# Patient Record
Sex: Female | Born: 1980 | Race: Black or African American | Hispanic: No | Marital: Single | Smoking: Never smoker
Health system: Southern US, Community
[De-identification: ages and names within clinical notes are randomized; demographics above are authoritative.]

## PROBLEM LIST (undated history)

## (undated) DIAGNOSIS — F329 Major depressive disorder, single episode, unspecified: Secondary | ICD-10-CM

## (undated) DIAGNOSIS — F988 Other specified behavioral and emotional disorders with onset usually occurring in childhood and adolescence: Secondary | ICD-10-CM

## (undated) DIAGNOSIS — I639 Cerebral infarction, unspecified: Secondary | ICD-10-CM

## (undated) DIAGNOSIS — D649 Anemia, unspecified: Secondary | ICD-10-CM

## (undated) DIAGNOSIS — F32A Depression, unspecified: Secondary | ICD-10-CM

## (undated) DIAGNOSIS — Z9151 Personal history of suicidal behavior: Secondary | ICD-10-CM

## (undated) DIAGNOSIS — F909 Attention-deficit hyperactivity disorder, unspecified type: Secondary | ICD-10-CM

## (undated) DIAGNOSIS — R569 Unspecified convulsions: Secondary | ICD-10-CM

## (undated) DIAGNOSIS — Z915 Personal history of self-harm: Secondary | ICD-10-CM

## (undated) DIAGNOSIS — F4310 Post-traumatic stress disorder, unspecified: Secondary | ICD-10-CM

## (undated) DIAGNOSIS — F419 Anxiety disorder, unspecified: Secondary | ICD-10-CM

## (undated) HISTORY — DX: Cerebral infarction, unspecified: I63.9

## (undated) HISTORY — DX: Unspecified convulsions: R56.9

## (undated) HISTORY — DX: Attention-deficit hyperactivity disorder, unspecified type: F90.9

---

## 2016-02-09 ENCOUNTER — Emergency Department (HOSPITAL_COMMUNITY): Payer: Medicaid Other | Admitting: Anesthesiology

## 2016-02-09 ENCOUNTER — Inpatient Hospital Stay (HOSPITAL_COMMUNITY): Payer: Medicaid Other

## 2016-02-09 ENCOUNTER — Emergency Department (HOSPITAL_COMMUNITY): Payer: Medicaid Other

## 2016-02-09 ENCOUNTER — Encounter (HOSPITAL_COMMUNITY): Admission: EM | Disposition: A | Discharge status: Rehabilitation Facility | Payer: Self-pay | Source: Home / Self Care

## 2016-02-09 ENCOUNTER — Inpatient Hospital Stay (HOSPITAL_COMMUNITY)
Admission: EM | Admit: 2016-02-09 | Discharge: 2016-06-03 | DRG: 003 | Disposition: A | Discharge status: Rehabilitation Facility | Payer: Medicaid Other | Attending: Gastroenterology | Admitting: Gastroenterology

## 2016-02-09 ENCOUNTER — Encounter (HOSPITAL_COMMUNITY): Payer: Self-pay | Admitting: Emergency Medicine

## 2016-02-09 DIAGNOSIS — Y95 Nosocomial condition: Secondary | ICD-10-CM | POA: Diagnosis not present

## 2016-02-09 DIAGNOSIS — S3282XA Multiple fractures of pelvis without disruption of pelvic ring, initial encounter for closed fracture: Principal | ICD-10-CM | POA: Diagnosis present

## 2016-02-09 DIAGNOSIS — S36409A Unspecified injury of unspecified part of small intestine, initial encounter: Secondary | ICD-10-CM | POA: Diagnosis present

## 2016-02-09 DIAGNOSIS — J90 Pleural effusion, not elsewhere classified: Secondary | ICD-10-CM | POA: Diagnosis not present

## 2016-02-09 DIAGNOSIS — J9811 Atelectasis: Secondary | ICD-10-CM | POA: Diagnosis not present

## 2016-02-09 DIAGNOSIS — N179 Acute kidney failure, unspecified: Secondary | ICD-10-CM | POA: Diagnosis present

## 2016-02-09 DIAGNOSIS — T5491XA Toxic effect of unspecified corrosive substance, accidental (unintentional), initial encounter: Secondary | ICD-10-CM | POA: Diagnosis present

## 2016-02-09 DIAGNOSIS — Y9259 Other trade areas as the place of occurrence of the external cause: Secondary | ICD-10-CM

## 2016-02-09 DIAGNOSIS — S3282XS Multiple fractures of pelvis without disruption of pelvic ring, sequela: Secondary | ICD-10-CM | POA: Diagnosis not present

## 2016-02-09 DIAGNOSIS — Z6824 Body mass index (BMI) 24.0-24.9, adult: Secondary | ICD-10-CM

## 2016-02-09 DIAGNOSIS — S79912A Unspecified injury of left hip, initial encounter: Secondary | ICD-10-CM

## 2016-02-09 DIAGNOSIS — S90511A Abrasion, right ankle, initial encounter: Secondary | ICD-10-CM | POA: Diagnosis present

## 2016-02-09 DIAGNOSIS — T1490XA Injury, unspecified, initial encounter: Secondary | ICD-10-CM

## 2016-02-09 DIAGNOSIS — S2241XA Multiple fractures of ribs, right side, initial encounter for closed fracture: Secondary | ICD-10-CM | POA: Diagnosis present

## 2016-02-09 DIAGNOSIS — Z9289 Personal history of other medical treatment: Secondary | ICD-10-CM

## 2016-02-09 DIAGNOSIS — S271XXA Traumatic hemothorax, initial encounter: Secondary | ICD-10-CM | POA: Diagnosis present

## 2016-02-09 DIAGNOSIS — I6340 Cerebral infarction due to embolism of unspecified cerebral artery: Secondary | ICD-10-CM

## 2016-02-09 DIAGNOSIS — Z9189 Other specified personal risk factors, not elsewhere classified: Secondary | ICD-10-CM | POA: Diagnosis not present

## 2016-02-09 DIAGNOSIS — S2242XA Multiple fractures of ribs, left side, initial encounter for closed fracture: Secondary | ICD-10-CM | POA: Diagnosis present

## 2016-02-09 DIAGNOSIS — M79671 Pain in right foot: Secondary | ICD-10-CM

## 2016-02-09 DIAGNOSIS — T797XXA Traumatic subcutaneous emphysema, initial encounter: Secondary | ICD-10-CM

## 2016-02-09 DIAGNOSIS — S069X3S Unspecified intracranial injury with loss of consciousness of 1 hour to 5 hours 59 minutes, sequela: Secondary | ICD-10-CM | POA: Diagnosis not present

## 2016-02-09 DIAGNOSIS — S32810A Multiple fractures of pelvis with stable disruption of pelvic ring, initial encounter for closed fracture: Secondary | ICD-10-CM

## 2016-02-09 DIAGNOSIS — I272 Other secondary pulmonary hypertension: Secondary | ICD-10-CM | POA: Diagnosis not present

## 2016-02-09 DIAGNOSIS — X80XXXA Intentional self-harm by jumping from a high place, initial encounter: Secondary | ICD-10-CM | POA: Diagnosis present

## 2016-02-09 DIAGNOSIS — M21379 Foot drop, unspecified foot: Secondary | ICD-10-CM | POA: Diagnosis not present

## 2016-02-09 DIAGNOSIS — S62102A Fracture of unspecified carpal bone, left wrist, initial encounter for closed fracture: Secondary | ICD-10-CM | POA: Diagnosis present

## 2016-02-09 DIAGNOSIS — S62002A Unspecified fracture of navicular [scaphoid] bone of left wrist, initial encounter for closed fracture: Secondary | ICD-10-CM | POA: Diagnosis present

## 2016-02-09 DIAGNOSIS — Z4682 Encounter for fitting and adjustment of non-vascular catheter: Secondary | ICD-10-CM

## 2016-02-09 DIAGNOSIS — B999 Unspecified infectious disease: Secondary | ICD-10-CM

## 2016-02-09 DIAGNOSIS — R4182 Altered mental status, unspecified: Secondary | ICD-10-CM

## 2016-02-09 DIAGNOSIS — E876 Hypokalemia: Secondary | ICD-10-CM | POA: Diagnosis not present

## 2016-02-09 DIAGNOSIS — Z87891 Personal history of nicotine dependence: Secondary | ICD-10-CM

## 2016-02-09 DIAGNOSIS — D6862 Lupus anticoagulant syndrome: Secondary | ICD-10-CM | POA: Diagnosis present

## 2016-02-09 DIAGNOSIS — J9601 Acute respiratory failure with hypoxia: Secondary | ICD-10-CM | POA: Diagnosis not present

## 2016-02-09 DIAGNOSIS — R739 Hyperglycemia, unspecified: Secondary | ICD-10-CM | POA: Diagnosis present

## 2016-02-09 DIAGNOSIS — D6489 Other specified anemias: Secondary | ICD-10-CM | POA: Diagnosis present

## 2016-02-09 DIAGNOSIS — I619 Nontraumatic intracerebral hemorrhage, unspecified: Secondary | ICD-10-CM

## 2016-02-09 DIAGNOSIS — M79604 Pain in right leg: Secondary | ICD-10-CM

## 2016-02-09 DIAGNOSIS — Z09 Encounter for follow-up examination after completed treatment for conditions other than malignant neoplasm: Secondary | ICD-10-CM

## 2016-02-09 DIAGNOSIS — J189 Pneumonia, unspecified organism: Secondary | ICD-10-CM | POA: Diagnosis not present

## 2016-02-09 DIAGNOSIS — T1491XA Suicide attempt, initial encounter: Secondary | ICD-10-CM | POA: Diagnosis present

## 2016-02-09 DIAGNOSIS — E872 Acidosis: Secondary | ICD-10-CM | POA: Diagnosis not present

## 2016-02-09 DIAGNOSIS — W19XXXA Unspecified fall, initial encounter: Secondary | ICD-10-CM

## 2016-02-09 DIAGNOSIS — F332 Major depressive disorder, recurrent severe without psychotic features: Secondary | ICD-10-CM | POA: Diagnosis present

## 2016-02-09 DIAGNOSIS — D696 Thrombocytopenia, unspecified: Secondary | ICD-10-CM | POA: Diagnosis present

## 2016-02-09 DIAGNOSIS — G0490 Encephalitis and encephalomyelitis, unspecified: Secondary | ICD-10-CM

## 2016-02-09 DIAGNOSIS — J9690 Respiratory failure, unspecified, unspecified whether with hypoxia or hypercapnia: Secondary | ICD-10-CM

## 2016-02-09 DIAGNOSIS — D62 Acute posthemorrhagic anemia: Secondary | ICD-10-CM | POA: Diagnosis present

## 2016-02-09 DIAGNOSIS — S329XXA Fracture of unspecified parts of lumbosacral spine and pelvis, initial encounter for closed fracture: Secondary | ICD-10-CM

## 2016-02-09 DIAGNOSIS — S2231XA Fracture of one rib, right side, initial encounter for closed fracture: Secondary | ICD-10-CM

## 2016-02-09 DIAGNOSIS — W139XXA Fall from, out of or through building, not otherwise specified, initial encounter: Secondary | ICD-10-CM | POA: Diagnosis not present

## 2016-02-09 DIAGNOSIS — S51812A Laceration without foreign body of left forearm, initial encounter: Secondary | ICD-10-CM | POA: Diagnosis present

## 2016-02-09 DIAGNOSIS — I639 Cerebral infarction, unspecified: Secondary | ICD-10-CM | POA: Diagnosis not present

## 2016-02-09 DIAGNOSIS — T148XXA Other injury of unspecified body region, initial encounter: Secondary | ICD-10-CM

## 2016-02-09 DIAGNOSIS — K5900 Constipation, unspecified: Secondary | ICD-10-CM | POA: Diagnosis not present

## 2016-02-09 DIAGNOSIS — Z91410 Personal history of adult physical and sexual abuse: Secondary | ICD-10-CM

## 2016-02-09 DIAGNOSIS — F4310 Post-traumatic stress disorder, unspecified: Secondary | ICD-10-CM | POA: Diagnosis present

## 2016-02-09 DIAGNOSIS — I959 Hypotension, unspecified: Secondary | ICD-10-CM

## 2016-02-09 DIAGNOSIS — R319 Hematuria, unspecified: Secondary | ICD-10-CM | POA: Diagnosis present

## 2016-02-09 DIAGNOSIS — S36893A Laceration of other intra-abdominal organs, initial encounter: Secondary | ICD-10-CM | POA: Diagnosis present

## 2016-02-09 DIAGNOSIS — I63131 Cerebral infarction due to embolism of right carotid artery: Secondary | ICD-10-CM | POA: Diagnosis not present

## 2016-02-09 DIAGNOSIS — S27331A Laceration of lung, unilateral, initial encounter: Secondary | ICD-10-CM | POA: Diagnosis present

## 2016-02-09 DIAGNOSIS — J869 Pyothorax without fistula: Secondary | ICD-10-CM

## 2016-02-09 DIAGNOSIS — I472 Ventricular tachycardia: Secondary | ICD-10-CM | POA: Diagnosis not present

## 2016-02-09 DIAGNOSIS — S36116A Major laceration of liver, initial encounter: Secondary | ICD-10-CM | POA: Diagnosis present

## 2016-02-09 DIAGNOSIS — S62315A Displaced fracture of base of fourth metacarpal bone, left hand, initial encounter for closed fracture: Secondary | ICD-10-CM | POA: Diagnosis present

## 2016-02-09 DIAGNOSIS — M25471 Effusion, right ankle: Secondary | ICD-10-CM

## 2016-02-09 DIAGNOSIS — J156 Pneumonia due to other aerobic Gram-negative bacteria: Secondary | ICD-10-CM | POA: Diagnosis not present

## 2016-02-09 DIAGNOSIS — N170 Acute kidney failure with tubular necrosis: Secondary | ICD-10-CM | POA: Diagnosis not present

## 2016-02-09 DIAGNOSIS — R571 Hypovolemic shock: Secondary | ICD-10-CM | POA: Diagnosis present

## 2016-02-09 DIAGNOSIS — E870 Hyperosmolality and hypernatremia: Secondary | ICD-10-CM | POA: Diagnosis not present

## 2016-02-09 DIAGNOSIS — E669 Obesity, unspecified: Secondary | ICD-10-CM | POA: Diagnosis present

## 2016-02-09 DIAGNOSIS — K259 Gastric ulcer, unspecified as acute or chronic, without hemorrhage or perforation: Secondary | ICD-10-CM | POA: Diagnosis present

## 2016-02-09 DIAGNOSIS — R531 Weakness: Secondary | ICD-10-CM

## 2016-02-09 DIAGNOSIS — R1310 Dysphagia, unspecified: Secondary | ICD-10-CM | POA: Diagnosis not present

## 2016-02-09 DIAGNOSIS — T391X1A Poisoning by 4-Aminophenol derivatives, accidental (unintentional), initial encounter: Secondary | ICD-10-CM | POA: Diagnosis present

## 2016-02-09 DIAGNOSIS — T65892A Toxic effect of other specified substances, intentional self-harm, initial encounter: Secondary | ICD-10-CM | POA: Diagnosis present

## 2016-02-09 DIAGNOSIS — Y900 Blood alcohol level of less than 20 mg/100 ml: Secondary | ICD-10-CM | POA: Diagnosis present

## 2016-02-09 DIAGNOSIS — Z9889 Other specified postprocedural states: Secondary | ICD-10-CM | POA: Diagnosis not present

## 2016-02-09 DIAGNOSIS — S299XXA Unspecified injury of thorax, initial encounter: Secondary | ICD-10-CM

## 2016-02-09 DIAGNOSIS — J939 Pneumothorax, unspecified: Secondary | ICD-10-CM

## 2016-02-09 DIAGNOSIS — I638 Other cerebral infarction: Secondary | ICD-10-CM | POA: Diagnosis not present

## 2016-02-09 DIAGNOSIS — I63411 Cerebral infarction due to embolism of right middle cerebral artery: Secondary | ICD-10-CM | POA: Diagnosis not present

## 2016-02-09 DIAGNOSIS — L8990 Pressure ulcer of unspecified site, unspecified stage: Secondary | ICD-10-CM | POA: Diagnosis not present

## 2016-02-09 DIAGNOSIS — Z978 Presence of other specified devices: Secondary | ICD-10-CM

## 2016-02-09 DIAGNOSIS — R600 Localized edema: Secondary | ICD-10-CM | POA: Diagnosis not present

## 2016-02-09 DIAGNOSIS — R0902 Hypoxemia: Secondary | ICD-10-CM

## 2016-02-09 DIAGNOSIS — S2249XA Multiple fractures of ribs, unspecified side, initial encounter for closed fracture: Secondary | ICD-10-CM

## 2016-02-09 DIAGNOSIS — I63412 Cerebral infarction due to embolism of left middle cerebral artery: Secondary | ICD-10-CM | POA: Diagnosis not present

## 2016-02-09 DIAGNOSIS — Z9911 Dependence on respirator [ventilator] status: Secondary | ICD-10-CM

## 2016-02-09 DIAGNOSIS — R4020 Unspecified coma: Secondary | ICD-10-CM | POA: Diagnosis not present

## 2016-02-09 DIAGNOSIS — T1491 Suicide attempt: Secondary | ICD-10-CM | POA: Diagnosis not present

## 2016-02-09 DIAGNOSIS — G40901 Epilepsy, unspecified, not intractable, with status epilepticus: Secondary | ICD-10-CM | POA: Diagnosis not present

## 2016-02-09 DIAGNOSIS — R001 Bradycardia, unspecified: Secondary | ICD-10-CM

## 2016-02-09 DIAGNOSIS — T391X2A Poisoning by 4-Aminophenol derivatives, intentional self-harm, initial encounter: Secondary | ICD-10-CM | POA: Diagnosis present

## 2016-02-09 DIAGNOSIS — S36113A Laceration of liver, unspecified degree, initial encounter: Secondary | ICD-10-CM | POA: Diagnosis present

## 2016-02-09 DIAGNOSIS — D689 Coagulation defect, unspecified: Secondary | ICD-10-CM | POA: Diagnosis present

## 2016-02-09 DIAGNOSIS — E2740 Unspecified adrenocortical insufficiency: Secondary | ICD-10-CM | POA: Diagnosis present

## 2016-02-09 DIAGNOSIS — J95811 Postprocedural pneumothorax: Secondary | ICD-10-CM | POA: Diagnosis not present

## 2016-02-09 DIAGNOSIS — E8770 Fluid overload, unspecified: Secondary | ICD-10-CM | POA: Diagnosis not present

## 2016-02-09 DIAGNOSIS — I63421 Cerebral infarction due to embolism of right anterior cerebral artery: Secondary | ICD-10-CM | POA: Diagnosis not present

## 2016-02-09 DIAGNOSIS — R569 Unspecified convulsions: Secondary | ICD-10-CM | POA: Diagnosis not present

## 2016-02-09 DIAGNOSIS — R0602 Shortness of breath: Secondary | ICD-10-CM

## 2016-02-09 DIAGNOSIS — S3210XA Unspecified fracture of sacrum, initial encounter for closed fracture: Secondary | ICD-10-CM | POA: Diagnosis present

## 2016-02-09 DIAGNOSIS — S27322A Contusion of lung, bilateral, initial encounter: Secondary | ICD-10-CM | POA: Diagnosis present

## 2016-02-09 DIAGNOSIS — R45851 Suicidal ideations: Secondary | ICD-10-CM | POA: Diagnosis not present

## 2016-02-09 DIAGNOSIS — E785 Hyperlipidemia, unspecified: Secondary | ICD-10-CM | POA: Diagnosis not present

## 2016-02-09 DIAGNOSIS — S272XXA Traumatic hemopneumothorax, initial encounter: Secondary | ICD-10-CM

## 2016-02-09 DIAGNOSIS — G8194 Hemiplegia, unspecified affecting left nondominant side: Secondary | ICD-10-CM | POA: Diagnosis not present

## 2016-02-09 DIAGNOSIS — I10 Essential (primary) hypertension: Secondary | ICD-10-CM | POA: Diagnosis not present

## 2016-02-09 DIAGNOSIS — J9600 Acute respiratory failure, unspecified whether with hypoxia or hypercapnia: Secondary | ICD-10-CM | POA: Diagnosis present

## 2016-02-09 DIAGNOSIS — L89899 Pressure ulcer of other site, unspecified stage: Secondary | ICD-10-CM | POA: Diagnosis not present

## 2016-02-09 DIAGNOSIS — X789XXA Intentional self-harm by unspecified sharp object, initial encounter: Secondary | ICD-10-CM | POA: Diagnosis present

## 2016-02-09 DIAGNOSIS — Z931 Gastrostomy status: Secondary | ICD-10-CM

## 2016-02-09 DIAGNOSIS — Z9689 Presence of other specified functional implants: Secondary | ICD-10-CM

## 2016-02-09 DIAGNOSIS — I63139 Cerebral infarction due to embolism of unspecified carotid artery: Secondary | ICD-10-CM | POA: Diagnosis not present

## 2016-02-09 DIAGNOSIS — K7290 Hepatic failure, unspecified without coma: Secondary | ICD-10-CM | POA: Diagnosis present

## 2016-02-09 DIAGNOSIS — M79606 Pain in leg, unspecified: Secondary | ICD-10-CM

## 2016-02-09 DIAGNOSIS — N280 Ischemia and infarction of kidney: Secondary | ICD-10-CM | POA: Diagnosis present

## 2016-02-09 DIAGNOSIS — J181 Lobar pneumonia, unspecified organism: Secondary | ICD-10-CM

## 2016-02-09 DIAGNOSIS — J949 Pleural condition, unspecified: Secondary | ICD-10-CM

## 2016-02-09 DIAGNOSIS — L02213 Cutaneous abscess of chest wall: Secondary | ICD-10-CM | POA: Diagnosis not present

## 2016-02-09 DIAGNOSIS — Z6839 Body mass index (BMI) 39.0-39.9, adult: Secondary | ICD-10-CM

## 2016-02-09 DIAGNOSIS — T5494XA Toxic effect of unspecified corrosive substance, undetermined, initial encounter: Secondary | ICD-10-CM | POA: Diagnosis not present

## 2016-02-09 DIAGNOSIS — S62009A Unspecified fracture of navicular [scaphoid] bone of unspecified wrist, initial encounter for closed fracture: Secondary | ICD-10-CM

## 2016-02-09 DIAGNOSIS — R4701 Aphasia: Secondary | ICD-10-CM | POA: Diagnosis not present

## 2016-02-09 DIAGNOSIS — T5492XA Toxic effect of unspecified corrosive substance, intentional self-harm, initial encounter: Secondary | ICD-10-CM | POA: Diagnosis not present

## 2016-02-09 DIAGNOSIS — J942 Hemothorax: Secondary | ICD-10-CM

## 2016-02-09 DIAGNOSIS — T543X2A Toxic effect of corrosive alkalis and alkali-like substances, intentional self-harm, initial encounter: Secondary | ICD-10-CM | POA: Diagnosis present

## 2016-02-09 HISTORY — DX: Depression, unspecified: F32.A

## 2016-02-09 HISTORY — PX: CHOLECYSTECTOMY: SHX55

## 2016-02-09 HISTORY — PX: EXTERNAL FIXATION PELVIS: SHX1551

## 2016-02-09 HISTORY — DX: Anxiety disorder, unspecified: F41.9

## 2016-02-09 HISTORY — DX: Post-traumatic stress disorder, unspecified: F43.10

## 2016-02-09 HISTORY — DX: Other specified behavioral and emotional disorders with onset usually occurring in childhood and adolescence: F98.8

## 2016-02-09 HISTORY — DX: Anemia, unspecified: D64.9

## 2016-02-09 HISTORY — PX: LACERATION REPAIR: SHX5284

## 2016-02-09 HISTORY — DX: Personal history of self-harm: Z91.5

## 2016-02-09 HISTORY — DX: Personal history of suicidal behavior: Z91.51

## 2016-02-09 HISTORY — PX: LAPAROTOMY: SHX154

## 2016-02-09 HISTORY — DX: Major depressive disorder, single episode, unspecified: F32.9

## 2016-02-09 HISTORY — PX: BOWEL RESECTION: SHX1257

## 2016-02-09 HISTORY — PX: APPLICATION OF WOUND VAC: SHX5189

## 2016-02-09 LAB — CBC
HCT: 27.6 % — ABNORMAL LOW (ref 36.0–46.0)
HEMATOCRIT: 10.7 % — AB (ref 36.0–46.0)
HEMATOCRIT: 11.6 % — AB (ref 36.0–46.0)
HEMOGLOBIN: 8 g/dL — AB (ref 12.0–15.0)
HEMOGLOBIN: 3.6 g/dL — AB (ref 12.0–15.0)
HEMOGLOBIN: 3.8 g/dL — AB (ref 12.0–15.0)
MCH: 21.6 pg — AB (ref 26.0–34.0)
MCH: 27.1 pg (ref 26.0–34.0)
MCH: 27.9 pg (ref 26.0–34.0)
MCHC: 29 g/dL — AB (ref 30.0–36.0)
MCHC: 32.8 g/dL (ref 30.0–36.0)
MCHC: 33.6 g/dL (ref 30.0–36.0)
MCV: 74.4 fL — ABNORMAL LOW (ref 78.0–100.0)
MCV: 82.9 fL (ref 78.0–100.0)
MCV: 82.9 fL (ref 78.0–100.0)
PLATELETS: 59 10*3/uL — AB (ref 150–400)
Platelets: 307 10*3/uL (ref 150–400)
Platelets: 78 10*3/uL — ABNORMAL LOW (ref 150–400)
RBC: 3.71 MIL/uL — ABNORMAL LOW (ref 3.87–5.11)
RBC: 1.29 MIL/uL — AB (ref 3.87–5.11)
RBC: 1.4 MIL/uL — AB (ref 3.87–5.11)
RDW: 15.5 % (ref 11.5–15.5)
RDW: 14.4 % (ref 11.5–15.5)
RDW: 14.5 % (ref 11.5–15.5)
WBC: 11 10*3/uL — ABNORMAL HIGH (ref 4.0–10.5)
WBC: 3.1 10*3/uL — ABNORMAL LOW (ref 4.0–10.5)
WBC: 3.5 10*3/uL — ABNORMAL LOW (ref 4.0–10.5)

## 2016-02-09 LAB — DIC (DISSEMINATED INTRAVASCULAR COAGULATION) PANEL
APTT: 80 s — AB (ref 24–37)
FIBRINOGEN: 143 mg/dL — AB (ref 204–475)
PLATELETS: 87 10*3/uL — AB (ref 150–400)
SMEAR REVIEW: NONE SEEN

## 2016-02-09 LAB — POCT I-STAT 4, (NA,K, GLUC, HGB,HCT)
GLUCOSE: 368 mg/dL — AB (ref 65–99)
HCT: 21 % — ABNORMAL LOW (ref 36.0–46.0)
Hemoglobin: 7.1 g/dL — ABNORMAL LOW (ref 12.0–15.0)
Potassium: 5.2 mmol/L — ABNORMAL HIGH (ref 3.5–5.1)
SODIUM: 146 mmol/L — AB (ref 135–145)

## 2016-02-09 LAB — COMPREHENSIVE METABOLIC PANEL
ALK PHOS: 43 U/L (ref 38–126)
ALK PHOS: 35 U/L — AB (ref 38–126)
ALT: 456 U/L — AB (ref 14–54)
ALT: 144 U/L — ABNORMAL HIGH (ref 14–54)
ANION GAP: 12 (ref 5–15)
AST: 595 U/L — ABNORMAL HIGH (ref 15–41)
AST: 208 U/L — ABNORMAL HIGH (ref 15–41)
Albumin: 3.2 g/dL — ABNORMAL LOW (ref 3.5–5.0)
Albumin: 2.1 g/dL — ABNORMAL LOW (ref 3.5–5.0)
Anion gap: 22 — ABNORMAL HIGH (ref 5–15)
BUN: 6 mg/dL (ref 6–20)
CALCIUM: 8.3 mg/dL — AB (ref 8.9–10.3)
CALCIUM: 6.6 mg/dL — AB (ref 8.9–10.3)
CHLORIDE: 116 mmol/L — AB (ref 101–111)
CO2: 10 mmol/L — AB (ref 22–32)
CO2: 18 mmol/L — AB (ref 22–32)
CREATININE: 1.15 mg/dL — AB (ref 0.44–1.00)
Chloride: 106 mmol/L (ref 101–111)
Creatinine, Ser: 0.98 mg/dL (ref 0.44–1.00)
GFR calc Af Amer: >60 mL/min (ref >60)
GFR calc non Af Amer: >60 mL/min (ref >60)
GFR calc non Af Amer: >60 mL/min (ref >60)
GLUCOSE: 324 mg/dL — AB (ref 65–99)
Glucose, Bld: 248 mg/dL — ABNORMAL HIGH (ref 65–99)
POTASSIUM: 4.2 mmol/L (ref 3.5–5.1)
Potassium: 5 mmol/L (ref 3.5–5.1)
SODIUM: 146 mmol/L — AB (ref 135–145)
Sodium: 138 mmol/L (ref 135–145)
Total Bilirubin: 0.7 mg/dL (ref 0.3–1.2)
Total Bilirubin: 0.6 mg/dL (ref 0.3–1.2)
Total Protein: 5.9 g/dL — ABNORMAL LOW (ref 6.5–8.1)
Total Protein: 3.8 g/dL — ABNORMAL LOW (ref 6.5–8.1)

## 2016-02-09 LAB — PROTIME-INR
INR: 1.6 — ABNORMAL HIGH (ref 0.00–1.49)
INR: 1.99 — ABNORMAL HIGH (ref 0.00–1.49)
INR: 2.07 — ABNORMAL HIGH (ref 0.00–1.49)
INR: 1.83 — ABNORMAL HIGH (ref 0.00–1.49)
INR: 2 — ABNORMAL HIGH (ref 0.00–1.49)
Prothrombin Time: 19.1 seconds — ABNORMAL HIGH (ref 11.6–15.2)
Prothrombin Time: 22.5 seconds — ABNORMAL HIGH (ref 11.6–15.2)
Prothrombin Time: 23.1 seconds — ABNORMAL HIGH (ref 11.6–15.2)
Prothrombin Time: 21.1 seconds — ABNORMAL HIGH (ref 11.6–15.2)
Prothrombin Time: 22.5 seconds — ABNORMAL HIGH (ref 11.6–15.2)

## 2016-02-09 LAB — BLOOD GAS, ARTERIAL
Acid-base deficit: 9.6 mmol/L — ABNORMAL HIGH (ref 0.0–2.0)
Acid-base deficit: 8.2 mmol/L — ABNORMAL HIGH (ref 0.0–2.0)
Bicarbonate: 18.8 mEq/L — ABNORMAL LOW (ref 20.0–24.0)
Bicarbonate: 18.9 mEq/L — ABNORMAL LOW (ref 20.0–24.0)
DRAWN BY: 280981
Drawn by: 280981
FIO2: 0.9
FIO2: 0.8
MECHVT: 550 mL
MECHVT: 550 mL
O2 SAT: 98.6 %
O2 SAT: 98.7 %
PATIENT TEMPERATURE: 98.6
PATIENT TEMPERATURE: 98.6
PCO2 ART: 65.5 mmHg — AB (ref 35.0–45.0)
PCO2 ART: 53.2 mmHg — AB (ref 35.0–45.0)
PEEP: 5 cmH2O
PEEP: 5 cmH2O
PH ART: 7.175 — AB (ref 7.350–7.450)
PO2 ART: 145 mmHg — AB (ref 80.0–100.0)
RATE: 16 resp/min
RATE: 22 resp/min
TCO2: 20.8 mmol/L (ref 0–100)
TCO2: 20.5 mmol/L (ref 0–100)
pH, Arterial: 7.086 — CL (ref 7.350–7.450)
pO2, Arterial: 129 mmHg — ABNORMAL HIGH (ref 80.0–100.0)

## 2016-02-09 LAB — I-STAT CHEM 8, ED
BUN: 4 mg/dL — AB (ref 6–20)
CREATININE: 1.2 mg/dL — AB (ref 0.44–1.00)
Calcium, Ion: 1.03 mmol/L — ABNORMAL LOW (ref 1.12–1.23)
Chloride: 107 mmol/L (ref 101–111)
GLUCOSE: 310 mg/dL — AB (ref 65–99)
HEMATOCRIT: 28 % — AB (ref 36.0–46.0)
Hemoglobin: 9.5 g/dL — ABNORMAL LOW (ref 12.0–15.0)
Potassium: 5 mmol/L (ref 3.5–5.1)
Sodium: 138 mmol/L (ref 135–145)
TCO2: 12 mmol/L (ref 0–100)

## 2016-02-09 LAB — HEMOGLOBIN AND HEMATOCRIT, BLOOD
HCT: 24.1 % — ABNORMAL LOW (ref 36.0–46.0)
HCT: 30.4 % — ABNORMAL LOW (ref 36.0–46.0)
HEMATOCRIT: 30.7 % — AB (ref 36.0–46.0)
HEMOGLOBIN: 9.9 g/dL — AB (ref 12.0–15.0)
Hemoglobin: 7.8 g/dL — ABNORMAL LOW (ref 12.0–15.0)
Hemoglobin: 10 g/dL — ABNORMAL LOW (ref 12.0–15.0)

## 2016-02-09 LAB — POCT I-STAT 3, ART BLOOD GAS (G3+)
ACID-BASE DEFICIT: 9 mmol/L — AB (ref 0.0–2.0)
Bicarbonate: 19.3 mEq/L — ABNORMAL LOW (ref 20.0–24.0)
O2 Saturation: 76 %
PCO2 ART: 59.7 mmHg — AB (ref 35.0–45.0)
PO2 ART: 56 mmHg — AB (ref 80.0–100.0)
Patient temperature: 37.4
TCO2: 21 mmol/L (ref 0–100)
pH, Arterial: 7.119 — CL (ref 7.350–7.450)

## 2016-02-09 LAB — POCT I-STAT 7, (LYTES, BLD GAS, ICA,H+H)
ACID-BASE DEFICIT: 23 mmol/L — AB (ref 0.0–2.0)
BICARBONATE: 8.3 meq/L — AB (ref 20.0–24.0)
CALCIUM ION: 0.71 mmol/L — AB (ref 1.12–1.23)
HCT: 17 % — ABNORMAL LOW (ref 36.0–46.0)
Hemoglobin: 5.8 g/dL — CL (ref 12.0–15.0)
O2 SAT: 96 %
PCO2 ART: 44.1 mmHg (ref 35.0–45.0)
PH ART: 6.863 — AB (ref 7.350–7.450)
PO2 ART: 131 mmHg — AB (ref 80.0–100.0)
Potassium: 4.3 mmol/L (ref 3.5–5.1)
Sodium: 142 mmol/L (ref 135–145)
TCO2: 10 mmol/L (ref 0–100)

## 2016-02-09 LAB — DIC (DISSEMINATED INTRAVASCULAR COAGULATION)PANEL
D-Dimer, Quant: 13.37 ug/mL-FEU — ABNORMAL HIGH (ref 0.00–0.50)
INR: 2.11 — ABNORMAL HIGH (ref 0.00–1.49)
Prothrombin Time: 23.5 seconds — ABNORMAL HIGH (ref 11.6–15.2)

## 2016-02-09 LAB — LACTIC ACID, PLASMA: LACTIC ACID, VENOUS: 5.7 mmol/L — AB (ref 0.5–2.0)

## 2016-02-09 LAB — MASSIVE TRANSFUSION PROTOCOL ORDER (BLOOD BANK NOTIFICATION)

## 2016-02-09 LAB — FIBRINOGEN
FIBRINOGEN: 199 mg/dL — AB (ref 204–475)
FIBRINOGEN: 234 mg/dL (ref 204–475)
Fibrinogen: 168 mg/dL — ABNORMAL LOW (ref 204–475)

## 2016-02-09 LAB — ABO/RH: ABO/RH(D): O POS

## 2016-02-09 LAB — PREPARE RBC (CROSSMATCH)

## 2016-02-09 LAB — PLATELET COUNT: PLATELETS: 87 10*3/uL — AB (ref 150–400)

## 2016-02-09 LAB — CDS SEROLOGY

## 2016-02-09 LAB — APTT: aPTT: 63 seconds — ABNORMAL HIGH (ref 24–37)

## 2016-02-09 LAB — ETHANOL: ALCOHOL ETHYL (B): 6 mg/dL — AB (ref <5)

## 2016-02-09 LAB — TRIGLYCERIDES: TRIGLYCERIDES: 91 mg/dL (ref <150)

## 2016-02-09 SURGERY — LAPAROTOMY, EXPLORATORY
Anesthesia: General | Site: Pelvis

## 2016-02-09 MED ORDER — SODIUM CHLORIDE 0.9 % IV SOLN: Freq: Once | INTRAVENOUS | Status: DC

## 2016-02-09 MED ORDER — PANTOPRAZOLE SODIUM 40 MG IV SOLR
40.0000 mg | Freq: Every day | INTRAVENOUS | Status: DC
  Administered 2016-02-09 – 2016-03-19 (×39): 40 mg via INTRAVENOUS
  Filled 2016-02-09 (×40): qty 40

## 2016-02-09 MED ORDER — SODIUM CHLORIDE 0.9 % IV BOLUS (SEPSIS)
1000.0000 mL | Freq: Once | INTRAVENOUS | Status: AC
  Administered 2016-02-09: 1000 mL via INTRAVENOUS

## 2016-02-09 MED ORDER — SODIUM CHLORIDE 0.9 % IV SOLN
INTRAVENOUS | Status: DC | PRN
Start: 2016-02-09 — End: 2016-02-09
  Administered 2016-02-09 (×3): via INTRAVENOUS

## 2016-02-09 MED ORDER — PANTOPRAZOLE SODIUM 40 MG PO TBEC
40.0000 mg | DELAYED_RELEASE_TABLET | Freq: Every day | ORAL | Status: DC
  Administered 2016-03-12 – 2016-03-21 (×3): 40 mg via ORAL
  Filled 2016-02-09 (×4): qty 1

## 2016-02-09 MED ORDER — ONDANSETRON HCL 4 MG PO TABS: 4.0000 mg | ORAL_TABLET | Freq: Four times a day (QID) | ORAL | Status: DC | PRN

## 2016-02-09 MED ORDER — LIDOCAINE-EPINEPHRINE (PF) 2 %-1:200000 IJ SOLN
INTRAMUSCULAR | Status: AC
  Filled 2016-02-09: qty 20

## 2016-02-09 MED ORDER — LACTATED RINGERS IV SOLN
INTRAVENOUS | Status: DC | PRN
  Administered 2016-02-09: 09:00:00 via INTRAVENOUS

## 2016-02-09 MED ORDER — CHLORHEXIDINE GLUCONATE 0.12% ORAL RINSE (MEDLINE KIT): 15.0000 mL | Freq: Two times a day (BID) | OROMUCOSAL | Status: DC

## 2016-02-09 MED ORDER — ETOMIDATE 2 MG/ML IV SOLN
INTRAVENOUS | Status: DC | PRN
  Administered 2016-02-09: 20 mg via INTRAVENOUS

## 2016-02-09 MED ORDER — DEXTROSE 5 % IV SOLN
0.0000 ug/min | INTRAVENOUS | Status: DC
  Filled 2016-02-09 (×2): qty 4

## 2016-02-09 MED ORDER — SODIUM CHLORIDE 0.9 % IV SOLN
INTRAVENOUS | Status: DC | PRN
  Administered 2016-02-09: 09:00:00 via INTRAVENOUS

## 2016-02-09 MED ORDER — EPINEPHRINE HCL 0.1 MG/ML IJ SOSY
PREFILLED_SYRINGE | INTRAMUSCULAR | Status: DC | PRN
Start: 2016-02-09 — End: 2016-02-09
  Administered 2016-02-09 (×2): 0.2 mg via INTRAVENOUS
  Administered 2016-02-09 (×2): 0.3 mg via INTRAVENOUS

## 2016-02-09 MED ORDER — ONDANSETRON HCL 4 MG/2ML IJ SOLN
4.0000 mg | Freq: Four times a day (QID) | INTRAMUSCULAR | Status: DC | PRN
  Administered 2016-02-12 – 2016-04-24 (×9): 4 mg via INTRAVENOUS
  Filled 2016-02-09 (×17): qty 2

## 2016-02-09 MED ORDER — IOPAMIDOL (ISOVUE-300) INJECTION 61%
INTRAVENOUS | Status: AC
  Administered 2016-02-09: 100 mL
  Filled 2016-02-09: qty 100

## 2016-02-09 MED ORDER — CHLORHEXIDINE GLUCONATE 0.12% ORAL RINSE (MEDLINE KIT)
15.0000 mL | Freq: Two times a day (BID) | OROMUCOSAL | Status: DC
  Administered 2016-02-09 – 2016-04-05 (×110): 15 mL via OROMUCOSAL

## 2016-02-09 MED ORDER — GELATIN ABSORBABLE 12-7 MM EX MISC
CUTANEOUS | Status: AC
  Filled 2016-02-09: qty 2

## 2016-02-09 MED ORDER — FENTANYL BOLUS VIA INFUSION
50.0000 ug | INTRAVENOUS | Status: DC | PRN
  Administered 2016-02-10 – 2016-03-07 (×16): 50 ug via INTRAVENOUS
  Filled 2016-02-09 (×3): qty 50

## 2016-02-09 MED ORDER — TRANEXAMIC ACID 1000 MG/10ML IV SOLN
1000.0000 mg | Freq: Once | INTRAVENOUS | Status: AC
  Administered 2016-02-09: 1000 mg via INTRAVENOUS
  Filled 2016-02-09 (×2): qty 10

## 2016-02-09 MED ORDER — ROCURONIUM BROMIDE 50 MG/5ML IV SOLN
INTRAVENOUS | Status: AC
  Filled 2016-02-09: qty 2

## 2016-02-09 MED ORDER — ANTISEPTIC ORAL RINSE SOLUTION (CORINZ)
7.0000 mL | OROMUCOSAL | Status: DC
  Administered 2016-02-09 – 2016-03-23 (×405): 7 mL via OROMUCOSAL

## 2016-02-09 MED ORDER — PHENYLEPHRINE HCL 10 MG/ML IJ SOLN
10.0000 mg | INTRAVENOUS | Status: DC | PRN
  Administered 2016-02-09: 100 ug/min via INTRAVENOUS

## 2016-02-09 MED ORDER — EPINEPHRINE HCL 1 MG/ML IJ SOLN
0.5000 ug/min | INTRAVENOUS | Status: DC
  Administered 2016-02-09: 3 ug/min via INTRAVENOUS
  Filled 2016-02-09 (×2): qty 4

## 2016-02-09 MED ORDER — 0.9 % SODIUM CHLORIDE (POUR BTL) OPTIME
TOPICAL | Status: DC | PRN
  Administered 2016-02-09: 2000 mL
  Administered 2016-02-09: 1000 mL

## 2016-02-09 MED ORDER — SODIUM CHLORIDE 0.9 % IV BOLUS (SEPSIS)
500.0000 mL | Freq: Once | INTRAVENOUS | Status: AC
  Administered 2016-02-09: 500 mL via INTRAVENOUS

## 2016-02-09 MED ORDER — NOREPINEPHRINE BITARTRATE 1 MG/ML IV SOLN
0.0000 ug/min | INTRAVENOUS | Status: AC
  Administered 2016-02-09: 4 ug/min via INTRAVENOUS
  Filled 2016-02-09 (×2): qty 4

## 2016-02-09 MED ORDER — VASOPRESSIN 20 UNIT/ML IV SOLN
0.0300 [IU]/min | INTRAVENOUS | Status: DC
  Administered 2016-02-09 (×2): .04 [IU]/min via INTRAVENOUS
  Filled 2016-02-09: qty 2

## 2016-02-09 MED ORDER — ALBUMIN HUMAN 5 % IV SOLN
INTRAVENOUS | Status: DC | PRN
Start: 2016-02-09 — End: 2016-02-09
  Administered 2016-02-09: 10:00:00 via INTRAVENOUS

## 2016-02-09 MED ORDER — SODIUM CHLORIDE 0.9 % IV SOLN
0.0300 [IU]/min | INTRAVENOUS | Status: DC
  Administered 2016-02-10 – 2016-02-14 (×4): 0.03 [IU]/min via INTRAVENOUS
  Filled 2016-02-09 (×8): qty 2

## 2016-02-09 MED ORDER — ANTISEPTIC ORAL RINSE SOLUTION (CORINZ): 7.0000 mL | Freq: Four times a day (QID) | OROMUCOSAL | Status: DC

## 2016-02-09 MED ORDER — CALCIUM CHLORIDE 10 % IV SOLN
INTRAVENOUS | Status: AC
  Filled 2016-02-09: qty 10

## 2016-02-09 MED ORDER — IOHEXOL 300 MG/ML  SOLN
100.0000 mL | Freq: Once | INTRAMUSCULAR | Status: AC | PRN
  Administered 2016-02-09: 60 mL via INTRAVENOUS

## 2016-02-09 MED ORDER — SODIUM BICARBONATE 8.4 % IV SOLN
INTRAVENOUS | Status: AC
  Filled 2016-02-09: qty 50

## 2016-02-09 MED ORDER — FENTANYL CITRATE (PF) 100 MCG/2ML IJ SOLN
50.0000 ug | Freq: Once | INTRAMUSCULAR | Status: AC
  Administered 2016-02-11: 50 ug via INTRAVENOUS
  Administered 2016-02-11: 100 ug via INTRAVENOUS
  Administered 2016-02-11 (×3): 50 ug via INTRAVENOUS
  Administered 2016-02-11 (×2): 100 ug via INTRAVENOUS

## 2016-02-09 MED ORDER — HEMOSTATIC AGENTS (NO CHARGE) OPTIME
TOPICAL | Status: DC | PRN
  Administered 2016-02-09: 2

## 2016-02-09 MED ORDER — SODIUM CHLORIDE 0.45 % IV SOLN
INTRAVENOUS | Status: DC
  Administered 2016-02-09 – 2016-02-13 (×7): via INTRAVENOUS

## 2016-02-09 MED ORDER — ROCURONIUM BROMIDE 100 MG/10ML IV SOLN
INTRAVENOUS | Status: DC | PRN
  Administered 2016-02-09 (×3): 50 mg via INTRAVENOUS

## 2016-02-09 MED ORDER — DEXTROSE 5 % IV SOLN
0.0000 ug/min | INTRAVENOUS | Status: DC
  Administered 2016-02-09: 40 ug/min via INTRAVENOUS
  Administered 2016-02-09: 35 ug/min via INTRAVENOUS
  Filled 2016-02-09 (×2): qty 4

## 2016-02-09 MED ORDER — ROCURONIUM BROMIDE 50 MG/5ML IV SOLN
INTRAVENOUS | Status: DC | PRN
  Administered 2016-02-09: 100 mg via INTRAVENOUS

## 2016-02-09 MED ORDER — CALCIUM CHLORIDE 10 % IV SOLN
INTRAVENOUS | Status: DC | PRN
  Administered 2016-02-09 (×3): 1 g via INTRAVENOUS

## 2016-02-09 MED ORDER — FENTANYL CITRATE (PF) 2500 MCG/50ML IJ SOLN
25.0000 ug/h | INTRAMUSCULAR | Status: DC
  Administered 2016-02-09 – 2016-02-13 (×3): 50 ug/h via INTRAVENOUS
  Administered 2016-02-16 – 2016-02-17 (×2): 100 ug/h via INTRAVENOUS
  Administered 2016-02-18: 125 ug/h via INTRAVENOUS
  Administered 2016-02-18: 150 ug/h via INTRAVENOUS
  Administered 2016-02-19: 200 ug/h via INTRAVENOUS
  Administered 2016-02-20: 250 ug/h via INTRAVENOUS
  Administered 2016-02-20: 200 ug/h via INTRAVENOUS
  Administered 2016-02-20 – 2016-02-21 (×3): 250 ug/h via INTRAVENOUS
  Administered 2016-02-22 (×2): 300 ug/h via INTRAVENOUS
  Administered 2016-02-23: 200 ug/h via INTRAVENOUS
  Administered 2016-02-23: 300 ug/h via INTRAVENOUS
  Administered 2016-02-23: 200 ug/h via INTRAVENOUS
  Administered 2016-02-23 – 2016-02-24 (×2): 300 ug/h via INTRAVENOUS
  Administered 2016-02-24: 250 ug/h via INTRAVENOUS
  Administered 2016-02-24: 300 ug/h via INTRAVENOUS
  Administered 2016-02-25: 250 ug/h via INTRAVENOUS
  Administered 2016-02-25: 275 ug/h via INTRAVENOUS
  Administered 2016-02-26: 175 ug/h via INTRAVENOUS
  Administered 2016-02-26 (×2): 200 ug/h via INTRAVENOUS
  Administered 2016-02-27 (×2): 125 ug/h via INTRAVENOUS
  Administered 2016-02-28 – 2016-02-29 (×2): 100 ug/h via INTRAVENOUS
  Administered 2016-02-29: 200 ug/h via INTRAVENOUS
  Administered 2016-03-01: 140 ug/h via INTRAVENOUS
  Administered 2016-03-01: 200 ug/h via INTRAVENOUS
  Administered 2016-03-02 – 2016-03-07 (×8): 150 ug/h via INTRAVENOUS
  Administered 2016-03-08: 100 ug/h via INTRAVENOUS
  Filled 2016-02-09 (×43): qty 50

## 2016-02-09 MED ORDER — PROPOFOL 1000 MG/100ML IV EMUL
0.0000 ug/kg/min | INTRAVENOUS | Status: DC
  Administered 2016-02-09: 10 ug/kg/min via INTRAVENOUS
  Filled 2016-02-09: qty 100

## 2016-02-09 MED ORDER — SODIUM CHLORIDE 0.9 % IV SOLN
INTRAVENOUS | Status: DC | PRN
  Administered 2016-02-09 (×2): 1000 mL via INTRAVENOUS

## 2016-02-09 MED ORDER — TRANEXAMIC ACID 1000 MG/10ML IV SOLN
1000.0000 mg | Freq: Once | INTRAVENOUS | Status: DC
  Filled 2016-02-09: qty 10

## 2016-02-09 MED ORDER — NOREPINEPHRINE BITARTRATE 1 MG/ML IV SOLN
0.0000 ug/min | INTRAVENOUS | Status: DC
  Administered 2016-02-09 – 2016-02-10 (×2): 40 ug/min via INTRAVENOUS
  Administered 2016-02-10: 30 ug/min via INTRAVENOUS
  Administered 2016-02-11 – 2016-02-13 (×6): 40 ug/min via INTRAVENOUS
  Administered 2016-02-13: 32 ug/min via INTRAVENOUS
  Filled 2016-02-09 (×15): qty 16

## 2016-02-09 MED ORDER — SODIUM BICARBONATE 8.4 % IV SOLN
INTRAVENOUS | Status: DC | PRN
  Administered 2016-02-09 (×3): 50 meq via INTRAVENOUS

## 2016-02-09 SURGICAL SUPPLY — 108 items
BANDAGE ELASTIC 4 VELCRO ST LF (GAUZE/BANDAGES/DRESSINGS) IMPLANT
BANDAGE ELASTIC 6 VELCRO ST LF (GAUZE/BANDAGES/DRESSINGS) IMPLANT
BANDAGE ESMARK 6X9 LF (GAUZE/BANDAGES/DRESSINGS) IMPLANT
BAR 380MM EXFIX CVD (EXFIX) ×1
BAR EXFIX CVD 11X380 (EXFIX) ×5 IMPLANT
BENZOIN TINCTURE PRP APPL 2/3 (GAUZE/BANDAGES/DRESSINGS) ×12 IMPLANT
BIT DRILL ORANGE 4.0 LONG (BIT) ×6 IMPLANT
BLADE SURG ROTATE 9660 (MISCELLANEOUS) IMPLANT
BNDG COHESIVE 6X5 TAN STRL LF (GAUZE/BANDAGES/DRESSINGS) IMPLANT
BNDG ESMARK 6X9 LF (GAUZE/BANDAGES/DRESSINGS)
BNDG GAUZE ELAST 4 BULKY (GAUZE/BANDAGES/DRESSINGS) ×6 IMPLANT
BRUSH SCRUB DISP (MISCELLANEOUS) IMPLANT
CANISTER SUCTION 2500CC (MISCELLANEOUS) IMPLANT
CANISTER WOUND CARE 500ML ATS (WOUND CARE) ×18 IMPLANT
CATH TROCAR 32FR (CATHETERS) ×6 IMPLANT
CHLORAPREP W/TINT 26ML (MISCELLANEOUS) ×6 IMPLANT
CLAMP BAR TO PIN (Clamp) ×12 IMPLANT
CLEANER TIP ELECTROSURG 2X2 (MISCELLANEOUS) ×6 IMPLANT
CLIP TI LARGE 6 (CLIP) ×6 IMPLANT
CLIP TI MEDIUM 6 (CLIP) ×6 IMPLANT
CLOSURE WOUND 1/2 X4 (GAUZE/BANDAGES/DRESSINGS)
CONT SPEC STER OR (MISCELLANEOUS) ×6 IMPLANT
COVER SURGICAL LIGHT HANDLE (MISCELLANEOUS) ×12 IMPLANT
CUFF TOURNIQUET SINGLE 18IN (TOURNIQUET CUFF) IMPLANT
CUFF TOURNIQUET SINGLE 24IN (TOURNIQUET CUFF) IMPLANT
CUFF TOURNIQUET SINGLE 34IN LL (TOURNIQUET CUFF) IMPLANT
DRAPE C-ARM 42X72 X-RAY (DRAPES) IMPLANT
DRAPE C-ARMOR (DRAPES) ×12 IMPLANT
DRAPE INCISE IOBAN 66X45 STRL (DRAPES) ×6 IMPLANT
DRAPE LAPAROSCOPIC ABDOMINAL (DRAPES) ×6 IMPLANT
DRAPE U-SHAPE 47X51 STRL (DRAPES) IMPLANT
DRAPE WARM FLUID 44X44 (DRAPE) ×6 IMPLANT
DRSG ADAPTIC 3X8 NADH LF (GAUZE/BANDAGES/DRESSINGS) IMPLANT
DRSG OPSITE POSTOP 4X10 (GAUZE/BANDAGES/DRESSINGS) IMPLANT
DRSG OPSITE POSTOP 4X8 (GAUZE/BANDAGES/DRESSINGS) IMPLANT
ELECT BLADE 4.0 EZ CLEAN MEGAD (MISCELLANEOUS) ×6
ELECT BLADE 6.5 EXT (BLADE) ×6 IMPLANT
ELECT CAUTERY BLADE 6.4 (BLADE) ×6 IMPLANT
ELECT REM PT RETURN 9FT ADLT (ELECTROSURGICAL) ×12
ELECTRODE BLDE 4.0 EZ CLN MEGD (MISCELLANEOUS) ×4 IMPLANT
ELECTRODE REM PT RTRN 9FT ADLT (ELECTROSURGICAL) ×8 IMPLANT
EVACUATOR 1/8 PVC DRAIN (DRAIN) IMPLANT
GAUZE SPONGE 4X4 12PLY STRL (GAUZE/BANDAGES/DRESSINGS) IMPLANT
GLOVE BIO SURGEON STRL SZ7 (GLOVE) ×12 IMPLANT
GLOVE BIO SURGEON STRL SZ7.5 (GLOVE) ×18 IMPLANT
GLOVE BIO SURGEON STRL SZ8 (GLOVE) ×12 IMPLANT
GLOVE BIOGEL PI IND STRL 7.5 (GLOVE) ×12 IMPLANT
GLOVE BIOGEL PI IND STRL 8 (GLOVE) ×16 IMPLANT
GLOVE BIOGEL PI INDICATOR 7.5 (GLOVE) ×6
GLOVE BIOGEL PI INDICATOR 8 (GLOVE) ×8
GLOVE ECLIPSE 7.5 STRL STRAW (GLOVE) ×6 IMPLANT
GLOVE SURG SS PI 7.0 STRL IVOR (GLOVE) ×6 IMPLANT
GOWN STRL REUS W/ TWL LRG LVL3 (GOWN DISPOSABLE) ×28 IMPLANT
GOWN STRL REUS W/ TWL XL LVL3 (GOWN DISPOSABLE) IMPLANT
GOWN STRL REUS W/TWL LRG LVL3 (GOWN DISPOSABLE) ×14
GOWN STRL REUS W/TWL XL LVL3 (GOWN DISPOSABLE)
HANDPIECE INTERPULSE COAX TIP (DISPOSABLE)
KIT BASIN OR (CUSTOM PROCEDURE TRAY) ×12 IMPLANT
KIT ROOM TURNOVER OR (KITS) ×12 IMPLANT
LIGASURE IMPACT 36 18CM CVD LR (INSTRUMENTS) IMPLANT
MANIFOLD NEPTUNE II (INSTRUMENTS) IMPLANT
NEEDLE 22X1 1/2 (OR ONLY) (NEEDLE) IMPLANT
NS IRRIG 1000ML POUR BTL (IV SOLUTION) ×12 IMPLANT
PACK GENERAL/GYN (CUSTOM PROCEDURE TRAY) ×6 IMPLANT
PACK ORTHO EXTREMITY (CUSTOM PROCEDURE TRAY) ×6 IMPLANT
PACK UNIVERSAL I (CUSTOM PROCEDURE TRAY) ×6 IMPLANT
PAD ARMBOARD 7.5X6 YLW CONV (MISCELLANEOUS) ×12 IMPLANT
PADDING CAST COTTON 6X4 STRL (CAST SUPPLIES) IMPLANT
PENCIL BUTTON HOLSTER BLD 10FT (ELECTRODE) ×6 IMPLANT
PIN HALF 5X200X85MM EXFIX (EXFIX) ×12 IMPLANT
RELOAD PROXIMATE 75MM BLUE (ENDOMECHANICALS) ×12 IMPLANT
SEALANT PATCH FIBRIN 2X4IN (MISCELLANEOUS) ×12 IMPLANT
SEPRAFILM PROCEDURAL PACK 3X5 (MISCELLANEOUS) IMPLANT
SET HNDPC FAN SPRY TIP SCT (DISPOSABLE) IMPLANT
SPECIMEN JAR LARGE (MISCELLANEOUS) IMPLANT
SPONGE ABDOMINAL VAC ABTHERA (MISCELLANEOUS) ×6 IMPLANT
SPONGE LAP 18X18 X RAY DECT (DISPOSABLE) ×24 IMPLANT
SPONGE SCRUB IODOPHOR (GAUZE/BANDAGES/DRESSINGS) IMPLANT
STAPLER GUN LINEAR PROX 60 (STAPLE) ×6 IMPLANT
STAPLER PROXIMATE 75MM BLUE (STAPLE) ×6 IMPLANT
STAPLER VISISTAT 35W (STAPLE) ×12 IMPLANT
STOCKINETTE IMPERVIOUS LG (DRAPES) IMPLANT
STRIP CLOSURE SKIN 1/2X4 (GAUZE/BANDAGES/DRESSINGS) IMPLANT
SUCTION FRAZIER HANDLE 10FR (MISCELLANEOUS)
SUCTION POOLE TIP (SUCTIONS) ×6 IMPLANT
SUCTION TUBE FRAZIER 10FR DISP (MISCELLANEOUS) IMPLANT
SUT ETHILON 3 0 PS 1 (SUTURE) ×6 IMPLANT
SUT NOVA 1 T20/GS 25DT (SUTURE) IMPLANT
SUT PDS AB 1 TP1 96 (SUTURE) IMPLANT
SUT PROLENE 2 0 CT2 30 (SUTURE) ×12 IMPLANT
SUT SILK 2 0 SH CR/8 (SUTURE) ×6 IMPLANT
SUT SILK 2 0 TIES 10X30 (SUTURE) ×6 IMPLANT
SUT SILK 3 0 SH CR/8 (SUTURE) ×6 IMPLANT
SUT SILK 3 0 TIES 10X30 (SUTURE) ×6 IMPLANT
SUT VIC AB 0 CT1 27 (SUTURE)
SUT VIC AB 0 CT1 27XBRD ANBCTR (SUTURE) IMPLANT
SUT VIC AB 2-0 CT1 27 (SUTURE)
SUT VIC AB 2-0 CT1 TAPERPNT 27 (SUTURE) IMPLANT
SYR CONTROL 10ML LL (SYRINGE) IMPLANT
TOWEL OR 17X24 6PK STRL BLUE (TOWEL DISPOSABLE) IMPLANT
TOWEL OR 17X26 10 PK STRL BLUE (TOWEL DISPOSABLE) ×12 IMPLANT
TRAY FOLEY CATH 16FRSI W/METER (SET/KITS/TRAYS/PACK) IMPLANT
TRAY FOLEY W/METER SILVER 16FR (SET/KITS/TRAYS/PACK) ×6 IMPLANT
TUBE CONNECTING 12'X1/4 (SUCTIONS) ×2
TUBE CONNECTING 12X1/4 (SUCTIONS) ×10 IMPLANT
UNDERPAD 30X30 INCONTINENT (UNDERPADS AND DIAPERS) IMPLANT
WATER STERILE IRR 1000ML POUR (IV SOLUTION) IMPLANT
YANKAUER SUCT BULB TIP NO VENT (SUCTIONS) ×18 IMPLANT

## 2016-02-09 NOTE — ED Notes (Signed)
3rd unit of PRBC up (Z6109([id number])

## 2016-02-09 NOTE — Anesthesia Postprocedure Evaluation (Signed)
Anesthesia Post Note  Patient: [name] ParadiseKatina Neumeister  Procedure(s) Performed: Procedure(s) (LRB): EXPLORATORY LAPAROTOMY (N/A) CHOLECYSTECTOMY SMALL BOWEL RESECTION, MESENTERIC REPAIR APPLICATION OF WOUND VAC REPAIR LIVER LACERATION EXTERNAL FIXATION PELVIS  Patient location during evaluation: SICU Anesthesia Type: General Level of consciousness: sedated Pain management: pain level controlled Vital Signs Assessment: post-procedure vital signs reviewed and stable Respiratory status: patient remains intubated per anesthesia plan Cardiovascular status: stable Anesthetic complications: no    Last Vitals:  Filed Vitals:   02/09/16 0900 02/09/16 1428  BP: 81/36 103/83  Pulse: 107 101  Resp: 14 16    Last Pain: There were no vitals filed for this visit.               Kennieth RadFitzgerald, Tanetta Fuhriman E

## 2016-02-09 NOTE — OR Nursing (Signed)
Call received from OR 1 for bed request for 34M postoperatively.

## 2016-02-09 NOTE — ED Provider Notes (Signed)
CSN: [id number]     Arrival date & time 02/09/16  5284 History   First MD Initiated Contact with Patient 02/09/16 [phone]     Chief Complaint  Patient presents with  . Trauma  . level 1      HPI  35 year old previously healthy female presenting after jumping off the fifth floor balcony of a hotel in a suicide attempt. She also drank approximately a third of a bottle of Drano prior to jumping. She wrote 'Do Not Resuscitate' on her shirt and on a sheet of paper in her hotel room prior to the attempt. She endorses feeling depressed but I'm unable to obtain any further history from her regarding this secondary to the acuity of her condition. She reports feeling short of breath as well as pain in her pelvis and right chest. Denies prior injuries or medical conditions.   No past medical history on file. No past surgical history on file. No family history on file. Social History  Substance Use Topics  . Smoking status: Not on file  . Smokeless tobacco: Not on file  . Alcohol Use: Not on file   OB History    No data available     Review of Systems  Unable to perform ROS: Acuity of condition      Allergies  Review of patient's allergies indicates not on file.  Home Medications   Prior to Admission medications   Not on File   BP 81/36 mmHg  Pulse 107  Resp 14  SpO2 100% Physical Exam  Constitutional: She is oriented to person, place, and time. She appears well-developed and well-nourished.  HENT:  Head: Normocephalic and atraumatic.  Right Ear: External ear normal.  Left Ear: External ear normal.  Nose: Nose normal.  Mouth/Throat: Oropharynx is clear and moist.  Eyes: Conjunctivae and EOM are normal. Pupils are equal, round, and reactive to light.  Neck: No tracheal deviation present.  C-collar in place  Cardiovascular: Regular rhythm, normal heart sounds and intact distal pulses.  Exam reveals no gallop and no friction rub.   No murmur heard. tachcyardic  Pulmonary/Chest:  She has no wheezes. She has no rales. She exhibits tenderness (R chest).  tachypneic to 20 breaths per minute, diminished on the right  Abdominal: Soft. Bowel sounds are normal. She exhibits no distension. There is no tenderness.  Musculoskeletal:  Extremities grossly atraumatic, moves all 4 extremities. No step-offs or deformities to the T or L spine. Pelvis stable, but TTP over the R hip  Neurological: She is alert and oriented to person, place, and time. She exhibits normal muscle tone.  Decreased rectal tone, moves all 4 extremities  Skin: Skin is warm and dry.    ED Course  Procedures (including critical care time) Labs Review Labs Reviewed  COMPREHENSIVE METABOLIC PANEL - Abnormal; Notable for the following:    CO2 10 (*)    Glucose, Bld 324 (*)    BUN <5 (*)    Creatinine, Ser 1.15 (*)    Calcium 8.3 (*)    Total Protein 5.9 (*)    Albumin 3.2 (*)    AST 595 (*)    ALT 456 (*)    Anion gap 22 (*)    All other components within normal limits  CBC - Abnormal; Notable for the following:    WBC 11.0 (*)    RBC 3.71 (*)    Hemoglobin 8.0 (*)    HCT 27.6 (*)    MCV 74.4 (*)  MCH 21.6 (*)    MCHC 29.0 (*)    All other components within normal limits  ETHANOL - Abnormal; Notable for the following:    Alcohol, Ethyl (B) 6 (*)    All other components within normal limits  PROTIME-INR - Abnormal; Notable for the following:    Prothrombin Time 19.1 (*)    INR 1.60 (*)    All other components within normal limits  I-STAT CHEM 8, ED - Abnormal; Notable for the following:    BUN 4 (*)    Creatinine, Ser 1.20 (*)    Glucose, Bld 310 (*)    Calcium, Ion 1.03 (*)    Hemoglobin 9.5 (*)    HCT 28.0 (*)    All other components within normal limits  POCT I-STAT 7, (LYTES, BLD GAS, ICA,H+H) - Abnormal; Notable for the following:    pH, Arterial 6.863 (*)    pO2, Arterial 131.0 (*)    Bicarbonate 8.3 (*)    Acid-base deficit 23.0 (*)    Calcium, Ion 0.71 (*)    HCT 17.0 (*)     Hemoglobin 5.8 (*)    All other components within normal limits  POCT I-STAT 4, (NA,K, GLUC, HGB,HCT) - Abnormal; Notable for the following:    Sodium 146 (*)    Potassium 5.2 (*)    Glucose, Bld 368 (*)    HCT 21.0 (*)    Hemoglobin 7.1 (*)    All other components within normal limits  CDS SEROLOGY  DIC (DISSEMINATED INTRAVASCULAR COAGULATION) PANEL  HEMOGLOBIN AND HEMATOCRIT, BLOOD  TYPE AND SCREEN  PREPARE FRESH FROZEN PLASMA  PREPARE PLATELET PHERESIS  ABO/RH  MASSIVE TRANSFUSION PROTOCOL ORDER (BLOOD BANK NOTIFICATION)    Imaging Review Dg Pelvis Portable  02/09/2016  CLINICAL DATA:  Larey SeatFell from several feet.  Right pelvis injury. EXAM: PORTABLE PELVIS 1-2 VIEWS COMPARISON:  None. FINDINGS: Fractures are noted through the right superior and inferior pubic rami. Possible fracture through the left inferior pubic ramus. There is widening of the pubic symphysis. SI joints appear symmetric and unremarkable. IMPRESSION: Right superior and inferior pubic rami fractures. Possible fracture through the left inferior pubic ramus with diastases of the pubic symphysis. Electronically Signed   By: Charlett NoseKevin  Dover M.D.   On: 02/09/2016 09:30   Dg Chest Port 1 View  02/09/2016  ADDENDUM REPORT: 02/09/2016 09:34 ADDENDUM: Compared to the chest radiograph at 0823 hours, there are increased densities along the left upper mediastinal region. A mediastinal hematoma cannot be excluded. Electronically Signed   By: Richarda OverlieAdam  Henn M.D.   On: 02/09/2016 09:34  02/09/2016  CLINICAL DATA:  Trauma and right chest injury. Evaluate chest tube insertion. EXAM: PORTABLE CHEST 1 VIEW COMPARISON:  02/09/2016 FINDINGS: Multiple displaced right rib fractures. Insertion of a large bore right chest tube. Side hole appears to be within the right chest cavity. There is increased subcutaneous gas in the right chest. No evidence for a large right pneumothorax. Prominent lung markings throughout the right lung with more confluent  densities in the right apex region. Findings are probably associated with contusions. Endotracheal tube is near the carina pointing into the right mainstem bronchus. Nasogastric tube extends into the abdomen. Patient is rotated towards the left. Heart and mediastinum are grossly normal. Left lung is clear except for linear densities at the left lung base which could represent atelectasis. IMPRESSION: Endotracheal tube near the carina, pointing into the right mainstem bronchus. Placement of right chest tube with increased subcutaneous gas. No large  pneumothorax. Patchy densities throughout the right lung, particularly at the apex. Findings most likely represent contusions. The report was called to the Operating Room on 02/09/2016 at 9:24 a.m. Electronically Signed: By: Richarda Overlie M.D. On: 02/09/2016 09:25   Dg Chest Portable 1 View  02/09/2016  CLINICAL DATA:  Larey Seat.  Right chest injury. EXAM: PORTABLE CHEST 1 VIEW COMPARISON:  None. FINDINGS: There are multiple displaced right rib fractures. There is subcutaneous gas in the right chest. No evidence for a large pneumothorax. Patchy densities throughout the right lung, particularly in the right upper lobe lobe. Patient is rotated towards the left. There are densities along the upper mediastinum and cannot exclude a mediastinal hematoma. Heart size is grossly normal. The entire left lung base is not visualized. IMPRESSION: Multiple displaced right rib fractures with subcutaneous gas. Patchy densities throughout the right chest most likely represent contusions. No evidence for a large pneumothorax. Limited evaluation the mediastinum due to patient rotation. Increased densities along the upper mediastinum and concern for mediastinal fluid or hematoma. This report was called to the operating room on 02/09/2016 at 9:30 a.m. Electronically Signed   By: Richarda Overlie M.D.   On: 02/09/2016 09:32   I have personally reviewed and evaluated these images and lab results as part  of my medical decision-making.   EKG Interpretation None      MDM   Final diagnoses:  Fall, initial encounter  Multiple closed fractures of pelvis with stable disruption of pelvic circle, initial encounter (HCC)  Rib fractures, right, closed, initial encounter  Hypotension, unspecified hypotension type   Patient arrives as a level I trauma activation. Trauma surgery present at bedside on arrival. Patient is tachycardic and hypotensive. Mass transfusion protocol initiated. Breath sounds diminished on the right. Chest x-ray with multiple displaced right rib fractures and subcutaneous gas. Patient intubated for tachypnea and airway protection, and right chest tube placed by trauma surgery team at bedside. Pelvis x-ray with right superior and inferior pubic rami fractures. FAST exam positive. Please see separately dictated note for details. Patient brought urgently to the operating room by trauma surgery.     Amit Meloy Ernestina Penna, MD 02/09/16 1025  Blane Ohara, MD 02/09/16 [phone]

## 2016-02-09 NOTE — ED Notes (Signed)
Belongings/clothing given to Detective L.D. Judithann GravesFarrar -- in paper bag-- please contact at [phone] to update. Is attempting to find family.

## 2016-02-09 NOTE — Progress Notes (Signed)
RT called by RN reference to patient desat.  Upon arrival to the room, FiO2 had been increased to 100%.  RN states patient dropped to 60% range and BVM ventilations were initiated.  Patient was returned to the ventilator prior to RT arrival.  Peak pressures noted increased to 40+ range.  MD notified and stat CXR ordered.  After discussion with Dr. Derrell Lollingamirez, discussion was made to decrease Vt to 7 ml/kg (with increase in RR) and re-evaluate.  RT to obtain ABG after 30 minutes.  RT will continue to monitor.

## 2016-02-09 NOTE — ED Notes (Signed)
Pt intubated per dr Jodi Mourningzavitz-- 7.5 ETT taped at 25 at lip. 18 fr OG placed per [place]had Grose, RN -- placement confirmed by port xray.

## 2016-02-09 NOTE — ED Notes (Signed)
2nd Unit PRBC [id number] started

## 2016-02-09 NOTE — Progress Notes (Signed)
CRITICAL VALUE ALERT  Critical value received:  Lactic acid 5.7  Date of notification:  02/09/16  Time of notification:  1555  Critical value read back: yes  Nurse who received alert:  Londell MohKaty Lonita Debes, RN  MD notified (1st page):  Charma IgoMichael Jeffery, PA  Time of first page:  1600  MD notified (2nd page):  Time of second page:  Responding MD:  Charma IgoMichael Jeffery, PA  Time MD responded:  [phone]

## 2016-02-09 NOTE — ED Notes (Signed)
1st unit FFP complete-- 270cc

## 2016-02-09 NOTE — ED Notes (Signed)
3rd unit in, 4th unit up (Z6109([id number])

## 2016-02-09 NOTE — ED Notes (Signed)
2nd unit complete

## 2016-02-09 NOTE — Progress Notes (Signed)
   02/09/16 0900  Clinical Encounter Type  Visited With Health care provider  Visit Type ED  Referral From Nurse  Spiritual Encounters  Spiritual Needs Other (Comment)  Stress Factors  Patient Stress Factors None identified  Patient was apparent suicide attempt. Doctors were working with her and sent her to the operating room with no chaplain contact. No family has been located yet.

## 2016-02-09 NOTE — Brief Op Note (Signed)
02/09/2016  2:12 PM  PATIENT:  [name] Bell  35 y.o. female  PRE-OPERATIVE DIAGNOSIS:  Pelvic ring disruption, hypotension  POST-OPERATIVE DIAGNOSIS:   Pelvic ring disruption, hypotension, liver laceration, peritoneal bleed, mesenteric tear, hematuria, suspected greater trochanteric fracture  PROCEDURE:  Procedure(s): Panel 1: EXPLORATORY LAPAROTOMY (N/A) CHOLECYSTECTOMY SMALL BOWEL RESECTION, MESENTERIC REPAIR APPLICATION OF WOUND VAC REPAIR LIVER LACERATION  Panel 2: CLOSED REDUCTION PELVIC RING EXTERNAL FIXATION PELVIS STRESS FLOURO OF THE RIGHT HIP UNDER ANESTHESIA  SURGEON:  Surgeon(s) and Role: Panel 1:    * Jimmye NormanJames [name], MD - Primary  Panel 2:    * Myrene GalasMichael Curly Mackowski, MD - Primary  PHYSICIAN ASSISTANT: Patrick Jupiterarla Bethune, RNFA  ANESTHESIA:   general, Marcene Duosobert Fitzgerald, MD  I/O: Minimal EBL from Orthopaedic Procedure Total receipts: 16u PRBC, 14uFFP, 1 pack PLTs with more en route; 1 cryo  SPECIMEN:  No Specimen  TOURNIQUET:  * No tourniquets in log *  DICTATION: .Other Dictation: Dictation Number [id number]

## 2016-02-09 NOTE — H&P (Signed)
[name] Bell is an 35 y.o. female.   Chief Complaint: Fall HPI: [name] SickleKatina jumped from her 5th floor hotel room. She was brought to the ED as a levbel 1 trauma secondary to hypotension. She c/o abd pain. She was conscious, denied medical history, admitted to drinking a liquid drain remover prior to jumping. She was intubated in the ED.   No past medical history on file.  No past surgical history on file.  No family history on file. Social History:  has no tobacco, alcohol, and drug history on file.  Allergies: Allergies not on file  Results for orders placed or performed during the hospital encounter of 02/09/16 (from the past 48 hour(s))  Type and screen     Status: None (Preliminary result)   Collection Time: 02/09/16  8:10 AM  Result Value Ref Range   ABO/RH(D) O POS    Antibody Screen PENDING    Sample Expiration 02/12/2016    Unit Number [id number]    Blood Component Type RED CELLS,LR    Unit division 00    Status of Unit ISSUED    Unit tag comment VERBAL ORDERS PER DR ZAVITZ    Transfusion Status OK TO TRANSFUSE    Crossmatch Result PENDING    Unit Number [id number]    Blood Component Type RBC LR PHER2    Unit division 00    Status of Unit ISSUED    Unit tag comment VERBAL ORDERS PER DR ZAVITZ    Transfusion Status OK TO TRANSFUSE    Crossmatch Result PENDING    Unit Number [id number]    Blood Component Type RED CELLS,LR    Unit division 00    Status of Unit ISSUED    Unit tag comment VERBAL ORDERS PER DR ZAVITZ    Transfusion Status OK TO TRANSFUSE    Crossmatch Result PENDING    Unit Number [id number]    Blood Component Type RED CELLS,LR    Unit division 00    Status of Unit ISSUED    Unit tag comment VERBAL ORDERS PER DR ZAVITX    Transfusion Status OK TO TRANSFUSE    Crossmatch Result PENDING    Unit Number [id number]    Blood Component Type RED CELLS,LR    Unit division 00    Status of Unit ISSUED    Unit tag comment VERBAL ORDERS PER DR  ZAVITZ    Transfusion Status OK TO TRANSFUSE    Crossmatch Result PENDING    Unit Number [id number]    Blood Component Type RED CELLS,LR    Unit division 00    Status of Unit ISSUED    Unit tag comment VERBAL ORDERS PER DR ZAVITZ    Transfusion Status OK TO TRANSFUSE    Crossmatch Result PENDING    Unit Number [id number]    Blood Component Type RBC LR PHER1    Unit division 00    Status of Unit ISSUED    Unit tag comment VERBAL ORDERS PER DR ZAVITZ    Transfusion Status OK TO TRANSFUSE    Crossmatch Result PENDING    Unit Number [id number]    Blood Component Type RED CELLS,LR    Unit division 00    Status of Unit ISSUED    Unit tag comment VERBAL ORDERS PER DR ZAVITZ    Transfusion Status OK TO TRANSFUSE    Crossmatch Result PENDING   Prepare fresh frozen plasma     Status: None (Preliminary result)   Collection Time: 02/09/16  8:10  AM  Result Value Ref Range   Unit Number [id number]    Blood Component Type LIQ PLASMA    Unit division 00    Status of Unit ISSUED    Unit tag comment VERBAL ORDERS PER DR ZAVITZ    Transfusion Status OK TO TRANSFUSE    Unit Number [id number]    Blood Component Type LIQ PLASMA    Unit division 00    Status of Unit ISSUED    Unit tag comment VERBAL ORDERS PER DR ZAVITZ    Transfusion Status OK TO TRANSFUSE    Unit Number [id number]    Blood Component Type THAWED PLASMA    Unit division 00    Status of Unit ISSUED    Unit tag comment VERBAL ORDERS PER DR ZAVITZ    Transfusion Status OK TO TRANSFUSE    Unit Number [id number]    Blood Component Type THAWED PLASMA    Unit division 00    Status of Unit ISSUED    Unit tag comment VERBAL ORDERS PER DR ZAVITZ    Transfusion Status OK TO TRANSFUSE   I-Stat Chem 8, ED  (not at [facility])     Status: Abnormal   Collection Time: 02/09/16  8:40 AM  Result Value Ref Range   Sodium 138 135 - 145 mmol/L   Potassium 5.0 3.5 - 5.1 mmol/L   Chloride 107 101 - 111 mmol/L   BUN 4  (L) 6 - 20 mg/dL   Creatinine, Ser 7.25 (H) 0.44 - 1.00 mg/dL   Glucose, Bld 366 (H) 65 - 99 mg/dL   Calcium, Ion 4.40 (L) 1.12 - 1.23 mmol/L   TCO2 12 0 - 100 mmol/L   Hemoglobin 9.5 (L) 12.0 - 15.0 g/dL   HCT 34.7 (L) 42.5 - 95.6 %   No results found.  Review of Systems  Unable to perform ROS: intubated    Blood pressure 80/60, pulse 87, resp. rate 20, SpO2 100 %. Physical Exam  Vitals reviewed. Constitutional: She is oriented to person, place, and time. She appears well-developed and well-nourished. She is cooperative. No distress. Cervical collar and nasal cannula in place.  HENT:  Head: Normocephalic and atraumatic. Head is without raccoon's eyes, without Battle's sign, without abrasion, without contusion and without laceration.  Right Ear: Hearing, tympanic membrane, external ear and ear canal normal. No lacerations. No drainage or tenderness. No foreign bodies. Tympanic membrane is not perforated. No hemotympanum.  Left Ear: Hearing, tympanic membrane, external ear and ear canal normal. No lacerations. No drainage or tenderness. No foreign bodies. Tympanic membrane is not perforated. No hemotympanum.  Nose: Nose normal. No nose lacerations, sinus tenderness, nasal deformity or nasal septal hematoma. No epistaxis.  Mouth/Throat: Uvula is midline, oropharynx is clear and moist and mucous membranes are normal. No lacerations. No oropharyngeal exudate.  Eyes: Conjunctivae, EOM and lids are normal. Pupils are equal, round, and reactive to light. Right eye exhibits no discharge. Left eye exhibits no discharge. No scleral icterus.  Neck: Trachea normal. No JVD present. No spinous process tenderness and no muscular tenderness present. Carotid bruit is not present. No tracheal deviation present. No thyromegaly present.  Cardiovascular: Normal rate, regular rhythm, normal heart sounds, intact distal pulses and normal pulses.  Exam reveals no gallop and no friction rub.   No murmur  heard. Respiratory: Effort normal and breath sounds normal. No stridor. No respiratory distress. She has no wheezes. She has no rales. She exhibits tenderness. She exhibits no bony tenderness, no  laceration and no crepitus.  GI: Soft. Normal appearance. She exhibits no distension. Bowel sounds are decreased. There is tenderness. There is no rigidity, no rebound, no guarding and no CVA tenderness.  Genitourinary: Vagina normal.  Musculoskeletal: Normal range of motion. She exhibits no edema.       Right ankle: She exhibits ecchymosis.       Left forearm: She exhibits laceration.       Right upper leg: She exhibits tenderness.  Lymphadenopathy:    She has no cervical adenopathy.  Neurological: She is alert and oriented to person, place, and time. She has normal strength. No cranial nerve deficit or sensory deficit. GCS eye subscore is 4. GCS verbal subscore is 5. GCS motor subscore is 6.  Skin: Skin is warm, dry and intact. She is not diaphoretic.  Psychiatric: She has a normal mood and affect. Her speech is normal and behavior is normal.     Assessment/Plan Fall Multiple right rib fxs w/HPTX s/p CT Hemoperitoneum -- to OR for ex lap Multiple pelvic fxs Hypovolemic shock Left forearm lac Right ankle abrasions/swelling Multiple contusions ARF  Critical care time:    Freeman Caldron, PA-C Pager: [phone] General Trauma PA Pager: [phone] 02/09/2016, 8:55 AM

## 2016-02-09 NOTE — Anesthesia Preprocedure Evaluation (Signed)
Anesthesia Evaluation  Patient identified by MRN, date of birth, ID band Patient awake and Patient unresponsive    Reviewed: Unable to perform ROS - Chart review onlyPreop documentation limited or incomplete due to emergent nature of procedure.  Airway Mallampati: Intubated   Neck ROM: Limited   Comment: C-collar in place Dental  (+) Dental Advisory Given   Pulmonary  unknown   breath sounds clear to auscultation   + intubated    Cardiovascular  Rhythm:Regular Rate:Tachycardia  unknown   Neuro/Psych unknownunknown    GI/Hepatic Unknown Alleged draino ingestion +FAST exam in ED   Endo/Other    Renal/GU unknown     Musculoskeletal Left wrist laceration   Abdominal   Peds  Hematology Receiving emergency release blood   Anesthesia Other Findings Day of surgery medications reviewed with the patient.  Reproductive/Obstetrics unknown                             Anesthesia Physical Anesthesia Plan  ASA: V and emergent  Anesthesia Plan: General   Post-op Pain Management:    Induction: Intravenous  Airway Management Planned: Oral ETT  Additional Equipment: Arterial line, CVP, TEE and Ultrasound Guidance Line Placement  Intra-op Plan:   Post-operative Plan: Post-operative intubation/ventilation  Informed Consent: I have reviewed the patients History and Physical, chart, labs and discussed the procedure including the risks, benefits and alternatives for the proposed anesthesia with the patient or authorized representative who has indicated his/her understanding and acceptance.   Dental advisory given  Plan Discussed with: CRNA  Anesthesia Plan Comments: (Arrived as Level 1 trauma straight to OR. Patient intubated prior to arrival.  Ingested liquid draino and jumped off 5 story building.)        Anesthesia Quick Evaluation

## 2016-02-09 NOTE — ED Notes (Signed)
2nd unit FFP up.

## 2016-02-09 NOTE — Progress Notes (Signed)
Patient was manually ventilated during transport to the OR without any apparent complications.

## 2016-02-09 NOTE — Progress Notes (Signed)
Ventilator alarming with expiratory cassette failure upon entering room.  Ventilator still working appropriately with exception of mild volume loss (3-5%).  Ventilator switched out without complication.  RT will continue to monitor.

## 2016-02-09 NOTE — ED Notes (Addendum)
[facility] jumped [facility] 5[facility] floor balcony-- admitted to jumping, had DNR written on shirt and a signed piece of cardboard with DNR and her signature.  See trauma chart

## 2016-02-09 NOTE — Consult Note (Signed)
Orthopaedic Trauma Service Consultation  Reason for Consult: Pelvic ring disruption, multiple fractures Referring Physician: Hulen Skains, MD  [name] Bell is an 35 y.o. female.  HPI: Suicidal jumper from 5 stories up.  Drank Liquid Drano prior to leap.  Answering questions on arrival to trauma bay; hypotensive with positive fast scan. I was asked to consult emergently as patient was taken to the OR for emergent exploratory laparotomy.  From [place], [place], without family immediately available.  On arrival patient was in the OR and laparotomy under way.    No past medical history on file.  No past surgical history on file.  No family history on file.  Social History:  has no tobacco, alcohol, and drug history on file.  Allergies: Not on File  Medications: Medications unknown at this time.  Results for orders placed or performed during the hospital encounter of 02/09/16 (from the past 48 hour(s))  Type and screen     Status: None (Preliminary result)   Collection Time: 02/09/16  8:10 AM  Result Value Ref Range   ABO/RH(D) O POS    Antibody Screen NEG    Sample Expiration 02/12/2016    Unit Number [id number]    Blood Component Type RED CELLS,LR    Unit division 00    Status of Unit ISSUED    Unit tag comment VERBAL ORDERS PER DR ZAVITZ    Transfusion Status OK TO TRANSFUSE    Crossmatch Result COMPATIBLE    Unit Number [id number]    Blood Component Type RBC LR PHER2    Unit division 00    Status of Unit ISSUED    Unit tag comment VERBAL ORDERS PER DR ZAVITZ    Transfusion Status OK TO TRANSFUSE    Crossmatch Result COMPATIBLE    Unit Number [id number]    Blood Component Type RED CELLS,LR    Unit division 00    Status of Unit ISSUED    Unit tag comment VERBAL ORDERS PER DR ZAVITZ    Transfusion Status OK TO TRANSFUSE    Crossmatch Result COMPATIBLE    Unit Number [id number]    Blood Component Type RED CELLS,LR    Unit division 00    Status of Unit ISSUED    Unit  tag comment VERBAL ORDERS PER DR ZAVITX    Transfusion Status OK TO TRANSFUSE    Crossmatch Result COMPATIBLE    Unit Number [id number]    Blood Component Type RED CELLS,LR    Unit division 00    Status of Unit ISSUED    Unit tag comment VERBAL ORDERS PER DR ZAVITZ    Transfusion Status OK TO TRANSFUSE    Crossmatch Result COMPATIBLE    Unit Number [id number]    Blood Component Type RED CELLS,LR    Unit division 00    Status of Unit ISSUED    Unit tag comment VERBAL ORDERS PER DR ZAVITZ    Transfusion Status OK TO TRANSFUSE    Crossmatch Result COMPATIBLE    Unit Number [id number]    Blood Component Type RBC LR PHER1    Unit division 00    Status of Unit ISSUED    Unit tag comment VERBAL ORDERS PER DR ZAVITZ    Transfusion Status OK TO TRANSFUSE    Crossmatch Result COMPATIBLE    Unit Number [id number]    Blood Component Type RED CELLS,LR    Unit division 00    Status of Unit ISSUED    Unit tag comment VERBAL ORDERS PER  DR Reather Converse    Transfusion Status OK TO TRANSFUSE    Crossmatch Result COMPATIBLE    Unit Number [id number]    Blood Component Type RED CELLS,LR    Unit division 00    Status of Unit ISSUED    Unit tag comment VERBAL ORDERS PER DR ZAVITZ    Transfusion Status OK TO TRANSFUSE    Crossmatch Result NOT NEEDED    Unit Number [id number]    Blood Component Type RBC LR PHER2    Unit division 00    Status of Unit ISSUED    Unit tag comment VERBAL ORDERS PER DR ZAVITZ    Transfusion Status OK TO TRANSFUSE    Crossmatch Result NOT NEEDED    Unit Number [id number]    Blood Component Type RED CELLS,LR    Unit division 00    Status of Unit ISSUED    Unit tag comment VERBAL ORDERS PER DR ZAVITZ    Transfusion Status OK TO TRANSFUSE    Crossmatch Result NOT NEEDED    Unit Number [id number]    Blood Component Type RED CELLS,LR    Unit division 00    Status of Unit ISSUED    Unit tag comment VERBAL ORDERS PER DR ZAVITZ    Transfusion  Status OK TO TRANSFUSE    Crossmatch Result NOT NEEDED    Unit Number [id number]    Blood Component Type RED CELLS,LR    Unit division 00    Status of Unit ISSUED    Transfusion Status OK TO TRANSFUSE    Crossmatch Result Compatible    Unit Number [id number]    Blood Component Type RED CELLS,LR    Unit division 00    Status of Unit ISSUED    Transfusion Status OK TO TRANSFUSE    Crossmatch Result Compatible    Unit Number [id number]    Blood Component Type RED CELLS,LR    Unit division 00    Status of Unit ISSUED    Transfusion Status OK TO TRANSFUSE    Crossmatch Result Compatible    Unit Number [id number]    Blood Component Type RED CELLS,LR    Unit division 00    Status of Unit ISSUED    Transfusion Status OK TO TRANSFUSE    Crossmatch Result Compatible    Unit Number [id number]    Blood Component Type RBC LR PHER2    Unit division 00    Status of Unit ISSUED    Transfusion Status OK TO TRANSFUSE    Crossmatch Result Compatible    Unit Number [id number]    Blood Component Type RBC LR PHER1    Unit division 00    Status of Unit ISSUED    Transfusion Status OK TO TRANSFUSE    Crossmatch Result Compatible    Unit Number [id number]    Blood Component Type RBC LR PHER2    Unit division 00    Status of Unit ISSUED    Transfusion Status OK TO TRANSFUSE    Crossmatch Result Compatible    Unit Number [id number]    Blood Component Type RED CELLS,LR    Unit division 00    Status of Unit ISSUED    Transfusion Status OK TO TRANSFUSE    Crossmatch Result Compatible    Unit Number [id number]    Blood Component Type RED CELLS,LR    Unit division 00    Status of Unit ISSUED    Transfusion Status OK TO TRANSFUSE    Crossmatch  Result Compatible    Unit Number [id number]    Blood Component Type RBC LR PHER2    Unit division 00    Status of Unit ISSUED    Transfusion Status OK TO TRANSFUSE    Crossmatch Result Compatible    Unit Number  [id number]    Blood Component Type RBC LR PHER1    Unit division 00    Status of Unit ISSUED    Transfusion Status OK TO TRANSFUSE    Crossmatch Result Compatible    Unit Number [id number]    Blood Component Type RBC LR PHER1    Unit division 00    Status of Unit ISSUED    Transfusion Status OK TO TRANSFUSE    Crossmatch Result Compatible    Unit Number [id number]    Blood Component Type RED CELLS,LR    Unit division 00    Status of Unit ISSUED    Transfusion Status OK TO TRANSFUSE    Crossmatch Result Compatible    Unit Number [id number]    Blood Component Type RBC LR PHER2    Unit division 00    Status of Unit ISSUED    Transfusion Status OK TO TRANSFUSE    Crossmatch Result Compatible    Unit Number [id number]    Blood Component Type RBC LR PHER1    Unit division 00    Status of Unit ISSUED    Transfusion Status OK TO TRANSFUSE    Crossmatch Result Compatible    Unit Number [id number]    Blood Component Type RBC LR PHER2    Unit division 00    Status of Unit ISSUED    Transfusion Status OK TO TRANSFUSE    Crossmatch Result Compatible    Unit Number [id number]    Blood Component Type RBC LR PHER1    Unit division 00    Status of Unit ISSUED    Transfusion Status OK TO TRANSFUSE    Crossmatch Result Compatible    Unit Number [id number]    Blood Component Type RED CELLS,LR    Unit division 00    Status of Unit ISSUED    Transfusion Status OK TO TRANSFUSE    Crossmatch Result Compatible    Unit Number [id number]    Blood Component Type RED CELLS,LR    Unit division 00    Status of Unit ISSUED    Transfusion Status OK TO TRANSFUSE    Crossmatch Result Compatible    Unit Number [id number]    Blood Component Type RBC LR PHER2    Unit division 00    Status of Unit ISSUED    Transfusion Status OK TO TRANSFUSE    Crossmatch Result Compatible    Unit Number [id number]    Blood Component Type RBC LR PHER2    Unit division 00     Status of Unit ISSUED    Transfusion Status OK TO TRANSFUSE    Crossmatch Result Compatible    Unit Number [id number]    Blood Component Type RBC LR PHER1    Unit division 00    Status of Unit ISSUED    Transfusion Status OK TO TRANSFUSE    Crossmatch Result Compatible    Unit Number [id number]    Blood Component Type RED CELLS,LR    Unit division 00    Status of Unit ALLOCATED    Transfusion Status OK TO TRANSFUSE    Crossmatch Result Compatible    Unit Number [id number]    Blood Component Type  RBC LR PHER2    Unit division 00    Status of Unit ALLOCATED    Transfusion Status OK TO TRANSFUSE    Crossmatch Result Compatible    Unit Number [id number]    Blood Component Type RED CELLS,LR    Unit division 00    Status of Unit ALLOCATED    Transfusion Status OK TO TRANSFUSE    Crossmatch Result Compatible    Unit Number [id number]    Blood Component Type RBC LR PHER2    Unit division 00    Status of Unit ALLOCATED    Transfusion Status OK TO TRANSFUSE    Crossmatch Result Compatible   Prepare fresh frozen plasma     Status: None (Preliminary result)   Collection Time: 02/09/16  8:10 AM  Result Value Ref Range   Unit Number [id number]    Blood Component Type LIQ PLASMA    Unit division 00    Status of Unit ISSUED    Unit tag comment VERBAL ORDERS PER DR ZAVITZ    Transfusion Status OK TO TRANSFUSE    Unit Number [id number]    Blood Component Type LIQ PLASMA    Unit division 00    Status of Unit ISSUED    Unit tag comment VERBAL ORDERS PER DR ZAVITZ    Transfusion Status OK TO TRANSFUSE    Unit Number [id number]    Blood Component Type THAWED PLASMA    Unit division 00    Status of Unit ISSUED    Unit tag comment VERBAL ORDERS PER DR ZAVITZ    Transfusion Status OK TO TRANSFUSE    Unit Number [id number]    Blood Component Type THAWED PLASMA    Unit division 00    Status of Unit ISSUED    Unit tag comment VERBAL ORDERS PER DR ZAVITZ     Transfusion Status OK TO TRANSFUSE    Unit Number [id number]    Blood Component Type THAWED PLASMA    Unit division 00    Status of Unit ISSUED    Transfusion Status OK TO TRANSFUSE    Unit Number [id number]    Blood Component Type THAWED PLASMA    Unit division 00    Status of Unit ISSUED    Transfusion Status OK TO TRANSFUSE    Unit Number [id number]    Blood Component Type THAWED PLASMA    Unit division 00    Status of Unit ISSUED    Transfusion Status OK TO TRANSFUSE    Unit Number [id number]    Blood Component Type THAWED PLASMA    Unit division 00    Status of Unit ISSUED    Transfusion Status OK TO TRANSFUSE    Unit Number [id number]    Blood Component Type THAWED PLASMA    Unit division 00    Status of Unit ISSUED    Transfusion Status OK TO TRANSFUSE    Unit Number [id number]    Blood Component Type THWPLS APHR1    Unit division 00    Status of Unit ISSUED    Transfusion Status OK TO TRANSFUSE    Unit Number [id number]    Blood Component Type THWPLS APHR1    Unit division 00    Status of Unit ISSUED    Transfusion Status OK TO TRANSFUSE    Unit Number [id number]    Blood Component Type THW PLS APHR    Unit division 00    Status of Unit ISSUED  Transfusion Status OK TO TRANSFUSE    Unit Number [id number]    Blood Component Type THAWED PLASMA    Unit division 00    Status of Unit ISSUED    Transfusion Status OK TO TRANSFUSE    Unit Number [id number]    Blood Component Type THAWED PLASMA    Unit division 00    Status of Unit ISSUED    Transfusion Status OK TO TRANSFUSE    Unit Number [id number]    Blood Component Type THAWED PLASMA    Unit division 00    Status of Unit ISSUED    Transfusion Status OK TO TRANSFUSE    Unit Number [id number]    Blood Component Type THAWED PLASMA    Unit division 00    Status of Unit ISSUED    Transfusion Status OK TO TRANSFUSE    Unit Number [id number]    Blood Component Type  THAWED PLASMA    Unit division 00    Status of Unit ISSUED    Transfusion Status OK TO TRANSFUSE    Unit Number [id number]    Blood Component Type THAWED PLASMA    Unit division 00    Status of Unit ISSUED    Transfusion Status OK TO TRANSFUSE    Unit Number [id number]    Blood Component Type THAWED PLASMA    Unit division 00    Status of Unit ISSUED    Transfusion Status OK TO TRANSFUSE    Unit Number [id number]    Blood Component Type THAWED PLASMA    Unit division 00    Status of Unit ISSUED    Transfusion Status OK TO TRANSFUSE    Unit Number [id number]    Blood Component Type THAWED PLASMA    Unit division 00    Status of Unit ISSUED    Transfusion Status OK TO TRANSFUSE    Unit Number [id number]    Blood Component Type THAWED PLASMA    Unit division 00    Status of Unit ISSUED    Transfusion Status OK TO TRANSFUSE    Unit Number [id number]    Blood Component Type THAWED PLASMA    Unit division 00    Status of Unit ISSUED    Transfusion Status OK TO TRANSFUSE    Unit Number [id number]    Blood Component Type THAWED PLASMA    Unit division 00    Status of Unit ISSUED    Transfusion Status OK TO TRANSFUSE    Unit Number [id number]    Blood Component Type THAWED PLASMA    Unit division 00    Status of Unit ISSUED    Transfusion Status OK TO TRANSFUSE    Unit Number [id number]    Blood Component Type THAWED PLASMA    Unit division 00    Status of Unit ISSUED    Transfusion Status OK TO TRANSFUSE    Unit Number [id number]    Blood Component Type THAWED PLASMA    Unit division 00    Status of Unit ISSUED    Transfusion Status OK TO TRANSFUSE    Unit Number [id number]    Blood Component Type THAWED PLASMA    Unit division 00    Status of Unit ISSUED    Transfusion Status OK TO TRANSFUSE    Unit Number [id number]    Blood Component Type THAWED PLASMA    Unit division 00    Status of Unit ALLOCATED    Transfusion Status OK  TO TRANSFUSE    Unit Number [id number]    Blood Component Type THAWED PLASMA    Unit division 00    Status of Unit ALLOCATED    Transfusion Status OK TO TRANSFUSE    Unit Number [id number]    Blood Component Type THAWED PLASMA    Unit division 00    Status of Unit ALLOCATED    Transfusion Status OK TO TRANSFUSE    Unit Number [id number]    Blood Component Type THAWED PLASMA    Unit division 00    Status of Unit ALLOCATED    Transfusion Status OK TO TRANSFUSE   CDS serology     Status: None   Collection Time: 02/09/16  8:29 AM  Result Value Ref Range   CDS serology specimen      SPECIMEN WILL BE HELD FOR 14 DAYS IF TESTING IS REQUIRED  Comprehensive metabolic panel     Status: Abnormal   Collection Time: 02/09/16  8:29 AM  Result Value Ref Range   Sodium 138 135 - 145 mmol/L   Potassium 5.0 3.5 - 5.1 mmol/L   Chloride 106 101 - 111 mmol/L   CO2 10 (L) 22 - 32 mmol/L   Glucose, Bld 324 (H) 65 - 99 mg/dL   BUN <5 (L) 6 - 20 mg/dL   Creatinine, Ser 1.15 (H) 0.44 - 1.00 mg/dL   Calcium 8.3 (L) 8.9 - 10.3 mg/dL   Total Protein 5.9 (L) 6.5 - 8.1 g/dL   Albumin 3.2 (L) 3.5 - 5.0 g/dL   AST 595 (H) 15 - 41 U/L   ALT 456 (H) 14 - 54 U/L   Alkaline Phosphatase 43 38 - 126 U/L   Total Bilirubin 0.7 0.3 - 1.2 mg/dL   GFR calc non Af Amer >60 >60 mL/min   GFR calc Af Amer >60 >60 mL/min    Comment: (NOTE) The eGFR has been calculated using the CKD EPI equation. This calculation has not been validated in all clinical situations. eGFR's persistently <60 mL/min signify possible Chronic Kidney Disease.    Anion gap 22 (H) 5 - 15  CBC     Status: Abnormal   Collection Time: 02/09/16  8:29 AM  Result Value Ref Range   WBC 11.0 (H) 4.0 - 10.5 K/uL   RBC 3.71 (L) 3.87 - 5.11 MIL/uL   Hemoglobin 8.0 (L) 12.0 - 15.0 g/dL    Comment: REPEATED TO VERIFY   HCT 27.6 (L) 36.0 - 46.0 %   MCV 74.4 (L) 78.0 - 100.0 fL   MCH 21.6 (L) 26.0 - 34.0 pg   MCHC 29.0 (L) 30.0 - 36.0 g/dL    RDW 15.5 11.5 - 15.5 %   Platelets 307 150 - 400 K/uL  Ethanol     Status: Abnormal   Collection Time: 02/09/16  8:29 AM  Result Value Ref Range   Alcohol, Ethyl (B) 6 (H) <5 mg/dL    Comment:        LOWEST DETECTABLE LIMIT FOR SERUM ALCOHOL IS 5 mg/dL FOR MEDICAL PURPOSES ONLY   Protime-INR     Status: Abnormal   Collection Time: 02/09/16  8:29 AM  Result Value Ref Range   Prothrombin Time 19.1 (H) 11.6 - 15.2 seconds   INR 1.60 (H) 0.00 - 1.49  ABO/Rh     Status: None   Collection Time: 02/09/16  8:29 AM  Result Value Ref Range   ABO/RH(D) O POS   I-Stat Chem 8, ED  (not at  [facility], [facility])     Status: Abnormal   Collection Time: 02/09/16  8:40 AM  Result Value Ref Range   Sodium 138 135 - 145 mmol/L   Potassium 5.0 3.5 - 5.1 mmol/L   Chloride 107 101 - 111 mmol/L   BUN 4 (L) 6 - 20 mg/dL   Creatinine, Ser 1.20 (H) 0.44 - 1.00 mg/dL   Glucose, Bld 310 (H) 65 - 99 mg/dL   Calcium, Ion 1.03 (L) 1.12 - 1.23 mmol/L   TCO2 12 0 - 100 mmol/L   Hemoglobin 9.5 (L) 12.0 - 15.0 g/dL   HCT 28.0 (L) 36.0 - 46.0 %  Prepare platelet pheresis     Status: None (Preliminary result)   Collection Time: 02/09/16  9:00 AM  Result Value Ref Range   Unit Number [id number]    Blood Component Type PLTPH LI2 PAS    Unit division 00    Status of Unit ISSUED    Transfusion Status OK TO TRANSFUSE    Unit Number [id number]    Blood Component Type PLTP LR1 PAS    Unit division 00    Status of Unit ISSUED    Transfusion Status OK TO TRANSFUSE    Unit Number [id number]    Blood Component Type PLTP LR2 PAS    Unit division 00    Status of Unit ISSUED    Transfusion Status OK TO TRANSFUSE   I-STAT 7, (LYTES, BLD GAS, ICA, H+H)     Status: Abnormal   Collection Time: 02/09/16  9:50 AM  Result Value Ref Range   pH, Arterial 6.863 (LL) 7.350 - 7.450   pCO2 arterial 44.1 35.0 - 45.0 mmHg   pO2, Arterial 131.0 (H) 80.0 - 100.0 mmHg   Bicarbonate 8.3 (L) 20.0 - 24.0 mEq/L   TCO2 10 0 - 100  mmol/L   O2 Saturation 96.0 %   Acid-base deficit 23.0 (H) 0.0 - 2.0 mmol/L   Sodium 142 135 - 145 mmol/L   Potassium 4.3 3.5 - 5.1 mmol/L   Calcium, Ion 0.71 (L) 1.12 - 1.23 mmol/L   HCT 17.0 (L) 36.0 - 46.0 %   Hemoglobin 5.8 (LL) 12.0 - 15.0 g/dL   Patient temperature 34.8 C    Sample type ARTERIAL    Comment NOTIFIED PHYSICIAN   I-STAT 4, (NA,K, GLUC, HGB,HCT)     Status: Abnormal   Collection Time: 02/09/16 10:13 AM  Result Value Ref Range   Sodium 146 (H) 135 - 145 mmol/L   Potassium 5.2 (H) 3.5 - 5.1 mmol/L   Glucose, Bld 368 (H) 65 - 99 mg/dL   HCT 21.0 (L) 36.0 - 46.0 %   Hemoglobin 7.1 (L) 12.0 - 15.0 g/dL  Prepare cryoprecipitate     Status: None (Preliminary result)   Collection Time: 02/09/16 10:30 AM  Result Value Ref Range   Unit Number [id number]    Blood Component Type CRYPOOL THAW    Unit division 00    Status of Unit ISSUED    Transfusion Status OK TO TRANSFUSE    Unit Number [id number]    Blood Component Type CRYPOOL THAW    Unit division 00    Status of Unit ISSUED    Transfusion Status OK TO TRANSFUSE    Unit Number [id number]    Blood Component Type CRYPOOL THAW    Unit division 00    Status of Unit ISSUED    Transfusion Status OK TO TRANSFUSE    Unit Number [id number]  Blood Component Type CRYPOOL THAW    Unit division 00    Status of Unit ISSUED    Transfusion Status OK TO TRANSFUSE   DIC (disseminated intravasc coag) panel (STAT)     Status: Abnormal   Collection Time: 02/09/16 10:40 AM  Result Value Ref Range   Prothrombin Time 23.5 (H) 11.6 - 15.2 seconds   INR 2.11 (H) 0.00 - 1.49   aPTT 80 (H) 24 - 37 seconds    Comment:        IF BASELINE aPTT IS ELEVATED, SUGGEST PATIENT RISK ASSESSMENT BE USED TO DETERMINE APPROPRIATE ANTICOAGULANT THERAPY.    Fibrinogen 143 (L) 204 - 475 mg/dL   D-Dimer, Quant 13.37 (H) 0.00 - 0.50 ug/mL-FEU    Comment: (NOTE) At the manufacturer cut-off of 0.50 ug/mL FEU, this assay has  been documented to exclude PE with a sensitivity and negative predictive value of 97 to 99%.  At this time, this assay has not been approved by the FDA to exclude DVT/VTE. Results should be correlated with clinical presentation.    Platelets 87 (L) 150 - 400 K/uL    Comment: PLATELET COUNT CONFIRMED BY SMEAR REPEATED TO VERIFY    Smear Review NO SCHISTOCYTES SEEN   Hemoglobin and hematocrit, blood (STAT)     Status: Abnormal   Collection Time: 02/09/16 10:40 AM  Result Value Ref Range   Hemoglobin 7.8 (L) 12.0 - 15.0 g/dL    Comment: REPEATED TO VERIFY   HCT 24.1 (L) 36.0 - 46.0 %    Dg Pelvis Portable  02/09/2016  CLINICAL DATA:  [place] from several feet.  Right pelvis injury. EXAM: PORTABLE PELVIS 1-2 VIEWS COMPARISON:  None. FINDINGS: Fractures are noted through the right superior and inferior pubic rami. Possible fracture through the left inferior pubic ramus. There is widening of the pubic symphysis. SI joints appear symmetric and unremarkable. IMPRESSION: Right superior and inferior pubic rami fractures. Possible fracture through the left inferior pubic ramus with diastases of the pubic symphysis. Electronically Signed   By: Rolm Baptise M.D.   On: 02/09/2016 09:30   Dg Pelvis 3v [place]  02/09/2016  CLINICAL DATA:  Pelvic trauma, fell out of a window 5 floors up, external fixator placement EXAM: DG C-ARM 61-120 MIN; JUDET PELVIS - 3+ VIEW COMPARISON:  Pelvic radiograph 02/09/2016 FLUOROSCOPY TIME:  1 minutes 21 seconds Images obtained:  8 FINDINGS: Images demonstrate placement of external fixator anchors at BILATERAL iliac bones. Displaced fractures of the RIGHT superior and inferior pubic rami noted. Disruption of pubic symphysis. Hips appear normally located. LEFT superior and inferior pubic rami fractures are less well visualized than on the prior study. RIGHT femoral line noted. Sponges project over pelvis. IMPRESSION: Placement of external fixation anchors at the iliac bones bilaterally.  Pelvic fractures are better visualized on the preceding study. Electronically Signed   By: Lavonia Dana M.D.   On: 02/09/2016 13:24   Dg Chest Port 1 View  02/09/2016  ADDENDUM REPORT: 02/09/2016 09:34 ADDENDUM: Compared to the chest radiograph at 0823 hours, there are increased densities along the left upper mediastinal region. A mediastinal hematoma cannot be excluded. Electronically Signed   By: Markus Daft M.D.   On: 02/09/2016 09:34  02/09/2016  CLINICAL DATA:  Trauma and right chest injury. Evaluate chest tube insertion. EXAM: PORTABLE CHEST 1 VIEW COMPARISON:  02/09/2016 FINDINGS: Multiple displaced right rib fractures. Insertion of a large bore right chest tube. Side hole appears to be within the right chest cavity.  There is increased subcutaneous gas in the right chest. No evidence for a large right pneumothorax. Prominent lung markings throughout the right lung with more confluent densities in the right apex region. Findings are probably associated with contusions. Endotracheal tube is near the carina pointing into the right mainstem bronchus. Nasogastric tube extends into the abdomen. Patient is rotated towards the left. Heart and mediastinum are grossly normal. Left lung is clear except for linear densities at the left lung base which could represent atelectasis. IMPRESSION: Endotracheal tube near the carina, pointing into the right mainstem bronchus. Placement of right chest tube with increased subcutaneous gas. No large pneumothorax. Patchy densities throughout the right lung, particularly at the apex. Findings most likely represent contusions. The report was called to the Operating Room on 02/09/2016 at 9:24 a.m. Electronically Signed: By: Markus Daft M.D. On: 02/09/2016 09:25   Dg Chest Portable 1 View  02/09/2016  CLINICAL DATA:  [place].  Right chest injury. EXAM: PORTABLE CHEST 1 VIEW COMPARISON:  None. FINDINGS: There are multiple displaced right rib fractures. There is subcutaneous gas in the  right chest. No evidence for a large pneumothorax. Patchy densities throughout the right lung, particularly in the right upper lobe lobe. Patient is rotated towards the left. There are densities along the upper mediastinum and cannot exclude a mediastinal hematoma. Heart size is grossly normal. The entire left lung base is not visualized. IMPRESSION: Multiple displaced right rib fractures with subcutaneous gas. Patchy densities throughout the right chest most likely represent contusions. No evidence for a large pneumothorax. Limited evaluation the mediastinum due to patient rotation. Increased densities along the upper mediastinum and concern for mediastinal fluid or hematoma. This report was called to the operating room on 02/09/2016 at 9:30 a.m. Electronically Signed   By: Markus Daft M.D.   On: 02/09/2016 09:32   Dg C-arm 1-60 Min  02/09/2016  CLINICAL DATA:  Pelvic trauma, fell out of a window 5 floors up, external fixator placement EXAM: DG C-ARM 61-120 MIN; JUDET PELVIS - 3+ VIEW COMPARISON:  Pelvic radiograph 02/09/2016 FLUOROSCOPY TIME:  1 minutes 21 seconds Images obtained:  8 FINDINGS: Images demonstrate placement of external fixator anchors at BILATERAL iliac bones. Displaced fractures of the RIGHT superior and inferior pubic rami noted. Disruption of pubic symphysis. Hips appear normally located. LEFT superior and inferior pubic rami fractures are less well visualized than on the prior study. RIGHT femoral line noted. Sponges project over pelvis. IMPRESSION: Placement of external fixation anchors at the iliac bones bilaterally. Pelvic fractures are better visualized on the preceding study. Electronically Signed   By: Lavonia Dana M.D.   On: 02/09/2016 13:24    ROS unable to obtain Blood pressure 81/36, pulse 107, resp. rate 14, SpO2 100 %. Physical Exam Intubated under anaesthesia In C collar Pelvis--no traumatic wounds but stress mark left side of waist line; mild ecchymosis, unstable to manual  stress LUEX- on arm board; no gross instability but laceration of left volar wrist; brisk CR RUEX- on arm board; no gross instability; no traumatic wounds LLE-- no gross instability or open traumatic wounds; warm with brisk CR RLE-- ecchymosis about the right hip and flank; ankle with swelling and ecchymosis  Assessment/Plan: Hemodynamically unstable with unstable pelvic ring fracture 1. Immediate ex-fix with closed reduction 2. Xray left wrist right ankle, right hip 3. Stress flouro of right hip in OR 4. Foley placement  High risk of mortality and morbidity.  Proceeding emergently.   Altamese Muse, MD [facility],  PC [phone] (p)   02/09/2016  2:17 PM

## 2016-02-09 NOTE — Transfer of Care (Signed)
Immediate Anesthesia Transfer of Care Note  Patient: [name] Bell  Procedure(s) Performed: Procedure(s): EXPLORATORY LAPAROTOMY (N/A) CHOLECYSTECTOMY SMALL BOWEL RESECTION, MESENTERIC REPAIR APPLICATION OF WOUND VAC REPAIR LIVER LACERATION EXTERNAL FIXATION PELVIS  Patient Location: ICU  Anesthesia Type:General  Level of Consciousness: unresponsive  Airway & Oxygen Therapy: Patient remains intubated per anesthesia plan and Patient placed on Ventilator (see vital sign flow sheet for setting)  Post-op Assessment: Report given to RN and Post -op Vital signs reviewed and stable  Post vital signs: Reviewed and stable  Last Vitals:  Filed Vitals:   02/09/16 0900 02/09/16 1428  BP: 81/36 103/83  Pulse: 107 101  Resp: 14 16    Complications: No apparent anesthesia complications

## 2016-02-09 NOTE — ED Notes (Signed)
1st unit FFP up --

## 2016-02-09 NOTE — Op Note (Signed)
OPERATIVE REPORT  DATE OF OPERATION: 02/09/2016  PATIENT:  [name] ParadiseKatina Bell  35 y.o. female  PRE-OPERATIVE DIAGNOSIS:  Abd. Injury, hemoperitoneum hypotension and shock.  POST-OPERATIVE DIAGNOSIS:  Grade IV liver laceration, pelvic retroperitoneal bleed with intraperitoneal extension, mesenteric laceration with ischemic segment of small bowel, gallbladder fossa injury  PROCEDURE:  Procedure(s): EXPLORATORY LAPAROTOMY CHOLECYSTECTOMY SMALL BOWEL RESECTION, MESENTERIC REPAIR APPLICATION OF WOUND VAC REPAIR LIVER LACERATION PREPERITONEAL PELVIC PACKING   SURGEON:  Surgeon(s): Jimmye NormanJames Amilliana Hayworth, MD Myrene GalasMichael [name], MD  ASSISTANT: Janee Mornhompson, M.D.  ANESTHESIA:   general  EBL: 2,000 ml  BLOOD ADMINISTERED: 12 units CC PRBC, 12 units FFP and one unit PLTS  DRAINS: Nasogastric Tube, Urinary Catheter (Foley), 32Fr Chest Tube(s) in the right pleural space and NPWD   SPECIMEN:  Source of Specimen:  resected small bowel  COUNTS CORRECT:  YES  PROCEDURE DETAILS: The patient was taken to the operating room urgently from the emergency room trauma resuscitation room because of hemoperitoneum demonstrated on after FAST examination and shock.  A proper timeout was performed identifying the patient and this emergency procedure to be performed. A long midline incision was made from the xiphoid process down to almost the pubic crest. Was taken down through the midline fascia using electrocautery. Immediately there was a large amount of dark blood in the peritoneal cavity.  The patient had multiple right-sided rib fractures and no suspicion that the primary intraperitoneal injury was on the right side. With a Richardson retractor in place underneath the right costal margin a large liver laceration could be identified and was immediately walled off with lap tape packing in order to control the bleeding as we explored 5 evidence of bleeding.  We secondarily inspected the left upper quadrant where the spleen  appeared to be completely normal although there was hemoperitoneum underneath the left hemidiaphragm.  We subsequently inspected the pelvis where there was a large amount of dark blood accumulated in the left pelvic retroperitoneal hematoma which is performed through the peritoneum on the left side exceeding blood from the preperitoneal that side.Packings were left in that area and we subsequently packed the preperitoneal pelvic area using 4 lap tapes and the preperitoneal space between the peritoneum and the anterior abdominal wall.  The right lower quadrant appeared to be fine and then again in the right upper quadrant there appeared to be significant bleeding from this liver laceration.  With all areas packed and the patient getting actively resuscitated by the anesthesia personnel, it was noted that the gallbladder laceration went directly through the gallbladder fossa and appeared to partially amputated the gallbladder.A cholecystectomy was performed taking the gallbladder from the cystic duct and dissecting it away from the liver using electrocautery. We then placed a piece of Evarrest hemostatic gauze in the cleft of the laceration and secured in place with a large liver stitch of 0 chromic stitch on a blunt-tipped needle. This area was subsequently packed with lap tapes totaling the #3.  We ran the small bowel from the ligament of Treitz to the terminal ileum and found there to be 2 areas of mesenteric lacerations which were actively bleeding. The first long was repaired using figure-of-eight stitches of 2-0 silk the second one was a large amount segment rising ischemia to an 8 inch segment of small bowel which was resected with a primary anastomosis using a GIA stapler and subsequently a TX 60 stapler to close off the expected enterotomy. We inspected the left upper quadrant the pelvic area and right upper quadrant again  with packs in place. There was bleeding from the retroperitoneum on the left  side just superior and lateral to the preperitoneal packing which is likely extruding from the patient's. This is controlled with a patch of Evarrest also and a lap tape. Once this was done nose minimal irrigation and we closed the abdomen using a large intra-abdominal negative pressure wound dressing. All counts were correct including needles, and the expected number of sponges that were left in the peritoneal cavity totaling the #8. Instrument counts were correct.  PATIENT DISPOSITION:  The patient was taken directly to CT scan for surveillance scanning and then subsequently to interventional radiology for pelvic embolization. She was in critical condition. From there she went to the intensive care unit.    [name] Bell 4/3/20174:14 PM

## 2016-02-09 NOTE — ED Notes (Signed)
Family at beside. Family given emotional support. 

## 2016-02-09 NOTE — Progress Notes (Signed)
RT called by RN after patient desat to 83%.  Patient lavaged and suctioned with return of thick, bloody secretions.  No increase in SpO2 post procedure.  PEEP increased with minimal increase to 86-87%.  BVM ventilations initiated after subsequent desaturation.  No increase in resistance noted upon bagging.  Discussed with Dr. Derrell Lollingamirez via telephone who gave permission to increase PEEP as needed.  Rate also increased to 30 after discussion of ABG with physician.  RT will continue to monitor.

## 2016-02-10 ENCOUNTER — Encounter (HOSPITAL_COMMUNITY): Payer: Self-pay | Admitting: General Surgery

## 2016-02-10 ENCOUNTER — Encounter (HOSPITAL_COMMUNITY): Admission: EM | Disposition: A | Discharge status: Rehabilitation Facility | Payer: Self-pay | Source: Home / Self Care

## 2016-02-10 ENCOUNTER — Inpatient Hospital Stay (HOSPITAL_COMMUNITY): Payer: Medicaid Other

## 2016-02-10 ENCOUNTER — Encounter (HOSPITAL_COMMUNITY): Payer: Self-pay | Admitting: Anesthesiology

## 2016-02-10 ENCOUNTER — Inpatient Hospital Stay (HOSPITAL_COMMUNITY): Payer: Medicaid Other | Admitting: Certified Registered Nurse Anesthetist

## 2016-02-10 DIAGNOSIS — K259 Gastric ulcer, unspecified as acute or chronic, without hemorrhage or perforation: Secondary | ICD-10-CM

## 2016-02-10 DIAGNOSIS — T5494XA Toxic effect of unspecified corrosive substance, undetermined, initial encounter: Secondary | ICD-10-CM

## 2016-02-10 DIAGNOSIS — T5492XA Toxic effect of unspecified corrosive substance, intentional self-harm, initial encounter: Secondary | ICD-10-CM

## 2016-02-10 HISTORY — PX: LAPAROTOMY: SHX154

## 2016-02-10 HISTORY — PX: ESOPHAGOGASTRODUODENOSCOPY: SHX5428

## 2016-02-10 LAB — PREPARE CRYOPRECIPITATE
UNIT DIVISION: 0
UNIT DIVISION: 0
Unit division: 0
Unit division: 0
Unit division: 0

## 2016-02-10 LAB — BASIC METABOLIC PANEL
Anion gap: 21 — ABNORMAL HIGH (ref 5–15)
BUN: 12 mg/dL (ref 6–20)
CHLORIDE: 112 mmol/L — AB (ref 101–111)
CO2: 13 mmol/L — AB (ref 22–32)
CREATININE: 1.77 mg/dL — AB (ref 0.44–1.00)
Calcium: 5.9 mg/dL — CL (ref 8.9–10.3)
GFR calc Af Amer: 43 mL/min — ABNORMAL LOW (ref >60)
GFR calc non Af Amer: 37 mL/min — ABNORMAL LOW (ref >60)
GLUCOSE: 73 mg/dL (ref 65–99)
Potassium: 4 mmol/L (ref 3.5–5.1)
Sodium: 146 mmol/L — ABNORMAL HIGH (ref 135–145)

## 2016-02-10 LAB — CBC
HEMATOCRIT: 17.9 % — AB (ref 36.0–46.0)
HEMATOCRIT: 24 % — AB (ref 36.0–46.0)
HEMATOCRIT: 22.6 % — AB (ref 36.0–46.0)
HEMATOCRIT: 23.8 % — AB (ref 36.0–46.0)
HEMOGLOBIN: 8.2 g/dL — AB (ref 12.0–15.0)
HEMOGLOBIN: 7.9 g/dL — AB (ref 12.0–15.0)
HEMOGLOBIN: 8.2 g/dL — AB (ref 12.0–15.0)
Hemoglobin: 6.1 g/dL — CL (ref 12.0–15.0)
MCH: 28.6 pg (ref 26.0–34.0)
MCH: 29.3 pg (ref 26.0–34.0)
MCH: 29.6 pg (ref 26.0–34.0)
MCH: 29.6 pg (ref 26.0–34.0)
MCHC: 34.1 g/dL (ref 30.0–36.0)
MCHC: 34.2 g/dL (ref 30.0–36.0)
MCHC: 35 g/dL (ref 30.0–36.0)
MCHC: 34.5 g/dL (ref 30.0–36.0)
MCV: 84 fL (ref 78.0–100.0)
MCV: 85.7 fL (ref 78.0–100.0)
MCV: 84.6 fL (ref 78.0–100.0)
MCV: 85.9 fL (ref 78.0–100.0)
PLATELETS: 145 10*3/uL — AB (ref 150–400)
Platelets: 129 10*3/uL — ABNORMAL LOW (ref 150–400)
Platelets: 96 10*3/uL — ABNORMAL LOW (ref 150–400)
Platelets: 60 10*3/uL — ABNORMAL LOW (ref 150–400)
RBC: 2.13 MIL/uL — ABNORMAL LOW (ref 3.87–5.11)
RBC: 2.8 MIL/uL — AB (ref 3.87–5.11)
RBC: 2.67 MIL/uL — AB (ref 3.87–5.11)
RBC: 2.77 MIL/uL — ABNORMAL LOW (ref 3.87–5.11)
RDW: 14.6 % (ref 11.5–15.5)
RDW: 15.4 % (ref 11.5–15.5)
RDW: 15.7 % — ABNORMAL HIGH (ref 11.5–15.5)
RDW: 15.9 % — ABNORMAL HIGH (ref 11.5–15.5)
WBC: 4.2 10*3/uL (ref 4.0–10.5)
WBC: 6 10*3/uL (ref 4.0–10.5)
WBC: 14.1 10*3/uL — ABNORMAL HIGH (ref 4.0–10.5)
WBC: 13.3 10*3/uL — ABNORMAL HIGH (ref 4.0–10.5)

## 2016-02-10 LAB — POCT I-STAT 7, (LYTES, BLD GAS, ICA,H+H)
ACID-BASE DEFICIT: 16 mmol/L — AB (ref 0.0–2.0)
ACID-BASE DEFICIT: 15 mmol/L — AB (ref 0.0–2.0)
BICARBONATE: 15 meq/L — AB (ref 20.0–24.0)
Bicarbonate: 13.2 mEq/L — ABNORMAL LOW (ref 20.0–24.0)
CALCIUM ION: 0.86 mmol/L — AB (ref 1.12–1.23)
Calcium, Ion: 0.56 mmol/L — CL (ref 1.12–1.23)
HCT: 24 % — ABNORMAL LOW (ref 36.0–46.0)
HCT: 21 % — ABNORMAL LOW (ref 36.0–46.0)
HEMOGLOBIN: 7.1 g/dL — AB (ref 12.0–15.0)
Hemoglobin: 8.2 g/dL — ABNORMAL LOW (ref 12.0–15.0)
O2 Saturation: 94 %
O2 Saturation: 80 %
PH ART: 7.02 — AB (ref 7.350–7.450)
PH ART: 7.121 — AB (ref 7.350–7.450)
PO2 ART: 93 mmHg (ref 80.0–100.0)
Potassium: 6.3 mmol/L (ref 3.5–5.1)
Potassium: 4.2 mmol/L (ref 3.5–5.1)
SODIUM: 143 mmol/L (ref 135–145)
Sodium: 144 mmol/L (ref 135–145)
TCO2: 17 mmol/L (ref 0–100)
TCO2: 14 mmol/L (ref 0–100)
pCO2 arterial: 56 mmHg — ABNORMAL HIGH (ref 35.0–45.0)
pCO2 arterial: 40.4 mmHg (ref 35.0–45.0)
pO2, Arterial: 57 mmHg — ABNORMAL LOW (ref 80.0–100.0)

## 2016-02-10 LAB — BLOOD GAS, ARTERIAL
Acid-base deficit: 14.2 mmol/L — ABNORMAL HIGH (ref 0.0–2.0)
Acid-base deficit: 13.8 mmol/L — ABNORMAL HIGH (ref 0.0–2.0)
BICARBONATE: 12.6 meq/L — AB (ref 20.0–24.0)
BICARBONATE: 12.7 meq/L — AB (ref 20.0–24.0)
Drawn by: 280981
Drawn by: 449561
FIO2: 0.8
FIO2: 1
LHR: 30 {breaths}/min
LHR: 30 {breaths}/min
O2 SAT: 93.6 %
O2 SAT: 97.2 %
PCO2 ART: 34.5 mmHg — AB (ref 35.0–45.0)
PEEP: 12 cmH2O
PEEP: 12 cmH2O
Patient temperature: 98.6
Patient temperature: 98.6
TCO2: 13.7 mmol/L (ref 0–100)
TCO2: 13.8 mmol/L (ref 0–100)
VT: 490 mL
VT: 490 mL
pCO2 arterial: 35.8 mmHg (ref 35.0–45.0)
pH, Arterial: 7.172 — CL (ref 7.350–7.450)
pH, Arterial: 7.191 — CL (ref 7.350–7.450)
pO2, Arterial: 74.7 mmHg — ABNORMAL LOW (ref 80.0–100.0)
pO2, Arterial: 100 mmHg (ref 80.0–100.0)

## 2016-02-10 LAB — PREPARE PLATELET PHERESIS
UNIT DIVISION: 0
UNIT DIVISION: 0
UNIT DIVISION: 0
Unit division: 0
Unit division: 0
Unit division: 0

## 2016-02-10 LAB — COMPREHENSIVE METABOLIC PANEL
ALK PHOS: 43 U/L (ref 38–126)
ALT: 266 U/L — AB (ref 14–54)
ANION GAP: 13 (ref 5–15)
AST: 467 U/L — ABNORMAL HIGH (ref 15–41)
Albumin: 2.4 g/dL — ABNORMAL LOW (ref 3.5–5.0)
BILIRUBIN TOTAL: 1.2 mg/dL (ref 0.3–1.2)
BUN: 8 mg/dL (ref 6–20)
CALCIUM: 6 mg/dL — AB (ref 8.9–10.3)
CO2: 15 mmol/L — AB (ref 22–32)
CREATININE: 1.22 mg/dL — AB (ref 0.44–1.00)
Chloride: 113 mmol/L — ABNORMAL HIGH (ref 101–111)
GFR, EST NON AFRICAN AMERICAN: 58 mL/min — AB (ref >60)
Glucose, Bld: 86 mg/dL (ref 65–99)
Potassium: 4.2 mmol/L (ref 3.5–5.1)
Sodium: 141 mmol/L (ref 135–145)
TOTAL PROTEIN: 4.2 g/dL — AB (ref 6.5–8.1)

## 2016-02-10 LAB — POCT I-STAT 3, ART BLOOD GAS (G3+)
Acid-base deficit: 5 mmol/L — ABNORMAL HIGH (ref 0.0–2.0)
BICARBONATE: 21.6 meq/L (ref 20.0–24.0)
O2 SAT: 98 %
PCO2 ART: 48.3 mmHg — AB (ref 35.0–45.0)
Patient temperature: 37.8
TCO2: 23 mmol/L (ref 0–100)
pH, Arterial: 7.262 — ABNORMAL LOW (ref 7.350–7.450)
pO2, Arterial: 132 mmHg — ABNORMAL HIGH (ref 80.0–100.0)

## 2016-02-10 LAB — HEMOGLOBIN AND HEMATOCRIT, BLOOD
HEMATOCRIT: 24.1 % — AB (ref 36.0–46.0)
Hemoglobin: 8 g/dL — ABNORMAL LOW (ref 12.0–15.0)

## 2016-02-10 LAB — PROTIME-INR
INR: 1.55 — ABNORMAL HIGH (ref 0.00–1.49)
INR: 1.63 — AB (ref 0.00–1.49)
PROTHROMBIN TIME: 19.3 s — AB (ref 11.6–15.2)
Prothrombin Time: 18.6 seconds — ABNORMAL HIGH (ref 11.6–15.2)

## 2016-02-10 LAB — FIBRINOGEN: Fibrinogen: 247 mg/dL (ref 204–475)

## 2016-02-10 LAB — HEPATIC FUNCTION PANEL
ALT: 566 U/L — AB (ref 14–54)
AST: 775 U/L — AB (ref 15–41)
Albumin: 2.4 g/dL — ABNORMAL LOW (ref 3.5–5.0)
Alkaline Phosphatase: 41 U/L (ref 38–126)
BILIRUBIN DIRECT: 1 mg/dL — AB (ref 0.1–0.5)
BILIRUBIN INDIRECT: 0.7 mg/dL (ref 0.3–0.9)
TOTAL PROTEIN: 4.1 g/dL — AB (ref 6.5–8.1)
Total Bilirubin: 1.7 mg/dL — ABNORMAL HIGH (ref 0.3–1.2)

## 2016-02-10 LAB — PREPARE RBC (CROSSMATCH)

## 2016-02-10 LAB — ACETAMINOPHEN LEVEL

## 2016-02-10 LAB — LACTIC ACID, PLASMA: Lactic Acid, Venous: 10 mmol/L (ref 0.5–2.0)

## 2016-02-10 SURGERY — EGD (ESOPHAGOGASTRODUODENOSCOPY)
Anesthesia: Moderate Sedation

## 2016-02-10 SURGERY — LAPAROTOMY, EXPLORATORY
Anesthesia: General | Site: Abdomen

## 2016-02-10 MED ORDER — PIPERACILLIN-TAZOBACTAM 3.375 G IVPB 30 MIN
3.3750 g | Freq: Four times a day (QID) | INTRAVENOUS | Status: DC
  Administered 2016-02-11 – 2016-02-22 (×44): 3.375 g via INTRAVENOUS
  Filled 2016-02-10 (×47): qty 50

## 2016-02-10 MED ORDER — MIDAZOLAM HCL 5 MG/5ML IJ SOLN
INTRAMUSCULAR | Status: DC | PRN
  Administered 2016-02-10 (×3): 2 mg via INTRAVENOUS

## 2016-02-10 MED ORDER — SODIUM CHLORIDE 0.9 % IV SOLN
Freq: Once | INTRAVENOUS | Status: AC
  Administered 2016-02-10: 10 mL/h via INTRAVENOUS

## 2016-02-10 MED ORDER — ALBUMIN HUMAN 5 % IV SOLN
25.0000 g | Freq: Once | INTRAVENOUS | Status: AC
  Administered 2016-02-10: 25 g via INTRAVENOUS
  Filled 2016-02-10: qty 500

## 2016-02-10 MED ORDER — LACTATED RINGERS IV SOLN
INTRAVENOUS | Status: DC | PRN
  Administered 2016-02-10 (×2): via INTRAVENOUS

## 2016-02-10 MED ORDER — PHENYLEPHRINE HCL 10 MG/ML IJ SOLN
0.0000 ug/min | INTRAVENOUS | Status: DC
  Administered 2016-02-10: 90 ug/min via INTRAVENOUS
  Administered 2016-02-10: 400 ug/min via INTRAVENOUS
  Administered 2016-02-11: 21:00:00 via INTRAVENOUS
  Administered 2016-02-11 – 2016-02-12 (×11): 400 ug/min via INTRAVENOUS
  Administered 2016-02-12: 130 ug/min via INTRAVENOUS
  Administered 2016-02-12 (×4): 400 ug/min via INTRAVENOUS
  Administered 2016-02-12: 380 ug/min via INTRAVENOUS
  Administered 2016-02-12 – 2016-02-13 (×4): 400 ug/min via INTRAVENOUS
  Administered 2016-02-13: 186.667 ug/min via INTRAVENOUS
  Administered 2016-02-13: 225 ug/min via INTRAVENOUS
  Administered 2016-02-13: 325 ug/min via INTRAVENOUS
  Administered 2016-02-13: 400 ug/min via INTRAVENOUS
  Administered 2016-02-14: 10.133 ug/min via INTRAVENOUS
  Filled 2016-02-10 (×38): qty 4

## 2016-02-10 MED ORDER — SODIUM CHLORIDE 0.9 % IV SOLN: INTRAVENOUS | Status: DC

## 2016-02-10 MED ORDER — HEMOSTATIC AGENTS (NO CHARGE) OPTIME
TOPICAL | Status: DC | PRN
  Administered 2016-02-10: 1 via TOPICAL

## 2016-02-10 MED ORDER — SODIUM CHLORIDE 0.9 % IV SOLN
2.0000 g | Freq: Once | INTRAVENOUS | Status: AC
  Administered 2016-02-10: 2 g via INTRAVENOUS
  Filled 2016-02-10: qty 20

## 2016-02-10 MED ORDER — FENTANYL CITRATE (PF) 100 MCG/2ML IJ SOLN
INTRAMUSCULAR | Status: DC | PRN
  Administered 2016-02-10 (×2): 50 ug via INTRAVENOUS
  Administered 2016-02-10: 100 ug via INTRAVENOUS
  Administered 2016-02-10: 50 ug via INTRAVENOUS

## 2016-02-10 MED ORDER — PIPERACILLIN-TAZOBACTAM 3.375 G IVPB
3.3750 g | Freq: Three times a day (TID) | INTRAVENOUS | Status: DC
  Administered 2016-02-10 (×2): 3.375 g via INTRAVENOUS
  Filled 2016-02-10 (×3): qty 50

## 2016-02-10 MED ORDER — SODIUM BICARBONATE 8.4 % IV SOLN
INTRAVENOUS | Status: DC
  Administered 2016-02-10 – 2016-02-14 (×8): via INTRAVENOUS
  Filled 2016-02-10 (×15): qty 100

## 2016-02-10 MED ORDER — PHENYLEPHRINE HCL 10 MG/ML IJ SOLN
0.0000 ug/min | INTRAMUSCULAR | Status: DC
  Administered 2016-02-10: 50 ug/min via INTRAVENOUS
  Filled 2016-02-10: qty 1

## 2016-02-10 MED ORDER — DEXTROSE 50 % IV SOLN
1.0000 | Freq: Once | INTRAVENOUS | Status: AC
  Administered 2016-02-10: 50 mL via INTRAVENOUS
  Filled 2016-02-10: qty 50

## 2016-02-10 MED ORDER — SODIUM BICARBONATE 8.4 % IV SOLN
50.0000 meq | Freq: Once | INTRAVENOUS | Status: AC
  Administered 2016-02-10: 50 meq via INTRAVENOUS
  Filled 2016-02-10: qty 50

## 2016-02-10 MED ORDER — MIDAZOLAM HCL 2 MG/2ML IJ SOLN
INTRAMUSCULAR | Status: AC
  Filled 2016-02-10: qty 2

## 2016-02-10 MED ORDER — SODIUM BICARBONATE 8.4 % IV SOLN
INTRAVENOUS | Status: DC | PRN
  Administered 2016-02-10: 150 meq via INTRAVENOUS
  Administered 2016-02-10 (×3): 50 meq via INTRAVENOUS
  Administered 2016-02-10: 100 meq via INTRAVENOUS

## 2016-02-10 MED ORDER — IPRATROPIUM-ALBUTEROL 0.5-2.5 (3) MG/3ML IN SOLN
3.0000 mL | Freq: Four times a day (QID) | RESPIRATORY_TRACT | Status: DC
  Administered 2016-02-10 – 2016-02-24 (×54): 3 mL via RESPIRATORY_TRACT
  Filled 2016-02-10 (×53): qty 3

## 2016-02-10 MED ORDER — SODIUM CHLORIDE 0.9 % IV SOLN: Freq: Once | INTRAVENOUS | Status: AC

## 2016-02-10 MED ORDER — PHENYLEPHRINE HCL 10 MG/ML IJ SOLN
INTRAMUSCULAR | Status: DC | PRN
  Administered 2016-02-10 (×2): 80 ug via INTRAVENOUS

## 2016-02-10 MED ORDER — SODIUM CHLORIDE 0.9 % IV SOLN
INTRAVENOUS | Status: DC | PRN
  Administered 2016-02-10: 19:00:00 via INTRAVENOUS

## 2016-02-10 MED ORDER — FENTANYL CITRATE (PF) 250 MCG/5ML IJ SOLN
INTRAMUSCULAR | Status: AC
  Filled 2016-02-10: qty 5

## 2016-02-10 MED ORDER — INSULIN ASPART 100 UNIT/ML ~~LOC~~ SOLN
10.0000 [IU] | Freq: Once | SUBCUTANEOUS | Status: AC
  Administered 2016-02-10: 10 [IU] via SUBCUTANEOUS

## 2016-02-10 MED ORDER — SODIUM CHLORIDE 0.9 % IV SOLN: Freq: Once | INTRAVENOUS | Status: DC

## 2016-02-10 MED ORDER — SODIUM BICARBONATE 8.4 % IV SOLN
100.0000 meq | Freq: Once | INTRAVENOUS | Status: AC
  Administered 2016-02-10: 100 meq via INTRAVENOUS
  Filled 2016-02-10: qty 50

## 2016-02-10 MED ORDER — 0.9 % SODIUM CHLORIDE (POUR BTL) OPTIME
TOPICAL | Status: DC | PRN
  Administered 2016-02-10 (×4): 1000 mL

## 2016-02-10 MED ORDER — ROCURONIUM BROMIDE 100 MG/10ML IV SOLN
INTRAVENOUS | Status: DC | PRN
  Administered 2016-02-10: 50 mg via INTRAVENOUS

## 2016-02-10 MED ORDER — SODIUM CHLORIDE 0.9 % IV SOLN
Freq: Once | INTRAVENOUS | Status: AC
Start: 2016-02-10 — End: 2016-02-10
  Administered 2016-02-10: 10 mL/h via INTRAVENOUS

## 2016-02-10 SURGICAL SUPPLY — 43 items
BLADE SURG ROTATE 9660 (MISCELLANEOUS) IMPLANT
CANISTER SUCTION 2500CC (MISCELLANEOUS) ×3 IMPLANT
CANISTER WOUND CARE 500ML ATS (WOUND CARE) ×3 IMPLANT
CHLORAPREP W/TINT 26ML (MISCELLANEOUS) ×3 IMPLANT
COVER SURGICAL LIGHT HANDLE (MISCELLANEOUS) ×3 IMPLANT
DRAPE LAPAROSCOPIC ABDOMINAL (DRAPES) ×3 IMPLANT
DRAPE WARM FLUID 44X44 (DRAPE) ×3 IMPLANT
DRSG OPSITE POSTOP 4X10 (GAUZE/BANDAGES/DRESSINGS) IMPLANT
DRSG OPSITE POSTOP 4X8 (GAUZE/BANDAGES/DRESSINGS) IMPLANT
DRSG PAD ABDOMINAL 8X10 ST (GAUZE/BANDAGES/DRESSINGS) ×6 IMPLANT
ELECT BLADE 6.5 EXT (BLADE) IMPLANT
ELECT CAUTERY BLADE 6.4 (BLADE) ×3 IMPLANT
ELECT REM PT RETURN 9FT ADLT (ELECTROSURGICAL) ×3
ELECTRODE REM PT RTRN 9FT ADLT (ELECTROSURGICAL) ×1 IMPLANT
GAUZE SPONGE 4X4 12PLY STRL (GAUZE/BANDAGES/DRESSINGS) ×6 IMPLANT
GLOVE BIO SURGEON STRL SZ8 (GLOVE) ×6 IMPLANT
GLOVE BIOGEL PI IND STRL 8 (GLOVE) ×1 IMPLANT
GLOVE BIOGEL PI INDICATOR 8 (GLOVE) ×2
GOWN STRL REUS W/ TWL LRG LVL3 (GOWN DISPOSABLE) ×1 IMPLANT
GOWN STRL REUS W/ TWL XL LVL3 (GOWN DISPOSABLE) ×1 IMPLANT
GOWN STRL REUS W/TWL LRG LVL3 (GOWN DISPOSABLE) ×2
GOWN STRL REUS W/TWL XL LVL3 (GOWN DISPOSABLE) ×2
KIT BASIN OR (CUSTOM PROCEDURE TRAY) ×3 IMPLANT
KIT ROOM TURNOVER OR (KITS) ×3 IMPLANT
LIGASURE IMPACT 36 18CM CVD LR (INSTRUMENTS) IMPLANT
NS IRRIG 1000ML POUR BTL (IV SOLUTION) ×12 IMPLANT
PACK GENERAL/GYN (CUSTOM PROCEDURE TRAY) ×3 IMPLANT
PAD ARMBOARD 7.5X6 YLW CONV (MISCELLANEOUS) ×3 IMPLANT
SEALANT PATCH FIBRIN 2X4IN (MISCELLANEOUS) ×3 IMPLANT
SPECIMEN JAR LARGE (MISCELLANEOUS) IMPLANT
SPONGE ABDOMINAL VAC ABTHERA (MISCELLANEOUS) ×3 IMPLANT
SPONGE LAP 18X18 X RAY DECT (DISPOSABLE) ×6 IMPLANT
STAPLER VISISTAT 35W (STAPLE) ×3 IMPLANT
SUCTION POOLE TIP (SUCTIONS) ×3 IMPLANT
SUT PDS AB 1 TP1 96 (SUTURE) IMPLANT
SUT SILK 2 0 SH CR/8 (SUTURE) ×3 IMPLANT
SUT SILK 2 0 TIES 10X30 (SUTURE) ×3 IMPLANT
SUT SILK 3 0 SH CR/8 (SUTURE) ×3 IMPLANT
SUT SILK 3 0 TIES 10X30 (SUTURE) ×3 IMPLANT
TOWEL OR 17X24 6PK STRL BLUE (TOWEL DISPOSABLE) ×3 IMPLANT
TOWEL OR 17X26 10 PK STRL BLUE (TOWEL DISPOSABLE) ×3 IMPLANT
TRAY FOLEY CATH 16FRSI W/METER (SET/KITS/TRAYS/PACK) IMPLANT
YANKAUER SUCT BULB TIP NO VENT (SUCTIONS) ×3 IMPLANT

## 2016-02-10 NOTE — Progress Notes (Signed)
Patient ID: Phylliss BobKatia D Tauer, female   DOB: 1981-06-28, 35 y.o.   MRN: [id number] Acidosis much worse despite aggressive interventions today. CVP remains 20 so does not seem like large ongoing hemorrhage. Lactate pending. I suspect a septic source, possible dead bowel. Will take for emergent ex lap, possible bowel resection. I will speak with her sister. [name] Tacori Kvamme, MD, MPH, FACS Trauma: [phone] General Surgery: [phone]

## 2016-02-10 NOTE — Progress Notes (Signed)
CRITICAL VALUE ALERT  Critical value received:Hgb 3.6  Date of notification:  02/09/2016  Time of notification:  2109  Critical value read back:Yes.    Nurse who received alert:  Alphia Mohevon Braedyn Kauk  MD notified (1st page):  Trauma MD, Derrell Lollingamirez  Time of first page:  2110  MD notified (2nd page):  Time of second page:  Responding MD:  Trauma MD, Tyron Russellamiez  Time MD responded:  2110

## 2016-02-10 NOTE — Progress Notes (Signed)
Patient ID: Phylliss BobKatia D Stfort, female   DOB: 11/04/1981, 35 y.o.   MRN: [id number] MAP low. Base deficit has worsened. Add neo and albumin bolus. CBC pending now. Remains awake on vent. EGD to be done at bedside shortly.  [name] Teaghan Formica, MD, MPH, FACS Trauma: [phone] General Surgery: [phone]

## 2016-02-10 NOTE — Anesthesia Postprocedure Evaluation (Signed)
Anesthesia Post Note  Patient: [name] Bell  Procedure(s) Performed: Procedure(s) (LRB): EXPLORATORY LAPAROTOMY, removal of packs,  cauterization of liver, repacking of liver, and open abdomen vac application (N/A)  Patient location during evaluation: SICU Anesthesia Type: General Level of consciousness: sedated Pain management: pain level controlled Vital Signs Assessment: post-procedure vital signs reviewed and stable Respiratory status: patient remains intubated per anesthesia plan Cardiovascular status: stable Anesthetic complications: no    Last Vitals:  Filed Vitals:   02/10/16 2325 02/10/16 2326  BP:    Pulse: 128 127  Temp: 36.4 C   Resp: 13 10    Last Pain: There were no vitals filed for this visit.               Kamila Broda DAVID

## 2016-02-10 NOTE — Progress Notes (Signed)
Patient ID: [name] Bell, female   DOB: Sep 26, 1981, 35 y.o.   MRN: [id number] BP has improved some. Will give 2 amp bicarb and TF 1u PRBC. EGD by Dr. Myrtie Neitheranis did not show significant esophageal damage. [name] Bryam Taborda, MD, MPH, FACS Trauma: [phone] General Surgery: [phone]

## 2016-02-10 NOTE — Progress Notes (Signed)
Initial Nutrition Assessment  DOCUMENTATION CODES:   Obesity unspecified  INTERVENTION:   Continue to monitor for ability to start enteral nutrition support.   NUTRITION DIAGNOSIS:   Increased nutrient needs related to wound healing, catabolic illness as evidenced by estimated needs.  GOAL:   Provide needs based on ASPEN/SCCM guidelines  MONITOR:   Skin, I & O's, Vent status, Labs  REASON FOR ASSESSMENT:   Ventilator    ASSESSMENT:   Pt from [place] with no past medical hx admitted after suicide attempt by ingestion of Drano, 2 bottles of acetaminophen, nyquil, and possibly 6 alprazolam pills and jumping from 5th floor balcony of hotel. 4/3 pt is s/p exp lap, hepatorraphy, SBR, cholecystectomy, preperitoneal pelvic packing, ex fix pelvis, R HPTX, Bil pulmonary contusions. Plan for repeat OR 4/5 for another possible SBR. Abdomen remains open.    Patient is currently intubated on ventilator support MV: 14.6 L/min Temp (24hrs), Avg:99.2 F (37.3 C), Min:95.2 F (35.1 C), Max:100.8 F (38.2 C)  Pt discussed during ICU rounds and with RN.  Wound VAC in place on abdomen.  Pt has been hypotensive and unable to tolerate sedation.  MAP range 42 at 1200, currently 60 Massive transfusion completed but continues to receive blood products.  Pt is awake and alert.   Labs reviewed: AST 467, ALT 266 OG tube in place Once ready for TF: Pivot 1.5 @ 30 ml/hr with 60 ml Prostat TID = 1680 kcal, 157 grams protein, and 546 ml H2O.  Nutrition-Focused physical exam completed. Findings are no fat depletion, no muscle depletion, and mild edema.    Diet Order:  Diet NPO time specified  Skin:  Wound (see comment) (lacerations, abrasions, surgical incisions, negative pressure dressing)  Last BM:  unknown  Height:   Ht Readings from Last 1 Encounters:  02/09/16 5\' 11"  (1.803 m)    Weight:   Wt Readings from Last 1 Encounters:  02/09/16 281 lb 15.5 oz (127.9 kg)    Ideal Body Weight:   70.4 kg  BMI:  Body mass index is 39.34 kg/(m^2).  Estimated Nutritional Needs:   Kcal:  1406-1790  Protein:  >/= 140 grams  Fluid:  > 2 L/day  EDUCATION NEEDS:   No education needs identified at this time  Kendell BaneHeather Donal Lynam RD, LDN, CNSC [phone] Pager [phone] After Hours Pager

## 2016-02-10 NOTE — Op Note (Signed)
NAMLesle Chris:  Navia, Pearley                ACCOUNT NO.:  [id number]  MEDICAL RECORD NO.:  [id number]  LOCATION:  3M08C                        FACILITY:  [facility]  PHYSICIAN:  Doralee AlbinoMichael H. Carola FrostHandy, M.D. DATE OF BIRTH:  11/04/1982  DATE OF PROCEDURE:  02/09/2016 DATE OF DISCHARGE:                              OPERATIVE REPORT   PREOPERATIVE DIAGNOSES: 1. Pelvic ring disruption. 2. Hypertension.  POSTOPERATIVE DIAGNOSES: 1. Pelvic ring disruption. 2. Hypertension. 3. Liver laceration. 4. Peritoneal bleed. 5. Mesenteric tear. 6. Hematuria. 7. Suspected greater trochanteric fracture.  PROCEDURES:  Panel #1 1. Exploratory laparotomy. 2. Cholecystectomy. 3. Small bowel resection, mesenteric repair. 4. Application of wound VAC. 5. Repair of liver laceration.  Panel #2 1. Closed reduction of pelvic ring. 2. Application of external fixator, pelvis. 3. Stress fluoro of the right hip under anesthesia.  SURGEON:  Panel #1, Jimmye NormanJames [name], M.D.; assistant [name] Thompson MD.  Panel #2, Myrene GalasMichael Launi Asencio, M.D.; assistant Sol PasserKarla Bathun, RNFA.  ANESTHESIA:  General, Marcene Duosobert Fitzgerald, M.D.  EBL:  Minimal from orthopedic procedure.  Total receipts of blood products from the time of admission:  16 units PRBC, 14 units FFP, 1 pack of platelets with more en route, 1 cryoprecipitate.  SPECIMENS:  None.  DISPOSITION:  To CT scanner.  CONDITION:  Critical.  BRIEF SUMMARY OF INDICATIONS FOR PROCEDURE:  [name] PenmanKatia Mervin is a 35 year old female, suicide jumper that le[place] off the fifth [address] [place] [address] [place] multiple [facility].  The patient was able to verbally respond and interact with emergency personnel on route to the [facility].  I was contacted emergently by the Trauma Service.  After fast examination, they were requesting an emergent evaluation for the unstable pelvic ring fractures, as the patient was then transported to the operating room for exploratory laparotomy.  There is no  family immediately available for emergent consent.  BRIEF SUMMARY OF PROCEDURE:  The patient again was taken care of under emergent consent procedure.  The general surgeons were in the room with a laparotomy underway when I performed a rapid evaluation to the extent feasible.  I prepped and draped, followed with Betadine scrub and paint. Over the distal edge of the pelvis making small stab incision just vertically below the tip of the anterior superior iliac spine, I was able to introduce a drill guide with the stylet in place to palpate the edge of the iliac wing and then established a starting point and trajectory.  This was followed by placement of the long threaded 5 mm pins that were then inserted down the vein toward the posterior inferior iliac spine.  This was usually performed on the right; however, because the patient was positioned tilted and on a non partially radiolucent table it was much more difficult for the contralateral left side, I was able to achieve the appropriate position which I confirmed as best as possible on the C-arm.  I then performed a closed reduction and had my assistant, Paula ComptonKarla tighten the clamps.  We had excellent stability with the frame and gross instability prior to its being secured.  C-arm was then brought to the right hip which was taken through a range of motion carefully evaluating  the femoral neck.  It appeared to be an abnormality in or around the neck, and on close inspection, however did not identify any fracture except for what appeared to be one along the outer cortex of the greater trochanter.  There certainly was no abnormal motion at the femoral neck.  Consequently, the patient was then taken emergently to the CT scanner per the request and direction of Dr. Megan Mans who also was present intermittently in the operating room, during my portion of the procedure.  PROGNOSIS:  Ms. Pinnock remains at very high risk for mortality as well as  morbidity.  We will continue to follow closely and will evaluate the right ankle as well as the left risk for fractures requiring additional attention.  The peritoneal cavity as impacted we anticipate return to the OR tomorrow or the next day.  Currently, last base deficit reading was 23 and aggressive resuscitation is underway.     Doralee Albino. [name], M.D.     MHH/MEDQ  D:  02/09/2016  T:  02/10/2016  Job:  [id number]

## 2016-02-10 NOTE — Op Note (Signed)
[facility] Patient Name: [name] Bell Procedure Date : 02/10/2016 MRN: [id number] Attending MD: Starr Lake. Myrtie Neither , MD Date of Birth: 12/29/1980 CSN: [id number] Age: 35 Admit Type: Inpatient Procedure:                Upper GI endoscopy Indications:              Endoscopy to assess acute esophageal injury after                            caustic ingestion Providers:                Sherilyn Cooter L. Myrtie Neither, MD, Michel Bickers, RN, Clearnce Sorrel, Technician Referring MD:              Medicines:                None Complications:            No immediate complications. Estimated Blood Loss:     Estimated blood loss: none. Procedure:                Pre-Anesthesia Assessment:                           - Prior to the procedure, a History and Physical                            was performed, and patient medications and                            allergies were reviewed. The patient's tolerance of                            previous anesthesia was also reviewed. The risks                            and benefits of the procedure and the sedation                            options and risks were discussed with the patient.                            All questions were answered, and informed consent                            was obtained. Prior Anticoagulants: The patient has                            taken no previous anticoagulant or antiplatelet                            agents. ASA Grade Assessment: IV - A patient with  severe systemic disease that is a constant threat                            to life. After reviewing the risks and benefits,                            the patient was deemed in satisfactory condition to                            undergo the procedure.                           After obtaining informed consent, the endoscope was                            passed under direct vision. Throughout the           procedure, the patient's blood pressure, pulse, and                            oxygen saturations were monitored continuously. The                            [id number]) scope was introduced through the                            mouth, and advanced to the second part of duodenum.                            The upper GI endoscopy was accomplished without                            difficulty. The patient tolerated the procedure                            well. Scope In: Scope Out: Findings:      The esophagus was normal.      One non-bleeding linear gastric ulcer with no stigmata of bleeding was       found on the lesser curvature of the stomach. The lesion was 20 mm in       largest dimension. It appears acute, possibly due to ingestion (though       that is typically on the [facility]), or more likely OGT.      The examined duodenum was normal. Impression:               - Normal esophagus.                           - Non-bleeding gastric ulcer with no stigmata of                            bleeding.                           - Normal examined duodenum.                           -  No specimens collected. Moderate Sedation:      no sedation due to hypotension Recommendation:           - Return patient to ICU for ongoing care.                           - NPO.                           - Continue present medications. Procedure Code(s):        --- Professional ---                           43235, [id number], flexible,                            transoral; diagnostic, including collection of                            specimen(s) by brushing or washing, when performed                            (separate procedure) Diagnosis Code(s):        --- Professional ---                           K25.9, Gastric ulcer, unspecified as acute or                            chronic, without hemorrhage or perforation                           T54.94XA, Toxic effect of unspecified  corrosive                            substance, undetermined, initial encounter CPT copyright 2016 American Medical Association. All rights reserved. The codes documented in this report are preliminary and upon coder review may  be revised to meet current compliance requirements. Jayce Boyko L. Myrtie Neitheranis, MD 02/10/2016 5:09:59 PM This report has been signed electronically. Number of Addenda: 0

## 2016-02-10 NOTE — OR Nursing (Signed)
Two 18 x 18 lap sponges were left in intentionally by Dr. Janee Mornhompson during the exploratory laparotomy, and the 8 sponges were removed that were left in on 02-09-2016.

## 2016-02-10 NOTE — Progress Notes (Signed)
4/03 @ 1936: Pt hypotensive, MAP consistently below 60. Trauma MD notified. Orders given for 1 liter NS bolus.   4/03 @ 2026: Previous liter bolus helped slightly and now patient hypotensive again. Notified Trauma MD. Orders given for 500cc bolus and labs to be drawn.   4/03 @ 2100: Pt hypotensive, difficult to arouse. Critical Hgb of 3.6 resulted. Notified MD. Orders given to redraw labs and rapidly transfuse 3 units RBC, 1 cyro, 2 platelets. Will administer and continue to monitor.   4/03 @ 2145: RN at bedside, pt began "guppy" like breathing; use of accessory muscle use. Pt sats then dropped to low 50s. RN began bagging patient until O2 improved. RT notified. Trauma MD notified. Orders for stat CXR. Dr. Derrell Lollingamirez at bedside. Orders given to RT, see note. PT chest tube has minimal drainage. Milked, still minimal drainage. MD aware.   4/03 @ 2325: Pt desat again to low 80s. RT notified. ABG collected. Trauma MD notified.   4/04 @ 0100: Pt Hgb 6.1. Ptt 18.6, INR 1.55. Results discussed with trauma MD. Orders given for 2 units RBC, 4 units FFP   4/04 @ 0515: Pt calcium 6.0. Labs discussed with Trauma MD. See MAR for orders. Pt R hip/leg more swollen, bruising increasing. Old bruising marked. R foot pulses dopplered. Extremity cool, tight. Notified and discussed with Trauma MD.   04/04 @0630 : Pt hypotensive. BP's discussed with Trauma MD. Orders given to decrease PEEP to 12. Notified RT.

## 2016-02-10 NOTE — Progress Notes (Signed)
CRITICAL VALUE ALERT  Critical value received: Calcium 6.0   Date of notification:  04/04  Time of notification:  0515  Critical value read back:Yes.    Nurse who received alert:  Alphia Mohevon Ara Mano, RN  MD notified (1st page):  Trauma MD  Time of first page:  0515  MD notified (2nd page):  Time of second page:  Responding MD:  Trauma, MD  Time MD responded:  [phone]

## 2016-02-10 NOTE — Progress Notes (Signed)
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  Requests removal of neck brace.   Objective: Temp:  [93.9 F (34.4 C)-100.8 F (38.2 C)] 99.7 F (37.6 C) (04/04 1000) Pulse Rate:  [99-123] 111 (04/04 1000) Resp:  [10-37] 32 (04/04 1000) BP: (69-158)/(39-139) 73/39 mmHg (04/04 1000) SpO2:  [84 %-100 %] 98 % (04/04 1000) Arterial Line BP: (60-130)/(32-81) 67/39 mmHg (04/04 1000) FiO2 (%):  [60 %-100 %] 80 % (04/04 1000) Weight:  [281 lb 15.5 oz (127.9 kg)] 281 lb 15.5 oz (127.9 kg) (04/03 1515)  Physical Exam drsgs with serosang drainage bilateral pelvic pins, edema Active flexion and extension of great and lesser toes and ankles bilaterally, including eversion DP palp bilaterally UEX: moving both wrists and hand well with tenderness of base of left 4th MC but also some in snuff box  Assessment/Plan: 1 Day Post-Op Procedure(s) (LRB): EXPLORATORY LAPAROTOMY (N/A) CHOLECYSTECTOMY SMALL BOWEL RESECTION, MESENTERIC REPAIR APPLICATION OF WOUND VAC REPAIR LIVER LACERATION EXTERNAL FIXATION PELVIS Left fourth metacarpal base fracture, nondisplaced Possible left scaphoid fracture   1. Anticipate need for CT scan of left wrist, but could repeat plain films after removal of all dressings in two days with possible improvement in visualization 2. Removable splint for now L wrist 3. Return to OR for H type suicidal jumper's pelvic fracture repair Th or Fri if hemodynamically stable with transsacral screws, possible anterior plating.  Extent of bladder injury undefined currently and will factor into treatment plan.  Communicated with Dr. Janee Mornhompson re neck brace.   Myrene GalasMichael Dontrey Snellgrove, MD [facility] [phone] (p)

## 2016-02-10 NOTE — H&P (View-Only) (Signed)
[facility] Consult: 9:40 AM 02/10/2016  LOS: 1 day    Referring Provider: Dr Hulen Skains Primary Care Physician:  No primary care provider on file. Primary Gastroenterologist:  Althia Forts.      Reason for Consultation:  Ingestion of Drano.    HPI: [name] Bell is a 35 y.o. female.  Her address is in [place] but she moved to [place] 3 weeks ago to be with family.  She is obese.  PMH unknown.    Pt attempted suicide: taped DNR note to chest, drank Drano, then jumped off 5 story building ([address]).   S/p 02/09/16 gel foam embolization of bil internal iliac arteries.  S/p 02/09/16 ex lap with cholecystectomy, resection SB and mesenteric repair, liver lac repair, application or wound vac.   S/p 02/09/16 closed reduction of pelvic ring, external pelvic fixation.   Also has right metacarpal fracture.  Hgb went from 10 to 3.6, not 8.2 post multiple blood products.  Platelet nadir 59, improved post product.  Protime/INR 13.1/2.0.  Transfused PRBCs x 5; FFP x 4; platelets x 2 ior 3, cryoprecipitate x 1.  Glucose in 300s.  ETOH level 6.  Remains intubated but is alert and able to communicate with nods.  Denies throat and chest pain. Note hypotension this AM. More blood products ordered, on pressors.  CXR today with no free or subcutaneous air.      History reviewed. No pertinent past medical history.  History reviewed. No pertinent past surgical history.  Prior to Admission medications   Not on File    Scheduled Meds: . sodium chloride   Intravenous Once  . sodium chloride   Intravenous Once  . sodium chloride   Intravenous Once  . sodium chloride   Intravenous Once  . sodium chloride   Intravenous Once  . sodium chloride   Intravenous Once  . antiseptic oral rinse  7 mL Mouth Rinse 10 times per  day  . chlorhexidine gluconate (SAGE KIT)  15 mL Mouth Rinse BID  . fentaNYL (SUBLIMAZE) injection  50 mcg Intravenous Once  . ipratropium-albuterol  3 mL Nebulization Q6H  . norepinephrine (LEVOPHED) Adult infusion  0-40 mcg/min Intravenous To OR  . pantoprazole  40 mg Oral Daily   Or  . pantoprazole (PROTONIX) IV  40 mg Intravenous Daily  . tranexamic acid (CYKLOKAPRON) infusion (TRAUMA)  1,000 mg Intravenous Once   Infusions: . sodium chloride 150 mL/hr at 02/10/16 0800  . fentaNYL infusion INTRAVENOUS Stopped (02/10/16 0645)  . norepinephrine (LEVOPHED) Adult infusion 40 mcg/min (02/10/16 0800)  . propofol (DIPRIVAN) infusion Stopped (02/09/16 1753)  . vasopressin (PITRESSIN) infusion - *FOR SHOCK* 0.03 Units/min (02/10/16 0800)   PRN Meds: fentaNYL, ondansetron **OR** ondansetron (ZOFRAN) IV   Allergies as of 02/09/2016  . (Not on File)    No family history on file.  Social History   Social History  . Marital Status: Unknown    Spouse Name: N/A  . Number of Children: N/A  . Years of Education: N/A   Occupational History  . Not on  file.   Social History Main Topics  . Smoking status: Unknown If Ever Smoked  . Smokeless tobacco: Not on file  . Alcohol Use: Not on file     Comment: unknown  . Drug Use: Not on file  . Sexual Activity: Not on file   Other Topics Concern  . Not on file   Social History Narrative  . No narrative on file    REVIEW OF SYSTEMS: Unable to complete much of thi.  Pt denies hx of bleeding, egd/colonoscopy, diabetes or elevated glucose  PHYSICAL EXAM: Vital signs in last 24 hours: Filed Vitals:   02/10/16 0830 02/10/16 0832  BP: 81/56 86/45  Pulse: 114 115  Temp: 99.7 F (37.6 C)   Resp: 31 32   Wt Readings from Last 3 Encounters:  02/10/2016 127.9 kg (281 lb 15.5 oz)    General: obese AAF with swollen face, intubated and hooked up to multiple pumps Head:  + trauma and swelling on head.    Eyes:  Swollen eyelids Ears:  Not  HOH  Nose:  No blood per nares Mouth:  intubated Neck:  cspine collar in place Lungs:  Clear in front.  Intubated on vent but no labored breathing.  Right chest tube in place Heart: RRR Abdomen: quiet, wound vac in place.  Pelvic stablizer halo in place.  Did not apply pressure or assess for pain.  Rectal: not performed   Musc/Skeltl: pelvic fixator in place. Bandages on wrist and lower extremities Extremities:  + edema  Neurologic:  Follows commands. Limb function and strength not tested.   Intake/Output from previous day: February 10, 2023 0701 - 04/04 0700 In: 21992 [I.V.:16901; MHDQQ:2297; IV Piggyback:2120] Out: 65 [Urine:2090; Emesis/NG output:50; Drains:2900; Blood:200; Chest Tube:670] Intake/Output this shift: Total I/O In: 913.8 [I.V.:913.8] Out: -   LAB RESULTS:  Recent Labs  Feb 10, 2016 2116 02/10/16 0100 02/10/16 0405  WBC 3.1* 4.2 6.0  HGB 3.8* 6.1* 8.2*  HCT 11.6* 17.9* 24.0*  PLT 78* 145* 129*   BMET Lab Results  Component Value Date   NA 141 02/10/2016   NA 146* 10-Feb-2016   NA 144 02-10-2016   K 4.2 02/10/2016   K 4.2 2016/02/10   K 6.3* February 10, 2016   CL 113* 02/10/2016   CL 116* 10-Feb-2016   CL 107 02-10-2016   CO2 15* 02/10/2016   CO2 18* Feb 10, 2016   CO2 10* 2016-02-10   GLUCOSE 86 02/10/2016   GLUCOSE 248* Feb 10, 2016   GLUCOSE 368* 2016/02/10   BUN 8 02/10/2016   BUN 6 2016-02-10   BUN 4* 02/10/16   CREATININE 1.22* 02/10/2016   CREATININE 0.98 2016/02/10   CREATININE 1.20* 10-Feb-2016   CALCIUM 6.0* 02/10/2016   CALCIUM 6.6* February 10, 2016   CALCIUM 8.3* 2016-02-10   LFT  Recent Labs  February 10, 2016 0829 2016/02/10 1555 02/10/16 0405  PROT 5.9* 3.8* 4.2*  ALBUMIN 3.2* 2.1* 2.4*  AST 595* 208* 467*  ALT 456* 144* 266*  ALKPHOS 43 35* 43  BILITOT 0.7 0.6 1.2   PT/INR Lab Results  Component Value Date   INR 1.63* 02/10/2016   INR 1.55* 02/10/2016   INR 1.83* 2016-02-10   Hepatitis Panel No results for input(s): HEPBSAG, HCVAB, HEPAIGM,  HEPBIGM in the last 72 hours. C-Diff No components found for: CDIFF Lipase  No results found for: LIPASE  Drugs of Abuse  No results found for: LABOPIA, COCAINSCRNUR, LABBENZ, AMPHETMU, THCU, LABBARB   RADIOLOGY STUDIES: Dg Wrist Complete Left  February 10, 2016  CLINICAL DATA:  Fall. EXAM: LEFT  WRIST - COMPLETE 3+ VIEW COMPARISON:  None. FINDINGS: Exam detail is diminished due to overlying support material. There is a fracture deformity which extends through the base second metacarpal bone. IMPRESSION: 1. There is a fracture involving the base of the second metacarpal bone. Recommend repeat imaging following removal overlying tubes and lines. Electronically Signed   By: Kerby Moors M.D.   On: 02/09/2016 18:08   Ct Chest W Contrast Ct Abdomen Pelvis W Contrast  02/09/2016  CLINICAL DATA:  Level 1 trauma. [place] off a fifth floor balcony. Hypotension and abdominal pain. Intubated in the emergency department and taken to the operating room for exploratory laparotomy. Initial encounter. EXAM: CT CHEST, ABDOMEN, AND PELVIS WITH CONTRAST TECHNIQUE: Multidetector CT imaging of the chest, abdomen and pelvis was performed following the standard protocol during bolus administration of intravenous contrast. CONTRAST:  100 mL Isovue-300 COMPARISON:  Chest and pelvis radiographs earlier today FINDINGS: CT CHEST An endotracheal tube is in place and terminates 11.5 cm above the carina. An enteric tube terminates in the stomach. A right jugular catheter is partially visualized terminating near the confluence of the brachiocephalic veins. Hematoma is partially visualized in the right lower neck adjacent to the right thyroid lobe and anterior to the right common carotid artery. There is also hematoma in the right supraclavicular region and right chest wall. There are fractures of the right second through twelfth ribs, many of which are fractured in a segmental fashion. The most displaced right-sided rib fracture involves the  posterior right eleventh rib with 1.6 cm of displacement. There fractures of the left first and second ribs. There are small bilateral pneumothoraces, right larger than left. A chest tube terminates in the right upper hemi thorax and may be within the major fissure. Extensive bilateral lung opacities likely reflect a combination of collapse and contusions, greatest in the lower lobes and right upper lobe. There is a small right hemothorax, and there is also trace left pleural fluid/blood. There is diffuse haziness/hematoma in the mediastinum without definite evidence of thoracic aortic injury. CT ABDOMEN AND PELVIS There is a large liver laceration which involves the right greater than left lobes and extends through the hilum. There is attenuation of intrahepatic portal venous branches. No definite active extravasation is identified in/adjacent to the liver. The spleen is suboptimally evaluated due to motion and streak artifact. There is a small amount of perisplenic free fluid. There is hyperenhancement of both adrenal glands. A laceration in the medial upper pole of the right kidney measures approximately 4.0 x 3.1 cm without definite active extravasation in this area. There is a small right perinephric hematoma which partially surrounds the main renal artery and vein which are grossly patent. No acute left renal injury is identified. There is retroperitoneal/peripancreatic fluid with poor delineation of the pancreatic head. This also obscures portions of the duodenum. Sequelae of recent laparotomy are identified with a large midline abdominal wound. There is mucosal hyperenhancement involving multiple bowel loops. Sequelae of small bowel resection are identified with an anastomosis in the midline just deep to the open and wound. Moderate volume intraperitoneal free fluid is present. There is intraperitoneal free air related to recent surgery. Sponges are present in the right upper quadrant along the anterior  inferior surface of the liver as well as in the left lower quadrant. A Foley catheter is in place and the bladder is decompressed. The abdominal aorta is patent, however there is evidence of focal injury at the celiac origin with a 10  x 3 x 15 mm outpouching at the origin and short segment occlusion of the proximal celiac artery over a length of 8 mm. The remainder of the abdominal aorta appears intact. The SMA, renal arteries, IMA, and iliac arteries are grossly patent. There are transverse process fractures on the right at L1, bilaterally at L2, bilaterally at L3, on the left at L4, and on the left at L5. There are comminuted bilateral sacral fractures. Bilateral external fixation devices have been placed into the iliac bones. There are bilateral superior and inferior pubic rami fractures. There is extraluminal contrast posterior to the left inferior ramus fracture consistent with active extravasation. There is also a 2.5 cm focus of extravasation posterior to the right hip with large amount of surrounding hematoma in the gluteal region extending into the upper thigh. There is diastasis of the pubic symphysis. A 3.8 cm lucent lesion with well-defined sclerotic margin is present in the right femoral neck, nonspecific but without aggressive features. Hematoma is partially visualized in the subcutaneous tissues of the right flank extending into the lateral right upper thigh. IMPRESSION: 1. 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 2. Multiple pelvic fractures with active extravasation adjacent to the left inferior pubic ramus fracture and posterior to the right hip. 3. Bilateral hemopneumothoraces, right larger than left. Right chest tube in place. 4. Bilateral pulmonary contusions and collapse. 5. Right second through twelfth and left first and second rib fractures. Multiple right-sided ribs are displaced and fractured segmentally. 6. Large liver  laceration. 7. Right renal laceration. 8. Retroperitoneal hematoma which could reflect pancreatic or duodenal injury. 9. Sequelae of exploratory laparotomy with moderate volume intraperitoneal fluid/hematoma. Right upper quadrant and left lower quadrant sponges. 10. Evidence of hypovolemia including shock bowel. 11. Multiple lumbar spine transverse process fractures. Critical Value/emergent results were called by telephone at the time of interpretation on 02/09/2016 at 2:05 pm to Silvestre Gunner, PA, who verbally acknowledged these results. Electronically Signed   By: Logan Bores M.D.   On: 02/09/2016 14:23   Ir Angiogram Pelvis Selective Or Supraselective  02/09/2016  CLINICAL DATA:  Comminuted and open pelvic fractures post blunt trauma. underwent pelvic external fixation device placement and intraoperative control of liver laceration. Postop CT demonstrates 2 foci of continued active arterial extravasation proximal to pelvic fractures. Embolization requested. EXAM: IR EMBO ART  VEN HEMORR LYMPH EXTRAV  INC GUIDE ROADMAPPING ANESTHESIA/SEDATION: Patient arrived intubated and under hemodynamic control by the Department of Anesthesia personnel. MEDICATIONS: Lidocaine 1% subcutaneous CONTRAST:  122m OMNIPAQUE IOHEXOL 300 MG/ML  SOLN FLUOROSCOPY TIME:  4.5 minutes, 1403 mGy PROCEDURE: Due to the emergent nature of the situation, usual informed consent was deferred. Due to an indwelling right femoral venous catheter, the left femoral region prepped and draped in usual sterile fashion. Maximal barrier sterile technique was utilized including caps, mask, sterile gowns, sterile gloves, sterile drape, hand hygiene and skin antiseptic. The left common femoral artery was localized under ultrasound. Patency confirmed and documented. Under real-time ultrasound guidance, the vessel was accessed with a 21-gauge micropuncture needle, exchanged over a 018 guidewire for a transitional dilator, through which a 035 guidewire was  advanced. Over this, a 5 [place] sheath was placed, through which a 5 [place] catheter was advanced and used to selectively catheterize the right common iliac artery for selective pelvic arteriography. The C2 catheter was advanced with the aid of the BPrisma Health Baptistwire into the distal internal iliac artery. Confirmatory  arteriography was performed. Embolization of internal iliac artery branches performed through the C2 catheter using a slurry of Gel-Foam pledgets in dilute contrast. The C2 catheter was then removed, flushed, and reintroduced into the right common iliac artery for confirmatory pelvic arteriography. A Waltman loop was formed with the C2 catheter, and the left common iliac artery was catheterized antegrade for selective pelvic arteriography. The C2 was advanced into the left internal iliac artery. Confirmatory arteriography was performed. Embolization of left internal iliac artery branches performed through the C2 catheter using a slurry of Gel-Foam pledgets and dilute contrast. The C2 catheter was then removed, flushed, and reintroduced into the left common iliac artery for confirmatory pelvic arteriography. The C2 was removed. A final left femoral arteriogram performed to document sheath placement. The sheath was secured externally with a 0 silk suture and flushed with saline. The patient tolerated the procedure well. COMPLICATIONS: None immediate FINDINGS: Technically successful empiric Gel-Foam embolization of bilateral internal iliac artery territories, without immediate complication. Due to the patient's coagulopathy and hemodynamic instability, the left femoral arterial catheter was secured in place as art line. IMPRESSION: 1. Technically successful bilateral internal iliac artery Gel-Foam embolization. Electronically Signed   By: Lucrezia Europe M.D.   On: 02/09/2016 14:18   Dg Pelvis Portable  02/09/2016  CLINICAL DATA:  [place] from several feet.  Right pelvis injury. EXAM: PORTABLE PELVIS 1-2  VIEWS COMPARISON:  None. FINDINGS: Fractures are noted through the right superior and inferior pubic rami. Possible fracture through the left inferior pubic ramus. There is widening of the pubic symphysis. SI joints appear symmetric and unremarkable. IMPRESSION: Right superior and inferior pubic rami fractures. Possible fracture through the left inferior pubic ramus with diastases of the pubic symphysis. Electronically Signed   By: Rolm Baptise M.D.   On: 02/09/2016 09:30   Dg Pelvis 3v [place]  02/09/2016  CLINICAL DATA:  Pelvic trauma, fell out of a window 5 floors up, external fixator placement EXAM: DG C-ARM 61-120 MIN; JUDET PELVIS - 3+ VIEW COMPARISON:  Pelvic radiograph 02/09/2016 FLUOROSCOPY TIME:  1 minutes 21 seconds Images obtained:  8 FINDINGS: Images demonstrate placement of external fixator anchors at BILATERAL iliac bones. Displaced fractures of the RIGHT superior and inferior pubic rami noted. Disruption of pubic symphysis. Hips appear normally located. LEFT superior and inferior pubic rami fractures are less well visualized than on the prior study. RIGHT femoral line noted. Sponges project over pelvis. IMPRESSION: Placement of external fixation anchors at the iliac bones bilaterally. Pelvic fractures are better visualized on the preceding study. Electronically Signed   By: Lavonia Dana M.D.   On: 02/09/2016 13:24   Ir US Guide Vasc Access Left  02/09/2016  CLINICAL DATA:  Comminuted and open pelvic fractures post blunt trauma. underwent pelvic external fixation device placement and intraoperative control of liver laceration. Postop CT demonstrates 2 foci of continued active arterial extravasation proximal to pelvic fractures. Embolization requested. EXAM: IR EMBO ART  VEN HEMORR LYMPH EXTRAV  INC GUIDE ROADMAPPING ANESTHESIA/SEDATION: Patient arrived intubated and under hemodynamic control by the Department of Anesthesia personnel. MEDICATIONS: Lidocaine 1% subcutaneous CONTRAST:  15m OMNIPAQUE  IOHEXOL 300 MG/ML  SOLN FLUOROSCOPY TIME:  4.5 minutes, 1403 mGy PROCEDURE: Due to the emergent nature of the situation, usual informed consent was deferred. Due to an indwelling right femoral venous catheter, the left femoral region prepped and draped in usual sterile fashion. Maximal barrier sterile technique was utilized including caps, mask, sterile gowns, sterile gloves, sterile drape, hand hygiene and  skin antiseptic. The left common femoral artery was localized under ultrasound. Patency confirmed and documented. Under real-time ultrasound guidance, the vessel was accessed with a 21-gauge micropuncture needle, exchanged over a 018 guidewire for a transitional dilator, through which a 035 guidewire was advanced. Over this, a 5 [place] vascular sheath was placed, through which a 5 [place] C2 catheter was advanced and used to selectively catheterize the right common iliac artery for selective pelvic arteriography. The C2 catheter was advanced with the aid of the [facility] wire into the distal internal iliac artery. Confirmatory arteriography was performed. Embolization of internal iliac artery branches performed through the C2 catheter using a slurry of Gel-Foam pledgets in dilute contrast. The C2 catheter was then removed, flushed, and reintroduced into the right common iliac artery for confirmatory pelvic arteriography. A Waltman loop was formed with the C2 catheter, and the left common iliac artery was catheterized antegrade for selective pelvic arteriography. The C2 was advanced into the left internal iliac artery. Confirmatory arteriography was performed. Embolization of left internal iliac artery branches performed through the C2 catheter using a slurry of Gel-Foam pledgets and dilute contrast. The C2 catheter was then removed, flushed, and reintroduced into the left common iliac artery for confirmatory pelvic arteriography. The C2 was removed. A final left femoral arteriogram performed to document sheath  placement. The sheath was secured externally with a 0 silk suture and flushed with saline. The patient tolerated the procedure well. COMPLICATIONS: None immediate FINDINGS: Technically successful empiric Gel-Foam embolization of bilateral internal iliac artery territories, without immediate complication. Due to the patient's coagulopathy and hemodynamic instability, the left femoral arterial catheter was secured in place as art line. IMPRESSION: 1. Technically successful bilateral internal iliac artery Gel-Foam embolization. Electronically Signed   By: Lucrezia Europe M.D.   On: 02/09/2016 14:18   Dg Chest Port 1 View  02/10/2016  CLINICAL DATA:  Acute respiratory failure.  Acute chest trauma. EXAM: PORTABLE CHEST 1 VIEW COMPARISON:  02/09/2016 FINDINGS: Endotracheal tube in good position.  NG tube enters the stomach. Right chest tube remains in place. Multiple displaced right rib fractures. Right pleural effusion extending into the apex is slightly improved from yesterday consistent with hemothorax. No pneumothorax. Diffuse bilateral airspace disease similar to yesterday may represent aspiration or pulmonary contusion. Trachea deviated to the left due to superior mediastinal hematoma as noted on CT. Patient rotated to the left. IMPRESSION: Endotracheal tube in good position Right chest tube in place with decrease in right hemothorax. No pneumothorax Diffuse bilateral airspace disease may be due to aspiration pneumonia or contusion Trachea again deviated to the left and unchanged due to superior mediastinal hematoma on the right. Electronically Signed   By: Franchot Gallo M.D.   On: 02/10/2016 08:17   Dg Chest Port 1 View  02/09/2016  CLINICAL DATA:  Hypoxia EXAM: PORTABLE CHEST 1 VIEW COMPARISON:  Chest radiograph and chest CT February 09, 2016 FINDINGS: Endotracheal tube tip is 2.6 cm above the carina. Chest tube is present on the right. Nasogastric tube tip and side port are below the diaphragm. There is a small right  apical pneumothorax. There is extensive soft tissue air on the right. There is airspace consolidation throughout both lungs, more on the right than on the left, slightly progressed. Heart is upper normal in size with pulmonary vascularity within normal limits. No adenopathy evident. There are multiple rib fractures on the right. IMPRESSION: Tube positions as described. Small right apical pneumothorax without tension component. Extensive soft tissue air present  on the right. Extensive opacification in the lungs, progressed. Suspect noncardiogenic pulmonary edema coupled with parenchymal lung contusions and loculated effusion on the right. Early ARDS is also a differential consideration. Electronically Signed   By: Lowella Grip III M.D.   On: 02/09/2016 22:02   Dg Chest Port 1 View  02/09/2016  ADDENDUM REPORT: 02/09/2016 09:34 ADDENDUM: Compared to the chest radiograph at 0823 hours, there are increased densities along the left upper mediastinal region. A mediastinal hematoma cannot be excluded. Electronically Signed   By: Markus Daft M.D.   On: 02/09/2016 09:34  02/09/2016  CLINICAL DATA:  Trauma and right chest injury. Evaluate chest tube insertion. EXAM: PORTABLE CHEST 1 VIEW COMPARISON:  02/09/2016 FINDINGS: Multiple displaced right rib fractures. Insertion of a large bore right chest tube. Side hole appears to be within the right chest cavity. There is increased subcutaneous gas in the right chest. No evidence for a large right pneumothorax. Prominent lung markings throughout the right lung with more confluent densities in the right apex region. Findings are probably associated with contusions. Endotracheal tube is near the carina pointing into the right mainstem bronchus. Nasogastric tube extends into the abdomen. Patient is rotated towards the left. Heart and mediastinum are grossly normal. Left lung is clear except for linear densities at the left lung base which could represent atelectasis. IMPRESSION:  Endotracheal tube near the carina, pointing into the right mainstem bronchus. Placement of right chest tube with increased subcutaneous gas. No large pneumothorax. Patchy densities throughout the right lung, particularly at the apex. Findings most likely represent contusions. The report was called to the Operating Room on 02/09/2016 at 9:24 a.m. Electronically Signed: By: Markus Daft M.D. On: 02/09/2016 09:25   Dg Chest Portable 1 View  02/09/2016  CLINICAL DATA:  [place].  Right chest injury. EXAM: PORTABLE CHEST 1 VIEW COMPARISON:  None. FINDINGS: There are multiple displaced right rib fractures. There is subcutaneous gas in the right chest. No evidence for a large pneumothorax. Patchy densities throughout the right lung, particularly in the right upper lobe lobe. Patient is rotated towards the left. There are densities along the upper mediastinum and cannot exclude a mediastinal hematoma. Heart size is grossly normal. The entire left lung base is not visualized. IMPRESSION: Multiple displaced right rib fractures with subcutaneous gas. Patchy densities throughout the right chest most likely represent contusions. No evidence for a large pneumothorax. Limited evaluation the mediastinum due to patient rotation. Increased densities along the upper mediastinum and concern for mediastinal fluid or hematoma. This report was called to the operating room on 02/09/2016 at 9:30 a.m. Electronically Signed   By: Markus Daft M.D.   On: 02/09/2016 09:32   Dg Ankle Right Port  02/09/2016  CLINICAL DATA:  Swelling at abrasions in the right ankle after jump from balcony today. EXAM: PORTABLE RIGHT ANKLE - 2 VIEW COMPARISON:  None. FINDINGS: Diffuse right ankle soft tissue swelling, most prominent laterally. No fracture, subluxation, focal osseous lesion or appreciable arthropathy. No pathologic soft tissue densities. IMPRESSION: Prominent right ankle soft tissue swelling, with no fracture or malalignment on this two view study.  Electronically Signed   By: Ilona Sorrel M.D.   On: 02/09/2016 15:40   Dg C-arm 1-60 Min  02/09/2016  CLINICAL DATA:  Pelvic trauma, fell out of a window 5 floors up, external fixator placement EXAM: DG C-ARM 61-120 MIN; JUDET PELVIS - 3+ VIEW COMPARISON:  Pelvic radiograph 02/09/2016 FLUOROSCOPY TIME:  1 minutes 21 seconds Images obtained:  8 FINDINGS: Images demonstrate placement of external fixator anchors at BILATERAL iliac bones. Displaced fractures of the RIGHT superior and inferior pubic rami noted. Disruption of pubic symphysis. Hips appear normally located. LEFT superior and inferior pubic rami fractures are less well visualized than on the prior study. RIGHT femoral line noted. Sponges project over pelvis. IMPRESSION: Placement of external fixation anchors at the iliac bones bilaterally. Pelvic fractures are better visualized on the preceding study. Electronically Signed   By: Lavonia Dana M.D.   On: 02/09/2016 13:24   [place] Guide Roadmapping  02/09/2016  CLINICAL DATA:  Comminuted and open pelvic fractures post blunt trauma. underwent pelvic external fixation device placement and intraoperative control of liver laceration. Postop CT demonstrates 2 foci of continued active arterial extravasation proximal to pelvic fractures. Embolization requested. EXAM: IR EMBO ART  VEN HEMORR LYMPH EXTRAV  INC GUIDE ROADMAPPING ANESTHESIA/SEDATION: Patient arrived intubated and under hemodynamic control by the Department of Anesthesia personnel. MEDICATIONS: Lidocaine 1% subcutaneous CONTRAST:  152m OMNIPAQUE IOHEXOL 300 MG/ML  SOLN FLUOROSCOPY TIME:  4.5 minutes, 1403 mGy PROCEDURE: Due to the emergent nature of the situation, usual informed consent was deferred. Due to an indwelling right femoral venous catheter, the left femoral region prepped and draped in usual sterile fashion. Maximal barrier sterile technique was utilized including caps, mask, sterile gowns, sterile gloves,  sterile drape, hand hygiene and skin antiseptic. The left common femoral artery was localized under ultrasound. Patency confirmed and documented. Under real-time ultrasound guidance, the vessel was accessed with a 21-gauge micropuncture needle, exchanged over a 018 guidewire for a transitional dilator, through which a 035 guidewire was advanced. Over this, a 5 [place] sheath was placed, through which a 5 [place] catheter was advanced and used to selectively catheterize the right common iliac artery for selective pelvic arteriography. The C2 catheter was advanced with the aid of the [facility]ad[facility] Hospitalwire into the distal internal iliac artery. Confirmatory arteriography was performed. Embolization of internal iliac artery branches performed through the C2 catheter using a slurry of Gel-Foam pledgets in dilute contrast. The C2 catheter was then removed, flushed, and reintroduced into the right common iliac artery for confirmatory pelvic arteriography. A Waltman loop was formed with the C2 catheter, and the left common iliac artery was catheterized antegrade for selective pelvic arteriography. The C2 was advanced into the left internal iliac artery. Confirmatory arteriography was performed. Embolization of left internal iliac artery branches performed through the C2 catheter using a slurry of Gel-Foam pledgets and dilute contrast. The C2 catheter was then removed, flushed, and reintroduced into the left common iliac artery for confirmatory pelvic arteriography. The C2 was removed. A final left femoral arteriogram performed to document sheath placement. The sheath was secured externally with a 0 silk suture and flushed with saline. The patient tolerated the procedure well. COMPLICATIONS: None immediate FINDINGS: Technically successful empiric Gel-Foam embolization of bilateral internal iliac artery territories, without immediate complication. Due to the patient's coagulopathy and hemodynamic instability, the left femoral  arterial catheter was secured in place as art line. IMPRESSION: 1. Technically successful bilateral internal iliac artery Gel-Foam embolization. Electronically Signed   By: DLucrezia EuropeM.D.   On: 02/09/2016 14:18    ENDOSCOPIC STUDIES: none  IMPRESSION:   *  mutitrauma post suicidal jump from 5th floor.  S/p general and orthopedic surgeries Still hypotensive and on pressors, additional blood products ordered: 2 PRBCs and 2 FFP   *  Drano ingestion.  Difficult to say how much this has impacted her esophagus, given she remains intubated.  No free air on CXR this AM which weighs against esophageal perforation.   *  Anemia, thrombocytopenia, coagulopathy.   *  Rising LFTs not surprising given events, trauma   *  Hyperglycemia, no prior hx of DM but her medical hx needs further investigation.    PLAN:     *  EGD at bedside.    Azucena Freed  02/10/2016, 9:40 AM Pager: [phone]

## 2016-02-10 NOTE — Progress Notes (Addendum)
Patient ID: [name] Bell, female   DOB: 10-15-1981, 35 y.o.   MRN: [id number] Her sister reports that according to information at the scene [name] Bell also drank 2 bottles of liquid acetaminophen, one dose of nyquil, and possibly 6 alprazolam pills.  Will check LFTs and acetaminophen level.  [name] GelinasBurke Amberlea Spagnuolo, MD, MPH, FACS Trauma: [phone] General Surgery: [phone]  02/10/2016 3:32 PM

## 2016-02-10 NOTE — Progress Notes (Signed)
Orthopedic Tech Progress Note Patient Details:  [name] Bell February 01, 1981 [id number]  Ortho Devices Type of Ortho Device: Wrist splint Ortho Device/Splint Interventions: Application   Saul FordyceJennifer C Krystie Leiter 02/10/2016, 1:49 PM

## 2016-02-10 NOTE — Consult Note (Signed)
[facility] Consult: 9:40 AM 02/10/2016  LOS: 1 day    Referring Provider: Dr Hulen Skains Primary Care Physician:  No primary care provider on file. Primary Gastroenterologist:  Althia Forts.      Reason for Consultation:  Ingestion of Drano.    HPI: [name] Bell is a 35 y.o. female.  Her address is in [place] but she moved to [place] 3 weeks ago to be with family.  She is obese.  PMH unknown.    Pt attempted suicide: taped DNR note to chest, drank Drano, then jumped off 5 story building ([address] [place]).   S/p 02/09/16 gel foam embolization of bil internal iliac arteries.  S/p 02/09/16 ex lap with cholecystectomy, resection SB and mesenteric repair, liver lac repair, application or wound vac.   S/p 02/09/16 closed reduction of pelvic ring, external pelvic fixation.   Also has right metacarpal fracture.  Hgb went from 10 to 3.6, not 8.2 post multiple blood products.  Platelet nadir 59, improved post product.  Protime/INR 13.1/2.0.  Transfused PRBCs x 5; FFP x 4; platelets x 2 ior 3, cryoprecipitate x 1.  Glucose in 300s.  ETOH level 6.  Remains intubated but is alert and able to communicate with nods.  Denies throat and chest pain. Note hypotension this AM. More blood products ordered, on pressors.  CXR today with no free or subcutaneous air.      History reviewed. No pertinent past medical history.  History reviewed. No pertinent past surgical history.  Prior to Admission medications   Not on File    Scheduled Meds: . sodium chloride   Intravenous Once  . sodium chloride   Intravenous Once  . sodium chloride   Intravenous Once  . sodium chloride   Intravenous Once  . sodium chloride   Intravenous Once  . sodium chloride   Intravenous Once  . antiseptic oral rinse  7 mL Mouth Rinse 10 times per  day  . chlorhexidine gluconate (SAGE KIT)  15 mL Mouth Rinse BID  . fentaNYL (SUBLIMAZE) injection  50 mcg Intravenous Once  . ipratropium-albuterol  3 mL Nebulization Q6H  . norepinephrine (LEVOPHED) Adult infusion  0-40 mcg/min Intravenous To OR  . pantoprazole  40 mg Oral Daily   Or  . pantoprazole (PROTONIX) IV  40 mg Intravenous Daily  . tranexamic acid (CYKLOKAPRON) infusion (TRAUMA)  1,000 mg Intravenous Once   Infusions: . sodium chloride 150 mL/hr at 02/10/16 0800  . fentaNYL infusion INTRAVENOUS Stopped (02/10/16 0645)  . norepinephrine (LEVOPHED) Adult infusion 40 mcg/min (02/10/16 0800)  . propofol (DIPRIVAN) infusion Stopped (02/09/16 1753)  . vasopressin (PITRESSIN) infusion - *FOR SHOCK* 0.03 Units/min (02/10/16 0800)   PRN Meds: fentaNYL, ondansetron **OR** ondansetron (ZOFRAN) IV   Allergies as of 02/09/2016  . (Not on File)    No family history on file.  Social History   Social History  . Marital Status: Unknown    Spouse Name: N/A  . Number of Children: N/A  . Years of Education: N/A   Occupational History  . Not on  file.   Social History Main Topics  . Smoking status: Unknown If Ever Smoked  . Smokeless tobacco: Not on file  . Alcohol Use: Not on file     Comment: unknown  . Drug Use: Not on file  . Sexual Activity: Not on file   Other Topics Concern  . Not on file   Social History Narrative  . No narrative on file    REVIEW OF SYSTEMS: Unable to complete much of thi.  Pt denies hx of bleeding, egd/colonoscopy, diabetes or elevated glucose  PHYSICAL EXAM: Vital signs in last 24 hours: Filed Vitals:   02/10/16 0830 02/10/16 0832  BP: 81/56 86/45  Pulse: 114 115  Temp: 99.7 F (37.6 C)   Resp: 31 32   Wt Readings from Last 3 Encounters:  February 12, 2016 127.9 kg (281 lb 15.5 oz)    General: obese AAF with swollen face, intubated and hooked up to multiple pumps Head:  + trauma and swelling on head.    Eyes:  Swollen eyelids Ears:  Not  HOH  Nose:  No blood per nares Mouth:  intubated Neck:  cspine collar in place Lungs:  Clear in front.  Intubated on vent but no labored breathing.  Right chest tube in place Heart: RRR Abdomen: quiet, wound vac in place.  Pelvic stablizer halo in place.  Did not apply pressure or assess for pain.  Rectal: not performed   Musc/Skeltl: pelvic fixator in place. Bandages on wrist and lower extremities Extremities:  + edema  Neurologic:  Follows commands. Limb function and strength not tested.   Intake/Output from previous day: 02/12/23 0701 - 04/04 0700 In: 21992 [I.V.:16901; QIHKV:4259; IV Piggyback:2120] Out: 38 [Urine:2090; Emesis/NG output:50; Drains:2900; Blood:200; Chest Tube:670] Intake/Output this shift: Total I/O In: 913.8 [I.V.:913.8] Out: -   LAB RESULTS:  Recent Labs  2016/02/12 2116 02/10/16 0100 02/10/16 0405  WBC 3.1* 4.2 6.0  HGB 3.8* 6.1* 8.2*  HCT 11.6* 17.9* 24.0*  PLT 78* 145* 129*   BMET Lab Results  Component Value Date   NA 141 02/10/2016   NA 146* 2016/02/12   NA 144 02/12/2016   K 4.2 02/10/2016   K 4.2 02-12-16   K 6.3* 02-12-2016   CL 113* 02/10/2016   CL 116* Feb 12, 2016   CL 107 02-12-2016   CO2 15* 02/10/2016   CO2 18* February 12, 2016   CO2 10* 12-Feb-2016   GLUCOSE 86 02/10/2016   GLUCOSE 248* Feb 12, 2016   GLUCOSE 368* 02-12-2016   BUN 8 02/10/2016   BUN 6 2016-02-12   BUN 4* 02-12-16   CREATININE 1.22* 02/10/2016   CREATININE 0.98 02-12-16   CREATININE 1.20* 02-12-2016   CALCIUM 6.0* 02/10/2016   CALCIUM 6.6* Feb 12, 2016   CALCIUM 8.3* 02/12/16   LFT  Recent Labs  02-12-16 0829 02-12-16 1555 02/10/16 0405  PROT 5.9* 3.8* 4.2*  ALBUMIN 3.2* 2.1* 2.4*  AST 595* 208* 467*  ALT 456* 144* 266*  ALKPHOS 43 35* 43  BILITOT 0.7 0.6 1.2   PT/INR Lab Results  Component Value Date   INR 1.63* 02/10/2016   INR 1.55* 02/10/2016   INR 1.83* 02-12-16   Hepatitis Panel No results for input(s): HEPBSAG, HCVAB, HEPAIGM,  HEPBIGM in the last 72 hours. C-Diff No components found for: CDIFF Lipase  No results found for: LIPASE  Drugs of Abuse  No results found for: LABOPIA, COCAINSCRNUR, LABBENZ, AMPHETMU, THCU, LABBARB   RADIOLOGY STUDIES: Dg Wrist Complete Left  2016/02/12  CLINICAL DATA:  Fall. EXAM: LEFT  WRIST - COMPLETE 3+ VIEW COMPARISON:  None. FINDINGS: Exam detail is diminished due to overlying support material. There is a fracture deformity which extends through the base second metacarpal bone. IMPRESSION: 1. There is a fracture involving the base of the second metacarpal bone. Recommend repeat imaging following removal overlying tubes and lines. Electronically Signed   By: Kerby Moors M.D.   On: 02/09/2016 18:08   Ct Chest W Contrast Ct Abdomen Pelvis W Contrast  02/09/2016  CLINICAL DATA:  Level 1 trauma. [place] off a fifth floor balcony. Hypotension and abdominal pain. Intubated in the emergency department and taken to the operating room for exploratory laparotomy. Initial encounter. EXAM: CT CHEST, ABDOMEN, AND PELVIS WITH CONTRAST TECHNIQUE: Multidetector CT imaging of the chest, abdomen and pelvis was performed following the standard protocol during bolus administration of intravenous contrast. CONTRAST:  100 mL Isovue-300 COMPARISON:  Chest and pelvis radiographs earlier today FINDINGS: CT CHEST An endotracheal tube is in place and terminates 11.5 cm above the carina. An enteric tube terminates in the stomach. A right jugular catheter is partially visualized terminating near the confluence of the brachiocephalic veins. Hematoma is partially visualized in the right lower neck adjacent to the right thyroid lobe and anterior to the right common carotid artery. There is also hematoma in the right supraclavicular region and right chest wall. There are fractures of the right second through twelfth ribs, many of which are fractured in a segmental fashion. The most displaced right-sided rib fracture involves the  posterior right eleventh rib with 1.6 cm of displacement. There fractures of the left first and second ribs. There are small bilateral pneumothoraces, right larger than left. A chest tube terminates in the right upper hemi thorax and may be within the major fissure. Extensive bilateral lung opacities likely reflect a combination of collapse and contusions, greatest in the lower lobes and right upper lobe. There is a small right hemothorax, and there is also trace left pleural fluid/blood. There is diffuse haziness/hematoma in the mediastinum without definite evidence of thoracic aortic injury. CT ABDOMEN AND PELVIS There is a large liver laceration which involves the right greater than left lobes and extends through the hilum. There is attenuation of intrahepatic portal venous branches. No definite active extravasation is identified in/adjacent to the liver. The spleen is suboptimally evaluated due to motion and streak artifact. There is a small amount of perisplenic free fluid. There is hyperenhancement of both adrenal glands. A laceration in the medial upper pole of the right kidney measures approximately 4.0 x 3.1 cm without definite active extravasation in this area. There is a small right perinephric hematoma which partially surrounds the main renal artery and vein which are grossly patent. No acute left renal injury is identified. There is retroperitoneal/peripancreatic fluid with poor delineation of the pancreatic head. This also obscures portions of the duodenum. Sequelae of recent laparotomy are identified with a large midline abdominal wound. There is mucosal hyperenhancement involving multiple bowel loops. Sequelae of small bowel resection are identified with an anastomosis in the midline just deep to the open and wound. Moderate volume intraperitoneal free fluid is present. There is intraperitoneal free air related to recent surgery. Sponges are present in the right upper quadrant along the anterior  inferior surface of the liver as well as in the left lower quadrant. A Foley catheter is in place and the bladder is decompressed. The abdominal aorta is patent, however there is evidence of focal injury at the celiac origin with a 10  x 3 x 15 mm outpouching at the origin and short segment occlusion of the proximal celiac artery over a length of 8 mm. The remainder of the abdominal aorta appears intact. The SMA, renal arteries, IMA, and iliac arteries are grossly patent. There are transverse process fractures on the right at L1, bilaterally at L2, bilaterally at L3, on the left at L4, and on the left at L5. There are comminuted bilateral sacral fractures. Bilateral external fixation devices have been placed into the iliac bones. There are bilateral superior and inferior pubic rami fractures. There is extraluminal contrast posterior to the left inferior ramus fracture consistent with active extravasation. There is also a 2.5 cm focus of extravasation posterior to the right hip with large amount of surrounding hematoma in the gluteal region extending into the upper thigh. There is diastasis of the pubic symphysis. A 3.8 cm lucent lesion with well-defined sclerotic margin is present in the right femoral neck, nonspecific but without aggressive features. Hematoma is partially visualized in the subcutaneous tissues of the right flank extending into the lateral right upper thigh. IMPRESSION: 1. 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 2. Multiple pelvic fractures with active extravasation adjacent to the left inferior pubic ramus fracture and posterior to the right hip. 3. Bilateral hemopneumothoraces, right larger than left. Right chest tube in place. 4. Bilateral pulmonary contusions and collapse. 5. Right second through twelfth and left first and second rib fractures. Multiple right-sided ribs are displaced and fractured segmentally. 6. Large liver  laceration. 7. Right renal laceration. 8. Retroperitoneal hematoma which could reflect pancreatic or duodenal injury. 9. Sequelae of exploratory laparotomy with moderate volume intraperitoneal fluid/hematoma. Right upper quadrant and left lower quadrant sponges. 10. Evidence of hypovolemia including shock bowel. 11. Multiple lumbar spine transverse process fractures. Critical Value/emergent results were called by telephone at the time of interpretation on 02/09/2016 at 2:05 pm to Silvestre Gunner, PA, who verbally acknowledged these results. Electronically Signed   By: Logan Bores M.D.   On: 02/09/2016 14:23   Ir Angiogram Pelvis Selective Or Supraselective  02/09/2016  CLINICAL DATA:  Comminuted and open pelvic fractures post blunt trauma. underwent pelvic external fixation device placement and intraoperative control of liver laceration. Postop CT demonstrates 2 foci of continued active arterial extravasation proximal to pelvic fractures. Embolization requested. EXAM: IR EMBO ART  VEN HEMORR LYMPH EXTRAV  INC GUIDE ROADMAPPING ANESTHESIA/SEDATION: Patient arrived intubated and under hemodynamic control by the Department of Anesthesia personnel. MEDICATIONS: Lidocaine 1% subcutaneous CONTRAST:  129m OMNIPAQUE IOHEXOL 300 MG/ML  SOLN FLUOROSCOPY TIME:  4.5 minutes, 1403 mGy PROCEDURE: Due to the emergent nature of the situation, usual informed consent was deferred. Due to an indwelling right femoral venous catheter, the left femoral region prepped and draped in usual sterile fashion. Maximal barrier sterile technique was utilized including caps, mask, sterile gowns, sterile gloves, sterile drape, hand hygiene and skin antiseptic. The left common femoral artery was localized under ultrasound. Patency confirmed and documented. Under real-time ultrasound guidance, the vessel was accessed with a 21-gauge micropuncture needle, exchanged over a 018 guidewire for a transitional dilator, through which a 035 guidewire was  advanced. Over this, a 5 [place] sheath was placed, through which a 5 [place] catheter was advanced and used to selectively catheterize the right common iliac artery for selective pelvic arteriography. The C2 catheter was advanced with the aid of the [facility] into the distal internal iliac artery. Confirmatory  arteriography was performed. Embolization of internal iliac artery branches performed through the C2 catheter using a slurry of Gel-Foam pledgets in dilute contrast. The C2 catheter was then removed, flushed, and reintroduced into the right common iliac artery for confirmatory pelvic arteriography. A Waltman loop was formed with the C2 catheter, and the left common iliac artery was catheterized antegrade for selective pelvic arteriography. The C2 was advanced into the left internal iliac artery. Confirmatory arteriography was performed. Embolization of left internal iliac artery branches performed through the C2 catheter using a slurry of Gel-Foam pledgets and dilute contrast. The C2 catheter was then removed, flushed, and reintroduced into the left common iliac artery for confirmatory pelvic arteriography. The C2 was removed. A final left femoral arteriogram performed to document sheath placement. The sheath was secured externally with a 0 silk suture and flushed with saline. The patient tolerated the procedure well. COMPLICATIONS: None immediate FINDINGS: Technically successful empiric Gel-Foam embolization of bilateral internal iliac artery territories, without immediate complication. Due to the patient's coagulopathy and hemodynamic instability, the left femoral arterial catheter was secured in place as art line. IMPRESSION: 1. Technically successful bilateral internal iliac artery Gel-Foam embolization. Electronically Signed   By: Lucrezia Europe M.D.   On: 02/09/2016 14:18   Dg Pelvis Portable  02/09/2016  CLINICAL DATA:  [place] from several feet.  Right pelvis injury. EXAM: PORTABLE PELVIS 1-2  VIEWS COMPARISON:  None. FINDINGS: Fractures are noted through the right superior and inferior pubic rami. Possible fracture through the left inferior pubic ramus. There is widening of the pubic symphysis. SI joints appear symmetric and unremarkable. IMPRESSION: Right superior and inferior pubic rami fractures. Possible fracture through the left inferior pubic ramus with diastases of the pubic symphysis. Electronically Signed   By: Rolm Baptise M.D.   On: 02/09/2016 09:30   Dg Pelvis 3v Judet  02/09/2016  CLINICAL DATA:  Pelvic trauma, fell out of a window 5 floors up, external fixator placement EXAM: DG C-ARM 61-120 MIN; JUDET PELVIS - 3+ VIEW COMPARISON:  Pelvic radiograph 02/09/2016 FLUOROSCOPY TIME:  1 minutes 21 seconds Images obtained:  8 FINDINGS: Images demonstrate placement of external fixator anchors at BILATERAL iliac bones. Displaced fractures of the RIGHT superior and inferior pubic rami noted. Disruption of pubic symphysis. Hips appear normally located. LEFT superior and inferior pubic rami fractures are less well visualized than on the prior study. RIGHT femoral line noted. Sponges project over pelvis. IMPRESSION: Placement of external fixation anchors at the iliac bones bilaterally. Pelvic fractures are better visualized on the preceding study. Electronically Signed   By: Lavonia Dana M.D.   On: 02/09/2016 13:24   Ir US Guide Vasc Access Left  02/09/2016  CLINICAL DATA:  Comminuted and open pelvic fractures post blunt trauma. underwent pelvic external fixation device placement and intraoperative control of liver laceration. Postop CT demonstrates 2 foci of continued active arterial extravasation proximal to pelvic fractures. Embolization requested. EXAM: IR EMBO ART  VEN HEMORR LYMPH EXTRAV  INC GUIDE ROADMAPPING ANESTHESIA/SEDATION: Patient arrived intubated and under hemodynamic control by the Department of Anesthesia personnel. MEDICATIONS: Lidocaine 1% subcutaneous CONTRAST:  154m OMNIPAQUE  IOHEXOL 300 MG/ML  SOLN FLUOROSCOPY TIME:  4.5 minutes, 1403 mGy PROCEDURE: Due to the emergent nature of the situation, usual informed consent was deferred. Due to an indwelling right femoral venous catheter, the left femoral region prepped and draped in usual sterile fashion. Maximal barrier sterile technique was utilized including caps, mask, sterile gowns, sterile gloves, sterile drape, hand hygiene and  skin antiseptic. The left common femoral artery was localized under ultrasound. Patency confirmed and documented. Under real-time ultrasound guidance, the vessel was accessed with a 21-gauge micropuncture needle, exchanged over a 018 guidewire for a transitional dilator, through which a 035 guidewire was advanced. Over this, a 5 [place] vascular sheath was placed, through which a 5 [place] C2 catheter was advanced and used to selectively catheterize the right common iliac artery for selective pelvic arteriography. The C2 catheter was advanced with the aid of the [facility] wire into the distal internal iliac artery. Confirmatory arteriography was performed. Embolization of internal iliac artery branches performed through the C2 catheter using a slurry of Gel-Foam pledgets in dilute contrast. The C2 catheter was then removed, flushed, and reintroduced into the right common iliac artery for confirmatory pelvic arteriography. A Waltman loop was formed with the C2 catheter, and the left common iliac artery was catheterized antegrade for selective pelvic arteriography. The C2 was advanced into the left internal iliac artery. Confirmatory arteriography was performed. Embolization of left internal iliac artery branches performed through the C2 catheter using a slurry of Gel-Foam pledgets and dilute contrast. The C2 catheter was then removed, flushed, and reintroduced into the left common iliac artery for confirmatory pelvic arteriography. The C2 was removed. A final left femoral arteriogram performed to document sheath  placement. The sheath was secured externally with a 0 silk suture and flushed with saline. The patient tolerated the procedure well. COMPLICATIONS: None immediate FINDINGS: Technically successful empiric Gel-Foam embolization of bilateral internal iliac artery territories, without immediate complication. Due to the patient's coagulopathy and hemodynamic instability, the left femoral arterial catheter was secured in place as art line. IMPRESSION: 1. Technically successful bilateral internal iliac artery Gel-Foam embolization. Electronically Signed   By: Lucrezia Europe M.D.   On: 02/09/2016 14:18   Dg Chest Port 1 View  02/10/2016  CLINICAL DATA:  Acute respiratory failure.  Acute chest trauma. EXAM: PORTABLE CHEST 1 VIEW COMPARISON:  02/09/2016 FINDINGS: Endotracheal tube in good position.  NG tube enters the stomach. Right chest tube remains in place. Multiple displaced right rib fractures. Right pleural effusion extending into the apex is slightly improved from yesterday consistent with hemothorax. No pneumothorax. Diffuse bilateral airspace disease similar to yesterday may represent aspiration or pulmonary contusion. Trachea deviated to the left due to superior mediastinal hematoma as noted on CT. Patient rotated to the left. IMPRESSION: Endotracheal tube in good position Right chest tube in place with decrease in right hemothorax. No pneumothorax Diffuse bilateral airspace disease may be due to aspiration pneumonia or contusion Trachea again deviated to the left and unchanged due to superior mediastinal hematoma on the right. Electronically Signed   By: Franchot Gallo M.D.   On: 02/10/2016 08:17   Dg Chest Port 1 View  02/09/2016  CLINICAL DATA:  Hypoxia EXAM: PORTABLE CHEST 1 VIEW COMPARISON:  Chest radiograph and chest CT February 09, 2016 FINDINGS: Endotracheal tube tip is 2.6 cm above the carina. Chest tube is present on the right. Nasogastric tube tip and side port are below the diaphragm. There is a small right  apical pneumothorax. There is extensive soft tissue air on the right. There is airspace consolidation throughout both lungs, more on the right than on the left, slightly progressed. Heart is upper normal in size with pulmonary vascularity within normal limits. No adenopathy evident. There are multiple rib fractures on the right. IMPRESSION: Tube positions as described. Small right apical pneumothorax without tension component. Extensive soft tissue air present  on the right. Extensive opacification in the lungs, progressed. Suspect noncardiogenic pulmonary edema coupled with parenchymal lung contusions and loculated effusion on the right. Early ARDS is also a differential consideration. Electronically Signed   By: Lowella Grip III M.D.   On: 02/09/2016 22:02   Dg Chest Port 1 View  02/09/2016  ADDENDUM REPORT: 02/09/2016 09:34 ADDENDUM: Compared to the chest radiograph at 0823 hours, there are increased densities along the left upper mediastinal region. A mediastinal hematoma cannot be excluded. Electronically Signed   By: Markus Daft M.D.   On: 02/09/2016 09:34  02/09/2016  CLINICAL DATA:  Trauma and right chest injury. Evaluate chest tube insertion. EXAM: PORTABLE CHEST 1 VIEW COMPARISON:  02/09/2016 FINDINGS: Multiple displaced right rib fractures. Insertion of a large bore right chest tube. Side hole appears to be within the right chest cavity. There is increased subcutaneous gas in the right chest. No evidence for a large right pneumothorax. Prominent lung markings throughout the right lung with more confluent densities in the right apex region. Findings are probably associated with contusions. Endotracheal tube is near the carina pointing into the right mainstem bronchus. Nasogastric tube extends into the abdomen. Patient is rotated towards the left. Heart and mediastinum are grossly normal. Left lung is clear except for linear densities at the left lung base which could represent atelectasis. IMPRESSION:  Endotracheal tube near the carina, pointing into the right mainstem bronchus. Placement of right chest tube with increased subcutaneous gas. No large pneumothorax. Patchy densities throughout the right lung, particularly at the apex. Findings most likely represent contusions. The report was called to the Operating Room on 02/09/2016 at 9:24 a.m. Electronically Signed: By: Markus Daft M.D. On: 02/09/2016 09:25   Dg Chest Portable 1 View  02/09/2016  CLINICAL DATA:  [place].  Right chest injury. EXAM: PORTABLE CHEST 1 VIEW COMPARISON:  None. FINDINGS: There are multiple displaced right rib fractures. There is subcutaneous gas in the right chest. No evidence for a large pneumothorax. Patchy densities throughout the right lung, particularly in the right upper lobe lobe. Patient is rotated towards the left. There are densities along the upper mediastinum and cannot exclude a mediastinal hematoma. Heart size is grossly normal. The entire left lung base is not visualized. IMPRESSION: Multiple displaced right rib fractures with subcutaneous gas. Patchy densities throughout the right chest most likely represent contusions. No evidence for a large pneumothorax. Limited evaluation the mediastinum due to patient rotation. Increased densities along the upper mediastinum and concern for mediastinal fluid or hematoma. This report was called to the operating room on 02/09/2016 at 9:30 a.m. Electronically Signed   By: Markus Daft M.D.   On: 02/09/2016 09:32   Dg Ankle Right Port  02/09/2016  CLINICAL DATA:  Swelling at abrasions in the right ankle after jump from balcony today. EXAM: PORTABLE RIGHT ANKLE - 2 VIEW COMPARISON:  None. FINDINGS: Diffuse right ankle soft tissue swelling, most prominent laterally. No fracture, subluxation, focal osseous lesion or appreciable arthropathy. No pathologic soft tissue densities. IMPRESSION: Prominent right ankle soft tissue swelling, with no fracture or malalignment on this two view study.  Electronically Signed   By: Ilona Sorrel M.D.   On: 02/09/2016 15:40   Dg C-arm 1-60 Min  02/09/2016  CLINICAL DATA:  Pelvic trauma, fell out of a window 5 floors up, external fixator placement EXAM: DG C-ARM 61-120 MIN; JUDET PELVIS - 3+ VIEW COMPARISON:  Pelvic radiograph 02/09/2016 FLUOROSCOPY TIME:  1 minutes 21 seconds Images obtained:  8 FINDINGS: Images demonstrate placement of external fixator anchors at BILATERAL iliac bones. Displaced fractures of the RIGHT superior and inferior pubic rami noted. Disruption of pubic symphysis. Hips appear normally located. LEFT superior and inferior pubic rami fractures are less well visualized than on the prior study. RIGHT femoral line noted. Sponges project over pelvis. IMPRESSION: Placement of external fixation anchors at the iliac bones bilaterally. Pelvic fractures are better visualized on the preceding study. Electronically Signed   By: Lavonia Dana M.D.   On: 02/09/2016 13:24   [place] Guide Roadmapping  02/09/2016  CLINICAL DATA:  Comminuted and open pelvic fractures post blunt trauma. underwent pelvic external fixation device placement and intraoperative control of liver laceration. Postop CT demonstrates 2 foci of continued active arterial extravasation proximal to pelvic fractures. Embolization requested. EXAM: IR EMBO ART  VEN HEMORR LYMPH EXTRAV  INC GUIDE ROADMAPPING ANESTHESIA/SEDATION: Patient arrived intubated and under hemodynamic control by the Department of Anesthesia personnel. MEDICATIONS: Lidocaine 1% subcutaneous CONTRAST:  130m OMNIPAQUE IOHEXOL 300 MG/ML  SOLN FLUOROSCOPY TIME:  4.5 minutes, 1403 mGy PROCEDURE: Due to the emergent nature of the situation, usual informed consent was deferred. Due to an indwelling right femoral venous catheter, the left femoral region prepped and draped in usual sterile fashion. Maximal barrier sterile technique was utilized including caps, mask, sterile gowns, sterile gloves,  sterile drape, hand hygiene and skin antiseptic. The left common femoral artery was localized under ultrasound. Patency confirmed and documented. Under real-time ultrasound guidance, the vessel was accessed with a 21-gauge micropuncture needle, exchanged over a 018 guidewire for a transitional dilator, through which a 035 guidewire was advanced. Over this, a 5 [place] sheath was placed, through which a 5 [place] catheter was advanced and used to selectively catheterize the right common iliac artery for selective pelvic arteriography. The C2 catheter was advanced with the aid of the BMid [facility] Center LLCwire into the distal internal iliac artery. Confirmatory arteriography was performed. Embolization of internal iliac artery branches performed through the C2 catheter using a slurry of Gel-Foam pledgets in dilute contrast. The C2 catheter was then removed, flushed, and reintroduced into the right common iliac artery for confirmatory pelvic arteriography. A Waltman loop was formed with the C2 catheter, and the left common iliac artery was catheterized antegrade for selective pelvic arteriography. The C2 was advanced into the left internal iliac artery. Confirmatory arteriography was performed. Embolization of left internal iliac artery branches performed through the C2 catheter using a slurry of Gel-Foam pledgets and dilute contrast. The C2 catheter was then removed, flushed, and reintroduced into the left common iliac artery for confirmatory pelvic arteriography. The C2 was removed. A final left femoral arteriogram performed to document sheath placement. The sheath was secured externally with a 0 silk suture and flushed with saline. The patient tolerated the procedure well. COMPLICATIONS: None immediate FINDINGS: Technically successful empiric Gel-Foam embolization of bilateral internal iliac artery territories, without immediate complication. Due to the patient's coagulopathy and hemodynamic instability, the left femoral  arterial catheter was secured in place as art line. IMPRESSION: 1. Technically successful bilateral internal iliac artery Gel-Foam embolization. Electronically Signed   By: DLucrezia EuropeM.D.   On: 02/09/2016 14:18    ENDOSCOPIC STUDIES: none  IMPRESSION:   *  mutitrauma post suicidal jump from 5th floor.  S/p general and orthopedic surgeries Still hypotensive and on pressors, additional blood products ordered: 2 PRBCs and 2 FFP   *  Drano ingestion.  Difficult to say how much this has impacted her esophagus, given she remains intubated.  No free air on CXR this AM which weighs against esophageal perforation.   *  Anemia, thrombocytopenia, coagulopathy.   *  Rising LFTs not surprising given events, trauma   *  Hyperglycemia, no prior hx of DM but her medical hx needs further investigation.    PLAN:     *  EGD at bedside.    Azucena Freed  02/10/2016, 9:40 AM Pager: [phone]

## 2016-02-10 NOTE — Progress Notes (Signed)
Patient ID: Phylliss BobKatia D Usery, female   DOB: 1981-04-09, 35 y.o.   MRN: [id number] For emergent ex lap and possible bowel resection as above. I discussed the procedure, risks, and benefits with her sister who agrees. [name] Aizah Gehlhausen, MD, MPH, FACS Trauma: [phone] General Surgery: [phone]

## 2016-02-10 NOTE — Interval H&P Note (Signed)
History and Physical Interval Note:  02/10/2016 3:41 PM  [name] Bell  has presented today for surgery, with the diagnosis of drank drano.  assess esophagus for injury.  The various methods of treatment have been discussed with the patient and family. After consideration of risks, benefits and other options for treatment, the patient has consented to  Procedure(s): ESOPHAGOGASTRODUODENOSCOPY (EGD) (N/A) as a surgical intervention .  The patient's history has been reviewed, patient examined, no change in status, stable for surgery.  I have reviewed the patient's chart and labs.  Questions were answered to the patient's satisfaction.     Charlie PitterHenry L Danis III

## 2016-02-10 NOTE — Care Management Note (Signed)
Case Management Note  Patient Details  Name: [name] Bell MRN: [id number] Date of Birth: 23-Sep-1981  Subjective/Objective:    Pt admitted on 02/09/16 s/p suicide attempt by jumping from the 5th floor of a hotel and ingesting partial bottle of drain cleaner.  Pt is critically ill, with hypovolemic shock and respiratory failure, in addition to pelvic fracture and internal injuries.  PTA, pt independent, and resided in the [place], [place] area.  Her sister, Ulis Rias, is at bedside.                Action/Plan: Met with sister, Ulis Rias, to offer emotional support.  Ulis Rias thinks sister may have insurance; she states she will check her wallet for a card.  Will continue to follow progress/offer support as needed.    Expected Discharge Date:                  Expected Discharge Plan:     In-House Referral:  Clinical Social Work  Discharge planning Services  CM Consult  Post Acute Care Choice:    Choice offered to:     DME Arranged:    DME Agency:     HH Arranged:    HH Agency:     Status of Service:  In process, will continue to follow  Medicare Important Message Given:    Date Medicare IM Given:    Medicare IM give by:    Date Additional Medicare IM Given:    Additional Medicare Important Message give by:     If discussed at [place] of Stay Meetings, dates discussed:    Additional Comments:  Reinaldo Raddle, RN, BSN  Trauma/Neuro ICU Case Manager [phone]

## 2016-02-10 NOTE — Transfer of Care (Signed)
Immediate Anesthesia Transfer of Care Note  Patient: [name] Bell  Procedure(s) Performed: Procedure(s): EXPLORATORY LAPAROTOMY, removal of packs,  cauterization of liver, repacking of liver, and open abdomen vac application (N/A)  Patient Location: ICU  Anesthesia Type:General  Level of Consciousness: sedated and Patient remains intubated per anesthesia plan  Airway & Oxygen Therapy: Patient remains intubated per anesthesia plan and Patient placed on Ventilator (see vital sign flow sheet for setting)  Post-op Assessment: Report given to RN and Post -op Vital signs reviewed and stable  Post vital signs: Reviewed and stable  Last Vitals:  Filed Vitals:   02/10/16 1730 02/10/16 1745  BP: 83/66 108/52  Pulse: 122 121  Temp:    Resp: 34 35    Complications: No apparent anesthesia complications

## 2016-02-10 NOTE — Progress Notes (Addendum)
Patient ID: [name] Bell, female   DOB: 11/04/1982, 35 y.o.   MRN: [id number] Follow up - Trauma Critical Care  Patient Details:    [name] Bell is an 35 y.o. female.  Lines/tubes : Airway 7.5 mm (Active)  Secured at (cm) 24 cm 02/10/2016  8:32 AM  Measured From Lips 02/10/2016  8:32 AM  Secured Location Center 02/10/2016  8:32 AM  Secured By [employer] 02/10/2016  8:32 AM  Tube Holder Repositioned Yes 02/10/2016  8:32 AM  Cuff Pressure (cm H2O) 22 cm H2O 02/10/2016  8:32 AM  Site Condition Dry 02/10/2016  8:32 AM     CVC Triple Lumen 02/09/16 Right Femoral (Active)  Indication for Insertion or Continuance of Line Vasoactive infusions 02/10/2016  8:00 AM  Site Assessment Clean;Intact;Dry 02/09/2016  8:00 PM  Proximal Lumen Status Infusing 02/09/2016  8:00 PM  Medial Infusing 02/09/2016  8:00 PM  Distal Lumen Status Other (Comment) 02/09/2016  8:00 PM  Dressing Type Transparent;Occlusive;Gauze 02/09/2016  8:00 PM  Dressing Status Clean;Dry;Intact 02/09/2016  8:00 PM  Line Care Tubing changed 02/10/2016  5:00 AM  Dressing Intervention Other (Comment) 02/09/2016  8:00 PM  Dressing Change Due 02/16/16 02/09/2016  8:00 PM     Arterial Line 02/09/16 Right Radial (Active)  Site Assessment Clean;Dry;Intact 02/09/2016  8:00 PM  Line Status Pulsatile blood flow 02/09/2016  8:00 PM  Art Line Waveform Appropriate 02/09/2016  8:00 PM  Art Line Interventions Zeroed and calibrated;Leveled;Connections checked and tightened;Flushed per protocol;Line pulled back 02/09/2016  8:00 PM  Color/Movement/Sensation Capillary refill less than 3 sec 02/09/2016  8:00 PM  Dressing Type Transparent 02/09/2016  8:00 PM  Dressing Status Clean;Intact;Dry 02/09/2016  8:00 PM     Chest Tube 1 Right Pleural 32 Fr. (Active)  Suction -20 cm H2O 02/10/2016  6:00 AM  Chest Tube Air Leak None 02/10/2016  6:00 AM  Patency Intervention Milked;Tip/tilt 02/10/2016  4:00 AM  Drainage Description Bright red 02/10/2016  6:00 AM  Dressing Status Clean;Dry;Intact  02/10/2016  6:00 AM  Dressing Intervention Dressing reinforced 02/10/2016  6:00 AM  Site Assessment Other (Comment) 02/10/2016  6:00 AM  Surrounding Skin Unable to view 02/10/2016  6:00 AM  Output (mL) 50 mL 02/10/2016  5:00 AM     Negative Pressure Wound Therapy Abdomen Mid (Active)  Site / Wound Assessment Bleeding;Clean 02/10/2016  6:00 AM  Peri-wound Assessment Intact 02/10/2016  6:00 AM  Cycle Continuous 02/09/2016  3:00 PM  Target Pressure (mmHg) 125 02/10/2016  6:00 AM  Canister Changed Yes 02/10/2016  6:00 AM  Dressing Status Intact 02/10/2016  6:00 AM  Drainage Amount Copious 02/10/2016  6:00 AM  Drainage Description Sanguineous 02/10/2016  6:00 AM  Output (mL) 500 mL 02/10/2016  3:00 AM     NG/OG Tube Orogastric (Active)  Placement Verification Xray;Auscultation 02/09/2016  8:00 PM  Site Assessment Clean;Dry;Intact 02/09/2016  8:00 PM  Status Suction-low intermittent 02/09/2016  8:00 PM  Drainage Appearance Bile;Yellow 02/10/2016 12:00 AM  Output (mL) 50 mL 02/09/2016  7:00 PM     Urethral Catheter S. Bowman,RN Latex 16 Fr. (Active)  Indication for Insertion or Continuance of Catheter Unstable critical patients (first 24-48 hours) 02/10/2016  8:00 AM  Site Assessment Clean;Intact 02/09/2016  8:00 PM  Catheter Maintenance Bag below level of bladder;Catheter secured;Drainage bag/tubing not touching floor;Insertion date on drainage bag;No dependent loops;Seal intact 02/10/2016  8:00 AM  Collection Container Standard drainage bag 02/09/2016  8:00 PM  Securement Method Securing device (Describe) 02/09/2016  8:00  PM  Urinary Catheter Interventions Unclamped 02/09/2016  8:00 PM  Output (mL) 440 mL 02/10/2016  5:00 AM    Microbiology/Sepsis markers: No results found for this or any previous visit.  Anti-infectives:  Anti-infectives    None      Best Practice/Protocols:  VTE Prophylaxis: Mechanical Continous Sedation  Consults: Treatment Team:  Myrene Galas, MD    Studies:CXR - No PTX, less HTX, B  contusions  Subjective:    Overnight Issues:  Worsening resp failure Objective:  Vital signs for last 24 hours: Temp:  [93.9 F (34.4 C)-100.8 F (38.2 C)] 99.7 F (37.6 C) (04/04 0830) Pulse Rate:  [99-123] 115 (04/04 0832) Resp:  [10-37] 32 (04/04 0832) BP: (78-158)/(41-139) 86/45 mmHg (04/04 0832) SpO2:  [84 %-100 %] 96 % (04/04 0832) Arterial Line BP: (65-130)/(32-81) 71/42 mmHg (04/04 0830) FiO2 (%):  [60 %-100 %] 80 % (04/04 0832) Weight:  [127.9 kg (281 lb 15.5 oz)] 127.9 kg (281 lb 15.5 oz) (04/03 1515)  Hemodynamic parameters for last 24 hours: CVP:  [10 mmHg-24 mmHg] 19 mmHg  Intake/Output from previous day: 04/03 0701 - 04/04 0700 In: 16109 [I.V.:16901; UEAVW:0981; IV Piggyback:2120] Out: 5910 [Urine:2090; Emesis/NG output:50; Drains:2900; Blood:200; Chest Tube:670]  Intake/Output this shift: Total I/O In: 913.8 [I.V.:913.8] Out: -   Vent settings for last 24 hours: Vent Mode:  [-] PRVC FiO2 (%):  [60 %-100 %] 80 % Set Rate:  [16 bmp-30 bmp] 30 bmp Vt Set:  [490 mL-550 mL] 490 mL PEEP:  [5 cmH20-16 cmH20] 10 cmH20 Plateau Pressure:  [23 cmH20-34 cmH20] 26 cmH20  Physical Exam:  General: awake on vent Neuro: writing and answering questions HEENT/Neck: ETT and collar Resp: coarse but equal BS CVS: RRR tachy GI: open abdomen VAC with good seal, some blood collected under VAC drape Extremities: central and peripheral edema  L side hematoma B DP doppler per RN  Results for orders placed or performed during the hospital encounter of 02/09/16 (from the past 24 hour(s))  I-STAT 7, (LYTES, BLD GAS, ICA, H+H)     Status: Abnormal   Collection Time: 02/09/16  9:50 AM  Result Value Ref Range   pH, Arterial 6.863 (LL) 7.350 - 7.450   pCO2 arterial 44.1 35.0 - 45.0 mmHg   pO2, Arterial 131.0 (H) 80.0 - 100.0 mmHg   Bicarbonate 8.3 (L) 20.0 - 24.0 mEq/L   TCO2 10 0 - 100 mmol/L   O2 Saturation 96.0 %   Acid-base deficit 23.0 (H) 0.0 - 2.0 mmol/L   Sodium 142  135 - 145 mmol/L   Potassium 4.3 3.5 - 5.1 mmol/L   Calcium, Ion 0.71 (L) 1.12 - 1.23 mmol/L   HCT 17.0 (L) 36.0 - 46.0 %   Hemoglobin 5.8 (LL) 12.0 - 15.0 g/dL   Patient temperature 19.1 C    Sample type ARTERIAL    Comment NOTIFIED PHYSICIAN   I-STAT 4, (NA,K, GLUC, HGB,HCT)     Status: Abnormal   Collection Time: 02/09/16 10:13 AM  Result Value Ref Range   Sodium 146 (H) 135 - 145 mmol/L   Potassium 5.2 (H) 3.5 - 5.1 mmol/L   Glucose, Bld 368 (H) 65 - 99 mg/dL   HCT 47.8 (L) 29.5 - 62.1 %   Hemoglobin 7.1 (L) 12.0 - 15.0 g/dL  Prepare cryoprecipitate     Status: None (Preliminary result)   Collection Time: 02/09/16 10:30 AM  Result Value Ref Range   Unit Number [id number]    Blood Component Type CRYPOOL THAW  Unit division 00    Status of Unit ISSUED    Transfusion Status OK TO TRANSFUSE    Unit Number [id number]    Blood Component Type CRYPOOL THAW    Unit division 00    Status of Unit ISSUED    Transfusion Status OK TO TRANSFUSE    Unit Number [id number]    Blood Component Type CRYPOOL THAW    Unit division 00    Status of Unit ISSUED    Transfusion Status OK TO TRANSFUSE    Unit Number [id number]    Blood Component Type CRYPOOL THAW    Unit division 00    Status of Unit ALLOCATED    Transfusion Status OK TO TRANSFUSE   DIC (disseminated intravasc coag) panel (STAT)     Status: Abnormal   Collection Time: 02/09/16 10:40 AM  Result Value Ref Range   Prothrombin Time 23.5 (H) 11.6 - 15.2 seconds   INR 2.11 (H) 0.00 - 1.49   aPTT 80 (H) 24 - 37 seconds   Fibrinogen 143 (L) 204 - 475 mg/dL   D-Dimer, Quant 02.72 (H) 0.00 - 0.50 ug/mL-FEU   Platelets 87 (L) 150 - 400 K/uL   Smear Review NO SCHISTOCYTES SEEN   Hemoglobin and hematocrit, blood (STAT)     Status: Abnormal   Collection Time: 02/09/16 10:40 AM  Result Value Ref Range   Hemoglobin 7.8 (L) 12.0 - 15.0 g/dL   HCT 53.6 (L) 64.4 - 03.4 %  I-STAT 7, (LYTES, BLD GAS, ICA, H+H)     Status:  Abnormal   Collection Time: 02/09/16 11:41 AM  Result Value Ref Range   pH, Arterial 7.020 (LL) 7.350 - 7.450   pCO2 arterial 56.0 (H) 35.0 - 45.0 mmHg   pO2, Arterial 93.0 80.0 - 100.0 mmHg   Bicarbonate 15.0 (L) 20.0 - 24.0 mEq/L   TCO2 17 0 - 100 mmol/L   O2 Saturation 94.0 %   Acid-base deficit 16.0 (H) 0.0 - 2.0 mmol/L   Sodium 144 135 - 145 mmol/L   Potassium 6.3 (HH) 3.5 - 5.1 mmol/L   Calcium, Ion 0.56 (LL) 1.12 - 1.23 mmol/L   HCT 24.0 (L) 36.0 - 46.0 %   Hemoglobin 8.2 (L) 12.0 - 15.0 g/dL   Patient temperature 74.2 C    Sample type ARTERIAL   Fibrinogen (coagulopathy lab panel)     Status: None   Collection Time: 02/09/16  2:30 PM  Result Value Ref Range   Fibrinogen 234 204 - 475 mg/dL  Platelet count (coagulopathy lab panel)     Status: Abnormal   Collection Time: 02/09/16  2:30 PM  Result Value Ref Range   Platelets 87 (L) 150 - 400 K/uL  Hemoglobin and hematocrit, blood (coagulopathy lab panel)     Status: Abnormal   Collection Time: 02/09/16  2:30 PM  Result Value Ref Range   Hemoglobin 9.9 (L) 12.0 - 15.0 g/dL   HCT 59.5 (L) 63.8 - 75.6 %  Protime-INR (coagulopathy lab panel)     Status: Abnormal   Collection Time: 02/09/16  2:30 PM  Result Value Ref Range   Prothrombin Time 22.5 (H) 11.6 - 15.2 seconds   INR 1.99 (H) 0.00 - 1.49  APTT (coagulopathy lab panel)     Status: Abnormal   Collection Time: 02/09/16  2:30 PM  Result Value Ref Range   aPTT 63 (H) 24 - 37 seconds  Prepare Pheresed Platelets     Status: None (Preliminary result)  Collection Time: 02/09/16  2:39 PM  Result Value Ref Range   Unit Number [id number]    Blood Component Type PLTP LR1 PAS    Unit division 00    Status of Unit ISSUED    Transfusion Status OK TO TRANSFUSE   Lactic acid, plasma     Status: Abnormal   Collection Time: 02/09/16  3:05 PM  Result Value Ref Range   Lactic Acid, Venous 5.7 (HH) 0.5 - 2.0 mmol/L  Fibrinogen     Status: Abnormal   Collection Time: 02/09/16   3:05 PM  Result Value Ref Range   Fibrinogen 199 (L) 204 - 475 mg/dL  Protime-INR     Status: Abnormal   Collection Time: 02/09/16  3:05 PM  Result Value Ref Range   Prothrombin Time 23.1 (H) 11.6 - 15.2 seconds   INR 2.07 (H) 0.00 - 1.49  Comprehensive metabolic panel     Status: Abnormal   Collection Time: 02/09/16  3:55 PM  Result Value Ref Range   Sodium 146 (H) 135 - 145 mmol/L   Potassium 4.2 3.5 - 5.1 mmol/L   Chloride 116 (H) 101 - 111 mmol/L   CO2 18 (L) 22 - 32 mmol/L   Glucose, Bld 248 (H) 65 - 99 mg/dL   BUN 6 6 - 20 mg/dL   Creatinine, Ser 9.52 0.44 - 1.00 mg/dL   Calcium 6.6 (L) 8.9 - 10.3 mg/dL   Total Protein 3.8 (L) 6.5 - 8.1 g/dL   Albumin 2.1 (L) 3.5 - 5.0 g/dL   AST 841 (H) 15 - 41 U/L   ALT 144 (H) 14 - 54 U/L   Alkaline Phosphatase 35 (L) 38 - 126 U/L   Total Bilirubin 0.6 0.3 - 1.2 mg/dL   GFR calc non Af Amer >60 >60 mL/min   GFR calc Af Amer >60 >60 mL/min   Anion gap 12 5 - 15  Triglycerides     Status: None   Collection Time: 02/09/16  3:55 PM  Result Value Ref Range   Triglycerides 91 <150 mg/dL  Blood gas, arterial     Status: Abnormal   Collection Time: 02/09/16  4:10 PM  Result Value Ref Range   FIO2 0.90    Delivery systems VENTILATOR    Mode PRESSURE REGULATED VOLUME CONTROL    VT 550.0 mL   LHR 16.0 resp/min   Peep/cpap 5.0 cm H20   pH, Arterial 7.086 (LL) 7.350 - 7.450   pCO2 arterial 65.5 (HH) 35.0 - 45.0 mmHg   pO2, Arterial 145 (H) 80.0 - 100.0 mmHg   Bicarbonate 18.8 (L) 20.0 - 24.0 mEq/L   TCO2 20.8 0 - 100 mmol/L   Acid-base deficit 9.6 (H) 0.0 - 2.0 mmol/L   O2 Saturation 98.6 %   Patient temperature 98.6    Collection site A-LINE    Drawn by [phone]    Sample type ARTERIAL DRAW   Hemoglobin and hematocrit, blood     Status: Abnormal   Collection Time: 02/09/16  5:49 PM  Result Value Ref Range   Hemoglobin 10.0 (L) 12.0 - 15.0 g/dL   HCT 02.7 (L) 25.3 - 66.4 %  Blood gas, arterial     Status: Abnormal   Collection  Time: 02/09/16  5:50 PM  Result Value Ref Range   FIO2 0.80    Delivery systems VENTILATOR    Mode PRESSURE REGULATED VOLUME CONTROL    VT 550 mL   LHR 22 resp/min   Peep/cpap 5.0 cm  H20   pH, Arterial 7.175 (LL) 7.350 - 7.450   pCO2 arterial 53.2 (H) 35.0 - 45.0 mmHg   pO2, Arterial 129 (H) 80.0 - 100.0 mmHg   Bicarbonate 18.9 (L) 20.0 - 24.0 mEq/L   TCO2 20.5 0 - 100 mmol/L   Acid-base deficit 8.2 (H) 0.0 - 2.0 mmol/L   O2 Saturation 98.7 %   Patient temperature 98.6    Collection site A-LINE    Drawn by [phone]    Sample type ARTERIAL DRAW    Allens test (pass/fail) PASS PASS  Protime-INR     Status: Abnormal   Collection Time: 02/09/16  8:36 PM  Result Value Ref Range   Prothrombin Time 22.5 (H) 11.6 - 15.2 seconds   INR 2.00 (H) 0.00 - 1.49  CBC     Status: Abnormal   Collection Time: 02/09/16  8:36 PM  Result Value Ref Range   WBC 3.5 (L) 4.0 - 10.5 K/uL   RBC 1.29 (L) 3.87 - 5.11 MIL/uL   Hemoglobin 3.6 (LL) 12.0 - 15.0 g/dL   HCT 89.3 (L) 81.0 - 17.5 %   MCV 82.9 78.0 - 100.0 fL   MCH 27.9 26.0 - 34.0 pg   MCHC 33.6 30.0 - 36.0 g/dL   RDW 10.2 58.5 - 27.7 %   Platelets 59 (L) 150 - 400 K/uL  Fibrinogen     Status: Abnormal   Collection Time: 02/09/16  8:36 PM  Result Value Ref Range   Fibrinogen 168 (L) 204 - 475 mg/dL  CBC     Status: Abnormal   Collection Time: 02/09/16  9:16 PM  Result Value Ref Range   WBC 3.1 (L) 4.0 - 10.5 K/uL   RBC 1.40 (L) 3.87 - 5.11 MIL/uL   Hemoglobin 3.8 (LL) 12.0 - 15.0 g/dL   HCT 82.4 (L) 23.5 - 36.1 %   MCV 82.9 78.0 - 100.0 fL   MCH 27.1 26.0 - 34.0 pg   MCHC 32.8 30.0 - 36.0 g/dL   RDW 44.3 15.4 - 00.8 %   Platelets 78 (L) 150 - 400 K/uL  Protime-INR     Status: Abnormal   Collection Time: 02/09/16  9:16 PM  Result Value Ref Range   Prothrombin Time 21.1 (H) 11.6 - 15.2 seconds   INR 1.83 (H) 0.00 - 1.49  Prepare RBC     Status: None   Collection Time: 02/09/16  9:16 PM  Result Value Ref Range   Order Confirmation       ORDER PROCESSED BY BLOOD BANK BB SAMPLE OR UNITS ALREADY AVAILABLE  Prepare pheresed platelets     Status: None (Preliminary result)   Collection Time: 02/09/16  9:36 PM  Result Value Ref Range   Unit Number [id number]    Blood Component Type PLTPHER LR1    Unit division 00    Status of Unit ISSUED    Transfusion Status OK TO TRANSFUSE    Unit Number [id number]    Blood Component Type PLTP LR1 PAS    Unit division 00    Status of Unit ISSUED    Transfusion Status OK TO TRANSFUSE   Prepare cryoprecipitate     Status: None (Preliminary result)   Collection Time: 02/09/16  9:36 PM  Result Value Ref Range   Unit Number [id number]    Blood Component Type CRYPOOL THAW    Unit division 00    Status of Unit ISSUED    Transfusion Status OK TO TRANSFUSE   I-STAT 3,  arterial blood gas (G3+)     Status: Abnormal   Collection Time: 02/09/16 10:48 PM  Result Value Ref Range   pH, Arterial 7.119 (LL) 7.350 - 7.450   pCO2 arterial 59.7 (HH) 35.0 - 45.0 mmHg   pO2, Arterial 56.0 (L) 80.0 - 100.0 mmHg   Bicarbonate 19.3 (L) 20.0 - 24.0 mEq/L   TCO2 21 0 - 100 mmol/L   O2 Saturation 76.0 %   Acid-base deficit 9.0 (H) 0.0 - 2.0 mmol/L   Patient temperature 37.4 C    Collection site ARTERIAL LINE    Drawn by Operator    Sample type ARTERIAL    Comment NOTIFIED PHYSICIAN   CBC     Status: Abnormal   Collection Time: 02/10/16  1:00 AM  Result Value Ref Range   WBC 4.2 4.0 - 10.5 K/uL   RBC 2.13 (L) 3.87 - 5.11 MIL/uL   Hemoglobin 6.1 (LL) 12.0 - 15.0 g/dL   HCT 16.1 (L) 09.6 - 04.5 %   MCV 84.0 78.0 - 100.0 fL   MCH 28.6 26.0 - 34.0 pg   MCHC 34.1 30.0 - 36.0 g/dL   RDW 40.9 81.1 - 91.4 %   Platelets 145 (L) 150 - 400 K/uL  Protime-INR     Status: Abnormal   Collection Time: 02/10/16  1:00 AM  Result Value Ref Range   Prothrombin Time 18.6 (H) 11.6 - 15.2 seconds   INR 1.55 (H) 0.00 - 1.49  Fibrinogen     Status: None   Collection Time: 02/10/16  1:00 AM  Result Value  Ref Range   Fibrinogen 247 204 - 475 mg/dL  I-STAT 3, arterial blood gas (G3+)     Status: Abnormal   Collection Time: 02/10/16  1:05 AM  Result Value Ref Range   pH, Arterial 7.262 (L) 7.350 - 7.450   pCO2 arterial 48.3 (H) 35.0 - 45.0 mmHg   pO2, Arterial 132.0 (H) 80.0 - 100.0 mmHg   Bicarbonate 21.6 20.0 - 24.0 mEq/L   TCO2 23 0 - 100 mmol/L   O2 Saturation 98.0 %   Acid-base deficit 5.0 (H) 0.0 - 2.0 mmol/L   Patient temperature 37.8 C    Collection site ARTERIAL LINE    Drawn by Operator    Sample type ARTERIAL   Prepare RBC     Status: None   Collection Time: 02/10/16  1:45 AM  Result Value Ref Range   Order Confirmation ORDER PROCESSED BY BLOOD BANK   CBC     Status: Abnormal   Collection Time: 02/10/16  4:05 AM  Result Value Ref Range   WBC 6.0 4.0 - 10.5 K/uL   RBC 2.80 (L) 3.87 - 5.11 MIL/uL   Hemoglobin 8.2 (L) 12.0 - 15.0 g/dL   HCT 78.2 (L) 95.6 - 21.3 %   MCV 85.7 78.0 - 100.0 fL   MCH 29.3 26.0 - 34.0 pg   MCHC 34.2 30.0 - 36.0 g/dL   RDW 08.6 57.8 - 46.9 %   Platelets 129 (L) 150 - 400 K/uL  Comprehensive metabolic panel     Status: Abnormal   Collection Time: 02/10/16  4:05 AM  Result Value Ref Range   Sodium 141 135 - 145 mmol/L   Potassium 4.2 3.5 - 5.1 mmol/L   Chloride 113 (H) 101 - 111 mmol/L   CO2 15 (L) 22 - 32 mmol/L   Glucose, Bld 86 65 - 99 mg/dL   BUN 8 6 - 20 mg/dL  Creatinine, Ser 1.22 (H) 0.44 - 1.00 mg/dL   Calcium 6.0 (LL) 8.9 - 10.3 mg/dL   Total Protein 4.2 (L) 6.5 - 8.1 g/dL   Albumin 2.4 (L) 3.5 - 5.0 g/dL   AST 086467 (H) 15 - 41 U/L   ALT 266 (H) 14 - 54 U/L   Alkaline Phosphatase 43 38 - 126 U/L   Total Bilirubin 1.2 0.3 - 1.2 mg/dL   GFR calc non Af Amer 58 (L) >60 mL/min   GFR calc Af Amer >60 >60 mL/min   Anion gap 13 5 - 15  Protime-INR     Status: Abnormal   Collection Time: 02/10/16  4:05 AM  Result Value Ref Range   Prothrombin Time 19.3 (H) 11.6 - 15.2 seconds   INR 1.63 (H) 0.00 - 1.49    Assessment &  Plan: Present on Admission:  . Fall from, out of or through building, not otherwise specified, initial encounter . Hypovolemic shock (HCC)   LOS: 1 day   Additional comments:I reviewed the patient's new clinical lab test results. and CXR Jump from hotel/ingestion liquid plumber S/P ex lap, hepatorraphy, SBR, cholecystectomy, preperitoneal pelvic packing [name] 4/3 - plan return to OR 4/5 Possible celiac artery intimal injury - re-eval in OR tomorrow S/P ex fix pelvis [name] 4/3 Resp failure - worsened overnight, sats improved, wean PEEP to 10 this AM, F/U ABG, add BDs R HPTX, B pulm contisons Caustic ingestion - GI consult, will need EGD to eval and I spoke with Dr. Meda Klinefelterenis, this is a significant potential septic source CV - levo and vaso, hopefully BP will improve with blood resuscitation ABL anemia - TF 2U PRBC and 2U FFP now, F/U CBC Pelvic FX - S/P ex fix and angioembolization FEN - hypocalcemia - replace, no TF yet VTE - PAS  Dispo - ICU I spoke with her sister at the bedside   Critical Care Total Time*: 7477 Minutes  [name] Enzio Buchler, MD, MPH, FACS Trauma: [phone] General Surgery: [phone]  02/10/2016  *Care during the described time interval was provided by me. I have reviewed this patient's available data, including medical history, events of note, physical examination and test results as part of my evaluation.

## 2016-02-10 NOTE — Op Note (Signed)
Procedure:   EGD   Meds:   None (patient hypotensive on pressors)  Indication:  Caustic ingestion  Findings:   Esophagus: No injury .  Esophagus normal   Stomach:  Acute  linear ulcer on lesser curve.  No stigmata of bleeding   Duodenum:  normal   Impression:  No esophageal injury from alkali ingestion Gastric ulcer may be from the ingestion, but looks more like from OGT pressure Recommendations: Protonix 40 mg IV BID   Carloyn MannerHenry L Danis III  GI Pager [phone]

## 2016-02-10 NOTE — Anesthesia Preprocedure Evaluation (Addendum)
Anesthesia Evaluation  Patient identified by MRN, date of birth, ID band Patient awake    Reviewed: Allergy & Precautions, NPO status , Patient's Chart, lab work & pertinent test results  Airway Mallampati: Intubated  TM Distance: >3 FB Neck ROM: Limited    Dental  (+) Teeth Intact, Dental Advisory Given   Pulmonary  Acute respiratory failure s/p intubation     unstable + intubated    Cardiovascular  Rhythm:Regular Rate:Tachycardia  Hypotensive requiring pressor support   Neuro/Psych Suicide attempt 4/3/17negative neurological ROS     GI/Hepatic negative GI ROS, Neg liver ROS, Allegedly ingested liquid drain-o, 2 bottles liquid tylenol EGD today showed stomach ulcer Concern for dead bowel   Endo/Other  Morbid obesity  Renal/GU negative Renal ROS     Musculoskeletal S/p pelvic ex-fix after jumping from 5 story building   Abdominal (+) + obese,   Peds  Hematology  (+) Blood dyscrasia, anemia ,   Anesthesia Other Findings Day of surgery medications reviewed with the patient.  Reproductive/Obstetrics negative OB ROS                            Anesthesia Physical Anesthesia Plan  ASA: V and emergent  Anesthesia Plan: General   Post-op Pain Management:    Induction: Inhalational  Airway Management Planned: Oral ETT  Additional Equipment:   Intra-op Plan:   Post-operative Plan: Post-operative intubation/ventilation  Informed Consent: I have reviewed the patients History and Physical, chart, labs and discussed the procedure including the risks, benefits and alternatives for the proposed anesthesia with the patient or authorized representative who has indicated his/her understanding and acceptance.   Dental advisory given  Plan Discussed with: CRNA  Anesthesia Plan Comments: (Patient brought directly to OR as emergent ex-lap. Patient intubated, sedated on pressors for hypotension.)         Anesthesia Quick Evaluation

## 2016-02-10 NOTE — Progress Notes (Signed)
Pt transported to CT on vent, had to increase fio2 and peep due to decreased spo2 87%, pt now  On fio2 100% and peep 12, RT will monitor.

## 2016-02-10 NOTE — Progress Notes (Signed)
Patient ID: [name] Bell, female   DOB: [birth year], 35 y.o.   MRN: [id number] I spoke with her sister about the plan to return to the OR with Dr. Lindie SpruceWyatt tomorrow. Plan ex alp, possible bowel resection. I discussed the risks and benefits. She agrees. I also let her know that GI plans to do EGD today. [name] GelinasBurke Lilac Hoff, MD, MPH, FACS Trauma: [phone] General Surgery: [phone]

## 2016-02-10 NOTE — Progress Notes (Signed)
Notified Trauma MD that pt still hypotensive after decreasing PEEP to 12. MAP 55. Orders given to decrease PEEP to 10. RT notified.

## 2016-02-10 NOTE — Op Note (Addendum)
02/09/2016 - 02/10/2016  7:15 PM  PATIENT:  [name] Bell  35 y.o. female  PRE-OPERATIVE DIAGNOSIS:  Open abdomen, possible ischemic bowel  POST-OPERATIVE DIAGNOSIS:  No ischemic bowel, mild hemorrhage from liver laceration  PROCEDURE:  Procedure(s): EXPLORATORY LAPAROTOMY REMOVAL OF PACKS (8) CAUTERIZATION OF LIVER LACERATION PACKING OF LIVER (2) CLOSURE WITH OPEN ABDOMEN VAC  SURGEON:  [name] Geneieve Duell, M.D.  ASSISTANTS: Manus RuddMatthew Tsuei, M.D.   ANESTHESIA:   general  EBL:  Total I/O In: 335 [Blood:335] Out: 200 [Blood:200]  BLOOD ADMINISTERED:2U CC PRBC  DRAINS: none   SPECIMEN:  No Specimen  DISPOSITION OF SPECIMEN:  N/A  COUNTS:  YES  DICTATION: .Dragon Dictation Findings: Further bleeding from liver laceration, contusion of rectosigmoid colon, no evidence of ischemic or necrotic bowel  Procedure in detail: Ms [name] is status post exploratory laparotomy, hepatorrhaphy, small bowel resection, pre-peritoneal pelvic packing, and packing of the abdomen yesterday status post a suicide attempt when she jumped from 5th story balcony. Today she has been progressively acidotic despite resuscitative attempts. She is return emergently to the operating room for exploration. Informed consent was obtained from her sister. She was brought directly from the intensive care unit to the operating room on the ventilator in critical condition. She received IV antibiotics. General endotracheal anesthesia was administered by the anesthesia staff. The outer portions of her VAC were removed. Abdomen was prepped and draped in sterile fashion. We did a time out procedure. The inner VAC drape was removed. There were some scattered blood clots but no immediate active bleeding. The abdomen was then explored. 3 packs were removed from around her liver. 1 pack was removed from the left lower quadrant. Within open the preperitoneal space by removing the suture and 4 packs were removed from the preperitoneal  pelvic packing. This was total of 8 which corresponded with the previous record. The small bowel was run and there was no evidence of ischemia. The previous anastomosis was viable. The colon was inspected. There was some rectosigmoid contusion present but no evidence of ischemia or necrosis. The remainder the colon was intact. There is no significant further bleeding from the pelvic area. Her left ovary appeared somewhat contused and with a ruptured cyst. Attention was redirected to the liver. There was some active bleeding from the liver laceration beneath the previous location of the gallbladder fossa. This was cauterized and a piece of Evarrest was placed. This seemed to help significantly. There was also some oozing from the lacerations anteriorly on surface of the liver. 2 packs were replaced up above the liver and there was fairly good hemostasis at this time. The remainder the abdomen was irrigated. The bowel was returned to anatomic position. We placed an open abdomen VAC in standard fashion. We tucked the inner VAC drape around the bowel carefully. 2 blue sponges were fashioned on top and the VAC treatment was then applied after prepping the skin. This was hooked up to the VAC pump and there was an excellent seal. All counts were correct including the 558 old laparotomy sponges were removed and the 2 new ones were left up on top of the liver. She remained in critical condition and taken directly back to the intensive care unit on the ventilator.  Plan further blood resuscitation and bicarbonate drip for possible acute kidney injury. PATIENT DISPOSITION:  ICU - intubated and critically ill.   Delay start of Pharmacological VTE agent (>24hrs) due to surgical blood loss or risk of bleeding:  yes  [name] Euphemia Lingerfelt,  MD, MPH, FACS Pager: [phone]  4/4/20177:15 PM

## 2016-02-11 ENCOUNTER — Inpatient Hospital Stay (HOSPITAL_COMMUNITY): Payer: Medicaid Other | Admitting: Certified Registered Nurse Anesthetist

## 2016-02-11 ENCOUNTER — Encounter (HOSPITAL_COMMUNITY): Admission: EM | Disposition: A | Discharge status: Rehabilitation Facility | Payer: Self-pay | Source: Home / Self Care

## 2016-02-11 ENCOUNTER — Inpatient Hospital Stay (HOSPITAL_COMMUNITY): Payer: Medicaid Other

## 2016-02-11 ENCOUNTER — Encounter (HOSPITAL_COMMUNITY): Payer: Self-pay | Admitting: Gastroenterology

## 2016-02-11 ENCOUNTER — Inpatient Hospital Stay (HOSPITAL_COMMUNITY)
Admission: EM | Disposition: A | Discharge status: Rehabilitation Facility | Payer: Medicaid Other | Source: Home / Self Care

## 2016-02-11 HISTORY — PX: LAPAROTOMY: SHX154

## 2016-02-11 HISTORY — PX: CHEST TUBE INSERTION: SHX231

## 2016-02-11 LAB — CBC WITH DIFFERENTIAL/PLATELET
BASOS ABS: 0 10*3/uL (ref 0.0–0.1)
BASOS PCT: 0 %
Eosinophils Absolute: 0 10*3/uL (ref 0.0–0.7)
Eosinophils Relative: 0 %
HCT: 25.7 % — ABNORMAL LOW (ref 36.0–46.0)
HEMOGLOBIN: 8.9 g/dL — AB (ref 12.0–15.0)
LYMPHS ABS: 1.2 10*3/uL (ref 0.7–4.0)
LYMPHS PCT: 7 %
MCH: 30 pg (ref 26.0–34.0)
MCHC: 34.6 g/dL (ref 30.0–36.0)
MCV: 86.5 fL (ref 78.0–100.0)
Monocytes Absolute: 1.1 10*3/uL — ABNORMAL HIGH (ref 0.1–1.0)
Monocytes Relative: 6 %
Neutro Abs: 15.5 10*3/uL — ABNORMAL HIGH (ref 1.7–7.7)
Neutrophils Relative %: 87 %
PLATELETS: 138 10*3/uL — AB (ref 150–400)
RBC: 2.97 MIL/uL — AB (ref 3.87–5.11)
RDW: 15.4 % (ref 11.5–15.5)
WBC Morphology: INCREASED
WBC: 17.8 10*3/uL — ABNORMAL HIGH (ref 4.0–10.5)

## 2016-02-11 LAB — BLOOD GAS, ARTERIAL
ACID-BASE DEFICIT: 18.7 mmol/L — AB (ref 0.0–2.0)
Acid-base deficit: 11.6 mmol/L — ABNORMAL HIGH (ref 0.0–2.0)
Acid-base deficit: 18.5 mmol/L — ABNORMAL HIGH (ref 0.0–2.0)
BICARBONATE: 14.3 meq/L — AB (ref 20.0–24.0)
Bicarbonate: 9.6 mEq/L — ABNORMAL LOW (ref 20.0–24.0)
Bicarbonate: 9 mEq/L — ABNORMAL LOW (ref 20.0–24.0)
DRAWN BY: 280981
Drawn by: 44956
FIO2: 0.8
FIO2: 0.9
FIO2: 0.9
LHR: 30 {breaths}/min
MECHVT: 490 mL
O2 Saturation: 95.1 %
O2 Saturation: 93.9 %
O2 Saturation: 97.1 %
PATIENT TEMPERATURE: 97.5
PEEP: 10 cmH2O
PEEP: 12 cmH2O
PEEP: 12 cmH2O
PH ART: 7.112 — AB (ref 7.350–7.450)
PO2 ART: 79.5 mmHg — AB (ref 80.0–100.0)
Patient temperature: 98.6
Patient temperature: 97.4
RATE: 30 resp/min
RATE: 30 resp/min
TCO2: 15.4 mmol/L (ref 0–100)
TCO2: 10.6 mmol/L (ref 0–100)
TCO2: 9.9 mmol/L (ref 0–100)
VT: 490 mL
VT: 490 mL
pCO2 arterial: 34.9 mmHg — ABNORMAL LOW (ref 35.0–45.0)
pCO2 arterial: 33.7 mmHg — ABNORMAL LOW (ref 35.0–45.0)
pCO2 arterial: 29 mmHg — ABNORMAL LOW (ref 35.0–45.0)
pH, Arterial: 7.237 — ABNORMAL LOW (ref 7.350–7.450)
pH, Arterial: 7.077 — CL (ref 7.350–7.450)
pO2, Arterial: 79.2 mmHg — ABNORMAL LOW (ref 80.0–100.0)
pO2, Arterial: 99.2 mmHg (ref 80.0–100.0)

## 2016-02-11 LAB — PREPARE PLATELET PHERESIS
UNIT DIVISION: 0
Unit division: 0

## 2016-02-11 LAB — POCT I-STAT 3, ART BLOOD GAS (G3+)
ACID-BASE DEFICIT: 5 mmol/L — AB (ref 0.0–2.0)
Bicarbonate: 20.8 mEq/L (ref 20.0–24.0)
O2 SAT: 96 %
PH ART: 7.329 — AB (ref 7.350–7.450)
PO2 ART: 83 mmHg (ref 80.0–100.0)
TCO2: 22 mmol/L (ref 0–100)
pCO2 arterial: 39.2 mmHg (ref 35.0–45.0)

## 2016-02-11 LAB — BASIC METABOLIC PANEL
ANION GAP: 25 — AB (ref 5–15)
BUN: 13 mg/dL (ref 6–20)
CALCIUM: 5.9 mg/dL — AB (ref 8.9–10.3)
CO2: 10 mmol/L — AB (ref 22–32)
CREATININE: 1.91 mg/dL — AB (ref 0.44–1.00)
Chloride: 109 mmol/L (ref 101–111)
GFR, EST AFRICAN AMERICAN: 39 mL/min — AB (ref >60)
GFR, EST NON AFRICAN AMERICAN: 34 mL/min — AB (ref >60)
GLUCOSE: 173 mg/dL — AB (ref 65–99)
Potassium: 4.1 mmol/L (ref 3.5–5.1)
Sodium: 144 mmol/L (ref 135–145)

## 2016-02-11 LAB — POCT I-STAT 7, (LYTES, BLD GAS, ICA,H+H)
ACID-BASE DEFICIT: 9 mmol/L — AB (ref 0.0–2.0)
ACID-BASE DEFICIT: 7 mmol/L — AB (ref 0.0–2.0)
BICARBONATE: 18.4 meq/L — AB (ref 20.0–24.0)
BICARBONATE: 19.4 meq/L — AB (ref 20.0–24.0)
CALCIUM ION: 0.8 mmol/L — AB (ref 1.12–1.23)
CALCIUM ION: 0.71 mmol/L — AB (ref 1.12–1.23)
HCT: 17 % — ABNORMAL LOW (ref 36.0–46.0)
HEMATOCRIT: 21 % — AB (ref 36.0–46.0)
HEMOGLOBIN: 5.8 g/dL — AB (ref 12.0–15.0)
Hemoglobin: 7.1 g/dL — ABNORMAL LOW (ref 12.0–15.0)
O2 SAT: 92 %
O2 Saturation: 79 %
PH ART: 7.161 — AB (ref 7.350–7.450)
PH ART: 7.274 — AB (ref 7.350–7.450)
POTASSIUM: 3.4 mmol/L — AB (ref 3.5–5.1)
POTASSIUM: 3.4 mmol/L — AB (ref 3.5–5.1)
SODIUM: 139 mmol/L (ref 135–145)
SODIUM: 141 mmol/L (ref 135–145)
TCO2: 20 mmol/L (ref 0–100)
TCO2: 21 mmol/L (ref 0–100)
pCO2 arterial: 51.2 mmHg — ABNORMAL HIGH (ref 35.0–45.0)
pCO2 arterial: 41.9 mmHg (ref 35.0–45.0)
pO2, Arterial: 53 mmHg — ABNORMAL LOW (ref 80.0–100.0)
pO2, Arterial: 71 mmHg — ABNORMAL LOW (ref 80.0–100.0)

## 2016-02-11 LAB — HEMOGLOBIN AND HEMATOCRIT, BLOOD
HCT: 21.6 % — ABNORMAL LOW (ref 36.0–46.0)
Hemoglobin: 7.2 g/dL — ABNORMAL LOW (ref 12.0–15.0)

## 2016-02-11 LAB — CBC
HCT: 13.5 % — ABNORMAL LOW (ref 36.0–46.0)
HEMATOCRIT: 21.5 % — AB (ref 36.0–46.0)
HEMOGLOBIN: 4.7 g/dL — AB (ref 12.0–15.0)
Hemoglobin: 7.4 g/dL — ABNORMAL LOW (ref 12.0–15.0)
MCH: 29.7 pg (ref 26.0–34.0)
MCH: 28.7 pg (ref 26.0–34.0)
MCHC: 34.4 g/dL (ref 30.0–36.0)
MCHC: 34.8 g/dL (ref 30.0–36.0)
MCV: 86.3 fL (ref 78.0–100.0)
MCV: 82.3 fL (ref 78.0–100.0)
PLATELETS: 124 10*3/uL — AB (ref 150–400)
PLATELETS: 48 10*3/uL — AB (ref 150–400)
RBC: 2.49 MIL/uL — ABNORMAL LOW (ref 3.87–5.11)
RBC: 1.64 MIL/uL — AB (ref 3.87–5.11)
RDW: 15.7 % — AB (ref 11.5–15.5)
RDW: 15.4 % (ref 11.5–15.5)
WBC: 20.3 10*3/uL — AB (ref 4.0–10.5)
WBC: 15.2 10*3/uL — ABNORMAL HIGH (ref 4.0–10.5)

## 2016-02-11 LAB — HEPATIC FUNCTION PANEL
ALBUMIN: 1.9 g/dL — AB (ref 3.5–5.0)
ALK PHOS: 41 U/L (ref 38–126)
ALT: 768 U/L — ABNORMAL HIGH (ref 14–54)
AST: 1300 U/L — AB (ref 15–41)
Bilirubin, Direct: 1.4 mg/dL — ABNORMAL HIGH (ref 0.1–0.5)
Indirect Bilirubin: 1.2 mg/dL — ABNORMAL HIGH (ref 0.3–0.9)
TOTAL PROTEIN: 3.3 g/dL — AB (ref 6.5–8.1)
Total Bilirubin: 2.6 mg/dL — ABNORMAL HIGH (ref 0.3–1.2)

## 2016-02-11 LAB — MRSA PCR SCREENING: MRSA BY PCR: NEGATIVE

## 2016-02-11 LAB — BLOOD PRODUCT ORDER (VERBAL) VERIFICATION

## 2016-02-11 LAB — PROTIME-INR
INR: 4.52 — AB (ref 0.00–1.49)
INR: 2.59 — ABNORMAL HIGH (ref 0.00–1.49)
INR: 2.48 — AB (ref 0.00–1.49)
INR: 1.98 — ABNORMAL HIGH (ref 0.00–1.49)
PROTHROMBIN TIME: 26.5 s — AB (ref 11.6–15.2)
Prothrombin Time: 41.7 seconds — ABNORMAL HIGH (ref 11.6–15.2)
Prothrombin Time: 27.4 seconds — ABNORMAL HIGH (ref 11.6–15.2)
Prothrombin Time: 22.4 seconds — ABNORMAL HIGH (ref 11.6–15.2)

## 2016-02-11 LAB — PREPARE RBC (CROSSMATCH)

## 2016-02-11 LAB — GLUCOSE, CAPILLARY: Glucose-Capillary: 251 mg/dL — ABNORMAL HIGH (ref 65–99)

## 2016-02-11 LAB — FIBRINOGEN: Fibrinogen: 205 mg/dL (ref 204–475)

## 2016-02-11 LAB — LACTIC ACID, PLASMA: Lactic Acid, Venous: 13.7 mmol/L (ref 0.5–2.0)

## 2016-02-11 SURGERY — LAPAROTOMY, EXPLORATORY
Anesthesia: General | Site: Chest | Laterality: Right

## 2016-02-11 SURGERY — LAPAROTOMY, EXPLORATORY
Anesthesia: General

## 2016-02-11 MED ORDER — SODIUM CHLORIDE 0.9 % IV SOLN
Freq: Once | INTRAVENOUS | Status: AC
  Administered 2016-02-11 (×2): via INTRAVENOUS

## 2016-02-11 MED ORDER — MIDAZOLAM HCL 5 MG/5ML IJ SOLN
INTRAMUSCULAR | Status: DC | PRN
  Administered 2016-02-11 (×2): 2 mg via INTRAVENOUS

## 2016-02-11 MED ORDER — SODIUM CHLORIDE 0.9 % IV SOLN
Freq: Once | INTRAVENOUS | Status: AC
  Administered 2016-02-11: 09:00:00 via INTRAVENOUS

## 2016-02-11 MED ORDER — ALBUMIN HUMAN 5 % IV SOLN
12.5000 g | Freq: Once | INTRAVENOUS | Status: AC
  Administered 2016-02-11: 12.5 g via INTRAVENOUS
  Filled 2016-02-11: qty 250

## 2016-02-11 MED ORDER — FUROSEMIDE 10 MG/ML IJ SOLN
40.0000 mg | Freq: Once | INTRAMUSCULAR | Status: AC
  Administered 2016-02-11: 40 mg via INTRAVENOUS

## 2016-02-11 MED ORDER — SODIUM CHLORIDE 0.9 % IV SOLN
Freq: Once | INTRAVENOUS | Status: AC
  Administered 2016-02-12: 23:00:00 via INTRAVENOUS

## 2016-02-11 MED ORDER — VITAMIN K1 10 MG/ML IJ SOLN
5.0000 mg | Freq: Once | INTRAVENOUS | Status: AC
  Administered 2016-02-11: 5 mg via INTRAVENOUS
  Filled 2016-02-11: qty 0.5

## 2016-02-11 MED ORDER — LACTATED RINGERS IV SOLN
INTRAVENOUS | Status: DC | PRN
  Administered 2016-02-11 (×2): via INTRAVENOUS

## 2016-02-11 MED ORDER — 0.9 % SODIUM CHLORIDE (POUR BTL) OPTIME
TOPICAL | Status: DC | PRN
  Administered 2016-02-11 (×2): 1000 mL
  Administered 2016-02-11: 500 mL

## 2016-02-11 MED ORDER — DEXTROSE 5 % IV SOLN
5.0000 mg | Freq: Once | INTRAVENOUS | Status: AC
  Administered 2016-02-11: 5 mg via INTRAVENOUS
  Filled 2016-02-11: qty 0.5

## 2016-02-11 MED ORDER — FENTANYL CITRATE (PF) 250 MCG/5ML IJ SOLN
INTRAMUSCULAR | Status: AC
  Filled 2016-02-11: qty 5

## 2016-02-11 MED ORDER — LIDOCAINE HCL (PF) 1 % IJ SOLN
30.0000 mL | Freq: Once | INTRAMUSCULAR | Status: DC
  Filled 2016-02-11: qty 30

## 2016-02-11 MED ORDER — ROCURONIUM BROMIDE 100 MG/10ML IV SOLN
INTRAVENOUS | Status: DC | PRN
  Administered 2016-02-11 (×2): 50 mg via INTRAVENOUS

## 2016-02-11 MED ORDER — LIDOCAINE HCL (PF) 1 % IJ SOLN
INTRAMUSCULAR | Status: AC
  Filled 2016-02-11: qty 10

## 2016-02-11 MED ORDER — SODIUM BICARBONATE 8.4 % IV SOLN
INTRAVENOUS | Status: AC
  Filled 2016-02-11: qty 150

## 2016-02-11 MED ORDER — PHENYLEPHRINE HCL 10 MG/ML IJ SOLN
INTRAMUSCULAR | Status: DC | PRN
  Administered 2016-02-11 (×2): 120 ug via INTRAVENOUS

## 2016-02-11 MED ORDER — SODIUM BICARBONATE 4.2 % IV SOLN
INTRAVENOUS | Status: DC | PRN
  Administered 2016-02-11: 150 meq via INTRAVENOUS

## 2016-02-11 MED ORDER — SODIUM CHLORIDE 0.9 % IV SOLN
Freq: Once | INTRAVENOUS | Status: AC
  Administered 2016-02-11: 01:00:00 via INTRAVENOUS

## 2016-02-11 MED ORDER — WHITE PETROLATUM GEL
Status: AC
  Administered 2016-02-11: 0.2
  Filled 2016-02-11: qty 1

## 2016-02-11 MED ORDER — MIDAZOLAM HCL 2 MG/2ML IJ SOLN
INTRAMUSCULAR | Status: AC
  Filled 2016-02-11: qty 4

## 2016-02-11 MED ORDER — SODIUM CHLORIDE 0.9 % IV SOLN: Freq: Once | INTRAVENOUS | Status: AC

## 2016-02-11 MED ORDER — FUROSEMIDE 10 MG/ML IJ SOLN
INTRAMUSCULAR | Status: AC
  Filled 2016-02-11: qty 4

## 2016-02-11 SURGICAL SUPPLY — 47 items
BENZOIN TINCTURE PRP APPL 2/3 (GAUZE/BANDAGES/DRESSINGS) ×8 IMPLANT
BLADE SURG ROTATE 9660 (MISCELLANEOUS) IMPLANT
CANISTER SUCTION 2500CC (MISCELLANEOUS) ×4 IMPLANT
CANISTER WOUND CARE 500ML ATS (WOUND CARE) ×12 IMPLANT
CATH THORACIC 40FR (CATHETERS) ×4 IMPLANT
CATH TROCAR 32FR (CATHETERS) ×4 IMPLANT
CHLORAPREP W/TINT 26ML (MISCELLANEOUS) ×4 IMPLANT
COVER SURGICAL LIGHT HANDLE (MISCELLANEOUS) ×4 IMPLANT
DRAPE LAPAROSCOPIC ABDOMINAL (DRAPES) ×4 IMPLANT
DRAPE WARM FLUID 44X44 (DRAPE) ×4 IMPLANT
DRSG OPSITE POSTOP 4X10 (GAUZE/BANDAGES/DRESSINGS) IMPLANT
DRSG OPSITE POSTOP 4X8 (GAUZE/BANDAGES/DRESSINGS) IMPLANT
DRSG VAC ATS LRG SENSATRAC (GAUZE/BANDAGES/DRESSINGS) ×4 IMPLANT
ELECT BLADE 6.5 EXT (BLADE) IMPLANT
ELECT CAUTERY BLADE 6.4 (BLADE) ×4 IMPLANT
ELECT REM PT RETURN 9FT ADLT (ELECTROSURGICAL) ×4
ELECTRODE REM PT RTRN 9FT ADLT (ELECTROSURGICAL) ×2 IMPLANT
GAUZE SPONGE 4X4 12PLY STRL (GAUZE/BANDAGES/DRESSINGS) ×4 IMPLANT
GAUZE VASELINE FOILPK 1/2 X 72 (GAUZE/BANDAGES/DRESSINGS) ×4 IMPLANT
GLOVE BIOGEL PI IND STRL 8 (GLOVE) ×2 IMPLANT
GLOVE BIOGEL PI INDICATOR 8 (GLOVE) ×2
GLOVE ECLIPSE 7.5 STRL STRAW (GLOVE) ×4 IMPLANT
GOWN STRL REUS W/ TWL LRG LVL3 (GOWN DISPOSABLE) ×4 IMPLANT
GOWN STRL REUS W/TWL LRG LVL3 (GOWN DISPOSABLE) ×4
KIT BASIN OR (CUSTOM PROCEDURE TRAY) ×4 IMPLANT
KIT ROOM TURNOVER OR (KITS) ×4 IMPLANT
LIGASURE IMPACT 36 18CM CVD LR (INSTRUMENTS) IMPLANT
NS IRRIG 1000ML POUR BTL (IV SOLUTION) ×8 IMPLANT
PACK GENERAL/GYN (CUSTOM PROCEDURE TRAY) ×4 IMPLANT
PAD ARMBOARD 7.5X6 YLW CONV (MISCELLANEOUS) ×4 IMPLANT
SEPRAFILM PROCEDURAL PACK 3X5 (MISCELLANEOUS) IMPLANT
SPECIMEN JAR LARGE (MISCELLANEOUS) IMPLANT
SPONGE ABDOMINAL VAC ABTHERA (MISCELLANEOUS) ×4 IMPLANT
SPONGE LAP 18X18 X RAY DECT (DISPOSABLE) ×8 IMPLANT
STAPLER VISISTAT 35W (STAPLE) ×4 IMPLANT
SUCTION POOLE TIP (SUCTIONS) ×4 IMPLANT
SUT NOVA 1 T20/GS 25DT (SUTURE) IMPLANT
SUT PDS AB 1 TP1 96 (SUTURE) IMPLANT
SUT SILK 0 FSL (SUTURE) ×4 IMPLANT
SUT SILK 2 0 SH CR/8 (SUTURE) ×4 IMPLANT
SUT SILK 2 0 TIES 10X30 (SUTURE) ×4 IMPLANT
SUT SILK 3 0 SH CR/8 (SUTURE) ×4 IMPLANT
SUT SILK 3 0 TIES 10X30 (SUTURE) ×4 IMPLANT
TAPE CLOTH SURG 6X10 WHT LF (GAUZE/BANDAGES/DRESSINGS) ×4 IMPLANT
TOWEL OR 17X26 10 PK STRL BLUE (TOWEL DISPOSABLE) ×4 IMPLANT
TRAY FOLEY CATH 16FRSI W/METER (SET/KITS/TRAYS/PACK) IMPLANT
YANKAUER SUCT BULB TIP NO VENT (SUCTIONS) ×4 IMPLANT

## 2016-02-11 NOTE — Progress Notes (Signed)
Dr. Corliss Skainssuei notified of patient's BP trending down, CBC results, and patient's report of difficulty breathing. Orders received and carried out.

## 2016-02-11 NOTE — Transfer of Care (Signed)
Immediate Anesthesia Transfer of Care Note  Patient: Phylliss BobKatia D Dorsi  Procedure(s) Performed: Procedure(s): EXPLORATORY LAPAROTOMY VAC CHANGE  (N/A) CHEST TUBE INSERTION (Right)  Patient Location: ICU  Anesthesia Type:General  Level of Consciousness: sedated and Patient remains intubated per anesthesia plan  Airway & Oxygen Therapy: Patient remains intubated per anesthesia plan and Patient placed on Ventilator (see vital sign flow sheet for setting)  Post-op Assessment: Report given to RN and Post -op Vital signs reviewed and stable  Post vital signs: Reviewed and stable  Last Vitals:  Filed Vitals:   02/11/16 1945 02/11/16 2153  BP: 129/71 136/83  Pulse: 118 117  Temp:    Resp: 19 35    Complications: No apparent anesthesia complications

## 2016-02-11 NOTE — Progress Notes (Signed)
CRITICAL VALUE ALERT  Critical value received: Calcium 5.9  Date of notification:  4/5  Time of notification:  605  Critical value read back:Yes.    Nurse who received alert:  Ellie  MD notified (1st page):  Tsuei  Time of first page: 609  MD notified (2nd page): Lindie SpruceWyatt  Time of second page:[phone]  Responding MD:  wyatt  Time MD responded:  703

## 2016-02-11 NOTE — Progress Notes (Signed)
RT transported patient from OR back to 46M without any complications

## 2016-02-11 NOTE — Consult Note (Signed)
CHART REVIEWED ASK TO SEE PATIENT FOR LEFT WRIST INJURY PT WITH SCAPHOID WAIST FRACTURE, LEFT TRAPEZIUM AND HAMATE FRACTURE AND BASE OF SECOND METACARPAL FRACTURES THESE ARE CLOSED INJURIES AND AT THE CURRENT TIME CAN BE TREATED NONOPERATIVELY UNTIL HER OVERALL CONDITION ALLOWS FOR ELECTIVE HAND SURGERY SHORT ARM BRACE IS FINE FOR COMFORT IF NEEDED, ICE, ELEVATE HAND IF PATIENT REMAINS IN [place], AS NOTE STATES SHE IS FROM [place]OWN THESE INJURIES CAN BE MANAGED AS AN OUTPATIENT PLEASE CALL ME DIRECTLY [phone] IF ANY QUESTIONS ABOUT LEFT WRIST

## 2016-02-11 NOTE — Progress Notes (Signed)
RT bag lavaged patient getting up a blood clot and bloody secretions. Moderate amounts suctioned. Sats staying at 87-89%. RN at bedside. MD aware.

## 2016-02-11 NOTE — Op Note (Signed)
OPERATIVE REPORT  DATE OF OPERATION:  02/11/2016  PATIENT:  Phylliss BobKatia D Esquilin  [age] y.o. female  PRE-OPERATIVE DIAGNOSIS:  Shock and hemoperitoneum  POST-OPERATIVE DIAGNOSIS:  Same  PROCEDURE:  Procedure(s): EXPLORATORY LAPAROTOMY VAC CHANGE  CHEST TUBE INSERTION  SURGEON:  Surgeon(s): Jimmye NormanJames Tyquasia Pant, MD Forde Dandyoby C Windham, MD  ASSISTANT: Morene RankinsWindham, M.D.  ANESTHESIA:   general   COMPLICATIONS:  Hypotension and hemorrhage.  EBL: 300 ml  BLOOD ADMINISTERED: 700 CC PRBC and 2U FFP  DRAINS: Urinary Catheter (Foley), Negative pressure wound dressing Chest Tube(s) in the right pleural space and NPWD   SPECIMEN:  No Specimen  COUNTS CORRECT:  YES  PROCEDURE DETAILS: The patient was taken back to the operating room for hemodynamic instability and bleeding from her negative pressure wound dressing.  She was placed on the table in the supine position. She was intubated and sedated from the intensive care unit a proper timeout was performed. We removed the previous negative pressure wound dressing were large amount of blood excluded and fell off the field. We removed the inner portion and there was continued bleeding.  Once all pieces of the negative pressure wound dressing route we inspected the perineal cavity where there appeared to be some bleeding from the liver area however those packs were removed and we packed and found there to be minimal bleeding. A total of 3 packs were left around the liver on the right side. We inspected the small bowel and ran it from the ligament of Treitz down to the terminal ileum, saw the anastomosis without any evidence of any leakage or injury. Inspected the pelvis again and we packed the preperitoneal space with 3 packs of's lap taste. We irrigated with saline solution and subsequently closed with a negative pressure wound dressing again. All counts were correct including the number that were known to be left in perineal cavity totaling 6.  PATIENT DISPOSITION:   Return to the intensive care unit in critical condition.   Kristofor Michalowski 4/5/20179:45 PM

## 2016-02-11 NOTE — Anesthesia Preprocedure Evaluation (Signed)
Anesthesia Evaluation  Patient identified by MRN, date of birth, ID band  Reviewed: Unable to perform ROS - Chart review onlyPreop documentation limited or incomplete due to emergent nature of procedure.  Airway Mallampati: Intubated       Dental   Pulmonary     + decreased breath sounds (Decreased on Right, chest tube planned.)      Cardiovascular  Rhythm:Regular Rate:Normal     Neuro/Psych    GI/Hepatic   Endo/Other  Morbid obesity  Renal/GU      Musculoskeletal   Abdominal   Peds  Hematology   Anesthesia Other Findings Pt returned to OR for intraabdominal bleeding.  Intubated previously and brought to OR on vent.  Reproductive/Obstetrics                             Anesthesia Physical Anesthesia Plan  ASA: V and emergent  Anesthesia Plan: General   Post-op Pain Management:    Induction: Intravenous  Airway Management Planned:   Additional Equipment:   Intra-op Plan:   Post-operative Plan: Post-operative intubation/ventilation  Informed Consent:   History available from chart only and Only emergency history available  Plan Discussed with: CRNA, Anesthesiologist and Surgeon  Anesthesia Plan Comments: (Judicious following of labs and blood, FFP, platelets as needed.)        Anesthesia Quick Evaluation

## 2016-02-11 NOTE — Progress Notes (Signed)
Follow up - Trauma and Critical Care  Patient Details:    [name] Bell is an 35 y.o. female.  Lines/tubes : Airway 7.5 mm (Active)  Secured at (cm) 25 cm 02/11/2016  3:16 AM  Measured From Lips 02/11/2016  3:16 AM  Secured Location Right 02/11/2016  3:16 AM  Secured By [employer] 02/11/2016  3:16 AM  Tube Holder Repositioned Yes 02/11/2016  3:16 AM  Cuff Pressure (cm H2O) 24 cm H2O 02/11/2016  3:16 AM  Site Condition Dry 02/11/2016  3:16 AM     CVC Triple Lumen 02/09/16 Right Femoral (Active)  Indication for Insertion or Continuance of Line Vasoactive infusions 02/11/2016  7:48 AM  Site Assessment Clean;Intact;Dry 02/10/2016  9:00 PM  Proximal Lumen Status Infusing 02/10/2016  9:00 PM  Medial Infusing 02/10/2016  9:00 PM  Distal Lumen Status Infusing 02/10/2016  9:00 PM  Dressing Type Transparent;Occlusive;Gauze 02/10/2016  9:00 PM  Dressing Status Clean;Dry;Intact 02/10/2016  9:00 PM  Line Care Tubing changed 02/10/2016  5:00 AM  Dressing Intervention New dressing 02/10/2016  9:00 PM  Dressing Change Due 02/17/16 02/10/2016  9:00 PM     Arterial Line 02/09/16 Right Radial (Active)  Site Assessment Clean;Dry;Intact 02/10/2016  9:00 PM  Line Status Pulsatile blood flow 02/10/2016  9:00 PM  Art Line Waveform Appropriate 02/10/2016  9:00 PM  Art Line Interventions Zeroed and calibrated 02/10/2016  9:00 PM  Color/Movement/Sensation Capillary refill less than 3 sec 02/10/2016  9:00 PM  Dressing Type Transparent 02/10/2016  9:00 PM  Dressing Status Clean;Intact;Dry 02/10/2016  9:00 PM     Chest Tube 1 Right Pleural 32 Fr. (Active)  Suction -20 cm H2O 02/10/2016 11:13 PM  Chest Tube Air Leak None 02/10/2016 11:13 PM  Patency Intervention Milked;Tip/tilt 02/10/2016  8:00 AM  Drainage Description Bright red 02/10/2016 11:13 PM  Dressing Status Clean;Dry;Intact 02/10/2016 11:13 PM  Dressing Intervention Dressing reinforced 02/10/2016  8:00 AM  Site Assessment Clean;Intact;Dry 02/10/2016 11:13 PM  Surrounding Skin Unable to view  02/10/2016  9:00 PM  Output (mL) 505 mL 02/11/2016  6:00 AM     Negative Pressure Wound Therapy Abdomen Medial (Active)  Output (mL) 500 mL 02/11/2016  7:00 AM     NG/OG Tube Orogastric (Active)  Placement Verification Auscultation 02/10/2016  9:00 PM  Site Assessment Clean;Dry;Intact 02/10/2016  9:00 PM  Status Suction-low intermittent 02/10/2016  9:00 PM  Amount of suction 110 mmHg 02/10/2016  9:00 PM  Drainage Appearance Bile;Green 02/10/2016  9:00 PM  Output (mL) 300 mL 02/10/2016  6:00 PM     NG/OG Tube Orogastric 16 Fr. Center mouth (Active)     Urethral Catheter S. Bowman,RN Latex 16 Fr. (Active)  Indication for Insertion or Continuance of Catheter Unstable critical patients (first 24-48 hours);Other (comment) 02/11/2016  7:48 AM  Site Assessment Clean;Intact 02/10/2016  8:00 AM  Catheter Maintenance Bag below level of bladder;Insertion date on drainage bag;Bag emptied prior to transport;Catheter secured;Drainage bag/tubing not touching floor;No dependent loops;Seal intact 02/11/2016  7:48 AM  Collection Container Standard drainage bag 02/10/2016  8:00 AM  Securement Method Securing device (Describe) 02/10/2016  8:00 AM  Urinary Catheter Interventions Unclamped 02/10/2016  8:00 AM  Output (mL) 175 mL 02/11/2016  6:00 AM    Microbiology/Sepsis markers: No results found for this or any previous visit.  Anti-infectives:  Anti-infectives    Start     Dose/Rate Route Frequency Ordered Stop   02/11/16 0600  piperacillin-tazobactam (ZOSYN) IVPB 3.375 g     3.375  g 100 mL/hr over 30 Minutes Intravenous 4 times per day 02/10/16 2214     02/10/16 1730  piperacillin-tazobactam (ZOSYN) IVPB 3.375 g  Status:  Discontinued     3.375 g 12.5 mL/hr over 240 Minutes Intravenous 3 times per day 02/10/16 1727 02/10/16 2214      Best Practice/Protocols:  VTE Prophylaxis: Mechanical GI Prophylaxis: Proton Pump Inhibitor Continous Sedation  Consults: Treatment Team:  Myrene Galas, MD Charlie Pitter III, MD     Events:  Subjective:    Overnight Issues: Multiple pressors with continued hypotension and shock.  Making urine, awake and alert.  Following commands.  Objective:  Vital signs for last 24 hours: Temp:  [97.5 F (36.4 C)-99.9 F (37.7 C)] 97.5 F (36.4 C) (04/05 0400) Pulse Rate:  [100-135] 119 (04/05 0700) Resp:  [0-38] 24 (04/05 0700) BP: (68-128)/(26-97) 87/56 mmHg (04/05 0700) SpO2:  [71 %-100 %] 100 % (04/05 0700) Arterial Line BP: (28-160)/(23-61) 126/33 mmHg (04/05 0700) FiO2 (%):  [80 %-100 %] 90 % (04/05 0316)  Hemodynamic parameters for last 24 hours: CVP:  [13 mmHg-23 mmHg] 16 mmHg  Intake/Output from previous day: 04/04 0701 - 04/05 0700 In: 14101.7 [I.V.:10576.7; Blood:3055; IV Piggyback:470] Out: 6230 [Urine:1725; Emesis/NG output:300; Drains:3500; Blood:200; Chest Tube:505]  Intake/Output this shift:    Vent settings for last 24 hours: Vent Mode:  [-] PRVC FiO2 (%):  [80 %-100 %] 90 % Set Rate:  [30 bmp] 30 bmp Vt Set:  [490 mL] 490 mL PEEP:  [10 cmH20-12 cmH20] 12 cmH20 Plateau Pressure:  [26 cmH20-29 cmH20] 29 cmH20  Physical Exam:  General: alert, no respiratory distress and following commands on multiple drips. Neuro: alert, oriented, nonfocal exam and RASS 0 Resp: diminished breath sounds RLL, RML and RUL CVS: Sinus tachycardia. GI: Open abdomen without bowel sounds.  Starting to bubble blood at the edge of the open wound.  VAC still intact.  Draining a lot of bloody fluid Extremities: edema 4+, pulses doppler,  and Able to get Dopplers at the right PT and the left DP  Results for orders placed or performed during the hospital encounter of 02/09/16 (from the past 24 hour(s))  Prepare RBC     Status: None   Collection Time: 02/10/16  9:04 AM  Result Value Ref Range   Order Confirmation ORDER PROCESSED BY BLOOD BANK   Prepare fresh frozen plasma     Status: None (Preliminary result)   Collection Time: 02/10/16  9:05 AM  Result Value Ref  Range   Unit Number [id number]    Blood Component Type THW PLS APHR    Unit division 00    Status of Unit ALLOCATED    Transfusion Status OK TO TRANSFUSE    Unit Number [id number]    Blood Component Type THWPLS APHR2    Unit division 00    Status of Unit ALLOCATED    Transfusion Status OK TO TRANSFUSE   Blood gas, arterial     Status: Abnormal   Collection Time: 02/10/16 11:20 AM  Result Value Ref Range   FIO2 0.40    Delivery systems VENTILATOR    Mode PRESSURE REGULATED VOLUME CONTROL    VT 0.490 mL   LHR 30 resp/min   Peep/cpap 10.0 cm H20   pH, Arterial 7.237 (L) 7.350 - 7.450   pCO2 arterial 34.9 (L) 35.0 - 45.0 mmHg   pO2, Arterial 79.2 (L) 80.0 - 100.0 mmHg   Bicarbonate 14.3 (L) 20.0 - 24.0 mEq/L   TCO2  15.4 0 - 100 mmol/L   Acid-base deficit 11.6 (H) 0.0 - 2.0 mmol/L   O2 Saturation 95.1 %   Patient temperature 98.6    Collection site A-LINE    Drawn by [phone]    Sample type ARTERIAL DRAW   Hemoglobin and hematocrit, blood     Status: Abnormal   Collection Time: 02/10/16  3:17 PM  Result Value Ref Range   Hemoglobin 8.0 (L) 12.0 - 15.0 g/dL   HCT 21.3 (L) 08.6 - 57.8 %  Prepare RBC     Status: None   Collection Time: 02/10/16  4:00 PM  Result Value Ref Range   Order Confirmation ORDER PROCESSED BY BLOOD BANK   CBC     Status: Abnormal   Collection Time: 02/10/16  4:15 PM  Result Value Ref Range   WBC 14.1 (H) 4.0 - 10.5 K/uL   RBC 2.67 (L) 3.87 - 5.11 MIL/uL   Hemoglobin 7.9 (L) 12.0 - 15.0 g/dL   HCT 46.9 (L) 62.9 - 52.8 %   MCV 84.6 78.0 - 100.0 fL   MCH 29.6 26.0 - 34.0 pg   MCHC 35.0 30.0 - 36.0 g/dL   RDW 41.3 (H) 24.4 - 01.0 %   Platelets 96 (L) 150 - 400 K/uL  Acetaminophen level     Status: Abnormal   Collection Time: 02/10/16  4:15 PM  Result Value Ref Range   Acetaminophen (Tylenol), Serum <10 (L) 10 - 30 ug/mL  Hepatic function panel     Status: Abnormal   Collection Time: 02/10/16  4:15 PM  Result Value Ref Range   Total Protein  4.1 (L) 6.5 - 8.1 g/dL   Albumin 2.4 (L) 3.5 - 5.0 g/dL   AST 272 (H) 15 - 41 U/L   ALT 566 (H) 14 - 54 U/L   Alkaline Phosphatase 41 38 - 126 U/L   Total Bilirubin 1.7 (H) 0.3 - 1.2 mg/dL   Bilirubin, Direct 1.0 (H) 0.1 - 0.5 mg/dL   Indirect Bilirubin 0.7 0.3 - 0.9 mg/dL  Blood gas, arterial     Status: Abnormal   Collection Time: 02/10/16  4:28 PM  Result Value Ref Range   FIO2 0.80    Delivery systems VENTILATOR    Mode PRESSURE REGULATED VOLUME CONTROL    VT 490 mL   LHR 30 resp/min   Peep/cpap 12.0 cm H20   pH, Arterial 7.172 (LL) 7.350 - 7.450   pCO2 arterial 35.8 35.0 - 45.0 mmHg   pO2, Arterial 74.7 (L) 80.0 - 100.0 mmHg   Bicarbonate 12.6 (L) 20.0 - 24.0 mEq/L   TCO2 13.7 0 - 100 mmol/L   Acid-base deficit 14.2 (H) 0.0 - 2.0 mmol/L   O2 Saturation 93.6 %   Patient temperature 98.6    Collection site A-LINE    Drawn by [phone]    Sample type ARTERIAL DRAW    Allens test (pass/fail) PASS PASS  Lactic acid, plasma     Status: Abnormal   Collection Time: 02/10/16  4:59 PM  Result Value Ref Range   Lactic Acid, Venous 10.0 (HH) 0.5 - 2.0 mmol/L  I-STAT 7, (LYTES, BLD GAS, ICA, H+H)     Status: Abnormal   Collection Time: 02/10/16  6:29 PM  Result Value Ref Range   pH, Arterial 7.121 (LL) 7.350 - 7.450   pCO2 arterial 40.4 35.0 - 45.0 mmHg   pO2, Arterial 57.0 (L) 80.0 - 100.0 mmHg   Bicarbonate 13.2 (L) 20.0 - 24.0 mEq/L  TCO2 14 0 - 100 mmol/L   O2 Saturation 80.0 %   Acid-base deficit 15.0 (H) 0.0 - 2.0 mmol/L   Sodium 143 135 - 145 mmol/L   Potassium 4.2 3.5 - 5.1 mmol/L   Calcium, Ion 0.86 (L) 1.12 - 1.23 mmol/L   HCT 21.0 (L) 36.0 - 46.0 %   Hemoglobin 7.1 (L) 12.0 - 15.0 g/dL   Patient temperature 16.136.5 C    Sample type ARTERIAL   Prepare RBC     Status: None   Collection Time: 02/10/16  7:59 PM  Result Value Ref Range   Order Confirmation ORDER PROCESSED BY BLOOD BANK   CBC     Status: Abnormal   Collection Time: 02/10/16  8:21 PM  Result Value Ref  Range   WBC 13.3 (H) 4.0 - 10.5 K/uL   RBC 2.77 (L) 3.87 - 5.11 MIL/uL   Hemoglobin 8.2 (L) 12.0 - 15.0 g/dL   HCT 09.623.8 (L) 04.536.0 - 40.946.0 %   MCV 85.9 78.0 - 100.0 fL   MCH 29.6 26.0 - 34.0 pg   MCHC 34.5 30.0 - 36.0 g/dL   RDW 81.115.9 (H) 91.411.5 - 78.215.5 %   Platelets 60 (L) 150 - 400 K/uL  Basic metabolic panel     Status: Abnormal   Collection Time: 02/10/16  8:21 PM  Result Value Ref Range   Sodium 146 (H) 135 - 145 mmol/L   Potassium 4.0 3.5 - 5.1 mmol/L   Chloride 112 (H) 101 - 111 mmol/L   CO2 13 (L) 22 - 32 mmol/L   Glucose, Bld 73 65 - 99 mg/dL   BUN 12 6 - 20 mg/dL   Creatinine, Ser 9.561.77 (H) 0.44 - 1.00 mg/dL   Calcium 5.9 (LL) 8.9 - 10.3 mg/dL   GFR calc non Af Amer 37 (L) >60 mL/min   GFR calc Af Amer 43 (L) >60 mL/min   Anion gap 21 (H) 5 - 15  Blood gas, arterial     Status: Abnormal   Collection Time: 02/10/16  8:30 PM  Result Value Ref Range   FIO2 1.00    Delivery systems VENTILATOR    Mode PRESSURE REGULATED VOLUME CONTROL    VT 490 mL   LHR 30 resp/min   Peep/cpap 12.0 cm H20   pH, Arterial 7.191 (LL) 7.350 - 7.450   pCO2 arterial 34.5 (L) 35.0 - 45.0 mmHg   pO2, Arterial 100 80.0 - 100.0 mmHg   Bicarbonate 12.7 (L) 20.0 - 24.0 mEq/L   TCO2 13.8 0 - 100 mmol/L   Acid-base deficit 13.8 (H) 0.0 - 2.0 mmol/L   O2 Saturation 97.2 %   Patient temperature 98.6    Collection site A-LINE    Drawn by [phone]    Sample type ARTERIAL   Prepare Pheresed Platelets     Status: None (Preliminary result)   Collection Time: 02/10/16  9:18 PM  Result Value Ref Range   Unit Number [id number]    Blood Component Type PLTP LR1 PAS    Unit division 00    Status of Unit ISSUED    Transfusion Status OK TO TRANSFUSE    Unit Number [id number]    Blood Component Type PLTPHER LR1    Unit division 00    Status of Unit ISSUED    Transfusion Status OK TO TRANSFUSE   CBC with Differential/Platelet     Status: Abnormal   Collection Time: 02/11/16 12:30 AM  Result Value Ref  Range   WBC  17.8 (H) 4.0 - 10.5 K/uL   RBC 2.97 (L) 3.87 - 5.11 MIL/uL   Hemoglobin 8.9 (L) 12.0 - 15.0 g/dL   HCT 16.1 (L) 09.6 - 04.5 %   MCV 86.5 78.0 - 100.0 fL   MCH 30.0 26.0 - 34.0 pg   MCHC 34.6 30.0 - 36.0 g/dL   RDW 40.9 81.1 - 91.4 %   Platelets 138 (L) 150 - 400 K/uL   Neutrophils Relative % 87 %   Lymphocytes Relative 7 %   Monocytes Relative 6 %   Eosinophils Relative 0 %   Basophils Relative 0 %   Neutro Abs 15.5 (H) 1.7 - 7.7 K/uL   Lymphs Abs 1.2 0.7 - 4.0 K/uL   Monocytes Absolute 1.1 (H) 0.1 - 1.0 K/uL   Eosinophils Absolute 0.0 0.0 - 0.7 K/uL   Basophils Absolute 0.0 0.0 - 0.1 K/uL   RBC Morphology RARE NRBCs    WBC Morphology INCREASED BANDS (>20% BANDS)   Blood gas, arterial     Status: Abnormal   Collection Time: 02/11/16  3:55 AM  Result Value Ref Range   VT 490 mL   LHR 30 resp/min   Peep/cpap 12.0 cm H20   pH, Arterial 7.077 (LL) 7.350 - 7.450   pCO2 arterial 33.7 (L) 35.0 - 45.0 mmHg   pO2, Arterial 79.5 (L) 80.0 - 100.0 mmHg   Bicarbonate 9.6 (L) 20.0 - 24.0 mEq/L   TCO2 10.6 0 - 100 mmol/L   Acid-base deficit 18.5 (H) 0.0 - 2.0 mmol/L   O2 Saturation 93.9 %   Patient temperature 97.5    Collection site A-LINE    Drawn by [id number]    Allens test (pass/fail) PASS PASS  CBC     Status: Abnormal   Collection Time: 02/11/16  4:43 AM  Result Value Ref Range   WBC 20.3 (H) 4.0 - 10.5 K/uL   RBC 2.49 (L) 3.87 - 5.11 MIL/uL   Hemoglobin 7.4 (L) 12.0 - 15.0 g/dL   HCT 62.1 (L) 30.8 - 65.7 %   MCV 86.3 78.0 - 100.0 fL   MCH 29.7 26.0 - 34.0 pg   MCHC 34.4 30.0 - 36.0 g/dL   RDW 84.6 (H) 96.2 - 95.2 %   Platelets 124 (L) 150 - 400 K/uL  Basic metabolic panel     Status: Abnormal   Collection Time: 02/11/16  4:43 AM  Result Value Ref Range   Sodium 144 135 - 145 mmol/L   Potassium 4.1 3.5 - 5.1 mmol/L   Chloride 109 101 - 111 mmol/L   CO2 10 (L) 22 - 32 mmol/L   Glucose, Bld 173 (H) 65 - 99 mg/dL   BUN 13 6 - 20 mg/dL   Creatinine, Ser 8.41 (H)  0.44 - 1.00 mg/dL   Calcium 5.9 (LL) 8.9 - 10.3 mg/dL   GFR calc non Af Amer 34 (L) >60 mL/min   GFR calc Af Amer 39 (L) >60 mL/min   Anion gap 25 (H) 5 - 15  Hepatic function panel     Status: Abnormal   Collection Time: 02/11/16  4:43 AM  Result Value Ref Range   Total Protein 3.3 (L) 6.5 - 8.1 g/dL   Albumin 1.9 (L) 3.5 - 5.0 g/dL   AST 3244 (H) 15 - 41 U/L   ALT 768 (H) 14 - 54 U/L   Alkaline Phosphatase 41 38 - 126 U/L   Total Bilirubin 2.6 (H) 0.3 - 1.2 mg/dL   Bilirubin, Direct  1.4 (H) 0.1 - 0.5 mg/dL   Indirect Bilirubin 1.2 (H) 0.3 - 0.9 mg/dL     Assessment/Plan:   NEURO  Altered Mental Status:  agitation and but alert and following commands.   Plan: CPM  PULM  Atelectasis/collapse (focal and mostly the right lung) Hypoxemia (PaO2/FIO2 < 200) Chest Wall Trauma with multiple right rib fractures and Lung Trauma (right, with contusion of lung, with laceration of lung and with h emothorax)   Plan: CXR today shows near complete opacification of the right lung field.  CARDIO  Sinus Tachycardia   Plan: No specific treatment necessary  RENAL  Actue Renal Failure (due to renal ischemia) output is good though.   Plan: Continue current management, monitor urine output as we fight shock with pressors and volume.  CVP 16.  GI  Bowel Trauma with open abdominal NPWD, Hepatic Trauma and Renal Trauma open pelvic fracture and major liver laceration   Plan: No tube feedings for now.  ID  No known infectious problems   Plan: CPM  HEME  Anemia acute blood loss anemia) Coagulopathy (dilutional and severe) Thrombocytopenia (severe)   Plan: Giving one unit of blood, checkin coagulation panel.  ENDO No known problems.   Plan: CPM  Global Issues  Patient continues to be hypotensive on multiple pressors, CVP 16, UO good, pain and hypotension.  Increased right hemothorax on CXR today.  Will need another right sided chest tube.  One unit of blood given.ABG done and still very acidotic  with PaCO2 30. Possible ingestion of something increasing her level of shock that is refractory to pressors right now.    LOS: 2 days   Additional comments:I reviewed the patient's new clinical lab test results. cbc/bmet/INR/fibrinogen, I reviewed the patients new imaging test results. CXR and I reviewed the patient's other test results. ABG  Critical Care Total Time*: 1 Hour 30 Minutes  Maxton Noreen 02/11/2016  *Care during the described time interval was provided by me and/or other providers on the critical care team.  I have reviewed this patient's available data, including medical history, events of note, physical examination and test results as part of my evaluation.

## 2016-02-11 NOTE — Progress Notes (Signed)
   02/11/16 1526  Chest Tube 1 Right Pleural 32 Fr.  Placement Date/Time: 02/09/16 0846   Person Inserting Catheter: Dr. Duffy RhodyWyatt  Tube Number: 1  Chest Tube Orientation: Right  Chest Tube Location: Pleural  Chest Tube Type: Straight  Size (Fr.): 32 Fr.  Chest Tube Drainage System: Suction  Suction -20 cm H2O  Chest Tube Air Leak None  Drainage Description Bright red;Other (Comment) (clotting/MD/N)  Incision (Closed) 02/10/16 Abdomen  Date First Assessed/Time First Assessed: 02/10/16 1916   Location: Abdomen  Dressing Type Negative pressure wound therapy  Dressing Other (Comment) (clotted)  Site / Wound Assessment Other (Comment) (hemotoma formed around drainage sponge)  MD/[name] updated, pt VSS, nursing will cont to monitor

## 2016-02-11 NOTE — Progress Notes (Signed)
Patient is bleeding out again.  NPWD dysfunction.  Likely clot underneath the dressing.  Needs a second right chest tube.  This is a last stitch heroic effort to stop this life-threatening death spiral.  Chance of death are high.  She is on PEEP +15.  FIO2 100%.  BP a bit more stable with the blood products given.  Going back to the OR for washout, repacking and NPWD closure.  Marta LamasJames O. Gae BonWyatt, III, MD, FACS [phone] Trauma Surgeon

## 2016-02-11 NOTE — Progress Notes (Signed)
[facility] Progress Note  Subjective  Pt seen with Dr. Marcelino Scot Events overnight and this morning noted Remains on vent but awake  ROS As above  Objective   BP 91/39 mmHg  Pulse 120  Temp(Src) 96.3 F (35.7 C) (Axillary)  Resp 30  Ht _0  (1.803 m)  Wt 127.9 kg (281 lb 15.5 oz)  BMI 39.34 kg/m2  SpO2 96%  LMP  (LMP Unknown)  Intake/Output      04/04 0701 - 04/05 0700 04/05 0701 - 04/06 0700   I.V. (mL/kg) 10576.7 (82.7)    Blood 3055 30   IV Piggyback 470    Total Intake(mL/kg) 14101.7 (110.3) 30 (0.2)   Urine (mL/kg/hr) 1725 (0.6) 200 (0.6)   Emesis/NG output 300 (0.1)    Drains 3500 (1.1)    Blood 200 (0.1)    Chest Tube 505 (0.2)    Total Output 6230 200   Net +7871.7 -170          Labs Results for JASELYNN, TAMAS (MRN [id number]) as of 02/11/2016 09:42  Ref. Range 02/11/2016 03:55 02/11/2016 04:43  Sample type Unknown ARTERIAL DRAW   Delivery systems Unknown VENTILATOR   FIO2 Unknown 0.90   Mode Unknown PRESSURE REGULATE...   VT Latest Units: mL 490   Peep/cpap Latest Units: cm H20 12.0   pH, Arterial Latest Ref Range: 7.350-7.450  7.077 (LL)   pCO2 arterial Latest Ref Range: 35.0-45.0 mmHg 33.7 (L)   pO2, Arterial Latest Ref Range: 80.0-100.0 mmHg 79.5 (L)   Bicarbonate Latest Ref Range: 20.0-24.0 mEq/L 9.6 (L)   TCO2 Latest Ref Range: 0-100 mmol/L 10.6   Acid-base deficit Latest Ref Range: 0.0-2.0 mmol/L 18.5 (H)   O2 Saturation Latest Units: % 93.9   Patient temperature Unknown 97.5   Collection site Unknown A-LINE   Sodium Latest Ref Range: 135-145 mmol/L  144  Potassium Latest Ref Range: 3.5-5.1 mmol/L  4.1  Chloride Latest Ref Range: 101-111 mmol/L  109  CO2 Latest Ref Range: 22-32 mmol/L  10 (L)  BUN Latest Ref Range: 6-20 mg/dL  13  Creatinine Latest Ref Range: 0.44-1.00 mg/dL  1.91 (H)  Calcium Latest Ref Range: 8.9-10.3 mg/dL  5.9 (LL)  EGFR (Non-African Amer.) Latest Ref Range: >60 mL/min  34 (L)  EGFR (African American)  Latest Ref Range: >60 mL/min  39 (L)  Glucose Latest Ref Range: 65-99 mg/dL  173 (H)  Anion gap Latest Ref Range: 5-15   25 (H)  Alkaline Phosphatase Latest Ref Range: 38-126 U/L  41  Albumin Latest Ref Range: 3.5-5.0 g/dL  1.9 (L)  AST Latest Ref Range: 15-41 U/L  1300 (H)  ALT Latest Ref Range: 14-54 U/L  768 (H)  Total Protein Latest Ref Range: 6.5-8.1 g/dL  3.3 (L)  Bilirubin, Direct Latest Ref Range: 0.1-0.5 mg/dL  1.4 (H)  Indirect Bilirubin Latest Ref Range: 0.3-0.9 mg/dL  1.2 (H)  Total Bilirubin Latest Ref Range: 0.3-1.2 mg/dL  2.6 (H)  WBC Latest Ref Range: 4.0-10.5 K/uL  20.3 (H)  RBC Latest Ref Range: 3.87-5.11 MIL/uL  2.49 (L)  Hemoglobin Latest Ref Range: 12.0-15.0 g/dL  7.4 (L)  HCT Latest Ref Range: 36.0-46.0 %  21.5 (L)  MCV Latest Ref Range: 78.0-100.0 fL  86.3  MCH Latest Ref Range: 26.0-34.0 pg  29.7  MCHC Latest Ref Range: 30.0-36.0 g/dL  34.4  RDW Latest Ref Range: 11.5-15.5 %  15.7 (H)  Platelets Latest Ref Range: 150-400 K/uL  124 (L)   Exam  Gen:  vent  Abd: Wound VAC  Pelvis: ex fix stable, dressing with serous drainage (changed yesterday evening) Ext:       Left upper extremity   Removable brace in place  + swelling  Moves digits       B Lower Extremities  Cool   Moves toes   Legs abducted   Repositioned legs to adduct to help with pelvis    Assessment and Plan   POD/HD#: 2   -H type sacral fracture and pelvic ring disruption s/p Ex fix  Possible return to OR tomorrow or Friday for B SI screws and symphyseal plating if swelling allows  Will check in with Trauma team this afternoon regarding timing for return to OR as pt appears to have severe metabolic component to her persistent hypotension/shock  Continue to maintain legs in adducted position to minimize stress on pelvis   - multiple carpal/metalcarpal fxs L hand  Hand service consulted  - Pain management:  Per ts  - ABL anemia/Hemodynamics  Receiving more product   - Dispo:  Per  TS     Jari Pigg, PA-C [facility] [phone] (P) [phone] (O) 02/11/2016 9:41 AM

## 2016-02-11 NOTE — Progress Notes (Deleted)
Patient transported to OR from 35M without any complications

## 2016-02-11 NOTE — OR Nursing (Signed)
Two 18x18 sponges were removed and six were placed in the abdomen

## 2016-02-11 NOTE — Progress Notes (Signed)
CRITICAL VALUE ALERT  Critical value received:  ABG- PH 7.0, Bicarb 9.6  Date of notification: 4/5  Time of notification:  400  Critical value read back:Yes.    Nurse who received alert:  Weston BrassNick, RT called Dr. Corliss Skainssuei  MD notified (1st page):  Dr. Corliss Skainssuei  Time of first page:  400  MD notified (2nd page):  Time of second page:  Responding MD:  Corliss Skainssuei  Time MD responded: 401  Bicarb gtt and Maintenance fluid rates changed

## 2016-02-11 NOTE — Anesthesia Postprocedure Evaluation (Signed)
Anesthesia Post Note  Patient: [name] Bell  Procedure(s) Performed: Procedure(s) (LRB): EXPLORATORY LAPAROTOMY VAC CHANGE  (N/A) CHEST TUBE INSERTION (Right)  Patient location during evaluation: ICU Anesthesia Type: General Level of consciousness: sedated and patient remains intubated per anesthesia plan Pain management: pain level controlled Vital Signs Assessment: post-procedure vital signs reviewed and stable Respiratory status: patient remains intubated per anesthesia plan and patient on ventilator - see flowsheet for VS Cardiovascular status: stable Anesthetic complications: no    Last Vitals:  Filed Vitals:   02/11/16 2153 02/11/16 2208  BP: 136/83   Pulse: 117   Temp:  36.3 C  Resp: 35     Last Pain:  Filed Vitals:   02/11/16 2209  PainSc: 9                  Harshini Trent A

## 2016-02-11 NOTE — Progress Notes (Addendum)
CRITICAL VALUE ALERT  Critical value received: Calcium 5.9/ PH 7.19  Date of notification:  4/4  Time of notification: 2045  Critical value read back:Yes.    Nurse who received alert:  Benna Dunksaryn   MD notified (1st page):  Tsuei  Time of first page: 2100  MD notified (2nd page):  Time of second page:  Responding MD:  Corliss Skainssuei  Time MD responded: 2100

## 2016-02-11 NOTE — Progress Notes (Signed)
I was preparing to place second right chest tube and was informed that INR 4.5, PT >40 sec.  Will correct coagulopathy before CT placed.  Will give Vit K also.  Marta LamasJames O. Gae BonWyatt, III, MD, FACS [phone] Trauma Surgeon

## 2016-02-11 NOTE — Progress Notes (Signed)
CRITICAL VALUE ALERT  Critical value received:  13.7 lactic acid  Date of notification:  02/11/16  Time of notification:  1411  Critical value read back: yes   Nurse who received alert:  Londell MohKaty Rachna Schonberger, RN  MD notified (1st page):  Dr. Lindie SpruceWyatt  Time of first page:   1411  MD notified (2nd page):  Time of second page:  Responding MD:  Dr. Lindie SpruceWyatt  Time MD responded:  [phone]

## 2016-02-11 NOTE — Progress Notes (Signed)
Dr. Corliss Skainssuei notified of patient's continued low BP despite bolus. Order received for one bottle 5% albumin. Will monitor.

## 2016-02-11 NOTE — Progress Notes (Signed)
CRITICAL VALUE ALERT  Critical value received:  HGB 4.7  Date of notification:  02/11/16  Time of notification:  1730  Critical value read back: yes  Nurse who received alert:  Londell MohKaty Jashon Ishida, RN  MD notified (1st page):  Dr. Lindie SpruceWyatt  Time of first page:  1731  MD notified (2nd page):  Time of second page:  Responding MD:  Dr. Lindie SpruceWyatt  Time MD responded:  [phone]

## 2016-02-12 LAB — PREPARE FRESH FROZEN PLASMA
UNIT DIVISION: 0
UNIT DIVISION: 0
UNIT DIVISION: 0
Unit division: 0
Unit division: 0
Unit division: 0
Unit division: 0
Unit division: 0
Unit division: 0
Unit division: 0
Unit division: 0
Unit division: 0
Unit division: 0
Unit division: 0
Unit division: 0
Unit division: 0
Unit division: 0
Unit division: 0
Unit division: 0
Unit division: 0
Unit division: 0
Unit division: 0
Unit division: 0
Unit division: 0
Unit division: 0
Unit division: 0
Unit division: 0
Unit division: 0
Unit division: 0
Unit division: 0
Unit division: 0
Unit division: 0
Unit division: 0
Unit division: 0
Unit division: 0
Unit division: 0
Unit division: 0
Unit division: 0
Unit division: 0
Unit division: 0
Unit division: 0
Unit division: 0
Unit division: 0
Unit division: 0
Unit division: 0
Unit division: 0
Unit division: 0
Unit division: 0

## 2016-02-12 LAB — BASIC METABOLIC PANEL
Anion gap: 17 — ABNORMAL HIGH (ref 5–15)
BUN: 16 mg/dL (ref 6–20)
CHLORIDE: 99 mmol/L — AB (ref 101–111)
CO2: 20 mmol/L — AB (ref 22–32)
Calcium: 5.6 mg/dL — CL (ref 8.9–10.3)
Creatinine, Ser: 2.02 mg/dL — ABNORMAL HIGH (ref 0.44–1.00)
GFR calc Af Amer: 37 mL/min — ABNORMAL LOW (ref >60)
GFR calc non Af Amer: 32 mL/min — ABNORMAL LOW (ref >60)
GLUCOSE: 241 mg/dL — AB (ref 65–99)
POTASSIUM: 3.3 mmol/L — AB (ref 3.5–5.1)
Sodium: 136 mmol/L (ref 135–145)

## 2016-02-12 LAB — COMPREHENSIVE METABOLIC PANEL
ALT: 617 U/L — ABNORMAL HIGH (ref 14–54)
ANION GAP: 15 (ref 5–15)
AST: 1359 U/L — ABNORMAL HIGH (ref 15–41)
Albumin: 2 g/dL — ABNORMAL LOW (ref 3.5–5.0)
Alkaline Phosphatase: 59 U/L (ref 38–126)
BILIRUBIN TOTAL: 2.8 mg/dL — AB (ref 0.3–1.2)
BUN: 15 mg/dL (ref 6–20)
CALCIUM: 5.7 mg/dL — AB (ref 8.9–10.3)
CO2: 21 mmol/L — ABNORMAL LOW (ref 22–32)
Chloride: 103 mmol/L (ref 101–111)
Creatinine, Ser: 1.92 mg/dL — ABNORMAL HIGH (ref 0.44–1.00)
GFR, EST AFRICAN AMERICAN: 39 mL/min — AB (ref >60)
GFR, EST NON AFRICAN AMERICAN: 33 mL/min — AB (ref >60)
Glucose, Bld: 227 mg/dL — ABNORMAL HIGH (ref 65–99)
POTASSIUM: 3.2 mmol/L — AB (ref 3.5–5.1)
Sodium: 139 mmol/L (ref 135–145)
TOTAL PROTEIN: 3.6 g/dL — AB (ref 6.5–8.1)

## 2016-02-12 LAB — CBC WITH DIFFERENTIAL/PLATELET
BASOS ABS: 0 10*3/uL (ref 0.0–0.1)
BASOS PCT: 0 %
EOS ABS: 0.1 10*3/uL (ref 0.0–0.7)
Eosinophils Relative: 1 %
HCT: 19.1 % — ABNORMAL LOW (ref 36.0–46.0)
Hemoglobin: 6.6 g/dL — CL (ref 12.0–15.0)
LYMPHS ABS: 0.9 10*3/uL (ref 0.7–4.0)
LYMPHS PCT: 7 %
MCH: 29.2 pg (ref 26.0–34.0)
MCHC: 34.6 g/dL (ref 30.0–36.0)
MCV: 84.5 fL (ref 78.0–100.0)
MONO ABS: 0.4 10*3/uL (ref 0.1–1.0)
Monocytes Relative: 3 %
NEUTROS ABS: 11.5 10*3/uL — AB (ref 1.7–7.7)
Neutrophils Relative %: 89 %
PLATELETS: 37 10*3/uL — AB (ref 150–400)
RBC: 2.26 MIL/uL — ABNORMAL LOW (ref 3.87–5.11)
RDW: 14.9 % (ref 11.5–15.5)
WBC: 12.9 10*3/uL — ABNORMAL HIGH (ref 4.0–10.5)

## 2016-02-12 LAB — BLOOD GAS, ARTERIAL
Acid-base deficit: 3.1 mmol/L — ABNORMAL HIGH (ref 0.0–2.0)
BICARBONATE: 21.1 meq/L (ref 20.0–24.0)
Drawn by: 313941
FIO2: 0.8
LHR: 35 {breaths}/min
O2 Saturation: 99.6 %
PATIENT TEMPERATURE: 97.4
PEEP/CPAP: 15 cmH2O
TCO2: 22.2 mmol/L (ref 0–100)
VT: 430 mL
pCO2 arterial: 34.7 mmHg — ABNORMAL LOW (ref 35.0–45.0)
pH, Arterial: 7.398 (ref 7.350–7.450)
pO2, Arterial: 195 mmHg — ABNORMAL HIGH (ref 80.0–100.0)

## 2016-02-12 LAB — POCT I-STAT 7, (LYTES, BLD GAS, ICA,H+H)
ACID-BASE DEFICIT: 10 mmol/L — AB (ref 0.0–2.0)
ACID-BASE DEFICIT: 13 mmol/L — AB (ref 0.0–2.0)
BICARBONATE: 14.1 meq/L — AB (ref 20.0–24.0)
Bicarbonate: 16.4 mEq/L — ABNORMAL LOW (ref 20.0–24.0)
CALCIUM ION: 0.82 mmol/L — AB (ref 1.12–1.23)
Calcium, Ion: 0.77 mmol/L — ABNORMAL LOW (ref 1.12–1.23)
HCT: 20 % — ABNORMAL LOW (ref 36.0–46.0)
HEMATOCRIT: 17 % — AB (ref 36.0–46.0)
Hemoglobin: 5.8 g/dL — CL (ref 12.0–15.0)
Hemoglobin: 6.8 g/dL — CL (ref 12.0–15.0)
O2 SAT: 85 %
O2 SAT: 92 %
PCO2 ART: 36.2 mmHg (ref 35.0–45.0)
PH ART: 7.199 — AB (ref 7.350–7.450)
PH ART: 7.21 — AB (ref 7.350–7.450)
PO2 ART: 78 mmHg — AB (ref 80.0–100.0)
Potassium: 4.4 mmol/L (ref 3.5–5.1)
Potassium: 4.7 mmol/L (ref 3.5–5.1)
SODIUM: 145 mmol/L (ref 135–145)
Sodium: 144 mmol/L (ref 135–145)
TCO2: 15 mmol/L (ref 0–100)
TCO2: 18 mmol/L (ref 0–100)
pCO2 arterial: 41.1 mmHg (ref 35.0–45.0)
pO2, Arterial: 60 mmHg — ABNORMAL LOW (ref 80.0–100.0)

## 2016-02-12 LAB — PREPARE PLATELET PHERESIS: Unit division: 0

## 2016-02-12 LAB — PROTIME-INR
INR: 2.27 — ABNORMAL HIGH (ref 0.00–1.49)
INR: 2.16 — ABNORMAL HIGH (ref 0.00–1.49)
PROTHROMBIN TIME: 24.8 s — AB (ref 11.6–15.2)
Prothrombin Time: 23.9 seconds — ABNORMAL HIGH (ref 11.6–15.2)

## 2016-02-12 LAB — PREPARE RBC (CROSSMATCH)

## 2016-02-12 LAB — HEMOGLOBIN AND HEMATOCRIT, BLOOD
HEMATOCRIT: 20.9 % — AB (ref 36.0–46.0)
HEMATOCRIT: 17.6 % — AB (ref 36.0–46.0)
HEMOGLOBIN: 7.2 g/dL — AB (ref 12.0–15.0)
HEMOGLOBIN: 6.2 g/dL — AB (ref 12.0–15.0)

## 2016-02-12 LAB — CBC
HEMATOCRIT: 16.9 % — AB (ref 36.0–46.0)
Hemoglobin: 5.9 g/dL — CL (ref 12.0–15.0)
MCH: 29.5 pg (ref 26.0–34.0)
MCHC: 34.9 g/dL (ref 30.0–36.0)
MCV: 84.5 fL (ref 78.0–100.0)
PLATELETS: 48 10*3/uL — AB (ref 150–400)
RBC: 2 MIL/uL — ABNORMAL LOW (ref 3.87–5.11)
RDW: 15.1 % (ref 11.5–15.5)
WBC: 13.9 10*3/uL — AB (ref 4.0–10.5)

## 2016-02-12 MED ORDER — ALBUMIN HUMAN 5 % IV SOLN
12.5000 g | Freq: Once | INTRAVENOUS | Status: AC
  Administered 2016-02-12: 12.5 g via INTRAVENOUS

## 2016-02-12 MED ORDER — SODIUM CHLORIDE 0.9 % IV SOLN
1.0000 g | Freq: Once | INTRAVENOUS | Status: AC
  Administered 2016-02-12: 1 g via INTRAVENOUS
  Filled 2016-02-12: qty 10

## 2016-02-12 MED ORDER — SODIUM CHLORIDE 0.9 % IV SOLN: Freq: Once | INTRAVENOUS | Status: AC

## 2016-02-12 MED ORDER — SODIUM CHLORIDE 0.9 % IV SOLN
2.0000 g | Freq: Once | INTRAVENOUS | Status: AC
  Administered 2016-02-12: 2 g via INTRAVENOUS
  Filled 2016-02-12: qty 20

## 2016-02-12 MED ORDER — SODIUM CHLORIDE 0.9 % IV SOLN: Freq: Once | INTRAVENOUS | Status: DC

## 2016-02-12 MED ORDER — ALBUMIN HUMAN 5 % IV SOLN
INTRAVENOUS | Status: AC
  Filled 2016-02-12: qty 250

## 2016-02-12 MED ORDER — SODIUM CHLORIDE 0.9 % IV SOLN
Freq: Once | INTRAVENOUS | Status: AC
Start: 2016-02-12 — End: 2016-02-12

## 2016-02-12 MED ORDER — POTASSIUM CHLORIDE 10 MEQ/50ML IV SOLN
10.0000 meq | INTRAVENOUS | Status: AC
  Administered 2016-02-12 (×2): 10 meq via INTRAVENOUS
  Filled 2016-02-12 (×2): qty 50

## 2016-02-12 NOTE — Progress Notes (Signed)
Patient ID: [name] Bell, female   DOB: 1981/04/14, 35 y.o.   MRN: [id number] Follow up - Trauma Critical Care  Patient Details:    [name] Bell is an 35 y.o. female.  Lines/tubes : Airway 7.5 mm (Active)  Secured at (cm) 25 cm 02/12/2016  3:38 AM  Measured From Lips 02/12/2016  3:38 AM  Secured Location Left 02/12/2016  3:38 AM  Secured By [employer] 02/12/2016  3:38 AM  Tube Holder Repositioned Yes 02/11/2016 11:52 PM  Cuff Pressure (cm H2O) 26 cm H2O 02/12/2016  3:38 AM  Site Condition Dry 02/12/2016  3:38 AM     CVC Triple Lumen 02/09/16 Right Femoral (Active)  Indication for Insertion or Continuance of Line Vasoactive infusions 02/12/2016  7:40 AM  Site Assessment Intact;Dry 02/11/2016 10:00 PM  Proximal Lumen Status Infusing;Blood return noted 02/11/2016 10:00 PM  Medial Infusing 02/11/2016 10:00 PM  Distal Lumen Status Infusing 02/11/2016 10:00 PM  Dressing Type Transparent;Occlusive;Gauze 02/11/2016 10:00 PM  Dressing Status Clean;Dry;Intact 02/11/2016 10:00 PM  Line Care Tubing changed 02/12/2016  2:00 AM  Dressing Intervention New dressing 02/10/2016  9:00 PM  Dressing Change Due 02/17/16 02/10/2016  9:00 PM     Arterial Line 02/09/16 Right Radial (Active)  Site Assessment Clean;Dry;Intact 02/11/2016 10:00 PM  Line Status Pulsatile blood flow 02/11/2016 10:00 PM  Art Line Waveform Appropriate 02/11/2016 10:00 PM  Art Line Interventions Zeroed and calibrated;Leveled 02/11/2016  7:48 AM  Color/Movement/Sensation Capillary refill less than 3 sec 02/11/2016 10:00 PM  Dressing Type Transparent 02/11/2016 10:00 PM  Dressing Status Clean;Intact;Dry 02/11/2016 10:00 PM     Chest Tube 1 Right Pleural 32 Fr. (Active)  Suction -20 cm H2O 02/11/2016 10:00 PM  Chest Tube Air Leak None 02/11/2016 10:00 PM  Patency Intervention Milked 02/11/2016  8:00 AM  Drainage Description Bright red 02/11/2016 10:00 PM  Dressing Status Clean;Dry;Intact 02/11/2016 10:00 PM  Dressing Intervention Dressing changed 02/12/2016  1:00 AM   Site Assessment Clean;Dry;Intact 02/11/2016 10:00 PM  Surrounding Skin Unable to view 02/11/2016 10:00 PM  Output (mL) 325 mL 02/12/2016  6:00 AM     Chest Tube 1 Right Pleural 32 Fr. (Active)  Suction -20 cm H2O 02/11/2016 10:00 PM  Chest Tube Air Leak None 02/11/2016 10:00 PM  Drainage Description Bright red 02/11/2016 10:00 PM  Dressing Status Clean;Dry;Intact 02/11/2016 10:00 PM  Dressing Intervention Dressing changed 02/12/2016  1:00 AM  Site Assessment Clean;Dry;Intact 02/11/2016 10:00 PM  Surrounding Skin Unable to view 02/11/2016 10:00 PM  Output (mL) 140 mL 02/12/2016  6:00 AM     Negative Pressure Wound Therapy Abdomen (Active)  Last dressing change 02/11/16 02/11/2016 10:00 PM  Site / Wound Assessment Dressing in place / Unable to assess 02/11/2016 10:00 PM  Peri-wound Assessment Bleeding 02/11/2016 10:00 PM  Cycle Continuous 02/11/2016 10:00 PM  Target Pressure (mmHg) Other (Comment) 02/11/2016 10:00 PM  Canister Changed Yes 02/11/2016 11:00 PM  Dressing Status Intact 02/11/2016 10:00 PM  Drainage Amount Copious 02/11/2016 10:00 PM  Output (mL) 500 mL 02/12/2016  4:14 AM     NG/OG Tube Orogastric 16 Fr. Center mouth (Active)  Placement Verification Auscultation 02/11/2016 10:00 PM  Site Assessment Clean;Dry;Intact 02/11/2016 10:00 PM  Status Suction-low intermittent 02/11/2016 10:00 PM  Amount of suction 116 mmHg 02/11/2016 10:00 PM     Urethral Catheter S. Bowman,RN Latex 16 Fr. (Active)  Indication for Insertion or Continuance of Catheter Unstable critical patients (first 24-48 hours);Aggressive IV diuresis 02/12/2016  7:40 AM  Site  Assessment Clean;Intact 02/11/2016 10:00 PM  Catheter Maintenance Bag below level of bladder;Catheter secured;Drainage bag/tubing not touching floor;Insertion date on drainage bag;No dependent loops;Seal intact 02/11/2016 10:00 PM  Collection Container Standard drainage bag 02/11/2016 10:00 PM  Securement Method Securing device (Describe) 02/11/2016 10:00 PM  Urinary Catheter Interventions  Unclamped 02/11/2016  7:48 AM  Output (mL) 75 mL 02/12/2016  7:00 AM    Microbiology/Sepsis markers: Results for orders placed or performed during the hospital encounter of 02/09/16  MRSA PCR Screening     Status: None   Collection Time: 02/11/16 11:30 AM  Result Value Ref Range Status   MRSA by PCR NEGATIVE NEGATIVE Final    Comment:        The GeneXpert MRSA Assay (FDA approved for NASAL specimens only), is one component of a comprehensive MRSA colonization surveillance program. It is not intended to diagnose MRSA infection nor to guide or monitor treatment for MRSA infections.     Anti-infectives:  Anti-infectives    Start     Dose/Rate Route Frequency Ordered Stop   02/11/16 0600  piperacillin-tazobactam (ZOSYN) IVPB 3.375 g     3.375 g 100 mL/hr over 30 Minutes Intravenous 4 times per day 02/10/16 2214     02/10/16 1730  piperacillin-tazobactam (ZOSYN) IVPB 3.375 g  Status:  Discontinued     3.375 g 12.5 mL/hr over 240 Minutes Intravenous 3 times per day 02/10/16 1727 02/10/16 2214      Best Practice/Protocols:  VTE Prophylaxis: Mechanical Continous Sedation  Consults: Treatment Team:  Myrene GalasMichael [name], MD   Subjective:    Overnight Issues: pressors maxed  Objective:  Vital signs for last 24 hours: Temp:  [96 F (35.6 C)-98.4 F (36.9 C)] 98.1 F (36.7 C) (04/06 0800) Pulse Rate:  [108-129] 125 (04/06 0800) Resp:  [0-35] 0 (04/06 0800) BP: (74-163)/(38-103) 106/64 mmHg (04/06 0800) SpO2:  [89 %-100 %] 100 % (04/06 0800) Arterial Line BP: (95-204)/(31-101) 134/62 mmHg (04/06 0800) FiO2 (%):  [80 %-100 %] 80 % (04/06 0600)  Hemodynamic parameters for last 24 hours: CVP:  [17 mmHg-24 mmHg] 24 mmHg  Intake/Output from previous day: 04/05 0701 - 04/06 0700 In: 17077.2 [I.V.:11662.6; Blood:5164.6; IV Piggyback:250] Out: 7495 [Urine:2180; Drains:4100; Blood:280; Chest Tube:935]  Intake/Output this shift: Total I/O In: 363 [Blood:363] Out: -   Vent  settings for last 24 hours: Vent Mode:  [-] PRVC FiO2 (%):  [80 %-100 %] 80 % Set Rate:  [30 bmp-35 bmp] 35 bmp Vt Set:  [430 mL-490 mL] 430 mL PEEP:  [12 cmH20-15 cmH20] 15 cmH20 Plateau Pressure:  [25 cmH20-34 cmH20] 34 cmH20  Physical Exam:  General: on vent Neuro: arouses and F/C HEENT/Neck: ETT Resp: distant but equal CVS: RRR tachy GI: open abdomen VAC, quiet Extremities: extensive edema, complex contsion R hip area, splint L wrist  Results for orders placed or performed during the hospital encounter of 02/09/16 (from the past 24 hour(s))  Prepare fresh frozen plasma     Status: None (Preliminary result)   Collection Time: 02/11/16  9:26 AM  Result Value Ref Range   Unit Number [id number]    Blood Component Type THAWED PLASMA    Unit division 00    Status of Unit ISSUED    Transfusion Status OK TO TRANSFUSE    Unit Number [id number]    Blood Component Type THAWED PLASMA    Unit division 00    Status of Unit ISSUED    Transfusion Status OK TO TRANSFUSE    Unit  Number [id number]    Blood Component Type THAWED PLASMA    Unit division 00    Status of Unit ISSUED    Transfusion Status OK TO TRANSFUSE    Unit Number [id number]    Blood Component Type THAWED PLASMA    Unit division 00    Status of Unit ISSUED    Transfusion Status OK TO TRANSFUSE   BLOOD TRANSFUSION REPORT - SCANNED     Status: None   Collection Time: 02/11/16 10:25 AM   Narrative   Ordered by an unspecified provider.  MRSA PCR Screening     Status: None   Collection Time: 02/11/16 11:30 AM  Result Value Ref Range   MRSA by PCR NEGATIVE NEGATIVE  Provider-confirm verbal Blood Bank order - Type & Screen, RBC, FFP; >10 Units; Order taken: 02/09/2016; 8:15 AM; Level 1 Trauma, Emergency Release, MTP 2 each emergency release RBC and FFP requested by the ED at 0815.  Then again 2 each emergency relea     Status: None   Collection Time: 02/11/16 12:08 PM  Result Value Ref Range   Blood product  order confirm MD AUTHORIZATION REQUESTED   Provider-confirm verbal Blood Bank order - RBC, Platelet Pheresis, FFP, Cryoprecipitate; >10 Units; Order taken: 02/09/2016; 8:50 AM; Level 1 Trauma, STAT, Surgery, MTP; 4 units ahead The patient became an MTP case at 0850, then MTP status was deactiva     Status: None   Collection Time: 02/11/16 12:25 PM  Result Value Ref Range   Blood product order confirm MD AUTHORIZATION REQUESTED   Protime-INR     Status: Abnormal   Collection Time: 02/11/16 12:55 PM  Result Value Ref Range   Prothrombin Time 27.4 (H) 11.6 - 15.2 seconds   INR 2.59 (H) 0.00 - 1.49  Lactic acid, plasma     Status: Abnormal   Collection Time: 02/11/16 12:55 PM  Result Value Ref Range   Lactic Acid, Venous 13.7 (HH) 0.5 - 2.0 mmol/L  Protime-INR     Status: Abnormal   Collection Time: 02/11/16  4:05 PM  Result Value Ref Range   Prothrombin Time 26.5 (H) 11.6 - 15.2 seconds   INR 2.48 (H) 0.00 - 1.49  CBC     Status: Abnormal   Collection Time: 02/11/16  4:05 PM  Result Value Ref Range   WBC 15.2 (H) 4.0 - 10.5 K/uL   RBC 1.64 (L) 3.87 - 5.11 MIL/uL   Hemoglobin 4.7 (LL) 12.0 - 15.0 g/dL   HCT 30.8 (L) 65.7 - 84.6 %   MCV 82.3 78.0 - 100.0 fL   MCH 28.7 26.0 - 34.0 pg   MCHC 34.8 30.0 - 36.0 g/dL   RDW 96.2 95.2 - 84.1 %   Platelets 48 (L) 150 - 400 K/uL  Prepare RBC     Status: None   Collection Time: 02/11/16  5:33 PM  Result Value Ref Range   Order Confirmation ORDER PROCESSED BY BLOOD BANK   Prepare fresh frozen plasma     Status: None (Preliminary result)   Collection Time: 02/11/16  5:34 PM  Result Value Ref Range   Unit Number [id number]    Blood Component Type THAWED PLASMA    Unit division 00    Status of Unit ISSUED    Transfusion Status OK TO TRANSFUSE    Unit Number [id number]    Blood Component Type THAWED PLASMA    Unit division 00    Status of Unit ISSUED    Transfusion Status  OK TO TRANSFUSE    Unit Number [id number]    Blood Component  Type THAWED PLASMA    Unit division 00    Status of Unit ISSUED    Transfusion Status OK TO TRANSFUSE    Unit Number [id number]    Blood Component Type THAWED PLASMA    Unit division 00    Status of Unit ISSUED    Transfusion Status OK TO TRANSFUSE    Unit Number [id number]    Blood Component Type THAWED PLASMA    Unit division 00    Status of Unit ISSUED    Transfusion Status OK TO TRANSFUSE    Unit Number [id number]    Blood Component Type THAWED PLASMA    Unit division 00    Status of Unit ISSUED    Transfusion Status OK TO TRANSFUSE   Prepare RBC     Status: None   Collection Time: 02/11/16  7:28 PM  Result Value Ref Range   Order Confirmation ORDER PROCESSED BY BLOOD BANK   Prepare Pheresed Platelets     Status: None (Preliminary result)   Collection Time: 02/11/16  7:28 PM  Result Value Ref Range   Unit Number [id number]    Blood Component Type PLTPHER LR2    Unit division 00    Status of Unit ISSUED    Transfusion Status OK TO TRANSFUSE   I-STAT 7, (LYTES, BLD GAS, ICA, H+H)     Status: Abnormal   Collection Time: 02/11/16  8:14 PM  Result Value Ref Range   pH, Arterial 7.161 (LL) 7.350 - 7.450   pCO2 arterial 51.2 (H) 35.0 - 45.0 mmHg   pO2, Arterial 53.0 (L) 80.0 - 100.0 mmHg   Bicarbonate 18.4 (L) 20.0 - 24.0 mEq/L   TCO2 20 0 - 100 mmol/L   O2 Saturation 79.0 %   Acid-base deficit 9.0 (H) 0.0 - 2.0 mmol/L   Sodium 139 135 - 145 mmol/L   Potassium 3.4 (L) 3.5 - 5.1 mmol/L   Calcium, Ion 0.80 (L) 1.12 - 1.23 mmol/L   HCT 17.0 (L) 36.0 - 46.0 %   Hemoglobin 5.8 (LL) 12.0 - 15.0 g/dL   Patient temperature 18.8 C    Sample type ARTERIAL   I-STAT 7, (LYTES, BLD GAS, ICA, H+H)     Status: Abnormal   Collection Time: 02/11/16  9:10 PM  Result Value Ref Range   pH, Arterial 7.274 (L) 7.350 - 7.450   pCO2 arterial 41.9 35.0 - 45.0 mmHg   pO2, Arterial 71.0 (L) 80.0 - 100.0 mmHg   Bicarbonate 19.4 (L) 20.0 - 24.0 mEq/L   TCO2 21 0 - 100 mmol/L   O2  Saturation 92.0 %   Acid-base deficit 7.0 (H) 0.0 - 2.0 mmol/L   Sodium 141 135 - 145 mmol/L   Potassium 3.4 (L) 3.5 - 5.1 mmol/L   Calcium, Ion 0.71 (L) 1.12 - 1.23 mmol/L   HCT 21.0 (L) 36.0 - 46.0 %   Hemoglobin 7.1 (L) 12.0 - 15.0 g/dL   Patient temperature HIDE    Sample type ARTERIAL   Glucose, capillary     Status: Abnormal   Collection Time: 02/11/16 10:06 PM  Result Value Ref Range   Glucose-Capillary 251 (H) 65 - 99 mg/dL  Hemoglobin and hematocrit, blood     Status: Abnormal   Collection Time: 02/11/16 10:11 PM  Result Value Ref Range   Hemoglobin 7.2 (L) 12.0 - 15.0 g/dL   HCT 41.6 (L) 60.6 - 30.1 %  Protime-INR     Status: Abnormal   Collection Time: 02/11/16 10:11 PM  Result Value Ref Range   Prothrombin Time 22.4 (H) 11.6 - 15.2 seconds   INR 1.98 (H) 0.00 - 1.49  I-STAT 3, arterial blood gas (G3+)     Status: Abnormal   Collection Time: 02/11/16 11:12 PM  Result Value Ref Range   pH, Arterial 7.329 (L) 7.350 - 7.450   pCO2 arterial 39.2 35.0 - 45.0 mmHg   pO2, Arterial 83.0 80.0 - 100.0 mmHg   Bicarbonate 20.8 20.0 - 24.0 mEq/L   TCO2 22 0 - 100 mmol/L   O2 Saturation 96.0 %   Acid-base deficit 5.0 (H) 0.0 - 2.0 mmol/L   Patient temperature 97.5 F    Collection site ARTERIAL LINE    Drawn by RT    Sample type ARTERIAL   CBC with Differential/Platelet     Status: Abnormal (Preliminary result)   Collection Time: 02/12/16  3:10 AM  Result Value Ref Range   WBC 12.9 (H) 4.0 - 10.5 K/uL   RBC 2.26 (L) 3.87 - 5.11 MIL/uL   Hemoglobin 6.6 (LL) 12.0 - 15.0 g/dL   HCT 14.7 (L) 82.9 - 56.2 %   MCV 84.5 78.0 - 100.0 fL   MCH 29.2 26.0 - 34.0 pg   MCHC 34.6 30.0 - 36.0 g/dL   RDW 13.0 86.5 - 78.4 %   Platelets 37 (L) 150 - 400 K/uL   Neutrophils Relative % PENDING %   Neutro Abs PENDING 1.7 - 7.7 K/uL   Band Neutrophils PENDING %   Lymphocytes Relative PENDING %   Lymphs Abs PENDING 0.7 - 4.0 K/uL   Monocytes Relative PENDING %   Monocytes Absolute PENDING  0.1 - 1.0 K/uL   Eosinophils Relative PENDING %   Eosinophils Absolute PENDING 0.0 - 0.7 K/uL   Basophils Relative PENDING %   Basophils Absolute PENDING 0.0 - 0.1 K/uL   WBC Morphology PENDING    RBC Morphology PENDING    Smear Review PENDING    nRBC PENDING 0 /100 WBC   Metamyelocytes Relative PENDING %   Myelocytes PENDING %   Promyelocytes Absolute PENDING %   Blasts PENDING %  Protime-INR     Status: Abnormal   Collection Time: 02/12/16  3:10 AM  Result Value Ref Range   Prothrombin Time 24.8 (H) 11.6 - 15.2 seconds   INR 2.27 (H) 0.00 - 1.49  Comprehensive metabolic panel     Status: Abnormal   Collection Time: 02/12/16  3:10 AM  Result Value Ref Range   Sodium 139 135 - 145 mmol/L   Potassium 3.2 (L) 3.5 - 5.1 mmol/L   Chloride 103 101 - 111 mmol/L   CO2 21 (L) 22 - 32 mmol/L   Glucose, Bld 227 (H) 65 - 99 mg/dL   BUN 15 6 - 20 mg/dL   Creatinine, Ser 6.96 (H) 0.44 - 1.00 mg/dL   Calcium 5.7 (LL) 8.9 - 10.3 mg/dL   Total Protein 3.6 (L) 6.5 - 8.1 g/dL   Albumin 2.0 (L) 3.5 - 5.0 g/dL   AST 2952 (H) 15 - 41 U/L   ALT 617 (H) 14 - 54 U/L   Alkaline Phosphatase 59 38 - 126 U/L   Total Bilirubin 2.8 (H) 0.3 - 1.2 mg/dL   GFR calc non Af Amer 33 (L) >60 mL/min   GFR calc Af Amer 39 (L) >60 mL/min   Anion gap 15 5 - 15  Prepare RBC  Status: None   Collection Time: 02/12/16  5:10 AM  Result Value Ref Range   Order Confirmation ORDER PROCESSED BY BLOOD BANK   Prepare Pheresed Platelets     Status: None (Preliminary result)   Collection Time: 02/12/16  5:10 AM  Result Value Ref Range   Unit Number [id number]    Blood Component Type PLTP LR1 PAS    Unit division 00    Status of Unit ALLOCATED    Transfusion Status OK TO TRANSFUSE   Prepare fresh frozen plasma     Status: None (Preliminary result)   Collection Time: 02/12/16  5:10 AM  Result Value Ref Range   Unit Number [id number]    Blood Component Type THWPLS APHR1    Unit division 00    Status of Unit  ISSUED    Transfusion Status OK TO TRANSFUSE    Unit Number [id number]    Blood Component Type THAWED PLASMA    Unit division 00    Status of Unit ISSUED    Transfusion Status OK TO TRANSFUSE   Prepare cryoprecipitate     Status: None (Preliminary result)   Collection Time: 02/12/16  5:10 AM  Result Value Ref Range   Unit Number [id number]    Blood Component Type CRYPOOL THAW    Unit division 00    Status of Unit ALLOCATED    Transfusion Status OK TO TRANSFUSE     Assessment & Plan: Present on Admission:  . Fall from, out of or through building, not otherwise specified, initial encounter . Hypovolemic shock (HCC)   LOS: 3 days   Additional comments:I reviewed the patient's new clinical lab test results. 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 Acetaminophen level neg. No need for n acetylcystine per GI. Tylenol ingestion still likely caused liver damage. Mult L wrist FXs - splint for now Hepatic failure - hemorrhagic shock plus ingestion of toxins, continues to worsen and complicates coagulopathy AKI - U/O now tapering, bicarb drip CV - maxed on levo/neo/vaso ABL anemia - TF now, labs at 1200 Pelvic FX - S/P ex fix and angioembolization FEN - hypocalcemia treated, gentle repletion of K for hypokalemia VTE - PAS  Dispo - ICU, I will discuss goals of care with her sister. Her prognosis is grim. Critical Care Total Time*: 1 Hour 30 Minutes  [name], MD, MPH, [facility] Trauma:  [phone] General Surgery: [phone]  02/12/2016  *Care during the described time interval was provided by me. I have reviewed this patient's available data, including medical history, events of note, physical examination and test results as part of my evaluation.

## 2016-02-12 NOTE — Progress Notes (Signed)
Patient ID: [name] Bell, female   DOB: 07-18-1981, [age] y.o.   MRN: [id number] Worsening hypotension. Completing blood products. Will give albumin bolus. Pressors remian maxed. I updated her sister. We are reaching the limits of what we can do. [name] [name], MD, MPH, FACS Trauma: [phone] General Surgery: [phone]

## 2016-02-12 NOTE — Progress Notes (Signed)
Patient ID: [name] Bell, female   DOB: [birth year], 35 y.o.   MRN: [id number] SBP 40s, Hb 5.9 despite blood products this AM. Will TF 2u PRBC now. I spoke with her sister and let her know that Meade MawKatia is dying despite all of our efforts. CSW to speak with her. [name] Romanita Fager, MD, MPH, FACS Trauma: [phone] General Surgery: [phone]

## 2016-02-12 NOTE — Progress Notes (Signed)
Patient ID: [name] Bell, female   DOB: [birth year], 35 y.o.   MRN: [id number] I spoke with her sister and explained [name]'s current situation with max pressor needs, worsening liver failure and worsening renal failure. She expressed understanding and wants us to continue doing all we can. If she worsens further I told her we will need to further discuss goals of care. She wants to speak with CSW but says she is prepared for the worst.   [name] Rathana Viveros, MD, MPH, FACS Trauma: [phone] General Surgery: [phone]

## 2016-02-12 NOTE — Progress Notes (Signed)
CRITICAL VALUE ALERT  Critical value received:  Calcium 5.6  Date of notification:  02/12/2016  Time of notification:  1115  Critical value read back:Yes.    Nurse who received alert:  Docia BarrierJosh Margarito Dehaas, RN  MD notified (1st page):  Dr. Janee Mornhompson  Time of first page:  1145  MD notified (2nd page):  Time of second page:  Responding MD:  Dr. Janee Mornhompson  Time MD responded:  1150

## 2016-02-12 NOTE — Progress Notes (Signed)
CRITICAL VALUE ALERT  Critical value received:  Hgb 6.2  Date of notification:  02/12/2016  Time of notification:  2257  Critical value read back:Yes.    Nurse who received alert:  Alphia Mohevon Avrielle Fry  MD notified (1st page):  Trauma MD Donell BeersByerly  Time of first page:  2257  MD notified (2nd page):  Time of second page:  Responding MD:  Donell BeersByerly  Time MD responded:  2258

## 2016-02-12 NOTE — Progress Notes (Signed)
Pt Hgb 6.2. Trauma MD notified. Labs reviewed with MD. Orders given for 1 unit FFP, 2 units packed cells.   Notified MD that external fixation pin sites still oozing serosanguinous fluid.   Unable to doppler R foot pulses. See charting. Able to doppler R popliteal pulse. Pt able to move extremity spontaneously and follow commands. Orders given to elevate extremity. Will continue to monitor.

## 2016-02-12 NOTE — Progress Notes (Signed)
Patient seen and evaluated.    Hypotension No longer speaking. Serous drainage from pins.  Case and condition discussed with Dr. Janee Mornhompson who was at bedside.  Spoke with sister. Standing by.  Myrene GalasMichael Lilia Letterman, MD [facility] [phone] (p)

## 2016-02-12 NOTE — Progress Notes (Signed)
   02/12/16 1346  Clinical Encounter Type  Visited With Patient and family together  Visit Type Initial;Follow-up;Critical Care  Referral From Chaplain  Spiritual Encounters  Spiritual Needs Emotional;Grief support  Stress Factors  Family Stress Factors Health changes;Major life changes   Chaplain met with patient's sister Zigmund Daniel to provide continual pastoral support. Chaplain and patient's sister facilitated life review, and the chaplain offered support through empathic listening. Patient's sister feels really conflicted. Indicated that she doesn't understand, 'why God would save her (immediately after the attempt) to have her die a few days later.' Sister indicated she doesn't want to give up on the patient (and on God), and she doesn't want the patient to suffer. Chaplain support available as needed.   Jeri Lager, Chaplain 02/12/2016 1:49 PM

## 2016-02-12 NOTE — Progress Notes (Signed)
CRITICAL VALUE ALERT  Critical value received:  hgb 6.9  Date of notification:  4/6  Time of notification:  400  Critical value read back:Yes.    Nurse who received alert:  Tammy  MD notified (1st page):  Lindie SpruceWyatt  Time of first page:  405  MD notified (2nd page): Lindie SpruceWyatt  Time of second page: 430  Responding MD: Lindie SpruceWyatt  Time MD responded:430  Orders received and carried out

## 2016-02-12 NOTE — Progress Notes (Signed)
Patient ID: [name] Bell, female   DOB: 01-02-1981, 35 y.o.   MRN: [id number] I met again with [name] Bell's sister and discussed the situation. [name] Bell's BP is better now but I feel this will be transient. She remains maxed on pressors with multiorgan dysfunction. She does not want to make [name] a DNR at this time and wants [place] to continue all efforts. I will check CBC now. I do not feel any further surgery will reverse her decline and blood products are only temporizing.  Georganna Skeans, MD, MPH, FACS Trauma: [phone] General Surgery: [phone]

## 2016-02-12 NOTE — Progress Notes (Signed)
5 Fr rfa sheath removed using exoseal and manual pressure.  Hemostasis obtained 1738.  Tegaderm applied.  Groin site reviewed with RN.

## 2016-02-12 NOTE — Progress Notes (Signed)
Met with pt's sister, Ulis Rias, at her request.  She states she has information for financial counselor regarding pt's Medicaid, and wants to get in touch with her.  Per notes, financial counselor is Wendall Stade.  I have attempted to call Ms. Ronnald Ramp multiple times with no answer, and have left message for her to call me on her voicemail.  Pt's sister is understandably emotional, as pt's condition seems to not be improving.  Ulis Rias states her sister came to live with her from [place] area approx one month ago, because she stated she was lonely.  She says pt has had bouts of depression throughout her life, and states she cannot deny that her sister has tried to kill herself in the past.  Pt is 15 years younger than Ulis Rias, and they have no relationship with their mother, per Kaye's report.  Laveyah's father is deceased.  Ulis Rias states she has often felt like a mother figure to [place], due to their age difference.   Ulis Rias requested work note for her current employer, [employer].  Will prepare and fax to her work, per her request.   Escorted sister to [facility] and requested chaplain to visit her there, per her request.  Sister appreciative of help given.  Will follow progress/offer support as given.    Reinaldo Raddle, RN, BSN  Trauma/Neuro ICU Case Manager [phone]

## 2016-02-12 NOTE — Progress Notes (Signed)
RT transported patient to CT and back to 3M without any complications. °

## 2016-02-13 ENCOUNTER — Inpatient Hospital Stay (HOSPITAL_COMMUNITY): Payer: Medicaid Other

## 2016-02-13 ENCOUNTER — Inpatient Hospital Stay (HOSPITAL_COMMUNITY): Payer: Medicaid Other | Admitting: Certified Registered"

## 2016-02-13 ENCOUNTER — Encounter (HOSPITAL_COMMUNITY): Admission: EM | Disposition: A | Discharge status: Rehabilitation Facility | Payer: Self-pay | Source: Home / Self Care

## 2016-02-13 ENCOUNTER — Encounter (HOSPITAL_COMMUNITY): Payer: Self-pay | Admitting: General Surgery

## 2016-02-13 HISTORY — PX: LAPAROTOMY: SHX154

## 2016-02-13 LAB — TYPE AND SCREEN
ABO/RH(D): O POS
ANTIBODY SCREEN: NEGATIVE
UNIT DIVISION: 0
UNIT DIVISION: 0
UNIT DIVISION: 0
UNIT DIVISION: 0
UNIT DIVISION: 0
UNIT DIVISION: 0
UNIT DIVISION: 0
UNIT DIVISION: 0
UNIT DIVISION: 0
UNIT DIVISION: 0
UNIT DIVISION: 0
UNIT DIVISION: 0
UNIT DIVISION: 0
UNIT DIVISION: 0
UNIT DIVISION: 0
UNIT DIVISION: 0
UNIT DIVISION: 0
UNIT DIVISION: 0
UNIT DIVISION: 0
UNIT DIVISION: 0
UNIT DIVISION: 0
UNIT DIVISION: 0
UNIT DIVISION: 0
UNIT DIVISION: 0
Unit division: 0
Unit division: 0
Unit division: 0
Unit division: 0
Unit division: 0
Unit division: 0
Unit division: 0
Unit division: 0
Unit division: 0
Unit division: 0
Unit division: 0
Unit division: 0
Unit division: 0
Unit division: 0
Unit division: 0
Unit division: 0
Unit division: 0
Unit division: 0
Unit division: 0
Unit division: 0
Unit division: 0
Unit division: 0
Unit division: 0
Unit division: 0
Unit division: 0
Unit division: 0
Unit division: 0
Unit division: 0
Unit division: 0
Unit division: 0
Unit division: 0
Unit division: 0
Unit division: 0
Unit division: 0

## 2016-02-13 LAB — CBC
HCT: 20.9 % — ABNORMAL LOW (ref 36.0–46.0)
HEMATOCRIT: 27.1 % — AB (ref 36.0–46.0)
Hemoglobin: 7.3 g/dL — ABNORMAL LOW (ref 12.0–15.0)
Hemoglobin: 9 g/dL — ABNORMAL LOW (ref 12.0–15.0)
MCH: 29.3 pg (ref 26.0–34.0)
MCH: 27.9 pg (ref 26.0–34.0)
MCHC: 34.9 g/dL (ref 30.0–36.0)
MCHC: 33.2 g/dL (ref 30.0–36.0)
MCV: 83.9 fL (ref 78.0–100.0)
MCV: 83.9 fL (ref 78.0–100.0)
PLATELETS: 26 10*3/uL — AB (ref 150–400)
Platelets: 23 10*3/uL — CL (ref 150–400)
RBC: 2.49 MIL/uL — AB (ref 3.87–5.11)
RBC: 3.23 MIL/uL — ABNORMAL LOW (ref 3.87–5.11)
RDW: 15.3 % (ref 11.5–15.5)
RDW: 14.8 % (ref 11.5–15.5)
WBC: 18.3 10*3/uL — AB (ref 4.0–10.5)
WBC: 16.3 10*3/uL — ABNORMAL HIGH (ref 4.0–10.5)

## 2016-02-13 LAB — PREPARE FRESH FROZEN PLASMA
UNIT DIVISION: 0
UNIT DIVISION: 0
UNIT DIVISION: 0
Unit division: 0
Unit division: 0
Unit division: 0
Unit division: 0

## 2016-02-13 LAB — HEPATIC FUNCTION PANEL
ALT: 586 U/L — ABNORMAL HIGH (ref 14–54)
AST: 873 U/L — AB (ref 15–41)
Albumin: 1.8 g/dL — ABNORMAL LOW (ref 3.5–5.0)
Alkaline Phosphatase: 71 U/L (ref 38–126)
BILIRUBIN DIRECT: 1.9 mg/dL — AB (ref 0.1–0.5)
BILIRUBIN INDIRECT: 3.1 mg/dL — AB (ref 0.3–0.9)
BILIRUBIN TOTAL: 5 mg/dL — AB (ref 0.3–1.2)
Total Protein: 3.5 g/dL — ABNORMAL LOW (ref 6.5–8.1)

## 2016-02-13 LAB — DIC (DISSEMINATED INTRAVASCULAR COAGULATION) PANEL
APTT: 36 s (ref 24–37)
FIBRINOGEN: 316 mg/dL (ref 204–475)
INR: 2.04 — AB (ref 0.00–1.49)
PROTHROMBIN TIME: 22.9 s — AB (ref 11.6–15.2)

## 2016-02-13 LAB — PREPARE CRYOPRECIPITATE: Unit division: 0

## 2016-02-13 LAB — BASIC METABOLIC PANEL
ANION GAP: 18 — AB (ref 5–15)
BUN: 22 mg/dL — ABNORMAL HIGH (ref 6–20)
CALCIUM: 5.4 mg/dL — AB (ref 8.9–10.3)
CO2: 20 mmol/L — ABNORMAL LOW (ref 22–32)
Chloride: 92 mmol/L — ABNORMAL LOW (ref 101–111)
Creatinine, Ser: 2.1 mg/dL — ABNORMAL HIGH (ref 0.44–1.00)
GFR, EST AFRICAN AMERICAN: 35 mL/min — AB (ref >60)
GFR, EST NON AFRICAN AMERICAN: 30 mL/min — AB (ref >60)
Glucose, Bld: 239 mg/dL — ABNORMAL HIGH (ref 65–99)
POTASSIUM: 3.5 mmol/L (ref 3.5–5.1)
SODIUM: 130 mmol/L — AB (ref 135–145)

## 2016-02-13 LAB — POCT I-STAT 3, ART BLOOD GAS (G3+)
Acid-Base Excess: 1 mmol/L (ref 0.0–2.0)
Acid-base deficit: 2 mmol/L (ref 0.0–2.0)
Bicarbonate: 22.6 mEq/L (ref 20.0–24.0)
Bicarbonate: 26.7 mEq/L — ABNORMAL HIGH (ref 20.0–24.0)
O2 SAT: 98 %
O2 SAT: 99 %
PCO2 ART: 36.7 mmHg (ref 35.0–45.0)
PCO2 ART: 42.5 mmHg (ref 35.0–45.0)
PO2 ART: 103 mmHg — AB (ref 80.0–100.0)
PO2 ART: 158 mmHg — AB (ref 80.0–100.0)
Patient temperature: 97.5
Patient temperature: 96.7
TCO2: 24 mmol/L (ref 0–100)
TCO2: 28 mmol/L (ref 0–100)
pH, Arterial: 7.395 (ref 7.350–7.450)
pH, Arterial: 7.402 (ref 7.350–7.450)

## 2016-02-13 LAB — PREPARE PLATELET PHERESIS: Unit division: 0

## 2016-02-13 LAB — PROTIME-INR
INR: 1.75 — AB (ref 0.00–1.49)
Prothrombin Time: 20.4 seconds — ABNORMAL HIGH (ref 11.6–15.2)

## 2016-02-13 LAB — DIC (DISSEMINATED INTRAVASCULAR COAGULATION)PANEL
D-Dimer, Quant: 14.61 ug/mL-FEU — ABNORMAL HIGH (ref 0.00–0.50)
Platelets: 27 10*3/uL — CL (ref 150–400)
Smear Review: NONE SEEN

## 2016-02-13 LAB — PREPARE RBC (CROSSMATCH)

## 2016-02-13 SURGERY — LAPAROTOMY, EXPLORATORY
Anesthesia: General | Site: Abdomen

## 2016-02-13 MED ORDER — CALCIUM GLUCONATE 10 % IV SOLN: 1.0000 g | Freq: Once | INTRAVENOUS | Status: DC

## 2016-02-13 MED ORDER — PIVOT 1.5 CAL PO LIQD
1000.0000 mL | ORAL | Status: DC
  Administered 2016-02-13: 1000 mL

## 2016-02-13 MED ORDER — SODIUM CHLORIDE 0.9 % IJ SOLN
INTRAMUSCULAR | Status: AC
  Filled 2016-02-13: qty 10

## 2016-02-13 MED ORDER — DEXTROSE-NACL 5-0.9 % IV SOLN: INTRAVENOUS | Status: DC

## 2016-02-13 MED ORDER — SODIUM CHLORIDE 0.9 % IV SOLN
Freq: Once | INTRAVENOUS | Status: AC
  Administered 2016-02-13: 10 mL/h via INTRAVENOUS

## 2016-02-13 MED ORDER — ROCURONIUM BROMIDE 50 MG/5ML IV SOLN
INTRAVENOUS | Status: AC
  Filled 2016-02-13: qty 1

## 2016-02-13 MED ORDER — FENTANYL CITRATE (PF) 100 MCG/2ML IJ SOLN
INTRAMUSCULAR | Status: DC | PRN
  Administered 2016-02-13: 100 ug via INTRAVENOUS
  Administered 2016-02-13: 250 ug via INTRAVENOUS

## 2016-02-13 MED ORDER — MIDAZOLAM HCL 5 MG/5ML IJ SOLN
INTRAMUSCULAR | Status: DC | PRN
  Administered 2016-02-13: 2 mg via INTRAVENOUS

## 2016-02-13 MED ORDER — LORAZEPAM 2 MG/ML IJ SOLN
1.0000 mg | INTRAMUSCULAR | Status: DC | PRN
  Administered 2016-02-13 – 2016-02-29 (×20): 1 mg via INTRAVENOUS
  Filled 2016-02-13 (×21): qty 1

## 2016-02-13 MED ORDER — LACTATED RINGERS IV SOLN
INTRAVENOUS | Status: DC | PRN
  Administered 2016-02-13: 13:00:00 via INTRAVENOUS

## 2016-02-13 MED ORDER — FENTANYL CITRATE (PF) 250 MCG/5ML IJ SOLN
INTRAMUSCULAR | Status: AC
  Filled 2016-02-13: qty 5

## 2016-02-13 MED ORDER — 0.9 % SODIUM CHLORIDE (POUR BTL) OPTIME
TOPICAL | Status: DC | PRN
  Administered 2016-02-13 (×3): 1000 mL

## 2016-02-13 MED ORDER — MIDAZOLAM HCL 2 MG/2ML IJ SOLN
INTRAMUSCULAR | Status: AC
  Filled 2016-02-13: qty 2

## 2016-02-13 MED ORDER — HYDROCORTISONE NA SUCCINATE PF 100 MG IJ SOLR
100.0000 mg | Freq: Three times a day (TID) | INTRAMUSCULAR | Status: DC
  Administered 2016-02-13 – 2016-02-17 (×13): 100 mg via INTRAVENOUS
  Filled 2016-02-13 (×13): qty 2

## 2016-02-13 MED ORDER — DEXTROSE-NACL 5-0.9 % IV SOLN
INTRAVENOUS | Status: DC
  Administered 2016-02-13 – 2016-02-14 (×2): 50 mL/h via INTRAVENOUS
  Administered 2016-02-16 – 2016-02-20 (×4): via INTRAVENOUS

## 2016-02-13 MED ORDER — ROCURONIUM BROMIDE 100 MG/10ML IV SOLN
INTRAVENOUS | Status: DC | PRN
  Administered 2016-02-13: 50 mg via INTRAVENOUS

## 2016-02-13 MED ORDER — EPHEDRINE SULFATE 50 MG/ML IJ SOLN
INTRAMUSCULAR | Status: AC
  Filled 2016-02-13: qty 1

## 2016-02-13 MED ORDER — SODIUM CHLORIDE 0.9 % IV SOLN
2.0000 g | Freq: Once | INTRAVENOUS | Status: AC
  Administered 2016-02-13: 2 g via INTRAVENOUS
  Filled 2016-02-13: qty 20

## 2016-02-13 MED ORDER — PROPOFOL 10 MG/ML IV BOLUS
INTRAVENOUS | Status: AC
  Filled 2016-02-13: qty 20

## 2016-02-13 SURGICAL SUPPLY — 35 items
CANISTER SUCTION 2500CC (MISCELLANEOUS) ×3 IMPLANT
CANISTER WOUND CARE 500ML ATS (WOUND CARE) ×3 IMPLANT
COVER SURGICAL LIGHT HANDLE (MISCELLANEOUS) ×3 IMPLANT
DRAPE LAPAROSCOPIC ABDOMINAL (DRAPES) ×3 IMPLANT
DRAPE WARM FLUID 44X44 (DRAPE) ×3 IMPLANT
ELECT CAUTERY BLADE 6.4 (BLADE) ×3 IMPLANT
ELECT REM PT RETURN 9FT ADLT (ELECTROSURGICAL) ×3
ELECTRODE REM PT RTRN 9FT ADLT (ELECTROSURGICAL) ×1 IMPLANT
GLOVE BIO SURGEON STRL SZ7 (GLOVE) ×3 IMPLANT
GLOVE BIO SURGEON STRL SZ8 (GLOVE) ×3 IMPLANT
GLOVE BIOGEL PI IND STRL 7.0 (GLOVE) ×1 IMPLANT
GLOVE BIOGEL PI IND STRL 8 (GLOVE) ×2 IMPLANT
GLOVE BIOGEL PI INDICATOR 7.0 (GLOVE) ×2
GLOVE BIOGEL PI INDICATOR 8 (GLOVE) ×4
GLOVE ECLIPSE 7.5 STRL STRAW (GLOVE) ×3 IMPLANT
GOWN STRL REUS W/ TWL LRG LVL3 (GOWN DISPOSABLE) ×2 IMPLANT
GOWN STRL REUS W/ TWL XL LVL3 (GOWN DISPOSABLE) ×1 IMPLANT
GOWN STRL REUS W/TWL LRG LVL3 (GOWN DISPOSABLE) ×4
GOWN STRL REUS W/TWL XL LVL3 (GOWN DISPOSABLE) ×2
KIT BASIN OR (CUSTOM PROCEDURE TRAY) ×3 IMPLANT
KIT ROOM TURNOVER OR (KITS) ×3 IMPLANT
NS IRRIG 1000ML POUR BTL (IV SOLUTION) ×9 IMPLANT
PACK GENERAL/GYN (CUSTOM PROCEDURE TRAY) ×3 IMPLANT
PAD ARMBOARD 7.5X6 YLW CONV (MISCELLANEOUS) ×3 IMPLANT
SPONGE ABDOMINAL VAC ABTHERA (MISCELLANEOUS) ×3 IMPLANT
SPONGE LAP 18X18 X RAY DECT (DISPOSABLE) ×3 IMPLANT
STAPLER VISISTAT 35W (STAPLE) IMPLANT
SUCTION POOLE TIP (SUCTIONS) ×3 IMPLANT
SUT PDS AB 1 TP1 96 (SUTURE) IMPLANT
SUT SILK 2 0 SH CR/8 (SUTURE) IMPLANT
SUT SILK 2 0 TIES 10X30 (SUTURE) ×3 IMPLANT
SUT SILK 3 0 SH CR/8 (SUTURE) IMPLANT
SUT SILK 3 0 TIES 10X30 (SUTURE) IMPLANT
TOWEL OR 17X26 10 PK STRL BLUE (TOWEL DISPOSABLE) ×3 IMPLANT
YANKAUER SUCT BULB TIP NO VENT (SUCTIONS) IMPLANT

## 2016-02-13 NOTE — Anesthesia Postprocedure Evaluation (Signed)
Anesthesia Post Note  Patient: [name] Bell  Procedure(s) Performed: Procedure(s) (LRB): EXPLORATORY LAPAROTOMY, REMOVAL OF PACKS, ABDOMINAL VAC DRESSING CHANGE (N/A)  Patient location during evaluation: ICU Anesthesia Type: General Level of consciousness: sedated Pain management: pain level controlled Vital Signs Assessment: post-procedure vital signs reviewed and stable Respiratory status: patient remains intubated per anesthesia plan and patient on ventilator - see flowsheet for VS Cardiovascular status: blood pressure returned to baseline Anesthetic complications: no    Last Vitals:  Filed Vitals:   02/13/16 1600 02/13/16 1615  BP: 108/73 114/68  Pulse: 116 115  Temp:    Resp: 12 7    Last Pain:  Filed Vitals:   02/13/16 1631  PainSc: 0-No pain                 Quantisha Marsicano A

## 2016-02-13 NOTE — Progress Notes (Signed)
Patient ID: [name], female   DOB: [birth year], 35 y.o.   MRN: [id number] I discussed the planned ex lap and removal of packs with her sister this AM about 0845. I discussed the fact that Meade MawKatia remains critically ill on maximal pressor support and is needing a variety of additional blood products this AM. She is agreeable and remains optimistic for [name]'s recovery. [name], MD, MPH, FACS Trauma: [phone] General Surgery: [phone]

## 2016-02-13 NOTE — Progress Notes (Signed)
Pt is no longer responding to voice, is not able to follow commands. Called (trauma) Dr. Magnus IvanBlackman to let him know about the LOC changes, pt has received ativan twice today and medications related to her procedure in the OR earlier today. He advised us to continue to watch her for responsiveness and report it again if she doesn't respond over the next 3-4 hours.

## 2016-02-13 NOTE — Op Note (Signed)
02/09/2016 - 02/13/2016  1:59 PM  PATIENT:  [name] Bell Born  35 y.o. female  PRE-OPERATIVE DIAGNOSIS:  open abdomen  POST-OPERATIVE DIAGNOSIS:  open abdomen  PROCEDURE:  Procedure(s): EXPLORATORY LAPAROTOMY, REMOVAL OF PACKS, ABDOMINAL VAC DRESSING CHANGE  SURGEON:  [name] Jaeli Grubb, MD  ASSISTANTS: Jimmye NormanJames [name], MD   ANESTHESIA:   general  EBL:  Total I/O In: 2430.7 [I.V.:1530.7; Blood:780; IV Piggyback:120] Out: -   BLOOD ADMINISTERED: 1u PRBC  DRAINS: none   SPECIMEN:  No Specimen  DISPOSITION OF SPECIMEN:  N/A  COUNTS:  YES  DICTATION: .Dragon Dictation Findings: 3 packs removed from the preperitoneal pelvic packing and 3 packs removed from around the liver without any significant ongoing hemorrhage. Bowel is viable. Evolving contusion of rectosigmoid.  Procedure in detail: [name] Bell is brought back to the operating room for reexploration of her open abdomen. She has remained in shock and required multiple blood product transfusions. She remains on pressors. Informed consent was obtained from her sister. She is brought directly to the operating room from the intensive care unit on the ventilator. General anesthesia was administered by the anesthesia staff. Time out procedure was performed. We removed the outer portions of her VAC. The inner drape and her abdominal surface were prepped and draped in sterile fashion. The inner VAC drape was removed. We removed scattered blood clots. The bowel appeared viable on first inspection. Abdomen was irrigated. We irrigated the preperitoneal space and remove 3 laparotomy sponges from there. There is no significant ongoing bleeding. Next we irrigated the area above the liver and removed the 3 laparotomy sponges from there. The liver appeared viable for the most part. The large laceration in segment 4 had no significant bleeding and some of the colon mesentery was stuck up in their this was not removed. There is no large ongoing bleeding so no further  packs were placed. We did divide the falciform between [facility] clamps and tied securely with 2-0 silk. Abdomen was further irrigated. The right colon, transverse colon, left colon, and sigmoid colon were intact. The rectosigmoid area had evolving contusion that was present on her last exploration that there was no evidence of ischemia. The small bowel was run from the ligament of Treitz to the terminal ileum and it was viable in its entirety including the small bowel anastomosis. She continues to have far too much edema to close her abdomen. Open abdomen VAC was applied. The inner drape was tucked around the bowel carefully. 2 blue sponges were fashioned and placed over the top. The abdominal wall was cleaned and prepped and VAC drapes were applied. VAC was hooked up to suction and excellent seal was obtained. All counts were correct including the additional removal of the 6 packs that were inside at the beginning of the case. There were no apparent complications. She remains critically ill and was taken directly back to the intensive care unit on the ventilator. PATIENT DISPOSITION:  ICU - intubated and critically ill.   Delay start of Pharmacological VTE agent (>24hrs) due to surgical blood loss or risk of bleeding:  yes  [name] Rickey Sadowski, MD, MPH, FACS Pager: [phone]  4/7/20171:59 PM

## 2016-02-13 NOTE — Anesthesia Preprocedure Evaluation (Signed)
Anesthesia Evaluation  Patient identified by MRN, date of birth, ID band Patient awake and Patient unresponsive  General Assessment Comment:Pt intubated.  Reviewed: Allergy & Precautions, NPO status , Patient's Chart, lab work & pertinent test results, Unable to perform ROS - Chart review only  Airway Mallampati: Intubated  TM Distance: >3 FB Neck ROM: Limited    Dental  (+) Teeth Intact, Dental Advisory Given   Pulmonary  Acute respiratory failure s/p intubation     unstable + intubated    Cardiovascular negative cardio ROS   Rhythm:Regular Rate:Tachycardia  Hypotensive requiring pressor support   Neuro/Psych Suicide attempt 4/3/17negative neurological ROS     GI/Hepatic negative GI ROS, Neg liver ROS, Allegedly ingested liquid drain-o, 2 bottles liquid tylenol EGD today showed stomach ulcer Concern for dead bowel   Endo/Other  Morbid obesityobese  Renal/GU negative Renal ROS     Musculoskeletal S/p pelvic ex-fix after jumping from 5 story building   Abdominal (+) + obese,   Peds  Hematology  (+) Blood dyscrasia, anemia ,   Anesthesia Other Findings Day of surgery medications reviewed with the patient.  Reproductive/Obstetrics negative OB ROS                             Anesthesia Physical Anesthesia Plan  ASA: IV  Anesthesia Plan: General   Post-op Pain Management:    Induction: Inhalational  Airway Management Planned: Oral ETT  Additional Equipment: Arterial line  Intra-op Plan:   Post-operative Plan: Post-operative intubation/ventilation  Informed Consent: I have reviewed the patients History and Physical, chart, labs and discussed the procedure including the risks, benefits and alternatives for the proposed anesthesia with the patient or authorized representative who has indicated his/her understanding and acceptance.     Plan Discussed with: CRNA, Anesthesiologist  and Surgeon  Anesthesia Plan Comments:         Anesthesia Quick Evaluation

## 2016-02-13 NOTE — Progress Notes (Signed)
CRITICAL VALUE ALERT  Critical value received:  Platelets 27  Date of notification: 02/13/2016  Time of notification:  0722  Critical value read back:Yes.    Nurse who received alert:  Alphia Mohevon Millie Forde, RN  MD notified (1st page):  Janee Mornhompson  Time of first page:  0722  MD notified (2nd page):  Time of second page:  Responding MD:  Janee Mornhompson  Time MD responded:  [phone]

## 2016-02-13 NOTE — Progress Notes (Signed)
Utilization review complete. Willi Borowiak RN CCM Case Mgmt phone [phone] 
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
## 2016-02-13 NOTE — Progress Notes (Signed)
CRITICAL VALUE ALERT  Critical value received:  Calcium 5.4  Date of notification: 02/13/2016  Time of notification:  0645  Critical value read back:Yes.    Nurse who received alert:  Alphia Mohevon Mashanda Ishibashi, RN  MD notified (1st page):  Violeta Gelinashompson, [place]  Time of first page: [phone]  MD notified (2nd page):  Time of second page:  Responding MD:  Janee Mornhompson  Time MD responded:  [phone]

## 2016-02-13 NOTE — Transfer of Care (Signed)
Immediate Anesthesia Transfer of Care Note  Patient: [name] Bell  Procedure(s) Performed: Procedure(s): EXPLORATORY LAPAROTOMY, REMOVAL OF PACKS, ABDOMINAL VAC DRESSING CHANGE (N/A)  Patient Location: NICU  Anesthesia Type:General  Level of Consciousness: sedated and unresponsive  Airway & Oxygen Therapy: Patient remains intubated per anesthesia plan and Patient placed on Ventilator (see vital sign flow sheet for setting)  Post-op Assessment: Report given to RN and Post -op Vital signs reviewed and stable  Post vital signs: Reviewed and stable  Last Vitals:  Filed Vitals:   02/13/16 1215 02/13/16 1230  BP: 127/77 124/80  Pulse: 114 114  Temp:  36.7 C  Resp: 0 0    Complications: No apparent anesthesia complications

## 2016-02-13 NOTE — Progress Notes (Signed)
Inpatient Diabetes Program Recommendations  AACE/ADA: New Consensus Statement on Inpatient Glycemic Control (2015)  Target Ranges:  Prepandial:   less than 140 mg/dL      Peak postprandial:   less than 180 mg/dL (1-2 hours)      Critically ill patients:  140 - 180 mg/dL   Results for [name] Bell, [name] (MRN [id number]) as of 02/13/2016 09:16  Ref. Range 02/12/2016 03:10 02/12/2016 10:25 02/13/2016 06:00  Glucose Latest Ref Range: 65-99 mg/dL 696227 (H) 295241 (H) 284239 (H)   Review of Glycemic Control  Diabetes history: none Current orders for Inpatient glycemic control: None  Inpatient Diabetes Program Recommendations:   Patient on IV Solucortef 100 mg Q8hrs. Glucose in the 200 range. Please consider starting Novolog Sensitive Correction + HS scale.  Thanks  Christena DeemShannon Virga Haltiwanger RN, MSN, [facility] Inpatient Diabetes Coordinator Team Pager [phone] (8a-5p)

## 2016-02-14 ENCOUNTER — Inpatient Hospital Stay (HOSPITAL_COMMUNITY): Payer: Medicaid Other

## 2016-02-14 LAB — HEMOGLOBIN AND HEMATOCRIT, BLOOD
HCT: 24.5 % — ABNORMAL LOW (ref 36.0–46.0)
HEMATOCRIT: 22 % — AB (ref 36.0–46.0)
HEMATOCRIT: 25.1 % — AB (ref 36.0–46.0)
HEMOGLOBIN: 8.6 g/dL — AB (ref 12.0–15.0)
Hemoglobin: 7.5 g/dL — ABNORMAL LOW (ref 12.0–15.0)
Hemoglobin: 8.4 g/dL — ABNORMAL LOW (ref 12.0–15.0)

## 2016-02-14 LAB — POCT I-STAT 3, ART BLOOD GAS (G3+)
ACID-BASE EXCESS: 3 mmol/L — AB (ref 0.0–2.0)
Acid-Base Excess: 5 mmol/L — ABNORMAL HIGH (ref 0.0–2.0)
BICARBONATE: 26.3 meq/L — AB (ref 20.0–24.0)
Bicarbonate: 28.6 mEq/L — ABNORMAL HIGH (ref 20.0–24.0)
O2 SAT: 97 %
O2 Saturation: 100 %
PCO2 ART: 35.3 mmHg (ref 35.0–45.0)
PH ART: 7.515 — AB (ref 7.350–7.450)
PO2 ART: 178 mmHg — AB (ref 80.0–100.0)
PO2 ART: 75 mmHg — AB (ref 80.0–100.0)
Patient temperature: 97.4
TCO2: 30 mmol/L (ref 0–100)
TCO2: 27 mmol/L (ref 0–100)
pCO2 arterial: 31.8 mmHg — ABNORMAL LOW (ref 35.0–45.0)
pH, Arterial: 7.523 — ABNORMAL HIGH (ref 7.350–7.450)

## 2016-02-14 LAB — CBC
HCT: 24.4 % — ABNORMAL LOW (ref 36.0–46.0)
HEMATOCRIT: 21 % — AB (ref 36.0–46.0)
HEMOGLOBIN: 7.2 g/dL — AB (ref 12.0–15.0)
Hemoglobin: 8.6 g/dL — ABNORMAL LOW (ref 12.0–15.0)
MCH: 28.5 pg (ref 26.0–34.0)
MCH: 29.4 pg (ref 26.0–34.0)
MCHC: 34.3 g/dL (ref 30.0–36.0)
MCHC: 35.2 g/dL (ref 30.0–36.0)
MCV: 83 fL (ref 78.0–100.0)
MCV: 83.3 fL (ref 78.0–100.0)
PLATELETS: 32 10*3/uL — AB (ref 150–400)
PLATELETS: 30 10*3/uL — AB (ref 150–400)
RBC: 2.53 MIL/uL — AB (ref 3.87–5.11)
RBC: 2.93 MIL/uL — ABNORMAL LOW (ref 3.87–5.11)
RDW: 14.9 % (ref 11.5–15.5)
RDW: 14.9 % (ref 11.5–15.5)
WBC: 19.8 10*3/uL — AB (ref 4.0–10.5)
WBC: 26.7 10*3/uL — ABNORMAL HIGH (ref 4.0–10.5)

## 2016-02-14 LAB — PREPARE FRESH FROZEN PLASMA
UNIT DIVISION: 0
Unit division: 0

## 2016-02-14 LAB — PREPARE PLATELET PHERESIS: UNIT DIVISION: 0

## 2016-02-14 LAB — COMPREHENSIVE METABOLIC PANEL
ALK PHOS: 72 U/L (ref 38–126)
ALT: 493 U/L — AB (ref 14–54)
AST: 597 U/L — AB (ref 15–41)
Albumin: 1.8 g/dL — ABNORMAL LOW (ref 3.5–5.0)
Anion gap: 15 (ref 5–15)
BUN: 29 mg/dL — AB (ref 6–20)
CALCIUM: 5.3 mg/dL — AB (ref 8.9–10.3)
CO2: 23 mmol/L (ref 22–32)
CREATININE: 1.92 mg/dL — AB (ref 0.44–1.00)
Chloride: 91 mmol/L — ABNORMAL LOW (ref 101–111)
GFR, EST AFRICAN AMERICAN: 39 mL/min — AB (ref >60)
GFR, EST NON AFRICAN AMERICAN: 33 mL/min — AB (ref >60)
Glucose, Bld: 221 mg/dL — ABNORMAL HIGH (ref 65–99)
Potassium: 3.8 mmol/L (ref 3.5–5.1)
Sodium: 129 mmol/L — ABNORMAL LOW (ref 135–145)
Total Bilirubin: 9 mg/dL — ABNORMAL HIGH (ref 0.3–1.2)
Total Protein: 4 g/dL — ABNORMAL LOW (ref 6.5–8.1)

## 2016-02-14 LAB — PREPARE RBC (CROSSMATCH)

## 2016-02-14 MED ORDER — SODIUM CHLORIDE 0.9 % IV SOLN
1.0000 g | Freq: Once | INTRAVENOUS | Status: AC
  Administered 2016-02-14: 1 g via INTRAVENOUS
  Filled 2016-02-14: qty 10

## 2016-02-14 MED ORDER — SODIUM CHLORIDE 0.9 % IV SOLN
Freq: Once | INTRAVENOUS | Status: AC
  Administered 2016-02-14: 10 mL/h via INTRAVENOUS

## 2016-02-14 MED ORDER — SODIUM CHLORIDE 0.9 % IV SOLN
2.0000 g | Freq: Once | INTRAVENOUS | Status: AC
  Administered 2016-02-14: 2 g via INTRAVENOUS
  Filled 2016-02-14: qty 20

## 2016-02-14 MED ORDER — SODIUM CHLORIDE 0.9 % IV SOLN: Freq: Once | INTRAVENOUS | Status: DC

## 2016-02-14 MED ORDER — PIVOT 1.5 CAL PO LIQD
1000.0000 mL | ORAL | Status: DC
  Administered 2016-02-14: 1000 mL

## 2016-02-14 NOTE — Progress Notes (Addendum)
Patient ID: [name] Bell, female   DOB: [birth year], 35 y.o.   MRN: [id number] Follow up - Trauma Critical Care  Patient Details:    [name] Bell is an 35 y.o. female.  Lines/tubes : Airway 7.5 mm (Active)  Secured at (cm) 25 cm 02/14/2016  6:42 AM  Measured From Lips 02/14/2016  6:42 AM  Secured Location Right 02/14/2016  3:38 AM  Secured By [employer] 02/14/2016  3:38 AM  Tube Holder Repositioned Yes 02/14/2016  3:38 AM  Cuff Pressure (cm H2O) 28 cm H2O 02/14/2016  3:38 AM  Site Condition Dry 02/14/2016  3:38 AM     CVC Triple Lumen 02/09/16 Right Femoral (Active)  Indication for Insertion or Continuance of Line Vasoactive infusions 02/13/2016  7:15 PM  Site Assessment Intact 02/13/2016  7:15 PM  Proximal Lumen Status Infusing 02/13/2016  7:15 PM  Medial Flushed;Saline locked;Capped (Central line) 02/13/2016  7:15 PM  Distal Lumen Status Flushed;Saline locked;Capped (Central line) 02/13/2016  7:15 PM  Dressing Type Transparent;Occlusive;Gauze 02/13/2016  7:15 PM  Dressing Status Clean;Dry;Intact;New drainage 02/13/2016  7:15 PM  Line Care Connections checked and tightened 02/13/2016  7:15 PM  Dressing Intervention New dressing 02/10/2016  9:00 PM  Dressing Change Due 02/20/16 02/13/2016  7:15 PM     Arterial Line 02/09/16 Right Radial (Active)  Site Assessment Clean;Dry;Intact 02/13/2016  7:15 PM  Line Status Pulsatile blood flow 02/13/2016  7:15 PM  Art Line Waveform Appropriate 02/13/2016  7:15 PM  Art Line Interventions Zeroed and calibrated;Leveled;Connections checked and tightened;Flushed per protocol;Line pulled back 02/13/2016  7:15 PM  Color/Movement/Sensation Capillary refill less than 3 sec 02/13/2016  7:15 PM  Dressing Type Transparent 02/13/2016  7:15 PM  Dressing Status Clean;Intact;Dry 02/13/2016  7:15 PM  Dressing Change Due 02/20/16 02/13/2016  7:15 PM     Chest Tube 1 Right Pleural 32 Fr. (Active)  Suction -20 cm H2O 02/14/2016  6:42 AM  Chest Tube Air Leak None 02/14/2016  6:42 AM  Patency  Intervention Milked;Tip/tilt 02/14/2016  6:42 AM  Drainage Description Bright red 02/14/2016  4:00 AM  Dressing Status Clean;Dry;Intact 02/14/2016  6:42 AM  Dressing Intervention Dressing reinforced 02/14/2016  6:42 AM  Site Assessment Bleeding 02/14/2016  2:00 AM  Surrounding Skin Unable to view 02/14/2016  6:42 AM  Output (mL) 50 mL 02/14/2016  6:42 AM     Chest Tube 1 Right Pleural 32 Fr. (Active)  Suction -20 cm H2O 02/14/2016  6:42 AM  Chest Tube Air Leak None 02/14/2016  6:42 AM  Patency Intervention Milked;Tip/tilt 02/14/2016  6:42 AM  Drainage Description Bright red 02/14/2016  4:00 AM  Dressing Status Clean;Dry;Intact 02/14/2016  6:42 AM  Dressing Intervention Dressing reinforced 02/14/2016  6:42 AM  Site Assessment Bleeding 02/14/2016  2:00 AM  Surrounding Skin Unable to view 02/14/2016  6:42 AM  Output (mL) 40 mL 02/14/2016  6:42 AM     Negative Pressure Wound Therapy Abdomen (Active)  Last dressing change 02/13/16 02/14/2016  6:42 AM  Site / Wound Assessment Dressing in place / Unable to assess 02/14/2016  6:42 AM  Peri-wound Assessment Bleeding 02/13/2016  7:00 AM  Cycle Continuous 02/14/2016  6:42 AM  Target Pressure (mmHg) 125 02/14/2016  6:42 AM  Canister Changed Yes 02/14/2016  7:15 AM  Dressing Status Intact 02/14/2016  4:00 AM  Drainage Amount Copious 02/13/2016  7:00 AM  Drainage Description Sanguineous 02/13/2016  7:00 AM  Output (mL) 75 mL 02/14/2016  7:15 AM     NG/OG  Tube Orogastric 16 Fr. Center mouth (Active)  Placement Verification Auscultation 02/14/2016  4:00 AM  Site Assessment Clean;Dry;Intact 02/14/2016  4:00 AM  Status Infusing tube feed 02/14/2016  4:00 AM  Amount of suction 116 mmHg 02/12/2016  8:00 PM  Drainage Appearance Serosanguineous 02/13/2016  8:00 AM     Urethral Catheter S. Bowman,RN Latex 16 Fr. (Active)  Indication for Insertion or Continuance of Catheter Unstable critical patients (first 24-48 hours) 02/13/2016  7:15 PM  Site Assessment Bleeding;Intact 02/13/2016  7:15 PM  Catheter Maintenance  Bag below level of bladder;Catheter secured;Drainage bag/tubing not touching floor;Insertion date on drainage bag;No dependent loops;Seal intact 02/13/2016  7:15 PM  Collection Container Standard drainage bag 02/13/2016  7:15 PM  Securement Method Securing device (Describe) 02/13/2016  7:15 PM  Urinary Catheter Interventions Unclamped 02/13/2016  7:15 PM  Output (mL) 100 mL 02/14/2016  6:42 AM    Microbiology/Sepsis markers: Results for orders placed or performed during the hospital encounter of 02/09/16  MRSA PCR Screening     Status: None   Collection Time: 02/11/16 11:30 AM  Result Value Ref Range Status   MRSA by PCR NEGATIVE NEGATIVE Final    Comment:        The GeneXpert MRSA Assay (FDA approved for NASAL specimens only), is one component of a comprehensive MRSA colonization surveillance program. It is not intended to diagnose MRSA infection nor to guide or monitor treatment for MRSA infections.     Anti-infectives:  Anti-infectives    Start     Dose/Rate Route Frequency Ordered Stop   02/11/16 0600  piperacillin-tazobactam (ZOSYN) IVPB 3.375 g     3.375 g 100 mL/hr over 30 Minutes Intravenous 4 times per day 02/10/16 2214     02/10/16 1730  piperacillin-tazobactam (ZOSYN) IVPB 3.375 g  Status:  Discontinued     3.375 g 12.5 mL/hr over 240 Minutes Intravenous 3 times per day 02/10/16 1727 02/10/16 2214      Best Practice/Protocols:  VTE Prophylaxis: Mechanical Continous Sedation  Consults: Treatment Team:  Myrene Galas, MD   Subjective:    Overnight Issues: weaning pressors  Objective:  Vital signs for last 24 hours: Temp:  [97.3 F (36.3 C)-98.4 F (36.9 C)] 97.3 F (36.3 C) (04/08 0400) Pulse Rate:  [35-241] 101 (04/08 0700) Resp:  [0-35] 0 (04/08 0700) BP: (74-162)/(7-93) 84/57 mmHg (04/08 0700) SpO2:  [89 %-100 %] 94 % (04/08 0700) Arterial Line BP: (92-155)/(53-75) 106/55 mmHg (04/08 0700) FiO2 (%):  [60 %-70 %] 60 % (04/08 0500) Weight:  [155.8 kg (343  lb 7.6 oz)] 155.8 kg (343 lb 7.6 oz) (04/08 0500)  Hemodynamic parameters for last 24 hours: CVP:  [3 mmHg-33 mmHg] 17 mmHg  Intake/Output from previous day: 04/07 0701 - 04/08 0700 In: 9105.1 [I.V.:7501.4; Blood:1030; NG/GT:143.7; IV Piggyback:430] Out: 3190 [Urine:600; Drains:2300; Blood:50; Chest Tube:240]  Intake/Output this shift: Total I/O In: 62.7 [I.V.:62.7] Out: 75 [Drains:75]  Vent settings for last 24 hours: Vent Mode:  [-] PRVC FiO2 (%):  [60 %-70 %] 60 % Set Rate:  [35 bmp] 35 bmp Vt Set:  [430 mL] 430 mL PEEP:  [15 cmH20] 15 cmH20 Plateau Pressure:  [34 cmH20-38 cmH20] 34 cmH20  Physical Exam:  General: on vent Neuro: sedated HEENT/Neck: ETT Resp: coarse but equal CVS: RRR GI: open abd VAC, fluid from pin sites on ex fix Extremities: sig edema RLE>LLE  Results for orders placed or performed during the hospital encounter of 02/09/16 (from the past 24 hour(s))  Prepare RBC  Status: None   Collection Time: 02/13/16  8:00 AM  Result Value Ref Range   Order Confirmation ORDER PROCESSED BY BLOOD BANK   CBC     Status: Abnormal   Collection Time: 02/13/16  4:20 PM  Result Value Ref Range   WBC 16.3 (H) 4.0 - 10.5 K/uL   RBC 3.23 (L) 3.87 - 5.11 MIL/uL   Hemoglobin 9.0 (L) 12.0 - 15.0 g/dL   HCT 16.127.1 (L) 09.636.0 - 04.546.0 %   MCV 83.9 78.0 - 100.0 fL   MCH 27.9 26.0 - 34.0 pg   MCHC 33.2 30.0 - 36.0 g/dL   RDW 40.914.8 81.111.5 - 91.415.5 %   Platelets 23 (LL) 150 - 400 K/uL  Protime-INR     Status: Abnormal   Collection Time: 02/13/16  4:20 PM  Result Value Ref Range   Prothrombin Time 20.4 (H) 11.6 - 15.2 seconds   INR 1.75 (H) 0.00 - 1.49  I-STAT 3, arterial blood gas (G3+)     Status: Abnormal   Collection Time: 02/13/16  4:24 PM  Result Value Ref Range   pH, Arterial 7.402 7.350 - 7.450   pCO2 arterial 42.5 35.0 - 45.0 mmHg   pO2, Arterial 158.0 (H) 80.0 - 100.0 mmHg   Bicarbonate 26.7 (H) 20.0 - 24.0 mEq/L   TCO2 28 0 - 100 mmol/L   O2 Saturation 99.0 %    Acid-Base Excess 1.0 0.0 - 2.0 mmol/L   Patient temperature 96.7 F    Collection site RADIAL, ALLEN'S TEST ACCEPTABLE    Drawn by RT    Sample type ARTERIAL   Hemoglobin and hematocrit, blood     Status: Abnormal   Collection Time: 02/14/16  3:00 AM  Result Value Ref Range   Hemoglobin 7.5 (L) 12.0 - 15.0 g/dL   HCT 78.222.0 (L) 95.636.0 - 21.346.0 %  I-STAT 3, arterial blood gas (G3+)     Status: Abnormal   Collection Time: 02/14/16  4:03 AM  Result Value Ref Range   pH, Arterial 7.515 (H) 7.350 - 7.450   pCO2 arterial 35.3 35.0 - 45.0 mmHg   pO2, Arterial 178.0 (H) 80.0 - 100.0 mmHg   Bicarbonate 28.6 (H) 20.0 - 24.0 mEq/L   TCO2 30 0 - 100 mmol/L   O2 Saturation 100.0 %   Acid-Base Excess 5.0 (H) 0.0 - 2.0 mmol/L   Patient temperature 97.4 F    Collection site ARTERIAL LINE    Drawn by RT    Sample type ARTERIAL   CBC     Status: Abnormal   Collection Time: 02/14/16  4:51 AM  Result Value Ref Range   WBC 19.8 (H) 4.0 - 10.5 K/uL   RBC 2.53 (L) 3.87 - 5.11 MIL/uL   Hemoglobin 7.2 (L) 12.0 - 15.0 g/dL   HCT 08.621.0 (L) 57.836.0 - 46.946.0 %   MCV 83.0 78.0 - 100.0 fL   MCH 28.5 26.0 - 34.0 pg   MCHC 34.3 30.0 - 36.0 g/dL   RDW 62.914.9 52.811.5 - 41.315.5 %   Platelets 32 (L) 150 - 400 K/uL  Comprehensive metabolic panel     Status: Abnormal   Collection Time: 02/14/16  4:51 AM  Result Value Ref Range   Sodium 129 (L) 135 - 145 mmol/L   Potassium 3.8 3.5 - 5.1 mmol/L   Chloride 91 (L) 101 - 111 mmol/L   CO2 23 22 - 32 mmol/L   Glucose, Bld 221 (H) 65 - 99 mg/dL   BUN 29 (  H) 6 - 20 mg/dL   Creatinine, Ser 1.61 (H) 0.44 - 1.00 mg/dL   Calcium 5.3 (LL) 8.9 - 10.3 mg/dL   Total Protein 4.0 (L) 6.5 - 8.1 g/dL   Albumin 1.8 (L) 3.5 - 5.0 g/dL   AST 096 (H) 15 - 41 U/L   ALT 493 (H) 14 - 54 U/L   Alkaline Phosphatase 72 38 - 126 U/L   Total Bilirubin 9.0 (H) 0.3 - 1.2 mg/dL   GFR calc non Af Amer 33 (L) >60 mL/min   GFR calc Af Amer 39 (L) >60 mL/min   Anion gap 15 5 - 15  Prepare RBC     Status: None    Collection Time: 02/14/16  7:48 AM  Result Value Ref Range   Order Confirmation ORDER PROCESSED BY BLOOD BANK     Assessment & Plan: Present on Admission:  . Fall from, out of or through building, not otherwise specified, initial encounter . Hypovolemic shock (HCC)   LOS: 5 days   Additional comments:I reviewed the patient's new clinical lab test results. and CXR Jump from hotel/multiple toxic ingestions S/P ex lap, hepatorraphy, SBR, cholecystectomy, preperitoneal pelvic packing [name] 4/3  Ex lap for acidosis/bleeding 4/4 Rehmat Murtagh Ex lap for bleeding 4/5 [name] Possible celiac artery intimal injury - bowel viable on explorations S/P ex fix pelvis [name] 4/3 Resp failure - full support, 60%, decrease PEEP to 10, decrease RR to 30, ABG 1400 Mult B rib FX/R HPTX, B pulm contisons - sig improvement R effusion Multiple toxic ingestions - S/P EGD by GI, no esophageal injury seen. Acetaminophen level neg. No need for n acetylcystine per GI. Tylenol ingestion still likely caused liver damage. Persistent hypocalcemia may be due to chelation. Mult L wrist FXs - splint for now Hepatic failure - hemorrhagic shock plus ingestion of toxins, transaminases a bit lower but bili up to 9 AKI - U/O decreased but CRT down a bit,  CV - steroids for possible adrenal insufficiency, pressors weaned off except vaso for now ABL anemia - TF 2u PRBC now Thrombocytopenia - consumptive, TF PLTs Pelvic FX - S/P ex fix and angioembolization FEN - hypocalcemia treated further, increase TF to 20cc/h VTE - PAS  Dispo - ICU Critical Care Total Time*: 1h  [name], MD, MPH, FACS Trauma: [phone] General Surgery: [phone]  02/14/2016  *Care during the described time interval was provided by me. I have reviewed this patient's available data, including medical history, events of note, physical examination and test results as part of my evaluation.

## 2016-02-14 NOTE — Progress Notes (Signed)
Nutrition Follow-up  DOCUMENTATION CODES:   Obesity unspecified  INTERVENTION:   MD increasing TF as tolerated  Recommended Goal: Pivot 1.5 @ 30 ml/hr with 60 ml Prostat TID Provides: 1680 kcal, 157 grams protein, and 546 ml H2O.   NUTRITION DIAGNOSIS:   Increased nutrient needs related to wound healing, catabolic illness as evidenced by estimated needs. Ongoing.   GOAL:   Provide needs based on ASPEN/SCCM guidelines Progressing.   MONITOR:   Skin, I & O's, Vent status, Labs  REASON FOR ASSESSMENT:   Consult Enteral/tube feeding initiation and management  ASSESSMENT:   Pt from DC with no past medical hx admitted after suicide attempt by ingestion of Drano, 2 bottles of acetaminophen, nyquil, and possibly 6 alprazolam pills and jumping from 5th floor balcony of hotel. 4/3 pt is s/p exp lap, hepatorraphy, SBR, cholecystectomy, preperitoneal pelvic packing, ex fix pelvis, R HPTX, Bil pulmonary contusions. Plan for repeat OR 4/5 for another possible SBR. Abdomen remains open.   Patient is currently intubated on ventilator support MV: 15.2 L/min Temp (24hrs), Avg:97.2 F (36.2 C), Min:96.4 F (35.8 C), Max:98.3 F (36.8 C)  4/7 s/p exp lap, abd VAC dressing change 4/7 Pivot 1.5 started @ 10 ml/hr 4/8 Pivot 1.5 increased to 20 ml/hr Off all pressors as of this afternoon! MAP 63-89, currently 66 Output:  Negative Pressure Wound (VAC): 2300 ml 4/7 Chest Tubes: 240 ml 4/7 Weight has increased by 62 lb since admission, pt is positive 48 L  Medications reviewed and include: solucortef Labs reviewed: sodium low 129, calcium 5.3, AST 597, ALT 493 OG tube  Diet Order:  Diet NPO time specified  Skin:  Wound (see comment) (lacerations,abrasions,surgical incisions,VAC)  Last BM:  unknown  Height:   Ht Readings from Last 1 Encounters:  02/09/16 5\' 11"  (1.803 m)    Weight:   Wt Readings from Last 1 Encounters:  02/14/16 343 lb 7.6 oz (155.8 kg)    Ideal Body  Weight:  70.4 kg  BMI:  Body mass index is 47.93 kg/(m^2).  Estimated Nutritional Needs:   Kcal:  1406-1790  Protein:  >/= 140 grams  Fluid:  > 2 L/day  EDUCATION NEEDS:   No education needs identified at this time  Kendell BaneHeather Kareemah Grounds RD, LDN, CNSC [phone] Pager [phone] After Hours Pager

## 2016-02-14 NOTE — Progress Notes (Signed)
Patient ID: [name] Bell, female   DOB: Sep 10, 1981, 35 y.o.   MRN: [id number] I updated her sister at the bedside. [name] Uvaldo Rybacki, MD, MPH, FACS Trauma: [phone] General Surgery: [phone]

## 2016-02-14 NOTE — Progress Notes (Signed)
Called Dr. Magnus IvanBlackman regarding calcium and bicarb lab result. Orders given for 1 run Calcium gluconate. Will administer and continue to monitor.

## 2016-02-14 NOTE — Progress Notes (Signed)
CRITICAL VALUE ALERT  Critical value received:  5.3 Calcium  Date of notification:  04/08  Time of notification:  0545  Critical value read back:Yes.    Nurse who received alert:  Lily Peerhomas Lilley, RN  MD notified (1st page):  Magnus IvanBlackman (Trauma)  Time of first page:  [phone]  MD notified (2nd page):  Time of second page:  Responding MD:  Magnus IvanBlackman (Trauma)  Time MD responded:  [phone]

## 2016-02-15 ENCOUNTER — Inpatient Hospital Stay (HOSPITAL_COMMUNITY): Payer: Medicaid Other

## 2016-02-15 LAB — CBC
HCT: 23.2 % — ABNORMAL LOW (ref 36.0–46.0)
HEMATOCRIT: 24.3 % — AB (ref 36.0–46.0)
Hemoglobin: 8.3 g/dL — ABNORMAL LOW (ref 12.0–15.0)
Hemoglobin: 7.8 g/dL — ABNORMAL LOW (ref 12.0–15.0)
MCH: 28.5 pg (ref 26.0–34.0)
MCH: 28.7 pg (ref 26.0–34.0)
MCHC: 34.2 g/dL (ref 30.0–36.0)
MCHC: 33.6 g/dL (ref 30.0–36.0)
MCV: 83.5 fL (ref 78.0–100.0)
MCV: 85.3 fL (ref 78.0–100.0)
PLATELETS: 29 10*3/uL — AB (ref 150–400)
PLATELETS: 30 10*3/uL — AB (ref 150–400)
RBC: 2.91 MIL/uL — ABNORMAL LOW (ref 3.87–5.11)
RBC: 2.72 MIL/uL — AB (ref 3.87–5.11)
RDW: 15 % (ref 11.5–15.5)
RDW: 15.6 % — ABNORMAL HIGH (ref 11.5–15.5)
WBC: 30.6 10*3/uL — ABNORMAL HIGH (ref 4.0–10.5)
WBC: 35.6 10*3/uL — ABNORMAL HIGH (ref 4.0–10.5)

## 2016-02-15 LAB — BLOOD GAS, ARTERIAL
ACID-BASE EXCESS: 3.9 mmol/L — AB (ref 0.0–2.0)
Bicarbonate: 26.8 mEq/L — ABNORMAL HIGH (ref 20.0–24.0)
DRAWN BY: 398661
FIO2: 0.6
LHR: 24 {breaths}/min
MECHVT: 490 mL
O2 SAT: 98.7 %
PEEP/CPAP: 10 cmH2O
PH ART: 7.524 — AB (ref 7.350–7.450)
Patient temperature: 98.6
TCO2: 27.9 mmol/L (ref 0–100)
pCO2 arterial: 32.7 mmHg — ABNORMAL LOW (ref 35.0–45.0)
pO2, Arterial: 106 mmHg — ABNORMAL HIGH (ref 80.0–100.0)

## 2016-02-15 LAB — COMPREHENSIVE METABOLIC PANEL
ALT: 368 U/L — ABNORMAL HIGH (ref 14–54)
ANION GAP: 13 (ref 5–15)
AST: 324 U/L — ABNORMAL HIGH (ref 15–41)
Albumin: 1.8 g/dL — ABNORMAL LOW (ref 3.5–5.0)
Alkaline Phosphatase: 84 U/L (ref 38–126)
BILIRUBIN TOTAL: 11.5 mg/dL — AB (ref 0.3–1.2)
BUN: 39 mg/dL — AB (ref 6–20)
CHLORIDE: 91 mmol/L — AB (ref 101–111)
CO2: 26 mmol/L (ref 22–32)
Calcium: 5.8 mg/dL — CL (ref 8.9–10.3)
Creatinine, Ser: 1.88 mg/dL — ABNORMAL HIGH (ref 0.44–1.00)
GFR, EST AFRICAN AMERICAN: 40 mL/min — AB (ref >60)
GFR, EST NON AFRICAN AMERICAN: 34 mL/min — AB (ref >60)
Glucose, Bld: 157 mg/dL — ABNORMAL HIGH (ref 65–99)
POTASSIUM: 3.5 mmol/L (ref 3.5–5.1)
Sodium: 130 mmol/L — ABNORMAL LOW (ref 135–145)
Total Protein: 4.4 g/dL — ABNORMAL LOW (ref 6.5–8.1)

## 2016-02-15 LAB — PREPARE PLATELET PHERESIS: UNIT DIVISION: 0

## 2016-02-15 LAB — HEMOGLOBIN AND HEMATOCRIT, BLOOD
HEMATOCRIT: 24.5 % — AB (ref 36.0–46.0)
HEMATOCRIT: 23.6 % — AB (ref 36.0–46.0)
HEMOGLOBIN: 8.3 g/dL — AB (ref 12.0–15.0)
Hemoglobin: 7.9 g/dL — ABNORMAL LOW (ref 12.0–15.0)

## 2016-02-15 LAB — GLUCOSE, CAPILLARY
GLUCOSE-CAPILLARY: 146 mg/dL — AB (ref 65–99)
Glucose-Capillary: 138 mg/dL — ABNORMAL HIGH (ref 65–99)
Glucose-Capillary: 153 mg/dL — ABNORMAL HIGH (ref 65–99)
Glucose-Capillary: 157 mg/dL — ABNORMAL HIGH (ref 65–99)
Glucose-Capillary: 168 mg/dL — ABNORMAL HIGH (ref 65–99)

## 2016-02-15 MED ORDER — PIVOT 1.5 CAL PO LIQD
1000.0000 mL | ORAL | Status: DC
  Administered 2016-02-15 – 2016-03-15 (×24): 1000 mL
  Filled 2016-02-15 (×7): qty 1000

## 2016-02-15 MED ORDER — SODIUM CHLORIDE 0.9 % IV SOLN
2.0000 g | Freq: Once | INTRAVENOUS | Status: AC
  Administered 2016-02-15: 2 g via INTRAVENOUS
  Filled 2016-02-15: qty 20

## 2016-02-15 MED ORDER — SODIUM CHLORIDE 0.9% FLUSH
10.0000 mL | INTRAVENOUS | Status: DC | PRN
  Administered 2016-02-19: 10 mL
  Administered 2016-02-27: 20 mL
  Administered 2016-02-28 – 2016-03-19 (×4): 10 mL
  Administered 2016-03-19: 20 mL
  Administered 2016-04-07 – 2016-04-11 (×3): 10 mL
  Filled 2016-02-15 (×10): qty 40

## 2016-02-15 MED ORDER — SODIUM CHLORIDE 0.9% FLUSH
10.0000 mL | Freq: Two times a day (BID) | INTRAVENOUS | Status: DC
  Administered 2016-02-15 – 2016-02-22 (×12): 10 mL
  Administered 2016-02-22 – 2016-02-23 (×2): 20 mL
  Administered 2016-02-23 – 2016-02-25 (×5): 10 mL
  Administered 2016-02-26: 20 mL
  Administered 2016-02-27 – 2016-02-28 (×2): 10 mL
  Administered 2016-02-28 – 2016-02-29 (×2): 20 mL
  Administered 2016-03-01: 10 mL
  Administered 2016-03-01: 20 mL
  Administered 2016-03-02 – 2016-03-03 (×4): 10 mL
  Administered 2016-03-04: 40 mL
  Administered 2016-03-05 (×2): 10 mL
  Administered 2016-03-06: 20 mL
  Administered 2016-03-07 – 2016-03-08 (×3): 10 mL
  Administered 2016-03-09: 20 mL
  Administered 2016-03-10 – 2016-03-14 (×8): 10 mL
  Administered 2016-03-15: 20 mL
  Administered 2016-03-15: 10 mL
  Administered 2016-03-16 (×2): 20 mL
  Administered 2016-03-17 – 2016-04-03 (×34): 10 mL
  Administered 2016-04-03: 20 mL
  Administered 2016-04-04 – 2016-04-19 (×15): 10 mL

## 2016-02-15 MED ORDER — PRO-STAT SUGAR FREE PO LIQD
60.0000 mL | Freq: Three times a day (TID) | ORAL | Status: DC
  Administered 2016-02-15 – 2016-03-16 (×86): 60 mL
  Filled 2016-02-15 (×86): qty 60

## 2016-02-15 MED ORDER — INSULIN ASPART 100 UNIT/ML ~~LOC~~ SOLN
0.0000 [IU] | SUBCUTANEOUS | Status: DC
  Administered 2016-02-15: 3 [IU] via SUBCUTANEOUS
  Administered 2016-02-15 (×4): 4 [IU] via SUBCUTANEOUS
  Administered 2016-02-16 (×2): 3 [IU] via SUBCUTANEOUS
  Administered 2016-02-16: 4 [IU] via SUBCUTANEOUS
  Administered 2016-02-16: 3 [IU] via SUBCUTANEOUS
  Administered 2016-02-17 (×5): 4 [IU] via SUBCUTANEOUS
  Administered 2016-02-17: 3 [IU] via SUBCUTANEOUS
  Administered 2016-02-17: 4 [IU] via SUBCUTANEOUS
  Administered 2016-02-18 (×2): 3 [IU] via SUBCUTANEOUS
  Administered 2016-02-19: 4 [IU] via SUBCUTANEOUS
  Administered 2016-02-19 (×3): 3 [IU] via SUBCUTANEOUS
  Administered 2016-02-19: 4 [IU] via SUBCUTANEOUS
  Administered 2016-02-20: 3 [IU] via SUBCUTANEOUS
  Administered 2016-02-20 (×3): 4 [IU] via SUBCUTANEOUS
  Administered 2016-02-20 – 2016-02-29 (×33): 3 [IU] via SUBCUTANEOUS
  Administered 2016-02-29 (×2): 4 [IU] via SUBCUTANEOUS
  Administered 2016-02-29 – 2016-03-01 (×4): 3 [IU] via SUBCUTANEOUS
  Administered 2016-03-02: 4 [IU] via SUBCUTANEOUS
  Administered 2016-03-02 (×5): 3 [IU] via SUBCUTANEOUS
  Administered 2016-03-03: 4 [IU] via SUBCUTANEOUS
  Administered 2016-03-03: 3 [IU] via SUBCUTANEOUS
  Administered 2016-03-03 (×2): 4 [IU] via SUBCUTANEOUS
  Administered 2016-03-03: 3 [IU] via SUBCUTANEOUS
  Administered 2016-03-03: 4 [IU] via SUBCUTANEOUS
  Administered 2016-03-04: 3 [IU] via SUBCUTANEOUS
  Administered 2016-03-04 (×5): 4 [IU] via SUBCUTANEOUS
  Administered 2016-03-05: 3 [IU] via SUBCUTANEOUS
  Administered 2016-03-05 (×2): 4 [IU] via SUBCUTANEOUS
  Administered 2016-03-05 (×2): 3 [IU] via SUBCUTANEOUS
  Administered 2016-03-06: 4 [IU] via SUBCUTANEOUS
  Administered 2016-03-06 (×3): 3 [IU] via SUBCUTANEOUS
  Administered 2016-03-06: 7 [IU] via SUBCUTANEOUS
  Administered 2016-03-06 – 2016-03-10 (×19): 3 [IU] via SUBCUTANEOUS
  Administered 2016-03-10: 4 [IU] via SUBCUTANEOUS
  Administered 2016-03-10 – 2016-03-11 (×5): 3 [IU] via SUBCUTANEOUS
  Administered 2016-03-12: 4 [IU] via SUBCUTANEOUS
  Administered 2016-03-12 – 2016-03-18 (×16): 3 [IU] via SUBCUTANEOUS
  Administered 2016-03-18: 4 [IU] via SUBCUTANEOUS
  Administered 2016-03-18 – 2016-03-24 (×18): 3 [IU] via SUBCUTANEOUS
  Administered 2016-03-24: 4 [IU] via SUBCUTANEOUS
  Administered 2016-03-24: 3 [IU] via SUBCUTANEOUS
  Administered 2016-03-25: 4 [IU] via SUBCUTANEOUS
  Administered 2016-03-25 – 2016-03-26 (×9): 3 [IU] via SUBCUTANEOUS
  Administered 2016-03-27: 1 [IU] via SUBCUTANEOUS
  Administered 2016-03-28 – 2016-04-01 (×9): 3 [IU] via SUBCUTANEOUS

## 2016-02-15 NOTE — Progress Notes (Signed)
Patient ID: [name] Bell, female   DOB: [birth year], 35 y.o.   MRN: [id number] Follow up - Trauma Critical Care  Patient Details:    [name] Bell is an 35 y.o. female.  Lines/tubes : Airway 7.5 mm (Active)  Secured at (cm) 26 cm 02/15/2016  4:22 AM  Measured From Lips 02/15/2016  4:22 AM  Secured Location Left 02/15/2016  4:22 AM  Secured By [employer] 02/15/2016  4:22 AM  Tube Holder Repositioned Yes 02/15/2016  4:22 AM  Cuff Pressure (cm H2O) 26 cm H2O 02/15/2016  4:22 AM  Site Condition Dry 02/15/2016  4:22 AM     CVC Triple Lumen 02/09/16 Right Femoral (Active)  Indication for Insertion or Continuance of Line Vasoactive infusions 02/14/2016  8:00 PM  Site Assessment Intact 02/14/2016  8:00 PM  Proximal Lumen Status Infusing;Flushed 02/14/2016  8:00 PM  Medial Flushed;Saline locked 02/14/2016  8:00 PM  Distal Lumen Status Infusing;Flushed 02/14/2016  8:00 PM  Dressing Type Transparent;Occlusive;Gauze 02/14/2016  8:00 PM  Dressing Status Clean;Dry;Intact;New drainage 02/14/2016  8:00 PM  Line Care Connections checked and tightened 02/14/2016  8:00 PM  Dressing Intervention Dressing changed;New dressing 02/14/2016  7:00 PM  Dressing Change Due 02/21/16 02/14/2016  8:00 PM     Chest Tube 1 Right Pleural 32 Fr. (Active)  Suction -20 cm H2O 02/14/2016  8:00 PM  Chest Tube Air Leak None 02/14/2016  8:00 PM  Patency Intervention Milked 02/14/2016  8:00 PM  Drainage Description Bright red 02/14/2016  8:00 PM  Dressing Status Clean;Dry;Intact 02/14/2016  8:00 PM  Dressing Intervention Dressing reinforced 02/14/2016  8:00 PM  Site Assessment Bleeding 02/14/2016  8:00 AM  Surrounding Skin Unable to view 02/14/2016  8:00 PM  Output (mL) 20 mL 02/15/2016  7:00 AM     Chest Tube 1 Right Pleural 32 Fr. (Active)  Suction -20 cm H2O 02/14/2016  8:00 PM  Chest Tube Air Leak None 02/14/2016  8:00 PM  Patency Intervention Milked 02/14/2016  8:00 PM  Drainage Description Bright red 02/14/2016  8:00 AM  Dressing Status  Clean;Dry;Intact 02/14/2016  8:00 PM  Dressing Intervention Dressing reinforced 02/14/2016  8:00 AM  Site Assessment Bleeding 02/14/2016  8:00 AM  Surrounding Skin Unable to view 02/14/2016  8:00 PM  Output (mL) 90 mL 02/15/2016  7:00 AM     Negative Pressure Wound Therapy Abdomen (Active)  Last dressing change 02/13/16 02/14/2016  8:00 PM  Site / Wound Assessment Dressing in place / Unable to assess 02/14/2016  8:00 PM  Peri-wound Assessment Bleeding 02/14/2016  8:00 PM  Cycle Continuous 02/14/2016  8:00 PM  Target Pressure (mmHg) 125 02/14/2016  8:00 PM  Canister Changed Yes 02/14/2016  7:00 PM  Dressing Status Intact 02/14/2016  8:00 PM  Drainage Amount Moderate 02/14/2016  8:00 PM  Drainage Description Serosanguineous 02/14/2016  8:00 PM  Output (mL) 475 mL 02/15/2016  4:00 AM     NG/OG Tube Orogastric 16 Fr. Center mouth (Active)  Placement Verification Auscultation 02/14/2016  8:00 PM  Site Assessment Clean;Dry;Intact 02/14/2016  8:00 PM  Status Infusing tube feed 02/14/2016  8:00 PM  Amount of suction 116 mmHg 02/12/2016  8:00 PM  Drainage Appearance Serosanguineous 02/13/2016  8:00 AM     Urethral Catheter S. Bowman,RN Latex 16 Fr. (Active)  Indication for Insertion or Continuance of Catheter Unstable critical patients (first 24-48 hours) 02/14/2016  8:00 PM  Site Assessment Intact 02/14/2016  8:00 PM  Catheter Maintenance Bag below level of bladder;Insertion  date on drainage bag;Catheter secured;Drainage bag/tubing not touching floor;No dependent loops;Seal intact 02/14/2016  8:00 PM  Collection Container Standard drainage bag 02/14/2016  8:00 PM  Securement Method Securing device (Describe) 02/14/2016  8:00 PM  Urinary Catheter Interventions Unclamped 02/14/2016  8:00 PM  Output (mL) 150 mL 02/15/2016  6:47 AM    Microbiology/Sepsis markers: Results for orders placed or performed during the hospital encounter of 02/09/16  MRSA PCR Screening     Status: None   Collection Time: 02/11/16 11:30 AM  Result Value Ref Range  Status   MRSA by PCR NEGATIVE NEGATIVE Final    Comment:        The GeneXpert MRSA Assay (FDA approved for NASAL specimens only), is one component of a comprehensive MRSA colonization surveillance program. It is not intended to diagnose MRSA infection nor to guide or monitor treatment for MRSA infections.     Anti-infectives:  Anti-infectives    Start     Dose/Rate Route Frequency Ordered Stop   02/11/16 0600  piperacillin-tazobactam (ZOSYN) IVPB 3.375 g     3.375 g 100 mL/hr over 30 Minutes Intravenous 4 times per day 02/10/16 2214     02/10/16 1730  piperacillin-tazobactam (ZOSYN) IVPB 3.375 g  Status:  Discontinued     3.375 g 12.5 mL/hr over 240 Minutes Intravenous 3 times per day 02/10/16 1727 02/10/16 2214      Best Practice/Protocols:  VTE Prophylaxis: Mechanical Continous Sedation  Consults: Treatment Team:  Myrene GalasMichael [name], MD    Studies:CXR - 1. Support apparatus satisfactory. 2. Two right chest tubes in place with resolution of the pneumothorax identified yesterday. 3. Stable right apical loculated pleural effusion/hemothorax. 4. Worsening atelectasis in both lungs since yesterday.  Subjective:    Overnight Issues: off pressors since yesterday  Objective:  Vital signs for last 24 hours: Temp:  [96.4 F (35.8 C)-98.6 F (37 C)] 98.6 F (37 C) (04/09 0800) Pulse Rate:  [30-105] 94 (04/09 0715) Resp:  [0-30] 24 (04/09 0715) BP: (75-127)/(33-77) 104/60 mmHg (04/09 0715) SpO2:  [93 %-100 %] 100 % (04/09 0715) Arterial Line BP: (89-114)/(50-89) 92/89 mmHg (04/08 1945) FiO2 (%):  [40 %-60 %] 40 % (04/09 0422) Weight:  [156.7 kg (345 lb 7.4 oz)] 156.7 kg (345 lb 7.4 oz) (04/09 0500)  Hemodynamic parameters for last 24 hours: CVP:  [13 mmHg-24 mmHg] 15 mmHg  Intake/Output from previous day: 04/08 0701 - 04/09 0700 In: 2795.4 [I.V.:1362.1; Blood:800; NG/GT:363.3; IV Piggyback:270] Out: 3485 [Urine:1125; Drains:2150; Chest Tube:210]  Intake/Output  this shift:    Vent settings for last 24 hours: Vent Mode:  [-] PRVC FiO2 (%):  [40 %-60 %] 40 % Set Rate:  [24 bmp-30 bmp] 24 bmp Vt Set:  [490 mL] 490 mL PEEP:  [5 cmH20-10 cmH20] 5 cmH20 Plateau Pressure:  [25 cmH20-30 cmH20] 27 cmH20  Physical Exam:  General: on vent Neuro: arouses and F/C HEENT/Neck: ETT and less facial edema Resp: coarse but equal CVS: RRR GI: open abd VAC, serous drainage from pelvic pin sites Extremities: edema 4+  Results for orders placed or performed during the hospital encounter of 02/09/16 (from the past 24 hour(s))  Hemoglobin and hematocrit, blood     Status: Abnormal   Collection Time: 02/14/16  1:30 PM  Result Value Ref Range   Hemoglobin 8.6 (L) 12.0 - 15.0 g/dL   HCT 16.125.1 (L) 09.636.0 - 04.546.0 %  I-STAT 3, arterial blood gas (G3+)     Status: Abnormal   Collection Time: 02/14/16  1:36 PM  Result Value Ref Range   pH, Arterial 7.523 (H) 7.350 - 7.450   pCO2 arterial 31.8 (L) 35.0 - 45.0 mmHg   pO2, Arterial 75.0 (L) 80.0 - 100.0 mmHg   Bicarbonate 26.3 (H) 20.0 - 24.0 mEq/L   TCO2 27 0 - 100 mmol/L   O2 Saturation 97.0 %   Acid-Base Excess 3.0 (H) 0.0 - 2.0 mmol/L   Patient temperature 97.1 F    Collection site RADIAL, ALLEN'S TEST ACCEPTABLE    Drawn by RT    Sample type ARTERIAL   CBC     Status: Abnormal   Collection Time: 02/14/16  8:32 PM  Result Value Ref Range   WBC 26.7 (H) 4.0 - 10.5 K/uL   RBC 2.93 (L) 3.87 - 5.11 MIL/uL   Hemoglobin 8.6 (L) 12.0 - 15.0 g/dL   HCT 16.1 (L) 09.6 - 04.5 %   MCV 83.3 78.0 - 100.0 fL   MCH 29.4 26.0 - 34.0 pg   MCHC 35.2 30.0 - 36.0 g/dL   RDW 40.9 81.1 - 91.4 %   Platelets 30 (L) 150 - 400 K/uL  Hemoglobin and hematocrit, blood     Status: Abnormal   Collection Time: 02/14/16 10:21 PM  Result Value Ref Range   Hemoglobin 8.4 (L) 12.0 - 15.0 g/dL   HCT 78.2 (L) 95.6 - 21.3 %  Glucose, capillary     Status: Abnormal   Collection Time: 02/15/16  3:19 AM  Result Value Ref Range    Glucose-Capillary 138 (H) 65 - 99 mg/dL  Blood gas, arterial     Status: Abnormal   Collection Time: 02/15/16  4:38 AM  Result Value Ref Range   FIO2 0.60    Delivery systems VENTILATOR    Mode PRESSURE REGULATED VOLUME CONTROL    VT 490 mL   LHR 24 resp/min   Peep/cpap 10.0 cm H20   pH, Arterial 7.524 (H) 7.350 - 7.450   pCO2 arterial 32.7 (L) 35.0 - 45.0 mmHg   pO2, Arterial 106 (H) 80.0 - 100.0 mmHg   Bicarbonate 26.8 (H) 20.0 - 24.0 mEq/L   TCO2 27.9 0 - 100 mmol/L   Acid-Base Excess 3.9 (H) 0.0 - 2.0 mmol/L   O2 Saturation 98.7 %   Patient temperature 98.6    Collection site RIGHT RADIAL    Drawn by [id number]    Sample type ARTERIAL    Allens test (pass/fail) PASS PASS  CBC     Status: Abnormal (Preliminary result)   Collection Time: 02/15/16  6:00 AM  Result Value Ref Range   WBC 30.6 (H) 4.0 - 10.5 K/uL   RBC 2.91 (L) 3.87 - 5.11 MIL/uL   Hemoglobin 8.3 (L) 12.0 - 15.0 g/dL   HCT 46.9 (L) 62.9 - 52.8 %   MCV 83.5 78.0 - 100.0 fL   MCH 28.5 26.0 - 34.0 pg   MCHC 34.2 30.0 - 36.0 g/dL   RDW 41.3 24.4 - 01.0 %   Platelets PENDING 150 - 400 K/uL  Comprehensive metabolic panel     Status: Abnormal   Collection Time: 02/15/16  6:00 AM  Result Value Ref Range   Sodium 130 (L) 135 - 145 mmol/L   Potassium 3.5 3.5 - 5.1 mmol/L   Chloride 91 (L) 101 - 111 mmol/L   CO2 26 22 - 32 mmol/L   Glucose, Bld 157 (H) 65 - 99 mg/dL   BUN 39 (H) 6 - 20 mg/dL   Creatinine, Ser 2.72 (  H) 0.44 - 1.00 mg/dL   Calcium 5.8 (LL) 8.9 - 10.3 mg/dL   Total Protein 4.4 (L) 6.5 - 8.1 g/dL   Albumin 1.8 (L) 3.5 - 5.0 g/dL   AST 161 (H) 15 - 41 U/L   ALT 368 (H) 14 - 54 U/L   Alkaline Phosphatase 84 38 - 126 U/L   Total Bilirubin 11.5 (H) 0.3 - 1.2 mg/dL   GFR calc non Af Amer 34 (L) >60 mL/min   GFR calc Af Amer 40 (L) >60 mL/min   Anion gap 13 5 - 15  Glucose, capillary     Status: Abnormal   Collection Time: 02/15/16  7:34 AM  Result Value Ref Range   Glucose-Capillary 153 (H) 65 - 99  mg/dL    Assessment & Plan: Present on Admission:  . Fall from, out of or through building, not otherwise specified, initial encounter . Hypovolemic shock (HCC)   LOS: 6 days   Additional comments:I reviewed the patient's new clinical lab test results. and CXR Jump from hotel/multiple toxic ingestions S/P ex lap, hepatorraphy, SBR, cholecystectomy, preperitoneal pelvic packing [name] 4/3  Ex lap for acidosis/bleeding 4/4 Citlalli Weikel Ex lap for bleeding 4/5 [name] Possible celiac artery intimal injury - bowel viable on explorations S/P ex fix pelvis [name] 4/3 Resp failure - full support, decrease FiO2 and PEEP, decrease RR for overventilation Mult B rib FX/R HPTX, B pulm contisons - ssome improvement R effusion Multiple toxic ingestions - S/P EGD by GI, no esophageal injury seen. Acetaminophen level neg. No need for n acetylcystine per GI. Tylenol ingestion still likely caused liver damage. Persistent hypocalcemia may be due to chelation. Mult L wrist FXs - splint for now Hepatic failure - hemorrhagic shock plus ingestion of toxins, transaminases a bit lower but bili up to 11.5 - likely partly due to hematoma reabsorbtion - LFTs in AM with fractionation AKI - U/O decreased but CRT down a bit,  CV - steroids for possible adrenal insufficiency - continue today ABL anemia - F/U Hyperglycemia - SSI ID - afeb, WBC higher, possible steroid effect. Will try to get central lines out tomorrow Thrombocytopenia - consumptive Pelvic FX - S/P ex fix and angioembolization FEN - hypocalcemia treated further, check Cai, increase TF to goal plus prostat. PICC placement tomorrow and D/C other lines then. VTE - PAS  Dispo - ICU, back to OR tomorrow Critical Care Total Time*: 45 Minutes  [name], MD, MPH, FACS Trauma: [phone] General Surgery: [phone]  02/15/2016  *Care during the described time interval was provided by me. I have reviewed this patient's available data, including medical  history, events of note, physical examination and test results as part of my evaluation.

## 2016-02-15 NOTE — Progress Notes (Signed)
Peripherally Inserted Central Catheter/Midline Placement  The IV Nurse has discussed with the patient and/or persons authorized to consent for the patient, the purpose of this procedure and the potential benefits and risks involved with this procedure.  The benefits include less needle sticks, lab draws from the catheter and patient may be discharged home with the catheter.  Risks include, but not limited to, infection, bleeding, blood clot (thrombus formation), and puncture of an artery; nerve damage and irregular heat beat.  Alternatives to this procedure were also discussed. Consent signed by sister at bedside due to pt ventilated and sedated.  PICC/Midline Placement Documentation  PICC Triple Lumen 02/15/16 PICC Right Cephalic 55 cm 0 cm (Active)  Indication for Insertion or Continuance of Line Vasoactive infusions;Prolonged intravenous therapies;Limited venous access - need for IV therapy >5 days (PICC only);Poor Vasculature-patient has had multiple peripheral attempts or PIVs lasting less than 24 hours;Head or chest injuries (Tracheotomy, burns, open chest wounds) 02/15/2016  1:23 PM  Exposed Catheter (cm) 0 cm 02/15/2016  1:23 PM  Site Assessment Clean;Dry;Intact 02/15/2016  1:23 PM  Lumen #1 Status Flushed;Saline locked;Blood return noted 02/15/2016  1:23 PM  Lumen #2 Status Flushed;Saline locked;Blood return noted 02/15/2016  1:23 PM  Lumen #3 Status Flushed;Saline locked;Blood return noted 02/15/2016  1:23 PM  Dressing Type Transparent 02/15/2016  1:23 PM  Dressing Status Clean;Dry;Intact;Antimicrobial disc in place 02/15/2016  1:23 PM  Line Care Connections checked and tightened 02/15/2016  1:23 PM  Line Adjustment (NICU/IV Team Only) No 02/15/2016  1:23 PM  Dressing Intervention New dressing 02/15/2016  1:23 PM  Dressing Change Due 02/22/16 02/15/2016  1:23 PM       [name] Bell, [name] Bell 02/15/2016, 1:24 PM

## 2016-02-15 NOTE — Progress Notes (Signed)
Called Trauma MD to report critical Calcium of 5.8,  Pt's calcium is low everyday, they are aware, we do not need to call them to report Critical Calcium each am per Dr. Janee Mornhompson.

## 2016-02-15 NOTE — Progress Notes (Signed)
CRITICAL VALUE ALERT  Critical value received:  Cal 5.8   Date of notification:  02/15/2016  Time of notification:  0705  Critical value read back:Yes.    Nurse who received alert:  Emelia Salisburyarrie Briceida Rasberry, RN  MD notified (1st page):  Trauma [phone]-[phone]   Time of first page:  0722  MD notified (2nd page):  Time of second page:  Responding MD:  Dr. Janee Mornhompson   Time MD responded:  0730

## 2016-02-16 ENCOUNTER — Encounter (HOSPITAL_COMMUNITY): Payer: Self-pay | Admitting: General Surgery

## 2016-02-16 ENCOUNTER — Inpatient Hospital Stay (HOSPITAL_COMMUNITY): Payer: Medicaid Other | Admitting: Certified Registered Nurse Anesthetist

## 2016-02-16 ENCOUNTER — Inpatient Hospital Stay (HOSPITAL_COMMUNITY): Payer: Medicaid Other

## 2016-02-16 ENCOUNTER — Encounter (HOSPITAL_COMMUNITY): Admission: EM | Disposition: A | Discharge status: Rehabilitation Facility | Payer: Self-pay | Source: Home / Self Care

## 2016-02-16 HISTORY — PX: SACRO-ILIAC PINNING: SHX5050

## 2016-02-16 HISTORY — PX: APPLICATION OF WOUND VAC: SHX5189

## 2016-02-16 HISTORY — PX: LAPAROTOMY: SHX154

## 2016-02-16 LAB — TYPE AND SCREEN
ABO/RH(D): O POS
ANTIBODY SCREEN: NEGATIVE
UNIT DIVISION: 0
UNIT DIVISION: 0
UNIT DIVISION: 0
UNIT DIVISION: 0
Unit division: 0
Unit division: 0
Unit division: 0
Unit division: 0
Unit division: 0
Unit division: 0
Unit division: 0
Unit division: 0

## 2016-02-16 LAB — CBC
HCT: 23.4 % — ABNORMAL LOW (ref 36.0–46.0)
HEMATOCRIT: 23.4 % — AB (ref 36.0–46.0)
HEMOGLOBIN: 8.1 g/dL — AB (ref 12.0–15.0)
Hemoglobin: 7.6 g/dL — ABNORMAL LOW (ref 12.0–15.0)
MCH: 30.1 pg (ref 26.0–34.0)
MCH: 28.7 pg (ref 26.0–34.0)
MCHC: 34.6 g/dL (ref 30.0–36.0)
MCHC: 32.5 g/dL (ref 30.0–36.0)
MCV: 87 fL (ref 78.0–100.0)
MCV: 88.3 fL (ref 78.0–100.0)
PLATELETS: 46 10*3/uL — AB (ref 150–400)
Platelets: 42 10*3/uL — ABNORMAL LOW (ref 150–400)
RBC: 2.69 MIL/uL — AB (ref 3.87–5.11)
RBC: 2.65 MIL/uL — ABNORMAL LOW (ref 3.87–5.11)
RDW: 15.9 % — ABNORMAL HIGH (ref 11.5–15.5)
RDW: 16.1 % — AB (ref 11.5–15.5)
WBC: 44.4 10*3/uL — AB (ref 4.0–10.5)
WBC: 45.7 10*3/uL — ABNORMAL HIGH (ref 4.0–10.5)

## 2016-02-16 LAB — BLOOD GAS, ARTERIAL
ACID-BASE EXCESS: 5.7 mmol/L — AB (ref 0.0–2.0)
BICARBONATE: 29.3 meq/L — AB (ref 20.0–24.0)
Drawn by: 290171
FIO2: 0.5
LHR: 20 {breaths}/min
O2 SAT: 96.2 %
PATIENT TEMPERATURE: 98.6
PCO2 ART: 39.5 mmHg (ref 35.0–45.0)
PEEP/CPAP: 8 cmH2O
PH ART: 7.483 — AB (ref 7.350–7.450)
TCO2: 30.5 mmol/L (ref 0–100)
VT: 490 mL
pO2, Arterial: 78 mmHg — ABNORMAL LOW (ref 80.0–100.0)

## 2016-02-16 LAB — HEPATIC FUNCTION PANEL
ALK PHOS: 82 U/L (ref 38–126)
ALT: 287 U/L — ABNORMAL HIGH (ref 14–54)
AST: 219 U/L — AB (ref 15–41)
Albumin: 1.9 g/dL — ABNORMAL LOW (ref 3.5–5.0)
BILIRUBIN DIRECT: 5.9 mg/dL — AB (ref 0.1–0.5)
BILIRUBIN TOTAL: 10.6 mg/dL — AB (ref 0.3–1.2)
Indirect Bilirubin: 4.7 mg/dL — ABNORMAL HIGH (ref 0.3–0.9)
Total Protein: 4.5 g/dL — ABNORMAL LOW (ref 6.5–8.1)

## 2016-02-16 LAB — CALCIUM, IONIZED: Calcium, Ionized, Serum: 3.3 mg/dL — ABNORMAL LOW (ref 4.5–5.6)

## 2016-02-16 LAB — GLUCOSE, CAPILLARY
GLUCOSE-CAPILLARY: 133 mg/dL — AB (ref 65–99)
GLUCOSE-CAPILLARY: 143 mg/dL — AB (ref 65–99)
GLUCOSE-CAPILLARY: 145 mg/dL — AB (ref 65–99)
GLUCOSE-CAPILLARY: 153 mg/dL — AB (ref 65–99)
GLUCOSE-CAPILLARY: 164 mg/dL — AB (ref 65–99)
GLUCOSE-CAPILLARY: 176 mg/dL — AB (ref 65–99)
Glucose-Capillary: 130 mg/dL — ABNORMAL HIGH (ref 65–99)

## 2016-02-16 LAB — BASIC METABOLIC PANEL
Anion gap: 13 (ref 5–15)
BUN: 51 mg/dL — ABNORMAL HIGH (ref 6–20)
CALCIUM: 6.5 mg/dL — AB (ref 8.9–10.3)
CHLORIDE: 98 mmol/L — AB (ref 101–111)
CO2: 28 mmol/L (ref 22–32)
CREATININE: 1.58 mg/dL — AB (ref 0.44–1.00)
GFR calc Af Amer: 49 mL/min — ABNORMAL LOW (ref >60)
GFR calc non Af Amer: 42 mL/min — ABNORMAL LOW (ref >60)
GLUCOSE: 163 mg/dL — AB (ref 65–99)
Potassium: 3.5 mmol/L (ref 3.5–5.1)
Sodium: 139 mmol/L (ref 135–145)

## 2016-02-16 LAB — PREPARE RBC (CROSSMATCH)

## 2016-02-16 SURGERY — LAPAROTOMY, EXPLORATORY
Anesthesia: General | Site: Hip | Laterality: Right

## 2016-02-16 MED ORDER — EPHEDRINE SULFATE 50 MG/ML IJ SOLN
INTRAMUSCULAR | Status: AC
  Filled 2016-02-16: qty 1

## 2016-02-16 MED ORDER — LACTATED RINGERS IV SOLN
INTRAVENOUS | Status: DC | PRN
  Administered 2016-02-16: 10:00:00 via INTRAVENOUS

## 2016-02-16 MED ORDER — SODIUM CHLORIDE 0.9 % IJ SOLN
INTRAMUSCULAR | Status: AC
  Filled 2016-02-16: qty 10

## 2016-02-16 MED ORDER — PHENYLEPHRINE HCL 10 MG/ML IJ SOLN
INTRAMUSCULAR | Status: DC | PRN
  Administered 2016-02-16: 80 ug via INTRAVENOUS

## 2016-02-16 MED ORDER — DEXAMETHASONE SODIUM PHOSPHATE 10 MG/ML IJ SOLN
INTRAMUSCULAR | Status: AC
  Filled 2016-02-16: qty 1

## 2016-02-16 MED ORDER — LIDOCAINE HCL (CARDIAC) 20 MG/ML IV SOLN
INTRAVENOUS | Status: AC
  Filled 2016-02-16: qty 10

## 2016-02-16 MED ORDER — PROPOFOL 10 MG/ML IV BOLUS: INTRAVENOUS | Status: DC | PRN

## 2016-02-16 MED ORDER — DEXAMETHASONE SODIUM PHOSPHATE 4 MG/ML IJ SOLN
INTRAMUSCULAR | Status: AC
  Filled 2016-02-16: qty 1

## 2016-02-16 MED ORDER — FENTANYL CITRATE (PF) 250 MCG/5ML IJ SOLN
INTRAMUSCULAR | Status: DC | PRN
  Administered 2016-02-16 (×5): 50 ug via INTRAVENOUS

## 2016-02-16 MED ORDER — ROCURONIUM BROMIDE 50 MG/5ML IV SOLN
INTRAVENOUS | Status: AC
  Filled 2016-02-16: qty 5

## 2016-02-16 MED ORDER — ONDANSETRON HCL 4 MG/2ML IJ SOLN
INTRAMUSCULAR | Status: AC
  Filled 2016-02-16: qty 2

## 2016-02-16 MED ORDER — PROPOFOL 10 MG/ML IV BOLUS
INTRAVENOUS | Status: AC
  Filled 2016-02-16: qty 40

## 2016-02-16 MED ORDER — PHENYLEPHRINE 40 MCG/ML (10ML) SYRINGE FOR IV PUSH (FOR BLOOD PRESSURE SUPPORT)
PREFILLED_SYRINGE | INTRAVENOUS | Status: AC
  Filled 2016-02-16: qty 30

## 2016-02-16 MED ORDER — SODIUM CHLORIDE 0.9 % IV SOLN: 10.0000 mL/h | Freq: Once | INTRAVENOUS | Status: DC

## 2016-02-16 MED ORDER — MIDAZOLAM HCL 2 MG/2ML IJ SOLN
INTRAMUSCULAR | Status: AC
  Filled 2016-02-16: qty 4

## 2016-02-16 MED ORDER — SUCCINYLCHOLINE CHLORIDE 20 MG/ML IJ SOLN
INTRAMUSCULAR | Status: AC
  Filled 2016-02-16: qty 2

## 2016-02-16 MED ORDER — GLYCOPYRROLATE 0.2 MG/ML IJ SOLN
INTRAMUSCULAR | Status: AC
  Filled 2016-02-16: qty 3

## 2016-02-16 MED ORDER — ROCURONIUM BROMIDE 100 MG/10ML IV SOLN
INTRAVENOUS | Status: DC | PRN
  Administered 2016-02-16 (×4): 50 mg via INTRAVENOUS

## 2016-02-16 MED ORDER — 0.9 % SODIUM CHLORIDE (POUR BTL) OPTIME
TOPICAL | Status: DC | PRN
  Administered 2016-02-16 (×4): 1000 mL

## 2016-02-16 MED ORDER — ROCURONIUM BROMIDE 100 MG/10ML IV SOLN: INTRAVENOUS | Status: DC | PRN

## 2016-02-16 MED ORDER — FENTANYL CITRATE (PF) 250 MCG/5ML IJ SOLN
INTRAMUSCULAR | Status: AC
  Filled 2016-02-16: qty 5

## 2016-02-16 MED ORDER — MIDAZOLAM HCL 2 MG/2ML IJ SOLN
INTRAMUSCULAR | Status: AC
  Filled 2016-02-16: qty 2

## 2016-02-16 MED ORDER — SODIUM CHLORIDE 0.9 % IV SOLN
2.0000 g | Freq: Once | INTRAVENOUS | Status: AC
  Administered 2016-02-16: 2 g via INTRAVENOUS
  Filled 2016-02-16: qty 20

## 2016-02-16 MED ORDER — NEOSTIGMINE METHYLSULFATE 10 MG/10ML IV SOLN
INTRAVENOUS | Status: AC
  Filled 2016-02-16: qty 2

## 2016-02-16 MED ORDER — MIDAZOLAM HCL 5 MG/5ML IJ SOLN
INTRAMUSCULAR | Status: DC | PRN
  Administered 2016-02-16 (×3): 2 mg via INTRAVENOUS

## 2016-02-16 MED ORDER — VECURONIUM BROMIDE 10 MG IV SOLR
INTRAVENOUS | Status: AC
  Filled 2016-02-16: qty 10

## 2016-02-16 SURGICAL SUPPLY — 66 items
2.8 GUIDE WIRE, SYNTHES ×8 IMPLANT
BENZOIN TINCTURE PRP APPL 2/3 (GAUZE/BANDAGES/DRESSINGS) ×8 IMPLANT
BIT DRILL CANN LRG QC 5X300 (BIT) ×4 IMPLANT
BLADE SURG 10 STRL SS (BLADE) ×4 IMPLANT
BLADE SURG 15 STRL LF DISP TIS (BLADE) ×2 IMPLANT
BLADE SURG 15 STRL SS (BLADE) ×2
BNDG GAUZE ELAST 4 BULKY (GAUZE/BANDAGES/DRESSINGS) ×4 IMPLANT
CANISTER SUCTION 2500CC (MISCELLANEOUS) ×4 IMPLANT
CANISTER WOUND CARE 500ML ATS (WOUND CARE) ×4 IMPLANT
COVER SURGICAL LIGHT HANDLE (MISCELLANEOUS) ×8 IMPLANT
DRAPE C-ARM 42X72 X-RAY (DRAPES) ×4 IMPLANT
DRAPE C-ARMOR (DRAPES) ×4 IMPLANT
DRAPE LAPAROSCOPIC ABDOMINAL (DRAPES) ×4 IMPLANT
DRSG MEPILEX BORDER 4X4 (GAUZE/BANDAGES/DRESSINGS) ×4 IMPLANT
DRSG OPSITE POSTOP 4X10 (GAUZE/BANDAGES/DRESSINGS) IMPLANT
DRSG OPSITE POSTOP 4X8 (GAUZE/BANDAGES/DRESSINGS) IMPLANT
DRSG PAD ABDOMINAL 8X10 ST (GAUZE/BANDAGES/DRESSINGS) IMPLANT
ELECT CAUTERY BLADE 6.4 (BLADE) ×4 IMPLANT
ELECT REM PT RETURN 9FT ADLT (ELECTROSURGICAL) ×4
ELECTRODE REM PT RTRN 9FT ADLT (ELECTROSURGICAL) ×2 IMPLANT
GAUZE SPONGE 4X4 12PLY STRL (GAUZE/BANDAGES/DRESSINGS) IMPLANT
GAUZE XEROFORM 5X9 LF (GAUZE/BANDAGES/DRESSINGS) IMPLANT
GLOVE BIO SURGEON STRL SZ7.5 (GLOVE) IMPLANT
GLOVE BIO SURGEON STRL SZ8 (GLOVE) ×8 IMPLANT
GLOVE BIOGEL PI IND STRL 7.0 (GLOVE) ×6 IMPLANT
GLOVE BIOGEL PI IND STRL 7.5 (GLOVE) ×2 IMPLANT
GLOVE BIOGEL PI IND STRL 8 (GLOVE) ×6 IMPLANT
GLOVE BIOGEL PI INDICATOR 7.0 (GLOVE) ×6
GLOVE BIOGEL PI INDICATOR 7.5 (GLOVE) ×2
GLOVE BIOGEL PI INDICATOR 8 (GLOVE) ×6
GLOVE ECLIPSE 7.5 STRL STRAW (GLOVE) ×4 IMPLANT
GLOVE SURG SS PI 7.0 STRL IVOR (GLOVE) ×12 IMPLANT
GOWN STRL REUS W/ TWL LRG LVL3 (GOWN DISPOSABLE) ×16 IMPLANT
GOWN STRL REUS W/ TWL XL LVL3 (GOWN DISPOSABLE) ×4 IMPLANT
GOWN STRL REUS W/TWL LRG LVL3 (GOWN DISPOSABLE) ×16
GOWN STRL REUS W/TWL XL LVL3 (GOWN DISPOSABLE) ×4
KIT BASIN OR (CUSTOM PROCEDURE TRAY) ×4 IMPLANT
KIT ROOM TURNOVER OR (KITS) ×4 IMPLANT
NS IRRIG 1000ML POUR BTL (IV SOLUTION) ×16 IMPLANT
PACK GENERAL/GYN (CUSTOM PROCEDURE TRAY) ×4 IMPLANT
PAD ABD 8X10 STRL (GAUZE/BANDAGES/DRESSINGS) ×12 IMPLANT
PAD ARMBOARD 7.5X6 YLW CONV (MISCELLANEOUS) ×12 IMPLANT
SCREW CANN 7.3X135 32MM THRD (Screw) ×4 IMPLANT
SCREW CANN 7.3X145 32MM THRD (Screw) ×4 IMPLANT
SCREW CANN 7.3X70 32MM THRD (Screw) ×4 IMPLANT
SPECIMEN JAR LARGE (MISCELLANEOUS) IMPLANT
SPONGE ABDOMINAL VAC ABTHERA (MISCELLANEOUS) ×4 IMPLANT
SPONGE LAP 18X18 X RAY DECT (DISPOSABLE) ×4 IMPLANT
STAPLER VISISTAT 35W (STAPLE) ×4 IMPLANT
SUCTION POOLE TIP (SUCTIONS) ×4 IMPLANT
SUT ETHILON 3 0 PS 1 (SUTURE) IMPLANT
SUT PDS AB 1 TP1 96 (SUTURE) ×8 IMPLANT
SUT SILK 2 0 SH CR/8 (SUTURE) ×4 IMPLANT
SUT SILK 2 0 TIES 10X30 (SUTURE) ×4 IMPLANT
SUT SILK 3 0 SH CR/8 (SUTURE) ×4 IMPLANT
SUT SILK 3 0 TIES 10X30 (SUTURE) ×4 IMPLANT
SUT VIC AB 2-0 FS1 27 (SUTURE) IMPLANT
SYR BULB IRRIGATION 50ML (SYRINGE) ×4 IMPLANT
THREAD TIPPED GUIDE PIN 2.8MM IMPLANT
TOWEL OR 17X24 6PK STRL BLUE (TOWEL DISPOSABLE) ×4 IMPLANT
TOWEL OR 17X26 10 PK STRL BLUE (TOWEL DISPOSABLE) ×8 IMPLANT
TRAY FOLEY CATH 16FRSI W/METER (SET/KITS/TRAYS/PACK) IMPLANT
UNDERPAD 30X30 INCONTINENT (UNDERPADS AND DIAPERS) IMPLANT
WASHER OIC 13MM 6 PACK (Screw) ×8 IMPLANT
WATER STERILE IRR 1000ML POUR (IV SOLUTION) IMPLANT
YANKAUER SUCT BULB TIP NO VENT (SUCTIONS) IMPLANT

## 2016-02-16 NOTE — Progress Notes (Signed)
Patient ID: [name] Bell, female   DOB: Sep 09, 1981, 35 y.o.   MRN: [id number] Plan exploratory laparotomy again today. I spoke with her sister and explained the procedure, risks, and benefits. I answered her questions. She agrees. [name] Kaio Kuhlman, MD, MPH, FACS Trauma: [phone] General Surgery: [phone]

## 2016-02-16 NOTE — Transfer of Care (Signed)
Immediate Anesthesia Transfer of Care Note  Patient: [name] Bell  Procedure(s) Performed: Procedure(s): EXPLORATORY LAPAROTOMY, ABDOMINAL [place] (N/A) SACRO-ILIAC PINNING (Right) RE-APPLICATION OF WOUND VAC (N/A)  Patient Location: ICU  Anesthesia Type:General  Level of Consciousness: sedated and Patient remains intubated per anesthesia plan  Airway & Oxygen Therapy: Patient remains intubated per anesthesia plan and Patient placed on Ventilator (see vital sign flow sheet for setting)  Post-op Assessment: Report given to RN and Post -op Vital signs reviewed and stable  Post vital signs: Reviewed and stable  Last Vitals:  Filed Vitals:   02/16/16 0930 02/16/16 1000  BP: 123/55 120/61  Pulse: 98 110  Temp:    Resp: 0 6    Complications: No apparent anesthesia complications    Pt tx from OR to ICU with standard monitors (HR, BP, SPO2). Emergency drugs and equipment available. VSS. Met by RN and RT. Report given and all questions answered. VSS during transport and during handoff.

## 2016-02-16 NOTE — Brief Op Note (Addendum)
02/09/2016 - 02/16/2016  5:14 PM  PATIENT:  Phylliss BobKatia D Braaten  35 y.o. female  PRE-OPERATIVE DIAGNOSIS:  MULTIPLE PELVIC FRACTURES WITH INSTABILITY  POST-OPERATIVE DIAGNOSIS:  MULTIPLE PELVIC FRACTURES WITH INSTABILITY  PROCEDURE:  Procedure(s): SEE SEPARATE PROCEDURES BY GENERAL SURGERY TRAUMA SERVICE  TRANSSACRAL SACRO-ILIAC PINNING RIGHT AND LEFT, S1 AND S2  SURGEON:  Surgeon(s) and Role: Panel 1:    * [name] Thompson, MD - Primary    * Jimmye NormanJames [name], MD - Assisting  Panel 2:    * Myrene GalasMichael Joely Losier, MD - Primary  PHYSICIAN ASSISTANT: None  ANESTHESIA:   general  I/O:  Total I/O In: 1227.5 [I.V.:1200; NG/GT:27.5] Out: 2270 [Urine:2070; Drains:150; Blood:50]  SPECIMEN:  No Specimen  TOURNIQUET:  * No tourniquets in log *  DICTATION: .[id number]

## 2016-02-16 NOTE — Anesthesia Preprocedure Evaluation (Signed)
Anesthesia Evaluation  Patient identified by MRN, date of birth, ID band Patient awake and Patient unresponsive  General Assessment Comment:Pt intubated.  Reviewed: Allergy & Precautions, NPO status , Patient's Chart, lab work & pertinent test results, Unable to perform ROS - Chart review only  Airway Mallampati: Intubated  TM Distance: >3 FB Neck ROM: Limited    Dental  (+) Teeth Intact, Dental Advisory Given   Pulmonary  Acute respiratory failure s/p intubation     unstable + intubated    Cardiovascular negative cardio ROS   Rhythm:Regular Rate:Tachycardia  Hypotensive requiring pressor support   Neuro/Psych Suicide attempt 4/3/17negative neurological ROS     GI/Hepatic negative GI ROS, Neg liver ROS, Allegedly ingested liquid drain-o, 2 bottles liquid tylenol EGD showed stomach ulcer    Endo/Other  Morbid obesityobese  Renal/GU negative Renal ROS     Musculoskeletal S/p pelvic ex-fix after jumping from 5 story building   Abdominal (+) + obese,   Peds  Hematology  (+) Blood dyscrasia, anemia ,   Anesthesia Other Findings   Reproductive/Obstetrics negative OB ROS                             Lab Results  Component Value Date   WBC 44.4* 02/16/2016   HGB 8.1* 02/16/2016   HCT 23.4* 02/16/2016   MCV 87.0 02/16/2016   PLT 42* 02/16/2016   Lab Results  Component Value Date   INR 1.75* 02/13/2016   INR 2.04* 02/13/2016   INR 2.16* 02/12/2016   Lab Results  Component Value Date   CREATININE 1.58* 02/16/2016   BUN 51* 02/16/2016   NA 139 02/16/2016   K 3.5 02/16/2016   CL 98* 02/16/2016   CO2 28 02/16/2016    Anesthesia Physical  Anesthesia Plan  ASA: IV  Anesthesia Plan: General   Post-op Pain Management:    Induction: Inhalational  Airway Management Planned: Oral ETT  Additional Equipment: Arterial line  Intra-op Plan:   Post-operative Plan: Post-operative  intubation/ventilation  Informed Consent: I have reviewed the patients History and Physical, chart, labs and discussed the procedure including the risks, benefits and alternatives for the proposed anesthesia with the patient or authorized representative who has indicated his/her understanding and acceptance.     Plan Discussed with: CRNA, Anesthesiologist and Surgeon  Anesthesia Plan Comments:         Anesthesia Quick Evaluation

## 2016-02-16 NOTE — Progress Notes (Signed)
Nutrition Follow-up  DOCUMENTATION CODES:   Obesity unspecified  INTERVENTION:   Continue: Pivot 1.5 @ 30 ml/hr 60 ml Prostat TID Provides: 1680 kcal, 157 grams protein, and 546 ml H2O.   NUTRITION DIAGNOSIS:   Increased nutrient needs related to wound healing, catabolic illness as evidenced by estimated needs. Ongoing.   GOAL:   Provide needs based on ASPEN/SCCM guidelines Met.   MONITOR:   Skin, I & O's, Vent status, Labs  ASSESSMENT:   Pt from DC with no past medical hx admitted after suicide attempt by ingestion of Drano, 2 bottles of acetaminophen, nyquil, and possibly 6 alprazolam pills and jumping from 5th floor balcony of hotel. 4/3 pt is s/p exp lap, hepatorraphy, SBR, cholecystectomy, preperitoneal pelvic packing, ex fix pelvis, R HPTX, Bil pulmonary contusions. Plan for repeat OR 4/5 for another possible SBR. Abdomen remains open.   Patient is currently intubated on ventilator support MV: 9.4 L/min Temp (24hrs), Avg:99.2 F (37.3 C), Min:98.7 F (37.1 C), Max:99.9 F (37.7 C)  4/7 s/p exp lap, abd VAC dressing change 4/7 Pivot 1.5 started @ 10 ml/hr 4/8 Pivot 1.5 increased to 20 ml/hr, weaned from all pressors 4/9 Pivot 1.5 reached goal of 30 ml/hr, prostat added 60 ml TID 4/10 repeat OR with washout, abdomen left open with negative pressure wound dressing (VAC)  VAC output 1000 ml 4/9 Chest tubes 80 ml out total 4/9 Labs reviewed CBG 143 Discussed above with RN, pt currently in OR.   Diet Order:  Diet NPO time specified  Skin:  Wound (see comment) (lacerations,abrasions,surgical incisions,VAC)  Last BM:  unknown  Height:   Ht Readings from Last 1 Encounters:  02/09/16 5' 11"  (1.803 m)    Weight:   Wt Readings from Last 1 Encounters:  02/16/16 335 lb 1.6 oz (152 kg)    Ideal Body Weight:  70.4 kg  BMI:  Body mass index is 46.76 kg/(m^2).  Estimated Nutritional Needs:   Kcal:  2751-7001  Protein:  >/= 140 grams  Fluid:  > 2  L/day  EDUCATION NEEDS:   No education needs identified at this time  De[place], [place], [facility] Pager [phone] After Hours Pager

## 2016-02-16 NOTE — Op Note (Addendum)
02/09/2016 - 02/16/2016  11:22 AM  PATIENT:  [name] Bell  35 y.o. female  PRE-OPERATIVE DIAGNOSIS:  Open Abdomen  POST-OPERATIVE DIAGNOSIS:  same  PROCEDURE:  Procedure(s): EXPLORATORY LAPAROTOMY PLACEMENT NEGATIVE PRESSURE OPEN ABDOMEN DEVICE  SURGEON:  [name] Lyvia Mondesir, M.D.  ASSISTANTS: Frederik SchmidtJay [name], M.D.   ANESTHESIA:   general  EBL:  Total I/O In: 230 [I.V.:230] Out: 1365 [Urine:1190; Drains:150; Blood:25]  BLOOD ADMINISTERED:none  DRAINS: none   SPECIMEN:  No Specimen  DISPOSITION OF SPECIMEN:  N/A  COUNTS:  YES  DICTATION: .Dragon Dictation Findings: Bowel is viable but mildly distended, rectosigmoid contusion stable, no significant bleeding from liver or elsewhere  Procedure in detail: Ms. [name] is brought back to the operating room for reexploration of open abdomen. She has improved significantly and is no longer on pressors. She has not required blood product transfusion for the past 48 hours. Informed consent was obtained. She is on IV antibiotics currently. She was brought directly to the operating room from the intensive care unit on the ventilator. Time out procedure was performed. Her outer VAC drapes were removed. Abdomen was prepped and draped in sterile fashion. Inner VAC drape was then removed. There were some small blood clots but no significant fluid collection. The abdomen was then explored and irrigated. There is no significant bleeding in any quadrants. Liver appeared stable in appearance compared with her last exploration. Rectosigmoid contusion is stable. Small bowel was run from the ligament of Treitz down to the terminal ileum and was viable. The anastomosis remains intact. The bowel was mildly distended. Bowel contents were returned to anatomic positioning. Bowel extended significantly beyond the fascia again so decision was made to replace open abdomen VAC device. The inner drape was irrigated and tucked around all of the bowel carefully. 2 blue sponges  were fashioned on top and VAC drapes were applied.. Pump was hooked up and there was good seal. All counts were correct. She tolerated this portion of the procedure without apparent complication and remained in the operating room with Dr. Carola FrostHandy.  PATIENT DISPOSITION:  Remained in the operating room with Dr. Carola FrostHandy   Delay start of Pharmacological VTE agent (>24hrs) due to surgical blood loss or risk of bleeding:  no  [name] Brittnee Gaetano, MD, MPH, FACS Pager: [phone] is required by yes in the right upper extremity on a couple skin is very tissues were born 8the small probably was not  1  4/10/201711:22 AM

## 2016-02-16 NOTE — Clinical Social Work Note (Signed)
Clinical Social Worker continuing to follow patient and family for support.  Patient is currently in the OR and patient sister is meeting with financial counseling to explore Medicaid and Disability on patient behalf.  CSW remains available for support and to assist with patient needs as needed.  Macario GoldsJesse Sherley Leser, [place] [phone]

## 2016-02-16 NOTE — Clinical Social Work Note (Signed)
Clinical Social Work Assessment  Patient Details  Name: [name] Bell MRN: [id number] Date of Birth: May 15, 1981  Date of referral:  02/12/16               Reason for consult:  Trauma                Permission sought to share information with:  Family Supports Permission granted to share information::  Yes, Verbal Permission Granted  Name::     [place]  Relationship::  Sister  Contact Information:  [phone]  Housing/Transportation Living arrangements for the past 2 months:  Single Family Home Source of Information:  Other (Comment Required) (Sister) Patient Interpreter Needed:  None Criminal Activity/Legal Involvement Pertinent to Current Situation/Hospitalization:  Yes (Attempted Suicide) Significant Relationships:  Siblings, Other Family Members, Friend Lives with:  Siblings Do you feel safe going back to the place where you live?  Yes Need for family participation in patient care:  Yes (Comment)  Care giving concerns:  Patient sister verbalizes that she does not feel that she will be able to make a decision regarding patient code status and the potential for any type of withdraw plans if needed.  Patient sister is hopeful for patient full recovery and plans to seek appropriate treatment.  Patient sister has also requested to pursue Medicaid/Disability on patient behalf - financial counselor notified.   Social Worker assessment / plan:  Holiday representative met with patient sister at bedside to offer support and guidance.  Patient sister states that patient moved here from [place] to stay with her about a month ago.  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  Patient sister states that he birthday was Friday, [birth year] and they hung out and had a great day.  Patient then went "missing" Saturday according to her sister.  Later, it was found that patient had been staying at the [facility].   Patient sent a "suicide note" to her friend in [place], who notified local law enforcement.  Patient then transferred a significant amount of money into her sisters bank account, which was stated later, that the money be used for her cremation.  Patient sister is appropriately distraught regarding patient actions - patient drank Drano, swallowed quite a bit of Tylenol, and then jumped from the 5th story at [employer], barely missing the sidewalk.  Patient sister was notified of patient actions by police.  Patient sister is hopeful for patient to make a full recovery and plans to assist in all avenues for patient to receive appropriate treatment.  Patient currently remains on the ventilator, however once patient is able to progress from the ventilator, patient to be assessed by Psychiatry.  CSW remains available for support to patient and family as needed throughout hospitalization.  Employment status:  Systems developer information:  Self Pay (Medicaid Pending) PT Recommendations:  Not assessed at this time Information / Referral to community resources:  Other (Comment Required) (Pending patient progress)  Patient/Family's Response to care:   Patient sister verbalizes understanding and appreciation for CSW support and concern.  Patient sister has full intentions to seek appropriate support for patient if patient is able to progress medically.  Patient sister requests assistance with Medicaid and Disability as well as support from Psychiatry to explore potential options for down the road.  Patient/Family's Understanding of and Emotional Response to Diagnosis, Current Treatment, and Prognosis:  Patient sister seems  to have a realistic idea about patient current medical and mental status, however not willing to accept at this time.  Patient sister verbalizes understanding that patient remains critical and may not survive hospitalization, however she remains strong in her faith that patient will have a full  recovery.  Emotional Assessment Appearance:  Appears stated age Attitude/Demeanor/Rapport:  Unable to Assess Affect (typically observed):  Unable to Assess Orientation:  Oriented to Self Alcohol / Substance use:  Not Applicable Psych involvement (Current and /or in the community):  Yes (Comment)  Discharge Needs  Concerns to be addressed:  Mental Health Concerns, Care Coordination, Coping/Stress Concerns, Decision making concerns, Adjustment to Illness Readmission within the last 30 days:  No Current discharge risk:  Psychiatric Illness Barriers to Discharge:  Continued Medical Work up, Requiring sitter/restraints  Barbette Or, [place]

## 2016-02-16 NOTE — Progress Notes (Signed)
Patient ID: [name] Bell, female   DOB: 1981/01/31, 35 y.o.   MRN: [id number] Follow up - Trauma Critical Care  Patient Details:    [name] Bell is an 35 y.o. female.  Lines/tubes : Airway 7.5 mm (Active)  Secured at (cm) 26 cm 02/16/2016  4:00 AM  Measured From Lips 02/16/2016  4:00 AM  Secured Location Center 02/16/2016  4:00 AM  Secured By [employer] 02/16/2016  4:00 AM  Tube Holder Repositioned Yes 02/16/2016  4:00 AM  Cuff Pressure (cm H2O) 26 cm H2O 02/15/2016  3:59 PM  Site Condition Dry 02/16/2016  4:00 AM     PICC Triple Lumen 02/15/16 PICC Right Cephalic 55 cm 0 cm (Active)  Indication for Insertion or Continuance of Line Vasoactive infusions;Head or chest injuries (Tracheotomy, burns, open chest wounds);Prolonged intravenous therapies;Limited venous access - need for IV therapy >5 days (PICC only);Poor Vasculature-patient has had multiple peripheral attempts or PIVs lasting less than 24 hours 02/15/2016  8:00 PM  Exposed Catheter (cm) 0 cm 02/15/2016  1:23 PM  Site Assessment Clean;Dry;Intact 02/15/2016  8:00 PM  Lumen #1 Status Infusing 02/15/2016  8:00 PM  Lumen #2 Status Infusing 02/15/2016  8:00 PM  Lumen #3 Status Other (Comment) 02/15/2016  8:00 PM  Dressing Type Transparent 02/15/2016  8:00 PM  Dressing Status Clean;Dry;Intact;Antimicrobial disc in place 02/15/2016  8:00 PM  Line Care Connections checked and tightened 02/15/2016  8:00 PM  Line Adjustment (NICU/IV Team Only) No 02/15/2016  1:23 PM  Dressing Intervention Dressing reinforced 02/15/2016  8:00 PM  Dressing Change Due 02/22/16 02/15/2016  8:00 PM     Chest Tube 1 Right Pleural 32 Fr. (Active)  Suction -20 cm H2O 02/15/2016  8:00 PM  Chest Tube Air Leak None 02/15/2016  8:00 PM  Patency Intervention Tip/tilt;Milked 02/15/2016  8:00 AM  Drainage Description Bright red 02/15/2016  8:00 PM  Dressing Status Clean;Dry;Intact 02/15/2016  8:00 PM  Dressing Intervention New dressing 02/15/2016 12:45 PM  Site Assessment Bleeding 02/15/2016  12:45 PM  Surrounding Skin Unable to view 02/15/2016  8:00 PM  Output (mL) 0 mL 02/16/2016  6:00 AM     Chest Tube 1 Right Pleural 32 Fr. (Active)  Suction -20 cm H2O 02/15/2016  8:00 PM  Chest Tube Air Leak None 02/15/2016  8:00 PM  Patency Intervention Tip/tilt;Milked 02/15/2016  8:00 AM  Drainage Description Bright red;Clots 02/15/2016  8:00 PM  Dressing Status Old drainage;New drainage;Intact 02/15/2016  8:00 PM  Dressing Intervention Dressing reinforced 02/15/2016  8:00 AM  Site Assessment Bleeding 02/14/2016  8:00 AM  Surrounding Skin Unable to view 02/15/2016  8:00 PM  Output (mL) 0 mL 02/16/2016  6:00 AM     Negative Pressure Wound Therapy Abdomen (Active)  Last dressing change 02/13/16 02/15/2016  8:00 PM  Site / Wound Assessment Dressing in place / Unable to assess 02/15/2016  8:00 PM  Peri-wound Assessment Intact 02/15/2016  8:00 PM  Cycle Continuous 02/15/2016  8:00 PM  Target Pressure (mmHg) 125 02/15/2016  8:00 PM  Canister Changed Yes 02/14/2016  7:00 PM  Dressing Status Intact 02/15/2016  8:00 PM  Drainage Amount Moderate 02/15/2016  8:00 PM  Drainage Description Serosanguineous 02/15/2016  8:00 PM  Output (mL) 500 mL 02/16/2016  6:00 AM     NG/OG Tube Orogastric 16 Fr. Center mouth (Active)  Placement Verification Auscultation 02/15/2016  8:00 PM  Site Assessment Clean;Dry;Intact 02/15/2016  8:00 PM  Status Infusing tube feed 02/15/2016  8:00 PM  Amount of suction 116 mmHg 02/12/2016  8:00 PM  Drainage Appearance Serosanguineous 02/13/2016  8:00 AM     Urethral Catheter S. Bowman,RN Latex 16 Fr. (Active)  Indication for Insertion or Continuance of Catheter Unstable critical patients (first 24-48 hours) 02/15/2016  8:00 PM  Site Assessment Intact 02/15/2016  8:00 PM  Catheter Maintenance Bag below level of bladder;Catheter secured;Drainage bag/tubing not touching floor;Insertion date on drainage bag;No dependent loops;Seal intact 02/15/2016  8:00 PM  Collection Container Standard drainage bag 02/15/2016  8:00 PM   Securement Method Securing device (Describe) 02/15/2016  8:00 PM  Urinary Catheter Interventions Unclamped 02/15/2016  8:00 PM  Output (mL) 350 mL 02/16/2016  5:00 AM    Microbiology/Sepsis markers: Results for orders placed or performed during the hospital encounter of 02/09/16  MRSA PCR Screening     Status: None   Collection Time: 02/11/16 11:30 AM  Result Value Ref Range Status   MRSA by PCR NEGATIVE NEGATIVE Final    Comment:        The GeneXpert MRSA Assay (FDA approved for NASAL specimens only), is one component of a comprehensive MRSA colonization surveillance program. It is not intended to diagnose MRSA infection nor to guide or monitor treatment for MRSA infections.     Anti-infectives:  Anti-infectives    Start     Dose/Rate Route Frequency Ordered Stop   02/11/16 0600  piperacillin-tazobactam (ZOSYN) IVPB 3.375 g     3.375 g 100 mL/hr over 30 Minutes Intravenous 4 times per day 02/10/16 2214     02/10/16 1730  piperacillin-tazobactam (ZOSYN) IVPB 3.375 g  Status:  Discontinued     3.375 g 12.5 mL/hr over 240 Minutes Intravenous 3 times per day 02/10/16 1727 02/10/16 2214      Best Practice/Protocols:  VTE Prophylaxis: Mechanical Intermittent Sedation  Consults: Treatment Team:  Myrene Galas, MD    Studies:CXR - 1. Lines and tubes including 2 right chest tubes in stable position. Slightly improving right apical loculated pleural effusion. Persistent bilateral pleural effusions are present. No pneumothorax .  2. Persistent diffuse right lung infiltrate, slightly improved from prior exam. Persistent left lower lobe infiltrate, no change from prior exam.  3. Multiple displaced right rib fractures again noted.  Subjective:    Overnight Issues: stale  Objective:  Vital signs for last 24 hours: Temp:  [98.6 F (37 C)-99.2 F (37.3 C)] 99.2 F (37.3 C) (04/10 0400) Pulse Rate:  [89-109] 95 (04/10 0630) Resp:  [0-45] 20 (04/10 0630) BP:  (79-145)/(49-86) 115/56 mmHg (04/10 0630) SpO2:  [89 %-100 %] 98 % (04/10 0630) FiO2 (%):  [50 %] 50 % (04/10 0600) Weight:  [152 kg (335 lb 1.6 oz)] 152 kg (335 lb 1.6 oz) (04/10 0500)  Hemodynamic parameters for last 24 hours: CVP:  [1 mmHg-29 mmHg] 9 mmHg  Intake/Output from previous day: 04/09 0701 - 04/10 0700 In: 1992.7 [I.V.:1310; NG/GT:362.7; IV Piggyback:320] Out: 4980 [Urine:3900; Drains:1000; Chest Tube:80]  Intake/Output this shift:    Vent settings for last 24 hours: Vent Mode:  [-] PRVC FiO2 (%):  [50 %] 50 % Set Rate:  [20 bmp] 20 bmp Vt Set:  [490 mL] 490 mL PEEP:  [8 cmH20] 8 cmH20 Plateau Pressure:  [19 cmH20-27 cmH20] 20 cmH20  Physical Exam:  General: on vent Neuro: arouses and F/C HEENT/Neck: less edema Resp: clear to auscultation bilaterally CVS: RRR GI: open abd VAC, pelvic pin sites weeping Extremities: edema 4+  Results for orders placed or performed during the hospital  encounter of 02/09/16 (from the past 24 hour(s))  Hemoglobin and hematocrit, blood     Status: Abnormal   Collection Time: 02/15/16 10:40 AM  Result Value Ref Range   Hemoglobin 8.3 (L) 12.0 - 15.0 g/dL   HCT 16.1 (L) 09.6 - 04.5 %  Glucose, capillary     Status: Abnormal   Collection Time: 02/15/16  1:22 PM  Result Value Ref Range   Glucose-Capillary 146 (H) 65 - 99 mg/dL  Glucose, capillary     Status: Abnormal   Collection Time: 02/15/16  3:32 PM  Result Value Ref Range   Glucose-Capillary 157 (H) 65 - 99 mg/dL  CBC     Status: Abnormal   Collection Time: 02/15/16  4:45 PM  Result Value Ref Range   WBC 35.6 (H) 4.0 - 10.5 K/uL   RBC 2.72 (L) 3.87 - 5.11 MIL/uL   Hemoglobin 7.8 (L) 12.0 - 15.0 g/dL   HCT 40.9 (L) 81.1 - 91.4 %   MCV 85.3 78.0 - 100.0 fL   MCH 28.7 26.0 - 34.0 pg   MCHC 33.6 30.0 - 36.0 g/dL   RDW 78.2 (H) 95.6 - 21.3 %   Platelets 30 (L) 150 - 400 K/uL  Glucose, capillary     Status: Abnormal   Collection Time: 02/15/16  7:59 PM  Result Value Ref  Range   Glucose-Capillary 168 (H) 65 - 99 mg/dL  Hemoglobin and hematocrit, blood     Status: Abnormal   Collection Time: 02/15/16  9:43 PM  Result Value Ref Range   Hemoglobin 7.9 (L) 12.0 - 15.0 g/dL   HCT 08.6 (L) 57.8 - 46.9 %  Glucose, capillary     Status: Abnormal   Collection Time: 02/15/16 11:43 PM  Result Value Ref Range   Glucose-Capillary 176 (H) 65 - 99 mg/dL  Glucose, capillary     Status: Abnormal   Collection Time: 02/16/16  3:30 AM  Result Value Ref Range   Glucose-Capillary 153 (H) 65 - 99 mg/dL  Blood gas, arterial     Status: Abnormal   Collection Time: 02/16/16  4:20 AM  Result Value Ref Range   FIO2 0.50    Delivery systems VENTILATOR    Mode PRESSURE REGULATED VOLUME CONTROL    VT 490 mL   LHR 20 resp/min   Peep/cpap 8.0 cm H20   pH, Arterial 7.483 (H) 7.350 - 7.450   pCO2 arterial 39.5 35.0 - 45.0 mmHg   pO2, Arterial 78.0 (L) 80.0 - 100.0 mmHg   Bicarbonate 29.3 (H) 20.0 - 24.0 mEq/L   TCO2 30.5 0 - 100 mmol/L   Acid-Base Excess 5.7 (H) 0.0 - 2.0 mmol/L   O2 Saturation 96.2 %   Patient temperature 98.6    Collection site RIGHT RADIAL    Drawn by [phone]    Sample type ARTERIAL DRAW    Allens test (pass/fail) PASS PASS  CBC     Status: Abnormal (Preliminary result)   Collection Time: 02/16/16  5:52 AM  Result Value Ref Range   WBC PENDING 4.0 - 10.5 K/uL   RBC 2.69 (L) 3.87 - 5.11 MIL/uL   Hemoglobin 8.1 (L) 12.0 - 15.0 g/dL   HCT 41.3 (L) 24.4 - 01.0 %   MCV 87.0 78.0 - 100.0 fL   MCH 30.1 26.0 - 34.0 pg   MCHC 34.6 30.0 - 36.0 g/dL   RDW 27.2 (H) 53.6 - 64.4 %   Platelets 42 (L) 150 - 400 K/uL  Basic metabolic  panel     Status: Abnormal   Collection Time: 02/16/16  5:52 AM  Result Value Ref Range   Sodium 139 135 - 145 mmol/L   Potassium 3.5 3.5 - 5.1 mmol/L   Chloride 98 (L) 101 - 111 mmol/L   CO2 28 22 - 32 mmol/L   Glucose, Bld 163 (H) 65 - 99 mg/dL   BUN 51 (H) 6 - 20 mg/dL   Creatinine, Ser 1.611.58 (H) 0.44 - 1.00 mg/dL   Calcium  6.5 (L) 8.9 - 10.3 mg/dL   GFR calc non Af Amer 42 (L) >60 mL/min   GFR calc Af Amer 49 (L) >60 mL/min   Anion gap 13 5 - 15  Hepatic function panel     Status: Abnormal   Collection Time: 02/16/16  5:52 AM  Result Value Ref Range   Total Protein 4.5 (L) 6.5 - 8.1 g/dL   Albumin 1.9 (L) 3.5 - 5.0 g/dL   AST 096219 (H) 15 - 41 U/L   ALT 287 (H) 14 - 54 U/L   Alkaline Phosphatase 82 38 - 126 U/L   Total Bilirubin 10.6 (H) 0.3 - 1.2 mg/dL   Bilirubin, Direct 5.9 (H) 0.1 - 0.5 mg/dL   Indirect Bilirubin 4.7 (H) 0.3 - 0.9 mg/dL    Assessment & Plan: Present on Admission:  . Fall from, out of or through building, not otherwise specified, initial encounter . Hypovolemic shock (HCC)   LOS: 7 days   Additional comments:I reviewed the patient's new clinical lab test results. and CXR Jump from hotel/multiple toxic ingestions S/P ex lap, hepatorraphy, SBR, cholecystectomy, preperitoneal pelvic packing [name] 4/3  Ex lap for acidosis/bleeding 4/4 Argusta Mcgann Ex lap for bleeding 4/5 [name] Possible celiac artery intimal injury - bowel viable on explorations S/P ex fix pelvis [name] 4/3 Resp failure - full support, tolerated coming down on FiO2 and PEEP Mult B rib FX/R HPTX, B pulm contisons - ssome improvement R effusion, CTs to water seal Multiple toxic ingestions - S/P EGD by GI, no esophageal injury seen. Acetaminophen level neg. No need for n acetylcystine per GI. Tylenol ingestion still likely caused liver damage. Persistent hypocalcemia may be due to chelation. Mult L wrist FXs - splint for now Hepatic failure - hemorrhagic shock plus ingestion of toxins, LFTs coming down AKI - U/O increased and CRT lower CV - steroids for possible adrenal insufficiency - continue today, ? Start weaning tomorrow ABL anemia - F/U Hyperglycemia - SSI ID - afeb, WBC higher, possible steroid effect. Central lines removed. PICC placed Thrombocytopenia - consumptive Pelvic FX - S/P ex fix and angioembolization,  for sacral screw by Dr. Carola FrostHandy today FEN - hypocalcemia - replace. TF held for OR VTE - PAS  Dispo - ICU, back to OR Critical Care Total Time*: 45 Minutes  [name] Morris Longenecker, MD, MPH, FACS Trauma: [phone] General Surgery: [phone]  02/16/2016  *Care during the described time interval was provided by me. I have reviewed this patient's available data, including medical history, events of note, physical examination and test results as part of my evaluation.

## 2016-02-16 NOTE — Anesthesia Postprocedure Evaluation (Signed)
Anesthesia Post Note  Patient: [name] Bell  Procedure(s) Performed: Procedure(s) (LRB): EXPLORATORY LAPAROTOMY, ABDOMINAL WASH OUT (N/A) SACRO-ILIAC PINNING (Right) RE-APPLICATION OF WOUND VAC (N/A)  Patient location during evaluation: SICU Anesthesia Type: General Level of consciousness: sedated Pain management: pain level controlled Vital Signs Assessment: post-procedure vital signs reviewed and stable Respiratory status: patient remains intubated per anesthesia plan Cardiovascular status: stable Anesthetic complications: no    Last Vitals:  Filed Vitals:   02/16/16 1542 02/16/16 1600  BP: 117/66 121/74  Pulse: 103   Temp:  36.4 C  Resp: 20 0    Last Pain:  Filed Vitals:   02/16/16 1639  PainSc: 0-No pain                 Kennieth RadFitzgerald, [place] E

## 2016-02-17 ENCOUNTER — Inpatient Hospital Stay (HOSPITAL_COMMUNITY): Payer: Medicaid Other

## 2016-02-17 ENCOUNTER — Encounter (HOSPITAL_COMMUNITY): Payer: Self-pay | Admitting: General Surgery

## 2016-02-17 LAB — GLUCOSE, CAPILLARY
GLUCOSE-CAPILLARY: 166 mg/dL — AB (ref 65–99)
GLUCOSE-CAPILLARY: 164 mg/dL — AB (ref 65–99)
GLUCOSE-CAPILLARY: 161 mg/dL — AB (ref 65–99)
GLUCOSE-CAPILLARY: 146 mg/dL — AB (ref 65–99)
Glucose-Capillary: 176 mg/dL — ABNORMAL HIGH (ref 65–99)
Glucose-Capillary: 157 mg/dL — ABNORMAL HIGH (ref 65–99)

## 2016-02-17 LAB — CBC
HCT: 22.2 % — ABNORMAL LOW (ref 36.0–46.0)
HCT: 24.4 % — ABNORMAL LOW (ref 36.0–46.0)
HEMATOCRIT: 15.3 % — AB (ref 36.0–46.0)
HEMOGLOBIN: 7.3 g/dL — AB (ref 12.0–15.0)
Hemoglobin: 5 g/dL — CL (ref 12.0–15.0)
Hemoglobin: 7.8 g/dL — ABNORMAL LOW (ref 12.0–15.0)
MCH: 29.1 pg (ref 26.0–34.0)
MCH: 29.8 pg (ref 26.0–34.0)
MCH: 29.1 pg (ref 26.0–34.0)
MCHC: 32.7 g/dL (ref 30.0–36.0)
MCHC: 32.9 g/dL (ref 30.0–36.0)
MCHC: 32 g/dL (ref 30.0–36.0)
MCV: 89 fL (ref 78.0–100.0)
MCV: 90.6 fL (ref 78.0–100.0)
MCV: 91 fL (ref 78.0–100.0)
PLATELETS: 84 10*3/uL — AB (ref 150–400)
Platelets: 86 10*3/uL — ABNORMAL LOW (ref 150–400)
Platelets: 72 10*3/uL — ABNORMAL LOW (ref 150–400)
RBC: 1.72 MIL/uL — ABNORMAL LOW (ref 3.87–5.11)
RBC: 2.45 MIL/uL — ABNORMAL LOW (ref 3.87–5.11)
RBC: 2.68 MIL/uL — AB (ref 3.87–5.11)
RDW: 16.5 % — AB (ref 11.5–15.5)
RDW: 16.7 % — ABNORMAL HIGH (ref 11.5–15.5)
RDW: 16.8 % — ABNORMAL HIGH (ref 11.5–15.5)
WBC: 59.5 10*3/uL (ref 4.0–10.5)
WBC: 48.2 10*3/uL — ABNORMAL HIGH (ref 4.0–10.5)
WBC: 46.7 10*3/uL — ABNORMAL HIGH (ref 4.0–10.5)

## 2016-02-17 LAB — BASIC METABOLIC PANEL
Anion gap: 14 (ref 5–15)
BUN: 53 mg/dL — AB (ref 6–20)
CALCIUM: 7 mg/dL — AB (ref 8.9–10.3)
CO2: 29 mmol/L (ref 22–32)
CREATININE: 1.34 mg/dL — AB (ref 0.44–1.00)
Chloride: 101 mmol/L (ref 101–111)
GFR calc Af Amer: 60 mL/min — ABNORMAL LOW (ref >60)
GFR calc non Af Amer: 52 mL/min — ABNORMAL LOW (ref >60)
GLUCOSE: 196 mg/dL — AB (ref 65–99)
Potassium: 3.4 mmol/L — ABNORMAL LOW (ref 3.5–5.1)
Sodium: 144 mmol/L (ref 135–145)

## 2016-02-17 LAB — HEMOGLOBIN AND HEMATOCRIT, BLOOD
HCT: 23.9 % — ABNORMAL LOW (ref 36.0–46.0)
HCT: 24.4 % — ABNORMAL LOW (ref 36.0–46.0)
HEMATOCRIT: 23.1 % — AB (ref 36.0–46.0)
HEMOGLOBIN: 7.8 g/dL — AB (ref 12.0–15.0)
Hemoglobin: 7.7 g/dL — ABNORMAL LOW (ref 12.0–15.0)
Hemoglobin: 8.1 g/dL — ABNORMAL LOW (ref 12.0–15.0)

## 2016-02-17 MED ORDER — CHLORHEXIDINE GLUCONATE 0.12 % MT SOLN
OROMUCOSAL | Status: AC
  Filled 2016-02-17: qty 15
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
## 2016-02-17 NOTE — Progress Notes (Signed)
CRITICAL VALUE ALERT  Critical value received:  Hgb 5.0/WBC 59.5 Date of notification:  4/11  Time of notification:  615  Critical value read back:Yes.    Nurse who received alert:  Lauren  MD notified (1st page):  Dr. Donell BeersByerly  Time of first page:  616  MD notified (2nd page):  Time of second page:   Responding MD:  Dr. Donell BeersByerly  Time MD responded:  616  Order received to repeat H&H to confirm results.

## 2016-02-18 ENCOUNTER — Inpatient Hospital Stay (HOSPITAL_COMMUNITY): Payer: Medicaid Other | Admitting: Anesthesiology

## 2016-02-18 ENCOUNTER — Encounter (HOSPITAL_COMMUNITY): Payer: Self-pay | Admitting: Certified Registered Nurse Anesthetist

## 2016-02-18 ENCOUNTER — Encounter (HOSPITAL_COMMUNITY): Admission: EM | Disposition: A | Discharge status: Rehabilitation Facility | Payer: Self-pay | Source: Home / Self Care

## 2016-02-18 HISTORY — PX: LAPAROTOMY: SHX154

## 2016-02-18 LAB — CBC WITH DIFFERENTIAL/PLATELET
BASOS PCT: 0 %
Band Neutrophils: 3 %
Basophils Absolute: 0 10*3/uL (ref 0.0–0.1)
Blasts: 0 %
EOS PCT: 0 %
Eosinophils Absolute: 0 10*3/uL (ref 0.0–0.7)
HEMATOCRIT: 23.8 % — AB (ref 36.0–46.0)
Hemoglobin: 7.7 g/dL — ABNORMAL LOW (ref 12.0–15.0)
LYMPHS ABS: 2.6 10*3/uL (ref 0.7–4.0)
LYMPHS PCT: 7 %
MCH: 30 pg (ref 26.0–34.0)
MCHC: 32.4 g/dL (ref 30.0–36.0)
MCV: 92.6 fL (ref 78.0–100.0)
MONO ABS: 0.7 10*3/uL (ref 0.1–1.0)
MONOS PCT: 2 %
Metamyelocytes Relative: 6 %
Myelocytes: 9 %
NEUTROS PCT: 73 %
NRBC: 8 /100{WBCs} — AB
Neutro Abs: 34.1 10*3/uL — ABNORMAL HIGH (ref 1.7–7.7)
OTHER: 0 %
PLATELETS: 90 10*3/uL — AB (ref 150–400)
Promyelocytes Absolute: 0 %
RBC: 2.57 MIL/uL — ABNORMAL LOW (ref 3.87–5.11)
RDW: 17.1 % — AB (ref 11.5–15.5)
WBC: 37.4 10*3/uL — ABNORMAL HIGH (ref 4.0–10.5)

## 2016-02-18 LAB — HEMOGLOBIN AND HEMATOCRIT, BLOOD
HCT: 27.3 % — ABNORMAL LOW (ref 36.0–46.0)
HCT: 25.8 % — ABNORMAL LOW (ref 36.0–46.0)
HEMATOCRIT: 24.8 % — AB (ref 36.0–46.0)
HEMOGLOBIN: 7.7 g/dL — AB (ref 12.0–15.0)
HEMOGLOBIN: 8.4 g/dL — AB (ref 12.0–15.0)
Hemoglobin: 8.5 g/dL — ABNORMAL LOW (ref 12.0–15.0)

## 2016-02-18 LAB — BASIC METABOLIC PANEL
Anion gap: 11 (ref 5–15)
BUN: 49 mg/dL — ABNORMAL HIGH (ref 6–20)
CHLORIDE: 112 mmol/L — AB (ref 101–111)
CO2: 28 mmol/L (ref 22–32)
Calcium: 6.7 mg/dL — ABNORMAL LOW (ref 8.9–10.3)
Creatinine, Ser: 1.02 mg/dL — ABNORMAL HIGH (ref 0.44–1.00)
GFR calc non Af Amer: >60 mL/min (ref >60)
Glucose, Bld: 468 mg/dL — ABNORMAL HIGH (ref 65–99)
POTASSIUM: 2.9 mmol/L — AB (ref 3.5–5.1)
Sodium: 151 mmol/L — ABNORMAL HIGH (ref 135–145)

## 2016-02-18 LAB — GLUCOSE, CAPILLARY
GLUCOSE-CAPILLARY: 122 mg/dL — AB (ref 65–99)
GLUCOSE-CAPILLARY: 90 mg/dL (ref 65–99)
Glucose-Capillary: 141 mg/dL — ABNORMAL HIGH (ref 65–99)
Glucose-Capillary: 122 mg/dL — ABNORMAL HIGH (ref 65–99)
Glucose-Capillary: 90 mg/dL (ref 65–99)

## 2016-02-18 SURGERY — LAPAROTOMY, EXPLORATORY
Anesthesia: General | Site: Abdomen

## 2016-02-18 MED ORDER — FENTANYL CITRATE (PF) 250 MCG/5ML IJ SOLN
INTRAMUSCULAR | Status: AC
  Filled 2016-02-18: qty 5

## 2016-02-18 MED ORDER — ROCURONIUM BROMIDE 50 MG/5ML IV SOLN
INTRAVENOUS | Status: AC
  Filled 2016-02-18: qty 1

## 2016-02-18 MED ORDER — MIDAZOLAM HCL 2 MG/2ML IJ SOLN
INTRAMUSCULAR | Status: AC
  Filled 2016-02-18: qty 2

## 2016-02-18 MED ORDER — MIDAZOLAM HCL 5 MG/5ML IJ SOLN
INTRAMUSCULAR | Status: DC | PRN
  Administered 2016-02-18: 2 mg via INTRAVENOUS

## 2016-02-18 MED ORDER — 0.9 % SODIUM CHLORIDE (POUR BTL) OPTIME
TOPICAL | Status: DC | PRN
  Administered 2016-02-18: 1000 mL
  Administered 2016-02-18: 2000 mL

## 2016-02-18 MED ORDER — FENTANYL CITRATE (PF) 100 MCG/2ML IJ SOLN
INTRAMUSCULAR | Status: DC | PRN
  Administered 2016-02-18: 50 ug via INTRAVENOUS
  Administered 2016-02-18 (×2): 100 ug via INTRAVENOUS

## 2016-02-18 MED ORDER — VECURONIUM BROMIDE 10 MG IV SOLR
INTRAVENOUS | Status: DC | PRN
  Administered 2016-02-18: 5 mg via INTRAVENOUS

## 2016-02-18 MED ORDER — ROCURONIUM BROMIDE 100 MG/10ML IV SOLN
INTRAVENOUS | Status: DC | PRN
  Administered 2016-02-18 (×2): 50 mg via INTRAVENOUS

## 2016-02-18 MED ORDER — VECURONIUM BROMIDE 10 MG IV SOLR
INTRAVENOUS | Status: AC
  Filled 2016-02-18: qty 10

## 2016-02-18 MED ORDER — LACTATED RINGERS IV SOLN
INTRAVENOUS | Status: DC | PRN
  Administered 2016-02-18: 12:00:00 via INTRAVENOUS

## 2016-02-18 SURGICAL SUPPLY — 51 items
BAG DECANTER FOR FLEXI CONT (MISCELLANEOUS) ×2 IMPLANT
BENZOIN TINCTURE PRP APPL 2/3 (GAUZE/BANDAGES/DRESSINGS) ×2 IMPLANT
BLADE SURG ROTATE 9660 (MISCELLANEOUS) IMPLANT
CANISTER SUCTION 2500CC (MISCELLANEOUS) ×2 IMPLANT
CHLORAPREP W/TINT 26ML (MISCELLANEOUS) ×2 IMPLANT
COVER SURGICAL LIGHT HANDLE (MISCELLANEOUS) ×2 IMPLANT
DRAPE INCISE IOBAN 85X60 (DRAPES) ×2 IMPLANT
DRAPE LAPAROSCOPIC ABDOMINAL (DRAPES) ×2 IMPLANT
DRAPE WARM FLUID 44X44 (DRAPE) ×2 IMPLANT
DRSG OPSITE POSTOP 4X10 (GAUZE/BANDAGES/DRESSINGS) IMPLANT
DRSG OPSITE POSTOP 4X8 (GAUZE/BANDAGES/DRESSINGS) IMPLANT
DRSG VAC ATS LRG SENSATRAC (GAUZE/BANDAGES/DRESSINGS) ×2 IMPLANT
DRSG VAC ATS MED SENSATRAC (GAUZE/BANDAGES/DRESSINGS) ×2 IMPLANT
DURAPREP 26ML APPLICATOR (WOUND CARE) ×2 IMPLANT
ELECT BLADE 6.5 EXT (BLADE) IMPLANT
ELECT CAUTERY BLADE 6.4 (BLADE) ×2 IMPLANT
ELECT REM PT RETURN 9FT ADLT (ELECTROSURGICAL) ×2
ELECTRODE REM PT RTRN 9FT ADLT (ELECTROSURGICAL) ×1 IMPLANT
GLOVE BIOGEL PI IND STRL 7.0 (GLOVE) ×2 IMPLANT
GLOVE BIOGEL PI IND STRL 8 (GLOVE) ×2 IMPLANT
GLOVE BIOGEL PI INDICATOR 7.0 (GLOVE) ×2
GLOVE BIOGEL PI INDICATOR 8 (GLOVE) ×2
GLOVE ECLIPSE 7.5 STRL STRAW (GLOVE) ×2 IMPLANT
GLOVE SURG SS PI 7.0 STRL IVOR (GLOVE) ×2 IMPLANT
GLOVE SURG SS PI 8.0 STRL IVOR (GLOVE) ×2 IMPLANT
GOWN STRL REUS W/ TWL LRG LVL3 (GOWN DISPOSABLE) ×3 IMPLANT
GOWN STRL REUS W/TWL LRG LVL3 (GOWN DISPOSABLE) ×3
KIT BASIN OR (CUSTOM PROCEDURE TRAY) ×2 IMPLANT
KIT ROOM TURNOVER OR (KITS) ×2 IMPLANT
LIGASURE IMPACT 36 18CM CVD LR (INSTRUMENTS) IMPLANT
NS IRRIG 1000ML POUR BTL (IV SOLUTION) ×6 IMPLANT
PACK GENERAL/GYN (CUSTOM PROCEDURE TRAY) ×2 IMPLANT
PAD ARMBOARD 7.5X6 YLW CONV (MISCELLANEOUS) ×2 IMPLANT
PLUG CATH AND CAP STER (CATHETERS) ×2 IMPLANT
SEPRAFILM PROCEDURAL PACK 3X5 (MISCELLANEOUS) IMPLANT
SET ABDOMINAL WALL CLOSURE (MISCELLANEOUS) ×2 IMPLANT
SET ABDOMINAL WALL CLS EXT 30 (MISCELLANEOUS) ×2 IMPLANT
SPECIMEN JAR LARGE (MISCELLANEOUS) IMPLANT
SPONGE LAP 18X18 X RAY DECT (DISPOSABLE) IMPLANT
STAPLER VISISTAT 35W (STAPLE) ×2 IMPLANT
SUCTION POOLE TIP (SUCTIONS) ×2 IMPLANT
SUT ETHILON 2 0 FS 18 (SUTURE) ×2 IMPLANT
SUT NOVA 1 T20/GS 25DT (SUTURE) IMPLANT
SUT PDS AB 1 TP1 96 (SUTURE) IMPLANT
SUT SILK 2 0 SH CR/8 (SUTURE) ×2 IMPLANT
SUT SILK 2 0 TIES 10X30 (SUTURE) ×2 IMPLANT
SUT SILK 3 0 SH CR/8 (SUTURE) ×2 IMPLANT
SUT SILK 3 0 TIES 10X30 (SUTURE) ×2 IMPLANT
TOWEL OR 17X26 10 PK STRL BLUE (TOWEL DISPOSABLE) ×2 IMPLANT
TRAY FOLEY CATH 16FRSI W/METER (SET/KITS/TRAYS/PACK) IMPLANT
YANKAUER SUCT BULB TIP NO VENT (SUCTIONS) ×2 IMPLANT

## 2016-02-18 NOTE — Anesthesia Procedure Notes (Signed)
Date/Time: 02/18/2016 11:55 AM Performed by: Rosalio MacadamiaHAYES, Halena Mohar T Pre-anesthesia Checklist: Patient identified, Emergency Drugs available, Suction available and Patient being monitored Patient Re-evaluated:Patient Re-evaluated prior to inductionOxygen Delivery Method: Circle system utilized Intubation Type: Inhalational induction with existing ETT Placement Confirmation: positive ETCO2 and breath sounds checked- equal and bilateral Dental Injury: Teeth and Oropharynx as per pre-operative assessment
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
## 2016-02-18 NOTE — Transfer of Care (Signed)
Immediate Anesthesia Transfer of Care Note  Patient: [name] Bell  Procedure(s) Performed: Procedure(s): EXPLORATORY LAPAROTOMY, PLACEMENT OF ABRA ABDOMINAL WALL CLOSURE SET (N/A) GASTROSTOMY TUBE (N/A)  Patient Location: ICU  Anesthesia Type:General  Level of Consciousness: sedated and Patient remains intubated per anesthesia plan  Airway & Oxygen Therapy: Patient remains intubated per anesthesia plan and Patient placed on Ventilator (see vital sign flow sheet for setting)  Post-op Assessment: Report given to RN and Post -op Vital signs reviewed and stable  Post vital signs: Reviewed and stable  Last Vitals:  Filed Vitals:   02/18/16 1000 02/18/16 1100  BP: 132/68 130/69  Pulse:    Temp:    Resp: 20 18    Complications: No apparent anesthesia complications

## 2016-02-18 NOTE — Op Note (Signed)
OPERATIVE REPORT  DATE OF OPERATION:  02/18/2016  PATIENT:  [name] Bell  35 y.o. female  PRE-OPERATIVE DIAGNOSIS:  OPEN ABDOMEN  POST-OPERATIVE DIAGNOSIS:  OPEN ABDOMEN WITH LOSS OF DOMAIN  FINDINGS:  Fascial defect 34 x 28 cm  PROCEDURE:  Procedure(s): EXPLORATORY LAPAROTOMY, PLACEMENT OF ABRA ABDOMINAL WALL CLOSURE SET GASTROSTOMY TUBE  SURGEON:  Surgeon(s): Jimmye Norman, MD Gaynelle Adu, MD  ASSISTANT: Andrey Campanile, M.D. And Leotis Shames, PA-C  ANESTHESIA:   general  COMPLICATIONS:  None  EBL: <50 ml  BLOOD ADMINISTERED: none  DRAINS: Nasogastric Tube, Urinary Catheter (Foley), Gastrostomy Tube and NPWD with ABRA device   SPECIMEN:  No Specimen  COUNTS CORRECT:  YES  PROCEDURE DETAILS: The patient was taken to the operating room and placed on the table in the supine position. She came directly from the intensive care unit, therefore she was intubated and somewhat sedated and placed on the operating room table.  A proper timeout was performed identifying the patient and procedure to be done early in the process. The external portion of her negative pressure wound dressing was removed. She was then prepped and draped in the usual sterile manner with Betadine.  The inner portion of the negative pressure wound dressing was removed. We subsequently irrigated the peritoneal cavity with saline solution up to approximately a liter and a half to 2 L of saline. There was no acute bleeding. There was old blood in the pelvis but no acute hemorrhagic process.  Because of the patient's long-term need for nutritional support and the likelihood that we will not be able to place a gastrostomy tube down the road we performed a Stamm gastrostomy tube today. 2 concentric pursestring sutures of 2-0 silk were placed on the anterior gastric wall. In the center of the inner most pursestring suture of silk we entered the gastric lumen using electrocautery. Through this gastrotomy we placed a 24 French  Mallinckrodt tube and secured in place with the silk sutures. We subsequently brought out the tube through the anterior abdominal wall on the lateral side on the left approximately 8 cm lateral to the skin margin.  We secured in that position using a 2-0 nylon suture.  The rest the procedure was dedicated to placement of the ABRA device.  On the skin surface we marked the lateral margins of our buttons to be place and the Silastic strings which were 5 cm to 6 cm from the fascial edge. This was done on both sides. Subsequently starting at the upper portion of the incision we marked every 3 cm on the line of the elastic polymer string was to come out. At each site where the string was to be brought out up to a maximum of 9 per side of the patient, a small incision in the skin the size of the tip of a cautery tip was made using electrocautery. We subsequently placed a large piece of Ioban across the abdominal wall partially covering the gastrostomy tube and covering both sides that were marked on the anterior abdominal wall.  We cut out the center of the Ioban covering the bowel. Subsequently using a fish bowel protector in place, the cannulated was used to retrieve the Silastic strings from the peritoneal cavity on each side of the 9 spots chosen previously. This is after small incisions were made in the Ioban exercise of the previous incisions. With the Silastic bands or strings in place we subsequently used a short intra-abdominal Silastic or silicone bowel protector this had to be  cut to fit around gastrostomy tube in the left upper quadrant. The Silastic bowel protector was passed underneath the abdominal wall laterally with the cut out portion surrounding the gastrostomy tube exit site. The smaller shorter piece was placed in the lower portion of the abdomen to completely cover the bowel and protected. Once this was in place we were able to lift up on the Silastic bands. This secured down the bowel  protector in place and we subsequently placed tension on the Silastic bands across the abdominal wall using tension from both sides splaying out the marker at least twice its length and attaching it to the button holder on each side of the abdominal wall that it then measured off at the 9 previously selected sites. The inner tube silicone retention tube is underneath the bands and the premade slit in the 2 was used to hold the Silastic bands off the silicone bowel protector and providing additional layer of support and protection against bowel injury.  We placed each band through one of the slits in the internal tubular silicone to protector. We subsequently stretched the Silastic bands or strings to the appropriate length. The internal silicone tube was cut to appropriate length also not cause any internal injury.  Subsequently placed a negative pressure wound phone dressing in the wound completely covering the Silastic strings and bowel protectors. Plastic covering was laid down appropriately on the black sponge and we got an excellent seal. InterDry cloth was passed underneath the buttons. A securing pale with an adhesive tape and a hook attached to the buttons secured them in place. All counts were correct.  PATIENT DISPOSITION:  ICU - intubated and hemodynamically stable.   Bradley Handyside 4/12/20172:08 PM

## 2016-02-18 NOTE — Clinical Social Work Note (Signed)
Clinical Social Worker continuing to follow patient and family for support. Patient is going back to the OR today for partial closure, with hopes of weaning the ventilator later today.  Patient will be followed by Psych CSW once extubated and need for placement is in place. CSW remains available for support and to assist with patient needs as needed.  Macario GoldsJesse Leidy Massar, Kentucky[place]CS[place] [phone]

## 2016-02-19 ENCOUNTER — Encounter (HOSPITAL_COMMUNITY): Payer: Self-pay | Admitting: General Surgery

## 2016-02-19 ENCOUNTER — Inpatient Hospital Stay (HOSPITAL_COMMUNITY): Payer: Medicaid Other

## 2016-02-19 LAB — CBC
HCT: 24.7 % — ABNORMAL LOW (ref 36.0–46.0)
Hemoglobin: 7.8 g/dL — ABNORMAL LOW (ref 12.0–15.0)
MCH: 29.1 pg (ref 26.0–34.0)
MCHC: 31.6 g/dL (ref 30.0–36.0)
MCV: 92.2 fL (ref 78.0–100.0)
PLATELETS: 149 10*3/uL — AB (ref 150–400)
RBC: 2.68 MIL/uL — AB (ref 3.87–5.11)
RDW: 19.3 % — AB (ref 11.5–15.5)
WBC: 35.8 10*3/uL — AB (ref 4.0–10.5)

## 2016-02-19 LAB — GLUCOSE, CAPILLARY
GLUCOSE-CAPILLARY: 114 mg/dL — AB (ref 65–99)
GLUCOSE-CAPILLARY: 137 mg/dL — AB (ref 65–99)
GLUCOSE-CAPILLARY: 127 mg/dL — AB (ref 65–99)
GLUCOSE-CAPILLARY: 123 mg/dL — AB (ref 65–99)
GLUCOSE-CAPILLARY: 158 mg/dL — AB (ref 65–99)
Glucose-Capillary: 162 mg/dL — ABNORMAL HIGH (ref 65–99)

## 2016-02-19 LAB — BASIC METABOLIC PANEL
ANION GAP: 13 (ref 5–15)
BUN: 46 mg/dL — ABNORMAL HIGH (ref 6–20)
CALCIUM: 7.1 mg/dL — AB (ref 8.9–10.3)
CO2: 27 mmol/L (ref 22–32)
CREATININE: 1 mg/dL (ref 0.44–1.00)
Chloride: 114 mmol/L — ABNORMAL HIGH (ref 101–111)
Glucose, Bld: 412 mg/dL — ABNORMAL HIGH (ref 65–99)
Potassium: 3.2 mmol/L — ABNORMAL LOW (ref 3.5–5.1)
Sodium: 154 mmol/L — ABNORMAL HIGH (ref 135–145)

## 2016-02-19 LAB — CBC WITH DIFFERENTIAL/PLATELET
BASOS ABS: 0.3 10*3/uL — AB (ref 0.0–0.1)
BASOS PCT: 1 %
EOS ABS: 0 10*3/uL (ref 0.0–0.7)
Eosinophils Relative: 0 %
HCT: 24.8 % — ABNORMAL LOW (ref 36.0–46.0)
HEMOGLOBIN: 7.7 g/dL — AB (ref 12.0–15.0)
Lymphocytes Relative: 3 %
Lymphs Abs: 1 10*3/uL (ref 0.7–4.0)
MCH: 29.1 pg (ref 26.0–34.0)
MCHC: 31 g/dL (ref 30.0–36.0)
MCV: 93.6 fL (ref 78.0–100.0)
MONO ABS: 2.1 10*3/uL — AB (ref 0.1–1.0)
Monocytes Relative: 6 %
NEUTROS ABS: 31.1 10*3/uL — AB (ref 1.7–7.7)
NEUTROS PCT: 90 %
PLATELETS: 121 10*3/uL — AB (ref 150–400)
RBC: 2.65 MIL/uL — AB (ref 3.87–5.11)
RDW: 19 % — AB (ref 11.5–15.5)
WBC: 34.5 10*3/uL — ABNORMAL HIGH (ref 4.0–10.5)

## 2016-02-19 MED ORDER — FREE WATER
200.0000 mL | Freq: Three times a day (TID) | Status: DC
  Administered 2016-02-19 – 2016-02-20 (×4): 200 mL

## 2016-02-19 MED ORDER — SODIUM CHLORIDE 0.9 % IV SOLN
1.0000 g | Freq: Once | INTRAVENOUS | Status: AC
  Administered 2016-02-19: 1 g via INTRAVENOUS
  Filled 2016-02-19: qty 10

## 2016-02-19 MED ORDER — POTASSIUM CHLORIDE 10 MEQ/50ML IV SOLN
10.0000 meq | INTRAVENOUS | Status: AC
  Administered 2016-02-19 (×4): 10 meq via INTRAVENOUS
  Filled 2016-02-19 (×4): qty 50

## 2016-02-19 NOTE — Progress Notes (Signed)
Patient ID: [name] Bell, female   DOB: Dec 13, 1980, 35 y.o.   MRN: [id number] We did "the move" at bedside for myofascial release. [name], MD, MPH, FACS Trauma: [phone] General Surgery: [phone]

## 2016-02-19 NOTE — Anesthesia Postprocedure Evaluation (Addendum)
Anesthesia Post Note  Patient: Phylliss BobKatia D Old  Procedure(s) Performed: Procedure(s) (LRB): EXPLORATORY LAPAROTOMY, PLACEMENT OF ABRA ABDOMINAL WALL CLOSURE SET (N/A) GASTROSTOMY TUBE (N/A)  Patient location during evaluation: ICU Anesthesia Type: General Level of consciousness: sedated Pain management: pain level controlled Vital Signs Assessment: post-procedure vital signs reviewed and stable Respiratory status: patient remains intubated per anesthesia plan Cardiovascular status: stable Anesthetic complications: no    Last Vitals:  Filed Vitals:   02/19/16 0800 02/19/16 0824  BP: 122/68 122/68  Pulse: 92 88  Temp: 37.4 C   Resp: 20     Last Pain:  Filed Vitals:   02/19/16 0840  PainSc: 0-No pain                 Tarren Sabree S
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
## 2016-02-20 ENCOUNTER — Inpatient Hospital Stay (HOSPITAL_COMMUNITY): Payer: Medicaid Other

## 2016-02-20 DIAGNOSIS — J9601 Acute respiratory failure with hypoxia: Secondary | ICD-10-CM

## 2016-02-20 LAB — TYPE AND SCREEN
ABO/RH(D): O POS
Antibody Screen: NEGATIVE
UNIT DIVISION: 0
UNIT DIVISION: 0
Unit division: 0
Unit division: 0

## 2016-02-20 LAB — BASIC METABOLIC PANEL
ANION GAP: 13 (ref 5–15)
BUN: 56 mg/dL — ABNORMAL HIGH (ref 6–20)
CALCIUM: 8.1 mg/dL — AB (ref 8.9–10.3)
CO2: 30 mmol/L (ref 22–32)
CREATININE: 1.05 mg/dL — AB (ref 0.44–1.00)
Chloride: 116 mmol/L — ABNORMAL HIGH (ref 101–111)
Glucose, Bld: 171 mg/dL — ABNORMAL HIGH (ref 65–99)
Potassium: 3.2 mmol/L — ABNORMAL LOW (ref 3.5–5.1)
Sodium: 159 mmol/L — ABNORMAL HIGH (ref 135–145)

## 2016-02-20 LAB — CBC
HEMATOCRIT: 26.1 % — AB (ref 36.0–46.0)
Hemoglobin: 8.2 g/dL — ABNORMAL LOW (ref 12.0–15.0)
MCH: 29.1 pg (ref 26.0–34.0)
MCHC: 31.4 g/dL (ref 30.0–36.0)
MCV: 92.6 fL (ref 78.0–100.0)
PLATELETS: 179 10*3/uL (ref 150–400)
RBC: 2.82 MIL/uL — ABNORMAL LOW (ref 3.87–5.11)
RDW: 20 % — AB (ref 11.5–15.5)
WBC: 35.9 10*3/uL — AB (ref 4.0–10.5)

## 2016-02-20 LAB — GLUCOSE, CAPILLARY
GLUCOSE-CAPILLARY: 154 mg/dL — AB (ref 65–99)
GLUCOSE-CAPILLARY: 172 mg/dL — AB (ref 65–99)
GLUCOSE-CAPILLARY: 148 mg/dL — AB (ref 65–99)
GLUCOSE-CAPILLARY: 146 mg/dL — AB (ref 65–99)
Glucose-Capillary: 121 mg/dL — ABNORMAL HIGH (ref 65–99)
Glucose-Capillary: 136 mg/dL — ABNORMAL HIGH (ref 65–99)
Glucose-Capillary: 164 mg/dL — ABNORMAL HIGH (ref 65–99)

## 2016-02-20 MED ORDER — ENOXAPARIN SODIUM 40 MG/0.4ML ~~LOC~~ SOLN
40.0000 mg | Freq: Two times a day (BID) | SUBCUTANEOUS | Status: DC
  Administered 2016-02-20 – 2016-02-24 (×10): 40 mg via SUBCUTANEOUS
  Filled 2016-02-20 (×10): qty 0.4

## 2016-02-20 MED ORDER — POTASSIUM CHLORIDE 2 MEQ/ML IV SOLN
INTRAVENOUS | Status: DC
  Administered 2016-02-20 – 2016-02-22 (×4): via INTRAVENOUS
  Filled 2016-02-20 (×7): qty 1000

## 2016-02-20 MED ORDER — FREE WATER
400.0000 mL | Freq: Three times a day (TID) | Status: DC
  Administered 2016-02-20 – 2016-02-21 (×3): 400 mL

## 2016-02-20 NOTE — Progress Notes (Signed)
Patient found on full vent support.
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
## 2016-02-20 NOTE — Progress Notes (Signed)
[facility] Progress Note  Subjective  Remains intubated Trauma team at bedside getting ready to do vac change  Ortho issues unchanged  ROS As above   Objective   BP 122/63 mmHg  Pulse 89  Temp(Src) 100.7 F (38.2 C) (Axillary)  Resp 20  Ht 5\' 11"  (1.803 m)  Wt 135.9 kg (299 lb 9.7 oz)  BMI 41.80 kg/m2  SpO2 95%  LMP  (LMP Unknown)  Intake/Output      04/13 0701 - 04/14 0700 04/14 0701 - 04/15 0700   I.V. (mL/kg) 1800 (13.2) 50 (0.4)   NG/GT 760    IV Piggyback 350    Total Intake(mL/kg) 2910 (21.4) 50 (0.4)   Urine (mL/kg/hr) 2750 (0.8)    Drains 350 (0.1)    Blood     Chest Tube 110 (0)    Total Output 3210     Net -300 +50           Exam  Gen: intubated  Pelvis: ex fix in place  Ext:      Unable to assess at this time   Assessment and Plan   POD/HD#: 4  -Complex pelvic ring fracture  S/p SI screw fixation x 2 with retention of pelvic ex fix    Post op xrays look good  Follow up next week to see if pt stable for anterior plating o/w will tx definitively in ex fix   - DVT/PE prophylaxis  SCD's   -Dispo  Cont with ICU care   Mearl LatinKeith W. Rendi Mapel, PA-C [facility] [phone](P) [phone] (O) 02/20/2016 9:13 AM

## 2016-02-20 NOTE — Procedures (Signed)
Procedure: ABRA open abdomen assisted closure device change  Indication: Open abdomen  Surgeon: Jimmye NormanJames [name], MD  Asssit: Charma IgoMichael Doyt Castellana, PA-C  Anesthesia: 400mcg fentanyl IV  EBL: none  Complicaitons: None  Dispo: ICU    Freeman CaldronMichael J. Lakelynn Severtson, PA-C Pager: [phone] General Trauma PA Pager: [phone]

## 2016-02-20 NOTE — Progress Notes (Signed)
Patient ID: [name] Bell, female   DOB: [birth year], 35 y.o.   MRN: [id number]   LOS: 11 days   Subjective: Sedated, on vent, +FC, answers questions   Objective: Vital signs in last 24 hours: Temp:  [97.5 F (36.4 C)-101 F (38.3 C)] 100.7 F (38.2 C) (04/14 0800) Pulse Rate:  [87-103] 98 (04/14 0900) Resp:  [11-24] 24 (04/14 0900) BP: (91-122)/(54-82) 121/64 mmHg (04/14 0900) SpO2:  [93 %-99 %] 93 % (04/14 0900) FiO2 (%):  [40 %] 40 % (04/14 0900) Weight:  [135.9 kg (299 lb 9.7 oz)] 135.9 kg (299 lb 9.7 oz) (04/14 0423) Last BM Date:  (pta)   VENT: PRVC/40%/8PEEP/RR20/Vt44590ml   UOP: >12600ml/h NET: -23250ml/24h TOTAL: +35510589ml/admission   CT#1 No air leak 4630ml/24h  CT#2 No air leak 7880ml/24h   Laboratory CBC  Recent Labs  02/19/16 1515 02/20/16 0530  WBC 35.8* 35.9*  HGB 7.8* 8.2*  HCT 24.7* 26.1*  PLT 149* 179   BMET  Recent Labs  02/19/16 0530 02/20/16 0530  NA 154* 159*  K 3.2* 3.2*  CL 114* 116*  CO2 27 30  GLUCOSE 412* 171*  BUN 46* 56*  CREATININE 1.00 1.05*  CALCIUM 7.1* 8.1*   CBG (last 3)   Recent Labs  02/19/16 2343 02/20/16 0321 02/20/16 0802  GLUCAP 164* 154* 172*    Radiology PORTABLE CHEST 1 VIEW  COMPARISON: 02/19/2016 .  FINDINGS: Interim removal of NG tube. Endotracheal tube, right PICC line, right chest tubes in stable position . Diffuse right lung infiltrate and right pleural effusion again noted . Left lower lobe atelectasis and/or infiltrate again noted . No pneumothorax. Multiple right rib fractures again noted.  IMPRESSION: 1. Lines and tubes including 2 right chest tube in stable position. No pneumothorax.  2. Diffuse right lung infiltrate and right pleural effusion again noted. Left lower lobe atelectasis and/or infiltrate again noted No interim change.  3. Multiple right rib fractures again noted. No pneumothorax.   Electronically Signed  By: Maisie Fushomas Register  On: 02/20/2016  08:01   Physical Exam General appearance: alert and no distress Resp: clear to auscultation bilaterally Cardio: regular rate and rhythm GI: Diminished BS, ABRA in place   Assessment/Plan: Jump from hotel/multiple toxic ingestions S/P ex lap, hepatorraphy, SBR, cholecystectomy, preperitoneal pelvic packing [name] 4/3  Ex lap for acidosis/bleeding 4/4 Thompson Ex lap for bleeding 4/5 [name] Possible celiac artery intimal injury - bowel viable on explorations S/P ex fix pelvis [name] 4/3 S/P ex lap 4/7 Thompson S/P ex lap 4/10 Thompson S/P sacral screws 4/10 [name] S/P application ABRA 4/12 [name] -- Changed at bedside today using 200mcg fentanyl bolus x2, likely will need Versed in addition for next change Resp failure - CCM managing vent over the weekend Mult B rib FX/R HPTX, B pulm contisons - ssome improvement R effusion, CTs on water seal but output still too high to D/C Multiple toxic ingestions Mult L wrist FXs - splint for now Pelvic FX - S/P ex fix and angioembolization, S/P sacral screw by Dr. Carola FrostHandy Hepatic failure - hemorrhagic shock plus ingestion of toxins, LFTs coming down AKI - U/O increased and CRT lower ABL anemia - Improved Hyperglycemia - SSI ID - afeb, WBC elevated and stable FEN - Replete K and Ca, increase free water and change IVF for hypernatremia VTE - SCD's,  Dispo - ICU    Freeman CaldronMichael J. Mckell Riecke, PA-C Pager: [phone] General Trauma PA Pager: [phone]  02/20/2016

## 2016-02-20 NOTE — Consult Note (Signed)
PULMONARY / CRITICAL CARE MEDICINE   Name: [name] Bell MRN: [id number] DOB: June 13, 1981    ADMISSION DATE:  02/09/2016 CONSULTATION DATE:  4/14  REFERRING MD: Trauma  CHIEF COMPLAINT:  VDRF  HISTORY OF PRESENT ILLNESS:   35 yo MO(343 lbs) AAF, recently moved to [place], who wrote DNR on her chest, drank Drano, and leaped from 5 th floor of [place] on her abd. She was awake when EMS arrived. She sustained multiple injuries including 1. SB injury, 2. GB injury, Pelvic fracture and was transferred to [facility] and to Trauma service. She has had and continued to receive mu;tiple invasive procedures. Currently day#11 of OTT(4/14), open abd with wound vac in place, needs further surgical pelvic interventions. 2 chest tubes on the right with no air leak. PCCM has been asked to manage the ventilator over the weekend.  PAST MEDICAL HISTORY :  She  has no past medical history on file.  PAST SURGICAL HISTORY: She  has past surgical history that includes Esophagogastroduodenoscopy (N/A, 02/10/2016); laparotomy (N/A, 02/10/2016); laparotomy (N/A, 02/11/2016); Chest tube insertion (Right, 02/11/2016); laparotomy (N/A, 02/09/2016); Cholecystectomy (02/09/2016); Bowel resection (02/09/2016); Application if wound vac (02/09/2016); Laceration repair (02/09/2016); External fixation pelvis (02/09/2016); laparotomy (N/A, 02/13/2016); laparotomy (N/A, 02/16/2016); Application if wound vac (N/A, 02/16/2016); Sacro-iliac pinning (Right, 02/16/2016); and laparotomy (N/A, 02/18/2016).  No Known Allergies  No current facility-administered medications on file prior to encounter.   No current outpatient prescriptions on file prior to encounter.    FAMILY HISTORY:  Her has no family status information on file.   SOCIAL HISTORY: She    REVIEW OF SYSTEMS:   na  SUBJECTIVE:  NAD at rest  VITAL SIGNS: BP 121/64 mmHg  Pulse 98  Temp(Src) 100.7 F (38.2 C) (Axillary)  Resp 24  Ht  (1.803 m)  Wt 299 lb 9.7 oz  (135.9 kg)  BMI 41.80 kg/m2  SpO2 93%  LMP  (LMP Unknown)  HEMODYNAMICS: CVP:  [15 mmHg-18 mmHg] 18 mmHg  VENTILATOR SETTINGS: Vent Mode:  [-] PRVC FiO2 (%):  [40 %] 40 % Set Rate:  [20 bmp] 20 bmp Vt Set:  [490 mL] 490 mL PEEP:  [8 cmH20] 8 cmH20 Plateau Pressure:  [23 cmH20-24 cmH20] 23 cmH20  INTAKE / OUTPUT: I/O last 3 completed shifts: In: 3755 [I.V.:2515; NG/GT:790; IV Piggyback:450] Out: 5160 [Urine:4250; Drains:800; Chest Tube:110]  PHYSICAL EXAMINATION: General: MOAAF on vent anf follows some commands Neuro:  Tracks and follows comands HEENT:  Ott-> vent Cardiovascular:  HSR RRR Lungs: coarse bs rt >l, chest tubes x 2 right, no air leak Abdomen:  Open aabd wound with vac in place Musculoskeletal:  Pelvic remains unstable Skin:  warm  LABS:  BMET  Recent Labs Lab 02/18/16 0840 02/19/16 0530 02/20/16 0530  NA 151* 154* 159*  K 2.9* 3.2* 3.2*  CL 112* 114* 116*  CO2 BUN 49* 46* 56*  CREATININE 1.02* 1.00 1.05*  GLUCOSE 468* 412* 171*    Electrolytes  Recent Labs Lab 02/18/16 0840 02/19/16 0530 02/20/16 0530  CALCIUM 6.7* 7.1* 8.1*    CBC  Recent Labs Lab 02/19/16 0530 02/19/16 1515 02/20/16 0530  WBC 34.5* 35.8* 35.9*  HGB 7.7* 7.8* 8.2*  HCT 24.8* 24.7* 26.1*  PLT 121* 149* 179    Coag's  Recent Labs Lab 02/13/16 1620  INR 1.75*    Sepsis Markers No results for input(s): LATICACIDVEN, PROCALCITON, O2SATVEN in the last 168 hours.  ABG  Recent Labs Lab 02/14/16  1336 02/15/16 0438 02/16/16 0420  PHART 7.523* 7.524* 7.483*  PCO2ART 31.8* 32.7* 39.5  PO2ART 75.0* 106* 78.0*    Liver Enzymes  Recent Labs Lab 02/14/16 0451 02/15/16 0600 02/16/16 0552  AST 597* 324* 219*  ALT 493* 368* 287*  ALKPHOS 72 84 82  BILITOT 9.0* 11.5* 10.6*  ALBUMIN 1.8* 1.8* 1.9*    Cardiac Enzymes No results for input(s): TROPONINI, PROBNP in the last 168 hours.  Glucose  Recent Labs Lab 02/19/16 1155  02/19/16 1537 02/19/16 1930 02/19/16 2343 02/20/16 0321 02/20/16 0802  GLUCAP 123* 162* 158* 164* 154* 172*    Imaging Dg Chest Port 1 View  02/20/2016  CLINICAL DATA:  Respiratory failure. EXAM: PORTABLE CHEST 1 VIEW COMPARISON:  02/19/2016 . FINDINGS: Interim removal of NG tube. Endotracheal tube, right PICC line, right chest tubes in stable position . Diffuse right lung infiltrate and right pleural effusion again noted . Left lower lobe atelectasis and/or infiltrate again noted . No pneumothorax. Multiple right rib fractures again noted. IMPRESSION: 1. Lines and tubes including 2 right chest tube in stable position. No pneumothorax. 2. Diffuse right lung infiltrate and right pleural effusion again noted. Left lower lobe atelectasis and/or infiltrate again noted No interim change. 3.  Multiple right rib fractures again noted.  No pneumothorax. Electronically Signed   By: Maisie Fushomas  Register   On: 02/20/2016 08:01     STUDIES:    CULTURES: None  ANTIBIOTICS: 4/5 zoysn>>  SIGNIFICANT EVENTS: 4/3 suicide atempt  LINES/TUBES: 4/3 OTT>>  DISCUSSION: MO 343 lbs AAF suicide attempt with leap from 5 story building with pelvic abd trauma  ASSESSMENT / PLAN:  PULMONARY A: VDRF post ingestion drano, suicide leap from 5 story building RT rib fx and Pnx with chest tube x 2 Open abd Pelvic fx  P:   No extubation with open abd BD Vent bundle Consider early tracheostomy, day #11 on 4/14 of OTT  CARDIOVASCULAR A:  No acute issue P:  Per trauma   RENAL Lab Results  Component Value Date   CREATININE 1.05* 02/20/2016   CREATININE 1.00 02/19/2016   CREATININE 1.02* 02/18/2016    A:   Mild renal insuff P:   Follow creatine per trauma   GASTROINTESTINAL A:   Post Drano ingestion Exp Lap SBR Open abd wound P:   Per GI and trauma  HEMATOLOGIC A:   Blood loss anemia P:  Transfusion per protocol  INFECTIOUS A:   Open and wound P:   Abx per  trauma  ENDOCRINE CBG (last 3)   Recent Labs  02/19/16 2343 02/20/16 0321 02/20/16 0802  GLUCAP 164* 154* 172*     A:   Hyperglcemia P:   SSI per trauma   NEUROLOGIC A:   Follows commands Suicide ideation P:   RASS goal: -1 Sedate for tube tolerance and pain Will need Psych consult when better    FAMILY  - Updates:   - Inter-disciplinary family meet or Palliative Care meeting due by:  day 7    Steve Trisa Cranor ACNP [facility] Pager [phone] till 3 pm If no answer page [phone] 02/20/2016, 10:05 AM

## 2016-02-20 NOTE — Op Note (Signed)
NAMLesle Chris:  Affinito, Maiya                ACCOUNT NO.:  [id number]  MEDICAL RECORD NO.:  [id number]  LOCATION:  [id number]                        FACILITY:  [facility]  PHYSICIAN:  Doralee AlbinoMichael H. Carola FrostHandy, M.D. DATE OF BIRTH:  September 10, 1981  DATE OF PROCEDURE:  02/16/2016 DATE OF DISCHARGE:                              OPERATIVE REPORT   PREOPERATIVE DIAGNOSIS:  Multiple pelvic fractures with instability.  POSTOPERATIVE DIAGNOSES:  Multiple pelvic fractures with instability.  PROCEDURE:  Right and left sacroiliac screw fixation at S1 and S2.  SURGEON:  Doralee AlbinoMichael H. Carola FrostHandy, M.D.  ASSISTANT:  None.  ANESTHESIA:  General.  COMPLICATIONS:  None.  DISPOSITION:  To ICU.  CONDITION:  Stable.  BRIEF SUMMARY AND INDICATION FOR PROCEDURE:  The patient is a 35 year old female with a suicidal jumper's type fracture of the pelvis including anterior ring disruption, transforaminal right and left-sided posterior pelvic ring fractures, and a transverse sacral fracture.  The patient has undergone multiple procedures for her abdomen and has been in tenuous condition in the ICU secondary to ingestion of multiple substances prior to her jump.  I have been following with the General Surgery and Trauma Service who recommended posterior internal fixation at this time deeming her to be stable for that procedure but not enough to convert the anterior fixator to a plate.  I discussed with the patient's sister the risks and benefits of the procedure including the potential for nerve injury, vessel injury, failure to stabilize the pelvis adequately, and need for further surgery among others.  We specifically discussed footdrop.  She did wish to proceed.  BRIEF SUMMARY OF PROCEDURE:  The patient remained in the operating room from the procedure performed by Dr. Lindie SpruceWyatt and Janee Mornhompson for exploration of the abdomen and VAC re-application.  Her pelvic fixator sites were addressed with chlorhexidine scrub, irrigation, and then fresh  Kerlix dressing to help contain some of the edema that was perfused enough to potentially run into the operative field during the procedure from third spacing.  The correct starting point was identified for the S1 and S2 screw placements using a perfect lateral.  It was much more difficult because of the patient's large body habitus to accommodate passage of an appropriate length pin as well as obtain simultaneous visualization. Using a combination of inlet and outlet views, we found placement of the pins in both the S1 and S2 vertebral bodies advancing them across the SI joint on the right across the fracture site into the vertebral bodies, and then across the left-sided fracture site and then across the left SI joint into the far ilium.  A good purchase was obtained with both screws.  Final x-rays showed appropriate placement.  The patient was then taken to the ICU in hemodynamically stable condition after thorough irrigation and layered closure.  It should be noted that some of the serous fluid did in fact flow onto the wound immediately following suture closure.  PROGNOSIS:  Ms. [name] remains in serious condition with high risk of complications related to multiple substance ingestion in addition to her pelvic ring fractures and other internal injuries.  We do anticipate conversion to a plate if she can stabilize further in the  next 10 days but may need to treat definitively in the fixator if we are unable to change over during this time period.  We will conduct postoperative examination, but at this time, I do not anticipate any neurologic compromise given her adequate visibility and fluoroscopic views confirming pin placement.  She will be bed to chair and again this will be difficult with the fixator given her large body habitus and potential for impingement on the clamps anteriorly.  She remained on the General Surgery and Trauma Service.     Doralee Albino. [name],  M.D.     MHH/MEDQ  D:  02/19/2016  T:  02/20/2016  Job:  [id number]

## 2016-02-21 ENCOUNTER — Inpatient Hospital Stay (HOSPITAL_COMMUNITY): Payer: Medicaid Other

## 2016-02-21 LAB — GLUCOSE, CAPILLARY
Glucose-Capillary: 135 mg/dL — ABNORMAL HIGH (ref 65–99)
Glucose-Capillary: 135 mg/dL — ABNORMAL HIGH (ref 65–99)
Glucose-Capillary: 139 mg/dL — ABNORMAL HIGH (ref 65–99)
Glucose-Capillary: 131 mg/dL — ABNORMAL HIGH (ref 65–99)
Glucose-Capillary: 124 mg/dL — ABNORMAL HIGH (ref 65–99)
Glucose-Capillary: 118 mg/dL — ABNORMAL HIGH (ref 65–99)

## 2016-02-21 LAB — BASIC METABOLIC PANEL
ANION GAP: 13 (ref 5–15)
BUN: 79 mg/dL — AB (ref 6–20)
CHLORIDE: 118 mmol/L — AB (ref 101–111)
CO2: 30 mmol/L (ref 22–32)
Calcium: 8.3 mg/dL — ABNORMAL LOW (ref 8.9–10.3)
Creatinine, Ser: 1.64 mg/dL — ABNORMAL HIGH (ref 0.44–1.00)
GFR, EST AFRICAN AMERICAN: 47 mL/min — AB (ref >60)
GFR, EST NON AFRICAN AMERICAN: 41 mL/min — AB (ref >60)
Glucose, Bld: 165 mg/dL — ABNORMAL HIGH (ref 65–99)
POTASSIUM: 3.4 mmol/L — AB (ref 3.5–5.1)
SODIUM: 161 mmol/L — AB (ref 135–145)

## 2016-02-21 LAB — CBC
HEMATOCRIT: 24.5 % — AB (ref 36.0–46.0)
Hemoglobin: 7.7 g/dL — ABNORMAL LOW (ref 12.0–15.0)
MCH: 29.2 pg (ref 26.0–34.0)
MCHC: 31.4 g/dL (ref 30.0–36.0)
MCV: 92.8 fL (ref 78.0–100.0)
Platelets: 222 10*3/uL (ref 150–400)
RBC: 2.64 MIL/uL — AB (ref 3.87–5.11)
RDW: 20.6 % — AB (ref 11.5–15.5)
WBC: 35.5 10*3/uL — AB (ref 4.0–10.5)

## 2016-02-21 MED ORDER — FREE WATER
400.0000 mL | Freq: Four times a day (QID) | Status: DC
  Administered 2016-02-21 – 2016-02-22 (×5): 400 mL

## 2016-02-21 NOTE — Progress Notes (Signed)
Patient ID: [name] Bell, female   DOB: [birth year], 35 y.o.   MRN: [id number]   LOS: 12 days   Subjective: Awake on vent   Objective: Vital signs in last 24 hours: Temp:  [99.1 F (37.3 C)-100.6 F (38.1 C)] 99.7 F (37.6 C) (04/15 0400) Pulse Rate:  [82-103] 88 (04/15 0600) Resp:  [19-24] 21 (04/15 0600) BP: (100-122)/(49-73) 116/62 mmHg (04/15 0600) SpO2:  [92 %-96 %] 96 % (04/15 0742) FiO2 (%):  [40 %] 40 % (04/15 0742) Weight:  [134.7 kg (296 lb 15.4 oz)] 134.7 kg (296 lb 15.4 oz) (04/15 0400) Last BM Date:  (pta)   VENT: PRVC/40%/8PEEP/RR20/Vt45990ml   UOP: 4075ml/h NET: +14633ml/24h TOTAL: +3576021ml/admission   CT No air leak 7360ml/24h   Laboratory CBC  Recent Labs  02/20/16 0530 02/21/16 0500  WBC 35.9* 35.5*  HGB 8.2* 7.7*  HCT 26.1* 24.5*  PLT 179 222   BMET  Recent Labs  02/20/16 0530 02/21/16 0500  NA 159* 161*  K 3.2* 3.4*  CL 116* 118*  CO2 30 30  GLUCOSE 171* 165*  BUN 56* 79*  CREATININE 1.05* 1.64*  CALCIUM 8.1* 8.3*   CBG (last 3)   Recent Labs  02/20/16 1945 02/20/16 2313 02/21/16 0328  GLUCAP 136* 121* 135*    Radiology CXR: Diffuse right infiltrate continues to worsen, no PTX (official read pending)   Physical Exam General appearance: alert and no distress Resp: clear to auscultation bilaterally Cardio: regular rate and rhythm GI: ABRA in place   Assessment/Plan: Jump from hotel/multiple toxic ingestions S/P ex lap, hepatorraphy, SBR, cholecystectomy, preperitoneal pelvic packing [name] 4/3  Ex lap for acidosis/bleeding 4/4 Thompson Ex lap for bleeding 4/5 [name] Possible celiac artery intimal injury - bowel viable on explorations S/P ex fix pelvis [name] 4/3 S/P ex lap 4/7 Thompson S/P ex lap 4/10 Thompson S/P sacral screws 4/10 [name] S/P application ABRA 4/12 [name] -- Likely will need Versed for next change Resp failure - CCM managing vent over the weekend Mult B rib FX/R HPTX, B pulm contisons - D/C  CT Multiple toxic ingestions Mult L wrist FXs - splint for now Pelvic FX - S/P ex fix and angioembolization, S/P sacral screw by Dr. Carola FrostHandy. Plan anterior plating next week. Hepatic failure - hemorrhagic shock plus ingestion of toxins, LFTs coming down AKI - Increase IVF ABL anemia - Improved Hyperglycemia - SSI ID - afeb, WBC elevated and stable FEN - Increase free water for hypernatremia VTE - SCD's, Lovenox Dispo - ICU    Freeman CaldronMichael J. Karsen Nakanishi, PA-C Pager: [phone] General Trauma PA Pager: [phone]  02/21/2016

## 2016-02-21 NOTE — Progress Notes (Signed)
PCCM PROGRESS NOTE  Subjective: Remains on full vent support.  Vital signs: BP 104/58 mmHg  Pulse 96  Temp(Src) 100.9 F (38.3 C) (Axillary)  Resp 20  Ht  (1.803 m)  Wt 296 lb 15.4 oz (134.7 kg)  BMI 41.44 kg/m2  SpO2 96%  LMP  (LMP Unknown)  Intake/output: I/O last 3 completed shifts: In: 4387.5 [I.V.:2597.5; NG/GT:1540; IV Piggyback:250] Out: 4275 [Urine:3605; Drains:500; Chest Tube:170]  Vent settings: Vent Mode:  [-] PRVC FiO2 (%):  [40 %] 40 % Set Rate:  [20 bmp] 20 bmp Vt Set:  [490 mL] 490 mL PEEP:  [8 cmH20] 8 cmH20 Plateau Pressure:  [24 cmH20-26 cmH20] 25 cmH20  General: sedated Neuro: RASS -3 HEENT: ETT in place Cardiac: regular Chest: scattered rhonchi Abd: wound closure in place Ext: 2+ edema Skin: no rashes   CBC Recent Labs     02/19/16  1515  02/20/16  0530  02/21/16  0500  WBC  35.8*  35.9*  35.5*  HGB  7.8*  8.2*  7.7*  HCT  24.7*  26.1*  24.5*  PLT  149*  179  222    Coag's No results for input(s): APTT, INR in the last 72 hours.  BMET Recent Labs     02/19/16  0530  02/20/16  0530  02/21/16  0500  NA  154*  159*  161*  K  3.2*  3.2*  3.4*  CL  114*  116*  118*  CO2  BUN  46*  56*  79*  CREATININE  1.00  1.05*  1.64*  GLUCOSE  412*  171*  165*    Electrolytes Recent Labs     02/19/16  0530  02/20/16  0530  02/21/16  0500  CALCIUM  7.1*  8.1*  8.3*    Sepsis Markers No results for input(s): PROCALCITON, O2SATVEN in the last 72 hours.  Invalid input(s): LACTICACIDVEN  ABG Recent Labs     02/21/16  0310  PHART  7.448  PCO2ART  43.9  PO2ART  66.5*    Liver Enzymes No results for input(s): AST, ALT, ALKPHOS, BILITOT, ALBUMIN in the last 72 hours.  Cardiac Enzymes No results for input(s): TROPONINI, PROBNP in the last 72 hours.  Glucose Recent Labs     02/20/16  1139  02/20/16  1524  02/20/16  1945  02/20/16  2313  02/21/16  0328  02/21/16  0739  GLUCAP  148*  146*  136*  121*   135*  135*    Imaging Dg Chest Port 1 View  02/21/2016  CLINICAL DATA:  35 year old female with a history of traumatic hemopneumothorax. EXAM: PORTABLE CHEST 1 VIEW COMPARISON:  02/10/2016, 02/19/2016, prior chest CT 02/09/2016 FINDINGS: Cardiomediastinal silhouette is unchanged in size and contour. Right heart border partially obscured by overlying lung and pleural disease. Dense opacification of the right pleural parenchymal interface persists, with hazy opacity overlying the lung. Right hemidiaphragm not well visualized. Endotracheal tube unchanged, terminating suitably above the carina. Right thoracostomy tube remains terminating at the apex. Interval removal of the second right-sided thoracostomy tube. Unchanged right subclavian central venous catheter, terminating at the superior cavoatrial junction. Multiple minimally displaced right-sided rib fractures Left lung relatively well aerated, with retrocardiac opacification. IMPRESSION: Interval removal of 1 of 2 right-sided thoracostomy tubes. Persisting right-sided pleural fluid, with underlying airspace opacity, potentially consolidation or contusion. Unchanged endotracheal tube. Aeration of left lung relatively maintained, with retrocardiac opacity, potentially atelectasis, small pleural  fluid, or consolidation. Multiple minimally displaced right-sided rib fractures. Signed, Yvone NeuJaime S. Loreta AveWagner, DO [facility] Electronically Signed   By: Gilmer MorJaime  Wagner D.O.   On: 02/21/2016 09:23   Dg Chest Port 1 View  02/20/2016  CLINICAL DATA:  Respiratory failure. EXAM: PORTABLE CHEST 1 VIEW COMPARISON:  02/19/2016 . FINDINGS: Interim removal of NG tube. Endotracheal tube, right PICC line, right chest tubes in stable position . Diffuse right lung infiltrate and right pleural effusion again noted . Left lower lobe atelectasis and/or infiltrate again noted . No pneumothorax. Multiple right rib fractures again  noted. IMPRESSION: 1. Lines and tubes including 2 right chest tube in stable position. No pneumothorax. 2. Diffuse right lung infiltrate and right pleural effusion again noted. Left lower lobe atelectasis and/or infiltrate again noted No interim change. 3.  Multiple right rib fractures again noted.  No pneumothorax. Electronically Signed   By: Maisie Fushomas  Register   On: 02/20/2016 08:01    Assessment/plan:  Acute hypoxic respiratory failure. - full vent support until more stable - f/u CXR  Multiple trauma with Rb fx. - per primary team  Hypernatremia. - fluids adjusted by trauma team  S/p laparotomy. - post op care per surgery   Defer further management to Trauma team.  Please call if additional help needed from PCCM.  Coralyn HellingVineet Lelia Jons, MD [facility] 02/21/2016, 10:44 AM Pager:  [phone] After 3pm call: [phone]

## 2016-02-22 ENCOUNTER — Inpatient Hospital Stay (HOSPITAL_COMMUNITY): Payer: Medicaid Other

## 2016-02-22 LAB — URINALYSIS, ROUTINE W REFLEX MICROSCOPIC
Glucose, UA: NEGATIVE mg/dL
Ketones, ur: 15 mg/dL — AB
Nitrite: NEGATIVE
PROTEIN: 100 mg/dL — AB
SPECIFIC GRAVITY, URINE: 1.017 (ref 1.005–1.030)
pH: 5.5 (ref 5.0–8.0)

## 2016-02-22 LAB — BASIC METABOLIC PANEL
ANION GAP: 12 (ref 5–15)
BUN: 106 mg/dL — AB (ref 6–20)
CALCIUM: 8.4 mg/dL — AB (ref 8.9–10.3)
CO2: 28 mmol/L (ref 22–32)
Chloride: 116 mmol/L — ABNORMAL HIGH (ref 101–111)
Creatinine, Ser: 2.24 mg/dL — ABNORMAL HIGH (ref 0.44–1.00)
GFR calc Af Amer: 32 mL/min — ABNORMAL LOW (ref >60)
GFR calc non Af Amer: 28 mL/min — ABNORMAL LOW (ref >60)
GLUCOSE: 151 mg/dL — AB (ref 65–99)
POTASSIUM: 3.8 mmol/L (ref 3.5–5.1)
Sodium: 156 mmol/L — ABNORMAL HIGH (ref 135–145)

## 2016-02-22 LAB — CBC
HEMATOCRIT: 23.8 % — AB (ref 36.0–46.0)
Hemoglobin: 7.5 g/dL — ABNORMAL LOW (ref 12.0–15.0)
MCH: 29.4 pg (ref 26.0–34.0)
MCHC: 31.5 g/dL (ref 30.0–36.0)
MCV: 93.3 fL (ref 78.0–100.0)
Platelets: 249 10*3/uL (ref 150–400)
RBC: 2.55 MIL/uL — ABNORMAL LOW (ref 3.87–5.11)
RDW: 21.4 % — AB (ref 11.5–15.5)
WBC: 32.2 10*3/uL — AB (ref 4.0–10.5)

## 2016-02-22 LAB — GLUCOSE, CAPILLARY
GLUCOSE-CAPILLARY: 143 mg/dL — AB (ref 65–99)
Glucose-Capillary: 116 mg/dL — ABNORMAL HIGH (ref 65–99)
Glucose-Capillary: 121 mg/dL — ABNORMAL HIGH (ref 65–99)
Glucose-Capillary: 126 mg/dL — ABNORMAL HIGH (ref 65–99)
Glucose-Capillary: 121 mg/dL — ABNORMAL HIGH (ref 65–99)

## 2016-02-22 LAB — URINE MICROSCOPIC-ADD ON

## 2016-02-22 MED ORDER — DIAZEPAM 1 MG/ML PO SOLN
5.0000 mg | Freq: Three times a day (TID) | ORAL | Status: DC
  Administered 2016-02-22 – 2016-03-03 (×28): 5 mg via ORAL
  Filled 2016-02-22 (×28): qty 5

## 2016-02-22 MED ORDER — CLONAZEPAM 0.1 MG/ML ORAL SUSPENSION
1.0000 mg | Freq: Three times a day (TID) | ORAL | Status: DC
  Filled 2016-02-22: qty 10

## 2016-02-22 MED ORDER — ESCITALOPRAM OXALATE 10 MG PO TABS
10.0000 mg | ORAL_TABLET | Freq: Every day | ORAL | Status: DC
  Administered 2016-02-22 – 2016-03-24 (×31): 10 mg
  Filled 2016-02-22 (×32): qty 1

## 2016-02-22 MED ORDER — CLONAZEPAM 0.1 MG/ML ORAL SUSPENSION: 1.0000 mg | Freq: Three times a day (TID) | ORAL | Status: DC

## 2016-02-22 MED ORDER — FUROSEMIDE 10 MG/ML IJ SOLN
80.0000 mg | Freq: Two times a day (BID) | INTRAMUSCULAR | Status: DC
  Administered 2016-02-22 – 2016-03-03 (×19): 80 mg via INTRAVENOUS
  Filled 2016-02-22 (×20): qty 8

## 2016-02-22 MED ORDER — CLONAZEPAM 1 MG PO TABS: 1.0000 mg | ORAL_TABLET | Freq: Three times a day (TID) | ORAL | Status: DC

## 2016-02-22 MED ORDER — CLONAZEPAM 0.5 MG PO TBDP: 1.0000 mg | ORAL_TABLET | Freq: Three times a day (TID) | ORAL | Status: DC

## 2016-02-22 MED ORDER — FREE WATER
400.0000 mL | Status: DC
  Administered 2016-02-22 – 2016-03-11 (×101): 400 mL
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
## 2016-02-23 ENCOUNTER — Inpatient Hospital Stay (HOSPITAL_COMMUNITY): Payer: Medicaid Other

## 2016-02-23 ENCOUNTER — Other Ambulatory Visit: Payer: Self-pay

## 2016-02-23 DIAGNOSIS — J90 Pleural effusion, not elsewhere classified: Secondary | ICD-10-CM

## 2016-02-23 DIAGNOSIS — I472 Ventricular tachycardia: Secondary | ICD-10-CM

## 2016-02-23 LAB — BLOOD GAS, ARTERIAL
Acid-Base Excess: 5.8 mmol/L — ABNORMAL HIGH (ref 0.0–2.0)
Bicarbonate: 29.9 meq/L — ABNORMAL HIGH (ref 20.0–24.0)
Drawn by: 41977
FIO2: 0.4
MECHVT: 490 mL
O2 Saturation: 93.6 %
PEEP: 8 cmH2O
Patient temperature: 98.6
RATE: 20 {breaths}/min
TCO2: 31.2 mmol/L (ref 0–100)
pCO2 arterial: 43.9 mmHg (ref 35.0–45.0)
pH, Arterial: 7.448 (ref 7.350–7.450)
pO2, Arterial: 66.5 mmHg — ABNORMAL LOW (ref 80.0–100.0)

## 2016-02-23 LAB — BASIC METABOLIC PANEL
Anion gap: 12 (ref 5–15)
Anion gap: 13 (ref 5–15)
BUN: 132 mg/dL — AB (ref 6–20)
BUN: 150 mg/dL — AB (ref 6–20)
CALCIUM: 8.9 mg/dL (ref 8.9–10.3)
CHLORIDE: 114 mmol/L — AB (ref 101–111)
CO2: 28 mmol/L (ref 22–32)
CO2: 27 mmol/L (ref 22–32)
Calcium: 9 mg/dL (ref 8.9–10.3)
Chloride: 116 mmol/L — ABNORMAL HIGH (ref 101–111)
Creatinine, Ser: 2.72 mg/dL — ABNORMAL HIGH (ref 0.44–1.00)
Creatinine, Ser: 2.8 mg/dL — ABNORMAL HIGH (ref 0.44–1.00)
GFR calc Af Amer: 25 mL/min — ABNORMAL LOW (ref >60)
GFR, EST AFRICAN AMERICAN: 25 mL/min — AB (ref >60)
GFR, EST NON AFRICAN AMERICAN: 22 mL/min — AB (ref >60)
GFR, EST NON AFRICAN AMERICAN: 21 mL/min — AB (ref >60)
Glucose, Bld: 123 mg/dL — ABNORMAL HIGH (ref 65–99)
Glucose, Bld: 148 mg/dL — ABNORMAL HIGH (ref 65–99)
POTASSIUM: 3.7 mmol/L (ref 3.5–5.1)
POTASSIUM: 3.5 mmol/L (ref 3.5–5.1)
SODIUM: 156 mmol/L — AB (ref 135–145)
SODIUM: 154 mmol/L — AB (ref 135–145)

## 2016-02-23 LAB — APTT: aPTT: 37 seconds (ref 24–37)

## 2016-02-23 LAB — CBC
HCT: 22.5 % — ABNORMAL LOW (ref 36.0–46.0)
Hemoglobin: 7.3 g/dL — ABNORMAL LOW (ref 12.0–15.0)
MCH: 30.4 pg (ref 26.0–34.0)
MCHC: 32.4 g/dL (ref 30.0–36.0)
MCV: 93.8 fL (ref 78.0–100.0)
PLATELETS: 265 10*3/uL (ref 150–400)
RBC: 2.4 MIL/uL — AB (ref 3.87–5.11)
RDW: 21.9 % — AB (ref 11.5–15.5)
WBC: 28.5 10*3/uL — AB (ref 4.0–10.5)

## 2016-02-23 LAB — GLUCOSE, CAPILLARY
GLUCOSE-CAPILLARY: 102 mg/dL — AB (ref 65–99)
GLUCOSE-CAPILLARY: 122 mg/dL — AB (ref 65–99)
GLUCOSE-CAPILLARY: 127 mg/dL — AB (ref 65–99)
GLUCOSE-CAPILLARY: 132 mg/dL — AB (ref 65–99)
Glucose-Capillary: 107 mg/dL — ABNORMAL HIGH (ref 65–99)
Glucose-Capillary: 122 mg/dL — ABNORMAL HIGH (ref 65–99)
Glucose-Capillary: 131 mg/dL — ABNORMAL HIGH (ref 65–99)

## 2016-02-23 LAB — MAGNESIUM: MAGNESIUM: 2.1 mg/dL (ref 1.7–2.4)

## 2016-02-23 LAB — CK: CK TOTAL: 866 U/L — AB (ref 38–234)

## 2016-02-23 LAB — PHOSPHORUS: PHOSPHORUS: 5.6 mg/dL — AB (ref 2.5–4.6)

## 2016-02-23 MED ORDER — MIDAZOLAM HCL 2 MG/2ML IJ SOLN
INTRAMUSCULAR | Status: AC
  Filled 2016-02-23: qty 2

## 2016-02-23 MED ORDER — BISACODYL 10 MG RE SUPP
10.0000 mg | Freq: Every day | RECTAL | Status: DC
  Administered 2016-02-23 – 2016-03-09 (×6): 10 mg via RECTAL
  Filled 2016-02-23 (×8): qty 1

## 2016-02-23 MED ORDER — MIDAZOLAM HCL 2 MG/2ML IJ SOLN
2.0000 mg | Freq: Once | INTRAMUSCULAR | Status: AC
  Administered 2016-02-23: 2 mg via INTRAVENOUS
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
## 2016-02-23 NOTE — Progress Notes (Signed)
ABRA adjustment and NPWD change at the bedside.  Wound started at 8.0 cm maximal width.  Post procedure maximal width 4.5cm.  Marta LamasJames O. Gae BonWyatt, III, MD, FACS [phone] Trauma Surgeon

## 2016-02-23 NOTE — Progress Notes (Signed)
Site of former CT under RT breast bleeding heavily when breast lifted. PA Jeffery notified, will allow to drain then get CXR per oder.

## 2016-02-23 NOTE — Consult Note (Addendum)
Cardiology Consult    Patient ID: [name] Bell MRN: [id number], DOB/AGE: 1981/10/04   Admit date: 02/09/2016 Date of Consult: 02/23/2016  Primary Physician: No primary care provider on file. Primary Cardiologist: None Requesting Provider: Georganna Skeans, MD, MPH  Patient Profile    35F s/p suicide attempt via toxic injestion and jump from 5th floor hotel room who has sustained substantial trauma who has run of VT.   Past Medical History   History reviewed. No pertinent past medical history.  Past Surgical History  Procedure Laterality Date  . Esophagogastroduodenoscopy N/A 02/10/2016    Procedure: ESOPHAGOGASTRODUODENOSCOPY (EGD);  Surgeon: Doran Stabler, MD;  Location: [facility];  Service: Endoscopy;  Laterality: N/A;  . Laparotomy N/A 02/10/2016    Procedure: EXPLORATORY LAPAROTOMY, removal of packs,  cauterization of liver, repacking of liver, and open abdomen vac application;  Surgeon: Georganna Skeans, MD;  Location: [place];  Service: General;  Laterality: N/A;  . Laparotomy N/A 02/11/2016    Procedure: EXPLORATORY LAPAROTOMY VAC CHANGE ;  Surgeon: Judeth Horn, MD;  Location: [place];  Service: General;  Laterality: N/A;  . Chest tube insertion Right 02/11/2016    Procedure: CHEST TUBE INSERTION;  Surgeon: Judeth Horn, MD;  Location: [place];  Service: General;  Laterality: Right;  . Laparotomy N/A 02/09/2016    Procedure: EXPLORATORY LAPAROTOMY;  Surgeon: Judeth Horn, MD;  Location: [place];  Service: General;  Laterality: N/A;  . Cholecystectomy  02/09/2016    Procedure: CHOLECYSTECTOMY;  Surgeon: Judeth Horn, MD;  Location: [place];  Service: General;;  . Bowel resection  02/09/2016    Procedure: SMALL BOWEL RESECTION, MESENTERIC REPAIR;  Surgeon: Judeth Horn, MD;  Location: [place];  Service: General;;  . Application of wound vac  02/09/2016    Procedure: APPLICATION OF WOUND VAC;  Surgeon: Judeth Horn, MD;  Location: [place];  Service: General;;  . Laceration repair  02/09/2016    Procedure:  REPAIR LIVER LACERATION;  Surgeon: Judeth Horn, MD;  Location: [place];  Service: General;;  . External fixation pelvis  02/09/2016    Procedure: EXTERNAL FIXATION PELVIS;  Surgeon: Altamese Worthington, MD;  Location: [place];  Service: Orthopedics;;  . Laparotomy N/A 02/13/2016    Procedure: EXPLORATORY LAPAROTOMY, REMOVAL OF PACKS, ABDOMINAL VAC DRESSING CHANGE;  Surgeon: Georganna Skeans, MD;  Location: [place];  Service: General;  Laterality: N/A;  . Laparotomy N/A 02/16/2016    Procedure: EXPLORATORY LAPAROTOMY, ABDOMINAL [place] OUT;  Surgeon: Georganna Skeans, MD;  Location: [place];  Service: General;  Laterality: N/A;  . Application of wound vac N/A 02/16/2016    Procedure: RE-APPLICATION OF WOUND VAC;  Surgeon: Georganna Skeans, MD;  Location: [place];  Service: General;  Laterality: N/A;  . Sacro-iliac pinning Right 02/16/2016    Procedure: [place];  Surgeon: Altamese Arthur, MD;  Location: [place];  Service: Orthopedics;  Laterality: Right;  . Laparotomy N/A 02/18/2016    Procedure: EXPLORATORY LAPAROTOMY, PLACEMENT OF ABRA ABDOMINAL WALL CLOSURE SET;  Surgeon: Judeth Horn, MD;  Location: [place];  Service: General;  Laterality: N/A;     Allergies  No Known Allergies  History of Present Illness    35F s/p suicide attempt via toxic injestion and jump from 5th floor hotel room (4/3) who has sustained substantial trauma including severe blunt injury to her thorax with fractures of ribs 2 through 12 on the right side. She has a small to moderate right pleural effusion and probably pulmonary parenchymal contusion. Course also  notable for acute renal failure with profound uremia, volume overload with profound hypernatremia. She is s/p ex lap and has an open abdomen. She has remained intubated and is on a significant amount of narcotics for comfort but she is interacting intermittently.    Today she had irregular but generally MMVT with max rate of about 160-170bpm for about 35 seconds. There were intermittent  capture complexes. She had a duonebs treatment about 30 minutes prior. She was resting comfortably prior the the episode. VT terminated spontaneously. ECG after the event demonstrated NSR with normal intervals. No STTWC c/w ischemia.   The patient cannot communicate due to being intubated. Her sister denied being aware of any history of chest pain or pressure, arrhythmia, or syncope. She denied a FH of CV disease, including MI, HF, arrhythmia, or sudden death.   Inpatient Medications    . antiseptic oral rinse  7 mL Mouth Rinse 10 times per day  . bisacodyl  10 mg Rectal Daily  . chlorhexidine gluconate (SAGE KIT)  15 mL Mouth Rinse BID  . diazepam  5 mg Oral 3 times per day  . enoxaparin (LOVENOX) injection  40 mg Subcutaneous BID  . escitalopram  10 mg Per Tube Daily  . feeding supplement (PIVOT 1.5 CAL)  1,000 mL Per Tube Q24H  . feeding supplement (PRO-STAT SUGAR FREE 64)  60 mL Per Tube TID  . free water  400 mL Per Tube Q4H  . furosemide  80 mg Intravenous Q12H  . insulin aspart  0-20 Units Subcutaneous 6 times per day  . ipratropium-albuterol  3 mL Nebulization Q6H  . lidocaine (PF)  30 mL Intradermal Once  . pantoprazole  40 mg Oral Daily   Or  . pantoprazole (PROTONIX) IV  40 mg Intravenous Daily  . sodium chloride flush  10-40 mL Intracatheter Q12H    Family History    History reviewed. No pertinent family history.  Social History    Social History   Social History  . Marital Status: Unknown    Spouse Name: N/A  . Number of Children: N/A  . Years of Education: N/A   Occupational History  . Not on file.   Social History Main Topics  . Smoking status: Unknown If Ever Smoked  . Smokeless tobacco: Not on file  . Alcohol Use: Not on file     Comment: unknown  . Drug Use: Not on file  . Sexual Activity: Not on file   Other Topics Concern  . Not on file   Social History Narrative     Review of Systems    Unable to perform  Physical Exam    Blood  pressure 99/54, pulse 82, temperature 98.4 F (36.9 C), temperature source Axillary, resp. rate 20, height _0  (1.803 m), weight 137.6 kg (303 lb 5.7 oz), SpO2 100 %.  General: intubated, sedated, anasarcic, morbidly obese Psych: sedated Neuro: sedated HEENT: ETT  Neck: supple Lungs:  Resp regular and unlabored, CTA. Heart: RRR no s3, s4, or murmurs. Abdomen: Obese, covered in wrap Extremities: Warm, diffusely edematous. 2+ radial pulses.  Labs    Troponin (Point of Care Test) No results for input(s): TROPIPOC in the last 72 hours.  Recent Labs  02/23/16 1100  CKTOTAL 866*   Lab Results  Component Value Date   WBC 28.5* 02/23/2016   HGB 7.3* 02/23/2016   HCT 22.5* 02/23/2016   MCV 93.8 02/23/2016   PLT 265 02/23/2016    Recent Labs Lab 02/23/16 0400  NA 156*  K 3.7  CL 116*  CO2 28  BUN 132*  CREATININE 2.72*  CALCIUM 8.9  GLUCOSE 123*   Lab Results  Component Value Date   TRIG 91 02/09/2016   Lab Results  Component Value Date   DDIMER 14.61* 02/13/2016     Radiology Studies    Dg Si Joints  02/16/2016  CLINICAL DATA:  Sacroiliac joint pinning EXAM: DG C-ARM 61-120 MIN; BILATERAL SACROILIAC JOINTS - 3+ VIEW COMPARISON:  02/09/2016 CT abdomen/ pelvis and pelvic radiograph. FINDINGS: Fluoroscopy time 2 minutes 35 seconds. Seven spot fluoroscopic nondiagnostic intraoperative radiographs of the sacroiliac joints were provided, which demonstrate placement of 2 horizontal pins traversing the bilateral sacroiliac joints. IMPRESSION: Intraoperative fluoroscopic guidance for pin placement traversing the sacroiliac joints. Electronically Signed   By: Ilona Sorrel M.D.   On: 02/16/2016 13:32   Dg Wrist Complete Left  02/09/2016  CLINICAL DATA:  Fall. EXAM: LEFT WRIST - COMPLETE 3+ VIEW COMPARISON:  None. FINDINGS: Exam detail is diminished due to overlying support material. There is a fracture deformity which extends through the base second metacarpal bone. IMPRESSION:  1. There is a fracture involving the base of the second metacarpal bone. Recommend repeat imaging following removal overlying tubes and lines. Electronically Signed   By: Kerby Moors M.D.   On: 02/09/2016 18:08   Ct Head Wo Contrast  02/10/2016  CLINICAL DATA:  Multiple injuries after jumping from a fifth floor balcony on 02/09/2016. Initial encounter. EXAM: CT HEAD WITHOUT CONTRAST CT CERVICAL SPINE WITHOUT CONTRAST TECHNIQUE: Multidetector CT imaging of the head and cervical spine was performed following the standard protocol without intravenous contrast. Multiplanar CT image reconstructions of the cervical spine were also generated. COMPARISON:  None. FINDINGS: CT HEAD FINDINGS Soft tissues of the scalp appear somewhat swollen. The calvarium is intact. The brain appears normal without hemorrhage, infarct, mass lesion, mass effect, midline shift or abnormal extra-axial fluid collection. No hydrocephalus or pneumocephalus. Short air-fluid levels are seen in the maxillary sinuses. Air-fluid levels are also identified in the sphenoid sinuses and there is scattered ethmoid air cell disease and mucosal thickening in the frontal sinuses. Mastoid air cells are clear. Endotracheal tube and NG tube are in place. CT CERVICAL SPINE FINDINGS No fracture or malalignment of the cervical spine is identified. Note is made that the patient's OG tube is looped in the pharynx and mouth. Paraspinous structures are unremarkable. IMPRESSION: No acute intracranial abnormality or acute abnormality of the cervical spine. Sinus disease is likely related to intubation and OG tube placement. The patient's OG tube is looped in the pharynx and mouth. Electronically Signed   By: Inge Rise M.D.   On: 02/10/2016 14:54   Ct Chest W Contrast  02/09/2016  CLINICAL DATA:  Level 1 trauma. [place] off a fifth floor balcony. Hypotension and abdominal pain. Intubated in the emergency department and taken to the operating room for exploratory  laparotomy. Initial encounter. EXAM: CT CHEST, ABDOMEN, AND PELVIS WITH CONTRAST TECHNIQUE: Multidetector CT imaging of the chest, abdomen and pelvis was performed following the standard protocol during bolus administration of intravenous contrast. CONTRAST:  100 mL Isovue-300 COMPARISON:  Chest and pelvis radiographs earlier today FINDINGS: CT CHEST An endotracheal tube is in place and terminates 11.5 cm above the carina. An enteric tube terminates in the stomach. A right jugular catheter is partially visualized terminating near the confluence of the brachiocephalic veins. Hematoma is partially visualized in the right lower neck adjacent to the right thyroid  lobe and anterior to the right common carotid artery. There is also hematoma in the right supraclavicular region and right chest wall. There are fractures of the right second through twelfth ribs, many of which are fractured in a segmental fashion. The most displaced right-sided rib fracture involves the posterior right eleventh rib with 1.6 cm of displacement. There fractures of the left first and second ribs. There are small bilateral pneumothoraces, right larger than left. A chest tube terminates in the right upper hemi thorax and may be within the major fissure. Extensive bilateral lung opacities likely reflect a combination of collapse and contusions, greatest in the lower lobes and right upper lobe. There is a small right hemothorax, and there is also trace left pleural fluid/blood. There is diffuse haziness/hematoma in the mediastinum without definite evidence of thoracic aortic injury. CT ABDOMEN AND PELVIS There is a large liver laceration which involves the right greater than left lobes and extends through the hilum. There is attenuation of intrahepatic portal venous branches. No definite active extravasation is identified in/adjacent to the liver. The spleen is suboptimally evaluated due to motion and streak artifact. There is a small amount of  perisplenic free fluid. There is hyperenhancement of both adrenal glands. A laceration in the medial upper pole of the right kidney measures approximately 4.0 x 3.1 cm without definite active extravasation in this area. There is a small right perinephric hematoma which partially surrounds the main renal artery and vein which are grossly patent. No acute left renal injury is identified. There is retroperitoneal/peripancreatic fluid with poor delineation of the pancreatic head. This also obscures portions of the duodenum. Sequelae of recent laparotomy are identified with a large midline abdominal wound. There is mucosal hyperenhancement involving multiple bowel loops. Sequelae of small bowel resection are identified with an anastomosis in the midline just deep to the open and wound. Moderate volume intraperitoneal free fluid is present. There is intraperitoneal free air related to recent surgery. Sponges are present in the right upper quadrant along the anterior inferior surface of the liver as well as in the left lower quadrant. A Foley catheter is in place and the bladder is decompressed. The abdominal aorta is patent, however there is evidence of focal injury at the celiac origin with a 10 x 3 x 15 mm outpouching at the origin and short segment occlusion of the proximal celiac artery over a length of 8 mm. The remainder of the abdominal aorta appears intact. The SMA, renal arteries, IMA, and iliac arteries are grossly patent. There are transverse process fractures on the right at L1, bilaterally at L2, bilaterally at L3, on the left at L4, and on the left at L5. There are comminuted bilateral sacral fractures. Bilateral external fixation devices have been placed into the iliac bones. There are bilateral superior and inferior pubic rami fractures. There is extraluminal contrast posterior to the left inferior ramus fracture consistent with active extravasation. There is also a 2.5 cm focus of extravasation posterior  to the right hip with large amount of surrounding hematoma in the gluteal region extending into the upper thigh. There is diastasis of the pubic symphysis. A 3.8 cm lucent lesion with well-defined sclerotic margin is present in the right femoral neck, nonspecific but without aggressive features. Hematoma is partially visualized in the subcutaneous tissues of the right flank extending into the lateral right upper thigh. IMPRESSION: 1. 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 2. Multiple pelvic  fractures with active extravasation adjacent to the left inferior pubic ramus fracture and posterior to the right hip. 3. Bilateral hemopneumothoraces, right larger than left. Right chest tube in place. 4. Bilateral pulmonary contusions and collapse. 5. Right second through twelfth and left first and second rib fractures. Multiple right-sided ribs are displaced and fractured segmentally. 6. Large liver laceration. 7. Right renal laceration. 8. Retroperitoneal hematoma which could reflect pancreatic or duodenal injury. 9. Sequelae of exploratory laparotomy with moderate volume intraperitoneal fluid/hematoma. Right upper quadrant and left lower quadrant sponges. 10. Evidence of hypovolemia including shock bowel. 11. Multiple lumbar spine transverse process fractures. Critical Value/emergent results were called by telephone at the time of interpretation on 02/09/2016 at 2:05 pm to Silvestre Gunner, PA, who verbally acknowledged these results. Electronically Signed   By: Logan Bores M.D.   On: 02/09/2016 14:23   Ct Cervical Spine Wo Contrast  02/10/2016  CLINICAL DATA:  Multiple injuries after jumping from a fifth floor balcony on 02/09/2016. Initial encounter. EXAM: CT HEAD WITHOUT CONTRAST CT CERVICAL SPINE WITHOUT CONTRAST TECHNIQUE: Multidetector CT imaging of the head and cervical spine was performed following the standard protocol without intravenous  contrast. Multiplanar CT image reconstructions of the cervical spine were also generated. COMPARISON:  None. FINDINGS: CT HEAD FINDINGS Soft tissues of the scalp appear somewhat swollen. The calvarium is intact. The brain appears normal without hemorrhage, infarct, mass lesion, mass effect, midline shift or abnormal extra-axial fluid collection. No hydrocephalus or pneumocephalus. Short air-fluid levels are seen in the maxillary sinuses. Air-fluid levels are also identified in the sphenoid sinuses and there is scattered ethmoid air cell disease and mucosal thickening in the frontal sinuses. Mastoid air cells are clear. Endotracheal tube and NG tube are in place. CT CERVICAL SPINE FINDINGS No fracture or malalignment of the cervical spine is identified. Note is made that the patient's OG tube is looped in the pharynx and mouth. Paraspinous structures are unremarkable. IMPRESSION: No acute intracranial abnormality or acute abnormality of the cervical spine. Sinus disease is likely related to intubation and OG tube placement. The patient's OG tube is looped in the pharynx and mouth. Electronically Signed   By: Inge Rise M.D.   On: 02/10/2016 14:54   Ct Abdomen Pelvis W Contrast  02/09/2016  CLINICAL DATA:  Level 1 trauma. [place] off a fifth floor balcony. Hypotension and abdominal pain. Intubated in the emergency department and taken to the operating room for exploratory laparotomy. Initial encounter. EXAM: CT CHEST, ABDOMEN, AND PELVIS WITH CONTRAST TECHNIQUE: Multidetector CT imaging of the chest, abdomen and pelvis was performed following the standard protocol during bolus administration of intravenous contrast. CONTRAST:  100 mL Isovue-300 COMPARISON:  Chest and pelvis radiographs earlier today FINDINGS: CT CHEST An endotracheal tube is in place and terminates 11.5 cm above the carina. An enteric tube terminates in the stomach. A right jugular catheter is partially visualized terminating near the confluence  of the brachiocephalic veins. Hematoma is partially visualized in the right lower neck adjacent to the right thyroid lobe and anterior to the right common carotid artery. There is also hematoma in the right supraclavicular region and right chest wall. There are fractures of the right second through twelfth ribs, many of which are fractured in a segmental fashion. The most displaced right-sided rib fracture involves the posterior right eleventh rib with 1.6 cm of displacement. There fractures of the left first and second ribs. There are small bilateral pneumothoraces, right larger than left. A chest tube terminates in  the right upper hemi thorax and may be within the major fissure. Extensive bilateral lung opacities likely reflect a combination of collapse and contusions, greatest in the lower lobes and right upper lobe. There is a small right hemothorax, and there is also trace left pleural fluid/blood. There is diffuse haziness/hematoma in the mediastinum without definite evidence of thoracic aortic injury. CT ABDOMEN AND PELVIS There is a large liver laceration which involves the right greater than left lobes and extends through the hilum. There is attenuation of intrahepatic portal venous branches. No definite active extravasation is identified in/adjacent to the liver. The spleen is suboptimally evaluated due to motion and streak artifact. There is a small amount of perisplenic free fluid. There is hyperenhancement of both adrenal glands. A laceration in the medial upper pole of the right kidney measures approximately 4.0 x 3.1 cm without definite active extravasation in this area. There is a small right perinephric hematoma which partially surrounds the main renal artery and vein which are grossly patent. No acute left renal injury is identified. There is retroperitoneal/peripancreatic fluid with poor delineation of the pancreatic head. This also obscures portions of the duodenum. Sequelae of recent laparotomy  are identified with a large midline abdominal wound. There is mucosal hyperenhancement involving multiple bowel loops. Sequelae of small bowel resection are identified with an anastomosis in the midline just deep to the open and wound. Moderate volume intraperitoneal free fluid is present. There is intraperitoneal free air related to recent surgery. Sponges are present in the right upper quadrant along the anterior inferior surface of the liver as well as in the left lower quadrant. A Foley catheter is in place and the bladder is decompressed. The abdominal aorta is patent, however there is evidence of focal injury at the celiac origin with a 10 x 3 x 15 mm outpouching at the origin and short segment occlusion of the proximal celiac artery over a length of 8 mm. The remainder of the abdominal aorta appears intact. The SMA, renal arteries, IMA, and iliac arteries are grossly patent. There are transverse process fractures on the right at L1, bilaterally at L2, bilaterally at L3, on the left at L4, and on the left at L5. There are comminuted bilateral sacral fractures. Bilateral external fixation devices have been placed into the iliac bones. There are bilateral superior and inferior pubic rami fractures. There is extraluminal contrast posterior to the left inferior ramus fracture consistent with active extravasation. There is also a 2.5 cm focus of extravasation posterior to the right hip with large amount of surrounding hematoma in the gluteal region extending into the upper thigh. There is diastasis of the pubic symphysis. A 3.8 cm lucent lesion with well-defined sclerotic margin is present in the right femoral neck, nonspecific but without aggressive features. Hematoma is partially visualized in the subcutaneous tissues of the right flank extending into the lateral right upper thigh. IMPRESSION: 1. 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 2.  Multiple pelvic fractures with active extravasation adjacent to the left inferior pubic ramus fracture and posterior to the right hip. 3. Bilateral hemopneumothoraces, right larger than left. Right chest tube in place. 4. Bilateral pulmonary contusions and collapse. 5. Right second through twelfth and left first and second rib fractures. Multiple right-sided ribs are displaced and fractured segmentally. 6. Large liver laceration. 7. Right renal laceration. 8. Retroperitoneal hematoma which could reflect pancreatic or duodenal injury. 9. Sequelae of exploratory laparotomy with moderate  volume intraperitoneal fluid/hematoma. Right upper quadrant and left lower quadrant sponges. 10. Evidence of hypovolemia including shock bowel. 11. Multiple lumbar spine transverse process fractures. Critical Value/emergent results were called by telephone at the time of interpretation on 02/09/2016 at 2:05 pm to Silvestre Gunner, PA, who verbally acknowledged these results. Electronically Signed   By: Logan Bores M.D.   On: 02/09/2016 14:23   Ir Angiogram Pelvis Selective Or Supraselective  02/09/2016  CLINICAL DATA:  Comminuted and open pelvic fractures post blunt trauma. underwent pelvic external fixation device placement and intraoperative control of liver laceration. Postop CT demonstrates 2 foci of continued active arterial extravasation proximal to pelvic fractures. Embolization requested. EXAM: IR EMBO ART  VEN HEMORR LYMPH EXTRAV  INC GUIDE ROADMAPPING ANESTHESIA/SEDATION: Patient arrived intubated and under hemodynamic control by the Department of Anesthesia personnel. MEDICATIONS: Lidocaine 1% subcutaneous CONTRAST:  167m OMNIPAQUE IOHEXOL 300 MG/ML  SOLN FLUOROSCOPY TIME:  4.5 minutes, 1403 mGy PROCEDURE: Due to the emergent nature of the situation, usual informed consent was deferred. Due to an indwelling right femoral venous catheter, the left femoral region prepped and draped in usual sterile fashion. Maximal barrier  sterile technique was utilized including caps, mask, sterile gowns, sterile gloves, sterile drape, hand hygiene and skin antiseptic. The left common femoral artery was localized under ultrasound. Patency confirmed and documented. Under real-time ultrasound guidance, the vessel was accessed with a 21-gauge micropuncture needle, exchanged over a 018 guidewire for a transitional dilator, through which a 035 guidewire was advanced. Over this, a 5 [place] sheath was placed, through which a 5 [place] catheter was advanced and used to selectively catheterize the right common iliac artery for selective pelvic arteriography. The C2 catheter was advanced with the aid of the [facility] into the distal internal iliac artery. Confirmatory arteriography was performed. Embolization of internal iliac artery branches performed through the C2 catheter using a slurry of Gel-Foam pledgets in dilute contrast. The C2 catheter was then removed, flushed, and reintroduced into the right common iliac artery for confirmatory pelvic arteriography. A Waltman loop was formed with the C2 catheter, and the left common iliac artery was catheterized antegrade for selective pelvic arteriography. The C2 was advanced into the left internal iliac artery. Confirmatory arteriography was performed. Embolization of left internal iliac artery branches performed through the C2 catheter using a slurry of Gel-Foam pledgets and dilute contrast. The C2 catheter was then removed, flushed, and reintroduced into the left common iliac artery for confirmatory pelvic arteriography. The C2 was removed. A final left femoral arteriogram performed to document sheath placement. The sheath was secured externally with a 0 silk suture and flushed with saline. The patient tolerated the procedure well. COMPLICATIONS: None immediate FINDINGS: Technically successful empiric Gel-Foam embolization of bilateral internal iliac artery territories, without immediate  complication. Due to the patient's coagulopathy and hemodynamic instability, the left femoral arterial catheter was secured in place as art line. IMPRESSION: 1. Technically successful bilateral internal iliac artery Gel-Foam embolization. Electronically Signed   By: DLucrezia EuropeM.D.   On: 02/09/2016 14:18   Ir Angiogram Pelvis Selective Or Supraselective  02/09/2016  CLINICAL DATA:  Comminuted and open pelvic fractures post blunt trauma. underwent pelvic external fixation device placement and intraoperative control of liver laceration. Postop CT demonstrates 2 foci of continued active arterial extravasation proximal to pelvic fractures. Embolization requested. EXAM: IR EMBO ART  VEN HEMORR LYMPH EXTRAV  INC GUIDE ROADMAPPING ANESTHESIA/SEDATION: Patient arrived intubated and under hemodynamic control by the Department of Anesthesia  personnel. MEDICATIONS: Lidocaine 1% subcutaneous CONTRAST:  145m OMNIPAQUE IOHEXOL 300 MG/ML  SOLN FLUOROSCOPY TIME:  4.5 minutes, 1403 mGy PROCEDURE: Due to the emergent nature of the situation, usual informed consent was deferred. Due to an indwelling right femoral venous catheter, the left femoral region prepped and draped in usual sterile fashion. Maximal barrier sterile technique was utilized including caps, mask, sterile gowns, sterile gloves, sterile drape, hand hygiene and skin antiseptic. The left common femoral artery was localized under ultrasound. Patency confirmed and documented. Under real-time ultrasound guidance, the vessel was accessed with a 21-gauge micropuncture needle, exchanged over a 018 guidewire for a transitional dilator, through which a 035 guidewire was advanced. Over this, a 5 [place] sheath was placed, through which a 5 [place]C2 catheter was advanced and used to selectively catheterize the right common iliac artery for selective pelvic arteriography. The C2 catheter was advanced with the aid of the [facility]wire into the distal internal iliac  artery. Confirmatory arteriography was performed. Embolization of internal iliac artery branches performed through the C2 catheter using a slurry of Gel-Foam pledgets in dilute contrast. The C2 catheter was then removed, flushed, and reintroduced into the right common iliac artery for confirmatory pelvic arteriography. A Waltman loop was formed with the C2 catheter, and the left common iliac artery was catheterized antegrade for selective pelvic arteriography. The C2 was advanced into the left internal iliac artery. Confirmatory arteriography was performed. Embolization of left internal iliac artery branches performed through the C2 catheter using a slurry of Gel-Foam pledgets and dilute contrast. The C2 catheter was then removed, flushed, and reintroduced into the left common iliac artery for confirmatory pelvic arteriography. The C2 was removed. A final left femoral arteriogram performed to document sheath placement. The sheath was secured externally with a 0 silk suture and flushed with saline. The patient tolerated the procedure well. COMPLICATIONS: None immediate FINDINGS: Technically successful empiric Gel-Foam embolization of bilateral internal iliac artery territories, without immediate complication. Due to the patient's coagulopathy and hemodynamic instability, the left femoral arterial catheter was secured in place as art line. IMPRESSION: 1. Technically successful bilateral internal iliac artery Gel-Foam embolization. Electronically Signed   By: DLucrezia EuropeM.D.   On: 02/09/2016 14:18   Ir Angiogram Follow Up Study  02/09/2016  CLINICAL DATA:  Comminuted and open pelvic fractures post blunt trauma. underwent pelvic external fixation device placement and intraoperative control of liver laceration. Postop CT demonstrates 2 foci of continued active arterial extravasation proximal to pelvic fractures. Embolization requested. EXAM: IR EMBO ART  VEN HEMORR LYMPH EXTRAV  INC GUIDE ROADMAPPING  ANESTHESIA/SEDATION: Patient arrived intubated and under hemodynamic control by the Department of Anesthesia personnel. MEDICATIONS: Lidocaine 1% subcutaneous CONTRAST:  1577mOMNIPAQUE IOHEXOL 300 MG/ML  SOLN FLUOROSCOPY TIME:  4.5 minutes, 1403 mGy PROCEDURE: Due to the emergent nature of the situation, usual informed consent was deferred. Due to an indwelling right femoral venous catheter, the left femoral region prepped and draped in usual sterile fashion. Maximal barrier sterile technique was utilized including caps, mask, sterile gowns, sterile gloves, sterile drape, hand hygiene and skin antiseptic. The left common femoral artery was localized under ultrasound. Patency confirmed and documented. Under real-time ultrasound guidance, the vessel was accessed with a 21-gauge micropuncture needle, exchanged over a 018 guidewire for a transitional dilator, through which a 035 guidewire was advanced. Over this, a 5 Fr[place]ascular sheath was placed, through which a 5 [place]2 catheter was advanced and used to selectively catheterize the right common  iliac artery for selective pelvic arteriography. The C2 catheter was advanced with the aid of the [facility] wire into the distal internal iliac artery. Confirmatory arteriography was performed. Embolization of internal iliac artery branches performed through the C2 catheter using a slurry of Gel-Foam pledgets in dilute contrast. The C2 catheter was then removed, flushed, and reintroduced into the right common iliac artery for confirmatory pelvic arteriography. A Waltman loop was formed with the C2 catheter, and the left common iliac artery was catheterized antegrade for selective pelvic arteriography. The C2 was advanced into the left internal iliac artery. Confirmatory arteriography was performed. Embolization of left internal iliac artery branches performed through the C2 catheter using a slurry of Gel-Foam pledgets and dilute contrast. The C2 catheter was then removed,  flushed, and reintroduced into the left common iliac artery for confirmatory pelvic arteriography. The C2 was removed. A final left femoral arteriogram performed to document sheath placement. The sheath was secured externally with a 0 silk suture and flushed with saline. The patient tolerated the procedure well. COMPLICATIONS: None immediate FINDINGS: Technically successful empiric Gel-Foam embolization of bilateral internal iliac artery territories, without immediate complication. Due to the patient's coagulopathy and hemodynamic instability, the left femoral arterial catheter was secured in place as art line. IMPRESSION: 1. Technically successful bilateral internal iliac artery Gel-Foam embolization. Electronically Signed   By: Lucrezia Europe M.D.   On: 02/09/2016 14:18   Ct Wrist Left Wo Contrast  02/10/2016  CLINICAL DATA:  Left wrist fracture, fall from a fifth Floor balcony. EXAM: CT OF THE LEFT WRIST WITHOUT CONTRAST TECHNIQUE: Multidetector CT imaging was performed according to the standard protocol. Multiplanar CT image reconstructions were also generated. COMPARISON:  02/09/2016 FINDINGS: The patient's wrist was scanned with the hand laying across the lower chest, which increases the signal to noise ratio due to scatter and makes cortical surfaces indistinct. Small part of the chest included demonstrates a small residual left anterior pneumothorax. The is a fracture the midbody of the scaphoid shown on image 31/3. Abnormally increased scapholunate angle probably indicating a tear of the scapholunate ligament. Coronally split appearance of the trapezoid, image 30/3 as well. Dorsal oblique fracture of the capitate with impaction of the distal capitate shown on image 36/3. There may be a volar component of the capitate fracture as well given the unusual morphology. Transverse fracture of the base of the hook of the hamate, image 45 series 3, with 2 mm displacement. A nondisplaced longitudinal fracture of the  second metacarpal is present, images 13 through 14 series 602. IMPRESSION: 1. Fractures in the wrist include transverse fracture of the midportion of the scaphoid; a coronally oriented split compression fracture of the trapezoid ; an axial loading compression fracture of the capitate with loss of height and probable dorsal and volar oblique fractures ; a transverse fracture of the base of the hook of the hamate ; and a longitudinal fracture extending from the base of the second metacarpal distally. I also suspect that the scapholunate ligament is likely torn. Sensitivity on today's exam is reduced due to the severity of streak artifact from positioning of the hand. 2. Small residual left pneumothorax noted anteriorly. Electronically Signed   By: Van Clines M.D.   On: 02/10/2016 15:08   Dg Pelvis Portable  02/09/2016  CLINICAL DATA:  [place] from several feet.  Right pelvis injury. EXAM: PORTABLE PELVIS 1-2 VIEWS COMPARISON:  None. FINDINGS: Fractures are noted through the right superior and inferior pubic rami. Possible fracture through the left  inferior pubic ramus. There is widening of the pubic symphysis. SI joints appear symmetric and unremarkable. IMPRESSION: Right superior and inferior pubic rami fractures. Possible fracture through the left inferior pubic ramus with diastases of the pubic symphysis. Electronically Signed   By: Rolm Baptise M.D.   On: 02/09/2016 09:30   Dg Pelvis Comp Min 3v  02/16/2016  CLINICAL DATA:  Hardware fixation of multiple pelvic fractures. EXAM: JUDET PELVIS - 3+ VIEW COMPARISON:  Previous examinations, including the pelvis CT dated 02/09/2016. FINDINGS: Interval placement of 2 screws. The upper screw is traversing both sacroiliac joints. The lower screw tip is overlying the left sacroiliac joint. Bilateral pubic fractures and diastases of the symphysis pubis are again demonstrated. IMPRESSION: Screw fixation of the sacrum and iliac bones, as described above.  Electronically Signed   By: Claudie Revering M.D.   On: 02/16/2016 15:06   Dg Pelvis 3v Judet  02/09/2016  CLINICAL DATA:  Pelvic trauma, fell out of a window 5 floors up, external fixator placement EXAM: DG C-ARM 61-120 MIN; JUDET PELVIS - 3+ VIEW COMPARISON:  Pelvic radiograph 02/09/2016 FLUOROSCOPY TIME:  1 minutes 21 seconds Images obtained:  8 FINDINGS: Images demonstrate placement of external fixator anchors at BILATERAL iliac bones. Displaced fractures of the RIGHT superior and inferior pubic rami noted. Disruption of pubic symphysis. Hips appear normally located. LEFT superior and inferior pubic rami fractures are less well visualized than on the prior study. RIGHT femoral line noted. Sponges project over pelvis. IMPRESSION: Placement of external fixation anchors at the iliac bones bilaterally. Pelvic fractures are better visualized on the preceding study. Electronically Signed   By: Lavonia Dana M.D.   On: 02/09/2016 13:24   Ir US Guide Vasc Access Left  02/09/2016  CLINICAL DATA:  Comminuted and open pelvic fractures post blunt trauma. underwent pelvic external fixation device placement and intraoperative control of liver laceration. Postop CT demonstrates 2 foci of continued active arterial extravasation proximal to pelvic fractures. Embolization requested. EXAM: IR EMBO ART  VEN HEMORR LYMPH EXTRAV  INC GUIDE ROADMAPPING ANESTHESIA/SEDATION: Patient arrived intubated and under hemodynamic control by the Department of Anesthesia personnel. MEDICATIONS: Lidocaine 1% subcutaneous CONTRAST:  149m OMNIPAQUE IOHEXOL 300 MG/ML  SOLN FLUOROSCOPY TIME:  4.5 minutes, 1403 mGy PROCEDURE: Due to the emergent nature of the situation, usual informed consent was deferred. Due to an indwelling right femoral venous catheter, the left femoral region prepped and draped in usual sterile fashion. Maximal barrier sterile technique was utilized including caps, mask, sterile gowns, sterile gloves, sterile drape, hand hygiene  and skin antiseptic. The left common femoral artery was localized under ultrasound. Patency confirmed and documented. Under real-time ultrasound guidance, the vessel was accessed with a 21-gauge micropuncture needle, exchanged over a 018 guidewire for a transitional dilator, through which a 035 guidewire was advanced. Over this, a 5 [place]vascular sheath was placed, through which a 5 FP[place]C2 catheter was advanced and used to selectively catheterize the right common iliac artery for selective pelvic arteriography. The C2 catheter was advanced with the aid of the B[facility] Lebanonwire into the distal internal iliac artery. Confirmatory arteriography was performed. Embolization of internal iliac artery branches performed through the C2 catheter using a slurry of Gel-Foam pledgets in dilute contrast. The C2 catheter was then removed, flushed, and reintroduced into the right common iliac artery for confirmatory pelvic arteriography. A Waltman loop was formed with the C2 catheter, and the left common iliac artery was catheterized antegrade for selective pelvic arteriography. The C2  was advanced into the left internal iliac artery. Confirmatory arteriography was performed. Embolization of left internal iliac artery branches performed through the C2 catheter using a slurry of Gel-Foam pledgets and dilute contrast. The C2 catheter was then removed, flushed, and reintroduced into the left common iliac artery for confirmatory pelvic arteriography. The C2 was removed. A final left femoral arteriogram performed to document sheath placement. The sheath was secured externally with a 0 silk suture and flushed with saline. The patient tolerated the procedure well. COMPLICATIONS: None immediate FINDINGS: Technically successful empiric Gel-Foam embolization of bilateral internal iliac artery territories, without immediate complication. Due to the patient's coagulopathy and hemodynamic instability, the left femoral arterial catheter was  secured in place as art line. IMPRESSION: 1. Technically successful bilateral internal iliac artery Gel-Foam embolization. Electronically Signed   By: Lucrezia Europe M.D.   On: 02/09/2016 14:18   Dg Chest Port 1 View  02/23/2016  CLINICAL DATA:  35 year old female with acute respiratory failure. Subsequent encounter. EXAM: PORTABLE CHEST 1 VIEW COMPARISON:  02/22/2016. FINDINGS: Multiple right-sided rib fractures, some displaced. Moderate to large right pleural effusion. Mild mediastinal shift to the left. Asymmetric airspace consolidation (greater on the right and involving left lung base) may represent asymmetric pulmonary edema although infectious infiltrate not excluded. Cardiomegaly. Limited evaluation of the aorta. Endotracheal tube tip 2 cm above the carina. IMPRESSION: Multiple right-sided rib fractures, some displaced. Moderate to large right pleural effusion. Mild mediastinal shift to the left. Asymmetric airspace consolidation (greater on the right and involving left lung base) may represent asymmetric pulmonary edema although infectious infiltrate not excluded. Cardiomegaly. Limited evaluation of the aorta. Electronically Signed   By: Genia Del M.D.   On: 02/23/2016 08:01   Dg Chest Port 1 View  02/22/2016  CLINICAL DATA:  Acute respiratory failure EXAM: PORTABLE CHEST 1 VIEW COMPARISON:  Chest radiograph from one day prior. FINDINGS: Endotracheal tube tip is 1.8 cm above the carina. Low lung volumes. Stable cardiomediastinal silhouette with normal heart size. No pneumothorax. Stable moderate right and small left pleural effusions. Stable bibasilar lung opacities. IMPRESSION: 1. Endotracheal tube tip 1.8 cm above the carina. 2. Stable moderate right and small left pleural effusions. 3. Low lung volumes. Stable bibasilar lung opacities, favor atelectasis, cannot exclude a component of aspiration or pneumonia. Electronically Signed   By: Ilona Sorrel M.D.   On: 02/22/2016 10:11   Dg Chest Port 1  View  02/21/2016  CLINICAL DATA:  Encounter for chest tube removal EXAM: PORTABLE CHEST 1 VIEW COMPARISON:  Earlier today FINDINGS: Right apical chest tube has been removed. No visible pneumothorax. A large right pleural effusion is unchanged. Unchanged left basilar pleural fluid or lobar collapse/consolidation. Right upper extremity PICC with tip at the SVC level. Stable endotracheal tube positioning with tip between the carina and clavicular heads. IMPRESSION: 1. No acute finding after chest tube removal. 2. Large right pleural effusion. 3. Opacified retrocardiac lung from collapse, pneumonia, or pleural fluid. Electronically Signed   By: Monte Fantasia M.D.   On: 02/21/2016 18:44   Dg Chest Port 1 View  02/21/2016  CLINICAL DATA:  35 year old female with a history of traumatic hemopneumothorax. EXAM: PORTABLE CHEST 1 VIEW COMPARISON:  02/10/2016, 02/19/2016, prior chest CT 02/09/2016 FINDINGS: Cardiomediastinal silhouette is unchanged in size and contour. Right heart border partially obscured by overlying lung and pleural disease. Dense opacification of the right pleural parenchymal interface persists, with hazy opacity overlying the lung. Right hemidiaphragm not well visualized. Endotracheal tube unchanged, terminating  suitably above the carina. Right thoracostomy tube remains terminating at the apex. Interval removal of the second right-sided thoracostomy tube. Unchanged right subclavian central venous catheter, terminating at the superior cavoatrial junction. Multiple minimally displaced right-sided rib fractures Left lung relatively well aerated, with retrocardiac opacification. IMPRESSION: Interval removal of 1 of 2 right-sided thoracostomy tubes. Persisting right-sided pleural fluid, with underlying airspace opacity, potentially consolidation or contusion. Unchanged endotracheal tube. Aeration of left lung relatively maintained, with retrocardiac opacity, potentially atelectasis, small pleural fluid,  or consolidation. Multiple minimally displaced right-sided rib fractures. Signed, Dulcy Fanny. Earleen Newport, DO [facility] Electronically Signed   By: Corrie Mckusick D.O.   On: 02/21/2016 09:23   Dg Chest Port 1 View  02/20/2016  CLINICAL DATA:  Respiratory failure. EXAM: PORTABLE CHEST 1 VIEW COMPARISON:  02/19/2016 . FINDINGS: Interim removal of NG tube. Endotracheal tube, right PICC line, right chest tubes in stable position . Diffuse right lung infiltrate and right pleural effusion again noted . Left lower lobe atelectasis and/or infiltrate again noted . No pneumothorax. Multiple right rib fractures again noted. IMPRESSION: 1. Lines and tubes including 2 right chest tube in stable position. No pneumothorax. 2. Diffuse right lung infiltrate and right pleural effusion again noted. Left lower lobe atelectasis and/or infiltrate again noted No interim change. 3.  Multiple right rib fractures again noted.  No pneumothorax. Electronically Signed   By: Marcello Moores  Register   On: 02/20/2016 08:01   Dg Chest Port 1 View  02/19/2016  CLINICAL DATA:  Status post significant chest trauma with multiple rib fractures. EXAM: PORTABLE CHEST 1 VIEW COMPARISON:  Portable chest x-ray of February 17, 2016 FINDINGS: There remains a large pleural effusion layering posteriorly and laterally on the right. The retrocardiac region on the left remains dense. There is no pneumothorax. The right-sided chest tube tip projects over the posterior aspect of the right second rib. Fractures of the second, third, fourth, and fifth ribs on the right are again demonstrated. The heart is normal in size. The endotracheal tube tip lies 4 cm above the carina. The esophagogastric tube tip projects below the inferior margin of the image. The right-sided PICC line tip projects over the distal third of the SVC. IMPRESSION: Fairly stable appearance of the chest since yesterday's study. The support tubes are in  stable position. There is a large right pleural effusion with small left pleural effusion. There is no pneumothorax. Electronically Signed   By: David  Martinique M.D.   On: 02/19/2016 08:02   Dg Chest Port 1 View  02/17/2016  CLINICAL DATA:  All respiratory failure. EXAM: PORTABLE CHEST 1 VIEW COMPARISON:  02/16/2016 FINDINGS: Right chest tubes remain in place, unchanged. Endotracheal tube, right PICC line and NG tube are unchanged. Stable right apical loculated pleural effusion. Stable left basilar atelectasis or infiltrate. Heart is normal size. No visible pneumothorax. IMPRESSION: No significant change since prior study.  No visible pneumothorax. Electronically Signed   By: Rolm Baptise M.D.   On: 02/17/2016 07:28   Dg Chest Port 1 View  02/16/2016  CLINICAL DATA:  Respiratory failure. EXAM: PORTABLE CHEST 1 VIEW COMPARISON:  02/15/2016. FINDINGS: Endotracheal tube, NG tube, right PICC line, to right chest tube in stable position. Heart size normal. Persistent diffuse right lung pulmonary infiltrates slightly improved from prior exam. Left lower lobe infiltrate again noted. Persistent bilateral pleural effusions with loculated apical effusion on the right again noted. Loculated right apical pleural effusion has improved slightly from prior exam.  No pneumothorax. Multiple displaced right rib fractures again noted. IMPRESSION: 1. Lines and tubes including 2 right chest tubes in stable position. Slightly improving right apical loculated pleural effusion. Persistent bilateral pleural effusions are present. No pneumothorax . 2. Persistent diffuse right lung infiltrate, slightly improved from prior exam. Persistent left lower lobe infiltrate, no change from prior exam. 3.  Multiple displaced right rib fractures again noted. Electronically Signed   By: Marcello Moores  Register   On: 02/16/2016 07:36   Dg Chest Port 1 View  02/15/2016  CLINICAL DATA:  Ventilator dependent respiratory failure. Followup right pneumothorax  with chest tubes in place. Patient admitted 02/09/2016 after falling from a 5th floor balcony sustaining multiple injuries. EXAM: PORTABLE CHEST 1 VIEW COMPARISON:  02/14/2016 and earlier, including CT chest 02/09/2016. FINDINGS: Endotracheal tube tip in satisfactory position projecting approximately 5 cm above the carina. Nasogastric tube courses below the diaphragm into the stomach. Two right chest tubes in place with resolution of the pneumothorax identified yesterday. Loculated right apicolateral pleural effusions/hemothorax, unchanged. Airspace opacity throughout the right lung and in the left lower lobe, worse than yesterday. Numerous comminuted, displaced right rib fractures again noted. IMPRESSION: 1.  Support apparatus satisfactory. 2. Two right chest tubes in place with resolution of the pneumothorax identified yesterday. 3. Stable right apical loculated pleural effusion/hemothorax. 4. Worsening atelectasis in both lungs since yesterday. Electronically Signed   By: Evangeline Dakin M.D.   On: 02/15/2016 07:51   Dg Chest Port 1 View  02/14/2016  CLINICAL DATA:  Ventilator dependent respiratory failure. Followup right pleural effusion with chest tubes in place. EXAM: PORTABLE CHEST 1 VIEW COMPARISON:  02/13/2016 and earlier. FINDINGS: Endotracheal tube tip below the thoracic inlet approximately 8 cm above the carina. Nasogastric tube courses below the diaphragm into the stomach. Right jugular introducer sheath tip at the junction of the jugular vein and SVC. Two right chest tubes in place with residual moderate size loculated right pleural effusion/hemothorax, decreased since yesterday. Small right lateral and basilar pneumothorax. Atelectasis in the lung bases. Passive atelectasis in the right upper lobe. No new pulmonary parenchymal abnormalities. IMPRESSION: 1. Support apparatus satisfactory. 2. Interval decrease in size of the loculated right pleural effusion/hemothorax with a small right basilar and  lateral pneumothorax on the order of 10-20%. 3. Bibasilar atelectasis and passive atelectasis in the right upper lobe. Electronically Signed   By: Evangeline Dakin M.D.   On: 02/14/2016 09:15   Dg Chest Port 1 View  02/13/2016  CLINICAL DATA:  Respiratory failure. EXAM: PORTABLE CHEST 1 VIEW COMPARISON:  February 11, 2016. FINDINGS: Nasogastric tube is seen entering the stomach. Due to radiographic technique, distal tip of endotracheal tube is not well visualized. Stable left lower lobe opacity is noted concerning for pneumonia. Two right-sided chest tubes are again noted, but the more inferior tube does appear to have been partially retracted. No pneumothorax is noted. There does appear to be an increased amount of loculated effusion involving the right hemithorax, particularly in the superior portion. IMPRESSION: Due to radiographic technique, distal tip of endotracheal tube is not visualized on this study. Stable left lung opacity is noted concerning for pneumonia. Two right-sided chest tubes remain, but the more inferior tube appears to have been withdrawn partially. There appears to be increased opacity in the right hemi thorax concerning for increased loculated pleural effusion. These results will be called to the ordering clinician or representative by the Radiologist Assistant, and communication documented in the PACS or zVision Dashboard. Electronically Signed  By: Marijo Conception, M.D.   On: 02/13/2016 07:55   Dg Chest Port 1 View  02/11/2016  CLINICAL DATA:  Right-sided hemothorax. EXAM: PORTABLE CHEST 1 VIEW COMPARISON:  02/11/16 at 01:23 FINDINGS: A second right chest tube has been placed, extending up into the apex. There is reduction of right pleural fluid volume. The other right chest tube remains unchanged in position. Endotracheal tube tip is 3.7 cm above the carina. Nasogastric tube extends into the stomach and off the inferior edge of the image. There is central and basilar left lung  consolidation which is probably unchanged. Extensive ground-glass opacities are present throughout the right lung but overall the right lung is better expanded with reduction of the pleural fluid volume. Displaced rib fractures are again evident on the right. IMPRESSION: 1. Placement of a second right chest tube with reduction of right pleural fluid volume. A small to moderate right effusion persists. 2. Satisfactory ET and NG tube positions. 3. Persistent airspace opacities bilaterally, probably unchanged from the earlier study. Electronically Signed   By: Andreas Newport M.D.   On: 02/11/2016 22:37   Dg Chest Port 1 View  02/11/2016  CLINICAL DATA:  Respiratory failure. EXAM: PORTABLE CHEST 1 VIEW COMPARISON:  02/10/2016 FINDINGS: The endotracheal tube tip is 4.5 cm above the carina. The nasogastric tube extends below the diaphragm and off the inferior edge of the image. The right chest tube appears unchanged in position. Displaced right rib fractures are again evident. No large pneumothorax. Mildly increased right pleural effusion. There is worsening opacification of the right hemithorax. Ground-glass opacities are worsening throughout both lungs. IMPRESSION: Support equipment appears satisfactorily positioned. Worsening airspace opacities bilaterally. Mildly increased right pleural effusion. No large pneumothorax. Electronically Signed   By: Andreas Newport M.D.   On: 02/11/2016 01:40   Dg Chest Port 1 View  02/10/2016  CLINICAL DATA:  Acute respiratory failure.  Acute chest trauma. EXAM: PORTABLE CHEST 1 VIEW COMPARISON:  02/09/2016 FINDINGS: Endotracheal tube in good position.  NG tube enters the stomach. Right chest tube remains in place. Multiple displaced right rib fractures. Right pleural effusion extending into the apex is slightly improved from yesterday consistent with hemothorax. No pneumothorax. Diffuse bilateral airspace disease similar to yesterday may represent aspiration or pulmonary  contusion. Trachea deviated to the left due to superior mediastinal hematoma as noted on CT. Patient rotated to the left. IMPRESSION: Endotracheal tube in good position Right chest tube in place with decrease in right hemothorax. No pneumothorax Diffuse bilateral airspace disease may be due to aspiration pneumonia or contusion Trachea again deviated to the left and unchanged due to superior mediastinal hematoma on the right. Electronically Signed   By: Franchot Gallo M.D.   On: 02/10/2016 08:17   Dg Chest Port 1 View  02/09/2016  CLINICAL DATA:  Hypoxia EXAM: PORTABLE CHEST 1 VIEW COMPARISON:  Chest radiograph and chest CT February 09, 2016 FINDINGS: Endotracheal tube tip is 2.6 cm above the carina. Chest tube is present on the right. Nasogastric tube tip and side port are below the diaphragm. There is a small right apical pneumothorax. There is extensive soft tissue air on the right. There is airspace consolidation throughout both lungs, more on the right than on the left, slightly progressed. Heart is upper normal in size with pulmonary vascularity within normal limits. No adenopathy evident. There are multiple rib fractures on the right. IMPRESSION: Tube positions as described. Small right apical pneumothorax without tension component. Extensive soft tissue air present  on the right. Extensive opacification in the lungs, progressed. Suspect noncardiogenic pulmonary edema coupled with parenchymal lung contusions and loculated effusion on the right. Early ARDS is also a differential consideration. Electronically Signed   By: Lowella Grip III M.D.   On: 02/09/2016 22:02   Dg Chest Port 1 View  02/09/2016  ADDENDUM REPORT: 02/09/2016 09:34 ADDENDUM: Compared to the chest radiograph at 0823 hours, there are increased densities along the left upper mediastinal region. A mediastinal hematoma cannot be excluded. Electronically Signed   By: Markus Daft M.D.   On: 02/09/2016 09:34  02/09/2016  CLINICAL DATA:  Trauma and  right chest injury. Evaluate chest tube insertion. EXAM: PORTABLE CHEST 1 VIEW COMPARISON:  02/09/2016 FINDINGS: Multiple displaced right rib fractures. Insertion of a large bore right chest tube. Side hole appears to be within the right chest cavity. There is increased subcutaneous gas in the right chest. No evidence for a large right pneumothorax. Prominent lung markings throughout the right lung with more confluent densities in the right apex region. Findings are probably associated with contusions. Endotracheal tube is near the carina pointing into the right mainstem bronchus. Nasogastric tube extends into the abdomen. Patient is rotated towards the left. Heart and mediastinum are grossly normal. Left lung is clear except for linear densities at the left lung base which could represent atelectasis. IMPRESSION: Endotracheal tube near the carina, pointing into the right mainstem bronchus. Placement of right chest tube with increased subcutaneous gas. No large pneumothorax. Patchy densities throughout the right lung, particularly at the apex. Findings most likely represent contusions. The report was called to the Operating Room on 02/09/2016 at 9:24 a.m. Electronically Signed: By: Markus Daft M.D. On: 02/09/2016 09:25   Dg Chest Portable 1 View  02/09/2016  CLINICAL DATA:  [place].  Right chest injury. EXAM: PORTABLE CHEST 1 VIEW COMPARISON:  None. FINDINGS: There are multiple displaced right rib fractures. There is subcutaneous gas in the right chest. No evidence for a large pneumothorax. Patchy densities throughout the right lung, particularly in the right upper lobe lobe. Patient is rotated towards the left. There are densities along the upper mediastinum and cannot exclude a mediastinal hematoma. Heart size is grossly normal. The entire left lung base is not visualized. IMPRESSION: Multiple displaced right rib fractures with subcutaneous gas. Patchy densities throughout the right chest most likely represent  contusions. No evidence for a large pneumothorax. Limited evaluation the mediastinum due to patient rotation. Increased densities along the upper mediastinum and concern for mediastinal fluid or hematoma. This report was called to the operating room on 02/09/2016 at 9:30 a.m. Electronically Signed   By: Markus Daft M.D.   On: 02/09/2016 09:32   Dg Ankle Right Port  02/09/2016  CLINICAL DATA:  Swelling at abrasions in the right ankle after jump from balcony today. EXAM: PORTABLE RIGHT ANKLE - 2 VIEW COMPARISON:  None. FINDINGS: Diffuse right ankle soft tissue swelling, most prominent laterally. No fracture, subluxation, focal osseous lesion or appreciable arthropathy. No pathologic soft tissue densities. IMPRESSION: Prominent right ankle soft tissue swelling, with no fracture or malalignment on this two view study. Electronically Signed   By: Ilona Sorrel M.D.   On: 02/09/2016 15:40   Dg C-arm 1-60 Min  02/09/2016  CLINICAL DATA:  Pelvic trauma, fell out of a window 5 floors up, external fixator placement EXAM: DG C-ARM 61-120 MIN; JUDET PELVIS - 3+ VIEW COMPARISON:  Pelvic radiograph 02/09/2016 FLUOROSCOPY TIME:  1 minutes 21 seconds Images obtained:  8 FINDINGS: Images demonstrate placement of external fixator anchors at BILATERAL iliac bones. Displaced fractures of the RIGHT superior and inferior pubic rami noted. Disruption of pubic symphysis. Hips appear normally located. LEFT superior and inferior pubic rami fractures are less well visualized than on the prior study. RIGHT femoral line noted. Sponges project over pelvis. IMPRESSION: Placement of external fixation anchors at the iliac bones bilaterally. Pelvic fractures are better visualized on the preceding study. Electronically Signed   By: Lavonia Dana M.D.   On: 02/09/2016 13:24   Dg C-arm 61-120 Min  02/16/2016  CLINICAL DATA:  Sacroiliac joint pinning EXAM: DG C-ARM 61-120 MIN; BILATERAL SACROILIAC JOINTS - 3+ VIEW COMPARISON:  02/09/2016 CT abdomen/  pelvis and pelvic radiograph. FINDINGS: Fluoroscopy time 2 minutes 35 seconds. Seven spot fluoroscopic nondiagnostic intraoperative radiographs of the sacroiliac joints were provided, which demonstrate placement of 2 horizontal pins traversing the bilateral sacroiliac joints. IMPRESSION: Intraoperative fluoroscopic guidance for pin placement traversing the sacroiliac joints. Electronically Signed   By: Ilona Sorrel M.D.   On: 02/16/2016 13:32   [place] Guide Roadmapping  02/09/2016  CLINICAL DATA:  Comminuted and open pelvic fractures post blunt trauma. underwent pelvic external fixation device placement and intraoperative control of liver laceration. Postop CT demonstrates 2 foci of continued active arterial extravasation proximal to pelvic fractures. Embolization requested. EXAM: IR EMBO ART  VEN HEMORR LYMPH EXTRAV  INC GUIDE ROADMAPPING ANESTHESIA/SEDATION: Patient arrived intubated and under hemodynamic control by the Department of Anesthesia personnel. MEDICATIONS: Lidocaine 1% subcutaneous CONTRAST:  124m OMNIPAQUE IOHEXOL 300 MG/ML  SOLN FLUOROSCOPY TIME:  4.5 minutes, 1403 mGy PROCEDURE: Due to the emergent nature of the situation, usual informed consent was deferred. Due to an indwelling right femoral venous catheter, the left femoral region prepped and draped in usual sterile fashion. Maximal barrier sterile technique was utilized including caps, mask, sterile gowns, sterile gloves, sterile drape, hand hygiene and skin antiseptic. The left common femoral artery was localized under ultrasound. Patency confirmed and documented. Under real-time ultrasound guidance, the vessel was accessed with a 21-gauge micropuncture needle, exchanged over a 018 guidewire for a transitional dilator, through which a 035 guidewire was advanced. Over this, a 5 [place] sheath was placed, through which a 5 [place] catheter was advanced and used to selectively catheterize the right  common iliac artery for selective pelvic arteriography. The C2 catheter was advanced with the aid of the BLenox Hill Hospitalwire into the distal internal iliac artery. Confirmatory arteriography was performed. Embolization of internal iliac artery branches performed through the C2 catheter using a slurry of Gel-Foam pledgets in dilute contrast. The C2 catheter was then removed, flushed, and reintroduced into the right common iliac artery for confirmatory pelvic arteriography. A Waltman loop was formed with the C2 catheter, and the left common iliac artery was catheterized antegrade for selective pelvic arteriography. The C2 was advanced into the left internal iliac artery. Confirmatory arteriography was performed. Embolization of left internal iliac artery branches performed through the C2 catheter using a slurry of Gel-Foam pledgets and dilute contrast. The C2 catheter was then removed, flushed, and reintroduced into the left common iliac artery for confirmatory pelvic arteriography. The C2 was removed. A final left femoral arteriogram performed to document sheath placement. The sheath was secured externally with a 0 silk suture and flushed with saline. The patient tolerated the procedure well. COMPLICATIONS: None immediate FINDINGS: Technically successful empiric Gel-Foam embolization of bilateral internal iliac artery territories, without  immediate complication. Due to the patient's coagulopathy and hemodynamic instability, the left femoral arterial catheter was secured in place as art line. IMPRESSION: 1. Technically successful bilateral internal iliac artery Gel-Foam embolization. Electronically Signed   By: Lucrezia Europe M.D.   On: 02/09/2016 14:18    ECG & Cardiac Imaging    NSR with normal intervals. No STTWC c/w ischemia.   Assessment & Plan    15F s/p suicide attempt via toxic injestion and jump from 5th floor hotel room who has sustained substantial trauma including severe blunt injury to her thorax with  fractures of ribs 2 through 12 on the right side. She has a small to moderate right pleural effusion and probably pulmonary parenchymal contusion. Course also notable for acute renal failure with profound uremia, volume overload with profound hypernatremia.  Today she had irregular but generally MMVT with max rate of about 160-170bpm for about 35 seconds. This occurred about 30 minutes after receiving routine duonebs and in the setting of a K 3.3.  Although she has not been formally diagnosed with cardiac contusion, I suspect that she does have some amount of contusion which, in the setting of profound metabolic disarray including hypokalemia and inhaled beta agonism, led to VT.  This is more likely an automatic focus due to irritability (c/w the degree of irregularity several salvos) rather than a scar based mechanism. She will be unlikely to require an ICD if she is able to get through this critical illness.   - Would avoid duonebs and replace with ipratropium only - start metoprolol tartrate 12.22m BID - Supplement K to goal 4.0 - goal Mg 2.0 - TTE in AM   Signed, FLamar Sprinkles MD 02/23/2016, 9:23 PM

## 2016-02-23 NOTE — Progress Notes (Signed)
Patient ID: [name], female   DOB: May 20, 1981, 35 y.o.   MRN: [id number] Notified by RN patient had a 1 min run of VT. Broke spontaneously. SBP 120, now F/C. EKG shows NSR with ? T wave abn. Will check BMET, Mg, Phos. I will ask cardiology to see. Her sister is at the bedside.  [name] GelinasBurke Ephram Kornegay, MD, MPH, FACS Trauma: [phone] General Surgery: [phone]

## 2016-02-23 NOTE — Clinical Social Work Note (Signed)
Clinical Social Worker continuing to follow patient and family for support.  Patient currently remains on the ventilator with poor renal function.  Patient having a procedure at bedside.  CSW remains available for patient and family support as needed.  Macario GoldsJesse Bartholomew Ramesh, Kentucky[place]CS[place] [phone]

## 2016-02-23 NOTE — Progress Notes (Signed)
~  2022: Noted pt to have wide complexes, VT appearing, on monitor. In room with pt. Pt eyes closed. Attempt to palpate pulse, pt opens eyes to stimulation. Noted VTach to last ~941min. Vitals taken, WNL.Trauma paged. ~2030: MD in room. EKG being completed. Labs ordered. Discussed vitals and course of care.  Will continue to monitor.

## 2016-02-23 NOTE — Progress Notes (Signed)
[facility] Renal Progress Note  Requesting MD: Trauma Indication for Consultation: AKI  S:  [name] Bell is a 35 y.o. female with obesity but no other known past medical history. She was admitted on 4/3 after she jumped from her fifth floor hotel room and also had ingested some drain cleaner.  Remains intubated.  BUN/Cr continues to rise.  Sitter at bedside.  CREATININE, SER  Date/Time Value Ref Range Status  02/23/2016 04:00 AM 2.72* 0.44 - 1.00 mg/dL Final  02/22/2016 04:15 AM 2.24* 0.44 - 1.00 mg/dL Final  02/21/2016 05:00 AM 1.64* 0.44 - 1.00 mg/dL Final  02/20/2016 05:30 AM 1.05* 0.44 - 1.00 mg/dL Final  02/19/2016 05:30 AM 1.00 0.44 - 1.00 mg/dL Final  02/18/2016 08:40 AM 1.02* 0.44 - 1.00 mg/dL Final  02/17/2016 05:10 AM 1.34* 0.44 - 1.00 mg/dL Final  02/16/2016 05:52 AM 1.58* 0.44 - 1.00 mg/dL Final  02/15/2016 06:00 AM 1.88* 0.44 - 1.00 mg/dL Final  02/14/2016 04:51 AM 1.92* 0.44 - 1.00 mg/dL Final  02/13/2016 06:00 AM 2.10* 0.44 - 1.00 mg/dL Final  02/12/2016 10:25 AM 2.02* 0.44 - 1.00 mg/dL Final  02/12/2016 03:10 AM 1.92* 0.44 - 1.00 mg/dL Final  02/11/2016 04:43 AM 1.91* 0.44 - 1.00 mg/dL Final  02/10/2016 08:21 PM 1.77* 0.44 - 1.00 mg/dL Final  02/10/2016 04:05 AM 1.22* 0.44 - 1.00 mg/dL Final  02/09/2016 03:55 PM 0.98 0.44 - 1.00 mg/dL Final  02/09/2016 08:40 AM 1.20* 0.44 - 1.00 mg/dL Final  02/09/2016 08:29 AM 1.15* 0.44 - 1.00 mg/dL Final   Medications: Prior to Admission medications   Not on File    I have reviewed the patient's current medications.  Labs:  Results for orders placed or performed during the hospital encounter of 02/09/16 (from the past 48 hour(s))  Glucose, capillary     Status: Abnormal   Collection Time: 02/21/16 11:31 AM  Result Value Ref Range   Glucose-Capillary 139 (H) 65 - 99 mg/dL  Glucose, capillary     Status: Abnormal   Collection Time: 02/21/16  3:44 PM  Result Value Ref Range   Glucose-Capillary 131 (H) 65  - 99 mg/dL  Glucose, capillary     Status: Abnormal   Collection Time: 02/21/16  7:41 PM  Result Value Ref Range   Glucose-Capillary 124 (H) 65 - 99 mg/dL  Glucose, capillary     Status: Abnormal   Collection Time: 02/21/16 11:08 PM  Result Value Ref Range   Glucose-Capillary 118 (H) 65 - 99 mg/dL  Glucose, capillary     Status: Abnormal   Collection Time: 02/22/16  4:01 AM  Result Value Ref Range   Glucose-Capillary 116 (H) 65 - 99 mg/dL  CBC     Status: Abnormal   Collection Time: 02/22/16  4:15 AM  Result Value Ref Range   WBC 32.2 (H) 4.0 - 10.5 K/uL   RBC 2.55 (L) 3.87 - 5.11 MIL/uL   Hemoglobin 7.5 (L) 12.0 - 15.0 g/dL   HCT 23.8 (L) 36.0 - 46.0 %   MCV 93.3 78.0 - 100.0 fL   MCH 29.4 26.0 - 34.0 pg   MCHC 31.5 30.0 - 36.0 g/dL   RDW 21.4 (H) 11.5 - 15.5 %   Platelets 249 150 - 400 K/uL  Basic metabolic panel     Status: Abnormal   Collection Time: 02/22/16  4:15 AM  Result Value Ref Range   Sodium 156 (H) 135 - 145 mmol/L   Potassium 3.8 3.5 - 5.1 mmol/L  Chloride 116 (H) 101 - 111 mmol/L   CO2 28 22 - 32 mmol/L   Glucose, Bld 151 (H) 65 - 99 mg/dL   BUN 106 (H) 6 - 20 mg/dL   Creatinine, Ser 2.24 (H) 0.44 - 1.00 mg/dL   Calcium 8.4 (L) 8.9 - 10.3 mg/dL   GFR calc non Af Amer 28 (L) >60 mL/min   GFR calc Af Amer 32 (L) >60 mL/min    Comment: (NOTE) The eGFR has been calculated using the CKD EPI equation. This calculation has not been validated in all clinical situations. eGFR's persistently <60 mL/min signify possible Chronic Kidney Disease.    Anion gap 12 5 - 15  Glucose, capillary     Status: Abnormal   Collection Time: 02/22/16  7:21 AM  Result Value Ref Range   Glucose-Capillary 121 (H) 65 - 99 mg/dL  Glucose, capillary     Status: Abnormal   Collection Time: 02/22/16 11:47 AM  Result Value Ref Range   Glucose-Capillary 143 (H) 65 - 99 mg/dL  Urinalysis, Routine w reflex microscopic (not at [facility])     Status: Abnormal   Collection Time: 02/22/16  2:59  PM  Result Value Ref Range   Color, Urine ORANGE (A) YELLOW    Comment: BIOCHEMICALS MAY BE AFFECTED BY COLOR   APPearance TURBID (A) CLEAR   Specific Gravity, Urine 1.017 1.005 - 1.030   pH 5.5 5.0 - 8.0   Glucose, UA NEGATIVE NEGATIVE mg/dL   Hgb urine dipstick LARGE (A) NEGATIVE   Bilirubin Urine LARGE (A) NEGATIVE   Ketones, ur 15 (A) NEGATIVE mg/dL   Protein, ur 100 (A) NEGATIVE mg/dL   Nitrite NEGATIVE NEGATIVE   Leukocytes, UA SMALL (A) NEGATIVE  Urine microscopic-add on     Status: Abnormal   Collection Time: 02/22/16  2:59 PM  Result Value Ref Range   Squamous Epithelial / LPF 6-30 (A) NONE SEEN   WBC, UA 0-5 0 - 5 WBC/hpf   RBC / HPF 6-30 0 - 5 RBC/hpf   Bacteria, UA FEW (A) NONE SEEN  Glucose, capillary     Status: Abnormal   Collection Time: 02/22/16  3:20 PM  Result Value Ref Range   Glucose-Capillary 126 (H) 65 - 99 mg/dL  Glucose, capillary     Status: Abnormal   Collection Time: 02/22/16  7:45 PM  Result Value Ref Range   Glucose-Capillary 121 (H) 65 - 99 mg/dL  Glucose, capillary     Status: Abnormal   Collection Time: 02/22/16 11:35 PM  Result Value Ref Range   Glucose-Capillary 132 (H) 65 - 99 mg/dL  Glucose, capillary     Status: Abnormal   Collection Time: 02/23/16  3:37 AM  Result Value Ref Range   Glucose-Capillary 107 (H) 65 - 99 mg/dL  CBC     Status: Abnormal   Collection Time: 02/23/16  4:00 AM  Result Value Ref Range   WBC 28.5 (H) 4.0 - 10.5 K/uL   RBC 2.40 (L) 3.87 - 5.11 MIL/uL   Hemoglobin 7.3 (L) 12.0 - 15.0 g/dL   HCT 22.5 (L) 36.0 - 46.0 %   MCV 93.8 78.0 - 100.0 fL   MCH 30.4 26.0 - 34.0 pg   MCHC 32.4 30.0 - 36.0 g/dL   RDW 21.9 (H) 11.5 - 15.5 %   Platelets 265 150 - 400 K/uL  Basic metabolic panel     Status: Abnormal   Collection Time: 02/23/16  4:00 AM  Result Value Ref Range  Sodium 156 (H) 135 - 145 mmol/L   Potassium 3.7 3.5 - 5.1 mmol/L   Chloride 116 (H) 101 - 111 mmol/L   CO2 28 22 - 32 mmol/L   Glucose, Bld 123  (H) 65 - 99 mg/dL   BUN 132 (H) 6 - 20 mg/dL   Creatinine, Ser 2.72 (H) 0.44 - 1.00 mg/dL   Calcium 8.9 8.9 - 10.3 mg/dL   GFR calc non Af Amer 22 (L) >60 mL/min   GFR calc Af Amer 25 (L) >60 mL/min    Comment: (NOTE) The eGFR has been calculated using the CKD EPI equation. This calculation has not been validated in all clinical situations. eGFR's persistently <60 mL/min signify possible Chronic Kidney Disease.    Anion gap 12 5 - 15  Glucose, capillary     Status: Abnormal   Collection Time: 02/23/16  7:55 AM  Result Value Ref Range   Glucose-Capillary 122 (H) 65 - 99 mg/dL   Comment 1 Notify RN    Comment 2 Document in Chart    ROS:  Review of systems not obtained due to patient factors.  Physical Exam: Filed Vitals:   02/23/16 0700 02/23/16 0800  BP: 116/72   Pulse: 95   Temp:  98.3 F (36.8 C)  Resp: 23      General: Obese black female sedated on vent agitated with stimuli, sitter at bedside HEENT: MMM Heart: RRR, no murmurs Lungs: on vent, intermittently coarse breath sounds Abdomen: wound vac in place GU: dark rust colored urine in Foley bag Extremities: Warm, 3+ pitting edema Skin: Warm and dry Neuro: Sedated  Assessment/Plan: 35 year old black female with no medical history and normal renal function at baseline. She presents after a traumatic fall requiring multiple surgeries. She had acute kidney injury earlier in hospitalization in now appears to have recurrence of that 1.Renal- second episode of acute kidney injury this hospitalization. BP more soft than normal overnight but stable.  Continues to have good UOP.  BUN/Cr rising.  K stable. Will continue to monitor closely.  No indications for dialysis at this time.  CK ordered. 2. Hypertension/volume  - she is massively volume overloaded. However, she is also freewater deficient.  BPs tolerating Lasix.  Will continue this. 3. Anemia  - situational, supportive care as needed 4. Hypernatremia- Stable from  yesterday.continue free water through her oral tube.  Ronnie Doss, DO 02/23/2016, 9:23 AM

## 2016-02-23 NOTE — Progress Notes (Signed)
Follow up - Trauma and Critical Care  Patient Details:    [name] Bell is an 35 y.o. female.  Lines/tubes : Airway 7.5 mm (Active)  Secured at (cm) 25 cm 02/23/2016  4:00 AM  Measured From Lips 02/23/2016  4:00 AM  Secured Location Left 02/23/2016  4:00 AM  Secured By [employer] 02/23/2016  4:00 AM  Tube Holder Repositioned Yes 02/23/2016  4:00 AM  Cuff Pressure (cm H2O) 26 cm H2O 02/23/2016 12:30 AM  Site Condition Dry 02/23/2016  4:00 AM     PICC Triple Lumen 02/15/16 PICC Right Cephalic 55 cm 0 cm (Active)  Indication for Insertion or Continuance of Line Limited venous access - need for IV therapy >5 days (PICC only) 02/22/2016  8:00 PM  Exposed Catheter (cm) 0 cm 02/15/2016  1:23 PM  Site Assessment Clean;Dry;Intact 02/22/2016  8:00 PM  Lumen #1 Status Infusing 02/22/2016  8:00 PM  Lumen #2 Status Infusing 02/22/2016  8:00 PM  Lumen #3 Status Capped (Central line);Flushed 02/22/2016  8:00 PM  Dressing Type Transparent 02/22/2016  8:00 PM  Dressing Status Clean;Dry;Intact;Antimicrobial disc in place 02/22/2016  8:00 PM  Line Care Connections checked and tightened;Zeroed and calibrated;Leveled 02/22/2016  8:00 PM  Line Adjustment (NICU/IV Team Only) No 02/15/2016  1:23 PM  Dressing Intervention Dressing changed;Antimicrobial disc changed 02/22/2016  2:00 PM  Dressing Change Due 02/29/16 02/22/2016  8:00 PM     Negative Pressure Wound Therapy Abdomen Mid (Active)  Last dressing change 02/20/16 02/22/2016  8:00 PM  Site / Wound Assessment Dressing in place / Unable to assess 02/22/2016  8:00 PM  Peri-wound Assessment Intact 02/22/2016  8:00 PM  Cycle Continuous 02/22/2016  7:27 AM  Target Pressure (mmHg) 125 02/22/2016  7:27 AM  Canister Changed No 02/22/2016  7:27 AM  Dressing Status Intact 02/22/2016  7:27 AM  Drainage Amount Moderate 02/22/2016  7:27 AM  Drainage Description Serosanguineous 02/22/2016  7:27 AM  Output (mL) 1000 mL 02/23/2016 12:00 AM     Gastrostomy/Enterostomy  Gastrostomy 24 Fr. LUQ (Active)  Surrounding Skin Dry;Intact 02/23/2016  4:00 AM  Tube Status Patent 02/23/2016  4:00 AM  Dressing Status Clean;Dry;Intact 02/22/2016  7:27 AM  Output (mL) 100 mL 02/18/2016  6:20 PM     Urethral Catheter S. Bowman,RN Latex 16 Fr. (Active)  Indication for Insertion or Continuance of Catheter Bladder outlet obstruction / other urologic reason 02/22/2016  8:00 PM  Site Assessment Intact 02/22/2016  8:00 PM  Catheter Maintenance Bag below level of bladder;Catheter secured;Drainage bag/tubing not touching floor;Insertion date on drainage bag;No dependent loops;Seal intact 02/22/2016  8:00 PM  Collection Container Standard drainage bag 02/22/2016  8:00 PM  Securement Method Securing device (Describe) 02/22/2016  8:00 PM  Urinary Catheter Interventions Unclamped 02/22/2016  7:27 AM  Output (mL) 500 mL 02/23/2016  4:00 AM    Microbiology/Sepsis markers: Results for orders placed or performed during the hospital encounter of 02/09/16  MRSA PCR Screening     Status: None   Collection Time: 02/11/16 11:30 AM  Result Value Ref Range Status   MRSA by PCR NEGATIVE NEGATIVE Final    Comment:        The GeneXpert MRSA Assay (FDA approved for NASAL specimens only), is one component of a comprehensive MRSA colonization surveillance program. It is not intended to diagnose MRSA infection nor to guide or monitor treatment for MRSA infections.     Anti-infectives:  Anti-infectives    Start     Dose/Rate Route  Frequency Ordered Stop   02/11/16 0600  piperacillin-tazobactam (ZOSYN) IVPB 3.375 g  Status:  Discontinued     3.375 g 100 mL/hr over 30 Minutes Intravenous 4 times per day 02/10/16 2214 02/22/16 0853   02/10/16 1730  piperacillin-tazobactam (ZOSYN) IVPB 3.375 g  Status:  Discontinued     3.375 g 12.5 mL/hr over 240 Minutes Intravenous 3 times per day 02/10/16 1727 02/10/16 2214      Best Practice/Protocols:  VTE Prophylaxis: Lovenox (prophylaxtic dose) and  Mechanical GI Prophylaxis: Proton Pump Inhibitor Continous Sedation  Consults: Treatment Team:  Myrene Galas, MD Annie Sable, MD    Events:  Subjective:    Overnight Issues: Patient is doing well.  Stable, but renal function still abnormal.  Objective:  Vital signs for last 24 hours: Temp:  [97.7 F (36.5 C)-100.1 F (37.8 C)] 98.7 F (37.1 C) (04/17 0400) Pulse Rate:  [76-101] 95 (04/17 0700) Resp:  [14-28] 23 (04/17 0700) BP: (95-120)/(48-98) 116/72 mmHg (04/17 0700) SpO2:  [94 %-99 %] 97 % (04/17 0700) FiO2 (%):  [40 %] 40 % (04/17 0400) Weight:  [137.6 kg (303 lb 5.7 oz)] 137.6 kg (303 lb 5.7 oz) (04/17 0354)  Hemodynamic parameters for last 24 hours:    Intake/Output from previous day: 04/16 0701 - 04/17 0700 In: 3520 [I.V.:1230; NG/GT:2290] Out: 3285 [Urine:2285; Drains:1000]  Intake/Output this shift:    Vent settings for last 24 hours: Vent Mode:  [-] PRVC FiO2 (%):  [40 %] 40 % Set Rate:  [20 bmp] 20 bmp Vt Set:  [490 mL] 490 mL PEEP:  [8 cmH20] 8 cmH20 Pressure Support:  [8 cmH20] 8 cmH20 Plateau Pressure:  [24 cmH20-26 cmH20] 26 cmH20  Physical Exam:  General: alert and no respiratory distress Neuro: alert, oriented and nonfocal exam Resp: rhonchi bilaterally CVS: regular rate and rhythm, S1, S2 normal, no murmur, click, rub or gallop GI: Open abdomen, NPWD in place, to be adjusted later this AM.  Tolerating tube feedings.  Extremities: edema 4+ and all extremities are edematous.  Results for orders placed or performed during the hospital encounter of 02/09/16 (from the past 24 hour(s))  Glucose, capillary     Status: Abnormal   Collection Time: 02/22/16 11:47 AM  Result Value Ref Range   Glucose-Capillary 143 (H) 65 - 99 mg/dL  Urinalysis, Routine w reflex microscopic (not at [facility])     Status: Abnormal   Collection Time: 02/22/16  2:59 PM  Result Value Ref Range   Color, Urine ORANGE (A) YELLOW   APPearance TURBID (A) CLEAR    Specific Gravity, Urine 1.017 1.005 - 1.030   pH 5.5 5.0 - 8.0   Glucose, UA NEGATIVE NEGATIVE mg/dL   Hgb urine dipstick LARGE (A) NEGATIVE   Bilirubin Urine LARGE (A) NEGATIVE   Ketones, ur 15 (A) NEGATIVE mg/dL   Protein, ur 606 (A) NEGATIVE mg/dL   Nitrite NEGATIVE NEGATIVE   Leukocytes, UA SMALL (A) NEGATIVE  Urine microscopic-add on     Status: Abnormal   Collection Time: 02/22/16  2:59 PM  Result Value Ref Range   Squamous Epithelial / LPF 6-30 (A) NONE SEEN   WBC, UA 0-5 0 - 5 WBC/hpf   RBC / HPF 6-30 0 - 5 RBC/hpf   Bacteria, UA FEW (A) NONE SEEN  Glucose, capillary     Status: Abnormal   Collection Time: 02/22/16  3:20 PM  Result Value Ref Range   Glucose-Capillary 126 (H) 65 - 99 mg/dL  Glucose, capillary  Status: Abnormal   Collection Time: 02/22/16  7:45 PM  Result Value Ref Range   Glucose-Capillary 121 (H) 65 - 99 mg/dL  Glucose, capillary     Status: Abnormal   Collection Time: 02/22/16 11:35 PM  Result Value Ref Range   Glucose-Capillary 132 (H) 65 - 99 mg/dL  Glucose, capillary     Status: Abnormal   Collection Time: 02/23/16  3:37 AM  Result Value Ref Range   Glucose-Capillary 107 (H) 65 - 99 mg/dL  CBC     Status: Abnormal   Collection Time: 02/23/16  4:00 AM  Result Value Ref Range   WBC 28.5 (H) 4.0 - 10.5 K/uL   RBC 2.40 (L) 3.87 - 5.11 MIL/uL   Hemoglobin 7.3 (L) 12.0 - 15.0 g/dL   HCT 16.1 (L) 09.6 - 04.5 %   MCV 93.8 78.0 - 100.0 fL   MCH 30.4 26.0 - 34.0 pg   MCHC 32.4 30.0 - 36.0 g/dL   RDW 40.9 (H) 81.1 - 91.4 %   Platelets 265 150 - 400 K/uL  Basic metabolic panel     Status: Abnormal   Collection Time: 02/23/16  4:00 AM  Result Value Ref Range   Sodium 156 (H) 135 - 145 mmol/L   Potassium 3.7 3.5 - 5.1 mmol/L   Chloride 116 (H) 101 - 111 mmol/L   CO2 28 22 - 32 mmol/L   Glucose, Bld 123 (H) 65 - 99 mg/dL   BUN 782 (H) 6 - 20 mg/dL   Creatinine, Ser 9.56 (H) 0.44 - 1.00 mg/dL   Calcium 8.9 8.9 - 21.3 mg/dL   GFR calc non Af Amer  22 (L) >60 mL/min   GFR calc Af Amer 25 (L) >60 mL/min   Anion gap 12 5 - 15     Assessment/Plan:   NEURO  Altered Mental Status:  sedation   Plan: Wean as we try to wean the ventilator  PULM  Atelectasis/collapse (focal and entire right lung at risk) Hemothorax (from initial injury) and Pleural Effusion (right, large and related to previous trauma) Chest Wall Trauma Multiple right rib fractures and Lung Trauma (right, with contusion of lung and with laceration of lung)   Plan: Will consider thoracic surgical consultation  CARDIO  No significant issues.   Plan: CPM  RENAL  Urine output is good but stupid urine Actue Renal Failure (acute tubular necrosis, due to hypovolemia/decreased circulating volume and etiology unknown) Hypernatremia severe (>155 meq/dl), due to volume depletion and Getting free water in FT.  I's and O's are balanced from yesterday. Third Spacing   Plan: Will need some assistance from renal service about how to improve this process.  GI  Bowel Trauma with SB resection, Hepatic Trauma and Renal Trauma non=operative management.   Plan: Will need to adjust ABRA device and NPWD at bedside later this morning.  Fluid balance is an issue, and currently she seems intravascularly contracted with a lot of third-spacing.  Not sure if we should be pushing more fluid since her BUN and creatinine increased today.    ID  No known infectious sources although her WBC has been elevated (down today) and is currerntly afebrile   Plan: CPM  HEME  Anemia acute blood loss anemia, anemia of critical illness and anemia of renal disease)   Plan: No blood needed for now.  ENDO No specific issues today with the exception of renal   Plan: CPM  Global Issues  Patient needs adjustment of her ABRA device  and NPWD change.  Will perform later this AM.  Also her fluid balance is disrupted and renal function is poor in spite of making urine.  Will continue to enlist the services of renal, in  the meantime will continue free water hydration.    LOS: 14 days   Additional comments:I reviewed the patient's new clinical lab test results. cbc/bmet and I reviewed the patients new imaging test results. cxr  Critical Care Total Time*: 45 Minutes  Claretta Kendra 02/23/2016  *Care during the described time interval was provided by me and/or other providers on the critical care team.  I have reviewed this patient's available data, including medical history, events of note, physical examination and test results as part of my evaluation.

## 2016-02-24 ENCOUNTER — Inpatient Hospital Stay (HOSPITAL_COMMUNITY): Payer: Medicaid Other

## 2016-02-24 DIAGNOSIS — I472 Ventricular tachycardia: Secondary | ICD-10-CM

## 2016-02-24 LAB — BLOOD GAS, ARTERIAL
Acid-Base Excess: 2.5 mmol/L — ABNORMAL HIGH (ref 0.0–2.0)
Bicarbonate: 27 mEq/L — ABNORMAL HIGH (ref 20.0–24.0)
Drawn by: 362771
FIO2: 0.4
MECHVT: 490 mL
O2 Saturation: 94.7 %
PEEP: 8 cmH2O
Patient temperature: 98.6
RATE: 20 resp/min
TCO2: 28.4 mmol/L (ref 0–100)
pCO2 arterial: 45.2 mmHg — ABNORMAL HIGH (ref 35.0–45.0)
pH, Arterial: 7.393 (ref 7.350–7.450)
pO2, Arterial: 73.7 mmHg — ABNORMAL LOW (ref 80.0–100.0)

## 2016-02-24 LAB — PROTIME-INR
INR: 1.47 (ref 0.00–1.49)
Prothrombin Time: 17.9 seconds — ABNORMAL HIGH (ref 11.6–15.2)

## 2016-02-24 LAB — GLUCOSE, CAPILLARY
GLUCOSE-CAPILLARY: 132 mg/dL — AB (ref 65–99)
GLUCOSE-CAPILLARY: 125 mg/dL — AB (ref 65–99)
GLUCOSE-CAPILLARY: 141 mg/dL — AB (ref 65–99)
Glucose-Capillary: 108 mg/dL — ABNORMAL HIGH (ref 65–99)
Glucose-Capillary: 123 mg/dL — ABNORMAL HIGH (ref 65–99)
Glucose-Capillary: 135 mg/dL — ABNORMAL HIGH (ref 65–99)

## 2016-02-24 LAB — COMPREHENSIVE METABOLIC PANEL
ALT: 182 U/L — ABNORMAL HIGH (ref 14–54)
AST: 228 U/L — ABNORMAL HIGH (ref 15–41)
Albumin: 1.5 g/dL — ABNORMAL LOW (ref 3.5–5.0)
Alkaline Phosphatase: 67 U/L (ref 38–126)
Anion gap: 15 (ref 5–15)
BUN: 153 mg/dL — ABNORMAL HIGH (ref 6–20)
CO2: 27 mmol/L (ref 22–32)
Calcium: 8.9 mg/dL (ref 8.9–10.3)
Chloride: 114 mmol/L — ABNORMAL HIGH (ref 101–111)
Creatinine, Ser: 3.08 mg/dL — ABNORMAL HIGH (ref 0.44–1.00)
GFR calc Af Amer: 22 mL/min — ABNORMAL LOW (ref >60)
GFR calc non Af Amer: 19 mL/min — ABNORMAL LOW (ref >60)
Glucose, Bld: 146 mg/dL — ABNORMAL HIGH (ref 65–99)
Potassium: 3.6 mmol/L (ref 3.5–5.1)
Sodium: 156 mmol/L — ABNORMAL HIGH (ref 135–145)
Total Bilirubin: 25.1 mg/dL (ref 0.3–1.2)
Total Protein: 5.8 g/dL — ABNORMAL LOW (ref 6.5–8.1)

## 2016-02-24 LAB — CBC
HCT: 22.9 % — ABNORMAL LOW (ref 36.0–46.0)
Hemoglobin: 7.3 g/dL — ABNORMAL LOW (ref 12.0–15.0)
MCH: 29.6 pg (ref 26.0–34.0)
MCHC: 31.9 g/dL (ref 30.0–36.0)
MCV: 92.7 fL (ref 78.0–100.0)
Platelets: 301 10*3/uL (ref 150–400)
RBC: 2.47 MIL/uL — ABNORMAL LOW (ref 3.87–5.11)
RDW: 22.7 % — ABNORMAL HIGH (ref 11.5–15.5)
WBC: 25.3 10*3/uL — ABNORMAL HIGH (ref 4.0–10.5)

## 2016-02-24 MED ORDER — IPRATROPIUM-ALBUTEROL 0.5-2.5 (3) MG/3ML IN SOLN
3.0000 mL | Freq: Four times a day (QID) | RESPIRATORY_TRACT | Status: DC | PRN
  Administered 2016-02-29 – 2016-04-03 (×5): 3 mL via RESPIRATORY_TRACT
  Filled 2016-02-24 (×5): qty 3

## 2016-02-24 MED ORDER — POTASSIUM CHLORIDE 10 MEQ/50ML IV SOLN
10.0000 meq | INTRAVENOUS | Status: AC
  Administered 2016-02-24 (×3): 10 meq via INTRAVENOUS
  Filled 2016-02-24 (×3): qty 50

## 2016-02-24 NOTE — Progress Notes (Signed)
[facility] ([facility])  S/p ex-fix and transsacral screw fixation; DOI 02/09/16  Subjective: Intubated and sedated  Objective: Temp:  [97.5 F (36.4 C)-98.7 F (37.1 C)] 98.7 F (37.1 C) (04/18 0800) Pulse Rate:  [78-103] 87 (04/18 0900) Resp:  [14-26] 14 (04/18 0900) BP: (81-128)/(44-76) 123/59 mmHg (04/18 0900) SpO2:  [91 %-100 %] 91 % (04/18 1044) FiO2 (%):  [40 %] 40 % (04/18 0814) Weight:  [298 lb 1 oz (135.2 kg)] 298 lb 1 oz (135.2 kg) (04/18 0419) Physical Exam Pin sites look good considering continued drainage. Tendency to hold hips in abduction   Assessment/Plan: 6 Days Post-Op Procedure(s) (LRB): EXPLORATORY LAPAROTOMY, PLACEMENT OF ABRA ABDOMINAL WALL CLOSURE SET (N/A) GASTROSTOMY TUBE (N/A)  Continue pin site care; anterior ex-fix will be definitive; would love to get 2-4 more wks out of it New xrays next wk  Myrene GalasMichael Agamjot Kilgallon, MD [facility] [phone] (p)

## 2016-02-24 NOTE — Progress Notes (Signed)
[facility] Renal Progress Note  Requesting MD: Trauma Indication for Consultation: AKI  S: [name] Bell is a 35 y.o. female with obesity but no other known past medical history. She was admitted on 4/3 after she jumped from her fifth floor hotel room and also had ingested some drain cleaner.  Remains intubated.    BUN/Cr continues to rise. Cr 3.08/BUN 153 today.  Na climbing 156. Tbili 25.1.  Sitter remains at bedside.  Patient more alert today.  O:  Medications: Prior to Admission medications   Not on File    I have reviewed the patient's current medications.  Labs:  Results for orders placed or performed during the hospital encounter of 02/09/16 (from the past 24 hour(s))  Glucose, capillary     Status: Abnormal   Collection Time: 02/23/16  3:41 PM  Result Value Ref Range   Glucose-Capillary 127 (H) 65 - 99 mg/dL   Comment 1 Notify RN    Comment 2 Document in Chart   APTT     Status: None   Collection Time: 02/23/16  7:26 PM  Result Value Ref Range   aPTT 37 24 - 37 seconds  Glucose, capillary     Status: Abnormal   Collection Time: 02/23/16  7:47 PM  Result Value Ref Range   Glucose-Capillary 122 (H) 65 - 99 mg/dL  Basic metabolic panel     Status: Abnormal   Collection Time: 02/23/16  8:40 PM  Result Value Ref Range   Sodium 154 (H) 135 - 145 mmol/L   Potassium 3.5 3.5 - 5.1 mmol/L   Chloride 114 (H) 101 - 111 mmol/L   CO2 27 22 - 32 mmol/L   Glucose, Bld 148 (H) 65 - 99 mg/dL   BUN 161 (H) 6 - 20 mg/dL   Creatinine, Ser 0.96 (H) 0.44 - 1.00 mg/dL   Calcium 9.0 8.9 - 04.5 mg/dL   GFR calc non Af Amer 21 (L) >60 mL/min   GFR calc Af Amer 25 (L) >60 mL/min   Anion gap 13 5 - 15  Magnesium     Status: None   Collection Time: 02/23/16  8:40 PM  Result Value Ref Range   Magnesium 2.1 1.7 - 2.4 mg/dL  Phosphorus     Status: Abnormal   Collection Time: 02/23/16  8:40 PM  Result Value Ref Range   Phosphorus 5.6 (H) 2.5 - 4.6 mg/dL  Glucose,  capillary     Status: Abnormal   Collection Time: 02/23/16 11:17 PM  Result Value Ref Range   Glucose-Capillary 131 (H) 65 - 99 mg/dL  Glucose, capillary     Status: Abnormal   Collection Time: 02/24/16  3:26 AM  Result Value Ref Range   Glucose-Capillary 108 (H) 65 - 99 mg/dL  Blood gas, arterial     Status: Abnormal   Collection Time: 02/24/16  4:12 AM  Result Value Ref Range   FIO2 0.40    Delivery systems VENTILATOR    Mode PRESSURE REGULATED VOLUME CONTROL    VT 490 mL   LHR 20 resp/min   Peep/cpap 8.0 cm H20   pH, Arterial 7.393 7.350 - 7.450   pCO2 arterial 45.2 (H) 35.0 - 45.0 mmHg   pO2, Arterial 73.7 (L) 80.0 - 100.0 mmHg   Bicarbonate 27.0 (H) 20.0 - 24.0 mEq/L   TCO2 28.4 0 - 100 mmol/L   Acid-Base Excess 2.5 (H) 0.0 - 2.0 mmol/L   O2 Saturation 94.7 %   Patient temperature  98.6    Collection site LEFT RADIAL    Drawn by [id number]    Sample type ARTERIAL DRAW    Allens test (pass/fail) PASS PASS  Comprehensive metabolic panel     Status: Abnormal   Collection Time: 02/24/16  4:15 AM  Result Value Ref Range   Sodium 156 (H) 135 - 145 mmol/L   Potassium 3.6 3.5 - 5.1 mmol/L   Chloride 114 (H) 101 - 111 mmol/L   CO2 27 22 - 32 mmol/L   Glucose, Bld 146 (H) 65 - 99 mg/dL   BUN 045153 (H) 6 - 20 mg/dL   Creatinine, Ser 4.093.08 (H) 0.44 - 1.00 mg/dL   Calcium 8.9 8.9 - 81.110.3 mg/dL   Total Protein 5.8 (L) 6.5 - 8.1 g/dL   Albumin 1.5 (L) 3.5 - 5.0 g/dL   AST 914228 (H) 15 - 41 U/L   ALT 182 (H) 14 - 54 U/L   Alkaline Phosphatase 67 38 - 126 U/L   Total Bilirubin 25.1 (HH) 0.3 - 1.2 mg/dL   GFR calc non Af Amer 19 (L) >60 mL/min   GFR calc Af Amer 22 (L) >60 mL/min   Anion gap 15 5 - 15  CBC     Status: Abnormal   Collection Time: 02/24/16  4:15 AM  Result Value Ref Range   WBC 25.3 (H) 4.0 - 10.5 K/uL   RBC 2.47 (L) 3.87 - 5.11 MIL/uL   Hemoglobin 7.3 (L) 12.0 - 15.0 g/dL   HCT 78.222.9 (L) 95.636.0 - 21.346.0 %   MCV 92.7 78.0 - 100.0 fL   MCH 29.6 26.0 - 34.0 pg   MCHC 31.9  30.0 - 36.0 g/dL   RDW 08.622.7 (H) 57.811.5 - 46.915.5 %   Platelets 301 150 - 400 K/uL  Protime-INR     Status: Abnormal   Collection Time: 02/24/16  4:15 AM  Result Value Ref Range   Prothrombin Time 17.9 (H) 11.6 - 15.2 seconds   INR 1.47 0.00 - 1.49  Glucose, capillary     Status: Abnormal   Collection Time: 02/24/16  7:58 AM  Result Value Ref Range   Glucose-Capillary 123 (H) 65 - 99 mg/dL  Glucose, capillary     Status: Abnormal   Collection Time: 02/24/16 11:49 AM  Result Value Ref Range   Glucose-Capillary 135 (H) 65 - 99 mg/dL   Comment 1 Notify RN    Comment 2 Document in Chart    ROS:  Review of systems not obtained due to patient factors.  Physical Exam: Filed Vitals:   02/24/16 1146 02/24/16 1200  BP: 114/66 82/38  Pulse: 96 85  Temp:  99.2 F (37.3 C)  Resp: 23 20     General: Obese black female on vent, awake, sitter at bedside HEENT: MMM Heart: RRR on monitor Lungs: on vent Abdomen: wound vac in place GU: dark rust colored urine in Foley bag Extremities: Warm, 3+ pitting edema of LE Skin: Warm and dry Neuro: awakens to voice  Assessment/Plan: 35 year old black female with no medical history and normal renal function at baseline. She presents after a traumatic fall requiring multiple surgeries. She had acute kidney injury earlier in hospitalization in now appears to have recurrence of that 1.Renal- second episode of acute kidney injury this hospitalization. BP continues to be intermittently soft to 80/30s. Continues to have good UOP (3.9L off /24 hours).  BUN/Cr rising.  K stable. Will continue to monitor closely.  No indications for dialysis at this time.  CK 866. 2. Hypertension/volume  - she is massively volume overloaded. However, she is also freewater deficient.  BPs tolerating Lasix.  Will continue this. 3. Anemia  - situational, supportive care as needed 4. Hypernatremia- Increasing from yesterday. Na 156.  Continue free water through her oral tube.  Delynn Flavin, DO 02/24/2016, 1:18 PM

## 2016-02-24 NOTE — Progress Notes (Signed)
PATIENT ID: [id number] s/p suicine attempt via toxic ingestion and 5th floor hotel room s/p multiple exlaps now with open healing abdominal wound.  35s run of NSVT last night.  SUBJECTIVE: Intubated and sedated.    PHYSICAL EXAM Filed Vitals:   02/24/16 0814 02/24/16 0830 02/24/16 0900 02/24/16 1044  BP: 98/58  123/59   Pulse: 82  87   Temp:      TempSrc:      Resp: 20  14   Height:      Weight:      SpO2: 94% 95% 95% 91%   General:  Critically ill-appearing.  Intubated and sedated.  Neck:  Unable to assess JVD Lungs:  Vented breath sounds anteriorly Heart:  RRR.  No m/r/g. Normal S1/S2 Abdomen:  Soft.  Open midline wound with wound vac in place Extremities:  Anasarca. WWP.  LABS: No results found for: TROPONINI Results for orders placed or performed during the hospital encounter of 02/09/16 (from the past 24 hour(s))  Glucose, capillary     Status: Abnormal   Collection Time: 02/23/16 11:47 AM  Result Value Ref Range   Glucose-Capillary 102 (H) 65 - 99 mg/dL  Glucose, capillary     Status: Abnormal   Collection Time: 02/23/16  3:41 PM  Result Value Ref Range   Glucose-Capillary 127 (H) 65 - 99 mg/dL   Comment 1 Notify RN    Comment 2 Document in Chart   APTT     Status: None   Collection Time: 02/23/16  7:26 PM  Result Value Ref Range   aPTT 37 24 - 37 seconds  Glucose, capillary     Status: Abnormal   Collection Time: 02/23/16  7:47 PM  Result Value Ref Range   Glucose-Capillary 122 (H) 65 - 99 mg/dL  Basic metabolic panel     Status: Abnormal   Collection Time: 02/23/16  8:40 PM  Result Value Ref Range   Sodium 154 (H) 135 - 145 mmol/L   Potassium 3.5 3.5 - 5.1 mmol/L   Chloride 114 (H) 101 - 111 mmol/L   CO2 27 22 - 32 mmol/L   Glucose, Bld 148 (H) 65 - 99 mg/dL   BUN 130 (H) 6 - 20 mg/dL   Creatinine, Ser 8.65 (H) 0.44 - 1.00 mg/dL   Calcium 9.0 8.9 - 78.4 mg/dL   GFR calc non Af Amer 21 (L) >60 mL/min   GFR calc Af Amer 25 (L) >60 mL/min   Anion gap 13 5 -  15  Magnesium     Status: None   Collection Time: 02/23/16  8:40 PM  Result Value Ref Range   Magnesium 2.1 1.7 - 2.4 mg/dL  Phosphorus     Status: Abnormal   Collection Time: 02/23/16  8:40 PM  Result Value Ref Range   Phosphorus 5.6 (H) 2.5 - 4.6 mg/dL  Glucose, capillary     Status: Abnormal   Collection Time: 02/23/16 11:17 PM  Result Value Ref Range   Glucose-Capillary 131 (H) 65 - 99 mg/dL  Glucose, capillary     Status: Abnormal   Collection Time: 02/24/16  3:26 AM  Result Value Ref Range   Glucose-Capillary 108 (H) 65 - 99 mg/dL  Blood gas, arterial     Status: Abnormal   Collection Time: 02/24/16  4:12 AM  Result Value Ref Range   FIO2 0.40    Delivery systems VENTILATOR    Mode PRESSURE REGULATED VOLUME CONTROL    VT 490 mL  LHR 20 resp/min   Peep/cpap 8.0 cm H20   pH, Arterial 7.393 7.350 - 7.450   pCO2 arterial 45.2 (H) 35.0 - 45.0 mmHg   pO2, Arterial 73.7 (L) 80.0 - 100.0 mmHg   Bicarbonate 27.0 (H) 20.0 - 24.0 mEq/L   TCO2 28.4 0 - 100 mmol/L   Acid-Base Excess 2.5 (H) 0.0 - 2.0 mmol/L   O2 Saturation 94.7 %   Patient temperature 98.6    Collection site LEFT RADIAL    Drawn by [id number]    Sample type ARTERIAL DRAW    Allens test (pass/fail) PASS PASS  Comprehensive metabolic panel     Status: Abnormal   Collection Time: 02/24/16  4:15 AM  Result Value Ref Range   Sodium 156 (H) 135 - 145 mmol/L   Potassium 3.6 3.5 - 5.1 mmol/L   Chloride 114 (H) 101 - 111 mmol/L   CO2 27 22 - 32 mmol/L   Glucose, Bld 146 (H) 65 - 99 mg/dL   BUN 045 (H) 6 - 20 mg/dL   Creatinine, Ser 4.09 (H) 0.44 - 1.00 mg/dL   Calcium 8.9 8.9 - 81.1 mg/dL   Total Protein 5.8 (L) 6.5 - 8.1 g/dL   Albumin 1.5 (L) 3.5 - 5.0 g/dL   AST 914 (H) 15 - 41 U/L   ALT 182 (H) 14 - 54 U/L   Alkaline Phosphatase 67 38 - 126 U/L   Total Bilirubin 25.1 (HH) 0.3 - 1.2 mg/dL   GFR calc non Af Amer 19 (L) >60 mL/min   GFR calc Af Amer 22 (L) >60 mL/min   Anion gap 15 5 - 15  CBC     Status:  Abnormal   Collection Time: 02/24/16  4:15 AM  Result Value Ref Range   WBC 25.3 (H) 4.0 - 10.5 K/uL   RBC 2.47 (L) 3.87 - 5.11 MIL/uL   Hemoglobin 7.3 (L) 12.0 - 15.0 g/dL   HCT 78.2 (L) 95.6 - 21.3 %   MCV 92.7 78.0 - 100.0 fL   MCH 29.6 26.0 - 34.0 pg   MCHC 31.9 30.0 - 36.0 g/dL   RDW 08.6 (H) 57.8 - 46.9 %   Platelets 301 150 - 400 K/uL  Protime-INR     Status: Abnormal   Collection Time: 02/24/16  4:15 AM  Result Value Ref Range   Prothrombin Time 17.9 (H) 11.6 - 15.2 seconds   INR 1.47 0.00 - 1.49  Glucose, capillary     Status: Abnormal   Collection Time: 02/24/16  7:58 AM  Result Value Ref Range   Glucose-Capillary 123 (H) 65 - 99 mg/dL    Intake/Output Summary (Last 24 hours) at 02/24/16 1137 Last data filed at 02/24/16 1005  Gross per 24 hour  Intake 3432.33 ml  Output   3380 ml  Net  52.33 ml    Telemetry: 3 beats of NSVT this AM.  Otherwise none since last night.   ASSESSMENT AND PLAN:  Active Problems:   Fall from, out of or through building, not otherwise specified, initial encounter   Hypovolemic shock (HCC)   # Ventricular tachycardia: Ms. Eppard had a long run of VT last night.  Potassium was low-normal at the time.  Agree with maintaining K>4, Mg >2.  Will order echo to evaluate for systolic dysfunction.  She is massively volume overloaded, though this is likely due to volume resuscitation and blood transfusions rather than heart failure.  Will continue to monitor..    Ragina Fenter C. Duke Salvia,  MD, [facility] 02/24/2016 11:37 AM

## 2016-02-24 NOTE — Progress Notes (Signed)
Nutrition Follow-up  DOCUMENTATION CODES:   Obesity unspecified  INTERVENTION:   Continue: Pivot 1.5 @ 30 ml/hr 60 ml Prostat TID Provides: 1680 kcal, 157 grams protein, and 546 ml H2O.  Total free water: 2946 ml  NUTRITION DIAGNOSIS:   Increased nutrient needs related to wound healing, catabolic illness as evidenced by estimated needs. Ongoing.   GOAL:   Provide needs based on ASPEN/SCCM guidelines Met.   MONITOR:   Skin, I & O's, Vent status, Labs, TF tolerance  ASSESSMENT:   Pt from [place] with no past medical hx admitted after suicide attempt by ingestion of Drano, 2 bottles of acetaminophen, nyquil, and possibly 6 alprazolam pills and jumping from 5th floor balcony of hotel. 4/3 pt is s/p exp lap, hepatorraphy, SBR, cholecystectomy, preperitoneal pelvic packing, ex fix pelvis, R HPTX, Bil pulmonary contusions. Plan for repeat OR 4/5 for another possible SBR. Abdomen remains open.   Patient is currently intubated on ventilator support MV: 9.2 L/min Temp (24hrs), Avg:98.4 F (36.9 C), Min:97.5 F (36.4 C), Max:99.2 F (37.3 C)  4/7 s/p exp lap, abd VAC dressing change 4/7 Pivot 1.5 started @ 10 ml/hr 4/8 Pivot 1.5 increased to 20 ml/hr, weaned from all pressors 4/9 Pivot 1.5 reached goal of 30 ml/hr, prostat added 60 ml TID 4/10 repeat OR with washout, abdomen left open with negative pressure wound dressing (VAC), sacral screws 4/12 PEG, ABRA placed 4/14 screw fixation at S1/S2 4/17 Discussed lack of bm with PA, dulcolax added  Medications reviewed and include: dulcolax, lasix Free water: 400 ml every 4 hours = 2400 ml Labs reviewed: sodium elevated 156, AST/ALT 228/182 CBG's: 123-135 Weight remains up by 17 lb, pt is 37 L positive and on lasix VAC Output: 175 ml 4/17  Diet Order:  Diet NPO time specified  Skin:  Wound (see comment) (open abd with VAC and ABRA device, surgical incisions)  Last BM:  4/18  Height:   Ht Readings from Last 1 Encounters:   02/09/16 5' 11"  (1.803 m)    Weight:   Wt Readings from Last 1 Encounters:  02/24/16 298 lb 1 oz (135.2 kg)    Ideal Body Weight:  70.4 kg  BMI:  Body mass index is 41.59 kg/(m^2).  Estimated Nutritional Needs:   Kcal:  9292-4462  Protein:  >/= 140 grams  Fluid:  > 2 L/day  EDUCATION NEEDS:   No education needs identified at this time  [place], [place], [place] Pager [phone] After Hours Pager

## 2016-02-24 NOTE — Progress Notes (Signed)
CRITICAL VALUE ALERT  Critical value received:  Total Bilirubin: 25.1  Date of notification:  02/24/2016  Time of notification:  0508  Critical value read back:Yes.    Nurse who received alert:  Suzanne BoronScott Croft, RN  MD notified (1st page):  Dr. Janee Mornhompson  Time of first page:  0510  MD notified (2nd page):  Time of second page:  Responding MD:  Dr. Georg Ruddlehomspon  Time MD responded:  0510 No new orders

## 2016-02-24 NOTE — Progress Notes (Signed)
Follow up - Trauma and Critical Care  Patient Details:    [name] Bell is an 35 y.o. female.  Lines/tubes : Airway 7.5 mm (Active)  Secured at (cm) 24 cm 02/24/2016  8:14 AM  Measured From Lips 02/24/2016  8:14 AM  Secured Location Right 02/24/2016  8:14 AM  Secured By [employer] 02/24/2016  8:14 AM  Tube Holder Repositioned Yes 02/24/2016  8:14 AM  Cuff Pressure (cm H2O) 25 cm H2O 02/23/2016  7:51 PM  Site Condition Dry 02/24/2016  8:14 AM     PICC Triple Lumen 02/15/16 PICC Right Cephalic 55 cm 0 cm (Active)  Indication for Insertion or Continuance of Line Vasoactive infusions 02/23/2016  7:51 PM  Exposed Catheter (cm) 0 cm 02/15/2016  1:23 PM  Site Assessment Clean;Dry;Intact 02/23/2016  7:51 PM  Lumen #1 Status Infusing 02/23/2016  7:51 PM  Lumen #2 Status Flushed;Saline locked 02/23/2016  7:51 PM  Lumen #3 Status Flushed;Saline locked 02/23/2016  7:51 PM  Dressing Type Transparent 02/23/2016  7:51 PM  Dressing Status Clean;Dry;Intact;Antimicrobial disc in place 02/23/2016  7:51 PM  Line Care Connections checked and tightened 02/23/2016  7:51 PM  Line Adjustment (NICU/IV Team Only) No 02/15/2016  1:23 PM  Dressing Intervention Dressing changed;Antimicrobial disc changed 02/22/2016  2:00 PM  Dressing Change Due 02/29/16 02/23/2016  7:51 PM     Negative Pressure Wound Therapy Abdomen Mid (Active)  Last dressing change 02/23/16 02/23/2016  8:00 AM  Site / Wound Assessment Dressing in place / Unable to assess 02/24/2016  8:00 AM  Peri-wound Assessment Intact 02/24/2016  8:00 AM  Cycle Continuous 02/24/2016  8:00 AM  Target Pressure (mmHg) 125 02/24/2016  8:00 AM  Canister Changed No 02/23/2016  8:00 AM  Dressing Status Intact 02/24/2016  8:00 AM  Drainage Amount Scant 02/24/2016  8:00 AM  Drainage Description Serosanguineous 02/24/2016  8:00 AM  Output (mL) 175 mL 02/24/2016  5:13 AM     Gastrostomy/Enterostomy Gastrostomy 24 Fr. LUQ (Active)  Surrounding Skin Dry;Intact 02/24/2016  8:00 AM   Tube Status Patent;Other (Comment) 02/24/2016  8:00 AM  Dressing Status Clean;Dry;Intact 02/24/2016  8:00 AM  Output (mL) 100 mL 02/18/2016  6:20 PM     Urethral Catheter S. Bowman,RN Latex 16 Fr. (Active)  Indication for Insertion or Continuance of Catheter Aggressive IV diuresis 02/24/2016  8:00 AM  Site Assessment Intact;Swelling 02/24/2016  8:00 AM  Catheter Maintenance Bag below level of bladder;Catheter secured;Drainage bag/tubing not touching floor;Insertion date on drainage bag;No dependent loops;Seal intact 02/24/2016  8:00 AM  Collection Container Standard drainage bag 02/24/2016  8:00 AM  Securement Method Securing device (Describe) 02/24/2016  8:00 AM  Urinary Catheter Interventions Unclamped 02/24/2016  8:00 AM  Output (mL) 650 mL 02/24/2016  5:13 AM    Microbiology/Sepsis markers: Results for orders placed or performed during the hospital encounter of 02/09/16  MRSA PCR Screening     Status: None   Collection Time: 02/11/16 11:30 AM  Result Value Ref Range Status   MRSA by PCR NEGATIVE NEGATIVE Final    Comment:        The GeneXpert MRSA Assay (FDA approved for NASAL specimens only), is one component of a comprehensive MRSA colonization surveillance program. It is not intended to diagnose MRSA infection nor to guide or monitor treatment for MRSA infections.     Anti-infectives:  Anti-infectives    Start     Dose/Rate Route Frequency Ordered Stop   02/11/16 0600  piperacillin-tazobactam (ZOSYN) IVPB 3.375 g  Status:  Discontinued     3.375 g 100 mL/hr over 30 Minutes Intravenous 4 times per day 02/10/16 2214 02/22/16 0853   02/10/16 1730  piperacillin-tazobactam (ZOSYN) IVPB 3.375 g  Status:  Discontinued     3.375 g 12.5 mL/hr over 240 Minutes Intravenous 3 times per day 02/10/16 1727 02/10/16 2214      Best Practice/Protocols:  VTE Prophylaxis: Lovenox (prophylaxtic dose) and Mechanical GI Prophylaxis: Proton Pump Inhibitor Continous  Sedation  Consults: Treatment Team:  Myrene Galas, MD Annie Sable, MD Kerin Perna, MD [facility], MD    Events:  Subjective:    Overnight Issues: Patient had a run of V-Tach this AM.  Cardiology thinks that it is a combination of problems including electrolyte anomalities and possible contusion/blunt injury.  Although, she has not had previous episodes of arrhythmias.  She is currentlty shaking a lot and seems uncomfortable.  Objective:  Vital signs for last 24 hours: Temp:  [97.5 F (36.4 C)-98.7 F (37.1 C)] 98.4 F (36.9 C) (04/18 0400) Pulse Rate:  [78-103] 82 (04/18 0814) Resp:  [16-26] 20 (04/18 0814) BP: (81-128)/(44-76) 98/58 mmHg (04/18 0814) SpO2:  [92 %-100 %] 94 % (04/18 0814) FiO2 (%):  [40 %] 40 % (04/18 0814) Weight:  [135.2 kg (298 lb 1 oz)] 135.2 kg (298 lb 1 oz) (04/18 0419)  Hemodynamic parameters for last 24 hours:    Intake/Output from previous day: 04/17 0701 - 04/18 0700 In: 2902.3 [I.V.:982.3; NG/GT:1920] Out: 3930 [Urine:3755; Drains:175]  Intake/Output this shift: Total I/O In: 460 [I.V.:30; NG/GT:430] Out: -   Vent settings for last 24 hours: Vent Mode:  [-] PRVC FiO2 (%):  [40 %] 40 % Set Rate:  [20 bmp] 20 bmp Vt Set:  [490 mL] 490 mL PEEP:  [8 cmH20] 8 cmH20 Plateau Pressure:  [21 cmH20-27 cmH20] 21 cmH20  Physical Exam:  General: alert, no respiratory distress and but seems uncomfortable Neuro: alert, oriented and nonfocal exam Resp: clear to auscultation bilaterally CVS: regular rate and rhythm, S1, S2 normal, no murmur, click, rub or gallop and Had a run of true V-tach yesterday evening.  K+ was 3.6, has snot been replaced GI: Open abdomen with NPWD in place, not a lot of drainage. Extremities: edema 3+ and seems to be improving slightly.  Results for orders placed or performed during the hospital encounter of 02/09/16 (from the past 24 hour(s))  CK     Status: Abnormal   Collection Time: 02/23/16  11:00 AM  Result Value Ref Range   Total CK 866 (H) 38 - 234 U/L  Glucose, capillary     Status: Abnormal   Collection Time: 02/23/16 11:47 AM  Result Value Ref Range   Glucose-Capillary 102 (H) 65 - 99 mg/dL  Glucose, capillary     Status: Abnormal   Collection Time: 02/23/16  3:41 PM  Result Value Ref Range   Glucose-Capillary 127 (H) 65 - 99 mg/dL   Comment 1 Notify RN    Comment 2 Document in Chart   APTT     Status: None   Collection Time: 02/23/16  7:26 PM  Result Value Ref Range   aPTT 37 24 - 37 seconds  Glucose, capillary     Status: Abnormal   Collection Time: 02/23/16  7:47 PM  Result Value Ref Range   Glucose-Capillary 122 (H) 65 - 99 mg/dL  Basic metabolic panel     Status: Abnormal   Collection Time: 02/23/16  8:40 PM  Result Value  Ref Range   Sodium 154 (H) 135 - 145 mmol/L   Potassium 3.5 3.5 - 5.1 mmol/L   Chloride 114 (H) 101 - 111 mmol/L   CO2 27 22 - 32 mmol/L   Glucose, Bld 148 (H) 65 - 99 mg/dL   BUN 161 (H) 6 - 20 mg/dL   Creatinine, Ser 0.96 (H) 0.44 - 1.00 mg/dL   Calcium 9.0 8.9 - 04.5 mg/dL   GFR calc non Af Amer 21 (L) >60 mL/min   GFR calc Af Amer 25 (L) >60 mL/min   Anion gap 13 5 - 15  Magnesium     Status: None   Collection Time: 02/23/16  8:40 PM  Result Value Ref Range   Magnesium 2.1 1.7 - 2.4 mg/dL  Phosphorus     Status: Abnormal   Collection Time: 02/23/16  8:40 PM  Result Value Ref Range   Phosphorus 5.6 (H) 2.5 - 4.6 mg/dL  Glucose, capillary     Status: Abnormal   Collection Time: 02/23/16 11:17 PM  Result Value Ref Range   Glucose-Capillary 131 (H) 65 - 99 mg/dL  Glucose, capillary     Status: Abnormal   Collection Time: 02/24/16  3:26 AM  Result Value Ref Range   Glucose-Capillary 108 (H) 65 - 99 mg/dL  Blood gas, arterial     Status: Abnormal   Collection Time: 02/24/16  4:12 AM  Result Value Ref Range   FIO2 0.40    Delivery systems VENTILATOR    Mode PRESSURE REGULATED VOLUME CONTROL    VT 490 mL   LHR 20 resp/min    Peep/cpap 8.0 cm H20   pH, Arterial 7.393 7.350 - 7.450   pCO2 arterial 45.2 (H) 35.0 - 45.0 mmHg   pO2, Arterial 73.7 (L) 80.0 - 100.0 mmHg   Bicarbonate 27.0 (H) 20.0 - 24.0 mEq/L   TCO2 28.4 0 - 100 mmol/L   Acid-Base Excess 2.5 (H) 0.0 - 2.0 mmol/L   O2 Saturation 94.7 %   Patient temperature 98.6    Collection site LEFT RADIAL    Drawn by [id number]    Sample type ARTERIAL DRAW    Allens test (pass/fail) PASS PASS  Comprehensive metabolic panel     Status: Abnormal   Collection Time: 02/24/16  4:15 AM  Result Value Ref Range   Sodium 156 (H) 135 - 145 mmol/L   Potassium 3.6 3.5 - 5.1 mmol/L   Chloride 114 (H) 101 - 111 mmol/L   CO2 27 22 - 32 mmol/L   Glucose, Bld 146 (H) 65 - 99 mg/dL   BUN 914 (H) 6 - 20 mg/dL   Creatinine, Ser 7.82 (H) 0.44 - 1.00 mg/dL   Calcium 8.9 8.9 - 95.6 mg/dL   Total Protein 5.8 (L) 6.5 - 8.1 g/dL   Albumin 1.5 (L) 3.5 - 5.0 g/dL   AST 213 (H) 15 - 41 U/L   ALT 182 (H) 14 - 54 U/L   Alkaline Phosphatase 67 38 - 126 U/L   Total Bilirubin 25.1 (HH) 0.3 - 1.2 mg/dL   GFR calc non Af Amer 19 (L) >60 mL/min   GFR calc Af Amer 22 (L) >60 mL/min   Anion gap 15 5 - 15  CBC     Status: Abnormal   Collection Time: 02/24/16  4:15 AM  Result Value Ref Range   WBC 25.3 (H) 4.0 - 10.5 K/uL   RBC 2.47 (L) 3.87 - 5.11 MIL/uL   Hemoglobin 7.3 (L) 12.0 -  15.0 g/dL   HCT 40.922.9 (L) 81.136.0 - 91.446.0 %   MCV 92.7 78.0 - 100.0 fL   MCH 29.6 26.0 - 34.0 pg   MCHC 31.9 30.0 - 36.0 g/dL   RDW 78.222.7 (H) 95.611.5 - 21.315.5 %   Platelets 301 150 - 400 K/uL  Protime-INR     Status: Abnormal   Collection Time: 02/24/16  4:15 AM  Result Value Ref Range   Prothrombin Time 17.9 (H) 11.6 - 15.2 seconds   INR 1.47 0.00 - 1.49     Assessment/Plan:   NEURO  Altered Mental Status:  agitation   Plan: Weaning on the ventilator and getting a bit agitated.  PULM  Atelectasis/collapse (focal and right side with large effusion also)   Plan: thoracic surgery did not think the patient  should get a VATS at this time.  May need another chest tube in the future.  CARDIO  Ventricular Tachycardia (non-sustained and without hemodynamic compromise) and Sinus Tachycardia   Plan: Appreciate cardiology support and recommendations  RENAL  Actue Renal Failure (etiology unknown and urne output is great.  Her total body edema is better.)   Plan: Appreciate nephrology follow-up.  Currently they are not recommending dialysis although CRRT with removal of some fluid mya help her lungs and getting her abdomen closed.  She is negative on fluids on her own even without dialysis, and her K+ and CO2 are not out of control.  GI  Open abdomen with NPWD.  Tolerating tube feedings well.   Plan: CPM.  Wound adjustment tomorrow.  Possible closure on Friday.  ID  No known infectous sources.   Plan: CPM  HEME  Anemia acute blood loss anemia and anemia of critical illness) Leukocytosis (neutrophilia and improving)   Plan: No blood.  CPM  ENDO No specific issues   Plan: CPM  Global Issues  Biggest issues are open abdomen, renal failure and inability to wean off the ventilator.  VATS on needed.  May need another chest tube in the future.  Replace K+.  Check Mg    LOS: 15 days   Additional comments:I reviewed the patient's new clinical lab test results. cbc/bmet and I reviewed the patients new imaging test results. cxr  Critical Care Total Time*: 30 Minutes  Maleyah Evans 02/24/2016  *Care during the described time interval was provided by me and/or other providers on the critical care team.  I have reviewed this patient's available data, including medical history, events of note, physical examination and test results as part of my evaluation.    \

## 2016-02-25 ENCOUNTER — Inpatient Hospital Stay (HOSPITAL_COMMUNITY): Payer: Medicaid Other

## 2016-02-25 DIAGNOSIS — I472 Ventricular tachycardia: Secondary | ICD-10-CM

## 2016-02-25 DIAGNOSIS — R571 Hypovolemic shock: Secondary | ICD-10-CM

## 2016-02-25 LAB — GLUCOSE, CAPILLARY
GLUCOSE-CAPILLARY: 112 mg/dL — AB (ref 65–99)
GLUCOSE-CAPILLARY: 110 mg/dL — AB (ref 65–99)
GLUCOSE-CAPILLARY: 107 mg/dL — AB (ref 65–99)
GLUCOSE-CAPILLARY: 120 mg/dL — AB (ref 65–99)
GLUCOSE-CAPILLARY: 128 mg/dL — AB (ref 65–99)
Glucose-Capillary: 127 mg/dL — ABNORMAL HIGH (ref 65–99)

## 2016-02-25 LAB — BASIC METABOLIC PANEL
ANION GAP: 15 (ref 5–15)
BUN: 173 mg/dL — AB (ref 6–20)
CALCIUM: 8.9 mg/dL (ref 8.9–10.3)
CO2: 26 mmol/L (ref 22–32)
Chloride: 112 mmol/L — ABNORMAL HIGH (ref 101–111)
Creatinine, Ser: 3.01 mg/dL — ABNORMAL HIGH (ref 0.44–1.00)
GFR calc Af Amer: 23 mL/min — ABNORMAL LOW (ref >60)
GFR, EST NON AFRICAN AMERICAN: 19 mL/min — AB (ref >60)
GLUCOSE: 133 mg/dL — AB (ref 65–99)
Potassium: 3.3 mmol/L — ABNORMAL LOW (ref 3.5–5.1)
Sodium: 153 mmol/L — ABNORMAL HIGH (ref 135–145)

## 2016-02-25 LAB — CBC WITH DIFFERENTIAL/PLATELET
BASOS ABS: 0 10*3/uL (ref 0.0–0.1)
Basophils Relative: 0 %
EOS ABS: 0.2 10*3/uL (ref 0.0–0.7)
Eosinophils Relative: 1 %
HCT: 20.7 % — ABNORMAL LOW (ref 36.0–46.0)
Hemoglobin: 6.8 g/dL — CL (ref 12.0–15.0)
Lymphocytes Relative: 4 %
Lymphs Abs: 0.9 10*3/uL (ref 0.7–4.0)
MCH: 30.8 pg (ref 26.0–34.0)
MCHC: 32.9 g/dL (ref 30.0–36.0)
MCV: 93.7 fL (ref 78.0–100.0)
MONO ABS: 2 10*3/uL — AB (ref 0.1–1.0)
Monocytes Relative: 9 %
NEUTROS ABS: 19.3 10*3/uL — AB (ref 1.7–7.7)
Neutrophils Relative %: 86 %
PLATELETS: 316 10*3/uL (ref 150–400)
RBC: 2.21 MIL/uL — ABNORMAL LOW (ref 3.87–5.11)
RDW: 23.4 % — ABNORMAL HIGH (ref 11.5–15.5)
WBC: 22.4 10*3/uL — ABNORMAL HIGH (ref 4.0–10.5)

## 2016-02-25 LAB — MAGNESIUM: MAGNESIUM: 2.1 mg/dL (ref 1.7–2.4)

## 2016-02-25 LAB — ECHOCARDIOGRAM COMPLETE
HEIGHTINCHES: 71 in
Weight: 4811.32 oz

## 2016-02-25 LAB — CBC
HEMATOCRIT: 24.5 % — AB (ref 36.0–46.0)
Hemoglobin: 7.9 g/dL — ABNORMAL LOW (ref 12.0–15.0)
MCH: 30.5 pg (ref 26.0–34.0)
MCHC: 32.2 g/dL (ref 30.0–36.0)
MCV: 94.6 fL (ref 78.0–100.0)
PLATELETS: 307 10*3/uL (ref 150–400)
RBC: 2.59 MIL/uL — ABNORMAL LOW (ref 3.87–5.11)
RDW: 22.8 % — AB (ref 11.5–15.5)
WBC: 21.6 10*3/uL — AB (ref 4.0–10.5)

## 2016-02-25 LAB — PREPARE RBC (CROSSMATCH)

## 2016-02-25 LAB — POTASSIUM: POTASSIUM: 3.8 mmol/L (ref 3.5–5.1)

## 2016-02-25 MED ORDER — SODIUM CHLORIDE 0.9 % IV SOLN
1.0000 mg/h | INTRAVENOUS | Status: DC
  Filled 2016-02-25: qty 10

## 2016-02-25 MED ORDER — POTASSIUM CHLORIDE 10 MEQ/50ML IV SOLN
10.0000 meq | INTRAVENOUS | Status: AC
  Administered 2016-02-25 (×3): 10 meq via INTRAVENOUS
  Filled 2016-02-25 (×3): qty 50

## 2016-02-25 MED ORDER — SODIUM CHLORIDE 0.9 % IV SOLN
Freq: Once | INTRAVENOUS | Status: AC
  Administered 2016-02-25: 15:00:00 via INTRAVENOUS

## 2016-02-25 NOTE — Progress Notes (Signed)
[facility] ROUNDING NOTE   Subjective:   Interval History: [name] Bell being changed   Good urine output   Objective:  Vital signs in last 24 hours:  Temp:  [98.4 F (36.9 C)-99.2 F (37.3 C)] 98.8 F (37.1 C) (04/19 1315) Pulse Rate:  [63-97] 89 (04/19 1626) Resp:  [16-28] 21 (04/19 1626) BP: (77-132)/(36-78) 114/64 mmHg (04/19 1626) SpO2:  [84 %-100 %] 97 % (04/19 1626) FiO2 (%):  [40 %-65 %] 60 % (04/19 1626) Weight:  [136.4 kg (300 lb 11.3 oz)] 136.4 kg (300 lb 11.3 oz) (04/19 0336)  Weight change: 1.2 kg (2 lb 10.3 oz) Filed Weights   02/23/16 0354 02/24/16 0419 02/25/16 0336  Weight: 137.6 kg (303 lb 5.7 oz) 135.2 kg (298 lb 1 oz) 136.4 kg (300 lb 11.3 oz)    Intake/Output: I/O last 3 completed shifts: In: 4536.1 [I.V.:1056.1; NG/GT:3480] Out: 2633 [Urine:4440; Drains:540; Stool:1]   Intake/Output this shift:  Total I/O In: 2153 [I.V.:235; Blood:325; NG/GT:1443; IV Piggyback:150] Out: 3545 [Urine:1135; Drains:50]  CVS- RRR RS- CTA ABD- BS present soft non-distended EXT- no edema   Basic Metabolic Panel:  Recent Labs Lab 02/22/16 0415 02/23/16 0400 02/23/16 2040 02/24/16 0415 02/25/16 0327  NA 156* 156* 154* 156* 153*  K 3.8 3.7 3.5 3.6 3.3*  CL 116* 116* 114* 114* 112*  CO2 28 28 27 27 26   GLUCOSE 151* 123* 148* 146* 133*  BUN 106* 132* 150* 153* 173*  CREATININE 2.24* 2.72* 2.80* 3.08* 3.01*  CALCIUM 8.4* 8.9 9.0 8.9 8.9  MG  --   --  2.1  --   --   PHOS  --   --  5.6*  --   --     Liver Function Tests:  Recent Labs Lab 02/24/16 0415  AST 228*  ALT 182*  ALKPHOS 67  BILITOT 25.1*  PROT 5.8*  ALBUMIN 1.5*   No results for input(s): LIPASE, AMYLASE in the last 168 hours. No results for input(s): AMMONIA in the last 168 hours.  CBC:  Recent Labs Lab 02/19/16 0530  02/21/16 0500 02/22/16 0415 02/23/16 0400 02/24/16 0415 02/25/16 0327  WBC 34.5*  < > 35.5* 32.2* 28.5* 25.3* 22.4*  NEUTROABS 31.1*  --   --   --   --    --  19.3*  HGB 7.7*  < > 7.7* 7.5* 7.3* 7.3* 6.8*  HCT 24.8*  < > 24.5* 23.8* 22.5* 22.9* 20.7*  MCV 93.6  < > 92.8 93.3 93.8 92.7 93.7  PLT 121*  < > 222 249 265 301 316  < > = values in this interval not displayed.  Cardiac Enzymes:  Recent Labs Lab 02/23/16 1100  CKTOTAL 866*    BNP: Invalid input(s): POCBNP  CBG:  Recent Labs Lab 02/24/16 2314 02/25/16 0316 02/25/16 0746 02/25/16 1123 02/25/16 1617  GLUCAP 141* 127* 112* 110* 107*    Microbiology: Results for orders placed or performed during the hospital encounter of 02/09/16  MRSA PCR Screening     Status: None   Collection Time: 02/11/16 11:30 AM  Result Value Ref Range Status   MRSA by PCR NEGATIVE NEGATIVE Final    Comment:        The GeneXpert MRSA Assay (FDA approved for NASAL specimens only), is one component of a comprehensive MRSA colonization surveillance program. It is not intended to diagnose MRSA infection nor to guide or monitor treatment for MRSA infections.     Coagulation Studies:  Recent Labs  02/24/16 0415  LABPROT 17.9*  INR 1.47    Urinalysis: No results for input(s): COLORURINE, LABSPEC, PHURINE, GLUCOSEU, HGBUR, BILIRUBINUR, KETONESUR, PROTEINUR, UROBILINOGEN, NITRITE, LEUKOCYTESUR in the last 72 hours.  Invalid input(s): APPERANCEUR    Imaging: Dg Chest Port 1 View  02/25/2016  CLINICAL DATA:  Hypoxia EXAM: PORTABLE CHEST 1 VIEW COMPARISON:  February 24, 2016 FINDINGS: Endotracheal tube tip is 2.3 cm above the carina. Central catheter tip is at the cavoatrial junction. No pneumothorax. There is a persistent sizable right effusion with patchy areas of airspace consolidation and compressive atelectasis on the right. There is a smaller left pleural effusion with left lower lobe atelectatic change. Heart is mildly enlarged with pulmonary vascularity felt to be within normal limits. No adenopathy is evident by radiography. IMPRESSION: Tube and catheter positions as described  without pneumothorax. Sizable right effusion with areas of atelectasis and consolidation on the right, stable. Smaller left effusion with left lower lobe atelectasis. Stable cardiac silhouette. Overall appearance is stable compared to 1 day prior. Electronically Signed   By: Lowella Grip III M.D.   On: 02/25/2016 07:25   Dg Chest Port 1 View  02/24/2016  CLINICAL DATA:  Short of breath EXAM: PORTABLE CHEST 1 VIEW COMPARISON:  02/23/2016 FINDINGS: Endotracheal tube in good position. Right sided PICC tip in the SVC. Large right effusion unchanged. Compressive atelectasis right lung unchanged. Left lower lobe atelectasis and small left effusion unchanged. Negative for pulmonary edema. IMPRESSION: Large right pleural effusion and compressive atelectasis the right lung unchanged. Left lower lobe atelectasis unchanged Endotracheal tube remains in good position. Electronically Signed   By: Franchot Gallo M.D.   On: 02/24/2016 08:20     Medications:   . fentaNYL infusion INTRAVENOUS 220 mcg/hr (02/25/16 1500)   . antiseptic oral rinse  7 mL Mouth Rinse 10 times per day  . bisacodyl  10 mg Rectal Daily  . chlorhexidine gluconate (SAGE KIT)  15 mL Mouth Rinse BID  . diazepam  5 mg Oral 3 times per day  . escitalopram  10 mg Per Tube Daily  . feeding supplement (PIVOT 1.5 CAL)  1,000 mL Per Tube Q24H  . feeding supplement (PRO-STAT SUGAR FREE 64)  60 mL Per Tube TID  . free water  400 mL Per Tube Q4H  . furosemide  80 mg Intravenous Q12H  . insulin aspart  0-20 Units Subcutaneous 6 times per day  . lidocaine (PF)  30 mL Intradermal Once  . pantoprazole  40 mg Oral Daily   Or  . pantoprazole (PROTONIX) IV  40 mg Intravenous Daily  . sodium chloride flush  10-40 mL Intracatheter Q12H   fentaNYL, ipratropium-albuterol, LORazepam, ondansetron **OR** ondansetron (ZOFRAN) IV, sodium chloride flush  Assessment/ Plan:  Assessment/Plan: 35 year old black female with no medical history and normal renal  function at baseline. She presents after a traumatic fall requiring multiple surgeries. She had acute kidney injury earlier in hospitalization in now appears to have recurrence of that 1.Renal- second episode of acute kidney injury this hospitalization. BP continues to be intermittently soft to 80/30s. Continues to have good UOP (3.9L off /24 hours). BUN/Cr rising. K  Replace . Will continue to monitor closely. No indications for dialysis at this time. CK 866. 2. Hypertension/volume - she is massively volume overloaded. However, she is also freewater deficient. BPs tolerating Lasix. Will continue this. 3. Anemia - situational, supportive care as needed 4. Hypernatremia- Improved from yesterday. Na 153. Continue free water through her oral tube.    LOS:  [place] @TODAY @4 :31 PM

## 2016-02-25 NOTE — Progress Notes (Signed)
Patient ID: [name] Bell, female   DOB: Feb 27, 1981, 35 y.o.   MRN: [id number] VAC changed on ABRA, move done. Width down to 2cm at widest. Fentanyl and versed given for procedure. Patient examined and I agree with the assessment and plan  [name] Aqueelah Cotrell, MD, MPH, FACS Trauma: [phone] General Surgery: [phone]  02/25/2016 10:55 AM

## 2016-02-25 NOTE — Progress Notes (Signed)
CRITICAL VALUE ALERT  Critical value received:  Hgb 6.8  Date of notification:  02/25/16  Time of notification:  04:30  Critical value read back:Yes.    Nurse who received alert:  Edison NasutiScott D Kailah Pennel  MD notified (1st page):  Dr. Corliss Skainssuei  Time of first page:  04:30  MD notified (2nd page): N/A  Time of second page: N/A  Responding MD:  Dr. Corliss Skainssuei  Time MD responded:  04:35

## 2016-02-25 NOTE — Progress Notes (Signed)
Echocardiogram 2D Echocardiogram has been performed.  Nolon RodBrown, Tony 02/25/2016, 2:19 PM

## 2016-02-25 NOTE — Progress Notes (Addendum)
Patient ID: [name] Bell, female   DOB: [birth year], 35 y.o.   MRN: [id number] Follow up - Trauma Critical Care  Patient Details:    [name] Bell is an 35 y.o. female.  Lines/tubes : Airway 7.5 mm (Active)  Secured at (cm) 24 cm 02/25/2016  8:20 AM  Measured From Lips 02/25/2016  8:20 AM  Secured Location Left 02/25/2016  8:20 AM  Secured By [employer] 02/25/2016  8:20 AM  Tube Holder Repositioned Yes 02/25/2016  8:20 AM  Cuff Pressure (cm H2O) 25 cm H2O 02/24/2016  8:26 PM  Site Condition Dry 02/25/2016  3:25 AM     PICC Triple Lumen 02/15/16 PICC Right Cephalic 55 cm 0 cm (Active)  Indication for Insertion or Continuance of Line Vasoactive infusions 02/25/2016  8:00 AM  Exposed Catheter (cm) 0 cm 02/15/2016  1:23 PM  Site Assessment Clean;Dry;Intact 02/25/2016  8:00 AM  Lumen #1 Status Infusing 02/25/2016  8:00 AM  Lumen #2 Status Capped (Central line);Flushed 02/25/2016  8:00 AM  Lumen #3 Status Capped (Central line);Flushed 02/25/2016  8:00 AM  Dressing Type Transparent 02/25/2016  8:00 AM  Dressing Status Clean;Dry;Intact;Antimicrobial disc in place 02/25/2016  8:00 AM  Line Care Connections checked and tightened 02/25/2016  8:00 AM  Line Adjustment (NICU/IV Team Only) No 02/15/2016  1:23 PM  Dressing Intervention Dressing changed;Antimicrobial disc changed 02/22/2016  2:00 PM  Dressing Change Due 02/29/16 02/25/2016  8:00 AM     Negative Pressure Wound Therapy Abdomen Mid (Active)  Last dressing change 02/23/16 02/23/2016  8:00 AM  Site / Wound Assessment Dressing in place / Unable to assess 02/24/2016  8:00 PM  Peri-wound Assessment Intact 02/24/2016  8:00 PM  Cycle Continuous 02/24/2016  8:00 PM  Target Pressure (mmHg) 125 02/24/2016  8:00 PM  Canister Changed No 02/24/2016  8:00 PM  Dressing Status Intact 02/24/2016  8:00 PM  Drainage Amount Scant 02/24/2016  8:00 PM  Drainage Description Serosanguineous 02/24/2016  8:00 PM  Output (mL) 125 mL 02/25/2016  5:30 AM      Gastrostomy/Enterostomy Gastrostomy 24 Fr. LUQ (Active)  Surrounding Skin Dry;Intact 02/24/2016  8:00 PM  Tube Status Patent;Other (Comment) 02/24/2016  8:00 PM  Dressing Status Clean;Dry;Intact 02/24/2016  8:00 AM  Output (mL) 100 mL Bell/2017  6:20 PM     Urethral Catheter S. Bowman,RN Latex 16 Fr. (Active)  Indication for Insertion or Continuance of Catheter Aggressive IV diuresis 02/25/2016  8:00 AM  Site Assessment Intact;Swelling 02/24/2016  8:00 PM  Catheter Maintenance Bag below level of bladder;Catheter secured;Drainage bag/tubing not touching floor;Insertion date on drainage bag;No dependent loops;Seal intact 02/25/2016  8:00 AM  Collection Container Standard drainage bag 02/24/2016  8:00 PM  Securement Method Securing device (Describe) 02/24/2016  8:00 PM  Urinary Catheter Interventions Unclamped 02/24/2016  8:00 PM  Output (mL) 275 mL 02/25/2016  8:00 AM    Microbiology/Sepsis markers: Results for orders placed or performed during the hospital encounter of 02/09/16  MRSA PCR Screening     Status: None   Collection Time: 02/11/16 11:30 AM  Result Value Ref Range Status   MRSA by PCR NEGATIVE NEGATIVE Final    Comment:        The GeneXpert MRSA Assay (FDA approved for NASAL specimens only), is one component of a comprehensive MRSA colonization surveillance program. It is not intended to diagnose MRSA infection nor to guide or monitor treatment for MRSA infections.     Anti-infectives:  Anti-infectives    Start  Dose/Rate Route Frequency Ordered Stop   02/11/16 0600  piperacillin-tazobactam (ZOSYN) IVPB 3.375 g  Status:  Discontinued     3.375 g 100 mL/hr over 30 Minutes Intravenous 4 times per day 02/10/16 2214 02/22/16 0853   02/10/16 1730  piperacillin-tazobactam (ZOSYN) IVPB 3.375 g  Status:  Discontinued     3.375 g 12.5 mL/hr over 240 Minutes Intravenous 3 times per day 02/10/16 1727 02/10/16 2214      Best Practice/Protocols:  VTE Prophylaxis:  Mechanical Intermittent Sedation  Consults: Treatment Team:  Myrene Galas, MD Annie Sable, MD Kerin Perna, MD [facility], MD    Studies:CXR stable R effusion  Subjective:    Overnight Issues: no VT, BP soft  Objective:  Vital signs for last 24 hours: Temp:  [98.4 F (36.9 C)-99.2 F (37.3 C)] 98.4 F (36.9 C) (04/19 0800) Pulse Rate:  [72-107] 97 (04/19 0820) Resp:  [13-28] 28 (04/19 0820) BP: (77-132)/(38-78) 93/45 mmHg (04/19 0820) SpO2:  [90 %-97 %] 97 % (04/19 0820) FiO2 (%):  [40 %] 40 % (04/19 0820) Weight:  [136.4 kg (300 lb 11.3 oz)] 136.4 kg (300 lb 11.3 oz) (04/19 0336)  Hemodynamic parameters for last 24 hours:    Intake/Output from previous day: 04/18 0701 - 04/19 0700 In: 2616.1 [I.V.:696.1; NG/GT:1920] Out: 3126 [Urine:2760; Drains:365; Stool:1]  Intake/Output this shift: Total I/O In: 457.5 [I.V.:27.5; NG/GT:430] Out: 275 [Urine:275]  Vent settings for last 24 hours: Vent Mode:  [-] PRVC FiO2 (%):  [40 %] 40 % Set Rate:  [20 bmp] 20 bmp Vt Set:  [490 mL] 490 mL PEEP:  [5 cmH20] 5 cmH20 Plateau Pressure:  [23 cmH20-29 cmH20] 29 cmH20  Physical Exam:  General: on vent Neuro: arouses and F/C HEENT/Neck: ETT Resp: rhonchi bilaterally CVS: RRR GI: ABRA in place Extremities: edema 4+  Results for orders placed or performed during the hospital encounter of 02/09/16 (from the past 24 hour(s))  Glucose, capillary     Status: Abnormal   Collection Time: 02/24/16 11:49 AM  Result Value Ref Range   Glucose-Capillary 135 (H) 65 - 99 mg/dL   Comment 1 Notify RN    Comment 2 Document in Chart   Glucose, capillary     Status: Abnormal   Collection Time: 02/24/16  3:54 PM  Result Value Ref Range   Glucose-Capillary 132 (H) 65 - 99 mg/dL   Comment 1 Notify RN    Comment 2 Document in Chart   Glucose, capillary     Status: Abnormal   Collection Time: 02/24/16  7:40 PM  Result Value Ref Range   Glucose-Capillary 125 (H) 65  - 99 mg/dL  Glucose, capillary     Status: Abnormal   Collection Time: 02/24/16 11:14 PM  Result Value Ref Range   Glucose-Capillary 141 (H) 65 - 99 mg/dL  Glucose, capillary     Status: Abnormal   Collection Time: 02/25/16  3:16 AM  Result Value Ref Range   Glucose-Capillary 127 (H) 65 - 99 mg/dL  Basic metabolic panel     Status: Abnormal   Collection Time: 02/25/16  3:27 AM  Result Value Ref Range   Sodium 153 (H) 135 - 145 mmol/L   Potassium 3.3 (L) 3.5 - 5.1 mmol/L   Chloride 112 (H) 101 - 111 mmol/L   CO2 26 22 - 32 mmol/L   Glucose, Bld 133 (H) 65 - 99 mg/dL   BUN 409 (H) 6 - 20 mg/dL   Creatinine, Ser 8.11 (H) 0.44 -  1.00 mg/dL   Calcium 8.9 8.9 - 16.110.3 mg/dL   GFR calc non Af Amer 19 (L) >60 mL/min   GFR calc Af Amer 23 (L) >60 mL/min   Anion gap 15 5 - 15  CBC with Differential/Platelet     Status: Abnormal   Collection Time: 02/25/16  3:27 AM  Result Value Ref Range   WBC 22.4 (H) 4.0 - 10.5 K/uL   RBC 2.21 (L) 3.87 - 5.11 MIL/uL   Hemoglobin 6.8 (LL) 12.0 - 15.0 g/dL   HCT 09.620.7 (L) 04.536.0 - 40.946.0 %   MCV 93.7 78.0 - 100.0 fL   MCH 30.8 26.0 - 34.0 pg   MCHC 32.9 30.0 - 36.0 g/dL   RDW 81.123.4 (H) 91.411.5 - 78.215.5 %   Platelets 316 150 - 400 K/uL   Neutrophils Relative % 86 %   Lymphocytes Relative 4 %   Monocytes Relative 9 %   Eosinophils Relative 1 %   Basophils Relative 0 %   Neutro Abs 19.3 (H) 1.7 - 7.7 K/uL   Lymphs Abs 0.9 0.7 - 4.0 K/uL   Monocytes Absolute 2.0 (H) 0.1 - 1.0 K/uL   Eosinophils Absolute 0.2 0.0 - 0.7 K/uL   Basophils Absolute 0.0 0.0 - 0.1 K/uL   RBC Morphology TARGET CELLS    WBC Morphology MILD LEFT SHIFT (1-5% METAS, OCC MYELO, OCC BANDS)   Glucose, capillary     Status: Abnormal   Collection Time: 02/25/16  7:46 AM  Result Value Ref Range   Glucose-Capillary 112 (H) 65 - 99 mg/dL   Comment 1 Notify RN    Comment 2 Document in Chart     Assessment & Plan: Present on Admission:  . Fall from, out of or through building, not otherwise  specified, initial encounter . Hypovolemic shock (HCC)   LOS: 16 days   Additional comments:I reviewed the patient's new clinical lab test results. . 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 Carola FrostHandy. Plan ex fix as definitive treatment per Dr. Carola FrostHandy Hepatic failure - hemorrhagic shock plus ingestion of toxins, check LFTs in AM. Bili has been high. AKI - appreciate renal F/U, CRT hopefully at plateau ABL anemia - TF 1u PRBC now Hyperglycemia - SSI ID - afeb, WBC continues to trend down FEN - Hypernatremia improving with free water VTE - SCD's, stop Lovenox with low Hb - will re-visit restarting it if Hb stabilizes Dispo - ICU I spoke with her sister at the bedside Critical Care Total Time*: 6135 Minutes  [name] Domnick Chervenak, MD, MPH, FACS Trauma: [phone] General Surgery: [phone]  02/25/2016  *Care during the described time interval was provided by me. I have reviewed this patient's available data, including medical history, events of note, physical examination and test results as part of my evaluation.

## 2016-02-25 NOTE — Progress Notes (Signed)
7 Days Post-Op Procedure(s) (LRB): EXPLORATORY LAPAROTOMY, PLACEMENT OF ABRA ABDOMINAL WALL CLOSURE SET (N/A) GASTROSTOMY TUBE (N/A) Subjective: Right pleural effusion remains significant and serial chest x-rays This will have a intact on the patient's ability to wean from ventilator We'll plan placement of a large right chest tube in OR because of patient's multiple rib fractures, morbid obesity and to provide adequate anesthesia.  Objective: Vital signs in last 24 hours: Temp:  [98.4 F (36.9 C)-99.2 F (37.3 C)] 98.8 F (37.1 C) (04/19 1315) Pulse Rate:  [63-97] 86 (04/19 1540) Cardiac Rhythm:  [-] Normal sinus rhythm (04/19 0800) Resp:  [16-28] 21 (04/19 1540) BP: (77-132)/(36-78) 110/67 mmHg (04/19 1540) SpO2:  [84 %-100 %] 95 % (04/19 1540) FiO2 (%):  [40 %-65 %] 65 % (04/19 1100) Weight:  [300 lb 11.3 oz (136.4 kg)] 300 lb 11.3 oz (136.4 kg) (04/19 0336)  Hemodynamic parameters for last 24 hours:    Intake/Output from previous day: 04/18 0701 - 04/19 0700 In: 2616.1 [I.V.:696.1; NG/GT:1920] Out: 3126 [Urine:2760; Drains:365; Stool:1] Intake/Output this shift: Total I/O In: 2128 [I.V.:213; Blood:325; NG/GT:1440; IV Piggyback:150] Out: 835 [Urine:785; Drains:50]  Sedated on ventilator Diminished breath sounds on the right Serosanguineous drainage from old chest tube site  Lab Results:  Recent Labs  02/24/16 0415 02/25/16 0327  WBC 25.3* 22.4*  HGB 7.3* 6.8*  HCT 22.9* 20.7*  PLT 301 316   BMET:  Recent Labs  02/24/16 0415 02/25/16 0327  NA 156* 153*  K 3.6 3.3*  CL 114* 112*  CO2 27 26  GLUCOSE 146* 133*  BUN 153* 173*  CREATININE 3.08* 3.01*  CALCIUM 8.9 8.9    PT/INR:  Recent Labs  02/24/16 0415  LABPROT 17.9*  INR 1.47   ABG    Component Value Date/Time   PHART 7.393 02/24/2016 0412   HCO3 27.0* 02/24/2016 0412   TCO2 28.4 02/24/2016 0412   ACIDBASEDEF 2.0 02/13/2016 0411   O2SAT 94.7 02/24/2016 0412   CBG (last 3)   Recent  Labs  02/25/16 0316 02/25/16 0746 02/25/16 1123  GLUCAP 127* 112* 110*    Assessment/Plan: S/P Procedure(s) (LRB): EXPLORATORY LAPAROTOMY, PLACEMENT OF ABRA ABDOMINAL WALL CLOSURE SET (N/A) GASTROSTOMY TUBE (N/A) Transfuse to hemoglobin of 8.0 Plan to place right chest tube in the or tomorrow to drain large effusion   LOS: 16 days    Kathlee Nationseter Van Trigt III 02/25/2016

## 2016-02-25 NOTE — Progress Notes (Signed)
PATIENT ID: [id number] s/p suicine attempt via toxic ingestion and 5th floor hotel room s/p multiple exlaps now with open healing abdominal wound.  35s run of NSVT last night.  SUBJECTIVE: Intubated and sedated.    PHYSICAL EXAM Filed Vitals:   02/25/16 1115 02/25/16 1120 02/25/16 1140 02/25/16 1200  BP: 82/41 86/51 84/41    Pulse: 76 77 79   Temp:    98.7 F (37.1 C)  TempSrc:    Axillary  Resp: 20 20 20    Height:      Weight:      SpO2: 94% 94% 93%    General:  Critically ill-appearing.  Intubated and sedated.  Neck:  Unable to assess JVD Lungs:  Vented breath sounds anteriorly Heart:  RRR.  No m/r/g. Normal S1/S2 Abdomen:  Soft.  Open midline wound with wound vac in place Extremities:  Anasarca. WWP.  LABS: No results found for: TROPONINI Results for orders placed or performed during the hospital encounter of 02/09/16 (from the past 24 hour(s))  Glucose, capillary     Status: Abnormal   Collection Time: 02/24/16  3:54 PM  Result Value Ref Range   Glucose-Capillary 132 (H) 65 - 99 mg/dL   Comment 1 Notify RN    Comment 2 Document in Chart   Glucose, capillary     Status: Abnormal   Collection Time: 02/24/16  7:40 PM  Result Value Ref Range   Glucose-Capillary 125 (H) 65 - 99 mg/dL  Glucose, capillary     Status: Abnormal   Collection Time: 02/24/16 11:14 PM  Result Value Ref Range   Glucose-Capillary 141 (H) 65 - 99 mg/dL  Glucose, capillary     Status: Abnormal   Collection Time: 02/25/16  3:16 AM  Result Value Ref Range   Glucose-Capillary 127 (H) 65 - 99 mg/dL  Basic metabolic panel     Status: Abnormal   Collection Time: 02/25/16  3:27 AM  Result Value Ref Range   Sodium 153 (H) 135 - 145 mmol/L   Potassium 3.3 (L) 3.5 - 5.1 mmol/L   Chloride 112 (H) 101 - 111 mmol/L   CO2 26 22 - 32 mmol/L   Glucose, Bld 133 (H) 65 - 99 mg/dL   BUN 147173 (H) 6 - 20 mg/dL   Creatinine, Ser 8.293.01 (H) 0.44 - 1.00 mg/dL   Calcium 8.9 8.9 - 56.210.3 mg/dL   GFR calc non Af Amer 19 (L)  >60 mL/min   GFR calc Af Amer 23 (L) >60 mL/min   Anion gap 15 5 - 15  CBC with Differential/Platelet     Status: Abnormal   Collection Time: 02/25/16  3:27 AM  Result Value Ref Range   WBC 22.4 (H) 4.0 - 10.5 K/uL   RBC 2.21 (L) 3.87 - 5.11 MIL/uL   Hemoglobin 6.8 (LL) 12.0 - 15.0 g/dL   HCT 13.020.7 (L) 86.536.0 - 78.446.0 %   MCV 93.7 78.0 - 100.0 fL   MCH 30.8 26.0 - 34.0 pg   MCHC 32.9 30.0 - 36.0 g/dL   RDW 69.623.4 (H) 29.511.5 - 28.415.5 %   Platelets 316 150 - 400 K/uL   Neutrophils Relative % 86 %   Lymphocytes Relative 4 %   Monocytes Relative 9 %   Eosinophils Relative 1 %   Basophils Relative 0 %   Neutro Abs 19.3 (H) 1.7 - 7.7 K/uL   Lymphs Abs 0.9 0.7 - 4.0 K/uL   Monocytes Absolute 2.0 (H) 0.1 - 1.0 K/uL  Eosinophils Absolute 0.2 0.0 - 0.7 K/uL   Basophils Absolute 0.0 0.0 - 0.1 K/uL   RBC Morphology TARGET CELLS    WBC Morphology MILD LEFT SHIFT (1-5% METAS, OCC MYELO, OCC BANDS)   Glucose, capillary     Status: Abnormal   Collection Time: 02/25/16  7:46 AM  Result Value Ref Range   Glucose-Capillary 112 (H) 65 - 99 mg/dL   Comment 1 Notify RN    Comment 2 Document in Chart   Prepare RBC     Status: None   Collection Time: 02/25/16  9:35 AM  Result Value Ref Range   Order Confirmation ORDER PROCESSED BY BLOOD BANK   Type and screen [facility]     Status: None (Preliminary result)   Collection Time: 02/25/16  9:35 AM  Result Value Ref Range   ABO/RH(D) O POS    Antibody Screen NEG    Sample Expiration 02/28/2016    Unit Number [id number]    Blood Component Type RBC LR PHER2    Unit division 00    Status of Unit ALLOCATED    Transfusion Status OK TO TRANSFUSE    Crossmatch Result Compatible   Glucose, capillary     Status: Abnormal   Collection Time: 02/25/16 11:23 AM  Result Value Ref Range   Glucose-Capillary 110 (H) 65 - 99 mg/dL   Comment 1 Notify RN    Comment 2 Document in Chart     Intake/Output Summary (Last 24 hours) at 02/25/16  1239 Last data filed at 02/25/16 1200  Gross per 24 hour  Intake 3143.63 ml  Output   3061 ml  Net  82.63 ml    Telemetry: 3 beats of NSVT this AM.  Otherwise none since last night.   ASSESSMENT AND PLAN:  Active Problems:   Fall from, out of or through building, not otherwise specified, initial encounter   Hypovolemic shock (HCC)   # Ventricular tachycardia: Ms. Kehres had another 12 second episode of VT.  K 3.3 this AM and is being actively repleted.  Will check repeat K/Mg levels.  QTc is not prolonged.  Echo is being performed while I examined her.  It appears that her PICC line is in the RA and needs to be pulled back at least 4 cm.  The tip is near the TV in systole.  Given that the VT appears to coming from the RV, this should stop these episodes.  BP is soft, so we will not start beta blockers.  Given her hepatic dysfunction, avoid amiodarone or lidocaine.  No procainamide due to hypotension.    Ambrose Wile C. Duke Salvia, MD, [facility] 02/25/2016 12:39 PM

## 2016-02-26 ENCOUNTER — Inpatient Hospital Stay (HOSPITAL_COMMUNITY): Payer: Medicaid Other

## 2016-02-26 ENCOUNTER — Inpatient Hospital Stay (HOSPITAL_COMMUNITY): Payer: Medicaid Other | Admitting: Certified Registered"

## 2016-02-26 ENCOUNTER — Encounter (HOSPITAL_COMMUNITY): Admission: EM | Disposition: A | Discharge status: Rehabilitation Facility | Payer: Self-pay | Source: Home / Self Care

## 2016-02-26 DIAGNOSIS — L8990 Pressure ulcer of unspecified site, unspecified stage: Secondary | ICD-10-CM | POA: Diagnosis not present

## 2016-02-26 DIAGNOSIS — J90 Pleural effusion, not elsewhere classified: Secondary | ICD-10-CM

## 2016-02-26 HISTORY — PX: CHEST TUBE INSERTION: SHX231

## 2016-02-26 LAB — BASIC METABOLIC PANEL
Anion gap: 16 — ABNORMAL HIGH (ref 5–15)
BUN: 184 mg/dL — ABNORMAL HIGH (ref 6–20)
CALCIUM: 9.1 mg/dL (ref 8.9–10.3)
CHLORIDE: 112 mmol/L — AB (ref 101–111)
CO2: 25 mmol/L (ref 22–32)
CREATININE: 2.96 mg/dL — AB (ref 0.44–1.00)
GFR calc non Af Amer: 20 mL/min — ABNORMAL LOW (ref >60)
GFR, EST AFRICAN AMERICAN: 23 mL/min — AB (ref >60)
Glucose, Bld: 147 mg/dL — ABNORMAL HIGH (ref 65–99)
Potassium: 3.3 mmol/L — ABNORMAL LOW (ref 3.5–5.1)
SODIUM: 153 mmol/L — AB (ref 135–145)

## 2016-02-26 LAB — HEPATIC FUNCTION PANEL
ALBUMIN: 1.6 g/dL — AB (ref 3.5–5.0)
ALK PHOS: 91 U/L (ref 38–126)
ALT: 186 U/L — ABNORMAL HIGH (ref 14–54)
AST: 215 U/L — ABNORMAL HIGH (ref 15–41)
Bilirubin, Direct: 13.6 mg/dL — ABNORMAL HIGH (ref 0.1–0.5)
Indirect Bilirubin: 11.9 mg/dL — ABNORMAL HIGH (ref 0.3–0.9)
TOTAL PROTEIN: 6.2 g/dL — AB (ref 6.5–8.1)
Total Bilirubin: 25.5 mg/dL (ref 0.3–1.2)

## 2016-02-26 LAB — TYPE AND SCREEN
ABO/RH(D): O POS
ANTIBODY SCREEN: NEGATIVE
Unit division: 0
Unit division: 0

## 2016-02-26 LAB — CBC
HCT: 26.5 % — ABNORMAL LOW (ref 36.0–46.0)
HEMOGLOBIN: 8.5 g/dL — AB (ref 12.0–15.0)
MCH: 29.2 pg (ref 26.0–34.0)
MCHC: 32.1 g/dL (ref 30.0–36.0)
MCV: 91.1 fL (ref 78.0–100.0)
Platelets: 322 10*3/uL (ref 150–400)
RBC: 2.91 MIL/uL — ABNORMAL LOW (ref 3.87–5.11)
RDW: 22.3 % — ABNORMAL HIGH (ref 11.5–15.5)
WBC: 19.1 10*3/uL — AB (ref 4.0–10.5)

## 2016-02-26 LAB — GLUCOSE, CAPILLARY
GLUCOSE-CAPILLARY: 129 mg/dL — AB (ref 65–99)
GLUCOSE-CAPILLARY: 113 mg/dL — AB (ref 65–99)
GLUCOSE-CAPILLARY: 111 mg/dL — AB (ref 65–99)
Glucose-Capillary: 123 mg/dL — ABNORMAL HIGH (ref 65–99)
Glucose-Capillary: 108 mg/dL — ABNORMAL HIGH (ref 65–99)

## 2016-02-26 LAB — MAGNESIUM: MAGNESIUM: 2.2 mg/dL (ref 1.7–2.4)

## 2016-02-26 SURGERY — CHEST TUBE INSERTION
Anesthesia: General | Laterality: Right

## 2016-02-26 MED ORDER — POTASSIUM CHLORIDE 10 MEQ/50ML IV SOLN
10.0000 meq | INTRAVENOUS | Status: AC
  Administered 2016-02-26 (×2): 10 meq via INTRAVENOUS
  Filled 2016-02-26 (×2): qty 50

## 2016-02-26 MED ORDER — MIDAZOLAM HCL 2 MG/2ML IJ SOLN
INTRAMUSCULAR | Status: AC
  Filled 2016-02-26: qty 2

## 2016-02-26 MED ORDER — 0.9 % SODIUM CHLORIDE (POUR BTL) OPTIME
TOPICAL | Status: DC | PRN
  Administered 2016-02-26: 1000 mL

## 2016-02-26 MED ORDER — CEFAZOLIN SODIUM-DEXTROSE 2-3 GM-% IV SOLR
INTRAVENOUS | Status: DC | PRN
  Administered 2016-02-26: 2 g via INTRAVENOUS

## 2016-02-26 MED ORDER — PHENYLEPHRINE HCL 10 MG/ML IJ SOLN
INTRAMUSCULAR | Status: DC | PRN
  Administered 2016-02-26: 40 ug via INTRAVENOUS
  Administered 2016-02-26: 120 ug via INTRAVENOUS
  Administered 2016-02-26: 40 ug via INTRAVENOUS
  Administered 2016-02-26 (×4): 80 ug via INTRAVENOUS

## 2016-02-26 MED ORDER — ROCURONIUM BROMIDE 50 MG/5ML IV SOLN
INTRAVENOUS | Status: AC
  Filled 2016-02-26: qty 1

## 2016-02-26 MED ORDER — PROPOFOL 10 MG/ML IV BOLUS
INTRAVENOUS | Status: AC
  Filled 2016-02-26: qty 20

## 2016-02-26 MED ORDER — ROCURONIUM BROMIDE 100 MG/10ML IV SOLN
INTRAVENOUS | Status: DC | PRN
  Administered 2016-02-26: 30 mg via INTRAVENOUS
  Administered 2016-02-26: 20 mg via INTRAVENOUS

## 2016-02-26 MED ORDER — DEXTROSE 5 % IV SOLN
10.0000 mg | INTRAVENOUS | Status: DC | PRN
  Administered 2016-02-26: 20 ug/min via INTRAVENOUS

## 2016-02-26 MED ORDER — LACTATED RINGERS IV SOLN
INTRAVENOUS | Status: DC | PRN
  Administered 2016-02-26: 13:00:00 via INTRAVENOUS

## 2016-02-26 MED ORDER — MIDAZOLAM HCL 2 MG/2ML IJ SOLN
INTRAMUSCULAR | Status: DC | PRN
  Administered 2016-02-26 (×2): 2 mg via INTRAVENOUS

## 2016-02-26 SURGICAL SUPPLY — 34 items
CANISTER SUCT 3000ML PPV (MISCELLANEOUS) ×3 IMPLANT
CATH THORACIC 28FR (CATHETERS) IMPLANT
CATH THORACIC 36FR (CATHETERS) IMPLANT
COVER SURGICAL LIGHT HANDLE (MISCELLANEOUS) ×3 IMPLANT
COVER TABLE BACK 60X90 (DRAPES) ×3 IMPLANT
DRAPE CHEST BREAST 15X10 FENES (DRAPES) ×3 IMPLANT
DRSG AQUACEL AG ADV 3.5X 4 (GAUZE/BANDAGES/DRESSINGS) ×3 IMPLANT
GAUZE SPONGE 4X4 12PLY STRL (GAUZE/BANDAGES/DRESSINGS) ×3 IMPLANT
GAUZE SPONGE 4X4 16PLY XRAY LF (GAUZE/BANDAGES/DRESSINGS) ×3 IMPLANT
GAUZE XEROFORM 1X8 LF (GAUZE/BANDAGES/DRESSINGS) ×6 IMPLANT
GLOVE BIO SURGEON STRL SZ7.5 (GLOVE) ×3 IMPLANT
GLOVE BIOGEL PI IND STRL 6 (GLOVE) ×1 IMPLANT
GLOVE BIOGEL PI INDICATOR 6 (GLOVE) ×2
GOWN STRL REUS W/ TWL LRG LVL3 (GOWN DISPOSABLE) ×2 IMPLANT
GOWN STRL REUS W/TWL LRG LVL3 (GOWN DISPOSABLE) ×4
KIT BASIN OR (CUSTOM PROCEDURE TRAY) ×3 IMPLANT
KIT ROOM TURNOVER OR (KITS) ×3 IMPLANT
PAD ARMBOARD 7.5X6 YLW CONV (MISCELLANEOUS) ×6 IMPLANT
SUT SILK  1 MH (SUTURE) ×2
SUT SILK 1 MH (SUTURE) ×1 IMPLANT
SUT VIC AB 3-0 SH 8-18 (SUTURE) ×6 IMPLANT
SWAB COLLECTION DEVICE MRSA (MISCELLANEOUS) ×3 IMPLANT
SWAB CULTURE ESWAB REG 1ML (MISCELLANEOUS) ×3 IMPLANT
SYR BULB IRRIGATION 50ML (SYRINGE) ×3 IMPLANT
SYR CONTROL 10ML LL (SYRINGE) ×3 IMPLANT
SYSTEM SAHARA CHEST DRAIN RE-I (WOUND CARE) ×3 IMPLANT
TAPE CLOTH SURG 6X10 WHT LF (GAUZE/BANDAGES/DRESSINGS) ×3 IMPLANT
TOWEL OR 17X26 10 PK STRL BLUE (TOWEL DISPOSABLE) ×3 IMPLANT
TRAP SPECIMEN MUCOUS 40CC (MISCELLANEOUS) IMPLANT
TRAY CHEST TUBE INSERTION (SET/KITS/TRAYS/PACK) ×3 IMPLANT
TUBE CONNECTING 12'X1/4 (SUCTIONS) ×1
TUBE CONNECTING 12X1/4 (SUCTIONS) ×2 IMPLANT
TUBE CONNECTING 20'X1/4 (TUBING) ×1
TUBE CONNECTING 20X1/4 (TUBING) ×2 IMPLANT

## 2016-02-26 NOTE — Anesthesia Preprocedure Evaluation (Signed)
Anesthesia Evaluation  Patient identified by MRN, date of birth, ID band Patient unresponsive    Reviewed: Allergy & Precautions, H&P , NPO status , Patient's Chart, lab work & pertinent test results  Airway Mallampati: Intubated       Dental no notable dental hx. (+) Teeth Intact, Dental Advisory Given   Pulmonary  Intubated on vent   Pulmonary exam normal breath sounds clear to auscultation       Cardiovascular negative cardio ROS   Rhythm:Regular Rate:Normal     Neuro/Psych negative neurological ROS  negative psych ROS   GI/Hepatic negative GI ROS, Neg liver ROS,   Endo/Other  negative endocrine ROSMorbid obesity  Renal/GU negative Renal ROS  negative genitourinary   Musculoskeletal   Abdominal   Peds  Hematology negative hematology ROS (+)   Anesthesia Other Findings   Reproductive/Obstetrics negative OB ROS                             Anesthesia Physical Anesthesia Plan  ASA: IV  Anesthesia Plan: General   Post-op Pain Management:    Induction: Intravenous  Airway Management Planned: Oral ETT  Additional Equipment:   Intra-op Plan:   Post-operative Plan: Post-operative intubation/ventilation  Informed Consent: I have reviewed the patients History and Physical, chart, labs and discussed the procedure including the risks, benefits and alternatives for the proposed anesthesia with the patient or authorized representative who has indicated his/her understanding and acceptance.   Dental advisory given  Plan Discussed with: CRNA  Anesthesia Plan Comments:         Anesthesia Quick Evaluation

## 2016-02-26 NOTE — Progress Notes (Signed)
The patient was examined and preop studies reviewed. There has been no change from the prior exam and the patient is ready for surgery.  Plan Right chest tube placement on [name] Bell

## 2016-02-26 NOTE — Progress Notes (Signed)
No recurrent VT after pulling PICC line back.  Will sign off.  Please call if there are additional questions or concerns.  Garrus Gauthreaux C. Duke Salviaandolph, MD, [facility]  02/26/2016  12:31 PM

## 2016-02-26 NOTE — Progress Notes (Signed)
CRITICAL VALUE ALERT  Critical value received: Total Billi 25.5 Date of notification: 02/26/15  Time of notification: 0632  Critical value read back:Yes.    Nurse who received alert:  Dicie BeamEllie Landry Lookingbill RN  MD notified (1st page): Megan MansJ. [name] MD  Time of first page:  [phone]   MD notified (2nd page):  Time of second page:  Responding MD:  [id number]  Time MD responded:  Megan MansJ. [name] MD  No new orders received. Dicie BeamFrazier, Aven Christen RN BSN

## 2016-02-26 NOTE — Brief Op Note (Signed)
02/09/2016 - 02/26/2016  2:13 PM  PATIENT:  Phylliss BobKatia D Constantin  35 y.o. female  PRE-OPERATIVE DIAGNOSIS:  RIGHT EFFUSION  POST-OPERATIVE DIAGNOSIS:  RIGHT EFFUSION  PROCEDURE:  Procedure(s): CHEST TUBE INSERTION (Right) 36 F Drainage of R pleural effusion ( bloody) 700cc SURGEON:  Surgeon(s) and Role:    * Kerin PernaPeter Van Trigt, MD - Primary  PHYSICIAN ASSISTANT: none  ASSISTANTS: none   ANESTHESIA:   general  EBL:  Total I/O In: 375 [I.V.:275; IV Piggyback:100] Out: 705 [Urine:700; Blood:5]  BLOOD ADMINISTERED:none  DRAINS: 1 Chest Tube(s) in the right pleural space   LOCAL MEDICATIONS USED:  NONE  SPECIMEN:  Aspirate  DISPOSITION OF SPECIMEN:  microbiology  COUNTS:  YES  TOURNIQUET:  * No tourniquets in log *  DICTATION: .Dragon Dictation  PLAN OF CARE: return to 3 C  PATIENT DISPOSITION:  ICU - intubated and hemodynamically stable.   Delay start of Pharmacological VTE agent (>24hrs) due to surgical blood loss or risk of bleeding: yes

## 2016-02-26 NOTE — Progress Notes (Signed)
"  The Move" (abdominal ) done with M.Jeffery PA for 30 sec times three. Pre procedure fentanyl bolus given.

## 2016-02-26 NOTE — Progress Notes (Signed)
Return to room

## 2016-02-26 NOTE — Transfer of Care (Signed)
Immediate Anesthesia Transfer of Care Note  Patient: [name] Bell  Procedure(s) Performed: Procedure(s): CHEST TUBE INSERTION (Right)  Patient Location: ICU  Anesthesia Type:General  Level of Consciousness: sedated and Patient remains intubated per anesthesia plan  Airway & Oxygen Therapy: Patient remains intubated per anesthesia plan and Patient placed on Ventilator (see vital sign flow sheet for setting)  Post-op Assessment: Report given to RN and Post -op Vital signs reviewed and stable  Post vital signs: Reviewed and stable  Last Vitals:  Filed Vitals:   02/26/16 1147 02/26/16 1148  BP: 97/56   Pulse: 75   Temp:  36.8 C  Resp:      Complications: No apparent anesthesia complications   Pt VSS during transport to ICU. Pt met my ICU RN and RT. Report given to RN/RT and all questions answered. Pt connected to ICU monitors and continued stability confirmed.

## 2016-02-26 NOTE — Clinical Social Work Note (Signed)
Clinical Social Worker continuing to follow patient and family for support.  Patient remains on the ventilator with plans to go to the OR today for chest tube placement.  Per RN, possible abdomen closure on Monday, pending patient progress.  CSW remains available for support and will follow up with psych CSW once patient is able to come off the ventilator and participate in programming.   [name] Bell, [place] [phone]

## 2016-02-26 NOTE — Progress Notes (Signed)
[facility] Progress Note  Subjective  Intubated/vent   Review of Systems  Unable to perform ROS: intubated     Objective   BP 94/55 mmHg  Pulse 76  Temp(Src) 98.4 F (36.9 C) (Axillary)  Resp 20  Ht 5\' 11"  (1.803 m)  Wt 134.5 kg (296 lb 8.3 oz)  BMI 41.37 kg/m2  SpO2 100%  LMP  (LMP Unknown)  Intake/Output      04/19 0701 - 04/20 0700 04/20 0701 - 04/21 0700   I.V. (mL/kg) 545.6 (4.1)    Blood 695.6    NG/GT 3120    IV Piggyback 150    Total Intake(mL/kg) 4511.1 (33.5)    Urine (mL/kg/hr) 3435 (1.1) 450 (1.3)   Drains 310 (0.1)    Stool 0 (0)    Total Output 3745 450   Net +766.1 -450        Stool Occurrence 5 x      Exam  Gen: intubated and sedated  Pelvis: pinsites are stable, no purulence. New kerlix applied around pinsites   Toes 3-5 R foot somewhat dark in appearance and look different from remaining toes  + swelling to R foot     Assessment and Plan   Complex pelvic ring fracture, s/p SI screw fixation and anterior pelvic ex fix  Ex fix will be definitive  pinsites look good, no purulence   Hopeful for another 2-4 weeks in fixator   F/u xrays next week  Daily pinsite care   Re-eval R foot as well      Mearl LatinKeith W. Faisal Stradling, PA-C [facility] [phone](P) [phone] (O) 02/26/2016 9:35 AM
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
## 2016-02-26 NOTE — Progress Notes (Signed)
[facility] ROUNDING NOTE   Subjective:   Interval History: continues to have good urine output (3.4L off in 24 hours).  Sedated.  Objective:  Vital signs in last 24 hours:  Temp:  [97.8 F (36.6 C)-98.8 F (37.1 C)] 98.4 F (36.9 C) (04/20 0745) Pulse Rate:  [67-97] 75 (04/20 1147) Resp:  [9-28] 20 (04/20 1100) BP: (76-126)/(41-74) 97/56 mmHg (04/20 1147) SpO2:  [92 %-100 %] 100 % (04/20 1100) FiO2 (%):  [50 %-60 %] 50 % (04/20 1148) Weight:  [296 lb 8.3 oz (134.5 kg)] 296 lb 8.3 oz (134.5 kg) (04/20 0500)  Weight change: -4 lb 3 oz (-1.9 kg) Filed Weights   02/24/16 0419 02/25/16 0336 02/26/16 0500  Weight: 298 lb 1 oz (135.2 kg) 300 lb 11.3 oz (136.4 kg) 296 lb 8.3 oz (134.5 kg)    Intake/Output: I/O last 3 completed shifts: In: 5972.3 [I.V.:876.7; Blood:695.6; NG/GT:4250; IV Piggyback:150] Out: 6720 [NOBSJ:6283; Drains:435; Stool:1]   Intake/Output this shift:  Total I/O In: 175 [I.V.:75; IV Piggyback:100] Out: 700 [Urine:700]  Gen: sedated, sitter at bedside CVS- RRR RS- intubated ABD- wound vac in place GU- foley catheter in place draining rust colored urine, appears clearer that previous days EXT- 3+ LE edema   Basic Metabolic Panel:  Recent Labs Lab 02/23/16 0400 02/23/16 2040 02/24/16 0415 02/25/16 0327 02/25/16 1600 02/26/16 0349  NA 156* 154* 156* 153*  --  153*  K 3.7 3.5 3.6 3.3* 3.8 3.3*  CL 116* 114* 114* 112*  --  112*  CO2 28 27 27 26   --  25  GLUCOSE 123* 148* 146* 133*  --  147*  BUN 132* 150* 153* 173*  --  184*  CREATININE 2.72* 2.80* 3.08* 3.01*  --  2.96*  CALCIUM 8.9 9.0 8.9 8.9  --  9.1  MG  --  2.1  --   --  2.1 2.2  PHOS  --  5.6*  --   --   --   --     Liver Function Tests:  Recent Labs Lab 02/24/16 0415 02/26/16 0349  AST 228* 215*  ALT 182* 186*  ALKPHOS 67 91  BILITOT 25.1* 25.5*  PROT 5.8* 6.2*  ALBUMIN 1.5* 1.6*   No results for input(s): LIPASE, AMYLASE in the last 168 hours. No results  for input(s): AMMONIA in the last 168 hours.  CBC:  Recent Labs Lab 02/23/16 0400 02/24/16 0415 02/25/16 0327 02/25/16 1817 02/26/16 0349  WBC 28.5* 25.3* 22.4* 21.6* 19.1*  NEUTROABS  --   --  19.3*  --   --   HGB 7.3* 7.3* 6.8* 7.9* 8.5*  HCT 22.5* 22.9* 20.7* 24.5* 26.5*  MCV 93.8 92.7 93.7 94.6 91.1  PLT 265 301 316 307 322    Cardiac Enzymes:  Recent Labs Lab 02/23/16 1100  CKTOTAL 866*    BNP: Invalid input(s): POCBNP  CBG:  Recent Labs Lab 02/25/16 1617 02/25/16 2029 02/25/16 2329 02/26/16 0318 02/26/16 0741  GLUCAP 107* 120* 128* 129* 123*    Microbiology: Results for orders placed or performed during the hospital encounter of 02/09/16  MRSA PCR Screening     Status: None   Collection Time: 02/11/16 11:30 AM  Result Value Ref Range Status   MRSA by PCR NEGATIVE NEGATIVE Final    Comment:        The GeneXpert MRSA Assay (FDA approved for NASAL specimens only), is one component of a comprehensive MRSA colonization surveillance program. It is not intended to diagnose  MRSA infection nor to guide or monitor treatment for MRSA infections.     Coagulation Studies:  Recent Labs  02/24/16 0415  LABPROT 17.9*  INR 1.47    Urinalysis: No results for input(s): COLORURINE, LABSPEC, PHURINE, GLUCOSEU, HGBUR, BILIRUBINUR, KETONESUR, PROTEINUR, UROBILINOGEN, NITRITE, LEUKOCYTESUR in the last 72 hours.  Invalid input(s): APPERANCEUR    Imaging: Dg Chest Port 1 View  02/26/2016  CLINICAL DATA:  Shortness of breath, hypoxia, shortness of breath. EXAM: PORTABLE CHEST 1 VIEW COMPARISON:  Portable chest x-ray of February 25, 2016 FINDINGS: There remains a large right pleural effusion. The volume of aerated right lung has decreased somewhat. On the left the retrocardiac region remains dense. There is no significant left pleural effusion. The cardiac silhouette is mildly enlarged though stable. The pulmonary vascularity is only mildly prominent centrally on  the left but is not well evaluated on the right. The endotracheal tube tip lies 3.4 cm above the carina. A right-sided subclavian catheter or PICC line has its tip projecting over the midportion of the SVC. Multiple displaced right-sided rib fractures are observed. IMPRESSION: Increased volume of pleural fluid on the right with decreased aeration of the right lung. Persistent left lower lobe atelectasis or pneumonia without significant left pleural effusion. Stable mild enlargement of the cardiac silhouette with central pulmonary vascular congestion. Electronically Signed   By: David  Martinique M.D.   On: 02/26/2016 07:47   Dg Chest Port 1 View  02/25/2016  CLINICAL DATA:  Hypoxia EXAM: PORTABLE CHEST 1 VIEW COMPARISON:  February 24, 2016 FINDINGS: Endotracheal tube tip is 2.3 cm above the carina. Central catheter tip is at the cavoatrial junction. No pneumothorax. There is a persistent sizable right effusion with patchy areas of airspace consolidation and compressive atelectasis on the right. There is a smaller left pleural effusion with left lower lobe atelectatic change. Heart is mildly enlarged with pulmonary vascularity felt to be within normal limits. No adenopathy is evident by radiography. IMPRESSION: Tube and catheter positions as described without pneumothorax. Sizable right effusion with areas of atelectasis and consolidation on the right, stable. Smaller left effusion with left lower lobe atelectasis. Stable cardiac silhouette. Overall appearance is stable compared to 1 day prior. Electronically Signed   By: Lowella Grip III M.D.   On: 02/25/2016 07:25     Medications:   . fentaNYL infusion INTRAVENOUS 200 mcg/hr (02/26/16 0900)   . antiseptic oral rinse  7 mL Mouth Rinse 10 times per day  . bisacodyl  10 mg Rectal Daily  . chlorhexidine gluconate (SAGE KIT)  15 mL Mouth Rinse BID  . diazepam  5 mg Oral 3 times per day  . escitalopram  10 mg Per Tube Daily  . feeding supplement (PIVOT 1.5  CAL)  1,000 mL Per Tube Q24H  . feeding supplement (PRO-STAT SUGAR FREE 64)  60 mL Per Tube TID  . free water  400 mL Per Tube Q4H  . furosemide  80 mg Intravenous Q12H  . insulin aspart  0-20 Units Subcutaneous 6 times per day  . lidocaine (PF)  30 mL Intradermal Once  . pantoprazole  40 mg Oral Daily   Or  . pantoprazole (PROTONIX) IV  40 mg Intravenous Daily  . sodium chloride flush  10-40 mL Intracatheter Q12H   fentaNYL, ipratropium-albuterol, LORazepam, ondansetron **OR** ondansetron (ZOFRAN) IV, sodium chloride flush  Assessment/ Plan:  Assessment/Plan: 35 year old black female with no medical history and normal renal function at baseline. She presents after a traumatic fall requiring multiple  surgeries. She had acute kidney injury earlier in hospitalization in now appears to have recurrence of that 1.Renal- second episode of acute kidney injury this hospitalization. BP continues to be intermittently soft to 90/50s. Continues to have good UOP (3.9L off /24 hours). Cr slightly improved 2.96. K 3.3, Replace . Will continue to monitor closely. No indications for dialysis at this time. 2. Hypertension/volume - Continues to be massively volume overloaded. However, she is also freewater deficient. Continue Lasix as BPs allow. 3. Anemia - situational, supportive care as needed 4. Hypernatremia- Stable from yesterday. Na 153. Continue free water through her oral tube.    LOS: [place], DO 02/26/16 @12 :03 PM

## 2016-02-26 NOTE — Care Management Note (Signed)
Case Management Note  Patient Details  Name: [name] Bell MRN: [id number] Date of Birth: 1981/10/17  Subjective/Objective:                    Action/Plan:   Expected Discharge Date:                  Expected Discharge Plan:     In-House Referral:  Clinical Social Work  Discharge planning Services  CM Consult  Post Acute Care Choice:    Choice offered to:     DME Arranged:    DME Agency:     HH Arranged:    HH Agency:     Status of Service:  In process, will continue to follow  Medicare Important Message Given:    Date Medicare IM Given:    Medicare IM give by:    Date Additional Medicare IM Given:    Additional Medicare Important Message give by:     If discussed at Long Length of Stay Meetings, dates discussed:  02-26-16  Additional Comments:  [name], [name], RN 02/26/2016, 11:17 AM

## 2016-02-26 NOTE — Anesthesia Postprocedure Evaluation (Signed)
Anesthesia Post Note  Patient: [name] Bell  Procedure(s) Performed: Procedure(s) (LRB): CHEST TUBE INSERTION (Right)  Patient location during evaluation: SICU Anesthesia Type: General Level of consciousness: sedated Pain management: pain level controlled Vital Signs Assessment: post-procedure vital signs reviewed and stable Respiratory status: patient remains intubated per anesthesia plan Cardiovascular status: stable Anesthetic complications: no    Last Vitals:  Filed Vitals:   02/26/16 1410 02/26/16 1414  BP: 104/61 100/58  Pulse: 79 79  Temp:    Resp: 20     Last Pain:  Filed Vitals:   02/26/16 1415  PainSc: Asleep                 Seena Face,W. EDMOND

## 2016-02-27 ENCOUNTER — Encounter (HOSPITAL_COMMUNITY): Payer: Self-pay | Admitting: Cardiothoracic Surgery

## 2016-02-27 ENCOUNTER — Inpatient Hospital Stay (HOSPITAL_COMMUNITY): Payer: Medicaid Other

## 2016-02-27 LAB — BASIC METABOLIC PANEL
Anion gap: 15 (ref 5–15)
BUN: 194 mg/dL — ABNORMAL HIGH (ref 6–20)
CO2: 26 mmol/L (ref 22–32)
Calcium: 8.7 mg/dL — ABNORMAL LOW (ref 8.9–10.3)
Chloride: 112 mmol/L — ABNORMAL HIGH (ref 101–111)
Creatinine, Ser: 2.91 mg/dL — ABNORMAL HIGH (ref 0.44–1.00)
GFR calc Af Amer: 23 mL/min — ABNORMAL LOW (ref >60)
GFR calc non Af Amer: 20 mL/min — ABNORMAL LOW (ref >60)
Glucose, Bld: 141 mg/dL — ABNORMAL HIGH (ref 65–99)
Potassium: 2.9 mmol/L — ABNORMAL LOW (ref 3.5–5.1)
Sodium: 153 mmol/L — ABNORMAL HIGH (ref 135–145)

## 2016-02-27 LAB — CBC
HCT: 24.9 % — ABNORMAL LOW (ref 36.0–46.0)
Hemoglobin: 7.9 g/dL — ABNORMAL LOW (ref 12.0–15.0)
MCH: 28.8 pg (ref 26.0–34.0)
MCHC: 31.7 g/dL (ref 30.0–36.0)
MCV: 90.9 fL (ref 78.0–100.0)
Platelets: 339 10*3/uL (ref 150–400)
RBC: 2.74 MIL/uL — ABNORMAL LOW (ref 3.87–5.11)
RDW: 23.6 % — ABNORMAL HIGH (ref 11.5–15.5)
WBC: 13.6 10*3/uL — ABNORMAL HIGH (ref 4.0–10.5)

## 2016-02-27 LAB — GLUCOSE, CAPILLARY
GLUCOSE-CAPILLARY: 119 mg/dL — AB (ref 65–99)
Glucose-Capillary: 112 mg/dL — ABNORMAL HIGH (ref 65–99)
Glucose-Capillary: 120 mg/dL — ABNORMAL HIGH (ref 65–99)
Glucose-Capillary: 123 mg/dL — ABNORMAL HIGH (ref 65–99)
Glucose-Capillary: 128 mg/dL — ABNORMAL HIGH (ref 65–99)
Glucose-Capillary: 130 mg/dL — ABNORMAL HIGH (ref 65–99)

## 2016-02-27 MED ORDER — POTASSIUM CHLORIDE 10 MEQ/50ML IV SOLN
10.0000 meq | INTRAVENOUS | Status: AC
  Administered 2016-02-27 (×4): 10 meq via INTRAVENOUS
  Filled 2016-02-27 (×4): qty 50

## 2016-02-27 MED ORDER — MIDAZOLAM HCL 2 MG/2ML IJ SOLN
INTRAMUSCULAR | Status: AC
  Administered 2016-02-27: 6 mg
  Filled 2016-02-27: qty 6

## 2016-02-27 MED ORDER — SODIUM CHLORIDE 0.9 % IV SOLN
25.0000 mg | Freq: Once | INTRAVENOUS | Status: AC
  Administered 2016-02-27: 25 mg via INTRAVENOUS
  Filled 2016-02-27: qty 0.5

## 2016-02-27 MED ORDER — POTASSIUM CHLORIDE 20 MEQ/15ML (10%) PO SOLN
60.0000 meq | Freq: Once | ORAL | Status: AC
  Administered 2016-02-27: 60 meq via ORAL
  Filled 2016-02-27: qty 45

## 2016-02-27 MED ORDER — IRON DEXTRAN 50 MG/ML IJ SOLN
250.0000 mg | Freq: Once | INTRAMUSCULAR | Status: AC
  Administered 2016-02-27: 250 mg via INTRAVENOUS
  Filled 2016-02-27 (×2): qty 5

## 2016-02-27 MED ORDER — POTASSIUM CHLORIDE 20 MEQ/15ML (10%) PO SOLN: 60.0000 meq | Freq: Every day | ORAL | Status: DC

## 2016-02-27 NOTE — Progress Notes (Signed)
Notified Trauma MD that pt potassium 2.9. Pt receiving 80mg  lasix 2x day. Orders given and will carry out. Will continue to monitor.

## 2016-02-27 NOTE — Progress Notes (Signed)
Upon assessment at 0630, pt R pupil 3 brisk, L pupil 5 brisk. Pt head shaking. No other neurologic changes. Notified trauma MD. No new orders given. Will continue to monitor.
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
## 2016-02-27 NOTE — Op Note (Signed)
OPERATIVE REPORT  DATE OF OPERATION: 02/26/2016  PATIENT:  [name] Bell  35 y.o. female  PRE-OPERATIVE DIAGNOSIS:  Open abdomen with ABRA device and NPWD, initial width 2.5 cm  POST-OPERATIVE DIAGNOSIS:  Same with width down to 1.5cm  FINDINGS:  Initial width 2.5 cm, after adjustment down to 1.5 cm  PROCEDURE:  Procedure(s): Change of NPWD Adjustment of ABRA device Osteopathic release of abdominal wall fascia  SURGEON:  Jimmye NormanJames Valera Vallas, M.D.  ASSISTANT: [place], NP-C  ANESTHESIA:   IV sedation  COMPLICATIONS:  None  EBL: <10 ml  BLOOD ADMINISTERED: none  DRAINS: Urinary Catheter (Foley) and Gastrostomy Tube   SPECIMEN:  No Specimen  COUNTS CORRECT:  YES  PROCEDURE DETAILS: The patient was given IV sedation with Fentanyl 400 mcg and Versed 6 mg total throughout the NPWD replacement and ABRA adjustment.  A proper timeout performed identifying the patient and the procedure to be performed.  The previous plastic portion of the midline dressing was prepped with ChloroPrep and the cut at the margin of the wound and removed.  The NPWD suction had been stopped prior to doing this.  We irrigated the wound clear of all exudate in the silastic inner protective tube, and measured the fascial width to be 3.0cm initially.  The Silastic bands were adjusted from the top to the bottom without breakage.  Subsequently the osteopathic fascial release was performed for 30 seconds x 3.  Subsequent measurement showed the midline fascial to be nearly abutting, and the distance/width to be 1.5 cm.  The NPWD was then replaced with an adequate seal.  The patient tolerated the procedure well.  PATIENT DISPOSITION:  Remained in the ICU, done at the patient's bedside.   [name] 4/21/201710:23 AM

## 2016-02-27 NOTE — Progress Notes (Signed)
[facility] ROUNDING NOTE   Subjective:   Interval History: continues to have good urine output (4.5L off in 24 hours).    Objective:  Vital signs in last 24 hours:  Temp:  [97.3 F (36.3 C)-100 F (37.8 C)] 97.3 F (36.3 C) (04/21 0800) Pulse Rate:  [75-94] 84 (04/21 0812) Resp:  [19-27] 21 (04/21 0812) BP: (90-137)/(45-77) 109/49 mmHg (04/21 0812) SpO2:  [93 %-100 %] 95 % (04/21 0812) FiO2 (%):  [40 %-50 %] 40 % (04/21 0812) Weight:  [291 lb 0.1 oz (132 kg)] 291 lb 0.1 oz (132 kg) (04/21 0400)  Weight change: -5 lb 8.2 oz (-2.5 kg) Filed Weights   02/25/16 0336 02/26/16 0500 02/27/16 0400  Weight: 300 lb 11.3 oz (136.4 kg) 296 lb 8.3 oz (134.5 kg) 291 lb 0.1 oz (132 kg)    Intake/Output: I/O last 3 completed shifts: In: 5185.2 [I.V.:834.6; Blood:370.6; NG/GT:3880; IV Piggyback:100] Out: 2549 [Urine:6340; Drains:585; Blood:5; Chest Tube:700]   Intake/Output this shift:  Total I/O In: 440 [I.V.:10; NG/GT:430] Out: 385 [Urine:335; Drains:50]  Gen: sedated, sitter at bedside CVS- RRR RS- intubated ABD- wound vac in place GU- foley catheter in place draining urine EXT- generalized edema present   Basic Metabolic Panel:  Recent Labs Lab 02/23/16 2040 02/24/16 0415 02/25/16 0327 02/25/16 1600 02/26/16 0349 02/27/16 0410  NA 154* 156* 153*  --  153* 153*  K 3.5 3.6 3.3* 3.8 3.3* 2.9*  CL 114* 114* 112*  --  112* 112*  CO2 27 27 26   --  25 26  GLUCOSE 148* 146* 133*  --  147* 141*  BUN 150* 153* 173*  --  184* 194*  CREATININE 2.80* 3.08* 3.01*  --  2.96* 2.91*  CALCIUM 9.0 8.9 8.9  --  9.1 8.7*  MG 2.1  --   --  2.1 2.2  --   PHOS 5.6*  --   --   --   --   --     Liver Function Tests:  Recent Labs Lab 02/24/16 0415 02/26/16 0349  AST 228* 215*  ALT 182* 186*  ALKPHOS 67 91  BILITOT 25.1* 25.5*  PROT 5.8* 6.2*  ALBUMIN 1.5* 1.6*   No results for input(s): LIPASE, AMYLASE in the last 168 hours. No results for input(s): AMMONIA in  the last 168 hours.  CBC:  Recent Labs Lab 02/24/16 0415 02/25/16 0327 02/25/16 1817 02/26/16 0349 02/27/16 0410  WBC 25.3* 22.4* 21.6* 19.1* 13.6*  NEUTROABS  --  19.3*  --   --   --   HGB 7.3* 6.8* 7.9* 8.5* 7.9*  HCT 22.9* 20.7* 24.5* 26.5* 24.9*  MCV 92.7 93.7 94.6 91.1 90.9  PLT 301 316 307 322 339    Cardiac Enzymes:  Recent Labs Lab 02/23/16 1100  CKTOTAL 866*    BNP: Invalid input(s): POCBNP  CBG:  Recent Labs Lab 02/26/16 1545 02/26/16 1949 02/26/16 2356 02/27/16 0357 02/27/16 0757  GLUCAP 108* 111* 130* 82* 120*    Microbiology: Results for orders placed or performed during the hospital encounter of 02/09/16  MRSA PCR Screening     Status: None   Collection Time: 02/11/16 11:30 AM  Result Value Ref Range Status   MRSA by PCR NEGATIVE NEGATIVE Final    Comment:        The GeneXpert MRSA Assay (FDA approved for NASAL specimens only), is one component of a comprehensive MRSA colonization surveillance program. It is not intended to diagnose MRSA infection nor to guide  or monitor treatment for MRSA infections.   Anaerobic culture     Status: None (Preliminary result)   Collection Time: 02/26/16  1:19 PM  Result Value Ref Range Status   Specimen Description FLUID PLEURAL RIGHT  Final   Special Requests POF ANCEF  Final   Gram Stain   Final    DEGENERATED CELLULAR MATERIAL PRESENT NO ORGANISMS SEEN    Culture PENDING  Incomplete   Report Status PENDING  Incomplete  Body fluid culture     Status: None (Preliminary result)   Collection Time: 02/26/16  1:19 PM  Result Value Ref Range Status   Specimen Description FLUID RIGHT PLEURAL  Final   Special Requests POF ANCEF  Final   Gram Stain   Final    DEGENERATED CELLULAR MATERIAL PRESENT NO ORGANISMS SEEN    Culture PENDING  Incomplete   Report Status PENDING  Incomplete    Coagulation Studies: No results for input(s): LABPROT, INR in the last 72 hours.  Urinalysis: No results for  input(s): COLORURINE, LABSPEC, PHURINE, GLUCOSEU, HGBUR, BILIRUBINUR, KETONESUR, PROTEINUR, UROBILINOGEN, NITRITE, LEUKOCYTESUR in the last 72 hours.  Invalid input(s): APPERANCEUR    Imaging: Dg Chest Port 1 View  02/27/2016  CLINICAL DATA:  35 year old female with a history of intubation. Admission 02/09/2016 as level 1 trauma after fall/jump from height. EXAM: PORTABLE CHEST 1 VIEW COMPARISON:  Multiple prior, most recent 02/26/2016. FINDINGS: Cardiomediastinal silhouette likely unchanged, with the right heart border obscured by overlying lung and pleural disease. Opacity the right lung persists with pleural parenchymal thickening. Obscuration of the right hemidiaphragm and the right heart border. Left lung relatively well aerated with obscuration of the retrocardiac region. Unchanged position of endotracheal tube, terminating suitably above the carina 3.5 cm. Unchanged right upper extremity PICC Contiguous right-sided rib fractures again noted. Chest tube on the right is unchanged, with the side port projecting external to the thoracic cavity. IMPRESSION: Similar appearance of the chest x-ray, with right lung opacity likely a combination of retained pleural fluid, edema, lung contusion/ consolidation. Note that the right thoracostomy tube is unchanged in position and on this study demonstrates the side-port external to the thoracic cavity. Multiple contiguous right-sided rib fractures. Unchanged right upper extremity PICC and endotracheal tube. Signed, Dulcy Fanny. Earleen Newport, DO [facility] Electronically Signed   By: Corrie Mckusick D.O.   On: 02/27/2016 07:49   Dg Chest Portable 1 View  02/26/2016  CLINICAL DATA:  Right-sided chest tube insertion. EXAM: PORTABLE CHEST 1 VIEW COMPARISON:  02/26/2016 at 6:51 a.m. FINDINGS: Since the earlier exam, right chest tube has been inserted between the right lateral fifth and sixth ribs. Tip projects just above  the superior lateral right hilum. There is evidence of decreased pleural fluid on the right. No convincing pneumothorax on this semi-erect exam. There is persistent hazy opacity throughout the right lung with some persistent pleural fluid. Multiple right-sided rib fractures are again noted. There is some opacity in the left lung base, likely atelectasis. Endotracheal tube and right PICC are stable and well positioned. IMPRESSION: 1. Status post right chest tube placement. No convincing pneumothorax. Evidence of decreased right pleural fluid since the earlier exam. Electronically Signed   By: Lajean Manes M.D.   On: 02/26/2016 14:21   Dg Chest Port 1 View  02/26/2016  CLINICAL DATA:  Shortness of breath, hypoxia, shortness of breath. EXAM: PORTABLE CHEST 1 VIEW COMPARISON:  Portable chest x-ray of February 25, 2016 FINDINGS: There remains a  large right pleural effusion. The volume of aerated right lung has decreased somewhat. On the left the retrocardiac region remains dense. There is no significant left pleural effusion. The cardiac silhouette is mildly enlarged though stable. The pulmonary vascularity is only mildly prominent centrally on the left but is not well evaluated on the right. The endotracheal tube tip lies 3.4 cm above the carina. A right-sided subclavian catheter or PICC line has its tip projecting over the midportion of the SVC. Multiple displaced right-sided rib fractures are observed. IMPRESSION: Increased volume of pleural fluid on the right with decreased aeration of the right lung. Persistent left lower lobe atelectasis or pneumonia without significant left pleural effusion. Stable mild enlargement of the cardiac silhouette with central pulmonary vascular congestion. Electronically Signed   By: David  Martinique M.D.   On: 02/26/2016 07:47     Medications:   . fentaNYL infusion INTRAVENOUS 100 mcg/hr (02/27/16 0700)   . antiseptic oral rinse  7 mL Mouth Rinse 10 times per day  . bisacodyl   10 mg Rectal Daily  . chlorhexidine gluconate (SAGE KIT)  15 mL Mouth Rinse BID  . diazepam  5 mg Oral 3 times per day  . escitalopram  10 mg Per Tube Daily  . feeding supplement (PIVOT 1.5 CAL)  1,000 mL Per Tube Q24H  . feeding supplement (PRO-STAT SUGAR FREE 64)  60 mL Per Tube TID  . free water  400 mL Per Tube Q4H  . furosemide  80 mg Intravenous Q12H  . insulin aspart  0-20 Units Subcutaneous 6 times per day  . iron dextran (INFED/DEXFERRUM) infusion  250 mg Intravenous Once  . lidocaine (PF)  30 mL Intradermal Once  . pantoprazole  40 mg Oral Daily   Or  . pantoprazole (PROTONIX) IV  40 mg Intravenous Daily  . potassium chloride  10 mEq Intravenous Q1 Hr x 4  . sodium chloride flush  10-40 mL Intracatheter Q12H   fentaNYL, ipratropium-albuterol, LORazepam, ondansetron **OR** ondansetron (ZOFRAN) IV, sodium chloride flush  Assessment/ Plan:  Assessment/Plan: 35 year old black female with no medical history and normal renal function at baseline. She presents after a traumatic fall requiring multiple surgeries. She had acute kidney injury earlier in hospitalization in now appears to have recurrence of that 1.Renal- second episode of acute kidney injury this hospitalization. BP continues to be intermittently soft to 90/50s. Continues to have good UOP (4.5L off /24 hours). Cr slightly improved 2.91. K 2.9, Replace.Kidneys seem to be slowly recovering.  No indications for dialysis at this time. 2. Hypertension/volume - Continues to be massively volume overloaded. Continue Lasix as BPs allow. 3. Anemia - situational, supportive care as needed 4. Hypernatremia- Stable from yesterday. Na 153. Continue free water through her oral tube.   Renal function appears to be slowly recovering.  No indications for HD at this time.  Will sign off.  Please do not hesitate to re-consult if we can be of further assistance.   LOS: [place], DO 02/27/16 @10 :56 AM

## 2016-02-27 NOTE — Progress Notes (Signed)
1 Day Post-Op Procedure(s) (LRB): CHEST TUBE INSERTION (Right) Subjective: Stable after right chest tube placement in OR Total output 750 cc old dark blood Operative culture of pleural fluid pending-patient not on antibioticswith chronically elevated white count Chest x-ray with pulmonary parenchymal opacification due to probable pulmonary contusion from trauma  Objective: Vital signs in last 24 hours: Temp:  [97.3 F (36.3 C)-100 F (37.8 C)] 97.3 F (36.3 C) (04/21 0800) Pulse Rate:  [75-94] 84 (04/21 0812) Cardiac Rhythm:  [-] Normal sinus rhythm (04/21 0800) Resp:  [19-27] 21 (04/21 0812) BP: (90-137)/(45-77) 109/49 mmHg (04/21 0812) SpO2:  [93 %-100 %] 95 % (04/21 0812) FiO2 (%):  [40 %-50 %] 40 % (04/21 0812) Weight:  [291 lb 0.1 oz (132 kg)] 291 lb 0.1 oz (132 kg) (04/21 0400)  Hemodynamic parameters for last 24 hours:   stable  Intake/Output from previous day: 04/20 0701 - 04/21 0700 In: 3040 [I.V.:590; NG/GT:2350; IV Piggyback:100] Out: 5530 [Urine:4500; Drains:325; Blood:5; Chest Tube:700] Intake/Output this shift: Total I/O In: 440 [I.V.:10; NG/GT:430] Out: 385 [Urine:335; Drains:50]    Lab Results:  Recent Labs  02/26/16 0349 02/27/16 0410  WBC 19.1* 13.6*  HGB 8.5* 7.9*  HCT 26.5* 24.9*  PLT 322 339   BMET:  Recent Labs  02/26/16 0349 02/27/16 0410  NA 153* 153*  K 3.3* 2.9*  CL 112* 112*  CO2 25 26  GLUCOSE 147* 141*  BUN 184* 194*  CREATININE 2.96* 2.91*  CALCIUM 9.1 8.7*    PT/INR: No results for input(s): LABPROT, INR in the last 72 hours. ABG    Component Value Date/Time   PHART 7.393 02/24/2016 0412   HCO3 27.0* 02/24/2016 0412   TCO2 28.4 02/24/2016 0412   ACIDBASEDEF 2.0 02/13/2016 0411   O2SAT 94.7 02/24/2016 0412   CBG (last 3)   Recent Labs  02/26/16 2356 02/27/16 0357 02/27/16 0757  GLUCAP 130* 123* 120*    Assessment/Plan: S/P Procedure(s) (LRB): CHEST TUBE INSERTION (Right) Continue chest tube to suction   follow-up chest x-ray in a.m. Leave dressings on chest for 72 hours   LOS: 18 days    Kathlee Nationseter Van Trigt III 02/27/2016

## 2016-02-27 NOTE — Progress Notes (Signed)
CRITICAL VALUE ALERT  Critical value received:  Organism growing in R pleural fluid culture  Date of notification:  02/27/16  Time of notification:  1314  Critical value read back:Yes.    Nurse who received alert:  Serena ColonelYeni Pineda, RN  MD notified (1st page):  Dr. Lindie SpruceWyatt  Time of first page:  1436  MD notified (2nd page):  Time of second page:  Responding MD:  Dr. Lindie SpruceWyatt  Time MD responded:  [phone]

## 2016-02-28 ENCOUNTER — Inpatient Hospital Stay (HOSPITAL_COMMUNITY): Payer: Medicaid Other

## 2016-02-28 DIAGNOSIS — R600 Localized edema: Secondary | ICD-10-CM

## 2016-02-28 LAB — COMPREHENSIVE METABOLIC PANEL
ALT: 105 U/L — ABNORMAL HIGH (ref 14–54)
ANION GAP: 14 (ref 5–15)
AST: 142 U/L — ABNORMAL HIGH (ref 15–41)
Albumin: 1.4 g/dL — ABNORMAL LOW (ref 3.5–5.0)
Alkaline Phosphatase: 89 U/L (ref 38–126)
BUN: 197 mg/dL — ABNORMAL HIGH (ref 6–20)
CALCIUM: 8.8 mg/dL — AB (ref 8.9–10.3)
CHLORIDE: 113 mmol/L — AB (ref 101–111)
CO2: 27 mmol/L (ref 22–32)
Creatinine, Ser: 2.87 mg/dL — ABNORMAL HIGH (ref 0.44–1.00)
GFR calc non Af Amer: 21 mL/min — ABNORMAL LOW (ref >60)
GFR, EST AFRICAN AMERICAN: 24 mL/min — AB (ref >60)
Glucose, Bld: 152 mg/dL — ABNORMAL HIGH (ref 65–99)
POTASSIUM: 3.2 mmol/L — AB (ref 3.5–5.1)
SODIUM: 154 mmol/L — AB (ref 135–145)
Total Bilirubin: 16.4 mg/dL — ABNORMAL HIGH (ref 0.3–1.2)
Total Protein: 5.7 g/dL — ABNORMAL LOW (ref 6.5–8.1)

## 2016-02-28 LAB — BLOOD GAS, ARTERIAL
ACID-BASE EXCESS: 2.6 mmol/L — AB (ref 0.0–2.0)
Bicarbonate: 25.5 mEq/L — ABNORMAL HIGH (ref 20.0–24.0)
DRAWN BY: 398661
FIO2: 0.4
MECHVT: 490 mL
O2 Saturation: 99.2 %
PEEP/CPAP: 5 cmH2O
Patient temperature: 98.6
RATE: 20 resp/min
TCO2: 26.5 mmol/L (ref 0–100)
pCO2 arterial: 32.4 mmHg — ABNORMAL LOW (ref 35.0–45.0)
pH, Arterial: 7.508 — ABNORMAL HIGH (ref 7.350–7.450)
pO2, Arterial: 121 mmHg — ABNORMAL HIGH (ref 80.0–100.0)

## 2016-02-28 LAB — CBC WITH DIFFERENTIAL/PLATELET
BASOS ABS: 0.1 10*3/uL (ref 0.0–0.1)
Basophils Relative: 1 %
Eosinophils Absolute: 0.2 10*3/uL (ref 0.0–0.7)
Eosinophils Relative: 2 %
HCT: 23.8 % — ABNORMAL LOW (ref 36.0–46.0)
Hemoglobin: 7.7 g/dL — ABNORMAL LOW (ref 12.0–15.0)
LYMPHS ABS: 1.3 10*3/uL (ref 0.7–4.0)
Lymphocytes Relative: 11 %
MCH: 30.3 pg (ref 26.0–34.0)
MCHC: 32.4 g/dL (ref 30.0–36.0)
MCV: 93.7 fL (ref 78.0–100.0)
MONO ABS: 1.1 10*3/uL — AB (ref 0.1–1.0)
Monocytes Relative: 9 %
NEUTROS PCT: 77 %
Neutro Abs: 9.4 10*3/uL — ABNORMAL HIGH (ref 1.7–7.7)
PLATELETS: 328 10*3/uL (ref 150–400)
RBC: 2.54 MIL/uL — AB (ref 3.87–5.11)
RDW: 24.7 % — AB (ref 11.5–15.5)
WBC: 12.1 10*3/uL — AB (ref 4.0–10.5)

## 2016-02-28 LAB — GLUCOSE, CAPILLARY
GLUCOSE-CAPILLARY: 134 mg/dL — AB (ref 65–99)
GLUCOSE-CAPILLARY: 102 mg/dL — AB (ref 65–99)
GLUCOSE-CAPILLARY: 113 mg/dL — AB (ref 65–99)
Glucose-Capillary: 123 mg/dL — ABNORMAL HIGH (ref 65–99)
Glucose-Capillary: 126 mg/dL — ABNORMAL HIGH (ref 65–99)
Glucose-Capillary: 129 mg/dL — ABNORMAL HIGH (ref 65–99)
Glucose-Capillary: 149 mg/dL — ABNORMAL HIGH (ref 65–99)

## 2016-02-28 LAB — BODY FLUID CULTURE

## 2016-02-28 MED ORDER — POTASSIUM CHLORIDE 10 MEQ/50ML IV SOLN
10.0000 meq | INTRAVENOUS | Status: AC
  Administered 2016-02-28 (×4): 10 meq via INTRAVENOUS
  Filled 2016-02-28 (×4): qty 50

## 2016-02-28 NOTE — Progress Notes (Signed)
*  PRELIMINARY RESULTS* Vascular Ultrasound Lower extremity venous duplex has been completed.  Preliminary findings: No evidence of DVT or baker's cyst.   Farrel DemarkJill Eunice, RDMS, RVT  02/28/2016, 1:38 PM

## 2016-02-28 NOTE — Progress Notes (Signed)
Attempted to wean on SBT going up to 22 psv.  Pt w/ RR 40's-50's.  RN at bedside and aware.  Pt placed back on full vent support.

## 2016-02-28 NOTE — Progress Notes (Signed)
Follow up - Trauma and Critical Care  Patient Details:    [name] Bell is an 35 y.o. female.  Lines/tubes : Airway 7.5 mm (Active)  Secured at (cm) 24 cm 02/28/2016  4:22 AM  Measured From Lips 02/28/2016  4:22 AM  Secured Location Center 02/28/2016  4:22 AM  Secured By [employer] 02/28/2016  4:22 AM  Tube Holder Repositioned Yes 02/28/2016  4:22 AM  Cuff Pressure (cm H2O) 24 cm H2O 02/28/2016  4:22 AM  Site Condition Dry 02/28/2016  4:22 AM     PICC Triple Lumen 02/15/16 PICC Right Cephalic 55 cm 0 cm (Active)  Indication for Insertion or Continuance of Line Prolonged intravenous therapies 02/27/2016  8:00 PM  Exposed Catheter (cm) 4 cm 02/25/2016  4:00 PM  Site Assessment Clean;Dry;Intact 02/27/2016  8:00 PM  Lumen #1 Status Infusing 02/27/2016  8:00 PM  Lumen #2 Status Occluded 02/27/2016  8:00 PM  Lumen #3 Status Infusing 02/27/2016  8:00 PM  Dressing Type Transparent;Securing device 02/27/2016  8:00 PM  Dressing Status Clean;Dry;Intact;Antimicrobial disc in place 02/27/2016  8:00 PM  Line Care Connections checked and tightened 02/27/2016  8:00 PM  Line Adjustment (NICU/IV Team Only) Yes 02/25/2016  4:00 PM  Dressing Intervention Dressing changed;Antimicrobial disc changed 02/25/2016  4:00 PM  Dressing Change Due 03/03/16 02/27/2016  8:00 PM     Chest Tube 1 Right Pleural 36 Fr. (Active)  Suction To water seal 02/27/2016  8:00 PM  Chest Tube Air Leak None 02/27/2016  8:00 PM  Patency Intervention Tip/tilt 02/27/2016  8:00 PM  Drainage Description Dark red 02/27/2016  8:00 PM  Dressing Status Clean;Dry;Intact 02/27/2016  8:00 PM  Dressing Intervention Other (Comment) 02/27/2016  8:00 AM  Site Assessment Intact 02/27/2016  8:00 PM  Surrounding Skin Intact 02/27/2016  8:00 AM  Output (mL) 50 mL 02/27/2016  6:00 PM     Negative Pressure Wound Therapy Abdomen Mid (Active)  Last dressing change 02/27/16 02/27/2016  8:00 PM  Site / Wound Assessment Pink 02/27/2016  8:00 PM  Peri-wound  Assessment Pink 02/27/2016  8:00 PM  Wound filler - Black foam 1 02/27/2016 12:00 PM  Cycle Continuous 02/27/2016  8:00 PM  Target Pressure (mmHg) 125 02/27/2016  8:00 PM  Canister Changed No 02/27/2016  8:00 PM  Dressing Status Intact 02/27/2016  8:00 PM  Drainage Amount Scant 02/27/2016  8:00 PM  Drainage Description Serosanguineous 02/27/2016  8:00 PM  Output (mL) 100 mL 02/27/2016  6:00 PM     Gastrostomy/Enterostomy Gastrostomy 24 Fr. LUQ (Active)  Surrounding Skin Dry;Intact 02/27/2016  8:00 PM  Tube Status Patent 02/27/2016  8:00 PM  Dressing Status None 02/27/2016  8:00 PM  Output (mL) 100 mL 02/18/2016  6:20 PM     Urethral Catheter S. Bowman,RN Latex 16 Fr. (Active)  Indication for Insertion or Continuance of Catheter Aggressive IV diuresis;Peri-operative use for selective surgical procedure 02/27/2016  8:00 PM  Site Assessment Intact;Swelling 02/27/2016  8:00 PM  Catheter Maintenance Bag below level of bladder;Catheter secured;Drainage bag/tubing not touching floor;Insertion date on drainage bag;No dependent loops;Seal intact 02/27/2016  8:00 PM  Collection Container Standard drainage bag 02/27/2016  8:00 PM  Securement Method Securing device (Describe) 02/27/2016  8:00 PM  Urinary Catheter Interventions Unclamped 02/27/2016  8:00 PM  Output (mL) 760 mL 02/28/2016  4:00 AM    Microbiology/Sepsis markers: Results for orders placed or performed during the hospital encounter of 02/09/16  MRSA PCR Screening     Status: None  Collection Time: 02/11/16 11:30 AM  Result Value Ref Range Status   MRSA by PCR NEGATIVE NEGATIVE Final    Comment:        The GeneXpert MRSA Assay (FDA approved for NASAL specimens only), is one component of a comprehensive MRSA colonization surveillance program. It is not intended to diagnose MRSA infection nor to guide or monitor treatment for MRSA infections.   Anaerobic culture     Status: None (Preliminary result)   Collection Time: 02/26/16  1:19 PM   Result Value Ref Range Status   Specimen Description FLUID PLEURAL RIGHT  Final   Special Requests POF ANCEF  Final   Gram Stain   Final    DEGENERATED CELLULAR MATERIAL PRESENT NO ORGANISMS SEEN    Culture PENDING  Incomplete   Report Status PENDING  Incomplete  Body fluid culture     Status: None (Preliminary result)   Collection Time: 02/26/16  1:19 PM  Result Value Ref Range Status   Specimen Description FLUID RIGHT PLEURAL  Final   Special Requests POF ANCEF  Final   Gram Stain   Final    DEGENERATED CELLULAR MATERIAL PRESENT NO ORGANISMS SEEN    Culture   Final    CULTURE REINCUBATED FOR BETTER GROWTH CRITICAL RESULT CALLED TO, READ BACK BY AND VERIFIED WITH: J [facility] Center[facility]EDA 02/27/16 @ 1314 M VESTAL    Report Status PENDING  Incomplete    Anti-infectives:  Anti-infectives    Start     Dose/Rate Route Frequency Ordered Stop   02/11/16 0600  piperacillin-tazobactam (ZOSYN) IVPB 3.375 g  Status:  Discontinued     3.375 g 100 mL/hr over 30 Minutes Intravenous 4 times per day 02/10/16 2214 02/22/16 0853   02/10/16 1730  piperacillin-tazobactam (ZOSYN) IVPB 3.375 g  Status:  Discontinued     3.375 g 12.5 mL/hr over 240 Minutes Intravenous 3 times per day 02/10/16 1727 02/10/16 2214      Best Practice/Protocols:  VTE Prophylaxis: Mechanical GI Prophylaxis: Proton Pump Inhibitor Continous Sedation  Consults: Treatment Team:  Myrene GalasMichael [name], MD Annie SableKellie Goldsborough, MD Kerin PernaPeter Van Trigt, MD    Events:  Subjective:    Overnight Issues: No issues overnight.  Objective:  Vital signs for last 24 hours: Temp:  [97.3 F (36.3 C)-100 F (37.8 C)] 99.5 F (37.5 C) (04/22 0400) Pulse Rate:  [81-99] 85 (04/22 0422) Resp:  [0-36] 0 (04/22 0422) BP: (76-125)/(37-68) 104/54 mmHg (04/22 0200) SpO2:  [92 %-100 %] 100 % (04/22 0422) FiO2 (%):  [40 %] 40 % (04/22 0422) Weight:  [129.6 kg (285 lb 11.5 oz)] 129.6 kg (285 lb 11.5 oz) (04/22 0422)  Hemodynamic parameters for last  24 hours:    Intake/Output from previous day: 04/21 0701 - 04/22 0700 In: 3101.1 [I.V.:226.1; NG/GT:2570; IV Piggyback:305] Out: 4435 [Urine:4235; Drains:150; Chest Tube:50]  Intake/Output this shift: Total I/O In: 1104 [I.V.:94; NG/GT:1010] Out: 1875 [Urine:1875]  Vent settings for last 24 hours: Vent Mode:  [-] PRVC FiO2 (%):  [40 %] 40 % Set Rate:  [20 bmp] 20 bmp Vt Set:  [490 mL] 490 mL PEEP:  [5 cmH20] 5 cmH20 Plateau Pressure:  [21 cmH20-26 cmH20] 25 cmH20  Physical Exam:  General: no respiratory distress and will awaken with some work Neuro: nonfocal exam and RASS -1 Resp: clear to auscultation bilaterally CVS: regular rate and rhythm, S1, S2 normal, no murmur, click, rub or gallop GI: Open abdomen with NPWD in place, tolerating tube feedings. Extremities: edema 2+ and Most of  edema is in the lower extremities.  Much improved edema in the upper extremities.  Results for orders placed or performed during the hospital encounter of 02/09/16 (from the past 24 hour(s))  Glucose, capillary     Status: Abnormal   Collection Time: 02/27/16  7:57 AM  Result Value Ref Range   Glucose-Capillary 120 (H) 65 - 99 mg/dL  Glucose, capillary     Status: Abnormal   Collection Time: 02/27/16 11:35 AM  Result Value Ref Range   Glucose-Capillary 119 (H) 65 - 99 mg/dL   Comment 1 Notify RN    Comment 2 Document in Chart   Glucose, capillary     Status: Abnormal   Collection Time: 02/27/16  3:37 PM  Result Value Ref Range   Glucose-Capillary 112 (H) 65 - 99 mg/dL  Glucose, capillary     Status: Abnormal   Collection Time: 02/27/16  8:15 PM  Result Value Ref Range   Glucose-Capillary 128 (H) 65 - 99 mg/dL  Glucose, capillary     Status: Abnormal   Collection Time: 02/27/16 11:36 PM  Result Value Ref Range   Glucose-Capillary 123 (H) 65 - 99 mg/dL  Glucose, capillary     Status: Abnormal   Collection Time: 02/28/16  3:59 AM  Result Value Ref Range   Glucose-Capillary 134 (H) 65 -  99 mg/dL  CBC with Differential/Platelet     Status: Abnormal (Preliminary result)   Collection Time: 02/28/16  5:00 AM  Result Value Ref Range   WBC 12.1 (H) 4.0 - 10.5 K/uL   RBC 2.54 (L) 3.87 - 5.11 MIL/uL   Hemoglobin 7.7 (L) 12.0 - 15.0 g/dL   HCT 16.1 (L) 09.6 - 04.5 %   MCV 93.7 78.0 - 100.0 fL   MCH 30.3 26.0 - 34.0 pg   MCHC 32.4 30.0 - 36.0 g/dL   RDW 40.9 (H) 81.1 - 91.4 %   Platelets 328 150 - 400 K/uL   Neutrophils Relative % PENDING %   Neutro Abs PENDING 1.7 - 7.7 K/uL   Band Neutrophils PENDING %   Lymphocytes Relative PENDING %   Lymphs Abs PENDING 0.7 - 4.0 K/uL   Monocytes Relative PENDING %   Monocytes Absolute PENDING 0.1 - 1.0 K/uL   Eosinophils Relative PENDING %   Eosinophils Absolute PENDING 0.0 - 0.7 K/uL   Basophils Relative PENDING %   Basophils Absolute PENDING 0.0 - 0.1 K/uL   WBC Morphology PENDING    RBC Morphology PENDING    Smear Review PENDING    nRBC PENDING 0 /100 WBC   Metamyelocytes Relative PENDING %   Myelocytes PENDING %   Promyelocytes Absolute PENDING %   Blasts PENDING %  Comprehensive metabolic panel     Status: Abnormal   Collection Time: 02/28/16  5:00 AM  Result Value Ref Range   Sodium 154 (H) 135 - 145 mmol/L   Potassium 3.2 (L) 3.5 - 5.1 mmol/L   Chloride 113 (H) 101 - 111 mmol/L   CO2 27 22 - 32 mmol/L   Glucose, Bld 152 (H) 65 - 99 mg/dL   BUN 782 (H) 6 - 20 mg/dL   Creatinine, Ser 9.56 (H) 0.44 - 1.00 mg/dL   Calcium 8.8 (L) 8.9 - 10.3 mg/dL   Total Protein 5.7 (L) 6.5 - 8.1 g/dL   Albumin 1.4 (L) 3.5 - 5.0 g/dL   AST 213 (H) 15 - 41 U/L   ALT 105 (H) 14 - 54 U/L   Alkaline Phosphatase  89 38 - 126 U/L   Total Bilirubin 16.4 (H) 0.3 - 1.2 mg/dL   GFR calc non Af Amer 21 (L) >60 mL/min   GFR calc Af Amer 24 (L) >60 mL/min   Anion gap 14 5 - 15     Assessment/Plan:   NEURO  Altered Mental Status:  sedation   Plan: No changes as the patient is stable.  PULM  Signficant effusion on the right side still.   Minimal output from the right chets tube.  Last hole does not appear to be in the thoracic cavity, but it is underneath the thick subcutaceous tissue and should maintain negative pressure.  Effusion appers to be enlargin a bit   Plan: CPM.  Thoracic surgery is following this patient and clinically she is functioning well.  Will check an ABG  CARDIO  No current issues   Plan: CPM  RENAL  Quality of urine is bilious, but output is good.   Plan: Negative about 1600 cc yesterday.  Renal function still abnormal with BUN 197, creatinine is down a bit.  GI  Open abdomen and may be able to close next week.  We are planning on trach and fascial closure on Monday with retention suturs.   Plan: CPPM with tube feedings for now.  ID  Empyema (right, post trauma and apparently there is some bacteria growing out of her effusion sent from the OR the other day.  Cultres are pending.  Her fever is dwon and her WBC is improving.)   Plan: CPM  HEME  Anemia acute blood loss anemia and anemia of critical illness)   Plan: Hgb down a bit, but platelets are stable.  ENDO No known issues.   Plan: CPM  Global Issues  Stable.  Lower extremity edema is concerning for possible DVT.  Now on pharmacological prophylaxis because of dropping hemoglobin.  Trach and possible fascial closure on MOnday.  Place CT on suction.    LOS: 19 days   Additional comments:I reviewed the patient's new clinical lab test results. cbc/bmet and I reviewed the patients new imaging test results. cxr  Critical Care Total Time*: 30 Minutes  Leniyah Martell 02/28/2016  *Care during the described time interval was provided by me and/or other providers on the critical care team.  I have reviewed this patient's available data, including medical history, events of note, physical examination and test results as part of my evaluation.

## 2016-02-28 NOTE — Progress Notes (Addendum)
      [address]       [place] [id number]             [phone]      2 Days Post-Op Procedure(s) (LRB): CHEST TUBE INSERTION (Right)   Subjective:  Attempted to wean from vent this morning, now fully sedated  Objective: Vital signs in last 24 hours: Temp:  [98.8 F (37.1 C)-100 F (37.8 C)] 99.8 F (37.7 C) (04/22 0800) Pulse Rate:  [78-98] 82 (04/22 1100) Cardiac Rhythm:  [-] Normal sinus rhythm (04/22 0800) Resp:  [0-30] 20 (04/22 1100) BP: (89-121)/(43-68) 91/43 mmHg (04/22 1100) SpO2:  [95 %-100 %] 97 % (04/22 1100) FiO2 (%):  [40 %] 40 % (04/22 0820) Weight:  [285 lb 11.5 oz (129.6 kg)] 285 lb 11.5 oz (129.6 kg) (04/22 0422)  Intake/Output from previous day: 04/21 0701 - 04/22 0700 In: 3776.1 [I.V.:301.1; NG/GT:3120; IV Piggyback:355] Out: 2130 [[id number] [Urine:4485; Drains:250; Chest Tube:60] Intake/Output this shift: Total I/O In: 520 [I.V.:30; NG/GT:490] Out: 725 [Urine:725]  General appearance: sedated on vent Heart: regular rate and rhythm Lungs: clear to auscultation bilaterally Wound: chest tube site clean and dry  Lab Results:  Recent Labs  02/27/16 0410 02/28/16 0500  WBC 13.6* 12.1*  HGB 7.9* 7.7*  HCT 24.9* 23.8*  PLT 339 328   BMET:  Recent Labs  02/27/16 0410 02/28/16 0500  NA 153* 154*  K 2.9* 3.2*  CL 112* 113*  CO2 26 27  GLUCOSE 141* 152*  BUN 194* 197*  CREATININE 2.91* 2.87*  CALCIUM 8.7* 8.8*    PT/INR: No results for input(s): LABPROT, INR in the last 72 hours. ABG    Component Value Date/Time   PHART 7.508* 02/28/2016 0642   HCO3 25.5* 02/28/2016 0642   TCO2 26.5 02/28/2016 0642   ACIDBASEDEF 2.0 02/13/2016 0411   O2SAT 99.2 02/28/2016 0642   CBG (last 3)   Recent Labs  02/27/16 2336 02/28/16 0359 02/28/16 0759  GLUCAP 123* 134* 102*    Assessment/Plan: S/P Procedure(s) (LRB): CHEST TUBE INSERTION (Right)  1. Chest tube- minimal drainage yesterday 60 cc- CXR with continued moderate right  sided pneumothorax, chest tube has last hole outside of thoracic cavity leave chest tube on suction for now 2. Dispo- care per trauma, continue chest tube   LOS: 19 days    BARRETT, ERIN 02/28/2016  I have seen and examined the patient and agree with the assessment and plan as outlined.  Purcell Nailslarence H Cloyce Blankenhorn, MD

## 2016-02-29 ENCOUNTER — Encounter (HOSPITAL_COMMUNITY): Payer: Self-pay | Admitting: Radiology

## 2016-02-29 ENCOUNTER — Inpatient Hospital Stay (HOSPITAL_COMMUNITY): Payer: Medicaid Other

## 2016-02-29 LAB — BASIC METABOLIC PANEL
ANION GAP: 15 (ref 5–15)
BUN: 206 mg/dL — ABNORMAL HIGH (ref 6–20)
CALCIUM: 8.9 mg/dL (ref 8.9–10.3)
CHLORIDE: 115 mmol/L — AB (ref 101–111)
CO2: 27 mmol/L (ref 22–32)
Creatinine, Ser: 2.64 mg/dL — ABNORMAL HIGH (ref 0.44–1.00)
GFR calc Af Amer: 26 mL/min — ABNORMAL LOW (ref >60)
GFR calc non Af Amer: 23 mL/min — ABNORMAL LOW (ref >60)
GLUCOSE: 154 mg/dL — AB (ref 65–99)
Potassium: 3.2 mmol/L — ABNORMAL LOW (ref 3.5–5.1)
Sodium: 157 mmol/L — ABNORMAL HIGH (ref 135–145)

## 2016-02-29 LAB — CBC WITH DIFFERENTIAL/PLATELET
BAND NEUTROPHILS: 18 %
BASOS PCT: 0 %
Basophils Absolute: 0 10*3/uL (ref 0.0–0.1)
Blasts: 0 %
EOS ABS: 0.2 10*3/uL (ref 0.0–0.7)
EOS PCT: 2 %
HCT: 23.9 % — ABNORMAL LOW (ref 36.0–46.0)
Hemoglobin: 7.6 g/dL — ABNORMAL LOW (ref 12.0–15.0)
LYMPHS ABS: 2.1 10*3/uL (ref 0.7–4.0)
LYMPHS PCT: 19 %
MCH: 30 pg (ref 26.0–34.0)
MCHC: 31.8 g/dL (ref 30.0–36.0)
MCV: 94.5 fL (ref 78.0–100.0)
MONO ABS: 0.3 10*3/uL (ref 0.1–1.0)
Metamyelocytes Relative: 2 %
Monocytes Relative: 3 %
Myelocytes: 0 %
NEUTROS ABS: 8.5 10*3/uL — AB (ref 1.7–7.7)
NRBC: 0 /100{WBCs}
Neutrophils Relative %: 53 %
OTHER: 0 %
PROMYELOCYTES ABS: 3 %
Platelets: 331 10*3/uL (ref 150–400)
RBC: 2.53 MIL/uL — ABNORMAL LOW (ref 3.87–5.11)
RDW: 25.7 % — ABNORMAL HIGH (ref 11.5–15.5)
WBC: 11.1 10*3/uL — ABNORMAL HIGH (ref 4.0–10.5)

## 2016-02-29 LAB — GLUCOSE, CAPILLARY
GLUCOSE-CAPILLARY: 157 mg/dL — AB (ref 65–99)
GLUCOSE-CAPILLARY: 125 mg/dL — AB (ref 65–99)
GLUCOSE-CAPILLARY: 151 mg/dL — AB (ref 65–99)
GLUCOSE-CAPILLARY: 138 mg/dL — AB (ref 65–99)
GLUCOSE-CAPILLARY: 141 mg/dL — AB (ref 65–99)
Glucose-Capillary: 118 mg/dL — ABNORMAL HIGH (ref 65–99)

## 2016-02-29 LAB — PREPARE RBC (CROSSMATCH)

## 2016-02-29 MED ORDER — SODIUM CHLORIDE 0.9 % IV SOLN: Freq: Once | INTRAVENOUS | Status: DC

## 2016-02-29 MED ORDER — DEXTROSE 5 % IV SOLN
INTRAVENOUS | Status: DC
  Administered 2016-02-29 – 2016-03-04 (×10): via INTRAVENOUS
  Administered 2016-03-05: 125 mL via INTRAVENOUS
  Administered 2016-03-06 (×3): via INTRAVENOUS
  Administered 2016-03-07: 125 mL via INTRAVENOUS
  Administered 2016-03-07: 21:00:00 via INTRAVENOUS
  Administered 2016-03-07: 1000 mL via INTRAVENOUS
  Administered 2016-03-08 – 2016-03-10 (×3): via INTRAVENOUS
  Administered 2016-03-11: 1000 mL via INTRAVENOUS

## 2016-02-29 MED ORDER — ENOXAPARIN SODIUM 40 MG/0.4ML ~~LOC~~ SOLN
40.0000 mg | SUBCUTANEOUS | Status: DC
  Administered 2016-02-29 – 2016-03-02 (×2): 40 mg via SUBCUTANEOUS
  Filled 2016-02-29 (×2): qty 0.4

## 2016-02-29 MED ORDER — CEFAZOLIN SODIUM-DEXTROSE 2-4 GM/100ML-% IV SOLN
2.0000 g | INTRAVENOUS | Status: AC
  Administered 2016-03-01: 2 g via INTRAVENOUS
  Filled 2016-02-29 (×2): qty 100

## 2016-02-29 NOTE — Procedures (Signed)
Due to increased PIP, tight/coarse breath sound, and decreased SpO2, pt given treatment, bite block inserted along with pt lavaged.

## 2016-02-29 NOTE — Progress Notes (Signed)
Pt's vent alarming "pressure regulation limited" repeatedly. Suctioned pt. Pulled pt up in bed and elevated HOB more. Gave bolus of fent from gtt. No change. RT notified. Suctioned pt. No change. Thought pt was gaging on ETT. Ativan given. No change- vent still alarming. CXR made STAT. Awaiting results. Vent stable at this time. Will cont to monitor.

## 2016-02-29 NOTE — Progress Notes (Signed)
Follow up - Trauma and Critical Care  Patient Details:    [name] Bell is an 35 y.o. female.  Lines/tubes : Airway 7.5 mm (Active)  Secured at (cm) 24 cm 02/29/2016  3:19 AM  Measured From Lips 02/29/2016  3:19 AM  Secured Location Center 02/29/2016  3:19 AM  Secured By [employer] 02/29/2016  3:19 AM  Tube Holder Repositioned Yes 02/29/2016  3:19 AM  Cuff Pressure (cm H2O) 24 cm H2O 02/28/2016  8:20 AM  Site Condition Dry 02/29/2016  3:19 AM     PICC Triple Lumen 02/15/16 PICC Right Cephalic 55 cm 0 cm (Active)  Indication for Insertion or Continuance of Line Prolonged intravenous therapies 02/28/2016  8:00 PM  Exposed Catheter (cm) 4 cm 02/25/2016  4:00 PM  Site Assessment Clean;Dry;Intact 02/28/2016  8:00 PM  Lumen #1 Status Infusing 02/28/2016  8:00 PM  Lumen #2 Status Occluded 02/28/2016  8:00 PM  Lumen #3 Status Infusing 02/28/2016  8:00 PM  Dressing Type Transparent;Securing device 02/28/2016  8:00 PM  Dressing Status Clean;Dry;Intact;Antimicrobial disc in place 02/28/2016  8:00 PM  Line Care Connections checked and tightened 02/28/2016  8:00 PM  Line Adjustment (NICU/IV Team Only) Yes 02/25/2016  4:00 PM  Dressing Intervention Dressing changed;Antimicrobial disc changed 02/25/2016  4:00 PM  Dressing Change Due 03/03/16 02/28/2016  8:00 PM     Chest Tube 1 Right Pleural 36 Fr. (Active)  Suction -20 cm H2O 02/28/2016  8:00 PM  Chest Tube Air Leak None 02/28/2016  8:00 PM  Patency Intervention Tip/tilt 02/28/2016  8:00 PM  Drainage Description Dark red 02/28/2016  8:00 PM  Dressing Status Clean;Dry;Intact 02/28/2016  8:00 PM  Dressing Intervention Other (Comment) 02/27/2016  8:00 AM  Site Assessment Intact 02/28/2016  8:00 PM  Surrounding Skin Intact 02/28/2016  8:00 PM  Output (mL) 0 mL 02/29/2016  6:00 AM     Negative Pressure Wound Therapy Abdomen Mid (Active)  Last dressing change 02/27/16 02/28/2016  8:00 AM  Site / Wound Assessment Pink 02/28/2016  8:00 AM  Peri-wound  Assessment Pink 02/28/2016  8:00 AM  Wound filler - Black foam 1 02/27/2016 12:00 PM  Cycle Continuous 02/28/2016  8:00 PM  Target Pressure (mmHg) 125 02/28/2016  8:00 PM  Canister Changed No 02/28/2016  8:00 AM  Dressing Status Intact 02/28/2016  8:00 AM  Drainage Amount Scant 02/28/2016  8:00 AM  Drainage Description Serosanguineous 02/28/2016  8:00 AM  Output (mL) 100 mL 02/29/2016  6:00 AM     Gastrostomy/Enterostomy Gastrostomy 24 Fr. LUQ (Active)  Surrounding Skin Dry;Intact 02/28/2016  8:00 PM  Tube Status Patent 02/28/2016  8:00 PM  Dressing Status None 02/28/2016  8:00 PM  Output (mL) 100 mL 02/18/2016  6:20 PM     Urethral Catheter S. Bowman,RN Latex 16 Fr. (Active)  Indication for Insertion or Continuance of Catheter Aggressive IV diuresis 02/28/2016  8:00 PM  Site Assessment Intact;Swelling 02/28/2016  8:00 PM  Catheter Maintenance Bag below level of bladder;Catheter secured;Drainage bag/tubing not touching floor;Insertion date on drainage bag;Seal intact;No dependent loops 02/28/2016  8:00 PM  Collection Container Standard drainage bag 02/28/2016  8:00 PM  Securement Method Securing device (Describe) 02/28/2016  8:00 PM  Urinary Catheter Interventions Unclamped 02/28/2016  8:00 PM  Output (mL) 350 mL 02/29/2016  6:00 AM    Microbiology/Sepsis markers: Results for orders placed or performed during the hospital encounter of 02/09/16  MRSA PCR Screening     Status: None   Collection Time: 02/11/16 11:30  AM  Result Value Ref Range Status   MRSA by PCR NEGATIVE NEGATIVE Final    Comment:        The GeneXpert MRSA Assay (FDA approved for NASAL specimens only), is one component of a comprehensive MRSA colonization surveillance program. It is not intended to diagnose MRSA infection nor to guide or monitor treatment for MRSA infections.   Anaerobic culture     Status: None (Preliminary result)   Collection Time: 02/26/16  1:19 PM  Result Value Ref Range Status   Specimen Description  FLUID PLEURAL RIGHT  Final   Special Requests POF ANCEF  Final   Gram Stain   Final    DEGENERATED CELLULAR MATERIAL PRESENT NO ORGANISMS SEEN    Culture   Final    NO ANAEROBES ISOLATED; CULTURE IN PROGRESS FOR 5 DAYS   Report Status PENDING  Incomplete  Body fluid culture     Status: None   Collection Time: 02/26/16  1:19 PM  Result Value Ref Range Status   Specimen Description FLUID RIGHT PLEURAL  Final   Special Requests POF ANCEF  Final   Gram Stain   Final    DEGENERATED CELLULAR MATERIAL PRESENT NO ORGANISMS SEEN    Culture   Final    MULTIPLE ORGANISMS PRESENT, NONE PREDOMINANT CRITICAL RESULT CALLED TO, READ BACK BY AND VERIFIED WITH: J PINEDA 02/27/16 @ 1314 M VESTAL    Report Status 02/28/2016 FINAL  Final    Anti-infectives:  Anti-infectives    Start     Dose/Rate Route Frequency Ordered Stop   02/11/16 0600  piperacillin-tazobactam (ZOSYN) IVPB 3.375 g  Status:  Discontinued     3.375 g 100 mL/hr over 30 Minutes Intravenous 4 times per day 02/10/16 2214 02/22/16 0853   02/10/16 1730  piperacillin-tazobactam (ZOSYN) IVPB 3.375 g  Status:  Discontinued     3.375 g 12.5 mL/hr over 240 Minutes Intravenous 3 times per day 02/10/16 1727 02/10/16 2214      Best Practice/Protocols:  VTE Prophylaxis: Mechanical GI Prophylaxis: Proton Pump Inhibitor Intermittent Sedation Continous Sedation  Consults: Treatment Team:  Myrene Galas, MD Annie Sable, MD Kerin Perna, MD    Events:  Subjective:    Overnight Issues: Patient is not as responsive today as she has been.  Right foot is warm and swollen  Objective:  Vital signs for last 24 hours: Temp:  [98.5 F (36.9 C)-100.1 F (37.8 C)] 99.9 F (37.7 C) (04/23 0754) Pulse Rate:  [80-91] 86 (04/23 0600) Resp:  [17-25] 20 (04/23 0600) BP: (88-118)/(43-70) 108/61 mmHg (04/23 0600) SpO2:  [90 %-100 %] 100 % (04/23 0600) FiO2 (%):  [40 %] 40 % (04/23 0418) Weight:  [127.5 kg (281 lb 1.4 oz)] 127.5  kg (281 lb 1.4 oz) (04/23 0500)  Hemodynamic parameters for last 24 hours:    Intake/Output from previous day: 04/22 0701 - 04/23 0700 In: 1760 [I.V.:270; NG/GT:1490] Out: 4450 [Urine:4300; Drains:150]  Intake/Output this shift:    Vent settings for last 24 hours: Vent Mode:  [-] PRVC FiO2 (%):  [40 %] 40 % Set Rate:  [20 bmp] 20 bmp Vt Set:  [490 mL] 490 mL PEEP:  [5 cmH20] 5 cmH20 Plateau Pressure:  [21 cmH20-26 cmH20] 24 cmH20  Physical Exam:  General: no respiratory distress and grimaces only. Neuro: nonfocal exam, RASS 0 and RASS -1 Resp: clear to auscultation bilaterally CVS: regular rate and rhythm, S1, S2 normal, no murmur, click, rub or gallop GI: Open abdomen with ABRA  and NPWD in place.  "the move" has been inconsiistently performed. Extremities: edema 3+, unequal size and palpable pulse in the right foot which is swollen and apparently tender by her reaction to manipulation.  Some skin blistering of the right foot.  Toes are black, but not apparently gangrenous.  Results for orders placed or performed during the hospital encounter of 02/09/16 (from the past 24 hour(s))  Glucose, capillary     Status: Abnormal   Collection Time: 02/28/16 11:40 AM  Result Value Ref Range   Glucose-Capillary 126 (H) 65 - 99 mg/dL  Glucose, capillary     Status: Abnormal   Collection Time: 02/28/16  3:32 PM  Result Value Ref Range   Glucose-Capillary 129 (H) 65 - 99 mg/dL  Glucose, capillary     Status: Abnormal   Collection Time: 02/28/16  7:52 PM  Result Value Ref Range   Glucose-Capillary 113 (H) 65 - 99 mg/dL  Glucose, capillary     Status: Abnormal   Collection Time: 02/28/16 11:23 PM  Result Value Ref Range   Glucose-Capillary 149 (H) 65 - 99 mg/dL  Glucose, capillary     Status: Abnormal   Collection Time: 02/29/16  3:32 AM  Result Value Ref Range   Glucose-Capillary 118 (H) 65 - 99 mg/dL  Basic metabolic panel     Status: Abnormal   Collection Time: 02/29/16  5:07 AM   Result Value Ref Range   Sodium 157 (H) 135 - 145 mmol/L   Potassium 3.2 (L) 3.5 - 5.1 mmol/L   Chloride 115 (H) 101 - 111 mmol/L   CO2 27 22 - 32 mmol/L   Glucose, Bld 154 (H) 65 - 99 mg/dL   BUN 409 (H) 6 - 20 mg/dL   Creatinine, Ser 8.11 (H) 0.44 - 1.00 mg/dL   Calcium 8.9 8.9 - 91.4 mg/dL   GFR calc non Af Amer 23 (L) >60 mL/min   GFR calc Af Amer 26 (L) >60 mL/min   Anion gap 15 5 - 15  CBC with Differential/Platelet     Status: Abnormal (Preliminary result)   Collection Time: 02/29/16  5:07 AM  Result Value Ref Range   WBC 11.1 (H) 4.0 - 10.5 K/uL   RBC 2.53 (L) 3.87 - 5.11 MIL/uL   Hemoglobin 7.6 (L) 12.0 - 15.0 g/dL   HCT 78.2 (L) 95.6 - 21.3 %   MCV 94.5 78.0 - 100.0 fL   MCH 30.0 26.0 - 34.0 pg   MCHC 31.8 30.0 - 36.0 g/dL   RDW 08.6 (H) 57.8 - 46.9 %   Platelets 331 150 - 400 K/uL   Neutrophils Relative % PENDING %   Neutro Abs PENDING 1.7 - 7.7 K/uL   Band Neutrophils PENDING %   Lymphocytes Relative PENDING %   Lymphs Abs PENDING 0.7 - 4.0 K/uL   Monocytes Relative PENDING %   Monocytes Absolute PENDING 0.1 - 1.0 K/uL   Eosinophils Relative PENDING %   Eosinophils Absolute PENDING 0.0 - 0.7 K/uL   Basophils Relative PENDING %   Basophils Absolute PENDING 0.0 - 0.1 K/uL   WBC Morphology PENDING    RBC Morphology PENDING    Smear Review PENDING    nRBC PENDING 0 /100 WBC   Metamyelocytes Relative PENDING %   Myelocytes PENDING %   Promyelocytes Absolute PENDING %   Blasts PENDING %  Glucose, capillary     Status: Abnormal   Collection Time: 02/29/16  7:53 AM  Result Value Ref Range  Glucose-Capillary 157 (H) 65 - 99 mg/dL   Comment 1 Notify RN    Comment 2 Document in Chart      Assessment/Plan:   NEURO  Altered Mental Status:  delirium, coma and sedation   Plan: Hold sedation as  Much as possible.  Could be a metabolic encephalopathy  PULM  Atelectasis/collapse (focal and entire right lung seems to be intrapped and opacified) Hemothorax  (retained and no longer draining with chest tube placed by CVTS)   Plan: May need another chest tube or guided drainage.  CARDIO  Sinus Tachycardia   Plan: No specific treatment  RENAL  Polyuria (r/o renal concentrating defect and post ATN diuresis) Actue Renal Failure (acute tubular necrosis)   Plan: Will need to give some free water and D5W  GI  Open abdomen, will try to close tomorrow.   Plan: Continue tube feedings.  NPO after midnight  ID  Empyema (multiple organisms present ofn pleural drainage.)   Plan: CPM.  Will not start any additional antibiotics  HEME  Anemia acute blood loss anemia, anemia of critical illness and anemia of renal disease)   Plan: One unit of blood today.  ENDO No specific issues   Plan: CPM  Global Issues  More lethargic today, may be because of hypernatremia.  Will try to correct.  Venous duplex studies done yesterday, results are unknown    LOS: 20 days   Additional comments:I reviewed the patient's new clinical lab test results. cbc/bmet and I reviewed the patients new imaging test results. cxr  Critical Care Total Time*: 30 Minutes  Stevie Charter 02/29/2016  *Care during the described time interval was provided by me and/or other providers on the critical care team.  I have reviewed this patient's available data, including medical history, events of note, physical examination and test results as part of my evaluation.

## 2016-02-29 NOTE — Progress Notes (Addendum)
      [address]       [place], [id number]             [phone]      3 Days Post-Op Procedure(s) (LRB): CHEST TUBE INSERTION (Right)   Subjective:  Remains sedated on vent  Objective: Vital signs in last 24 hours: Temp:  [98.5 F (36.9 C)-99.9 F (37.7 C)] 99.9 F (37.7 C) (04/23 0754) Pulse Rate:  [80-91] 80 (04/23 1133) Cardiac Rhythm:  [-] Normal sinus rhythm (04/23 0000) Resp:  [17-25] 20 (04/23 1133) BP: (88-118)/(43-70) 102/51 mmHg (04/23 1133) SpO2:  [90 %-100 %] 100 % (04/23 1133) FiO2 (%):  [40 %-50 %] 50 % (04/23 1133) Weight:  [281 lb 1.4 oz (127.5 kg)] 281 lb 1.4 oz (127.5 kg) (04/23 0500)  Intake/Output from previous day: 04/22 0701 - 04/23 0700 In: 1760 [I.V.:270; NG/GT:1490] Out: 4450 [Urine:4300; Drains:150] Intake/Output this shift: Total I/O In: -  Out: 800 [Urine:750; Drains:50]  General appearance: sedated on vent Heart: regular rate and rhythm Lungs: clear to auscultation bilaterally Abdomen: soft, non-tender; bowel sounds normal; no masses,  no organomegaly Wound: clean and dry  Lab Results:  Recent Labs  02/28/16 0500 02/29/16 0507  WBC 12.1* 11.1*  HGB 7.7* 7.6*  HCT 23.8* 23.9*  PLT 328 331   BMET:  Recent Labs  02/28/16 0500 02/29/16 0507  NA 154* 157*  K 3.2* 3.2*  CL 113* 115*  CO2 27 27  GLUCOSE 152* 154*  BUN 197* 206*  CREATININE 2.87* 2.64*  CALCIUM 8.8* 8.9    PT/INR: No results for input(s): LABPROT, INR in the last 72 hours. ABG    Component Value Date/Time   PHART 7.508* 02/28/2016 0642   HCO3 25.5* 02/28/2016 0642   TCO2 26.5 02/28/2016 0642   ACIDBASEDEF 2.0 02/13/2016 0411   O2SAT 99.2 02/28/2016 0642   CBG (last 3)   Recent Labs  02/28/16 2323 02/29/16 0332 02/29/16 0753  GLUCAP 149* 118* 157*    Assessment/Plan: S/P Procedure(s) (LRB): CHEST TUBE INSERTION (Right)  1. Chest tube- minimal output, chest tube in stable position- leave in place 2. Pulm- CXR remains  stable, right sided effusion/atelectasis unchanged 3. Dispo- care per primary, leave chest tube in place today   LOS: 20 days    BARRETT, ERIN 02/29/2016  I have seen and examined the patient and agree with the assessment and plan as outlined.  CXR w/ stable residual opacity right lung field.  Discussed w/ Dr. Lindie SpruceWyatt.  Will order f/u CT chest w/out contrast to evaluate whether this is due to significant residual hemothorax vs pulmonary contusion or both.  I spent in excess of 15 minutes during the conduct of this hospital encounter and >50% of this time involved direct face-to-face encounter with the patient for counseling and/or coordination of their care.    Purcell Nailslarence H Owen, MD 02/29/2016 12:28 PM

## 2016-02-29 NOTE — Progress Notes (Signed)
Pt w/ high RR/agitated.  Placed back on full vent support.

## 2016-02-29 NOTE — Progress Notes (Signed)
"  The Move" done with Verneda SkillElaine Thielen for 30 sec three times, with pre-procedural fentanyl bolus given.

## 2016-02-29 NOTE — Progress Notes (Signed)
"  The Move" done with Tammy RN for 30 sec three times, with pre-procedural fentanyl bolus given.

## 2016-03-01 ENCOUNTER — Inpatient Hospital Stay (HOSPITAL_COMMUNITY): Payer: Medicaid Other | Admitting: Certified Registered"

## 2016-03-01 ENCOUNTER — Encounter (HOSPITAL_COMMUNITY): Admission: EM | Disposition: A | Discharge status: Rehabilitation Facility | Payer: Self-pay | Source: Home / Self Care

## 2016-03-01 ENCOUNTER — Encounter (HOSPITAL_COMMUNITY): Payer: Self-pay | Admitting: Certified Registered"

## 2016-03-01 HISTORY — PX: WOUND DEBRIDEMENT: SHX247

## 2016-03-01 HISTORY — PX: PERCUTANEOUS TRACHEOSTOMY: SHX5288

## 2016-03-01 LAB — TYPE AND SCREEN
ABO/RH(D): O POS
ANTIBODY SCREEN: NEGATIVE
Unit division: 0

## 2016-03-01 LAB — GLUCOSE, CAPILLARY
GLUCOSE-CAPILLARY: 116 mg/dL — AB (ref 65–99)
GLUCOSE-CAPILLARY: 130 mg/dL — AB (ref 65–99)
GLUCOSE-CAPILLARY: 122 mg/dL — AB (ref 65–99)
GLUCOSE-CAPILLARY: 139 mg/dL — AB (ref 65–99)
Glucose-Capillary: 145 mg/dL — ABNORMAL HIGH (ref 65–99)
Glucose-Capillary: 126 mg/dL — ABNORMAL HIGH (ref 65–99)

## 2016-03-01 LAB — BASIC METABOLIC PANEL
ANION GAP: 16 — AB (ref 5–15)
BUN: 183 mg/dL — ABNORMAL HIGH (ref 6–20)
CALCIUM: 8 mg/dL — AB (ref 8.9–10.3)
CO2: 24 mmol/L (ref 22–32)
CREATININE: 2.22 mg/dL — AB (ref 0.44–1.00)
Chloride: 105 mmol/L (ref 101–111)
GFR, EST AFRICAN AMERICAN: 33 mL/min — AB (ref >60)
GFR, EST NON AFRICAN AMERICAN: 28 mL/min — AB (ref >60)
GLUCOSE: 456 mg/dL — AB (ref 65–99)
Potassium: 2.6 mmol/L — CL (ref 3.5–5.1)
Sodium: 145 mmol/L (ref 135–145)

## 2016-03-01 LAB — CBC WITH DIFFERENTIAL/PLATELET
Basophils Absolute: 0.1 10*3/uL (ref 0.0–0.1)
Basophils Relative: 1 %
EOS ABS: 0.4 10*3/uL (ref 0.0–0.7)
Eosinophils Relative: 4 %
HCT: 24.2 % — ABNORMAL LOW (ref 36.0–46.0)
Hemoglobin: 7.6 g/dL — ABNORMAL LOW (ref 12.0–15.0)
LYMPHS ABS: 0.5 10*3/uL — AB (ref 0.7–4.0)
Lymphocytes Relative: 6 %
MCH: 29.7 pg (ref 26.0–34.0)
MCHC: 31.4 g/dL (ref 30.0–36.0)
MCV: 94.5 fL (ref 78.0–100.0)
MONO ABS: 1.5 10*3/uL — AB (ref 0.1–1.0)
Monocytes Relative: 16 %
Neutro Abs: 6.6 10*3/uL (ref 1.7–7.7)
Neutrophils Relative %: 73 %
PLATELETS: 281 10*3/uL (ref 150–400)
RBC: 2.56 MIL/uL — AB (ref 3.87–5.11)
RDW: 24.9 % — AB (ref 11.5–15.5)
WBC Morphology: INCREASED
WBC: 9.1 10*3/uL (ref 4.0–10.5)

## 2016-03-01 LAB — POTASSIUM: POTASSIUM: 4.7 mmol/L (ref 3.5–5.1)

## 2016-03-01 SURGERY — IRRIGATION AND DEBRIDEMENT, WOUND, ABDOMEN, WITH CLOSURE
Anesthesia: General | Site: Neck

## 2016-03-01 MED ORDER — VECURONIUM BROMIDE 10 MG IV SOLR
INTRAVENOUS | Status: AC
  Filled 2016-03-01: qty 10

## 2016-03-01 MED ORDER — VECURONIUM BROMIDE 10 MG IV SOLR
INTRAVENOUS | Status: DC | PRN
  Administered 2016-03-01: 2 mg via INTRAVENOUS
  Administered 2016-03-01: 4 mg via INTRAVENOUS
  Administered 2016-03-01 (×2): 2 mg via INTRAVENOUS

## 2016-03-01 MED ORDER — PROPOFOL 10 MG/ML IV BOLUS
INTRAVENOUS | Status: AC
  Filled 2016-03-01: qty 20

## 2016-03-01 MED ORDER — POTASSIUM CHLORIDE 20 MEQ/15ML (10%) PO SOLN
60.0000 meq | Freq: Once | ORAL | Status: AC
  Administered 2016-03-01: 60 meq via ORAL
  Filled 2016-03-01: qty 45

## 2016-03-01 MED ORDER — ROCURONIUM BROMIDE 100 MG/10ML IV SOLN
INTRAVENOUS | Status: DC | PRN
  Administered 2016-03-01: 50 mg via INTRAVENOUS

## 2016-03-01 MED ORDER — FENTANYL CITRATE (PF) 100 MCG/2ML IJ SOLN
INTRAMUSCULAR | Status: DC | PRN
  Administered 2016-03-01: 150 ug via INTRAVENOUS
  Administered 2016-03-01: 100 ug via INTRAVENOUS

## 2016-03-01 MED ORDER — ROCURONIUM BROMIDE 50 MG/5ML IV SOLN
INTRAVENOUS | Status: AC
  Filled 2016-03-01: qty 1

## 2016-03-01 MED ORDER — LIDOCAINE HCL (CARDIAC) 20 MG/ML IV SOLN
INTRAVENOUS | Status: AC
  Filled 2016-03-01: qty 5

## 2016-03-01 MED ORDER — LACTATED RINGERS IV SOLN
INTRAVENOUS | Status: DC | PRN
Start: 2016-03-01 — End: 2016-03-01
  Administered 2016-03-01: 15:00:00 via INTRAVENOUS

## 2016-03-01 MED ORDER — MIDAZOLAM HCL 2 MG/2ML IJ SOLN
INTRAMUSCULAR | Status: AC
  Filled 2016-03-01: qty 2

## 2016-03-01 MED ORDER — MIDAZOLAM HCL 5 MG/5ML IJ SOLN
INTRAMUSCULAR | Status: DC | PRN
Start: 2016-03-01 — End: 2016-03-01
  Administered 2016-03-01: 2 mg via INTRAVENOUS

## 2016-03-01 MED ORDER — POTASSIUM CHLORIDE 10 MEQ/50ML IV SOLN
10.0000 meq | INTRAVENOUS | Status: AC
  Administered 2016-03-01 (×6): 10 meq via INTRAVENOUS
  Filled 2016-03-01 (×6): qty 50

## 2016-03-01 MED ORDER — FENTANYL CITRATE (PF) 250 MCG/5ML IJ SOLN
INTRAMUSCULAR | Status: AC
  Filled 2016-03-01: qty 5

## 2016-03-01 MED ORDER — 0.9 % SODIUM CHLORIDE (POUR BTL) OPTIME
TOPICAL | Status: DC | PRN
  Administered 2016-03-01 (×2): 1000 mL

## 2016-03-01 MED ORDER — PHENYLEPHRINE HCL 10 MG/ML IJ SOLN
INTRAMUSCULAR | Status: DC | PRN
  Administered 2016-03-01 (×2): 80 ug via INTRAVENOUS
  Administered 2016-03-01: 120 ug via INTRAVENOUS

## 2016-03-01 SURGICAL SUPPLY — 58 items
BENZOIN TINCTURE PRP APPL 2/3 (GAUZE/BANDAGES/DRESSINGS) IMPLANT
CANISTER SUCTION 2500CC (MISCELLANEOUS) ×4 IMPLANT
CANISTER WOUND CARE 500ML ATS (WOUND CARE) ×8 IMPLANT
COVER SURGICAL LIGHT HANDLE (MISCELLANEOUS) ×8 IMPLANT
DRAPE LAPAROSCOPIC ABDOMINAL (DRAPES) ×4 IMPLANT
DRAPE PROXIMA HALF (DRAPES) ×8 IMPLANT
DRAPE UTILITY XL STRL (DRAPES) ×8 IMPLANT
DRSG TEGADERM 2-3/8X2-3/4 SM (GAUZE/BANDAGES/DRESSINGS) ×24 IMPLANT
DRSG VAC ATS LRG SENSATRAC (GAUZE/BANDAGES/DRESSINGS) IMPLANT
DRSG VAC ATS MED SENSATRAC (GAUZE/BANDAGES/DRESSINGS) ×4 IMPLANT
DRSG VERSA FOAM LRG 10X15 (GAUZE/BANDAGES/DRESSINGS) IMPLANT
ELECT CAUTERY BLADE 6.4 (BLADE) ×4 IMPLANT
ELECT REM PT RETURN 9FT ADLT (ELECTROSURGICAL) ×4
ELECTRODE REM PT RTRN 9FT ADLT (ELECTROSURGICAL) ×2 IMPLANT
GAUZE SPONGE 4X4 16PLY XRAY LF (GAUZE/BANDAGES/DRESSINGS) ×4 IMPLANT
GLOVE BIO SURGEON STRL SZ 6.5 (GLOVE) ×3 IMPLANT
GLOVE BIO SURGEON STRL SZ8 (GLOVE) ×4 IMPLANT
GLOVE BIO SURGEONS STRL SZ 6.5 (GLOVE) ×1
GLOVE BIOGEL PI IND STRL 7.0 (GLOVE) ×4 IMPLANT
GLOVE BIOGEL PI IND STRL 8 (GLOVE) ×4 IMPLANT
GLOVE BIOGEL PI INDICATOR 7.0 (GLOVE) ×4
GLOVE BIOGEL PI INDICATOR 8 (GLOVE) ×4
GLOVE ECLIPSE 7.5 STRL STRAW (GLOVE) ×4 IMPLANT
GLOVE SURG SIGNA 7.5 PF LTX (GLOVE) ×4 IMPLANT
GLOVE SURG SS PI 7.0 STRL IVOR (GLOVE) ×4 IMPLANT
GOWN STRL REUS W/ TWL LRG LVL3 (GOWN DISPOSABLE) ×4 IMPLANT
GOWN STRL REUS W/ TWL XL LVL3 (GOWN DISPOSABLE) ×6 IMPLANT
GOWN STRL REUS W/TWL LRG LVL3 (GOWN DISPOSABLE) ×4
GOWN STRL REUS W/TWL XL LVL3 (GOWN DISPOSABLE) ×6
H R LUBE JELLY XXX (MISCELLANEOUS) ×4 IMPLANT
INTRODUCER TRACH BLUE RHINO 6F (TUBING) IMPLANT
INTRODUCER TRACH BLUE RHINO 8F (TUBING) IMPLANT
KIT BASIN OR (CUSTOM PROCEDURE TRAY) ×4 IMPLANT
KIT ROOM TURNOVER OR (KITS) ×4 IMPLANT
NS IRRIG 1000ML POUR BTL (IV SOLUTION) ×4 IMPLANT
PACK GENERAL/GYN (CUSTOM PROCEDURE TRAY) ×4 IMPLANT
PAD ARMBOARD 7.5X6 YLW CONV (MISCELLANEOUS) ×8 IMPLANT
PENCIL BUTTON HOLSTER BLD 10FT (ELECTRODE) ×4 IMPLANT
SOLUTION BETADINE 4OZ (MISCELLANEOUS) ×4 IMPLANT
SPONGE ABDOMINAL VAC ABTHERA (MISCELLANEOUS) ×4 IMPLANT
SPONGE DRAIN TRACH 4X4 STRL 2S (GAUZE/BANDAGES/DRESSINGS) ×4 IMPLANT
SPONGE GAUZE 4X4 12PLY STER LF (GAUZE/BANDAGES/DRESSINGS) ×4 IMPLANT
SPONGE INTESTINAL PEANUT (DISPOSABLE) ×4 IMPLANT
SPONGE LAP 18X18 X RAY DECT (DISPOSABLE) ×4 IMPLANT
SUCTION POOLE TIP (SUCTIONS) IMPLANT
SUT ETHILON 3 0 PS 1 (SUTURE) ×4 IMPLANT
SUT NOVA 1 T20/GS 25DT (SUTURE) ×12 IMPLANT
SUT PDS AB 1 TP1 96 (SUTURE) ×8 IMPLANT
SUT SILK 2 0 SH CR/8 (SUTURE) ×4 IMPLANT
SUT VICRYL AB 3 0 TIES (SUTURE) ×4 IMPLANT
SYR CONTROL 10ML LL (SYRINGE) ×4 IMPLANT
TOWEL OR 17X24 6PK STRL BLUE (TOWEL DISPOSABLE) ×4 IMPLANT
TOWEL OR 17X26 10 PK STRL BLUE (TOWEL DISPOSABLE) ×4 IMPLANT
TUBE CONNECTING 12'X1/4 (SUCTIONS) ×1
TUBE CONNECTING 12X1/4 (SUCTIONS) ×3 IMPLANT
TUBE TRACH SHILEY  6 DIST  CUF (TUBING) ×4 IMPLANT
WATER STERILE IRR 1000ML POUR (IV SOLUTION) IMPLANT
YANKAUER SUCT BULB TIP NO VENT (SUCTIONS) ×4 IMPLANT

## 2016-03-01 NOTE — Progress Notes (Signed)
Patient returned from OR, with tracheostomy placed, and placed on ventilator without complications.

## 2016-03-01 NOTE — Op Note (Signed)
OPERATIVE REPORT  DATE OF OPERATION:  03/01/2016  PATIENT:  [name] Bell  35 y.o. female  PRE-OPERATIVE DIAGNOSIS:  OPEN ABDOMEN, REPIRATORY FAILURE  POST-OPERATIVE DIAGNOSIS:  OPEN ABDOMEN, REPIRATORY FAILURE  FINDINGS:  Bile stained peritoneal fluid.  Easy closure with minimal tension.  PROCEDURE:  Procedure(s): ABDOMINAL WOUND CLOSURE TRACHEOSTOMY  SURGEON:  Surgeon(s): Jimmye NormanJames Fatima Fedie, MD  ASSISTANT: Leotis ShamesJeffery, PA-C and [place], [place]-C  ANESTHESIA:   general  COMPLICATIONS:  None  EBL: <50 ml  BLOOD ADMINISTERED: none  DRAINS: Urinary Catheter (Foley), Gastrostomy Tube and #6 Shiley tracheostomy tube   SPECIMEN:  No Specimen  COUNTS CORRECT:  YES  PROCEDURE DETAILS: The patient was taken directly from the intensive care unit to the operating room. A proper timeout was performed identifying the patient and procedures to be performed. Initial procedure was a tracheostomy.  The neck was prepped and draped in usual sterile manner. He measured approximately 2 cm above the sternal notch. A bedside a transverse incision was made using a #15 blade. We subsequently dissected out per layer down to the pretracheal fascia. The second tracheal ring was identified into stay sutures were looped around the second tracheal ring using a 2-0 silk suture. We subsequently palpated the trachea for the endotracheal tube as it was pulled back proximal to the site of a tracheotomy. An inferior tracheotomy was made using a #15 blade and a tracheal spreader used to enlarge the tracheotomy. A #6 cuffed tracheostomy tube was passed and the tracheotomy and secured in place with a drain sponge underneath. The stay sutures was left in place and secured to itself.  We subsequently turned our attention to the open abdomen. The negative pressure wound dressing was removed. Most of the Ioban and plastic dressing surrounding the capital ABRA device was removed prior to prepping with Betadine.  The Silastic tube  was removed as the Silastic bands were removed from its inner crevices. We then started to close the fascia.  We placed sutures in a figure-of-eight pattern using #1 Novafil. The protective inner Silastic bowel protector was left in place as we placed the sutures. 3 Silastic transverse bands were left in place for retention and stress relief subsequently. The other Silastic bands were cut out and these 3 retention bands left in place were equally space.  Once we had all sutures in place we removed the protective Silastic sheet from the peritoneal cavity. We did irrigate with saline solution approximately 2 L were used. Once the sutures were placed in the Silastic bowel protector was removed, and we tied off the figure-of-eight #1 Novafil sutures down closing the fascia completely. There was minimal tension.  At the base of the Silastic bands which came out of the skin aTegaderm dressing was placed and then the button securing the Silastic tubes in place were placed on each side. Tension was placed on each of the Silastic bands creating tension relief for the fascial closure.  We irrigated the midline wound and we placed a negative pressure wound dressing in place. InterDry cloth was placed around the button securing in the Silastic bands. The adhesive hooks were attached to the button to keep them from migrating over time and to reduce tension. All needle counts, sponge counts, and instrument counts were correct.  PATIENT DISPOSITION:  With a new tracheostomy tube, stable hemodynamically.   Wrenn Willcox 4/24/20175:29 PM

## 2016-03-01 NOTE — Transfer of Care (Signed)
Immediate Anesthesia Transfer of Care Note  Patient: [name] Bell  Procedure(s) Performed: Procedure(s): ABDOMINAL WOUND CLOSURE (N/A) PERCUTANEOUS TRACHEOSTOMY (N/A)  Patient Location: ICU  Anesthesia Type:General  Level of Consciousness: unresponsive and Patient remains intubated per anesthesia plan  Airway & Oxygen Therapy: Patient remains intubated per anesthesia plan and Patient placed on Ventilator (see vital sign flow sheet for setting)  Post-op Assessment: Report given to RN, Post -op Vital signs reviewed and stable and Patient moving all extremities  Post vital signs: Reviewed and stable  Last Vitals:  Filed Vitals:   03/01/16 1300 03/01/16 1400  BP: 135/84 115/83  Pulse: 97 90  Temp:    Resp: 29 20    Complications: No apparent anesthesia complications

## 2016-03-01 NOTE — Progress Notes (Signed)
Patient ID: [name] Bell, female   DOB: 1981-08-14, 35 y.o.   MRN: [id number] Follow up - Trauma Critical Care  Patient Details:    [name] Bell is an 35 y.o. female.  Lines/tubes : Airway 7.5 mm (Active)  Secured at (cm) 24 cm 03/01/2016  3:50 AM  Measured From Lips 03/01/2016  3:50 AM  Secured Location Center 03/01/2016  3:50 AM  Secured By [employer] 03/01/2016  3:50 AM  Tube Holder Repositioned Yes 03/01/2016  3:50 AM  Cuff Pressure (cm H2O) 24 cm H2O 02/29/2016  8:40 AM  Site Condition Dry 03/01/2016  3:50 AM     PICC Triple Lumen 02/15/16 PICC Right Cephalic 55 cm 0 cm (Active)  Indication for Insertion or Continuance of Line Prolonged intravenous therapies 02/29/2016  8:00 PM  Exposed Catheter (cm) 4 cm 02/25/2016  4:00 PM  Site Assessment Clean;Dry;Intact 02/29/2016  8:00 PM  Lumen #1 Status Infusing 03/01/2016  6:00 AM  Lumen #2 Status Infusing 03/01/2016  6:00 AM  Lumen #3 Status Flushed;Capped (Central line);Blood return noted 02/29/2016  8:00 PM  Dressing Type Transparent;Securing device 02/29/2016  8:00 PM  Dressing Status Clean;Dry;Intact;Antimicrobial disc in place 02/29/2016  8:00 PM  Line Care Connections checked and tightened;Line pulled back;Tubing changed 02/29/2016  8:00 PM  Line Adjustment (NICU/IV Team Only) Yes 02/25/2016  4:00 PM  Dressing Intervention Dressing changed;Antimicrobial disc changed 02/25/2016  4:00 PM  Dressing Change Due 03/03/16 02/29/2016  8:00 PM     Chest Tube 1 Right Pleural 36 Fr. (Active)  Suction -20 cm H2O 03/01/2016  4:00 AM  Chest Tube Air Leak None 03/01/2016  4:00 AM  Patency Intervention Milked;Tip/tilt 03/01/2016  4:00 AM  Drainage Description Dark red 03/01/2016  4:00 AM  Dressing Status Clean;Dry;Intact 03/01/2016  4:00 AM  Dressing Intervention Other (Comment) 02/29/2016  8:00 PM  Site Assessment Intact 03/01/2016  4:00 AM  Surrounding Skin Intact 03/01/2016  4:00 AM  Output (mL) 20 mL 03/01/2016  5:08 AM     Negative Pressure  Wound Therapy Abdomen Mid (Active)  Last dressing change 02/27/16 03/01/2016  4:00 AM  Site / Wound Assessment Dressing in place / Unable to assess 03/01/2016  4:00 AM  Peri-wound Assessment Pink 03/01/2016  4:00 AM  Wound filler - Black foam 1 02/27/2016 12:00 PM  Cycle Continuous 03/01/2016  4:00 AM  Target Pressure (mmHg) 125 03/01/2016  4:00 AM  Canister Changed No 03/01/2016  4:00 AM  Dressing Status Intact 03/01/2016  4:00 AM  Drainage Amount Scant 02/28/2016  8:00 AM  Drainage Description Serosanguineous 02/28/2016  8:00 AM  Output (mL) 50 mL 03/01/2016  5:08 AM     Gastrostomy/Enterostomy Gastrostomy 24 Fr. LUQ (Active)  Surrounding Skin Dry;Intact 03/01/2016  4:00 AM  Tube Status Clamped 03/01/2016  4:00 AM  Dressing Status None 03/01/2016  4:00 AM  Output (mL) 100 mL 02/18/2016  6:20 PM     Urethral Catheter S. Bowman,RN Latex 16 Fr. (Active)  Indication for Insertion or Continuance of Catheter Aggressive IV diuresis;Peri-operative use for selective surgical procedure 02/29/2016  8:00 PM  Site Assessment Intact;Swelling 02/29/2016  8:00 PM  Catheter Maintenance Bag below level of bladder;Drainage bag/tubing not touching floor;Catheter secured;Insertion date on drainage bag;No dependent loops;Seal intact 02/29/2016  8:00 PM  Collection Container Standard drainage bag 02/29/2016  8:00 PM  Securement Method Securing device (Describe) 02/29/2016  8:00 PM  Urinary Catheter Interventions Unclamped 02/29/2016  8:00 PM  Output (mL) 240 mL 03/01/2016  8:14 AM  Microbiology/Sepsis markers: Results for orders placed or performed during the hospital encounter of 02/09/16  MRSA PCR Screening     Status: None   Collection Time: 02/11/16 11:30 AM  Result Value Ref Range Status   MRSA by PCR NEGATIVE NEGATIVE Final    Comment:        The GeneXpert MRSA Assay (FDA approved for NASAL specimens only), is one component of a comprehensive MRSA colonization surveillance program. It is not intended to  diagnose MRSA infection nor to guide or monitor treatment for MRSA infections.   Anaerobic culture     Status: None (Preliminary result)   Collection Time: 02/26/16  1:19 PM  Result Value Ref Range Status   Specimen Description FLUID PLEURAL RIGHT  Final   Special Requests POF ANCEF  Final   Gram Stain   Final    DEGENERATED CELLULAR MATERIAL PRESENT NO ORGANISMS SEEN    Culture   Final    NO ANAEROBES ISOLATED; CULTURE IN PROGRESS FOR 5 DAYS   Report Status PENDING  Incomplete  Body fluid culture     Status: None   Collection Time: 02/26/16  1:19 PM  Result Value Ref Range Status   Specimen Description FLUID RIGHT PLEURAL  Final   Special Requests POF ANCEF  Final   Gram Stain   Final    DEGENERATED CELLULAR MATERIAL PRESENT NO ORGANISMS SEEN    Culture   Final    MULTIPLE ORGANISMS PRESENT, NONE PREDOMINANT CRITICAL RESULT CALLED TO, READ BACK BY AND VERIFIED WITH: J PINEDA 02/27/16 @ 1314 M VESTAL    Report Status 02/28/2016 FINAL  Final    Anti-infectives:  Anti-infectives    Start     Dose/Rate Route Frequency Ordered Stop   03/01/16 1330  ceFAZolin (ANCEF) IVPB 2g/100 mL premix     2 g 200 mL/hr over 30 Minutes Intravenous To [facility] 02/29/16 0917 03/02/16 1330   02/11/16 0600  piperacillin-tazobactam (ZOSYN) IVPB 3.375 g  Status:  Discontinued     3.375 g 100 mL/hr over 30 Minutes Intravenous 4 times per day 02/10/16 2214 02/22/16 0853   02/10/16 1730  piperacillin-tazobactam (ZOSYN) IVPB 3.375 g  Status:  Discontinued     3.375 g 12.5 mL/hr over 240 Minutes Intravenous 3 times per day 02/10/16 1727 02/10/16 2214      Best Practice/Protocols:  VTE Prophylaxis: Lovenox (prophylaxtic dose) Intermittent Sedation  Consults: Treatment Team:  Myrene Galas, MD Annie Sable, MD Kerin Perna, MD    Studies:    Events:  Subjective:    Overnight Issues:   Objective:  Vital signs for last 24 hours: Temp:  [98.3 F (36.8 C)-99.1  F (37.3 C)] 98.3 F (36.8 C) (04/24 0338) Pulse Rate:  [77-96] 96 (04/24 0700) Resp:  [10-25] 22 (04/24 0700) BP: (88-142)/(43-78) 142/77 mmHg (04/24 0700) SpO2:  [97 %-100 %] 100 % (04/24 0700) FiO2 (%):  [40 %-50 %] 40 % (04/24 0600) Weight:  [126.1 kg (278 lb)] 126.1 kg (278 lb) (04/24 0600)  Hemodynamic parameters for last 24 hours:    Intake/Output from previous day: 04/23 0701 - 04/24 0700 In: 3598.6 [I.V.:1848.6; Blood:300; NG/GT:1400; IV Piggyback:50] Out: 4370 [Urine:4100; Drains:250; Chest Tube:20]  Intake/Output this shift: Total I/O In: 10.5 [I.V.:10.5] Out: 240 [Urine:240]  Vent settings for last 24 hours: Vent Mode:  [-] PRVC FiO2 (%):  [40 %-50 %] 40 % Set Rate:  [20 bmp] 20 bmp Vt Set:  [490 mL] 490 mL PEEP:  [5 cmH20] 5  cmH20 Pressure Support:  [15 cmH20] 15 cmH20 Plateau Pressure:  [23 cmH20-30 cmH20] 24 cmH20  Physical Exam:  General: on vent Neuro: arouses, inconsistent F/C HEENT/Neck: ETT Resp: rhonchi bilaterally CVS: RRR GI: ABRA with VAC in place, G tube Extremities: edema 3+  Results for orders placed or performed during the hospital encounter of 02/09/16 (from the past 24 hour(s))  Prepare RBC     Status: None   Collection Time: 02/29/16  9:17 AM  Result Value Ref Range   Order Confirmation ORDER PROCESSED BY BLOOD BANK   Type and screen [facility]     Status: None (Preliminary result)   Collection Time: 02/29/16 11:00 AM  Result Value Ref Range   ABO/RH(D) O POS    Antibody Screen NEG    Sample Expiration 03/03/2016    Unit Number [id number]    Blood Component Type RED CELLS,LR    Unit division 00    Status of Unit ISSUED    Transfusion Status OK TO TRANSFUSE    Crossmatch Result Compatible   Glucose, capillary     Status: Abnormal   Collection Time: 02/29/16 11:04 AM  Result Value Ref Range   Glucose-Capillary 125 (H) 65 - 99 mg/dL  Glucose, capillary     Status: Abnormal   Collection Time: 02/29/16  5:30  PM  Result Value Ref Range   Glucose-Capillary 151 (H) 65 - 99 mg/dL  Glucose, capillary     Status: Abnormal   Collection Time: 02/29/16  7:32 PM  Result Value Ref Range   Glucose-Capillary 138 (H) 65 - 99 mg/dL  Glucose, capillary     Status: Abnormal   Collection Time: 02/29/16 11:23 PM  Result Value Ref Range   Glucose-Capillary 141 (H) 65 - 99 mg/dL  Glucose, capillary     Status: Abnormal   Collection Time: 03/01/16  3:21 AM  Result Value Ref Range   Glucose-Capillary 116 (H) 65 - 99 mg/dL  CBC with Differential/Platelet     Status: Abnormal   Collection Time: 03/01/16  4:50 AM  Result Value Ref Range   WBC 9.1 4.0 - 10.5 K/uL   RBC 2.56 (L) 3.87 - 5.11 MIL/uL   Hemoglobin 7.6 (L) 12.0 - 15.0 g/dL   HCT 11.9 (L) 14.7 - 82.9 %   MCV 94.5 78.0 - 100.0 fL   MCH 29.7 26.0 - 34.0 pg   MCHC 31.4 30.0 - 36.0 g/dL   RDW 56.2 (H) 13.0 - 86.5 %   Platelets 281 150 - 400 K/uL   Neutrophils Relative % 73 %   Lymphocytes Relative 6 %   Monocytes Relative 16 %   Eosinophils Relative 4 %   Basophils Relative 1 %   Neutro Abs 6.6 1.7 - 7.7 K/uL   Lymphs Abs 0.5 (L) 0.7 - 4.0 K/uL   Monocytes Absolute 1.5 (H) 0.1 - 1.0 K/uL   Eosinophils Absolute 0.4 0.0 - 0.7 K/uL   Basophils Absolute 0.1 0.0 - 0.1 K/uL   RBC Morphology POLYCHROMASIA PRESENT    WBC Morphology INCREASED BANDS (>20% BANDS)   Basic metabolic panel     Status: Abnormal   Collection Time: 03/01/16  4:50 AM  Result Value Ref Range   Sodium 145 135 - 145 mmol/L   Potassium 2.6 (LL) 3.5 - 5.1 mmol/L   Chloride 105 101 - 111 mmol/L   CO2 24 22 - 32 mmol/L   Glucose, Bld 456 (H) 65 - 99 mg/dL   BUN 784 (H)  6 - 20 mg/dL   Creatinine, Ser 1.612.22 (H) 0.44 - 1.00 mg/dL   Calcium 8.0 (L) 8.9 - 10.3 mg/dL   GFR calc non Af Amer 28 (L) >60 mL/min   GFR calc Af Amer 33 (L) >60 mL/min   Anion gap 16 (H) 5 - 15    Assessment & Plan: Present on Admission:  . Fall from, out of or through building, not otherwise specified,  initial encounter . Hypovolemic shock (HCC)   LOS: 21 days   Additional comments:I reviewed the patient's new clinical lab test results. . 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  Resp failure - on valium to help weaning Mult B rib FX/R HPTX, B pulm contusions - Rt CT placed 4/20 by TCTS, CT chest shows residual R effusion as well as LLL consolidation Multiple toxic ingestions Mult L wrist FXs - splint for now Pelvic FX - S/P ex fix and angioembolization, S/P sacral screw by Dr. Carola FrostHandy. Plan ex fix as definitive treatment and Dr. Doren CustardWatch sites for pressure ulcers  Hepatic failure - Bili remains elevated - reabsorbtion of hematoma plus toxic ingestions. F/U LFTs in AM AKI - appreciate renal F/U, CRT down again, U/O 4100 ABL anemia - stable Hyperglycemia - SSI ID - afeb, WBC now WNL FEN - Hypernatremia improved. Continue with free water.  Replace K.   VTE - SCD's, resume Lovenox after surgery Dispo - ICU I spoke with her sister at the bedside. Critical Care Total Time*: 36 Minutes  [name] Ananda Caya, MD, MPH, [facility] IncFACS Trauma: [phone] General Surgery: [phone]  03/01/2016  *Care during the described time interval was provided by me. I have reviewed this patient's available data, including medical history, events of note, physical examination and test results as part of my evaluation.

## 2016-03-01 NOTE — Progress Notes (Signed)
CRITICAL VALUE ALERT  Critical value received:  K 2.6   Date of notification:  03/01/2016  Time of notification:  0549  Critical value read back:Yes.    Nurse who received alert:  Alphia Mohevon Olesya Wike, RN  MD notified (1st page):  Trauma MD Donell BeersByerly  Time of first page: [phone]  Responding MD:  Trauma MD Donell BeersByerly  Time MD responded:  0600

## 2016-03-01 NOTE — Anesthesia Preprocedure Evaluation (Signed)
Anesthesia Evaluation  Patient identified by MRN, date of birth, ID band Patient unresponsive    Reviewed: Allergy & Precautions, H&P , NPO status , Patient's Chart, lab work & pertinent test results  Airway Mallampati: Intubated       Dental no notable dental hx. (+) Teeth Intact, Dental Advisory Given   Pulmonary  Intubated on vent   Pulmonary exam normal breath sounds clear to auscultation       Cardiovascular negative cardio ROS   Rhythm:Regular Rate:Normal     Neuro/Psych negative neurological ROS  negative psych ROS   GI/Hepatic negative GI ROS, Neg liver ROS,   Endo/Other  negative endocrine ROSMorbid obesity  Renal/GU negative Renal ROS  negative genitourinary   Musculoskeletal   Abdominal   Peds  Hematology negative hematology ROS (+)   Anesthesia Other Findings   Reproductive/Obstetrics negative OB ROS                             Anesthesia Physical  Anesthesia Plan  ASA: IV  Anesthesia Plan: General   Post-op Pain Management:    Induction: Intravenous  Airway Management Planned: Oral ETT  Additional Equipment:   Intra-op Plan:   Post-operative Plan: Post-operative intubation/ventilation  Informed Consent: I have reviewed the patients History and Physical, chart, labs and discussed the procedure including the risks, benefits and alternatives for the proposed anesthesia with the patient or authorized representative who has indicated his/her understanding and acceptance.   Dental advisory given  Plan Discussed with: CRNA, Anesthesiologist and Surgeon  Anesthesia Plan Comments:         Anesthesia Quick Evaluation

## 2016-03-01 NOTE — Progress Notes (Signed)
      [address]       [place] [id number]             [phone]      4 Days Post-Op Procedure(s) (LRB): CHEST TUBE INSERTION (Right)   Subjective:  Remains on vent  Objective: Vital signs in last 24 hours: Temp:  [98.3 F (36.8 C)-99.1 F (37.3 C)] 98.5 F (36.9 C) (04/24 0800) Pulse Rate:  [77-96] 91 (04/24 0900) Cardiac Rhythm:  [-] Normal sinus rhythm (04/24 0800) Resp:  [10-29] 23 (04/24 0900) BP: (88-142)/(43-78) 111/60 mmHg (04/24 0900) SpO2:  [97 %-100 %] 100 % (04/24 0900) FiO2 (%):  [40 %-50 %] 40 % (04/24 0800) Weight:  [278 lb (126.1 kg)] 278 lb (126.1 kg) (04/24 0600)  Intake/Output from previous day: 04/23 0701 - 04/24 0700 In: 3598.6 [I.V.:1848.6; Blood:300; NG/GT:1400; IV Piggyback:50] Out: 4370 [Urine:4100; Drains:250; Chest Tube:20] Intake/Output this shift: Total I/O In: 175.3 [I.V.:25.3; IV Piggyback:150] Out: 240 [Urine:240]  General appearance: on vent Heart: regular rate and rhythm Lungs: clear to auscultation bilaterally Wound: clean andd ry  Lab Results:  Recent Labs  02/29/16 0507 03/01/16 0450  WBC 11.1* 9.1  HGB 7.6* 7.6*  HCT 23.9* 24.2*  PLT 331 281   BMET:  Recent Labs  02/29/16 0507 03/01/16 0450  NA 157* 145  K 3.2* 2.6*  CL 115* 105  CO2 27 24  GLUCOSE 154* 456*  BUN 206* 183*  CREATININE 2.64* 2.22*  CALCIUM 8.9 8.0*    PT/INR: No results for input(s): LABPROT, INR in the last 72 hours. ABG    Component Value Date/Time   PHART 7.508* 02/28/2016 0642   HCO3 25.5* 02/28/2016 0642   TCO2 26.5 02/28/2016 0642   ACIDBASEDEF 2.0 02/13/2016 0411   O2SAT 99.2 02/28/2016 0642   CBG (last 3)   Recent Labs  02/29/16 2323 03/01/16 0321 03/01/16 0802  GLUCAP 141* 116* 145*    Assessment/Plan: S/P Procedure(s) (LRB): CHEST TUBE INSERTION (Right)  1. Chest tube- output remains minimal- leave chest tube in place for now 2. Pulm- CT scan done shows right pleural effusion with  consolidation  3. Dispo- care per primary, leave chest tube in place for now   LOS: 21 days    [name] Bell 03/01/2016

## 2016-03-01 NOTE — Clinical Social Work Note (Signed)
Clinical Social Worker continuing to follow patient and family for support. Patient remains on the ventilator with plans to go to the OR today for trach placement and closure of abdomen.CSW remains available for support and will follow up with psych CSW once patient is able to wean off the ventilator and participate in programming.   Macario GoldsJesse Harlo Jaso, KentuckyLCSW [phone]

## 2016-03-01 NOTE — Progress Notes (Signed)
Nutrition Follow-up  DOCUMENTATION CODES:   Obesity unspecified  INTERVENTION:   Continue: Pivot 1.5 @ 30 ml/hr 60 ml Prostat TID Provides: 1680 kcal, 157 grams protein, and 546 ml H2O.  Total free water: 2946 ml  NUTRITION DIAGNOSIS:   Increased nutrient needs related to wound healing, catabolic illness as evidenced by estimated needs. Ongoing.   GOAL:   Provide needs based on ASPEN/SCCM guidelines Met.   MONITOR:   Skin, I & O's, Vent status, Labs, TF tolerance  ASSESSMENT:   Pt from [place] with no past medical hx admitted after suicide attempt by ingestion of Drano, 2 bottles of acetaminophen, nyquil, and possibly 6 alprazolam pills and jumping from 5th floor balcony of hotel. 4/3 pt is s/p exp lap, hepatorraphy, SBR, cholecystectomy, preperitoneal pelvic packing, ex fix pelvis, R HPTX, Bil pulmonary contusions. Plan for repeat OR 4/5 for another possible SBR. Abdomen remains open.   4/7 s/p exp lap, abd VAC dressing change 4/7 TF started 4/9 Pivot 1.5 reached goal of 30 ml/hr, prostat added 60 ml TID 4/10 repeat OR with washout, abdomen left open with negative pressure wound dressing (VAC), sacral screws 4/12 PEG, ABRA placed 4/14 screw fixation at S1/S2 4/20 chest tube placed  Patient is currently intubated on ventilator support MV: 9.7 L/min Temp (24hrs), Avg:98.5 F (36.9 C), Min:98.3 F (36.8 C), Max:98.8 F (37.1 C)  Medications reviewed and include: dulcolax Free water: 400 ml every 4 hours = 2400 ml Labs reviewed: potassium low 2.6 CBG's: 116-145  Diet Order:  Diet NPO time specified  Skin:  Wound (see comment) (open abd with VAC and ABRA device, stage II hip, surg incisi)  Last BM:  4/23  Height:   Ht Readings from Last 1 Encounters:  02/09/16 5' 11"  (1.803 m)    Weight:   Wt Readings from Last 1 Encounters:  03/01/16 278 lb (126.1 kg)    Ideal Body Weight:  70.4 kg  BMI:  Body mass index is 38.79 kg/(m^2).  Estimated Nutritional  Needs:   Kcal:  4268-3419  Protein:  >/= 140 grams  Fluid:  > 2 L/day  EDUCATION NEEDS:   No education needs identified at this time  [place], [place], [place] Pager [phone] After Hours Pager

## 2016-03-02 ENCOUNTER — Inpatient Hospital Stay (HOSPITAL_COMMUNITY): Payer: Medicaid Other

## 2016-03-02 ENCOUNTER — Encounter (HOSPITAL_COMMUNITY): Payer: Self-pay | Admitting: General Surgery

## 2016-03-02 LAB — HEPATIC FUNCTION PANEL
ALT: 100 U/L — AB (ref 14–54)
AST: 216 U/L — AB (ref 15–41)
Albumin: 1.5 g/dL — ABNORMAL LOW (ref 3.5–5.0)
Alkaline Phosphatase: 89 U/L (ref 38–126)
BILIRUBIN DIRECT: 5.6 mg/dL — AB (ref 0.1–0.5)
Indirect Bilirubin: 4.7 mg/dL — ABNORMAL HIGH (ref 0.3–0.9)
TOTAL PROTEIN: 6.1 g/dL — AB (ref 6.5–8.1)
Total Bilirubin: 10.3 mg/dL — ABNORMAL HIGH (ref 0.3–1.2)

## 2016-03-02 LAB — BASIC METABOLIC PANEL
ANION GAP: 13 (ref 5–15)
BUN: 181 mg/dL — ABNORMAL HIGH (ref 6–20)
CO2: 27 mmol/L (ref 22–32)
Calcium: 8.7 mg/dL — ABNORMAL LOW (ref 8.9–10.3)
Chloride: 115 mmol/L — ABNORMAL HIGH (ref 101–111)
Creatinine, Ser: 2.1 mg/dL — ABNORMAL HIGH (ref 0.44–1.00)
GFR calc Af Amer: 35 mL/min — ABNORMAL LOW (ref >60)
GFR, EST NON AFRICAN AMERICAN: 30 mL/min — AB (ref >60)
GLUCOSE: 163 mg/dL — AB (ref 65–99)
POTASSIUM: 3.1 mmol/L — AB (ref 3.5–5.1)
Sodium: 155 mmol/L — ABNORMAL HIGH (ref 135–145)

## 2016-03-02 LAB — GLUCOSE, CAPILLARY
GLUCOSE-CAPILLARY: 129 mg/dL — AB (ref 65–99)
GLUCOSE-CAPILLARY: 147 mg/dL — AB (ref 65–99)
Glucose-Capillary: 135 mg/dL — ABNORMAL HIGH (ref 65–99)
Glucose-Capillary: 159 mg/dL — ABNORMAL HIGH (ref 65–99)
Glucose-Capillary: 148 mg/dL — ABNORMAL HIGH (ref 65–99)
Glucose-Capillary: 187 mg/dL — ABNORMAL HIGH (ref 65–99)

## 2016-03-02 LAB — CBC
HEMATOCRIT: 27.2 % — AB (ref 36.0–46.0)
Hemoglobin: 8.5 g/dL — ABNORMAL LOW (ref 12.0–15.0)
MCH: 29.9 pg (ref 26.0–34.0)
MCHC: 31.3 g/dL (ref 30.0–36.0)
MCV: 95.8 fL (ref 78.0–100.0)
PLATELETS: 304 10*3/uL (ref 150–400)
RBC: 2.84 MIL/uL — AB (ref 3.87–5.11)
RDW: 25.6 % — ABNORMAL HIGH (ref 11.5–15.5)
WBC: 12.4 10*3/uL — AB (ref 4.0–10.5)

## 2016-03-02 LAB — ANAEROBIC CULTURE

## 2016-03-02 MED ORDER — ALTEPLASE 2 MG IJ SOLR
2.0000 mg | Freq: Once | INTRAMUSCULAR | Status: DC
  Filled 2016-03-02: qty 2

## 2016-03-02 NOTE — Progress Notes (Addendum)
Patient ID: [name] Bell, female   DOB: 11/14/1980, 35 y.o.   MRN: [id number] Follow up - Trauma Critical Care  Patient Details:    [name] Bell is an 35 y.o. female.  Lines/tubes : PICC Triple Lumen 02/15/16 PICC Right Cephalic 55 cm 0 cm (Active)  Indication for Insertion or Continuance of Line Prolonged intravenous therapies 03/02/2016  4:00 AM  Exposed Catheter (cm) 4 cm 02/25/2016  4:00 PM  Site Assessment Clean;Dry;Intact 03/02/2016  5:36 AM  Lumen #1 Status Infusing 03/02/2016  9:00 AM  Lumen #2 Status No blood return;Other (Comment) 03/02/2016  9:00 AM  Lumen #3 Status Occluded 03/02/2016  9:00 AM  Dressing Type Transparent;Securing device 03/02/2016  4:00 AM  Dressing Status Clean;Dry;Intact;Antimicrobial disc in place 03/02/2016  4:00 AM  Line Care Connections checked and tightened;Line pulled back;Tubing changed 03/01/2016  8:00 PM  Line Adjustment (NICU/IV Team Only) Yes 02/25/2016  4:00 PM  Dressing Intervention Dressing changed;Antimicrobial disc changed 03/02/2016  5:36 AM  Dressing Change Due 03/09/16 03/02/2016  5:36 AM     Chest Tube 1 Right Pleural 36 Fr. (Active)  Suction -20 cm H2O 03/02/2016  4:00 AM  Chest Tube Air Leak None 03/02/2016  4:00 AM  Patency Intervention Milked;Tip/tilt 03/02/2016  4:00 AM  Drainage Description Dark red 03/02/2016  4:00 AM  Dressing Status Clean 03/02/2016  4:00 AM  Dressing Intervention New dressing 03/01/2016  8:00 PM  Site Assessment Intact 03/01/2016  8:00 PM  Surrounding Skin Intact 03/01/2016  8:00 PM  Output (mL) 30 mL 03/02/2016  6:44 AM     Negative Pressure Wound Therapy Abdomen Mid (Active)  Last dressing change 03/01/16 03/02/2016  4:00 AM  Site / Wound Assessment Dressing in place / Unable to assess 03/02/2016  4:00 AM  Peri-wound Assessment Intact 03/02/2016  4:00 AM  Cycle Continuous 03/02/2016  4:00 AM  Target Pressure (mmHg) 125 03/02/2016  4:00 AM  Canister Changed No 03/02/2016  4:00 AM  Dressing Status Intact 03/02/2016  4:00 AM   Drainage Amount None 03/02/2016  4:00 AM  Output (mL) 50 mL 03/02/2016  6:44 AM     Gastrostomy/Enterostomy Gastrostomy 24 Fr. LUQ (Active)  Surrounding Skin Dry;Intact 03/02/2016  4:00 AM  Tube Status Patent 03/02/2016  4:00 AM  Dressing Status None 03/02/2016  4:00 AM  Output (mL) 100 mL 02/18/2016  6:20 PM     Urethral Catheter S. Bowman,RN Latex 16 Fr. (Active)  Indication for Insertion or Continuance of Catheter Aggressive IV diuresis;Peri-operative use for selective surgical procedure 03/01/2016  8:00 PM  Site Assessment Intact;Swelling 03/01/2016  8:00 PM  Catheter Maintenance Bag below level of bladder;Catheter secured;Drainage bag/tubing not touching floor;Insertion date on drainage bag;No dependent loops;Seal intact 03/01/2016  8:00 PM  Collection Container Standard drainage bag 03/01/2016  8:00 PM  Securement Method Securing device (Describe) 03/01/2016  8:00 PM  Urinary Catheter Interventions Unclamped 03/01/2016  8:00 PM  Output (mL) 130 mL 03/02/2016  8:00 AM    Microbiology/Sepsis markers: Results for orders placed or performed during the hospital encounter of 02/09/16  MRSA PCR Screening     Status: None   Collection Time: 02/11/16 11:30 AM  Result Value Ref Range Status   MRSA by PCR NEGATIVE NEGATIVE Final    Comment:        The GeneXpert MRSA Assay (FDA approved for NASAL specimens only), is one component of a comprehensive MRSA colonization surveillance program. It is not intended to diagnose MRSA infection nor to guide or monitor  treatment for MRSA infections.   Anaerobic culture     Status: None   Collection Time: 02/26/16  1:19 PM  Result Value Ref Range Status   Specimen Description FLUID PLEURAL RIGHT  Final   Special Requests POF ANCEF  Final   Gram Stain   Final    DEGENERATED CELLULAR MATERIAL PRESENT NO ORGANISMS SEEN    Culture NO ANAEROBES ISOLATED  Final   Report Status 03/02/2016 FINAL  Final  Body fluid culture     Status: None   Collection Time:  02/26/16  1:19 PM  Result Value Ref Range Status   Specimen Description FLUID RIGHT PLEURAL  Final   Special Requests POF ANCEF  Final   Gram Stain   Final    DEGENERATED CELLULAR MATERIAL PRESENT NO ORGANISMS SEEN    Culture   Final    MULTIPLE ORGANISMS PRESENT, NONE PREDOMINANT CRITICAL RESULT CALLED TO, READ BACK BY AND VERIFIED WITH: J PINEDA 02/27/16 @ 1314 M VESTAL    Report Status 02/28/2016 FINAL  Final    Anti-infectives:  Anti-infectives    Start     Dose/Rate Route Frequency Ordered Stop   03/01/16 1330  ceFAZolin (ANCEF) IVPB 2g/100 mL premix     2 g 200 mL/hr over 30 Minutes Intravenous To [facility] 02/29/16 0917 03/01/16 1452   02/11/16 0600  piperacillin-tazobactam (ZOSYN) IVPB 3.375 g  Status:  Discontinued     3.375 g 100 mL/hr over 30 Minutes Intravenous 4 times per day 02/10/16 2214 02/22/16 0853   02/10/16 1730  piperacillin-tazobactam (ZOSYN) IVPB 3.375 g  Status:  Discontinued     3.375 g 12.5 mL/hr over 240 Minutes Intravenous 3 times per day 02/10/16 1727 02/10/16 2214      Best Practice/Protocols:  VTE Prophylaxis: Lovenox (prophylaxtic dose) Intermittent Sedation  Consults: Treatment Team:  Myrene Galas, MD Annie Sable, MD Kerin Perna, MD    Studies:CXR - R effusion, CT has pulled back  Subjective:    Overnight Issues:  none Objective:  Vital signs for last 24 hours: Temp:  [97.3 F (36.3 C)-98.7 F (37.1 C)] 97.3 F (36.3 C) (04/25 0800) Pulse Rate:  [78-100] 95 (04/25 0900) Resp:  [0-29] 6 (04/25 0900) BP: (97-143)/(49-99) 114/53 mmHg (04/25 0900) SpO2:  [91 %-100 %] 91 % (04/25 0900) FiO2 (%):  [28 %-40 %] 28 % (04/25 0800)  Hemodynamic parameters for last 24 hours:    Intake/Output from previous day: 04/24 0701 - 04/25 0700 In: 3791.6 [I.V.:1959.1; NG/GT:1582.5; IV Piggyback:250] Out: 5835 [Urine:5700; Drains:50; Blood:25; Chest Tube:60]  Intake/Output this shift: Total I/O In: 217.5 [I.V.:157.5;  NG/GT:60] Out: 130 [Urine:130]  Vent settings for last 24 hours: Vent Mode:  [-] PRVC FiO2 (%):  [28 %-40 %] 28 % Set Rate:  [20 bmp] 20 bmp Vt Set:  [490 mL] 490 mL PEEP:  [5 cmH20] 5 cmH20 Plateau Pressure:  [25 cmH20-29 cmH20] 29 cmH20  Physical Exam:  General: on HTC Neuro: F/C HEENT/Neck: trach-clean, intact Resp: clear to auscultation bilaterally CVS: RRR GI: soft, skin VAC with ABRA retentions Extremities: edema 2+  Results for orders placed or performed during the hospital encounter of 02/09/16 (from the past 24 hour(s))  Glucose, capillary     Status: Abnormal   Collection Time: 03/01/16 11:44 AM  Result Value Ref Range   Glucose-Capillary 126 (H) 65 - 99 mg/dL   Comment 1 Notify RN    Comment 2 Document in Chart   Glucose, capillary  Status: Abnormal   Collection Time: 03/01/16  5:41 PM  Result Value Ref Range   Glucose-Capillary 130 (H) 65 - 99 mg/dL   Comment 1 Notify RN    Comment 2 Document in Chart   Potassium     Status: None   Collection Time: 03/01/16  6:15 PM  Result Value Ref Range   Potassium 4.7 3.5 - 5.1 mmol/L  Glucose, capillary     Status: Abnormal   Collection Time: 03/01/16  7:51 PM  Result Value Ref Range   Glucose-Capillary 122 (H) 65 - 99 mg/dL  Glucose, capillary     Status: Abnormal   Collection Time: 03/01/16 11:18 PM  Result Value Ref Range   Glucose-Capillary 139 (H) 65 - 99 mg/dL  Glucose, capillary     Status: Abnormal   Collection Time: 03/02/16  3:23 AM  Result Value Ref Range   Glucose-Capillary 129 (H) 65 - 99 mg/dL  Glucose, capillary     Status: Abnormal   Collection Time: 03/02/16  8:07 AM  Result Value Ref Range   Glucose-Capillary 147 (H) 65 - 99 mg/dL    Assessment & Plan: Present on Admission:  . Fall from, out of or through building, not otherwise specified, initial encounter . Hypovolemic shock (HCC)   LOS: 22 days   Additional comments:I reviewed the patient's new clinical lab test results. . 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  Resp failure - on HTC now, continue weaning Mult B rib FX/R HPTX, B pulm contusions - Rt CT placed 4/20 by T[facility], CT chest shows residual R effusion, CT pulled back. Continue CT per [facility] Multiple toxic ingestions Mult L wrist FXs - splint for now Pelvic FX - S/P ex fix and angioembolization, S/P sacral screw by Dr. Carola FrostHandy. Plan ex fix as definitive treatment and Dr. Doren CustardWatch sites for pressure ulcers  Hepatic failure - labs pending AKI - labs pending, U/O 5525 yesterday ABL anemia - stable Hyperglycemia - SSI ID - afeb FEN - labs P VTE - SCD's, resume Lovenox after surgery Dispo - ICU Critical Care Total Time*: 30 Minutes  [name] Madasyn Heath, MD, MPH, FACS Trauma: [phone] General Surgery: [phone]  03/02/2016  *Care during the described time interval was provided by me. I have reviewed this patient's available data, including medical history, events of note, physical examination and test results as part of my evaluation.

## 2016-03-02 NOTE — Progress Notes (Signed)
1 Day Post-Op Procedure(s) (LRB): ABDOMINAL WOUND CLOSURE (N/A) PERCUTANEOUS TRACHEOSTOMY (N/A) Subjective: Minimal chest tube output Patient had trach yesterday Pleural fluid with multiple organisms but afebrile with normal WBC Will change chest dressing today and leave tube- wouldnot inject thrombolytic into chest tube due to risk of systemic bleeding wouldnot do VATS to remove pleural hematoma Leave tube for now Objective: Vital signs in last 24 hours: Temp:  [98.4 F (36.9 C)-98.7 F (37.1 C)] 98.4 F (36.9 C) (04/25 0400) Pulse Rate:  [78-100] 85 (04/25 0700) Cardiac Rhythm:  [-] Normal sinus rhythm (04/25 0400) Resp:  [0-29] 22 (04/25 0700) BP: (97-143)/(49-99) 107/65 mmHg (04/25 0700) SpO2:  [95 %-100 %] 95 % (04/25 0700) FiO2 (%):  [40 %] 40 % (04/25 0700)  Hemodynamic parameters for last 24 hours:  stable  Intake/Output from previous day: 04/24 0701 - 04/25 0700 In: 3791.6 [I.V.:1959.1; WU/JW:1191.4G/GT:1582.5; IV Piggyback:250] Out: 5835 [Urine:5700; Drains:50; Blood:25; Chest Tube:60] Intake/Output this shift:      Lab Results:  Recent Labs  02/29/16 0507 03/01/16 0450  WBC 11.1* 9.1  HGB 7.6* 7.6*  HCT 23.9* 24.2*  PLT 331 281   BMET:  Recent Labs  02/29/16 0507 03/01/16 0450 03/01/16 1815  NA 157* 145  --   K 3.2* 2.6* 4.7  CL 115* 105  --   CO2 27 24  --   GLUCOSE 154* 456*  --   BUN 206* 183*  --   CREATININE 2.64* 2.22*  --   CALCIUM 8.9 8.0*  --     PT/INR: No results for input(s): LABPROT, INR in the last 72 hours. ABG    Component Value Date/Time   PHART 7.508* 02/28/2016 0642   HCO3 25.5* 02/28/2016 0642   TCO2 26.5 02/28/2016 0642   ACIDBASEDEF 2.0 02/13/2016 0411   O2SAT 99.2 02/28/2016 0642   CBG (last 3)   Recent Labs  03/01/16 1951 03/01/16 2318 03/02/16 0323  GLUCAP 122* 139* 129*    Assessment/Plan: S/P Procedure(s) (LRB): ABDOMINAL WOUND CLOSURE (N/A) PERCUTANEOUS TRACHEOSTOMY (N/A) Cont chest tube drain, vent  wean   LOS: 22 days    [name] Bell 03/02/2016

## 2016-03-02 NOTE — Progress Notes (Signed)
Patient placed on 28% trach collar.  Patient is currently tolerating well.  Will continue to monitor.

## 2016-03-02 NOTE — Progress Notes (Signed)
Arrived to find the purple port sluggish to flush with no blood return and white port is completely occluded.  I instilled cathflow into the purple port to dwell for 2 hours. I attempted to instill cathflow into the white port however, I was unsuccessful with instillation.  A dead end cap was placed on both ports.  I will suggest that this patient receive a PICC exchange as result of the complete occlusion and the increase risk for developing a CLABSI. Consuello Masseimmons, Faith Branan M

## 2016-03-02 NOTE — Progress Notes (Signed)
RN called RT to patient room due to patient having a decrease in oxygen saturation and increased respiratory rate.  Patient was placed back on ventilator, on full support, and is tolerating well.  Will continue to monitor.

## 2016-03-03 ENCOUNTER — Inpatient Hospital Stay (HOSPITAL_COMMUNITY): Payer: Medicaid Other

## 2016-03-03 DIAGNOSIS — D62 Acute posthemorrhagic anemia: Secondary | ICD-10-CM | POA: Diagnosis not present

## 2016-03-03 DIAGNOSIS — S36113A Laceration of liver, unspecified degree, initial encounter: Secondary | ICD-10-CM | POA: Diagnosis present

## 2016-03-03 DIAGNOSIS — K7290 Hepatic failure, unspecified without coma: Secondary | ICD-10-CM | POA: Diagnosis not present

## 2016-03-03 DIAGNOSIS — S62102A Fracture of unspecified carpal bone, left wrist, initial encounter for closed fracture: Secondary | ICD-10-CM | POA: Diagnosis present

## 2016-03-03 DIAGNOSIS — S36409A Unspecified injury of unspecified part of small intestine, initial encounter: Secondary | ICD-10-CM | POA: Diagnosis present

## 2016-03-03 DIAGNOSIS — T5491XA Toxic effect of unspecified corrosive substance, accidental (unintentional), initial encounter: Secondary | ICD-10-CM | POA: Diagnosis present

## 2016-03-03 DIAGNOSIS — S2241XA Multiple fractures of ribs, right side, initial encounter for closed fracture: Secondary | ICD-10-CM | POA: Diagnosis present

## 2016-03-03 DIAGNOSIS — S2242XA Multiple fractures of ribs, left side, initial encounter for closed fracture: Secondary | ICD-10-CM | POA: Diagnosis present

## 2016-03-03 DIAGNOSIS — J9600 Acute respiratory failure, unspecified whether with hypoxia or hypercapnia: Secondary | ICD-10-CM | POA: Diagnosis present

## 2016-03-03 DIAGNOSIS — N179 Acute kidney failure, unspecified: Secondary | ICD-10-CM | POA: Diagnosis not present

## 2016-03-03 DIAGNOSIS — R739 Hyperglycemia, unspecified: Secondary | ICD-10-CM | POA: Diagnosis present

## 2016-03-03 DIAGNOSIS — S27322A Contusion of lung, bilateral, initial encounter: Secondary | ICD-10-CM | POA: Diagnosis present

## 2016-03-03 DIAGNOSIS — S3282XS Multiple fractures of pelvis without disruption of pelvic ring, sequela: Secondary | ICD-10-CM | POA: Diagnosis present

## 2016-03-03 DIAGNOSIS — T1491XA Suicide attempt, initial encounter: Secondary | ICD-10-CM | POA: Diagnosis present

## 2016-03-03 DIAGNOSIS — T391X1A Poisoning by 4-Aminophenol derivatives, accidental (unintentional), initial encounter: Secondary | ICD-10-CM | POA: Diagnosis present

## 2016-03-03 DIAGNOSIS — E876 Hypokalemia: Secondary | ICD-10-CM | POA: Diagnosis not present

## 2016-03-03 DIAGNOSIS — E870 Hyperosmolality and hypernatremia: Secondary | ICD-10-CM | POA: Diagnosis not present

## 2016-03-03 LAB — CBC
HCT: 25.8 % — ABNORMAL LOW (ref 36.0–46.0)
Hemoglobin: 8.1 g/dL — ABNORMAL LOW (ref 12.0–15.0)
MCH: 29.9 pg (ref 26.0–34.0)
MCHC: 31.4 g/dL (ref 30.0–36.0)
MCV: 95.2 fL (ref 78.0–100.0)
PLATELETS: 294 10*3/uL (ref 150–400)
RBC: 2.71 MIL/uL — ABNORMAL LOW (ref 3.87–5.11)
RDW: 25.8 % — AB (ref 11.5–15.5)
WBC: 13.9 10*3/uL — ABNORMAL HIGH (ref 4.0–10.5)

## 2016-03-03 LAB — GLUCOSE, CAPILLARY
GLUCOSE-CAPILLARY: 133 mg/dL — AB (ref 65–99)
GLUCOSE-CAPILLARY: 155 mg/dL — AB (ref 65–99)
Glucose-Capillary: 173 mg/dL — ABNORMAL HIGH (ref 65–99)
Glucose-Capillary: 154 mg/dL — ABNORMAL HIGH (ref 65–99)
Glucose-Capillary: 125 mg/dL — ABNORMAL HIGH (ref 65–99)
Glucose-Capillary: 159 mg/dL — ABNORMAL HIGH (ref 65–99)

## 2016-03-03 LAB — BASIC METABOLIC PANEL
ANION GAP: 13 (ref 5–15)
BUN: 184 mg/dL — AB (ref 6–20)
CHLORIDE: 115 mmol/L — AB (ref 101–111)
CO2: 28 mmol/L (ref 22–32)
Calcium: 8.3 mg/dL — ABNORMAL LOW (ref 8.9–10.3)
Creatinine, Ser: 1.97 mg/dL — ABNORMAL HIGH (ref 0.44–1.00)
GFR calc Af Amer: 38 mL/min — ABNORMAL LOW (ref >60)
GFR calc non Af Amer: 32 mL/min — ABNORMAL LOW (ref >60)
GLUCOSE: 162 mg/dL — AB (ref 65–99)
POTASSIUM: 2.6 mmol/L — AB (ref 3.5–5.1)
Sodium: 156 mmol/L — ABNORMAL HIGH (ref 135–145)

## 2016-03-03 MED ORDER — ENOXAPARIN SODIUM 30 MG/0.3ML ~~LOC~~ SOLN
30.0000 mg | Freq: Two times a day (BID) | SUBCUTANEOUS | Status: DC
  Administered 2016-03-03 – 2016-03-13 (×21): 30 mg via SUBCUTANEOUS
  Filled 2016-03-03 (×21): qty 0.3

## 2016-03-03 MED ORDER — DIAZEPAM 1 MG/ML PO SOLN
5.0000 mg | Freq: Two times a day (BID) | ORAL | Status: DC
  Administered 2016-03-03 – 2016-03-05 (×3): 5 mg via ORAL
  Filled 2016-03-03 (×3): qty 5

## 2016-03-03 MED ORDER — POTASSIUM CHLORIDE 10 MEQ/50ML IV SOLN
10.0000 meq | INTRAVENOUS | Status: AC
  Administered 2016-03-03 (×5): 10 meq via INTRAVENOUS
  Filled 2016-03-03 (×5): qty 50

## 2016-03-03 NOTE — Clinical Social Work Note (Signed)
Clinical Social Worker continuing to follow patient and family for support.  Patient now has a trach and has intermittently weaned for short periods of time to trach collar.  Patient sister not at bedside at this time.  Patient not following commands and having trouble weaning today.  CSW remains available for support and will update psych CSW once patient has progressed and can participate.  Macario GoldsJesse Cynethia Schindler, KentuckyLCSW [phone]

## 2016-03-03 NOTE — Progress Notes (Signed)
Patient ID: [name] Bell, female   DOB: Jul 16, 1981, 35 y.o.   MRN: [id number]   LOS: 23 days   Subjective: On vent.   Objective: Vital signs in last 24 hours: Temp:  [98.8 F (37.1 C)-99.5 F (37.5 C)] 99 F (37.2 C) (04/26 0800) Pulse Rate:  [80-99] 80 (04/26 0700) Resp:  [0-30] 25 (04/26 0700) BP: (80-122)/(49-76) 96/55 mmHg (04/26 0700) SpO2:  [90 %-100 %] 97 % (04/26 0700) FiO2 (%):  [30 %-40 %] 30 % (04/26 0317) Weight:  [124.2 kg (273 lb 13 oz)] 124.2 kg (273 lb 13 oz) (04/26 0140) Last BM Date: 03/03/16   VENT: PRVC   UOP: 1750ml/h NET: -147240ml/24h TOTAL: +2776232ml/admission   Laboratory CBC  Recent Labs  03/02/16 1011 03/03/16 0650  WBC 12.4* 13.9*  HGB 8.5* 8.1*  HCT 27.2* 25.8*  PLT 304 294   BMET  Recent Labs  03/02/16 1011 03/03/16 0650  NA 155* 156*  K 3.1* 2.6*  CL 115* 115*  CO2 27 28  GLUCOSE 163* 162*  BUN 181* 184*  CREATININE 2.10* 1.97*  CALCIUM 8.7* 8.3*   CBG (last 3)   Recent Labs  03/02/16 2317 03/03/16 0314 03/03/16 0748  GLUCAP 187* 173* 133*    Radiology PORTABLE CHEST 1 VIEW  COMPARISON: 03/02/2016  FINDINGS: Cardiac shadow is mildly enlarged but stable. A right-sided PICC line is seen at the cavoatrial junction. Tracheostomy tube is noted and stable. Right-sided thoracostomy catheter is noted with the proximal side port extrinsic to the rib cage but stable in appearance. Persistent capping pleural effusion is noted on the right. No true pneumothorax is seen. Multiple right-sided rib fractures are again noted.  IMPRESSION: Persistent right-sided pleural effusion. No definitive pneumothorax is seen.  Stable right thoracostomy catheter.  PICC line now in satisfactory position.   Electronically Signed  By: Alcide CleverMark Lukens M.D.  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 Continue CT per [facility] Multiple toxic ingestions Mult L wrist FXs - splint for now Pelvic FX - S/P ex fix and angioembolization, S/P sacral screw by Dr. Carola FrostHandy. Plan ex fix as definitive treatment and Dr. Doren CustardWatch sites for pressure ulcers  Hepatic failure - Stable AKI - Improving ABL anemia - stable Hyperglycemia - SSI ID - Watch rising WBC FEN - Replacing K+, increase D5 for hypernatremia, will stop Lasix with hypokalemia but may need to restart if she won't autodiurese. Decrease valium. VTE - SCD's, Lovenox (increase for weight with improved Cr) Dispo - ICU, updated sister  Critical care time: 0905 -- 0935    Freeman CaldronMichael J. Denard Tuminello, PA-C Pager: [phone] General Trauma PA Pager: [phone]  03/03/2016

## 2016-03-03 NOTE — Progress Notes (Signed)
Pt palced on ATC broefly. PT had increased WOB, shallow breaths, desaturation, and crying with grimacing. RT placed back to vent and full support. RN and MD aware.

## 2016-03-03 NOTE — Progress Notes (Addendum)
CRITICAL VALUE ALERT  Critical value received:  K  2.6  Date of notification:  03/03/16  Time of notification: 0720  Critical value read back: yes  Nurse who received alert:  Londell MohKaty Khaniya Tenaglia, RN  MD notified (1st page):  Dr. Lindie SpruceWyatt  Time of first page:  0720  MD notified (2nd page):  Time of second page:  Responding MD:  Dr. Lindie SpruceWyatt  Time MD responded:  [phone]   **Order to give 5 runs of K

## 2016-03-03 NOTE — Progress Notes (Signed)
While assessing trach, RT noticed breakdown at trach site. RN looked at site and spoke with MD and also paged PA to come assess. Left open to air at this time until further notice from MD.

## 2016-03-03 NOTE — Progress Notes (Signed)
[facility] DAILY ICU PROGRESS NOTE                   [address]            [place] [address]          [phone]   2 Days Post-Op Procedure(s) (LRB): ABDOMINAL WOUND CLOSURE (N/A) PERCUTANEOUS TRACHEOSTOMY (N/A)  Total Length of Stay:  LOS: 23 days   Subjective: No response to verbal stimuli  Objective: Vital signs in last 24 hours: Temp:  [97.3 F (36.3 C)-99.5 F (37.5 C)] 99.1 F (37.3 C) (04/26 0400) Pulse Rate:  [80-99] 80 (04/26 0700) Cardiac Rhythm:  [-] Normal sinus rhythm (04/25 2000) Resp:  [0-30] 25 (04/26 0700) BP: (80-122)/(49-76) 96/55 mmHg (04/26 0700) SpO2:  [90 %-100 %] 97 % (04/26 0700) FiO2 (%):  [28 %-40 %] 30 % (04/26 0317) Weight:  [273 lb 13 oz (124.2 kg)] 273 lb 13 oz (124.2 kg) (04/26 0140)  Filed Weights   02/29/16 0500 03/01/16 0600 03/03/16 0140  Weight: 281 lb 1.4 oz (127.5 kg) 278 lb (126.1 kg) 273 lb 13 oz (124.2 kg)    Weight change:    Hemodynamic parameters for last 24 hours:    Intake/Output from previous day: 04/25 0701 - 04/26 0700 In: 2880 [I.V.:2160; NG/GT:720] Out: 4320 [Urine:4185; Drains:90; Chest Tube:45]  Intake/Output this shift:    Current Meds: Scheduled Meds: . alteplase  2 mg Intracatheter Once  . antiseptic oral rinse  7 mL Mouth Rinse 10 times per day  . bisacodyl  10 mg Rectal Daily  . chlorhexidine gluconate (SAGE KIT)  15 mL Mouth Rinse BID  . diazepam  5 mg Oral 3 times per day  . enoxaparin (LOVENOX) injection  40 mg Subcutaneous Q24H  . escitalopram  10 mg Per Tube Daily  . feeding supplement (PIVOT 1.5 CAL)  1,000 mL Per Tube Q24H  . feeding supplement (PRO-STAT SUGAR FREE 64)  60 mL Per Tube TID  . free water  400 mL Per Tube Q4H  . furosemide  80 mg Intravenous Q12H  . insulin aspart  0-20 Units Subcutaneous 6 times per day  . lidocaine (PF)  30 mL Intradermal Once  . pantoprazole  40 mg Oral Daily   Or  . pantoprazole (PROTONIX) IV  40 mg Intravenous Daily  . potassium  chloride  10 mEq Intravenous Q1 Hr x 5  . sodium chloride flush  10-40 mL Intracatheter Q12H   Continuous Infusions: . dextrose 75 mL/hr at 03/03/16 0152  . fentaNYL infusion INTRAVENOUS 150 mcg/hr (03/02/16 1900)   PRN Meds:.fentaNYL, ipratropium-albuterol, LORazepam, ondansetron **OR** ondansetron (ZOFRAN) IV, sodium chloride flush  General appearance: no distress Heart: regular rate and rhythm Lungs: coarse BS  Lab Results: CBC: Recent Labs  03/02/16 1011 03/03/16 0650  WBC 12.4* 13.9*  HGB 8.5* 8.1*  HCT 27.2* 25.8*  PLT 304 294   BMET:  Recent Labs  03/02/16 1011 03/03/16 0650  NA 155* 156*  K 3.1* 2.6*  CL 115* 115*  CO2 27 28  GLUCOSE 163* 162*  BUN 181* 184*  CREATININE 2.10* 1.97*  CALCIUM 8.7* 8.3*    PT/INR: No results for input(s): LABPROT, INR in the last 72 hours. Radiology: Dg Pelvis Comp Min 3v  03/02/2016  CLINICAL DATA:  Follow up fixation of multiple pelvic fractures EXAM: JUDET PELVIS - 3+ VIEW COMPARISON:  02/16/16 FINDINGS: Screws through the right SI joint are stable, the more superior of the two  also crossing the left SI joint. Fractures of the bilateral pubic bones again identified with stable appearance and stable widening of the symphysis. IMPRESSION: Stable ORIF pelvic fractures Electronically Signed   By: Skipper Cliche M.D.   On: 03/02/2016 11:30     Assessment/Plan: S/P Procedure(s) (LRB): ABDOMINAL WOUND CLOSURE (N/A) PERCUTANEOUS TRACHEOSTOMY (N/A)  1 minimal drainage, dark blood, CXR is stable in appearance- cont tube for now     Joniel Graumann E 03/03/2016 7:54 AM

## 2016-03-04 LAB — BASIC METABOLIC PANEL
ANION GAP: 11 (ref 5–15)
BUN: 164 mg/dL — AB (ref 6–20)
CALCIUM: 8.3 mg/dL — AB (ref 8.9–10.3)
CO2: 29 mmol/L (ref 22–32)
Chloride: 117 mmol/L — ABNORMAL HIGH (ref 101–111)
Creatinine, Ser: 1.84 mg/dL — ABNORMAL HIGH (ref 0.44–1.00)
GFR calc Af Amer: 41 mL/min — ABNORMAL LOW (ref >60)
GFR, EST NON AFRICAN AMERICAN: 35 mL/min — AB (ref >60)
GLUCOSE: 187 mg/dL — AB (ref 65–99)
Potassium: 2.9 mmol/L — ABNORMAL LOW (ref 3.5–5.1)
Sodium: 157 mmol/L — ABNORMAL HIGH (ref 135–145)

## 2016-03-04 LAB — GLUCOSE, CAPILLARY
GLUCOSE-CAPILLARY: 175 mg/dL — AB (ref 65–99)
GLUCOSE-CAPILLARY: 162 mg/dL — AB (ref 65–99)
GLUCOSE-CAPILLARY: 163 mg/dL — AB (ref 65–99)
GLUCOSE-CAPILLARY: 145 mg/dL — AB (ref 65–99)
Glucose-Capillary: 155 mg/dL — ABNORMAL HIGH (ref 65–99)

## 2016-03-04 LAB — CBC
HCT: 24.5 % — ABNORMAL LOW (ref 36.0–46.0)
HEMOGLOBIN: 7.5 g/dL — AB (ref 12.0–15.0)
MCH: 29.5 pg (ref 26.0–34.0)
MCHC: 30.6 g/dL (ref 30.0–36.0)
MCV: 96.5 fL (ref 78.0–100.0)
Platelets: 309 10*3/uL (ref 150–400)
RBC: 2.54 MIL/uL — ABNORMAL LOW (ref 3.87–5.11)
RDW: 25.9 % — AB (ref 11.5–15.5)
WBC: 16.6 10*3/uL — ABNORMAL HIGH (ref 4.0–10.5)

## 2016-03-04 MED ORDER — POTASSIUM CHLORIDE 20 MEQ/15ML (10%) PO SOLN
40.0000 meq | Freq: Two times a day (BID) | ORAL | Status: AC
  Administered 2016-03-04 (×2): 40 meq
  Filled 2016-03-04 (×2): qty 30

## 2016-03-04 MED ORDER — POTASSIUM CHLORIDE 10 MEQ/50ML IV SOLN
10.0000 meq | INTRAVENOUS | Status: AC
  Administered 2016-03-04 (×2): 10 meq via INTRAVENOUS
  Filled 2016-03-04: qty 50

## 2016-03-04 NOTE — Progress Notes (Addendum)
      [address]       [place] [id number]             [phone]      3 Days Post-Op Procedure(s) (LRB): ABDOMINAL WOUND CLOSURE (N/A) PERCUTANEOUS TRACHEOSTOMY (N/A)   Subjective:  Difficulty tolerating trach trials, trach site is breaking down  Objective: Vital signs in last 24 hours: Temp:  [98.6 F (37 C)-98.9 F (37.2 C)] 98.8 F (37.1 C) (04/27 0400) Pulse Rate:  [80-93] 92 (04/27 0820) Cardiac Rhythm:  [-] Normal sinus rhythm (04/26 2000) Resp:  [9-32] 32 (04/27 0820) BP: (91-115)/(50-72) 101/53 mmHg (04/27 0820) SpO2:  [91 %-100 %] 96 % (04/27 0820) FiO2 (%):  [28 %-30 %] 30 % (04/27 0820) Weight:  [275 lb 2.2 oz (124.8 kg)] 275 lb 2.2 oz (124.8 kg) (04/27 0420)  Intake/Output from previous day: 04/26 0701 - 04/27 0700 In: 4665.8 [I.V.:2695.8; NG/GT:1920; IV Piggyback:50] Out: 3925 [Urine:3830; Drains:60; Chest Tube:35]  General appearance: sedated on trach Heart: regular rate and rhythm Lungs: clear to auscultation bilaterally Wound: Chest tube site clean and ry  Lab Results:  Recent Labs  03/03/16 0650 03/04/16 0654  WBC 13.9* 16.6*  HGB 8.1* 7.5*  HCT 25.8* 24.5*  PLT 294 309   BMET:  Recent Labs  03/03/16 0650 03/04/16 0654  NA 156* 157*  K 2.6* 2.9*  CL 115* 117*  CO2 28 29  GLUCOSE 162* 187*  BUN 184* 164*  CREATININE 1.97* 1.84*  CALCIUM 8.3* 8.3*    PT/INR: No results for input(s): LABPROT, INR in the last 72 hours. ABG    Component Value Date/Time   PHART 7.508* 02/28/2016 0642   HCO3 25.5* 02/28/2016 0642   TCO2 26.5 02/28/2016 0642   ACIDBASEDEF 2.0 02/13/2016 0411   O2SAT 99.2 02/28/2016 0642   CBG (last 3)   Recent Labs  03/03/16 2329 03/04/16 0330 03/04/16 0737  GLUCAP 155* 175* 162*    Assessment/Plan: S/P Procedure(s) (LRB): ABDOMINAL WOUND CLOSURE (N/A) PERCUTANEOUS TRACHEOSTOMY (N/A)  1. Chest tube- output remains low, 35 cc yesterday- leave in place, no CXR done today 2. Care per  trauma    LOS: 24 days    BARRETT, ERIN 03/04/2016  Chest x-ray in a.m.  patient with chest tube in place until off ventilator patient examined and medical record reviewed,agree with above note. Kathlee Nationseter Van Trigt III 03/04/2016

## 2016-03-04 NOTE — Progress Notes (Signed)
SLP Cancellation Note  Patient Details Name: Phylliss BobKatia D Minar MRN: [id number] DOB: 01-Aug-1981   Cancelled treatment:       Reason Eval/Treat Not Completed: Patient not medically ready  Maxcine HamLaura Paiewonsky, M.A. CCC-SLP [phone]  [name], Giordano Getman 03/04/2016, 11:10 AM
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
## 2016-03-05 ENCOUNTER — Inpatient Hospital Stay (HOSPITAL_COMMUNITY): Payer: Medicaid Other

## 2016-03-05 DIAGNOSIS — J90 Pleural effusion, not elsewhere classified: Secondary | ICD-10-CM

## 2016-03-05 DIAGNOSIS — J9601 Acute respiratory failure with hypoxia: Secondary | ICD-10-CM

## 2016-03-05 LAB — GLUCOSE, CAPILLARY
GLUCOSE-CAPILLARY: 142 mg/dL — AB (ref 65–99)
GLUCOSE-CAPILLARY: 166 mg/dL — AB (ref 65–99)
GLUCOSE-CAPILLARY: 166 mg/dL — AB (ref 65–99)
Glucose-Capillary: 128 mg/dL — ABNORMAL HIGH (ref 65–99)
Glucose-Capillary: 128 mg/dL — ABNORMAL HIGH (ref 65–99)
Glucose-Capillary: 114 mg/dL — ABNORMAL HIGH (ref 65–99)

## 2016-03-05 LAB — CBC
HCT: 23.9 % — ABNORMAL LOW (ref 36.0–46.0)
Hemoglobin: 7.3 g/dL — ABNORMAL LOW (ref 12.0–15.0)
MCH: 30.4 pg (ref 26.0–34.0)
MCHC: 30.5 g/dL (ref 30.0–36.0)
MCV: 99.6 fL (ref 78.0–100.0)
PLATELETS: 297 10*3/uL (ref 150–400)
RBC: 2.4 MIL/uL — AB (ref 3.87–5.11)
RDW: 25.7 % — AB (ref 11.5–15.5)
WBC: 17.9 10*3/uL — AB (ref 4.0–10.5)

## 2016-03-05 LAB — BASIC METABOLIC PANEL
Anion gap: 12 (ref 5–15)
BUN: 156 mg/dL — AB (ref 6–20)
CALCIUM: 8.4 mg/dL — AB (ref 8.9–10.3)
CO2: 27 mmol/L (ref 22–32)
Chloride: 112 mmol/L — ABNORMAL HIGH (ref 101–111)
Creatinine, Ser: 1.68 mg/dL — ABNORMAL HIGH (ref 0.44–1.00)
GFR calc Af Amer: 46 mL/min — ABNORMAL LOW (ref >60)
GFR, EST NON AFRICAN AMERICAN: 39 mL/min — AB (ref >60)
Glucose, Bld: 179 mg/dL — ABNORMAL HIGH (ref 65–99)
POTASSIUM: 3.4 mmol/L — AB (ref 3.5–5.1)
SODIUM: 151 mmol/L — AB (ref 135–145)

## 2016-03-05 LAB — HEPATIC FUNCTION PANEL
ALT: 43 U/L (ref 14–54)
AST: 97 U/L — ABNORMAL HIGH (ref 15–41)
Albumin: 1.4 g/dL — ABNORMAL LOW (ref 3.5–5.0)
Alkaline Phosphatase: 99 U/L (ref 38–126)
BILIRUBIN DIRECT: 4 mg/dL — AB (ref 0.1–0.5)
BILIRUBIN INDIRECT: 3.1 mg/dL — AB (ref 0.3–0.9)
TOTAL PROTEIN: 6 g/dL — AB (ref 6.5–8.1)
Total Bilirubin: 7.1 mg/dL — ABNORMAL HIGH (ref 0.3–1.2)

## 2016-03-05 MED ORDER — DIAZEPAM 1 MG/ML PO SOLN
5.0000 mg | Freq: Four times a day (QID) | ORAL | Status: DC
  Administered 2016-03-05 – 2016-03-08 (×11): 5 mg via ORAL
  Filled 2016-03-05 (×11): qty 5

## 2016-03-05 MED ORDER — FENTANYL 50 MCG/HR TD PT72
100.0000 ug | MEDICATED_PATCH | TRANSDERMAL | Status: DC
  Administered 2016-03-05 – 2016-03-17 (×5): 100 ug via TRANSDERMAL
  Filled 2016-03-05 (×5): qty 2

## 2016-03-05 MED ORDER — POTASSIUM CHLORIDE 20 MEQ/15ML (10%) PO SOLN
40.0000 meq | Freq: Two times a day (BID) | ORAL | Status: AC
  Administered 2016-03-05 (×2): 40 meq
  Filled 2016-03-05 (×2): qty 30
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
## 2016-03-05 NOTE — Progress Notes (Signed)
SLP Cancellation Note  Patient Details Name: Phylliss BobKatia D Prindiville MRN: [id number] DOB: [birth year]   Cancelled treatment:        Pt unable to remain off PRVC. Will follow   Royce MacadamiaLitaker, Shafter Jupin Willis 03/05/2016, 4:11 PM

## 2016-03-05 NOTE — Clinical Social Work Note (Signed)
Clinical Social Worker continuing to follow patient and family for support. Patient now has a trach and has intermittently weaned for short periods of time. Patient sister not at bedside at this time. Patient not following commands and having trouble weaning today due to getting very anxious. CSW remains available for support and will update psych CSW once patient is able to participate.  Macario GoldsJesse Eulises Kijowski, [place] [phone]

## 2016-03-05 NOTE — Progress Notes (Signed)
Rt called to patients room for desats. Pt bag/lavaged and suctioned. Placed back on vent with SATS 99%. Trach care done and inner cannula changed. Trach site packed with sterile saline dressing.

## 2016-03-06 DIAGNOSIS — J189 Pneumonia, unspecified organism: Secondary | ICD-10-CM

## 2016-03-06 DIAGNOSIS — J90 Pleural effusion, not elsewhere classified: Secondary | ICD-10-CM

## 2016-03-06 LAB — CULTURE, RESPIRATORY: SPECIAL REQUESTS: NORMAL

## 2016-03-06 LAB — CBC
HCT: 24.2 % — ABNORMAL LOW (ref 36.0–46.0)
Hemoglobin: 7.2 g/dL — ABNORMAL LOW (ref 12.0–15.0)
MCH: 30 pg (ref 26.0–34.0)
MCHC: 29.8 g/dL — ABNORMAL LOW (ref 30.0–36.0)
MCV: 100.8 fL — AB (ref 78.0–100.0)
Platelets: 298 10*3/uL (ref 150–400)
RBC: 2.4 MIL/uL — ABNORMAL LOW (ref 3.87–5.11)
RDW: 25.2 % — ABNORMAL HIGH (ref 11.5–15.5)
WBC: 18.6 10*3/uL — ABNORMAL HIGH (ref 4.0–10.5)

## 2016-03-06 LAB — GLUCOSE, CAPILLARY
GLUCOSE-CAPILLARY: 134 mg/dL — AB (ref 65–99)
GLUCOSE-CAPILLARY: 171 mg/dL — AB (ref 65–99)
GLUCOSE-CAPILLARY: 141 mg/dL — AB (ref 65–99)
GLUCOSE-CAPILLARY: 203 mg/dL — AB (ref 65–99)
Glucose-Capillary: 143 mg/dL — ABNORMAL HIGH (ref 65–99)
Glucose-Capillary: 143 mg/dL — ABNORMAL HIGH (ref 65–99)

## 2016-03-06 LAB — BASIC METABOLIC PANEL
ANION GAP: 9 (ref 5–15)
BUN: 129 mg/dL — AB (ref 6–20)
CHLORIDE: 115 mmol/L — AB (ref 101–111)
CO2: 28 mmol/L (ref 22–32)
Calcium: 8.2 mg/dL — ABNORMAL LOW (ref 8.9–10.3)
Creatinine, Ser: 1.39 mg/dL — ABNORMAL HIGH (ref 0.44–1.00)
GFR calc Af Amer: 57 mL/min — ABNORMAL LOW (ref >60)
GFR, EST NON AFRICAN AMERICAN: 49 mL/min — AB (ref >60)
Glucose, Bld: 155 mg/dL — ABNORMAL HIGH (ref 65–99)
POTASSIUM: 3.8 mmol/L (ref 3.5–5.1)
Sodium: 152 mmol/L — ABNORMAL HIGH (ref 135–145)

## 2016-03-06 MED ORDER — DEXTROSE 5 % IV SOLN
2.0000 g | Freq: Two times a day (BID) | INTRAVENOUS | Status: DC
  Administered 2016-03-06 – 2016-03-08 (×4): 2 g via INTRAVENOUS
  Filled 2016-03-06 (×5): qty 2

## 2016-03-06 NOTE — Anesthesia Postprocedure Evaluation (Signed)
Anesthesia Post Note  Patient: [name] Bell  Procedure(s) Performed: Procedure(s) (LRB): ABDOMINAL WOUND CLOSURE (N/A) PERCUTANEOUS TRACHEOSTOMY (N/A)  Patient location during evaluation: PACU Anesthesia Type: General Level of consciousness: awake and alert Pain management: pain level controlled Vital Signs Assessment: post-procedure vital signs reviewed and stable Respiratory status: spontaneous breathing, nonlabored ventilation, respiratory function stable and patient connected to nasal cannula oxygen Cardiovascular status: blood pressure returned to baseline and stable Postop Assessment: no signs of nausea or vomiting Anesthetic complications: no    Last Vitals:  Filed Vitals:   03/06/16 1300 03/06/16 1400  BP: 105/64 112/69  Pulse: 81 89  Temp:    Resp: 13 27    Last Pain:  Filed Vitals:   03/06/16 1413  PainSc: Asleep                 Kennieth RadFitzgerald, Minna Dumire E

## 2016-03-06 NOTE — Progress Notes (Signed)
PULMONARY / CRITICAL CARE MEDICINE   Name: [name] Bell MRN: [id number] DOB: Oct 30, 1981    ADMISSION DATE:  02/09/2016 CONSULTATION DATE:  03/05/2016  REFERRING MD :  Trauma  CHIEF COMPLAINT:  Ventilator managment  INITIAL PRESENTATION:  35 yo female admitted on 4/3 after she jumped from her 5th floor hotel room. She was brought to the ED as a level 1 trauma secondary to hypotension. She c/o abd pain. She was conscious, denied medical history, admitted to drinking a liquid drain remover prior to jumping. She was intubated in the ED.  She suffered : Multiple right rib fxs w/HPTX s/p CT Hemoperitoneum -- to OR for ex lap Multiple pelvic fxs Hypovolemic shock Left forearm lac Right ankle abrasions/swelling Multiple contusions ARF  SIGNIFICANT EVENTS: Vented overnight Failing trach collar trials   SUBJECTIVE:   VITAL SIGNS: Temp:  [97.8 F (36.6 C)-98.7 F (37.1 C)] 97.8 F (36.6 C) (04/29 1143) Pulse Rate:  [78-96] 81 (04/29 1201) Resp:  [5-32] 27 (04/29 1201) BP: (91-119)/(50-75) 100/57 mmHg (04/29 1201) SpO2:  [95 %-100 %] 100 % (04/29 1201) FiO2 (%):  [30 %] 30 % (04/29 1201) Weight:  [127.7 kg (281 lb 8.4 oz)] 127.7 kg (281 lb 8.4 oz) (04/29 0500) HEMODYNAMICS:   VENTILATOR SETTINGS: Vent Mode:  [-] PRVC FiO2 (%):  [30 %] 30 % Set Rate:  [20 bmp] 20 bmp Vt Set:  [490 mL] 490 mL PEEP:  [5 cmH20] 5 cmH20 Plateau Pressure:  [23 cmH20-28 cmH20] 24 cmH20 INTAKE / OUTPUT:  Intake/Output Summary (Last 24 hours) at 03/06/16 1257 Last data filed at 03/06/16 1200  Gross per 24 hour  Intake   6345 ml  Output   3925 ml  Net   2420 ml    PHYSICAL EXAMINATION: General:  On vent HEENT: Trach in place PULM: diminished on R, rhonchi bilaterally, vent supported breaths CV: RRR, no mgr GI: BS+, soft, nontender MSK: normal bulk and tone Neuro: sonorous, stirs to touch  LABS:  CBC  Recent Labs Lab 03/04/16 0654 03/05/16 0631 03/06/16 0550  WBC 16.6* 17.9*  18.6*  HGB 7.5* 7.3* 7.2*  HCT 24.5* 23.9* 24.2*  PLT 309 297 298   Coag's No results for input(s): APTT, INR in the last 168 hours. BMET  Recent Labs Lab 03/04/16 0654 03/05/16 0631 03/06/16 0550  NA 157* 151* 152*  K 2.9* 3.4* 3.8  CL 117* 112* 115*  CO2 BUN 164* 156* 129*  CREATININE 1.84* 1.68* 1.39*  GLUCOSE 187* 179* 155*   Electrolytes  Recent Labs Lab 03/04/16 0654 03/05/16 0631 03/06/16 0550  CALCIUM 8.3* 8.4* 8.2*   Sepsis Markers No results for input(s): LATICACIDVEN, PROCALCITON, O2SATVEN in the last 168 hours. ABG No results for input(s): PHART, PCO2ART, PO2ART in the last 168 hours. Liver Enzymes  Recent Labs Lab 03/02/16 1011 03/05/16 0631  AST 216* 97*  ALT 100* 43  ALKPHOS 89 99  BILITOT 10.3* 7.1*  ALBUMIN 1.5* 1.4*   Cardiac Enzymes No results for input(s): TROPONINI, PROBNP in the last 168 hours. Glucose  Recent Labs Lab 03/05/16 1654 03/05/16 2002 03/06/16 0008 03/06/16 0324 03/06/16 0732 03/06/16 1141  GLUCAP 128* 114* 143* 143* 134* 171*    Imaging No results found.   ASSESSMENT / PLAN: 35 yo AAF admitted as Level 1 trauma after suicide attempt drinking drano and jumping from 5th story of hotel suffered abd/pelvic fracture, rib fracture, now with trach and peg with trach breakdown.  PULMONARY A: Chronic  respiratory failure/vent dependence  Tracheostomy Trach breakdown R Rib fractures Large R pleural effusion HCAP?  P:   See ID Continue full vent support (failed pressure support 18/5 for me) VAP bundle Chest tube per TCTS   INFECTIOUS A:   Enterobacter respiratory secretions> worrisome for HCAP given thick secretions, failure to wean, rising WBC  P:   Start cefepime, plan 5 days  My cc time 31 minutes  Updated sister bedside  Heber CarolinaBrent Alvaro Aungst, MD [facility] Pager: [phone] Cell: [phone] After 3pm or if no response, call [phone]   03/06/2016, 12:57 PM

## 2016-03-06 NOTE — Progress Notes (Signed)
Orthopaedic Trauma Service (OTS)   Subjective: Patient is trached and responsive.    Objective: Temp:  [97.9 F (36.6 C)-98.7 F (37.1 C)] 97.9 F (36.6 C) (04/29 0800) Pulse Rate:  [71-96] 79 (04/29 1100) Resp:  [5-32] 27 (04/29 1100) BP: (91-120)/(50-75) 100/57 mmHg (04/29 1100) SpO2:  [95 %-100 %] 100 % (04/29 1100) FiO2 (%):  [30 %] 30 % (04/29 0733) Weight:  [281 lb 8.4 oz (127.7 kg)] 281 lb 8.4 oz (127.7 kg) (04/29 0500) Physical Exam Left wrist splint adjusted Pin sites with fresh wrap dressing and appear in good condition Skin protected close to fixator  Assessment/Plan: 5 Days Post-Op Procedure(s) (LRB): ABDOMINAL WOUND CLOSURE (N/A) PERCUTANEOUS TRACHEOSTOMY (N/A) 1. Plan to leave fixator for another 2 wks plus more if tolerated  2. May mobilize to bed to chair anytime 3. Continue soap and water as needed to remove crust and daily Kerlix or similar gauze wrap; excellent care by nursing 4. Xrays in 10-14 days  Myrene GalasMichael Jizelle Conkey, MD [facility] [phone] (p)

## 2016-03-06 NOTE — Progress Notes (Signed)
      [address]       [place] [id number]             [phone]      Remains on vent Trach in place No chest xray today 150 ml out form CT yesterday Will recheck CXR tomorrow  Viviann SpareSteven C. Dorris FetchHendrickson, MD [facility] [phone]

## 2016-03-06 NOTE — Evaluation (Signed)
SLP Cancellation Note  Patient Details Name: Phylliss BobKatia D Banko MRN: [id number] DOB: [birth year]   Cancelled treatment:       Reason Eval/Treat Not Completed: Medical issues which prohibited therapy  Pt on ventilator   Mills KollerKimball, Joanna Borawski Ann Zayvion Stailey, MS [facility] SLP [phone]

## 2016-03-06 NOTE — Progress Notes (Signed)
Fentanyl gtt bag and tubing changed.  200cc Fentanyl waste in sink.  Witnessed by Arelia LongestJ. McDaniel, RN.

## 2016-03-06 NOTE — Progress Notes (Signed)
Pharmacy Antibiotic Note  [name] Bell is a 35 y.o. female admitted on 02/09/2016 with pneumonia.  Pharmacy has been consulted for Cefepime dosing.  Plan: Cefepime 2g IV q12h for 5d Monitor culture data, renal function and clinical course  Height: 5\' 11"  (180.3 cm) Weight: 281 lb 8.4 oz (127.7 kg) IBW/kg (Calculated) : 70.8  Temp (24hrs), Avg:98.2 F (36.8 C), Min:97.8 F (36.6 C), Max:98.7 F (37.1 C)   Recent Labs Lab 03/02/16 1011 03/03/16 0650 03/04/16 0654 03/05/16 0631 03/06/16 0550  WBC 12.4* 13.9* 16.6* 17.9* 18.6*  CREATININE 2.10* 1.97* 1.84* 1.68* 1.39*    Estimated Creatinine Clearance: 85.9 mL/min (by C-G formula based on Cr of 1.39).    No Known Allergies  Antimicrobials this admission: Cefepime 4/29 >>    >>   Arlean HoppingCorey M. Newman PiesBall, PharmD, BCPS Clinical Pharmacist Pager [phone]  03/06/2016 1:44 PM

## 2016-03-06 NOTE — Progress Notes (Signed)
5 Days Post-Op  Subjective: No changes overnight   Objective: Vital signs in last 24 hours: Temp:  [97.9 F (36.6 C)-98.7 F (37.1 C)] 97.9 F (36.6 C) (04/29 0800) Pulse Rate:  [69-96] 84 (04/29 0800) Resp:  [5-32] 18 (04/29 0800) BP: (91-120)/(50-75) 106/65 mmHg (04/29 0800) SpO2:  [95 %-100 %] 98 % (04/29 0800) FiO2 (%):  [30 %] 30 % (04/29 0733) Weight:  [127.7 kg (281 lb 8.4 oz)] 127.7 kg (281 lb 8.4 oz) (04/29 0500) Last BM Date: 03/05/16  Intake/Output from previous day: 04/28 0701 - 04/29 0700 In: 6120 [I.V.:3320; NG/GT:2640] Out: 3440 [Urine:3260; Drains:30; Chest Tube:150] Intake/Output this shift: Total I/O In: -  Out: 450 [Urine:450]  On vent by trach Lungs with rhonchi bilaterally Abdomen with VAC, drains.  Soft, obese  Lab Results:   Recent Labs  03/05/16 0631 03/06/16 0550  WBC 17.9* 18.6*  HGB 7.3* 7.2*  HCT 23.9* 24.2*  PLT 297 298   BMET  Recent Labs  03/05/16 0631 03/06/16 0550  NA 151* 152*  K 3.4* 3.8  CL 112* 115*  CO2 27 28  GLUCOSE 179* 155*  BUN 156* 129*  CREATININE 1.68* 1.39*  CALCIUM 8.4* 8.2*   PT/INR No results for input(s): LABPROT, INR in the last 72 hours. ABG No results for input(s): PHART, HCO3 in the last 72 hours.  Invalid input(s): PCO2, PO2  Studies/Results: Dg Chest Port 1 View  03/05/2016  CLINICAL DATA:  Hemothorax. EXAM: PORTABLE CHEST 1 VIEW COMPARISON:  03/03/2016. FINDINGS: Tracheostomy tube, right PICC line, right chest tube in stable position. Heart size stable. Diffuse right lung infiltrate and right pleural effusion noted. No pneumothorax. IMPRESSION: 1. Tracheostomy tube, right PICC line, right chest tube in stable position. No pneumothorax. 2. Persistent diffuse right lung infiltrate and right pleural effusion, no change. 3.  Multiple displaced right rib fractures again noted . Electronically Signed   By: Maisie Fushomas  Register   On: 03/05/2016 07:39    Anti-infectives: Anti-infectives    Start      Dose/Rate Route Frequency Ordered Stop   03/01/16 1330  ceFAZolin (ANCEF) IVPB 2g/100 mL premix     2 g 200 mL/hr over 30 Minutes Intravenous To ShortStay Surgical 02/29/16 0917 03/01/16 1452   02/11/16 0600  piperacillin-tazobactam (ZOSYN) IVPB 3.375 g  Status:  Discontinued     3.375 g 100 mL/hr over 30 Minutes Intravenous 4 times per day 02/10/16 2214 02/22/16 0853   02/10/16 1730  piperacillin-tazobactam (ZOSYN) IVPB 3.375 g  Status:  Discontinued     3.375 g 12.5 mL/hr over 240 Minutes Intravenous 3 times per day 02/10/16 1727 02/10/16 2214      Assessment/Plan: s/p Procedure(s): ABDOMINAL WOUND CLOSURE (N/Bell) PERCUTANEOUS TRACHEOSTOMY (N/Bell)  S/p jump from building with multiple injuries  WBC continuing to increase.  If has fever with pan culture.  May have to consider abd CT CCM helping with vent management Continue all current care  LOS: 26 days    [name] Bell 03/06/2016

## 2016-03-07 ENCOUNTER — Inpatient Hospital Stay (HOSPITAL_COMMUNITY): Payer: Medicaid Other

## 2016-03-07 LAB — GLUCOSE, CAPILLARY
GLUCOSE-CAPILLARY: 128 mg/dL — AB (ref 65–99)
GLUCOSE-CAPILLARY: 134 mg/dL — AB (ref 65–99)
GLUCOSE-CAPILLARY: 146 mg/dL — AB (ref 65–99)
GLUCOSE-CAPILLARY: 145 mg/dL — AB (ref 65–99)
GLUCOSE-CAPILLARY: 137 mg/dL — AB (ref 65–99)
Glucose-Capillary: 144 mg/dL — ABNORMAL HIGH (ref 65–99)
Glucose-Capillary: 145 mg/dL — ABNORMAL HIGH (ref 65–99)

## 2016-03-07 NOTE — Progress Notes (Signed)
      [address]       [place] [id number]             [phone]      Sedated on vent BP 102/62 mmHg  Pulse 86  Temp(Src) 98.6 F (37 C) (Axillary)  Resp 25  Ht 5\' 11"  (1.803 m)  Wt 283 lb 8.2 oz (128.6 kg)  BMI 39.56 kg/m2  SpO2 100%  LMP  (LMP Unknown)   Intake/Output Summary (Last 24 hours) at 03/07/16 1048 Last data filed at 03/07/16 1046  Gross per 24 hour  Intake   5580 ml  Output   3860 ml  Net   1720 ml   CXR unchanged right hemothorax  CT not draining much  No changes for now  BallplaySteven C. Dorris FetchHendrickson, MD [facility] [phone]

## 2016-03-07 NOTE — Progress Notes (Signed)
Trauma Service Note  Subjective: Tolerating tube feeds,   Objective: Vital signs in last 24 hours: Temp:  [97.8 F (36.6 C)-98.7 F (37.1 C)] 98.6 F (37 C) (04/30 0814) Pulse Rate:  [79-91] 86 (04/30 0814) Resp:  [10-28] 25 (04/30 0814) BP: (94-112)/(54-72) 102/62 mmHg (04/30 0814) SpO2:  [97 %-100 %] 100 % (04/30 0814) FiO2 (%):  [30 %] 30 % (04/30 0814) Weight:  [128.6 kg (283 lb 8.2 oz)] 128.6 kg (283 lb 8.2 oz) (04/30 0600) Last BM Date: 03/06/16  Intake/Output from previous day: 04/29 0701 - 04/30 0700 In: 6165 [I.V.:3235; NG/GT:2540; IV Piggyback:150] Out: 4403 [Urine:3885; Drains:50; Chest Tube:50] Intake/Output this shift: Total I/O In: 415 [I.V.:15; NG/GT:400] Out: 350 [Urine:350]  General: intubated, sedated  Lungs: coarse b/l, assisted  Abd: vac in place no edema, tube feeds going  Extremities: no edema  Neuro: GCS 3t  Lab Results: CBC   Recent Labs  03/05/16 0631 03/06/16 0550  WBC 17.9* 18.6*  HGB 7.3* 7.2*  HCT 23.9* 24.2*  PLT 297 298   BMET  Recent Labs  03/05/16 0631 03/06/16 0550  NA 151* 152*  K 3.4* 3.8  CL 112* 115*  CO2 27 28  GLUCOSE 179* 155*  BUN 156* 129*  CREATININE 1.68* 1.39*  CALCIUM 8.4* 8.2*   PT/INR No results for input(s): LABPROT, INR in the last 72 hours. ABG No results for input(s): PHART, HCO3 in the last 72 hours.  Invalid input(s): PCO2, PO2  Studies/Results: No results found.  Anti-infectives: Anti-infectives    Start     Dose/Rate Route Frequency Ordered Stop   03/06/16 1400  ceFEPIme (MAXIPIME) 2 g in dextrose 5 % 50 mL IVPB     2 g 100 mL/hr over 30 Minutes Intravenous Every 12 hours 03/06/16 1344 03/11/16 1359   03/01/16 1330  ceFAZolin (ANCEF) IVPB 2g/100 mL premix     2 g 200 mL/hr over 30 Minutes Intravenous To ShortStay Surgical 02/29/16 0917 03/01/16 1452   02/11/16 0600  piperacillin-tazobactam (ZOSYN) IVPB 3.375 g  Status:  Discontinued     3.375 g 100 mL/hr over 30 Minutes  Intravenous 4 times per day 02/10/16 2214 02/22/16 0853   02/10/16 1730  piperacillin-tazobactam (ZOSYN) IVPB 3.375 g  Status:  Discontinued     3.375 g 12.5 mL/hr over 240 Minutes Intravenous 3 times per day 02/10/16 1727 02/10/16 2214      Medications Scheduled Meds: . alteplase  2 mg Intracatheter Once  . antiseptic oral rinse  7 mL Mouth Rinse 10 times per day  . bisacodyl  10 mg Rectal Daily  . ceFEPime (MAXIPIME) IV  2 g Intravenous Q12H  . chlorhexidine gluconate (SAGE KIT)  15 mL Mouth Rinse BID  . diazepam  5 mg Oral Q6H  . enoxaparin (LOVENOX) injection  30 mg Subcutaneous Q12H  . escitalopram  10 mg Per Tube Daily  . feeding supplement (PIVOT 1.5 CAL)  1,000 mL Per Tube Q24H  . feeding supplement (PRO-STAT SUGAR FREE 64)  60 mL Per Tube TID  . fentaNYL  100 mcg Transdermal Q72H  . free water  400 mL Per Tube Q4H  . insulin aspart  0-20 Units Subcutaneous 6 times per day  . pantoprazole  40 mg Oral Daily   Or  . pantoprazole (PROTONIX) IV  40 mg Intravenous Daily  . sodium chloride flush  10-40 mL Intracatheter Q12H   Continuous Infusions: . dextrose 1,000 mL (03/07/16 0436)  . fentaNYL infusion INTRAVENOUS 75 mcg/hr (03/07/16  0800)   PRN Meds:.fentaNYL, ipratropium-albuterol, LORazepam, ondansetron **OR** ondansetron (ZOFRAN) IV, sodium chloride flush  Assessment/Plan: Jump from hotel/multiple toxic ingestions - on SSRI, sitter S/P ex lap, hepatorraphy, SBR, cholecystectomy, preperitoneal pelvic packing [name] 4/3  Ex lap for acidosis/bleeding 4/4 Thompson Ex lap for bleeding 4/5 [name] Possible celiac artery intimal injury - bowel viable on explorations S/P ex fix pelvis [name] 4/3 S/P ex lap 4/7 Thompson S/P ex lap 4/10 Thompson S/P sacral screws 5/92 [name] S/P application ABRA 9/24 [name]  S/P abd closure, trach 4/24 [name] Resp failure - continue vent per PCCM Mult B rib FX/R HPTX, B pulm contusions - Rt CT placed 4/20 by TCTS, CT chest shows residual R  effusion, CT pulled back. Continue CT per TCTS,  -continued opacity on imaging, sentinel hole not in chest Multiple toxic ingestions Mult L wrist FXs - splint for now Pelvic FX - S/P ex fix and angioembolization, S/P sacral screw by Dr. Marcelino Scot. Plan ex fix as definitive treatment. Hepatic failure - Stable AKI - Improving ABL anemia - stable Hyperglycemia - SSI ID - Watch rising WBC, still afeb FEN - continue tube feeds VTE - SCD's, Lovenox  Dispo - ICU  LOS: 27 days   [place] Surgeon [phone] Surgery 03/07/2016

## 2016-03-07 NOTE — Progress Notes (Signed)
PULMONARY / CRITICAL CARE MEDICINE   Name: [name] Bell MRN: [id number] DOB: 1981/03/26    ADMISSION DATE:  02/09/2016 CONSULTATION DATE:  03/05/2016  REFERRING MD :  Trauma  CHIEF COMPLAINT:  Ventilator managment  INITIAL PRESENTATION:  35 yo female admitted on 4/3 after she jumped from her 5th floor hotel room. She was brought to the ED as a level 1 trauma secondary to hypotension. She c/o abd pain. She was conscious, denied medical history, admitted to drinking a liquid drain remover prior to jumping. She was intubated in the ED.  She suffered : Multiple right rib fxs w/HPTX s/p CT Hemoperitoneum -- to OR for ex lap Multiple pelvic fxs Hypovolemic shock Left forearm lac Right ankle abrasions/swelling Multiple contusions ARF  SIGNIFICANT EVENTS: Failing pressure support   SUBJECTIVE:   VITAL SIGNS: Temp:  [98 F (36.7 C)-98.7 F (37.1 C)] 98.6 F (37 C) (04/30 0814) Pulse Rate:  [81-91] 85 (04/30 1135) Resp:  [13-28] 23 (04/30 1135) BP: (94-112)/(54-72) 104/62 mmHg (04/30 1135) SpO2:  [99 %-100 %] 100 % (04/30 1135) FiO2 (%):  [30 %] 30 % (04/30 1135) Weight:  [128.6 kg (283 lb 8.2 oz)] 128.6 kg (283 lb 8.2 oz) (04/30 0600) HEMODYNAMICS:   VENTILATOR SETTINGS: Vent Mode:  [-] PRVC FiO2 (%):  [30 %] 30 % Set Rate:  [20 bmp] 20 bmp Vt Set:  [490 mL] 490 mL PEEP:  [5 cmH20] 5 cmH20 Plateau Pressure:  [20 cmH20-25 cmH20] 20 cmH20 INTAKE / OUTPUT:  Intake/Output Summary (Last 24 hours) at 03/07/16 1212 Last data filed at 03/07/16 1046  Gross per 24 hour  Intake   4810 ml  Output   3600 ml  Net   1210 ml    PHYSICAL EXAMINATION: General:  On vent, sedated HEENT: Trach in place PULM: diminished on R, rhonchi bilaterally, vent supported breaths CV: RRR, no mgr GI: BS+, soft, nontender MSK: normal bulk and tone Neuro: sedated, stirs to touch  LABS:  CBC  Recent Labs Lab 03/04/16 0654 03/05/16 0631 03/06/16 0550  WBC 16.6* 17.9* 18.6*  HGB 7.5*  7.3* 7.2*  HCT 24.5* 23.9* 24.2*  PLT 309 297 298   Coag's No results for input(s): APTT, INR in the last 168 hours. BMET  Recent Labs Lab 03/04/16 0654 03/05/16 0631 03/06/16 0550  NA 157* 151* 152*  K 2.9* 3.4* 3.8  CL 117* 112* 115*  CO2 29 27 28   BUN 164* 156* 129*  CREATININE 1.84* 1.68* 1.39*  GLUCOSE 187* 179* 155*   Electrolytes  Recent Labs Lab 03/04/16 0654 03/05/16 0631 03/06/16 0550  CALCIUM 8.3* 8.4* 8.2*   Sepsis Markers No results for input(s): LATICACIDVEN, PROCALCITON, O2SATVEN in the last 168 hours. ABG No results for input(s): PHART, PCO2ART, PO2ART in the last 168 hours. Liver Enzymes  Recent Labs Lab 03/02/16 1011 03/05/16 0631  AST 216* 97*  ALT 100* 43  ALKPHOS 89 99  BILITOT 10.3* 7.1*  ALBUMIN 1.5* 1.4*   Cardiac Enzymes No results for input(s): TROPONINI, PROBNP in the last 168 hours. Glucose  Recent Labs Lab 03/06/16 1141 03/06/16 1545 03/06/16 1943 03/07/16 0020 03/07/16 0320 03/07/16 0747  GLUCAP 171* 141* 203* 145* 144* 128*    Imaging Dg Chest Port 1 View  03/07/2016  CLINICAL DATA:  35 year old female with history of pleural effusion. EXAM: PORTABLE CHEST 1 VIEW COMPARISON:  Chest x-ray 03/05/2016. FINDINGS: A tracheostomy tube is in place with tip 5.9 cm above the carina. There is a right upper  extremity PICC with tip terminating in the superior cavoatrial junction. Right-sided chest tube in position with tip in the medial aspect of the right hemithorax and side port projecting just lateral to the bony thorax. Large right-sided pleural fluid collection. Probable passive atelectasis in much of the right lung. Minimal linear opacities at the left lung base, favored to reflect subsegmental atelectasis. No left pleural effusion. No evidence of pulmonary edema. Heart size appears normal. Numerous right-sided rib fractures are noted, several which are segmental and widely displaced. No pneumothorax. IMPRESSION: 1. Support  apparatus, as above. 2. Persistent large right-sided pleural fluid collection which likely represents hemothorax in this patient with history of significant right-sided thoracic trauma as evidenced by numerous displaced and segmental right-sided rib fractures. 3. This is associated with substantial passive atelectasis in the right lung. 4. Resolving subsegmental atelectasis in the left lower lobe. Electronically Signed   By: Trudie Reed M.D.   On: 03/07/2016 09:41     ASSESSMENT / PLAN: 35 yo AAF admitted as Level 1 trauma after suicide attempt drinking drano and jumping from 5th story of hotel suffered abd/pelvic fracture, rib fracture, now with trach and peg with trach breakdown.  PULMONARY A: Chronic respiratory failure/vent dependence  Tracheostomy R Rib fractures Large R pleural effusion/hemothorax? HCAP?  P:   See ID Continue full vent support (failed pressure support 18/5 for me) VAP bundle Chest tube per TCTS Would consider decortication of R pleural effusion/hemothorax   INFECTIOUS A:   Enterobacter respiratory secretions> worrisome for HCAP given thick secretions, failure to wean, rising WBC  P:   Continue cefepime, plan 5 days  My cc time 30 minutes  Updated sister bedside  Heber [place], MD [place] [facility] Pager: [phone] Cell: [phone] After 3pm or if no response, call [phone]   03/07/2016, 12:12 PM

## 2016-03-08 ENCOUNTER — Inpatient Hospital Stay (HOSPITAL_COMMUNITY): Payer: Medicaid Other

## 2016-03-08 ENCOUNTER — Encounter (HOSPITAL_COMMUNITY): Payer: Self-pay | Admitting: General Surgery

## 2016-03-08 LAB — GLUCOSE, CAPILLARY
GLUCOSE-CAPILLARY: 138 mg/dL — AB (ref 65–99)
GLUCOSE-CAPILLARY: 132 mg/dL — AB (ref 65–99)
GLUCOSE-CAPILLARY: 129 mg/dL — AB (ref 65–99)
Glucose-Capillary: 147 mg/dL — ABNORMAL HIGH (ref 65–99)
Glucose-Capillary: 147 mg/dL — ABNORMAL HIGH (ref 65–99)

## 2016-03-08 LAB — CBC
HEMATOCRIT: 23.8 % — AB (ref 36.0–46.0)
HEMOGLOBIN: 7.2 g/dL — AB (ref 12.0–15.0)
MCH: 30.8 pg (ref 26.0–34.0)
MCHC: 30.3 g/dL (ref 30.0–36.0)
MCV: 101.7 fL — AB (ref 78.0–100.0)
Platelets: 346 10*3/uL (ref 150–400)
RBC: 2.34 MIL/uL — ABNORMAL LOW (ref 3.87–5.11)
RDW: 24.1 % — AB (ref 11.5–15.5)
WBC: 24.3 10*3/uL — ABNORMAL HIGH (ref 4.0–10.5)

## 2016-03-08 LAB — BASIC METABOLIC PANEL
Anion gap: 10 (ref 5–15)
BUN: 101 mg/dL — AB (ref 6–20)
CALCIUM: 8.2 mg/dL — AB (ref 8.9–10.3)
CHLORIDE: 111 mmol/L (ref 101–111)
CO2: 25 mmol/L (ref 22–32)
CREATININE: 1.14 mg/dL — AB (ref 0.44–1.00)
GFR calc Af Amer: >60 mL/min (ref >60)
GFR calc non Af Amer: >60 mL/min (ref >60)
GLUCOSE: 164 mg/dL — AB (ref 65–99)
Potassium: 3.7 mmol/L (ref 3.5–5.1)
Sodium: 146 mmol/L — ABNORMAL HIGH (ref 135–145)

## 2016-03-08 MED ORDER — FENTANYL CITRATE (PF) 100 MCG/2ML IJ SOLN
50.0000 ug | INTRAMUSCULAR | Status: DC | PRN
  Administered 2016-03-08 – 2016-03-09 (×4): 100 ug via INTRAVENOUS
  Administered 2016-03-09: 50 ug via INTRAVENOUS
  Administered 2016-03-09 – 2016-03-10 (×3): 100 ug via INTRAVENOUS
  Filled 2016-03-08 (×8): qty 2

## 2016-03-08 MED ORDER — QUETIAPINE FUMARATE 100 MG PO TABS
100.0000 mg | ORAL_TABLET | Freq: Three times a day (TID) | ORAL | Status: DC
  Administered 2016-03-08 (×3): 100 mg
  Filled 2016-03-08 (×4): qty 1

## 2016-03-08 MED ORDER — CLONAZEPAM 0.5 MG PO TBDP
1.0000 mg | ORAL_TABLET | Freq: Three times a day (TID) | ORAL | Status: DC
  Administered 2016-03-08 (×3): 1 mg
  Filled 2016-03-08 (×3): qty 2

## 2016-03-08 MED ORDER — LEVOFLOXACIN IN D5W 750 MG/150ML IV SOLN
750.0000 mg | INTRAVENOUS | Status: AC
  Administered 2016-03-08 – 2016-03-10 (×3): 750 mg via INTRAVENOUS
  Filled 2016-03-08 (×3): qty 150

## 2016-03-08 NOTE — Progress Notes (Addendum)
Nutrition Follow-up  DOCUMENTATION CODES:   Obesity unspecified  INTERVENTION:   Continue: Pivot 1.5 @ 30 ml/hr 60 ml Prostat TID Provides: 1680 kcal, 157 grams protein, and 546 ml H2O.   NUTRITION DIAGNOSIS:   Increased nutrient needs related to wound healing, catabolic illness as evidenced by estimated needs. Ongoing.   GOAL:   Provide needs based on ASPEN/SCCM guidelines Met.   MONITOR:   Skin, I & O's, Vent status, Labs, TF tolerance  ASSESSMENT:   Pt from [place] with no past medical hx admitted after suicide attempt by ingestion of Drano, 2 bottles of acetaminophen, nyquil, and possibly 6 alprazolam pills and jumping from 5th floor balcony of hotel. 4/3 pt is s/p exp lap, hepatorraphy, SBR, cholecystectomy, preperitoneal pelvic packing, ex fix pelvis, R HPTX, Bil pulmonary contusions. Plan for repeat OR 4/5 for another possible SBR. Abdomen remains open.   4/7 s/p exp lap, abd VAC dressing change 4/7 TF started 4/9 Pivot 1.5 reached goal of 30 ml/hr, prostat added 60 ml TID 4/10 repeat OR with washout, abdomen left open with negative pressure wound dressing (VAC), sacral screws 4/12 PEG, ABRA placed 4/14 screw fixation at S1/S2 4/24 abd closed  Pt with trach on ventilator support MV: 13.3 L/min Temp (24hrs), Avg:99.1 F (37.3 C), Min:98.8 F (37.1 C), Max:99.6 F (37.6 C)  Pt discussed during ICU rounds and with RN.  Pt has been unable to wean from vent.  Medications reviewed and include: dulcolax Labs reviewed: sodium elevated 146, CBG's: 138-147   Diet Order:  Diet NPO time specified  Skin:  Wound (see comment) (stage II hip, surgical incisions, lacerations)  Last BM:  5/1  Height:   Ht Readings from Last 1 Encounters:  02/09/16 _0  (1.803 m)    Weight:   Wt Readings from Last 1 Encounters:  03/08/16 285 lb 4.4 oz (129.4 kg)    Ideal Body Weight:  70.4 kg  BMI:  Body mass index is 39.81 kg/(m^2).  Estimated Nutritional Needs:   Kcal:   5831-6742  Protein:  >/= 140 grams  Fluid:  > 2 L/day  EDUCATION NEEDS:   No education needs identified at this time  [place], [place], [place] Pager [phone] After Hours Pager

## 2016-03-08 NOTE — Clinical Social Work Note (Signed)
Clinical Social Worker continuing to follow patient and family for support.  Patient with a trach, however unable to successfully wean to trach collar.  Patient sister irritated at bedside.  Patient not following commands, or responding in any type of way.  CSW remains available for support at this time.  Macario GoldsJesse [place], [place] [phone]

## 2016-03-08 NOTE — Evaluation (Signed)
Physical Therapy Evaluation Patient Details Name: [name] Bell MRN: [id number] DOB: [birth year] Today's Date: 03/08/2016   History of Present Illness  [name] Bell is a 35 y.o. female.She is obese. PMH unknown.Pt attempted suicide: taped DNR note to chest, drank Drano, then [place] [address] [place]S/p 02/09/16 gel foam embolization of bil internal iliac arteries.S/p 02/09/16 ex lap with cholecystectomy, resection SB and mesenteric repair, liver lac repair, application or wound vac.S/p 02/09/16 closed reduction of pelvic ring, external pelvic fixation.Also has right metacarpal fracture in splint. Pt intubated via trach.  Clinical Impression  Pt admitted with above. Pt with increased level of alertness upon sitting EOB and was able to follow 1 step commands and communicate with PT via shaking head yes and no appropriately. Pt remains bilat NWB and extremely limited with EOB sitting due to pelvic ring. Recommend using lift equip to get to chair which will need to be in reclined position due to this pelvic ex fix ring. Acute PT to follow and progress mobility.    Follow Up Recommendations CIR;Supervision/Assistance - 24 hour    Equipment Recommendations  Wheelchair (measurements PT);Wheelchair cushion (measurements PT)    Recommendations for Other Services Rehab consult     Precautions / Restrictions Precautions Precautions: Sternal;Other (comment) Precaution Comments: pelvic ex fix, multiple abdominal wounds with drains, wound vac, trach Required Braces or Orthoses: Other Brace/Splint Other Brace/Splint: L wrist in splint Restrictions Weight Bearing Restrictions: Yes LUE Weight Bearing: Non weight bearing RLE Weight Bearing: Non weight bearing LLE Weight Bearing: Non weight bearing      Mobility  Bed Mobility Overal bed mobility: Needs Assistance;+2 for physical assistance;+ 2 for safety/equipment Bed Mobility: Supine to Sit;Sit to Supine     Supine to  sit: Total assist;+2 for physical assistance;+2 for safety/equipment Sit to supine: Total assist;+2 for physical assistance;+2 for safety/equipment   General bed mobility comments: pt with no initiation of transfer. dependent for LE management and trunk elevation  Transfers                 General transfer comment: discussed with RN staff using lift to get to reclined chair  Ambulation/Gait                Stairs            Wheelchair Mobility    Modified Rankin (Stroke Patients Only)       Balance Overall balance assessment: Needs assistance Sitting-balance support: Feet supported;Single extremity supported Sitting balance-[name]: Zero Sitting balance - Comments: requires maxA posteriorly to maintain sitting EOB balance Postural control: Posterior lean (due to weakness and pelvic ex fix)                                   Pertinent Vitals/Pain Pain Assessment: Faces Faces Pain Scale: Hurts even more Pain Location: unsure, suspect pelvis/abdominal area, grimaced and opened mouth during transition from supine to sit Pain Descriptors / Indicators: Grimacing Pain Intervention(s): Monitored during session    Home Living Family/patient expects to be discharged to:: Unsure                 Additional Comments: pt non-verbal unable to report where she lives, per chart pt recently located to [place] 3 weeks ago    Prior Function Level of Independence: Independent               Hand Dominance  Extremity/Trunk Assessment   Upper Extremity Assessment: RUE deficits/detail;LUE deficits/detail RUE Deficits / Details: pt did participate in AAROM but grossly 2+/5. pt did attempt to move on own while sitting eob     LUE Deficits / Details: participate in AAROM however noted to be weaker than R UE, no wrist ROM due to splint, less active L UE mvmt   Lower Extremity Assessment: Generalized weakness (pt able to wiggle toes bil  unable to complete LAQ, did AAROM)      Cervical / Trunk Assessment: Other exceptions (lots of abdominal drains and wounds, pelvic ring)  Communication   Communication: Tracheostomy;Other (comment) (once in sitting pt able to nod head yes/no)  Cognition Arousal/Alertness: Lethargic Behavior During Therapy: Flat affect Overall Cognitive Status: Impaired/Different from baseline Area of Impairment: Following commands       Following Commands: Follows one step commands inconsistently       General Comments: pt did not open eyes until pt was sitting EOB, pt then began to follow commands fairly consistently and was able to communicate with PT via head nodding. pt had no command follow while supine in bed just grimace to noxious stimuli x 4 extremities    General Comments General comments (skin integrity, edema, etc.): pt able to recognize a picture of sister. did open eyes and track while sitting EOB    Exercises        Assessment/Plan    PT Assessment Patient needs continued PT services  PT Diagnosis Generalized weakness;Difficulty walking;Acute pain   PT Problem List Decreased strength;Decreased range of motion;Decreased activity tolerance;Decreased balance;Decreased mobility;Decreased coordination;Decreased cognition  PT Treatment Interventions DME instruction;Gait training;Stair training;Therapeutic activities;Functional mobility training;Neuromuscular re-education;Balance training;Therapeutic exercise;Cognitive remediation;Patient/family education;Wheelchair mobility training   PT Goals (Current goals can be found in the Care Plan section) Acute Rehab PT Goals Patient Stated Goal: didn't state PT Goal Formulation: With patient Time For Goal Achievement: 03/22/16 Potential to Achieve Goals: Good    Frequency Min 4X/week   Barriers to discharge Other (comment) (unsure)      Co-evaluation               End of Session   Activity Tolerance: Patient tolerated  treatment well Patient left: in bed;with call bell/phone within reach;with family/visitor present Nurse Communication: Mobility status (RN present and assisted with transfer)         Time: 1553-1620 PT Time Calculation (min) (ACUTE ONLY): 27 min   Charges:   PT Evaluation $PT Eval High Complexity: 1 Procedure PT Treatments $Therapeutic Activity: 8-22 mins   PT G CodesMarcene Brawn 03/08/2016, 4:48 PM   Lewis Shock, PT, DPT Pager #: [phone] Office #: [phone]

## 2016-03-08 NOTE — Progress Notes (Addendum)
SLP Cancellation Note  Patient Details Name: Phylliss BobKatia D Markoff MRN: [id number] DOB: 08-14-1981   Cancelled treatment:        Pt continues on vent. Will follow along.   Royce MacadamiaLitaker, Farhana Fellows Willis 03/08/2016, 1:50 PM   Breck CoonsLisa Willis Lonell FaceLitaker M.Ed [employer]-SLP Pager [phone]

## 2016-03-08 NOTE — Progress Notes (Signed)
Rehab Admissions Coordinator Note:  Patient was screened by Clois DupesBoyette, Burnette Sautter Godwin for appropriateness for an Inpatient Acute Rehab Consult per PT recommendation. Noted pt with sternal precautions as well as NWB LUE, and BLE.Very limited in ability to tolerate intense rehab with these restrictions.Continues on vent at this time with trach. SNF recommended due to these restrictions. Please call me with any questions.  Clois DupesBoyette, Cozette Braggs Godwin 03/08/2016, 6:31 PM  I can be reached at [phone].

## 2016-03-08 NOTE — Progress Notes (Signed)
Follow up - Trauma and Critical Care  Patient Details:    [name] Bell is an 35 y.o. female.  Lines/tubes : PICC Double Lumen 03/02/16 PICC Right Cephalic 50 cm 0 cm (Active)  Indication for Insertion or Continuance of Line Vasoactive infusions 03/07/2016  7:08 PM  Exposed Catheter (cm) 0 cm 03/01/2016  8:00 AM  Site Assessment Clean;Dry;Intact 03/07/2016  7:08 PM  Lumen #1 Status Infusing 03/07/2016  7:08 PM  Lumen #2 Status Infusing 03/07/2016  7:08 PM  Dressing Type Transparent 03/07/2016  7:08 PM  Dressing Status Clean;Dry;Intact;Antimicrobial disc in place 03/07/2016  7:08 PM  Line Care Connections checked and tightened 03/07/2016  7:08 PM  Dressing Change Due 03/09/16 03/07/2016  7:08 PM     Chest Tube 1 Right Pleural 36 Fr. (Active)  Suction -20 cm H2O 03/07/2016  8:00 PM  Chest Tube Air Leak None 03/07/2016  8:00 PM  Patency Intervention Milked 03/07/2016  8:00 PM  Drainage Description Dark red 03/07/2016  8:00 PM  Dressing Status Clean;Dry;Intact 03/07/2016  8:00 PM  Dressing Intervention Other (Comment) 03/07/2016  8:00 PM  Site Assessment Intact 03/07/2016  8:00 PM  Surrounding Skin Unable to view 03/07/2016  8:00 PM  Output (mL) 30 mL 03/08/2016  6:00 AM     Negative Pressure Wound Therapy Abdomen Mid (Active)  Last dressing change 03/03/16 03/07/2016  8:00 AM  Site / Wound Assessment Clean;Dry 03/07/2016  8:00 PM  Peri-wound Assessment Intact 03/07/2016  8:00 PM  Size 33 cm 03/03/2016  4:00 PM  Wound filler - Black foam 1 03/07/2016  8:00 AM  Cycle Continuous 03/07/2016  8:00 PM  Target Pressure (mmHg) 125 03/07/2016  8:00 PM  Canister Changed No 03/07/2016  8:00 PM  Dressing Status Intact 03/07/2016  8:00 PM  Drainage Amount Scant 03/07/2016  8:00 PM  Drainage Description Serosanguineous 03/07/2016  8:00 PM  Output (mL) 20 mL 03/08/2016  6:00 AM     Gastrostomy/Enterostomy Gastrostomy 24 Fr. LUQ (Active)  Surrounding Skin Intact 03/07/2016  8:00 PM  Tube Status Patent 03/07/2016  8:00 PM   Drainage Appearance Purulent;Yellow 03/07/2016 12:00 PM  Dressing Status Clean;Dry;Intact 03/07/2016  8:00 PM  Dressing Intervention Other (Comment) 03/07/2016  8:00 PM  Dressing Type Split gauze 03/07/2016  8:00 PM  G Port Intake (mL) 30 ml 03/06/2016  6:00 PM  Output (mL) 100 mL 02/18/2016  6:20 PM     Urethral Catheter S. Bowman,RN Latex 16 Fr. (Active)  Indication for Insertion or Continuance of Catheter Unstable critical patients (first 24-48 hours);Peri-operative use for selective surgical procedure 03/07/2016  8:00 PM  Site Assessment Intact;Swelling 03/07/2016  8:00 PM  Catheter Maintenance Bag below level of bladder;Catheter secured;Drainage bag/tubing not touching floor;Insertion date on drainage bag;No dependent loops;Seal intact 03/07/2016  8:00 PM  Collection Container Standard drainage bag 03/07/2016  8:00 PM  Securement Method Securing device (Describe) 03/07/2016  8:00 PM  Urinary Catheter Interventions Unclamped 03/07/2016  8:00 PM  Output (mL) 500 mL 03/08/2016  6:00 AM    Microbiology/Sepsis markers: Results for orders placed or performed during the hospital encounter of 02/09/16  MRSA PCR Screening     Status: None   Collection Time: 02/11/16 11:30 AM  Result Value Ref Range Status   MRSA by PCR NEGATIVE NEGATIVE Final    Comment:        The GeneXpert MRSA Assay (FDA approved for NASAL specimens only), is one component of a comprehensive MRSA colonization surveillance program. It is not intended to  diagnose MRSA infection nor to guide or monitor treatment for MRSA infections.   Anaerobic culture     Status: None   Collection Time: 02/26/16  1:19 PM  Result Value Ref Range Status   Specimen Description FLUID PLEURAL RIGHT  Final   Special Requests POF ANCEF  Final   Gram Stain   Final    DEGENERATED CELLULAR MATERIAL PRESENT NO ORGANISMS SEEN    Culture NO ANAEROBES ISOLATED  Final   Report Status 03/02/2016 FINAL  Final  Body fluid culture     Status: None    Collection Time: 02/26/16  1:19 PM  Result Value Ref Range Status   Specimen Description FLUID RIGHT PLEURAL  Final   Special Requests POF ANCEF  Final   Gram Stain   Final    DEGENERATED CELLULAR MATERIAL PRESENT NO ORGANISMS SEEN    Culture   Final    MULTIPLE ORGANISMS PRESENT, NONE PREDOMINANT CRITICAL RESULT CALLED TO, READ BACK BY AND VERIFIED WITH: J PINEDA 02/27/16 @ 1314 M VESTAL    Report Status 02/28/2016 FINAL  Final  Culture, respiratory (NON-Expectorated)     Status: None   Collection Time: 03/03/16  1:17 PM  Result Value Ref Range Status   Specimen Description TRACHEAL ASPIRATE  Final   Special Requests Normal  Final   Gram Stain   Final    FEW WBC PRESENT,BOTH PMN AND MONONUCLEAR NO SQUAMOUS EPITHELIAL CELLS SEEN NO ORGANISMS SEEN Performed at [facility]    Culture   Final    FEW ENTEROBACTER CLOACAE Performed at [facility]    Report Status 03/06/2016 FINAL  Final   Organism ID, Bacteria ENTEROBACTER CLOACAE  Final      Susceptibility   Enterobacter cloacae - MIC*    CEFAZOLIN >=64 RESISTANT Resistant     CEFEPIME <=1 SENSITIVE Sensitive     CEFTAZIDIME >=64 RESISTANT Resistant     CEFTRIAXONE >=64 RESISTANT Resistant     CIPROFLOXACIN <=0.25 SENSITIVE Sensitive     GENTAMICIN <=1 SENSITIVE Sensitive     IMIPENEM <=0.25 SENSITIVE Sensitive     PIP/TAZO >=128 RESISTANT Resistant     TOBRAMYCIN <=1 SENSITIVE Sensitive     TRIMETH/SULFA Value in next row Sensitive      <=20 SENSITIVE(NOTE)    * FEW ENTEROBACTER CLOACAE    Anti-infectives:  Anti-infectives    Start     Dose/Rate Route Frequency Ordered Stop   03/06/16 1400  ceFEPIme (MAXIPIME) 2 g in dextrose 5 % 50 mL IVPB     2 g 100 mL/hr over 30 Minutes Intravenous Every 12 hours 03/06/16 1344 03/11/16 1359   03/01/16 1330  ceFAZolin (ANCEF) IVPB 2g/100 mL premix     2 g 200 mL/hr over 30 Minutes Intravenous To [facility] 02/29/16 0917 03/01/16 1452   02/11/16 0600   piperacillin-tazobactam (ZOSYN) IVPB 3.375 g  Status:  Discontinued     3.375 g 100 mL/hr over 30 Minutes Intravenous 4 times per day 02/10/16 2214 02/22/16 0853   02/10/16 1730  piperacillin-tazobactam (ZOSYN) IVPB 3.375 g  Status:  Discontinued     3.375 g 12.5 mL/hr over 240 Minutes Intravenous 3 times per day 02/10/16 1727 02/10/16 2214      Best Practice/Protocols:  VTE Prophylaxis: Lovenox (prophylaxtic dose) and Mechanical GI Prophylaxis: Proton Pump Inhibitor Continous Sedation  Consults: Treatment Team:  Myrene Galas, MD Kerin Perna, MD    Events:  Subjective:    Overnight Issues: Patient has not been  able to wean from the ventilator for the past week.  No improvement over the weekend.  Objective:  Vital signs for last 24 hours: Temp:  [98.5 F (36.9 C)-99.6 F (37.6 C)] 98.9 F (37.2 C) (05/01 0400) Pulse Rate:  [81-102] 90 (05/01 0700) Resp:  [20-31] 20 (05/01 0700) BP: (94-134)/(47-84) 99/54 mmHg (05/01 0700) SpO2:  [92 %-100 %] 99 % (05/01 0700) FiO2 (%):  [30 %] 30 % (05/01 0400) Weight:  [129.4 kg (285 lb 4.4 oz)] 129.4 kg (285 lb 4.4 oz) (05/01 0244)  Hemodynamic parameters for last 24 hours:    Intake/Output from previous day: 04/30 0701 - 05/01 0700 In: 6475 [I.V.:3255; NG/GT:3120; IV Piggyback:100] Out: 3606 [Urine:3460; Drains:70; Stool:1; Chest Tube:75]  Intake/Output this shift:    Vent settings for last 24 hours: Vent Mode:  [-] PRVC FiO2 (%):  [30 %] 30 % Set Rate:  [20 bmp] 20 bmp Vt Set:  [490 mL] 490 mL PEEP:  [5 cmH20] 5 cmH20 Plateau Pressure:  [19 cmH20-36 cmH20] 19 cmH20  Physical Exam:  General: no respiratory distress and will grimace Neuro: oriented, nonfocal exam, RASS 0 and RASS -1 HEENT/Neck: trach-clean, intact and has an inferior pocket which has to be kept clean Resp: clear to auscultation bilaterally CVS: regular rate and rhythm, S1, S2 normal, no murmur, click, rub or gallop GI: soft, nontender, BS WNL, no  r/g and tolerating tube feedings.  Open subcutaneous woudn with NPWD in place Extremities: edema 2+ and external fixator  in place.  Results for orders placed or performed during the hospital encounter of 02/09/16 (from the past 24 hour(s))  Glucose, capillary     Status: Abnormal   Collection Time: 03/07/16 12:17 PM  Result Value Ref Range   Glucose-Capillary 134 (H) 65 - 99 mg/dL  Glucose, capillary     Status: Abnormal   Collection Time: 03/07/16  3:49 PM  Result Value Ref Range   Glucose-Capillary 146 (H) 65 - 99 mg/dL  Glucose, capillary     Status: Abnormal   Collection Time: 03/07/16  8:52 PM  Result Value Ref Range   Glucose-Capillary 145 (H) 65 - 99 mg/dL  Glucose, capillary     Status: Abnormal   Collection Time: 03/07/16 11:21 PM  Result Value Ref Range   Glucose-Capillary 137 (H) 65 - 99 mg/dL  Glucose, capillary     Status: Abnormal   Collection Time: 03/08/16  4:01 AM  Result Value Ref Range   Glucose-Capillary 147 (H) 65 - 99 mg/dL  CBC     Status: Abnormal   Collection Time: 03/08/16  4:10 AM  Result Value Ref Range   WBC 24.3 (H) 4.0 - 10.5 K/uL   RBC 2.34 (L) 3.87 - 5.11 MIL/uL   Hemoglobin 7.2 (L) 12.0 - 15.0 g/dL   HCT 56.2 (L) 13.0 - 86.5 %   MCV 101.7 (H) 78.0 - 100.0 fL   MCH 30.8 26.0 - 34.0 pg   MCHC 30.3 30.0 - 36.0 g/dL   RDW 78.4 (H) 69.6 - 29.5 %   Platelets 346 150 - 400 K/uL  Basic metabolic panel     Status: Abnormal   Collection Time: 03/08/16  4:10 AM  Result Value Ref Range   Sodium 146 (H) 135 - 145 mmol/L   Potassium 3.7 3.5 - 5.1 mmol/L   Chloride 111 101 - 111 mmol/L   CO2 25 22 - 32 mmol/L   Glucose, Bld 164 (H) 65 - 99 mg/dL   BUN 284 (  H) 6 - 20 mg/dL   Creatinine, Ser 9.141.14 (H) 0.44 - 1.00 mg/dL   Calcium 8.2 (L) 8.9 - 10.3 mg/dL   GFR calc non Af Amer >60 >60 mL/min   GFR calc Af Amer >60 >60 mL/min   Anion gap 10 5 - 15  Glucose, capillary     Status: Abnormal   Collection Time: 03/08/16  8:01 AM  Result Value Ref Range    Glucose-Capillary 138 (H) 65 - 99 mg/dL     Assessment/Plan:   NEURO  Altered Mental Status:  agitation and sedation   Plan: Make appropriate adjustments on sedative.  PULM  Atelectasis/collapse (focal and R. ling complete white out almost.  Right chest tube not draining much at all.) Hemothorax (retained hemothorax, may benefit from VATS procedure.)   Plan: Work with CVTS in this patient.  Has enterobacter pneumonia, will change to LEVO for next two days  CARDIO  Sinus Tachycardia   Plan: No specific treatment  RENAL  Polyuria (post ATN diuresis)   Plan: Renal function slowly improving.  BUn at about 100  GI  Hepatic Trauma   Plan: Wound is closed, NPWD in place.Tolerating tube feedings at goal  ID  Pneumonia (hospital acquired (not ventilator-associated) Enterobacter PNA improving.)   Plan: CPM  HEME  Anemia acute blood loss anemia and anemia of critical illness)   Plan: No blood for now.  ENDO No known issues   Plan: CPM  Global Issues  Patient is not able to wean from the ventilator, and I am not sure if this is due to mental status or large retained hemothorax on the right.  That probably does not help regardless.  CVTS considering VATS, but wanted to trea with chest tube originally.  Now the last hole of the CT is out of the thoracic cavity.  Will work on appropriate sedation.    LOS: 28 days   Additional comments:I reviewed the patient's new clinical lab test results. cbc/bmet and I reviewed the patients new imaging test results. cxr  Critical Care Total Time*: 30 Minutes  Alyha Marines 03/08/2016  *Care during the described time interval was provided by me and/or other providers on the critical care team.  I have reviewed this patient's available data, including medical history, events of note, physical examination and test results as part of my evaluation.

## 2016-03-08 NOTE — Progress Notes (Addendum)
PT Cancellation Note  Patient Details Name: [name] Bell MRN: [id number] DOB: August 02, 1981   Cancelled Treatment:    Reason Eval/Treat Not Completed: Patient not medically ready. Pt remains on strict bed rest and intubated. In order for PT to complete eval we will need advanced activity orders. Thank you!  Lewis ShockAshly Jonae Renshaw, PT, DPT Pager #: [phone] Office #: [phone]    Marcene BrawnChadwell, Cecile Guevara Marie 03/08/2016, 2:34 PM

## 2016-03-08 NOTE — Progress Notes (Signed)
100 cc fentanyl gtt wasted in sink with Madie RenoPaul Summerall RN.

## 2016-03-08 NOTE — Progress Notes (Signed)
      [address]       [place] [id number]             [phone]      7 Days Post-Op Procedure(s) (LRB): ABDOMINAL WOUND CLOSURE (N/A) PERCUTANEOUS TRACHEOSTOMY (N/A)   Subjective:  Sedated on vent  Objective: Vital signs in last 24 hours: Temp:  [98.5 F (36.9 C)-99.6 F (37.6 C)] 98.9 F (37.2 C) (05/01 0400) Pulse Rate:  [81-102] 90 (05/01 0700) Cardiac Rhythm:  [-] Sinus tachycardia (05/01 0400) Resp:  [18-31] 20 (05/01 0700) BP: (94-134)/(47-84) 99/54 mmHg (05/01 0700) SpO2:  [92 %-100 %] 99 % (05/01 0700) FiO2 (%):  [30 %] 30 % (05/01 0400) Weight:  [285 lb 4.4 oz (129.4 kg)] 285 lb 4.4 oz (129.4 kg) (05/01 0244)  Intake/Output from previous day: 04/30 0701 - 05/01 0700 In: 6475 [I.V.:3255; NG/GT:3120; IV Piggyback:100] Out: 3606 [Urine:3460; Drains:70; Stool:1; Chest Tube:75]  General appearance: sedated on vent Heart: regular rate and rhythm Lungs: diminished breath sounds on right Wound: clean and dry  Lab Results:  Recent Labs  03/06/16 0550 03/08/16 0410  WBC 18.6* 24.3*  HGB 7.2* 7.2*  HCT 24.2* 23.8*  PLT 298 346   BMET:  Recent Labs  03/06/16 0550 03/08/16 0410  NA 152* 146*  K 3.8 3.7  CL 115* 111  CO2 28 25  GLUCOSE 155* 164*  BUN 129* 101*  CREATININE 1.39* 1.14*  CALCIUM 8.2* 8.2*    PT/INR: No results for input(s): LABPROT, INR in the last 72 hours. ABG    Component Value Date/Time   PHART 7.508* 02/28/2016 0642   HCO3 25.5* 02/28/2016 0642   TCO2 26.5 02/28/2016 0642   ACIDBASEDEF 2.0 02/13/2016 0411   O2SAT 99.2 02/28/2016 0642   CBG (last 3)   Recent Labs  03/07/16 2052 03/07/16 2321 03/08/16 0401  GLUCAP 145* 137* 147*    Assessment/Plan: S/P Procedure(s) (LRB): ABDOMINAL WOUND CLOSURE (N/A) PERCUTANEOUS TRACHEOSTOMY (N/A)  1. Chest tube- 75 cc output, CXR is stable, moderate right sided effusion remains present with atelectasis 2. Remains sedated on vent 3. Care per trauma   LOS:  28 days    Chinmay Squier, Denny PeonRIN 03/08/2016

## 2016-03-08 NOTE — Progress Notes (Signed)
Pharmacy Antibiotic Note  [name] Bell is a 35 y.o. female admitted on 02/09/2016 with trauma due to suicide attempt. Pharmacy was consulted for Cefepime dosing for HCAP.  Pt has received 4 doses of cefepime so far. After discussion with Dr. Lindie SpruceWyatt regarding microbiology report, cefepime will be narrowed and antibiotics continued for 3 more days.  Plan: -Discontinue cefepime -start levofloxcin 750 mg IV q24h x 3 doses -Monitor renal function and clinical course -Stop date has been entered, pharmacy will sign off. Please re-consult if dosing recommendations needed.  Height: 5\' 11"  (180.3 cm) Weight: 285 lb 4.4 oz (129.4 kg) IBW/kg (Calculated) : 70.8  Temp (24hrs), Avg:99.1 F (37.3 C), Min:98.5 F (36.9 C), Max:99.6 F (37.6 C)   Recent Labs Lab 03/03/16 0650 03/04/16 0654 03/05/16 0631 03/06/16 0550 03/08/16 0410  WBC 13.9* 16.6* 17.9* 18.6* 24.3*  CREATININE 1.97* 1.84* 1.68* 1.39* 1.14*    Estimated Creatinine Clearance: 105.4 mL/min (by C-G formula based on Cr of 1.14).    No Known Allergies  Antimicrobials this admission: Cefepime 4/29 >> 5/1 Levofloxacin 5/1 >> (last dose 5/3)  Microbiology this admission: 4/26 TA > enterobacter cloacae (S- cefepime, Cipro, aminoglycosides, Bactrim)  Thank you for allowing pharmacy to participate in this patient's care.   Arcola JanskyMeagan Finbar Nippert, PharmD Clinical Pharmacy Resident Pager: [phone]  03/08/2016 9:18 AM

## 2016-03-09 ENCOUNTER — Inpatient Hospital Stay (HOSPITAL_COMMUNITY): Payer: Medicaid Other

## 2016-03-09 LAB — COMPREHENSIVE METABOLIC PANEL
ALT: 41 U/L (ref 14–54)
AST: 71 U/L — AB (ref 15–41)
Albumin: 1.2 g/dL — ABNORMAL LOW (ref 3.5–5.0)
Alkaline Phosphatase: 107 U/L (ref 38–126)
Anion gap: 9 (ref 5–15)
BUN: 85 mg/dL — AB (ref 6–20)
CHLORIDE: 108 mmol/L (ref 101–111)
CO2: 24 mmol/L (ref 22–32)
Calcium: 8.1 mg/dL — ABNORMAL LOW (ref 8.9–10.3)
Creatinine, Ser: 1.01 mg/dL — ABNORMAL HIGH (ref 0.44–1.00)
Glucose, Bld: 142 mg/dL — ABNORMAL HIGH (ref 65–99)
POTASSIUM: 3.5 mmol/L (ref 3.5–5.1)
Sodium: 141 mmol/L (ref 135–145)
Total Bilirubin: 5.1 mg/dL — ABNORMAL HIGH (ref 0.3–1.2)
Total Protein: 5.6 g/dL — ABNORMAL LOW (ref 6.5–8.1)

## 2016-03-09 LAB — GLUCOSE, CAPILLARY
GLUCOSE-CAPILLARY: 134 mg/dL — AB (ref 65–99)
GLUCOSE-CAPILLARY: 135 mg/dL — AB (ref 65–99)
GLUCOSE-CAPILLARY: 144 mg/dL — AB (ref 65–99)
GLUCOSE-CAPILLARY: 106 mg/dL — AB (ref 65–99)
Glucose-Capillary: 143 mg/dL — ABNORMAL HIGH (ref 65–99)
Glucose-Capillary: 111 mg/dL — ABNORMAL HIGH (ref 65–99)
Glucose-Capillary: 150 mg/dL — ABNORMAL HIGH (ref 65–99)

## 2016-03-09 LAB — CBC WITH DIFFERENTIAL/PLATELET
BASOS ABS: 0.2 10*3/uL — AB (ref 0.0–0.1)
BASOS PCT: 1 %
EOS ABS: 1.2 10*3/uL — AB (ref 0.0–0.7)
Eosinophils Relative: 5 %
HCT: 22.6 % — ABNORMAL LOW (ref 36.0–46.0)
Hemoglobin: 7.1 g/dL — ABNORMAL LOW (ref 12.0–15.0)
Lymphocytes Relative: 4 %
Lymphs Abs: 0.9 10*3/uL (ref 0.7–4.0)
MCH: 29.7 pg (ref 26.0–34.0)
MCHC: 30.1 g/dL (ref 30.0–36.0)
MCV: 98.7 fL (ref 78.0–100.0)
MONO ABS: 2.3 10*3/uL — AB (ref 0.1–1.0)
Monocytes Relative: 10 %
NEUTROS PCT: 80 %
Neutro Abs: 18.8 10*3/uL — ABNORMAL HIGH (ref 1.7–7.7)
PLATELETS: 351 10*3/uL (ref 150–400)
RBC: 2.29 MIL/uL — ABNORMAL LOW (ref 3.87–5.11)
RDW: 24 % — AB (ref 11.5–15.5)
WBC: 23.4 10*3/uL — AB (ref 4.0–10.5)

## 2016-03-09 MED ORDER — CLONAZEPAM 1 MG PO TABS
1.0000 mg | ORAL_TABLET | Freq: Two times a day (BID) | ORAL | Status: DC
  Administered 2016-03-09 – 2016-03-20 (×23): 1 mg
  Filled 2016-03-09 (×9): qty 1
  Filled 2016-03-09: qty 2
  Filled 2016-03-09 (×13): qty 1

## 2016-03-09 MED ORDER — QUETIAPINE FUMARATE 100 MG PO TABS
50.0000 mg | ORAL_TABLET | Freq: Three times a day (TID) | ORAL | Status: DC
  Administered 2016-03-09 – 2016-03-21 (×38): 50 mg
  Filled 2016-03-09 (×38): qty 1

## 2016-03-09 MED ORDER — CLONAZEPAM 0.5 MG PO TBDP: 1.0000 mg | ORAL_TABLET | Freq: Two times a day (BID) | ORAL | Status: DC

## 2016-03-09 NOTE — Progress Notes (Signed)
Occupational Therapy Evaluation Patient Details Name: [name] Bell MRN: [id number] DOB: April 23, 1981 Today's Date: 03/09/2016    History of Present Illness [name] Bell is a 35 y.o. female.She is obese. PMH unknown.Pt attempted suicide: taped DNR note to chest, drank Drano, then jumped off 5 story building ([address] [place]).S/p 02/09/16 gel foam embolization of bil internal iliac arteries.S/p 02/09/16 ex lap with cholecystectomy, resection SB and mesenteric repair, liver lac repair, application or wound vac.S/p 02/09/16 closed reduction of pelvic ring, external pelvic fixation.Also has right metacarpal fracture in splint. Pt intubated via trach.   Clinical Impression   Pt session very limited by pain, body habitus with external fixation at hips and NWB x3 extremities. Pt tolerating EOB < 5 minutes this session with x4 staff members (A)ing patient. Question if PROM allowed on L UE - MD please clarify.     Follow Up Recommendations  LTACH;Supervision/Assistance - 24 hour    Equipment Recommendations  Other (comment) (defer to [facility] and reassess if denied)    Recommendations for Other Services Other (comment) (LTACH/ palliative medicine)     Precautions / Restrictions Precautions Precautions: Sternal;Other (comment) Precaution Comments: pelvic ex fix, multiple abdominal wounds with drains, wound vac, trach Required Braces or Orthoses: Other Brace/Splint Other Brace/Splint: L wrist in splint Restrictions LUE Weight Bearing: Non weight bearing RLE Weight Bearing: Non weight bearing LLE Weight Bearing: Non weight bearing      Mobility Bed Mobility Overal bed mobility: + 2 for safety/equipment;+2 for physical assistance;Needs Assistance Bed Mobility: Supine to Sit;Sit to Supine     Supine to sit: Total assist;+2 for physical assistance;+2 for safety/equipment (+4) Sit to supine: Total assist;+2 for physical assistance;+2 for safety/equipment (+4)   General bed mobility  comments: pt helicopter sat EOB with no participation. total +4 (A) and grimacing in pain. pt unable to tolerate EOB for > 5 minutes. pt coughing off vent multiple times with RN present to help stablize trach vent. Pt dependent on staff for all aspects  Transfers                 General transfer comment: NWB bil LE. Pt will require hoyer lift. MD clarifed via phone call at 15:26 Pm no hip restrictions at this time and body habitus only limiting factor    Balance Overall balance assessment: Needs assistance Sitting-balance support: No upper extremity supported;Feet supported Sitting balance-[name]: Zero                                      ADL Overall ADL's : Needs assistance/impaired                                       General ADL Comments: total (A) for all adls. no participation at this time     Vision Additional Comments: difficult to assess. not following commands.    Perception     Praxis      Pertinent Vitals/Pain Pain Assessment: Faces Faces Pain Scale: Hurts worst Pain Location: unsure facial grimance immediately with movement Pain Descriptors / Indicators: Grimacing Pain Intervention(s): Monitored during session;Limited activity within patient's tolerance;Repositioned;Premedicated before session     Hand Dominance  (unknown)   Extremity/Trunk Assessment Upper Extremity Assessment Upper Extremity Assessment: RUE deficits/detail;LUE deficits/detail RUE Deficits / Details: no movement noted this session LUE Deficits /  Details: splint doff and skin assessed. pt with closed incision and dressing over wound. splint reapplied at this time   Lower Extremity Assessment Lower Extremity Assessment: Defer to PT evaluation   Cervical / Trunk Assessment Cervical / Trunk Assessment: Other exceptions Cervical / Trunk Exceptions: 6 pins placed to help with closing abdomen wound vac present    Communication  Communication Communication: Tracheostomy;Other (comment)   Cognition Arousal/Alertness: Lethargic Behavior During Therapy: Flat affect Overall Cognitive Status: Impaired/Different from baseline                 General Comments: not following commands. pt with eyes open only in sitting. pt had been respositioned and peri hygiene prior to OT session. Question fatigue as factor   General Comments       Exercises       Shoulder Instructions      Home Living Family/patient expects to be discharged to:: Unsure                                 Additional Comments: pt non-verbal unable to report where she lives, per chart pt recently located to [place] 3 weeks ago      Prior Functioning/Environment Level of Independence: Independent             OT Diagnosis: Generalized weakness;Cognitive deficits;Acute pain   OT Problem List: Decreased strength;Decreased activity tolerance;Impaired balance (sitting and/or standing);Decreased safety awareness;Decreased knowledge of use of DME or AE;Decreased knowledge of precautions;Cardiopulmonary status limiting activity;Obesity;Pain;Impaired UE functional use;Increased edema;Decreased cognition;Decreased range of motion   OT Treatment/Interventions: Self-care/ADL training;Therapeutic exercise;DME and/or AE instruction;Therapeutic activities;Patient/family education;Balance training;Splinting    OT Goals(Current goals can be found in the care plan section) Acute Rehab OT Goals Patient Stated Goal: unknown OT Goal Formulation: Patient unable to participate in goal setting Time For Goal Achievement: 03/23/16 Potential to Achieve Goals: Fair  OT Frequency: Min 1X/week (1 x per week for 2 weeks pending removal of hip pins)   Barriers to D/C: Decreased caregiver support  unknown dc        Co-evaluation              End of Session Nurse Communication: Mobility status;Need for lift equipment;Precautions;Weight bearing  status  Activity Tolerance: Patient limited by pain Patient left: in bed;with call bell/phone within reach;with nursing/sitter in room;with SCD's reapplied   Time: [id number] OT Time Calculation (min): 21 min Charges:  OT General Charges $OT Visit: 1 Procedure OT Evaluation $OT Eval High Complexity: 1 Procedure G-Codes:    Harolyn RutherfordJones, Aitanna Haubner B 03/09/2016, 3:30 PM  [name], [name]   OTR/L Pager: [phone] Office: [phone] .

## 2016-03-09 NOTE — Progress Notes (Signed)
OT NOTE  Paged Dr handy regarding mobilization by therapy. Per [name] : no ROM restrictions for the patients hips. Pt is okay to hoyer lift to chair.   Therapy will have to transfer patient with close monitoring of surgical pin sites for x2 weeks until removal .    [name], [name]   OTR/L Pager: [phone] Office: [phone] .

## 2016-03-09 NOTE — Progress Notes (Signed)
Follow up - Trauma and Critical Care  Patient Details:    [name] Bell is an 35 y.o. female.  Lines/tubes : PICC Double Lumen 03/02/16 PICC Right Cephalic 50 cm 0 cm (Active)  Indication for Insertion or Continuance of Line Prolonged intravenous therapies 03/08/2016  8:00 PM  Exposed Catheter (cm) 0 cm 03/01/2016  8:00 AM  Site Assessment Clean;Dry;Intact 03/08/2016  8:00 PM  Lumen #1 Status Infusing 03/08/2016  8:00 PM  Lumen #2 Status Blood return noted;Flushed;Saline locked 03/08/2016  8:00 PM  Dressing Type Transparent 03/08/2016  8:00 PM  Dressing Status Clean;Dry;Intact;Antimicrobial disc in place 03/08/2016  8:00 PM  Line Care Connections checked and tightened 03/08/2016  8:00 PM  Dressing Change Due 03/09/16 03/08/2016  8:00 PM     Chest Tube 1 Right Pleural 36 Fr. (Active)  Suction -20 cm H2O 03/08/2016  8:00 PM  Chest Tube Air Leak None 03/08/2016  8:00 PM  Patency Intervention Tip/tilt 03/08/2016  8:00 PM  Drainage Description Dark red 03/08/2016  8:00 PM  Dressing Status Clean;Dry;Intact 03/08/2016  8:00 PM  Dressing Intervention Other (Comment) 03/07/2016  8:00 PM  Site Assessment Intact 03/07/2016  8:00 PM  Surrounding Skin Unable to view 03/08/2016  8:00 PM  Output (mL) 20 mL 03/09/2016  6:00 AM     Negative Pressure Wound Therapy Abdomen Mid (Active)  Last dressing change 03/08/16 03/08/2016  8:00 PM  Site / Wound Assessment Clean;Dry 03/08/2016  8:00 PM  Peri-wound Assessment Intact 03/08/2016  8:00 PM  Size 33 cm 03/03/2016  4:00 PM  Wound filler - Black foam 1 03/07/2016  8:00 AM  Cycle Continuous 03/08/2016  8:00 PM  Target Pressure (mmHg) 125 03/08/2016  8:00 PM  Canister Changed No 03/07/2016  8:00 PM  Dressing Status Intact 03/08/2016  8:00 PM  Drainage Amount Scant 03/08/2016  8:00 PM  Drainage Description Serosanguineous 03/08/2016  8:00 PM  Output (mL) 40 mL 03/09/2016  6:00 AM     Gastrostomy/Enterostomy Gastrostomy 24 Fr. LUQ (Active)  Surrounding Skin Intact 03/08/2016  8:00 PM  Tube Status  Patent 03/08/2016  8:00 PM  Drainage Appearance Purulent;Yellow 03/08/2016  8:00 AM  Dressing Status Clean;Dry;Intact 03/08/2016  8:00 PM  Dressing Intervention Dressing changed 03/08/2016  8:00 AM  Dressing Type Split gauze 03/08/2016  8:00 PM  G Port Intake (mL) 30 ml 03/06/2016  6:00 PM  Output (mL) 100 mL 02/18/2016  6:20 PM     Urethral Catheter S. Bowman,RN Latex 16 Fr. (Active)  Indication for Insertion or Continuance of Catheter Peri-operative use for selective surgical procedure 03/08/2016  8:00 PM  Site Assessment Intact;Swelling 03/08/2016  8:00 PM  Catheter Maintenance Bag below level of bladder;Catheter secured;Drainage bag/tubing not touching floor;Insertion date on drainage bag;No dependent loops;Seal intact 03/08/2016  8:00 PM  Collection Container Standard drainage bag 03/08/2016  8:00 PM  Securement Method Securing device (Describe) 03/08/2016  8:00 PM  Urinary Catheter Interventions Unclamped 03/08/2016  8:00 PM  Output (mL) 350 mL 03/09/2016  8:00 AM    Microbiology/Sepsis markers: Results for orders placed or performed during the hospital encounter of 02/09/16  MRSA PCR Screening     Status: None   Collection Time: 02/11/16 11:30 AM  Result Value Ref Range Status   MRSA by PCR NEGATIVE NEGATIVE Final    Comment:        The GeneXpert MRSA Assay (FDA approved for NASAL specimens only), is one component of a comprehensive MRSA colonization surveillance program. It is not intended to  diagnose MRSA infection nor to guide or monitor treatment for MRSA infections.   Anaerobic culture     Status: None   Collection Time: 02/26/16  1:19 PM  Result Value Ref Range Status   Specimen Description FLUID PLEURAL RIGHT  Final   Special Requests POF ANCEF  Final   Gram Stain   Final    DEGENERATED CELLULAR MATERIAL PRESENT NO ORGANISMS SEEN    Culture NO ANAEROBES ISOLATED  Final   Report Status 03/02/2016 FINAL  Final  Body fluid culture     Status: None   Collection Time: 02/26/16  1:19 PM   Result Value Ref Range Status   Specimen Description FLUID RIGHT PLEURAL  Final   Special Requests POF ANCEF  Final   Gram Stain   Final    DEGENERATED CELLULAR MATERIAL PRESENT NO ORGANISMS SEEN    Culture   Final    MULTIPLE ORGANISMS PRESENT, NONE PREDOMINANT CRITICAL RESULT CALLED TO, READ BACK BY AND VERIFIED WITH: J PINEDA 02/27/16 @ 1314 M VESTAL    Report Status 02/28/2016 FINAL  Final  Culture, respiratory (NON-Expectorated)     Status: None   Collection Time: 03/03/16  1:17 PM  Result Value Ref Range Status   Specimen Description TRACHEAL ASPIRATE  Final   Special Requests Normal  Final   Gram Stain   Final    FEW WBC PRESENT,BOTH PMN AND MONONUCLEAR NO SQUAMOUS EPITHELIAL CELLS SEEN NO ORGANISMS SEEN Performed at [facility]    Culture   Final    FEW ENTEROBACTER CLOACAE Performed at [facility]    Report Status 03/06/2016 FINAL  Final   Organism ID, Bacteria ENTEROBACTER CLOACAE  Final      Susceptibility   Enterobacter cloacae - MIC*    CEFAZOLIN >=64 RESISTANT Resistant     CEFEPIME <=1 SENSITIVE Sensitive     CEFTAZIDIME >=64 RESISTANT Resistant     CEFTRIAXONE >=64 RESISTANT Resistant     CIPROFLOXACIN <=0.25 SENSITIVE Sensitive     GENTAMICIN <=1 SENSITIVE Sensitive     IMIPENEM <=0.25 SENSITIVE Sensitive     PIP/TAZO >=128 RESISTANT Resistant     TOBRAMYCIN <=1 SENSITIVE Sensitive     TRIMETH/SULFA Value in next row Sensitive      <=20 SENSITIVE(NOTE)    * FEW ENTEROBACTER CLOACAE    Anti-infectives:  Anti-infectives    Start     Dose/Rate Route Frequency Ordered Stop   03/08/16 1400  levofloxacin (LEVAQUIN) IVPB 750 mg     750 mg 100 mL/hr over 90 Minutes Intravenous Every 24 hours 03/08/16 0912 03/11/16 1359   03/06/16 1400  ceFEPIme (MAXIPIME) 2 g in dextrose 5 % 50 mL IVPB  Status:  Discontinued     2 g 100 mL/hr over 30 Minutes Intravenous Every 12 hours 03/06/16 1344 03/08/16 0912   03/01/16 1330  ceFAZolin (ANCEF) IVPB  2g/100 mL premix     2 g 200 mL/hr over 30 Minutes Intravenous To [facility] 02/29/16 0917 03/01/16 1452   02/11/16 0600  piperacillin-tazobactam (ZOSYN) IVPB 3.375 g  Status:  Discontinued     3.375 g 100 mL/hr over 30 Minutes Intravenous 4 times per day 02/10/16 2214 02/22/16 0853   02/10/16 1730  piperacillin-tazobactam (ZOSYN) IVPB 3.375 g  Status:  Discontinued     3.375 g 12.5 mL/hr over 240 Minutes Intravenous 3 times per day 02/10/16 1727 02/10/16 2214      Best Practice/Protocols:  VTE Prophylaxis: Lovenox (prophylaxtic dose) and Mechanical GI  Prophylaxis: Proton Pump Inhibitor Continous Sedation  Consults: Treatment Team:  Myrene Galas, MD Kerin Perna, MD    Events:  Subjective:    Overnight Issues: Awakening a bit more today than yesterday.  Following commands.  Smiled at me today.  Objective:  Vital signs for last 24 hours: Temp:  [97.8 F (36.6 C)-99.1 F (37.3 C)] 97.8 F (36.6 C) (05/02 0800) Pulse Rate:  [82-95] 87 (05/02 0804) Resp:  [20-28] 28 (05/02 0804) BP: (100-121)/(52-70) 106/60 mmHg (05/02 0804) SpO2:  [98 %-100 %] 100 % (05/02 0804) FiO2 (%):  [30 %] 30 % (05/02 0804) Weight:  [131.5 kg (289 lb 14.5 oz)] 131.5 kg (289 lb 14.5 oz) (05/02 0500)  Hemodynamic parameters for last 24 hours:    Intake/Output from previous day: 05/01 0701 - 05/02 0700 In: 5060 [I.V.:3050; NG/GT:1860; IV Piggyback:150] Out: 3660 [Urine:3500; Drains:90; Chest Tube:70]  Intake/Output this shift: Total I/O In: -  Out: 350 [Urine:350]  Vent settings for last 24 hours: Vent Mode:  [-] PSV;CPAP FiO2 (%):  [30 %] 30 % Set Rate:  [20 bmp] 20 bmp Vt Set:  [490 mL] 490 mL PEEP:  [5 cmH20] 5 cmH20 Pressure Support:  [15 cmH20-20 cmH20] 15 cmH20 Plateau Pressure:  [21 cmH20-27 cmH20] 22 cmH20  Physical Exam:  General: no respiratory distress and awakens and follows commands Neuro: oriented, nonfocal exam, RASS 0 and global weakness, but moves all  fours HEENT/Neck: trach-clean, intact and Still has some drainage in the lower part of the trach incision. Resp: diminished breath sounds bibasilar CVS: regular rate and rhythm, S1, S2 normal, no murmur, click, rub or gallop and occasional sinue tachycardia GI: Open wound with NPWT, subQ, minimal drainage.  great granulation tissue Extremities: edema 2+ and edema 3+  Results for orders placed or performed during the hospital encounter of 02/09/16 (from the past 24 hour(s))  Glucose, capillary     Status: Abnormal   Collection Time: 03/08/16 12:18 PM  Result Value Ref Range   Glucose-Capillary 147 (H) 65 - 99 mg/dL   Comment 1 Notify RN    Comment 2 Document in Chart   Glucose, capillary     Status: Abnormal   Collection Time: 03/08/16  3:41 PM  Result Value Ref Range   Glucose-Capillary 132 (H) 65 - 99 mg/dL   Comment 1 Notify RN    Comment 2 Document in Chart   Glucose, capillary     Status: Abnormal   Collection Time: 03/08/16  7:51 PM  Result Value Ref Range   Glucose-Capillary 129 (H) 65 - 99 mg/dL  Glucose, capillary     Status: Abnormal   Collection Time: 03/08/16 11:27 PM  Result Value Ref Range   Glucose-Capillary 134 (H) 65 - 99 mg/dL  Glucose, capillary     Status: Abnormal   Collection Time: 03/09/16  3:37 AM  Result Value Ref Range   Glucose-Capillary 135 (H) 65 - 99 mg/dL  CBC with Differential/Platelet     Status: Abnormal   Collection Time: 03/09/16  5:30 AM  Result Value Ref Range   WBC 23.4 (H) 4.0 - 10.5 K/uL   RBC 2.29 (L) 3.87 - 5.11 MIL/uL   Hemoglobin 7.1 (L) 12.0 - 15.0 g/dL   HCT 86.5 (L) 78.4 - 69.6 %   MCV 98.7 78.0 - 100.0 fL   MCH 29.7 26.0 - 34.0 pg   MCHC 30.1 30.0 - 36.0 g/dL   RDW 29.5 (H) 28.4 - 13.2 %   Platelets 351  150 - 400 K/uL   Neutrophils Relative % 80 %   Lymphocytes Relative 4 %   Monocytes Relative 10 %   Eosinophils Relative 5 %   Basophils Relative 1 %   Neutro Abs 18.8 (H) 1.7 - 7.7 K/uL   Lymphs Abs 0.9 0.7 - 4.0 K/uL    Monocytes Absolute 2.3 (H) 0.1 - 1.0 K/uL   Eosinophils Absolute 1.2 (H) 0.0 - 0.7 K/uL   Basophils Absolute 0.2 (H) 0.0 - 0.1 K/uL   RBC Morphology POLYCHROMASIA PRESENT    WBC Morphology TOXIC GRANULATION   Comprehensive metabolic panel     Status: Abnormal   Collection Time: 03/09/16  5:30 AM  Result Value Ref Range   Sodium 141 135 - 145 mmol/L   Potassium 3.5 3.5 - 5.1 mmol/L   Chloride 108 101 - 111 mmol/L   CO2 24 22 - 32 mmol/L   Glucose, Bld 142 (H) 65 - 99 mg/dL   BUN 85 (H) 6 - 20 mg/dL   Creatinine, Ser 1.611.01 (H) 0.44 - 1.00 mg/dL   Calcium 8.1 (L) 8.9 - 10.3 mg/dL   Total Protein 5.6 (L) 6.5 - 8.1 g/dL   Albumin 1.2 (L) 3.5 - 5.0 g/dL   AST 71 (H) 15 - 41 U/L   ALT 41 14 - 54 U/L   Alkaline Phosphatase 107 38 - 126 U/L   Total Bilirubin 5.1 (H) 0.3 - 1.2 mg/dL   GFR calc non Af Amer >60 >60 mL/min   GFR calc Af Amer >60 >60 mL/min   Anion gap 9 5 - 15  Glucose, capillary     Status: Abnormal   Collection Time: 03/09/16  8:15 AM  Result Value Ref Range   Glucose-Capillary 143 (H) 65 - 99 mg/dL     Assessment/Plan:   NEURO  Altered Mental Status:  delirium, pain and sedation   Plan: Will reduce her sedatives and see if she will become more interactive  PULM  Atelectasis/collapse (focal) Hemothorax (retained right hemothorax)   Plan: No further intervention planned for this time.  CARDIO  Sinus Tachycardia   Plan: No specific treatment  RENAL  Actue Renal Failure (acute tubular necrosis and etiology unknown) improving and BUN less than 100.  Creatinine 1.0   Plan: Continue to monitor for improvement.  No dialysis necessary  GI  Blunt Abdominal Trauma, Bowel Trauma with enterectomy and Hepatic Trauma   Plan: CPM, tolerating tube feedings.  ID  Pneumonia (hospital acquired (not ventilator-associated) Enterobacter pneumonia)   Plan: CPM  HEME  Anemia acute blood loss anemia and anemia of critical illness)   Plan: No blood for now.  ENDO No lingering  issues   Plan: CPM  Global Issues  Patient is slowly improving.  Starting to wean on the ventilator a bit.  Responding more appropriately.  Abdomen is improving. Tbili is down to 5.1 and transaminases are much better.    LOS: 29 days   Additional comments:I reviewed the patient's new clinical lab test results. cbc/bmet and I reviewed the patients new imaging test results. cxr  Critical Care Total Time*: 30 Minutes  Jarell Mcewen 03/09/2016  *Care during the described time interval was provided by me and/or other providers on the critical care team.  I have reviewed this patient's available data, including medical history, events of note, physical examination and test results as part of my evaluation.

## 2016-03-09 NOTE — Progress Notes (Addendum)
      [address]       [place] [id number]             [phone]      8 Days Post-Op Procedure(s) (LRB): ABDOMINAL WOUND CLOSURE (N/A) PERCUTANEOUS TRACHEOSTOMY (N/A)   Subjective:  Patient remains on vent.  Just received fentanyl, but attempted to open eyes, squeeze hand  Objective: Vital signs in last 24 hours: Temp:  [97.8 F (36.6 C)-99.1 F (37.3 C)] 97.8 F (36.6 C) (05/02 0800) Pulse Rate:  [82-95] 91 (05/02 1005) Cardiac Rhythm:  [-] Normal sinus rhythm (05/01 2000) Resp:  [20-37] 37 (05/02 1005) BP: (100-121)/(52-70) 113/62 mmHg (05/02 1005) SpO2:  [98 %-100 %] 100 % (05/02 1005) FiO2 (%):  [30 %] 30 % (05/02 1005) Weight:  [289 lb 14.5 oz (131.5 kg)] 289 lb 14.5 oz (131.5 kg) (05/02 0500)  Intake/Output from previous day: 05/01 0701 - 05/02 0700 In: 5060 [I.V.:3050; NG/GT:1860; IV Piggyback:150] Out: 3660 [Urine:3500; Drains:90; Chest Tube:70] Intake/Output this shift: Total I/O In: -  Out: 350 [Urine:350]  General appearance: on vent Heart: regular rate and rhythm Lungs: diminished breath sounds on left, coarse in upper airway Wound: clean and dry  Lab Results:  Recent Labs  03/08/16 0410 03/09/16 0530  WBC 24.3* 23.4*  HGB 7.2* 7.1*  HCT 23.8* 22.6*  PLT 346 351   BMET:  Recent Labs  03/08/16 0410 03/09/16 0530  NA 146* 141  K 3.7 3.5  CL 111 108  CO2 25 24  GLUCOSE 164* 142*  BUN 101* 85*  CREATININE 1.14* 1.01*  CALCIUM 8.2* 8.1*    PT/INR: No results for input(s): LABPROT, INR in the last 72 hours. ABG    Component Value Date/Time   PHART 7.508* 02/28/2016 0642   HCO3 25.5* 02/28/2016 0642   TCO2 26.5 02/28/2016 0642   ACIDBASEDEF 2.0 02/13/2016 0411   O2SAT 99.2 02/28/2016 0642   CBG (last 3)   Recent Labs  03/08/16 2327 03/09/16 0337 03/09/16 0815  GLUCAP 134* 135* 143*    Assessment/Plan: S/P Procedure(s) (LRB): ABDOMINAL WOUND CLOSURE (N/A) PERCUTANEOUS TRACHEOSTOMY (N/A)  1. Chest  tube- 70 cc output yesterday, no CXR today, however yesterdays film was stable with continued right sided hemothorax 2. Dispo- leave chest tube in place for now.. I spoke briefly with Dr. Lindie SpruceWyatt in regards to treatment plan.  Will discuss further with Dr. Donata ClayVan Trigt.   LOS: 29 days    BARRETT, ERIN 03/09/2016  R chest tube has drained 1.5 liters Will repeat CT chest to assess another CT guided drain patient examined and medical record reviewed,agree with above note. Kathlee Nationseter Van Trigt III 03/09/2016

## 2016-03-09 NOTE — Progress Notes (Signed)
Pt. Was transported to CT & back to [facility]08 without any complications.  

## 2016-03-09 NOTE — Progress Notes (Addendum)
Dr Morton PetersVan Tright has ordered CT chest with contrast. Pt has had two bouts of Acute renal failure this hospitalization. Trauma MD paged, he said will defer to ordering MD. CVTS PA Denny PeonErin paged, , she said ask Dr Morton PetersVan Tright. Dr Morton PetersVan Tright was paged [phone]) at 2 PM.  LM through RN Ryan for Dr. Morton PetersVan Tright to contact me after surgery to clarify that benefit of IV contrast outweigh risk in this pt.

## 2016-03-10 ENCOUNTER — Inpatient Hospital Stay (HOSPITAL_COMMUNITY): Payer: Medicaid Other

## 2016-03-10 ENCOUNTER — Encounter (HOSPITAL_COMMUNITY): Payer: Self-pay | Admitting: General Surgery

## 2016-03-10 LAB — BASIC METABOLIC PANEL
ANION GAP: 7 (ref 5–15)
BUN: 70 mg/dL — ABNORMAL HIGH (ref 6–20)
CALCIUM: 8.1 mg/dL — AB (ref 8.9–10.3)
CO2: 26 mmol/L (ref 22–32)
Chloride: 110 mmol/L (ref 101–111)
Creatinine, Ser: 0.94 mg/dL (ref 0.44–1.00)
GLUCOSE: 137 mg/dL — AB (ref 65–99)
Potassium: 3.6 mmol/L (ref 3.5–5.1)
Sodium: 143 mmol/L (ref 135–145)

## 2016-03-10 LAB — CBC WITH DIFFERENTIAL/PLATELET
Basophils Absolute: 0 10*3/uL (ref 0.0–0.1)
Basophils Relative: 0 %
EOS PCT: 5 %
Eosinophils Absolute: 1.2 10*3/uL — ABNORMAL HIGH (ref 0.0–0.7)
HEMATOCRIT: 22.9 % — AB (ref 36.0–46.0)
HEMOGLOBIN: 7 g/dL — AB (ref 12.0–15.0)
LYMPHS ABS: 0.9 10*3/uL (ref 0.7–4.0)
Lymphocytes Relative: 4 %
MCH: 30 pg (ref 26.0–34.0)
MCHC: 30.6 g/dL (ref 30.0–36.0)
MCV: 98.3 fL (ref 78.0–100.0)
MONOS PCT: 8 %
Monocytes Absolute: 1.8 10*3/uL — ABNORMAL HIGH (ref 0.1–1.0)
NEUTROS ABS: 19.1 10*3/uL — AB (ref 1.7–7.7)
Neutrophils Relative %: 83 %
Platelets: 363 10*3/uL (ref 150–400)
RBC: 2.33 MIL/uL — ABNORMAL LOW (ref 3.87–5.11)
RDW: 23.8 % — ABNORMAL HIGH (ref 11.5–15.5)
WBC: 23 10*3/uL — ABNORMAL HIGH (ref 4.0–10.5)

## 2016-03-10 LAB — URINALYSIS, ROUTINE W REFLEX MICROSCOPIC
Glucose, UA: NEGATIVE mg/dL
Hgb urine dipstick: NEGATIVE
Ketones, ur: NEGATIVE mg/dL
Leukocytes, UA: NEGATIVE
NITRITE: NEGATIVE
PROTEIN: NEGATIVE mg/dL
SPECIFIC GRAVITY, URINE: 1.015 (ref 1.005–1.030)
pH: 6 (ref 5.0–8.0)

## 2016-03-10 LAB — PREPARE RBC (CROSSMATCH)

## 2016-03-10 LAB — GLUCOSE, CAPILLARY
GLUCOSE-CAPILLARY: 140 mg/dL — AB (ref 65–99)
Glucose-Capillary: 129 mg/dL — ABNORMAL HIGH (ref 65–99)
Glucose-Capillary: 121 mg/dL — ABNORMAL HIGH (ref 65–99)
Glucose-Capillary: 139 mg/dL — ABNORMAL HIGH (ref 65–99)
Glucose-Capillary: 124 mg/dL — ABNORMAL HIGH (ref 65–99)
Glucose-Capillary: 101 mg/dL — ABNORMAL HIGH (ref 65–99)

## 2016-03-10 MED ORDER — SODIUM CHLORIDE 0.9 % IV SOLN
Freq: Once | INTRAVENOUS | Status: AC
  Administered 2016-03-10: 13:00:00 via INTRAVENOUS

## 2016-03-10 MED ORDER — HYDROCODONE-ACETAMINOPHEN 7.5-325 MG/15ML PO SOLN: 10.0000 mL | Freq: Four times a day (QID) | ORAL | Status: DC | PRN

## 2016-03-10 MED ORDER — SODIUM CHLORIDE 0.9 % IV SOLN
Freq: Once | INTRAVENOUS | Status: AC
  Administered 2016-03-10: 13:00:00
  Filled 2016-03-10: qty 50

## 2016-03-10 MED ORDER — OXYCODONE HCL 5 MG/5ML PO SOLN
10.0000 mg | ORAL | Status: DC | PRN
  Administered 2016-03-10 – 2016-03-20 (×13): 10 mg
  Filled 2016-03-10 (×13): qty 10

## 2016-03-10 MED ORDER — HYDROMORPHONE HCL 1 MG/ML IJ SOLN
1.0000 mg | INTRAMUSCULAR | Status: DC | PRN
  Administered 2016-03-10 – 2016-03-12 (×9): 1 mg via INTRAVENOUS
  Administered 2016-03-13 (×2): 2 mg via INTRAVENOUS
  Administered 2016-03-13 (×2): 1 mg via INTRAVENOUS
  Administered 2016-03-13 – 2016-03-14 (×4): 2 mg via INTRAVENOUS
  Administered 2016-03-14: 1 mg via INTRAVENOUS
  Administered 2016-03-15 (×3): 2 mg via INTRAVENOUS
  Administered 2016-03-15 – 2016-03-16 (×3): 1 mg via INTRAVENOUS
  Administered 2016-03-16 – 2016-03-19 (×12): 2 mg via INTRAVENOUS
  Administered 2016-03-19: 1 mg via INTRAVENOUS
  Administered 2016-03-19: 2 mg via INTRAVENOUS
  Administered 2016-03-19: 1 mg via INTRAVENOUS
  Administered 2016-03-20: 2 mg via INTRAVENOUS
  Administered 2016-03-20 (×2): 1 mg via INTRAVENOUS
  Administered 2016-03-21 (×2): 2 mg via INTRAVENOUS
  Administered 2016-03-21: 1 mg via INTRAVENOUS
  Administered 2016-03-21 – 2016-03-22 (×3): 2 mg via INTRAVENOUS
  Filled 2016-03-10 (×3): qty 1
  Filled 2016-03-10 (×3): qty 2
  Filled 2016-03-10: qty 1
  Filled 2016-03-10 (×4): qty 2
  Filled 2016-03-10 (×5): qty 1
  Filled 2016-03-10: qty 2
  Filled 2016-03-10 (×2): qty 1
  Filled 2016-03-10: qty 2
  Filled 2016-03-10: qty 1
  Filled 2016-03-10 (×4): qty 2
  Filled 2016-03-10: qty 1
  Filled 2016-03-10 (×3): qty 2
  Filled 2016-03-10: qty 1
  Filled 2016-03-10 (×3): qty 2
  Filled 2016-03-10: qty 1
  Filled 2016-03-10 (×4): qty 2
  Filled 2016-03-10: qty 1
  Filled 2016-03-10 (×2): qty 2
  Filled 2016-03-10: qty 1
  Filled 2016-03-10 (×2): qty 2
  Filled 2016-03-10: qty 1
  Filled 2016-03-10 (×3): qty 2
  Filled 2016-03-10: qty 1
  Filled 2016-03-10: qty 2

## 2016-03-10 MED ORDER — CHLORHEXIDINE GLUCONATE 0.12 % MT SOLN
OROMUCOSAL | Status: AC
  Filled 2016-03-10: qty 15

## 2016-03-10 NOTE — Progress Notes (Signed)
Follow up - Trauma and Critical Care  Patient Details:    [name] Bell is an 35 y.o. female.  Lines/tubes : PICC Double Lumen 03/02/16 PICC Right Cephalic 50 cm 0 cm (Active)  Indication for Insertion or Continuance of Line Prolonged intravenous therapies 03/10/2016  3:00 AM  Exposed Catheter (cm) 0 cm 03/01/2016  8:00 AM  Site Assessment Clean;Dry;Intact 03/10/2016  3:00 AM  Lumen #1 Status Infusing 03/10/2016  3:00 AM  Lumen #2 Status Blood return noted;Flushed;Saline locked 03/10/2016  3:00 AM  Dressing Type Transparent 03/10/2016  3:00 AM  Dressing Status Clean;Dry;Intact;Antimicrobial disc in place 03/10/2016  3:00 AM  Line Care Connections checked and tightened 03/10/2016  3:00 AM  Dressing Intervention New dressing;Dressing changed 03/10/2016  3:00 AM  Dressing Change Due 03/17/16 03/10/2016  3:00 AM     Chest Tube 1 Right Pleural 36 Fr. (Active)  Suction -20 cm H2O 03/09/2016  8:00 PM  Chest Tube Air Leak None 03/09/2016  8:00 PM  Patency Intervention Tip/tilt 03/08/2016  8:00 PM  Drainage Description Dark red;Clots 03/09/2016  8:00 PM  Dressing Status Clean;Dry;Intact 03/09/2016  8:00 PM  Dressing Intervention Other (Comment) 03/07/2016  8:00 PM  Site Assessment Intact 03/09/2016  8:00 PM  Surrounding Skin Unable to view 03/08/2016  8:00 PM  Output (mL) 46 mL 03/10/2016  6:00 AM     Negative Pressure Wound Therapy Abdomen Mid (Active)  Last dressing change 03/08/16 03/09/2016  8:00 PM  Site / Wound Assessment Clean;Dry 03/09/2016  8:00 PM  Peri-wound Assessment Intact 03/09/2016  8:00 PM  Size 33 cm 03/03/2016  4:00 PM  Wound filler - Black foam 1 03/09/2016  8:00 PM  Cycle Continuous 03/09/2016  8:00 PM  Target Pressure (mmHg) 125 03/09/2016  8:00 PM  Canister Changed No 03/09/2016  8:00 PM  Dressing Status Intact 03/09/2016  8:00 PM  Drainage Amount Scant 03/09/2016  8:00 PM  Drainage Description Serosanguineous 03/09/2016  8:00 PM  Output (mL) 40 mL 03/10/2016  6:00 AM     Gastrostomy/Enterostomy Gastrostomy 24 Fr.  LUQ (Active)  Surrounding Skin Intact 03/09/2016  8:00 PM  Tube Status Patent 03/09/2016  8:00 PM  Drainage Appearance Purulent;Yellow 03/09/2016  8:00 PM  Dressing Status Clean;Dry;Intact 03/09/2016  8:00 PM  Dressing Intervention New dressing 03/09/2016  8:00 PM  Dressing Type Split gauze 03/09/2016  8:00 PM  G Port Intake (mL) 30 ml 03/06/2016  6:00 PM  Output (mL) 100 mL 02/18/2016  6:20 PM     Urethral Catheter S. Bowman,RN Latex 16 Fr. (Active)  Indication for Insertion or Continuance of Catheter Peri-operative use for selective surgical procedure 03/09/2016  8:00 PM  Site Assessment Intact;Swelling 03/09/2016  8:00 PM  Catheter Maintenance Bag below level of bladder;Catheter secured;No dependent loops;Insertion date on drainage bag;Drainage bag/tubing not touching floor 03/09/2016  8:00 PM  Collection Container Standard drainage bag 03/09/2016  8:00 PM  Securement Method Securing device (Describe) 03/09/2016  8:00 PM  Urinary Catheter Interventions Unclamped 03/09/2016  8:00 PM  Output (mL) 225 mL 03/10/2016  6:00 AM    Microbiology/Sepsis markers: Results for orders placed or performed during the hospital encounter of 02/09/16  MRSA PCR Screening     Status: None   Collection Time: 02/11/16 11:30 AM  Result Value Ref Range Status   MRSA by PCR NEGATIVE NEGATIVE Final    Comment:        The GeneXpert MRSA Assay (FDA approved for NASAL specimens only), is one component of a comprehensive  MRSA colonization surveillance program. It is not intended to diagnose MRSA infection nor to guide or monitor treatment for MRSA infections.   Anaerobic culture     Status: None   Collection Time: 02/26/16  1:19 PM  Result Value Ref Range Status   Specimen Description FLUID PLEURAL RIGHT  Final   Special Requests POF ANCEF  Final   Gram Stain   Final    DEGENERATED CELLULAR MATERIAL PRESENT NO ORGANISMS SEEN    Culture NO ANAEROBES ISOLATED  Final   Report Status 03/02/2016 FINAL  Final  Body fluid culture      Status: None   Collection Time: 02/26/16  1:19 PM  Result Value Ref Range Status   Specimen Description FLUID RIGHT PLEURAL  Final   Special Requests POF ANCEF  Final   Gram Stain   Final    DEGENERATED CELLULAR MATERIAL PRESENT NO ORGANISMS SEEN    Culture   Final    MULTIPLE ORGANISMS PRESENT, NONE PREDOMINANT CRITICAL RESULT CALLED TO, READ BACK BY AND VERIFIED WITH: J PINEDA 02/27/16 @ 1314 M VESTAL    Report Status 02/28/2016 FINAL  Final  Culture, respiratory (NON-Expectorated)     Status: None   Collection Time: 03/03/16  1:17 PM  Result Value Ref Range Status   Specimen Description TRACHEAL ASPIRATE  Final   Special Requests Normal  Final   Gram Stain   Final    FEW WBC PRESENT,BOTH PMN AND MONONUCLEAR NO SQUAMOUS EPITHELIAL CELLS SEEN NO ORGANISMS SEEN Performed at [facility]    Culture   Final    FEW ENTEROBACTER CLOACAE Performed at [facility]    Report Status 03/06/2016 FINAL  Final   Organism ID, Bacteria ENTEROBACTER CLOACAE  Final      Susceptibility   Enterobacter cloacae - MIC*    CEFAZOLIN >=64 RESISTANT Resistant     CEFEPIME <=1 SENSITIVE Sensitive     CEFTAZIDIME >=64 RESISTANT Resistant     CEFTRIAXONE >=64 RESISTANT Resistant     CIPROFLOXACIN <=0.25 SENSITIVE Sensitive     GENTAMICIN <=1 SENSITIVE Sensitive     IMIPENEM <=0.25 SENSITIVE Sensitive     PIP/TAZO >=128 RESISTANT Resistant     TOBRAMYCIN <=1 SENSITIVE Sensitive     TRIMETH/SULFA Value in next row Sensitive      <=20 SENSITIVE(NOTE)    * FEW ENTEROBACTER CLOACAE    Anti-infectives:  Anti-infectives    Start     Dose/Rate Route Frequency Ordered Stop   03/08/16 1400  levofloxacin (LEVAQUIN) IVPB 750 mg     750 mg 100 mL/hr over 90 Minutes Intravenous Every 24 hours 03/08/16 0912 03/11/16 1359   03/06/16 1400  ceFEPIme (MAXIPIME) 2 g in dextrose 5 % 50 mL IVPB  Status:  Discontinued     2 g 100 mL/hr over 30 Minutes Intravenous Every 12 hours 03/06/16 1344  03/08/16 0912   03/01/16 1330  ceFAZolin (ANCEF) IVPB 2g/100 mL premix     2 g 200 mL/hr over 30 Minutes Intravenous To [facility] 02/29/16 0917 03/01/16 1452   02/11/16 0600  piperacillin-tazobactam (ZOSYN) IVPB 3.375 g  Status:  Discontinued     3.375 g 100 mL/hr over 30 Minutes Intravenous 4 times per day 02/10/16 2214 02/22/16 0853   02/10/16 1730  piperacillin-tazobactam (ZOSYN) IVPB 3.375 g  Status:  Discontinued     3.375 g 12.5 mL/hr over 240 Minutes Intravenous 3 times per day 02/10/16 1727 02/10/16 2214      Best Practice/Protocols:  VTE Prophylaxis: Lovenox (prophylaxtic dose) GI Prophylaxis: Proton Pump Inhibitor Intermittent Sedation mostly pain medications  Consults: Treatment Team:  Myrene Galas, MD Kerin Perna, MD    Events:  Subjective:    Overnight Issues: No new problems.  Not able to wean  Objective:  Vital signs for last 24 hours: Temp:  [97.8 F (36.6 C)-98 F (36.7 C)] 97.8 F (36.6 C) (05/03 0400) Pulse Rate:  [81-91] 83 (05/03 0800) Resp:  [21-40] 21 (05/03 0800) BP: (95-113)/(53-76) 98/64 mmHg (05/03 0800) SpO2:  [98 %-100 %] 100 % (05/03 0818) FiO2 (%):  [30 %] 30 % (05/03 0818) Weight:  [103.6 kg (228 lb 6.3 oz)] 103.6 kg (228 lb 6.3 oz) (05/03 0450)  Hemodynamic parameters for last 24 hours:    Intake/Output from previous day: 05/02 0701 - 05/03 0700 In: 3482.5 [I.V.:1932.5; NG/GT:1550] Out: 3206 [Urine:3050; Drains:60; Chest Tube:96]  Intake/Output this shift: Total I/O In: 230 [I.V.:170; NG/GT:60] Out: 1 [Stool:1]  Vent settings for last 24 hours: Vent Mode:  [-] PRVC FiO2 (%):  [30 %] 30 % Set Rate:  [20 bmp] 20 bmp Vt Set:  [490 mL] 490 mL PEEP:  [5 cmH20] 5 cmH20 Plateau Pressure:  [22 cmH20-27 cmH20] 26 cmH20  Physical Exam:  General: alert and no respiratory distress Neuro: alert, oriented and nonfocal exam Resp: rhonchi bilaterally CVS: regular rate and rhythm, S1, S2 normal, no murmur, click, rub  or gallop and Intermittent sinus tachycardia GI: soft, nontender, BS WNL, no r/g, wound clean and excellent granulation tissue with NPWT off. Extremities: edema 2+ and Edema improved.  Results for orders placed or performed during the hospital encounter of 02/09/16 (from the past 24 hour(s))  Glucose, capillary     Status: Abnormal   Collection Time: 03/09/16 11:31 AM  Result Value Ref Range   Glucose-Capillary 144 (H) 65 - 99 mg/dL   Comment 1 Notify RN   Glucose, capillary     Status: Abnormal   Collection Time: 03/09/16  3:39 PM  Result Value Ref Range   Glucose-Capillary 111 (H) 65 - 99 mg/dL   Comment 1 Notify RN    Comment 2 Document in Chart   Glucose, capillary     Status: Abnormal   Collection Time: 03/09/16  7:51 PM  Result Value Ref Range   Glucose-Capillary 106 (H) 65 - 99 mg/dL  Glucose, capillary     Status: Abnormal   Collection Time: 03/09/16 11:17 PM  Result Value Ref Range   Glucose-Capillary 150 (H) 65 - 99 mg/dL  Glucose, capillary     Status: Abnormal   Collection Time: 03/10/16  3:34 AM  Result Value Ref Range   Glucose-Capillary 129 (H) 65 - 99 mg/dL  Basic metabolic panel     Status: Abnormal   Collection Time: 03/10/16  5:00 AM  Result Value Ref Range   Sodium 143 135 - 145 mmol/L   Potassium 3.6 3.5 - 5.1 mmol/L   Chloride 110 101 - 111 mmol/L   CO2 26 22 - 32 mmol/L   Glucose, Bld 137 (H) 65 - 99 mg/dL   BUN 70 (H) 6 - 20 mg/dL   Creatinine, Ser 1.61 0.44 - 1.00 mg/dL   Calcium 8.1 (L) 8.9 - 10.3 mg/dL   GFR calc non Af Amer >60 >60 mL/min   GFR calc Af Amer >60 >60 mL/min   Anion gap 7 5 - 15  CBC with Differential/Platelet     Status: Abnormal   Collection Time: 03/10/16  5:00 AM  Result Value Ref Range   WBC 23.0 (H) 4.0 - 10.5 K/uL   RBC 2.33 (L) 3.87 - 5.11 MIL/uL   Hemoglobin 7.0 (L) 12.0 - 15.0 g/dL   HCT 45.422.9 (L) 09.836.0 - 11.946.0 %   MCV 98.3 78.0 - 100.0 fL   MCH 30.0 26.0 - 34.0 pg   MCHC 30.6 30.0 - 36.0 g/dL   RDW 14.723.8 (H) 82.911.5 -  15.5 %   Platelets 363 150 - 400 K/uL   Neutrophils Relative % 83 %   Lymphocytes Relative 4 %   Monocytes Relative 8 %   Eosinophils Relative 5 %   Basophils Relative 0 %   Neutro Abs 19.1 (H) 1.7 - 7.7 K/uL   Lymphs Abs 0.9 0.7 - 4.0 K/uL   Monocytes Absolute 1.8 (H) 0.1 - 1.0 K/uL   Eosinophils Absolute 1.2 (H) 0.0 - 0.7 K/uL   Basophils Absolute 0.0 0.0 - 0.1 K/uL   RBC Morphology TARGET CELLS    WBC Morphology MILD LEFT SHIFT (1-5% METAS, OCC MYELO, OCC BANDS)   Glucose, capillary     Status: Abnormal   Collection Time: 03/10/16  7:58 AM  Result Value Ref Range   Glucose-Capillary 121 (H) 65 - 99 mg/dL   Comment 1 Document in Chart      Assessment/Plan:   NEURO  Altered Mental Status:  agitation, delirium and sedation   Plan: Will try to work out medications so that she is not so agitated.  Will give ain medication per tube  PULM  Atelectasis/collapse (focal)   Plan: Per CVTS.  They want an IR guided chest tube placed.  CARDIO  Sinus Tachycardia   Plan: No specific treatment  RENAL  Urine output is good and renal function is improving   Plan: No diuretic  GI  Open abdomen with granulating wound going to wet to dry dressing .  Tolerating tube feedings.   Plan: CPM, wet to dry dressings  ID  Pneumonia (hospital acquired (not ventilator-associated) Enterobacter pneumonia, last day of treatment)   Plan: CPM  HEME  Anemia acute blood loss anemia and anemia of critical illness)   Plan: Hemoglobin of 7.0, will transfuse one unit of blood  ENDO No specific problems   Plan: CPM  Global Issues  Slow weaning and may need LTAC placement for chronic weaning.  Have gone away from NPWT and will start wet to dry dressing.  Try to wean.  Pain medication per tube as needed.    LOS: 30 days   Additional comments:I reviewed the patient's new clinical lab test results. cbc/bmet and I reviewed the patients new imaging test results. cxr  Critical Care Total Time*: 45  Minutes  Willliam Pettet 03/10/2016  *Care during the described time interval was provided by me and/or other providers on the critical care team.  I have reviewed this patient's available data, including medical history, events of note, physical examination and test results as part of my evaluation.

## 2016-03-10 NOTE — Progress Notes (Signed)
Per Dr. Donata ClayVan Trigt, I obtained 10 mg TPA in 50 ml saline from pharmacy. I clamped the left chest tube at both insertion site and using blue clamp from pleura VAC. One of two ties was removed and chest tube was disconnected from the pleura VAC. The clamp closest to the chest tube insertion site was removed and the TPA solution was placed in the left chest tube. Unfortunately, there were several clots in the left chest tube that prevented the solution from going through. I contacted Dr. Donata ClayVan Trigt. Ultimately, after using betadine and sterile towels, a 4 Fogarty catheter was placed in the left chest tube and multiple clots were removed. The remaining TPA solution was placed in the left chest tube and passed thorough without difficulty. This was done at 2:04 pm. The nurse was instructed to remove the clamp from the left chest tube at 4:04 pm and place the chest tube to 20 cm of suction. Also, the left chest tube suture was loose. A new suture (2-0 silk as this what was available) was placed on the opposite side to help secure the left chest tube. Vasoline gauze and dry 4 x4's were placed over chest tube site. Patient tolerated the procedure well. We will obtain a CXR in am.

## 2016-03-10 NOTE — Progress Notes (Signed)
Speech Language Pathology Treatment:    Patient Details Name: [name] Bell MRN: [id number] DOB: September 08, 1981 Today's Date: 03/10/2016 Time:  -       ST has been following pt since initial orders 4/26 for PMSV, swallow and cognitive assessments. Initially pt sedated, intubated, trach'd 4/24 and has remained on vent support. Pt's alertness has been improving and per RN pt is awake, following commands and was placed on trach collar trials 15 minutes ago (2 hours trach collar yesterday).  Pt appears appropriate for a [name] speaking valve (in-line or possibly trach collar if she tolerates). SLP will request orders.    Breck CoonsLisa Willis JusticeLitaker M.Ed [employer] Pager [phone]

## 2016-03-10 NOTE — Progress Notes (Signed)
1125 RT placed PT on 40 % ATC- RN aware. HR 91 Sp02 100% RR 22 BP 100/61

## 2016-03-10 NOTE — Progress Notes (Signed)
Failed SBT (will try on ATC once PT is calm)- worked PS up 40 15cm- PT continued to haver increased RR, low Sp02, high agitation. RN aware.

## 2016-03-10 NOTE — Progress Notes (Addendum)
      [address]       [place] [phone]             [phone]      9 Days Post-Op Procedure(s) (LRB): ABDOMINAL WOUND CLOSURE (N/A) PERCUTANEOUS TRACHEOSTOMY (N/A)   Subjective:  Remains on vent.  Squeezed hands, nodded when asked if in pain.  Sister at bedside  Objective: Vital signs in last 24 hours: Temp:  [97.8 F (36.6 C)-98 F (36.7 C)] 97.8 F (36.6 C) (05/03 0400) Pulse Rate:  [81-91] 83 (05/03 0700) Cardiac Rhythm:  [-] Normal sinus rhythm (05/02 2000) Resp:  [21-40] 21 (05/03 0700) BP: (95-113)/(53-76) 104/66 mmHg (05/03 0700) SpO2:  [98 %-100 %] 100 % (05/03 0818) FiO2 (%):  [30 %] 30 % (05/03 0818) Weight:  [228 lb 6.3 oz (103.6 kg)] 228 lb 6.3 oz (103.6 kg) (05/03 0450)  Intake/Output from previous day: 05/02 0701 - 05/03 0700 In: 3482.5 [I.V.:1932.5; NG/GT:1550] Out: 3206 [Urine:3050; Drains:60; Chest Tube:96]  General appearance: on vent, responds to some commands Heart: regular rate and rhythm Lungs: diminished breath sounds on right Wound: clean and dry  Lab Results:  Recent Labs  03/09/16 0530 03/10/16 0500  WBC 23.4* 23.0*  HGB 7.1* 7.0*  HCT 22.6* 22.9*  PLT 351 363   BMET:  Recent Labs  03/09/16 0530 03/10/16 0500  NA 141 143  K 3.5 3.6  CL 108 110  CO2 24 26  GLUCOSE 142* 137*  BUN 85* 70*  CREATININE 1.01* 0.94  CALCIUM 8.1* 8.1*    PT/INR: No results for input(s): LABPROT, INR in the last 72 hours. ABG    Component Value Date/Time   PHART 7.508* 02/28/2016 0642   HCO3 25.5* 02/28/2016 0642   TCO2 26.5 02/28/2016 0642   ACIDBASEDEF 2.0 02/13/2016 0411   O2SAT 99.2 02/28/2016 0642   CBG (last 3)   Recent Labs  03/09/16 1951 03/09/16 2317 03/10/16 0334  GLUCAP 106* 150* 129*    Assessment/Plan: S/P Procedure(s) (LRB): ABDOMINAL WOUND CLOSURE (N/A) PERCUTANEOUS TRACHEOSTOMY (N/A)  1. Chest tube- 96 cc output from chest tube yesterday- leave in place today 2. Dispo- CT scan done  yesterday continues to show moderate right sided pleural effusion, spoke with PVT yesterday and he stated would possibly see if IR could place another chest tube, continue current care for now... He will follow with formal recommendations    LOS: 30 days    BARRETT, [name] Bell 03/10/2016  R chest tube in good position in undrained  pleural hematoma TPA 10 mg in saline injected into hematoma with sterile technique Leave chest tube to suction patient examined and medical record reviewed,agree with above note. Kathlee Nationseter Van Trigt III 03/10/2016

## 2016-03-11 ENCOUNTER — Inpatient Hospital Stay (HOSPITAL_COMMUNITY): Payer: Medicaid Other

## 2016-03-11 LAB — CBC WITH DIFFERENTIAL/PLATELET
BASOS PCT: 0 %
Basophils Absolute: 0 10*3/uL (ref 0.0–0.1)
EOS PCT: 5 %
Eosinophils Absolute: 1.2 10*3/uL — ABNORMAL HIGH (ref 0.0–0.7)
HEMATOCRIT: 24.5 % — AB (ref 36.0–46.0)
HEMOGLOBIN: 7.7 g/dL — AB (ref 12.0–15.0)
LYMPHS PCT: 5 %
Lymphs Abs: 1.2 10*3/uL (ref 0.7–4.0)
MCH: 31.4 pg (ref 26.0–34.0)
MCHC: 31.4 g/dL (ref 30.0–36.0)
MCV: 100 fL (ref 78.0–100.0)
Monocytes Absolute: 2.1 10*3/uL — ABNORMAL HIGH (ref 0.1–1.0)
Monocytes Relative: 9 %
NEUTROS ABS: 18.8 10*3/uL — AB (ref 1.7–7.7)
NEUTROS PCT: 81 %
PLATELETS: 341 10*3/uL (ref 150–400)
RBC: 2.45 MIL/uL — ABNORMAL LOW (ref 3.87–5.11)
RDW: 22.3 % — ABNORMAL HIGH (ref 11.5–15.5)
WBC: 23.3 10*3/uL — ABNORMAL HIGH (ref 4.0–10.5)

## 2016-03-11 LAB — COMPREHENSIVE METABOLIC PANEL
ALK PHOS: 115 U/L (ref 38–126)
ALT: 43 U/L (ref 14–54)
AST: 70 U/L — AB (ref 15–41)
Albumin: 1.2 g/dL — ABNORMAL LOW (ref 3.5–5.0)
Anion gap: 11 (ref 5–15)
BUN: 59 mg/dL — AB (ref 6–20)
CALCIUM: 8 mg/dL — AB (ref 8.9–10.3)
CHLORIDE: 106 mmol/L (ref 101–111)
CO2: 25 mmol/L (ref 22–32)
CREATININE: 0.92 mg/dL (ref 0.44–1.00)
GFR calc Af Amer: >60 mL/min (ref >60)
Glucose, Bld: 121 mg/dL — ABNORMAL HIGH (ref 65–99)
Potassium: 3.6 mmol/L (ref 3.5–5.1)
Sodium: 142 mmol/L (ref 135–145)
Total Bilirubin: 4.8 mg/dL — ABNORMAL HIGH (ref 0.3–1.2)
Total Protein: 5.7 g/dL — ABNORMAL LOW (ref 6.5–8.1)

## 2016-03-11 LAB — TYPE AND SCREEN
ABO/RH(D): O POS
ANTIBODY SCREEN: NEGATIVE
Unit division: 0

## 2016-03-11 LAB — GLUCOSE, CAPILLARY
GLUCOSE-CAPILLARY: 144 mg/dL — AB (ref 65–99)
GLUCOSE-CAPILLARY: 147 mg/dL — AB (ref 65–99)
Glucose-Capillary: 119 mg/dL — ABNORMAL HIGH (ref 65–99)
Glucose-Capillary: 121 mg/dL — ABNORMAL HIGH (ref 65–99)
Glucose-Capillary: 118 mg/dL — ABNORMAL HIGH (ref 65–99)
Glucose-Capillary: 143 mg/dL — ABNORMAL HIGH (ref 65–99)

## 2016-03-11 MED ORDER — FREE WATER
400.0000 mL | Freq: Four times a day (QID) | Status: DC
  Administered 2016-03-11 – 2016-03-20 (×31): 400 mL

## 2016-03-11 NOTE — Progress Notes (Signed)
10 Days Post-Op Procedure(s) (LRB): ABDOMINAL WOUND CLOSURE (N/A) PERCUTANEOUS TRACHEOSTOMY (N/A) Subjective: TPA injected into R chest hematoma with minimal output CXR unchanged Will leave chest tube in the hematoma to breakdown and drain VATS would not be recommended due to her obesity and that the healing rib fractures will separate apart  Objective: Vital signs in last 24 hours: Temp:  [97.9 F (36.6 C)-98.4 F (36.9 C)] 98.1 F (36.7 C) (05/04 1158) Pulse Rate:  [61-106] 106 (05/04 1100) Cardiac Rhythm:  [-] Normal sinus rhythm (05/04 0800) Resp:  [0-50] 27 (05/04 1100) BP: (95-133)/(51-78) 116/72 mmHg (05/04 1100) SpO2:  [83 %-100 %] 100 % (05/04 1100) FiO2 (%):  [30 %-40 %] 40 % (05/04 0740) Weight:  [291 lb 3.6 oz (132.1 kg)] 291 lb 3.6 oz (132.1 kg) (05/04 0409)  Hemodynamic parameters for last 24 hours:  now afebrile  Intake/Output from previous day: 05/03 0701 - 05/04 0700 In: 4865.1 [I.V.:1338.4; Blood:326.7; NG/GT:2720; IV Piggyback:300] Out: 3417 [Urine:3375; Stool:2; Chest Tube:40] Intake/Output this shift: Total I/O In: 870 [I.V.:200; Other:150; NG/GT:520] Out: 550 [Urine:550] R chest tube incision clean  Lab Results:  Recent Labs  03/10/16 0500 03/11/16 0415  WBC 23.0* 23.3*  HGB 7.0* 7.7*  HCT 22.9* 24.5*  PLT 363 341   BMET:  Recent Labs  03/10/16 0500 03/11/16 0415  NA 143 142  K 3.6 3.6  CL 110 106  CO2 26 25  GLUCOSE 137* 121*  BUN 70* 59*  CREATININE 0.94 0.92  CALCIUM 8.1* 8.0*    PT/INR: No results for input(s): LABPROT, INR in the last 72 hours. ABG    Component Value Date/Time   PHART 7.508* 02/28/2016 0642   HCO3 25.5* 02/28/2016 0642   TCO2 26.5 02/28/2016 0642   ACIDBASEDEF 2.0 02/13/2016 0411   O2SAT 99.2 02/28/2016 0642   CBG (last 3)   Recent Labs  03/11/16 0310 03/11/16 0804 03/11/16 1149  GLUCAP 119* 121* 118*    Assessment/Plan: S/P Procedure(s) (LRB): ABDOMINAL WOUND CLOSURE (N/A) PERCUTANEOUS  TRACHEOSTOMY (N/A) Leave chest tube   LOS: 31 days    Kathlee Nationseter Van Trigt III 03/11/2016

## 2016-03-11 NOTE — Progress Notes (Signed)
Chest tube canister has fluid in each chamber, consistent with being knocked over.  Attempted to consolidate fluid in an effort to determine an accurate reading.  Unable to return fluid completely.  Marked canister in two spots in order to establish a meaningful baseline.

## 2016-03-11 NOTE — Progress Notes (Signed)
Trauma Service Note  Subjective: Patient is on trach collar and a bit anxious..  Sats are okay.  Objective: Vital signs in last 24 hours: Temp:  [97.9 F (36.6 C)-98.4 F (36.9 C)] 98.4 F (36.9 C) (05/04 0800) Pulse Rate:  [61-101] 78 (05/04 1000) Resp:  [0-50] 22 (05/04 0740) BP: (95-133)/(51-78) 112/78 mmHg (05/04 1000) SpO2:  [83 %-100 %] 95 % (05/04 1000) FiO2 (%):  [30 %-40 %] 40 % (05/04 0740) Weight:  [132.1 kg (291 lb 3.6 oz)] 132.1 kg (291 lb 3.6 oz) (05/04 0409) Last BM Date: 03/10/16  Intake/Output from previous day: 05/03 0701 - 05/04 0700 In: 4865.1 [I.V.:1338.4; Blood:326.7; NG/GT:2720; IV Piggyback:300] Out: 3417 [Urine:3375; Stool:2; Chest Tube:40] Intake/Output this shift: Total I/O In: 760 [I.V.:150; Other:150; NG/GT:460] Out: 550 [Urine:550]  General: Anxious on trach collar.  No other distress.  Lungs: Diminished breath sounds on the right.  CXR not much improved.  Maybe a bit more drainage with TPA done yesterday./  Abd: The same.  Getting wet to dry dressings with sterile saline  Extremities: Much less edematous  Neuro: Seems to be intact  Lab Results: CBC   Recent Labs  03/10/16 0500 03/11/16 0415  WBC 23.0* 23.3*  HGB 7.0* 7.7*  HCT 22.9* 24.5*  PLT 363 341   BMET  Recent Labs  03/10/16 0500 03/11/16 0415  NA 143 142  K 3.6 3.6  CL 110 106  CO2 26 25  GLUCOSE 137* 121*  BUN 70* 59*  CREATININE 0.94 0.92  CALCIUM 8.1* 8.0*   PT/INR No results for input(s): LABPROT, INR in the last 72 hours. ABG No results for input(s): PHART, HCO3 in the last 72 hours.  Invalid input(s): PCO2, PO2  Studies/Results: Ct Chest Wo Contrast  03/09/2016  CLINICAL DATA:  Followup right hemothorax. Status post thoracic trauma. EXAM: CT CHEST WITHOUT CONTRAST TECHNIQUE: Multidetector CT imaging of the chest was performed following the standard protocol without IV contrast. COMPARISON:  02/29/2016 and portable chest obtained yesterday. FINDINGS:  Mediastinum/Lymph Nodes: Tracheostomy tube in satisfactory position. Open anterior wound at the site of tube insertion with associated soft tissue air. No mediastinal blood or enlarged lymph nodes. Right PICC tip in the upper right atrium near the cavoatrial junction. Lungs/Pleura: A right chest tube remains in place. No pneumothorax. Moderate amount of pleural fluid on the right, without significant change. Associated dense consolidation or atelectasis of the right lower lobe. Left lower lobe atelectasis is again demonstrated. 4 mm lingular nodule on image number 31, without significant change. Interval 8 mm subpleural nodule in the lingula on image number 38. Upper abdomen: Cholecystectomy clips. Small umbilical hernia containing edematous fat with removal of the previously seen underlying surgical drains. Musculoskeletal: Multiple displaced right rib fractures are again demonstrated with multiple ribs broken an more than 1 place. The previously noted right scapular fracture is not included. IMPRESSION: 1. No significant change in a moderately large right pleural effusion. 2. No significant change in dense consolidation of the right lower lobe with atelectasis. 3. Stable left lower lobe atelectasis. 4. Stable 4 mm lingular nodule and interval 8 mm subpleural lingular nodule compatible with an interval area of rounded atelectasis. 5. Stable right chest tube and multiple right rib fractures without pneumothorax. Electronically Signed   By: Beckie Salts M.D.   On: 03/09/2016 17:15   Dg Chest Port 1 View  03/11/2016  CLINICAL DATA:  Follow up pneumothorax. EXAM: PORTABLE CHEST 1 VIEW COMPARISON:  03/10/2016 radiographs.  CT 03/09/2016. FINDINGS:  0625 hours. The tracheostomy and right arm PICC appear unchanged. The right chest tube is also unchanged in position with its side hole external to the pleural space. Underlying pleural parenchymal densities in the right hemithorax are stable. There is no pneumothorax. The  left lung is clear. The heart size and mediastinal contours are stable. Several displaced right-sided rib fractures again noted. IMPRESSION: Stable radiographic appearance of the chest with persistent loculated pleural fluid on the right. No recurrent pneumothorax seen. Electronically Signed   By: Carey BullocksWilliam  Veazey M.D.   On: 03/11/2016 08:07   Dg Chest Port 1 View  03/10/2016  CLINICAL DATA:  Follow-up hemo thorax. EXAM: PORTABLE CHEST 1 VIEW COMPARISON:  5/1/ 17 FINDINGS: The tracheostomy tube tip is above the carina. There is a right arm PICC line with tip in the cavoatrial junction. Right-sided chest tube is in place. The side port of the chest tube appears outside the chest cavity. Right pleural fluid collection and decreased aeration a right lung is unchanged from previous exam. No pneumothorax identified. Left lung is clear. Numerous right rib fractures which appear displaced are unchanged from previous exam. IMPRESSION: 1. No change in aeration to the right lung compared with previous exam. Electronically Signed   By: Signa Kellaylor  Stroud M.D.   On: 03/10/2016 07:59    Anti-infectives: Anti-infectives    Start     Dose/Rate Route Frequency Ordered Stop   03/08/16 1400  levofloxacin (LEVAQUIN) IVPB 750 mg     750 mg 100 mL/hr over 90 Minutes Intravenous Every 24 hours 03/08/16 0912 03/10/16 1501   03/06/16 1400  ceFEPIme (MAXIPIME) 2 g in dextrose 5 % 50 mL IVPB  Status:  Discontinued     2 g 100 mL/hr over 30 Minutes Intravenous Every 12 hours 03/06/16 1344 03/08/16 0912   03/01/16 1330  ceFAZolin (ANCEF) IVPB 2g/100 mL premix     2 g 200 mL/hr over 30 Minutes Intravenous To ShortStay Surgical 02/29/16 0917 03/01/16 1452   02/11/16 0600  piperacillin-tazobactam (ZOSYN) IVPB 3.375 g  Status:  Discontinued     3.375 g 100 mL/hr over 30 Minutes Intravenous 4 times per day 02/10/16 2214 02/22/16 0853   02/10/16 1730  piperacillin-tazobactam (ZOSYN) IVPB 3.375 g  Status:  Discontinued     3.375  g 12.5 mL/hr over 240 Minutes Intravenous 3 times per day 02/10/16 1727 02/10/16 2214      Assessment/Plan: s/p Procedure(s): ABDOMINAL WOUND CLOSURE PERCUTANEOUS TRACHEOSTOMY Keep on trach collar as long as possible  No other changes  LOS: 31 days   Marta LamasJames O. Gae BonWyatt, III, MD, FACS [phone] Trauma Surgeon 03/11/2016

## 2016-03-11 NOTE — Progress Notes (Signed)
Physical Therapy Treatment Patient Details Name: [name] BobKatia D Goughnour MRN: [id number] DOB: 05/21/1981 Today's Date: 03/11/2016    History of Present Illness [name] Bell is a 35 y.o. female.She is obese. PMH unknown.Pt attempted suicide: taped DNR note to chest, drank Drano, then jumped off 5 story building ([address]).S/p 02/09/16 gel foam embolization of bil internal iliac arteries.S/p 02/09/16 ex lap with cholecystectomy, resection SB and mesenteric repair, liver lac repair, application or wound vac.S/p 02/09/16 closed reduction of pelvic ring, external pelvic fixation.Also has right metacarpal fracture in splint. Pt intubated via trach.    PT Comments    PT decreased frequency for this pt as we have limited ability to progress to EOB and OOB activities with pelvic external fixator.  When EOB attempted pt unable to assist and body habitus gets significant pressure from pins even in reclined sitting.  We could have staff lift her from bed to chair, but we will encounter similar issues in the chair with pin pressure and forming new skin wounds.  So, for now we are going to come in 2 times per week and help assess cognition, command following and LE strength and motion within limits of body habitus and pins.  Significant time today also spent in positioning pt in the bed to reduce pressure on bil feet.  Once pelvic x-fixation internalized, PT will increase frequency again.    Follow Up Recommendations  CIR;Supervision/Assistance - 24 hour     Equipment Recommendations  Wheelchair (measurements PT);Wheelchair cushion (measurements PT);Hospital bed;Other (comment) (hoyer lift)    Recommendations for Other Services Rehab consult     Precautions / Restrictions Precautions Precautions: Sternal;Other (comment) Precaution Comments: pelvic ex fix, multiple abdominal wounds with drains, wound vac, trach Required Braces or Orthoses: Other Brace/Splint Other Brace/Splint: L wrist in  splint Restrictions LUE Weight Bearing: Non weight bearing RLE Weight Bearing: Non weight bearing LLE Weight Bearing: Non weight bearing    Mobility  Bed Mobility Overal bed mobility: +2 for physical assistance             General bed mobility comments: Two person total assist [facility]  Bed flat and air mattress on max inflate.                                      Cognition Arousal/Alertness: Lethargic Behavior During Therapy: Restless Overall Cognitive Status: Impaired/Different from baseline Area of Impairment: Attention;Following commands;Safety/judgement;Problem solving;Awareness   Current Attention Level: Focused   Following Commands: Follows one step commands consistently Safety/Judgement: Decreased awareness of safety;Decreased awareness of deficits Awareness: Intellectual Problem Solving: Decreased initiation;Difficulty sequencing;Requires verbal cues;Requires tactile cues;Slow processing General Comments: Pt following basic one step commands, sometimes immediately, sometimes with increased time.  She is restless in bed and has her left leg over the side even with all 4 rails up.      Exercises General Exercises - Lower Extremity Ankle Circles/Pumps: PROM;Both;10 reps Heel Slides: PROM;Both;10 reps Hip ABduction/ADduction: PROM;Both;10 reps        Pertinent Vitals/Pain Pain Assessment: Faces Faces Pain Scale: Hurts little more Pain Location: with movement of bil LEs Pain Descriptors / Indicators: Grimacing Pain Intervention(s): Monitored during session;Repositioned           PT Goals (current goals can now be found in the care plan section) Acute Rehab PT Goals Patient Stated Goal: unknown Progress towards PT goals: Not progressing toward goals -  comment (due to x-fixator pins and tissue pressure in sitting)    Frequency  Min 2X/week    PT Plan Frequency needs to be updated       End of Session Equipment Utilized  During Treatment: Oxygen (TC on all day today) Activity Tolerance: Patient tolerated treatment well Patient left: in bed;with call bell/phone within reach;with bed alarm set     Time: 2952-8413 PT Time Calculation (min) (ACUTE ONLY): 17 min  Charges:  $Therapeutic Exercise: 8-22 mins                      Edwardo Wojnarowski B. Donielle Kaigler, PT, DPT [phone]   03/11/2016, 4:50 PM

## 2016-03-11 NOTE — Progress Notes (Signed)
Nutrition Follow-up  DOCUMENTATION CODES:   Obesity unspecified  INTERVENTION:  Continue: Pivot 1.5 @ 30 ml/hr 60 ml Prostat TID Provides: 1680 kcal, 157 grams protein, and 546 ml H2O.   NUTRITION DIAGNOSIS:   Increased nutrient needs related to wound healing, catabolic illness as evidenced by estimated needs. Ongoing  GOAL:   Provide needs based on ASPEN/SCCM guidelines Met  MONITOR:   Skin, I & O's, Vent status, Labs, TF tolerance   ASSESSMENT:   Pt from [place] with no past medical hx admitted after suicide attempt by ingestion of Drano, 2 bottles of acetaminophen, nyquil, and possibly 6 alprazolam pills and jumping from 5th floor balcony of hotel. 4/3 pt is s/p exp lap, hepatorraphy, SBR, cholecystectomy, preperitoneal pelvic packing, ex fix pelvis, R HPTX, Bil pulmonary contusions. Plan for repeat OR 4/5 for another possible SBR. Abdomen remains open.   4/7 s/p exp lap, abd VAC dressing change 4/7 TF started 4/9 Pivot 1.5 reached goal of 30 ml/hr, prostat added 60 ml TID 4/10 repeat OR with washout, abdomen left open with negative pressure wound dressing (VAC), sacral screws 4/12 PEG, ABRA placed 4/14 screw fixation at S1/S2 4/24 abd closed  Pt is trached -> weaning currently. Per RT she weaned yesterday for a few hours. Currently has been off vent for 4 hours, doing ok, slightly tachypnaic: Respiratory rate 22-27 Will continue permitted hypocaloric feeding until pt demonstrates ability to remain off vent > 24 hours.  Labs: CBGs 118-121 Medications reviewed.  Diet Order:  Diet NPO time specified  Skin:  Wound (see comment) (stage II hip, surgical incisions, lacerations)  Last BM:  5/1  Height:   Ht Readings from Last 1 Encounters:  02/09/16 5' 11"  (1.803 m)    Weight:   Wt Readings from Last 1 Encounters:  03/11/16 291 lb 3.6 oz (132.1 kg)    Ideal Body Weight:  70.4 kg  BMI:  Body mass index is 40.64 kg/(m^2).  Estimated Nutritional Needs:    Kcal:  1406-1790  Protein:  >/= 140 grams  Fluid:  > 2 L/day  EDUCATION NEEDS:   No education needs identified at this time  [name] Bell. Antonius Hartlage, MS, RD LDN After Hours/Weekend Pager [phone]

## 2016-03-12 ENCOUNTER — Inpatient Hospital Stay (HOSPITAL_COMMUNITY): Payer: Medicaid Other

## 2016-03-12 LAB — GLUCOSE, CAPILLARY
GLUCOSE-CAPILLARY: 131 mg/dL — AB (ref 65–99)
GLUCOSE-CAPILLARY: 154 mg/dL — AB (ref 65–99)
GLUCOSE-CAPILLARY: 103 mg/dL — AB (ref 65–99)
Glucose-Capillary: 128 mg/dL — ABNORMAL HIGH (ref 65–99)
Glucose-Capillary: 123 mg/dL — ABNORMAL HIGH (ref 65–99)
Glucose-Capillary: 116 mg/dL — ABNORMAL HIGH (ref 65–99)

## 2016-03-12 LAB — BASIC METABOLIC PANEL
ANION GAP: 8 (ref 5–15)
BUN: 66 mg/dL — ABNORMAL HIGH (ref 6–20)
CO2: 27 mmol/L (ref 22–32)
Calcium: 8.3 mg/dL — ABNORMAL LOW (ref 8.9–10.3)
Chloride: 110 mmol/L (ref 101–111)
Creatinine, Ser: 0.8 mg/dL (ref 0.44–1.00)
GFR calc Af Amer: >60 mL/min (ref >60)
GLUCOSE: 150 mg/dL — AB (ref 65–99)
POTASSIUM: 4.2 mmol/L (ref 3.5–5.1)
SODIUM: 145 mmol/L (ref 135–145)

## 2016-03-12 MED ORDER — BISACODYL 10 MG RE SUPP
10.0000 mg | Freq: Every day | RECTAL | Status: DC | PRN
  Administered 2016-04-02 – 2016-04-04 (×2): 10 mg via RECTAL
  Filled 2016-03-12 (×2): qty 1

## 2016-03-12 MED ORDER — ATROPINE SULFATE 1 MG/10ML IJ SOSY
PREFILLED_SYRINGE | INTRAMUSCULAR | Status: AC
  Filled 2016-03-12: qty 10

## 2016-03-12 NOTE — Progress Notes (Signed)
RT called by RN reference to respiratory distress.  On arrival to room respirations noted very shallow with RR 45+.  SpO2 89 on ATC.  Suctioning returned small thick white secretions.  No improvement in status post suctioning.  Patient placed back on ventilator with immediate improvement in SpO2.  RT will continue to monitor.

## 2016-03-12 NOTE — Progress Notes (Signed)
Patient having intermittent periods of bradycardia into the 40s. Non-sustained.   Patient then threw up small amount of tan liquid. Tube feeding immediately turned off and PEG tube connected to tubing for gravity draining.   Page MD. Dr. Dwain SarnaWakefield agreed with the PEG to gravity and ordered BMET.  Will continue to monitor closely.

## 2016-03-12 NOTE — Clinical Social Work Note (Signed)
Clinical Social Worker continuing to follow patient and family for support. Patient with a trach, and was able to successfully wean for over 12 hours yesterday. Patient sister not currently at bedside. Patient intermittently following commands but nodding appropriately. CSW remains available for support at this time.  Macario GoldsJesse Waverley [place], [place] [phone]

## 2016-03-12 NOTE — Progress Notes (Signed)
Follow up - Trauma and Critical Care  Patient Details:    [name] Bell is an 35 y.o. female.  Lines/tubes : PICC Double Lumen 03/02/16 PICC Right Cephalic 50 cm 0 cm (Active)  Indication for Insertion or Continuance of Line Prolonged intravenous therapies 03/12/2016  7:28 AM  Exposed Catheter (cm) 0 cm 03/01/2016  8:00 AM  Site Assessment Clean;Dry;Intact 03/11/2016  8:00 PM  Lumen #1 Status Infusing;Flushed;Blood return noted 03/11/2016  8:00 PM  Lumen #2 Status Flushed;Blood return noted 03/11/2016  8:00 PM  Dressing Type Transparent 03/11/2016  8:00 PM  Dressing Status Clean;Dry;Intact;Antimicrobial disc in place 03/11/2016  8:00 PM  Line Care Connections checked and tightened 03/11/2016  8:00 PM  Dressing Intervention New dressing;Dressing changed 03/10/2016  3:00 AM  Dressing Change Due 03/17/16 03/11/2016  8:00 PM     Chest Tube 1 Right Pleural 36 Fr. (Active)  Suction -20 cm H2O 03/11/2016  8:00 PM  Chest Tube Air Leak None 03/10/2016  7:19 PM  Patency Intervention Tip/tilt 03/11/2016  8:00 PM  Drainage Description Dark red 03/11/2016  8:00 PM  Dressing Status Clean;Dry;Intact 03/11/2016  8:00 PM  Dressing Intervention Other (Comment) 03/10/2016  7:19 PM  Site Assessment Clean;Dry;Intact 03/10/2016  7:19 PM  Surrounding Skin Unable to view 03/10/2016  7:19 PM  Output (mL) 40 mL 03/10/2016  6:00 PM     Gastrostomy/Enterostomy Gastrostomy 24 Fr. LUQ (Active)  Surrounding Skin Intact 03/11/2016  8:00 PM  Tube Status Patent 03/11/2016  8:00 PM  Drainage Appearance Purulent 03/11/2016  5:00 PM  Dressing Status Clean;Dry;Intact 03/11/2016  8:00 PM  Dressing Intervention New dressing 03/11/2016  5:00 PM  Dressing Type Split gauze 03/11/2016  8:00 PM  Dressing Change Due 03/10/16 03/10/2016  8:00 AM  G Port Intake (mL) 150 ml 03/11/2016  9:00 AM  Output (mL) 25 mL 03/11/2016  6:00 PM     Urethral Catheter S. Bowman,RN Latex 16 Fr. (Active)  Indication for Insertion or Continuance of Catheter Peri-operative use for selective  surgical procedure 03/12/2016  7:26 AM  Site Assessment Clean;Intact;Swelling 03/11/2016  8:00 PM  Catheter Maintenance Bag below level of bladder;Catheter secured;Drainage bag/tubing not touching floor;Insertion date on drainage bag;No dependent loops;Seal intact;Bag emptied prior to transport 03/12/2016  7:26 AM  Collection Container Standard drainage bag 03/11/2016  8:00 PM  Securement Method Securing device (Describe) 03/11/2016  8:00 PM  Urinary Catheter Interventions Unclamped 03/11/2016  8:00 PM  Output (mL) 20 mL 03/12/2016  8:00 AM    Microbiology/Sepsis markers: Results for orders placed or performed during the hospital encounter of 02/09/16  MRSA PCR Screening     Status: None   Collection Time: 02/11/16 11:30 AM  Result Value Ref Range Status   MRSA by PCR NEGATIVE NEGATIVE Final    Comment:        The GeneXpert MRSA Assay (FDA approved for NASAL specimens only), is one component of a comprehensive MRSA colonization surveillance program. It is not intended to diagnose MRSA infection nor to guide or monitor treatment for MRSA infections.   Anaerobic culture     Status: None   Collection Time: 02/26/16  1:19 PM  Result Value Ref Range Status   Specimen Description FLUID PLEURAL RIGHT  Final   Special Requests POF ANCEF  Final   Gram Stain   Final    DEGENERATED CELLULAR MATERIAL PRESENT NO ORGANISMS SEEN    Culture NO ANAEROBES ISOLATED  Final   Report Status 03/02/2016 FINAL  Final  Body fluid  culture     Status: None   Collection Time: 02/26/16  1:19 PM  Result Value Ref Range Status   Specimen Description FLUID RIGHT PLEURAL  Final   Special Requests POF ANCEF  Final   Gram Stain   Final    DEGENERATED CELLULAR MATERIAL PRESENT NO ORGANISMS SEEN    Culture   Final    MULTIPLE ORGANISMS PRESENT, NONE PREDOMINANT CRITICAL RESULT CALLED TO, READ BACK BY AND VERIFIED WITH: J PINEDA 02/27/16 @ 1314 M VESTAL    Report Status 02/28/2016 FINAL  Final  Culture, respiratory  (NON-Expectorated)     Status: None   Collection Time: 03/03/16  1:17 PM  Result Value Ref Range Status   Specimen Description TRACHEAL ASPIRATE  Final   Special Requests Normal  Final   Gram Stain   Final    FEW WBC PRESENT,BOTH PMN AND MONONUCLEAR NO SQUAMOUS EPITHELIAL CELLS SEEN NO ORGANISMS SEEN Performed at [facility]    Culture   Final    FEW ENTEROBACTER CLOACAE Performed at [facility]    Report Status 03/06/2016 FINAL  Final   Organism ID, Bacteria ENTEROBACTER CLOACAE  Final      Susceptibility   Enterobacter cloacae - MIC*    CEFAZOLIN >=64 RESISTANT Resistant     CEFEPIME <=1 SENSITIVE Sensitive     CEFTAZIDIME >=64 RESISTANT Resistant     CEFTRIAXONE >=64 RESISTANT Resistant     CIPROFLOXACIN <=0.25 SENSITIVE Sensitive     GENTAMICIN <=1 SENSITIVE Sensitive     IMIPENEM <=0.25 SENSITIVE Sensitive     PIP/TAZO >=128 RESISTANT Resistant     TOBRAMYCIN <=1 SENSITIVE Sensitive     TRIMETH/SULFA Value in next row Sensitive      <=20 SENSITIVE(NOTE)    * FEW ENTEROBACTER CLOACAE    Anti-infectives:  Anti-infectives    Start     Dose/Rate Route Frequency Ordered Stop   03/08/16 1400  levofloxacin (LEVAQUIN) IVPB 750 mg     750 mg 100 mL/hr over 90 Minutes Intravenous Every 24 hours 03/08/16 0912 03/10/16 1501   03/06/16 1400  ceFEPIme (MAXIPIME) 2 g in dextrose 5 % 50 mL IVPB  Status:  Discontinued     2 g 100 mL/hr over 30 Minutes Intravenous Every 12 hours 03/06/16 1344 03/08/16 0912   03/01/16 1330  ceFAZolin (ANCEF) IVPB 2g/100 mL premix     2 g 200 mL/hr over 30 Minutes Intravenous To ShortStay Surgical 02/29/16 0917 03/01/16 1452   02/11/16 0600  piperacillin-tazobactam (ZOSYN) IVPB 3.375 g  Status:  Discontinued     3.375 g 100 mL/hr over 30 Minutes Intravenous 4 times per day 02/10/16 2214 02/22/16 0853   02/10/16 1730  piperacillin-tazobactam (ZOSYN) IVPB 3.375 g  Status:  Discontinued     3.375 g 12.5 mL/hr over 240 Minutes  Intravenous 3 times per day 02/10/16 1727 02/10/16 2214      Best Practice/Protocols:  VTE Prophylaxis: Lovenox (prophylaxtic dose) and Mechanical GI Prophylaxis: Proton Pump Inhibitor Intermittent Sedation  Consults: Treatment Team:  Myrene Galas, MD Kerin Perna, MD    Events:  Subjective:    Overnight Issues: Tolerated trach collar until early this AM, but currently having difficulty coming off the ventilator  Objective:  Vital signs for last 24 hours: Temp:  [97.4 F (36.3 C)-98.7 F (37.1 C)] 98.4 F (36.9 C) (05/05 0721) Pulse Rate:  [64-107] 92 (05/05 0900) Resp:  [6-31] 25 (05/05 0900) BP: (97-129)/(50-82) 110/66 mmHg (05/05 0900) SpO2:  [91 %-  100 %] 100 % (05/05 0900) FiO2 (%):  [40 %] 40 % (05/05 0820) Weight:  [132.6 kg (292 lb 5.3 oz)] 132.6 kg (292 lb 5.3 oz) (05/05 0500)  Hemodynamic parameters for last 24 hours:    Intake/Output from previous day: 05/04 0701 - 05/05 0700 In: 2270 [I.V.:200; NG/GT:1920] Out: 2415 [Urine:2390; Drains:25]  Intake/Output this shift: Total I/O In: 430 [NG/GT:430] Out: 20 [Urine:20]  Vent settings for last 24 hours: Vent Mode:  [-] PRVC FiO2 (%):  [40 %] 40 % Set Rate:  [20 bmp] 20 bmp Vt Set:  [490 mL] 490 mL PEEP:  [5 cmH20] 5 cmH20 Plateau Pressure:  [27 cmH20-33 cmH20] 27 cmH20  Physical Exam:  General: alert, no respiratory distress and but did decompensate with TC this AM Neuro: alert, oriented and nonfocal exam Resp: clear to auscultation bilaterally CVS: regular rate and rhythm, S1, S2 normal, no murmur, click, rub or gallop and intermittent sinus tachycardia GI: soft, nontender, BS WNL, no r/g and tolerating tube feedings well Extremities: edema 1+ and Much improved  Results for orders placed or performed during the hospital encounter of 02/09/16 (from the past 24 hour(s))  Glucose, capillary     Status: Abnormal   Collection Time: 03/11/16 11:49 AM  Result Value Ref Range   Glucose-Capillary  118 (H) 65 - 99 mg/dL  Glucose, capillary     Status: Abnormal   Collection Time: 03/11/16  3:43 PM  Result Value Ref Range   Glucose-Capillary 143 (H) 65 - 99 mg/dL  Glucose, capillary     Status: Abnormal   Collection Time: 03/11/16  7:39 PM  Result Value Ref Range   Glucose-Capillary 144 (H) 65 - 99 mg/dL  Glucose, capillary     Status: Abnormal   Collection Time: 03/11/16 11:27 PM  Result Value Ref Range   Glucose-Capillary 147 (H) 65 - 99 mg/dL  Glucose, capillary     Status: Abnormal   Collection Time: 03/12/16  3:22 AM  Result Value Ref Range   Glucose-Capillary 131 (H) 65 - 99 mg/dL  Glucose, capillary     Status: Abnormal   Collection Time: 03/12/16  7:22 AM  Result Value Ref Range   Glucose-Capillary 128 (H) 65 - 99 mg/dL     Assessment/Plan:   NEURO  Altered Mental Status:  agitation and pain   Plan: Change medications appropriately  PULM  Still has the retained hemothorax on the right, CT in place    Plan: CPM  CARDIO  Sinus Tachycardia   Plan: No specific treatment   RENAL  Urine output is good and renal function has been improving.   Plan: CPM  GI  Open wound is granulating well. no infection.   Plan: Tolerating tube feedings.  Bowel are working  ID  Pulmonary infection has been treated.  No other known infectious sorces   Plan: CPM.  Off antibiotics  HEME  Anemia acute blood loss anemia and anemia of critical illness)   Plan: No blood for now  ENDO No known issues   Plan: CPM  Global Issues  Weaning to trach collar again.    Tracheostomy site is ugly, but will be more aggressive with wound care there.      LOS: 32 days   Additional comments:None  Critical Care Total Time*: 30 Minutes  Taleen Prosser 03/12/2016  *Care during the described time interval was provided by me and/or other providers on the critical care team.  I have reviewed this patient's available data, including  medical history, events of note, physical examination and test results  as part of my evaluation.

## 2016-03-12 NOTE — Progress Notes (Signed)
RN called d/t pt desat after bath.  Sat 88-89% on 40% ATC, increased fio2 to 60%, sat now 93%.  No distress noted, no increased WOB noted compared to earlier today. RN aware.

## 2016-03-12 NOTE — Progress Notes (Signed)
Met with pt's sister, Ulis Rias, at bedside.  Pt alert, smiling at sister.  Pt encouraged by sister's progress.  Provided emotional support to pt/sister.  Sister denies any needs presently and appreciated my visit.     Reinaldo Raddle, RN, BSN  Trauma/Neuro ICU Case Manager [phone]

## 2016-03-12 NOTE — Progress Notes (Addendum)
      [address]       [place] [id number]             [phone]       11 Days Post-Op Procedure(s) (LRB): ABDOMINAL WOUND CLOSURE (N/A) PERCUTANEOUS TRACHEOSTOMY (N/A)  Subjective: She remains on trach collar. She is sleeping this am.  Objective: Vital signs in last 24 hours: Temp:  [97.4 F (36.3 C)-98.7 F (37.1 C)] 98.4 F (36.9 C) (05/05 0721) Pulse Rate:  [64-107] 94 (05/05 0800) Cardiac Rhythm:  [-] Normal sinus rhythm (05/05 0000) Resp:  [6-31] 23 (05/05 0800) BP: (97-129)/(50-82) 111/63 mmHg (05/05 0800) SpO2:  [91 %-100 %] 100 % (05/05 0800) FiO2 (%):  [40 %] 40 % (05/05 0332) Weight:  [292 lb 5.3 oz (132.6 kg)] 292 lb 5.3 oz (132.6 kg) (05/05 0500)     Intake/Output from previous day: 05/04 0701 - 05/05 0700 In: 2270 [I.V.:200; NG/GT:1920] Out: 2415 [Urine:2390; Drains:25]   Physical Exam:  Cardiovascular: RRR Pulmonary: Diminished breath sounds at bases Wounds: Dressing is clean and dry.   Chest Tube: to suction, no air leak, dark bloody drainage  Lab Results: CBC: Recent Labs  03/10/16 0500 03/11/16 0415  WBC 23.0* 23.3*  HGB 7.0* 7.7*  HCT 22.9* 24.5*  PLT 363 341   BMET:  Recent Labs  03/10/16 0500 03/11/16 0415  NA 143 142  K 3.6 3.6  CL 110 106  CO2 26 25  GLUCOSE 137* 121*  BUN 70* 59*  CREATININE 0.94 0.92  CALCIUM 8.1* 8.0*    PT/INR: No results for input(s): LABPROT, INR in the last 72 hours. ABG:  INR: Will add last result for INR, ABG once components are confirmed Will add last 4 CBG results once components are confirmed  Assessment/Plan:  1. CV - SR in the 90's 2.  Pulmonary - Left chest tube with 25 cc of output. TPA put in on Wednesday to help "breakdown" hematoma. CXR this am shows side port of left chest tube in soft tissue of right chest wall, multiple displaced rib fractures, right pleural effusion, and more consolidation on left.Per Dr. Donata ClayVan Trigt, left VATS NOT recommended at this  time.   ZIMMERMAN,DONIELLE MPA-C 03/12/2016,8:37 AM  CXR today slightly improved Cont chest tube to suction patient examined and medical record reviewed,agree with above note. Kathlee Nationseter Van Trigt III 03/12/2016

## 2016-03-12 NOTE — Progress Notes (Signed)
RT placed patient back on ventilator for the night due to increased respiratory rate and bradycardia down to 40s. Patient is resting comfortably on fio2 40% with O2 sat 100% . RT will continue to monitor as needed.

## 2016-03-13 ENCOUNTER — Inpatient Hospital Stay (HOSPITAL_COMMUNITY): Payer: Medicaid Other

## 2016-03-13 LAB — CBC WITH DIFFERENTIAL/PLATELET
BAND NEUTROPHILS: 4 %
EOS PCT: 10 %
HCT: 24.7 % — ABNORMAL LOW (ref 36.0–46.0)
Hemoglobin: 7.4 g/dL — ABNORMAL LOW (ref 12.0–15.0)
Lymphocytes Relative: 8 %
MCH: 30.5 pg (ref 26.0–34.0)
MCHC: 30 g/dL (ref 30.0–36.0)
MCV: 101.6 fL — ABNORMAL HIGH (ref 78.0–100.0)
MONOS PCT: 10 %
NEUTROS PCT: 68 %
Platelets: 304 10*3/uL (ref 150–400)
RBC: 2.43 MIL/uL — AB (ref 3.87–5.11)
RDW: 22.1 % — AB (ref 11.5–15.5)
WBC: 22 10*3/uL — ABNORMAL HIGH (ref 4.0–10.5)

## 2016-03-13 LAB — BASIC METABOLIC PANEL
Anion gap: 8 (ref 5–15)
BUN: 63 mg/dL — ABNORMAL HIGH (ref 6–20)
CALCIUM: 8.4 mg/dL — AB (ref 8.9–10.3)
CO2: 27 mmol/L (ref 22–32)
CREATININE: 0.74 mg/dL (ref 0.44–1.00)
Chloride: 110 mmol/L (ref 101–111)
GFR calc Af Amer: >60 mL/min (ref >60)
GFR calc non Af Amer: >60 mL/min (ref >60)
GLUCOSE: 98 mg/dL (ref 65–99)
Potassium: 3.9 mmol/L (ref 3.5–5.1)
Sodium: 145 mmol/L (ref 135–145)

## 2016-03-13 LAB — GLUCOSE, CAPILLARY
GLUCOSE-CAPILLARY: 92 mg/dL (ref 65–99)
Glucose-Capillary: 102 mg/dL — ABNORMAL HIGH (ref 65–99)
Glucose-Capillary: 112 mg/dL — ABNORMAL HIGH (ref 65–99)
Glucose-Capillary: 100 mg/dL — ABNORMAL HIGH (ref 65–99)
Glucose-Capillary: 98 mg/dL (ref 65–99)
Glucose-Capillary: 96 mg/dL (ref 65–99)

## 2016-03-13 NOTE — Progress Notes (Signed)
Follow up - Trauma and Critical Care  Patient Details:    [name] Bell is an 35 y.o. female.  Lines/tubes : PICC Double Lumen 03/02/16 PICC Right Cephalic 50 cm 0 cm (Active)  Indication for Insertion or Continuance of Line Prolonged intravenous therapies 03/13/2016  8:00 AM  Exposed Catheter (cm) 0 cm 03/01/2016  8:00 AM  Site Assessment Clean;Dry;Intact 03/13/2016  8:00 AM  Lumen #1 Status Infusing 03/13/2016  8:00 AM  Lumen #2 Status Blood return noted;Flushed;Saline locked 03/13/2016  8:00 AM  Dressing Type Transparent 03/13/2016  8:00 AM  Dressing Status Clean;Dry;Intact;Antimicrobial disc in place 03/13/2016  8:00 AM  Line Care Connections checked and tightened 03/13/2016  8:00 AM  Dressing Intervention New dressing;Dressing changed 03/10/2016  3:00 AM  Dressing Change Due 03/17/16 03/13/2016  8:00 AM     Chest Tube 1 Right Pleural 36 Fr. (Active)  Suction -20 cm H2O 03/13/2016  8:00 AM  Chest Tube Air Leak None 03/13/2016  8:00 AM  Patency Intervention Tip/tilt 03/13/2016  8:00 AM  Drainage Description Dark red 03/13/2016  8:00 AM  Dressing Status Clean;Dry;Intact 03/13/2016  8:00 AM  Dressing Intervention Other (Comment) 03/10/2016  7:19 PM  Site Assessment Clean;Dry;Intact 03/10/2016  7:19 PM  Surrounding Skin Unable to view 03/13/2016  8:00 AM  Output (mL) 35 mL 03/13/2016  6:00 AM     Gastrostomy/Enterostomy Gastrostomy 24 Fr. LUQ (Active)  Surrounding Skin Macerated 03/13/2016  8:00 AM  Tube Status Patent 03/13/2016  8:00 AM  Drainage Appearance Purulent 03/13/2016  8:00 AM  Dressing Status Clean;Dry;Intact 03/13/2016  8:00 AM  Dressing Intervention Dressing changed 03/13/2016  3:00 AM  Dressing Type Split gauze 03/13/2016  8:00 AM  Dressing Change Due 03/10/16 03/10/2016  8:00 AM  G Port Intake (mL) 180 ml 03/12/2016  4:00 PM  Output (mL) 160 mL 03/13/2016  6:00 AM     Urethral Catheter S. Bowman,RN Latex 16 Fr. (Active)  Indication for Insertion or Continuance of Catheter Peri-operative use for selective  surgical procedure 03/13/2016  8:00 AM  Site Assessment Clean;Intact;Swelling 03/13/2016  8:00 AM  Catheter Maintenance Bag below level of bladder;Catheter secured;Drainage bag/tubing not touching floor;No dependent loops 03/13/2016  8:00 AM  Collection Container Standard drainage bag 03/13/2016  8:00 AM  Securement Method Securing device (Describe) 03/13/2016  8:00 AM  Urinary Catheter Interventions Unclamped 03/13/2016  8:00 AM  Output (mL) 400 mL 03/13/2016 10:00 AM    Microbiology/Sepsis markers: Results for orders placed or performed during the hospital encounter of 02/09/16  MRSA PCR Screening     Status: None   Collection Time: 02/11/16 11:30 AM  Result Value Ref Range Status   MRSA by PCR NEGATIVE NEGATIVE Final    Comment:        The GeneXpert MRSA Assay (FDA approved for NASAL specimens only), is one component of a comprehensive MRSA colonization surveillance program. It is not intended to diagnose MRSA infection nor to guide or monitor treatment for MRSA infections.   Anaerobic culture     Status: None   Collection Time: 02/26/16  1:19 PM  Result Value Ref Range Status   Specimen Description FLUID PLEURAL RIGHT  Final   Special Requests POF ANCEF  Final   Gram Stain   Final    DEGENERATED CELLULAR MATERIAL PRESENT NO ORGANISMS SEEN    Culture NO ANAEROBES ISOLATED  Final   Report Status 03/02/2016 FINAL  Final  Body fluid culture     Status: None   Collection Time:  02/26/16  1:19 PM  Result Value Ref Range Status   Specimen Description FLUID RIGHT PLEURAL  Final   Special Requests POF ANCEF  Final   Gram Stain   Final    DEGENERATED CELLULAR MATERIAL PRESENT NO ORGANISMS SEEN    Culture   Final    MULTIPLE ORGANISMS PRESENT, NONE PREDOMINANT CRITICAL RESULT CALLED TO, READ BACK BY AND VERIFIED WITH: J PINEDA 02/27/16 @ 1314 M VESTAL    Report Status 02/28/2016 FINAL  Final  Culture, respiratory (NON-Expectorated)     Status: None   Collection Time: 03/03/16  1:17 PM   Result Value Ref Range Status   Specimen Description TRACHEAL ASPIRATE  Final   Special Requests Normal  Final   Gram Stain   Final    FEW WBC PRESENT,BOTH PMN AND MONONUCLEAR NO SQUAMOUS EPITHELIAL CELLS SEEN NO ORGANISMS SEEN Performed at [facility]    Culture   Final    FEW ENTEROBACTER CLOACAE Performed at [facility]    Report Status 03/06/2016 FINAL  Final   Organism ID, Bacteria ENTEROBACTER CLOACAE  Final      Susceptibility   Enterobacter cloacae - MIC*    CEFAZOLIN >=64 RESISTANT Resistant     CEFEPIME <=1 SENSITIVE Sensitive     CEFTAZIDIME >=64 RESISTANT Resistant     CEFTRIAXONE >=64 RESISTANT Resistant     CIPROFLOXACIN <=0.25 SENSITIVE Sensitive     GENTAMICIN <=1 SENSITIVE Sensitive     IMIPENEM <=0.25 SENSITIVE Sensitive     PIP/TAZO >=128 RESISTANT Resistant     TOBRAMYCIN <=1 SENSITIVE Sensitive     TRIMETH/SULFA Value in next row Sensitive      <=20 SENSITIVE(NOTE)    * FEW ENTEROBACTER CLOACAE    Anti-infectives:  Anti-infectives    Start     Dose/Rate Route Frequency Ordered Stop   03/08/16 1400  levofloxacin (LEVAQUIN) IVPB 750 mg     750 mg 100 mL/hr over 90 Minutes Intravenous Every 24 hours 03/08/16 0912 03/10/16 1501   03/06/16 1400  ceFEPIme (MAXIPIME) 2 g in dextrose 5 % 50 mL IVPB  Status:  Discontinued     2 g 100 mL/hr over 30 Minutes Intravenous Every 12 hours 03/06/16 1344 03/08/16 0912   03/01/16 1330  ceFAZolin (ANCEF) IVPB 2g/100 mL premix     2 g 200 mL/hr over 30 Minutes Intravenous To [facility] 02/29/16 0917 03/01/16 1452   02/11/16 0600  piperacillin-tazobactam (ZOSYN) IVPB 3.375 g  Status:  Discontinued     3.375 g 100 mL/hr over 30 Minutes Intravenous 4 times per day 02/10/16 2214 02/22/16 0853   02/10/16 1730  piperacillin-tazobactam (ZOSYN) IVPB 3.375 g  Status:  Discontinued     3.375 g 12.5 mL/hr over 240 Minutes Intravenous 3 times per day 02/10/16 1727 02/10/16 2214      Best  Practice/Protocols:  VTE Prophylaxis: Lovenox (prophylaxtic dose) Intermittent Sedation  Consults: Treatment Team:  Myrene Galas, MD Kerin Perna, MD    Events:  Subjective:    Overnight Issues: Stable   Objective:  Vital signs for last 24 hours: Temp:  [97.4 F (36.3 C)-97.8 F (36.6 C)] 97.4 F (36.3 C) (05/06 0800) Pulse Rate:  [62-96] 85 (05/06 1200) Resp:  [10-36] 25 (05/06 1200) BP: (93-129)/(52-75) 116/72 mmHg (05/06 1200) SpO2:  [87 %-100 %] 99 % (05/06 1200) FiO2 (%):  [40 %-60 %] 40 % (05/06 1120) Weight:  [130.7 kg (288 lb 2.3 oz)] 130.7 kg (288 lb 2.3 oz) (05/06  4098)  Hemodynamic parameters for last 24 hours:    Intake/Output from previous day: 05/05 0701 - 05/06 0700 In: 1485 [NG/GT:1215] Out: 2905 [Urine:2710; Drains:160; Chest Tube:35]  Intake/Output this shift: Total I/O In: -  Out: 400 [Urine:400]  Vent settings for last 24 hours: Vent Mode:  [-] PRVC FiO2 (%):  [40 %-60 %] 40 % Set Rate:  [20 bmp] 20 bmp Vt Set:  [490 mL] 490 mL PEEP:  [5 cmH20] 5 cmH20 Plateau Pressure:  [25 cmH20-28 cmH20] 28 cmH20  Physical Exam:  Neuro: sedated  HEENT/Neck: trach site more clean today  Resp: rhonchi bilaterally GI: wound clean  Results for orders placed or performed during the hospital encounter of 02/09/16 (from the past 24 hour(s))  Glucose, capillary     Status: Abnormal   Collection Time: 03/12/16  3:37 PM  Result Value Ref Range   Glucose-Capillary 116 (H) 65 - 99 mg/dL  Glucose, capillary     Status: Abnormal   Collection Time: 03/12/16  8:13 PM  Result Value Ref Range   Glucose-Capillary 154 (H) 65 - 99 mg/dL  Basic metabolic panel     Status: Abnormal   Collection Time: 03/12/16  9:11 PM  Result Value Ref Range   Sodium 145 135 - 145 mmol/L   Potassium 4.2 3.5 - 5.1 mmol/L   Chloride 110 101 - 111 mmol/L   CO2 27 22 - 32 mmol/L   Glucose, Bld 150 (H) 65 - 99 mg/dL   BUN 66 (H) 6 - 20 mg/dL   Creatinine, Ser 1.19 0.44 - 1.00  mg/dL   Calcium 8.3 (L) 8.9 - 10.3 mg/dL   GFR calc non Af Amer >60 >60 mL/min   GFR calc Af Amer >60 >60 mL/min   Anion gap 8 5 - 15  Glucose, capillary     Status: Abnormal   Collection Time: 03/12/16 11:20 PM  Result Value Ref Range   Glucose-Capillary 103 (H) 65 - 99 mg/dL  Glucose, capillary     Status: None   Collection Time: 03/13/16  3:12 AM  Result Value Ref Range   Glucose-Capillary 92 65 - 99 mg/dL  CBC with Differential/Platelet     Status: Abnormal   Collection Time: 03/13/16  6:00 AM  Result Value Ref Range   WBC 22.0 (H) 4.0 - 10.5 K/uL   RBC 2.43 (L) 3.87 - 5.11 MIL/uL   Hemoglobin 7.4 (L) 12.0 - 15.0 g/dL   HCT 14.7 (L) 82.9 - 56.2 %   MCV 101.6 (H) 78.0 - 100.0 fL   MCH 30.5 26.0 - 34.0 pg   MCHC 30.0 30.0 - 36.0 g/dL   RDW 13.0 (H) 86.5 - 78.4 %   Platelets 304 150 - 400 K/uL   Neutrophils Relative % 68 %   Band Neutrophils 4 %   Lymphocytes Relative 8 %   Monocytes Relative 10 %   Eosinophils Relative 10 %  Basic metabolic panel     Status: Abnormal   Collection Time: 03/13/16  6:00 AM  Result Value Ref Range   Sodium 145 135 - 145 mmol/L   Potassium 3.9 3.5 - 5.1 mmol/L   Chloride 110 101 - 111 mmol/L   CO2 27 22 - 32 mmol/L   Glucose, Bld 98 65 - 99 mg/dL   BUN 63 (H) 6 - 20 mg/dL   Creatinine, Ser 6.96 0.44 - 1.00 mg/dL   Calcium 8.4 (L) 8.9 - 10.3 mg/dL   GFR calc non Af Amer >60 >  60 mL/min   GFR calc Af Amer >60 >60 mL/min   Anion gap 8 5 - 15  Glucose, capillary     Status: Abnormal   Collection Time: 03/13/16  8:27 AM  Result Value Ref Range   Glucose-Capillary 102 (H) 65 - 99 mg/dL  Glucose, capillary     Status: Abnormal   Collection Time: 03/13/16 12:23 PM  Result Value Ref Range   Glucose-Capillary 112 (H) 65 - 99 mg/dL     Assessment/Plan:   NEURO  Altered Mental Status: agitation and pain   Plan: Change medications appropriately  PULM  Still has the retained hemothorax on the right, CT in place    Plan: CPM   CARDIO  Bradycardia    Plan: back on vent   RENAL  Urine output is good and renal function has been improving.   Plan: CPM  GI  Open wound is granulating well. no infection.   Plan: hold  tube feedings for vomiting until Sunday . Bowel are working  ID  Pulmonary infection has been treated. No other known infectious sorces   Plan: CPM. Off antibiotics  HEME  Anemia acute blood loss anemia and anemia of critical illness)   Plan: No blood for now  ENDO No known issues   Plan: CPM  Global Issues  Weaning to trach collar again. Tracheostomy site is better today with  more aggressive with wound care there.                                                             LOS: 33 days   Additional comments:None  Critical Care Total Time*: 30 Minutes  Sukhman Martine A. 03/13/2016  *Care during the described time interval was provided by me and/or other providers on the critical care team.  I have reviewed this patient's available data, including medical history, events of note, physical examination and test results as part of my evaluation.

## 2016-03-13 NOTE — Progress Notes (Addendum)
      [address]       [place] [id number]             [phone]       12 Days Post-Op Procedure(s) (LRB): ABDOMINAL WOUND CLOSURE (N/A) PERCUTANEOUS TRACHEOSTOMY (N/A)  Subjective: She had emesis last evening and TF stopped.  Objective: Vital signs in last 24 hours: Temp:  [97.4 F (36.3 C)-98.4 F (36.9 C)] 97.4 F (36.3 C) (05/06 0800) Pulse Rate:  [62-97] 81 (05/06 0810) Cardiac Rhythm:  [-] Normal sinus rhythm (05/06 0800) Resp:  [10-36] 27 (05/06 0810) BP: (93-129)/(52-75) 109/69 mmHg (05/06 0810) SpO2:  [87 %-100 %] 100 % (05/06 0810) FiO2 (%):  [40 %-60 %] 40 % (05/06 0810) Weight:  [288 lb 2.3 oz (130.7 kg)] 288 lb 2.3 oz (130.7 kg) (05/06 0624)     Intake/Output from previous day: 05/05 0701 - 05/06 0700 In: 1485 [NG/GT:1215] Out: 2905 [Urine:2710; Drains:160; Chest Tube:35]   Physical Exam:  Cardiovascular: RRR Pulmonary: Diminished breath sounds at bases Wounds: Dressing is clean and dry.  Wound under right breast has Nylon sutures securing it, but wound is starting to dehisce. Chest Tube: to suction, no air leak, dark bloody drainage  Lab Results: CBC:  Recent Labs  03/11/16 0415 03/13/16 0600  WBC 23.3* 22.0*  HGB 7.7* 7.4*  HCT 24.5* 24.7*  PLT 341 304   BMET:   Recent Labs  03/12/16 2111 03/13/16 0600  NA 145 145  K 4.2 3.9  CL 110 110  CO2 27 27  GLUCOSE 150* 98  BUN 66* 63*  CREATININE 0.80 0.74  CALCIUM 8.3* 8.4*    PT/INR: No results for input(s): LABPROT, INR in the last 72 hours. ABG:  INR: Will add last result for INR, ABG once components are confirmed Will add last 4 CBG results once components are confirmed  Assessment/Plan:  1. CV - Had bradycardia into the 40's late last night secondary to increased respiratory rate. SR in the 90's this am. 2.  Pulmonary - Left chest tube with 35 cc of output. TPA put in on Wednesday to help "breakdown" hematoma. No CXR taken today. Will order for am.Per  Dr. Donata ClayVan Trigt, left VATS NOT recommended at this time.   ZIMMERMAN,DONIELLE MPA-C 03/13/2016,9:25 AM  willstop twice a day Lovenox because of evidence of continued intra-thoracic bleeding on x-rays, persistent drop in hemoglobin Check tracheal aspirate for cultures with persistent elevated white count patient examined and medical record reviewed,agree with above note. Kathlee Nationseter Van Trigt III 03/13/2016

## 2016-03-14 ENCOUNTER — Inpatient Hospital Stay (HOSPITAL_COMMUNITY): Payer: Medicaid Other

## 2016-03-14 LAB — GLUCOSE, CAPILLARY
GLUCOSE-CAPILLARY: 85 mg/dL (ref 65–99)
GLUCOSE-CAPILLARY: 90 mg/dL (ref 65–99)
GLUCOSE-CAPILLARY: 111 mg/dL — AB (ref 65–99)
GLUCOSE-CAPILLARY: 117 mg/dL — AB (ref 65–99)
Glucose-Capillary: 118 mg/dL — ABNORMAL HIGH (ref 65–99)

## 2016-03-14 NOTE — Progress Notes (Addendum)
      [address]       [place] [id number]             [phone]       13 Days Post-Op Procedure(s) (LRB): ABDOMINAL WOUND CLOSURE (N/A) PERCUTANEOUS TRACHEOSTOMY (N/A)  Subjective: She is more awake this am  Objective: Vital signs in last 24 hours: Temp:  [97.3 F (36.3 C)-98.5 F (36.9 C)] 97.3 F (36.3 C) (05/07 0729) Pulse Rate:  [78-89] 89 (05/07 0800) Cardiac Rhythm:  [-] Normal sinus rhythm (05/07 0800) Resp:  [17-25] 21 (05/07 0800) BP: (97-117)/(54-83) 103/83 mmHg (05/07 0800) SpO2:  [97 %-100 %] 100 % (05/07 0800) FiO2 (%):  [40 %] 40 % (05/07 0730) Weight:  [282 lb 13.6 oz (128.3 kg)] 282 lb 13.6 oz (128.3 kg) (05/07 0500)     Intake/Output from previous day: 05/06 0701 - 05/07 0700 In: -  Out: 2195 [Urine:2095; Drains:20; Chest Tube:80]   Physical Exam:  Cardiovascular: RRR Pulmonary: Coarse breath sounds Wounds: Dressing is clean and dry.  Wound under right breast has Nylon sutures securing it, but wound is starting to dehisce. Chest Tube: to suction, no air leak, dark bloody drainage  Lab Results: CBC:  Recent Labs  03/13/16 0600  WBC 22.0*  HGB 7.4*  HCT 24.7*  PLT 304   BMET:   Recent Labs  03/12/16 2111 03/13/16 0600  NA 145 145  K 4.2 3.9  CL 110 110  CO2 27 27  GLUCOSE 150* 98  BUN 66* 63*  CREATININE 0.80 0.74  CALCIUM 8.3* 8.4*    PT/INR: No results for input(s): LABPROT, INR in the last 72 hours. ABG:  INR: Will add last result for INR, ABG once components are confirmed Will add last 4 CBG results once components are confirmed  Assessment/Plan:  1. CV - Had bradycardia into the 40's late last night secondary to increased respiratory rate. SR in the 90's this am. 2.  Pulmonary - Left chest tube with 80 cc of dark bloody output. TPA put in on Wednesday to help "breakdown" hematoma. CXR shows persistent consolidation RLL and some improvement in aeration on the left.Per Dr. Donata ClayVan Trigt, left VATS NOT  recommended at this time.   ZIMMERMAN,DONIELLE MPA-C 03/14/2016,9:23 AM  CXR with mild improvement lovenox stopped Leave tube to suction  patient examined and medical record reviewed,agree with above note. Kathlee Nationseter Van Trigt III 03/14/2016

## 2016-03-14 NOTE — Progress Notes (Signed)
Follow up - Trauma and Critical Care  Patient Details:    [name] Bell is an 35 y.o. female.  Lines/tubes : PICC Double Lumen 03/02/16 PICC Right Cephalic 50 cm 0 cm (Active)  Indication for Insertion or Continuance of Line Prolonged intravenous therapies 03/14/2016  8:00 AM  Exposed Catheter (cm) 0 cm 03/01/2016  8:00 AM  Site Assessment Clean;Dry;Intact 03/14/2016  8:00 AM  Lumen #1 Status Infusing 03/14/2016  8:00 AM  Lumen #2 Status Blood return noted;Flushed;Saline locked 03/14/2016  8:00 AM  Dressing Type Transparent 03/14/2016  8:00 AM  Dressing Status Clean;Dry;Intact;Antimicrobial disc in place 03/14/2016  8:00 AM  Line Care Connections checked and tightened 03/14/2016  8:00 AM  Dressing Intervention New dressing;Dressing changed 03/10/2016  3:00 AM  Dressing Change Due 03/17/16 03/14/2016  8:00 AM     Chest Tube 1 Right Pleural 36 Fr. (Active)  Suction -20 cm H2O 03/14/2016  8:00 AM  Chest Tube Air Leak None 03/14/2016  8:00 AM  Patency Intervention Tip/tilt 03/13/2016  8:00 AM  Drainage Description Dark red 03/14/2016  8:00 AM  Dressing Status Clean;Dry;Intact 03/14/2016  8:00 AM  Dressing Intervention Other (Comment) 03/10/2016  7:19 PM  Site Assessment Clean;Dry;Intact 03/14/2016  8:00 AM  Surrounding Skin Unable to view 03/14/2016  8:00 AM  Output (mL) 20 mL 03/14/2016  6:00 AM     Gastrostomy/Enterostomy Gastrostomy 24 Fr. LUQ (Active)  Surrounding Skin Macerated 03/14/2016  8:00 AM  Tube Status Patent 03/14/2016  8:00 AM  Drainage Appearance Purulent 03/14/2016  8:00 AM  Dressing Status Clean;Dry;Intact 03/14/2016  8:00 AM  Dressing Intervention Dressing changed 03/13/2016  3:00 AM  Dressing Type Split gauze 03/14/2016  8:00 AM  Dressing Change Due 03/10/16 03/10/2016  8:00 AM  G Port Intake (mL) 180 ml 03/12/2016  4:00 PM  Output (mL) 20 mL 03/14/2016  6:00 AM     Urethral Catheter S. Bowman,RN Latex 16 Fr. (Active)  Indication for Insertion or Continuance of Catheter Peri-operative use for selective surgical  procedure 03/14/2016  8:00 AM  Site Assessment Clean;Intact;Swelling 03/14/2016  8:00 AM  Catheter Maintenance Bag below level of bladder;Catheter secured;Drainage bag/tubing not touching floor;No dependent loops 03/14/2016  8:00 AM  Collection Container Standard drainage bag 03/14/2016  8:00 AM  Securement Method Securing device (Describe) 03/14/2016  8:00 AM  Urinary Catheter Interventions Unclamped 03/14/2016  8:00 AM  Output (mL) 225 mL 03/14/2016  7:19 AM    Microbiology/Sepsis markers: Results for orders placed or performed during the hospital encounter of 02/09/16  MRSA PCR Screening     Status: None   Collection Time: 02/11/16 11:30 AM  Result Value Ref Range Status   MRSA by PCR NEGATIVE NEGATIVE Final    Comment:        The GeneXpert MRSA Assay (FDA approved for NASAL specimens only), is one component of a comprehensive MRSA colonization surveillance program. It is not intended to diagnose MRSA infection nor to guide or monitor treatment for MRSA infections.   Anaerobic culture     Status: None   Collection Time: 02/26/16  1:19 PM  Result Value Ref Range Status   Specimen Description FLUID PLEURAL RIGHT  Final   Special Requests POF ANCEF  Final   Gram Stain   Final    DEGENERATED CELLULAR MATERIAL PRESENT NO ORGANISMS SEEN    Culture NO ANAEROBES ISOLATED  Final   Report Status 03/02/2016 FINAL  Final  Body fluid culture     Status: None   Collection  Time: 02/26/16  1:19 PM  Result Value Ref Range Status   Specimen Description FLUID RIGHT PLEURAL  Final   Special Requests POF ANCEF  Final   Gram Stain   Final    DEGENERATED CELLULAR MATERIAL PRESENT NO ORGANISMS SEEN    Culture   Final    MULTIPLE ORGANISMS PRESENT, NONE PREDOMINANT CRITICAL RESULT CALLED TO, READ BACK BY AND VERIFIED WITH: J PINEDA 02/27/16 @ 1314 M VESTAL    Report Status 02/28/2016 FINAL  Final  Culture, respiratory (NON-Expectorated)     Status: None   Collection Time: 03/03/16  1:17 PM  Result  Value Ref Range Status   Specimen Description TRACHEAL ASPIRATE  Final   Special Requests Normal  Final   Gram Stain   Final    FEW WBC PRESENT,BOTH PMN AND MONONUCLEAR NO SQUAMOUS EPITHELIAL CELLS SEEN NO ORGANISMS SEEN Performed at [facility]    Culture   Final    FEW ENTEROBACTER CLOACAE Performed at [facility]    Report Status 03/06/2016 FINAL  Final   Organism ID, Bacteria ENTEROBACTER CLOACAE  Final      Susceptibility   Enterobacter cloacae - MIC*    CEFAZOLIN >=64 RESISTANT Resistant     CEFEPIME <=1 SENSITIVE Sensitive     CEFTAZIDIME >=64 RESISTANT Resistant     CEFTRIAXONE >=64 RESISTANT Resistant     CIPROFLOXACIN <=0.25 SENSITIVE Sensitive     GENTAMICIN <=1 SENSITIVE Sensitive     IMIPENEM <=0.25 SENSITIVE Sensitive     PIP/TAZO >=128 RESISTANT Resistant     TOBRAMYCIN <=1 SENSITIVE Sensitive     TRIMETH/SULFA Value in next row Sensitive      <=20 SENSITIVE(NOTE)    * FEW ENTEROBACTER CLOACAE    Anti-infectives:  Anti-infectives    Start     Dose/Rate Route Frequency Ordered Stop   03/08/16 1400  levofloxacin (LEVAQUIN) IVPB 750 mg     750 mg 100 mL/hr over 90 Minutes Intravenous Every 24 hours 03/08/16 0912 03/10/16 1501   03/06/16 1400  ceFEPIme (MAXIPIME) 2 g in dextrose 5 % 50 mL IVPB  Status:  Discontinued     2 g 100 mL/hr over 30 Minutes Intravenous Every 12 hours 03/06/16 1344 03/08/16 0912   03/01/16 1330  ceFAZolin (ANCEF) IVPB 2g/100 mL premix     2 g 200 mL/hr over 30 Minutes Intravenous To ShortStay Surgical 02/29/16 0917 03/01/16 1452   02/11/16 0600  piperacillin-tazobactam (ZOSYN) IVPB 3.375 g  Status:  Discontinued     3.375 g 100 mL/hr over 30 Minutes Intravenous 4 times per day 02/10/16 2214 02/22/16 0853   02/10/16 1730  piperacillin-tazobactam (ZOSYN) IVPB 3.375 g  Status:  Discontinued     3.375 g 12.5 mL/hr over 240 Minutes Intravenous 3 times per day 02/10/16 1727 02/10/16 2214      Best Practice/Protocols:   VTE Prophylaxis: Lovenox (prophylaxtic dose) Intermittent Sedation  Consults: Treatment Team:  Myrene GalasMichael [name], MD Kerin PernaPeter Van Trigt, MD    Events:  Subjective:    Overnight Issues: NO MORE VOMITING OR BRADYCARDIA   Objective:  Vital signs for last 24 hours: Temp:  [97.3 F (36.3 C)-98.5 F (36.9 C)] 97.3 F (36.3 C) (05/07 0729) Pulse Rate:  [78-89] 89 (05/07 0800) Resp:  [17-25] 21 (05/07 0800) BP: (97-117)/(54-83) 103/83 mmHg (05/07 0800) SpO2:  [97 %-100 %] 100 % (05/07 0800) FiO2 (%):  [40 %] 40 % (05/07 0730) Weight:  [128.3 kg (282 lb 13.6 oz)] 128.3 kg (282  lb 13.6 oz) (05/07 0500)  Hemodynamic parameters for last 24 hours:    Intake/Output from previous day: 05/06 0701 - 05/07 0700 In: -  Out: 2195 [Urine:2095; Drains:20; Chest Tube:80]  Intake/Output this shift: Total I/O In: 2.5 [NG/GT:2.5] Out: 225 [Urine:225]  Vent settings for last 24 hours: Vent Mode:  [-] PRVC FiO2 (%):  [40 %] 40 % Set Rate:  [20 bmp] 20 bmp Vt Set:  [490 mL] 490 mL PEEP:  [5 cmH20] 5 cmH20 Plateau Pressure:  [25 cmH20-33 cmH20] 25 cmH20  Physical Exam:  General: no respiratory distress Resp: clear to auscultation bilaterally CVS: regular rate and rhythm, S1, S2 normal, no murmur, click, rub or gallop GI: wound clean and Ex fix on pelvis in place   Results for orders placed or performed during the hospital encounter of 02/09/16 (from the past 24 hour(s))  Glucose, capillary     Status: Abnormal   Collection Time: 03/13/16 12:23 PM  Result Value Ref Range   Glucose-Capillary 112 (H) 65 - 99 mg/dL  Glucose, capillary     Status: Abnormal   Collection Time: 03/13/16  4:48 PM  Result Value Ref Range   Glucose-Capillary 100 (H) 65 - 99 mg/dL  Glucose, capillary     Status: None   Collection Time: 03/13/16  7:51 PM  Result Value Ref Range   Glucose-Capillary 98 65 - 99 mg/dL  Glucose, capillary     Status: None   Collection Time: 03/13/16 11:19 PM  Result Value Ref Range    Glucose-Capillary 96 65 - 99 mg/dL  Glucose, capillary     Status: None   Collection Time: 03/14/16  3:32 AM  Result Value Ref Range   Glucose-Capillary 85 65 - 99 mg/dL  Glucose, capillary     Status: None   Collection Time: 03/14/16  7:35 AM  Result Value Ref Range   Glucose-Capillary 90 65 - 99 mg/dL     Assessment/Plan:   NEURO  Altered Mental Status: agitation and pain   Plan: Change medications appropriately  PULM  Still has the retained hemothorax on the right, CT in place   VDRF IMPROVING    Plan: CPM  CARDIO  NO MORE BRADYCARDIA   Plan: back on TC  RENAL  Urine output is good and renal function has been improving.   Plan: CPM  GI  Open wound is granulating well. no infection.   Plan: Restart TF  . Bowel are working  ID  Pulmonary infection has been treated. No other known infectious sorces   Plan: CPM. Off antibiotics  HEME  Anemia acute blood loss anemia and anemia of critical illness)   Plan: No blood for now  ENDO No known issues   Plan: CPM  Global Issues  Weaning to trach collar again. Tracheostomy site is better today with more aggressive with wound care there                                                                 LOS: 34 days   Additional comments:None, I reviewed the patient's new clinical lab test results. .. and I reviewed the patients new imaging test results. .  Critical Care Total Time*: 30 Minutes  Masey Scheiber A. 03/14/2016  *Care during the described time interval  was provided by me and/or other providers on the critical care team.  I have reviewed this patient's available data, including medical history, events of note, physical examination and test results as part of my evaluation.

## 2016-03-14 NOTE — Progress Notes (Signed)
RN called- pt RR 40's, increased WOB noted.  Placed pt back on vent.  Rn aware.

## 2016-03-15 ENCOUNTER — Inpatient Hospital Stay (HOSPITAL_COMMUNITY): Payer: Medicaid Other

## 2016-03-15 LAB — GLUCOSE, CAPILLARY
GLUCOSE-CAPILLARY: 114 mg/dL — AB (ref 65–99)
GLUCOSE-CAPILLARY: 113 mg/dL — AB (ref 65–99)
GLUCOSE-CAPILLARY: 125 mg/dL — AB (ref 65–99)
Glucose-Capillary: 107 mg/dL — ABNORMAL HIGH (ref 65–99)
Glucose-Capillary: 113 mg/dL — ABNORMAL HIGH (ref 65–99)
Glucose-Capillary: 119 mg/dL — ABNORMAL HIGH (ref 65–99)
Glucose-Capillary: 125 mg/dL — ABNORMAL HIGH (ref 65–99)

## 2016-03-15 LAB — BASIC METABOLIC PANEL
Anion gap: 9 (ref 5–15)
BUN: 45 mg/dL — AB (ref 6–20)
CHLORIDE: 113 mmol/L — AB (ref 101–111)
CO2: 27 mmol/L (ref 22–32)
CREATININE: 0.59 mg/dL (ref 0.44–1.00)
Calcium: 7.9 mg/dL — ABNORMAL LOW (ref 8.9–10.3)
GFR calc non Af Amer: >60 mL/min (ref >60)
Glucose, Bld: 114 mg/dL — ABNORMAL HIGH (ref 65–99)
Potassium: 3.7 mmol/L (ref 3.5–5.1)
Sodium: 149 mmol/L — ABNORMAL HIGH (ref 135–145)

## 2016-03-15 LAB — CBC
HEMATOCRIT: 24.6 % — AB (ref 36.0–46.0)
HEMOGLOBIN: 7.1 g/dL — AB (ref 12.0–15.0)
MCH: 29.5 pg (ref 26.0–34.0)
MCHC: 28.9 g/dL — AB (ref 30.0–36.0)
MCV: 102.1 fL — AB (ref 78.0–100.0)
Platelets: 288 10*3/uL (ref 150–400)
RBC: 2.41 MIL/uL — ABNORMAL LOW (ref 3.87–5.11)
RDW: 22.2 % — ABNORMAL HIGH (ref 11.5–15.5)
WBC: 15.4 10*3/uL — ABNORMAL HIGH (ref 4.0–10.5)

## 2016-03-15 MED ORDER — POTASSIUM CL IN DEXTROSE 5% 20 MEQ/L IV SOLN
20.0000 meq | INTRAVENOUS | Status: DC
  Administered 2016-03-15 – 2016-03-23 (×8): 20 meq via INTRAVENOUS
  Filled 2016-03-15 (×16): qty 1000

## 2016-03-15 NOTE — Progress Notes (Signed)
Trauma Service Note  Subjective: Patient doing much better.  This is the best that I have seen her.  Smiled  At me this morning.  Seems weak in her extremities.  Objective: Vital signs in last 24 hours: Temp:  [97.1 F (36.2 C)-99.1 F (37.3 C)] 98.7 F (37.1 C) (05/08 0800) Pulse Rate:  [42-96] 90 (05/08 0900) Resp:  [0-43] 13 (05/08 0900) BP: (101-125)/(51-77) 109/61 mmHg (05/08 0900) SpO2:  [94 %-100 %] 100 % (05/08 0900) FiO2 (%):  [30 %-40 %] 40 % (05/08 0845) Weight:  [126.7 kg (279 lb 5.2 oz)] 126.7 kg (279 lb 5.2 oz) (05/08 0500) Last BM Date: 03/15/16  Intake/Output from previous day: 05/07 0701 - 05/08 0700 In: 2362.5 [I.V.:130; NG/GT:2232.5] Out: 2355 [Urine:2245; Chest Tube:110] Intake/Output this shift: Total I/O In: 492.5 [I.V.:10; NG/GT:482.5] Out: 280 [Urine:280]  General: No acute distress.  On trach collar and looking very comfortable.  Lungs: Mildly coarse breath sounds., sats are good.  Abd: Soft, good bowel sounds.  Wound is great  Extremities: No changes  Neuro: Intact, but weak globally.    Lab Results: CBC   Recent Labs  03/13/16 0600 03/15/16 0408  WBC 22.0* 15.4*  HGB 7.4* 7.1*  HCT 24.7* 24.6*  PLT 304 288   BMET  Recent Labs  03/13/16 0600 03/15/16 0408  NA 145 149*  K 3.9 3.7  CL 110 113*  CO2 27 27  GLUCOSE 98 114*  BUN 63* 45*  CREATININE 0.74 0.59  CALCIUM 8.4* 7.9*   PT/INR No results for input(s): LABPROT, INR in the last 72 hours. ABG No results for input(s): PHART, HCO3 in the last 72 hours.  Invalid input(s): PCO2, PO2  Studies/Results: Dg Chest Port 1 View  03/15/2016  CLINICAL DATA:  Lung consolidation with difficulty breathing EXAM: PORTABLE CHEST 1 VIEW COMPARISON:  Mar 14, 2016 FINDINGS: Tracheostomy catheter tip is 5.8 cm above the carina. Central catheter tip is in the superior vena cava near the cavoatrial junction, stable. There is a chest tube on the right, unchanged in position. The side port of  the chest tube is outside the pleural space laterally on the right. There is no evident pneumothorax. A displaced fracture of the lateral right second rib is again noted. There is extensive apical pleural thickening on the right, likely due to hemorrhage from recent fracture. There are layering pleural effusions bilaterally, larger on the right than on the left. There is patchy consolidation in the left base and adjacent to the major fissure on the right. There is stable cardiac prominence with pulmonary vascularity within normal limits. No adenopathy evident. IMPRESSION: Areas of probable hemorrhage and airspace consolidation remain bilaterally, more on the right than on the left. There are bilateral effusions, larger on the right than on the left, stable. Stable cardiac prominence. Catheter positions as described without pneumothorax. Note that the side port of the chest tube on the right is outside of the pleural space. No pneumothorax evident. Electronically Signed   By: Bretta Bang III M.D.   On: 03/15/2016 07:51   Dg Chest Port 1 View  03/14/2016  CLINICAL DATA:  Patient with history of pneumothorax. EXAM: PORTABLE CHEST 1 VIEW COMPARISON:  Chest radiograph 03/12/2016. FINDINGS: Tracheostomy tube terminates in the mid trachea. Right upper extremity PICC line tip projects over the superior cavoatrial junction. Right chest tube stable in position. Monitoring leads overlie the patient. Stable cardiac and mediastinal contours. Persistent moderate right pleural effusion with consolidation throughout the right lung.  Improved aeration of the left mid lower lung. No pneumothorax. Re- demonstrated multiple right-sided rib fractures. IMPRESSION: Stable support apparatus. Interval improvement aeration left mid and lower lung. Persistent moderate right pleural effusion and consolidation within the right lung. Electronically Signed   By: Annia Beltrew  Davis M.D.   On: 03/14/2016 08:32     Anti-infectives: Anti-infectives    Start     Dose/Rate Route Frequency Ordered Stop   03/08/16 1400  levofloxacin (LEVAQUIN) IVPB 750 mg     750 mg 100 mL/hr over 90 Minutes Intravenous Every 24 hours 03/08/16 0912 03/10/16 1501   03/06/16 1400  ceFEPIme (MAXIPIME) 2 g in dextrose 5 % 50 mL IVPB  Status:  Discontinued     2 g 100 mL/hr over 30 Minutes Intravenous Every 12 hours 03/06/16 1344 03/08/16 0912   03/01/16 1330  ceFAZolin (ANCEF) IVPB 2g/100 mL premix     2 g 200 mL/hr over 30 Minutes Intravenous To ShortStay Surgical 02/29/16 0917 03/01/16 1452   02/11/16 0600  piperacillin-tazobactam (ZOSYN) IVPB 3.375 g  Status:  Discontinued     3.375 g 100 mL/hr over 30 Minutes Intravenous 4 times per day 02/10/16 2214 02/22/16 0853   02/10/16 1730  piperacillin-tazobactam (ZOSYN) IVPB 3.375 g  Status:  Discontinued     3.375 g 12.5 mL/hr over 240 Minutes Intravenous 3 times per day 02/10/16 1727 02/10/16 2214      Assessment/Plan: s/p Procedure(s): ABDOMINAL WOUND CLOSURE PERCUTANEOUS TRACHEOSTOMY Aggressive PT/OT.  Try to keep off the ventilator 24 hours.  LOS: 35 days   Marta LamasJames O. Gae BonWyatt, III, MD, FACS [phone] Trauma Surgeon 03/15/2016

## 2016-03-15 NOTE — Progress Notes (Addendum)
      [address]       [place] [id number]             [phone]      14 Days Post-Op Procedure(s) (LRB): ABDOMINAL WOUND CLOSURE (N/A) PERCUTANEOUS TRACHEOSTOMY (N/A)   Subjective:  Awake on vent this morning.    Objective: Vital signs in last 24 hours: Temp:  [97.1 F (36.2 C)-99.1 F (37.3 C)] 98.7 F (37.1 C) (05/08 0800) Pulse Rate:  [42-96] 92 (05/08 0845) Cardiac Rhythm:  [-] Normal sinus rhythm (05/08 0400) Resp:  [0-43] 18 (05/08 0845) BP: (101-125)/(51-77) 125/71 mmHg (05/08 0800) SpO2:  [94 %-100 %] 100 % (05/08 0845) FiO2 (%):  [30 %-40 %] 40 % (05/08 0845) Weight:  [279 lb 5.2 oz (126.7 kg)] 279 lb 5.2 oz (126.7 kg) (05/08 0500)  Intake/Output from previous day: 05/07 0701 - 05/08 0700 In: 2362.5 [I.V.:130; NG/GT:2232.5] Out: 2355 [Urine:2245; Chest Tube:110] Intake/Output this shift: Total I/O In: 482.5 [NG/GT:482.5] Out: 280 [Urine:280]  General appearance: alert and remains on vent Heart: regular rate and rhythm Lungs: coarse bilaterally, diminised on right Wound: clean and dry  Lab Results:  Recent Labs  03/13/16 0600 03/15/16 0408  WBC 22.0* 15.4*  HGB 7.4* 7.1*  HCT 24.7* 24.6*  PLT 304 288   BMET:  Recent Labs  03/13/16 0600 03/15/16 0408  NA 145 149*  K 3.9 3.7  CL 110 113*  CO2 27 27  GLUCOSE 98 114*  BUN 63* 45*  CREATININE 0.74 0.59  CALCIUM 8.4* 7.9*    PT/INR: No results for input(s): LABPROT, INR in the last 72 hours. ABG    Component Value Date/Time   PHART 7.508* 02/28/2016 0642   HCO3 25.5* 02/28/2016 0642   TCO2 26.5 02/28/2016 0642   ACIDBASEDEF 2.0 02/13/2016 0411   O2SAT 99.2 02/28/2016 0642   CBG (last 3)   Recent Labs  03/14/16 2346 03/15/16 0322 03/15/16 0755  GLUCAP 119* 107* 113*    Assessment/Plan: S/P Procedure(s) (LRB): ABDOMINAL WOUND CLOSURE (N/A) PERCUTANEOUS TRACHEOSTOMY (N/A)  1. Chest tube- 70 cc output yesterday, stable position on CXR... Continues to have  right sided pleural effusion, rib fractures 2. Dispo- continue chest tube, care per trauma   LOS: 35 days    BARRETT, ERIN 03/15/2016  Leave chest  Tube to suction

## 2016-03-16 LAB — GLUCOSE, CAPILLARY
GLUCOSE-CAPILLARY: 120 mg/dL — AB (ref 65–99)
GLUCOSE-CAPILLARY: 114 mg/dL — AB (ref 65–99)
GLUCOSE-CAPILLARY: 138 mg/dL — AB (ref 65–99)
GLUCOSE-CAPILLARY: 118 mg/dL — AB (ref 65–99)
Glucose-Capillary: 83 mg/dL (ref 65–99)
Glucose-Capillary: 128 mg/dL — ABNORMAL HIGH (ref 65–99)

## 2016-03-16 LAB — CULTURE, RESPIRATORY W GRAM STAIN
Culture: NORMAL
Special Requests: NORMAL

## 2016-03-16 MED ORDER — PRO-STAT SUGAR FREE PO LIQD
30.0000 mL | Freq: Every day | ORAL | Status: DC
  Administered 2016-03-17 – 2016-03-25 (×8): 30 mL
  Filled 2016-03-16 (×8): qty 30

## 2016-03-16 MED ORDER — PIVOT 1.5 CAL PO LIQD
1000.0000 mL | ORAL | Status: DC
  Administered 2016-03-16 – 2016-03-18 (×4): 1000 mL
  Administered 2016-03-19
  Administered 2016-03-19 – 2016-03-24 (×6): 1000 mL
  Filled 2016-03-16 (×9): qty 1000

## 2016-03-16 NOTE — Progress Notes (Signed)
Nutrition Follow-up  DOCUMENTATION CODES:   Obesity unspecified  INTERVENTION:   Increase Pivot 1.5 to 60 ml/hr Decrease Prostat to 30 ml daily  Provides: 2260 kcal, 150 grams protein, and 1092 ml H2O Total free water: 2692 ml  NUTRITION DIAGNOSIS:   Increased nutrient needs related to wound healing, catabolic illness as evidenced by estimated needs. Ongoing.   GOAL:   Patient will meet greater than or equal to 90% of their needs Progressing.   MONITOR:   Skin, I & O's, TF tolerance, Weight trends  ASSESSMENT:   Pt from [place] with no past medical hx admitted after suicide attempt by ingestion of Drano, 2 bottles of acetaminophen, nyquil, and possibly 6 alprazolam pills and jumping from 5th floor balcony of hotel. 4/3 pt is s/p exp lap, hepatorraphy, SBR, cholecystectomy, preperitoneal pelvic packing, ex fix pelvis, R HPTX, Bil pulmonary contusions. Plan for repeat OR 4/5 for another possible SBR. Abdomen remains open.   4/7 s/p exp lap, abd VAC dressing change 4/7 TF started 4/9 Pivot 1.5 reached goal of 30 ml/hr, prostat added 60 ml TID 4/10 repeat OR with washout, abdomen left open with negative pressure wound dressing (VAC), sacral screws 4/12 PEG, ABRA placed 4/14 screw fixation at S1/S2 4/24 trach/abd closed  Left chest tube: 70 ml out 5/8 Pt discussed during ICU rounds and with RN.  Pt has been off vent > 24 hrs.  Pt very tired at time of visit had just had a bath and had speech therapy.   Medications reviewed and include: KCl in IVF Free water: 400 ml every 6 hours = 1600 ml CBG's: 83-125  Diet Order:  Diet NPO time specified  Skin:  Wound (see comment) (stage II hip, surgical incisions, lacerations)  Last BM:  5/7  Height:   Ht Readings from Last 1 Encounters:  02/09/16 5\' 11"  (1.803 m)    Weight:   Wt Readings from Last 1 Encounters:  03/16/16 278 lb 10.6 oz (126.4 kg)    Ideal Body Weight:  70.4 kg  BMI:  Body mass index is 38.88  kg/(m^2).  Estimated Nutritional Needs:   Kcal:  2200-2400  Protein:  130-150 grams  Fluid:  >2.2 L/day  EDUCATION NEEDS:   No education needs identified at this time  Kendell BaneHeather Jerelle Virden RD, LDN, CNSC [phone] Pager [phone] After Hours Pager

## 2016-03-16 NOTE — Evaluation (Signed)
[name] Speaking Valve - Evaluation Patient Details  Name: [name] Bell MRN: [id number] Date of Birth: 02-22-1981  Today's Date: 03/16/2016 Time: 7829-5621 SLP Time Calculation (min) (ACUTE ONLY): 25 min  Past Medical History: History reviewed. No pertinent past medical history. Past Surgical History:  Past Surgical History  Procedure Laterality Date  . Esophagogastroduodenoscopy N/A 02/10/2016    Procedure: ESOPHAGOGASTRODUODENOSCOPY (EGD);  Surgeon: Sherrilyn Rist, MD;  Location: [facility] ENDOSCOPY;  Service: Endoscopy;  Laterality: N/A;  . Laparotomy N/A 02/10/2016    Procedure: EXPLORATORY LAPAROTOMY, removal of packs,  cauterization of liver, repacking of liver, and open abdomen vac application;  Surgeon: [name], MD;  Location: [facility] OR;  Service: General;  Laterality: N/A;  . Laparotomy N/A 02/11/2016    Procedure: EXPLORATORY LAPAROTOMY VAC CHANGE ;  Surgeon: Jimmye Norman, MD;  Location: [facility] OR;  Service: General;  Laterality: N/A;  . Chest tube insertion Right 02/11/2016    Procedure: CHEST TUBE INSERTION;  Surgeon: Jimmye Norman, MD;  Location: [facility] OR;  Service: General;  Laterality: Right;  . Laparotomy N/A 02/13/2016    Procedure: EXPLORATORY LAPAROTOMY, REMOVAL OF PACKS, ABDOMINAL VAC DRESSING CHANGE;  Surgeon: [name], MD;  Location: [facility] OR;  Service: General;  Laterality: N/A;  . Laparotomy N/A 02/18/2016    Procedure: EXPLORATORY LAPAROTOMY, PLACEMENT OF ABRA ABDOMINAL WALL CLOSURE SET;  Surgeon: Jimmye Norman, MD;  Location: [facility] OR;  Service: General;  Laterality: N/A;  . Chest tube insertion Right 02/26/2016    Procedure: CHEST TUBE INSERTION;  Surgeon: Kerin Perna, MD;  Location: [facility] OR;  Service: Thoracic;  Laterality: Right;  . Wound debridement N/A 03/01/2016    Procedure: ABDOMINAL WOUND CLOSURE;  Surgeon: Jimmye Norman, MD;  Location: [facility] OR;  Service: General;  Laterality: N/A;  . Percutaneous tracheostomy N/A 03/01/2016    Procedure: PERCUTANEOUS TRACHEOSTOMY;  Surgeon:  Jimmye Norman, MD;  Location: [facility] OR;  Service: General;  Laterality: N/A;  . Laparotomy N/A 02/09/2016    Procedure: EXPLORATORY LAPAROTOMY;  Surgeon: Jimmye Norman, MD;  Location: [facility] OR;  Service: General;  Laterality: N/A;  . Cholecystectomy  02/09/2016    Procedure: CHOLECYSTECTOMY;  Surgeon: Jimmye Norman, MD;  Location: [facility] OR;  Service: General;;  . Bowel resection  02/09/2016    Procedure: SMALL BOWEL RESECTION, MESENTERIC REPAIR;  Surgeon: Jimmye Norman, MD;  Location: [facility] OR;  Service: General;;  . Application of wound vac  02/09/2016    Procedure: APPLICATION OF WOUND VAC;  Surgeon: Jimmye Norman, MD;  Location: [facility] OR;  Service: General;;  . Laceration repair  02/09/2016    Procedure: REPAIR LIVER LACERATION;  Surgeon: Jimmye Norman, MD;  Location: [facility] OR;  Service: General;;  . External fixation pelvis  02/09/2016    Procedure: EXTERNAL FIXATION PELVIS;  Surgeon: Myrene Galas, MD;  Location: [facility] OR;  Service: Orthopedics;;  . Laparotomy N/A 02/16/2016    Procedure: EXPLORATORY LAPAROTOMY, ABDOMINAL WASH OUT;  Surgeon: [name], MD;  Location: [facility] OR;  Service: General;  Laterality: N/A;  . Application of wound vac N/A 02/16/2016    Procedure: RE-APPLICATION OF WOUND VAC;  Surgeon: [name], MD;  Location: [facility] OR;  Service: General;  Laterality: N/A;  . Sacro-iliac pinning Right 02/16/2016    Procedure: Loyal Gambler;  Surgeon: Myrene Galas, MD;  Location: [facility] OR;  Service: Orthopedics;  Laterality: Right;   HPI:  [name] Bell is a 35 y.o. female.PMH unknown.Pt attempted suicide: taped DNR note to chest, drank Drano, then jumped off 5  story building ([address]S/p 02/09/16 gel foam embolization of bil internal iliac arteries.S/p 02/09/16 ex lap with cholecystectomy, resection SB and mesenteric repair, liver lac repair, application or wound vac.S/p 02/09/16 closed reduction of pelvic ring, external pelvic fixation.Also has right metacarpal fracture in splint. Has #6 cuffed trach and has  tolerated trach collar for 24 hours.   Assessment / Plan / Recommendation Clinical Impression  Pt mild-moderately anxious but willing to attempt PMSV (sister Joyce GrossKay  present). Tolerated deflation of cuff without difficulty; valve donned for intervals of 3 -15 (longest) seconds. Mobilization of secretions to oral cavity improved with valve, however unable to adequately clear. Vital signs were within normal range (highest RR 32) with signs of back pressure and slight dyspnea and restless indicative of inadequate exhalation. She attempted to vocalize which resulted in dysphonia without true vocal cord phonation. Pt progressed throughout assessement well and prognosis for communication and secretion mobilzation using PMV is good. If she is able to remain off vent she would benefit from cuffless trach. PMSV with ST only.     SLP Assessment  Patient needs continued Speech Lanaguage Pathology Services    Follow Up Recommendations   (TBD)    Frequency and Duration min 2x/week  2 weeks    PMSV Trial PMSV was placed for: intervals of 3-15 seconds Able to redirect subglottic air through upper airway: Yes Able to Attain Phonation: No Able to Expectorate Secretions: Yes Level of Secretion Expectoration with PMSV: Oral Breath Support for Phonation:  (mod-severe) Intelligibility: Unable to assess (comment) Respirations During Trial:  (20-32) SpO2 During Trial:  (95) Pulse During Trial:  (95-110) Behavior: Alert;Anxious   Tracheostomy Tube       Vent Dependency  FiO2 (%): 40 %    Cuff Deflation Trial  GO Tolerated Cuff Deflation: Yes Length of Time for Cuff Deflation Trial: 18 min Behavior: Anxious;Alert;Responsive to questions        Royce MacadamiaLitaker, Ekin Pilar Willis 03/16/2016, 2:09 PM   Breck CoonsLisa Willis Lonell FaceLitaker M.Ed [employer]-SLP Pager [phone]

## 2016-03-16 NOTE — Progress Notes (Addendum)
      [address]       [place] [id number]             [phone]       15 Days Post-Op Procedure(s) (LRB): ABDOMINAL WOUND CLOSURE (N/A) PERCUTANEOUS TRACHEOSTOMY (N/A)  Subjective: She is on trach collar. Her sheets are being changed this am as she was just cleaned up.  Objective: Vital signs in last 24 hours: Temp:  [98.2 F (36.8 C)-98.7 F (37.1 C)] 98.2 F (36.8 C) (05/09 0800) Pulse Rate:  [39-100] 96 (05/09 0900) Cardiac Rhythm:  [-] Normal sinus rhythm (05/09 0600) Resp:  [3-41] 16 (05/09 0900) BP: (111-132)/(63-92) 115/75 mmHg (05/09 0900) SpO2:  [93 %-100 %] 100 % (05/09 0900) FiO2 (%):  [35 %-40 %] 40 % (05/09 0823) Weight:  [278 lb 10.6 oz (126.4 kg)] 278 lb 10.6 oz (126.4 kg) (05/09 0500)     Intake/Output from previous day: 05/08 0701 - 05/09 0700 In: 2573.3 [I.V.:993.3; NG/GT:1580] Out: 3245 [Urine:3175; Chest Tube:70]   Physical Exam:  Cardiovascular: RRR Pulmonary: Coarse breath sounds Wounds: Dressing is clean and dry.  Chest Tube: to suction, no air leak, dark bloody drainage  Lab Results: CBC:  Recent Labs  03/15/16 0408  WBC 15.4*  HGB 7.1*  HCT 24.6*  PLT 288   BMET:   Recent Labs  03/15/16 0408  NA 149*  K 3.7  CL 113*  CO2 27  GLUCOSE 114*  BUN 45*  CREATININE 0.59  CALCIUM 7.9*    PT/INR: No results for input(s): LABPROT, INR in the last 72 hours. ABG:  INR: Will add last result for INR, ABG once components are confirmed Will add last 4 CBG results once components are confirmed  Assessment/Plan:  1. CV - Had bradycardia into the 40's late last night secondary to increased respiratory rate. SR in the 90's this am. 2.  Pulmonary - Left chest tube with 70 cc of dark bloody output. CXR ordered but not done yet.    ZIMMERMAN,DONIELLE MPA-C 03/16/2016,10:27 AM   Chest x-ray performed today shows increased opacity at the right hilar area.her white count is back up to 19,000 and she is afebrile.  Antibiotics for her previous gram-negative pneumonia have been discontinued. The patient is back on Lovenox.  Chest tube still draining dark blood 50-100 cc daily Will recheck tracheal aspirate for culture Consider stopping or reducing Lovenox to reduce right pleural-parenchymal bleeding

## 2016-03-16 NOTE — Progress Notes (Signed)
Trauma Service Note  Subjective: Patient doing better today and has been on trach collar since yesterday.  Seems very weak and tired.  Will respond  Objective: Vital signs in last 24 hours: Temp:  [98.3 F (36.8 C)-98.7 F (37.1 C)] 98.5 F (36.9 C) (05/09 0400) Pulse Rate:  [39-100] 84 (05/09 0823) Resp:  [3-41] 14 (05/09 0823) BP: (109-132)/(61-92) 124/75 mmHg (05/09 0823) SpO2:  [93 %-100 %] 97 % (05/09 0800) FiO2 (%):  [35 %-40 %] 40 % (05/09 0823) Weight:  [126.4 kg (278 lb 10.6 oz)] 126.4 kg (278 lb 10.6 oz) (05/09 0500) Last BM Date: 03/14/16  Intake/Output from previous day: 05/08 0701 - 05/09 0700 In: 2573.3 [I.V.:993.3; NG/GT:1580] Out: 3245 [Urine:3175; Chest Tube:70] Intake/Output this shift: Total I/O In: 480 [I.V.:50; NG/GT:430] Out: 330 [Urine:330]  General: No acute distress  Lungs: CLear.  CXR yesterday seems better.  She may be learing without VATS  Abd: Soft, good bowel sounds.  Wound is excellent.  Extremities: No changes  Neuro: Intact  Lab Results: CBC   Recent Labs  03/15/16 0408  WBC 15.4*  HGB 7.1*  HCT 24.6*  PLT 288   BMET  Recent Labs  03/15/16 0408  NA 149*  K 3.7  CL 113*  CO2 27  GLUCOSE 114*  BUN 45*  CREATININE 0.59  CALCIUM 7.9*   PT/INR No results for input(s): LABPROT, INR in the last 72 hours. ABG No results for input(s): PHART, HCO3 in the last 72 hours.  Invalid input(s): PCO2, PO2  Studies/Results: Dg Chest Port 1 View  03/15/2016  CLINICAL DATA:  Lung consolidation with difficulty breathing EXAM: PORTABLE CHEST 1 VIEW COMPARISON:  Mar 14, 2016 FINDINGS: Tracheostomy catheter tip is 5.8 cm above the carina. Central catheter tip is in the superior vena cava near the cavoatrial junction, stable. There is a chest tube on the right, unchanged in position. The side port of the chest tube is outside the pleural space laterally on the right. There is no evident pneumothorax. A displaced fracture of the lateral  right second rib is again noted. There is extensive apical pleural thickening on the right, likely due to hemorrhage from recent fracture. There are layering pleural effusions bilaterally, larger on the right than on the left. There is patchy consolidation in the left base and adjacent to the major fissure on the right. There is stable cardiac prominence with pulmonary vascularity within normal limits. No adenopathy evident. IMPRESSION: Areas of probable hemorrhage and airspace consolidation remain bilaterally, more on the right than on the left. There are bilateral effusions, larger on the right than on the left, stable. Stable cardiac prominence. Catheter positions as described without pneumothorax. Note that the side port of the chest tube on the right is outside of the pleural space. No pneumothorax evident. Electronically Signed   By: Bretta BangWilliam  Woodruff III M.D.   On: 03/15/2016 07:51    Anti-infectives: Anti-infectives    Start     Dose/Rate Route Frequency Ordered Stop   03/08/16 1400  levofloxacin (LEVAQUIN) IVPB 750 mg     750 mg 100 mL/hr over 90 Minutes Intravenous Every 24 hours 03/08/16 0912 03/10/16 1501   03/06/16 1400  ceFEPIme (MAXIPIME) 2 g in dextrose 5 % 50 mL IVPB  Status:  Discontinued     2 g 100 mL/hr over 30 Minutes Intravenous Every 12 hours 03/06/16 1344 03/08/16 0912   03/01/16 1330  ceFAZolin (ANCEF) IVPB 2g/100 mL premix     2 g 200  mL/hr over 30 Minutes Intravenous To [facility]Stay Surgical 02/29/16 0917 03/01/16 1452   02/11/16 0600  piperacillin-tazobactam (ZOSYN) IVPB 3.375 g  Status:  Discontinued     3.375 g 100 mL/hr over 30 Minutes Intravenous 4 times per day 02/10/16 2214 02/22/16 0853   02/10/16 1730  piperacillin-tazobactam (ZOSYN) IVPB 3.375 g  Status:  Discontinued     3.375 g 12.5 mL/hr over 240 Minutes Intravenous 3 times per day 02/10/16 1727 02/10/16 2214      Assessment/Plan: s/p Procedure(s): ABDOMINAL WOUND CLOSURE PERCUTANEOUS TRACHEOSTOMY Get  speech therapy to re-evaluate for PM valve and swallowing evaluation.  LOS: 36 days   Marta Lamas. Gae Bon, MD, FACS [phone] Trauma Surgeon 03/16/2016

## 2016-03-17 ENCOUNTER — Inpatient Hospital Stay (HOSPITAL_COMMUNITY): Payer: Medicaid Other

## 2016-03-17 LAB — GLUCOSE, CAPILLARY
GLUCOSE-CAPILLARY: 126 mg/dL — AB (ref 65–99)
GLUCOSE-CAPILLARY: 94 mg/dL (ref 65–99)
GLUCOSE-CAPILLARY: 133 mg/dL — AB (ref 65–99)
Glucose-Capillary: 138 mg/dL — ABNORMAL HIGH (ref 65–99)
Glucose-Capillary: 132 mg/dL — ABNORMAL HIGH (ref 65–99)

## 2016-03-17 LAB — BASIC METABOLIC PANEL
Anion gap: 9 (ref 5–15)
BUN: 29 mg/dL — AB (ref 6–20)
CHLORIDE: 111 mmol/L (ref 101–111)
CO2: 28 mmol/L (ref 22–32)
CREATININE: 0.49 mg/dL (ref 0.44–1.00)
Calcium: 8.4 mg/dL — ABNORMAL LOW (ref 8.9–10.3)
GFR calc Af Amer: >60 mL/min (ref >60)
GFR calc non Af Amer: >60 mL/min (ref >60)
GLUCOSE: 134 mg/dL — AB (ref 65–99)
POTASSIUM: 4 mmol/L (ref 3.5–5.1)
SODIUM: 148 mmol/L — AB (ref 135–145)

## 2016-03-17 LAB — CBC WITH DIFFERENTIAL/PLATELET
Basophils Absolute: 0 10*3/uL (ref 0.0–0.1)
Basophils Relative: 0 %
EOS ABS: 3.3 10*3/uL — AB (ref 0.0–0.7)
EOS PCT: 17 %
HCT: 26.8 % — ABNORMAL LOW (ref 36.0–46.0)
HEMOGLOBIN: 8 g/dL — AB (ref 12.0–15.0)
LYMPHS ABS: 1.1 10*3/uL (ref 0.7–4.0)
LYMPHS PCT: 6 %
MCH: 30.9 pg (ref 26.0–34.0)
MCHC: 29.9 g/dL — AB (ref 30.0–36.0)
MCV: 103.5 fL — AB (ref 78.0–100.0)
MONOS PCT: 10 %
Monocytes Absolute: 1.9 10*3/uL — ABNORMAL HIGH (ref 0.1–1.0)
Neutro Abs: 12.7 10*3/uL — ABNORMAL HIGH (ref 1.7–7.7)
Neutrophils Relative %: 67 %
PLATELETS: 214 10*3/uL (ref 150–400)
RBC: 2.59 MIL/uL — AB (ref 3.87–5.11)
RDW: 20.6 % — ABNORMAL HIGH (ref 11.5–15.5)
WBC: 19 10*3/uL — ABNORMAL HIGH (ref 4.0–10.5)

## 2016-03-17 MED ORDER — ENOXAPARIN SODIUM 30 MG/0.3ML ~~LOC~~ SOLN: 30.0000 mg | Freq: Two times a day (BID) | SUBCUTANEOUS | Status: DC

## 2016-03-17 MED ORDER — LORAZEPAM 2 MG/ML IJ SOLN
1.0000 mg | Freq: Once | INTRAMUSCULAR | Status: AC
  Administered 2016-03-17: 1 mg via INTRAVENOUS

## 2016-03-17 MED ORDER — LORAZEPAM 2 MG/ML IJ SOLN
INTRAMUSCULAR | Status: AC
  Administered 2016-03-17: 1 mg via INTRAVENOUS
  Filled 2016-03-17: qty 1

## 2016-03-17 MED ORDER — ENOXAPARIN SODIUM 30 MG/0.3ML ~~LOC~~ SOLN
30.0000 mg | SUBCUTANEOUS | Status: DC
  Administered 2016-03-18 – 2016-03-20 (×3): 30 mg via SUBCUTANEOUS
  Filled 2016-03-17 (×3): qty 0.3

## 2016-03-17 NOTE — Progress Notes (Signed)
Orthopaedic Trauma Service (OTS)   Subjective: Patient reports pain as moderate from pin sites.    Objective: Temp:  [97.3 F (36.3 C)-98.5 F (36.9 C)] 97.3 F (36.3 C) (05/10 0758) Pulse Rate:  [80-96] 80 (05/10 0758) Resp:  [0-40] 28 (05/10 0758) BP: (107-135)/(62-77) 129/70 mmHg (05/10 0700) SpO2:  [93 %-100 %] 99 % (05/10 0758) FiO2 (%):  [35 %-40 %] 35 % (05/10 0758) Weight:  [279 lb 8.7 oz (126.8 kg)] 279 lb 8.7 oz (126.8 kg) (05/10 0500)  Physical Exam Pin sites look quite good for 5 1/2 wks    Assessment/Plan: 16 Days Post-Op Procedure(s) (LRB): ABDOMINAL WOUND CLOSURE (N/A) PERCUTANEOUS TRACHEOSTOMY (N/A) Plan for xrays tomorrow Removal of fixator at bedside on Monday at 6 wks Remains on bed to chair transfers  Myrene GalasMichael Jernee Murtaugh, MD [facility] [phone] (p)

## 2016-03-17 NOTE — Progress Notes (Signed)
Physical Therapy Treatment Patient Details Name: [name] BobKatia D Honaker MRN: [id number] DOB: 04-20-1981 Today's Date: 03/17/2016    History of Present Illness [name] Bell is a 35 y.o. female.She is obese. PMH unknown.Pt attempted suicide: taped DNR note to chest, drank Drano, then jumped off 5 [address] building ([address]S/p 02/09/16 gel foam embolization of bil internal iliac arteries.S/p 02/09/16 ex lap with cholecystectomy, resection SB and mesenteric repair, liver lac repair, application or wound vac.S/p 02/09/16 closed reduction of pelvic ring, external pelvic fixation.Also has right metacarpal fracture in splint. Pt intubated via trach.    PT Comments    Pt was fatigued and lethargic today during session. When aroused, she could follow one step commands given extra time.  She has difficulty initiating to help with mobility, but her LE ROM has stayed pretty good due to the fact that she likes to move her leg over the side of the bed and actively in the bed as well.  PT will continue to follow acutely and hope to mobilize back EOB once external fixator is removed early next week.    Follow Up Recommendations  CIR;Supervision/Assistance - 24 hour     Equipment Recommendations  Wheelchair (measurements PT);Wheelchair cushion (measurements PT);Hospital bed;Other (comment) (hoyer lift)    Recommendations for Other Services Rehab consult     Precautions / Restrictions Precautions Precautions: Sternal;Other (comment) Precaution Comments: pelvic ex fix, multiple abdominal wounds with drains, wound vac, trach Required Braces or Orthoses: Other Brace/Splint Other Brace/Splint: L wrist in splint Restrictions Weight Bearing Restrictions: Yes LUE Weight Bearing: Non weight bearing RLE Weight Bearing: Non weight bearing LLE Weight Bearing: Non weight bearing    Mobility  Bed Mobility Overal bed mobility: +2 for physical assistance;Needs Assistance Bed Mobility: Rolling Rolling: Total  assist;+2 for physical assistance (+3-4 for safety and peri care)         General bed mobility comments: Rolled in bed for peri care,  Pillows between knees for comfort, x-fix peg padded with towels to prevent soft tissue injury.  Pt not initiating to help roll, but once on her side did hold to bed rail.   Transfers                 General transfer comment: awaiting removal of x-fixator to try EOB again.             Cognition Arousal/Alertness: Lethargic Behavior During Therapy: Restless Overall Cognitive Status: Impaired/Different from baseline Area of Impairment: Attention;Following commands;Awareness;Problem solving   Current Attention Level: Focused   Following Commands: Follows one step commands with increased time Safety/Judgement: Decreased awareness of safety;Decreased awareness of deficits Awareness: Intellectual Problem Solving: Decreased initiation;Difficulty sequencing;Requires verbal cues;Requires tactile cues;Slow processing General Comments: Pt following one step commands today when aroused, did not initiate movement to roll, but helped once in sidelying by holding onto railing.  Slow to process at times and needed to be re aroused at times to repeat command.     Exercises General Exercises - Upper Extremity Shoulder Flexion: AAROM;Right;Left;10 reps;Supine Digit Composite Flexion: AAROM;PROM;Left;5 reps;Sidelying Composite Extension: AAROM;Left;5 reps;Supine General Exercises - Lower Extremity Ankle Circles/Pumps: AAROM;PROM;Both;20 reps Heel Slides: PROM;Both;10 reps        Pertinent Vitals/Pain Pain Assessment: Faces Pain Score: 0-No pain Faces Pain Scale: No hurt           PT Goals (current goals can now be found in the care plan section) Acute Rehab PT Goals Patient Stated Goal: unable to state Progress towards PT goals: Progressing  toward goals    Frequency  Min 2X/week    PT Plan Current plan remains appropriate     Co-evaluation PT/OT/SLP Co-Evaluation/Treatment: Yes Reason for Co-Treatment: Necessary to address cognition/behavior during functional activity;Complexity of the patient's impairments (multi-system involvement);For patient/therapist safety PT goals addressed during session: Mobility/safety with mobility;Strengthening/ROM       End of Session Equipment Utilized During Treatment: Oxygen (TC) Activity Tolerance: Patient tolerated treatment well Patient left: in bed;with call bell/phone within reach;with nursing/sitter in room     Time: 1557-1619 PT Time Calculation (min) (ACUTE ONLY): 22 min  Charges:  $Therapeutic Activity: 8-22 mins                     Divon Krabill B. Tyaire Odem, PT, DPT [phone]   03/17/2016, 6:23 PM

## 2016-03-17 NOTE — Progress Notes (Signed)
Chest tube pulled out inadvertently while repositioning patient in bed. Vaseline gauze/gauze placed over chest tube site. Dr. Donata ClayVan Trigt notified - order for chest x-ray placed. Dr, Lindie SpruceWyatt on the floor during event - patient assessed by Dr. Lindie SpruceWyatt. VSS. Will continue to monitor.  - Alwyn RenJamie Kimyata Milich, RN

## 2016-03-17 NOTE — Progress Notes (Signed)
Pt. Was placed back on the vent due to not being able to hold sats and pt. RR continued to elevate. Pt. Also displayed irregular breathing. Pt. Placed back on vent. MD gave permission per RN. Pt. Stable at this time.

## 2016-03-17 NOTE — Progress Notes (Signed)
Pt with copious secretions via trach. RR in the 30's. Lungs coarse with crackles.  Pt received 2 mg Ativan for anxiety and restlessness from day shift nurse and suctioned during shift change report.  Pt continued cycle of secretion production requiring suction; RR in high 30's; desaturation in the mid 80's; anxiety and distress about every 10-15 minutes. Called RT and instructed to increase the Pt to 60% FiO2 from 40% FiO2. Pt continued aforementioned cycle for two additional iterations and demonstrated Bradycardia in the high 30's for approximately 3-5 seconds during one episode.  Paged Trauma and spoke to Dr. Lindie SpruceWyatt. Instructed to place Pt on vent if respiratory distress continued. RT arrived post suction with Pt calm and resting.  During conversation with RT, Pt RR increased to low 40's with copious secretions.  Pt placed back on vent PRVC 30/5/20/490.

## 2016-03-17 NOTE — Progress Notes (Signed)
Occupational Therapy Treatment Patient Details Name: [name] Bell MRN: [id number] DOB: 1981/08/02 Today's Date: 03/17/2016    History of present illness [name] BobKatia D Fryberger is a 35 y.o. female.She is obese. PMH unknown.Pt attempted suicide: taped DNR note to chest, drank Drano, then jumped off 5 [address] building ([address]S/p 02/09/16 gel foam embolization of bil internal iliac arteries.S/p 02/09/16 ex lap with cholecystectomy, resection SB and mesenteric repair, liver lac repair, application or wound vac.S/p 02/09/16 closed reduction of pelvic ring, external pelvic fixation.Also has right metacarpal fracture in splint. Pt intubated via trach.   OT comments  Pt lethargic.  She will follow one step motor commands, with prompting to complete bil. UE AAROM.  She is able to wash face with mod A.  Will continue to follow.   Follow Up Recommendations  LTACH;Supervision/Assistance - 24 hour    Equipment Recommendations  None recommended by OT    Recommendations for Other Services      Precautions / Restrictions Precautions Precautions: Sternal;Other (comment) Precaution Comments: pelvic ex fix, multiple abdominal wounds with drains, wound vac, trach Required Braces or Orthoses: Other Brace/Splint Other Brace/Splint: L wrist in splint Restrictions Weight Bearing Restrictions: Yes LUE Weight Bearing: Non weight bearing RLE Weight Bearing: Non weight bearing LLE Weight Bearing: Non weight bearing       Mobility Bed Mobility Overal bed mobility: +2 for physical assistance;Needs Assistance Bed Mobility: Rolling Rolling: Total assist;+2 for physical assistance (+3 - 4 assist )         General bed mobility comments: Pt rolled Lt and Rt for peri care.  She did hold onto rail with Rt UE when rolled to Lt, but no other attempts to assist with bed mobility.  Did not attempt EOB activity due to external fixator placement impedes her ability to sit upright  Transfers                       Balance                                   ADL Overall ADL's : Needs assistance/impaired     Grooming: Wash/dry face;Moderate assistance;Bed level Grooming Details (indicate cue type and reason): after significant prompting, pt washed face  - she requires mod A for thoroughness                                       Vision                     Perception     Praxis      Cognition                        General Comments: Pt will follow one step motor commands with min cues consistently until she fatigues, then shakes head "no"     Extremity/Trunk Assessment               Exercises General Exercises - Upper Extremity Shoulder Flexion: AAROM;Right;Left;10 reps;Supine Digit Composite Flexion: AAROM;PROM;Left;5 reps;Sidelying Composite Extension: AAROM;Left;5 reps;Supine   Shoulder Instructions       General Comments      Pertinent Vitals/ Pain       Pain Assessment: Faces Faces Pain Scale: No hurt  Home Living  Prior Functioning/Environment              Frequency Min 1X/week     Progress Toward Goals  OT Goals(current goals can now be found in the care plan section)  Progress towards OT goals: Progressing toward goals  ADL Goals Additional ADL Goal #1: pt will follow 1 step command 4 out 5 attempts Additional ADL Goal #2: Pt will tolerate EOB for 31m inutes as precursor to adls Additional ADL Goal #3: Pt will demonstrates focused attention to adl task  Plan Discharge plan remains appropriate    Co-evaluation                 End of Session Equipment Utilized During Treatment: Oxygen   Activity Tolerance Patient limited by lethargy   Patient Left in bed;with call bell/phone within reach;with nursing/sitter in room   Nurse Communication          Time: 9147-8295 OT Time Calculation (min): 23 min  Charges: OT General  Charges $OT Visit: 1 Procedure OT Treatments $Therapeutic Activity: 8-22 mins  Sharhonda Atwood M 03/17/2016, 4:41 PM

## 2016-03-17 NOTE — Progress Notes (Signed)
Trauma Service Note  Subjective: Patient looks great.  Has been off the ventilator for I believe 48 hours.  Seems comfortable.  Objective: Vital signs in last 24 hours: Temp:  [97.3 F (36.3 C)-98.5 F (36.9 C)] 97.3 F (36.3 C) (05/10 0758) Pulse Rate:  [80-96] 80 (05/10 0758) Resp:  [0-40] 28 (05/10 0758) BP: (107-135)/(62-77) 129/70 mmHg (05/10 0700) SpO2:  [93 %-100 %] 99 % (05/10 0758) FiO2 (%):  [35 %-40 %] 35 % (05/10 0758) Weight:  [126.8 kg (279 lb 8.7 oz)] 126.8 kg (279 lb 8.7 oz) (05/10 0500) Last BM Date: 03/14/16  Intake/Output from previous day: 05/09 0701 - 05/10 0700 In: 3335 [I.V.:1200; NG/GT:2075] Out: 3250 [Urine:3250] Intake/Output this shift:    General: No distress.  Likely to have external fixator removed soon  Lungs: Clear.  Chest tube seems to be draining better.  Still lots of right intra-thoracic fluid  Abd: Soft, tolerating tube feedings well.  Extremities: No clinical signs of DVT.  Will get Doppler  Neuro: Intact.  Again, seems globally weak, but moving all fours.  Lab Results: CBC   Recent Labs  03/15/16 0408 03/17/16 0434  WBC 15.4* 19.0*  HGB 7.1* 8.0*  HCT 24.6* 26.8*  PLT 288 214   BMET  Recent Labs  03/15/16 0408 03/17/16 0434  NA 149* 148*  K 3.7 4.0  CL 113* 111  CO2 27 28  GLUCOSE 114* 134*  BUN 45* 29*  CREATININE 0.59 0.49  CALCIUM 7.9* 8.4*   PT/INR No results for input(s): LABPROT, INR in the last 72 hours. ABG No results for input(s): PHART, HCO3 in the last 72 hours.  Invalid input(s): PCO2, PO2  Studies/Results: Dg Chest Port 1 View  03/17/2016  CLINICAL DATA:  Follow-up traumatic right-sided hemo thorax EXAM: PORTABLE CHEST 1 VIEW COMPARISON:  Portable chest x-ray of Mar 15, 2016 FINDINGS: There has developed further increased density in the right hemi thorax consistent with increasing pleural effusion. The proximal port of the right-sided chest tube curled lies outside of the thoracic cavity and may  not be functioning. A small amount of aerated right lung is faintly visible. On the left the interstitial markings throughout the lung have increased somewhat. The left heart border is now largely obscured. The cardiac silhouette remains enlarged. The pulmonary vascularity is indistinct. The tracheostomy appliance tube lies at the level of the clavicular heads. The PICC line tip projects over the midportion of the SVC. IMPRESSION: Worsening appearance of the chest. On the right increased alveolar opacity may reflect further accumulation of pleural fluid. On the left increased interstitial markings suggest interstitial edema that may be of cardiac or noncardiac cause. The proximal port of right-sided chest tube remains outside the thoracic cavity. Correlation with the proper functioning of the chest tube is needed. Electronically Signed   By: David  Swaz[place]and M.D.   On: 03/17/2016 07:53    Anti-infectives: Anti-infectives    Start     Dose/Rate Route Frequency Ordered Stop   03/08/16 1400  levofloxacin (LEVAQUIN) IVPB 750 mg     750 mg 100 mL/hr over 90 Minutes Intravenous Every 24 hours 03/08/16 0912 03/10/16 1501   03/06/16 1400  ceFEPIme (MAXIPIME) 2 g in dextrose 5 % 50 mL IVPB  Status:  Discontinued     2 g 100 mL/hr over 30 Minutes Intravenous Every 12 hours 03/06/16 1344 03/08/16 0912   03/01/16 1330  ceFAZolin (ANCEF) IVPB 2g/100 mL premix     2 g 200 mL/hr over  30 Minutes Intravenous To [facility] 02/29/16 0917 03/01/16 1452   02/11/16 0600  piperacillin-tazobactam (ZOSYN) IVPB 3.375 g  Status:  Discontinued     3.375 g 100 mL/hr over 30 Minutes Intravenous 4 times per day 02/10/16 2214 02/22/16 0853   02/10/16 1730  piperacillin-tazobactam (ZOSYN) IVPB 3.375 g  Status:  Discontinued     3.375 g 12.5 mL/hr over 240 Minutes Intravenous 3 times per day 02/10/16 1727 02/10/16 2214      Assessment/Plan: s/p Procedure(s): ABDOMINAL WOUND CLOSURE PERCUTANEOUS TRACHEOSTOMY Doppler  studies  Transfer to SDU/3S only   LOS: 37 days   Marta LamasJames O. Gae BonWyatt, III, MD, FACS [phone] Trauma Surgeon 03/17/2016

## 2016-03-17 NOTE — Clinical Social Work Note (Signed)
Clinical Social Worker continuing to follow patient and family for support. Patient with a trach, and was able to successfully wean to trach collar with orders to transfer to 3S. Patient sister not currently at bedside. Patient intermittently following commands and working with speech for passy muir valve. CSW remains available for support at this time.  Macario GoldsJesse Solae Norling, Kentucky[place]CSW [phone]

## 2016-03-17 NOTE — Progress Notes (Signed)
Speech Language Pathology Treatment: [name] Speaking valve  Patient Details Name: [name] BobKatia D Bantz MRN: [id number] DOB: June 07, 1981 Today's Date: 03/17/2016 Time: [id number] SLP Time Calculation (min) (ACUTE ONLY): 17 min  Assessment / Plan / Recommendation Clinical Impression  PMSV intervention; sleepier today; keeps eyes closed but responsive to questions and following instructions. Significantly decreased respiratory support with max verbal/tactile stimuli to assist in secretion mobilization. Tolerated up to thirty second trials with valve with decreased anxiety today. Attempts to phonate ineffective with insufficient airy to upper airway and deflated cuff taking up tracheal space. MD, is she able to have cuffless trach (not needed vent since 5/8 at 0700. PMSV with SLP only and continued intervention.    HPI HPI: [name] Bell is a 35 y.o. female.PMH unknown.Pt attempted suicide: taped DNR note to chest, drank Drano, then jumped off 5 story building ([address]S/p 02/09/16 gel foam embolization of bil internal iliac arteries.S/p 02/09/16 ex lap with cholecystectomy, resection SB and mesenteric repair, liver lac repair, application or wound vac.S/p 02/09/16 closed reduction of pelvic ring, external pelvic fixation.Also has right metacarpal fracture in splint. Has #6 cuffed trach and has tolerated trach collar for 24 hours.      SLP Plan  Continue with current plan of care     Recommendations         Patient may use [name] Speech Valve: with SLP only PMSV Supervision: Full MD: Please consider changing trach tube to : Cuffless      Oral Care Recommendations: Oral care QID Follow up Recommendations: LTACH (?) Plan: Continue with current plan of care                     Royce MacadamiaLitaker, Maraya Gwilliam Willis 03/17/2016, 11:21 AM  Breck CoonsLisa Willis Lonell FaceLitaker M.Ed [employer]CCC-SLP Pager [phone]

## 2016-03-18 ENCOUNTER — Inpatient Hospital Stay (HOSPITAL_COMMUNITY): Payer: Medicaid Other

## 2016-03-18 DIAGNOSIS — S329XXA Fracture of unspecified parts of lumbosacral spine and pelvis, initial encounter for closed fracture: Secondary | ICD-10-CM

## 2016-03-18 LAB — COMPREHENSIVE METABOLIC PANEL
ALT: 33 U/L (ref 14–54)
AST: 37 U/L (ref 15–41)
Albumin: 1.4 g/dL — ABNORMAL LOW (ref 3.5–5.0)
Alkaline Phosphatase: 137 U/L — ABNORMAL HIGH (ref 38–126)
Anion gap: 8 (ref 5–15)
BUN: 24 mg/dL — ABNORMAL HIGH (ref 6–20)
CO2: 30 mmol/L (ref 22–32)
Calcium: 8.2 mg/dL — ABNORMAL LOW (ref 8.9–10.3)
Chloride: 107 mmol/L (ref 101–111)
Creatinine, Ser: 0.54 mg/dL (ref 0.44–1.00)
GFR calc Af Amer: >60 mL/min (ref >60)
GFR calc non Af Amer: >60 mL/min (ref >60)
Glucose, Bld: 106 mg/dL — ABNORMAL HIGH (ref 65–99)
Potassium: 3.9 mmol/L (ref 3.5–5.1)
Sodium: 145 mmol/L (ref 135–145)
Total Bilirubin: 3.1 mg/dL — ABNORMAL HIGH (ref 0.3–1.2)
Total Protein: 5.8 g/dL — ABNORMAL LOW (ref 6.5–8.1)

## 2016-03-18 LAB — GLUCOSE, CAPILLARY
GLUCOSE-CAPILLARY: 93 mg/dL (ref 65–99)
GLUCOSE-CAPILLARY: 130 mg/dL — AB (ref 65–99)
GLUCOSE-CAPILLARY: 145 mg/dL — AB (ref 65–99)
Glucose-Capillary: 159 mg/dL — ABNORMAL HIGH (ref 65–99)
Glucose-Capillary: 130 mg/dL — ABNORMAL HIGH (ref 65–99)
Glucose-Capillary: 126 mg/dL — ABNORMAL HIGH (ref 65–99)
Glucose-Capillary: 147 mg/dL — ABNORMAL HIGH (ref 65–99)

## 2016-03-18 LAB — CBC
HCT: 23.1 % — ABNORMAL LOW (ref 36.0–46.0)
Hemoglobin: 7 g/dL — ABNORMAL LOW (ref 12.0–15.0)
MCH: 30.8 pg (ref 26.0–34.0)
MCHC: 30.3 g/dL (ref 30.0–36.0)
MCV: 101.8 fL — ABNORMAL HIGH (ref 78.0–100.0)
Platelets: 179 10*3/uL (ref 150–400)
RBC: 2.27 MIL/uL — ABNORMAL LOW (ref 3.87–5.11)
RDW: 19.8 % — ABNORMAL HIGH (ref 11.5–15.5)
WBC: 16.9 10*3/uL — ABNORMAL HIGH (ref 4.0–10.5)

## 2016-03-18 MED ORDER — GUAIFENESIN 100 MG/5ML PO SOLN
15.0000 mL | Freq: Four times a day (QID) | ORAL | Status: DC
  Administered 2016-03-18 – 2016-03-31 (×49): 300 mg
  Administered 2016-04-01: 15 mL
  Administered 2016-04-01 – 2016-04-11 (×27): 300 mg
  Administered 2016-04-11: 100 mg
  Filled 2016-03-18: qty 15
  Filled 2016-03-18: qty 5
  Filled 2016-03-18: qty 15
  Filled 2016-03-18: qty 5
  Filled 2016-03-18: qty 15
  Filled 2016-03-18: qty 5
  Filled 2016-03-18 (×6): qty 15
  Filled 2016-03-18: qty 10
  Filled 2016-03-18: qty 5
  Filled 2016-03-18: qty 15
  Filled 2016-03-18: qty 5
  Filled 2016-03-18: qty 15
  Filled 2016-03-18: qty 25
  Filled 2016-03-18 (×3): qty 15
  Filled 2016-03-18 (×2): qty 5
  Filled 2016-03-18 (×3): qty 15
  Filled 2016-03-18: qty 5
  Filled 2016-03-18 (×5): qty 15
  Filled 2016-03-18: qty 5
  Filled 2016-03-18 (×2): qty 15
  Filled 2016-03-18: qty 10
  Filled 2016-03-18 (×2): qty 15
  Filled 2016-03-18: qty 5
  Filled 2016-03-18: qty 15
  Filled 2016-03-18: qty 5
  Filled 2016-03-18: qty 15
  Filled 2016-03-18: qty 5
  Filled 2016-03-18 (×2): qty 15
  Filled 2016-03-18: qty 5
  Filled 2016-03-18: qty 15
  Filled 2016-03-18: qty 25
  Filled 2016-03-18: qty 15
  Filled 2016-03-18: qty 5
  Filled 2016-03-18 (×2): qty 15
  Filled 2016-03-18: qty 5
  Filled 2016-03-18: qty 10
  Filled 2016-03-18 (×3): qty 15
  Filled 2016-03-18 (×2): qty 5
  Filled 2016-03-18: qty 15
  Filled 2016-03-18 (×2): qty 5
  Filled 2016-03-18 (×6): qty 15
  Filled 2016-03-18 (×3): qty 5
  Filled 2016-03-18: qty 15
  Filled 2016-03-18: qty 5
  Filled 2016-03-18 (×4): qty 15
  Filled 2016-03-18: qty 5
  Filled 2016-03-18: qty 15
  Filled 2016-03-18: qty 5
  Filled 2016-03-18 (×3): qty 15
  Filled 2016-03-18: qty 5
  Filled 2016-03-18: qty 15

## 2016-03-18 NOTE — Progress Notes (Signed)
VASCULAR LAB PRELIMINARY  PRELIMINARY  PRELIMINARY  PRELIMINARY  Bilateral lower extremity venous duplex completed.     Bilateral:  No evidence of DVT, superficial thrombosis, or Baker's Cyst.  Gave patient's nurse the results  Jenetta Logesami Alok Minshall, RVT, RDMS 03/18/2016, 2:57 PM

## 2016-03-18 NOTE — Consult Note (Signed)
[place] Psychiatry Consult   Reason for Consult:  Suicide attempt Referring Physician:  Trauma MD Patient Identification: [name] Bell MRN:  [id number] Principal Diagnosis: Suicide attempt [facility]) Diagnosis:   Patient Active Problem List   Diagnosis Date Noted  . Multiple pelvic fractures ([place]) [S32.810A] 03/03/2016  . Left wrist fracture [S62.102A] 03/03/2016  . Multiple fractures of ribs of right side [S22.41XA] 03/03/2016  . Fracture of multiple ribs of left side [S22.42XA] 03/03/2016  . Liver laceration [S36.113A] 03/03/2016  . Small intestine injury [S36.409A] 03/03/2016  . Acute blood loss anemia [D62] 03/03/2016  . Suicide attempt ([place]) [T14.91] 03/03/2016  . Bilateral pulmonary contusion [S27.322A] 03/03/2016  . Acute respiratory failure ([place]) [J96.00] 03/03/2016  . Hypokalemia [E87.6] 03/03/2016  . Hypernatremia [E87.0] 03/03/2016  . Acute kidney injury ([place]) [N17.9] 03/03/2016  . Ingestion of caustic substance [T54.91XA] 03/03/2016  . Tylenol overdose [T39.1X4A] 03/03/2016  . Hyperglycemia [R73.9] 03/03/2016  . Hepatic failure ([place]) [K72.90] 03/03/2016  . Pressure ulcer [L89.90] 02/26/2016  . Fall from, out of or through building, not otherwise specified, initial encounter [W13.9XXA] 02/09/2016  . Hypovolemic shock ([place]) [R57.1] 02/09/2016    Total Time spent with patient: 30 minutes  Subjective:   [name] Bell is a 35 y.o. female patient admitted with status post suicidal attempt.  HPI:  [name] Bell is a 35 years old female admitted to [facility] Since 02/09/2016,status post suicidal attempt, jumped from her 5th floor hotel room. Patient required multiple surgical interventions regarding pelvic fractures, left wrist fracture multiple fractures of relapse of right side and left side and also has a liver laceration and small intestine injury And hypovolemic's shock Patient required intubation in and out several times due to difficulty to weaning off  at this time. Reportedly patient sister has been supportive and in and out into the hospital Patient sister believes she continued to feel suicidal ideation. Patient is under sedation secondary to medication during my evaluation and intermittently able to stay awake briefly and nodding her head for the questions Patient endorses being depressed but denies being currently suicidal. Staff RN reported patient had nodding his seems to be more appropriate to the questions asking her. We will try to reach patient's sister to get more psychosocial stressors and also reevaluate the patient when she becomes more  communicative without sedation  Past Psychiatric History: Review of medical records indicated patient has no previous psychiatric admission health Center.  Risk to Self: Is patient at risk for suicide?: Yes Risk to Others:   Prior Inpatient Therapy:   Prior Outpatient Therapy:    Past Medical History: History reviewed. No pertinent past medical history.  Past Surgical History  Procedure Laterality Date  . Esophagogastroduodenoscopy N/A 02/10/2016    Procedure: ESOPHAGOGASTRODUODENOSCOPY (EGD);  Surgeon: Doran Stabler, MD;  Location: [facility] ENDOSCOPY;  Service: Endoscopy;  Laterality: N/A;  . Laparotomy N/A 02/10/2016    Procedure: EXPLORATORY LAPAROTOMY, removal of packs,  cauterization of liver, repacking of liver, and open abdomen vac application;  Surgeon: Georganna Skeans, MD;  Location: [place];  Service: General;  Laterality: N/A;  . Laparotomy N/A 02/11/2016    Procedure: EXPLORATORY LAPAROTOMY VAC CHANGE ;  Surgeon: Judeth Horn, MD;  Location: [place];  Service: General;  Laterality: N/A;  . Chest tube insertion Right 02/11/2016    Procedure: CHEST TUBE INSERTION;  Surgeon: Judeth Horn, MD;  Location: [place];  Service: General;  Laterality: Right;  . Laparotomy N/A 02/13/2016    Procedure: EXPLORATORY  LAPAROTOMY, REMOVAL OF PACKS, ABDOMINAL VAC DRESSING CHANGE;  Surgeon: Georganna Skeans, MD;  Location: [place];  Service: General;  Laterality: N/A;  . Laparotomy N/A 02/18/2016    Procedure: EXPLORATORY LAPAROTOMY, PLACEMENT OF ABRA ABDOMINAL WALL CLOSURE SET;  Surgeon: Judeth Horn, MD;  Location: [place];  Service: General;  Laterality: N/A;  . Chest tube insertion Right 02/26/2016    Procedure: CHEST TUBE INSERTION;  Surgeon: Ivin Poot, MD;  Location: [place];  Service: Thoracic;  Laterality: Right;  . Wound debridement N/A 03/01/2016    Procedure: ABDOMINAL WOUND CLOSURE;  Surgeon: Judeth Horn, MD;  Location: [place];  Service: General;  Laterality: N/A;  . Percutaneous tracheostomy N/A 03/01/2016    Procedure: PERCUTANEOUS TRACHEOSTOMY;  Surgeon: Judeth Horn, MD;  Location: [place];  Service: General;  Laterality: N/A;  . Laparotomy N/A 02/09/2016    Procedure: EXPLORATORY LAPAROTOMY;  Surgeon: Judeth Horn, MD;  Location: [place];  Service: General;  Laterality: N/A;  . Cholecystectomy  02/09/2016    Procedure: CHOLECYSTECTOMY;  Surgeon: Judeth Horn, MD;  Location: [place];  Service: General;;  . Bowel resection  02/09/2016    Procedure: SMALL BOWEL RESECTION, MESENTERIC REPAIR;  Surgeon: Judeth Horn, MD;  Location: [place];  Service: General;;  . Application of wound vac  02/09/2016    Procedure: APPLICATION OF WOUND VAC;  Surgeon: Judeth Horn, MD;  Location: [place];  Service: General;;  . Laceration repair  02/09/2016    Procedure: REPAIR LIVER LACERATION;  Surgeon: Judeth Horn, MD;  Location: [place];  Service: General;;  . External fixation pelvis  02/09/2016    Procedure: EXTERNAL FIXATION PELVIS;  Surgeon: Altamese T[place], MD;  Location: [place];  Service: Orthopedics;;  . Laparotomy N/A 02/16/2016    Procedure: EXPLORATORY LAPAROTOMY, ABDOMINAL [place];  Surgeon: Georganna Skeans, MD;  Location: [place];  Service: General;  Laterality: N/A;  . Application of wound vac N/A 02/16/2016    Procedure: RE-APPLICATION OF WOUND VAC;  Surgeon: Georganna Skeans, MD;  Location: [place];  Service: General;  Laterality: N/A;  .  Sacro-iliac pinning Right 02/16/2016    Procedure: [place];  Surgeon: Altamese [place], MD;  Location: [place];  Service: Orthopedics;  Laterality: Right;   Family History: History reviewed. No pertinent family history. Family Psychiatric  History: Unknown Social History:  History  Alcohol Use: Not on file    Comment: unknown     History  Drug Use Not on file    Social History   Social History  . Marital Status: Unknown    Spouse Name: N/A  . Number of Children: N/A  . Years of Education: N/A   Social History Main Topics  . Smoking status: Unknown If Ever Smoked  . Smokeless tobacco: None  . Alcohol Use: None     Comment: unknown  . Drug Use: None  . Sexual Activity: Not Asked   Other Topics Concern  . None   Social History Narrative   Additional Social History:    Allergies:  No Known Allergies  Labs:  Results for orders placed or performed during the hospital encounter of 02/09/16 (from the past 48 hour(s))  Glucose, capillary     Status: Abnormal   Collection Time: 03/16/16  3:38 PM  Result Value Ref Range   Glucose-Capillary 138 (H) 65 - 99 mg/dL  Glucose, capillary     Status: Abnormal   Collection Time: 03/16/16  7:56 PM  Result Value Ref Range   Glucose-Capillary  118 (H) 65 - 99 mg/dL  Glucose, capillary     Status: Abnormal   Collection Time: 03/16/16 11:17 PM  Result Value Ref Range   Glucose-Capillary 128 (H) 65 - 99 mg/dL  Glucose, capillary     Status: Abnormal   Collection Time: 03/17/16  3:38 AM  Result Value Ref Range   Glucose-Capillary 126 (H) 65 - 99 mg/dL  CBC with Differential/Platelet     Status: Abnormal   Collection Time: 03/17/16  4:34 AM  Result Value Ref Range   WBC 19.0 (H) 4.0 - 10.5 K/uL   RBC 2.59 (L) 3.87 - 5.11 MIL/uL   Hemoglobin 8.0 (L) 12.0 - 15.0 g/dL   HCT 26.8 (L) 36.0 - 46.0 %   MCV 103.5 (H) 78.0 - 100.0 fL   MCH 30.9 26.0 - 34.0 pg   MCHC 29.9 (L) 30.0 - 36.0 g/dL   RDW 20.6 (H) 11.5 - 15.5 %    Platelets 214 150 - 400 K/uL   Neutrophils Relative % 67 %   Neutro Abs 12.7 (H) 1.7 - 7.7 K/uL   Lymphocytes Relative 6 %   Lymphs Abs 1.1 0.7 - 4.0 K/uL   Monocytes Relative 10 %   Monocytes Absolute 1.9 (H) 0.1 - 1.0 K/uL   Eosinophils Relative 17 %   Eosinophils Absolute 3.3 (H) 0.0 - 0.7 K/uL   Basophils Relative 0 %   Basophils Absolute 0.0 0.0 - 0.1 K/uL  Basic metabolic panel     Status: Abnormal   Collection Time: 03/17/16  4:34 AM  Result Value Ref Range   Sodium 148 (H) 135 - 145 mmol/L   Potassium 4.0 3.5 - 5.1 mmol/L   Chloride 111 101 - 111 mmol/L   CO2 28 22 - 32 mmol/L   Glucose, Bld 134 (H) 65 - 99 mg/dL   BUN 29 (H) 6 - 20 mg/dL   Creatinine, Ser 0.49 0.44 - 1.00 mg/dL   Calcium 8.4 (L) 8.9 - 10.3 mg/dL   GFR calc non Af Amer >60 >60 mL/min   GFR calc Af Amer >60 >60 mL/min    Comment: (NOTE) The eGFR has been calculated using the CKD EPI equation. This calculation has not been validated in all clinical situations. eGFR's persistently <60 mL/min signify possible Chronic Kidney Disease.    Anion gap 9 5 - 15  Glucose, capillary     Status: Abnormal   Collection Time: 03/17/16  7:51 AM  Result Value Ref Range   Glucose-Capillary 138 (H) 65 - 99 mg/dL  Glucose, capillary     Status: None   Collection Time: 03/17/16 11:15 AM  Result Value Ref Range   Glucose-Capillary 94 65 - 99 mg/dL   Comment 1 Notify RN    Comment 2 Document in Chart   Culture, respiratory (NON-Expectorated)     Status: None (Preliminary result)   Collection Time: 03/17/16 11:32 AM  Result Value Ref Range   Specimen Description TRACHEAL ASPIRATE    Special Requests Normal    Gram Stain      FEW WBC PRESENT,BOTH PMN AND MONONUCLEAR RARE SQUAMOUS EPITHELIAL CELLS PRESENT NO ORGANISMS SEEN Performed at [facility]    Culture      NO GROWTH 1 DAY Performed at [facility]    Report Status PENDING   Glucose, capillary     Status: Abnormal   Collection Time:  03/17/16  5:08 PM  Result Value Ref Range   Glucose-Capillary 133 (H) 65 - 99  mg/dL   Comment 1 Notify RN    Comment 2 Document in Chart   Glucose, capillary     Status: Abnormal   Collection Time: 03/17/16  7:21 PM  Result Value Ref Range   Glucose-Capillary 132 (H) 65 - 99 mg/dL  Glucose, capillary     Status: Abnormal   Collection Time: 03/17/16 11:21 PM  Result Value Ref Range   Glucose-Capillary 159 (H) 65 - 99 mg/dL  Glucose, capillary     Status: None   Collection Time: 03/18/16  3:19 AM  Result Value Ref Range   Glucose-Capillary 93 65 - 99 mg/dL  CBC     Status: Abnormal   Collection Time: 03/18/16  4:23 AM  Result Value Ref Range   WBC 16.9 (H) 4.0 - 10.5 K/uL   RBC 2.27 (L) 3.87 - 5.11 MIL/uL   Hemoglobin 7.0 (L) 12.0 - 15.0 g/dL   HCT 23.1 (L) 36.0 - 46.0 %   MCV 101.8 (H) 78.0 - 100.0 fL   MCH 30.8 26.0 - 34.0 pg   MCHC 30.3 30.0 - 36.0 g/dL   RDW 19.8 (H) 11.5 - 15.5 %   Platelets 179 150 - 400 K/uL  Comprehensive metabolic panel     Status: Abnormal   Collection Time: 03/18/16  4:23 AM  Result Value Ref Range   Sodium 145 135 - 145 mmol/L   Potassium 3.9 3.5 - 5.1 mmol/L   Chloride 107 101 - 111 mmol/L   CO2 30 22 - 32 mmol/L   Glucose, Bld 106 (H) 65 - 99 mg/dL   BUN 24 (H) 6 - 20 mg/dL   Creatinine, Ser 0.54 0.44 - 1.00 mg/dL   Calcium 8.2 (L) 8.9 - 10.3 mg/dL   Total Protein 5.8 (L) 6.5 - 8.1 g/dL   Albumin 1.4 (L) 3.5 - 5.0 g/dL   AST 37 15 - 41 U/L   ALT 33 14 - 54 U/L   Alkaline Phosphatase 137 (H) 38 - 126 U/L   Total Bilirubin 3.1 (H) 0.3 - 1.2 mg/dL   GFR calc non Af Amer >60 >60 mL/min   GFR calc Af Amer >60 >60 mL/min    Comment: (NOTE) The eGFR has been calculated using the CKD EPI equation. This calculation has not been validated in all clinical situations. eGFR's persistently <60 mL/min signify possible Chronic Kidney Disease.    Anion gap 8 5 - 15  Glucose, capillary     Status: Abnormal   Collection Time: 03/18/16  7:47 AM   Result Value Ref Range   Glucose-Capillary 130 (H) 65 - 99 mg/dL  Glucose, capillary     Status: Abnormal   Collection Time: 03/18/16 11:25 AM  Result Value Ref Range   Glucose-Capillary 130 (H) 65 - 99 mg/dL    Current Facility-Administered Medications  Medication Dose Route Frequency Provider Last Rate Last Dose  . antiseptic oral rinse solution (CORINZ)  7 mL Mouth Rinse 10 times per day Ralene Ok, MD   7 mL at 03/18/16 0516  . bisacodyl (DULCOLAX) suppository 10 mg  10 mg Rectal Daily PRN Judeth Horn, MD      . chlorhexidine gluconate (SAGE KIT) (PERIDEX) 0.12 % solution 15 mL  15 mL Mouth Rinse BID Ralene Ok, MD   15 mL at 03/18/16 0812  . clonazePAM (KLONOPIN) tablet 1 mg  1 mg Per Tube BID Eudelia Bunch, RPH   1 mg at 03/18/16 1021  . dextrose 5 % with KCl 20 mEq /  L  infusion  20 mEq Intravenous Continuous Judeth Horn, MD 50 mL/hr at 03/18/16 0228 20 mEq at 03/18/16 0228  . enoxaparin (LOVENOX) injection 30 mg  30 mg Subcutaneous Q24H Ivin Poot, MD   30 mg at 03/18/16 1020  . escitalopram (LEXAPRO) tablet 10 mg  10 mg Per Tube Daily Lisette Abu, PA-C   10 mg at 03/18/16 1020  . feeding supplement (PIVOT 1.5 CAL) liquid 1,000 mL  1,000 mL Per Tube Continuous Judeth Horn, MD 60 mL/hr at 03/18/16 0519 1,000 mL at 03/18/16 0519  . feeding supplement (PRO-STAT SUGAR FREE 64) liquid 30 mL  30 mL Per Tube Daily Judeth Horn, MD   30 mL at 03/18/16 1018  . fentaNYL (DURAGESIC - dosed mcg/hr) 100 mcg  100 mcg Transdermal Q72H Lisette Abu, PA-C   100 mcg at 03/17/16 0855  . free water 400 mL  400 mL Per Tube Q6H Judeth Horn, MD   400 mL at 03/18/16 0812  . guaiFENesin (ROBITUSSIN) 100 MG/5ML solution 300 mg  15 mL Per Tube Q6H Georganna Skeans, MD      . HYDROmorphone (DILAUDID) injection 1-2 mg  1-2 mg Intravenous Q4H PRN Judeth Horn, MD   2 mg at 03/18/16 1028  . insulin aspart (novoLOG) injection 0-20 Units  0-20 Units Subcutaneous 6 times per day Georganna Skeans, MD   3 Units at 03/18/16 [phone]  . ipratropium-albuterol (DUONEB) 0.5-2.5 (3) MG/3ML nebulizer solution 3 mL  3 mL Nebulization Q6H PRN Judeth Horn, MD   3 mL at 03/01/16 0350  . ondansetron (ZOFRAN) tablet 4 mg  4 mg Oral Q6H PRN Lisette Abu, PA-C       Or  . ondansetron [facility]) injection 4 mg  4 mg Intravenous Q6H PRN Lisette Abu, PA-C   4 mg at 02/12/16 2245  . oxyCODONE (ROXICODONE) 5 MG/5ML solution 10 mg  10 mg Per Tube Q4H PRN Judeth Horn, MD   10 mg at 03/17/16 1344  . pantoprazole (PROTONIX) EC tablet 40 mg  40 mg Oral Daily Lisette Abu, PA-C   40 mg at 03/12/16 0940   Or  . pantoprazole (PROTONIX) injection 40 mg  40 mg Intravenous Daily Lisette Abu, PA-C   40 mg at 03/18/16 1019  . QUEtiapine (SEROQUEL) tablet 50 mg  50 mg Per Tube TID Judeth Horn, MD   50 mg at 03/18/16 1019  . sodium chloride flush (NS) 0.9 % injection 10-40 mL  10-40 mL Intracatheter Q12H Georganna Skeans, MD   10 mL at 03/18/16 1020  . sodium chloride flush (NS) 0.9 % injection 10-40 mL  10-40 mL Intracatheter PRN Georganna Skeans, MD   10 mL at 03/11/16 0930    Musculoskeletal: Strength & Muscle Tone: decreased Gait & Station: unable to stand Patient leans: N/A  Psychiatric Specialty Exam: Review of Systems  Unable to perform ROS   Blood pressure 115/67, pulse 77, temperature 99 F (37.2 C), temperature source Axillary, resp. rate 21, height 5' 11"  (1.803 m), weight 130.2 kg (287 lb 0.6 oz), SpO2 97 %.Body mass index is 40.05 kg/(m^2).  General Appearance: Guarded  Eye Contact::  Minimal  Speech:  NA and communicate briefly nodding her head.  Volume:  not applicable  Mood:  Depressed  Affect:  Depressed and Flat  Thought Process:  Coherent  Orientation:  Negative  Thought Content:  Rumination  Suicidal Thoughts:  Yes.  with intent/plan  Homicidal Thoughts:  No  Memory:  Immediate;   Fair Recent;   Poor  Judgement:  Impaired  Insight:  NA  Psychomotor Activity:   Psychomotor Retardation and Restlessness  Concentration:  Fair  Recall:  Poor  Fund of Knowledge:Fair  Language: Fair  Akathisia:  Negative  Handed:  Right  AIMS (if indicated):     Assets:  Others:  deferred  ADL's:  Impaired  Cognition: Impaired,  Mild  Sleep:      Treatment Plan Summary: Daily contact with patient to assess and evaluate symptoms and progress in treatment and Medication management  Recommend to continue safety precautions and safety sitter Psychiatric consultation will follow up with patient for the further evaluation when medically stable and able to communicate effectively for the Psychiatric Assessment. Refer to the psychiatric social service to contact patient family members for Collateral information  Disposition: Supportive therapy provided about ongoing stressors.  Durward Parcel., MD 03/18/2016 12:08 PM

## 2016-03-18 NOTE — Progress Notes (Signed)
      [address]       [place], [id number]             [phone]      17 Days Post-Op Procedure(s) (LRB): ABDOMINAL WOUND CLOSURE (N/A) PERCUTANEOUS TRACHEOSTOMY (N/A)   Subjective:  [name] Bell is awake and alert this morning.  Had to be placed back on vent overnight due to dropping sats and increase secretions.  Her chest tube was pulled out yesterday while attempting to roll patient.  Objective: Vital signs in last 24 hours: Temp:  [98 F (36.7 C)-99 F (37.2 C)] 99 F (37.2 C) (05/11 0750) Pulse Rate:  [74-107] 91 (05/11 0700) Cardiac Rhythm:  [-] Normal sinus rhythm (05/11 0400) Resp:  [0-35] 21 (05/11 0700) BP: (96-133)/(45-84) 103/53 mmHg (05/11 0600) SpO2:  [82 %-100 %] 100 % (05/11 0700) FiO2 (%):  [28 %-60 %] 30 % (05/11 0400) Weight:  [287 lb 0.6 oz (130.2 kg)] 287 lb 0.6 oz (130.2 kg) (05/11 0409)  Intake/Output from previous day: 05/10 0701 - 05/11 0700 In: 3440 [I.V.:1200; NG/GT:2240] Out: 2450 [Urine:2450]  General appearance: alert, cooperative and no distress Heart: regular rate and rhythm Lungs: diminished breath sounds on right Wound: clean and dry  Lab Results:  Recent Labs  03/17/16 0434 03/18/16 0423  WBC 19.0* 16.9*  HGB 8.0* 7.0*  HCT 26.8* 23.1*  PLT 214 179   BMET:  Recent Labs  03/17/16 0434 03/18/16 0423  NA 148* 145  K 4.0 3.9  CL 111 107  CO2 28 30  GLUCOSE 134* 106*  BUN 29* 24*  CREATININE 0.49 0.54  CALCIUM 8.4* 8.2*    PT/INR: No results for input(s): LABPROT, INR in the last 72 hours. ABG    Component Value Date/Time   PHART 7.508* 02/28/2016 0642   HCO3 25.5* 02/28/2016 0642   TCO2 26.5 02/28/2016 0642   ACIDBASEDEF 2.0 02/13/2016 0411   O2SAT 99.2 02/28/2016 0642   CBG (last 3)   Recent Labs  03/17/16 2321 03/18/16 0319 03/18/16 0747  GLUCAP 159* 93 130*    Assessment/Plan: S/P Procedure(s) (LRB): ABDOMINAL WOUND CLOSURE (N/A) PERCUTANEOUS TRACHEOSTOMY (N/A)  1. Large  Right Sided Hemothorax- chest tube was accidentally pulled out yesterday..CXR is free from pneumothorax 2. Pulm- patient had been weaned off vent, tolerated for 48 hours, however had to be placed back on vent overnight. 3. Dispo- care per trauma   LOS: 38 days    [name] Bell 03/18/2016

## 2016-03-18 NOTE — Progress Notes (Signed)
Attempting ATC, pt appears to be more comfortable and less agitated presently.  RN aware, pt tol well currently.
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
## 2016-03-18 NOTE — Progress Notes (Signed)
Attempted to wean vent this morning, pt had a brief bradycardic episode HR down to the 50's, RR 40's.  RN aware. Pt placed back on full vent support.

## 2016-03-19 LAB — GLUCOSE, CAPILLARY
GLUCOSE-CAPILLARY: 131 mg/dL — AB (ref 65–99)
GLUCOSE-CAPILLARY: 108 mg/dL — AB (ref 65–99)
GLUCOSE-CAPILLARY: 132 mg/dL — AB (ref 65–99)
GLUCOSE-CAPILLARY: 120 mg/dL — AB (ref 65–99)
Glucose-Capillary: 131 mg/dL — ABNORMAL HIGH (ref 65–99)

## 2016-03-19 LAB — BASIC METABOLIC PANEL
ANION GAP: 8 (ref 5–15)
BUN: 23 mg/dL — ABNORMAL HIGH (ref 6–20)
CALCIUM: 8.4 mg/dL — AB (ref 8.9–10.3)
CO2: 29 mmol/L (ref 22–32)
Chloride: 104 mmol/L (ref 101–111)
Creatinine, Ser: 0.47 mg/dL (ref 0.44–1.00)
GFR calc Af Amer: >60 mL/min (ref >60)
Glucose, Bld: 146 mg/dL — ABNORMAL HIGH (ref 65–99)
POTASSIUM: 4.2 mmol/L (ref 3.5–5.1)
SODIUM: 141 mmol/L (ref 135–145)

## 2016-03-19 LAB — CBC
HCT: 25.8 % — ABNORMAL LOW (ref 36.0–46.0)
Hemoglobin: 7.6 g/dL — ABNORMAL LOW (ref 12.0–15.0)
MCH: 30.6 pg (ref 26.0–34.0)
MCHC: 29.5 g/dL — ABNORMAL LOW (ref 30.0–36.0)
MCV: 104 fL — ABNORMAL HIGH (ref 78.0–100.0)
PLATELETS: 177 10*3/uL (ref 150–400)
RBC: 2.48 MIL/uL — AB (ref 3.87–5.11)
RDW: 19.4 % — AB (ref 11.5–15.5)
WBC: 18.9 10*3/uL — AB (ref 4.0–10.5)

## 2016-03-19 LAB — CULTURE, RESPIRATORY W GRAM STAIN
Culture: NORMAL
Special Requests: NORMAL

## 2016-03-19 MED ORDER — FERROUS SULFATE 220 (44 FE) MG/5ML PO ELIX: 220.0000 mg | ORAL_SOLUTION | Freq: Two times a day (BID) | ORAL | Status: DC

## 2016-03-19 MED ORDER — FENTANYL 50 MCG/HR TD PT72
75.0000 ug | MEDICATED_PATCH | TRANSDERMAL | Status: DC
  Administered 2016-03-20 – 2016-03-23 (×2): 75 ug via TRANSDERMAL
  Filled 2016-03-19 (×3): qty 1

## 2016-03-19 MED ORDER — FERROUS SULFATE 300 (60 FE) MG/5ML PO SYRP
300.0000 mg | ORAL_SOLUTION | Freq: Two times a day (BID) | ORAL | Status: DC
  Administered 2016-03-19 – 2016-04-11 (×45): 300 mg
  Filled 2016-03-19 (×51): qty 5

## 2016-03-19 NOTE — Progress Notes (Signed)
CHART REVIEWED AT CURRENT TIME STILL DO NOT RECOMMEND SURGERY HER FRACTURES CAN BE TREATED IN CLOSED MANNER WOULD NEED TO SEE ME IN OFFICE SHE CAN STAY IN [facility] WHILE IN HOSPITAL OK TO PUT WEIGHT ON WRIST IN [place] PLEASE CONTACT IF ANY QUESTIONS

## 2016-03-19 NOTE — Progress Notes (Signed)
18 Days Post-Op Procedure(s) (LRB): ABDOMINAL WOUND CLOSURE (N/A) PERCUTANEOUS TRACHEOSTOMY (N/A) Subjective: Patient examined, chest tube site inspected and CXR reviewed Breath sounds slightly diminished on R off Vent Chest tube site clean, granulating CXR with mil-mod effusion/pleural thickening unchanged Tracheal aspirate no growth  Objective: Vital signs in last 24 hours: Temp:  [97.5 F (36.4 C)-98.8 F (37.1 C)] 98.5 F (36.9 C) (05/12 1143) Pulse Rate:  [70-103] 91 (05/12 1143) Cardiac Rhythm:  [-] Normal sinus rhythm (05/12 0800) Resp:  [0-35] 22 (05/12 1143) BP: (94-134)/(50-81) 115/61 mmHg (05/12 1100) SpO2:  [91 %-100 %] 98 % (05/12 1143) FiO2 (%):  [40 %] 40 % (05/12 1143) Weight:  [293 lb 10.4 oz (133.2 kg)] 293 lb 10.4 oz (133.2 kg) (05/12 0358)  Hemodynamic parameters for last 24 hours:  O2 sats ok on trach ciollar  Intake/Output from previous day: 05/11 0701 - 05/12 0700 In: 3170 [I.V.:1210; NG/GT:1840] Out: 2001 [Urine:2000; Stool:1] Intake/Output this shift: Total I/O In: 840 [I.V.:200; Other:400; NG/GT:240] Out: -   Wound: clean R chest  Lab Results:  Recent Labs  03/18/16 0423 03/19/16 0408  WBC 16.9* 18.9*  HGB 7.0* 7.6*  HCT 23.1* 25.8*  PLT 179 177   BMET:  Recent Labs  03/18/16 0423 03/19/16 0408  NA 145 141  K 3.9 4.2  CL 107 104  CO2 30 29  GLUCOSE 106* 146*  BUN 24* 23*  CREATININE 0.54 0.47  CALCIUM 8.2* 8.4*    PT/INR: No results for input(s): LABPROT, INR in the last 72 hours. ABG    Component Value Date/Time   PHART 7.508* 02/28/2016 0642   HCO3 25.5* 02/28/2016 0642   TCO2 26.5 02/28/2016 0642   ACIDBASEDEF 2.0 02/13/2016 0411   O2SAT 99.2 02/28/2016 0642   CBG (last 3)   Recent Labs  03/19/16 0339 03/19/16 0740 03/19/16 1143  GLUCAP 131* 108* 132*    Assessment/Plan: S/P Procedure(s) (LRB): ABDOMINAL WOUND CLOSURE (N/A) PERCUTANEOUS TRACHEOSTOMY (N/A) cont current care   LOS: 39 days    Kathlee Nationseter  Van Trigt III 03/19/2016

## 2016-03-19 NOTE — Clinical Social Work Note (Signed)
Clinical Social Worker continuing to follow patient and family for support. Patient with a trach, and was able to successfully wean to trach collar with orders to transfer. Patient sister not currently at bedside. Patient has been seen by psych and they are recommending continued follow up.  Psych CSW notified of the need for collateral information from family. Unit CSW remains available for support at this time.  Macario GoldsJesse Keylin Podolsky, [place] [phone]

## 2016-03-19 NOTE — Progress Notes (Signed)
Trauma Service Note  Subjective: Patient doing okay.  Very animated this AM and smiling.  Has been seen by psychiatry.    Objective: Vital signs in last 24 hours: Temp:  [98 F (36.7 C)-99 F (37.2 C)] 98 F (36.7 C) (05/12 0400) Pulse Rate:  [71-103] 83 (05/12 0800) Resp:  [0-35] 35 (05/12 0800) BP: (94-134)/(50-81) 125/75 mmHg (05/12 0800) SpO2:  [91 %-100 %] 99 % (05/12 0800) FiO2 (%):  [30 %-40 %] 40 % (05/12 0800) Weight:  [133.2 kg (293 lb 10.4 oz)] 133.2 kg (293 lb 10.4 oz) (05/12 0358) Last BM Date: 03/10/16  Intake/Output from previous day: 05/11 0701 - 05/12 0700 In: 3170 [I.V.:1210; NG/GT:1840] Out: 2001 [Urine:2000; Stool:1] Intake/Output this shift:    General: No acute distress.   Lungs: Wheezing a bit bilaterally.  Check CXR tomorrow.  RR and sats are okay.  Abd: Soft, tolerating tube feedings well.  Retention silastic tubing removed.  Extremities: No changes.  Venous Doppler studies are negative.  Neuro: The same  Lab Results: CBC   Recent Labs  03/18/16 0423 03/19/16 0408  WBC 16.9* 18.9*  HGB 7.0* 7.6*  HCT 23.1* 25.8*  PLT 179 177   BMET  Recent Labs  03/18/16 0423 03/19/16 0408  NA 145 141  K 3.9 4.2  CL 107 104  CO2 30 29  GLUCOSE 106* 146*  BUN 24* 23*  CREATININE 0.54 0.47  CALCIUM 8.2* 8.4*   PT/INR No results for input(s): LABPROT, INR in the last 72 hours. ABG No results for input(s): PHART, HCO3 in the last 72 hours.  Invalid input(s): PCO2, PO2  Studies/Results: Dg Pelvis 3v Judet  03/18/2016  CLINICAL DATA:  Follow-up pelvic ring fractures status post ORIF. EXAM: JUDET PELVIS - 3+ VIEW COMPARISON:  03/02/2016 and 02/16/2016. FINDINGS: The 2 cannulated screws traversing both sacroiliac joints are unchanged in position. There is no evidence of hardware loosening. External fixators are present within both iliac bones. Nondisplaced right sacral fracture appears unchanged. There is no sacroiliac joint diastasis. The  mildly displaced fractures of both pubic rami are unchanged in alignment with evidence of only minimal interval healing. IMPRESSION: Unchanged alignment of pelvic fractures status post ORIF and external fixation. Minimal interval healing. Electronically Signed   By: Carey BullocksWilliam  Veazey M.D.   On: 03/18/2016 08:01   Dg Chest Port 1 View  03/18/2016  CLINICAL DATA:  Follow-up of right-sided hemo thorax EXAM: PORTABLE CHEST 1 VIEW COMPARISON:  Portable chest x-ray of Mar 17, 2016 FINDINGS: The lungs are adequately inflated. A large right-sided hemo thorax persists. There is no pneumothorax. On the left the interstitial markings remain increased diffusely. An infiltrate in the retrocardiac region is better demonstrated today. The cardiac silhouette is mildly enlarged. The pulmonary vascularity is not engorged. The tracheostomy appliance tip lies at the level of the clavicular heads. The right PICC line tip projects over the midportion of the SVC. Multiple right rib fractures are again demonstrated. IMPRESSION: Persistent large hemo thorax on the right. No significant mediastinal shift. No pneumothorax is evident. Persistent increased interstitial markings compatible with interstitial edema. Probable left lower lobe atelectasis or pneumonia. Electronically Signed   By: David  [place] M.D.   On: 03/18/2016 07:53   Dg Chest Port 1 View  03/17/2016  CLINICAL DATA:  Post of right chest tube removal EXAM: PORTABLE CHEST 1 VIEW COMPARISON:  03/17/2016 FINDINGS: Cardiomediastinal silhouette is stable. Tracheostomy tube is unchanged in position. The right chest tube has been removed. Displaced right rib  fractures are again noted. There is persistent increased density in right hemithorax probable combination of pleural effusion with atelectasis or infiltrate. Persistent interstitial prominence and hazy perihilar airspace is in left lung probable interstitial edema. Superimposed pneumonia cannot be excluded. No evidence of  pneumothorax. IMPRESSION: Tracheostomy tube is unchanged in position. The right chest tube has been removed. Displaced right rib fractures are again noted. There is persistent increased density in right hemithorax probable combination of pleural effusion with atelectasis or infiltrate. Persistent interstitial prominence and hazy perihilar airspace is in left lung probable interstitial edema. Superimposed pneumonia cannot be excluded. No evidence of pneumothorax. Electronically Signed   By: Natasha Mead M.D.   On: 03/17/2016 14:57    Anti-infectives: Anti-infectives    Start     Dose/Rate Route Frequency Ordered Stop   03/08/16 1400  levofloxacin (LEVAQUIN) IVPB 750 mg     750 mg 100 mL/hr over 90 Minutes Intravenous Every 24 hours 03/08/16 0912 03/10/16 1501   03/06/16 1400  ceFEPIme (MAXIPIME) 2 g in dextrose 5 % 50 mL IVPB  Status:  Discontinued     2 g 100 mL/hr over 30 Minutes Intravenous Every 12 hours 03/06/16 1344 03/08/16 0912   03/01/16 1330  ceFAZolin (ANCEF) IVPB 2g/100 mL premix     2 g 200 mL/hr over 30 Minutes Intravenous To ShortStay Surgical 02/29/16 0917 03/01/16 1452   02/11/16 0600  piperacillin-tazobactam (ZOSYN) IVPB 3.375 g  Status:  Discontinued     3.375 g 100 mL/hr over 30 Minutes Intravenous 4 times per day 02/10/16 2214 02/22/16 0853   02/10/16 1730  piperacillin-tazobactam (ZOSYN) IVPB 3.375 g  Status:  Discontinued     3.375 g 12.5 mL/hr over 240 Minutes Intravenous 3 times per day 02/10/16 1727 02/10/16 2214      Assessment/Plan: s/p Procedure(s): ABDOMINAL WOUND CLOSURE PERCUTANEOUS TRACHEOSTOMY Have removed silastic tubing as retention.  Midline wound is okay. Has been off the ventilator for almost 24 hours, will then consider transfer again.  LOS: 39 days   Marta Lamas. Gae Bon, MD, FACS [phone] Trauma Surgeon 03/19/2016

## 2016-03-19 NOTE — Progress Notes (Signed)
Care management continues to follow pt for disposition/discharge planning.  Pt continues on trach collar at 40%.  Seen by psychiatry yesterday.  Plan removal of pelvic external fixators on Monday by ortho MD.  PT recommending CIR;  OT recommending LTAC (though pt has no insurance, and LTAC will not accept).  Will continue to follow progress.    Quintella BatonJulie W. Horace Lukas, RN, BSN  Trauma/Neuro ICU Case Manager [phone]

## 2016-03-19 NOTE — Progress Notes (Signed)
Physical Therapy Treatment Patient Details Name: [name] Bell MRN: [id number] DOB: 07-01-1981 Today's Date: 03/19/2016    History of Present Illness [name] Bell is a 35 y.o. female.She is obese. PMH unknown.Pt attempted suicide: taped DNR note to chest, drank Drano, then jumped off 5 story building ([address] [place]).S/p 02/09/16 gel foam embolization of bil internal iliac arteries.S/p 02/09/16 ex lap with cholecystectomy, resection SB and mesenteric repair, liver lac repair, application or wound vac.S/p 02/09/16 closed reduction of pelvic ring, external pelvic fixation.Also has right metacarpal fracture in splint. Pt intubated via trach.    PT Comments    Pt is more lethargic during today's session than in previous sessions.  She does follow commands at hands and toes, but is resistive to following commands, even assisted at knees and shoulders.  PROM to bil LEs preformed with increased resistance to L LE PROM today than last session.  Pt due to have x-fixator removed and ORIF preformed on Monday 03/22/16.  PT will re-evaluate once post-op orders received.    Follow Up Recommendations  CIR;Supervision/Assistance - 24 hour     Equipment Recommendations  Wheelchair (measurements PT);Wheelchair cushion (measurements PT);Hospital bed;Other (comment) (hoyer lift, bari equipment)    Recommendations for Other Services Rehab consult     Precautions / Restrictions Precautions Precautions: Sternal;Other (comment) Precaution Comments: pelvic ex fix, multiple abdominal wounds, trach, feeding tube Required Braces or Orthoses: Other Brace/Splint Other Brace/Splint: L wrist in splint Restrictions Weight Bearing Restrictions: Yes LUE Weight Bearing: Non weight bearing RLE Weight Bearing: Non weight bearing LLE Weight Bearing: Non weight bearing    Mobility  Bed Mobility Overal bed mobility: Needs Assistance Bed Mobility: Rolling Rolling: +2 for physical assistance;Total assist (4)          General bed mobility comments: No initiation to help roll or stay in sidelying once rolled today. Pt rolled bil to donn abdominal binder                                         Cognition Arousal/Alertness: Lethargic Behavior During Therapy: WFL for tasks assessed/performed Overall Cognitive Status: Impaired/Different from baseline Area of Impairment: Attention;Following commands;Awareness;Problem solving   Current Attention Level: Focused   Following Commands: Follows one step commands with increased time Safety/Judgement: Decreased awareness of safety;Decreased awareness of deficits Awareness: Intellectual Problem Solving: Decreased initiation;Difficulty sequencing;Requires verbal cues;Requires tactile cues General Comments: Pt following one step commands within her ability and willingness (at times shaking head no when asked to do something).  Pt resisted L leg  PROM, yet was able to move it willingly when she anticipated pain.     Exercises General Exercises - Lower Extremity Ankle Circles/Pumps: PROM;Both;20 reps Heel Slides: PROM;Both;10 reps (in ER position to avoid pins on right, less ER needed on L)    General Comments General comments (skin integrity, edema, etc.): Pt more lethargic, more difficulty keeping eyes open.       Pertinent Vitals/Pain Pain Assessment: Faces Faces Pain Scale: Hurts whole lot Pain Location: bil LEs with ROM Pain Descriptors / Indicators: Grimacing;Guarding Pain Intervention(s): Limited activity within patient's tolerance;Monitored during session;Repositioned           PT Goals (current goals can now be found in the care plan section) Acute Rehab PT Goals Patient Stated Goal: unable to state Progress towards PT goals: Not progressing toward goals - comment (more lethargic today)  Frequency  Min 2X/week    PT Plan Current plan remains appropriate    Co-evaluation PT/OT/SLP Co-Evaluation/Treatment:  Yes Reason for Co-Treatment: Complexity of the patient's impairments (multi-system involvement);Necessary to address cognition/behavior during functional activity PT goals addressed during session: Mobility/safety with mobility;Strengthening/ROM       End of Session Equipment Utilized During Treatment: Oxygen (TC) Activity Tolerance: Patient limited by pain Patient left: in bed;with call bell/phone within reach;with nursing/sitter in room     Time: 1349-1413 PT Time Calculation (min) (ACUTE ONLY): 24 min  Charges:  $Therapeutic Activity: 8-22 mins                      Michaeljoseph Revolorio B. Akeira Lahm, PT, DPT [phone]   03/19/2016, 2:27 PM

## 2016-03-19 NOTE — Progress Notes (Signed)
Occupational Therapy Treatment Patient Details Name: [name] Bell MRN: [id number] DOB: 1981/09/14 Today's Date: 03/19/2016    History of present illness [name] Bell is a 35 y.o. female.She is obese. PMH unknown.Pt attempted suicide: taped DNR note to chest, drank Drano, then jumped off 5 story building ([address] [place]).S/p 02/09/16 gel foam embolization of bil internal iliac arteries.S/p 02/09/16 ex lap with cholecystectomy, resection SB and mesenteric repair, liver lac repair, application or wound vac.S/p 02/09/16 closed reduction of pelvic ring, external pelvic fixation.Also has right metacarpal fracture in splint. Pt intubated via trach.   OT comments  Pt demonstrates some resistance to therapy this session with AROM attempts. Pt demonstrates ability to move all extremities. Pt noted to have pitting edema in L UE R LE L LE this session. Pt with firm hard feel to areas of extremities due to increase swelling.    Follow Up Recommendations  LTACH;Supervision/Assistance - 24 hour    Equipment Recommendations  None recommended by OT    Recommendations for Other Services  (Ltach / Palliative medicine)    Precautions / Restrictions Precautions Precautions: Sternal;Other (comment) Precaution Comments: pelvic ex fix, multiple abdominal wounds, trach, feeding tube Required Braces or Orthoses: Other Brace/Splint Other Brace/Splint: L wrist in splint Restrictions Weight Bearing Restrictions: Yes LUE Weight Bearing: Non weight bearing RLE Weight Bearing: Non weight bearing LLE Weight Bearing: Non weight bearing       Mobility Bed Mobility Overal bed mobility: Needs Assistance Bed Mobility: Rolling Rolling: +2 for physical assistance;Total assist (4)         General bed mobility comments: No initiation to help roll or stay in sidelying once rolled today. Pt rolled bil to donn abdominal binder  Transfers                      Balance                                    ADL Overall ADL's : Needs assistance/impaired                                       General ADL Comments: (A) with log rolling patient to apply abdominal binder      Vision                     Perception     Praxis      Cognition   Behavior During Therapy: WFL for tasks assessed/performed Overall Cognitive Status: Impaired/Different from baseline Area of Impairment: Attention;Following commands;Awareness;Problem solving   Current Attention Level: Focused    Following Commands: Follows one step commands with increased time Safety/Judgement: Decreased awareness of safety;Decreased awareness of deficits Awareness: Intellectual Problem Solving: Decreased initiation;Difficulty sequencing;Requires verbal cues;Requires tactile cues General Comments: pt shaking head no at times with anticipation. Pt following command thumbs up thumbs down two fingers squeeze hand with R UE. pt needed max cueing at times to have willingness to participate. Pt resisting PROM and when provided nail bed pressure to assess sensation after first limb pt pulling all extremities aay in anticipation. pt facial grimace prior to contact showing awareness.     Extremity/Trunk Assessment               Exercises General Exercises - Lower Extremity Ankle Circles/Pumps: PROM;Both;20 reps Heel  Slides:  (in ER position to avoid pins on right, less ER needed on L) Other Exercises Other Exercises: PROM R UE and L UE with resistance met. Pt able to lift bil UE against gravity with max cueing. Pt moving away from paintful stimuli and holding it away from source ( OT)   Shoulder Instructions       General Comments      Pertinent Vitals/ Pain       Pain Assessment: Faces Faces Pain Scale: Hurts whole lot Pain Location: with any ROM  Pain Descriptors / Indicators: Grimacing Pain Intervention(s): Monitored during session;Limited activity within patient's  tolerance  Home Living                                          Prior Functioning/Environment              Frequency Min 1X/week     Progress Toward Goals  OT Goals(current goals can now be found in the care plan section)  Progress towards OT goals: Progressing toward goals  Acute Rehab OT Goals Patient Stated Goal: unable to state OT Goal Formulation: Patient unable to participate in goal setting Time For Goal Achievement: 03/23/16 Potential to Achieve Goals: Fair ADL Goals Additional ADL Goal #1: pt will follow 1 step command 4 out 5 attempts Additional ADL Goal #2: Pt will tolerate EOB for 56minutes as precursor to adls Additional ADL Goal #3: Pt will demonstrates focused attention to adl task  Plan Discharge plan remains appropriate    Co-evaluation    PT/OT/SLP Co-Evaluation/Treatment: Yes Reason for Co-Treatment: Complexity of the patient's impairments (multi-system involvement);For patient/therapist safety PT goals addressed during session: Mobility/safety with mobility;Strengthening/ROM OT goals addressed during session: ADL's and self-care;Strengthening/ROM      End of Session Equipment Utilized During Treatment: Oxygen (rattle sounds)   Activity Tolerance Patient limited by lethargy   Patient Left in bed;with call bell/phone within reach;with nursing/sitter in room   Nurse Communication Mobility status;Need for lift equipment;Precautions;Weight bearing status        Time: 11314-3888OT Time Calculation (min): 24 min  Charges: OT General Charges $OT Visit: 1 Procedure OT Treatments $Therapeutic Activity: 8-22 mins  JParke PoissonB 03/19/2016, 2:56 PM  JJeri Modena  OTR/L Pager: [phone]: [phone].

## 2016-03-19 NOTE — Progress Notes (Signed)
Nutrition Follow-up  DOCUMENTATION CODES:   Obesity unspecified  INTERVENTION:   Pivot 1.5 @ 60 ml/hr 30 ml Prostat daily  Provides: 2260 kcal, 150 grams protein, 1092 ml H2O.  Total free water: 2692 ml   NUTRITION DIAGNOSIS:   Increased nutrient needs related to wound healing, catabolic illness as evidenced by estimated needs. Ongoing.   GOAL:   Patient will meet greater than or equal to 90% of their needs Met.   MONITOR:   Skin, I & O's, TF tolerance, Weight trends  ASSESSMENT:   Pt from DC with no past medical hx admitted after suicide attempt by ingestion of Drano, 2 bottles of acetaminophen, nyquil, and possibly 6 alprazolam pills and jumping from 5th floor balcony of hotel. 4/3 pt is s/p exp lap, hepatorraphy, SBR, cholecystectomy, preperitoneal pelvic packing, ex fix pelvis, R HPTX, Bil pulmonary contusions. Plan for repeat OR 4/5 for another possible SBR. Abdomen remains open.   4/12 PEG 4/24 trach, abd closed  Pt discussed during ICU rounds and with RN.  Medications reviewed and include: ferrous sulfate Free water: 400 ml every 6 hours = 1600 ml CBG's: 108-147 Pt positive 38 L  Diet Order:  Diet NPO time specified  Skin:  Wound (see comment) (stage II hip, surgical incisions, lacerations)  Last BM:  5/10  Height:   Ht Readings from Last 1 Encounters:  03/18/16 5' 11"  (1.803 m)    Weight:   Wt Readings from Last 1 Encounters:  03/19/16 293 lb 10.4 oz (133.2 kg)    Ideal Body Weight:  70.4 kg  BMI:  Body mass index is 40.97 kg/(m^2).  Estimated Nutritional Needs:   Kcal:  2200-2400  Protein:  130-150 grams  Fluid:  >2.2 L/day  EDUCATION NEEDS:   No education needs identified at this time  [place], [place], [place] Pager [phone] After Hours Pager

## 2016-03-20 ENCOUNTER — Inpatient Hospital Stay (HOSPITAL_COMMUNITY): Payer: Medicaid Other

## 2016-03-20 LAB — GLUCOSE, CAPILLARY
GLUCOSE-CAPILLARY: 101 mg/dL — AB (ref 65–99)
GLUCOSE-CAPILLARY: 110 mg/dL — AB (ref 65–99)
GLUCOSE-CAPILLARY: 114 mg/dL — AB (ref 65–99)
GLUCOSE-CAPILLARY: 123 mg/dL — AB (ref 65–99)
Glucose-Capillary: 132 mg/dL — ABNORMAL HIGH (ref 65–99)
Glucose-Capillary: 135 mg/dL — ABNORMAL HIGH (ref 65–99)
Glucose-Capillary: 142 mg/dL — ABNORMAL HIGH (ref 65–99)

## 2016-03-20 MED ORDER — CLONAZEPAM 1 MG PO TABS
1.0000 mg | ORAL_TABLET | Freq: Three times a day (TID) | ORAL | Status: DC
  Administered 2016-03-20 – 2016-03-26 (×17): 1 mg
  Filled 2016-03-20 (×17): qty 1

## 2016-03-20 MED ORDER — LORAZEPAM 2 MG/ML IJ SOLN
1.0000 mg | INTRAMUSCULAR | Status: DC | PRN
  Administered 2016-03-20 – 2016-04-06 (×10): 2 mg via INTRAVENOUS
  Filled 2016-03-20 (×11): qty 1

## 2016-03-20 MED ORDER — FREE WATER
200.0000 mL | Freq: Three times a day (TID) | Status: DC
  Administered 2016-03-20 – 2016-04-09 (×54): 200 mL

## 2016-03-20 MED ORDER — DIPHENHYDRAMINE HCL 12.5 MG/5ML PO ELIX
25.0000 mg | ORAL_SOLUTION | Freq: Four times a day (QID) | ORAL | Status: DC | PRN
  Administered 2016-03-20 – 2016-04-05 (×6): 25 mg
  Filled 2016-03-20 (×6): qty 10

## 2016-03-20 NOTE — Progress Notes (Addendum)
Patient ID: [name] Bell, female   DOB: May 04, 1981, 35 y.o.   MRN: [id number] Follow up - Trauma Critical Care  Patient Details:    [name] Bell is an 35 y.o. female.  Lines/tubes : PICC Double Lumen 03/02/16 PICC Right Cephalic 50 cm 0 cm (Active)  Indication for Insertion or Continuance of Line Limited venous access - need for IV therapy >5 days (PICC only);Prolonged intravenous therapies 03/19/2016  8:00 PM  Exposed Catheter (cm) 0 cm 03/01/2016  8:00 AM  Site Assessment Clean;Dry;Intact 03/19/2016  8:00 PM  Lumen #1 Status Flushed;Capped (Central line);Blood return noted 03/19/2016  8:00 PM  Lumen #2 Status Infusing;Flushed;Other (Comment) 03/19/2016  8:00 PM  Dressing Type Transparent 03/19/2016  8:00 PM  Dressing Status Clean;Dry;Intact 03/19/2016  8:00 PM  Line Care Connections checked and tightened 03/19/2016  8:00 PM  Dressing Intervention Dressing changed;Antimicrobial disc changed 03/16/2016  4:30 PM  Dressing Change Due 03/23/16 03/19/2016  8:00 PM     Gastrostomy/Enterostomy Gastrostomy 24 Fr. LUQ (Active)  Surrounding Skin Macerated 03/20/2016  1:00 AM  Tube Status Patent 03/20/2016  1:00 AM  Drainage Appearance Purulent 03/19/2016  8:00 AM  Dressing Status New drainage 03/20/2016  1:00 AM  Dressing Intervention Dressing changed 03/20/2016  1:00 AM  Dressing Type Split gauze 03/20/2016  1:00 AM  Dressing Change Due 03/18/16 03/17/2016  8:00 AM  G Port Intake (mL) 400 ml 03/19/2016  8:00 AM  Output (mL) 20 mL 03/14/2016  6:00 AM     Urethral Catheter S. Bowman,RN Latex 16 Fr. (Active)  Indication for Insertion or Continuance of Catheter Other (comment) 03/19/2016  8:00 PM  Site Assessment Clean;Intact;Swelling 03/19/2016  8:00 PM  Catheter Maintenance Catheter secured;Drainage bag/tubing not touching floor;Insertion date on drainage bag;No dependent loops;Seal intact;Bag below level of bladder 03/19/2016  8:00 PM  Collection Container Standard drainage bag 03/19/2016  8:00 PM  Securement  Method Securing device (Describe) 03/19/2016  8:00 PM  Urinary Catheter Interventions Unclamped 03/19/2016  8:00 PM  Input (mL) 210 mL 03/20/2016  6:00 AM  Output (mL) 100 mL 03/19/2016  6:00 AM    Microbiology/Sepsis markers: Results for orders placed or performed during the hospital encounter of 02/09/16  MRSA PCR Screening     Status: None   Collection Time: 02/11/16 11:30 AM  Result Value Ref Range Status   MRSA by PCR NEGATIVE NEGATIVE Final    Comment:        The GeneXpert MRSA Assay (FDA approved for NASAL specimens only), is one component of a comprehensive MRSA colonization surveillance program. It is not intended to diagnose MRSA infection nor to guide or monitor treatment for MRSA infections.   Anaerobic culture     Status: None   Collection Time: 02/26/16  1:19 PM  Result Value Ref Range Status   Specimen Description FLUID PLEURAL RIGHT  Final   Special Requests POF ANCEF  Final   Gram Stain   Final    DEGENERATED CELLULAR MATERIAL PRESENT NO ORGANISMS SEEN    Culture NO ANAEROBES ISOLATED  Final   Report Status 03/02/2016 FINAL  Final  Body fluid culture     Status: None   Collection Time: 02/26/16  1:19 PM  Result Value Ref Range Status   Specimen Description FLUID RIGHT PLEURAL  Final   Special Requests POF ANCEF  Final   Gram Stain   Final    DEGENERATED CELLULAR MATERIAL PRESENT NO ORGANISMS SEEN    Culture   Final  MULTIPLE ORGANISMS PRESENT, NONE PREDOMINANT CRITICAL RESULT CALLED TO, READ BACK BY AND VERIFIED WITH: J PINEDA 02/27/16 @ 1314 M VESTAL    Report Status 02/28/2016 FINAL  Final  Culture, respiratory (NON-Expectorated)     Status: None   Collection Time: 03/03/16  1:17 PM  Result Value Ref Range Status   Specimen Description TRACHEAL ASPIRATE  Final   Special Requests Normal  Final   Gram Stain   Final    FEW WBC PRESENT,BOTH PMN AND MONONUCLEAR NO SQUAMOUS EPITHELIAL CELLS SEEN NO ORGANISMS SEEN Performed at [facility]     Culture   Final    FEW ENTEROBACTER CLOACAE Performed at [facility]    Report Status 03/06/2016 FINAL  Final   Organism ID, Bacteria ENTEROBACTER CLOACAE  Final      Susceptibility   Enterobacter cloacae - MIC*    CEFAZOLIN >=64 RESISTANT Resistant     CEFEPIME <=1 SENSITIVE Sensitive     CEFTAZIDIME >=64 RESISTANT Resistant     CEFTRIAXONE >=64 RESISTANT Resistant     CIPROFLOXACIN <=0.25 SENSITIVE Sensitive     GENTAMICIN <=1 SENSITIVE Sensitive     IMIPENEM <=0.25 SENSITIVE Sensitive     PIP/TAZO >=128 RESISTANT Resistant     TOBRAMYCIN <=1 SENSITIVE Sensitive     TRIMETH/SULFA Value in next row Sensitive      <=20 SENSITIVE(NOTE)    * FEW ENTEROBACTER CLOACAE  Culture, respiratory (NON-Expectorated)     Status: None   Collection Time: 03/13/16  3:50 PM  Result Value Ref Range Status   Specimen Description TRACHEAL ASPIRATE  Final   Special Requests Normal  Final   Gram Stain   Final    MODERATE WBC PRESENT, PREDOMINANTLY PMN MODERATE SQUAMOUS EPITHELIAL CELLS PRESENT FEW GRAM POSITIVE COCCI IN PAIRS IN CLUSTERS Performed at [facility]    Culture   Final    NORMAL OROPHARYNGEAL FLORA Performed at [facility]    Report Status 03/16/2016 FINAL  Final  Culture, respiratory (NON-Expectorated)     Status: None   Collection Time: 03/17/16 11:32 AM  Result Value Ref Range Status   Specimen Description TRACHEAL ASPIRATE  Final   Special Requests Normal  Final   Gram Stain   Final    FEW WBC PRESENT,BOTH PMN AND MONONUCLEAR RARE SQUAMOUS EPITHELIAL CELLS PRESENT NO ORGANISMS SEEN Performed at [facility]    Culture   Final    NORMAL OROPHARYNGEAL FLORA Performed at [facility]    Report Status 03/19/2016 FINAL  Final    Anti-infectives:  Anti-infectives    Start     Dose/Rate Route Frequency Ordered Stop   03/08/16 1400  levofloxacin (LEVAQUIN) IVPB 750 mg     750 mg 100 mL/hr over 90 Minutes Intravenous Every 24  hours 03/08/16 0912 03/10/16 1501   03/06/16 1400  ceFEPIme (MAXIPIME) 2 g in dextrose 5 % 50 mL IVPB  Status:  Discontinued     2 g 100 mL/hr over 30 Minutes Intravenous Every 12 hours 03/06/16 1344 03/08/16 0912   03/01/16 1330  ceFAZolin (ANCEF) IVPB 2g/100 mL premix     2 g 200 mL/hr over 30 Minutes Intravenous To [facility] 02/29/16 0917 03/01/16 1452   02/11/16 0600  piperacillin-tazobactam (ZOSYN) IVPB 3.375 g  Status:  Discontinued     3.375 g 100 mL/hr over 30 Minutes Intravenous 4 times per day 02/10/16 2214 02/22/16 0853   02/10/16 1730  piperacillin-tazobactam (ZOSYN) IVPB 3.375 g  Status:  Discontinued     3.375 g 12.5 mL/hr over 240 Minutes Intravenous 3 times per day 02/10/16 1727 02/10/16 2214      Best Practice/Protocols:  VTE Prophylaxis: Lovenox (prophylaxtic dose) Intermittent Sedation  Consults: Treatment Team:  Myrene GalasMichael [name], MD Kerin PernaPeter Van Trigt, MD Leata MouseJanardhana Jonnalagadda, MD   Subjective:    Overnight Issues: back on vent  Objective:  Vital signs for last 24 hours: Temp:  [97.9 F (36.6 C)-98.5 F (36.9 C)] 97.9 F (36.6 C) (05/13 0400) Pulse Rate:  [70-100] 96 (05/13 0725) Resp:  [0-34] 21 (05/13 0725) BP: (79-124)/(42-89) 107/61 mmHg (05/13 0725) SpO2:  [93 %-100 %] 100 % (05/13 0725) FiO2 (%):  [30 %-40 %] 30 % (05/13 0725)  Hemodynamic parameters for last 24 hours:    Intake/Output from previous day: 05/12 0701 - 05/13 0700 In: 4955 [I.V.:1200; NG/GT:2240] Out: -   Intake/Output this shift:    Vent settings for last 24 hours: Vent Mode:  [-] PRVC FiO2 (%):  [30 %-40 %] 30 % Set Rate:  [20 bmp] 20 bmp Vt Set:  [490 mL] 490 mL PEEP:  [5 cmH20] 5 cmH20 Plateau Pressure:  [24 cmH20] 24 cmH20  Physical Exam:  General: alert Neuro: alert and F/U HEENT/Neck: trach-clean, intact Resp: rhonchi bilaterally CVS: RRR GI: Soft, wound very clean, elastemers out yesterday Extremities: mild edema  Results for orders placed or  performed during the hospital encounter of 02/09/16 (from the past 24 hour(s))  Glucose, capillary     Status: Abnormal   Collection Time: 03/19/16 11:43 AM  Result Value Ref Range   Glucose-Capillary 132 (H) 65 - 99 mg/dL   Comment 1 Notify RN    Comment 2 Document in Chart   Glucose, capillary     Status: Abnormal   Collection Time: 03/19/16  3:43 PM  Result Value Ref Range   Glucose-Capillary 120 (H) 65 - 99 mg/dL   Comment 1 Notify RN    Comment 2 Document in Chart   Glucose, capillary     Status: Abnormal   Collection Time: 03/19/16  8:05 PM  Result Value Ref Range   Glucose-Capillary 131 (H) 65 - 99 mg/dL  Glucose, capillary     Status: Abnormal   Collection Time: 03/19/16 11:36 PM  Result Value Ref Range   Glucose-Capillary 135 (H) 65 - 99 mg/dL  Glucose, capillary     Status: Abnormal   Collection Time: 03/20/16  3:54 AM  Result Value Ref Range   Glucose-Capillary 123 (H) 65 - 99 mg/dL  Glucose, capillary     Status: Abnormal   Collection Time: 03/20/16  7:57 AM  Result Value Ref Range   Glucose-Capillary 110 (H) 65 - 99 mg/dL    Assessment & Plan: Present on Admission:  . Fall from, out of or through building, not otherwise specified, initial encounter . Hypovolemic shock (HCC) . Multiple pelvic fractures (HCC) . Left wrist fracture . Multiple fractures of ribs of right side . Fracture of multiple ribs of left side . Liver laceration . Small intestine injury . Suicide attempt (HCC) . Bilateral pulmonary contusion . Acute respiratory failure (HCC) . Ingestion of caustic substance . Tylenol overdose . Hyperglycemia   LOS: 40 days   Additional comments:I reviewed the patient's new clinical lab test results. . Jump from hotel/multiple toxic ingestions - on SSRI, sitter S/P ex lap, hepatorraphy, SBR, cholecystectomy, preperitoneal pelvic packing [name] 4/3  Ex lap for acidosis/bleeding 4/4 Sherrod Toothman Ex lap for bleeding 4/5 [name] S/P ex  fix pelvis [name]  4/3 S/P ex lap 4/7 Dayanira Giovannetti S/P ex lap 4/10 Celicia Minahan S/P sacral screws 4/10 [name] S/P application ABRA 4/12 [name]  S/P abd closure, trach 4/24 [name] Resp failure - back on ventovernight, HTC now and as tolerated, mucolytic to help secretions Mult B rib FX/R HPTX, B pulm contusions - Rt CT out Multiple toxic ingestions Mult L wrist FXs - splint for now Pelvic FX - S/P ex fix and angioembolization, S/P sacral screw by Dr. [name]. Plan ex fix as definitive treatment anteriorly. F/U X-rays done.Dr. [name] to remove ex fix Monday ABL anemia  Hyperglycemia - SSI FEN - continue tube feeds, decrease free water VTE - SCD's, Lovenox dose decreased per TCTS. surveillance dopplers neg 5/11 Dispo - ICU, psychiatry consult Critical Care Total Time*: 30 Minutes  [name], MD, MPH, FACS Trauma: [phone] General Surgery: [phone]  03/20/2016  *Care during the described time interval was provided by me. I have reviewed this patient's available data, including medical history, events of note, physical examination and test results as part of my evaluation.

## 2016-03-20 NOTE — Progress Notes (Addendum)
Pt with decreased spo2 and increased wob. Pt lavaged with 5 cc saline through trach with very little secretions obtained. Pt with rhonchi BS in upper lobes and diminished in bases. Pt placed on ventilator with immediate improvement in spo2 and wob. Pt resting comfortably at this time. RN aware. RT will continue to monitor.

## 2016-03-21 LAB — CBC WITH DIFFERENTIAL/PLATELET
Basophils Absolute: 0.1 10*3/uL (ref 0.0–0.1)
Basophils Relative: 1 %
EOS ABS: 1.9 10*3/uL — AB (ref 0.0–0.7)
EOS PCT: 17 %
HCT: 22.7 % — ABNORMAL LOW (ref 36.0–46.0)
Hemoglobin: 6.8 g/dL — CL (ref 12.0–15.0)
Lymphocytes Relative: 10 %
Lymphs Abs: 1.2 10*3/uL (ref 0.7–4.0)
MCH: 30.2 pg (ref 26.0–34.0)
MCHC: 30 g/dL (ref 30.0–36.0)
MCV: 100.9 fL — AB (ref 78.0–100.0)
Monocytes Absolute: 1.4 10*3/uL — ABNORMAL HIGH (ref 0.1–1.0)
Monocytes Relative: 12 %
NEUTROS PCT: 61 %
Neutro Abs: 7.2 10*3/uL (ref 1.7–7.7)
PLATELETS: 149 10*3/uL — AB (ref 150–400)
RBC: 2.25 MIL/uL — ABNORMAL LOW (ref 3.87–5.11)
RDW: 18.9 % — ABNORMAL HIGH (ref 11.5–15.5)
WBC: 11.8 10*3/uL — AB (ref 4.0–10.5)

## 2016-03-21 LAB — BASIC METABOLIC PANEL
ANION GAP: 8 (ref 5–15)
BUN: 21 mg/dL — ABNORMAL HIGH (ref 6–20)
CALCIUM: 8.2 mg/dL — AB (ref 8.9–10.3)
CO2: 31 mmol/L (ref 22–32)
Chloride: 104 mmol/L (ref 101–111)
Creatinine, Ser: 0.47 mg/dL (ref 0.44–1.00)
GFR calc Af Amer: >60 mL/min (ref >60)
GFR calc non Af Amer: >60 mL/min (ref >60)
Glucose, Bld: 111 mg/dL — ABNORMAL HIGH (ref 65–99)
POTASSIUM: 4.2 mmol/L (ref 3.5–5.1)
SODIUM: 143 mmol/L (ref 135–145)

## 2016-03-21 LAB — PREPARE RBC (CROSSMATCH)

## 2016-03-21 LAB — GLUCOSE, CAPILLARY
GLUCOSE-CAPILLARY: 144 mg/dL — AB (ref 65–99)
Glucose-Capillary: 105 mg/dL — ABNORMAL HIGH (ref 65–99)
Glucose-Capillary: 115 mg/dL — ABNORMAL HIGH (ref 65–99)
Glucose-Capillary: 117 mg/dL — ABNORMAL HIGH (ref 65–99)
Glucose-Capillary: 126 mg/dL — ABNORMAL HIGH (ref 65–99)
Glucose-Capillary: 128 mg/dL — ABNORMAL HIGH (ref 65–99)

## 2016-03-21 NOTE — Progress Notes (Signed)
Trauma Service Note  Subjective: Patient just placed back on trach collar after being put back on the ventilator yesterday afternoon.    Objective: Vital signs in last 24 hours: Temp:  [98.5 F (36.9 C)-100.8 F (38.2 C)] 100.8 F (38.2 C) (05/14 0339) Pulse Rate:  [69-104] 91 (05/14 0700) Resp:  [0-27] 0 (05/14 0700) BP: (92-127)/(51-74) 118/73 mmHg (05/14 0700) SpO2:  [88 %-100 %] 97 % (05/14 0700) FiO2 (%):  [30 %-50 %] 40 % (05/14 0406) Last BM Date: 03/18/16  Intake/Output from previous day: 05/13 0701 - 05/14 0700 In: 3670 [I.V.:1230; NG/GT:2440] Out: 2750 [Urine:2750] Intake/Output this shift:    General: Seems uncomfortable.  Right leg hurts  Lungs: Slightly coarse right lung  Abd: Benign.  Excellent bowel sounds.  Extremities: Right foot still edematous and darkened toes.  Goo pulses.  Neuro: Anxious.  Intact.  Moves all fours.  Lab Results: CBC   Recent Labs  03/19/16 0408 03/21/16 0345  WBC 18.9* 11.8*  HGB 7.6* 6.8*  HCT 25.8* 22.7*  PLT 177 149*   BMET  Recent Labs  03/19/16 0408 03/21/16 0345  NA 141 143  K 4.2 4.2  CL 104 104  CO2 29 31  GLUCOSE 146* 111*  BUN 23* 21*  CREATININE 0.47 0.47  CALCIUM 8.4* 8.2*   PT/INR No results for input(s): LABPROT, INR in the last 72 hours. ABG No results for input(s): PHART, HCO3 in the last 72 hours.  Invalid input(s): PCO2, PO2  Studies/Results: Dg Chest Port 1 View  03/20/2016  CLINICAL DATA:  Follow-up hemothorax EXAM: PORTABLE CHEST 1 VIEW COMPARISON:  Mar 18, 2016 FINDINGS: The tracheostomy tube and right PICC line are in stable position. Persistent opacity in the right pleural space consistent with reported hemothorax. Poorly aerated right lung again identified. Persistent edema. The cardiomediastinal silhouette is stable. No other interval changes. IMPRESSION: Persistent right hemothorax, unchanged. This results in compressive atelectasis of the right lung with poor aeration. Persistent  edema. No interval change. Electronically Signed   By: Gerome Samavid  Williams III M.D   On: 03/20/2016 07:27    Anti-infectives: Anti-infectives    Start     Dose/Rate Route Frequency Ordered Stop   03/08/16 1400  levofloxacin (LEVAQUIN) IVPB 750 mg     750 mg 100 mL/hr over 90 Minutes Intravenous Every 24 hours 03/08/16 0912 03/10/16 1501   03/06/16 1400  ceFEPIme (MAXIPIME) 2 g in dextrose 5 % 50 mL IVPB  Status:  Discontinued     2 g 100 mL/hr over 30 Minutes Intravenous Every 12 hours 03/06/16 1344 03/08/16 0912   03/01/16 1330  ceFAZolin (ANCEF) IVPB 2g/100 mL premix     2 g 200 mL/hr over 30 Minutes Intravenous To ShortStay Surgical 02/29/16 0917 03/01/16 1452   02/11/16 0600  piperacillin-tazobactam (ZOSYN) IVPB 3.375 g  Status:  Discontinued     3.375 g 100 mL/hr over 30 Minutes Intravenous 4 times per day 02/10/16 2214 02/22/16 0853   02/10/16 1730  piperacillin-tazobactam (ZOSYN) IVPB 3.375 g  Status:  Discontinued     3.375 g 12.5 mL/hr over 240 Minutes Intravenous 3 times per day 02/10/16 1727 02/10/16 2214      Assessment/Plan: s/p Procedure(s): ABDOMINAL WOUND CLOSURE PERCUTANEOUS TRACHEOSTOMY HIT panel.  Platelets down and hemoglobin down again.  Stop Lovenox.   LOS: 41 days   Marta LamasJames O. Gae BonWyatt, III, MD, FACS [phone] Trauma Surgeon 03/21/2016

## 2016-03-21 NOTE — Progress Notes (Signed)
Pt spo2 decreased to 68% with increased WOB and agitation. Took pt off trach collar and placed back on Ventilator on full support with fio2 increased to 60%. Pt improving slowly. RN at bedside. RT will continue to closely monitor and wean as tolerated.

## 2016-03-21 NOTE — Progress Notes (Signed)
Attempted to place pt on CPAP/PS as she was not able to tolerate Trach Collar this morning. Pt MV < 3, RR <8. Pt placed back on FS at 1148. RN aware. RT will continue to monitor.

## 2016-03-22 LAB — CBC WITH DIFFERENTIAL/PLATELET
Basophils Absolute: 0 10*3/uL (ref 0.0–0.1)
Basophils Relative: 0 %
Eosinophils Absolute: 1.9 10*3/uL — ABNORMAL HIGH (ref 0.0–0.7)
Eosinophils Relative: 17 %
HCT: 23.3 % — ABNORMAL LOW (ref 36.0–46.0)
Hemoglobin: 7.5 g/dL — ABNORMAL LOW (ref 12.0–15.0)
LYMPHS ABS: 1.2 10*3/uL (ref 0.7–4.0)
LYMPHS PCT: 10 %
MCH: 30.9 pg (ref 26.0–34.0)
MCHC: 32.2 g/dL (ref 30.0–36.0)
MCV: 95.9 fL (ref 78.0–100.0)
MONOS PCT: 10 %
Monocytes Absolute: 1.2 10*3/uL — ABNORMAL HIGH (ref 0.1–1.0)
NEUTROS ABS: 7.2 10*3/uL (ref 1.7–7.7)
Neutrophils Relative %: 63 %
Platelets: 145 10*3/uL — ABNORMAL LOW (ref 150–400)
RBC: 2.43 MIL/uL — ABNORMAL LOW (ref 3.87–5.11)
RDW: 19.4 % — AB (ref 11.5–15.5)
WBC: 11.5 10*3/uL — ABNORMAL HIGH (ref 4.0–10.5)

## 2016-03-22 LAB — TYPE AND SCREEN
ABO/RH(D): O POS
ANTIBODY SCREEN: NEGATIVE
Unit division: 0

## 2016-03-22 LAB — GLUCOSE, CAPILLARY
GLUCOSE-CAPILLARY: 91 mg/dL (ref 65–99)
GLUCOSE-CAPILLARY: 96 mg/dL (ref 65–99)
GLUCOSE-CAPILLARY: 89 mg/dL (ref 65–99)
Glucose-Capillary: 92 mg/dL (ref 65–99)
Glucose-Capillary: 90 mg/dL (ref 65–99)

## 2016-03-22 MED ORDER — FUROSEMIDE 10 MG/ML PO SOLN
40.0000 mg | Freq: Every day | ORAL | Status: DC
  Administered 2016-03-22 – 2016-04-11 (×18): 40 mg via ORAL
  Filled 2016-03-22 (×22): qty 4

## 2016-03-22 MED ORDER — PANTOPRAZOLE SODIUM 40 MG IV SOLR
40.0000 mg | Freq: Every day | INTRAVENOUS | Status: DC
  Administered 2016-03-22 – 2016-03-27 (×3): 40 mg via INTRAVENOUS
  Filled 2016-03-22 (×3): qty 40

## 2016-03-22 MED ORDER — HYDROMORPHONE HCL 1 MG/ML IJ SOLN
0.5000 mg | INTRAMUSCULAR | Status: DC | PRN
  Administered 2016-03-22 – 2016-04-25 (×26): 0.5 mg via INTRAVENOUS
  Filled 2016-03-22 (×29): qty 1

## 2016-03-22 MED ORDER — QUETIAPINE FUMARATE 100 MG PO TABS
50.0000 mg | ORAL_TABLET | Freq: Two times a day (BID) | ORAL | Status: DC
  Administered 2016-03-22 – 2016-03-25 (×8): 50 mg
  Filled 2016-03-22 (×9): qty 1

## 2016-03-22 MED ORDER — FUROSEMIDE 8 MG/ML PO SOLN
40.0000 mg | Freq: Every day | ORAL | Status: DC
  Filled 2016-03-22: qty 5

## 2016-03-22 MED ORDER — PANTOPRAZOLE SODIUM 40 MG PO PACK
40.0000 mg | PACK | Freq: Every day | ORAL | Status: DC
  Administered 2016-03-23 – 2016-04-11 (×15): 40 mg
  Filled 2016-03-22 (×20): qty 20

## 2016-03-22 MED ORDER — OXYCODONE HCL 5 MG/5ML PO SOLN
10.0000 mg | ORAL | Status: DC | PRN
  Administered 2016-03-22 (×2): 10 mg
  Administered 2016-03-23 (×4): 15 mg
  Administered 2016-03-24: 20 mg
  Administered 2016-03-25: 10 mg
  Administered 2016-03-25 – 2016-03-26 (×2): 15 mg
  Administered 2016-03-26 – 2016-03-28 (×4): 20 mg
  Administered 2016-03-29: 10 mg
  Administered 2016-03-29: 20 mg
  Administered 2016-03-30: 10 mg
  Administered 2016-03-30 – 2016-03-31 (×2): 15 mg
  Administered 2016-04-01 – 2016-04-02 (×5): 20 mg
  Administered 2016-04-03: 15 mg
  Administered 2016-04-05: 20 mg
  Administered 2016-04-05: 10 mg
  Administered 2016-04-06: 20 mg
  Administered 2016-04-06: 15 mg
  Administered 2016-04-07 (×2): 20 mg
  Administered 2016-04-08: 10 mg
  Administered 2016-04-08: 15 mg
  Administered 2016-04-09 – 2016-04-10 (×5): 10 mg
  Administered 2016-04-11 – 2016-04-12 (×4): 20 mg
  Filled 2016-03-22 (×5): qty 20
  Filled 2016-03-22 (×2): qty 15
  Filled 2016-03-22: qty 20
  Filled 2016-03-22 (×2): qty 10
  Filled 2016-03-22: qty 20
  Filled 2016-03-22 (×2): qty 15
  Filled 2016-03-22 (×4): qty 20
  Filled 2016-03-22 (×2): qty 15
  Filled 2016-03-22: qty 10
  Filled 2016-03-22 (×2): qty 20
  Filled 2016-03-22: qty 15
  Filled 2016-03-22: qty 20
  Filled 2016-03-22: qty 15
  Filled 2016-03-22: qty 20
  Filled 2016-03-22 (×3): qty 10
  Filled 2016-03-22: qty 15
  Filled 2016-03-22 (×3): qty 10
  Filled 2016-03-22: qty 20
  Filled 2016-03-22: qty 15
  Filled 2016-03-22: qty 10
  Filled 2016-03-22 (×2): qty 20
  Filled 2016-03-22: qty 15
  Filled 2016-03-22: qty 20
  Filled 2016-03-22: qty 10
  Filled 2016-03-22: qty 15
  Filled 2016-03-22: qty 20
  Filled 2016-03-22: qty 10
  Filled 2016-03-22: qty 20

## 2016-03-22 NOTE — Progress Notes (Signed)
Patient ID: Phylliss BobKatia D Calleros, female   DOB: [birth year], 35 y.o.   MRN: [id number]   LOS: 42 days   Subjective: Somnolent but just got pain meds. Last night was delusional.   Objective: Vital signs in last 24 hours: Temp:  [97.9 F (36.6 C)-100.8 F (38.2 C)] 97.9 F (36.6 C) (05/15 0355) Pulse Rate:  [88-106] 90 (05/15 0700) Resp:  [0-23] 20 (05/15 0700) BP: (106-154)/(54-87) 115/64 mmHg (05/15 0700) SpO2:  [68 %-100 %] 98 % (05/15 0700) FiO2 (%):  [40 %-60 %] 40 % (05/15 0355) Last BM Date: 03/18/16   VENT: PRVC/40%/5PEEP/RR20/Vt43290ml   UOP: 6755ml/h NET: +65111ml/24h TOTAL: +4193056ml/admission   Laboratory CBC  Recent Labs  03/21/16 0345 03/22/16 0545  WBC 11.8* 11.5*  HGB 6.8* 7.5*  HCT 22.7* 23.3*  PLT 149* 145*   BMET  Recent Labs  03/21/16 0345  NA 143  K 4.2  CL 104  CO2 31  GLUCOSE 111*  BUN 21*  CREATININE 0.47  CALCIUM 8.2*   CBG (last 3)   Recent Labs  03/21/16 1940 03/21/16 2338 03/22/16 0328  GLUCAP 126* 115* 92    Physical Exam General appearance: no distress Resp: clear to auscultation bilaterally Cardio: regular rate and rhythm GI: Soft, +bS Extremities: edema 2+ pitting   Assessment/Plan: Jump from hotel/multiple toxic ingestions - on SSRI, sitter S/P ex lap, hepatorraphy, SBR, cholecystectomy, preperitoneal pelvic packing [name] 4/3  Ex lap for acidosis/bleeding 4/4 Thompson Ex lap for bleeding 4/5 [name] S/P ex fix pelvis [name] 4/3 S/P ex lap 4/7 Thompson S/P ex lap 4/10 Thompson S/P sacral screws 4/10 [name] S/P application ABRA 4/12 [name]  S/P abd closure, trach 4/24 [name] Resp failure - back on ventovernight, HTC now and as tolerated, mucolytic to help secretions Mult B rib FX/R HPTX, B pulm contusions  Multiple toxic ingestions Mult L wrist FXs - splint, ok for weight bearing in splint Pelvic FX - S/P ex fix and angioembolization, S/P sacral screw by Dr. Carola FrostHandy. Plan ex fix as definitive treatment anteriorly. F/U  X-rays done.Dr. Carola FrostHandy to remove ex fix Monday ABL anemia  Hyperglycemia - SSI FEN - Still quite edematous, add scheduled Lasix. Decrease Seroquel. VTE - SCD's, Lovenox stopped per TCTS due to continued pulmonic bleeding, ? restart. Surveillance dopplers neg 5/11. Dispo - ICU    Freeman CaldronMichael J. Klover Priestly, PA-C Pager: [phone] General Trauma PA Pager: [phone]  03/22/2016

## 2016-03-22 NOTE — Progress Notes (Signed)
SLP Cancellation Note  Patient Details Name: [name] Bell MRN: [id number] DOB: 11-Sep-1981   Cancelled treatment:        Spoke with RN this am re: pt's status. She has not tolerated trach collar trials as well as end of last week and has been on vent majority of weekend. Will see if appropriate for in-line valve (consistent alertness, ability to follow commands, decreased anxiey).   [name] Bell, [name] Bell 03/22/2016, 2:31 PM

## 2016-03-23 ENCOUNTER — Inpatient Hospital Stay (HOSPITAL_COMMUNITY): Payer: Medicaid Other

## 2016-03-23 LAB — GLUCOSE, CAPILLARY
GLUCOSE-CAPILLARY: 92 mg/dL (ref 65–99)
GLUCOSE-CAPILLARY: 121 mg/dL — AB (ref 65–99)
GLUCOSE-CAPILLARY: 136 mg/dL — AB (ref 65–99)
GLUCOSE-CAPILLARY: 140 mg/dL — AB (ref 65–99)
Glucose-Capillary: 95 mg/dL (ref 65–99)
Glucose-Capillary: 97 mg/dL (ref 65–99)

## 2016-03-23 LAB — HEPARIN INDUCED PLATELET AB (HIT ANTIBODY): Heparin Induced Plt Ab: 0.346 OD (ref 0.000–0.400)

## 2016-03-23 MED ORDER — ANTISEPTIC ORAL RINSE SOLUTION (CORINZ)
7.0000 mL | Freq: Four times a day (QID) | OROMUCOSAL | Status: DC
  Administered 2016-03-23 – 2016-03-31 (×31): 7 mL via OROMUCOSAL

## 2016-03-23 NOTE — Progress Notes (Signed)
Trauma Service Note  Subjective: Patient is doing well.  Not able to wean for extended periods of time.  No acute distress.  Answering questions and following commands  Objective: Vital signs in last 24 hours: Temp:  [97.4 F (36.3 C)-99.9 F (37.7 C)] 99.5 F (37.5 C) (05/16 0400) Pulse Rate:  [77-94] 87 (05/16 0700) Resp:  [0-22] 16 (05/16 0700) BP: (104-119)/(52-80) 108/64 mmHg (05/16 0700) SpO2:  [95 %-100 %] 96 % (05/16 0700) FiO2 (%):  [40 %] 40 % (05/16 0700) Weight:  [130 kg (286 lb 9.6 oz)] 130 kg (286 lb 9.6 oz) (05/16 0423) Last BM Date: 03/22/16  Intake/Output from previous day: 05/15 0701 - 05/16 0700 In: 1570 [I.V.:1150; NG/GT:200] Out: 3920 [Urine:3920] Intake/Output this shift: Total I/O In: 75 [I.V.:75] Out: -   General: No acute distress on the ventilator  Lungs: Rhonchorous bilaterally.  No wheezing.  Sats are 97-99% on FIO2 40%  Abd: Soft, good bowel sounds.  Extremities: No changes.  Will try to contact orthopedics to see when they plan on removing the external ifxator on patient  Neuro: Intact.  Lab Results: CBC   Recent Labs  03/21/16 0345 03/22/16 0545  WBC 11.8* 11.5*  HGB 6.8* 7.5*  HCT 22.7* 23.3*  PLT 149* 145*   BMET  Recent Labs  03/21/16 0345  NA 143  K 4.2  CL 104  CO2 31  GLUCOSE 111*  BUN 21*  CREATININE 0.47  CALCIUM 8.2*   PT/INR No results for input(s): LABPROT, INR in the last 72 hours. ABG No results for input(s): PHART, HCO3 in the last 72 hours.  Invalid input(s): PCO2, PO2  Studies/Results: No results found.  Anti-infectives: Anti-infectives    Start     Dose/Rate Route Frequency Ordered Stop   03/08/16 1400  levofloxacin (LEVAQUIN) IVPB 750 mg     750 mg 100 mL/hr over 90 Minutes Intravenous Every 24 hours 03/08/16 0912 03/10/16 1501   03/06/16 1400  ceFEPIme (MAXIPIME) 2 g in dextrose 5 % 50 mL IVPB  Status:  Discontinued     2 g 100 mL/hr over 30 Minutes Intravenous Every 12 hours 03/06/16  1344 03/08/16 0912   03/01/16 1330  ceFAZolin (ANCEF) IVPB 2g/100 mL premix     2 g 200 mL/hr over 30 Minutes Intravenous To ShortStay Surgical 02/29/16 0917 03/01/16 1452   02/11/16 0600  piperacillin-tazobactam (ZOSYN) IVPB 3.375 g  Status:  Discontinued     3.375 g 100 mL/hr over 30 Minutes Intravenous 4 times per day 02/10/16 2214 02/22/16 0853   02/10/16 1730  piperacillin-tazobactam (ZOSYN) IVPB 3.375 g  Status:  Discontinued     3.375 g 12.5 mL/hr over 240 Minutes Intravenous 3 times per day 02/10/16 1727 02/10/16 2214      Assessment/Plan: s/p Procedure(s): ABDOMINAL WOUND CLOSURE PERCUTANEOUS TRACHEOSTOMY Check CXR.  Reculture tracheal aspirate  I have been informed that ext-fix not to come off until Thursday or Friday. Restart tube feedings.  LOS: 43 days   Marta LamasJames O. Gae BonWyatt, III, MD, FACS [phone] Trauma Surgeon 03/23/2016

## 2016-03-23 NOTE — Progress Notes (Signed)
Occupational Therapy Treatment Patient Details Name: [name] Bell MRN: [id number] DOB: [birth year] Today's Date: 03/23/2016    History of present illness [name] Bell is a 35 y.o. female.She is obese. PMH unknown.Pt attempted suicide: taped DNR note to chest, drank Drano, then jumped off 5 story building ([address]).S/p 02/09/16 gel foam embolization of bil internal iliac arteries.S/p 02/09/16 ex lap with cholecystectomy, resection SB and mesenteric repair, liver lac repair, application or wound vac.S/p 02/09/16 closed reduction of pelvic ring, external pelvic fixation.Also has right metacarpal fracture in splint. Pt intubated via trach.   OT comments  Pt following all UE exercises this session and staff provided HEP handout to follow. OT will hold next treatment until after surgery with Dr handy. OT will assess HEP next session to determine need to add therabands to program.    Follow Up Recommendations  LTACH;Supervision/Assistance - 24 hour    Equipment Recommendations  None recommended by OT    Recommendations for Other Services Other (comment)    Precautions / Restrictions Precautions Precautions: Sternal;Other (comment) Precaution Comments: pelvic ex fix, multiple abdominal wounds, trach, feeding tube Required Braces or Orthoses: Other Brace/Splint Other Brace/Splint: L wrist in splint Restrictions LUE Weight Bearing: Weight bearing as tolerated RLE Weight Bearing: Non weight bearing LLE Weight Bearing: Non weight bearing       Mobility Bed Mobility                  Transfers                      Balance                                   ADL                                         General ADL Comments: total (A) to all adls      Vision                     Perception     Praxis      Cognition   Behavior During Therapy: WFL for tasks assessed/performed Overall Cognitive Status:  Impaired/Different from baseline                  General Comments: following commands 100% this session with selective attention.     Extremity/Trunk Assessment               Exercises General Exercises - Upper Extremity Shoulder Flexion: AAROM;Right;Left;10 reps;Supine Shoulder Extension: AAROM;Both;10 reps;Supine Shoulder ABduction: AAROM;Both;10 reps;Supine Elbow Flexion: AAROM;Both;10 reps;Supine Elbow Extension: AAROM;Both;10 reps;Supine Wrist Flexion: AAROM;10 reps;Supine;Right Wrist Extension: AAROM;Both;10 reps;Supine Other Exercises Other Exercises: grimace with L hand digit AROM but able to complete. Pt able to complete all exercises with min cueing at times for attention to task.  Other Exercises: handout made for sitter, RN staff for UE exercises x3 per day. Pt able to participate and return demo with (A) of stafff. Pt seen per to sedating medications passed with morning meds   Shoulder Instructions       General Comments      Pertinent Vitals/ Pain       Pain Assessment: Faces Faces Pain Scale: Hurts little more Pain Location: L forearm Pain Descriptors / Indicators:  Grimacing Pain Intervention(s): Repositioned;Limited activity within patient's tolerance  Home Living                                          Prior Functioning/Environment              Frequency Min 1X/week     Progress Toward Goals  OT Goals(current goals can now be found in the care plan section)  Progress towards OT goals: Progressing toward goals  Acute Rehab OT Goals Patient Stated Goal: unable to state OT Goal Formulation: Patient unable to participate in goal setting Time For Goal Achievement: 04/06/16 Potential to Achieve Goals: Fair ADL Goals Additional ADL Goal #1: pt will follow 1 step command 4 out 5 attempts (met) Additional ADL Goal #2: Pt will tolerate EOB for 41minutes as precursor to adls Additional ADL Goal #3: Pt will demonstrates  focused attention to adl task  Plan Discharge plan remains appropriate    Co-evaluation                 End of Session Equipment Utilized During Treatment: Oxygen   Activity Tolerance Patient tolerated treatment well   Patient Left in bed;with call bell/phone within reach;with nursing/sitter in room   Nurse Communication          Time: [id number] Time Calculation (min): 12 min  Charges: OT General Charges $OT Visit: 1 Procedure OT Treatments $Therapeutic Exercise: 8-22 mins  JParke PoissonB 03/23/2016, 10:45 AM  JJeri Modena  OTR/L Pager: [phone]: [phone].

## 2016-03-23 NOTE — Clinical Social Work Note (Signed)
Clinical Social Worker continuing to follow patient and family for support. Patient with a trach, and was able to successfully wean to trach collar this morning. Patient sister not currently at bedside. Patient has been seen by psych and they are recommending continued follow up. Psych CSW notified of the need for collateral information from family and patient ability to communicate through writing. Unit CSW remains available for support at this time.  Macario GoldsJesse Vanity Larsson, Kentucky[place]CSW [phone]

## 2016-03-23 NOTE — Progress Notes (Signed)
Orthopaedic Trauma Service Progress Note  Subjective  Awake Following commands   ROS As above  Objective   BP 105/59 mmHg  Pulse 101  Temp(Src) 100.3 F (37.9 C) (Axillary)  Resp 24  Ht 5\' 11"  (1.803 m)  Wt 130 kg (286 lb 9.6 oz)  BMI 39.99 kg/m2  SpO2 99%  LMP  (LMP Unknown)  Intake/Output      05/15 0701 - 05/16 0700 05/16 0701 - 05/17 0700   P.O. 0    I.V. (mL/kg) 1200 (9.2) 125 (1)   Blood     Other 220    NG/GT 200    Total Intake(mL/kg) 1620 (12.5) 125 (1)   Urine (mL/kg/hr) 3920 (1.3)    Total Output 3920     Net -2300 +125          Exam  Gen: comfortable appearing  Pelvis: pelvic pinsites stable  No signs of active infection  Good granulation tissue in wound beds    Assessment and Plan   POD/HD#: 6 weeks post pelvic fixator application    -complex pelvic ring fx s/p ex fix and SI screws:  Ex fix removed at bedside w/o complication  Iodoform packing ordered  Daily dressing changes  Wound beds looks stable  Re-eval on Thursday, possible that pinsites may need debridement but optimistic at this point     Pt still bed to chair transfers only for another 4-6 weeks. She can pivot to assist with transfers   No formal motion restrictions   - Dispo:  Continue per TS     Mearl LatinKeith W. Tressa Maldonado, PA-C [facility] [phone] (O) 03/23/2016 11:36 AM

## 2016-03-24 ENCOUNTER — Inpatient Hospital Stay (HOSPITAL_COMMUNITY): Payer: Medicaid Other

## 2016-03-24 LAB — GLUCOSE, CAPILLARY
GLUCOSE-CAPILLARY: 127 mg/dL — AB (ref 65–99)
GLUCOSE-CAPILLARY: 129 mg/dL — AB (ref 65–99)
GLUCOSE-CAPILLARY: 139 mg/dL — AB (ref 65–99)
Glucose-Capillary: 154 mg/dL — ABNORMAL HIGH (ref 65–99)
Glucose-Capillary: 140 mg/dL — ABNORMAL HIGH (ref 65–99)
Glucose-Capillary: 140 mg/dL — ABNORMAL HIGH (ref 65–99)

## 2016-03-24 LAB — CBC WITH DIFFERENTIAL/PLATELET
BASOS PCT: 0 %
Basophils Absolute: 0 10*3/uL (ref 0.0–0.1)
EOS PCT: 10 %
Eosinophils Absolute: 1.6 10*3/uL — ABNORMAL HIGH (ref 0.0–0.7)
HEMATOCRIT: 26.8 % — AB (ref 36.0–46.0)
HEMOGLOBIN: 7.8 g/dL — AB (ref 12.0–15.0)
Lymphocytes Relative: 6 %
Lymphs Abs: 1 10*3/uL (ref 0.7–4.0)
MCH: 29.2 pg (ref 26.0–34.0)
MCHC: 29.1 g/dL — ABNORMAL LOW (ref 30.0–36.0)
MCV: 100.4 fL — AB (ref 78.0–100.0)
MONOS PCT: 10 %
Monocytes Absolute: 1.6 10*3/uL — ABNORMAL HIGH (ref 0.1–1.0)
NEUTROS PCT: 74 %
Neutro Abs: 11.8 10*3/uL — ABNORMAL HIGH (ref 1.7–7.7)
Platelets: 135 10*3/uL — ABNORMAL LOW (ref 150–400)
RBC: 2.67 MIL/uL — AB (ref 3.87–5.11)
RDW: 18.1 % — ABNORMAL HIGH (ref 11.5–15.5)
WBC Morphology: INCREASED
WBC: 16 10*3/uL — AB (ref 4.0–10.5)

## 2016-03-24 LAB — BASIC METABOLIC PANEL
Anion gap: 7 (ref 5–15)
BUN: 16 mg/dL (ref 6–20)
CHLORIDE: 95 mmol/L — AB (ref 101–111)
CO2: 27 mmol/L (ref 22–32)
Calcium: 7.7 mg/dL — ABNORMAL LOW (ref 8.9–10.3)
Creatinine, Ser: 0.53 mg/dL (ref 0.44–1.00)
GFR calc Af Amer: >60 mL/min (ref >60)
GFR calc non Af Amer: >60 mL/min (ref >60)
Glucose, Bld: 397 mg/dL — ABNORMAL HIGH (ref 65–99)
POTASSIUM: 5.4 mmol/L — AB (ref 3.5–5.1)
SODIUM: 129 mmol/L — AB (ref 135–145)

## 2016-03-24 MED ORDER — SODIUM CHLORIDE 0.9 % IV SOLN
INTRAVENOUS | Status: DC
  Administered 2016-03-24: 50 mL via INTRAVENOUS
  Administered 2016-03-26: 04:00:00 via INTRAVENOUS

## 2016-03-24 NOTE — Progress Notes (Signed)
SLP Cancellation Note  Patient Details Name: [name] Bell MRN: [id number] DOB: 08-02-1981   Cancelled treatment:        Attempted treatment with [name] valve while on trach collar. RN stated pt given Versed earlier due to increased RR. Sitter stated she is calm and resting well. ST attempted to arouse briefly but ceased when evident that pt too sedated. Hopefully she can remain on trach collar.    Royce MacadamiaLitaker, Juluis Fitzsimmons Willis 03/24/2016, 11:37 AM   Breck CoonsLisa Willis Lonell FaceLitaker M.Ed [employer]-SLP Pager [phone]

## 2016-03-24 NOTE — Progress Notes (Signed)
Physical Therapy Treatment Patient Details Name: [name] BobKatia D Nickles MRN: [id number] DOB: 10-09-1981 Today's Date: 03/24/2016    History of Present Illness [name] Bell is a 35 y.o. female.She is obese. PMH unknown.Pt attempted suicide: taped DNR note to chest, drank Drano, then jumped [place] [address] [place]S/p 02/09/16 gel foam embolization of bil internal iliac arteries.S/p 02/09/16 ex lap with cholecystectomy, resection SB and mesenteric repair, liver lac repair, application or wound vac.S/p 02/09/16 closed reduction of pelvic ring, external pelvic fixation.Also has right metacarpal fracture in splint. Pt intubated via trach.  Pelvic x- fixator pins removed at bedside on 03/23/16.      PT Comments    Pt was able to sit EOB today and despite being ventilated was much more alert and assisting with her mobility the most I have seen.  She did have quite a bit of pain seated upright EOB.  Maxi sky lift used to transfer to chair to give her the benefits of being fully upright.  She is not yet able to attempt standing to transfer.  I continue to strongly recommend CIR level therapies once more stable.   Follow Up Recommendations  CIR     Equipment Recommendations  Wheelchair (measurements PT);Wheelchair cushion (measurements PT);Hospital bed;Other (comment) (hoyer lift, bari equipment)    Recommendations for Other Services Rehab consult     Precautions / Restrictions Precautions Precautions: Sternal;Other (comment) Precaution Comments: multiple abdominal wounds, trach, feeding tube Required Braces or Orthoses: Other Brace/Splint Other Brace/Splint: L wrist in splint, abdominal binder Restrictions LUE Weight Bearing: Weight bearing as tolerated RLE Weight Bearing: Weight bearing as tolerated (for transfers only, no gait) LLE Weight Bearing: Weight bearing as tolerated (for transfers only (no gait))    Mobility  Bed Mobility Overal bed mobility: Needs Assistance Bed  Mobility: Supine to Sit     Supine to sit: +2 for physical assistance;Max assist;HOB elevated     General bed mobility comments: Three person max assist to support trunk to get to sitting.  Assist also needed for bil LEs to help move to EOB (however, pt was initiating movement of theses legs as best she physically could to command today).  Most assist needed to bring trunk up and weight shift hips to get to EOB.   Transfers Overall transfer level: Needs assistance Equipment used: None             General transfer comment: Maxi sky pad donned in sitting on EOB and 4 person assist to safely transfer pt OOB to chair using lift.  Peri care prefomred while pt in lift as she was actively having a BM.          Balance Overall balance assessment: Needs assistance Sitting-balance support: Feet supported;Bilateral upper extremity supported Sitting balance-[name]: Poor Sitting balance - Comments: Pt supporting herself proped on bil upper extremities and fluctuates between mod and max assist in sitting EOB.  Pt grimacing in pain, especially the more upright we get her due to pelvis and abdomen Postural control: Posterior lean                          Cognition Arousal/Alertness: Awake/alert Behavior During Therapy: WFL for tasks assessed/performed Overall Cognitive Status: Impaired/Different from baseline Area of Impairment: Attention;Following commands;Awareness;Problem solving   Current Attention Level: Sustained   Following Commands: Follows one step commands consistently (when it is something she is agreeable to doing) Safety/Judgement: Decreased awareness of safety;Decreased awareness of deficits  Awareness: Intellectual   General Comments: Pt following most one step commands today and with no increased time to process.  Pt refusing to do some commands (like wash her own face).      Exercises General Exercises - Lower Extremity Ankle Circles/Pumps: AAROM;Both;20  reps Heel Slides: AAROM;Both;10 reps        Pertinent Vitals/Pain Pain Assessment: Faces Faces Pain Scale: Hurts worst Pain Location: while seated EOB and during transitions.  Much more comfortable looking once OOB in chair.  Pain Descriptors / Indicators: Grimacing;Guarding Pain Intervention(s): Limited activity within patient's tolerance;Monitored during session;Repositioned           PT Goals (current goals can now be found in the care plan section) Acute Rehab PT Goals Patient Stated Goal: to decrease pain PT Goal Formulation: Patient unable to participate in goal setting Time For Goal Achievement: 04/07/16 Potential to Achieve Goals: Good Progress towards PT goals: Progressing toward goals;Goals downgraded-see care plan (re-assessment completed)    Frequency  Min 5X/week    PT Plan Frequency needs to be updated       End of Session Equipment Utilized During Treatment: Other (comment) (RT to help with vent and vent line management as well as suc) Activity Tolerance: Patient limited by pain;Patient limited by fatigue Patient left: in chair;with call bell/phone within reach;with nursing/sitter in room     Time: 1425-1505 PT Time Calculation (min) (ACUTE ONLY): 40 min  Charges:  $Therapeutic Activity: 23-37 mins                     Jiro Kiester B. Decoda Van, PT, DPT [phone]   03/24/2016, 5:04 PM

## 2016-03-24 NOTE — Progress Notes (Signed)
Trauma Service Note  Subjective: Patient on trach collar and stayed on the collar all night.  Seems to be unhappy but not in distress.  Low tidal volume respirations  Objective: Vital signs in last 24 hours: Temp:  [98 F (36.7 C)-99.4 F (37.4 C)] 98.7 F (37.1 C) (05/17 0759) Pulse Rate:  [81-106] 88 (05/17 0800) Resp:  [0-29] 23 (05/17 0800) BP: (89-137)/(45-74) 118/64 mmHg (05/17 0800) SpO2:  [90 %-99 %] 94 % (05/17 0800) FiO2 (%):  [40 %] 40 % (05/17 0800) Weight:  [128.3 kg (282 lb 13.6 oz)] 128.3 kg (282 lb 13.6 oz) (05/17 0400) Last BM Date: 03/22/16  Intake/Output from previous day: 05/16 0701 - 05/17 0700 In: 3115 [I.V.:1275; NG/GT:1780] Out: 2825 [Urine:2825] Intake/Output this shift: Total I/O In: 110 [I.V.:50; NG/GT:60] Out: -   General: Unhappy.  No acute distress  Lungs: Shallow breaths.  Clear.  Sats are 90-93%  Abd: Wound is healing well.  No infection.  Extremities: No changes.  Neuro: Intact  Lab Results: CBC   Recent Labs  03/22/16 0545 03/24/16 0431  WBC 11.5* 16.0*  HGB 7.5* 7.8*  HCT 23.3* 26.8*  PLT 145* 135*   BMET  Recent Labs  03/24/16 0431  NA 129*  K 5.4*  CL 95*  CO2 27  GLUCOSE 397*  BUN 16  CREATININE 0.53  CALCIUM 7.7*   PT/INR No results for input(s): LABPROT, INR in the last 72 hours. ABG No results for input(s): PHART, HCO3 in the last 72 hours.  Invalid input(s): PCO2, PO2  Studies/Results: Dg Chest Port 1 View  03/23/2016  CLINICAL DATA:  Hemothorax right. EXAM: PORTABLE CHEST 1 VIEW COMPARISON:  03/20/2016 FINDINGS: Tracheostomy remains in good position. Progressive opacity overlying the right chest. This is due to a combination of airspace disease and pleural effusion/hemothorax. Multiple displaced right rib fractures noted. The pleural effusion appears to have progressed. There is also progression of airspace disease on the right Diffuse airspace disease also present on the left and unchanged. No  significant left effusion. Right arm PICC tip in the SVC. Mediastinal shift to the left due to right-sided effusion. IMPRESSION: Progressive right hemothorax and right airspace disease. CT may be helpful for follow-up to evaluate the size of the hemothorax. No chest tube in place. No pneumothorax. Diffuse bilateral airspace disease may be due to underlying pneumonia or ARDS. Electronically Signed   By: Marlan Palau M.D.   On: 03/23/2016 09:02    Anti-infectives: Anti-infectives    Start     Dose/Rate Route Frequency Ordered Stop   03/08/16 1400  levofloxacin (LEVAQUIN) IVPB 750 mg     750 mg 100 mL/hr over 90 Minutes Intravenous Every 24 hours 03/08/16 0912 03/10/16 1501   03/06/16 1400  ceFEPIme (MAXIPIME) 2 g in dextrose 5 % 50 mL IVPB  Status:  Discontinued     2 g 100 mL/hr over 30 Minutes Intravenous Every 12 hours 03/06/16 1344 03/08/16 0912   03/01/16 1330  ceFAZolin (ANCEF) IVPB 2g/100 mL premix     2 g 200 mL/hr over 30 Minutes Intravenous To ShortStay Surgical 02/29/16 0917 03/01/16 1452   02/11/16 0600  piperacillin-tazobactam (ZOSYN) IVPB 3.375 g  Status:  Discontinued     3.375 g 100 mL/hr over 30 Minutes Intravenous 4 times per day 02/10/16 2214 02/22/16 0853   02/10/16 1730  piperacillin-tazobactam (ZOSYN) IVPB 3.375 g  Status:  Discontinued     3.375 g 12.5 mL/hr over 240 Minutes Intravenous 3 times per day  02/10/16 1727 02/10/16 2214      Assessment/Plan: s/p Procedure(s): ABDOMINAL WOUND CLOSURE PERCUTANEOUS TRACHEOSTOMY Try to mobilize more and transfer to chair.  PM valve? And swallowing evaluation?  LOS: 44 days   Marta LamasJames O. Gae BonWyatt, III, MD, FACS [phone] Trauma Surgeon 03/24/2016

## 2016-03-24 NOTE — Progress Notes (Signed)
RT note- placed back to ventilator for possible hypoventilation. When placed back low VT 200ml, sp02 low 90's. Dr. Loletha CarrowWyattt aware, will continue to wean as tolerated.

## 2016-03-24 NOTE — Progress Notes (Signed)
RT note-This morning, increased RR, shallow respirations, with mild rhonchi through out. X-ray ordered, Dr. Lindie SpruceWyatt at bedside, placed back to full support at this time, attempted PSV, low VT. Patient is now up in chair with PT assistance using lift. Sat on the side of the bed as well prior to chair. Patient remains on full support at this time.

## 2016-03-25 LAB — PREPARE RBC (CROSSMATCH)

## 2016-03-25 LAB — BASIC METABOLIC PANEL
ANION GAP: 7 (ref 5–15)
BUN: 23 mg/dL — ABNORMAL HIGH (ref 6–20)
CALCIUM: 8.2 mg/dL — AB (ref 8.9–10.3)
CHLORIDE: 99 mmol/L — AB (ref 101–111)
CO2: 30 mmol/L (ref 22–32)
Creatinine, Ser: 0.59 mg/dL (ref 0.44–1.00)
GFR calc non Af Amer: >60 mL/min (ref >60)
Glucose, Bld: 172 mg/dL — ABNORMAL HIGH (ref 65–99)
POTASSIUM: 3.9 mmol/L (ref 3.5–5.1)
Sodium: 136 mmol/L (ref 135–145)

## 2016-03-25 LAB — CBC WITH DIFFERENTIAL/PLATELET
BASOS ABS: 0 10*3/uL (ref 0.0–0.1)
BASOS PCT: 0 %
Eosinophils Absolute: 1 10*3/uL — ABNORMAL HIGH (ref 0.0–0.7)
Eosinophils Relative: 6 %
HEMATOCRIT: 23.5 % — AB (ref 36.0–46.0)
HEMOGLOBIN: 7.1 g/dL — AB (ref 12.0–15.0)
Lymphocytes Relative: 6 %
Lymphs Abs: 1 10*3/uL (ref 0.7–4.0)
MCH: 29.7 pg (ref 26.0–34.0)
MCHC: 30.2 g/dL (ref 30.0–36.0)
MCV: 98.3 fL (ref 78.0–100.0)
Monocytes Absolute: 1.5 10*3/uL — ABNORMAL HIGH (ref 0.1–1.0)
Monocytes Relative: 9 %
NEUTROS ABS: 13.1 10*3/uL — AB (ref 1.7–7.7)
NEUTROS PCT: 79 %
Platelets: 116 10*3/uL — ABNORMAL LOW (ref 150–400)
RBC: 2.39 MIL/uL — AB (ref 3.87–5.11)
RDW: 17.5 % — ABNORMAL HIGH (ref 11.5–15.5)
WBC: 16.6 10*3/uL — ABNORMAL HIGH (ref 4.0–10.5)

## 2016-03-25 LAB — GLUCOSE, CAPILLARY
GLUCOSE-CAPILLARY: 152 mg/dL — AB (ref 65–99)
GLUCOSE-CAPILLARY: 126 mg/dL — AB (ref 65–99)
GLUCOSE-CAPILLARY: 141 mg/dL — AB (ref 65–99)
Glucose-Capillary: 142 mg/dL — ABNORMAL HIGH (ref 65–99)
Glucose-Capillary: 144 mg/dL — ABNORMAL HIGH (ref 65–99)
Glucose-Capillary: 147 mg/dL — ABNORMAL HIGH (ref 65–99)
Glucose-Capillary: 137 mg/dL — ABNORMAL HIGH (ref 65–99)

## 2016-03-25 MED ORDER — PRO-STAT SUGAR FREE PO LIQD
60.0000 mL | Freq: Two times a day (BID) | ORAL | Status: DC
  Administered 2016-03-25 – 2016-03-26 (×2): 60 mL
  Filled 2016-03-25 (×2): qty 60

## 2016-03-25 MED ORDER — ESCITALOPRAM OXALATE 20 MG PO TABS
20.0000 mg | ORAL_TABLET | Freq: Every day | ORAL | Status: DC
  Administered 2016-03-25 – 2016-04-01 (×8): 20 mg
  Filled 2016-03-25 (×2): qty 2
  Filled 2016-03-25: qty 1
  Filled 2016-03-25 (×2): qty 2
  Filled 2016-03-25: qty 1
  Filled 2016-03-25: qty 2

## 2016-03-25 MED ORDER — SODIUM CHLORIDE 0.9 % IV SOLN: Freq: Once | INTRAVENOUS | Status: DC

## 2016-03-25 MED ORDER — DEXTROSE 5 % IV SOLN
2.0000 g | Freq: Two times a day (BID) | INTRAVENOUS | Status: DC
  Administered 2016-03-25 (×2): 2 g via INTRAVENOUS
  Filled 2016-03-25 (×4): qty 2

## 2016-03-25 MED ORDER — PIVOT 1.5 CAL PO LIQD
1000.0000 mL | ORAL | Status: DC
Start: 2016-03-25 — End: 2016-03-26
  Administered 2016-03-25: 1000 mL

## 2016-03-25 MED ORDER — ACETAMINOPHEN 160 MG/5ML PO SOLN
650.0000 mg | Freq: Three times a day (TID) | ORAL | Status: DC | PRN
  Administered 2016-03-25 (×2): 650 mg via ORAL
  Filled 2016-03-25 (×2): qty 20.3

## 2016-03-25 MED ORDER — FENTANYL 50 MCG/HR TD PT72: 75.0000 ug | MEDICATED_PATCH | TRANSDERMAL | Status: DC

## 2016-03-25 NOTE — Progress Notes (Signed)
Trauma Service Note  Subjective: Patient grimacing with manipulation.  Up in chair on the ventilator yesterday.  Objective: Vital signs in last 24 hours: Temp:  [99.4 F (37.4 C)-102.2 F (39 C)] 102.2 F (39 C) (05/18 0800) Pulse Rate:  [100-114] 102 (05/18 0800) Resp:  [0-31] 0 (05/18 0800) BP: (91-126)/(46-76) 100/50 mmHg (05/18 0800) SpO2:  [96 %-100 %] 100 % (05/18 0800) FiO2 (%):  [40 %] 40 % (05/18 0800) Weight:  [129.9 kg (286 lb 6 oz)] 129.9 kg (286 lb 6 oz) (05/18 0500) Last BM Date: 03/24/16  Intake/Output from previous day: 05/17 0701 - 05/18 0700 In: 3120 [I.V.:1280; NG/GT:1840] Out: 2290 [Urine:2290] Intake/Output this shift: Total I/O In: 110 [I.V.:50; NG/GT:60] Out: 125 [Urine:125]  General: No acute distress.  Lungs: Clear.  Sats are okay.  Has not been on trach collar recently.  Decreased breath sounds on the right.  No CXR today.  Still significant restriction on the right side on CXR  Abd: Benign and the wound looks excellent.  Has one small area in the upper wound that has some entrapped fatty tissues the has not granulated, but it is only 1.5 cm in size.  Tolerating tube feedings well.  Extremities: No changes  Neuro: Intact  Lab Results: CBC   Recent Labs  03/24/16 0431 03/25/16 0415  WBC 16.0* 16.6*  HGB 7.8* 7.1*  HCT 26.8* 23.5*  PLT 135* 116*   BMET  Recent Labs  03/24/16 0431 03/25/16 0415  NA 129* 136  K 5.4* 3.9  CL 95* 99*  CO2 27 30  GLUCOSE 397* 172*  BUN 16 23*  CREATININE 0.53 0.59  CALCIUM 7.7* 8.2*   PT/INR No results for input(s): LABPROT, INR in the last 72 hours. ABG No results for input(s): PHART, HCO3 in the last 72 hours.  Invalid input(s): PCO2, PO2  Studies/Results: Dg Chest Port 1 View  03/24/2016  CLINICAL DATA:  Right lung consolidation EXAM: PORTABLE CHEST 1 VIEW COMPARISON:  03/23/2016 FINDINGS: Cardiomegaly again noted. Stable endotracheal tube position. Right arm PICC line is unchanged in  position. Persistent loculated right pleural effusion or hemo thorax and right lung consolidation. No convincing pulmonary edema. Right upper rib fractures again noted. IMPRESSION: Stable endotracheal tube position. Persistent loculated right pleural effusion or hemo thorax and right lung consolidation. No pulmonary edema. Right rib fractures again noted. Electronically Signed   By: Natasha Mead M.D.   On: 03/24/2016 09:08   Dg Chest Port 1 View  03/23/2016  CLINICAL DATA:  Hemothorax right. EXAM: PORTABLE CHEST 1 VIEW COMPARISON:  03/20/2016 FINDINGS: Tracheostomy remains in good position. Progressive opacity overlying the right chest. This is due to a combination of airspace disease and pleural effusion/hemothorax. Multiple displaced right rib fractures noted. The pleural effusion appears to have progressed. There is also progression of airspace disease on the right Diffuse airspace disease also present on the left and unchanged. No significant left effusion. Right arm PICC tip in the SVC. Mediastinal shift to the left due to right-sided effusion. IMPRESSION: Progressive right hemothorax and right airspace disease. CT may be helpful for follow-up to evaluate the size of the hemothorax. No chest tube in place. No pneumothorax. Diffuse bilateral airspace disease may be due to underlying pneumonia or ARDS. Electronically Signed   By: Marlan Palau M.D.   On: 03/23/2016 09:02    Anti-infectives: Anti-infectives    Start     Dose/Rate Route Frequency Ordered Stop   03/08/16 1400  levofloxacin (LEVAQUIN) IVPB 750  mg     750 mg 100 mL/hr over 90 Minutes Intravenous Every 24 hours 03/08/16 0912 03/10/16 1501   03/06/16 1400  ceFEPIme (MAXIPIME) 2 g in dextrose 5 % 50 mL IVPB  Status:  Discontinued     2 g 100 mL/hr over 30 Minutes Intravenous Every 12 hours 03/06/16 1344 03/08/16 0912   03/01/16 1330  ceFAZolin (ANCEF) IVPB 2g/100 mL premix     2 g 200 mL/hr over 30 Minutes Intravenous To ShortStay  Surgical 02/29/16 0917 03/01/16 1452   02/11/16 0600  piperacillin-tazobactam (ZOSYN) IVPB 3.375 g  Status:  Discontinued     3.375 g 100 mL/hr over 30 Minutes Intravenous 4 times per day 02/10/16 2214 02/22/16 0853   02/10/16 1730  piperacillin-tazobactam (ZOSYN) IVPB 3.375 g  Status:  Discontinued     3.375 g 12.5 mL/hr over 240 Minutes Intravenous 3 times per day 02/10/16 1727 02/10/16 2214      Assessment/Plan: s/p Procedure(s): ABDOMINAL WOUND CLOSURE PERCUTANEOUS TRACHEOSTOMY Continue current management.  Try to wean to trach collar.  CXR tomorrow.  Will start empiric cefepime fro fever, GNR on sputum check, fever. and leukocytosis.  Awaiting specific organism.  Will give one unit of blood.  LOS: 45 days   Marta LamasJames O. Gae BonWyatt, III, MD, FACS [phone] Trauma Surgeon 03/25/2016

## 2016-03-25 NOTE — Progress Notes (Signed)
Pt did not tolerate wean on trach collar or PSV. Pt placed on trach collar and had very shallow respirations and desats in low 80's. Pt became anxious with increased WOB, RT asked pt if she was having trouble breathing, pt nodded yes so RT placed back on vent. Once pt calmed down RT put on PSV and increased up to 20/5 with RR in 40's and low volumes. Placed back to full support, RN and CNA at bedside.

## 2016-03-25 NOTE — Progress Notes (Signed)
Physical Therapy Treatment Patient Details Name: [name] Bell MRN: [id number] DOB: July 31, 1981 Today's Date: 03/25/2016    History of Present Illness [name] Bell is a 35 y.o. female.She is obese. PMH unknown.Pt attempted suicide: taped DNR note to chest, drank Drano, then jumped off 5 story building ([address]).S/p 02/09/16 gel foam embolization of bil internal iliac arteries.S/p 02/09/16 ex lap with cholecystectomy, resection SB and mesenteric repair, liver lac repair, application or wound vac.S/p 02/09/16 closed reduction of pelvic ring, external pelvic fixation.Also has right metacarpal fracture in splint. Pt intubated via trach.  Pelvic x- fixator pins removed at bedside on 03/23/16.      PT Comments    Pt with not as much active movement and assistance with mobility today (may have increased soreness from mobility yesterday), however, she does become much more alert (likely due to pain) when mobilizing to EOB.  Lifted OOB to recliner chair where she stayed for 1.5 hours per RN report (longer than yesterday).  PT will continue to follow.    Follow Up Recommendations  CIR     Equipment Recommendations  Wheelchair (measurements PT);Wheelchair cushion (measurements PT);Hospital bed;Other (comment) (hoyer lift, bariatric equipment needed)    Recommendations for Other Services Rehab consult     Precautions / Restrictions Precautions Precautions: Fall Precaution Comments: multiple abdominal wounds, trach, feeding tube Required Braces or Orthoses: Other Brace/Splint Other Brace/Splint: L wrist in splint, abdominal binder Restrictions Weight Bearing Restrictions: Yes LUE Weight Bearing: Weight bearing as tolerated RLE Weight Bearing: Weight bearing as tolerated (for transfers only, no gait) LLE Weight Bearing: Weight bearing as tolerated (for transfers only, no gait)    Mobility  Bed Mobility Overal bed mobility: Needs Assistance;+2 for physical assistance Bed Mobility:  Supine to Sit;Sit to Supine;Rolling Rolling: +2 for physical assistance;Total assist   Supine to sit: +2 for physical assistance;Total assist Sit to supine: +2 for physical assistance;Total assist (+3, one on each leg one at trunk)   General bed mobility comments: Very minimal assist of moving right leg today, less than yesterday  Transfers Overall transfer level: Needs assistance Equipment used:  (maxi sky)             General transfer comment: Pt returned to supine and rolled bil to get maxi sky lift pad under her today.  Lifted to chair and helped to reposition for comfort.  Used lift to get to chair so that pt could have the benefit of being in upright position to help her repiratory status.   Ambulation/Gait             General Gait Details: unable at this time.        Balance Overall balance assessment: Needs assistance Sitting-balance support: Feet supported;Bilateral upper extremity supported Sitting balance-[name]: Poor Sitting balance - Comments: Heavy posterior lean at times seems to be pushing backwards to relieve pain in abdomen and pelvis when brought forward.   Postural control: Posterior lean (posterior push)                          Cognition Arousal/Alertness: Awake/alert Behavior During Therapy: Restless;Anxious;Flat affect Overall Cognitive Status: Difficult to assess Area of Impairment: Attention;Following commands   Current Attention Level: Sustained   Following Commands: Follows one step commands consistently                   Pertinent Vitals/Pain Pain Assessment: Faces Pain Score: 10-Worst pain ever Faces Pain Scale: Hurts worst  Pain Location: while seated, difficult to localize Pain Descriptors / Indicators: Grimacing;Guarding Pain Intervention(s): Limited activity within patient's tolerance;Monitored during session;Repositioned           PT Goals (current goals can now be found in the care plan section)  Acute Rehab PT Goals Patient Stated Goal: to decrease pain Progress towards PT goals: Progressing toward goals    Frequency  Min 5X/week    PT Plan Current plan remains appropriate    Co-evaluation PT/OT/SLP Co-Evaluation/Treatment: Yes Reason for Co-Treatment: Complexity of the patient's impairments (multi-system involvement);For patient/therapist safety PT goals addressed during session: Mobility/safety with mobility;Balance;Strengthening/ROM OT goals addressed during session: Strengthening/ROM     End of Session Equipment Utilized During Treatment: Oxygen;Other (comment) (vent, maxi sky) Activity Tolerance: Patient limited by pain Patient left: in chair;with call bell/phone within reach;with nursing/sitter in room     Time: [id number] PT Time Calculation (min) (ACUTE ONLY): 36 min  Charges:  $Therapeutic Activity: 8-22 mins                     Georgetta Crafton B. Cammi Consalvo, PT, DPT [phone]   03/25/2016, 5:16 PM

## 2016-03-25 NOTE — Progress Notes (Signed)
Occupational Therapy Treatment Patient Details Name: [name] BobKatia D Leitch MRN: [id number] DOB: 1980-11-15 Today's Date: 03/25/2016    History of present illness [name] Bell is a 35 y.o. female.She is obese. PMH unknown.Pt attempted suicide: taped DNR note to chest, drank Drano, then jumped off 5 story building ([address] [address] [employer]S/p 02/09/16 gel foam embolization of bil internal iliac arteries.S/p 02/09/16 ex lap with cholecystectomy, resection SB and mesenteric repair, liver lac repair, application or wound vac.S/p 02/09/16 closed reduction of pelvic ring, external pelvic fixation.Also has right metacarpal fracture in splint. Pt intubated via trach.  Pelvic x- fixator pins removed at bedside on 03/23/16.     OT comments  Pt sat EOB x for less than 10 mins with indication of pain.  Pt very restless and pushing posteriorly.  She was then transferred to recliner using lift.  Pt progressing with activity tolerance.   Follow Up Recommendations  LTACH;Supervision/Assistance - 24 hour    Equipment Recommendations  None recommended by OT    Recommendations for Other Services Other (comment)    Precautions / Restrictions Precautions Precautions: Fall (Sternal precautions not listed in orders ) Precaution Comments: multiple abdominal wounds, trach, feeding tube Required Braces or Orthoses: Other Brace/Splint Other Brace/Splint: L wrist in splint, abdominal binder Restrictions Weight Bearing Restrictions: Yes LUE Weight Bearing: Weight bearing as tolerated RLE Weight Bearing: Weight bearing as tolerated (transfers only) LLE Weight Bearing: Weight bearing as tolerated (transfers only)       Mobility Bed Mobility Overal bed mobility: Needs Assistance;+2 for physical assistance Bed Mobility: Supine to Sit;Sit to Supine Rolling: Total assist;+2 for physical assistance   Supine to sit: Total assist;+2 for physical assistance Sit to supine: Total assist;+2 for physical assistance   General bed  mobility comments: Pt is able to assist minimally with moving LEs off EOB and with lifting trunk.  Three person assist utilized for movement and management of lines   Transfers Overall transfer level: Needs assistance Equipment used:  (maxi sky)                  Balance Overall balance assessment: Needs assistance Sitting-balance support: Bilateral upper extremity supported;Feet supported Sitting balance-[name]: Poor Sitting balance - Comments: Pt able to sit EOB with max A for < 10 mins. Pt very restless with heavy posterior lean  Postural control: Posterior lean                         ADL Overall ADL's : Needs assistance/impaired                         Toilet Transfer: Total assistance Toilet Transfer Details (indicate cue type and reason): Pt lifted to recliner using maxi sky                   Vision                     Perception     Praxis      Cognition   Behavior During Therapy: Restless;Anxious;Flat affect Overall Cognitive Status: Difficult to assess Area of Impairment: Attention;Following commands   Current Attention Level: Sustained    Following Commands: Follows one step commands consistently            Extremity/Trunk Assessment               Exercises     Shoulder Instructions  General Comments      Pertinent Vitals/ Pain       Pain Assessment: Faces Pain Score: 10-Worst pain ever Faces Pain Scale: Hurts worst Pain Location: while seated.  Pt unable to specify, but does not "yes" when asked if it hurts all over  Pain Descriptors / Indicators: Grimacing;Guarding;Restless Pain Intervention(s): Monitored during session;Repositioned;Limited activity within patient's tolerance  Home Living                                          Prior Functioning/Environment              Frequency Min 3X/week     Progress Toward Goals  OT Goals(current goals can now be  found in the care plan section)  Progress towards OT goals: Progressing toward goals  ADL Goals Additional ADL Goal #1: pt will follow 1 step command 4 out 5 attempts Additional ADL Goal #2: Pt will tolerate EOB for 70m inutes as precursor to adls Additional ADL Goal #3: Pt will demonstrates focused attention to adl task  Plan Frequency needs to be updated    Co-evaluation    PT/OT/SLP Co-Evaluation/Treatment: Yes Reason for Co-Treatment: Complexity of the patient's impairments (multi-system involvement)   OT goals addressed during session: Strengthening/ROM      End of Session Equipment Utilized During Treatment: Other (comment) (ventilator )   Activity Tolerance Patient limited by pain   Patient Left in chair;with call bell/phone within reach;with nursing/sitter in room   Nurse Communication Mobility status;Need for lift equipment        Time: 3244-0102 OT Time Calculation (min): 40 min  Charges: OT General Charges $OT Visit: 1 Procedure OT Treatments $Therapeutic Activity: 8-22 mins  Dreyton Roessner M 03/25/2016, 3:17 PM

## 2016-03-25 NOTE — Progress Notes (Signed)
Nutrition Follow-up  DOCUMENTATION CODES:   Obesity unspecified  INTERVENTION:   Decrease Pivot 1.5 to 40 ml/hr Increase Prostat to 60 ml BID Provides: 1840 kcal, 150 grams protein, and 728 ml H2O.  Total free water: 1328 ml   NUTRITION DIAGNOSIS:   Inadequate oral intake related to inability to eat as evidenced by NPO status. Ongoing.   GOAL:   Provide needs based on ASPEN/SCCM guidelines Met.   MONITOR:   Vent status, Labs, Weight trends, Skin, I & O's, TF tolerance  ASSESSMENT:   Pt from [place] with no past medical hx admitted after suicide attempt by ingestion of Drano, 2 bottles of acetaminophen, nyquil, and possibly 6 alprazolam pills and jumping from 5th floor balcony of hotel. 4/3 pt is s/p exp lap, hepatorraphy, SBR, cholecystectomy, preperitoneal pelvic packing, ex fix pelvis, R HPTX, Bil pulmonary contusions. Plan for repeat OR 4/5 for another possible SBR. Abdomen remains open.   Patient is currently intubated on ventilator support MV: 9.8 L/min Temp (24hrs), Avg:100.9 F (38.3 C), Min:99.7 F (37.6 C), Max:102.2 F (39 C)  Pt back on vent but planning to wean.  Medications reviewed and include: ferrous sulfate Free water: 200 ml every 8 hours = 600 ml CBG's: 147-152 Now febrile  Diet Order:  Diet NPO time specified  Skin:  Wound (see comment) (incisions)  Last BM:  5/17  Height:   Ht Readings from Last 1 Encounters:  03/18/16 5' 11"  (1.803 m)    Weight:   Wt Readings from Last 1 Encounters:  03/25/16 286 lb 6 oz (129.9 kg)    Ideal Body Weight:  70.4 kg  BMI:  Body mass index is 39.96 kg/(m^2).  Estimated Nutritional Needs:   Kcal:  1800  Protein:  130-150 grams  Fluid:  >2 L/day  EDUCATION NEEDS:   No education needs identified at this time  [place], [place], [place] Pager [phone] After Hours Pager

## 2016-03-26 ENCOUNTER — Encounter (HOSPITAL_COMMUNITY): Payer: Self-pay | Admitting: General Surgery

## 2016-03-26 ENCOUNTER — Inpatient Hospital Stay (HOSPITAL_COMMUNITY): Payer: Medicaid Other

## 2016-03-26 LAB — GLUCOSE, CAPILLARY
GLUCOSE-CAPILLARY: 127 mg/dL — AB (ref 65–99)
GLUCOSE-CAPILLARY: 111 mg/dL — AB (ref 65–99)
GLUCOSE-CAPILLARY: 111 mg/dL — AB (ref 65–99)
GLUCOSE-CAPILLARY: 124 mg/dL — AB (ref 65–99)
Glucose-Capillary: 146 mg/dL — ABNORMAL HIGH (ref 65–99)
Glucose-Capillary: 126 mg/dL — ABNORMAL HIGH (ref 65–99)

## 2016-03-26 LAB — COMPREHENSIVE METABOLIC PANEL
ALT: 28 U/L (ref 14–54)
ANION GAP: 7 (ref 5–15)
AST: 37 U/L (ref 15–41)
Albumin: 1.3 g/dL — ABNORMAL LOW (ref 3.5–5.0)
Alkaline Phosphatase: 139 U/L — ABNORMAL HIGH (ref 38–126)
BILIRUBIN TOTAL: 3 mg/dL — AB (ref 0.3–1.2)
BUN: 26 mg/dL — ABNORMAL HIGH (ref 6–20)
CALCIUM: 8.1 mg/dL — AB (ref 8.9–10.3)
CO2: 29 mmol/L (ref 22–32)
Chloride: 104 mmol/L (ref 101–111)
Creatinine, Ser: 0.52 mg/dL (ref 0.44–1.00)
Glucose, Bld: 131 mg/dL — ABNORMAL HIGH (ref 65–99)
POTASSIUM: 3.6 mmol/L (ref 3.5–5.1)
Sodium: 140 mmol/L (ref 135–145)
TOTAL PROTEIN: 5.7 g/dL — AB (ref 6.5–8.1)

## 2016-03-26 LAB — CULTURE, RESPIRATORY W GRAM STAIN: Special Requests: NORMAL

## 2016-03-26 MED ORDER — QUETIAPINE FUMARATE 25 MG PO TABS
25.0000 mg | ORAL_TABLET | Freq: Three times a day (TID) | ORAL | Status: DC
  Administered 2016-03-26 – 2016-04-01 (×18): 25 mg
  Filled 2016-03-26 (×21): qty 1

## 2016-03-26 MED ORDER — DEXTROSE 5 % IV SOLN
2.0000 g | INTRAVENOUS | Status: DC
  Administered 2016-03-26 – 2016-03-30 (×5): 2 g via INTRAVENOUS
  Filled 2016-03-26 (×6): qty 2

## 2016-03-26 MED ORDER — SODIUM CHLORIDE 0.45 % IV SOLN
INTRAVENOUS | Status: DC
  Administered 2016-03-26: 09:00:00 via INTRAVENOUS
  Administered 2016-03-27: 1000 mL via INTRAVENOUS
  Administered 2016-03-28: 10:00:00 via INTRAVENOUS
  Administered 2016-03-29: 50 mL via INTRAVENOUS
  Administered 2016-03-30: 50 mL/h via INTRAVENOUS
  Administered 2016-03-31 – 2016-04-07 (×6): via INTRAVENOUS
  Administered 2016-04-09: 1 mL via INTRAVENOUS
  Administered 2016-04-09: via INTRAVENOUS

## 2016-03-26 MED ORDER — FENTANYL 25 MCG/HR TD PT72
50.0000 ug | MEDICATED_PATCH | TRANSDERMAL | Status: AC
  Administered 2016-03-26 – 2016-04-01 (×3): 50 ug via TRANSDERMAL
  Filled 2016-03-26 (×2): qty 1
  Filled 2016-03-26: qty 2

## 2016-03-26 MED ORDER — PIVOT 1.5 CAL PO LIQD
1000.0000 mL | ORAL | Status: DC
  Administered 2016-03-26 – 2016-03-29 (×4): 1000 mL

## 2016-03-26 MED ORDER — PRO-STAT SUGAR FREE PO LIQD
60.0000 mL | Freq: Two times a day (BID) | ORAL | Status: DC
  Administered 2016-03-26 – 2016-03-30 (×8): 60 mL
  Filled 2016-03-26 (×8): qty 60

## 2016-03-26 MED ORDER — CLONAZEPAM 1 MG PO TABS
1.0000 mg | ORAL_TABLET | Freq: Two times a day (BID) | ORAL | Status: DC
  Administered 2016-03-26 – 2016-04-01 (×11): 1 mg
  Filled 2016-03-26 (×11): qty 1

## 2016-03-26 NOTE — Progress Notes (Signed)
Physical Therapy Treatment Patient Details Name: [name] Bell MRN: [id number] DOB: 03-28-1981 Today's Date: 03/26/2016    History of Present Illness [name] Bell is a 35 y.o. female.She is obese. PMH unknown.Pt attempted suicide: taped DNR note to chest, drank Drano, then jumped off 5 story building ([address]).S/p 02/09/16 gel foam embolization of bil internal iliac arteries.S/p 02/09/16 ex lap with cholecystectomy, resection SB and mesenteric repair, liver lac repair, application or wound vac.S/p 02/09/16 closed reduction of pelvic ring, external pelvic fixation.Also has right metacarpal fracture in splint. Pt intubated via trach.  Pelvic x- fixator pins removed at bedside on 03/23/16.      PT Comments    Pt continues to have very painful reaction to sitting EOB.  She did attempt to help move her legs to EOB today.  We were unable to lift her OOB to chair as her lift pad was soiled after last session and there are no more on the unit.  One being ordered now.  Pt is not progressing quickly and we may need to re-assess our frequency next week.  Also, would she be appropriate for LTAC placement with transition to CIR?  PT will follow up on Monday 03/29/16.    Follow Up Recommendations  CIR     Equipment Recommendations  Wheelchair (measurements PT);Wheelchair cushion (measurements PT);Hospital bed;Other (comment) (hoyer lift, bariatric equipment needed. )    Recommendations for Other Services   NA     Precautions / Restrictions Precautions Precautions: Fall Precaution Comments: multiple abdominal wounds, trach, feeding tube Required Braces or Orthoses: Other Brace/Splint Other Brace/Splint: L wrist in splint, abdominal binder Restrictions LUE Weight Bearing: Weight bearing as tolerated RLE Weight Bearing: Weight bearing as tolerated (for stand pivot ony, no gait) LLE Weight Bearing: Weight bearing as tolerated (for stand pivot only, no gait)    Mobility  Bed  Mobility Overal bed mobility: Needs Assistance;+2 for physical assistance Bed Mobility: Rolling;Supine to Sit;Sit to Supine Rolling: +2 for physical assistance;Total assist   Supine to sit: +2 for physical assistance;Total assist Sit to supine: +2 for physical assistance;Total assist (+3 to help manage legs)   General bed mobility comments: Pt does attempt to move bil legs towards EOB with assist from PT, still flings one if not both legs out of bed on her own throughout the day.  Unable to help lift legs back into bed.    Transfers                 General transfer comment: Unable to lift OOB to chair as her last lift pad was soiled and there are no more on the floor.    Ambulation/Gait             General Gait Details: unable at this time.           Balance Overall balance assessment: Needs assistance Sitting-balance support: Feet supported;Bilateral upper extremity supported;Single extremity supported Sitting balance-[name]: Poor Sitting balance - Comments: Poor to zero depending on how long we are EOB.  The more time EOB the more pt pushes posteriorly to avoid pain and attempt to get back to supine in the bed.   Postural control: Posterior lean (pushing)                          Cognition Arousal/Alertness: Awake/alert (when EOB and in pain) Behavior During Therapy: Flat affect;Anxious Overall Cognitive Status: Difficult to assess     Current Attention  Level: Sustained   Following Commands: Follows one step commands consistently       General Comments: Pt seems to be more cognitively with it and aware despite inability to communicate.  She can mouth some words and answers yes/no questions appropriately.  When PT told her to "hang in there" at the end of the session, pt distinctly shook her head no.             Pertinent Vitals/Pain Pain Assessment: Faces Faces Pain Scale: Hurts worst Pain Location: bil groins Pain Descriptors /  Indicators: Crying;Grimacing;Guarding Pain Intervention(s): Limited activity within patient's tolerance;Monitored during session;Repositioned           PT Goals (current goals can now be found in the care plan section) Acute Rehab PT Goals Patient Stated Goal: to decrease pain Progress towards PT goals: Progressing toward goals    Frequency  Min 5X/week    PT Plan Current plan remains appropriate       End of Session Equipment Utilized During Treatment: Oxygen (vent) Activity Tolerance: Patient limited by pain Patient left: in bed;with call bell/phone within reach;with nursing/sitter in room     Time: 4098-1191 PT Time Calculation (min) (ACUTE ONLY): 30 min  Charges:  $Therapeutic Activity: 23-37 mins                      Romuald Mccaslin B. Euleta Belson, PT, DPT [phone]   03/26/2016, 2:39 PM

## 2016-03-26 NOTE — Consult Note (Signed)
Chief Complaint: fall from building with right pleural effusion  Referring Physician:Dr. Tharon Aquas Trigt  Supervising Physician: Sandi Mariscal  Patient Status: In-pt  HPI: [name] Bell is an 35 y.o. female who was admitted about 6 weeks ago after a tylenol overdose, caustic intake, and a suicide jump attempt from a 5 store floor.  She has multiple injuries.  She is currently trached and vented.  She also has a PEG in place getting tube feeds.  She has a right pleural effusion that has had a couple of different chest tubes placed and removed.  She has a recurrent right pleural effusion.  TCTS is following the patient and has asked if we could place a CT guided chest drain.  Past Medical History: History reviewed. No pertinent past medical history.  Past Surgical History:  Past Surgical History  Procedure Laterality Date  . Esophagogastroduodenoscopy N/A 02/10/2016    Procedure: ESOPHAGOGASTRODUODENOSCOPY (EGD);  Surgeon: Doran Stabler, MD;  Location: [facility] ENDOSCOPY;  Service: Endoscopy;  Laterality: N/A;  . Laparotomy N/A 02/10/2016    Procedure: EXPLORATORY LAPAROTOMY, removal of packs,  cauterization of liver, repacking of liver, and open abdomen vac application;  Surgeon: Georganna Skeans, MD;  Location: [place];  Service: General;  Laterality: N/A;  . Laparotomy N/A 02/11/2016    Procedure: EXPLORATORY LAPAROTOMY VAC CHANGE ;  Surgeon: Judeth Horn, MD;  Location: [place];  Service: General;  Laterality: N/A;  . Chest tube insertion Right 02/11/2016    Procedure: CHEST TUBE INSERTION;  Surgeon: Judeth Horn, MD;  Location: [place];  Service: General;  Laterality: Right;  . Laparotomy N/A 02/13/2016    Procedure: EXPLORATORY LAPAROTOMY, REMOVAL OF PACKS, ABDOMINAL VAC DRESSING CHANGE;  Surgeon: Georganna Skeans, MD;  Location: [place];  Service: General;  Laterality: N/A;  . Laparotomy N/A 02/18/2016    Procedure: EXPLORATORY LAPAROTOMY, PLACEMENT OF ABRA ABDOMINAL WALL CLOSURE SET;  Surgeon: Judeth Horn,  MD;  Location: [place];  Service: General;  Laterality: N/A;  . Chest tube insertion Right 02/26/2016    Procedure: CHEST TUBE INSERTION;  Surgeon: Ivin Poot, MD;  Location: [place];  Service: Thoracic;  Laterality: Right;  . Wound debridement N/A 03/01/2016    Procedure: ABDOMINAL WOUND CLOSURE;  Surgeon: Judeth Horn, MD;  Location: [place];  Service: General;  Laterality: N/A;  . Percutaneous tracheostomy N/A 03/01/2016    Procedure: PERCUTANEOUS TRACHEOSTOMY;  Surgeon: Judeth Horn, MD;  Location: [place];  Service: General;  Laterality: N/A;  . Laparotomy N/A 02/09/2016    Procedure: EXPLORATORY LAPAROTOMY;  Surgeon: Judeth Horn, MD;  Location: [place];  Service: General;  Laterality: N/A;  . Cholecystectomy  02/09/2016    Procedure: CHOLECYSTECTOMY;  Surgeon: Judeth Horn, MD;  Location: [place];  Service: General;;  . Bowel resection  02/09/2016    Procedure: SMALL BOWEL RESECTION, MESENTERIC REPAIR;  Surgeon: Judeth Horn, MD;  Location: [place];  Service: General;;  . Application of wound vac  02/09/2016    Procedure: APPLICATION OF WOUND VAC;  Surgeon: Judeth Horn, MD;  Location: [place];  Service: General;;  . Laceration repair  02/09/2016    Procedure: REPAIR LIVER LACERATION;  Surgeon: Judeth Horn, MD;  Location: [place];  Service: General;;  . External fixation pelvis  02/09/2016    Procedure: EXTERNAL FIXATION PELVIS;  Surgeon: Altamese Springboro, MD;  Location: [place];  Service: Orthopedics;;  . Laparotomy N/A 02/16/2016    Procedure: EXPLORATORY LAPAROTOMY, ABDOMINAL [place];  Surgeon: Georganna Skeans,  MD;  Location: [place];  Service: General;  Laterality: N/A;  . Application of wound vac N/A 02/16/2016    Procedure: RE-APPLICATION OF WOUND VAC;  Surgeon: Georganna Skeans, MD;  Location: [place];  Service: General;  Laterality: N/A;  . Sacro-iliac pinning Right 02/16/2016    Procedure: [place];  Surgeon: Altamese Kandiyohi, MD;  Location: [place];  Service: Orthopedics;  Laterality: Right;    Family History:  History reviewed. No pertinent family history.  Social History:  has no tobacco, alcohol, and drug history on file.  Allergies: No Known Allergies  Medications:   Medication List    Notice    You have not been prescribed any medications.      ROS: unable to obtain secondary to sedated state on vent  Mallampati Score: MD Evaluation Airway: Other (comments) Airway comments: trach in place on vent Heart: WNL Heart  comments: tachycardic Abdomen: Other (comments) Abdomen comments: open midline wound, PEG tube in place Chest/ Lungs: WNL Chest/ lungs comments: rhonchi b/l, chest tube suonds ASA  Classification: 3  Physical Exam: BP 108/60 mmHg  Pulse 94  Temp(Src) 101.4 F (38.6 C) (Oral)  Resp 10  Ht 5' 11"  (1.803 m)  Wt 287 lb 7.7 oz (130.4 kg)  BMI 40.11 kg/m2  SpO2 100%  LMP  (LMP Unknown) Body mass index is 40.11 kg/(m^2). General: obese ill-appearing black female who is laying in bed sedated on vent HEENT: head is normocephalic, atraumatic.  Won't open her eyes for me, but tries.  Ears and nose without any masses or lesions.  Trach in place, on vent Heart: regular, rate, and rhythm.  Normal s1,s2. No obvious murmurs, gallops, or rubs noted.  Palpable radial and pedal pulses bilaterally Lungs: coarse breath sounds noted throughout.  On vent, nonlabored Abd: soft, obese, PEG tube in place in LUQ, open midline wound is clean and packed. MS: all 4 extremities are symmetrical with no cyanosis, clubbing, or edema, but she has a left wrist splint in place.  Psych: unable to assess secondary to vented state and somnolent today   Labs: Results for orders placed or performed during the hospital encounter of 02/09/16 (from the past 48 hour(s))  Glucose, capillary     Status: Abnormal   Collection Time: 03/24/16  3:22 PM  Result Value Ref Range   Glucose-Capillary 127 (H) 65 - 99 mg/dL  Glucose, capillary     Status: Abnormal   Collection Time: 03/24/16  7:33 PM  Result  Value Ref Range   Glucose-Capillary 129 (H) 65 - 99 mg/dL  Glucose, capillary     Status: Abnormal   Collection Time: 03/24/16 11:24 PM  Result Value Ref Range   Glucose-Capillary 142 (H) 65 - 99 mg/dL  Glucose, capillary     Status: Abnormal   Collection Time: 03/25/16  3:27 AM  Result Value Ref Range   Glucose-Capillary 144 (H) 65 - 99 mg/dL  CBC with Differential/Platelet     Status: Abnormal   Collection Time: 03/25/16  4:15 AM  Result Value Ref Range   WBC 16.6 (H) 4.0 - 10.5 K/uL   RBC 2.39 (L) 3.87 - 5.11 MIL/uL   Hemoglobin 7.1 (L) 12.0 - 15.0 g/dL   HCT 23.5 (L) 36.0 - 46.0 %   MCV 98.3 78.0 - 100.0 fL   MCH 29.7 26.0 - 34.0 pg   MCHC 30.2 30.0 - 36.0 g/dL   RDW 17.5 (H) 11.5 - 15.5 %   Platelets 116 (L) 150 - 400  K/uL    Comment: SPECIMEN CHECKED FOR CLOTS REPEATED TO VERIFY PLATELET COUNT CONFIRMED BY SMEAR    Neutrophils Relative % 79 %   Neutro Abs 13.1 (H) 1.7 - 7.7 K/uL   Lymphocytes Relative 6 %   Lymphs Abs 1.0 0.7 - 4.0 K/uL   Monocytes Relative 9 %   Monocytes Absolute 1.5 (H) 0.1 - 1.0 K/uL   Eosinophils Relative 6 %   Eosinophils Absolute 1.0 (H) 0.0 - 0.7 K/uL   Basophils Relative 0 %   Basophils Absolute 0.0 0.0 - 0.1 K/uL  Basic metabolic panel     Status: Abnormal   Collection Time: 03/25/16  4:15 AM  Result Value Ref Range   Sodium 136 135 - 145 mmol/L    Comment: DELTA CHECK NOTED   Potassium 3.9 3.5 - 5.1 mmol/L    Comment: DELTA CHECK NOTED NO VISIBLE HEMOLYSIS    Chloride 99 (L) 101 - 111 mmol/L   CO2 30 22 - 32 mmol/L   Glucose, Bld 172 (H) 65 - 99 mg/dL   BUN 23 (H) 6 - 20 mg/dL   Creatinine, Ser 0.59 0.44 - 1.00 mg/dL   Calcium 8.2 (L) 8.9 - 10.3 mg/dL   GFR calc non Af Amer >60 >60 mL/min   GFR calc Af Amer >60 >60 mL/min    Comment: (NOTE) The eGFR has been calculated using the CKD EPI equation. This calculation has not been validated in all clinical situations. eGFR's persistently <60 mL/min signify possible Chronic  Kidney Disease.    Anion gap 7 5 - 15  Glucose, capillary     Status: Abnormal   Collection Time: 03/25/16  7:42 AM  Result Value Ref Range   Glucose-Capillary 147 (H) 65 - 99 mg/dL  Prepare RBC     Status: None   Collection Time: 03/25/16  8:40 AM  Result Value Ref Range   Order Confirmation ORDER PROCESSED BY BLOOD BANK   Type and screen [place]     Status: None   Collection Time: 03/25/16  9:15 AM  Result Value Ref Range   ABO/RH(D) O POS    Antibody Screen NEG    Sample Expiration 03/28/2016    Unit Number [id number]    Blood Component Type RED CELLS,LR    Unit division 00    Status of Unit ISSUED,FINAL    Transfusion Status OK TO TRANSFUSE    Crossmatch Result Compatible   Glucose, capillary     Status: Abnormal   Collection Time: 03/25/16 11:31 AM  Result Value Ref Range   Glucose-Capillary 152 (H) 65 - 99 mg/dL  Glucose, capillary     Status: Abnormal   Collection Time: 03/25/16  3:46 PM  Result Value Ref Range   Glucose-Capillary 126 (H) 65 - 99 mg/dL  Glucose, capillary     Status: Abnormal   Collection Time: 03/25/16  7:49 PM  Result Value Ref Range   Glucose-Capillary 137 (H) 65 - 99 mg/dL  Glucose, capillary     Status: Abnormal   Collection Time: 03/25/16 11:23 PM  Result Value Ref Range   Glucose-Capillary 141 (H) 65 - 99 mg/dL  Glucose, capillary     Status: Abnormal   Collection Time: 03/26/16  3:28 AM  Result Value Ref Range   Glucose-Capillary 146 (H) 65 - 99 mg/dL  Comprehensive metabolic panel     Status: Abnormal   Collection Time: 03/26/16  3:46 AM  Result Value Ref Range   Sodium 140  135 - 145 mmol/L   Potassium 3.6 3.5 - 5.1 mmol/L   Chloride 104 101 - 111 mmol/L   CO2 29 22 - 32 mmol/L   Glucose, Bld 131 (H) 65 - 99 mg/dL   BUN 26 (H) 6 - 20 mg/dL   Creatinine, Ser 0.52 0.44 - 1.00 mg/dL   Calcium 8.1 (L) 8.9 - 10.3 mg/dL   Total Protein 5.7 (L) 6.5 - 8.1 g/dL   Albumin 1.3 (L) 3.5 - 5.0 g/dL   AST 37 15 - 41  U/L   ALT 28 14 - 54 U/L   Alkaline Phosphatase 139 (H) 38 - 126 U/L   Total Bilirubin 3.0 (H) 0.3 - 1.2 mg/dL   GFR calc non Af Amer >60 >60 mL/min   GFR calc Af Amer >60 >60 mL/min    Comment: (NOTE) The eGFR has been calculated using the CKD EPI equation. This calculation has not been validated in all clinical situations. eGFR's persistently <60 mL/min signify possible Chronic Kidney Disease.    Anion gap 7 5 - 15  Glucose, capillary     Status: Abnormal   Collection Time: 03/26/16  7:31 AM  Result Value Ref Range   Glucose-Capillary 127 (H) 65 - 99 mg/dL  Glucose, capillary     Status: Abnormal   Collection Time: 03/26/16 11:23 AM  Result Value Ref Range   Glucose-Capillary 111 (H) 65 - 99 mg/dL    Imaging: Dg Chest Port 1 View  03/26/2016  CLINICAL DATA:  Status post fall with significant chest trauma with multiple rib fractures and pulmonary contusions EXAM: PORTABLE CHEST 1 VIEW COMPARISON:  Portable chest x-ray of Mar 24, 2016 FINDINGS: The lungs are hypoinflated. The pulmonary interstitium remains largely opacified on the right and is more opacified than previously demonstrated on the left. Pleural fluid surrounds the convexity of the right lung. No pneumothorax is evident. The tracheostomy appliance tip projects at the level of the clavicular heads. The right subclavian venous catheter tip projects over the midportion of the SVC. Multiple right-sided rib fractures are demonstrated. IMPRESSION: Increasing opacity in the previously reasonably aerated left lung consistent with worsening interstitial edema or pneumonia. On the right there is persistent airspace opacification diffusely. Large amount of pleural fluid surrounding the convexity of the right lung is stable. Electronically Signed   By: David  Martinique M.D.   On: 03/26/2016 07:38    Assessment/Plan 1. Right pleural effusion, s/p multiple chest injuries after fall -I spoke to the patient's sister who consents for her.  I  explained the procedure along with the risks and complications.  She understands and is agreeable to proceed with right CT guided chest drain placement tomorrow. -hold TFs after MN tonight -check PT/INR in am Risks and Benefits discussed with the patient's sister including bleeding, infection, damage to adjacent structures, and sepsis. All of the patient's questions were answered, patient is agreeable to proceed. Consent signed and in chart.   Thank you for this interesting consult.  I greatly enjoyed meeting SANGITA ZANI and look forward to participating in their care.  A copy of this report was sent to the requesting provider on this date.  Electronically Signed: Henreitta Cea 03/26/2016, 11:53 AM   I spent a total of 30 minutes   in face to face in clinical consultation, greater than 50% of which was counseling/coordinating care for right pleural effusion, needs drain

## 2016-03-26 NOTE — Progress Notes (Signed)
Orthopaedic Trauma Service Progress Note  Subjective  Sleeping Therapy notes reviewed   ROS As above  Objective   BP 108/59 mmHg  Pulse 96  Temp(Src) 100.4 F (38 C) (Axillary)  Resp 21  Ht 5\' 11"  (1.803 m)  Wt 130.4 kg (287 lb 7.7 oz)  BMI 40.11 kg/m2  SpO2 100%  LMP  (LMP Unknown)  Intake/Output      05/18 0701 - 05/19 0700 05/19 0701 - 05/20 0700   I.V. (mL/kg) 1200 (9.2) 25 (0.2)   Blood 366    Other 240    NG/GT 1756.3 80   IV Piggyback 100    Total Intake(mL/kg) 3662.3 (28.1) 105 (0.8)   Urine (mL/kg/hr) 1425 (0.5) 125 (0.3)   Stool     Total Output 1425 125   Net +2237.3 -20           Exam  Gen: sleeping  Pelvis: pinsites look excellent, packing and dressings stable    Assessment and Plan   POD/HD#: 6 weeks post pelvic ex fix, 3 days post removal    -complex pelvic ring fx s/p ex fix and SI screws:                         Daily dressing changes- iodoform packing, 4x4 and tape              Wound beds looks stable             will recheck Monday or Tuesday                             Pt still bed to chair transfers only for another 4-6 weeks. She can pivot to assist with transfers               No formal motion restrictions   - Dispo:             Continue per TS     Mearl LatinKeith W. Tekeyah Santiago, PA-C [facility] [phone](P) [phone] (O) 03/26/2016 9:48 AM

## 2016-03-26 NOTE — Progress Notes (Signed)
Trauma Service Note  Subjective: Patient fairly sleepy this AM even after we held dose of Klonopin last night.  Objective: Vital signs in last 24 hours: Temp:  [98.5 F (36.9 C)-102.6 F (39.2 C)] 100.4 F (38 C) (05/19 0732) Pulse Rate:  [93-104] 96 (05/19 0700) Resp:  [0-23] 20 (05/19 0700) BP: (93-127)/(45-66) 113/62 mmHg (05/19 0700) SpO2:  [94 %-100 %] 99 % (05/19 0700) FiO2 (%):  [30 %-40 %] 30 % (05/19 0700) Weight:  [130.4 kg (287 lb 7.7 oz)] 130.4 kg (287 lb 7.7 oz) (05/19 0330) Last BM Date: 03/24/16  Intake/Output from previous day: 05/18 0701 - 05/19 0700 In: 3662.3 [I.V.:1200; Blood:366; NG/GT:1756.3; IV Piggyback:100] Out: 1425 [Urine:1425] Intake/Output this shift:    General: No acute distress.  Sleepy.  Not able to wean to trach collar yesterday.  Lungs: Slightly diminished on the right.  CXR shows continue right sided opacification.  Not sure it this would be amenable to ultrasound guided drainage or another chest tube.  Abd: Soft, tolerating tube feedings well.  Wound is healing.  Extremities: No changes  Neuro: Sleepy but has been following commands.  Lab Results: CBC   Recent Labs  03/24/16 0431 03/25/16 0415  WBC 16.0* 16.6*  HGB 7.8* 7.1*  HCT 26.8* 23.5*  PLT 135* 116*   BMET  Recent Labs  03/25/16 0415 03/26/16 0346  NA 136 140  K 3.9 3.6  CL 99* 104  CO2 30 29  GLUCOSE 172* 131*  BUN 23* 26*  CREATININE 0.59 0.52  CALCIUM 8.2* 8.1*   PT/INR No results for input(s): LABPROT, INR in the last 72 hours. ABG No results for input(s): PHART, HCO3 in the last 72 hours.  Invalid input(s): PCO2, PO2  Studies/Results: Dg Chest Port 1 View  03/24/2016  CLINICAL DATA:  Right lung consolidation EXAM: PORTABLE CHEST 1 VIEW COMPARISON:  03/23/2016 FINDINGS: Cardiomegaly again noted. Stable endotracheal tube position. Right arm PICC line is unchanged in position. Persistent loculated right pleural effusion or hemo thorax and right lung  consolidation. No convincing pulmonary edema. Right upper rib fractures again noted. IMPRESSION: Stable endotracheal tube position. Persistent loculated right pleural effusion or hemo thorax and right lung consolidation. No pulmonary edema. Right rib fractures again noted. Electronically Signed   By: Natasha Mead M.D.   On: 03/24/2016 09:08    Anti-infectives: Anti-infectives    Start     Dose/Rate Route Frequency Ordered Stop   03/25/16 1000  ceFEPIme (MAXIPIME) 2 g in dextrose 5 % 50 mL IVPB     2 g 100 mL/hr over 30 Minutes Intravenous Every 12 hours 03/25/16 0829     03/08/16 1400  levofloxacin (LEVAQUIN) IVPB 750 mg     750 mg 100 mL/hr over 90 Minutes Intravenous Every 24 hours 03/08/16 0912 03/10/16 1501   03/06/16 1400  ceFEPIme (MAXIPIME) 2 g in dextrose 5 % 50 mL IVPB  Status:  Discontinued     2 g 100 mL/hr over 30 Minutes Intravenous Every 12 hours 03/06/16 1344 03/08/16 0912   03/01/16 1330  ceFAZolin (ANCEF) IVPB 2g/100 mL premix     2 g 200 mL/hr over 30 Minutes Intravenous To [facility] 02/29/16 0917 03/01/16 1452   02/11/16 0600  piperacillin-tazobactam (ZOSYN) IVPB 3.375 g  Status:  Discontinued     3.375 g 100 mL/hr over 30 Minutes Intravenous 4 times per day 02/10/16 2214 02/22/16 0853   02/10/16 1730  piperacillin-tazobactam (ZOSYN) IVPB 3.375 g  Status:  Discontinued  3.375 g 12.5 mL/hr over 240 Minutes Intravenous 3 times per day 02/10/16 1727 02/10/16 2214      Assessment/Plan: s/p Procedure(s): ABDOMINAL WOUND CLOSURE PERCUTANEOUS TRACHEOSTOMY On empiric antibiotics for GNR PNA presumably.  further characterization is pending.  [facility]. Ventilator dependent, psychiatric patient with obesity and limited mobility--disposition problem Check CBC and Bmet tomorrow.  LOS: 46 days   Marta LamasJames O. Gae BonWyatt, III, MD, FACS [phone] Trauma Surgeon 03/26/2016

## 2016-03-26 NOTE — Progress Notes (Signed)
RT attempted to wean patient 3 different times and up to 15 on pressure support. RT will continue to monitor.

## 2016-03-26 NOTE — Consult Note (Signed)
SLP Cancellation Note  Patient Details Name: [name] Bell MRN: [id number] DOB: 04/23/1981   Cancelled treatment:        Unable to provide treatment for PMSV placement, as pt is currently resting on vent. Will continue efforts.  [name] Bell, [name] Bell 03/26/2016, 10:52 AM  [name] Bell, MSP, CCC-SLP [phone]

## 2016-03-26 NOTE — Progress Notes (Signed)
Noted PT's notes regarding possible LTAC.  [facility], she has no payor source, which [facility].  [facility] will not accept uninsured patients.    Quintella BatonJulie W. Ethal Gotay, RN, BSN  Trauma/Neuro ICU Case Manager [phone]

## 2016-03-27 ENCOUNTER — Inpatient Hospital Stay (HOSPITAL_COMMUNITY): Payer: Medicaid Other

## 2016-03-27 LAB — BASIC METABOLIC PANEL
Anion gap: 9 (ref 5–15)
BUN: 29 mg/dL — AB (ref 6–20)
CHLORIDE: 100 mmol/L — AB (ref 101–111)
CO2: 30 mmol/L (ref 22–32)
CREATININE: 0.53 mg/dL (ref 0.44–1.00)
Calcium: 8.4 mg/dL — ABNORMAL LOW (ref 8.9–10.3)
GFR calc Af Amer: >60 mL/min (ref >60)
GFR calc non Af Amer: >60 mL/min (ref >60)
Glucose, Bld: 104 mg/dL — ABNORMAL HIGH (ref 65–99)
Potassium: 3.7 mmol/L (ref 3.5–5.1)
Sodium: 139 mmol/L (ref 135–145)

## 2016-03-27 LAB — CBC
HCT: 22.5 % — ABNORMAL LOW (ref 36.0–46.0)
HEMOGLOBIN: 6.6 g/dL — AB (ref 12.0–15.0)
MCH: 28.4 pg (ref 26.0–34.0)
MCHC: 29.3 g/dL — ABNORMAL LOW (ref 30.0–36.0)
MCV: 97 fL (ref 78.0–100.0)
Platelets: 153 10*3/uL (ref 150–400)
RBC: 2.32 MIL/uL — AB (ref 3.87–5.11)
RDW: 17.6 % — ABNORMAL HIGH (ref 11.5–15.5)
WBC: 8 10*3/uL (ref 4.0–10.5)

## 2016-03-27 LAB — PROTIME-INR
INR: 1.52 — ABNORMAL HIGH (ref 0.00–1.49)
Prothrombin Time: 18.3 seconds — ABNORMAL HIGH (ref 11.6–15.2)

## 2016-03-27 LAB — GLUCOSE, CAPILLARY
GLUCOSE-CAPILLARY: 81 mg/dL (ref 65–99)
GLUCOSE-CAPILLARY: 102 mg/dL — AB (ref 65–99)
Glucose-Capillary: 91 mg/dL (ref 65–99)
Glucose-Capillary: 88 mg/dL (ref 65–99)
Glucose-Capillary: 102 mg/dL — ABNORMAL HIGH (ref 65–99)

## 2016-03-27 LAB — PREPARE RBC (CROSSMATCH)

## 2016-03-27 MED ORDER — MIDAZOLAM HCL 2 MG/2ML IJ SOLN
INTRAMUSCULAR | Status: AC
  Filled 2016-03-27: qty 4

## 2016-03-27 MED ORDER — FENTANYL CITRATE (PF) 100 MCG/2ML IJ SOLN
INTRAMUSCULAR | Status: AC | PRN
  Administered 2016-03-27 (×4): 25 ug via INTRAVENOUS

## 2016-03-27 MED ORDER — SODIUM CHLORIDE 0.9 % IV SOLN: Freq: Once | INTRAVENOUS | Status: DC

## 2016-03-27 MED ORDER — MIDAZOLAM HCL 2 MG/2ML IJ SOLN
INTRAMUSCULAR | Status: AC | PRN
  Administered 2016-03-27 (×4): 0.5 mg via INTRAVENOUS
  Administered 2016-03-27: 1 mg via INTRAVENOUS

## 2016-03-27 MED ORDER — BACITRACIN-NEOMYCIN-POLYMYXIN 400-5-5000 EX OINT
TOPICAL_OINTMENT | CUTANEOUS | Status: AC
  Filled 2016-03-27: qty 3

## 2016-03-27 MED ORDER — FENTANYL CITRATE (PF) 100 MCG/2ML IJ SOLN
INTRAMUSCULAR | Status: AC
  Filled 2016-03-27: qty 4

## 2016-03-27 MED ORDER — LIDOCAINE-EPINEPHRINE 1 %-1:100000 IJ SOLN
INTRAMUSCULAR | Status: AC
  Filled 2016-03-27: qty 1

## 2016-03-27 NOTE — Procedures (Signed)
Technically successful CT guided placed of a 12 Fr drainage catheter placement into the basilar aspect of the right pleural space yielding 20 cc of thick dark red non-foul smelling blood products.   All aspirated samples sent to the laboratory for analysis.   EBL: None No immediate post procedural complications.   Katherina RightJay Florrie Ramires, MD Pager #: [phone]

## 2016-03-27 NOTE — Progress Notes (Signed)
Trauma Service Note  Subjective: Hemoglobin drop to 6.6 this morning  Objective: Vital signs in last 24 hours: Temp:  [98.8 F (37.1 C)-100.5 F (38.1 C)] 100.4 F (38 C) (05/20 0758) Pulse Rate:  [84-110] 89 (05/20 1210) Resp:  [0-27] 20 (05/20 1210) BP: (93-127)/(47-70) 117/70 mmHg (05/20 1210) SpO2:  [95 %-100 %] 100 % (05/20 1210) FiO2 (%):  [30 %] 30 % (05/20 0800) Weight:  [131.9 kg (290 lb 12.6 oz)] 131.9 kg (290 lb 12.6 oz) (05/20 0500) Last BM Date: 03/26/16  Intake/Output from previous day: 05/19 0701 - 05/20 0700 In: 2075 [I.V.:975; NG/GT:1050; IV Piggyback:50] Out: 1865 [Urine:1465] Intake/Output this shift: Total I/O In: 150 [I.V.:150] Out: 150 [Urine:150]  General: intubated, sedated  Lungs: coarse b/l, right side draining serous fluid  Abd: soft, NT, ND  Extremities: no edema  Lab Results: CBC   Recent Labs  03/25/16 0415 03/27/16 0600  WBC 16.6* 8.0  HGB 7.1* 6.6*  HCT 23.5* 22.5*  PLT 116* 153   BMET  Recent Labs  03/26/16 0346 03/27/16 0445  NA 140 139  K 3.6 3.7  CL 104 100*  CO2 29 30  GLUCOSE 131* 104*  BUN 26* 29*  CREATININE 0.52 0.53  CALCIUM 8.1* 8.4*   PT/INR  Recent Labs  03/27/16 0445  LABPROT 18.3*  INR 1.52*   ABG No results for input(s): PHART, HCO3 in the last 72 hours.  Invalid input(s): PCO2, PO2  Studies/Results: No results found.  Anti-infectives: Anti-infectives    Start     Dose/Rate Route Frequency Ordered Stop   03/26/16 1030  cefTRIAXone (ROCEPHIN) 2 g in dextrose 5 % 50 mL IVPB     2 g 100 mL/hr over 30 Minutes Intravenous Every 24 hours 03/26/16 1023     03/25/16 1000  ceFEPIme (MAXIPIME) 2 g in dextrose 5 % 50 mL IVPB  Status:  Discontinued     2 g 100 mL/hr over 30 Minutes Intravenous Every 12 hours 03/25/16 0829 03/26/16 1023   03/08/16 1400  levofloxacin (LEVAQUIN) IVPB 750 mg     750 mg 100 mL/hr over 90 Minutes Intravenous Every 24 hours 03/08/16 0912 03/10/16 1501   03/06/16  1400  ceFEPIme (MAXIPIME) 2 g in dextrose 5 % 50 mL IVPB  Status:  Discontinued     2 g 100 mL/hr over 30 Minutes Intravenous Every 12 hours 03/06/16 1344 03/08/16 0912   03/01/16 1330  ceFAZolin (ANCEF) IVPB 2g/100 mL premix     2 g 200 mL/hr over 30 Minutes Intravenous To [facility] 02/29/16 0917 03/01/16 1452   02/11/16 0600  piperacillin-tazobactam (ZOSYN) IVPB 3.375 g  Status:  Discontinued     3.375 g 100 mL/hr over 30 Minutes Intravenous 4 times per day 02/10/16 2214 02/22/16 0853   02/10/16 1730  piperacillin-tazobactam (ZOSYN) IVPB 3.375 g  Status:  Discontinued     3.375 g 12.5 mL/hr over 240 Minutes Intravenous 3 times per day 02/10/16 1727 02/10/16 2214      Medications Scheduled Meds: . sodium chloride   Intravenous Once  . sodium chloride   Intravenous Once  . antiseptic oral rinse  7 mL Mouth Rinse QID  . cefTRIAXone (ROCEPHIN)  IV  2 g Intravenous Q24H  . chlorhexidine gluconate (SAGE KIT)  15 mL Mouth Rinse BID  . clonazePAM  1 mg Per Tube BID  . escitalopram  20 mg Per Tube Daily  . feeding supplement (PIVOT 1.5 CAL)  1,000 mL Per Tube Q24H  .  feeding supplement (PRO-STAT SUGAR FREE 64)  60 mL Per Tube BID  . fentaNYL  50 mcg Transdermal Q72H  . fentaNYL      . ferrous sulfate  300 mg Per Tube BID WC  . free water  200 mL Per Tube Q8H  . furosemide  40 mg Oral Daily  . guaiFENesin  15 mL Per Tube Q6H  . insulin aspart  0-20 Units Subcutaneous 6 times per day  . lidocaine-EPINEPHrine      . midazolam      . pantoprazole sodium  40 mg Per Tube Daily   Or  . pantoprazole (PROTONIX) IV  40 mg Intravenous Daily  . QUEtiapine  25 mg Per Tube TID  . sodium chloride flush  10-40 mL Intracatheter Q12H   Continuous Infusions: . sodium chloride 1,000 mL (03/27/16 0409)   PRN Meds:.acetaminophen (TYLENOL) oral liquid 160 mg/5 mL, bisacodyl, diphenhydrAMINE, fentaNYL, HYDROmorphone (DILAUDID) injection, ipratropium-albuterol, LORazepam, midazolam, ondansetron  **OR** ondansetron (ZOFRAN) IV, oxyCODONE, sodium chloride flush  Assessment/Plan: s/p Procedure(s): ABDOMINAL WOUND CLOSURE PERCUTANEOUS TRACHEOSTOMY -IR guided pleural drain today -continue to wean vent -continue trach care  LOS: 47 days   [place] Surgeon [phone] Surgery 03/27/2016

## 2016-03-28 ENCOUNTER — Inpatient Hospital Stay (HOSPITAL_COMMUNITY): Payer: Medicaid Other

## 2016-03-28 LAB — TYPE AND SCREEN
ABO/RH(D): O POS
ANTIBODY SCREEN: NEGATIVE
UNIT DIVISION: 0
Unit division: 0

## 2016-03-28 LAB — GLUCOSE, CAPILLARY
GLUCOSE-CAPILLARY: 98 mg/dL (ref 65–99)
GLUCOSE-CAPILLARY: 108 mg/dL — AB (ref 65–99)
Glucose-Capillary: 111 mg/dL — ABNORMAL HIGH (ref 65–99)
Glucose-Capillary: 114 mg/dL — ABNORMAL HIGH (ref 65–99)
Glucose-Capillary: 115 mg/dL — ABNORMAL HIGH (ref 65–99)
Glucose-Capillary: 132 mg/dL — ABNORMAL HIGH (ref 65–99)

## 2016-03-28 MED ORDER — ALTEPLASE 2 MG IJ SOLR
2.0000 mg | Freq: Once | INTRAMUSCULAR | Status: AC
  Administered 2016-03-28: 2 mg
  Filled 2016-03-28: qty 2

## 2016-03-28 NOTE — Progress Notes (Signed)
Referring Physician(s): Dr Franky MachoLuke Kinsinger Dr Lovett SoxPeter VanTrigt  Supervising Physician: Simonne ComeWatts, [name]  Patient Status: In-pt  Chief Complaint:  Right pleural effusion trauma  Subjective:  Rt chest tube drain placed 5/20 Bloody output Pt in ICU Still complains of pain   Allergies: Review of patient's allergies indicates no known allergies.  Medications: Prior to Admission medications   Not on File     Vital Signs: BP 118/67 mmHg  Pulse 88  Temp(Src) 100 F (37.8 C) (Oral)  Resp 22  Ht 5\' 11"  (1.803 m)  Wt 290 lb 5.5 oz (131.7 kg)  BMI 40.51 kg/m2  SpO2 100%  LMP  (LMP Unknown)  Physical Exam  Pulmonary/Chest:  trach  Abdominal: Soft.  Neurological: She is alert.  Skin: Skin is warm and dry.  Site of Rt Chest tube drain is clean and dry NT No bleeding Output 160 cc in pleurvac---bloody  Nursing note and vitals reviewed.   Imaging: Dg Chest Port 1 View  03/26/2016  CLINICAL DATA:  Status post fall with significant chest trauma with multiple rib fractures and pulmonary contusions EXAM: PORTABLE CHEST 1 VIEW COMPARISON:  Portable chest x-ray of Mar 24, 2016 FINDINGS: The lungs are hypoinflated. The pulmonary interstitium remains largely opacified on the right and is more opacified than previously demonstrated on the left. Pleural fluid surrounds the convexity of the right lung. No pneumothorax is evident. The tracheostomy appliance tip projects at the level of the clavicular heads. The right subclavian venous catheter tip projects over the midportion of the SVC. Multiple right-sided rib fractures are demonstrated. IMPRESSION: Increasing opacity in the previously reasonably aerated left lung consistent with worsening interstitial edema or pneumonia. On the right there is persistent airspace opacification diffusely. Large amount of pleural fluid surrounding the convexity of the right lung is stable. Electronically Signed   By: David  SwazilandJordan M.D.   On: 03/26/2016 07:38    Ct Image Guided Fluid Drain By Catheter  03/27/2016  INDICATION: History of suicide attempt by jumping from 5 story building with multiple injuries including recurrent right-sided hemothorax, post failed right-sided surgically placed chest tube placement. Request made for placement of a CT-guided right-sided chest tube. EXAM: CT IMAGE GUIDED FLUID DRAIN BY CATHETER COMPARISON:  Chest CT- 03/09/2016; chest radiograph - 03/26/2016; 03/24/2016; 03/23/2016 MEDICATIONS: The patient is currently admitted to the hospital and receiving intravenous antibiotics. The antibiotics were administered within an appropriate time frame prior to the initiation of the procedure. ANESTHESIA/SEDATION: Moderate (conscious) sedation was employed during this procedure. A total of Versed 3 mg and Fentanyl 100 mcg was administered intravenously. Moderate Sedation Time: 52 minutes. The patient's level of consciousness and vital signs were monitored continuously by radiology nursing throughout the procedure under my direct supervision. CONTRAST:  None COMPLICATIONS: None immediate. PROCEDURE: Informed written consent was obtained from the patient sister after a discussion of the risks, benefits and alternatives to treatment. The patient was placed supine, slightly LPO on the CT gantry and a pre procedural CT was performed re-demonstrating the markedly complex small to moderate size right-sided effusion which is noted to contain innumerable tiny foci of air. The procedure was planned. A timeout was performed prior to the initiation of the procedure. The skin overlying the right lateral chest was prepped and draped in the usual sterile fashion. The overlying soft tissues were anesthetized with 1% lidocaine with epinephrine. Appropriate trajectory was planned with the use of a 22 gauge spinal needle. An 18 gauge trocar needle was advanced into the  abscess/fluid collection and a short Amplatz super stiff wire was coiled within the collection.  Appropriate positioning was confirmed with a limited CT scan. The tract was serially dilated allowing placement of a 12 [place] all-purpose drainage catheter. Under intermittent CT guidance, the right-sided chest tube was slowly withdrawn into the basilar aspect of the right pleural space. Approximately 20 cc of thick, dark red non foul smelling blood products were aspirated. The tube was connected to a Pleur-vac device and sutured in place. A dressing was placed. The patient tolerated the procedure well without immediate post procedural complication. IMPRESSION: Successful CT guided placement of a 15 French all purpose drain catheter into the basilar aspect of the right pleural space with aspiration of 20 mL of thick, dark red, non foul smelling blood products. Samples were sent to the laboratory as requested by the ordering clinical team. Electronically Signed   By: Simonne Come M.D.   On: 03/27/2016 14:30    Labs:  CBC:  Recent Labs  03/22/16 0545 03/24/16 0431 03/25/16 0415 03/27/16 0600  WBC 11.5* 16.0* 16.6* 8.0  HGB 7.5* 7.8* 7.1* 6.6*  HCT 23.3* 26.8* 23.5* 22.5*  PLT 145* 135* 116* 153    COAGS:  Recent Labs  02/09/16 1040 02/09/16 1430  02/13/16 0600 02/13/16 1620 02/23/16 1926 02/24/16 0415 03/27/16 0445  INR 2.11* 1.99*  < > 2.04* 1.75*  --  1.47 1.52*  APTT 80* 63*  --  36  --  37  --   --   < > = values in this interval not displayed.  BMP:  Recent Labs  03/24/16 0431 03/25/16 0415 03/26/16 0346 03/27/16 0445  NA 129* 136 140 139  K 5.4* 3.9 3.6 3.7  CL 95* 99* 104 100*  CO2 GLUCOSE 397* 172* 131* 104*  BUN 16 23* 26* 29*  CALCIUM 7.7* 8.2* 8.1* 8.4*  CREATININE 0.53 0.59 0.52 0.53  GFRNONAA >60 >60 >60 >60  GFRAA >60 >60 >60 >60    LIVER FUNCTION TESTS:  Recent Labs  03/09/16 0530 03/11/16 0415 03/18/16 0423 03/26/16 0346  BILITOT 5.1* 4.8* 3.1* 3.0*  AST 71* 70* 37 37  ALT 41 43 33 28  ALKPHOS 107 115 137* 139*  PROT 5.6*  5.7* 5.8* 5.7*  ALBUMIN 1.2* 1.2* 1.4* 1.3*    Assessment and Plan:  Rt pleural effusion Chest tube drain intact Will follow  Electronically Signed: Tida Saner A 03/28/2016, 9:18 AM   I spent a total of 15 Minutes at the the patient's bedside AND on the patient's hospital floor or unit, greater than 50% of which was counseling/coordinating care for Rt chest tube drain

## 2016-03-28 NOTE — Progress Notes (Signed)
RT NOTE:  RN stated patient was tired and RR were increased, WOB increased. Upon arrival pt did agree she was very tired. RT put patient back on vent full support for overnight rest.

## 2016-03-28 NOTE — Progress Notes (Signed)
[facility] Trauma Service  Progress Note   LOS: 48 days   Subjective: C/o some abdominal pain, denies itching.  Pain meds helped.  She's very restless.  No N/V, on and off vent settings on trach.  Just got a bath.  No problems or drainage with wounds.  Objective: Vital signs in last 24 hours: Temp:  [98.6 F (37 C)-100 F (37.8 C)] 100 F (37.8 C) (05/21 0800) Pulse Rate:  [83-100] 88 (05/21 0830) Resp:  [0-23] 22 (05/21 0830) BP: (89-140)/(52-109) 118/67 mmHg (05/21 0830) SpO2:  [99 %-100 %] 100 % (05/21 0830) FiO2 (%):  [30 %-40 %] 40 % (05/21 0830) Weight:  [290 lb 5.5 oz (131.7 kg)] 290 lb 5.5 oz (131.7 kg) (05/21 0600) Last BM Date: 03/26/16  Lab Results:  CBC  Recent Labs  03/27/16 0600  WBC 8.0  HGB 6.6*  HCT 22.5*  PLT 153   BMET  Recent Labs  03/26/16 0346 03/27/16 0445  NA 140 139  K 3.6 3.7  CL 104 100*  CO2 29 30  GLUCOSE 131* 104*  BUN 26* 29*  CREATININE 0.52 0.53  CALCIUM 8.1* 8.4*    Imaging: Ct Image Guided Fluid Drain By Catheter  03/27/2016  INDICATION: History of suicide attempt by jumping from 5 story building with multiple injuries including recurrent right-sided hemothorax, post failed right-sided surgically placed chest tube placement. Request made for placement of a CT-guided right-sided chest tube. EXAM: CT IMAGE GUIDED FLUID DRAIN BY CATHETER COMPARISON:  Chest CT- 03/09/2016; chest radiograph - 03/26/2016; 03/24/2016; 03/23/2016 MEDICATIONS: The patient is currently admitted to the hospital and receiving intravenous antibiotics. The antibiotics were administered within an appropriate time frame prior to the initiation of the procedure. ANESTHESIA/SEDATION: Moderate (conscious) sedation was employed during this procedure. A total of Versed 3 mg and Fentanyl 100 mcg was administered intravenously. Moderate Sedation Time: 52 minutes. The patient's level of consciousness and vital signs were monitored continuously by radiology  nursing throughout the procedure under my direct supervision. CONTRAST:  None COMPLICATIONS: None immediate. PROCEDURE: Informed written consent was obtained from the patient sister after a discussion of the risks, benefits and alternatives to treatment. The patient was placed supine, slightly LPO on the CT gantry and a pre procedural CT was performed re-demonstrating the markedly complex small to moderate size right-sided effusion which is noted to contain innumerable tiny foci of air. The procedure was planned. A timeout was performed prior to the initiation of the procedure. The skin overlying the right lateral chest was prepped and draped in the usual sterile fashion. The overlying soft tissues were anesthetized with 1% lidocaine with epinephrine. Appropriate trajectory was planned with the use of a 22 gauge spinal needle. An 18 gauge trocar needle was advanced into the abscess/fluid collection and a short Amplatz super stiff wire was coiled within the collection. Appropriate positioning was confirmed with a limited CT scan. The tract was serially dilated allowing placement of a 12 [place] all-purpose drainage catheter. Under intermittent CT guidance, the right-sided chest tube was slowly withdrawn into the basilar aspect of the right pleural space. Approximately 20 cc of thick, dark red non foul smelling blood products were aspirated. The tube was connected to a Pleur-vac device and sutured in place. A dressing was placed. The patient tolerated the procedure well without immediate post procedural complication. IMPRESSION: Successful CT guided placement of a 58 French all purpose drain catheter into the basilar aspect of the right pleural space with aspiration of 20 mL  of thick, dark red, non foul smelling blood products. Samples were sent to the laboratory as requested by the ordering clinical team. Electronically Signed   By: Simonne ComeJohn  [name] M.D.   On: 03/27/2016 14:30     PE: General: pleasant, WD/WN AA  female who is laying in bed in NAD Heart: regular, rate, and rhythm.  Normal s1,s2. No obvious murmurs, gallops, or rubs noted.  Palpable radial and pedal pulses bilaterally Lungs: course breath sounds, vent sounds.  On/off trach collar. Abd: Obese, soft, ND, NT currently, +BS, no masses, hernias, or organomegaly, midline wound clean, nearly 100% beefy red, no drainage, elastimer sites clean Ext: swelling   Assessment/Plan: Jump from hotel/multiple toxic ingestions - on SSRI, sitter S/P ex lap, hepatorraphy, SBR, cholecystectomy, preperitoneal pelvic packing [name] 4/3  Ex lap for acidosis/bleeding 4/4 Thompson Ex lap for bleeding 4/5 [name] S/P ex fix pelvis [name] 4/3 S/P ex lap 4/7 Thompson S/P ex lap 4/10 Thompson S/P sacral screws 4/10 [name] S/P application ABRA 4/12 [name]  S/P abd closure, trach 4/24 [name] Resp failure - On and off of vent/TC, mucolytic Mult B rib FX/R HPTX, B pulm contusions - Rt CT out, IR placed Chest tube drain (03/27/16) for pleural effusion Pneumonia - Enterobacter cloacae - cefepime sensitive Day #4/10??? Multiple toxic ingestions Mult L wrist FXs - splint for now Pelvic FX - S/P ex fix and angioembolization, S/P sacral screw by Dr. Carola FrostHandy. Ex fix removed at bedside.  F/U X-rays done. Bed to chair transfers only for another 4-6 weeks, Pivot with to assist with transfers. ABL anemia - Hgb 6.6 yesterday, recheck pending today Hyperglycemia - SSI FEN - continue tube feeds, lasix VTE - SCD's, Lovenox on hold, surveillance dopplers neg 5/11 Dispo - ICU, psychiatry, LTAC?   Jorje GuildMegan Yamato Kopf, PA-C Pager: [phone] General Trauma PA Pager: [phone]  (7am - 4:30pm M-F; 7am - 11:30am Sa/Su)  03/28/2016

## 2016-03-29 DIAGNOSIS — J90 Pleural effusion, not elsewhere classified: Secondary | ICD-10-CM

## 2016-03-29 LAB — GLUCOSE, CAPILLARY
GLUCOSE-CAPILLARY: 101 mg/dL — AB (ref 65–99)
GLUCOSE-CAPILLARY: 103 mg/dL — AB (ref 65–99)
GLUCOSE-CAPILLARY: 103 mg/dL — AB (ref 65–99)
GLUCOSE-CAPILLARY: 135 mg/dL — AB (ref 65–99)
Glucose-Capillary: 107 mg/dL — ABNORMAL HIGH (ref 65–99)
Glucose-Capillary: 114 mg/dL — ABNORMAL HIGH (ref 65–99)
Glucose-Capillary: 130 mg/dL — ABNORMAL HIGH (ref 65–99)

## 2016-03-29 LAB — CBC
HEMATOCRIT: 25.6 % — AB (ref 36.0–46.0)
HEMOGLOBIN: 7.7 g/dL — AB (ref 12.0–15.0)
MCH: 28.9 pg (ref 26.0–34.0)
MCHC: 30.1 g/dL (ref 30.0–36.0)
MCV: 96.2 fL (ref 78.0–100.0)
Platelets: 226 10*3/uL (ref 150–400)
RBC: 2.66 MIL/uL — AB (ref 3.87–5.11)
RDW: 17 % — ABNORMAL HIGH (ref 11.5–15.5)
WBC: 7 10*3/uL (ref 4.0–10.5)

## 2016-03-29 MED ORDER — SERTRALINE HCL 50 MG PO TABS
50.0000 mg | ORAL_TABLET | Freq: Every day | ORAL | Status: DC
  Administered 2016-03-29 – 2016-04-01 (×4): 50 mg via ORAL
  Filled 2016-03-29 (×4): qty 1

## 2016-03-29 NOTE — Progress Notes (Signed)
Orthopedic Tech Progress Note Patient Details:  [name] Bell [birth year] [id number]  Ortho Devices Type of Ortho Device: Velcro wrist forearm splint Ortho Device/Splint Location: LUE  Ortho Device/Splint Interventions: Ordered, Application   Jennye MoccasinHughes, [name] 03/29/2016, 3:06 PM

## 2016-03-29 NOTE — Progress Notes (Signed)
Physical Therapy Treatment Patient Details Name: [name] Bell MRN: [id number] DOB: 30-Dec-1980 Today's Date: 03/29/2016    History of Present Illness [name] Bell is a 35 y.o. female.She is obese. PMH unknown.Pt attempted suicide: taped DNR note to chest, drank Drano, then jumped off 5 [address] building [place]S/p 02/09/16 gel foam embolization of bil internal iliac arteries.S/p 02/09/16 ex lap with cholecystectomy, resection SB and mesenteric repair, liver lac repair, application or wound vac.S/p 02/09/16 closed reduction of pelvic ring, external pelvic fixation.Also has right metacarpal fracture in splint. Pt on and off intubation via trach/ and on/off trac collar.  Pelvic x- fixator pins removed at bedside on 03/23/16.     PT Comments    Pt is the most alert I have seen her in the past few sessions.  She expresses fear and anxiety over financial cost of hospitalization, fear of what is going to happen to her as she gets better, and general loss of control of her situation.  Asked RN to call the chaplin so that she could talk with someone (as psych MD already came today).  Pt was initially reluctant, but ultimately agreeable to be lifted OOB to chair.  PT will continue to follow acutely.   Follow Up Recommendations  CIR     Equipment Recommendations  Wheelchair (measurements PT);Wheelchair cushion (measurements PT);Hospital bed;Other (comment) (hoyer lift, bari equipment needed)    Recommendations for Other Services Rehab consult     Precautions / Restrictions Precautions Precautions: Fall Precaution Comments: multiple abdominal wounds, trach, feeding tube, chest tube Restrictions LUE Weight Bearing: Weight bearing as tolerated (in splint) RLE Weight Bearing: Weight bearing as tolerated LLE Weight Bearing: Weight bearing as tolerated    Mobility  Bed Mobility Overal bed mobility: Needs Assistance Bed Mobility: Rolling;Supine to Sit;Sit to Supine Rolling: +2 for  physical assistance;Total assist   Supine to sit: +2 for physical assistance;Total assist Sit to supine: +2 for physical assistance;Total assist (+3 for back to bed)   General bed mobility comments: Pt attempting to reach at times with hands, but mostly not helping a whole lot, grimacing with transitions  Transfers Overall transfer level: Needs assistance               General transfer comment: Maxi sky used to lift pt OOB to chair.  Pt started having a BMas we lifted her up, so we took the opportunity to assist with peri care while we had easy access to her bottom.  Pt became very anxious and aggitated during this process and finally communicated that she had to get out of the lift sling (she indicated that it was the sling not the action of gettting out of bed causing her anxiety.  I asked if she was clautrophobic and she shook her head "no", so I am not sure why this triggered a reaction today vs the two other times we got up with the sling.          Balance Overall balance assessment: Needs assistance Sitting-balance support: Feet supported;Bilateral upper extremity supported Sitting balance-[name]: Zero Sitting balance - Comments: Max assist EOB, however, pt able to support herself with both hands EOB and was able, when assisted and unweighted to preform ankle and seated knee exercises.   Postural control: Posterior lean                          Cognition Arousal/Alertness: Awake/alert Behavior During Therapy: Anxious;Agitated (in lift sling, became  very agitated) Overall Cognitive Status: Difficult to assess         Following Commands: Follows one step commands consistently   Awareness: Emergent   General Comments: Today is the most alert I have seen her and the most communicative via writing on a communication board.  She seems to be caught somewhere inbetween feeling like she has no control here in the hospital and wanting to get better today.  She was  emotional and wanting to speak with psych or chaplin at the end of my session.  She voiced concern about the cost of being here and about her rights as a patient.  I educated her that there are people working on the financial aspect of her stay and that she did have the right to refuse as long as it was not to further hurt herself.  She expressed fear of the next steps as she gets better (scared of moving units, scared of what will happen as she gets better).  She also spoke a bit about hallucinations, but I could not get much out of that conversation with her.      Exercises General Exercises - Lower Extremity Long Arc Quad: Both;5 reps;AAROM Toe Raises: AROM;Both;10 reps Heel Raises: AROM;Both;10 reps    General Comments General comments (skin integrity, edema, etc.): Pt on TC entire session and VSS throughout.       Pertinent Vitals/Pain Pain Assessment: Faces Faces Pain Scale: Hurts whole lot Pain Location: pelvis/abdomen Pain Descriptors / Indicators: Grimacing;Guarding Pain Intervention(s): Limited activity within patient's tolerance;Monitored during session;Repositioned           PT Goals (current goals can now be found in the care plan section) Acute Rehab PT Goals Patient Stated Goal: did not state today,  Progress towards PT goals: Progressing toward goals    Frequency  Min 5X/week    PT Plan Current plan remains appropriate       End of Session Equipment Utilized During Treatment: Oxygen (trach collar) Activity Tolerance: Patient limited by pain Patient left: in chair;with nursing/sitter in room     Time: 4540-9811 PT Time Calculation (min) (ACUTE ONLY): 60 min  Charges:  $Therapeutic Activity: 53-67 mins                      Zymir Napoli B. Jameson Tormey, PT, DPT [phone]   03/29/2016, 3:38 PM

## 2016-03-29 NOTE — Progress Notes (Signed)
   03/29/16 1622  Clinical Encounter Type  Visited With Patient;Health care provider  Visit Type Follow-up;Critical Care  Referral From Nurse   Chaplain responded to a request from the patient's RN to visit. Chaplain met with her, and she is requesting to speak with the psychiatrist. Chaplain support available as needed.   Jeri Lager, Chaplain 03/29/2016 4:24 PM

## 2016-03-29 NOTE — Progress Notes (Signed)
28 Days Post-Op Procedure(s) (LRB): ABDOMINAL WOUND CLOSURE (N/A) PERCUTANEOUS TRACHEOSTOMY (N/A) Subjective: Almost 500 cc of old blood drained from R pleural space in 2.5 days- place by IR CXR appears better patient brearthing comfortably on trach collar  Objective: Vital signs in last 24 hours: Temp:  [98 F (36.7 C)-99.4 F (37.4 C)] 99.4 F (37.4 C) (05/22 1200) Pulse Rate:  [73-105] 86 (05/22 1155) Cardiac Rhythm:  [-] Normal sinus rhythm (05/22 0800) Resp:  [6-37] 18 (05/22 1155) BP: (97-129)/(59-81) 115/59 mmHg (05/22 1155) SpO2:  [93 %-100 %] 99 % (05/22 1155) FiO2 (%):  [30 %-40 %] 30 % (05/22 1155) Weight:  [284 lb 2.8 oz (128.9 kg)] 284 lb 2.8 oz (128.9 kg) (05/22 0500)  Hemodynamic parameters for last 24 hours:   stable Intake/Output from previous day: 05/21 0701 - 05/22 0700 In: 2720 [I.V.:1150; NG/GT:1520; IV Piggyback:50] Out: [id number] [Urine:3445; Chest Tube:200] Intake/Output this shift: Total I/O In: 500 [I.V.:250; NG/GT:200; IV Piggyback:50] Out: 425 [Urine:425]  No air leak fron pleural tube  Lab Results:  Recent Labs  03/27/16 0600 03/29/16 0445  WBC 8.0 7.0  HGB 6.6* 7.7*  HCT 22.5* 25.6*  PLT 153 226   BMET:  Recent Labs  03/27/16 0445  NA 139  K 3.7  CL 100*  CO2 30  GLUCOSE 104*  BUN 29*  CREATININE 0.53  CALCIUM 8.4*    PT/INR:  Recent Labs  03/27/16 0445  LABPROT 18.3*  INR 1.52*   ABG    Component Value Date/Time   PHART 7.508* 02/28/2016 0642   HCO3 25.5* 02/28/2016 0642   TCO2 26.5 02/28/2016 0642   ACIDBASEDEF 2.0 02/13/2016 0411   O2SAT 99.2 02/28/2016 0642   CBG (last 3)   Recent Labs  03/29/16 0324 03/29/16 0804 03/29/16 1144  GLUCAP 135* 101* 130*    Assessment/Plan: S/P Procedure(s) (LRB): ABDOMINAL WOUND CLOSURE (N/A) PERCUTANEOUS TRACHEOSTOMY (N/A) Cont R pleural drain   LOS: 49 days    [name] Bell 03/29/2016

## 2016-03-29 NOTE — Clinical Social Work Note (Signed)
Clinical Social Worker continuing to follow patient and family for support. Patient with a trach, and was able to successfully wean to trach collar this morning. Patient sister not currently at bedside. Patient has been seen by psych and they are recommending continued follow up. Psych CSW notified of patient progress. Unit CSW remains available for support at this time.  Macario GoldsJesse Zacary Bauer, Kentucky[place]CS[place] [phone]

## 2016-03-29 NOTE — Progress Notes (Signed)
Speech Language Pathology Treatment:    Patient Details Name: Phylliss BobKatia D Avakian MRN: [id number] DOB: [birth year] Today's Date: 03/29/2016 Time:  -     ST will see Meade MawKatia with [name] speaking valve next date (unable to see today).    Breck CoonsLisa Willis GoodmanLitaker M.Ed [employer]P Pager [phone]

## 2016-03-29 NOTE — Progress Notes (Signed)
Inpatient Rehabilitation  Pt. Currently with limited tolerance for therapies (limited to bed activities).  As pt. Becomes  able to tolerate more activity would want to consider IP Rehab consult.  Please call if questions.  Weldon PickingSusan Loleta Frommelt PT Inpatient Rehab Admissions Coordinator Cell [phone] Office [phone]

## 2016-03-29 NOTE — Progress Notes (Signed)
Referring Physician(s): Dr. Kathlee NationsPeter Van Trigt  Supervising Physician: Malachy MoanMcCullough, Heath  Patient Status: In-pt  Chief Complaint:  F/U after right chest tube placment by Dr. Grace IsaacWatts 03/27/2016  Subjective:  [name] remains on the vent via tracheostomy.  She has a family member at her bedside.  Allergies: Review of patient's allergies indicates no known allergies.  Medications: Prior to Admission medications   Not on File     Vital Signs: BP 129/78 mmHg  Pulse 88  Temp(Src) 98.6 F (37 C) (Axillary)  Resp 17  Ht 5\' 11"  (1.803 m)  Wt 284 lb 2.8 oz (128.9 kg)  BMI 39.65 kg/m2  SpO2 100%  LMP  (LMP Unknown)  Physical Exam  Awake Trach/Vent Chest tube in place, site ok. To gravity. Output ~200 ml bloody drainage.  Imaging: Dg Chest Port 1 View  03/28/2016  CLINICAL DATA:  Respiratory failure. EXAM: PORTABLE CHEST 1 VIEW COMPARISON:  Mar 26, 2016 FINDINGS: The tracheostomy tube is in stable position. The right PICC line terminates near the caval atrial junction. Persistent bilateral pulmonary opacity, right greater the left. A right-sided effusion is seen. A pigtail catheter now projects over the right lung base. No change in the cardiomediastinal silhouette. IMPRESSION: 1. Stable support apparatus. 2. No interval change in the bilateral pulmonary opacities and right effusion. New right pigtail catheter projected over the right base. Electronically Signed   By: Gerome Samavid  Williams III M.D   On: 03/28/2016 10:58   Dg Chest Port 1 View  03/26/2016  CLINICAL DATA:  Status post fall with significant chest trauma with multiple rib fractures and pulmonary contusions EXAM: PORTABLE CHEST 1 VIEW COMPARISON:  Portable chest x-ray of Mar 24, 2016 FINDINGS: The lungs are hypoinflated. The pulmonary interstitium remains largely opacified on the right and is more opacified than previously demonstrated on the left. Pleural fluid surrounds the convexity of the right lung. No pneumothorax is  evident. The tracheostomy appliance tip projects at the level of the clavicular heads. The right subclavian venous catheter tip projects over the midportion of the SVC. Multiple right-sided rib fractures are demonstrated. IMPRESSION: Increasing opacity in the previously reasonably aerated left lung consistent with worsening interstitial edema or pneumonia. On the right there is persistent airspace opacification diffusely. Large amount of pleural fluid surrounding the convexity of the right lung is stable. Electronically Signed   By: David  SwazilandJordan M.D.   On: 03/26/2016 07:38   Ct Image Guided Fluid Drain By Catheter  03/27/2016  INDICATION: History of suicide attempt by jumping from 5 story building with multiple injuries including recurrent right-sided hemothorax, post failed right-sided surgically placed chest tube placement. Request made for placement of a CT-guided right-sided chest tube. EXAM: CT IMAGE GUIDED FLUID DRAIN BY CATHETER COMPARISON:  Chest CT- 03/09/2016; chest radiograph - 03/26/2016; 03/24/2016; 03/23/2016 MEDICATIONS: The patient is currently admitted to the hospital and receiving intravenous antibiotics. The antibiotics were administered within an appropriate time frame prior to the initiation of the procedure. ANESTHESIA/SEDATION: Moderate (conscious) sedation was employed during this procedure. A total of Versed 3 mg and Fentanyl 100 mcg was administered intravenously. Moderate Sedation Time: 52 minutes. The patient's level of consciousness and vital signs were monitored continuously by radiology nursing throughout the procedure under my direct supervision. CONTRAST:  None COMPLICATIONS: None immediate. PROCEDURE: Informed written consent was obtained from the patient sister after a discussion of the risks, benefits and alternatives to treatment. The patient was placed supine, slightly LPO on the CT gantry and a  pre procedural CT was performed re-demonstrating the markedly complex small to  moderate size right-sided effusion which is noted to contain innumerable tiny foci of air. The procedure was planned. A timeout was performed prior to the initiation of the procedure. The skin overlying the right lateral chest was prepped and draped in the usual sterile fashion. The overlying soft tissues were anesthetized with 1% lidocaine with epinephrine. Appropriate trajectory was planned with the use of a 22 gauge spinal needle. An 18 gauge trocar needle was advanced into the abscess/fluid collection and a short Amplatz super stiff wire was coiled within the collection. Appropriate positioning was confirmed with a limited CT scan. The tract was serially dilated allowing placement of a 12 [place] all-purpose drainage catheter. Under intermittent CT guidance, the right-sided chest tube was slowly withdrawn into the basilar aspect of the right pleural space. Approximately 20 cc of thick, dark red non foul smelling blood products were aspirated. The tube was connected to a Pleur-vac device and sutured in place. A dressing was placed. The patient tolerated the procedure well without immediate post procedural complication. IMPRESSION: Successful CT guided placement of a 46 French all purpose drain catheter into the basilar aspect of the right pleural space with aspiration of 20 mL of thick, dark red, non foul smelling blood products. Samples were sent to the laboratory as requested by the ordering clinical team. Electronically Signed   By: Simonne Come M.D.   On: 03/27/2016 14:30    Labs:  CBC:  Recent Labs  03/24/16 0431 03/25/16 0415 03/27/16 0600 03/29/16 0445  WBC 16.0* 16.6* 8.0 7.0  HGB 7.8* 7.1* 6.6* 7.7*  HCT 26.8* 23.5* 22.5* 25.6*  PLT 135* 116* 153 226    COAGS:  Recent Labs  02/09/16 1040 02/09/16 1430  02/13/16 0600 02/13/16 1620 02/23/16 1926 02/24/16 0415 03/27/16 0445  INR 2.11* 1.99*  < > 2.04* 1.75*  --  1.47 1.52*  APTT 80* 63*  --  36  --  37  --   --   < > = values  in this interval not displayed.  BMP:  Recent Labs  03/24/16 0431 03/25/16 0415 03/26/16 0346 03/27/16 0445  NA 129* 136 140 139  K 5.4* 3.9 3.6 3.7  CL 95* 99* 104 100*  CO2 GLUCOSE 397* 172* 131* 104*  BUN 16 23* 26* 29*  CALCIUM 7.7* 8.2* 8.1* 8.4*  CREATININE 0.53 0.59 0.52 0.53  GFRNONAA >60 >60 >60 >60  GFRAA >60 >60 >60 >60    LIVER FUNCTION TESTS:  Recent Labs  03/09/16 0530 03/11/16 0415 03/18/16 0423 03/26/16 0346  BILITOT 5.1* 4.8* 3.1* 3.0*  AST 71* 70* 37 37  ALT 41 43 33 28  ALKPHOS 107 115 137* 139*  PROT 5.6* 5.7* 5.8* 5.7*  ALBUMIN 1.2* 1.2* 1.4* 1.3*    Assessment and Plan:  S/P placement of right chest tube by Dr. Grace Isaac on 03/27/2016 for recurrent pleural effusion.  Continue current care. Will continue to follow.  Electronically Signed: Gwynneth Macleod PA-C 03/29/2016, 10:18 AM   I spent a total of 15 Minutes at the the patient's bedside AND on the patient's hospital floor or unit, greater than 50% of which was counseling/coordinating care for chest tube

## 2016-03-29 NOTE — Progress Notes (Signed)
Trauma Service Note  Subjective: Patient looks great on the trach collar.  Wants to go home.  This is the best that she has looked   Objective: Vital signs in last 24 hours: Temp:  [98 F (36.7 C)-98.7 F (37.1 C)] 98.6 F (37 C) (05/22 0800) Pulse Rate:  [73-110] 88 (05/22 0900) Resp:  [6-37] 17 (05/22 0900) BP: (97-132)/(57-83) 129/78 mmHg (05/22 0900) SpO2:  [91 %-100 %] 100 % (05/22 0900) FiO2 (%):  [30 %-40 %] 30 % (05/22 0900) Weight:  [128.9 kg (284 lb 2.8 oz)] 128.9 kg (284 lb 2.8 oz) (05/22 0500) Last BM Date: 03/28/16  Intake/Output from previous day: 05/21 0701 - 05/22 0700 In: 2720 [I.V.:1150; NG/GT:1520; IV Piggyback:50] Out: [id number] [Urine:3445; Chest Tube:200] Intake/Output this shift: Total I/O In: 230 [I.V.:150; NG/GT:80] Out: 200 [Urine:200]  General: No acute distress  Lungs: Coarse breath sounds bilaterally.  Can cough up her own secretions.  On trach collar and sats are okay.  Abd: Soft and tolerating tube feedings.  Extremities: No changes  Neuro: Intact.  WBC is down.Hemoglobin is okay at 7.7 and platelets are up.  Lab Results: CBC   Recent Labs  03/27/16 0600 03/29/16 0445  WBC 8.0 7.0  HGB 6.6* 7.7*  HCT 22.5* 25.6*  PLT 153 226   BMET  Recent Labs  03/27/16 0445  NA 139  K 3.7  CL 100*  CO2 30  GLUCOSE 104*  BUN 29*  CREATININE 0.53  CALCIUM 8.4*   PT/INR  Recent Labs  03/27/16 0445  LABPROT 18.3*  INR 1.52*   ABG No results for input(s): PHART, HCO3 in the last 72 hours.  Invalid input(s): PCO2, PO2  Studies/Results: Dg Chest Port 1 View  03/28/2016  CLINICAL DATA:  Respiratory failure. EXAM: PORTABLE CHEST 1 VIEW COMPARISON:  Mar 26, 2016 FINDINGS: The tracheostomy tube is in stable position. The right PICC line terminates near the caval atrial junction. Persistent bilateral pulmonary opacity, right greater the left. A right-sided effusion is seen. A pigtail catheter now projects over the right lung base. No  change in the cardiomediastinal silhouette. IMPRESSION: 1. Stable support apparatus. 2. No interval change in the bilateral pulmonary opacities and right effusion. New right pigtail catheter projected over the right base. Electronically Signed   By: Gerome Samavid  Williams III M.D   On: 03/28/2016 10:58   Ct Image Guided Fluid Drain By Catheter  03/27/2016  INDICATION: History of suicide attempt by jumping from 5 story building with multiple injuries including recurrent right-sided hemothorax, post failed right-sided surgically placed chest tube placement. Request made for placement of a CT-guided right-sided chest tube. EXAM: CT IMAGE GUIDED FLUID DRAIN BY CATHETER COMPARISON:  Chest CT- 03/09/2016; chest radiograph - 03/26/2016; 03/24/2016; 03/23/2016 MEDICATIONS: The patient is currently admitted to the hospital and receiving intravenous antibiotics. The antibiotics were administered within an appropriate time frame prior to the initiation of the procedure. ANESTHESIA/SEDATION: Moderate (conscious) sedation was employed during this procedure. A total of Versed 3 mg and Fentanyl 100 mcg was administered intravenously. Moderate Sedation Time: 52 minutes. The patient's level of consciousness and vital signs were monitored continuously by radiology nursing throughout the procedure under my direct supervision. CONTRAST:  None COMPLICATIONS: None immediate. PROCEDURE: Informed written consent was obtained from the patient sister after a discussion of the risks, benefits and alternatives to treatment. The patient was placed supine, slightly LPO on the CT gantry and a pre procedural CT was performed re-demonstrating the markedly complex small to moderate size  right-sided effusion which is noted to contain innumerable tiny foci of air. The procedure was planned. A timeout was performed prior to the initiation of the procedure. The skin overlying the right lateral chest was prepped and draped in the usual sterile fashion. The  overlying soft tissues were anesthetized with 1% lidocaine with epinephrine. Appropriate trajectory was planned with the use of a 22 gauge spinal needle. An 18 gauge trocar needle was advanced into the abscess/fluid collection and a short Amplatz super stiff wire was coiled within the collection. Appropriate positioning was confirmed with a limited CT scan. The tract was serially dilated allowing placement of a 12 [place] all-purpose drainage catheter. Under intermittent CT guidance, the right-sided chest tube was slowly withdrawn into the basilar aspect of the right pleural space. Approximately 20 cc of thick, dark red non foul smelling blood products were aspirated. The tube was connected to a Pleur-vac device and sutured in place. A dressing was placed. The patient tolerated the procedure well without immediate post procedural complication. IMPRESSION: Successful CT guided placement of a 106 French all purpose drain catheter into the basilar aspect of the right pleural space with aspiration of 20 mL of thick, dark red, non foul smelling blood products. Samples were sent to the laboratory as requested by the ordering clinical team. Electronically Signed   By: Simonne Come M.D.   On: 03/27/2016 14:30    Anti-infectives: Anti-infectives    Start     Dose/Rate Route Frequency Ordered Stop   03/26/16 1030  cefTRIAXone (ROCEPHIN) 2 g in dextrose 5 % 50 mL IVPB     2 g 100 mL/hr over 30 Minutes Intravenous Every 24 hours 03/26/16 1023     03/25/16 1000  ceFEPIme (MAXIPIME) 2 g in dextrose 5 % 50 mL IVPB  Status:  Discontinued     2 g 100 mL/hr over 30 Minutes Intravenous Every 12 hours 03/25/16 0829 03/26/16 1023   03/08/16 1400  levofloxacin (LEVAQUIN) IVPB 750 mg     750 mg 100 mL/hr over 90 Minutes Intravenous Every 24 hours 03/08/16 0912 03/10/16 1501   03/06/16 1400  ceFEPIme (MAXIPIME) 2 g in dextrose 5 % 50 mL IVPB  Status:  Discontinued     2 g 100 mL/hr over 30 Minutes Intravenous Every 12 hours  03/06/16 1344 03/08/16 0912   03/01/16 1330  ceFAZolin (ANCEF) IVPB 2g/100 mL premix     2 g 200 mL/hr over 30 Minutes Intravenous To ShortStay [facility] 02/29/16 0917 03/01/16 1452   02/11/16 0600  piperacillin-tazobactam (ZOSYN) IVPB 3.375 g  Status:  Discontinued     3.375 g 100 mL/hr over 30 Minutes Intravenous 4 times per day 02/10/16 2214 02/22/16 0853   02/10/16 1730  piperacillin-tazobactam (ZOSYN) IVPB 3.375 g  Status:  Discontinued     3.375 g 12.5 mL/hr over 240 Minutes Intravenous 3 times per day 02/10/16 1727 02/10/16 2214      Assessment/Plan: s/p Procedure(s): ABDOMINAL WOUND CLOSURE PERCUTANEOUS TRACHEOSTOMY Get therapy teams involved again and rehab consultation.  LOS: 49 days   Marta Lamas. Gae Bon, MD, FACS [phone] Trauma Surgeon 03/29/2016
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
## 2016-03-30 LAB — CULTURE, ROUTINE-ABSCESS: SPECIAL REQUESTS: NORMAL

## 2016-03-30 LAB — BASIC METABOLIC PANEL
ANION GAP: 4 — AB (ref 5–15)
BUN: 16 mg/dL (ref 6–20)
CHLORIDE: 101 mmol/L (ref 101–111)
CO2: 35 mmol/L — AB (ref 22–32)
Calcium: 8.2 mg/dL — ABNORMAL LOW (ref 8.9–10.3)
Creatinine, Ser: 0.31 mg/dL — ABNORMAL LOW (ref 0.44–1.00)
GFR calc non Af Amer: >60 mL/min (ref >60)
Glucose, Bld: 126 mg/dL — ABNORMAL HIGH (ref 65–99)
POTASSIUM: 3.3 mmol/L — AB (ref 3.5–5.1)
SODIUM: 140 mmol/L (ref 135–145)

## 2016-03-30 LAB — GLUCOSE, CAPILLARY
GLUCOSE-CAPILLARY: 125 mg/dL — AB (ref 65–99)
GLUCOSE-CAPILLARY: 97 mg/dL (ref 65–99)
GLUCOSE-CAPILLARY: 107 mg/dL — AB (ref 65–99)
GLUCOSE-CAPILLARY: 117 mg/dL — AB (ref 65–99)
Glucose-Capillary: 117 mg/dL — ABNORMAL HIGH (ref 65–99)
Glucose-Capillary: 128 mg/dL — ABNORMAL HIGH (ref 65–99)

## 2016-03-30 LAB — CBC
HEMATOCRIT: 28.5 % — AB (ref 36.0–46.0)
HEMOGLOBIN: 8.5 g/dL — AB (ref 12.0–15.0)
MCH: 29.1 pg (ref 26.0–34.0)
MCHC: 29.8 g/dL — ABNORMAL LOW (ref 30.0–36.0)
MCV: 97.6 fL (ref 78.0–100.0)
Platelets: 261 10*3/uL (ref 150–400)
RBC: 2.92 MIL/uL — AB (ref 3.87–5.11)
RDW: 16.8 % — ABNORMAL HIGH (ref 11.5–15.5)
WBC: 8.2 10*3/uL (ref 4.0–10.5)

## 2016-03-30 LAB — MRSA PCR SCREENING: MRSA BY PCR: NEGATIVE

## 2016-03-30 MED ORDER — POTASSIUM CHLORIDE 20 MEQ/15ML (10%) PO SOLN
40.0000 meq | Freq: Two times a day (BID) | ORAL | Status: DC
  Administered 2016-03-30 – 2016-04-10 (×21): 40 meq via ORAL
  Filled 2016-03-30 (×25): qty 30

## 2016-03-30 MED ORDER — FUROSEMIDE 10 MG/ML IJ SOLN
40.0000 mg | Freq: Once | INTRAMUSCULAR | Status: AC
  Administered 2016-03-30: 40 mg via INTRAVENOUS
  Filled 2016-03-30: qty 4

## 2016-03-30 MED ORDER — PIVOT 1.5 CAL PO LIQD
1000.0000 mL | ORAL | Status: DC
  Administered 2016-03-30 – 2016-04-08 (×7): 1000 mL
  Filled 2016-03-30 (×17): qty 1000

## 2016-03-30 MED ORDER — CHLORHEXIDINE GLUCONATE 0.12 % MT SOLN
OROMUCOSAL | Status: AC
  Administered 2016-03-30: 15 mL via OROMUCOSAL
  Filled 2016-03-30: qty 15

## 2016-03-30 NOTE — Progress Notes (Signed)
Patient ID: [name] BobKatia D Bell, female   DOB: May 22, 1981, 35 y.o.   MRN: [id number] G tube had pulled back a bit - repositioned easily. She refuses to get up in lift with therapies despite my discussing this with her. [name] Kamiah Fite, MD, MPH, FACS Trauma: [phone] General Surgery: [phone]

## 2016-03-30 NOTE — Progress Notes (Addendum)
Speech Language Pathology Treatment: [name] Bell Speaking valve  Patient Details Name: [name] Bell MRN: [id number] DOB: Mar 27, 1981 Today's Date: 03/30/2016 Time: [id number] SLP Time Calculation (min) (ACUTE ONLY): 22 min  Assessment / Plan / Recommendation Clinical Impression  Pt alert, responsive, oriented. RN reports medications have been adjusted and pt with increased responsiveness. Pt required additional time to recover (coughing and abdominal pain) following deflation and deep suctioning with RT. Pt attempting to verbalize at phrase level with mod-severe decreased respiratory support. Therapeutic abdominal breathing exercises to direct greater airflow to upper airway (with caution due to abdominal wound partially open). Vocalization achieved approximately 3 times, remainder more whispered/breathy output. Back pressure evident with revealing suboptimal air mobilization. All vitals within functional limits. Very pleased with [name]'s progress. Recommending cuff remain deflated and pt may wear valve with staff with full supervision.        HPI HPI: [name] Bell is a 35 y.o. female.PMH unknown.Pt attempted suicide: taped DNR note to chest, drank Drano, then jumped off 5 story building ([address]S/p 02/09/16 gel foam embolization of bil internal iliac arteries.S/p 02/09/16 ex lap with cholecystectomy, resection SB and mesenteric repair, liver lac repair, application or wound vac.S/p 02/09/16 closed reduction of pelvic ring, external pelvic fixation.Also has right metacarpal fracture in splint. Has #6 cuffed trach and has tolerated trach collar for 24 hours.      SLP Plan  Continue with current plan of care     Recommendations         Patient may use Passy-Bell Speech Valve: Intermittently with supervision PMSV Supervision: Full      Oral Care Recommendations: Oral care QID Follow up Recommendations: LTACH Plan: Continue with current plan of care                      Royce MacadamiaLitaker, Yareth Macdonnell Willis 03/30/2016, 9:11 AM   Breck CoonsLisa Willis Lonell FaceLitaker M.Ed [employer]SLP Pager [phone]

## 2016-03-30 NOTE — Progress Notes (Signed)
Occupational Therapy Treatment Patient Details Name: [name] Bell MRN: [id number] DOB: December 11, 1980 Today's Date: 03/30/2016    History of present illness [name] Bell is a 35 y.o. female.She is obese. PMH unknown.Pt attempted suicide: taped DNR note to chest, drank Drano, then jumped off 5 story building ([address]).S/p 02/09/16 gel foam embolization of bil internal iliac arteries.S/p 02/09/16 ex lap with cholecystectomy, resection SB and mesenteric repair, liver lac repair, application or wound vac.S/p 02/09/16 closed reduction of pelvic ring, external pelvic fixation.Also has right metacarpal fracture in splint. Pt on and off intubation via trach/ and on/off trac collar.  Pelvic x- fixator pins removed at bedside on 03/23/16.    OT comments  Pt demonstrates ability to sit EOB for > 10 minutes. Pt with anxiety about hoyer lift and needed much coaching to complete OOB transfer. Pt reports hallucinations at this time. MD made aware. Recommend psych consult to help address patients hallucinations and visions.    Follow Up Recommendations  LTACH;Supervision/Assistance - 24 hour    Equipment Recommendations  Other (comment) (defer to next venue)    Recommendations for Other Services      Precautions / Restrictions Precautions Precautions: Fall Precaution Comments: multiple abdominal wounds, trach, feeding tube, chest tube Required Braces or Orthoses: Other Brace/Splint Other Brace/Splint: L wrist in splint, abdominal binder Restrictions LUE Weight Bearing: Weight bearing as tolerated RLE Weight Bearing: Weight bearing as tolerated LLE Weight Bearing: Weight bearing as tolerated       Mobility Bed Mobility Overal bed mobility: Needs Assistance Bed Mobility: Supine to Sit Rolling: +2 for physical assistance;Mod assist   Supine to sit: +2 for physical assistance;Max assist     General bed mobility comments: pt initiated bil LE toward EOB and reaching with L UE. pt pushing  up with R UE. pt needed (A) to advance core to upright posture  Transfers Overall transfer level: Needs assistance               General transfer comment: sitting EOB with maxisky placed. pt lateral leaning to place maxisky pad.     Balance Overall balance assessment: Needs assistance   Sitting balance-[name]: Poor Sitting balance - Comments: posterior lean but able to sustain min guard with bil UE on knee and verbal cues to place nose over toes                           ADL                                         General ADL Comments: pt demonstrates use of BIL UE and engagement of trunk during session. pt needed distractors to help patient engage core without awareness. pt at times able to sit EOB min to min guard for brief time. Pt motivated this session by need to void. Pt agreeable to hoyer after introduction of ability to sit upright to void bowels. pt participated fully at that time. Pt educated that eob only task would be performed if hoyer is refused during therapy sessions. pt is unsafe to transfer and risk staff injury to anterior/ posterior transfer at this time. Pt educated and reports understanding.       Vision                     Perception  Praxis      Cognition   Behavior During Therapy: Anxious;Agitated Overall Cognitive Status: Impaired/Different from baseline                  General Comments: pt using PMV reports seeing halluications of people trying to kill her. MD notified. Pt reports currently not seeing them in the room during our session. Pt reports not wanting to use the lift but does not give a very defined reason initially stating she prefer to progress slowly. 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 its undignified"    Extremity/Trunk Assessment               Exercises     Shoulder Instructions       General Comments      Pertinent Vitals/  Pain       Pain Assessment: Faces Pain Location: grimace at times without specific   Home Living                                          Prior Functioning/Environment              Frequency Min 3X/week     Progress Toward Goals  OT Goals(current goals can now be found in the care plan section)  Progress towards OT goals: Progressing toward goals  Acute Rehab OT Goals Patient Stated Goal: to see sister OT Goal Formulation: Patient unable to participate in goal setting Time For Goal Achievement: 04/06/16 Potential to Achieve Goals: Fair ADL Goals Additional ADL Goal #1: pt will follow 1 step command 4 out 5 attempts Additional ADL Goal #2: Pt will tolerate EOB for 56minutes as precursor to adls (MET) Additional ADL Goal #3: Pt will demonstrates focused attention to adl task  Plan Discharge plan remains appropriate    Co-evaluation                 End of Session Equipment Utilized During Treatment: Oxygen   Activity Tolerance Patient tolerated treatment well   Patient Left in chair;with call bell/phone within reach;with chair alarm set   Nurse Communication Mobility status;Precautions;Need for lift equipment        Time: [id number] Time Calculation (min): 52 min  Charges: OT General Charges $OT Visit: 1 Procedure OT Treatments $Therapeutic Activity: 8-22 mins  JParke PoissonB 03/30/2016, 2:35 PM   JJeri Modena  OTR/L Pager: [phone]: [phone].

## 2016-03-30 NOTE — Progress Notes (Signed)
Physical Therapy Treatment Patient Details Name: [name] Bell MRN: [id number] DOB: 1980-11-14 Today's Date: 03/30/2016    History of Present Illness [name] Bell is a 35 y.o. female.She is obese. PMH unknown.Pt attempted suicide: taped DNR note to chest, drank Drano, then jumped off 5 story building ([address] [place]).S/p 02/09/16 gel foam embolization of bil internal iliac arteries.S/p 02/09/16 ex lap with cholecystectomy, resection SB and mesenteric repair, liver lac repair, application or wound vac.S/p 02/09/16 closed reduction of pelvic ring, external pelvic fixation.Also has right metacarpal fracture in splint. Pt on and off intubation via trach/ and on/off trac collar.  Pelvic x- fixator pins removed at bedside on 03/23/16.     PT Comments    Pt very reluctant to transfer with maxi sky total lift OOB to chair.  She has some anxiety over the lift sling.  This, at this time, is the only safe way to get her up and she was ultimately agreeable because we could get her to a make shift BSC to try to have a BM upright.  She did well using PMV throughout session with low/soft voice quality, but much easier to communicate with this today than writing board yesterday.  She did need some breaks as her RR increased (I think more due to anxiety during session), but after short rest, we were able to re apply and use again for better communication.  Pt continues to tolerate sitting EOB better this week and once positioned well EOB can participate more in holding herself up in sitting.  Her very weak core strength is evident in how much assist she needs to get to sitting.  PT will continue to follow acutely and encourage daily EOB/OOB time.    Follow Up Recommendations  CIR     Equipment Recommendations  Wheelchair (measurements PT);Wheelchair cushion (measurements PT);Hospital bed;Other (comment);3in1 (PT) (hoyer lift, bariatric equipment needed)    Recommendations for Other Services Rehab  consult     Precautions / Restrictions Precautions Precautions: Fall Precaution Comments: multiple abdominal wounds, trach, feeding tube, chest tube Required Braces or Orthoses: Other Brace/Splint Other Brace/Splint: L wrist in splint, abdominal binder Restrictions LUE Weight Bearing: Weight bearing as tolerated (in splint) RLE Weight Bearing: Weight bearing as tolerated (for transfers only, no gait) LLE Weight Bearing: Weight bearing as tolerated (for transfers only, no gait)    Mobility  Bed Mobility Overal bed mobility: Needs Assistance Bed Mobility: Supine to Sit Rolling: +2 for physical assistance;Mod assist   Supine to sit: +2 for physical assistance;Max assist     General bed mobility comments: Pt continues to initiate movement of bil LE to EOB, however, unable to physically complete the task or help much with moving her trunk up to sitting even with use of bil upper extremities to essentially hug one therapist while the other therapist helps push her trunk up.  Assist needed to weight shift hips to get square on EOB and feet touching the floor.   Transfers Overall transfer level: Needs assistance               General transfer comment: sitting EOB with maxisky placed. pt lateral leaning to place maxisky pad. Used maxi sky to get to [facility] using recliner chair, blankets on each side and bed pan as there was not a bari BSC available at that time.  Pt was motivated by the thought of having a BM sitting up and therapists tried to be efficient and limit time in the maxi sky lift  given pt's anxiety over the lift sling.          Balance Overall balance assessment: Needs assistance Sitting-balance support: Feet supported;Bilateral upper extremity supported Sitting balance-[name]: Poor Sitting balance - Comments: Breif moments of mni guard assist EOB, however, pt fatigues quickly and goes from hands on knees to hands proped behind her needing assist.  When LE  exercises preformed EOB, increased assist needed at trunk. Can be up to heavy mod assist.  Postural control: Posterior lean                          Cognition Arousal/Alertness: Awake/alert Behavior During Therapy: Anxious;Agitated (at times agitated) Overall Cognitive Status: Impaired/Different from baseline                 General Comments: pt using PMV reports seeing halluications of people trying to kill her. MD notified. Pt reports currently not seeing them in the room during our session. Pt reports not wanting to use the lift but does not give a very defined reason initially stating she prefer to progress slowly. 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 its undignified".  She is aware that the people are not real and reports that she did not have hallucinations before her suicide attempt.  Pt much easier to communicate with today with use of PMV.     Exercises General Exercises - Lower Extremity Long Arc Quad: AAROM;Both;10 reps Hip Flexion/Marching: AAROM;Both;10 reps Toe Raises: AROM;Both;10 reps Heel Raises: AROM;Both;10 reps    General Comments General comments (skin integrity, edema, etc.): Used PMV throuhout session.  When RR increased, gave pt break from PMV to try to calm breathing.       Pertinent Vitals/Pain Pain Assessment: Faces Faces Pain Scale: Hurts even more Pain Location: abdomen and pelvis Pain Descriptors / Indicators: Grimacing;Guarding Pain Intervention(s): Limited activity within patient's tolerance;Monitored during session;Repositioned           PT Goals (current goals can now be found in the care plan section) Acute Rehab PT Goals Patient Stated Goal: to have a BM on the make-shift BSC.  Progress towards PT goals: Progressing toward goals    Frequency  Min 5X/week    PT Plan Current plan remains appropriate    Co-evaluation PT/OT/SLP Co-Evaluation/Treatment: Yes Reason for Co-Treatment:  Complexity of the patient's impairments (multi-system involvement);Necessary to address cognition/behavior during functional activity;For patient/therapist safety PT goals addressed during session: Mobility/safety with mobility;Balance;Strengthening/ROM       End of Session Equipment Utilized During Treatment: Oxygen (trach collar) Activity Tolerance: Patient limited by pain;Other (comment) (limited by anxiety) Patient left: in chair;with call bell/phone within reach     Time: 0951-1050 PT Time Calculation (min) (ACUTE ONLY): 59 min  Charges:  $Therapeutic Activity: 23-37 mins                     Jerryl Holzhauer B. Alexsis Kathman, PT, DPT [phone]   03/30/2016, 4:20 PM

## 2016-03-30 NOTE — Progress Notes (Signed)
Nutrition Follow-up  DOCUMENTATION CODES:   Obesity unspecified  INTERVENTION:   Increase Pivot 1.5 to 60 ml/hr D/C Prostat  Provide: 2160 kcal, 135 grams protein, and 1092 ml H2O.   NUTRITION DIAGNOSIS:   Inadequate oral intake related to inability to eat as evidenced by NPO status. Ongoing.   GOAL:   Patient will meet greater than or equal to 90% of their needs Met.   MONITOR:   I & O's, Diet advancement, Labs, TF tolerance  ASSESSMENT:   Pt from DC with no past medical hx admitted after suicide attempt by ingestion of Drano, 2 bottles of acetaminophen, nyquil, and possibly 6 alprazolam pills and jumping from 5th floor balcony of hotel. 4/3 pt is s/p exp lap, hepatorraphy, SBR, cholecystectomy, preperitoneal pelvic packing, ex fix pelvis, R HPTX, Bil pulmonary contusions. Plan for repeat OR 4/5 for another possible SBR. Abdomen remains open.   4/12 PEG 4/24 trach, abd closed  Pt has now been off vent x 24 hrs, cuff has been deflated to use Passy Muir Pt refuses to get up in lift with therapies.   Pt discussed during ICU rounds and with RN.  Pt complains 2 prostats feels like too much at one time  Medications reviewed and include: iron, KCl Free water 200 ml every 8 hours = 800 ml Labs reviewed: potassium low 3.3 CBG's: 97-125    Diet Order:  Diet NPO time specified  Skin:  Reviewed, no issues (incisions)  Last BM:  5/22  Height:   Ht Readings from Last 1 Encounters:  03/18/16 5' 11" (1.803 m)    Weight:   Wt Readings from Last 1 Encounters:  03/30/16 273 lb 13 oz (124.2 kg)    Ideal Body Weight:  70.4 kg  BMI:  Body mass index is 38.21 kg/(m^2).  Estimated Nutritional Needs:   Kcal:  2100-2300  Protein:  130-150 grams  Fluid:  >2 L/day  EDUCATION NEEDS:   No education needs identified at this time  [place], [place], [place] Pager [phone] After Hours Pager

## 2016-03-30 NOTE — Progress Notes (Signed)
Patient ID: [name] Bell, female   DOB: 08-28-1981, 35 y.o.   MRN: [id number] Follow up - Trauma Critical Care  Patient Details:    [name] Bell is an 35 y.o. female.  Lines/tubes : PICC Double Lumen 03/02/16 PICC Right Cephalic 50 cm 0 cm (Active)  Indication for Insertion or Continuance of Line Prolonged intravenous therapies 03/29/2016  8:00 PM  Exposed Catheter (cm) 0 cm 03/01/2016  8:00 AM  Site Assessment Clean;Dry;Intact 03/29/2016  8:00 PM  Lumen #1 Status Flushed;Saline locked 03/29/2016  8:00 PM  Lumen #2 Status Infusing 03/29/2016  8:00 PM  Dressing Type Transparent 03/29/2016  8:00 PM  Dressing Status Clean;Dry;Intact;Antimicrobial disc in place 03/29/2016  8:00 PM  Line Care Connections checked and tightened 03/29/2016  8:00 PM  Dressing Intervention Dressing changed;Antimicrobial disc changed 03/26/2016  6:00 PM  Dressing Change Due 04/02/16 03/29/2016  8:00 PM     Chest Tube 1 Right Pleural 10 Fr. (Active)  Suction To water seal 03/29/2016  8:00 PM  Patency Intervention Milked 03/29/2016  8:00 PM  Drainage Description Dark red 03/29/2016  8:00 PM  Dressing Status Clean;Dry;Intact 03/29/2016  8:00 PM  Site Assessment Clean;Dry;Intact 03/29/2016  8:00 PM  Surrounding Skin Unable to view 03/29/2016  8:00 PM  Output (mL) 70 mL 03/30/2016  2:00 AM     Closed System Drain Right RUQ Other (Comment) (Active)  Site Description Unable to view 03/28/2016  8:00 AM  Dressing Status Dry;Intact;Old drainage 03/28/2016  8:00 AM  Drainage Appearance Bloody;Thick 03/27/2016  8:00 PM  Status To gravity (Uncharged) 03/28/2016  8:00 AM  Output (mL) 0 mL 03/28/2016  5:45 PM     Gastrostomy/Enterostomy Gastrostomy 24 Fr. LUQ (Active)  Surrounding Skin Dry;Intact 03/29/2016  8:00 PM  Tube Status Patent 03/29/2016  8:00 PM  Drainage Appearance None 03/29/2016  8:00 PM  Dressing Status Clean;Dry;Intact 03/29/2016  8:00 PM  Dressing Intervention Dressing changed 03/29/2016  1:45 AM  Dressing Type Split gauze  03/29/2016  8:00 PM  Dressing Change Due 03/22/16 03/22/2016  8:00 AM  G Port Intake (mL) 30 ml 03/30/2016  4:00 AM  Output (mL) 20 mL 03/14/2016  6:00 AM     Urethral Catheter S. Bowman,RN Latex 16 Fr. (Active)  Indication for Insertion or Continuance of Catheter Other (comment) 03/29/2016  8:00 PM  Site Assessment Clean;Intact 03/29/2016  8:00 PM  Catheter Maintenance Bag below level of bladder;Catheter secured;Drainage bag/tubing not touching floor;Insertion date on drainage bag;No dependent loops;Seal intact 03/29/2016  8:00 PM  Collection Container Standard drainage bag 03/29/2016  8:00 PM  Securement Method Securing device (Describe) 03/29/2016  8:00 PM  Urinary Catheter Interventions Unclamped 03/29/2016  8:00 PM  Output (mL) 450 mL 03/30/2016  6:00 AM    Microbiology/Sepsis markers: Results for orders placed or performed during the hospital encounter of 02/09/16  MRSA PCR Screening     Status: None   Collection Time: 02/11/16 11:30 AM  Result Value Ref Range Status   MRSA by PCR NEGATIVE NEGATIVE Final    Comment:        The GeneXpert MRSA Assay (FDA approved for NASAL specimens only), is one component of a comprehensive MRSA colonization surveillance program. It is not intended to diagnose MRSA infection nor to guide or monitor treatment for MRSA infections.   Anaerobic culture     Status: None   Collection Time: 02/26/16  1:19 PM  Result Value Ref Range Status   Specimen Description FLUID PLEURAL RIGHT  Final  Special Requests POF ANCEF  Final   Gram Stain   Final    DEGENERATED CELLULAR MATERIAL PRESENT NO ORGANISMS SEEN    Culture NO ANAEROBES ISOLATED  Final   Report Status 03/02/2016 FINAL  Final  Body fluid culture     Status: None   Collection Time: 02/26/16  1:19 PM  Result Value Ref Range Status   Specimen Description FLUID RIGHT PLEURAL  Final   Special Requests POF ANCEF  Final   Gram Stain   Final    DEGENERATED CELLULAR MATERIAL PRESENT NO ORGANISMS SEEN     Culture   Final    MULTIPLE ORGANISMS PRESENT, NONE PREDOMINANT CRITICAL RESULT CALLED TO, READ BACK BY AND VERIFIED WITH: J PINEDA 02/27/16 @ 1314 M VESTAL    Report Status 02/28/2016 FINAL  Final  Culture, respiratory (NON-Expectorated)     Status: None   Collection Time: 03/03/16  1:17 PM  Result Value Ref Range Status   Specimen Description TRACHEAL ASPIRATE  Final   Special Requests Normal  Final   Gram Stain   Final    FEW WBC PRESENT,BOTH PMN AND MONONUCLEAR NO SQUAMOUS EPITHELIAL CELLS SEEN NO ORGANISMS SEEN Performed at [facility]    Culture   Final    FEW ENTEROBACTER CLOACAE Performed at [facility]    Report Status 03/06/2016 FINAL  Final   Organism ID, Bacteria ENTEROBACTER CLOACAE  Final      Susceptibility   Enterobacter cloacae - MIC*    CEFAZOLIN >=64 RESISTANT Resistant     CEFEPIME <=1 SENSITIVE Sensitive     CEFTAZIDIME >=64 RESISTANT Resistant     CEFTRIAXONE >=64 RESISTANT Resistant     CIPROFLOXACIN <=0.25 SENSITIVE Sensitive     GENTAMICIN <=1 SENSITIVE Sensitive     IMIPENEM <=0.25 SENSITIVE Sensitive     PIP/TAZO >=128 RESISTANT Resistant     TOBRAMYCIN <=1 SENSITIVE Sensitive     TRIMETH/SULFA Value in next row Sensitive      <=20 SENSITIVE(NOTE)    * FEW ENTEROBACTER CLOACAE  Culture, respiratory (NON-Expectorated)     Status: None   Collection Time: 03/13/16  3:50 PM  Result Value Ref Range Status   Specimen Description TRACHEAL ASPIRATE  Final   Special Requests Normal  Final   Gram Stain   Final    MODERATE WBC PRESENT, PREDOMINANTLY PMN MODERATE SQUAMOUS EPITHELIAL CELLS PRESENT FEW GRAM POSITIVE COCCI IN PAIRS IN CLUSTERS Performed at [facility]    Culture   Final    NORMAL OROPHARYNGEAL FLORA Performed at [facility]    Report Status 03/16/2016 FINAL  Final  Culture, respiratory (NON-Expectorated)     Status: None   Collection Time: 03/17/16 11:32 AM  Result Value Ref Range Status    Specimen Description TRACHEAL ASPIRATE  Final   Special Requests Normal  Final   Gram Stain   Final    FEW WBC PRESENT,BOTH PMN AND MONONUCLEAR RARE SQUAMOUS EPITHELIAL CELLS PRESENT NO ORGANISMS SEEN Performed at [facility]    Culture   Final    NORMAL OROPHARYNGEAL FLORA Performed at [facility]    Report Status 03/19/2016 FINAL  Final  Culture, respiratory (NON-Expectorated)     Status: None   Collection Time: 03/23/16  9:33 AM  Result Value Ref Range Status   Specimen Description TRACHEAL ASPIRATE  Final   Special Requests Normal  Final   Gram Stain   Final    ABUNDANT WBC PRESENT,BOTH PMN AND MONONUCLEAR  FEW SQUAMOUS EPITHELIAL CELLS PRESENT FEW GRAM POSITIVE COCCI IN PAIRS Performed at [facility]    Culture   Final    MODERATE ENTEROBACTER CLOACAE Performed at [facility]    Report Status 03/26/2016 FINAL  Final   Organism ID, Bacteria ENTEROBACTER CLOACAE  Final      Susceptibility   Enterobacter cloacae - MIC*    CEFAZOLIN >=64 RESISTANT Resistant     CEFEPIME <=1 SENSITIVE Sensitive     CEFTAZIDIME <=1 SENSITIVE Sensitive     CEFTRIAXONE <=1 SENSITIVE Sensitive     CIPROFLOXACIN <=0.25 SENSITIVE Sensitive     GENTAMICIN <=1 SENSITIVE Sensitive     IMIPENEM 0.5 SENSITIVE Sensitive     PIP/TAZO 8 SENSITIVE Sensitive     TOBRAMYCIN <=1 SENSITIVE Sensitive     TRIMETH/SULFA Value in next row Sensitive      <=20 SENSITIVE(NOTE)    * MODERATE ENTEROBACTER CLOACAE  Culture, routine-abscess     Status: None   Collection Time: 03/27/16  2:03 PM  Result Value Ref Range Status   Specimen Description ABSCESS RIGHT CHEST DRAINAGE  Final   Special Requests Normal  Final   Gram Stain   Final    ABUNDANT WBC PRESENT, PREDOMINANTLY PMN RARE SQUAMOUS EPITHELIAL CELLS PRESENT ABUNDANT GRAM POSITIVE COCCI IN PAIRS Performed at [facility]    Culture   Final    FEW ENTEROBACTER CLOACAE Performed at [facility]     Report Status 03/30/2016 FINAL  Final   Organism ID, Bacteria ENTEROBACTER CLOACAE  Final      Susceptibility   Enterobacter cloacae - MIC*    CEFAZOLIN >=64 RESISTANT Resistant     CEFEPIME <=1 SENSITIVE Sensitive     CEFTAZIDIME >=64 RESISTANT Resistant     CEFTRIAXONE >=64 RESISTANT Resistant     CIPROFLOXACIN <=0.25 SENSITIVE Sensitive     GENTAMICIN <=1 SENSITIVE Sensitive     IMIPENEM <=0.25 SENSITIVE Sensitive     PIP/TAZO >=128 RESISTANT Resistant     TOBRAMYCIN <=1 SENSITIVE Sensitive     TRIMETH/SULFA Value in next row Sensitive      <=20 SENSITIVE(NOTE)    * FEW ENTEROBACTER CLOACAE    Anti-infectives:  Anti-infectives    Start     Dose/Rate Route Frequency Ordered Stop   03/26/16 1030  cefTRIAXone (ROCEPHIN) 2 g in dextrose 5 % 50 mL IVPB     2 g 100 mL/hr over 30 Minutes Intravenous Every 24 hours 03/26/16 1023     03/25/16 1000  ceFEPIme (MAXIPIME) 2 g in dextrose 5 % 50 mL IVPB  Status:  Discontinued     2 g 100 mL/hr over 30 Minutes Intravenous Every 12 hours 03/25/16 0829 03/26/16 1023   03/08/16 1400  levofloxacin (LEVAQUIN) IVPB 750 mg     750 mg 100 mL/hr over 90 Minutes Intravenous Every 24 hours 03/08/16 0912 03/10/16 1501   03/06/16 1400  ceFEPIme (MAXIPIME) 2 g in dextrose 5 % 50 mL IVPB  Status:  Discontinued     2 g 100 mL/hr over 30 Minutes Intravenous Every 12 hours 03/06/16 1344 03/08/16 0912   03/01/16 1330  ceFAZolin (ANCEF) IVPB 2g/100 mL premix     2 g 200 mL/hr over 30 Minutes Intravenous To [facility] 02/29/16 0917 03/01/16 1452   02/11/16 0600  piperacillin-tazobactam (ZOSYN) IVPB 3.375 g  Status:  Discontinued     3.375 g 100 mL/hr over 30 Minutes Intravenous 4 times per day 02/10/16 2214 02/22/16 1610  02/10/16 1730  piperacillin-tazobactam (ZOSYN) IVPB 3.375 g  Status:  Discontinued     3.375 g 12.5 mL/hr over 240 Minutes Intravenous 3 times per day 02/10/16 1727 02/10/16 2214      Best Practice/Protocols:  VTE Prophylaxis:  Lovenox (prophylaxtic dose)   Consults: Treatment Team:  Myrene Galas, MD Kerin Perna, MD Leata Mouse, MD   Subjective:    Overnight Issues:  Stayed off vent Objective:  Vital signs for last 24 hours: Temp:  [97.8 F (36.6 C)-99.4 F (37.4 C)] 98.4 F (36.9 C) (05/23 0800) Pulse Rate:  [81-96] 86 (05/23 0824) Resp:  [2-30] 22 (05/23 0824) BP: (101-137)/(56-92) 118/75 mmHg (05/23 0824) SpO2:  [93 %-100 %] 99 % (05/23 0824) FiO2 (%):  [28 %-40 %] 28 % (05/23 0824) Weight:  [124.2 kg (273 lb 13 oz)] 124.2 kg (273 lb 13 oz) (05/23 0500)  Hemodynamic parameters for last 24 hours:    Intake/Output from previous day: 05/22 0701 - 05/23 0700 In: 2550 [I.V.:1260; NG/GT:1000; IV Piggyback:50] Out: 3800 [Urine:3620; Chest Tube:180]  Intake/Output this shift:    Vent settings for last 24 hours: Vent Mode:  [-]  FiO2 (%):  [28 %-40 %] 28 %  Physical Exam:  General: alert Neuro: F/C HEENT/Neck: trach-clean, intact Resp: clear to auscultation bilaterally CVS: RRR GI: soft, NT, ND Extremities: edema 4+  Results for orders placed or performed during the hospital encounter of 02/09/16 (from the past 24 hour(s))  Glucose, capillary     Status: Abnormal   Collection Time: 03/29/16 11:44 AM  Result Value Ref Range   Glucose-Capillary 130 (H) 65 - 99 mg/dL   Comment 1 Notify RN    Comment 2 Document in Chart   Glucose, capillary     Status: Abnormal   Collection Time: 03/29/16  3:43 PM  Result Value Ref Range   Glucose-Capillary 103 (H) 65 - 99 mg/dL   Comment 1 Notify RN    Comment 2 Document in Chart   Glucose, capillary     Status: Abnormal   Collection Time: 03/29/16  7:39 PM  Result Value Ref Range   Glucose-Capillary 114 (H) 65 - 99 mg/dL  Glucose, capillary     Status: Abnormal   Collection Time: 03/29/16 11:34 PM  Result Value Ref Range   Glucose-Capillary 107 (H) 65 - 99 mg/dL  Glucose, capillary     Status: Abnormal   Collection Time:  03/30/16  3:41 AM  Result Value Ref Range   Glucose-Capillary 125 (H) 65 - 99 mg/dL  CBC     Status: Abnormal   Collection Time: 03/30/16  6:05 AM  Result Value Ref Range   WBC 8.2 4.0 - 10.5 K/uL   RBC 2.92 (L) 3.87 - 5.11 MIL/uL   Hemoglobin 8.5 (L) 12.0 - 15.0 g/dL   HCT 16.1 (L) 09.6 - 04.5 %   MCV 97.6 78.0 - 100.0 fL   MCH 29.1 26.0 - 34.0 pg   MCHC 29.8 (L) 30.0 - 36.0 g/dL   RDW 40.9 (H) 81.1 - 91.4 %   Platelets 261 150 - 400 K/uL  Basic metabolic panel     Status: Abnormal   Collection Time: 03/30/16  6:05 AM  Result Value Ref Range   Sodium 140 135 - 145 mmol/L   Potassium 3.3 (L) 3.5 - 5.1 mmol/L   Chloride 101 101 - 111 mmol/L   CO2 35 (H) 22 - 32 mmol/L   Glucose, Bld 126 (H) 65 - 99 mg/dL  BUN 16 6 - 20 mg/dL   Creatinine, Ser 1.610.31 (L) 0.44 - 1.00 mg/dL   Calcium 8.2 (L) 8.9 - 10.3 mg/dL   GFR calc non Af Amer >60 >60 mL/min   GFR calc Af Amer >60 >60 mL/min   Anion gap 4 (L) 5 - 15  Glucose, capillary     Status: None   Collection Time: 03/30/16  8:10 AM  Result Value Ref Range   Glucose-Capillary 97 65 - 99 mg/dL   Comment 1 Notify RN    Comment 2 Document in Chart     Assessment & Plan: Present on Admission:  . Fall from, out of or through building, not otherwise specified, initial encounter . Hypovolemic shock (HCC) . Multiple pelvic fractures (HCC) . Left wrist fracture . Multiple fractures of ribs of right side . Fracture of multiple ribs of left side . Liver laceration . Small intestine injury . Suicide attempt (HCC) . Bilateral pulmonary contusion . Acute respiratory failure (HCC) . Ingestion of caustic substance . Tylenol overdose . Hyperglycemia   LOS: 50 days   Additional comments:None Jump from hotel/multiple toxic ingestions - on SSRI, sitter S/P ex lap, hepatorraphy, SBR, cholecystectomy, preperitoneal pelvic packing [name] 4/3  Ex lap for acidosis/bleeding 4/4 [place] Fanfan Ex lap for bleeding 4/5 [name] S/P ex fix pelvis [name]  4/3 S/P ex lap 4/7 Shyenne Maggard S/P ex lap 4/10 Lillyn Wieczorek S/P sacral screws 4/10 [name] S/P application ABRA 4/12 [name]  S/P abd closure, trach 4/24 [name] Resp failure - off vent 24h, cuff has been deflated to use [employer] Corporation[employer] Muir Mult B rib FX/R HPTX, B pulm contusions - IR placed Chest tube drain (03/27/16) for pleural effusion, appreciate TCTS F/U Pneumonia - Enterobacter cloacae - cefepime sensitive Day #6/10 Multiple toxic ingestions Mult L wrist FXs - splint for now Pelvic FX - S/P ex fix and angioembolization, S/P sacral screw by Dr. Carola FrostHandy. Ex fix removed at bedside.  F/U X-rays done. Bed to chair transfers only for another 4-6 weeks, Pivot with to assist with transfers. ABL anemia - up a bit Hyperglycemia - SSI FEN - continue tube feeds, lasix extra dose today VTE - SCD's, Lovenox on hold for chest hemorrhage, surveillance dopplers neg 5/11 Dispo - SDU Critical Care Total Time*: 30 Minutes  [name] Karinna Beadles, MD, MPH, FACS Trauma: [phone] General Surgery: [phone]  03/30/2016  *Care during the described time interval was provided by me. I have reviewed this patient's available data, including medical history, events of note, physical examination and test results as part of my evaluation.

## 2016-03-30 NOTE — Clinical Social Work Psych Note (Signed)
Psych CSW attempted contact with patient's sister, Purnell ShoemakerKaye [phone].  Psych CSW left message and is currently awaiting a return call.  Vickii PennaGina Johonna Binette, LCSW [phone]  2H 1-14; [facility] Line Licensed Clinical Social Worker

## 2016-03-31 ENCOUNTER — Inpatient Hospital Stay (HOSPITAL_COMMUNITY): Payer: Medicaid Other

## 2016-03-31 LAB — GLUCOSE, CAPILLARY
GLUCOSE-CAPILLARY: 144 mg/dL — AB (ref 65–99)
GLUCOSE-CAPILLARY: 109 mg/dL — AB (ref 65–99)
Glucose-Capillary: 110 mg/dL — ABNORMAL HIGH (ref 65–99)
Glucose-Capillary: 122 mg/dL — ABNORMAL HIGH (ref 65–99)
Glucose-Capillary: 124 mg/dL — ABNORMAL HIGH (ref 65–99)

## 2016-03-31 MED ORDER — WHITE PETROLATUM GEL
Status: AC
  Administered 2016-03-31: 10:00:00
  Filled 2016-03-31: qty 1

## 2016-03-31 MED ORDER — SULFAMETHOXAZOLE-TRIMETHOPRIM 200-40 MG/5ML PO SUSP
20.0000 mL | Freq: Four times a day (QID) | ORAL | Status: AC
  Administered 2016-03-31 – 2016-04-03 (×13): 20 mL
  Filled 2016-03-31 (×15): qty 20

## 2016-03-31 MED ORDER — SULFAMETHOXAZOLE-TRIMETHOPRIM 200-40 MG/5ML PO SUSP
40.0000 mL | Freq: Two times a day (BID) | ORAL | Status: DC
  Filled 2016-03-31: qty 40

## 2016-03-31 NOTE — Progress Notes (Signed)
Walking down hallway and noticed patient had pulled her trach out. 02 Saturation initially mid 70's. Respiratory notified, and at bedside. Trach replaced with obturator and 02 back to mid 90's. PA Jeffery notified. When asking patient why she pulled her trach out she stated, "to piss the imaginary man off".  When asking patient if she wants to hurt herself she stated, "yes". AC notified, safety sitter ordered and requested. Vitals stable. Will continue to monitor.

## 2016-03-31 NOTE — Progress Notes (Signed)
Physical Therapy Treatment Patient Details Name: [name] Bell MRN: [id number] DOB: Apr 15, 1981 Today's Date: 03/31/2016    History of Present Illness [name] Bell is a 35 y.o. female.She is obese. PMH unknown.Pt attempted suicide: taped DNR note to chest, drank Drano, then jumped off 5 story building ([address]).S/p 02/09/16 gel foam embolization of bil internal iliac arteries.S/p 02/09/16 ex lap with cholecystectomy, resection SB and mesenteric repair, liver lac repair, application or wound vac.S/p 02/09/16 closed reduction of pelvic ring, external pelvic fixation.Also has right metacarpal fracture in splint. Pt on and off intubation via trach/ and on/off trac collar.  Pelvic x- fixator pins removed at bedside on 03/23/16.     PT Comments    Despite the fact that she did more with PT today, she is more lethargic, RR is higher, and she is not as sharp cognitively today.  She also looks more ashen in color.  I would like to start to introduce and try to use the [facility] electric standing frame if she can tolerate it the next few sessions, even if it is to help her stabilize in sitting and try some pre-standing pressure on bil LEs.    Follow Up Recommendations  CIR     Equipment Recommendations  Wheelchair (measurements PT);Wheelchair cushion (measurements PT);Hospital bed;Other (comment);3in1 (PT) (hoyer lift, bariatric equipment needed.  )    Recommendations for Other Services Rehab consult     Precautions / Restrictions Precautions Precautions: Fall Precaution Comments: multiple abdominal wounds, trach, feeding tube, chest tube Required Braces or Orthoses: Other Brace/Splint Other Brace/Splint: L wrist in splint, abdominal binder Restrictions LUE Weight Bearing: Weight bearing as tolerated (in wrist splint) RLE Weight Bearing: Weight bearing as tolerated (for transfers only, no gait) LLE Weight Bearing: Weight bearing as tolerated (for transfers only, no gait)     Mobility  Bed Mobility Overal bed mobility: Needs Assistance Bed Mobility: Supine to Sit;Sit to Supine Rolling: +2 for physical assistance;Max assist   Supine to sit: +2 for physical assistance;Max assist Sit to supine: +2 for physical assistance;Max assist   General bed mobility comments: Two person max assist to roll bil to donn lift pad, two person max assist to sit EOB.  Pt able to progress one, but not both legs to edge of bed today.  Trunk needs assist to get to sitting and pt encouraged to pull with both arms to get to sitting EOB.   Transfers Overall transfer level: Needs assistance               General transfer comment: Donned maxi move pad EOB today.  Pt is getting closer to being able to try electric standing frame [place] plus)         Balance Overall balance assessment: Needs assistance Sitting-balance support: Feet supported;Bilateral upper extremity supported Sitting balance-[name]: Poor Sitting balance - Comments: Pt was able to support herself for longer periods of time with min guard and close supervision today.  The amount of assistance offered to her to make her work harder in sitting was lessened as well.  She did fatiuge and need more help towards the end EOB time was >10 mins.  Pt was also able to do more seated exercises today.  I would like to start introducing and or trying electric standing frame soon.  Postural control: Posterior lean                          Cognition Arousal/Alertness:  Lethargic Behavior During Therapy: Anxious Overall Cognitive Status: Impaired/Different from baseline                 General Comments: Pt reporting that she is continuing to have hallucinations today, sitter back in room and pt pulled trach out earlier due to hallucination.     Exercises General Exercises - Lower Extremity Long Arc Quad: AAROM;Both;10 reps;Seated (more difficult on her left leg. ) Hip ABduction/ADduction: AROM;Both;10  reps Hip Flexion/Marching: AROM;Both;10 reps Toe Raises: AROM;Both;20 reps Heel Raises: AROM;Both;20 reps        Pertinent Vitals/Pain Pain Assessment: Faces Faces Pain Scale: Hurts even more Pain Location: abdomen and pelvis, pt reporting HA as well Pain Descriptors / Indicators: Grimacing;Guarding Pain Intervention(s): Limited activity within patient's tolerance;Monitored during session;Repositioned           PT Goals (current goals can now be found in the care plan section) Acute Rehab PT Goals Patient Stated Goal: to get up Progress towards PT goals: Progressing toward goals    Frequency  Min 5X/week    PT Plan Current plan remains appropriate       End of Session Equipment Utilized During Treatment: Oxygen (TC with speaking valve) Activity Tolerance: Patient limited by pain;Patient limited by fatigue Patient left: in chair;with call bell/phone within reach;with nursing/sitter in room     Time: 1511-1550 PT Time Calculation (min) (ACUTE ONLY): 39 min  Charges:  $Therapeutic Exercise: 8-22 mins $Therapeutic Activity: 23-37 mins                      Kael Keetch B. Jacarri Gesner, PT, DPT [phone]   03/31/2016, 5:48 PM
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
## 2016-03-31 NOTE — Clinical Social Work Psych Note (Signed)
Psych CSW contacted patient's sister, [name] Bell who reports being on her way to the hospital.  She wasPurnell Bell updated regarding the patient pulling out her trach prior to my contacting her.  Psych CSW and sister was disconnected as it seemed [name] ShoemakerKaye may have been driving through an area with minimal to no cell service.  Psych CSW attempted to contact [name] ShoemakerKaye back to complete assessment.  Message was left.  Psych CSW will continue attempts to speak with sister.  Of note, patient's sister has psych CSW contact information and was advised to feel free to contact psych CSW as needed.  [name] PennaGina Seaborn Nakama, LCSW [phone]  [address]; Hospital Psychiatric Service Line Licensed Clinical Social Worker

## 2016-03-31 NOTE — Progress Notes (Signed)
RT called to room because patient pulled trach out.  [name] Bell was reinserted by RN with obturator before RT arrived to room.  RT auscultated bilateral breath sounds and had positive color change with ETCO2 detector.  RT will continue to monitor.

## 2016-03-31 NOTE — Progress Notes (Signed)
Trauma Service Note  Subjective: Patient pulled out her trach today and is apparently delusional and having hallucinations.    Objective: Vital signs in last 24 hours: Temp:  [97.7 F (36.5 C)-99.2 F (37.3 C)] 98.1 F (36.7 C) (05/24 1156) Pulse Rate:  [81-98] 88 (05/24 1156) Resp:  [17-46] 30 (05/24 1156) BP: (97-142)/(59-101) 141/92 mmHg (05/24 1156) SpO2:  [85 %-99 %] 95 % (05/24 1156) FiO2 (%):  [28 %] 28 % (05/24 1112) Weight:  [128.2 kg (282 lb 10.1 oz)-130 kg (286 lb 9.6 oz)] 128.2 kg (282 lb 10.1 oz) (05/24 0500) Last BM Date: 03/29/16  Intake/Output from previous day: 05/23 0701 - 05/24 0700 In: 2474 [I.V.:1100; RU/EA:5409; IV Piggyback:50] Out: 8119 [Urine:4975; Chest Tube:150] Intake/Output this shift: Total I/O In: 10 [I.V.:10] Out: -   General: Sleeping and breathing rapidly  Lungs: Clear   Abd: Soft, benigh.  G-tube intact  Extremities: No changes  Neuro: Sleepy and cannot reevaluate.  Lab Results: CBC   Recent Labs  03/29/16 0445 03/30/16 0605  WBC 7.0 8.2  HGB 7.7* 8.5*  HCT 25.6* 28.5*  PLT 226 261   BMET  Recent Labs  03/30/16 0605  NA 140  K 3.3*  CL 101  CO2 35*  GLUCOSE 126*  BUN 16  CREATININE 0.31*  CALCIUM 8.2*   PT/INR No results for input(s): LABPROT, INR in the last 72 hours. ABG No results for input(s): PHART, HCO3 in the last 72 hours.  Invalid input(s): PCO2, PO2  Studies/Results: Dg Pelvis 3v Judet  03/31/2016  CLINICAL DATA:  Pelvic ring fracture. EXAM: JUDET PELVIS - 3+ VIEW COMPARISON:  Multiple priors. FINDINGS: Cannulated screws traverse both sacroiliac joints and are stable in position. External fixators have been removed. There appear to be early callus formation across the superior and inferior pubic and ischial fractures. IMPRESSION: Healing pelvic fractures.  External fixators have been removed. Electronically Signed   By: Elsie Stain M.D.   On: 03/31/2016 10:11   Dg Chest Port 1 View  03/31/2016   CLINICAL DATA:  Chest trauma, tracheostomy patient. EXAM: PORTABLE CHEST 1 VIEW COMPARISON:  Portable chest x-ray of Mar 28, 2016 FINDINGS: On the right the remains enlarged pleural effusion-hemothorax without pneumothorax. A small caliber chest tube is in place in the inferior aspect of the hemithorax. Aeration of the right lung has decreased slightly with mild interval increase in the volume of fluid present. On the left the interstitial densities persist. No pleural effusion or pneumothorax is observed. The cardiac silhouette remains enlarged. The pulmonary vascularity remains indistinct. The right PICC line tip projects over the distal third of the SVC. The tracheostomy appliance tip lies at the level of the clavicular heads. Multiple right rib fractures are again demonstrated. IMPRESSION: Slight interval deterioration in the appearance of the right lung with decreased aeration. A moderate-sized hemothorax or pleural effusion persist surrounding the the lung. The right pigtail drainage catheter projects at the level of the hemidiaphragm and is stable. Electronically Signed   By: David  [place]az[place] M.D.   On: 03/31/2016 07:50    Anti-infectives: Anti-infectives    Start     Dose/Rate Route Frequency Ordered Stop   03/31/16 1000  sulfamethoxazole-trimethoprim (BACTRIM,SEPTRA) 200-40 MG/5ML suspension 40 mL  Status:  Discontinued     40 mL Per Tube Every 12 hours 03/31/16 0927 03/31/16 0956   03/31/16 1000  sulfamethoxazole-trimethoprim (BACTRIM,SEPTRA) 200-40 MG/5ML suspension 20 mL     20 mL Per Tube Every 6 hours 03/31/16 0956  03/26/16 1030  cefTRIAXone (ROCEPHIN) 2 g in dextrose 5 % 50 mL IVPB  Status:  Discontinued     2 g 100 mL/hr over 30 Minutes Intravenous Every 24 hours 03/26/16 1023 03/31/16 0921   03/25/16 1000  ceFEPIme (MAXIPIME) 2 g in dextrose 5 % 50 mL IVPB  Status:  Discontinued     2 g 100 mL/hr over 30 Minutes Intravenous Every 12 hours 03/25/16 0829 03/26/16 1023   03/08/16 1400   levofloxacin (LEVAQUIN) IVPB 750 mg     750 mg 100 mL/hr over 90 Minutes Intravenous Every 24 hours 03/08/16 0912 03/10/16 1501   03/06/16 1400  ceFEPIme (MAXIPIME) 2 g in dextrose 5 % 50 mL IVPB  Status:  Discontinued     2 g 100 mL/hr over 30 Minutes Intravenous Every 12 hours 03/06/16 1344 03/08/16 0912   03/01/16 1330  ceFAZolin (ANCEF) IVPB 2g/100 mL premix     2 g 200 mL/hr over 30 Minutes Intravenous To ShortStay Surgical 02/29/16 0917 03/01/16 1452   02/11/16 0600  piperacillin-tazobactam (ZOSYN) IVPB 3.375 g  Status:  Discontinued     3.375 g 100 mL/hr over 30 Minutes Intravenous 4 times per day 02/10/16 2214 02/22/16 0853   02/10/16 1730  piperacillin-tazobactam (ZOSYN) IVPB 3.375 g  Status:  Discontinued     3.375 g 12.5 mL/hr over 240 Minutes Intravenous 3 times per day 02/10/16 1727 02/10/16 2214      Assessment/Plan: s/p Procedure(s): ABDOMINAL WOUND CLOSURE PERCUTANEOUS TRACHEOSTOMY Patient could fail trach collar if she does not participate in therapies.  The question is do we need to place her back on the ventilator.    Needs reevaluation from psychiatry.  LOS: 51 days   Marta LamasJames O. Gae BonWyatt, III, MD, FACS [phone] Trauma Surgeon 03/31/2016

## 2016-03-31 NOTE — Progress Notes (Signed)
Speech Language Pathology Treatment: [name] Speaking valve  Patient Details Name: [name] Bell MRN: [id number] DOB: July 21, 1981 Today's Date: 03/31/2016 Time: [id number] SLP Time Calculation (min) (ACUTE ONLY): 13 min  Assessment / Plan / Recommendation Clinical Impression  Pt wore the speaking valve briefly with small amounts of spontaneous speech. When prompted to speak, she would attempt to comply but had difficulty initiating phonation. Most verbalizations were at a whisper level, reducing intelligibility, although she would slow her rate and add pauses between words to assist. Recommend to continue use with full staff supervision only.   HPI HPI: [name] BobKatia D Pickney is a 35 y.o. female.PMH unknown.Pt attempted suicide: taped DNR note to chest, drank Drano, then jumped off 5 story building ([address]S/p 02/09/16 gel foam embolization of bil internal iliac arteries.S/p 02/09/16 ex lap with cholecystectomy, resection SB and mesenteric repair, liver lac repair, application or wound vac.S/p 02/09/16 closed reduction of pelvic ring, external pelvic fixation.Also has right metacarpal fracture in splint. Has #6 cuffed trach and has tolerated trach collar for 24 hours.      SLP Plan  Continue with current plan of care     Recommendations         Patient may use [name] Speech Valve: Intermittently with supervision PMSV Supervision: Full MD: Please consider changing trach tube to : Cuffless      Oral Care Recommendations: Oral care QID Plan: Continue with current plan of care     GO               Maxcine HamLaura Paiewonsky, M.A. CCC-SLP [phone]  [name], Ninette Cotta 03/31/2016, 1:46 PM

## 2016-03-31 NOTE — Progress Notes (Signed)
Patient ID: [name] Bell, female   DOB: [birth year], 35 y.o.   MRN: [id number]    Referring Physician(s): Dr. Kathlee Nations Trigt  Supervising Physician: Simonne Come  Patient Status: inpt  Chief Complaint: Right pleural effusion  Subjective: Patient pulled her trach out this morning.    Allergies: Review of patient's allergies indicates no known allergies.  Medications: Prior to Admission medications   Not on File    Vital Signs: BP 141/92 mmHg  Pulse 88  Temp(Src) 98.1 F (36.7 C) (Axillary)  Resp 30  Ht  (1.803 m)  Wt 282 lb 10.1 oz (128.2 kg)  BMI 39.44 kg/m2  SpO2 95%  LMP  (LMP Unknown)  Physical Exam: Chest: CTAB, CT in place on right side.  Site is c/d/i.  Drain with 150cc of old bloody drainage in the last 24 hrs  Imaging: Dg Pelvis 3v Judet  03/31/2016  CLINICAL DATA:  Pelvic ring fracture. EXAM: JUDET PELVIS - 3+ VIEW COMPARISON:  Multiple priors. FINDINGS: Cannulated screws traverse both sacroiliac joints and are stable in position. External fixators have been removed. There appear to be early callus formation across the superior and inferior pubic and ischial fractures. IMPRESSION: Healing pelvic fractures.  External fixators have been removed. Electronically Signed   By: Elsie Stain M.D.   On: 03/31/2016 10:11   Dg Chest Port 1 View  03/31/2016  CLINICAL DATA:  Chest trauma, tracheostomy patient. EXAM: PORTABLE CHEST 1 VIEW COMPARISON:  Portable chest x-ray of Mar 28, 2016 FINDINGS: On the right the remains enlarged pleural effusion-hemothorax without pneumothorax. A small caliber chest tube is in place in the inferior aspect of the hemithorax. Aeration of the right lung has decreased slightly with mild interval increase in the volume of fluid present. On the left the interstitial densities persist. No pleural effusion or pneumothorax is observed. The cardiac silhouette remains enlarged. The pulmonary vascularity remains indistinct. The right PICC line  tip projects over the distal third of the SVC. The tracheostomy appliance tip lies at the level of the clavicular heads. Multiple right rib fractures are again demonstrated. IMPRESSION: Slight interval deterioration in the appearance of the right lung with decreased aeration. A moderate-sized hemothorax or pleural effusion persist surrounding the the lung. The right pigtail drainage catheter projects at the level of the hemidiaphragm and is stable. Electronically Signed   By: David  [place]az[place] M.D.   On: 03/31/2016 07:50   Dg Chest Port 1 View  03/28/2016  CLINICAL DATA:  Respiratory failure. EXAM: PORTABLE CHEST 1 VIEW COMPARISON:  Mar 26, 2016 FINDINGS: The tracheostomy tube is in stable position. The right PICC line terminates near the caval atrial junction. Persistent bilateral pulmonary opacity, right greater the left. A right-sided effusion is seen. A pigtail catheter now projects over the right lung base. No change in the cardiomediastinal silhouette. IMPRESSION: 1. Stable support apparatus. 2. No interval change in the bilateral pulmonary opacities and right effusion. New right pigtail catheter projected over the right base. Electronically Signed   By: Gerome Sam III M.D   On: 03/28/2016 10:58   Ct Image Guided Fluid Drain By Catheter  03/27/2016  INDICATION: History of suicide attempt by jumping from 5 story building with multiple injuries including recurrent right-sided hemothorax, post failed right-sided surgically placed chest tube placement. Request made for placement of a CT-guided right-sided chest tube. EXAM: CT IMAGE GUIDED FLUID DRAIN BY CATHETER COMPARISON:  Chest CT- 03/09/2016; chest radiograph - 03/26/2016; 03/24/2016; 03/23/2016 MEDICATIONS: The patient is currently  admitted to the hospital and receiving intravenous antibiotics. The antibiotics were administered within an appropriate time frame prior to the initiation of the procedure. ANESTHESIA/SEDATION: Moderate (conscious) sedation  was employed during this procedure. A total of Versed 3 mg and Fentanyl 100 mcg was administered intravenously. Moderate Sedation Time: 52 minutes. The patient's level of consciousness and vital signs were monitored continuously by radiology nursing throughout the procedure under my direct supervision. CONTRAST:  None COMPLICATIONS: None immediate. PROCEDURE: Informed written consent was obtained from the patient sister after a discussion of the risks, benefits and alternatives to treatment. The patient was placed supine, slightly LPO on the CT gantry and a pre procedural CT was performed re-demonstrating the markedly complex small to moderate size right-sided effusion which is noted to contain innumerable tiny foci of air. The procedure was planned. A timeout was performed prior to the initiation of the procedure. The skin overlying the right lateral chest was prepped and draped in the usual sterile fashion. The overlying soft tissues were anesthetized with 1% lidocaine with epinephrine. Appropriate trajectory was planned with the use of a 22 gauge spinal needle. An 18 gauge trocar needle was advanced into the abscess/fluid collection and a short Amplatz super stiff wire was coiled within the collection. Appropriate positioning was confirmed with a limited CT scan. The tract was serially dilated allowing placement of a 12 [place] all-purpose drainage catheter. Under intermittent CT guidance, the right-sided chest tube was slowly withdrawn into the basilar aspect of the right pleural space. Approximately 20 cc of thick, dark red non foul smelling blood products were aspirated. The tube was connected to a Pleur-vac device and sutured in place. A dressing was placed. The patient tolerated the procedure well without immediate post procedural complication. IMPRESSION: Successful CT guided placement of a 6412 French all purpose drain catheter into the basilar aspect of the right pleural space with aspiration of 20 mL of  thick, dark red, non foul smelling blood products. Samples were sent to the laboratory as requested by the ordering clinical team. Electronically Signed   By: Simonne ComeJohn  [name] M.D.   On: 03/27/2016 14:30    Labs:  CBC:  Recent Labs  03/25/16 0415 03/27/16 0600 03/29/16 0445 03/30/16 0605  WBC 16.6* 8.0 7.0 8.2  HGB 7.1* 6.6* 7.7* 8.5*  HCT 23.5* 22.5* 25.6* 28.5*  PLT 116* 153 226 261    COAGS:  Recent Labs  02/09/16 1040 02/09/16 1430  02/13/16 0600 02/13/16 1620 02/23/16 1926 02/24/16 0415 03/27/16 0445  INR 2.11* 1.99*  < > 2.04* 1.75*  --  1.47 1.52*  APTT 80* 63*  --  36  --  37  --   --   < > = values in this interval not displayed.  BMP:  Recent Labs  03/25/16 0415 03/26/16 0346 03/27/16 0445 03/30/16 0605  NA 136 140 139 140  K 3.9 3.6 3.7 3.3*  CL 99* 104 100* 101  CO2 30 29 30  35*  GLUCOSE 172* 131* 104* 126*  BUN 23* 26* 29* 16  CALCIUM 8.2* 8.1* 8.4* 8.2*  CREATININE 0.59 0.52 0.53 0.31*  GFRNONAA >60 >60 >60 >60  GFRAA >60 >60 >60 >60    LIVER FUNCTION TESTS:  Recent Labs  03/09/16 0530 03/11/16 0415 03/18/16 0423 03/26/16 0346  BILITOT 5.1* 4.8* 3.1* 3.0*  AST 71* 70* 37 37  ALT 41 43 33 28  ALKPHOS 107 115 137* 139*  PROT 5.6* 5.7* 5.8* 5.7*  ALBUMIN 1.2* 1.2* 1.4* 1.3*  Assessment and Plan: S/P placement of right chest tube by Dr. Grace Isaac on 03/27/2016 for recurrent pleural effusion.  Continue current care. Will continue to follow along with CT surgery recommendations.  Electronically Signed: Letha Cape 03/31/2016, 12:05 PM   I spent a total of 15 Minutes at the the patient's bedside AND on the patient's hospital floor or unit, greater than 50% of which was counseling/coordinating care for right pleural effusion

## 2016-03-31 NOTE — Progress Notes (Signed)
Pt pulled her trach out.  Stoma bleeding and appeared to start closing.  Lube used, trach replaced with obturator with minimal resistance.  Site is still bloody but cleaned up and gauze replaced.  Pt did desat to 86% on RA.  VS stable with SpO2 of 96% on 60% FiO2.

## 2016-04-01 LAB — GLUCOSE, CAPILLARY
GLUCOSE-CAPILLARY: 129 mg/dL — AB (ref 65–99)
GLUCOSE-CAPILLARY: 110 mg/dL — AB (ref 65–99)
GLUCOSE-CAPILLARY: 135 mg/dL — AB (ref 65–99)
Glucose-Capillary: 104 mg/dL — ABNORMAL HIGH (ref 65–99)
Glucose-Capillary: 134 mg/dL — ABNORMAL HIGH (ref 65–99)

## 2016-04-01 MED ORDER — CLONAZEPAM 0.5 MG PO TABS
0.5000 mg | ORAL_TABLET | Freq: Three times a day (TID) | ORAL | Status: DC
Start: 2016-04-01 — End: 2016-04-12
  Administered 2016-04-01 – 2016-04-11 (×27): 0.5 mg
  Filled 2016-04-01 (×31): qty 1

## 2016-04-01 MED ORDER — FENTANYL 75 MCG/HR TD PT72: 75.0000 ug | MEDICATED_PATCH | TRANSDERMAL | Status: DC

## 2016-04-01 MED ORDER — ESCITALOPRAM OXALATE 10 MG PO TABS
10.0000 mg | ORAL_TABLET | Freq: Every day | ORAL | Status: DC
  Administered 2016-04-02 – 2016-04-11 (×8): 10 mg
  Filled 2016-04-01 (×10): qty 1

## 2016-04-01 MED ORDER — WHITE PETROLATUM GEL
Status: AC
Start: 2016-04-01 — End: 2016-04-01
  Administered 2016-04-01: 1
  Filled 2016-04-01: qty 1

## 2016-04-01 MED ORDER — QUETIAPINE FUMARATE 50 MG PO TABS
25.0000 mg | ORAL_TABLET | Freq: Two times a day (BID) | ORAL | Status: DC
  Administered 2016-04-01 – 2016-04-11 (×18): 25 mg
  Filled 2016-04-01 (×21): qty 1

## 2016-04-01 MED ORDER — SERTRALINE HCL 100 MG PO TABS
100.0000 mg | ORAL_TABLET | Freq: Every day | ORAL | Status: DC
  Administered 2016-04-02 – 2016-06-03 (×59): 100 mg via ORAL
  Filled 2016-04-01 (×20): qty 1
  Filled 2016-04-01: qty 2
  Filled 2016-04-01 (×41): qty 1

## 2016-04-01 NOTE — Progress Notes (Signed)
Physical Therapy Treatment Patient Details Name: [name] Bell MRN: [id number] DOB: 09/01/1981 Today's Date: 04/01/2016    History of Present Illness [name] Bell is a 35 y.o. female.She is obese. PMH unknown.Pt attempted suicide: taped DNR note to chest, drank Drano, then jumped off 5 story building ([address]).S/p 02/09/16 gel foam embolization of bil internal iliac arteries.S/p 02/09/16 ex lap with cholecystectomy, resection SB and mesenteric repair, liver lac repair, application or wound vac.S/p 02/09/16 closed reduction of pelvic ring, external pelvic fixation.Also has right metacarpal fracture in splint. Pt on and off intubation via trach/ and on/off trac collar.  Pelvic x- fixator pins removed at bedside on 03/23/16.     PT Comments    Pt more lethargic today than even yesterday. I was hopeful to try to start supported standing, but unable to do that today due to lethargy and decreased participation.  Pt's O2 sats, RR, HR, and BP all remained stable.  Pt pulled out her trach again last night and was given Haldol x 2 doses and restrained (wrist and mittens).  She also had pain medication a few hours before this session.  I am wondering if all of that medication is still wearing off.  PT will continue to follow acutely, but may have to decrease frequency if alertness and participation during our sessions continue to decrease.    Follow Up Recommendations  CIR     Equipment Recommendations  Wheelchair (measurements PT);Wheelchair cushion (measurements PT);Hospital bed;Other (comment);3in1 (PT) (hoyer lift)    Recommendations for Other Services Rehab consult     Precautions / Restrictions Precautions Precautions: Fall Precaution Comments: multiple abdominal wounds, trach, feeding tube, chest tube Required Braces or Orthoses: Other Brace/Splint Other Brace/Splint: L wrist in splint, abdominal binder Restrictions LUE Weight Bearing: Weight bearing as tolerated (in left wrist  splint) RLE Weight Bearing: Weight bearing as tolerated (for transfers only, no gait) LLE Weight Bearing: Weight bearing as tolerated (for transfers only, no gait)    Mobility  Bed Mobility Overal bed mobility: Needs Assistance;+2 for physical assistance Bed Mobility: Supine to Sit     Supine to sit: +2 for physical assistance;Total assist     General bed mobility comments: Pt did not assist at all with supine to sit transition.    Transfers Overall transfer level: Needs assistance               General transfer comment: Donned maxi sky pad EOB.           Balance Overall balance assessment: Needs assistance Sitting-balance support: Feet supported;Bilateral upper extremity supported;No upper extremity supported Sitting balance-[name] Bell: Poor Sitting balance - Comments: Pt sat EOB with heavier assist today.  She was able to for breif periods of time, participate in attempting to hold herself up.  She did not talk, mild grimacing.  Due to poor ability to maintain eyes open we did not attempt LE exercises today.  We did lift her OOB to chair to get the benefits of being that upright.   Postural control: Posterior lean                          Cognition Arousal/Alertness: Lethargic Behavior During Therapy: Flat affect Overall Cognitive Status: Impaired/Different from baseline Area of Impairment: Attention;Memory;Following commands;Safety/judgement;Awareness;Problem solving   Current Attention Level: Focused   Following Commands: Follows one step commands inconsistently;Follows one step commands with increased time     Problem Solving: Slow processing;Decreased initiation;Difficulty sequencing;Requires verbal  cues;Requires tactile cues General Comments: Pt had a rough night, possible that she can be coming off of medications (haldol and pain meds?)           Pertinent Vitals/Pain Pain Assessment: Faces Faces Pain Bell: Hurts little more Pain Location:  abdomen, pelvis, generalized Pain Descriptors / Indicators: Guarding;Grimacing Pain Intervention(s): Limited activity within patient's tolerance;Monitored during session;Repositioned;Premedicated before session           PT Goals (current goals can now be found in the care plan section) Acute Rehab PT Goals Patient Stated Goal: unable to state anything today even with PMV on entire session.  Nods at times to questions Progress towards PT goals: Not progressing toward goals - comment (increased lethargy today)    Frequency  Min 5X/week    PT Plan Current plan remains appropriate       End of Session Equipment Utilized During Treatment: Oxygen;Other (comment) (TC with speaking valve) Activity Tolerance: Patient limited by fatigue;Patient limited by lethargy Patient left: in chair;with call bell/phone within reach;with restraints reapplied;with nursing/sitter in room     Time: 1100-1144 PT Time Calculation (min) (ACUTE ONLY): 44 min  Charges:  $Therapeutic Activity: 38-52 mins                      Ginnie Marich B. Cohan Stipes, PT, DPT [phone]   04/01/2016, 5:09 PM

## 2016-04-01 NOTE — Consult Note (Signed)
[facility] Face-to-Face Psychiatry Consult follow-up   Reason for Consult:  Suicide attempt Referring Physician:  Trauma MD Patient Identification: [name] Bell MRN:  [id number] Principal Diagnosis: Suicide attempt [facility]) Diagnosis:   Patient Active Problem List   Diagnosis Date Noted  . Multiple pelvic fractures ([place]) [S32.810A] 03/03/2016  . Left wrist fracture [S62.102A] 03/03/2016  . Multiple fractures of ribs of right side [S22.41XA] 03/03/2016  . Fracture of multiple ribs of left side [S22.42XA] 03/03/2016  . Liver laceration [S36.113A] 03/03/2016  . Small intestine injury [S36.409A] 03/03/2016  . Acute blood loss anemia [D62] 03/03/2016  . Suicide attempt ([place]) [T14.91] 03/03/2016  . Bilateral pulmonary contusion [S27.322A] 03/03/2016  . Acute respiratory failure ([place]) [J96.00] 03/03/2016  . Hypokalemia [E87.6] 03/03/2016  . Hypernatremia [E87.0] 03/03/2016  . Acute kidney injury ([place]) [N17.9] 03/03/2016  . Ingestion of caustic substance [T54.91XA] 03/03/2016  . Tylenol overdose [T39.1X4A] 03/03/2016  . Hyperglycemia [R73.9] 03/03/2016  . Hepatic failure ([place]) [K72.90] 03/03/2016  . Pressure ulcer [L89.90] 02/26/2016  . Fall from, out of or through building, not otherwise specified, initial encounter [W13.9XXA] 02/09/2016  . Hypovolemic shock ([place]) [R57.1] 02/09/2016    Total Time spent with patient: 30 minutes  Subjective:   DYNASTIE Bell is a 35 y.o. female patient admitted with status post suicidal attempt.  HPI:  [name] Bell is a 35 years old female admitted to [facility] Since 02/09/2016,status post suicidal attempt, jumped from her 5th floor hotel room. Patient required multiple surgical interventions regarding pelvic fractures, left wrist fracture multiple fractures of relapse of right side and left side and also has a liver laceration and small intestine injury And hypovolemic's shock Patient required intubation in and out several times due to difficulty to  weaning off at this time. Reportedly patient sister has been supportive and in and out into the hospital Patient sister believes she continued to feel suicidal ideation. Patient is under sedation secondary to medication during my evaluation and intermittently able to stay awake briefly and nodding her head for the questions Patient endorses being depressed but denies being currently suicidal. Staff RN reported patient had nodding his seems to be more appropriate to the questions asking her. We will try to reach patient's sister to get more psychosocial stressors and also reevaluate the patient when she becomes more  communicative without sedation. Past Psychiatric History: Review of medical records indicated patient has no previous psychiatric admission health Center.  04/01/2016  Interval history: Patient seen today for psychiatric consultation follow-up and also case discussed with staff RN. Patient appeared sitting in a chair with the tracheostomy tube intact. Patient could not talk verbally but awake, alert oriented and communicated by nodding her head. Patient has been suffering with increased symptoms of depression, anxiety and suicidal behaviors like pulling her tracheostomy tube at least twice with intention to end her life. Patient will be considered acutely suicidal and needed one-to-one observation at this time. We will increase her Zoloft 200 mg daily and continue Seroquel 25 mg 3 times daily and closely monitor for further psychiatric needs at this time. Patient is currently under involuntary commitment and [facility]    Risk to Self: Is patient at risk for suicide?: Yes Risk to Others:   Prior Inpatient Therapy:   Prior Outpatient Therapy:    Past Medical History: History reviewed. No pertinent past medical history.  Past Surgical History  Procedure Laterality Date  . Esophagogastroduodenoscopy N/A 02/10/2016    Procedure: ESOPHAGOGASTRODUODENOSCOPY  (  EGD);  Surgeon: Doran Stabler, MD;  Location: [facility] ENDOSCOPY;  Service: Endoscopy;  Laterality: N/A;  . Laparotomy N/A 02/10/2016    Procedure: EXPLORATORY LAPAROTOMY, removal of packs,  cauterization of liver, repacking of liver, and open abdomen vac application;  Surgeon: Georganna Skeans, MD;  Location: [place];  Service: General;  Laterality: N/A;  . Laparotomy N/A 02/11/2016    Procedure: EXPLORATORY LAPAROTOMY VAC CHANGE ;  Surgeon: Judeth Horn, MD;  Location: [place];  Service: General;  Laterality: N/A;  . Chest tube insertion Right 02/11/2016    Procedure: CHEST TUBE INSERTION;  Surgeon: Judeth Horn, MD;  Location: [place];  Service: General;  Laterality: Right;  . Laparotomy N/A 02/13/2016    Procedure: EXPLORATORY LAPAROTOMY, REMOVAL OF PACKS, ABDOMINAL VAC DRESSING CHANGE;  Surgeon: Georganna Skeans, MD;  Location: [place];  Service: General;  Laterality: N/A;  . Laparotomy N/A 02/18/2016    Procedure: EXPLORATORY LAPAROTOMY, PLACEMENT OF ABRA ABDOMINAL WALL CLOSURE SET;  Surgeon: Judeth Horn, MD;  Location: [place];  Service: General;  Laterality: N/A;  . Chest tube insertion Right 02/26/2016    Procedure: CHEST TUBE INSERTION;  Surgeon: Ivin Poot, MD;  Location: [place];  Service: Thoracic;  Laterality: Right;  . Wound debridement N/A 03/01/2016    Procedure: ABDOMINAL WOUND CLOSURE;  Surgeon: Judeth Horn, MD;  Location: [place];  Service: General;  Laterality: N/A;  . Percutaneous tracheostomy N/A 03/01/2016    Procedure: PERCUTANEOUS TRACHEOSTOMY;  Surgeon: Judeth Horn, MD;  Location: [place];  Service: General;  Laterality: N/A;  . Laparotomy N/A 02/09/2016    Procedure: EXPLORATORY LAPAROTOMY;  Surgeon: Judeth Horn, MD;  Location: [place];  Service: General;  Laterality: N/A;  . Cholecystectomy  02/09/2016    Procedure: CHOLECYSTECTOMY;  Surgeon: Judeth Horn, MD;  Location: [place];  Service: General;;  . Bowel resection  02/09/2016    Procedure: SMALL BOWEL RESECTION, MESENTERIC REPAIR;  Surgeon: Judeth Horn, MD;  Location: [place];  Service: General;;  . Application of wound vac  02/09/2016    Procedure: APPLICATION OF WOUND VAC;  Surgeon: Judeth Horn, MD;  Location: [place];  Service: General;;  . Laceration repair  02/09/2016    Procedure: REPAIR LIVER LACERATION;  Surgeon: Judeth Horn, MD;  Location: [place];  Service: General;;  . External fixation pelvis  02/09/2016    Procedure: EXTERNAL FIXATION PELVIS;  Surgeon: Altamese [place], MD;  Location: [place];  Service: Orthopedics;;  . Laparotomy N/A 02/16/2016    Procedure: EXPLORATORY LAPAROTOMY, ABDOMINAL [place];  Surgeon: Georganna Skeans, MD;  Location: [place];  Service: General;  Laterality: N/A;  . Application of wound vac N/A 02/16/2016    Procedure: RE-APPLICATION OF WOUND VAC;  Surgeon: Georganna Skeans, MD;  Location: [place];  Service: General;  Laterality: N/A;  . Sacro-iliac pinning Right 02/16/2016    Procedure: [place];  Surgeon: Altamese [place], MD;  Location: [place];  Service: Orthopedics;  Laterality: Right;   Family History: History reviewed. No pertinent family history. Family Psychiatric  History: Unknown Social History:  History  Alcohol Use: Not on file    Comment: unknown     History  Drug Use Not on file    Social History   Social History  . Marital Status: Unknown    Spouse Name: N/A  . Number of Children: N/A  . Years of Education: N/A   Social History Main Topics  . Smoking status: Unknown If Ever Smoked  . Smokeless  tobacco: None  . Alcohol Use: None     Comment: unknown  . Drug Use: None  . Sexual Activity: Not Asked   Other Topics Concern  . None   Social History Narrative   Additional Social History:    Allergies:  No Known Allergies  Labs:  Results for orders placed or performed during the hospital encounter of 02/09/16 (from the past 48 hour(s))  Glucose, capillary     Status: Abnormal   Collection Time: 03/30/16 11:49 AM  Result Value Ref Range   Glucose-Capillary 107 (H) 65 - 99 mg/dL    Comment 1 Notify RN    Comment 2 Document in Chart   Glucose, capillary     Status: Abnormal   Collection Time: 03/30/16  4:03 PM  Result Value Ref Range   Glucose-Capillary 117 (H) 65 - 99 mg/dL   Comment 1 Notify RN    Comment 2 Document in Chart   MRSA PCR Screening     Status: None   Collection Time: 03/30/16  6:51 PM  Result Value Ref Range   MRSA by PCR NEGATIVE NEGATIVE    Comment:        The GeneXpert MRSA Assay (FDA approved for NASAL specimens only), is one component of a comprehensive MRSA colonization surveillance program. It is not intended to diagnose MRSA infection nor to guide or monitor treatment for MRSA infections.   Glucose, capillary     Status: Abnormal   Collection Time: 03/30/16  7:18 PM  Result Value Ref Range   Glucose-Capillary 117 (H) 65 - 99 mg/dL  Glucose, capillary     Status: Abnormal   Collection Time: 03/30/16 11:32 PM  Result Value Ref Range   Glucose-Capillary 128 (H) 65 - 99 mg/dL  Glucose, capillary     Status: Abnormal   Collection Time: 03/31/16  2:53 AM  Result Value Ref Range   Glucose-Capillary 110 (H) 65 - 99 mg/dL  Glucose, capillary     Status: Abnormal   Collection Time: 03/31/16  8:03 AM  Result Value Ref Range   Glucose-Capillary 144 (H) 65 - 99 mg/dL  Glucose, capillary     Status: Abnormal   Collection Time: 03/31/16 11:50 AM  Result Value Ref Range   Glucose-Capillary 122 (H) 65 - 99 mg/dL   Comment 1 Notify RN    Comment 2 Document in Chart   Glucose, capillary     Status: Abnormal   Collection Time: 03/31/16  4:25 PM  Result Value Ref Range   Glucose-Capillary 109 (H) 65 - 99 mg/dL   Comment 1 Notify RN   Glucose, capillary     Status: Abnormal   Collection Time: 03/31/16  8:52 PM  Result Value Ref Range   Glucose-Capillary 124 (H) 65 - 99 mg/dL  Glucose, capillary     Status: Abnormal   Collection Time: 03/31/16 11:30 PM  Result Value Ref Range   Glucose-Capillary 134 (H) 65 - 99 mg/dL  Glucose,  capillary     Status: Abnormal   Collection Time: 04/01/16  3:51 AM  Result Value Ref Range   Glucose-Capillary 129 (H) 65 - 99 mg/dL  Glucose, capillary     Status: Abnormal   Collection Time: 04/01/16  7:51 AM  Result Value Ref Range   Glucose-Capillary 110 (H) 65 - 99 mg/dL   Comment 1 Document in Chart     Current Facility-Administered Medications  Medication Dose Route Frequency Provider Last Rate Last Dose  . 0.45 % sodium  chloride infusion   Intravenous Continuous Judeth Horn, MD 50 mL/hr at 03/31/16 1620    . 0.9 %  sodium chloride infusion   Intravenous Once Judeth Horn, MD      . 0.9 %  sodium chloride infusion   Intravenous Once Arta Bruce Kinsinger, MD      . acetaminophen (TYLENOL) solution 650 mg  650 mg Oral Q8H PRN Judeth Horn, MD   650 mg at 03/25/16 1953  . bisacodyl (DULCOLAX) suppository 10 mg  10 mg Rectal Daily PRN Judeth Horn, MD      . chlorhexidine gluconate (SAGE KIT) (PERIDEX) 0.12 % solution 15 mL  15 mL Mouth Rinse BID Ralene Ok, MD   15 mL at 04/01/16 0800  . clonazePAM (KLONOPIN) tablet 1 mg  1 mg Per Tube BID Judeth Horn, MD   1 mg at 04/01/16 0933  . diphenhydrAMINE (BENADRYL) 12.5 MG/5ML elixir 25 mg  25 mg Per Tube Q6H PRN Georganna Skeans, MD   25 mg at 03/30/16 2254  . escitalopram (LEXAPRO) tablet 20 mg  20 mg Per Tube Daily Judeth Horn, MD   20 mg at 04/01/16 0933  . feeding supplement (PIVOT 1.5 CAL) liquid 1,000 mL  1,000 mL Per Tube Continuous Georganna Skeans, MD 60 mL/hr at 04/01/16 0504 1,000 mL at 04/01/16 0504  . fentaNYL (DURAGESIC - dosed mcg/hr) 50 mcg  50 mcg Transdermal Q72H Judeth Horn, MD   50 mcg at 04/01/16 0934  . ferrous sulfate 300 (60 Fe) MG/5ML syrup 300 mg  300 mg Per Tube BID WC Skeet Simmer, RPH   300 mg at 04/01/16 0801  . free water 200 mL  200 mL Per Tube Q8H Georganna Skeans, MD   200 mL at 04/01/16 0600  . furosemide (LASIX) 10 MG/ML solution 40 mg  40 mg Oral Daily Lisette Abu, PA-C   40 mg at 04/01/16 9643  .  guaiFENesin (ROBITUSSIN) 100 MG/5ML solution 300 mg  15 mL Per Tube Q6H Georganna Skeans, MD   300 mg at 04/01/16 0542  . HYDROmorphone (DILAUDID) injection 0.5 mg  0.5 mg Intravenous Q4H PRN Lisette Abu, PA-C   0.5 mg at 04/01/16 0650  . insulin aspart (novoLOG) injection 0-20 Units  0-20 Units Subcutaneous 6 times per day Georganna Skeans, MD   3 Units at 04/01/16 0352  . ipratropium-albuterol (DUONEB) 0.5-2.5 (3) MG/3ML nebulizer solution 3 mL  3 mL Nebulization Q6H PRN Judeth Horn, MD   3 mL at 03/19/16 1943  . LORazepam (ATIVAN) injection 1-2 mg  1-2 mg Intravenous Q4H PRN Georganna Skeans, MD   2 mg at 04/01/16 0006  . ondansetron (ZOFRAN) tablet 4 mg  4 mg Oral Q6H PRN Lisette Abu, PA-C       Or  . ondansetron [facility]) injection 4 mg  4 mg Intravenous Q6H PRN Lisette Abu, PA-C   4 mg at 03/30/16 2138  . oxyCODONE (ROXICODONE) 5 MG/5ML solution 10-20 mg  10-20 mg Per Tube Q4H PRN Lisette Abu, PA-C   20 mg at 04/01/16 0801  . pantoprazole sodium (PROTONIX) 40 mg/20 mL oral suspension 40 mg  40 mg Per Tube Daily Lisette Abu, PA-C   40 mg at 04/01/16 8381  . potassium chloride 20 MEQ/15ML (10%) solution 40 mEq  40 mEq Oral BID Georganna Skeans, MD   40 mEq at 03/31/16 2213  . QUEtiapine (SEROQUEL) tablet 25 mg  25 mg Per Tube TID Judeth Horn, MD  25 mg at 04/01/16 0933  . sertraline (ZOLOFT) tablet 50 mg  50 mg Oral Daily Ambrose Finland, MD   50 mg at 04/01/16 0933  . sodium chloride flush (NS) 0.9 % injection 10-40 mL  10-40 mL Intracatheter Q12H Georganna Skeans, MD   10 mL at 04/01/16 1000  . sodium chloride flush (NS) 0.9 % injection 10-40 mL  10-40 mL Intracatheter PRN Georganna Skeans, MD   10 mL at 03/19/16 0404  . sulfamethoxazole-trimethoprim (BACTRIM,SEPTRA) 200-40 MG/5ML suspension 20 mL  20 mL Per Tube Q6H Judeth Horn, MD   20 mL at 04/01/16 0933    Musculoskeletal: Strength & Muscle Tone: decreased Gait & Station: unable to stand Patient leans:  N/A  Psychiatric Specialty Exam: Review of Systems  Unable to perform ROS   Blood pressure 155/89, pulse 84, temperature 99.2 F (37.3 C), temperature source Oral, resp. rate 32, height 5' 11"  (1.803 m), weight 283 lb 8.2 oz (128.6 kg), SpO2 100 %.Body mass index is 39.56 kg/(m^2).  General Appearance: Guarded  Eye Contact::  Minimal  Speech:  NA and communicate briefly nodding her head.  Volume:  not applicable  Mood:  Depressed  Affect:  Depressed and Flat  Thought Process:  Coherent  Orientation:  Negative  Thought Content:  Rumination  Suicidal Thoughts:  Yes.  with intent/plan  Homicidal Thoughts:  No  Memory:  Immediate;   Fair Recent;   Poor  Judgement:  Impaired  Insight:  NA  Psychomotor Activity:  Psychomotor Retardation and Restlessness  Concentration:  Fair  Recall:  Poor  Fund of Knowledge:Fair  Language: Fair  Akathisia:  Negative  Handed:  Right  AIMS (if indicated):     Assets:  Others:  deferred  ADL's:  Impaired  Cognition: Impaired,  Mild  Sleep:      Treatment Plan Summary: Increase Zoloft 100 mg daily which can be increased to up to 200 mg as clinically required for depression and posttraumatic stress disorder Continue Seroquel 25 mg 3 times daily for agitation and aggressive behaviors Continue safety sitter as patient exhibited dangerous and disruptive behaviors like pulling away her tracheostomy tube Patient [facility] be [facility] as per medical director and [place] psychiatric consultation and follow up as clinically required Please contact [phone] or [phone] if needs further assistance  Disposition: Patient will be [facility] as per medical director and LCSW secondary to deterioration of her behaviors and self-injurious behaviors by pulling away her tracheostomy tube Supportive therapy provided about ongoing stressors.  Ambrose Finland, MD 04/01/2016 10:18 AM

## 2016-04-01 NOTE — Progress Notes (Signed)
Patient still attempting to pull out trach and other invasive lines. MD notified orders for restaints obtained Patient family aware. Explained to patient need for restaint to keep patient safe. Patient indicated undestanding

## 2016-04-01 NOTE — Clinical Social Work Psych Note (Addendum)
[facility] CS[facility].  [place] authorization received [id number].  [facility] and [facility] referral faxed in.  Patient currently under review.  Psych CSW completed Involuntary Commitment paperwork and placed on the chart for psychiatry to sign.  Psychiatrist updated.  Vickii PennaGina Dionisio Aragones, LCSW [phone]  5N 24-32; Hospital Psychiatric Service Line Licensed Clinical Social Worker

## 2016-04-01 NOTE — Progress Notes (Signed)
Patient ID: [name] Bell, female   DOB: 12/14/1980, 35 y.o.   MRN: [id number]   LOS: 52 days   Subjective: Didn't or wouldn't wake up for me.   Objective: Vital signs in last 24 hours: Temp:  [98 F (36.7 C)-99.2 F (37.3 C)] 99.2 F (37.3 C) (05/25 0800) Pulse Rate:  [64-95] 84 (05/25 0813) Resp:  [18-34] 32 (05/25 0813) BP: (129-155)/(70-92) 155/89 mmHg (05/25 0800) SpO2:  [95 %-100 %] 100 % (05/25 0813) FiO2 (%):  [28 %-40 %] 40 % (05/25 0813) Weight:  [128.6 kg (283 lb 8.2 oz)] 128.6 kg (283 lb 8.2 oz) (05/25 0500) Last BM Date: 03/29/16   CT No air leak 17020ml/24h @790ml    Physical Exam General appearance: no distress Resp: rhonchi bilaterally Cardio: regular rate and rhythm GI: Soft, +BS, midline wound granulating well, PEG in place   Assessment/Plan: Jump from hotel/multiple toxic ingestions - on SSRI, sitter for safety. Will check with psych regarding capacity and ability to refuse care at this point S/P ex lap, hepatorraphy, SBR, cholecystectomy, preperitoneal pelvic packing [name] 4/3  Ex lap for acidosis/bleeding 4/4 [place] Ex lap for bleeding 4/5 [name] S/P ex fix pelvis [name] 4/3 S/P ex lap 4/7 [place] S/P ex lap 4/10 [place] S/P sacral screws 4/10 [name] S/P application ABRA 4/12 [name]  S/P abd closure, trach 4/24 [name] Resp failure - tolerating TC Mult B rib FX/R HPTX, B pulm contusions - IR placed Chest tube drain (03/27/16) for pleural effusion, appreciate TCTS F/U Pneumonia - Enterobacter cloacae - on Septra Day #8/10 Multiple toxic ingestions Mult L wrist FXs - splint until f/u with Dr. Melvyn Novasrtmann as OP, can WBAT Pelvic FX - S/P ex fix and angioembolization, S/P sacral screw by Dr. Carola FrostHandy. Bed to chair transfers only for another 4-6 weeks, Pivot with to assist with transfers. ABL anemia - Stable Hyperglycemia - Resolved FEN - continue tube feeds, will come down on Klonopin and Seroquel, wean Duragesic at next patch change VTE - SCD's, Lovenox  on hold for chest hemorrhage, surveillance dopplers neg 5/11 Dispo - SDU    Freeman CaldronMichael J. Spenser Cong, PA-C Pager: [phone] General Trauma PA Pager: [phone]  04/01/2016

## 2016-04-02 DIAGNOSIS — R45851 Suicidal ideations: Secondary | ICD-10-CM

## 2016-04-02 DIAGNOSIS — X80XXXA Intentional self-harm by jumping from a high place, initial encounter: Secondary | ICD-10-CM

## 2016-04-02 MED ORDER — FENTANYL 25 MCG/HR TD PT72
25.0000 ug | MEDICATED_PATCH | TRANSDERMAL | Status: AC
  Administered 2016-04-04: 25 ug via TRANSDERMAL
  Filled 2016-04-02: qty 1

## 2016-04-02 MED ORDER — FLUCONAZOLE 40 MG/ML PO SUSR
400.0000 mg | Freq: Once | ORAL | Status: AC
  Administered 2016-04-02: 400 mg
  Filled 2016-04-02: qty 10

## 2016-04-02 NOTE — Progress Notes (Signed)
Physical Therapy Treatment Patient Details Name: [name] Bell MRN: [id number] DOB: 1981-09-28 Today's Date: 04/02/2016    History of Present Illness [name] Bell is a 35 y.o. female.She is obese. PMH unknown.Pt attempted suicide: taped DNR note to chest, drank Drano, then jumped off 5 story building ([address]).S/p 02/09/16 gel foam embolization of bil internal iliac arteries.S/p 02/09/16 ex lap with cholecystectomy, resection SB and mesenteric repair, liver lac repair, application or wound vac.S/p 02/09/16 closed reduction of pelvic ring, external pelvic fixation.Also has right metacarpal fracture in splint. Pt on and off intubation via trach/ and on/off trac collar.  Pelvic x- fixator pins removed at bedside on 03/23/16.     PT Comments    Pt is back on track for continued progression after two days of decreased ability to participate and lethargy.  [name] was more alert, participating, following commands and attempting to talk toady during PT/SLP session.  She did well attempting to support herself in sitting and progressing her LE seated exercises.  As long as her progression continues over the weekend, I would like to try to use the [place] [employer] to attempt pre-standing by getting her to [facility] a little through her legs in an effort to squat or initiate squat from EOB.  She may not be able to get both legs into the [employer] (as she is so abducted at rest and in sitting EOB) and we may need to check the weight capacity before using it, but I would like to try early next week).  PT will continue to follow acutely with the goal to get her in standing as soon as we can.   Follow Up Recommendations  CIR     Equipment Recommendations  Wheelchair (measurements PT);Wheelchair cushion (measurements PT);Hospital bed;Other (comment);3in1 (PT) (hoyer lift)    Recommendations for Other Services Rehab consult     Precautions / Restrictions Precautions Precautions: Fall Precaution Comments: multiple  abdominal wounds, trach, feeding tube, chest tube Required Braces or Orthoses: Other Brace/Splint Other Brace/Splint: L wrist in splint, abdominal binder Restrictions LUE Weight Bearing: Weight bearing as tolerated (in left wrist splint) RLE Weight Bearing: Weight bearing as tolerated (for transfers, no gait) LLE Weight Bearing: Weight bearing as tolerated (for transfers, no gait)    Mobility  Bed Mobility Overal bed mobility: Needs Assistance;+2 for physical assistance Bed Mobility: Rolling;Supine to Sit;Sit to Supine Rolling: +2 for physical assistance;Max assist   Supine to sit: +2 for physical assistance;Max assist Sit to supine: +2 for physical assistance;Max assist   General bed mobility comments: Two person max assist to get to EOB. Pt assisting with getting LEs progressed to EOB, and trying to use her arms to help pull to sitting.  Assist needed of both legs and to help control trunk to get back into the bed.    Transfers Overall transfer level: Needs assistance               General transfer comment: Donned maxi sky rolling in bed.         Balance Overall balance assessment: Needs assistance Sitting-balance support: Feet supported;Bilateral upper extremity supported;No upper extremity supported Sitting balance-[name]: Poor Sitting balance - Comments: poor with periods of fair as pt is able to take more time unsupported EOB today than in previous sessions.  She does fatigue and need some periods of supported rest, but did very well trying to pull herself forward in sitting today.  We worked on LE exercises and tolerance of PO ice  chips with SLP EOB.   PMV was used almost the entire session.   Postural control: Posterior lean (when fatigued)                          Cognition Arousal/Alertness: Awake/alert Behavior During Therapy: Flat affect Overall Cognitive Status: Impaired/Different from baseline Area of Impairment:  Orientation;Attention;Following commands Orientation Level: Time;Disoriented to Current Attention Level: Sustained   Following Commands: Follows one step commands consistently;Follows multi-step commands with increased time       General Comments: Pt reports fatigue today, still hallucinating people in the room, but aware that they are not real.      Exercises General Exercises - Lower Extremity Long Arc Quad: AROM;Both;10 reps;Seated Hip Flexion/Marching: AAROM;Both;10 reps;Seated Toe Raises: AROM;Both;Seated;20 reps Heel Raises: AROM;Both;20 reps;Seated Other Exercises Other Exercises: Pt seems weaker on left leg than right leg with seated exercises.         Pertinent Vitals/Pain Pain Assessment: Faces Faces Pain Scale: Hurts even more Pain Location: abdomen Pain Descriptors / Indicators: Grimacing;Guarding Pain Intervention(s): Limited activity within patient's tolerance;Monitored during session;Repositioned           PT Goals (current goals can now be found in the care plan section) Acute Rehab PT Goals Patient Stated Goal: to talk with her sister in private today (or at least as private as the [employer] sitter could allow).  Progress towards PT goals: Progressing toward goals    Frequency  Min 5X/week    PT Plan Current plan remains appropriate    Co-evaluation PT/OT/SLP Co-Evaluation/Treatment: Yes Reason for Co-Treatment: Complexity of the patient's impairments (multi-system involvement);Necessary to address cognition/behavior during functional activity;For patient/therapist safety     SLP goals addressed during session: Swallowing   End of Session Equipment Utilized During Treatment: Oxygen (trach collar) Activity Tolerance: Patient limited by fatigue Patient left: in chair;with call bell/phone within reach;with nursing/sitter in room;with family/visitor present;with restraints reapplied (bil wrist restraints and mittens)     Time: [id number] PT Time  Calculation (min) (ACUTE ONLY): 51 min  Charges:  $Therapeutic Activity: 23-37 mins                     Yaretsi Humphres B. Midori Dado, PT, DPT [phone]   04/02/2016, 1:40 PM

## 2016-04-02 NOTE — Evaluation (Signed)
Clinical/Bedside Swallow Evaluation Patient Details  Name: [name] Bell MRN: [id number] Date of Birth: 10-Feb-1981  Today's Date: 04/02/2016 Time: SLP Start Time (ACUTE ONLY): 1100 SLP Stop Time (ACUTE ONLY): 1144 SLP Time Calculation (min) (ACUTE ONLY): 44 min  Past Medical History: History reviewed. No pertinent past medical history. Past Surgical History:  Past Surgical History  Procedure Laterality Date  . Esophagogastroduodenoscopy N/A 02/10/2016    Procedure: ESOPHAGOGASTRODUODENOSCOPY (EGD);  Surgeon: Sherrilyn RistHenry L Danis III, MD;  [facility];  Service: Endoscopy;  Laterality: N/A;  . Laparotomy N/A 02/10/2016    Procedure: EXPLORATORY LAPAROTOMY, removal of packs,  cauterization of liver, repacking of liver, and open abdomen vac application;  Surgeon: [name] Thompson, MD;  Location: [facility] OR;  Service: General;  Laterality: N/A;  . Laparotomy N/A 02/11/2016    Procedure: EXPLORATORY LAPAROTOMY VAC CHANGE ;  Surgeon: Jimmye NormanJames [name], MD;  [facility];  Service: General;  Laterality: N/A;  . Chest tube insertion Right 02/11/2016    Procedure: CHEST TUBE INSERTION;  Surgeon: Jimmye NormanJames [name], MD;  [facility];  Service: General;  Laterality: Right;  . Laparotomy N/A 02/13/2016    Procedure: EXPLORATORY LAPAROTOMY, REMOVAL OF PACKS, ABDOMINAL VAC DRESSING CHANGE;  Surgeon: [name] Thompson, MD;  Location: [facility] OR;  Service: General;  Laterality: N/A;  . Laparotomy N/A 02/18/2016    Procedure: EXPLORATORY LAPAROTOMY, PLACEMENT OF ABRA ABDOMINAL WALL CLOSURE SET;  Surgeon: Jimmye NormanJames [name], MD;  Location: [facility] OR;  Service: General;  Laterality: N/A;  . Chest tube insertion Right 02/26/2016    Procedure: CHEST TUBE INSERTION;  Surgeon: Kerin PernaPeter Van Trigt, MD;  [facility];  Service: Thoracic;  Laterality: Right;  . Wound debridement N/A 03/01/2016    Procedure: ABDOMINAL WOUND CLOSURE;  Surgeon: Jimmye NormanJames [name], MD;  [facility];  Service: General;  Laterality: N/A;  . Percutaneous tracheostomy N/A 03/01/2016     Procedure: PERCUTANEOUS TRACHEOSTOMY;  Surgeon: Jimmye NormanJames [name], MD;  [facility];  Service: General;  Laterality: N/A;  . Laparotomy N/A 02/09/2016    Procedure: EXPLORATORY LAPAROTOMY;  Surgeon: Jimmye NormanJames [name], MD;  [facility];  Service: General;  Laterality: N/A;  . Cholecystectomy  02/09/2016    Procedure: CHOLECYSTECTOMY;  Surgeon: Jimmye NormanJames [name], MD;  [facility];  Service: General;;  . Bowel resection  02/09/2016    Procedure: SMALL BOWEL RESECTION, MESENTERIC REPAIR;  Surgeon: Jimmye NormanJames [name], MD;  [facility];  Service: General;;  . Application of wound vac  02/09/2016    Procedure: APPLICATION OF WOUND VAC;  Surgeon: Jimmye NormanJames [name], MD;  [facility];  Service: General;;  . Laceration repair  02/09/2016    Procedure: REPAIR LIVER LACERATION;  Surgeon: Jimmye NormanJames [name], MD;  [facility];  Service: General;;  . External fixation pelvis  02/09/2016    Procedure: EXTERNAL FIXATION PELVIS;  Surgeon: Myrene GalasMichael [name], MD;  [facility];  Service: Orthopedics;;  . Laparotomy N/A 02/16/2016    Procedure: EXPLORATORY LAPAROTOMY, ABDOMINAL WASH OUT;  Surgeon: [name] Thompson, MD;  Location: [facility] OR;  Service: General;  Laterality: N/A;  . Application of wound vac N/A 02/16/2016    Procedure: RE-APPLICATION OF WOUND VAC;  Surgeon: [name] Thompson, MD;  Location: [facility] OR;  Service: General;  Laterality: N/A;  . Sacro-iliac pinning Right 02/16/2016    Procedure: Loyal GamblerSACRO-ILIAC PINNING;  Surgeon: Myrene GalasMichael [name], MD;  [facility];  Service: Orthopedics;  Laterality: Right;   HPI:  [name] Bell is a 35 y.o. female.PMH unknown.Pt attempted suicide: taped DNR  note to chest, drank Drano, then jumped off 5 story building ([address] [place]).S/p 02/09/16 gel foam embolization of bil internal iliac arteries.S/p 02/09/16 ex lap with cholecystectomy, resection SB and mesenteric repair, liver lac repair, application or wound vac.S/p 02/09/16 closed reduction of pelvic ring, external pelvic fixation.Also has right metacarpal  fracture in splint. Has #6 cuffed trach and has tolerated trach collar for 24 hours.   Assessment / Plan / Recommendation Clinical Impression  Pt consumed self-fed ice chip trials while sitting EOB with PT. Oral phase was appropriate, but pharyngeal deficits likely given multiple swallows and frequent throat clearing observed. Dysphagia is likely given prolonged intubation, generalized deconditioning, and poor vocal quality. At this time her cough is weak and likely would not be effective at protecting her airway from possible penetrates/aspirates. Recommend to remain NPO with additional PO trials with SLP only. Pt will likely benefit from a FEES prior to initiating diet.     Aspiration Risk  Severe aspiration risk    Diet Recommendation NPO   Medication Administration: Via alternative means    Other  Recommendations Oral Care Recommendations: Oral care QID   Follow up Recommendations  Inpatient Rehab    Frequency and Duration min 2x/week  2 weeks       Prognosis Prognosis for Safe Diet Advancement: Good      Swallow Study   General HPI: [name] Bell is a 35 y.o. female.PMH unknown.Pt attempted suicide: taped DNR note to chest, drank Drano, then jumped off 5 story building ([address] [place]).S/p 02/09/16 gel foam embolization of bil internal iliac arteries.S/p 02/09/16 ex lap with cholecystectomy, resection SB and mesenteric repair, liver lac repair, application or wound vac.S/p 02/09/16 closed reduction of pelvic ring, external pelvic fixation.Also has right metacarpal fracture in splint. Has #6 cuffed trach and has tolerated trach collar for 24 hours. Type of Study: Bedside Swallow Evaluation Previous Swallow Assessment: none in chart Diet Prior to this Study: NPO;PEG tube Temperature Spikes Noted: No Respiratory Status: Trach;Trach Collar Trach Size and Type: Cuff;#6;Deflated;With PMSV in place History of Recent Intubation: Yes Length of Intubations (days): 21 days Date  extubated:  (trach 4/24) Behavior/Cognition: Alert;Cooperative;Requires cueing Oral Cavity Assessment: Within Functional Limits Oral Care Completed by SLP: No Oral Cavity - Dentition: Adequate natural dentition Vision: Functional for self-feeding Self-Feeding Abilities: Able to feed self Patient Positioning: Upright in bed Baseline Vocal Quality: Breathy;Low vocal intensity Volitional Cough: Weak    Oral/Motor/Sensory Function Overall Oral Motor/Sensory Function: Within functional limits   Ice Chips Ice chips: Impaired Presentation: Spoon Pharyngeal Phase Impairments: Throat Clearing - Immediate;Throat Clearing - Delayed;Wet Vocal Quality;Multiple swallows   Thin Liquid Thin Liquid: Not tested    Nectar Thick Nectar Thick Liquid: Not tested   Honey Thick Honey Thick Liquid: Not tested   Puree Puree: Not tested   Solid   GO   Solid: Not tested        Maxcine Ham, M.A. CCC-SLP [phone]  Maxcine Ham 04/02/2016,12:49 PM

## 2016-04-02 NOTE — Consult Note (Signed)
[facility] Face-to-Face Psychiatry Consult follow-up   Reason for Consult:  Suicide attempt Referring Physician:  Trauma MD Patient Identification: [name] Bell MRN:  [id number] Principal Diagnosis: Suicide attempt [facility]) Diagnosis:   Patient Active Problem List   Diagnosis Date Noted  . Multiple pelvic fractures ([place]) [S32.810A] 03/03/2016  . Left wrist fracture [S62.102A] 03/03/2016  . Multiple fractures of ribs of right side [S22.41XA] 03/03/2016  . Fracture of multiple ribs of left side [S22.42XA] 03/03/2016  . Liver laceration [S36.113A] 03/03/2016  . Small intestine injury [S36.409A] 03/03/2016  . Acute blood loss anemia [D62] 03/03/2016  . Suicide attempt ([place]) [T14.91] 03/03/2016  . Bilateral pulmonary contusion [S27.322A] 03/03/2016  . Acute respiratory failure ([place]) [J96.00] 03/03/2016  . Hypokalemia [E87.6] 03/03/2016  . Hypernatremia [E87.0] 03/03/2016  . Acute kidney injury ([place]) [N17.9] 03/03/2016  . Ingestion of caustic substance [T54.91XA] 03/03/2016  . Tylenol overdose [T39.1X4A] 03/03/2016  . Hyperglycemia [R73.9] 03/03/2016  . Hepatic failure ([place]) [K72.90] 03/03/2016  . Pressure ulcer [L89.90] 02/26/2016  . Fall from, out of or through building, not otherwise specified, initial encounter [W13.9XXA] 02/09/2016  . Hypovolemic shock ([place]) [R57.1] 02/09/2016    Total Time spent with patient: 30 minutes  Subjective:   [name] Bell is a 35 y.o. female patient admitted with status post suicidal attempt.  HPI:  [name] Bell is a 35 years old female admitted to [facility] Since 02/09/2016,status post suicidal attempt, jumped from her 5th floor hotel room. Patient required multiple surgical interventions regarding pelvic fractures, left wrist fracture multiple fractures of relapse of right side and left side and also has a liver laceration and small intestine injury And hypovolemic's shock Patient required intubation in and out several times due to difficulty to  weaning off at this time. Reportedly patient sister has been supportive and in and out into the hospital Patient sister believes she continued to feel suicidal ideation. Patient is under sedation secondary to medication during my evaluation and intermittently able to stay awake briefly and nodding her head for the questions Patient endorses being depressed but denies being currently suicidal. Staff RN reported patient had nodding his seems to be more appropriate to the questions asking her. We will try to reach patient's sister to get more psychosocial stressors and also reevaluate the patient when she becomes more  communicative without sedation. Past Psychiatric History: Review of medical records indicated patient has no previous psychiatric admission health Center.  04/02/2016  Interval history: Patient seen today for psychiatric consultation follow-up and also case discussed with staff RN. Patient complaining about stomach discomfort and staff nurse reported she is getting medication stool softener/laxative's as she is not able to pass bowel movements 2 days. Patient has a PEG tube, tracheostomy tube, soft restraints to the upper extremities and glucose to prevent further pulling away the tracheostomy tube. Patient continue to meet criteria for acute psychiatric hospitalization when medically stable. Patient continued to deny suicidal ideation but her behavior is more dangerous than her verbal report.  Patient could not talk verbally but awake, alert oriented and communicated by nodding her head. Patient will be considered acutely suicidal and needed one-to-one observation at this time. Patient is currently under involuntary commitment and [facility]    Risk to Self: Is patient at risk for suicide?: Yes Risk to Others:   Prior Inpatient Therapy:   Prior Outpatient Therapy:    Past Medical History: History reviewed. No pertinent past medical history.  Past  Surgical  History  Procedure Laterality Date  . Esophagogastroduodenoscopy N/A 02/10/2016    Procedure: ESOPHAGOGASTRODUODENOSCOPY (EGD);  Surgeon: Doran Stabler, MD;  Location: [facility] ENDOSCOPY;  Service: Endoscopy;  Laterality: N/A;  . Laparotomy N/A 02/10/2016    Procedure: EXPLORATORY LAPAROTOMY, removal of packs,  cauterization of liver, repacking of liver, and open abdomen vac application;  Surgeon: Georganna Skeans, MD;  Location: [place];  Service: General;  Laterality: N/A;  . Laparotomy N/A 02/11/2016    Procedure: EXPLORATORY LAPAROTOMY VAC CHANGE ;  Surgeon: Judeth Horn, MD;  Location: [place];  Service: General;  Laterality: N/A;  . Chest tube insertion Right 02/11/2016    Procedure: CHEST TUBE INSERTION;  Surgeon: Judeth Horn, MD;  Location: [place];  Service: General;  Laterality: Right;  . Laparotomy N/A 02/13/2016    Procedure: EXPLORATORY LAPAROTOMY, REMOVAL OF PACKS, ABDOMINAL VAC DRESSING CHANGE;  Surgeon: Georganna Skeans, MD;  Location: [place];  Service: General;  Laterality: N/A;  . Laparotomy N/A 02/18/2016    Procedure: EXPLORATORY LAPAROTOMY, PLACEMENT OF ABRA ABDOMINAL WALL CLOSURE SET;  Surgeon: Judeth Horn, MD;  Location: [place];  Service: General;  Laterality: N/A;  . Chest tube insertion Right 02/26/2016    Procedure: CHEST TUBE INSERTION;  Surgeon: Ivin Poot, MD;  Location: [place];  Service: Thoracic;  Laterality: Right;  . Wound debridement N/A 03/01/2016    Procedure: ABDOMINAL WOUND CLOSURE;  Surgeon: Judeth Horn, MD;  Location: [place];  Service: General;  Laterality: N/A;  . Percutaneous tracheostomy N/A 03/01/2016    Procedure: PERCUTANEOUS TRACHEOSTOMY;  Surgeon: Judeth Horn, MD;  Location: [place];  Service: General;  Laterality: N/A;  . Laparotomy N/A 02/09/2016    Procedure: EXPLORATORY LAPAROTOMY;  Surgeon: Judeth Horn, MD;  Location: [place];  Service: General;  Laterality: N/A;  . Cholecystectomy  02/09/2016    Procedure: CHOLECYSTECTOMY;  Surgeon: Judeth Horn, MD;  Location: [place];  Service: General;;  . Bowel resection  02/09/2016    Procedure: SMALL BOWEL RESECTION, MESENTERIC REPAIR;  Surgeon: Judeth Horn, MD;  Location: [place];  Service: General;;  . Application of wound vac  02/09/2016    Procedure: APPLICATION OF WOUND VAC;  Surgeon: Judeth Horn, MD;  Location: [place];  Service: General;;  . Laceration repair  02/09/2016    Procedure: REPAIR LIVER LACERATION;  Surgeon: Judeth Horn, MD;  Location: [place];  Service: General;;  . External fixation pelvis  02/09/2016    Procedure: EXTERNAL FIXATION PELVIS;  Surgeon: Altamese Tahoma, MD;  Location: [place];  Service: Orthopedics;;  . Laparotomy N/A 02/16/2016    Procedure: EXPLORATORY LAPAROTOMY, ABDOMINAL [place];  Surgeon: Georganna Skeans, MD;  Location: [place];  Service: General;  Laterality: N/A;  . Application of wound vac N/A 02/16/2016    Procedure: RE-APPLICATION OF WOUND VAC;  Surgeon: Georganna Skeans, MD;  Location: [place];  Service: General;  Laterality: N/A;  . Sacro-iliac pinning Right 02/16/2016    Procedure: [place];  Surgeon: Altamese Paoli, MD;  Location: [place];  Service: Orthopedics;  Laterality: Right;   Family History: History reviewed. No pertinent family history. Family Psychiatric  History: Unknown Social History:  History  Alcohol Use: Not on file    Comment: unknown     History  Drug Use Not on file    Social History   Social History  . Marital Status: Unknown    Spouse Name: N/A  . Number of Children: N/A  . Years of Education: N/A  Social History Main Topics  . Smoking status: Unknown If Ever Smoked  . Smokeless tobacco: None  . Alcohol Use: None     Comment: unknown  . Drug Use: None  . Sexual Activity: Not Asked   Other Topics Concern  . None   Social History Narrative   Additional Social History:    Allergies:  No Known Allergies  Labs:  Results for orders placed or performed during the hospital encounter of 02/09/16 (from the past 48 hour(s))  Glucose, capillary      Status: Abnormal   Collection Time: 03/31/16  4:25 PM  Result Value Ref Range   Glucose-Capillary 109 (H) 65 - 99 mg/dL   Comment 1 Notify RN   Glucose, capillary     Status: Abnormal   Collection Time: 03/31/16  8:52 PM  Result Value Ref Range   Glucose-Capillary 124 (H) 65 - 99 mg/dL  Glucose, capillary     Status: Abnormal   Collection Time: 03/31/16 11:30 PM  Result Value Ref Range   Glucose-Capillary 134 (H) 65 - 99 mg/dL  Glucose, capillary     Status: Abnormal   Collection Time: 04/01/16  3:51 AM  Result Value Ref Range   Glucose-Capillary 129 (H) 65 - 99 mg/dL  Glucose, capillary     Status: Abnormal   Collection Time: 04/01/16  7:51 AM  Result Value Ref Range   Glucose-Capillary 110 (H) 65 - 99 mg/dL   Comment 1 Document in Chart   Glucose, capillary     Status: Abnormal   Collection Time: 04/01/16 11:18 AM  Result Value Ref Range   Glucose-Capillary 135 (H) 65 - 99 mg/dL   Comment 1 Document in Chart   Glucose, capillary     Status: Abnormal   Collection Time: 04/01/16  4:13 PM  Result Value Ref Range   Glucose-Capillary 104 (H) 65 - 99 mg/dL    Current Facility-Administered Medications  Medication Dose Route Frequency Provider Last Rate Last Dose  . 0.45 % sodium chloride infusion   Intravenous Continuous Judeth Horn, MD 50 mL/hr at 04/02/16 [phone]    . acetaminophen (TYLENOL) solution 650 mg  650 mg Oral Q8H PRN Judeth Horn, MD   650 mg at 03/25/16 1953  . bisacodyl (DULCOLAX) suppository 10 mg  10 mg Rectal Daily PRN Judeth Horn, MD      . chlorhexidine gluconate (SAGE KIT) (PERIDEX) 0.12 % solution 15 mL  15 mL Mouth Rinse BID Ralene Ok, MD   15 mL at 04/02/16 0800  . clonazePAM (KLONOPIN) tablet 0.5 mg  0.5 mg Per Tube TID Ambrose Finland, MD   0.5 mg at 04/02/16 1012  . diphenhydrAMINE (BENADRYL) 12.5 MG/5ML elixir 25 mg  25 mg Per Tube Q6H PRN Georganna Skeans, MD   25 mg at 03/30/16 2254  . escitalopram (LEXAPRO) tablet 10 mg  10 mg Per Tube  Daily Ambrose Finland, MD   10 mg at 04/02/16 1012  . feeding supplement (PIVOT 1.5 CAL) liquid 1,000 mL  1,000 mL Per Tube Continuous Georganna Skeans, MD 60 mL/hr at 04/01/16 0504 1,000 mL at 04/01/16 0504  . fentaNYL (DURAGESIC - dosed mcg/hr) 50 mcg  50 mcg Transdermal Q72H Lisette Abu, PA-C   50 mcg at 04/01/16 4665  . [START ON 04/04/2016] fentaNYL (DURAGESIC - dosed mcg/hr) patch 25 mcg  25 mcg Transdermal Q72H Judeth Horn, MD      . ferrous sulfate 300 (60 Fe) MG/5ML syrup 300 mg  300 mg Per Tube  BID WC Skeet Simmer, RPH   300 mg at 04/02/16 1224  . fluconazole (DIFLUCAN) 40 MG/ML suspension 400 mg  400 mg Per Tube Once Judeth Horn, MD      . free water 200 mL  200 mL Per Tube Q8H Georganna Skeans, MD   200 mL at 04/02/16 0600  . furosemide (LASIX) 10 MG/ML solution 40 mg  40 mg Oral Daily Lisette Abu, PA-C   40 mg at 04/02/16 1012  . guaiFENesin (ROBITUSSIN) 100 MG/5ML solution 300 mg  15 mL Per Tube Q6H Georganna Skeans, MD   300 mg at 04/02/16 0551  . HYDROmorphone (DILAUDID) injection 0.5 mg  0.5 mg Intravenous Q4H PRN Lisette Abu, PA-C   0.5 mg at 04/01/16 2306  . ipratropium-albuterol (DUONEB) 0.5-2.5 (3) MG/3ML nebulizer solution 3 mL  3 mL Nebulization Q6H PRN Judeth Horn, MD   3 mL at 03/19/16 1943  . LORazepam (ATIVAN) injection 1-2 mg  1-2 mg Intravenous Q4H PRN Georganna Skeans, MD   2 mg at 04/01/16 2305  . ondansetron (ZOFRAN) tablet 4 mg  4 mg Oral Q6H PRN Lisette Abu, PA-C       Or  . ondansetron [facility]) injection 4 mg  4 mg Intravenous Q6H PRN Lisette Abu, PA-C   4 mg at 03/30/16 2138  . oxyCODONE (ROXICODONE) 5 MG/5ML solution 10-20 mg  10-20 mg Per Tube Q4H PRN Lisette Abu, PA-C   20 mg at 04/02/16 8250  . pantoprazole sodium (PROTONIX) 40 mg/20 mL oral suspension 40 mg  40 mg Per Tube Daily Lisette Abu, PA-C   40 mg at 04/01/16 0370  . potassium chloride 20 MEQ/15ML (10%) solution 40 mEq  40 mEq Oral BID Georganna Skeans, MD   40  mEq at 04/02/16 1012  . QUEtiapine (SEROQUEL) tablet 25 mg  25 mg Per Tube BID Lisette Abu, PA-C   25 mg at 04/02/16 1000  . sertraline (ZOLOFT) tablet 100 mg  100 mg Oral Daily Ambrose Finland, MD   100 mg at 04/02/16 1012  . sodium chloride flush (NS) 0.9 % injection 10-40 mL  10-40 mL Intracatheter Q12H Georganna Skeans, MD   10 mL at 04/02/16 1000  . sodium chloride flush (NS) 0.9 % injection 10-40 mL  10-40 mL Intracatheter PRN Georganna Skeans, MD   10 mL at 03/19/16 0404  . sulfamethoxazole-trimethoprim (BACTRIM,SEPTRA) 200-40 MG/5ML suspension 20 mL  20 mL Per Tube Q6H Lisette Abu, PA-C   20 mL at 04/02/16 1011    Musculoskeletal: Strength & Muscle Tone: decreased Gait & Station: unable to stand Patient leans: N/A  Psychiatric Specialty Exam: Review of Systems  Unable to perform ROS   Blood pressure 137/79, pulse 91, temperature 98.6 F (37 C), temperature source Axillary, resp. rate 38, height 5' 11"  (1.803 m), weight 124.9 kg (275 lb 5.7 oz), SpO2 97 %.Body mass index is 38.42 kg/(m^2).  General Appearance: Guarded  Eye Contact::  Minimal  Speech:  NA and communicate briefly nodding her head.  Volume:  not applicable  Mood:  Depressed  Affect:  Depressed and Flat  Thought Process:  Coherent  Orientation:  Negative  Thought Content:  Rumination  Suicidal Thoughts:  Yes.  with intent/plan  Homicidal Thoughts:  No  Memory:  Immediate;   Fair Recent;   Poor  Judgement:  Impaired  Insight:  NA  Psychomotor Activity:  Psychomotor Retardation and Restlessness  Concentration:  Fair  Recall:  Poor  Fund of Knowledge:Fair  Language: Fair  Akathisia:  Negative  Handed:  Right  AIMS (if indicated):     Assets:  Others:  deferred  ADL's:  Impaired  Cognition: Impaired,  Mild  Sleep:      Treatment Plan Summary: Continue Zoloft 100 mg daily which can be increased to up to 200 mg as clinically required for depression and posttraumatic stress  disorder Continue Seroquel 25 mg 3 times daily for agitation and aggressive behaviors Continue safety sitter as patient exhibited dangerous and disruptive behaviors like pulling away her tracheostomy tube [facility] as per medical director and LCSW    Disposition: [facility] medical director and LCSW secondary to deterioration of her behaviors and self-injurious behaviors by pulling away her tracheostomy tube Supportive therapy provided about ongoing stressors.  Ambrose Finland, MD 04/02/2016 1:12 PM

## 2016-04-02 NOTE — Clinical Social Work Note (Signed)
[facility] declined [facility]'s admission.  Charlynn CourtSarah Jasin Brazel, CSW [phone]

## 2016-04-02 NOTE — Progress Notes (Signed)
Trauma Service Note  Subjective: Patient is more alert today.  Interactive.  No acute distress.  Does not want to try to eat  Objective: Vital signs in last 24 hours: Temp:  [98.2 F (36.8 C)-99.2 F (37.3 C)] 98.5 F (36.9 C) (05/26 0735) Pulse Rate:  [80-100] 94 (05/26 0735) Resp:  [15-41] 33 (05/26 0735) BP: (127-145)/(74-77) 138/76 mmHg (05/26 0735) SpO2:  [92 %-100 %] 99 % (05/26 0735) FiO2 (%):  [35 %-40 %] 40 % (05/26 0826) Weight:  [124.9 kg (275 lb 5.7 oz)] 124.9 kg (275 lb 5.7 oz) (05/26 0500) Last BM Date: 03/29/16  Intake/Output from previous day: 05/25 0701 - 05/26 0700 In: 2656.8 [I.V.:1185.8; NG/GT:1471] Out: 5885 [Urine:5885] Intake/Output this shift:    General: No acute distress.  Says her abdomen feels tight.  Has not had a bowel movement in 2-3 days.  Lungs: Clear.  Coughs well.  Clears secretions.  Abd: Soft, good bowel sounds.  Extremities: Swollen and tender bilaterally with some passive dorsiflexion tenderness on the right.  Neuro: The same  Lab Results: CBC  No results for input(s): WBC, HGB, HCT, PLT in the last 72 hours. BMET No results for input(s): NA, K, CL, CO2, GLUCOSE, BUN, CREATININE, CALCIUM in the last 72 hours. PT/INR No results for input(s): LABPROT, INR in the last 72 hours. ABG No results for input(s): PHART, HCO3 in the last 72 hours.  Invalid input(s): PCO2, PO2  Studies/Results: No results found.  Anti-infectives: Anti-infectives    Start     Dose/Rate Route Frequency Ordered Stop   03/31/16 1000  sulfamethoxazole-trimethoprim (BACTRIM,SEPTRA) 200-40 MG/5ML suspension 40 mL  Status:  Discontinued     40 mL Per Tube Every 12 hours 03/31/16 0927 03/31/16 0956   03/31/16 1000  sulfamethoxazole-trimethoprim (BACTRIM,SEPTRA) 200-40 MG/5ML suspension 20 mL     20 mL Per Tube Every 6 hours 03/31/16 0956 04/03/16 2359   03/26/16 1030  cefTRIAXone (ROCEPHIN) 2 g in dextrose 5 % 50 mL IVPB  Status:  Discontinued     2 g 100  mL/hr over 30 Minutes Intravenous Every 24 hours 03/26/16 1023 03/31/16 0921   03/25/16 1000  ceFEPIme (MAXIPIME) 2 g in dextrose 5 % 50 mL IVPB  Status:  Discontinued     2 g 100 mL/hr over 30 Minutes Intravenous Every 12 hours 03/25/16 0829 03/26/16 1023   03/08/16 1400  levofloxacin (LEVAQUIN) IVPB 750 mg     750 mg 100 mL/hr over 90 Minutes Intravenous Every 24 hours 03/08/16 0912 03/10/16 1501   03/06/16 1400  ceFEPIme (MAXIPIME) 2 g in dextrose 5 % 50 mL IVPB  Status:  Discontinued     2 g 100 mL/hr over 30 Minutes Intravenous Every 12 hours 03/06/16 1344 03/08/16 0912   03/01/16 1330  ceFAZolin (ANCEF) IVPB 2g/100 mL premix     2 g 200 mL/hr over 30 Minutes Intravenous To [facility] 02/29/16 0917 03/01/16 1452   02/11/16 0600  piperacillin-tazobactam (ZOSYN) IVPB 3.375 g  Status:  Discontinued     3.375 g 100 mL/hr over 30 Minutes Intravenous 4 times per day 02/10/16 2214 02/22/16 0853   02/10/16 1730  piperacillin-tazobactam (ZOSYN) IVPB 3.375 g  Status:  Discontinued     3.375 g 12.5 mL/hr over 240 Minutes Intravenous 3 times per day 02/10/16 1727 02/10/16 2214      Assessment/Plan: s/p Procedure(s): ABDOMINAL WOUND CLOSURE PERCUTANEOUS TRACHEOSTOMY Swallowing evaluation.  LOS: 53 days   [name] Bell, III, MD, FACS [phone] Trauma Surgeon  04/02/2016   

## 2016-04-02 NOTE — Progress Notes (Signed)
Speech Language Pathology Treatment: [name] Speaking valve  Patient Details Name: [name] Bell MRN: [id number] DOB: May 25, 1981 Today's Date: 04/02/2016 Time: [id number] SLP Time Calculation (min) (ACUTE ONLY): 44 min  Assessment / Plan / Recommendation Clinical Impression  Upon initial arrival, pt's RR was in the low to mid-40s with cuff deflated. Min cues provided for slower, deeper inhalations with drop in RR to 32. PMSV was placed with HR and SpO2 stable, but intermittent jumps in RR that appeared to be more related to exertion and pain as opposed to valve intolerance. Pt was seen during co-tx with PT to facilitate upright positioning at EOB for improved breath support. She still primarily communicates at a whisper level, but soft, breathy phonation was heard when she was able to bear down with pillow or while sitting upright. Although she still has some confusion and hallucinations still, she is able to functionally participate in all tasks today. Would continue use of PMV with full staff supervision given fluctuating mentation and RR.   HPI HPI: [name] Bell is a 35 y.o. female.PMH unknown.Pt attempted suicide: taped DNR note to chest, drank Drano, then jumped off 5 story building ([address]).S/p 02/09/16 gel foam embolization of bil internal iliac arteries.S/p 02/09/16 ex lap with cholecystectomy, resection SB and mesenteric repair, liver lac repair, application or wound vac.S/p 02/09/16 closed reduction of pelvic ring, external pelvic fixation.Also has right metacarpal fracture in splint. Has #6 cuffed trach and has tolerated trach collar for 24 hours.      SLP Plan  Continue with current plan of care     Recommendations         Patient may use [name] Speech Valve: Intermittently with supervision PMSV Supervision: Full MD: Please consider changing trach tube to : Cuffless      Oral Care Recommendations: Oral care QID Follow up Recommendations: Inpatient Rehab Plan:  Continue with current plan of care     GO               Maxcine HamLaura Paiewonsky, M.A. CCC-SLP [phone]  [name], Erasto Sleight 04/02/2016, 12:23 PM

## 2016-04-02 NOTE — Progress Notes (Signed)
Referring Physician(s): Dr. Kathlee NationsPeter Van Trigt  Supervising Physician: Oley BalmHassell, Daniel  Patient Status: In-pt  Chief Complaint:  F/U after right chest tube placment by Dr. Grace IsaacWatts 03/27/2016  Subjective:  [name] is sitting up in a chair today.   On Trach collar  Family member at bedside.  Allergies: Review of patient's allergies indicates no known allergies.  Medications: Prior to Admission medications   Not on File     Vital Signs: BP 137/79 mmHg  Pulse 91  Temp(Src) 98.6 F (37 C) (Axillary)  Resp 38  Ht 5\' 11"  (1.803 m)  Wt 275 lb 5.7 oz (124.9 kg)  BMI 38.42 kg/m2  SpO2 97%  LMP  (LMP Unknown)  Physical Exam Awake Up in chair - unable to see chest tube site Chest tube to water seal Output 120 ml bloody output  Imaging: Dg Pelvis 3v Judet  03/31/2016  CLINICAL DATA:  Pelvic ring fracture. EXAM: JUDET PELVIS - 3+ VIEW COMPARISON:  Multiple priors. FINDINGS: Cannulated screws traverse both sacroiliac joints and are stable in position. External fixators have been removed. There appear to be early callus formation across the superior and inferior pubic and ischial fractures. IMPRESSION: Healing pelvic fractures.  External fixators have been removed. Electronically Signed   By: Elsie StainJohn T Curnes M.D.   On: 03/31/2016 10:11   Dg Chest Port 1 View  03/31/2016  CLINICAL DATA:  Chest trauma, tracheostomy patient. EXAM: PORTABLE CHEST 1 VIEW COMPARISON:  Portable chest x-ray of Mar 28, 2016 FINDINGS: On the right the remains enlarged pleural effusion-hemothorax without pneumothorax. A small caliber chest tube is in place in the inferior aspect of the hemithorax. Aeration of the right lung has decreased slightly with mild interval increase in the volume of fluid present. On the left the interstitial densities persist. No pleural effusion or pneumothorax is observed. The cardiac silhouette remains enlarged. The pulmonary vascularity remains indistinct. The right PICC line tip  projects over the distal third of the SVC. The tracheostomy appliance tip lies at the level of the clavicular heads. Multiple right rib fractures are again demonstrated. IMPRESSION: Slight interval deterioration in the appearance of the right lung with decreased aeration. A moderate-sized hemothorax or pleural effusion persist surrounding the the lung. The right pigtail drainage catheter projects at the level of the hemidiaphragm and is stable. Electronically Signed   By: David  SwazilandJordan M.D.   On: 03/31/2016 07:50    Labs:  CBC:  Recent Labs  03/25/16 0415 03/27/16 0600 03/29/16 0445 03/30/16 0605  WBC 16.6* 8.0 7.0 8.2  HGB 7.1* 6.6* 7.7* 8.5*  HCT 23.5* 22.5* 25.6* 28.5*  PLT 116* 153 226 261    COAGS:  Recent Labs  02/09/16 1040 02/09/16 1430  02/13/16 0600 02/13/16 1620 02/23/16 1926 02/24/16 0415 03/27/16 0445  INR 2.11* 1.99*  < > 2.04* 1.75*  --  1.47 1.52*  APTT 80* 63*  --  36  --  37  --   --   < > = values in this interval not displayed.  BMP:  Recent Labs  03/25/16 0415 03/26/16 0346 03/27/16 0445 03/30/16 0605  NA 136 140 139 140  K 3.9 3.6 3.7 3.3*  CL 99* 104 100* 101  CO2 30 29 30  35*  GLUCOSE 172* 131* 104* 126*  BUN 23* 26* 29* 16  CALCIUM 8.2* 8.1* 8.4* 8.2*  CREATININE 0.59 0.52 0.53 0.31*  GFRNONAA >60 >60 >60 >60  GFRAA >60 >60 >60 >60    LIVER FUNCTION  TESTS:  Recent Labs  03/09/16 0530 03/11/16 0415 03/18/16 0423 03/26/16 0346  BILITOT 5.1* 4.8* 3.1* 3.0*  AST 71* 70* 37 37  ALT 41 43 33 28  ALKPHOS 107 115 137* 139*  PROT 5.6* 5.7* 5.8* 5.7*  ALBUMIN 1.2* 1.2* 1.4* 1.3*    Assessment and Plan:  S/P placement of right chest tube by Dr. Grace Isaac on 03/27/2016 for pleural effusion.  Continue current care. Will continue to follow.  Electronically Signed: Gwynneth Macleod PA-C 04/02/2016, 2:08 PM   I spent a total of 15 Minutes at the the patient's bedside AND on the patient's hospital floor or unit, greater than 50% of which  was counseling/coordinating care for chest tube.

## 2016-04-03 ENCOUNTER — Inpatient Hospital Stay (HOSPITAL_COMMUNITY): Payer: Medicaid Other

## 2016-04-03 ENCOUNTER — Encounter (HOSPITAL_COMMUNITY): Payer: Self-pay

## 2016-04-03 DIAGNOSIS — M79606 Pain in leg, unspecified: Secondary | ICD-10-CM

## 2016-04-03 LAB — BASIC METABOLIC PANEL
Anion gap: 5 (ref 5–15)
BUN: 8 mg/dL (ref 6–20)
CALCIUM: 8.3 mg/dL — AB (ref 8.9–10.3)
CO2: 31 mmol/L (ref 22–32)
CREATININE: 0.4 mg/dL — AB (ref 0.44–1.00)
Chloride: 97 mmol/L — ABNORMAL LOW (ref 101–111)
GFR calc non Af Amer: >60 mL/min (ref >60)
Glucose, Bld: 107 mg/dL — ABNORMAL HIGH (ref 65–99)
Potassium: 4 mmol/L (ref 3.5–5.1)
SODIUM: 133 mmol/L — AB (ref 135–145)

## 2016-04-03 MED ORDER — DIATRIZOATE MEGLUMINE & SODIUM 66-10 % PO SOLN
ORAL | Status: AC
  Filled 2016-04-03: qty 60

## 2016-04-03 NOTE — Progress Notes (Signed)
Bilateral soft wrist restraints discontinued in lieu of use of bilateral hand mitts.  Patient agreeable to change as well as sister.  24 hour sitter in attendance.  MD notified.

## 2016-04-03 NOTE — Progress Notes (Signed)
*  PRELIMINARY RESULTS* Vascular Ultrasound Lower extremity venous duplex has been completed.  Preliminary findings: No evidence of DVT or baker's cyst. Unchanged from previous studies.    Farrel DemarkJill Eunice, RDMS, RVT  04/03/2016, 2:35 PM

## 2016-04-03 NOTE — Progress Notes (Signed)
While changing dressing for PEG tube, RN noticed sutures no longer attached and approximately 2 inches out of insertion site. RN changed dressing on 04/02/2016 at 0330 and suture was intact at insertion site. RN concerned of possible PEG dislodgement. Tube feeds stopped. Patient had complaints of abdominal tightness and was given a suppository per day shift RN. No changes in vitals signs. MD notified at [phone]. RN was told they would look at it during AM rounds. RN will continue to monitor patient.

## 2016-04-03 NOTE — Progress Notes (Signed)
Tracheal setup changed. RT also suctioned patient's trach. No complications. Vitals stable throughout. RT will continue to monitor.

## 2016-04-03 NOTE — Progress Notes (Signed)
Patient ID: [name] Bell, female   DOB: 08-02-1981, 35 y.o.   MRN: [id number]    Referring Physician(s): Kathlene CoteVan Trigt,P  Supervising Physician: Malachy MoanMcCullough, Heath  Patient Status: In-pt  Chief Complaint: Right pleural effusion/hemothorax   Subjective:  Pt currently stable; denies worsening dyspnea  Allergies: Review of patient's allergies indicates no known allergies.  Medications: Prior to Admission medications   Not on File     Vital Signs: BP 132/73 mmHg  Pulse 80  Temp(Src) 97.4 F (36.3 C) (Axillary)  Resp 31  Ht 5\' 11"  (1.803 m)  Wt 256 lb 6.3 oz (116.3 kg)  BMI 35.78 kg/m2  SpO2 97%  LMP  (LMP Unknown)  Physical Exam rt chest drain intact, output per nurse about 30 cc brown fluid, however when tube examined stopcock was in clamped mode- now flowing ; cx's- enterobacter  Imaging: Dg Pelvis 3v Judet  03/31/2016  CLINICAL DATA:  Pelvic ring fracture. EXAM: JUDET PELVIS - 3+ VIEW COMPARISON:  Multiple priors. FINDINGS: Cannulated screws traverse both sacroiliac joints and are stable in position. External fixators have been removed. There appear to be early callus formation across the superior and inferior pubic and ischial fractures. IMPRESSION: Healing pelvic fractures.  External fixators have been removed. Electronically Signed   By: Elsie StainJohn T Curnes M.D.   On: 03/31/2016 10:11   Dg Chest Port 1 View  03/31/2016  CLINICAL DATA:  Chest trauma, tracheostomy patient. EXAM: PORTABLE CHEST 1 VIEW COMPARISON:  Portable chest x-ray of Mar 28, 2016 FINDINGS: On the right the remains enlarged pleural effusion-hemothorax without pneumothorax. A small caliber chest tube is in place in the inferior aspect of the hemithorax. Aeration of the right lung has decreased slightly with mild interval increase in the volume of fluid present. On the left the interstitial densities persist. No pleural effusion or pneumothorax is observed. The cardiac silhouette remains enlarged. The pulmonary  vascularity remains indistinct. The right PICC line tip projects over the distal third of the SVC. The tracheostomy appliance tip lies at the level of the clavicular heads. Multiple right rib fractures are again demonstrated. IMPRESSION: Slight interval deterioration in the appearance of the right lung with decreased aeration. A moderate-sized hemothorax or pleural effusion persist surrounding the the lung. The right pigtail drainage catheter projects at the level of the hemidiaphragm and is stable. Electronically Signed   By: David  SwazilandJordan M.D.   On: 03/31/2016 07:50    Labs:  CBC:  Recent Labs  03/25/16 0415 03/27/16 0600 03/29/16 0445 03/30/16 0605  WBC 16.6* 8.0 7.0 8.2  HGB 7.1* 6.6* 7.7* 8.5*  HCT 23.5* 22.5* 25.6* 28.5*  PLT 116* 153 226 261    COAGS:  Recent Labs  02/09/16 1040 02/09/16 1430  02/13/16 0600 02/13/16 1620 02/23/16 1926 02/24/16 0415 03/27/16 0445  INR 2.11* 1.99*  < > 2.04* 1.75*  --  1.47 1.52*  APTT 80* 63*  --  36  --  37  --   --   < > = values in this interval not displayed.  BMP:  Recent Labs  03/26/16 0346 03/27/16 0445 03/30/16 0605 04/03/16 0548  NA 140 139 140 133*  K 3.6 3.7 3.3* 4.0  CL 104 100* 101 97*  CO2 29 30 35* 31  GLUCOSE 131* 104* 126* 107*  BUN 26* 29* 16 8  CALCIUM 8.1* 8.4* 8.2* 8.3*  CREATININE 0.52 0.53 0.31* 0.40*  GFRNONAA >60 >60 >60 >60  GFRAA >60 >60 >60 >60    LIVER FUNCTION  TESTS:  Recent Labs  03/09/16 0530 03/11/16 0415 03/18/16 0423 03/26/16 0346  BILITOT 5.1* 4.8* 3.1* 3.0*  AST 71* 70* 37 37  ALT 41 43 33 28  ALKPHOS 107 115 137* 139*  PROT 5.6* 5.7* 5.8* 5.7*  ALBUMIN 1.2* 1.2* 1.4* 1.3*    Assessment and Plan: S/p rt chest drain for effusion/hemothorax 5/20; AF; fluid cx's - few enterobacter; monitor CXR (LAST FILM 5/24) ; antbx per primary team; cont drain for now - await TCTS input   Electronically Signed: D. Jeananne Rama 04/03/2016, 2:20 PM   I spent a total of 15 minutes at  the the patient's bedside AND on the patient's hospital floor or unit, greater than 50% of which was counseling/coordinating care for rt chest drain

## 2016-04-03 NOTE — Progress Notes (Signed)
33 Days Post-Op  Subjective: Comfortable.    Objective: Vital signs in last 24 hours: Temp:  [97.4 F (36.3 C)-99.5 F (37.5 C)] 97.4 F (36.3 C) (05/27 0751) Pulse Rate:  [77-95] 80 (05/27 0300) Resp:  [29-38] 31 (05/27 0300) BP: (125-141)/(45-84) 132/73 mmHg (05/27 0300) SpO2:  [96 %-99 %] 97 % (05/27 0300) FiO2 (%):  [40 %] 40 % (05/27 0300) Weight:  [116.3 kg (256 lb 6.3 oz)] 116.3 kg (256 lb 6.3 oz) (05/27 0300) Last BM Date: 04/03/16  Intake/Output from previous day: 05/26 0701 - 05/27 0700 In: 2448.2 [I.V.:1234.2; NG/GT:1214] Out: 1478213750 [Urine:13750] Intake/Output this shift: Total I/O In: 100 [I.V.:100] Out: 1100 [Urine:1100]  PE: General- In NAD Neck-trach in Abdomen-soft, dressings dry, g-tube in  Lab Results:  No results for input(s): WBC, HGB, HCT, PLT in the last 72 hours. BMET  Recent Labs  04/03/16 0548  NA 133*  K 4.0  CL 97*  CO2 31  GLUCOSE 107*  BUN 8  CREATININE 0.40*  CALCIUM 8.3*   PT/INR No results for input(s): LABPROT, INR in the last 72 hours. Comprehensive Metabolic Panel:    Component Value Date/Time   NA 133* 04/03/2016 0548   NA 140 03/30/2016 0605   K 4.0 04/03/2016 0548   K 3.3* 03/30/2016 0605   CL 97* 04/03/2016 0548   CL 101 03/30/2016 0605   CO2 31 04/03/2016 0548   CO2 35* 03/30/2016 0605   BUN 8 04/03/2016 0548   BUN 16 03/30/2016 0605   CREATININE 0.40* 04/03/2016 0548   CREATININE 0.31* 03/30/2016 0605   GLUCOSE 107* 04/03/2016 0548   GLUCOSE 126* 03/30/2016 0605   CALCIUM 8.3* 04/03/2016 0548   CALCIUM 8.2* 03/30/2016 0605   AST 37 03/26/2016 0346   AST 37 03/18/2016 0423   ALT 28 03/26/2016 0346   ALT 33 03/18/2016 0423   ALKPHOS 139* 03/26/2016 0346   ALKPHOS 137* 03/18/2016 0423   BILITOT 3.0* 03/26/2016 0346   BILITOT 3.1* 03/18/2016 0423   PROT 5.7* 03/26/2016 0346   PROT 5.8* 03/18/2016 0423   ALBUMIN 1.3* 03/26/2016 0346   ALBUMIN 1.4* 03/18/2016 0423     Studies/Results: No results  found.  Anti-infectives: Anti-infectives    Start     Dose/Rate Route Frequency Ordered Stop   04/02/16 1200  fluconazole (DIFLUCAN) 40 MG/ML suspension 400 mg     400 mg Per Tube  Once 04/02/16 1026 04/02/16 1442   03/31/16 1000  sulfamethoxazole-trimethoprim (BACTRIM,SEPTRA) 200-40 MG/5ML suspension 40 mL  Status:  Discontinued     40 mL Per Tube Every 12 hours 03/31/16 0927 03/31/16 0956   03/31/16 1000  sulfamethoxazole-trimethoprim (BACTRIM,SEPTRA) 200-40 MG/5ML suspension 20 mL     20 mL Per Tube Every 6 hours 03/31/16 0956 04/03/16 2359   03/26/16 1030  cefTRIAXone (ROCEPHIN) 2 g in dextrose 5 % 50 mL IVPB  Status:  Discontinued     2 g 100 mL/hr over 30 Minutes Intravenous Every 24 hours 03/26/16 1023 03/31/16 0921   03/25/16 1000  ceFEPIme (MAXIPIME) 2 g in dextrose 5 % 50 mL IVPB  Status:  Discontinued     2 g 100 mL/hr over 30 Minutes Intravenous Every 12 hours 03/25/16 0829 03/26/16 1023   03/08/16 1400  levofloxacin (LEVAQUIN) IVPB 750 mg     750 mg 100 mL/hr over 90 Minutes Intravenous Every 24 hours 03/08/16 0912 03/10/16 1501   03/06/16 1400  ceFEPIme (MAXIPIME) 2 g in dextrose 5 % 50 mL IVPB  Status:  Discontinued     2 g 100 mL/hr over 30 Minutes Intravenous Every 12 hours 03/06/16 1344 03/08/16 0912   03/01/16 1330  ceFAZolin (ANCEF) IVPB 2g/100 mL premix     2 g 200 mL/hr over 30 Minutes Intravenous To ShortStay Surgical 02/29/16 0917 03/01/16 1452   02/11/16 0600  piperacillin-tazobactam (ZOSYN) IVPB 3.375 g  Status:  Discontinued     3.375 g 100 mL/hr over 30 Minutes Intravenous 4 times per day 02/10/16 2214 02/22/16 0853   02/10/16 1730  piperacillin-tazobactam (ZOSYN) IVPB 3.375 g  Status:  Discontinued     3.375 g 12.5 mL/hr over 240 Minutes Intravenous 3 times per day 02/10/16 1727 02/10/16 2214      Assessment   Jump from hotel/multiple toxic ingestions - on SSRI, sitter for safety. Will check with psych regarding capacity and ability to refuse care at  this point S/P ex lap, hepatorraphy, SBR, cholecystectomy, preperitoneal pelvic packing [name] 4/3  Ex lap for acidosis/bleeding 4/4 Thompson Ex lap for bleeding 4/5 [name] S/P ex fix pelvis [name] 4/3 S/P ex lap 4/7 Thompson S/P ex lap 4/10 Thompson S/P sacral screws 4/10 [name] S/P application ABRA 4/12 [name]  S/P abd closure, trach 4/24 [name] Resp failure - tolerating TC Mult B rib FX/R HPTX, B pulm contusions - IR placed Chest tube drain (03/27/16) for pleural effusion, appreciate TCTS F/U Pneumonia - Enterobacter cloacae - on Septra Day #8/10 Multiple toxic ingestions Mult L wrist FXs - splint until f/u with Dr. Melvyn Novas as OP, can WBAT Pelvic FX - S/P ex fix and angioembolization, S/P sacral screw by Dr. [name]. Bed to chair transfers only for another 4-6 weeks, Pivot with to assist with transfers. ABL anemia - Stable Hyperglycemia - Resolved FEN - high risk for aspiration on swallow eval; continue tube feeds, will come down on Klonopin and Seroquel, wean Duragesic at next patch change VTE - SCD's, Lovenox on hold for chest hemorrhage, surveillance dopplers neg 5/11 Dispo - Keep in SDU   LOS: 54 days   Plan:    Adolph Pollack 04/03/2016

## 2016-04-04 MED ORDER — LIDOCAINE HCL (PF) 2 % IJ SOLN
0.0000 mL | Freq: Once | INTRAMUSCULAR | Status: DC | PRN
  Filled 2016-04-04: qty 20

## 2016-04-04 MED ORDER — CHLORHEXIDINE GLUCONATE 0.12 % MT SOLN
OROMUCOSAL | Status: AC
  Filled 2016-04-04: qty 15

## 2016-04-04 MED ORDER — LIDOCAINE HCL (CARDIAC) 20 MG/ML IV SOLN
INTRAVENOUS | Status: AC
  Filled 2016-04-04: qty 5

## 2016-04-04 MED ORDER — LORAZEPAM 2 MG/ML IJ SOLN
1.0000 mg | Freq: Once | INTRAMUSCULAR | Status: AC
  Administered 2016-04-04: 1 mg via INTRAVENOUS
  Filled 2016-04-04: qty 1

## 2016-04-04 MED ORDER — HALOPERIDOL LACTATE 5 MG/ML IJ SOLN: 5.0000 mg | Freq: Once | INTRAMUSCULAR | Status: DC | PRN

## 2016-04-04 NOTE — Progress Notes (Signed)
34 Days Post-Op  Subjective: g tube confirmed in stomach, TF resumed. O/w no signif issues  Objective: Vital signs in last 24 hours: Temp:  [97.4 F (36.3 C)-99.6 F (37.6 C)] 99.1 F (37.3 C) (05/28 0730) Pulse Rate:  [76-91] 79 (05/28 0357) Resp:  [25-38] 26 (05/28 0357) BP: (126-140)/(72-95) 130/86 mmHg (05/28 0730) SpO2:  [95 %-100 %] 95 % (05/28 0945) FiO2 (%):  [28 %-40 %] 28 % (05/28 0945) Weight:  [112.2 kg (247 lb 5.7 oz)] 112.2 kg (247 lb 5.7 oz) (05/28 0315) Last BM Date: 04/03/16  Intake/Output from previous day: 05/27 0701 - 05/28 0700 In: 2460 [I.V.:1210; NG/GT:1120] Out: 7700 [Urine:7600; Chest Tube:100] Intake/Output this shift: Total I/O In: 220 [I.V.:100; NG/GT:120] Out: 750 [Urine:750]  In lifter On TC CTA b/l CT serous Soft, NG, g tube intact but stitch out No edema  Lab Results:  No results for input(s): WBC, HGB, HCT, PLT in the last 72 hours. BMET  Recent Labs  04/03/16 0548  NA 133*  K 4.0  CL 97*  CO2 31  GLUCOSE 107*  BUN 8  CREATININE 0.40*  CALCIUM 8.3*   PT/INR No results for input(s): LABPROT, INR in the last 72 hours. ABG No results for input(s): PHART, HCO3 in the last 72 hours.  Invalid input(s): PCO2, PO2  Studies/Results: Dg Chest Port 1 View  04/03/2016  CLINICAL DATA:  Right-sided pleural effusion, check chest tube placement EXAM: PORTABLE CHEST 1 VIEW COMPARISON:  03/31/2016 FINDINGS: Tracheostomy tube and right-sided PICC line are again noted in satisfactory position. The cardiac shadow is stable. Prominent pulmonary vasculature is again seen. The right chest tube drain is again seen and stable in positioning. No pneumothorax is seen. Residual lateral pleural thickening is noted. IMPRESSION: No significant change from the prior exam. Electronically Signed   By: Alcide CleverMark  Lukens M.D.   On: 04/03/2016 15:00   Dg Abd Portable 1v  04/03/2016  CLINICAL DATA:  Patient status post peg tube placement. EXAM: PORTABLE ABDOMEN - 1  VIEW COMPARISON:  None. FINDINGS: Oral contrast material is demonstrated within the stomach and proximal duodenum. No evidence for extravasation of contrast. Cholecystectomy clips. Nonobstructed bowel gas pattern. Right lower hemi thorax rib fractures. Postsurgical changes involving the pelvis. IMPRESSION: Injected contrast material appears intraluminal. Electronically Signed   By: Annia Beltrew  Davis M.D.   On: 04/03/2016 16:28    Anti-infectives: Anti-infectives    Start     Dose/Rate Route Frequency Ordered Stop   04/02/16 1200  fluconazole (DIFLUCAN) 40 MG/ML suspension 400 mg     400 mg Per Tube  Once 04/02/16 1026 04/02/16 1442   03/31/16 1000  sulfamethoxazole-trimethoprim (BACTRIM,SEPTRA) 200-40 MG/5ML suspension 40 mL  Status:  Discontinued     40 mL Per Tube Every 12 hours 03/31/16 0927 03/31/16 0956   03/31/16 1000  sulfamethoxazole-trimethoprim (BACTRIM,SEPTRA) 200-40 MG/5ML suspension 20 mL     20 mL Per Tube Every 6 hours 03/31/16 0956 04/03/16 2359   03/26/16 1030  cefTRIAXone (ROCEPHIN) 2 g in dextrose 5 % 50 mL IVPB  Status:  Discontinued     2 g 100 mL/hr over 30 Minutes Intravenous Every 24 hours 03/26/16 1023 03/31/16 0921   03/25/16 1000  ceFEPIme (MAXIPIME) 2 g in dextrose 5 % 50 mL IVPB  Status:  Discontinued     2 g 100 mL/hr over 30 Minutes Intravenous Every 12 hours 03/25/16 0829 03/26/16 1023   03/08/16 1400  levofloxacin (LEVAQUIN) IVPB 750 mg     750  mg 100 mL/hr over 90 Minutes Intravenous Every 24 hours 03/08/16 0912 03/10/16 1501   03/06/16 1400  ceFEPIme (MAXIPIME) 2 g in dextrose 5 % 50 mL IVPB  Status:  Discontinued     2 g 100 mL/hr over 30 Minutes Intravenous Every 12 hours 03/06/16 1344 03/08/16 0912   03/01/16 1330  ceFAZolin (ANCEF) IVPB 2g/100 mL premix     2 g 200 mL/hr over 30 Minutes Intravenous To ShortStay Surgical 02/29/16 0917 03/01/16 1452   02/11/16 0600  piperacillin-tazobactam (ZOSYN) IVPB 3.375 g  Status:  Discontinued     3.375 g 100 mL/hr  over 30 Minutes Intravenous 4 times per day 02/10/16 2214 02/22/16 0853   02/10/16 1730  piperacillin-tazobactam (ZOSYN) IVPB 3.375 g  Status:  Discontinued     3.375 g 12.5 mL/hr over 240 Minutes Intravenous 3 times per day 02/10/16 1727 02/10/16 2214      Assessment/Plan: s/p Procedure(s): ABDOMINAL WOUND CLOSURE (N/A) PERCUTANEOUS TRACHEOSTOMY (N/A)  Jump from hotel/multiple toxic ingestions - on SSRI, sitter for safety. Will check with psych regarding capacity and ability to refuse care at this point S/P ex lap, hepatorraphy, SBR, cholecystectomy, preperitoneal pelvic packing [name] 4/3  Ex lap for acidosis/bleeding 4/4 Thompson Ex lap for bleeding 4/5 [name] S/P ex fix pelvis [name] 4/3 S/P ex lap 4/7 Thompson S/P ex lap 4/10 Thompson S/P sacral screws 4/10 [name] S/P application ABRA 4/12 [name]  S/P abd closure, trach 4/24 [name] Resp failure - tolerating TC Mult B rib FX/R HPTX, B pulm contusions - IR placed Chest tube drain (03/27/16) for pleural effusion, need to f/o with TCTS  Pneumonia - Enterobacter cloacae - on Septra Day #10/10 - completed Multiple toxic ingestions Mult L wrist FXs - splint until f/u with Dr. Melvyn Novas as OP, can WBAT Pelvic FX - S/P ex fix and angioembolization, S/P sacral screw by Dr. [name]. Bed to chair transfers only for another 4-6 weeks, Pivot with to assist with transfers. ABL anemia - Stable Hyperglycemia - Resolved FEN - high risk for aspiration on swallow eval; continue tube feeds, will come down on Klonopin and Seroquel, wean Duragesic at next patch change; will suture G tube back to skin later today VTE - SCD's, Lovenox on hold for chest hemorrhage, surveillance dopplers neg 5/27 Dispo - Keep in [employer] M. Andrey Campanile, MD, FACS General, Bariatric, & Minimally Invasive Surgery [facility], [place]   LOS: 55 days    Atilano Ina 04/04/2016

## 2016-04-05 ENCOUNTER — Inpatient Hospital Stay (HOSPITAL_COMMUNITY): Payer: Medicaid Other

## 2016-04-05 LAB — CBC
HCT: 32 % — ABNORMAL LOW (ref 36.0–46.0)
HEMOGLOBIN: 9.8 g/dL — AB (ref 12.0–15.0)
MCH: 28.4 pg (ref 26.0–34.0)
MCHC: 30.6 g/dL (ref 30.0–36.0)
MCV: 92.8 fL (ref 78.0–100.0)
Platelets: 430 10*3/uL — ABNORMAL HIGH (ref 150–400)
RBC: 3.45 MIL/uL — AB (ref 3.87–5.11)
RDW: 16.2 % — ABNORMAL HIGH (ref 11.5–15.5)
WBC: 10.3 10*3/uL (ref 4.0–10.5)

## 2016-04-05 LAB — COMPREHENSIVE METABOLIC PANEL
ALBUMIN: 1.9 g/dL — AB (ref 3.5–5.0)
ALT: 35 U/L (ref 14–54)
ANION GAP: 6 (ref 5–15)
AST: 39 U/L (ref 15–41)
Alkaline Phosphatase: 160 U/L — ABNORMAL HIGH (ref 38–126)
BUN: 11 mg/dL (ref 6–20)
CHLORIDE: 98 mmol/L — AB (ref 101–111)
CO2: 32 mmol/L (ref 22–32)
Calcium: 8.7 mg/dL — ABNORMAL LOW (ref 8.9–10.3)
Creatinine, Ser: 0.48 mg/dL (ref 0.44–1.00)
GFR calc non Af Amer: >60 mL/min (ref >60)
Glucose, Bld: 132 mg/dL — ABNORMAL HIGH (ref 65–99)
Potassium: 4.2 mmol/L (ref 3.5–5.1)
SODIUM: 136 mmol/L (ref 135–145)
Total Bilirubin: 1.7 mg/dL — ABNORMAL HIGH (ref 0.3–1.2)
Total Protein: 7.2 g/dL (ref 6.5–8.1)

## 2016-04-05 NOTE — Progress Notes (Signed)
PT Cancellation Note  Patient Details Name: [name] Bell MRN: [id number] DOB: Jan 09, 1981   Cancelled Treatment:    Reason Eval/Treat Not Completed: Fatigue/lethargy limiting ability to participate; attempted twice to see pt.  First pt just back to bed after up with lift to recliner then [facility].  Second attempt, pt asleep after SLP tx.  Encouraged bed level exercises, but she continued to refuse.  Encouraged participation in AM for standing trial.   Elray McgregorCynthia Wynn 04/05/2016, 4:04 PM  Sheran Lawlessyndi Wynn, PT [phone] 04/05/2016

## 2016-04-05 NOTE — Progress Notes (Signed)
Patient refused morning medications even with detailed explanation of importance of taking medication and why she needs them. Patient states she understood that she does need them, but does not want any medication because it makes her stomach hurt. Will continue to monitor. Sitter at bedside.

## 2016-04-05 NOTE — Progress Notes (Signed)
      [address]       [place] [id number]             [phone]      Asleep in chair, sitter in room  BP 124/77 mmHg  Pulse 82  Temp(Src) 97.8 F (36.6 C) (Oral)  Resp 26  Ht 5\' 11"  (1.803 m)  Wt 247 lb 5.7 oz (112.2 kg)  BMI 34.51 kg/m2  SpO2 99%  LMP  (LMP Unknown)   Intake/Output Summary (Last 24 hours) at 04/05/16 0916 Last data filed at 04/05/16 0800  Gross per 24 hour  Intake   2910 ml  Output   7035 ml  Net  -4125 ml    CT draining about 100 ml per day. No air leak   PORTABLE CHEST 1 VIEW  COMPARISON: 04/03/2016  FINDINGS:  Cardiac shadow remains enlarged. A tracheostomy tube and right-sided  PICC line are again seen and stable. Right-sided pleural drainage  catheter is noted. Some minimal pleural thickening is noted  laterally. Prominent central pulmonary vascularity is again seen.  The overall appearance is stable. No pneumothorax is noted.  IMPRESSION:  No significant change from the prior exam.  Electronically Signed  By: Alcide CleverMark Lukens M.D.  On: 04/05/2016 07:29  Minimal drainage from CT and no air leak- would keep in place for now  NewbornSteven C. Dorris FetchHendrickson, MD [facility] [phone]

## 2016-04-05 NOTE — Progress Notes (Signed)
35 Days Post-Op  Subjective: Tube feedings resumed No significant events overnight Sitter at bedside  Objective: Vital signs in last 24 hours: Temp:  [97.5 F (36.4 C)-98.5 F (36.9 C)] 97.5 F (36.4 C) (05/29 0435) Pulse Rate:  [85-91] 89 (05/29 0745) Resp:  [23-33] 29 (05/29 0745) BP: (121-133)/(61-77) 124/77 mmHg (05/29 0745) SpO2:  [95 %-98 %] 98 % (05/29 0745) FiO2 (%):  [28 %] 28 % (05/29 0745) Last BM Date: 04/05/16  Intake/Output from previous day: 05/28 0701 - 05/29 0700 In: 3130 [I.V.:1150; NG/GT:1980] Out: 6160 [Urine:6050; Chest Tube:110] Intake/Output this shift: Total I/O In: -  Out: 1500 [Urine:1500]  General appearance: alert and cooperative  Trach collar CT serous drainage Abd - soft, non-tender G-tube secured   Lab Results:   Recent Labs  04/05/16 0415  WBC 10.3  HGB 9.8*  HCT 32.0*  PLT 430*   BMET  Recent Labs  04/03/16 0548 04/05/16 0415  NA 133* 136  K 4.0 4.2  CL 97* 98*  CO2 31 32  GLUCOSE 107* 132*  BUN 8 11  CREATININE 0.40* 0.48  CALCIUM 8.3* 8.7*   PT/INR No results for input(s): LABPROT, INR in the last 72 hours. ABG No results for input(s): PHART, HCO3 in the last 72 hours.  Invalid input(s): PCO2, PO2  Studies/Results: Dg Chest Port 1 View  04/05/2016  CLINICAL DATA:  Right-sided pleural effusion EXAM: PORTABLE CHEST 1 VIEW COMPARISON:  04/03/2016 FINDINGS: Cardiac shadow remains enlarged. A tracheostomy tube and right-sided PICC line are again seen and stable. Right-sided pleural drainage catheter is noted. Some minimal pleural thickening is noted laterally. Prominent central pulmonary vascularity is again seen. The overall appearance is stable. No pneumothorax is noted. IMPRESSION: No significant change from the prior exam. Electronically Signed   By: Alcide Clever M.D.   On: 04/05/2016 07:29   Dg Chest Port 1 View  04/03/2016  CLINICAL DATA:  Right-sided pleural effusion, check chest tube placement EXAM: PORTABLE  CHEST 1 VIEW COMPARISON:  03/31/2016 FINDINGS: Tracheostomy tube and right-sided PICC line are again noted in satisfactory position. The cardiac shadow is stable. Prominent pulmonary vasculature is again seen. The right chest tube drain is again seen and stable in positioning. No pneumothorax is seen. Residual lateral pleural thickening is noted. IMPRESSION: No significant change from the prior exam. Electronically Signed   By: Alcide Clever M.D.   On: 04/03/2016 15:00   Dg Abd Portable 1v  04/03/2016  CLINICAL DATA:  Patient status post peg tube placement. EXAM: PORTABLE ABDOMEN - 1 VIEW COMPARISON:  None. FINDINGS: Oral contrast material is demonstrated within the stomach and proximal duodenum. No evidence for extravasation of contrast. Cholecystectomy clips. Nonobstructed bowel gas pattern. Right lower hemi thorax rib fractures. Postsurgical changes involving the pelvis. IMPRESSION: Injected contrast material appears intraluminal. Electronically Signed   By: Annia Belt M.D.   On: 04/03/2016 16:28    Anti-infectives: Anti-infectives    Start     Dose/Rate Route Frequency Ordered Stop   04/02/16 1200  fluconazole (DIFLUCAN) 40 MG/ML suspension 400 mg     400 mg Per Tube  Once 04/02/16 1026 04/02/16 1442   03/31/16 1000  sulfamethoxazole-trimethoprim (BACTRIM,SEPTRA) 200-40 MG/5ML suspension 40 mL  Status:  Discontinued     40 mL Per Tube Every 12 hours 03/31/16 0927 03/31/16 0956   03/31/16 1000  sulfamethoxazole-trimethoprim (BACTRIM,SEPTRA) 200-40 MG/5ML suspension 20 mL     20 mL Per Tube Every 6 hours 03/31/16 0956 04/03/16 2359   03/26/16  1030  cefTRIAXone (ROCEPHIN) 2 g in dextrose 5 % 50 mL IVPB  Status:  Discontinued     2 g 100 mL/hr over 30 Minutes Intravenous Every 24 hours 03/26/16 1023 03/31/16 0921   03/25/16 1000  ceFEPIme (MAXIPIME) 2 g in dextrose 5 % 50 mL IVPB  Status:  Discontinued     2 g 100 mL/hr over 30 Minutes Intravenous Every 12 hours 03/25/16 0829 03/26/16 1023    03/08/16 1400  levofloxacin (LEVAQUIN) IVPB 750 mg     750 mg 100 mL/hr over 90 Minutes Intravenous Every 24 hours 03/08/16 0912 03/10/16 1501   03/06/16 1400  ceFEPIme (MAXIPIME) 2 g in dextrose 5 % 50 mL IVPB  Status:  Discontinued     2 g 100 mL/hr over 30 Minutes Intravenous Every 12 hours 03/06/16 1344 03/08/16 0912   03/01/16 1330  ceFAZolin (ANCEF) IVPB 2g/100 mL premix     2 g 200 mL/hr over 30 Minutes Intravenous To ShortStay Surgical 02/29/16 0917 03/01/16 1452   02/11/16 0600  piperacillin-tazobactam (ZOSYN) IVPB 3.375 g  Status:  Discontinued     3.375 g 100 mL/hr over 30 Minutes Intravenous 4 times per day 02/10/16 2214 02/22/16 0853   02/10/16 1730  piperacillin-tazobactam (ZOSYN) IVPB 3.375 g  Status:  Discontinued     3.375 g 12.5 mL/hr over 240 Minutes Intravenous 3 times per day 02/10/16 1727 02/10/16 2214      Assessment/Plan: /p Procedure(s): ABDOMINAL WOUND CLOSURE (N/A) PERCUTANEOUS TRACHEOSTOMY (N/A) Jump from hotel/multiple toxic ingestions - on SSRI, sitter for safety.  S/P ex lap, hepatorraphy, SBR, cholecystectomy, preperitoneal pelvic packing [name] 4/3  Ex lap for acidosis/bleeding 4/4 Thompson Ex lap for bleeding 4/5 [name] S/P ex fix pelvis [name] 4/3 S/P ex lap 4/7 Thompson S/P ex lap 4/10 Thompson S/P sacral screws 4/10 [name] S/P application ABRA 4/12 [name]  S/P abd closure, trach 4/24 [name] Resp failure - tolerating trach collar Mult B rib FX/R HPTX, B pulm contusions - IR placed Chest tube drain (03/27/16) for pleural effusion, need to f/o with TCTS  Pneumonia - Enterobacter cloacae - on Septra Day #10/10 - completed Multiple toxic ingestions Mult L wrist FXs - splint until f/u with Dr. Melvyn Novasrtmann as OP, can WBAT Pelvic FX - S/P ex fix and angioembolization, S/P sacral screw by Dr. Carola FrostHandy. Bed to chair transfers only for another 4-6 weeks, Pivot with to assist with transfers. ABL anemia - Stable Hyperglycemia - Resolved FEN - high risk for  aspiration on swallow eval; continue tube feeds, will come down on Klonopin and Seroquel, wean Duragesic at next patch change; G-tube resutured VTE - SCD's, Lovenox on hold for chest hemorrhage, surveillance dopplers neg 5/27 Dispo - Keep in SDU  LOS: 56 days    Kerisha Goughnour K. 04/05/2016

## 2016-04-05 NOTE — Progress Notes (Signed)
Speech Language Pathology Treatment: Dysphagia;Passy Muir Speaking valve  Patient Details Name: [name] Bell MRN: [id number] DOB: 1981/04/11 Today's Date: 04/05/2016 Time: [id number] SLP Time Calculation (min) (ACUTE ONLY): 25 min  Assessment / Plan / Recommendation Clinical Impression  Pt seen at bedside for PMSV and po trials. Pt was noted to be alert, cooperative, eager to work with SLP. Pt tolerated PMSV placement for 20 minutes. Sats were steady throughout placement. Pt was able to communicate verbally, however, voice quality was aphonic. Pt accepted ice chips, exhibiting intermittent cough. RN indicated pt has pulled out her trach multiple times, which may be contributing to laryngeal irritation. Recommend continuing with NPO status, ice chips following oral care with SLP. PMSV placement intermittently throughout the day with full supervision. Based on clinical presentation at bedside, pt is not yet ready for FEES/po intake. At end of session, pt was fatigued and wanted to rest. PMSV removed and placed in holding container.   HPI HPI: [name] Bell is a 35 y.o. female.PMH unknown.Pt attempted suicide: taped DNR note to chest, drank Drano, then [place] [address] [place]Multiple procedures 02/09/16: gel foam embolization of bilateral internal iliac arteries. Ex lap with cholecystectomy, resection SB and mesenteric repair, liver lac repair, application of wound vac. Closed reduction of pelvic ring, external pelvic fixation. Also has right metacarpal fracture in splint. Has #6 cuffed/deflated trach and tolerates trach collar well.      SLP Plan  Continue with current plan of care     Recommendations  Diet recommendations: NPO Medication Administration: Via alternative means      Patient may use [name] Speech Valve: Intermittently with supervision;During all therapies with supervision PMSV Supervision: Full MD: Please consider changing trach tube to :  Cuffless      Oral Care Recommendations: Oral care QID;Oral care prior to ice chip/H20 Follow up Recommendations: Inpatient Rehab Plan: Continue with current plan of care     Leigh AuroraBueche, Celia Brown 04/05/2016, 3:15 PM  Celia B. Murvin NatalBueche, [facility] Center LLCMSP, CCC-SLP [phone]

## 2016-04-05 NOTE — Progress Notes (Addendum)
Patient refused night medication even after detailed explanation why she needed them. Patient states she understood but did not want them. MD made aware. Sitter at bedside.

## 2016-04-06 LAB — GLUCOSE, CAPILLARY
Glucose-Capillary: 117 mg/dL — ABNORMAL HIGH (ref 65–99)
Glucose-Capillary: 153 mg/dL — ABNORMAL HIGH (ref 65–99)

## 2016-04-06 MED ORDER — CETYLPYRIDINIUM CHLORIDE 0.05 % MT LIQD
7.0000 mL | Freq: Two times a day (BID) | OROMUCOSAL | Status: DC
  Administered 2016-04-06 – 2016-04-14 (×6): 7 mL via OROMUCOSAL

## 2016-04-06 MED ORDER — CHLORHEXIDINE GLUCONATE 0.12 % MT SOLN
15.0000 mL | Freq: Two times a day (BID) | OROMUCOSAL | Status: DC
  Administered 2016-04-06 – 2016-04-15 (×14): 15 mL via OROMUCOSAL
  Filled 2016-04-06 (×14): qty 15

## 2016-04-06 NOTE — Progress Notes (Signed)
Referring Physician(s): Dr Frederik SchmidtJay [name]  Supervising Physician: Oley BalmHassell, Daniel  Patient Status: In-pt  Chief Complaint:  Rt chest tube drain placed 5/20  Subjective:  Rt pleural eff no change in CXR for days Output 140 cc in 24 hrs per RN  Allergies: Review of patient's allergies indicates no known allergies.  Medications: Prior to Admission medications   Not on File     Vital Signs: BP 125/75 mmHg  Pulse 83  Temp(Src) 98.8 F (37.1 C) (Oral)  Resp 18  Ht 5\' 11"  (1.803 m)  Wt 219 lb 5.7 oz (99.5 kg)  BMI 30.61 kg/m2  SpO2 97%  LMP  (LMP Unknown)  Physical Exam  Constitutional: She is oriented to person, place, and time.  Neurological: She is alert and oriented to person, place, and time.  Skin: Skin is warm.  Rt chest tube intact No air leak 140 cc /24 hrs  Site clean and dry NT    Imaging: Dg Chest Port 1 View  04/05/2016  CLINICAL DATA:  Right-sided pleural effusion EXAM: PORTABLE CHEST 1 VIEW COMPARISON:  04/03/2016 FINDINGS: Cardiac shadow remains enlarged. A tracheostomy tube and right-sided PICC line are again seen and stable. Right-sided pleural drainage catheter is noted. Some minimal pleural thickening is noted laterally. Prominent central pulmonary vascularity is again seen. The overall appearance is stable. No pneumothorax is noted. IMPRESSION: No significant change from the prior exam. Electronically Signed   By: Alcide CleverMark  Lukens M.D.   On: 04/05/2016 07:29   Dg Chest Port 1 View  04/03/2016  CLINICAL DATA:  Right-sided pleural effusion, check chest tube placement EXAM: PORTABLE CHEST 1 VIEW COMPARISON:  03/31/2016 FINDINGS: Tracheostomy tube and right-sided PICC line are again noted in satisfactory position. The cardiac shadow is stable. Prominent pulmonary vasculature is again seen. The right chest tube drain is again seen and stable in positioning. No pneumothorax is seen. Residual lateral pleural thickening is noted. IMPRESSION: No significant  change from the prior exam. Electronically Signed   By: Alcide CleverMark  Lukens M.D.   On: 04/03/2016 15:00   Dg Abd Portable 1v  04/03/2016  CLINICAL DATA:  Patient status post peg tube placement. EXAM: PORTABLE ABDOMEN - 1 VIEW COMPARISON:  None. FINDINGS: Oral contrast material is demonstrated within the stomach and proximal duodenum. No evidence for extravasation of contrast. Cholecystectomy clips. Nonobstructed bowel gas pattern. Right lower hemi thorax rib fractures. Postsurgical changes involving the pelvis. IMPRESSION: Injected contrast material appears intraluminal. Electronically Signed   By: Annia Beltrew  Davis M.D.   On: 04/03/2016 16:28    Labs:  CBC:  Recent Labs  03/27/16 0600 03/29/16 0445 03/30/16 0605 04/05/16 0415  WBC 8.0 7.0 8.2 10.3  HGB 6.6* 7.7* 8.5* 9.8*  HCT 22.5* 25.6* 28.5* 32.0*  PLT 153 226 261 430*    COAGS:  Recent Labs  02/09/16 1040 02/09/16 1430  02/13/16 0600 02/13/16 1620 02/23/16 1926 02/24/16 0415 03/27/16 0445  INR 2.11* 1.99*  < > 2.04* 1.75*  --  1.47 1.52*  APTT 80* 63*  --  36  --  37  --   --   < > = values in this interval not displayed.  BMP:  Recent Labs  03/27/16 0445 03/30/16 0605 04/03/16 0548 04/05/16 0415  NA 139 140 133* 136  K 3.7 3.3* 4.0 4.2  CL 100* 101 97* 98*  CO2 30 35* 31 32  GLUCOSE 104* 126* 107* 132*  BUN 29* 16 8 11   CALCIUM 8.4* 8.2* 8.3* 8.7*  CREATININE 0.53 0.31* 0.40* 0.48  GFRNONAA >60 >60 >60 >60  GFRAA >60 >60 >60 >60    LIVER FUNCTION TESTS:  Recent Labs  03/11/16 0415 03/18/16 0423 03/26/16 0346 04/05/16 0415  BILITOT 4.8* 3.1* 3.0* 1.7*  AST 70* 37 37 39  ALT 43 33 28 35  ALKPHOS 115 137* 139* 160*  PROT 5.7* 5.8* 5.7* 7.2  ALBUMIN 1.2* 1.4* 1.3* 1.9*    Assessment and Plan:  Rt chest tube intact Plan per Dr Lindie Spruce  Electronically Signed: Ralene Muskrat A 04/06/2016, 2:36 PM   I spent a total of 15 Minutes at the the patient's bedside AND on the patient's hospital floor or unit,  greater than 50% of which was counseling/coordinating care for Rt chest tube

## 2016-04-06 NOTE — Progress Notes (Signed)
Trauma Service Note  Subjective: Patient calm this AM.  Will awaken doing fine.  No distress  Objective: Vital signs in last 24 hours: Temp:  [98.2 F (36.8 C)-99.1 F (37.3 C)] 98.3 F (36.8 C) (05/30 0727) Pulse Rate:  [77-86] 86 (05/30 0817) Resp:  [12-41] 20 (05/30 0817) BP: (122-136)/(70-78) 129/73 mmHg (05/30 0727) SpO2:  [95 %-99 %] 96 % (05/30 0817) FiO2 (%):  [26 %-28 %] 28 % (05/30 0817) Weight:  [99.5 kg (219 lb 5.7 oz)] 99.5 kg (219 lb 5.7 oz) (05/30 0356) Last BM Date: 04/05/16  Intake/Output from previous day: 05/29 0701 - 05/30 0700 In: 2840 [I.V.:1200; NG/GT:1640] Out: 7060 [Urine:6950; Chest Tube:110] Intake/Output this shift: Total I/O In: 30 [Other:30] Out: 0   General: No acute distress.  Lungs: Clear.  Sats are good.  Secretions are diminished and not a problem  Abd: soft, tolerating tube feedings .    Extremities: No changes  Neuro: Much more cooperative, a bit sleepy.  Lab Results: CBC   Recent Labs  04/05/16 0415  WBC 10.3  HGB 9.8*  HCT 32.0*  PLT 430*   BMET  Recent Labs  04/05/16 0415  NA 136  K 4.2  CL 98*  CO2 32  GLUCOSE 132*  BUN 11  CREATININE 0.48  CALCIUM 8.7*   PT/INR No results for input(s): LABPROT, INR in the last 72 hours. ABG No results for input(s): PHART, HCO3 in the last 72 hours.  Invalid input(s): PCO2, PO2  Studies/Results: Dg Chest Port 1 View  04/05/2016  CLINICAL DATA:  Right-sided pleural effusion EXAM: PORTABLE CHEST 1 VIEW COMPARISON:  04/03/2016 FINDINGS: Cardiac shadow remains enlarged. A tracheostomy tube and right-sided PICC line are again seen and stable. Right-sided pleural drainage catheter is noted. Some minimal pleural thickening is noted laterally. Prominent central pulmonary vascularity is again seen. The overall appearance is stable. No pneumothorax is noted. IMPRESSION: No significant change from the prior exam. Electronically Signed   By: Alcide Clever M.D.   On: 04/05/2016 07:29     Anti-infectives: Anti-infectives    Start     Dose/Rate Route Frequency Ordered Stop   04/02/16 1200  fluconazole (DIFLUCAN) 40 MG/ML suspension 400 mg     400 mg Per Tube  Once 04/02/16 1026 04/02/16 1442   03/31/16 1000  sulfamethoxazole-trimethoprim (BACTRIM,SEPTRA) 200-40 MG/5ML suspension 40 mL  Status:  Discontinued     40 mL Per Tube Every 12 hours 03/31/16 0927 03/31/16 0956   03/31/16 1000  sulfamethoxazole-trimethoprim (BACTRIM,SEPTRA) 200-40 MG/5ML suspension 20 mL     20 mL Per Tube Every 6 hours 03/31/16 0956 04/03/16 2359   03/26/16 1030  cefTRIAXone (ROCEPHIN) 2 g in dextrose 5 % 50 mL IVPB  Status:  Discontinued     2 g 100 mL/hr over 30 Minutes Intravenous Every 24 hours 03/26/16 1023 03/31/16 0921   03/25/16 1000  ceFEPIme (MAXIPIME) 2 g in dextrose 5 % 50 mL IVPB  Status:  Discontinued     2 g 100 mL/hr over 30 Minutes Intravenous Every 12 hours 03/25/16 0829 03/26/16 1023   03/08/16 1400  levofloxacin (LEVAQUIN) IVPB 750 mg     750 mg 100 mL/hr over 90 Minutes Intravenous Every 24 hours 03/08/16 0912 03/10/16 1501   03/06/16 1400  ceFEPIme (MAXIPIME) 2 g in dextrose 5 % 50 mL IVPB  Status:  Discontinued     2 g 100 mL/hr over 30 Minutes Intravenous Every 12 hours 03/06/16 1344 03/08/16 0912   03/01/16  1330  ceFAZolin (ANCEF) IVPB 2g/100 mL premix     2 g 200 mL/hr over 30 Minutes Intravenous To ShortStay Surgical 02/29/16 0917 03/01/16 1452   02/11/16 0600  piperacillin-tazobactam (ZOSYN) IVPB 3.375 g  Status:  Discontinued     3.375 g 100 mL/hr over 30 Minutes Intravenous 4 times per day 02/10/16 2214 02/22/16 0853   02/10/16 1730  piperacillin-tazobactam (ZOSYN) IVPB 3.375 g  Status:  Discontinued     3.375 g 12.5 mL/hr over 240 Minutes Intravenous 3 times per day 02/10/16 1727 02/10/16 2214      Assessment/Plan: s/p Procedure(s): ABDOMINAL WOUND CLOSURE PERCUTANEOUS TRACHEOSTOMY d/c foley  Possible transfer out of the unit today. Down size  tracheostomy to 4 uncuffed. Foley has been discontinued.  LOS: 57 days   Marta LamasJames O. Gae BonWyatt, III, MD, FACS [phone] Trauma Surgeon 04/06/2016

## 2016-04-06 NOTE — Progress Notes (Signed)
Occupational Therapy Treatment Patient Details Name: [name] Bell MRN: [id number] DOB: 1980/11/16 Today's Date: 04/06/2016    History of present illness [name] Bell is a 35 y.o. female.She is obese. PMH unknown.Pt attempted suicide: taped DNR note to chest, drank Drano, then jumped off 5 story building ([address]).S/p 02/09/16 gel foam embolization of bil internal iliac arteries.S/p 02/09/16 ex lap with cholecystectomy, resection SB and mesenteric repair, liver lac repair, application or wound vac.S/p 02/09/16 closed reduction of pelvic ring, external pelvic fixation.Also has right metacarpal fracture in splint. Pt on and off intubation via trach/ and on/off trac collar.  Pelvic x- fixator pins removed at bedside on 03/23/16.    OT comments  Pt demonstrates ability to complete sit<>stand with sara plus this session from chair height. Pt completed this task x2. Pt more engaged with therapy and able to progress meeting 3 out 3 goals this session. Goals updated. Pt continues to demonstrate cognitive deficits .    Follow Up Recommendations  LTACH;Supervision/Assistance - 24 hour    Equipment Recommendations  Hospital bed;Wheelchair cushion (measurements OT);Wheelchair (measurements OT);3 in 1 bedside comode;Other (comment) (hoyer)    Recommendations for Other Services Other (comment)    Precautions / Restrictions Precautions Precautions: Fall Precaution Comments: trach feeding tube abdominal binder chest tube Required Braces or Orthoses: Other Brace/Splint Other Brace/Splint: L wrist in splint, abdominal binder Restrictions Weight Bearing Restrictions: No       Mobility Bed Mobility               General bed mobility comments: in chair currently with hoyer lift with RN staff  Transfers Overall transfer level: Needs assistance   Transfers: Sit to/from Stand Sit to Stand: +2 physical assistance;Max assist         General transfer comment: pt requires (A) to  elevate buttock and power up .Pt with (A) of pad from chair initially and after voiding on bed pan therapist locking forearms at hips to help elevate patient.     Balance Overall balance assessment: Needs assistance Sitting-balance support: Bilateral upper extremity supported;Feet supported Sitting balance-[name]: Poor                             ADL Overall ADL's : Needs assistance/impaired                         Toilet Transfer: +2 for physical assistance;Maximal assistance (sara plus with bed pan in chair) Toilet Transfer Details (indicate cue type and reason): pt verbalized need to void bladder. Pt did not void bladder but did have small bowel movement . Pt sit<.stand using sara plus and sitting on bed pan in chair. Pt needed (A) to power up into standing with pad and with therapist bil hand hold at hips from chair           General ADL Comments: Pt demonstrates anterior weight shift with bil UE on chair arm rest and max encouragement. Pt needed (A) but attempting to lateral weight shift and scoot buttock forward in chair. pt engaged and participating. pt several times attempts to be tearful and state "i can't" Therapist just continued to encourage and pt was able to progress. Pt benefits most from more direct commands that open ended suggestions for treatment. pt immediately engaged in chair movement due to therapy only giving this as an opinion for participation. Pt very pleased with her participation today.  Vision                     Perception     Praxis      Cognition   Behavior During Therapy: Flat affect Overall Cognitive Status: Impaired/Different from baseline            Safety/Judgement: Decreased awareness of safety;Decreased awareness of deficits Awareness: Emergent   General Comments: Pt writing "blanket" in the middle of working with therapy. pt needs redirection to task and easily distracted.     Extremity/Trunk  Assessment               Exercises Other Exercises Other Exercises: Pt demonstrates ability to write with R UE and hold paper with L UE    Shoulder Instructions       General Comments      Pertinent Vitals/ Pain       Pain Assessment: Faces Faces Pain Scale: Hurts little more Pain Location: R ribs Pain Descriptors / Indicators: Grimacing Pain Intervention(s): Limited activity within patient's tolerance;Monitored during session;Repositioned  Home Living                                          Prior Functioning/Environment              Frequency Min 3X/week     Progress Toward Goals  OT Goals(current goals can now be found in the care plan section)  Progress towards OT goals: Progressing toward goals  Acute Rehab OT Goals Patient Stated Goal: none stated this session OT Goal Formulation: Patient unable to participate in goal setting Time For Goal Achievement: 04/20/16 Potential to Achieve Goals: Fair ADL Goals Additional ADL Goal #1: Pt will complete 2 step command Additional ADL Goal #2: Pt will sit unsupported for 5 minutes at min guard (A) level as precursor to adls. Additional ADL Goal #3: Pt will demonstrate sit <>Stand with total +2 Mod (A)   Plan Discharge plan remains appropriate    Co-evaluation    PT/OT/SLP Co-Evaluation/Treatment: Yes Reason for Co-Treatment: Complexity of the patient's impairments (multi-system involvement);For patient/therapist safety   OT goals addressed during session: ADL's and self-care;Strengthening/ROM      End of Session Equipment Utilized During Treatment: Gait belt;Oxygen   Activity Tolerance Patient tolerated treatment well   Patient Left in chair;with call bell/phone within reach;with chair alarm set;with nursing/sitter in room   Nurse Communication Mobility status;Precautions;Need for lift equipment (recommend  use of hoyer)        Time: [id number] OT Time Calculation (min): 32  min  Charges: OT General Charges $OT Visit: 1 Procedure OT Treatments $Self Care/Home Management : 8-22 mins  [name] Bell, [name] Bell 04/06/2016, 1:32 PM   [name] Bell, [name] Bell   OTR/L Pager: [phone] Office: [phone] .

## 2016-04-06 NOTE — Progress Notes (Signed)
Nutrition Follow-up  DOCUMENTATION CODES:   Obesity unspecified  INTERVENTION:    Continue Pivot 1.5 via PEG at goal rate of 60 ml/h to provide 2160 kcal, 135 grams protein, and 1092 ml free water daily.  NUTRITION DIAGNOSIS:   Inadequate oral intake related to inability to eat as evidenced by NPO status.  Ongoing  GOAL:   Patient will meet greater than or equal to 90% of their needs  Met  MONITOR:   I & O's, Diet advancement, Labs, TF tolerance  ASSESSMENT:   Pt from [place] with no past medical hx admitted after suicide attempt by ingestion of Drano, 2 bottles of acetaminophen, nyquil, and possibly 6 alprazolam pills and jumping from 5th floor balcony of hotel. 4/3 pt is s/p exp lap, hepatorraphy, SBR, cholecystectomy, preperitoneal pelvic packing, ex fix pelvis, R HPTX, Bil pulmonary contusions. Plan for repeat OR 4/5 for another possible SBR. Abdomen remains open.   Spoke with RN about patient. She is tolerating TF without difficulty. Patient is currently receiving Pivot 1.5 via PEG at 60 ml/h (1440 ml/day) to provide 2160 kcals, 135 gm protein, 1092 ml free water daily.  Also receiving 200 ml free water flushes every 8 hours=600 ml. Remains NPO. On trach collar. Right chest tube in place for R pleural effusion. Plans for eventual d/c to inpatient psychiatric hospital.  Diet Order:  Diet NPO time specified  Skin:  Reviewed, no issues (incisions)  Last BM:  5/29  Height:   Ht Readings from Last 1 Encounters:  03/18/16 _0  (1.803 m)    Weight:   Wt Readings from Last 1 Encounters:  04/06/16 219 lb 5.7 oz (99.5 kg)    Ideal Body Weight:  70.4 kg  BMI:  Body mass index is 30.61 kg/(m^2).  Estimated Nutritional Needs:   Kcal:  2100-2300  Protein:  130-150 grams  Fluid:  >2 L/day  EDUCATION NEEDS:   No education needs identified at this time  Molli Barrows, [place], [place], [place] Pager [phone] After Hours Pager [phone]

## 2016-04-06 NOTE — Progress Notes (Signed)
Pt admitted to the unit with sitter. Pt is stable, alert and oriented per baseline. Oriented to room, staff, and call bell. Educated to call for any assistance. Bed in lowest position, call bell within reach- will continue to monitor.

## 2016-04-06 NOTE — Progress Notes (Signed)
Physical Therapy Treatment Patient Details Name: [name] Bell MRN: [id number] DOB: 1980/12/01 Today's Date: 04/06/2016    History of Present Illness [name] Bell is a 35 y.o. female.She is obese. PMH unknown.Pt attempted suicide: taped DNR note to chest, drank Drano, then jumped off 5 story building ([address] [place]).S/p 02/09/16 gel foam embolization of bil internal iliac arteries.S/p 02/09/16 ex lap with cholecystectomy, resection SB and mesenteric repair, liver lac repair, application or wound vac.S/p 02/09/16 closed reduction of pelvic ring, external pelvic fixation.Also has right metacarpal fracture in splint. Pt on and off intubation via trach/ and on/off trac collar.  Pelvic x- fixator pins removed at bedside on 03/23/16.     PT Comments    Able to use Clarise Cruz plus to perform sit to stand transfer X2 this session; Engaged with therapy and participating; Reviewed goals, current goals continue to be appropriate; While she did transfer to stand with +2 max assist, I would like for her to show more consistency before deeming the goal has been met;   Will plan to continue work with the [employer] on transfers, and also work on mobility/reciprocal scooting in sitting toward the goals of more options of movement in her repertoire for transfers and mobility.    Follow Up Recommendations  CIR     Equipment Recommendations  Wheelchair (measurements PT);Wheelchair cushion (measurements PT);Hospital bed;Other (comment);3in1 (PT) (hoyer lift)    Recommendations for Other Services       Precautions / Restrictions Precautions Precautions: Fall Precaution Comments: trach feeding tube abdominal binder chest tube Required Braces or Orthoses: Other Brace/Splint Other Brace/Splint: L wrist in splint, abdominal binder Restrictions Weight Bearing Restrictions: No LUE Weight Bearing: Weight bearing as tolerated RLE Weight Bearing: Weight bearing as tolerated (for transfers only) LLE Weight  Bearing: Weight bearing as tolerated (for transfers only) Other Position/Activity Restrictions: WBAT B    Mobility  Bed Mobility               General bed mobility comments: in chair currently with hoyer lift with RN staff  Transfers Overall transfer level: Needs assistance   Transfers: Sit to/from Stand Sit to Stand: +2 physical assistance;Max assist         General transfer comment: pt requires (A) to elevate buttocks (used bilateral support at bed pad she was sitting on) and power up .Pt with (A) of pad from chair initially and after voiding on bed pan therapists locking forearms at hips to help elevate patient.   Ambulation/Gait                 Stairs            Wheelchair Mobility    Modified Rankin (Stroke Patients Only)       Balance Overall balance assessment: Needs assistance Sitting-balance support: Bilateral upper extremity supported;Feet supported Sitting balance-[name]: Poor Sitting balance - Comments: poor with periods of fair as pt is able to take more time unsupported EOB today than in previous sessions.  She does fatigue and need some periods of supported rest, but did very well trying to pull herself forward from supported sitting to unsupported sitting in recliner today.                              Cognition Arousal/Alertness: Awake/alert Behavior During Therapy: Flat affect Overall Cognitive Status: Impaired/Different from baseline           Safety/Judgement: Decreased awareness of  safety;Decreased awareness of deficits Awareness: Emergent Problem Solving: Slow processing;Decreased initiation;Difficulty sequencing;Requires verbal cues;Requires tactile cues General Comments: Pt writing "blanket" in the middle of working with therapy. pt needs redirection to task and easily distracted.     Exercises General Exercises - Lower Extremity Quad Sets: AROM;Both;5 reps Other Exercises Other Exercises: Pt  demonstrates ability to write with R UE and hold paper with L UE     General Comments        Pertinent Vitals/Pain Pain Assessment: Faces Faces Pain Scale: Hurts little more Pain Location: R ribs Pain Descriptors / Indicators: Aching;Grimacing Pain Intervention(s): Limited activity within patient's tolerance;Monitored during session;Other (comment) (Notified RN)    Home Living                      Prior Function            PT Goals (current goals can now be found in the care plan section) Acute Rehab PT Goals Patient Stated Goal: none stated this session; was able to communicate the need to urinate PT Goal Formulation: Patient unable to participate in goal setting Time For Goal Achievement: 04/20/16 Potential to Achieve Goals: Good Progress towards PT goals: Progressing toward goals    Frequency  Min 5X/week    PT Plan Current plan remains appropriate    Co-evaluation PT/OT/SLP Co-Evaluation/Treatment: Yes Reason for Co-Treatment: Complexity of the patient's impairments (multi-system involvement);For patient/therapist safety PT goals addressed during session: Mobility/safety with mobility OT goals addressed during session: ADL's and self-care;Strengthening/ROM     End of Session Equipment Utilized During Treatment: Oxygen Clarise Cruz) Activity Tolerance: Patient tolerated treatment well Patient left: in chair;with call bell/phone within reach     Time: 1113-1140 PT Time Calculation (min) (ACUTE ONLY): 27 min  Charges:  $Therapeutic Activity: 8-22 mins                    G Codes:      Quin Hoop 04/06/2016, 3:38 PM  Roney Marion, [place] Pager [phone] Office [phone]

## 2016-04-06 NOTE — Clinical Social Work Psych Note (Signed)
Psych CSW continuing to follow and assist with disposition and inpatient psychiatric hospitalization.  Vickii PennaGina Aristotelis Vilardi, LCSW [phone]  [facility] Service Line Licensed Clinical Social Worker

## 2016-04-06 NOTE — Progress Notes (Signed)
RT note: RT with Lysbeth PennerMary Smith RRT and [facility] RT student Jodelle GreenWhitley down sized patients trach to a #4 cfs trach. Patient tolerated well, positive easy cap and BLB sounds. Vital signs stable at this time.

## 2016-04-07 ENCOUNTER — Encounter (HOSPITAL_COMMUNITY): Payer: Self-pay | Admitting: Surgery

## 2016-04-07 LAB — GLUCOSE, CAPILLARY
GLUCOSE-CAPILLARY: 144 mg/dL — AB (ref 65–99)
GLUCOSE-CAPILLARY: 150 mg/dL — AB (ref 65–99)
Glucose-Capillary: 148 mg/dL — ABNORMAL HIGH (ref 65–99)
Glucose-Capillary: 149 mg/dL — ABNORMAL HIGH (ref 65–99)
Glucose-Capillary: 150 mg/dL — ABNORMAL HIGH (ref 65–99)

## 2016-04-07 NOTE — Progress Notes (Signed)
Patient ID: [name] Bell, female   DOB: [birth year], 35 y.o.   MRN: [id number] 37 Days Post-Op  Subjective: Does not offer complaint  Objective: Vital signs in last 24 hours: Temp:  [97.6 F (36.4 C)-100 F (37.8 C)] 98.5 F (36.9 C) (05/31 0401) Pulse Rate:  [82-87] 84 (05/31 0401) Resp:  [16-28] 23 (05/31 0401) BP: (114-125)/(66-75) 123/74 mmHg (05/31 0401) SpO2:  [95 %-100 %] 96 % (05/31 0401) FiO2 (%):  [28 %] 28 % (05/31 0401) Weight:  [98.5 kg (217 lb 2.5 oz)] 98.5 kg (217 lb 2.5 oz) (05/31 0401) Last BM Date: 04/06/16  Intake/Output from previous day: 05/30 0701 - 05/31 0700 In: 2820 [I.V.:1110; NG/GT:1340] Out: 2175 [Urine:2125; Chest Tube:50] Intake/Output this shift: Total I/O In: -  Out: 400 [Urine:400]  General appearance: cooperative Neck: trach with HTC Cardio: regular rate and rhythm GI: soft, wound with clean granulation  Lab Results: CBC   Recent Labs  04/05/16 0415  WBC 10.3  HGB 9.8*  HCT 32.0*  PLT 430*   BMET  Recent Labs  04/05/16 0415  NA 136  K 4.2  CL 98*  CO2 32  GLUCOSE 132*  BUN 11  CREATININE 0.48  CALCIUM 8.7*   PT/INR No results for input(s): LABPROT, INR in the last 72 hours. ABG No results for input(s): PHART, HCO3 in the last 72 hours.  Invalid input(s): PCO2, PO2  Studies/Results: No results found.  Anti-infectives: Anti-infectives    Start     Dose/Rate Route Frequency Ordered Stop   04/02/16 1200  fluconazole (DIFLUCAN) 40 MG/ML suspension 400 mg     400 mg Per Tube  Once 04/02/16 1026 04/02/16 1442   03/31/16 1000  sulfamethoxazole-trimethoprim (BACTRIM,SEPTRA) 200-40 MG/5ML suspension 40 mL  Status:  Discontinued     40 mL Per Tube Every 12 hours 03/31/16 0927 03/31/16 0956   03/31/16 1000  sulfamethoxazole-trimethoprim (BACTRIM,SEPTRA) 200-40 MG/5ML suspension 20 mL     20 mL Per Tube Every 6 hours 03/31/16 0956 04/03/16 2359   03/26/16 1030  cefTRIAXone (ROCEPHIN) 2 g in dextrose 5 % 50 mL IVPB   Status:  Discontinued     2 g 100 mL/hr over 30 Minutes Intravenous Every 24 hours 03/26/16 1023 03/31/16 0921   03/25/16 1000  ceFEPIme (MAXIPIME) 2 g in dextrose 5 % 50 mL IVPB  Status:  Discontinued     2 g 100 mL/hr over 30 Minutes Intravenous Every 12 hours 03/25/16 0829 03/26/16 1023   03/08/16 1400  levofloxacin (LEVAQUIN) IVPB 750 mg     750 mg 100 mL/hr over 90 Minutes Intravenous Every 24 hours 03/08/16 0912 03/10/16 1501   03/06/16 1400  ceFEPIme (MAXIPIME) 2 g in dextrose 5 % 50 mL IVPB  Status:  Discontinued     2 g 100 mL/hr over 30 Minutes Intravenous Every 12 hours 03/06/16 1344 03/08/16 0912   03/01/16 1330  ceFAZolin (ANCEF) IVPB 2g/100 mL premix     2 g 200 mL/hr over 30 Minutes Intravenous To [facility] 02/29/16 0917 03/01/16 1452   02/11/16 0600  piperacillin-tazobactam (ZOSYN) IVPB 3.375 g  Status:  Discontinued     3.375 g 100 mL/hr over 30 Minutes Intravenous 4 times per day 02/10/16 2214 02/22/16 0853   02/10/16 1730  piperacillin-tazobactam (ZOSYN) IVPB 3.375 g  Status:  Discontinued     3.375 g 12.5 mL/hr over 240 Minutes Intravenous 3 times per day 02/10/16 1727 02/10/16 2214      Assessment/Plan: Jump from hotel/multiple  toxic ingestions - on SSRI, sitter for safety.  S/P ex lap, hepatorraphy, SBR, cholecystectomy, preperitoneal pelvic packing [name] 4/3  Ex lap for acidosis/bleeding 4/4 Chadric Kimberley Ex lap for bleeding 4/5 [name] S/P ex fix pelvis [name] 4/3 S/P ex lap 4/7 Peggy Monk S/P ex lap 4/10 Daking Westervelt S/P sacral screws 4/10 [name] S/P application ABRA 4/12 [name]  S/P abd closure, trach 4/24 [name] Resp failure - tolerating trach collar Mult B rib FX/R HPTX, B pulm contusions - IR placed Chest tube drain (03/27/16) for pleural effusion, TCTS following Multiple toxic ingestions Mult L wrist FXs - splint until f/u with Dr. Melvyn Novasrtmann as OP, can WBAT Pelvic FX - S/P ex fix and angioembolization, S/P sacral screw by Dr. Carola FrostHandy. Bed to chair transfers  only for another 4-6 weeks, Pivot with to assist with transfers. ABL anemia FEN - TF Trach now 4 cuffless VTE - SCD's, Lovenox on hold for chest hemorrhage, surveillance dopplers neg 5/27 Dispo [facility]  LOS: 58 days    [name] Myha Arizpe, MD, MPH, [facility] Trauma: [phone] General Surgery: [phone]  04/07/2016

## 2016-04-07 NOTE — Consult Note (Signed)
CH attempted to offer support to pt; pt not interested in prayer or chaplain support at this time.  Spiritual Care staff will continue to monitor.  Erline LevineMichael I Viktor Philipp 2:01 PM

## 2016-04-07 NOTE — Progress Notes (Signed)
Physical Therapy Treatment Patient Details Name: [name] Bell MRN: [id number] DOB: Jul 09, 1981 Today's Date: 04/07/2016    History of Present Illness [name] Bell is a 35 y.o. female.She is obese. PMH unknown.Pt attempted suicide: taped DNR note to chest, drank Drano, then jumped off 5 story building ([address] [place]).S/p 02/09/16 gel foam embolization of bil internal iliac arteries.S/p 02/09/16 ex lap with cholecystectomy, resection SB and mesenteric repair, liver lac repair, application or wound vac.S/p 02/09/16 closed reduction of pelvic ring, external pelvic fixation.Also has right metacarpal fracture in splint. Pt on and off intubation via trach/ and on/off trac collar.  Pelvic x- fixator pins removed at bedside on 03/23/16.     PT Comments    Pt performed increased mobility, Will try transfer with stedy next treatment and advance to [facility] mobility, spoke to supervising PT to add [facility] goals for next tx.  Pt tearful after transfer to chair and reports she is tearful because she is happy.  Pt educated to sit for 30 min to an hour and PTA educated staff on how to mobilize pt for back to bed transfer.      Follow Up Recommendations  CIR     Equipment Recommendations  Wheelchair (measurements PT);Wheelchair cushion (measurements PT);Hospital bed;Other (comment);3in1 (PT)    Recommendations for Other Services Rehab consult     Precautions / Restrictions Precautions Precautions: Fall Precaution Comments: trach feeding tube abdominal binder chest tube Required Braces or Orthoses: Other Brace/Splint Other Brace/Splint: L wrist in splint, abdominal binder Restrictions Weight Bearing Restrictions: Yes LUE Weight Bearing: Weight bearing as tolerated RLE Weight Bearing: Weight bearing as tolerated LLE Weight Bearing: Weight bearing as tolerated    Mobility  Bed Mobility Overal bed mobility: Needs Assistance;+2 for physical assistance       Supine to sit: Max assist;+2 for physical  assistance (flat spin on bottom with use of bed pad.  )     General bed mobility comments: Cues for hand placement and advancement of LEs to edge of bed.    Transfers Overall transfer level: Needs assistance   Transfers: Sit to/from Stand Sit to Stand: +2 physical assistance;Max assist         General transfer comment: pt requires (A) to elevate buttocks (used bilateral support at bed pad she was sitting on) and power up .Pt with (A) of pad from chair initially and after voiding on bed pan therapists locking forearms at hips to help elevate patient.   Ambulation/Gait Ambulation/Gait assistance:  (orders will not allow.  )               Stairs            Wheelchair Mobility    Modified Rankin (Stroke Patients Only)       Balance Overall balance assessment: Needs assistance   Sitting balance-[name]: Poor Sitting balance - Comments: poor with periods of fair as pt is able to take more time unsupported EOB today than in previous sessions.  She does fatigue and need some periods of supported rest, but did very well trying to pull herself forward from supported sitting to unsupported sitting in recliner today.       Standing balance-[name]: Zero                      Cognition Arousal/Alertness: Awake/alert Behavior During Therapy: Flat affect;Anxious Overall Cognitive Status: Within Functional Limits for tasks assessed  Exercises Total Joint Exercises Ankle Circles/Pumps: AROM;Both;20 reps;Supine Quad Sets: AROM;Both;10 reps;Supine Towel Squeeze: AROM;Both;10 reps;Supine    General Comments        Pertinent Vitals/Pain Pain Assessment: No/denies pain Faces Pain Scale: Hurts even more Pain Location: R ribs, pelvis Pain Descriptors / Indicators: Aching;Grimacing Pain Intervention(s): Limited activity within patient's tolerance;Premedicated before session;Repositioned    Home Living                       Prior Function            PT Goals (current goals can now be found in the care plan section) Acute Rehab PT Goals Potential to Achieve Goals: Good Progress towards PT goals: Progressing toward goals    Frequency  Min 5X/week    PT Plan Current plan remains appropriate    Co-evaluation             End of Session Equipment Utilized During Treatment: Oxygen [place] Dec(Sara +) Activity Tolerance: Patient tolerated treatment well Patient left: in chair;with call bell/phone within reach     Time: 1353-1430 PT Time Calculation (min) (ACUTE ONLY): 37 min  Charges:  $Therapeutic Activity: 23-37 mins                    G Codes:      [name] Bell 04/07/2016, 5:13 PM [name] Bell, PTA pager [phone]

## 2016-04-07 NOTE — Progress Notes (Signed)
Speech Language Pathology Treatment: [name] Bell Speaking valve  Patient Details Name: [name] Bell MRN: [id number] DOB: [birth year] Today's Date: 04/07/2016 Time: [id number] SLP Time Calculation (min) (ACUTE ONLY): 12 min  Assessment / Plan / Recommendation Clinical Impression  Pt politely declined PO trials this afternoon despite encouragement from SLP, but was agreeable to placement of PMSV. She had just returned to her bed from being up in the chair and reported fatigue. She was responsive to questions with Mod cues for increased volume to improve intelligibility of speech, but was primarily communicating at a whisper level. Will continue to follow.   HPI HPI: [name] Bell is a 35 y.o. female.PMH unknown.Pt attempted suicide: taped DNR note to chest, drank Drano, then [place] [place]S/p 02/09/16 gel foam embolization of bil internal iliac arteries.S/p 02/09/16 ex lap with cholecystectomy, resection SB and mesenteric repair, liver lac repair, application or wound vac.S/p 02/09/16 closed reduction of pelvic ring, external pelvic fixation.Also has right metacarpal fracture in splint. Has #6 cuffed/deflated trach and has tolerates trach collar      SLP Plan  Continue with current plan of care     Recommendations         Patient may use Passy-Bell Speech Valve: Intermittently with supervision;During all therapies with supervision PMSV Supervision: Full      Oral Care Recommendations: Oral care QID;Oral care prior to ice chip/H20 Follow up Recommendations: Inpatient Rehab Plan: Continue with current plan of care     GO               [name] Bell, M.A. CCC-SLP [phone]  [name] Bell, [name] Bell 04/07/2016, 3:33 PM

## 2016-04-07 NOTE — Progress Notes (Signed)
Referring Physician(s): Dr [name] Thompson  Supervising Physician: Gilmer MorWagner, Jaime  Patient Status: In-pt  Chief Complaint:  Rt chest tube drain placed 5/20   Subjective:  1400 cc in Pleurvac Better daily  Allergies: Review of patient's allergies indicates no known allergies.  Medications: Prior to Admission medications   Not on File     Vital Signs: BP 114/67 mmHg  Pulse 85  Temp(Src) 98.6 F (37 C) (Oral)  Resp 20  Ht 5\' 11"  (1.803 m)  Wt 217 lb 2.5 oz (98.5 kg)  BMI 30.30 kg/m2  SpO2 96%  LMP  (LMP Unknown)  Physical Exam  Neurological: She is alert.  Skin: Skin is warm and dry.  Site of rt chest tube clean and dry NT no bleeding 1400 cc in pleurvac No air leak  Nursing note and vitals reviewed.   Imaging: Dg Chest Port 1 View  04/05/2016  CLINICAL DATA:  Right-sided pleural effusion EXAM: PORTABLE CHEST 1 VIEW COMPARISON:  04/03/2016 FINDINGS: Cardiac shadow remains enlarged. A tracheostomy tube and right-sided PICC line are again seen and stable. Right-sided pleural drainage catheter is noted. Some minimal pleural thickening is noted laterally. Prominent central pulmonary vascularity is again seen. The overall appearance is stable. No pneumothorax is noted. IMPRESSION: No significant change from the prior exam. Electronically Signed   By: Alcide CleverMark  Lukens M.D.   On: 04/05/2016 07:29   Dg Chest Port 1 View  04/03/2016  CLINICAL DATA:  Right-sided pleural effusion, check chest tube placement EXAM: PORTABLE CHEST 1 VIEW COMPARISON:  03/31/2016 FINDINGS: Tracheostomy tube and right-sided PICC line are again noted in satisfactory position. The cardiac shadow is stable. Prominent pulmonary vasculature is again seen. The right chest tube drain is again seen and stable in positioning. No pneumothorax is seen. Residual lateral pleural thickening is noted. IMPRESSION: No significant change from the prior exam. Electronically Signed   By: Alcide CleverMark  Lukens M.D.   On:  04/03/2016 15:00   Dg Abd Portable 1v  04/03/2016  CLINICAL DATA:  Patient status post peg tube placement. EXAM: PORTABLE ABDOMEN - 1 VIEW COMPARISON:  None. FINDINGS: Oral contrast material is demonstrated within the stomach and proximal duodenum. No evidence for extravasation of contrast. Cholecystectomy clips. Nonobstructed bowel gas pattern. Right lower hemi thorax rib fractures. Postsurgical changes involving the pelvis. IMPRESSION: Injected contrast material appears intraluminal. Electronically Signed   By: Annia Beltrew  Davis M.D.   On: 04/03/2016 16:28    Labs:  CBC:  Recent Labs  03/27/16 0600 03/29/16 0445 03/30/16 0605 04/05/16 0415  WBC 8.0 7.0 8.2 10.3  HGB 6.6* 7.7* 8.5* 9.8*  HCT 22.5* 25.6* 28.5* 32.0*  PLT 153 226 261 430*    COAGS:  Recent Labs  02/09/16 1040 02/09/16 1430  02/13/16 0600 02/13/16 1620 02/23/16 1926 02/24/16 0415 03/27/16 0445  INR 2.11* 1.99*  < > 2.04* 1.75*  --  1.47 1.52*  APTT 80* 63*  --  36  --  37  --   --   < > = values in this interval not displayed.  BMP:  Recent Labs  03/27/16 0445 03/30/16 0605 04/03/16 0548 04/05/16 0415  NA 139 140 133* 136  K 3.7 3.3* 4.0 4.2  CL 100* 101 97* 98*  CO2 30 35* 31 32  GLUCOSE 104* 126* 107* 132*  BUN 29* 16 8 11   CALCIUM 8.4* 8.2* 8.3* 8.7*  CREATININE 0.53 0.31* 0.40* 0.48  GFRNONAA >60 >60 >60 >60  GFRAA >60 >60 >60 >60  LIVER FUNCTION TESTS:  Recent Labs  03/11/16 0415 03/18/16 0423 03/26/16 0346 04/05/16 0415  BILITOT 4.8* 3.1* 3.0* 1.7*  AST 70* 37 37 39  ALT 43 33 28 35  ALKPHOS 115 137* 139* 160*  PROT 5.7* 5.8* 5.7* 7.2  ALBUMIN 1.2* 1.4* 1.3* 1.9*    Assessment and Plan:  Rt chest tube drain intact Will follow' Plan per Trauma MD  Electronically Signed: Ralene Muskrat A 04/07/2016, 2:23 PM   I spent a total of 15 Minutes at the the patient's bedside AND on the patient's hospital floor or unit, greater than 50% of which was counseling/coordinating care  for rt chest tube drain

## 2016-04-08 ENCOUNTER — Inpatient Hospital Stay (HOSPITAL_COMMUNITY): Payer: Medicaid Other

## 2016-04-08 LAB — GLUCOSE, CAPILLARY
GLUCOSE-CAPILLARY: 142 mg/dL — AB (ref 65–99)
GLUCOSE-CAPILLARY: 141 mg/dL — AB (ref 65–99)
Glucose-Capillary: 126 mg/dL — ABNORMAL HIGH (ref 65–99)
Glucose-Capillary: 132 mg/dL — ABNORMAL HIGH (ref 65–99)
Glucose-Capillary: 140 mg/dL — ABNORMAL HIGH (ref 65–99)
Glucose-Capillary: 142 mg/dL — ABNORMAL HIGH (ref 65–99)
Glucose-Capillary: 190 mg/dL — ABNORMAL HIGH (ref 65–99)

## 2016-04-08 NOTE — Progress Notes (Signed)
Physical Therapy Treatment Patient Details Name: [name] BobKatia D Dopson MRN: [id number] DOB: [birth year] Today's Date: 04/08/2016    History of Present Illness [name] Bell is a 35 y.o. female.She is obese. PMH unknown.Pt attempted suicide: taped DNR note to chest, drank Drano, then [place]S/p 02/09/16 gel foam embolization of bil internal iliac arteries.S/p 02/09/16 ex lap with cholecystectomy, resection SB and mesenteric repair, liver lac repair, application or wound vac.S/p 02/09/16 closed reduction of pelvic ring, external pelvic fixation.Also has right metacarpal fracture in splint. Pt on and off intubation via trach/ and on/off trac collar.  Pelvic x- fixator pins removed at bedside on 03/23/16.     PT Comments    Pt performed increased mobility and progressed to standing with stedy lift equipment.  Pt performed WC mobility with B LE propulsion and fatigued requiring min to mod assist with WC.    Follow Up Recommendations  CIR     Equipment Recommendations  Wheelchair (measurements PT);Wheelchair cushion (measurements PT);Hospital bed;Other (comment);3in1 (PT)    Recommendations for Other Services Rehab consult     Precautions / Restrictions Precautions Precautions: Fall Precaution Comments: trach feeding tube abdominal binder chest tube Required Braces or Orthoses: Other Brace/Splint Other Brace/Splint: L wrist in splint, abdominal binder Restrictions Weight Bearing Restrictions: Yes LUE Weight Bearing: Weight bearing as tolerated RLE Weight Bearing: Weight bearing as tolerated LLE Weight Bearing: Weight bearing as tolerated Other Position/Activity Restrictions: WBAT B    Mobility  Bed Mobility Overal bed mobility: Needs Assistance;+2 for physical assistance Bed Mobility: Supine to Sit     Supine to sit: Max assist;+2 for physical assistance (flat spin with chux pad.  )     General bed mobility comments: cues to scoot  forward  Transfers Overall transfer level: Needs assistance   Transfers: Sit to/from Stand Sit to Stand: Mod assist;Max assist         General transfer comment: Pt required cues for hand placement on stedy to perform standing trials, required mod assistance +2 from bed, max assistance +2 from lower seated bed side commode.  Pt performed partial sit to stand from higher stedy sit with min assistance +2.    Ambulation/Gait                 Administratortairs            Wheelchair Mobility Wheelchair Mobility Wheelchair mobility: Yes Wheelchair propulsion: Both lower extermities Wheelchair parts: Other (comment) (did not review WC parts.  ) Distance: 250 ft with min-mod assist.  Pt propelled with B LEs while PTA assisted patient.   Wheelchair Assistance Details (indicate cue type and reason): Pt required 2 rest periods during 250 ft trial.    Modified Rankin (Stroke Patients Only)       Balance     Sitting balance-[name]: Poor Sitting balance - Comments: needs min guard for trunk support when sitting on sara stedy     Standing balance-[name]: Poor                      Cognition Arousal/Alertness: Awake/alert Behavior During Therapy: WFL for tasks assessed/performed Overall Cognitive Status: Within Functional Limits for tasks assessed                 General Comments: much brighter affect as session progressed and as she got out of room    Exercises      General Comments  Pertinent Vitals/Pain Pain Assessment: 0-10 Pain Score: 6  Faces Pain Scale: Hurts a little bit Pain Location: not specified. Pain Descriptors / Indicators: Aching;Grimacing;Guarding Pain Intervention(s): Limited activity within patient's tolerance;Premedicated before session;Repositioned    Home Living                      Prior Function            PT Goals (current goals can now be found in the care plan section) Acute Rehab PT Goals Patient  Stated Goal: Go home Potential to Achieve Goals: Good Additional Goals Additional Goal #1: Pt will propel WC >100', supervision for propel, min assist for parts/set up Progress towards PT goals: Progressing toward goals    Frequency  Min 5X/week    PT Plan Current plan remains appropriate    Co-evaluation PT/OT/SLP Co-Evaluation/Treatment: Yes Reason for Co-Treatment: Complexity of the patient's impairments (multi-system involvement);For patient/therapist safety PT goals addressed during session: Mobility/safety with mobility OT goals addressed during session: ADL's and self-care     End of Session Equipment Utilized During Treatment: Oxygen (stedy) Activity Tolerance: Patient tolerated treatment well Patient left: in chair;with call bell/phone within reach     Time: 1120-1155 PT Time Calculation (min) (ACUTE ONLY): 35 min  Charges:  $Therapeutic Activity: 8-22 mins                    G Codes:      Florestine Avers 26-Apr-2016, 3:47 PM  Joycelyn Rua, PTA pager [phone]

## 2016-04-08 NOTE — Progress Notes (Signed)
Speech Language Pathology Treatment: Dysphagia;Passy Muir Speaking valve  Patient Details Name: [name] Bell MRN: [id number] DOB: 07-30-1981 Today's Date: 04/08/2016 Time: [id number] SLP Time Calculation (min) (ACUTE ONLY): 30 min  Assessment / Plan / Recommendation Clinical Impression  Pt demonstrates excellent tolerance of PMSV with 4 cuffless trach. Able to redirect air to upper airway and achieve soft phonation with moderate cues to transition from throat clear to humming to sustained phonation to speech tasks. At conversation level pt continues to whisper and does not carry over phonation. Recommend pt wear PMSV during all waking hours with intermittent supervision. Disucussed with staff.  Also trials thin dn puree textures with excellent success, no signs of aspiration. Will proceed with MBS in am tomorrow given risk of potential for silent aspiration and ongoing dysphonia. Anticipate diet initiation. Pt agreeable.    HPI HPI: [name] Bell is a 35 y.o. female.PMH unknown.Pt attempted suicide: taped DNR note to chest, drank Drano, then [place] [place]S/p 02/09/16 gel foam embolization of bil internal iliac arteries.S/p 02/09/16 ex lap with cholecystectomy, resection SB and mesenteric repair, liver lac repair, application or wound vac.S/p 02/09/16 closed reduction of pelvic ring, external pelvic fixation.Also has right metacarpal fracture in splint. Has #6 cuffed/deflated trach and has tolerates trach collar      SLP Plan  Continue with current plan of care     Recommendations  Diet recommendations: NPO      Patient may use [name] Speech Valve: During all therapies with supervision;During all waking hours (remove during sleep);During PO intake/meals PMSV Supervision: Intermittent      Plan: Continue with current plan of care     GO               [facility] HeartBonnie Rachard Isidro, MA CCC-SLP [phone]  [name] Bell, [name] Bell 04/08/2016, 2:30 PM

## 2016-04-08 NOTE — Progress Notes (Signed)
Occupational Therapy Treatment Patient Details Name: [name] Bell MRN: [id number] DOB: Jun 27, 1981 Today's Date: 04/08/2016    History of present illness [name] Bell is a 35 y.o. female.She is obese. PMH unknown.Pt attempted suicide: taped DNR note to chest, drank Drano, then jumped off 5 story building ([address] [place]).S/p 02/09/16 gel foam embolization of bil internal iliac arteries.S/p 02/09/16 ex lap with cholecystectomy, resection SB and mesenteric repair, liver lac repair, application or wound vac.S/p 02/09/16 closed reduction of pelvic ring, external pelvic fixation.Also has right metacarpal fracture in splint. Pt on and off intubation via trach/ and on/off trac collar.  Pelvic x- fixator pins removed at bedside on 03/23/16.    OT comments  Pt very motivated. Progressed to sara stedy today, but needs min guard for balance.  Follow Up Recommendations  CIR    Equipment Recommendations  Hospital bed;Wheelchair cushion (measurements OT);Wheelchair (measurements OT);3 in 1 bedside comode;Other (comment)    Recommendations for Other Services      Precautions / Restrictions Precautions Precautions: Fall Precaution Comments: trach feeding tube abdominal binder chest tube Restrictions LUE Weight Bearing: Weight bearing as tolerated RLE Weight Bearing: Weight bearing as tolerated LLE Weight Bearing: Weight bearing as tolerated       Mobility Bed Mobility         Supine to sit: Max assist;+2 for physical assistance (flat spin with chux pad)     General bed mobility comments: cues to scoot forward  Transfers     Transfers: Sit to/from Stand Sit to Stand: Mod assist;Max assist;+2 physical assistance         General transfer comment: mod from high bed; max from 3:1    Balance       Sitting balance - Comments: needs min guard for trunk support when sitting on sara stedy                           ADL                           Toilet  Transfer:  (see comments)   Toileting- Clothing Manipulation and Hygiene: Total assistance;+2 for physical assistance;Sit to/from stand         General ADL Comments: Pt needed to urinate.  Used stedy with mod +2 assist for sit to stand from high bed.  Pt needed max +2 assist from commode to stand using stedy.  Got into w/c and used legs to propel self out to window      Vision                     Perception     Praxis      Cognition   Behavior During Therapy: [facility] for tasks assessed/performed Overall Cognitive Status: Within Functional Limits for tasks assessed                  General Comments: much brighter affect as session progressed and as she got out of room    Extremity/Trunk Assessment               Exercises     Shoulder Instructions       General Comments      Pertinent Vitals/ Pain       Pain Assessment: 0-10 Pain Score: 2  Faces Pain Scale: Hurts a little bit Pain Location: not specified Pain Intervention(s): Limited activity within patient's tolerance;Monitored during  session  Home Living                                          Prior Functioning/Environment              Frequency Min 3X/week     Progress Toward Goals  OT Goals(current goals can now be found in the care plan section)  Progress towards OT goals: Progressing toward goals     Plan      Co-evaluation    PT/OT/SLP Co-Evaluation/Treatment: Yes Reason for Co-Treatment: Complexity of the patient's impairments (multi-system involvement) PT goals addressed during session: Mobility/safety with mobility OT goals addressed during session: ADL's and self-care      End of Session     Activity Tolerance Patient tolerated treatment well   Patient Left  (with sitter and SLP at window)   Nurse Communication          Time: [id number] OT Time Calculation (min): 35 min  Charges: OT General Charges $OT Visit: 1 Procedure OT  Treatments $Self Care/Home Management : 8-22 mins  Lambros Cerro 04/08/2016, 12:59 PM  [name] Bell, OTR/L [phone] 04/08/2016

## 2016-04-08 NOTE — Progress Notes (Signed)
LOS: 59 days   Subjective: Complaining of abdominal pain. Diffuse, 3/10. Pain medicine worsens at first then makes it better. Right foot feels "frozen". Able to move it and has sensation.   Objective: Vital signs in last 24 hours: Temp:  [97.2 F (36.2 C)-99 F (37.2 C)] 98.2 F (36.8 C) (06/01 0534) Pulse Rate:  [80-88] 82 (06/01 0534) Resp:  [16-22] 18 (06/01 0534) BP: (104-117)/(62-73) 106/73 mmHg (06/01 0534) SpO2:  [94 %-99 %] 95 % (06/01 0534) FiO2 (%):  [28 %] 28 % (06/01 0534) Weight:  [97.9 kg (215 lb 13.3 oz)] 97.9 kg (215 lb 13.3 oz) (06/01 0434) Last BM Date: 04/06/16   I/O last 3 completed shifts: In: 1825 [I.V.:865; NG/GT:960] Out: 3590 [Urine:3550; Chest Tube:40] Total I/O In: -  Out: 300 [Urine:300]   CT 20 ml/24 hr @1470  ml  Physical Exam General: alert and cooperative, obese, no acute distress Neck: trach with HTC Cardio: RRR, no G/M/R GI: tender, soft, wound appears to have healthy granulation tissue Extremities: Movement and sensation intact b/l. Distal pulses intact b/l. No swelling or edema.   Assessment/Plan: Jump from hotel/multiple toxic ingestions - on SSRI, sitter for safety.  S/P ex lap, hepatorraphy, SBR, cholecystectomy, preperitoneal pelvic packing [name] 4/3  Ex lap for acidosis/bleeding 4/4 Thompson Ex lap for bleeding 4/5 [name] S/P ex fix pelvis [name] 4/3 S/P ex lap 4/7 Thompson S/P ex lap 4/10 Thompson S/P sacral screws 4/10 [name] S/P application ABRA 4/12 [name]  S/P abd closure, trach 4/24 [name] Resp failure - tolerating trach collar Mult B rib FX/R HPTX, B pulm contusions - IR placed Chest tube drain (03/27/16) for pleural effusion, TCTS following Multiple toxic ingestions Mult L wrist FXs - splint until f/u with Dr. Melvyn Novasrtmann as OP, can WBAT Pelvic FX - S/P ex fix and angioembolization, S/P sacral screw by Dr. Carola FrostHandy. Bed to chair transfers only for another 4-6 weeks, Pivot with to assist with transfers. ABL anemia FEN -  TF Trach now 4 cuffless VTE - SCD's, Lovenox on hold for chest hemorrhage, surveillance dopplers neg 5/27 Dispo [facility]  LOS: 59 days   Tresa EndoKelly Rayburn PA-S  04/08/2016

## 2016-04-08 NOTE — Progress Notes (Signed)
  Plan to re-evaluate clinically and radiographically with xrays on Monday. Possible advancement of some WB with direct PT assistance if positive findings vs continuance for 2 more weeks.  Continue WBAT for xfers at this time.  Myrene GalasMichael Jaggar Benko, MD [facility] [phone] (p)

## 2016-04-09 ENCOUNTER — Inpatient Hospital Stay (HOSPITAL_COMMUNITY): Payer: Medicaid Other

## 2016-04-09 LAB — GLUCOSE, CAPILLARY
GLUCOSE-CAPILLARY: 143 mg/dL — AB (ref 65–99)
Glucose-Capillary: 170 mg/dL — ABNORMAL HIGH (ref 65–99)

## 2016-04-09 MED ORDER — PIVOT 1.5 CAL PO LIQD
1000.0000 mL | ORAL | Status: DC
  Administered 2016-04-10 – 2016-04-11 (×2): 1000 mL
  Filled 2016-04-09 (×4): qty 1000

## 2016-04-09 NOTE — Progress Notes (Signed)
Pt decannulated per order without any complications.  Dressing applied, VSS.  Pt comfortable and smiling at this time.

## 2016-04-09 NOTE — Progress Notes (Signed)
Physical Therapy Treatment Patient Details Name: [name] Bell MRN: [id number] DOB: 10-Mar-1981 Today's Date: 04/09/2016    History of Present Illness [name] Bell is a 35 y.o. female.She is obese. PMH unknown.Pt attempted suicide: taped DNR note to chest, drank Drano, then [place] [place]S/p 02/09/16 gel foam embolization of bil internal iliac arteries.S/p 02/09/16 ex lap with cholecystectomy, resection SB and mesenteric repair, liver lac repair, application or wound vac.S/p 02/09/16 closed reduction of pelvic ring, external pelvic fixation.Also has right metacarpal fracture in splint. Pt on and off intubation via trach/ and on/off trac collar.  Pelvic x- fixator pins removed at bedside on 03/23/16.     PT Comments    Pt performed increased mobility and progressed to standing in RW.  Pt in good spirits and demonstrated smoother transition to stand.    Follow Up Recommendations  CIR     Equipment Recommendations  Wheelchair (measurements PT);Wheelchair cushion (measurements PT);Hospital bed;Other (comment);3in1 (PT)    Recommendations for Other Services Rehab consult     Precautions / Restrictions Precautions Precautions: Fall Precaution Comments: trach feeding tube abdominal binder chest tube Required Braces or Orthoses: Other Brace/Splint Other Brace/Splint: L wrist in splint, abdominal binder Restrictions Weight Bearing Restrictions: No LUE Weight Bearing: Weight bearing as tolerated RLE Weight Bearing: Weight bearing as tolerated LLE Weight Bearing: Weight bearing as tolerated Other Position/Activity Restrictions: WBAT B    Mobility  Bed Mobility Overal bed mobility: Needs Assistance;+2 for physical assistance Bed Mobility: Supine to Sit     Supine to sit: Max assist;+2 for physical assistance     General bed mobility comments: Pt remains to require assistance to move to edge of bed, PTA assisted B LEs then tech and PTA elevated trunk  into sitting followed by further scooting with chux pad.     Transfers Overall transfer level: Needs assistance Equipment used: Rolling walker (2 wheeled) Transfers: Sit to/from Stand Sit to Stand: Mod assist;+2 physical assistance         General transfer comment: Pt performed sit to stand from bed, Pt held to RW while PTA and tech assisted under each arm and use bed pad to stand.  Pt stood with increased speed and smoother transition.  Good quad control noted in standing.  Nurse tech moved bed and Rehab tech able to [facility] to comply with transfer only order.    Ambulation/Gait Ambulation/Gait assistance:  (orders will not allow.  )               Administratortairs            Wheelchair Mobility Wheelchair Mobility Wheelchair mobility: Yes Wheelchair propulsion: Both lower extermities Distance: 300 ft with supervision and intermittent moments of min assist.  Pt required x2 rest breaks due to fatigue.  Performed WC mobility without O2 and on RA with PMV 96%.  Pt required cues for obstacle negotiation and min-supervision for management of lines and leads.  Remains with  IV, chest tube on R and trach.   Wheelchair Assistance Details (indicate cue type and reason): Required assistance at end of trial.  Pt left sitting outside window with sitter present.    Modified Rankin (Stroke Patients Only)       Balance Overall balance assessment: Needs assistance   Sitting balance-[name]: Fair       Standing balance-[name]: Poor  Cognition Arousal/Alertness: Awake/alert Behavior During Therapy: WFL for tasks assessed/performed Overall Cognitive Status: Within Functional Limits for tasks assessed                      Exercises      General Comments        Pertinent Vitals/Pain Pain Assessment: 0-10 Pain Score: 2  Faces Pain Scale: Hurts a little bit Pain Location: not specified.   Pain Descriptors / Indicators:  Aching;Grimacing;Guarding Pain Intervention(s): Limited activity within patient's tolerance;Repositioned    Home Living                      Prior Function            PT Goals (current goals can now be found in the care plan section) Acute Rehab PT Goals Patient Stated Goal: Go home Additional Goals Additional Goal #1: Pt will propel WC >100', supervision for propel, min assist for parts/set up    Frequency  Min 5X/week    PT Plan Current plan remains appropriate    Co-evaluation             End of Session Equipment Utilized During Treatment:  (Gait belt not used due to chest tube site.  ) Activity Tolerance: Patient tolerated treatment well Patient left: in chair;with call bell/phone within reach     Time: 1011-1042 PT Time Calculation (min) (ACUTE ONLY): 31 min  Charges:  $Therapeutic Activity: 8-22 mins $Wheel Chair Management: 8-22 mins                    G Codes:      Florestine Avers 04/18/16, 1:43 PM  Joycelyn Rua, PTA pager [phone]

## 2016-04-09 NOTE — Progress Notes (Signed)
While rounding, CHP was referred to patient by a nurse. The nurse suggested that the patient seemed down and may be interested in prayer. I introduced myself to the patient who was responsive. She requested prayer which I offered in her behalf. I offered follow up visits of which she was receptive. I will follow up as needed.  [name] FearBenjamin T Nacole Fluhr Pager: [phone]     04/09/16 1000  Clinical Encounter Type  Visited With Patient  Visit Type Initial;Spiritual support  Referral From Nurse  Spiritual Encounters  Spiritual Needs Prayer;Emotional

## 2016-04-09 NOTE — Progress Notes (Signed)
LOS: 60 days   Subjective: No complaints. Feeling tired.   Objective: Vital signs in last 24 hours: Temp:  [97.7 F (36.5 C)-99 F (37.2 C)] 97.9 F (36.6 C) (06/02 0742) Pulse Rate:  [79-92] 88 (06/02 0809) Resp:  [18-20] 18 (06/02 0809) BP: (105-119)/(65-76) 113/70 mmHg (06/02 0742) SpO2:  [97 %-100 %] 98 % (06/02 0809) FiO2 (%):  [28 %] 28 % (06/02 0809) Weight:  [98.5 kg (217 lb 2.5 oz)] 98.5 kg (217 lb 2.5 oz) (06/02 0500) Last BM Date: 04/08/16  Imaging Results PORTABLE CHEST 1 VIEW  COMPARISON: 04/05/2016  FINDINGS: Right pleural pigtail catheter, right arm PICC line, tracheostomy tube remain in stable position.  Multiple right-sided rib fractures are again noted. Small right hemothorax shows no significant change in size. No pneumothorax visualized.  Heterogeneous right lung opacity shows no significant change. There is been significant improvement and left mid and lower lung airspace disease since prior study. Improved aeration of both lungs also demonstrated.  IMPRESSION: No significant change in small right hemothorax and heterogeneous right lung airspace disease. No pneumothorax visualized.  Significant decrease in left lung airspace disease since previous study.   Electronically Signed  By: Myles RosenthalJohn Stahl M.D.  On: 04/08/2016 11:30  CT 30 ml/24 hr @1500  ml  Physical Exam General: alert and no distress Neck: trach with HTC Resp: no accessory muscle usage, Cardio: RRR, s1 s2, no M/G/R GI: tender, soft, normal BS   Assessment/Plan: Jump from hotel/multiple toxic ingestions - on SSRI, sitter for safety.  S/P ex lap, hepatorraphy, SBR, cholecystectomy, preperitoneal pelvic packing [name] 4/3  Ex lap for acidosis/bleeding 4/4 Thompson Ex lap for bleeding 4/5 [name] S/P ex fix pelvis [name] 4/3 S/P ex lap 4/7 Thompson S/P ex lap 4/10 Thompson S/P sacral screws 4/10 [name] S/P application ABRA 4/12 [name]  S/P abd closure, trach 4/24  [name] Resp failure - tolerating trach collar Mult B rib FX/R HPTX, B pulm contusions - IR placed Chest tube drain (03/27/16) for pleural effusion, TCTS following Multiple toxic ingestions Mult L wrist FXs - splint until f/u with Dr. Melvyn Novasrtmann as OP, can WBAT Pelvic FX - S/P ex fix and angioembolization, S/P sacral screw by Dr. Carola FrostHandy. Bed to chair transfers only for another 4-6 weeks, Pivot with to assist with transfers. ABL anemia FEN - MBS scheduled for today Trach now 4 cuffless VTE - SCD's, Lovenox on hold for chest hemorrhage, surveillance dopplers neg 5/27 Dispo [facility] LOS: 60 days    Tresa EndoKelly Rayburn PA-S  04/09/2016

## 2016-04-09 NOTE — Progress Notes (Signed)
Stoma appears clean and dry. Pt in no distress at this time. sats 97% via room air.

## 2016-04-09 NOTE — Progress Notes (Signed)
CT surgery  Continued improvement of right lung aeration and reduction in right hemothorax. Pigtail catheter drainage is scant. Recommend removal of pigtail catheter by IR.  Kathlee NationsPeter Van trigt M.D.

## 2016-04-09 NOTE — Progress Notes (Signed)
Nutrition Follow-up  DOCUMENTATION CODES:   Obesity unspecified  INTERVENTION:   -Continue Pivot 1.5 @ 30 ml/hr via PEG  Regimen provides 1080 kcals, 68 grams protein, and 602 ml fluid daily (meets 52% of estimated kcal and protein needs).  -RD will follow for PO intake  NUTRITION DIAGNOSIS:   Increased nutrient needs related to  (trauma) as evidenced by estimated needs.  Ongoing  GOAL:   Patient will meet greater than or equal to 90% of their needs  Progressing  MONITOR:   I & O's, Diet advancement, Labs, TF tolerance  REASON FOR ASSESSMENT:   Consult Enteral/tube feeding initiation and management  ASSESSMENT:   Pt from [place] with no past medical hx admitted after suicide attempt by ingestion of Drano, 2 bottles of acetaminophen, nyquil, and possibly 6 alprazolam pills and jumping from 5th floor balcony of hotel. 4/3 pt is s/p exp lap, hepatorraphy, SBR, cholecystectomy, preperitoneal pelvic packing, ex fix pelvis, R HPTX, Bil pulmonary contusions. Plan for repeat OR 4/5 for another possible SBR. Abdomen remains open.   Pt decannulated earlier this AM.   Pt s/p MBSS this morning; advanced to regular diet with thin liquids.   Case discussed with RN. She reports that pt is tolerating current diet texture well. Per RN, pt did not like menu selections today and is waiting for a family member to bring her outside food for lunch. Pt did consume a smoothie earlier.   TF has been decreased by trauma. Pivot 1.5 is infusing via PEG @ 30 ml/hr. Regimen provides 1080 kcals, 68 grams protein, and 602 ml fluid daily (meets 52% of estimated kcal and protein needs).   Right chest tube in place for R pleural effusion. CTCS recommending removal due to minimal output.   Plans for eventual d/c to inpatient psychiatric hospital.  Labs reviewed: CBGS: 143-170.   Diet Order:  Diet regular Room service appropriate?: Yes; Fluid consistency:: Thin  Skin:  Reviewed, no issues  (incisions)  Last BM:  04/08/16  Height:   Ht Readings from Last 1 Encounters:  03/18/16 5\' 11"  (1.803 m)    Weight:   Wt Readings from Last 1 Encounters:  04/09/16 217 lb 2.5 oz (98.5 kg)    Ideal Body Weight:  70.4 kg  BMI:  Body mass index is 30.3 kg/(m^2).  Estimated Nutritional Needs:   Kcal:  2100-2300  Protein:  130-150 grams  Fluid:  >2 L/day  EDUCATION NEEDS:   No education needs identified at this time  Jabir Dahlem A. Mayford KnifeWilliams, RD, LDN, CDE Pager: [phone] After hours Pager: [phone]

## 2016-04-09 NOTE — Progress Notes (Signed)
Stoma dry and clean at this time. Patient is not in any distress at this time; O2 SATs 96% on room air. RT will continue to monitor.

## 2016-04-09 NOTE — Progress Notes (Signed)
MBSS complete. Full report located under chart review in imaging section. Grabiel Schmutz, MA CCC-SLP [phone]  

## 2016-04-09 NOTE — Clinical Social Work Psych Note (Signed)
LCSW continuing to follow and assist with disposition.  Disposition: Inpatient Psychiatric hospitalization [facility](potentially [facility])  [place] Lake[place]ily Lorilyn Laitinen, [place] [phone] Orthopedics: [phone] Surgical: [phone]

## 2016-04-10 NOTE — Progress Notes (Signed)
40 Days Post-Op  Subjective: Now transferred to 6 N. No new complaints.  Tolerating diet.  Having bowel movements.  No shortness of breath.  Complains of fatigue.  Dr. Donata Clay has approved removal of the right pleural pigtail catheter, and we will request IR do that today.  Objective: Vital signs in last 24 hours: Temp:  [97.9 F (36.6 C)-99.5 F (37.5 C)] 99.5 F (37.5 C) (06/03 0358) Pulse Rate:  [74-89] 89 (06/03 0358) Resp:  [16-19] 19 (06/03 0358) BP: (107-117)/(60-71) 117/63 mmHg (06/03 0358) SpO2:  [95 %-100 %] 100 % (06/03 0358) Weight:  [96.9 kg (213 lb 10 oz)] 96.9 kg (213 lb 10 oz) (06/03 0500) Last BM Date: 04/08/16  Intake/Output from previous day: 06/02 0701 - 06/03 0700 In: 2100.8 [P.O.:360; I.V.:975.8; NG/GT:765] Out: 0  Intake/Output this shift:    General appearance: Alert.  Cooperative.  Not agitated.  Mostly appropriate.  Deconditioned. Neck: , trachea midline,Trach stoma  covered by bandage, clean and dry Resp: clear to auscultation bilaterally.  Small pigtail pleural catheter on right side. GI: Soft.  Nondistended.  Minimally tender.  Wounds clean.  Lab Results:  No results for input(s): WBC, HGB, HCT, PLT in the last 72 hours. BMET No results for input(s): NA, K, CL, CO2, GLUCOSE, BUN, CREATININE, CALCIUM in the last 72 hours. PT/INR No results for input(s): LABPROT, INR in the last 72 hours. ABG No results for input(s): PHART, HCO3 in the last 72 hours.  Invalid input(s): PCO2, PO2  Studies/Results: Dg Chest Port 1 View  04/08/2016  CLINICAL DATA:  Traumatic right hemothorax and pulmonary contusion. EXAM: PORTABLE CHEST 1 VIEW COMPARISON:  04/05/2016 FINDINGS: Right pleural pigtail catheter, right arm PICC line, tracheostomy tube remain in stable position. Multiple right-sided rib fractures are again noted. Small right hemothorax shows no significant change in size. No pneumothorax visualized. Heterogeneous right lung opacity shows no significant  change. There is been significant improvement and left mid and lower lung airspace disease since prior study. Improved aeration of both lungs also demonstrated. IMPRESSION: No significant change in small right hemothorax and heterogeneous right lung airspace disease. No pneumothorax visualized. Significant decrease in left lung airspace disease since previous study. Electronically Signed   By: Myles Rosenthal M.D.   On: 04/08/2016 11:30   Dg Swallowing Func-speech Pathology  04/09/2016  Objective Swallowing Evaluation: Type of Study: MBS-Modified Barium Swallow Study Patient Details Name: [name] Bell MRN: [id number] Date of Birth: Nov 18, 1980 Today's Date: 04/09/2016 Time: SLP Start Time (ACUTE ONLY): 0937-SLP Stop Time (ACUTE ONLY): 1000 SLP Time Calculation (min) (ACUTE ONLY): 23 min Past Medical History: No past medical history on file. Past Surgical History: Past Surgical History Procedure Laterality Date . Esophagogastroduodenoscopy N/A 02/10/2016   Procedure: ESOPHAGOGASTRODUODENOSCOPY (EGD);  Surgeon: Sherrilyn Rist, MD;  Location: [facility] ENDOSCOPY;  Service: Endoscopy;  Laterality: N/A; . Laparotomy N/A 02/10/2016   Procedure: EXPLORATORY LAPAROTOMY, removal of packs,  cauterization of liver, repacking of liver, and open abdomen vac application;  Surgeon: [name], MD;  Location: [facility] OR;  Service: General;  Laterality: N/A; . Laparotomy N/A 02/11/2016   Procedure: EXPLORATORY LAPAROTOMY VAC CHANGE ;  Surgeon: Jimmye Norman, MD;  Location: [facility] OR;  Service: General;  Laterality: N/A; . Chest tube insertion Right 02/11/2016   Procedure: CHEST TUBE INSERTION;  Surgeon: Jimmye Norman, MD;  Location: [facility] OR;  Service: General;  Laterality: Right; . Laparotomy N/A 02/13/2016   Procedure: EXPLORATORY LAPAROTOMY, REMOVAL OF PACKS, ABDOMINAL VAC DRESSING CHANGE;  Surgeon:  [name], MD;  Location: [facility] OR;  Service: General;  Laterality: N/A; . Laparotomy N/A 02/18/2016   Procedure: EXPLORATORY LAPAROTOMY, PLACEMENT OF ABRA  ABDOMINAL WALL CLOSURE SET;  Surgeon: Jimmye Norman, MD;  Location: [facility] OR;  Service: General;  Laterality: N/A; . Chest tube insertion Right 02/26/2016   Procedure: CHEST TUBE INSERTION;  Surgeon: Kerin Perna, MD;  Location: [facility] OR;  Service: Thoracic;  Laterality: Right; . Wound debridement N/A 03/01/2016   Procedure: ABDOMINAL WOUND CLOSURE;  Surgeon: Jimmye Norman, MD;  Location: [facility] OR;  Service: General;  Laterality: N/A; . Percutaneous tracheostomy N/A 03/01/2016   Procedure: PERCUTANEOUS TRACHEOSTOMY;  Surgeon: Jimmye Norman, MD;  Location: [facility] OR;  Service: General;  Laterality: N/A; . Laparotomy N/A 02/09/2016   Procedure: EXPLORATORY LAPAROTOMY;  Surgeon: Jimmye Norman, MD;  Location: [facility] OR;  Service: General;  Laterality: N/A; . Cholecystectomy  02/09/2016   Procedure: CHOLECYSTECTOMY;  Surgeon: Jimmye Norman, MD;  Location: [facility] OR;  Service: General;; . Bowel resection  02/09/2016   Procedure: SMALL BOWEL RESECTION, MESENTERIC REPAIR;  Surgeon: Jimmye Norman, MD;  Location: [facility] OR;  Service: General;; . Application of wound vac  02/09/2016   Procedure: APPLICATION OF WOUND VAC;  Surgeon: Jimmye Norman, MD;  Location: [facility] OR;  Service: General;; . Laceration repair  02/09/2016   Procedure: REPAIR LIVER LACERATION;  Surgeon: Jimmye Norman, MD;  Location: [facility] OR;  Service: General;; . External fixation pelvis  02/09/2016   Procedure: EXTERNAL FIXATION PELVIS;  Surgeon: Myrene Galas, MD;  Location: [facility] OR;  Service: Orthopedics;; . Laparotomy N/A 02/16/2016   Procedure: EXPLORATORY LAPAROTOMY, ABDOMINAL WASH OUT;  Surgeon: [name], MD;  Location: [facility] OR;  Service: General;  Laterality: N/A; . Application of wound vac N/A 02/16/2016   Procedure: RE-APPLICATION OF WOUND VAC;  Surgeon: [name], MD;  Location: [facility] OR;  Service: General;  Laterality: N/A; . Sacro-iliac pinning Right 02/16/2016   Procedure: Loyal Gambler;  Surgeon: Myrene Galas, MD;  Location: [facility] OR;  Service: Orthopedics;  Laterality: Right; HPI: [name] "[name]"  Bell is a 35 y.o. female.PMH unknown.Pt attempted suicide: taped DNR note to chest, drank Drano, then jumped off 5 story building ([address]).S/p 02/09/16 gel foam embolization of bil internal iliac arteries.S/p 02/09/16 ex lap with cholecystectomy, resection SB and mesenteric repair, liver lac repair, application or wound vac.S/p 02/09/16 closed reduction of pelvic ring, external pelvic fixation.Also has right metacarpal fracture in splint. Has #4 cuffless trach but PMSV not available at time of MBS.  Subjective: Pt indicated she wanted to work with speech therapy Assessment / Plan / Recommendation CHL IP CLINICAL IMPRESSIONS 04/09/2016 Therapy Diagnosis -- Clinical Impression Pt demonstrates normal swallow function. Only difference is trace UES residuals that collect in pyriforms post swallow, but quantity is minimal. PMSV not in place during assessment (left in room), but despite this, swallow function WNL. Pt vebalized understanding need for PMSV during all waking hours and definitely during PO intake, sister present for teaching. Pt to initaite a regular diet and thin liquids. SLP will f/u for tolerance.  Impact on safety and function Mild aspiration risk   CHL IP TREATMENT RECOMMENDATION 04/09/2016 Treatment Recommendations Therapy as outlined in treatment plan below   Prognosis 04/09/2016 Prognosis for Safe Diet Advancement Good Barriers to Reach Goals -- Barriers/Prognosis Comment -- CHL IP DIET RECOMMENDATION 04/09/2016 SLP Diet Recommendations Regular solids;Thin liquid Liquid Administration via Cup;Straw Medication Administration Whole meds with liquid Compensations -- Postural Changes Seated upright at 90 degrees   CHL  IP OTHER RECOMMENDATIONS 04/09/2016 Recommended Consults -- Oral Care Recommendations Oral care BID Other Recommendations Place PMSV during PO intake   CHL IP FOLLOW UP RECOMMENDATIONS 04/09/2016 Follow up Recommendations Inpatient Rehab   CHL IP FREQUENCY AND DURATION 04/09/2016 Speech Therapy  Frequency (ACUTE ONLY) min 2x/week Treatment Duration 2 weeks      CHL IP ORAL PHASE 04/09/2016 Oral Phase WFL Oral - Pudding Teaspoon -- Oral - Pudding Cup -- Oral - Honey Teaspoon -- Oral - Honey Cup -- Oral - Nectar Teaspoon -- Oral - Nectar Cup -- Oral - Nectar Straw -- Oral - Thin Teaspoon -- Oral - Thin Cup -- Oral - Thin Straw -- Oral - Puree -- Oral - Mech Soft -- Oral - Regular -- Oral - Multi-Consistency -- Oral - Pill -- Oral Phase - Comment --  CHL IP PHARYNGEAL PHASE 04/09/2016 Pharyngeal Phase WFL Pharyngeal- Pudding Teaspoon -- Pharyngeal -- Pharyngeal- Pudding Cup -- Pharyngeal -- Pharyngeal- Honey Teaspoon -- Pharyngeal -- Pharyngeal- Honey Cup -- Pharyngeal -- Pharyngeal- Nectar Teaspoon -- Pharyngeal -- Pharyngeal- Nectar Cup -- Pharyngeal -- Pharyngeal- Nectar Straw -- Pharyngeal -- Pharyngeal- Thin Teaspoon -- Pharyngeal -- Pharyngeal- Thin Cup -- Pharyngeal -- Pharyngeal- Thin Straw -- Pharyngeal -- Pharyngeal- Puree -- Pharyngeal -- Pharyngeal- Mechanical Soft -- Pharyngeal -- Pharyngeal- Regular -- Pharyngeal -- Pharyngeal- Multi-consistency -- Pharyngeal -- Pharyngeal- Pill -- Pharyngeal -- Pharyngeal Comment --  CHL IP CERVICAL ESOPHAGEAL PHASE 04/09/2016 Cervical Esophageal Phase WFL Pudding Teaspoon -- Pudding Cup -- Honey Teaspoon -- Honey Cup -- Nectar Teaspoon -- Nectar Cup -- Nectar Straw -- Thin Teaspoon -- Thin Cup -- Thin Straw -- Puree -- Mechanical Soft -- Regular -- Multi-consistency -- Pill -- Cervical Esophageal Comment -- No flowsheet data found. Harlon DittyBonnie DeBlois, [place] CCC-SLP [phone] Claudine MoutonDeBlois, Bonnie Caroline 04/09/2016, 10:29 AM               Anti-infectives: Anti-infectives    Start     Dose/Rate Route Frequency Ordered Stop   04/02/16 1200  fluconazole (DIFLUCAN) 40 MG/ML suspension 400 mg     400 mg Per Tube  Once 04/02/16 1026 04/02/16 1442   03/31/16 1000  sulfamethoxazole-trimethoprim (BACTRIM,SEPTRA) 200-40 MG/5ML suspension 40 mL  Status:  Discontinued     40 mL Per  Tube Every 12 hours 03/31/16 0927 03/31/16 0956   03/31/16 1000  sulfamethoxazole-trimethoprim (BACTRIM,SEPTRA) 200-40 MG/5ML suspension 20 mL     20 mL Per Tube Every 6 hours 03/31/16 0956 04/03/16 2359   03/26/16 1030  cefTRIAXone (ROCEPHIN) 2 g in dextrose 5 % 50 mL IVPB  Status:  Discontinued     2 g 100 mL/hr over 30 Minutes Intravenous Every 24 hours 03/26/16 1023 03/31/16 0921   03/25/16 1000  ceFEPIme (MAXIPIME) 2 g in dextrose 5 % 50 mL IVPB  Status:  Discontinued     2 g 100 mL/hr over 30 Minutes Intravenous Every 12 hours 03/25/16 0829 03/26/16 1023   03/08/16 1400  levofloxacin (LEVAQUIN) IVPB 750 mg     750 mg 100 mL/hr over 90 Minutes Intravenous Every 24 hours 03/08/16 0912 03/10/16 1501   03/06/16 1400  ceFEPIme (MAXIPIME) 2 g in dextrose 5 % 50 mL IVPB  Status:  Discontinued     2 g 100 mL/hr over 30 Minutes Intravenous Every 12 hours 03/06/16 1344 03/08/16 0912   03/01/16 1330  ceFAZolin (ANCEF) IVPB 2g/100 mL premix     2 g 200 mL/hr over 30 Minutes Intravenous To ShortStay Surgical 02/29/16 0917 03/01/16  1452   02/11/16 0600  piperacillin-tazobactam (ZOSYN) IVPB 3.375 g  Status:  Discontinued     3.375 g 100 mL/hr over 30 Minutes Intravenous 4 times per day 02/10/16 2214 02/22/16 0853   02/10/16 1730  piperacillin-tazobactam (ZOSYN) IVPB 3.375 g  Status:  Discontinued     3.375 g 12.5 mL/hr over 240 Minutes Intravenous 3 times per day 02/10/16 1727 02/10/16 2214      Assessment/Plan: s/p Procedure(s): ABDOMINAL WOUND CLOSURE PERCUTANEOUS TRACHEOSTOMY  Jump from hotel/multiple toxic ingestions - on SSRI, sitter for safety.  S/P ex lap, hepatorraphy, SBR, cholecystectomy, preperitoneal pelvic packing [name] 4/3  Ex lap for acidosis/bleeding 4/4 [place] Ex lap for bleeding 4/5 [name] S/P ex fix pelvis [name] 4/3 S/P ex lap 4/7 [place] S/P ex lap 4/10 [place] S/P sacral screws 4/10 [name] S/P application ABRA 4/12 [name]  S/P abd closure, trach 4/24  [name] Resp failure - tolerating trach stoma occlusion. Mult B rib FX/R HPTX, B pulm contusions - IR placed Chest tube drain (03/27/16) for pleural effusion, TCTS following.  Have requested that IR remove pigtail catheter from right pleural space today Multiple toxic ingestions Mult L wrist FXs - splint until f/u with Dr. Melvyn Novas as OP, can WBAT Pelvic FX - S/P ex fix and angioembolization, S/P sacral screw by Dr. [name]. Bed to chair transfers only for another 4-6 weeks, Pivot with to assist with transfers. ABL anemia FEN - MBS scheduled for today Trach now 4 cuffless VTE - SCD's, Lovenox on hold for chest hemorrhage, surveillance dopplers neg 5/27 Dispo [facility]   LOS: 61 days    Shilo Philipson M 04/10/2016

## 2016-04-10 NOTE — Progress Notes (Signed)
Patient ID: [name] Bell, female   DOB: 1981/01/20, 35 y.o.   MRN: [id number]    Referring Physician(s): Kathlee Nations Trigt  Supervising Physician: Ruel Favors  Patient Status: inpt  Chief Complaint: Right loculated pleural effusion  Subjective: Patient with no complaints  Allergies: Review of patient's allergies indicates no known allergies.  Medications: Prior to Admission medications   Not on File    Vital Signs: BP 129/76 mmHg  Pulse 89  Temp(Src) 98.8 F (37.1 C) (Oral)  Resp 18  Ht 5\' 11"  (1.803 m)  Wt 213 lb 10 oz (96.9 kg)  BMI 29.81 kg/m2  SpO2 98%  LMP  (LMP Unknown)  Physical Exam: Chest: right chest tube drain removed with no issues.  Site was covered with vaseline gauze and tape.  Patient tolerated well  Imaging: Dg Chest Port 1 View  04/08/2016  CLINICAL DATA:  Traumatic right hemothorax and pulmonary contusion. EXAM: PORTABLE CHEST 1 VIEW COMPARISON:  04/05/2016 FINDINGS: Right pleural pigtail catheter, right arm PICC line, tracheostomy tube remain in stable position. Multiple right-sided rib fractures are again noted. Small right hemothorax shows no significant change in size. No pneumothorax visualized. Heterogeneous right lung opacity shows no significant change. There is been significant improvement and left mid and lower lung airspace disease since prior study. Improved aeration of both lungs also demonstrated. IMPRESSION: No significant change in small right hemothorax and heterogeneous right lung airspace disease. No pneumothorax visualized. Significant decrease in left lung airspace disease since previous study. Electronically Signed   By: Myles Rosenthal M.D.   On: 04/08/2016 11:30   Dg Swallowing Func-speech Pathology  04/09/2016  Objective Swallowing Evaluation: Type of Study: MBS-Modified Barium Swallow Study Patient Details Name: AIKO BELKO MRN: [id number] Date of Birth: 05-18-1981 Today's Date: 04/09/2016 Time: SLP Start Time (ACUTE ONLY): 0937-SLP  Stop Time (ACUTE ONLY): 1000 SLP Time Calculation (min) (ACUTE ONLY): 23 min Past Medical History: No past medical history on file. Past Surgical History: Past Surgical History Procedure Laterality Date . Esophagogastroduodenoscopy N/A 02/10/2016   Procedure: ESOPHAGOGASTRODUODENOSCOPY (EGD);  Surgeon: Sherrilyn Rist, MD;  Location: [facility] ENDOSCOPY;  Service: Endoscopy;  Laterality: N/A; . Laparotomy N/A 02/10/2016   Procedure: EXPLORATORY LAPAROTOMY, removal of packs,  cauterization of liver, repacking of liver, and open abdomen vac application;  Surgeon: [name], MD;  Location: [facility] OR;  Service: General;  Laterality: N/A; . Laparotomy N/A 02/11/2016   Procedure: EXPLORATORY LAPAROTOMY VAC CHANGE ;  Surgeon: Jimmye Norman, MD;  Location: [facility] OR;  Service: General;  Laterality: N/A; . Chest tube insertion Right 02/11/2016   Procedure: CHEST TUBE INSERTION;  Surgeon: Jimmye Norman, MD;  Location: [facility] OR;  Service: General;  Laterality: Right; . Laparotomy N/A 02/13/2016   Procedure: EXPLORATORY LAPAROTOMY, REMOVAL OF PACKS, ABDOMINAL VAC DRESSING CHANGE;  Surgeon: [name], MD;  Location: [facility] OR;  Service: General;  Laterality: N/A; . Laparotomy N/A 02/18/2016   Procedure: EXPLORATORY LAPAROTOMY, PLACEMENT OF ABRA ABDOMINAL WALL CLOSURE SET;  Surgeon: Jimmye Norman, MD;  Location: [facility] OR;  Service: General;  Laterality: N/A; . Chest tube insertion Right 02/26/2016   Procedure: CHEST TUBE INSERTION;  Surgeon: Kerin Perna, MD;  Location: [facility] OR;  Service: Thoracic;  Laterality: Right; . Wound debridement N/A 03/01/2016   Procedure: ABDOMINAL WOUND CLOSURE;  Surgeon: Jimmye Norman, MD;  Location: [facility] OR;  Service: General;  Laterality: N/A; . Percutaneous tracheostomy N/A 03/01/2016   Procedure: PERCUTANEOUS TRACHEOSTOMY;  Surgeon: Jimmye Norman, MD;  Location: [facility] OR;  Service: General;  Laterality: N/A; . Laparotomy N/A 02/09/2016   Procedure: EXPLORATORY LAPAROTOMY;  Surgeon: Jimmye NormanJames [name], MD;  [facility];  Service: General;   Laterality: N/A; . Cholecystectomy  02/09/2016   Procedure: CHOLECYSTECTOMY;  Surgeon: Jimmye NormanJames [name], MD;  [facility];  Service: General;; . Bowel resection  02/09/2016   Procedure: SMALL BOWEL RESECTION, MESENTERIC REPAIR;  Surgeon: Jimmye NormanJames [name], MD;  [facility];  Service: General;; . Application of wound vac  02/09/2016   Procedure: APPLICATION OF WOUND VAC;  Surgeon: Jimmye NormanJames [name], MD;  [facility];  Service: General;; . Laceration repair  02/09/2016   Procedure: REPAIR LIVER LACERATION;  Surgeon: Jimmye NormanJames [name], MD;  [facility];  Service: General;; . External fixation pelvis  02/09/2016   Procedure: EXTERNAL FIXATION PELVIS;  Surgeon: Myrene GalasMichael [name], MD;  [facility];  Service: Orthopedics;; . Laparotomy N/A 02/16/2016   Procedure: EXPLORATORY LAPAROTOMY, ABDOMINAL WASH OUT;  Surgeon: [name] Thompson, MD;  Location: [facility] OR;  Service: General;  Laterality: N/A; . Application of wound vac N/A 02/16/2016   Procedure: RE-APPLICATION OF WOUND VAC;  Surgeon: [name] Thompson, MD;  Location: [facility] OR;  Service: General;  Laterality: N/A; . Sacro-iliac pinning Right 02/16/2016   Procedure: Loyal GamblerSACRO-ILIAC PINNING;  Surgeon: Myrene GalasMichael [name], MD;  [facility];  Service: Orthopedics;  Laterality: Right; HPI: [name] MawKatia "[name]" [name] is a 35 y.o. female.PMH unknown.Pt attempted suicide: taped DNR note to chest, drank Drano, then jumped off 5 story building ([address]S/p 02/09/16 gel foam embolization of bil internal iliac arteries.S/p 02/09/16 ex lap with cholecystectomy, resection SB and mesenteric repair, liver lac repair, application or wound vac.S/p 02/09/16 closed reduction of pelvic ring, external pelvic fixation.Also has right metacarpal fracture in splint. Has #4 cuffless trach but PMSV not available at time of MBS.  Subjective: Pt indicated she wanted to work with speech therapy Assessment / Plan / Recommendation CHL IP CLINICAL IMPRESSIONS 04/09/2016 Therapy Diagnosis -- Clinical Impression Pt demonstrates normal swallow  function. Only difference is trace UES residuals that collect in pyriforms post swallow, but quantity is minimal. PMSV not in place during assessment (left in room), but despite this, swallow function WNL. Pt vebalized understanding need for PMSV during all waking hours and definitely during PO intake, sister present for teaching. Pt to initaite a regular diet and thin liquids. SLP will f/u for tolerance.  Impact on safety and function Mild aspiration risk   CHL IP TREATMENT RECOMMENDATION 04/09/2016 Treatment Recommendations Therapy as outlined in treatment plan below   Prognosis 04/09/2016 Prognosis for Safe Diet Advancement Good Barriers to Reach Goals -- Barriers/Prognosis Comment -- CHL IP DIET RECOMMENDATION 04/09/2016 SLP Diet Recommendations Regular solids;Thin liquid Liquid Administration via Cup;Straw Medication Administration Whole meds with liquid Compensations -- Postural Changes Seated upright at 90 degrees   CHL IP OTHER RECOMMENDATIONS 04/09/2016 Recommended Consults -- Oral Care Recommendations Oral care BID Other Recommendations Place PMSV during PO intake   CHL IP FOLLOW UP RECOMMENDATIONS 04/09/2016 Follow up Recommendations Inpatient Rehab   CHL IP FREQUENCY AND DURATION 04/09/2016 Speech Therapy Frequency (ACUTE ONLY) min 2x/week Treatment Duration 2 weeks      CHL IP ORAL PHASE 04/09/2016 Oral Phase WFL Oral - Pudding Teaspoon -- Oral - Pudding Cup -- Oral - Honey Teaspoon -- Oral - Honey Cup -- Oral - Nectar Teaspoon -- Oral - Nectar Cup -- Oral - Nectar Straw -- Oral - Thin Teaspoon -- Oral - Thin Cup -- Oral - Thin Straw -- Oral - Puree --  Oral - Mech Soft -- Oral - Regular -- Oral - Multi-Consistency -- Oral - Pill -- Oral Phase - Comment --  CHL IP PHARYNGEAL PHASE 04/09/2016 Pharyngeal Phase WFL Pharyngeal- Pudding Teaspoon -- Pharyngeal -- Pharyngeal- Pudding Cup -- Pharyngeal -- Pharyngeal- Honey Teaspoon -- Pharyngeal -- Pharyngeal- Honey Cup -- Pharyngeal -- Pharyngeal- Nectar Teaspoon -- Pharyngeal  -- Pharyngeal- Nectar Cup -- Pharyngeal -- Pharyngeal- Nectar Straw -- Pharyngeal -- Pharyngeal- Thin Teaspoon -- Pharyngeal -- Pharyngeal- Thin Cup -- Pharyngeal -- Pharyngeal- Thin Straw -- Pharyngeal -- Pharyngeal- Puree -- Pharyngeal -- Pharyngeal- Mechanical Soft -- Pharyngeal -- Pharyngeal- Regular -- Pharyngeal -- Pharyngeal- Multi-consistency -- Pharyngeal -- Pharyngeal- Pill -- Pharyngeal -- Pharyngeal Comment --  CHL IP CERVICAL ESOPHAGEAL PHASE 04/09/2016 Cervical Esophageal Phase WFL Pudding Teaspoon -- Pudding Cup -- Honey Teaspoon -- Honey Cup -- Nectar Teaspoon -- Nectar Cup -- Nectar Straw -- Thin Teaspoon -- Thin Cup -- Thin Straw -- Puree -- Mechanical Soft -- Regular -- Multi-consistency -- Pill -- Cervical Esophageal Comment -- No flowsheet data found. Harlon Ditty, MA CCC-SLP [phone] Claudine Mouton 04/09/2016, 10:29 AM               Labs:  CBC:  Recent Labs  03/27/16 0600 03/29/16 0445 03/30/16 0605 04/05/16 0415  WBC 8.0 7.0 8.2 10.3  HGB 6.6* 7.7* 8.5* 9.8*  HCT 22.5* 25.6* 28.5* 32.0*  PLT 153 226 261 430*    COAGS:  Recent Labs  02/09/16 1040 02/09/16 1430  02/13/16 0600 02/13/16 1620 02/23/16 1926 02/24/16 0415 03/27/16 0445  INR 2.11* 1.99*  < > 2.04* 1.75*  --  1.47 1.52*  APTT 80* 63*  --  36  --  37  --   --   < > = values in this interval not displayed.  BMP:  Recent Labs  03/27/16 0445 03/30/16 0605 04/03/16 0548 04/05/16 0415  NA 139 140 133* 136  K 3.7 3.3* 4.0 4.2  CL 100* 101 97* 98*  CO2 30 35* 31 32  GLUCOSE 104* 126* 107* 132*  BUN 29* CALCIUM 8.4* 8.2* 8.3* 8.7*  CREATININE 0.53 0.31* 0.40* 0.48  GFRNONAA >60 >60 >60 >60  GFRAA >60 >60 >60 >60    LIVER FUNCTION TESTS:  Recent Labs  03/11/16 0415 03/18/16 0423 03/26/16 0346 04/05/16 0415  BILITOT 4.8* 3.1* 3.0* 1.7*  AST 70* 37 37 39  ALT 43 33 28 35  ALKPHOS 115 137* 139* 160*  PROT 5.7* 5.8* 5.7* 7.2  ALBUMIN 1.2* 1.4* 1.3* 1.9*     Assessment and Plan: 1. S/p (R) CT drain placement, 5/20 -drain removed -further care per primary service  Electronically Signed: Kippy Melena E 04/10/2016, 10:25 AM   I spent a total of 15 Minutes at the the patient's bedside AND on the patient's hospital floor or unit, greater than 50% of which was counseling/coordinating care for right loculated pleural effusion

## 2016-04-11 NOTE — Progress Notes (Signed)
41 Days Post-Op  Subjective: She looks quite good.  Speech is clear, she is calm and seems comfortable, very much oriented and aware of circumstances.  Janina Mayorach site looks good, tolerating regular diet, although she is not happy with food.  Open sites all look good.    Objective: Vital signs in last 24 hours: Temp:  [98.1 F (36.7 C)-98.8 F (37.1 C)] 98.5 F (36.9 C) (06/04 0105) Pulse Rate:  [87-88] 87 (06/04 0105) Resp:  [18-19] 18 (06/04 0105) BP: (118-125)/(68-82) 119/68 mmHg (06/04 0105) SpO2:  [96 %-98 %] 97 % (06/04 0105) Weight:  [94.8 kg (208 lb 15.9 oz)] 94.8 kg (208 lb 15.9 oz) (06/04 0500) Last BM Date: 04/10/16 240 PO Voided x 6 BM x 3 Afebrile, VSS Glucose fair control No other labs since 04/04/16 Intake/Output from previous day: 06/03 0701 - 06/04 0700 In: 198 [P.O.:198] Out: -  Intake/Output this shift: Total I/O In: 240 [P.O.:240] Out: -   General appearance: alert, cooperative and no distress Resp: clear to auscultation bilaterally GI: soft, non-tender; bowel sounds normal; no masses,  no organomegaly and open wound looks very good. neuro:  intact,  Lab Results:  No results for input(s): WBC, HGB, HCT, PLT in the last 72 hours.  BMET No results for input(s): NA, K, CL, CO2, GLUCOSE, BUN, CREATININE, CALCIUM in the last 72 hours. PT/INR No results for input(s): LABPROT, INR in the last 72 hours.   Recent Labs Lab 04/05/16 0415  AST 39  ALT 35  ALKPHOS 160*  BILITOT 1.7*  PROT 7.2  ALBUMIN 1.9*     Lipase  No results found for: LIPASE   Studies/Results: No results found.  Medications: . antiseptic oral rinse  7 mL Mouth Rinse q12n4p  . chlorhexidine  15 mL Mouth Rinse BID  . clonazePAM  0.5 mg Per Tube TID  . escitalopram  10 mg Per Tube Daily  . ferrous sulfate  300 mg Per Tube BID WC  . furosemide  40 mg Oral Daily  . guaiFENesin  15 mL Per Tube Q6H  . pantoprazole sodium  40 mg Per Tube Daily  . potassium chloride  40 mEq Oral BID   . QUEtiapine  25 mg Per Tube BID  . sertraline  100 mg Oral Daily  . sodium chloride flush  10-40 mL Intracatheter Q12H    Assessment/Plan s/p Procedure(s): ABDOMINAL WOUND CLOSURE PERCUTANEOUS TRACHEOSTOMY  Jump from hotel/multiple toxic ingestions - on SSRI, sitter for safety.  S/P ex lap, hepatorraphy, SBR, cholecystectomy, preperitoneal pelvic packing [name] 4/3  Ex lap for acidosis/bleeding 4/4 Thompson Ex lap for bleeding 4/5 [name] S/P ex fix pelvis [name] 4/3 S/P ex lap 4/7 Thompson S/P ex lap 4/10 Thompson S/P sacral screws 4/10 [name] S/P application ABRA 4/12 [name]  S/P abd closure, trach 4/24 [name] Resp failure - tolerating trach stoma occlusion. Mult B rib FX/R HPTX, B pulm contusions - IR placed Chest tube drain (03/27/16) for pleural effusion, TCTS following.Pigtail drain placed 5/20 and removed 04/10/16 Multiple toxic ingestions Mult L wrist FXs - splint until f/u with Dr. Melvyn Novasrtmann as OP, can WBAT Pelvic FX - S/P ex fix and angioembolization, S/P sacral screw by Dr. Carola FrostHandy. Bed to chair transfers only for another 4-6 weeks, Pivot with to assist with transfers. ABL anemia FEN - Regular diet  Trach now 4 cuffless VTE - SCD's, Lovenox on hold for chest hemorrhage, surveillance dopplers neg 5/27 Dispo [facility]       LOS: 62 days  Sherrie George 04/11/2016 [phone]

## 2016-04-12 ENCOUNTER — Inpatient Hospital Stay (HOSPITAL_COMMUNITY): Payer: Medicaid Other

## 2016-04-12 DIAGNOSIS — F332 Major depressive disorder, recurrent severe without psychotic features: Secondary | ICD-10-CM | POA: Diagnosis present

## 2016-04-12 LAB — CBC
HEMATOCRIT: 35 % — AB (ref 36.0–46.0)
Hemoglobin: 10.8 g/dL — ABNORMAL LOW (ref 12.0–15.0)
MCH: 28.1 pg (ref 26.0–34.0)
MCHC: 30.9 g/dL (ref 30.0–36.0)
MCV: 90.9 fL (ref 78.0–100.0)
PLATELETS: 288 10*3/uL (ref 150–400)
RBC: 3.85 MIL/uL — AB (ref 3.87–5.11)
RDW: 15.6 % — ABNORMAL HIGH (ref 11.5–15.5)
WBC: 15.3 10*3/uL — AB (ref 4.0–10.5)

## 2016-04-12 LAB — COMPREHENSIVE METABOLIC PANEL
ALT: 21 U/L (ref 14–54)
AST: 25 U/L (ref 15–41)
Albumin: 2.5 g/dL — ABNORMAL LOW (ref 3.5–5.0)
Alkaline Phosphatase: 126 U/L (ref 38–126)
Anion gap: 10 (ref 5–15)
BILIRUBIN TOTAL: 1.8 mg/dL — AB (ref 0.3–1.2)
BUN: 15 mg/dL (ref 6–20)
CALCIUM: 9.2 mg/dL (ref 8.9–10.3)
CO2: 29 mmol/L (ref 22–32)
CREATININE: 0.44 mg/dL (ref 0.44–1.00)
Chloride: 95 mmol/L — ABNORMAL LOW (ref 101–111)
Glucose, Bld: 114 mg/dL — ABNORMAL HIGH (ref 65–99)
Potassium: 3.7 mmol/L (ref 3.5–5.1)
Sodium: 134 mmol/L — ABNORMAL LOW (ref 135–145)
TOTAL PROTEIN: 7.2 g/dL (ref 6.5–8.1)

## 2016-04-12 MED ORDER — ENOXAPARIN SODIUM 30 MG/0.3ML ~~LOC~~ SOLN
30.0000 mg | Freq: Two times a day (BID) | SUBCUTANEOUS | Status: DC
  Administered 2016-04-12 – 2016-04-24 (×21): 30 mg via SUBCUTANEOUS
  Filled 2016-04-12 (×27): qty 0.3

## 2016-04-12 MED ORDER — CLONAZEPAM 0.5 MG PO TABS
0.5000 mg | ORAL_TABLET | Freq: Two times a day (BID) | ORAL | Status: DC
  Administered 2016-04-12 (×2): 0.5 mg via ORAL
  Filled 2016-04-12: qty 1

## 2016-04-12 MED ORDER — OXYCODONE HCL 5 MG PO TABS
10.0000 mg | ORAL_TABLET | ORAL | Status: DC | PRN
  Administered 2016-04-12 – 2016-04-18 (×16): 20 mg via ORAL
  Administered 2016-04-26 – 2016-04-28 (×3): 10 mg via ORAL
  Filled 2016-04-12 (×2): qty 4
  Filled 2016-04-12: qty 2
  Filled 2016-04-12: qty 4
  Filled 2016-04-12: qty 2
  Filled 2016-04-12 (×2): qty 4
  Filled 2016-04-12: qty 2
  Filled 2016-04-12: qty 4
  Filled 2016-04-12 (×2): qty 2
  Filled 2016-04-12 (×5): qty 4
  Filled 2016-04-12: qty 2
  Filled 2016-04-12 (×5): qty 4

## 2016-04-12 MED ORDER — QUETIAPINE FUMARATE 50 MG PO TABS
25.0000 mg | ORAL_TABLET | Freq: Every day | ORAL | Status: DC
  Administered 2016-04-12: 25 mg via ORAL
  Filled 2016-04-12: qty 1

## 2016-04-12 MED ORDER — ENSURE ENLIVE PO LIQD
237.0000 mL | Freq: Three times a day (TID) | ORAL | Status: DC
  Administered 2016-04-13 – 2016-04-18 (×8): 237 mL via ORAL

## 2016-04-12 MED ORDER — FERROUS SULFATE 300 (60 FE) MG/5ML PO SYRP
300.0000 mg | ORAL_SOLUTION | Freq: Two times a day (BID) | ORAL | Status: DC
  Administered 2016-04-12: 300 mg via ORAL
  Filled 2016-04-12 (×8): qty 5

## 2016-04-12 MED ORDER — ESCITALOPRAM OXALATE 10 MG PO TABS: 10.0000 mg | ORAL_TABLET | Freq: Every day | ORAL | Status: DC

## 2016-04-12 NOTE — Clinical Social Work Psych Note (Addendum)
1:08pm- Psych CS[facility]'s [facility], trach, chest tube and g-tube.  This information was in the clinical documentation sent previously.  Psych CSW [place] ([facility] though documentation: trach and chest tube removed and diet advanced/tube feedings dc'd.  [facility] requested 5 days of progress notes. Psych CSW faxed per request.  [facility].  9:31am- Psych CSW received notification that patient's trach and chest tube was removed on Friday.   Covering [facility]  Psych CSW contacted [facility] this morning and patient is currently under review for possible wait list admission.  Vickii PennaGina Jaivyn Gulla, LCSW [phone]  [address] [facility] Psych[facility] Service Line Licensed Clinical Social Worker

## 2016-04-12 NOTE — Progress Notes (Signed)
Physical Therapy Treatment Patient Details Name: [name] Bell MRN: [id number] DOB: 03-13-1981 Today's Date: 04/12/2016    History of Present Illness [name] Bell is a 35 y.o. female.She is obese. PMH unknown.Pt attempted suicide: taped DNR note to chest, drank Drano, then jumped off 5 story building ([address]).S/p 02/09/16 gel foam embolization of bil internal iliac arteries.S/p 02/09/16 ex lap with cholecystectomy, resection SB and mesenteric repair, liver lac repair, application or wound vac.S/p 02/09/16 closed reduction of pelvic ring, external pelvic fixation.Also has right metacarpal fracture in splint. Pt on and off intubation via trach/ and on/off trac collar.  Pelvic x- fixator pins removed at bedside on 03/23/16.     PT Comments    Pt continues to progress with therapy but limited during todays session, secondary to pain.  Pt to receive xrays to pelvis and B thighs, MD reports okay to continue with therapy at this time.  Pt decreased WC mobility to 40 ft during intervention remains to c/o pain and DOE.  SPO2 94% on RA.    Follow Up Recommendations  CIR     Equipment Recommendations  Wheelchair (measurements PT);Wheelchair cushion (measurements PT);Hospital bed;Other (comment);3in1 (PT)    Recommendations for Other Services Rehab consult     Precautions / Restrictions Precautions Precautions: Fall Precaution Comments: trach feeding tube abdominal binder chest tube Required Braces or Orthoses: Other Brace/Splint Other Brace/Splint: L wrist in splint, abdominal binder Restrictions Weight Bearing Restrictions: No LUE Weight Bearing: Weight bearing as tolerated RLE Weight Bearing: Weight bearing as tolerated LLE Weight Bearing: Weight bearing as tolerated Other Position/Activity Restrictions: WBAT B    Mobility  Bed Mobility Overal bed mobility: Needs Assistance;+2 for physical assistance Bed Mobility: Supine to Sit     Supine to sit: Max assist;+2 for  physical assistance     General bed mobility comments: Pt remains to require assistance to move to edge of bed, PTA assisted B LEs then tech and PTA elevated trunk into sitting followed by further scooting with chux pad.     Transfers Overall transfer level: Needs assistance Equipment used: Rolling walker (2 wheeled) Transfers: Sit to/from Stand Sit to Stand: Mod assist;+2 physical assistance         General transfer comment: Pt performed sit to stand from bed, Pt held to RW while PTA and tech assisted under each arm and use bed pad to stand.  Pt stood with increased speed and smoother transition.  Good quad control noted in standing.  Nurse tech moved bed and Rehab tech [facility] to comply with transfer only order.    Ambulation/Gait Ambulation/Gait assistance:  (remains with orders for bed to chair transfers only.)               Stairs            Wheelchair Mobility    Modified Rankin (Stroke Patients Only)       Balance Overall balance assessment: Needs assistance   Sitting balance-[name]: Fair       Standing balance-[name]: Poor                      Cognition Arousal/Alertness: Awake/alert Behavior During Therapy: WFL for tasks assessed/performed Overall Cognitive Status: Within Functional Limits for tasks assessed                      Exercises      General Comments  Pertinent Vitals/Pain Pain Assessment: 0-10 Pain Score: 8  Pain Descriptors / Indicators: Crying;Grimacing;Guarding;Discomfort;Tender;Sore Pain Intervention(s): Monitored during session;Repositioned    Home Living                      Prior Function            PT Goals (current goals can now be found in the care plan section) Acute Rehab PT Goals Patient Stated Goal: Go home PT Goal Formulation: Patient unable to participate in goal setting Potential to Achieve Goals: Good Additional Goals Additional Goal  #1: Pt will propel WC >100', supervision for propel, min assist for parts/set up Progress towards PT goals: Progressing toward goals    Frequency  Min 5X/week    PT Plan Current plan remains appropriate    Co-evaluation             End of Session Equipment Utilized During Treatment:  (gt belt not used due to abdominal incision and chest tube site soreness.  ) Activity Tolerance: Patient tolerated treatment well Patient left: in chair;with call bell/phone within reach     Time: 1410-1431 PT Time Calculation (min) (ACUTE ONLY): 21 min  Charges:  $Therapeutic Activity: 8-22 mins                    G Codes:      Florestine Avers 04-15-2016, 2:39 PM  Joycelyn Rua, PTA pager [phone]

## 2016-04-12 NOTE — Consult Note (Signed)
[place] Psychiatry Consult   Reason for Consult:  Suicide attempt Referring Physician:  PCP Patient Identification: [name] Bell MRN:  [id number] Principal Diagnosis: Major depressive disorder, recurrent severe without psychotic features [facility]) Diagnosis:   Patient Active Problem List   Diagnosis Date Noted  . Major depressive disorder, recurrent severe without psychotic features ([place]) [F33.2] 04/12/2016    Priority: High  . Multiple pelvic fractures ([place]) [S32.810A] 03/03/2016  . Left wrist fracture [S62.102A] 03/03/2016  . Multiple fractures of ribs of right side [S22.41XA] 03/03/2016  . Fracture of multiple ribs of left side [S22.42XA] 03/03/2016  . Liver laceration [S36.113A] 03/03/2016  . Small intestine injury [S36.409A] 03/03/2016  . Acute blood loss anemia [D62] 03/03/2016  . Suicide attempt ([place]) [T14.91] 03/03/2016  . Bilateral pulmonary contusion [S27.322A] 03/03/2016  . Acute respiratory failure ([place]) [J96.00] 03/03/2016  . Hypokalemia [E87.6] 03/03/2016  . Hypernatremia [E87.0] 03/03/2016  . Acute kidney injury ([place]) [N17.9] 03/03/2016  . Ingestion of caustic substance [T54.91XA] 03/03/2016  . Tylenol overdose [T39.1X4A] 03/03/2016  . Hyperglycemia [R73.9] 03/03/2016  . Hepatic failure ([place]) [K72.90] 03/03/2016  . Pressure ulcer [L89.90] 02/26/2016  . Fall from, out of or through building, not otherwise specified, initial encounter [W13.9XXA] 02/09/2016  . Hypovolemic shock ([place]) [R57.1] 02/09/2016    Total Time spent with patient: 30 minutes  Subjective:   [name] Bell is a 35 y.o. female patient admitted with suicide attempt who needs a medical/surgical psychiatric unit, preferably at [facility] due to the severity of her suicide attempt.  Dr. Dwyane Dee has reviewed this patient and concurs with this disposition.    HPI:  35 yo female who presented to the ED after intentionally jumping off the fifth floor of a hotel.  She reports she had been feeling "very  overwhelmed with emotional pain episode, watching friends and family going through stuff" and felt hopeless she could not motivate her friends and family to do their best.  She is glad her attempt was not successful and happy it motivated her family and friends to fulfill their potentials.  When asked what she is living for, she said her family and smiled.  She does feel her medications are working but is still depressed with flat affect.  Denies homicidal ideations and hallucinations.  Patient is awaiting [facility] med/surg placement.  Past Psychiatric History: denies   Risk to Self: Is patient at risk for suicide?: Yes Risk to Others:  no Prior Inpatient Therapy:  denies Prior Outpatient Therapy:  denies  Past Medical History: History reviewed. No pertinent past medical history.  Past Surgical History  Procedure Laterality Date  . Esophagogastroduodenoscopy N/A 02/10/2016    Procedure: ESOPHAGOGASTRODUODENOSCOPY (EGD);  Surgeon: Doran Stabler, MD;  Location: [facility] ENDOSCOPY;  Service: Endoscopy;  Laterality: N/A;  . Laparotomy N/A 02/10/2016    Procedure: EXPLORATORY LAPAROTOMY, removal of packs,  cauterization of liver, repacking of liver, and open abdomen vac application;  Surgeon: Georganna Skeans, MD;  Location: [place];  Service: General;  Laterality: N/A;  . Laparotomy N/A 02/11/2016    Procedure: EXPLORATORY LAPAROTOMY VAC CHANGE ;  Surgeon: Judeth Horn, MD;  Location: [place];  Service: General;  Laterality: N/A;  . Chest tube insertion Right 02/11/2016    Procedure: CHEST TUBE INSERTION;  Surgeon: Judeth Horn, MD;  Location: [place];  Service: General;  Laterality: Right;  . Laparotomy N/A 02/13/2016    Procedure: EXPLORATORY LAPAROTOMY, REMOVAL OF PACKS, ABDOMINAL VAC DRESSING CHANGE;  Surgeon: Georganna Skeans, MD;  Location: [facility]  OR;  Service: General;  Laterality: N/A;  . Laparotomy N/A 02/18/2016    Procedure: EXPLORATORY LAPAROTOMY, PLACEMENT OF ABRA ABDOMINAL WALL CLOSURE SET;  Surgeon: Judeth Horn, MD;   Location: [place];  Service: General;  Laterality: N/A;  . Chest tube insertion Right 02/26/2016    Procedure: CHEST TUBE INSERTION;  Surgeon: Ivin Poot, MD;  Location: [place];  Service: Thoracic;  Laterality: Right;  . Wound debridement N/A 03/01/2016    Procedure: ABDOMINAL WOUND CLOSURE;  Surgeon: Judeth Horn, MD;  Location: [place];  Service: General;  Laterality: N/A;  . Percutaneous tracheostomy N/A 03/01/2016    Procedure: PERCUTANEOUS TRACHEOSTOMY;  Surgeon: Judeth Horn, MD;  Location: [place];  Service: General;  Laterality: N/A;  . Laparotomy N/A 02/09/2016    Procedure: EXPLORATORY LAPAROTOMY;  Surgeon: Judeth Horn, MD;  Location: [place];  Service: General;  Laterality: N/A;  . Cholecystectomy  02/09/2016    Procedure: CHOLECYSTECTOMY;  Surgeon: Judeth Horn, MD;  Location: [place];  Service: General;;  . Bowel resection  02/09/2016    Procedure: SMALL BOWEL RESECTION, MESENTERIC REPAIR;  Surgeon: Judeth Horn, MD;  Location: [place];  Service: General;;  . Application of wound vac  02/09/2016    Procedure: APPLICATION OF WOUND VAC;  Surgeon: Judeth Horn, MD;  Location: [place];  Service: General;;  . Laceration repair  02/09/2016    Procedure: REPAIR LIVER LACERATION;  Surgeon: Judeth Horn, MD;  Location: [place];  Service: General;;  . External fixation pelvis  02/09/2016    Procedure: EXTERNAL FIXATION PELVIS;  Surgeon: Altamese [place], MD;  Location: [place];  Service: Orthopedics;;  . Laparotomy N/A 02/16/2016    Procedure: EXPLORATORY LAPAROTOMY, ABDOMINAL [place];  Surgeon: Georganna Skeans, MD;  Location: [place];  Service: General;  Laterality: N/A;  . Application of wound vac N/A 02/16/2016    Procedure: RE-APPLICATION OF WOUND VAC;  Surgeon: Georganna Skeans, MD;  Location: [place];  Service: General;  Laterality: N/A;  . Sacro-iliac pinning Right 02/16/2016    Procedure: [place];  Surgeon: Altamese Glou[place], MD;  Location: [place];  Service: Orthopedics;  Laterality: Right;   Family History: History  reviewed. No pertinent family history. Family Psychiatric  History: Mother and sister with anxiety Social History:  History  Alcohol Use: Not on file    Comment: unknown     History  Drug Use Not on file    Social History   Social History  . Marital Status: Unknown    Spouse Name: N/A  . Number of Children: N/A  . Years of Education: N/A   Social History Main Topics  . Smoking status: Never Smoker   . Smokeless tobacco: None  . Alcohol Use: None     Comment: unknown  . Drug Use: None  . Sexual Activity: Not Asked   Other Topics Concern  . None   Social History Narrative   Additional Social History:    Allergies:  No Known Allergies  Labs:  Results for orders placed or performed during the hospital encounter of 02/09/16 (from the past 48 hour(s))  CBC     Status: Abnormal   Collection Time: 04/12/16  5:23 AM  Result Value Ref Range   WBC 15.3 (H) 4.0 - 10.5 K/uL   RBC 3.85 (L) 3.87 - 5.11 MIL/uL   Hemoglobin 10.8 (L) 12.0 - 15.0 g/dL   HCT 35.0 (L) 36.0 - 46.0 %   MCV 90.9 78.0 - 100.0 fL   MCH 28.1  26.0 - 34.0 pg   MCHC 30.9 30.0 - 36.0 g/dL   RDW 15.6 (H) 11.5 - 15.5 %   Platelets 288 150 - 400 K/uL  Comprehensive metabolic panel     Status: Abnormal   Collection Time: 04/12/16  5:23 AM  Result Value Ref Range   Sodium 134 (L) 135 - 145 mmol/L   Potassium 3.7 3.5 - 5.1 mmol/L   Chloride 95 (L) 101 - 111 mmol/L   CO2 29 22 - 32 mmol/L   Glucose, Bld 114 (H) 65 - 99 mg/dL   BUN 15 6 - 20 mg/dL   Creatinine, Ser 0.44 0.44 - 1.00 mg/dL   Calcium 9.2 8.9 - 10.3 mg/dL   Total Protein 7.2 6.5 - 8.1 g/dL   Albumin 2.5 (L) 3.5 - 5.0 g/dL   AST 25 15 - 41 U/L   ALT 21 14 - 54 U/L   Alkaline Phosphatase 126 38 - 126 U/L   Total Bilirubin 1.8 (H) 0.3 - 1.2 mg/dL   GFR calc non Af Amer >60 >60 mL/min   GFR calc Af Amer >60 >60 mL/min    Comment: (NOTE) The eGFR has been calculated using the CKD EPI equation. This calculation has not been validated in all  clinical situations. eGFR's persistently <60 mL/min signify possible Chronic Kidney Disease.    Anion gap 10 5 - 15    Current Facility-Administered Medications  Medication Dose Route Frequency Provider Last Rate Last Dose  . acetaminophen (TYLENOL) solution 650 mg  650 mg Oral Q8H PRN Judeth Horn, MD   650 mg at 03/25/16 1953  . antiseptic oral rinse (CPC / CETYLPYRIDINIUM CHLORIDE 0.05%) solution 7 mL  7 mL Mouth Rinse q12n4p Judeth Horn, MD   7 mL at 04/12/16 1600  . bisacodyl (DULCOLAX) suppository 10 mg  10 mg Rectal Daily PRN Judeth Horn, MD   10 mg at 04/04/16 1232  . chlorhexidine (PERIDEX) 0.12 % solution 15 mL  15 mL Mouth Rinse BID Judeth Horn, MD   15 mL at 04/12/16 2127  . clonazePAM (KLONOPIN) tablet 0.5 mg  0.5 mg Oral BID Lisette Abu, PA-C   0.5 mg at 04/12/16 2128  . enoxaparin (LOVENOX) injection 30 mg  30 mg Subcutaneous Q12H Lisette Abu, PA-C   30 mg at 04/12/16 2128  . feeding supplement (ENSURE ENLIVE) (ENSURE ENLIVE) liquid 237 mL  237 mL Oral TID BM Georganna Skeans, MD   237 mL at 04/12/16 2000  . ferrous sulfate 300 (60 Fe) MG/5ML syrup 300 mg  300 mg Oral BID WC Lisette Abu, PA-C   300 mg at 04/12/16 1706  . HYDROmorphone (DILAUDID) injection 0.5 mg  0.5 mg Intravenous Q4H PRN Lisette Abu, PA-C   0.5 mg at 04/12/16 1287  . ipratropium-albuterol (DUONEB) 0.5-2.5 (3) MG/3ML nebulizer solution 3 mL  3 mL Nebulization Q6H PRN Judeth Horn, MD   3 mL at 04/03/16 2354  . LORazepam (ATIVAN) injection 1-2 mg  1-2 mg Intravenous Q4H PRN Georganna Skeans, MD   2 mg at 04/06/16 0556  . ondansetron (ZOFRAN) tablet 4 mg  4 mg Oral Q6H PRN Lisette Abu, PA-C       Or  . ondansetron [facility]) injection 4 mg  4 mg Intravenous Q6H PRN Lisette Abu, PA-C   4 mg at 04/05/16 0406  . oxyCODONE (Oxy IR/ROXICODONE) immediate release tablet 10-20 mg  10-20 mg Oral Q4H PRN Lisette Abu, PA-C   20 mg  at 04/12/16 1901  . QUEtiapine (SEROQUEL) tablet 25 mg  25  mg Oral QHS Lisette Abu, PA-C   25 mg at 04/12/16 2127  . sertraline (ZOLOFT) tablet 100 mg  100 mg Oral Daily Ambrose Finland, MD   100 mg at 04/12/16 1100  . sodium chloride flush (NS) 0.9 % injection 10-40 mL  10-40 mL Intracatheter Q12H Georganna Skeans, MD   10 mL at 04/12/16 1100  . sodium chloride flush (NS) 0.9 % injection 10-40 mL  10-40 mL Intracatheter PRN Georganna Skeans, MD   10 mL at 04/11/16 2111    Musculoskeletal: Strength & Muscle Tone: decreased Gait & Station: unsteady Patient leans: N/A  Psychiatric Specialty Exam: Physical Exam  Constitutional: She is oriented to person, place, and time. She appears well-developed and well-nourished.  HENT:  Head: Normocephalic.  Respiratory: Effort normal.  Neurological: She is alert and oriented to person, place, and time.  Skin: Skin is warm and dry.  Psychiatric: Her speech is normal and behavior is normal. Cognition and memory are normal. She expresses impulsivity. She exhibits a depressed mood. She expresses suicidal ideation.    Review of Systems  Constitutional: Negative.   Eyes: Negative.   Respiratory: Negative.   Cardiovascular: Negative.   Gastrointestinal: Negative.   Genitourinary: Negative.   Musculoskeletal: Positive for back pain and neck pain.  Skin: Negative.   Neurological: Negative.   Endo/Heme/Allergies: Negative.   Psychiatric/Behavioral: Positive for depression and suicidal ideas.    Blood pressure 105/56, pulse 88, temperature 98.6 F (37 C), temperature source Oral, resp. rate 18, height 5' 11"  (1.803 m), weight 94.8 kg (208 lb 15.9 oz), SpO2 94 %.Body mass index is 29.16 kg/(m^2).  General Appearance: Casual  Eye Contact:  Fair  Speech:  Normal Rate  Volume:  Decreased  Mood:  Depressed  Affect:  Flat  Thought Process:  Coherent  Orientation:  Full (Time, Place, and Person)  Thought Content:  Rumination  Suicidal Thoughts:  Yes, with intent  Homicidal Thoughts:  No  Memory:   Immediate;   Fair Recent;   Fair Remote;   Fair  Judgement:  Poor  Insight:  Fair  Psychomotor Activity:  Decreased  Concentration:  Concentration: Fair and Attention Span: Fair  Recall:  [employer] of Knowledge:  Fair  Language:  Good  Akathisia:  No  Handed:  Right  AIMS (if indicated):     Assets:  Leisure Time Physical Health Resilience Social Support  ADL's:  Intact  Cognition:  WNL  Sleep:        Treatment Plan Summary: Daily contact with patient to assess and evaluate symptoms and progress in treatment, Medication management and Plan major depressive disorder, recurrent, severe without psychosis:  -Crisis stabilization -Medication management:  Continue Seroquel 25 mg at bedtime for mood stabilization and sleep, Zoloft 100 mg daily for depression and anxiety, Klonopin 0.5 BID for anxiety along with her other medical medications -Individual counseling  Disposition: Recommend psychiatric Inpatient admission when medically cleared.  Waylan Boga, NP 04/12/2016 9:55 PM

## 2016-04-12 NOTE — Progress Notes (Signed)
Nutrition Follow-up  DOCUMENTATION CODES:   Obesity unspecified  INTERVENTION:   -Ensure Enlive po TID, each supplement provides 350 kcal and 20 grams of protein  NUTRITION DIAGNOSIS:   Increased nutrient needs related to  (trauma) as evidenced by estimated needs.  Ongoing  GOAL:   Patient will meet greater than or equal to 90% of their needs  Progressing  MONITOR:   PO intake, Supplement acceptance, Labs, Weight trends, Skin, I & O's  REASON FOR ASSESSMENT:   Consult Enteral/tube feeding initiation and management  ASSESSMENT:   Pt from DC with no past medical hx admitted after suicide attempt by ingestion of Drano, 2 bottles of acetaminophen, nyquil, and possibly 6 alprazolam pills and jumping from 5th floor balcony of hotel. 4/3 pt is s/p exp lap, hepatorraphy, SBR, cholecystectomy, preperitoneal pelvic packing, ex fix pelvis, R HPTX, Bil pulmonary contusions. Plan for repeat OR 4/5 for another possible SBR. Abdomen remains open.   Pt decannulated on rt chest tube removed on 04/09/16.   Per MD notes, TF was d/c this AM. Pt is tolerating regular diet texture, however, intake is poor due to pt not liking food. During last RD visit, pt family was proving outside food. Meal completion 25%.   Psych CSW following for inpatient psychiatric admission.   Labs reviewed: Na: 134, CBGS: 143-170.   Diet Order:  Diet regular Room service appropriate?: Yes; Fluid consistency:: Thin  Skin:  Reviewed, no issues (incisions)  Last BM:  04/10/16  Height:   Ht Readings from Last 1 Encounters:  03/18/16 5\' 11"  (1.803 m)    Weight:   Wt Readings from Last 1 Encounters:  04/11/16 208 lb 15.9 oz (94.8 kg)    Ideal Body Weight:  70.4 kg  BMI:  Body mass index is 29.16 kg/(m^2).  Estimated Nutritional Needs:   Kcal:  2100-2300  Protein:  130-150 grams  Fluid:  >2 L/day  EDUCATION NEEDS:   No education needs identified at this time  Dheeraj Hail A. Mayford KnifeWilliams, RD, LDN,  CDE Pager: [phone] After hours Pager: [phone]

## 2016-04-12 NOTE — Progress Notes (Signed)
LOS: 63 days   Subjective: Complains of feeling fatigued, but looks well. Tolerating diet well. Wants to know if meds can be changed to mostly PO.   Objective: Vital signs in last 24 hours: Temp:  [98.4 F (36.9 C)-98.5 F (36.9 C)] 98.5 F (36.9 C) (06/05 0500) Pulse Rate:  [85-97] 87 (06/05 0500) Resp:  [18] 18 (06/05 0500) BP: (104-116)/(63-69) 111/67 mmHg (06/05 0500) SpO2:  [95 %-98 %] 95 % (06/05 0500) Last BM Date: 04/10/16   Laboratory  CBC  Recent Labs  04/12/16 0523  WBC 15.3*  HGB 10.8*  HCT 35.0*  PLT 288   BMET  Recent Labs  04/12/16 0523  NA 134*  K 3.7  CL 95*  CO2 29  GLUCOSE 114*  BUN 15  CREATININE 0.44  CALCIUM 9.2   I/O last 3 completed shifts: In: 1670 [P.O.:600; I.V.:600; NG/GT:470] Out: -      Physical Exam General: alert and oriented, cooperative, no distress Resp: CTAB Cardio: RRR, no M/G/R GI: soft, BS normal   Assessment/Plan: s/p Procedure(s): ABDOMINAL WOUND CLOSURE PERCUTANEOUS TRACHEOSTOMY  Jump from hotel/multiple toxic ingestions -Continue on Zoloft, sitter for safety. D/C lexapro  S/P ex lap, hepatorraphy, SBR, cholecystectomy, preperitoneal pelvic packing [name] 4/3  Ex lap for acidosis/bleeding 4/4 [place] Ex lap for bleeding 4/5 [name] S/P ex fix pelvis [name] 4/3 S/P ex lap 4/7 [place] S/P ex lap 4/10 [place] S/P sacral screws 4/10 [name] S/P application ABRA 4/12 [name]  S/P abd closure, trach 4/24 [name] Resp failure - tolerating trach stoma occlusion. Mult B rib FX/R HPTX, B pulm contusions - IR placed Chest tube drain (03/27/16) for pleural effusion, TCTS following.Pigtail drain placed 5/20 and removed 04/10/16 Multiple toxic ingestions Mult L wrist FXs - splint until f/u with Dr. Melvyn Novasrtmann as OP, can WBAT Pelvic FX - S/P ex fix and angioembolization, S/P sacral screw by Dr. Carola FrostHandy. Bed to chair transfers only for another 4-6 weeks, Pivot with to assist with transfers. ABL anemia FEN - Regular diet,  D/C tube feeds. D/C IV fluids. D/C benadryl. D/C lasix. D/C robotussin. D/C haldol. Decrease klonopin. Convert meds to PO. Trach - removed 04/09/16 VTE - SCD's, Restart lovenox. Dispo [facility]  LOS: 63 days  Wells GuilesKelly Rayburn PA-S  04/12/2016

## 2016-04-12 NOTE — Progress Notes (Signed)
Physical Therapy Treatment Patient Details Name: [name] BobKatia D Bell MRN: [id number] DOB: 08/13/1981 Today's Date: 04/12/2016    History of Present Illness [name] Bell is a 35 y.o. female.She is obese. PMH unknown.Pt attempted suicide: taped DNR note to chest, drank Drano, then jumped off 5 story building ([address]).S/p 02/09/16 gel foam embolization of bil internal iliac arteries.S/p 02/09/16 ex lap with cholecystectomy, resection SB and mesenteric repair, liver lac repair, application or wound vac.S/p 02/09/16 closed reduction of pelvic ring, external pelvic fixation.Also has right metacarpal fracture in splint. Pt on and off intubation via trach/ and on/off trac collar.  Pelvic x- fixator pins removed at bedside on 03/23/16.     PT Comments    Pt required assistance from PTA to train nurse tech on transfer back to bed with stedy lift equipment.  Pt in increased pain.    Follow Up Recommendations  CIR     Equipment Recommendations  Wheelchair (measurements PT);Wheelchair cushion (measurements PT);Hospital bed;Other (comment);3in1 (PT)    Recommendations for Other Services Rehab consult     Precautions / Restrictions Precautions Precautions: Fall Precaution Comments: chest tube and teach removed, feeding tube to be discontinued.   Required Braces or Orthoses: Other Brace/Splint Other Brace/Splint: L wrist in splint, abdominal binder Restrictions Weight Bearing Restrictions: No LUE Weight Bearing: Weight bearing as tolerated RLE Weight Bearing: Weight bearing as tolerated LLE Weight Bearing: Weight bearing as tolerated Other Position/Activity Restrictions: WBAT B    Mobility  Bed Mobility Overal bed mobility: Needs Assistance;+2 for physical assistance Bed Mobility: Supine to Sit     Supine to sit: Max assist;+2 for physical assistance Sit to supine: Mod assist;+2 for physical assistance   General bed mobility comments: PTA assisted tech to transfer patient to  supine.    Transfers Overall transfer level: Needs assistance Equipment used: Rolling walker (2 wheeled) Transfers: Sit to/from Stand Sit to Stand: Mod assist;+2 physical assistance         General transfer comment: PTA instructed CNA/sitter in transfer training to assist patient back to bed.  Shortly after tx nursing paged PTA for back to bed assistance.  Pt performed stedy transfer x2 and positioned to transfer to bed.  Nursing educated on stedy use for safety.    Ambulation/Gait Ambulation/Gait assistance:  (remains with orders for bed to chair transfers only.)               Stairs            Wheelchair Mobility    Modified Rankin (Stroke Patients Only)       Balance Overall balance assessment: Needs assistance   Sitting balance-[name]: Fair       Standing balance-[name]: Poor                      Cognition Arousal/Alertness: Awake/alert Behavior During Therapy: WFL for tasks assessed/performed Overall Cognitive Status: Within Functional Limits for tasks assessed                      Exercises      General Comments        Pertinent Vitals/Pain Pain Assessment: 0-10 Pain Score: 10-Worst pain ever Pain Location: B thighs and groin.   Pain Descriptors / Indicators: Discomfort;Grimacing;Guarding;Crying Pain Intervention(s): Monitored during session;Repositioned    Home Living                      Prior Function  PT Goals (current goals can now be found in the care plan section) Acute Rehab PT Goals Patient Stated Goal: Go home PT Goal Formulation: Patient unable to participate in goal setting Potential to Achieve Goals: Good Additional Goals Additional Goal #1: Pt will propel WC >100', supervision for propel, min assist for parts/set up Progress towards PT goals: Progressing toward goals    Frequency  Min 5X/week    PT Plan Current plan remains appropriate    Co-evaluation              End of Session Equipment Utilized During Treatment:  (gt belt not used due to abdominal incision and chest tube site soreness.  ) Activity Tolerance: Patient tolerated treatment well Patient left: in chair;with call bell/phone within reach     Time: 1446-1458 PT Time Calculation (min) (ACUTE ONLY): 12 min  Charges:  $Therapeutic Activity: 8-22 mins                    G Codes:      Florestine Avers 04/29/2016, 3:02 PM  Joycelyn Rua, PTA pager [phone]

## 2016-04-13 MED ORDER — BACITRACIN ZINC 500 UNIT/GM EX OINT
TOPICAL_OINTMENT | Freq: Two times a day (BID) | CUTANEOUS | Status: DC
  Administered 2016-04-13 (×2): via TOPICAL
  Administered 2016-04-14: 1 via TOPICAL
  Administered 2016-04-14 – 2016-04-22 (×14): via TOPICAL
  Administered 2016-04-22: 1 via TOPICAL
  Administered 2016-04-23 – 2016-04-28 (×12): via TOPICAL
  Administered 2016-04-29: 1 via TOPICAL
  Administered 2016-04-29 – 2016-05-01 (×5): via TOPICAL
  Administered 2016-05-02: 1 via TOPICAL
  Administered 2016-05-02 – 2016-05-03 (×2): via TOPICAL
  Administered 2016-05-04: 1 via TOPICAL
  Administered 2016-05-04 – 2016-05-05 (×2): via TOPICAL
  Administered 2016-05-05: 1 via TOPICAL
  Administered 2016-05-06: 14:00:00 via TOPICAL
  Administered 2016-05-07: 1 via TOPICAL
  Administered 2016-05-07 – 2016-05-08 (×3): via TOPICAL
  Administered 2016-05-09: 1 via TOPICAL
  Administered 2016-05-09 – 2016-05-12 (×6): via TOPICAL
  Administered 2016-05-12: 1 via TOPICAL
  Administered 2016-05-13 – 2016-05-19 (×6): via TOPICAL
  Administered 2016-05-19: 1 via TOPICAL
  Administered 2016-05-20 – 2016-05-25 (×9): via TOPICAL
  Administered 2016-05-25: 1 via TOPICAL
  Administered 2016-05-26 – 2016-05-30 (×8): via TOPICAL
  Administered 2016-05-31: 1 via TOPICAL
  Administered 2016-05-31: 10:00:00 via TOPICAL
  Administered 2016-06-01: 1 via TOPICAL
  Administered 2016-06-01 – 2016-06-02 (×2): via TOPICAL
  Administered 2016-06-02: 1 via TOPICAL
  Administered 2016-06-03: 09:00:00 via TOPICAL
  Filled 2016-04-13 (×10): qty 28.35

## 2016-04-13 MED ORDER — CLONAZEPAM 0.5 MG PO TABS
0.5000 mg | ORAL_TABLET | Freq: Every day | ORAL | Status: DC
  Administered 2016-04-13 – 2016-06-02 (×37): 0.5 mg via ORAL
  Filled 2016-04-13 (×46): qty 1

## 2016-04-13 NOTE — Progress Notes (Signed)
While rounding check in with the patient as a follow up visit. The patient was in good spirits and we had a good conversation. I offered prayer on her behalf. Will follow up as needed.  Lorne SkeensBenjamin T Gennie Dib 3:50 PM    04/13/16 1500  Clinical Encounter Type  Visited With Patient  Visit Type Follow-up;Spiritual support  Referral From Nurse  Spiritual Encounters  Spiritual Needs Prayer;Emotional

## 2016-04-13 NOTE — Progress Notes (Signed)
Physical Therapy Treatment Patient Details Name: [name] Bell MRN: [id number] DOB: [birth year] Today's Date: 04/13/2016    History of Present Illness [name] Bell is a 35 y.o. female.She is obese. PMH unknown.Pt attempted suicide: taped DNR note to chest, drank Drano, then jumped off 5 story building ([address]S/p 02/09/16 gel foam embolization of bil internal iliac arteries.S/p 02/09/16 ex lap with cholecystectomy, resection SB and mesenteric repair, liver lac repair, application or wound vac.S/p 02/09/16 closed reduction of pelvic ring, external pelvic fixation.Also has right metacarpal fracture in splint. Pt on and off intubation via trach/ and on/off trac collar.  Pelvic x- fixator pins removed at bedside on 03/23/16.     PT Comments    Pt reports increased pain and discomfort and refused OOB activity today.  Pt aggreable to supine therapeutic exercise and repositioning in bed.  Will continue efforts during hospitalization and continue to encourage OOB activity.    Follow Up Recommendations        Equipment Recommendations       Recommendations for Other Services       Precautions / Restrictions Precautions Precautions: Fall Precaution Comments: chest tube and teach removed, feeding tube to be discontinued.   Required Braces or Orthoses: Other Brace/Splint Other Brace/Splint: L wrist in splint, abdominal binder Restrictions Weight Bearing Restrictions: No LUE Weight Bearing: Weight bearing as tolerated RLE Weight Bearing: Weight bearing as tolerated LLE Weight Bearing: Weight bearing as tolerated Other Position/Activity Restrictions: WBAT B    Mobility  Bed Mobility               General bed mobility comments: Pt declined bed mobility.    Transfers                 General transfer comment: Pt declined OOB transfer.  Ambulation/Gait                 Stairs            Wheelchair Mobility    Modified Rankin (Stroke Patients  Only)       Balance                                    Cognition Arousal/Alertness: Awake/alert Behavior During Therapy: WFL for tasks assessed/performed Overall Cognitive Status: Within Functional Limits for tasks assessed                      Exercises Total Joint Exercises Ankle Circles/Pumps: AROM;Both;10 reps;Supine Quad Sets: AROM;Both;10 reps;Supine Gluteal Sets: AROM;Both;10 reps;Supine Towel Squeeze: AROM;Both;10 reps;Supine Heel Slides: AROM;Both;10 reps;Supine    General Comments        Pertinent Vitals/Pain Pain Assessment: 0-10 Pain Score: 7  Pain Location: B thighs and groin Pain Descriptors / Indicators: Discomfort;Guarding;Grimacing;Crying Pain Intervention(s): Monitored during session;Repositioned    Home Living                      Prior Function            PT Goals (current goals can now be found in the care plan section) Acute Rehab PT Goals Patient Stated Goal: Go home Additional Goals Additional Goal #1: Pt will propel WC >100', supervision for propel, min assist for parts/set up Progress towards PT goals: Progressing toward goals    Frequency       PT Plan Current plan remains appropriate    Co-evaluation  End of Session   Activity Tolerance: Patient tolerated treatment well Patient left: in chair;with call bell/phone within reach     Time: 1143-1202 PT Time Calculation (min) (ACUTE ONLY): 19 min  Charges:  $Therapeutic Exercise: 8-22 mins                    G Codes:      [name] Bell 2016/04/30, 12:07 PM Joycelyn Rua, PTA pager [phone]

## 2016-04-13 NOTE — Progress Notes (Signed)
Patient ID: Phylliss BobKatia D Ardizzone, female   DOB: 1981-01-12, 35 y.o.   MRN: [id number]   LOS: 64 days   Subjective: Having issues with abd pain after she eats   Objective: Vital signs in last 24 hours: Temp:  [98.1 F (36.7 C)-100.8 F (38.2 C)] 98.1 F (36.7 C) (06/06 0346) Pulse Rate:  [85-89] 85 (06/06 0346) Resp:  [17-18] 17 (06/06 0346) BP: (105-112)/(56-66) 109/60 mmHg (06/06 0346) SpO2:  [94 %-98 %] 98 % (06/06 0346) Last BM Date: 04/11/16   Physical Exam General appearance: alert and no distress Resp: clear to auscultation bilaterally Cardio: regular rate and rhythm GI: Soft, +BS, g-tube site moderately tender to manipulation, midline wound granulating excellently, skin level inferiorly, broad and shallow superiorly   Assessment/Plan: Jump from hotel/multiple toxic ingestions -Continue on Zoloft, sitter for safety.  S/P ex lap, hepatorraphy, SBR, cholecystectomy, preperitoneal pelvic packing [name] 4/3  Ex lap for acidosis/bleeding 4/4 Thompson Ex lap for bleeding 4/5 [name] S/P ex fix pelvis [name] 4/3 S/P ex lap 4/7 Thompson S/P ex lap 4/10 Thompson S/P sacral screws 4/10 [name] S/P application ABRA, open gastrostomy tube 4/12 [name]  S/P abd closure, trach 4/24 [name] Mult B rib FX/R HPTX, B pulm contusions - IR placed Chest tube drain (03/27/16) for pleural effusion, TCTS following.Pigtail drain placed 5/20 and removed 04/10/16 Multiple toxic ingestions Mult L wrist FXs - splint until f/u with Dr. Melvyn Novasrtmann as OP, can WBAT Pelvic FX - S/P ex fix and angioembolization, S/P sacral screw by Dr. Carola FrostHandy. Bed to chair transfers only for another 4-6 weeks, Pivot with to assist with transfers. ABL anemia FEN - Will stop w-t-d abd dressings. Will check with MD on feeding tube removal. Continue to wean Klonopin, d/c Seroquel. D/C central line. VTE - SCD's, Lovenox. Dispo [facility]    Freeman CaldronMichael J. Dearion Huot, PA-C Pager: [phone] General Trauma PA Pager: [phone]   04/13/2016

## 2016-04-13 NOTE — Clinical Social Work Psych Note (Signed)
Psych CS[facility] to inquire if patient was added to waitlist.  [facility] is now requesting last days labs/vitals and current weight.  Psych CS[facility].  Decision currently pending.  [facility]  Vickii PennaGina Jemmie Ledgerwood, LCSW [phone]  [address] Psych[facility] Service Line Licensed Clinical Social Worker

## 2016-04-13 NOTE — Progress Notes (Signed)
Speech Language Pathology Treatment: Dysphagia;Passy Muir Speaking valve  Patient Details Name: [name] Bell MRN: [id number] DOB: 1981-09-23 Today's Date: 04/13/2016 Time: 1550-1600 SLP Time Calculation (min) (ACUTE ONLY): 10 min  Assessment / Plan / Recommendation Clinical Impression  Pt underwent MBS 04/09/16, and since then has tolerated regular diet and thin liquids without overt difficulty reported. Pt declined po intake at this time, due to abdominal discomfort. Pt was decannulated 04/09/16 as well, negating the need for PMSV use. Pt has made significant improvement during this admission, but is no longer in need of skilled ST services.  ST will sign off at this time. Please reconsult if needs arise.    HPI HPI: [name] Bell is a 35 y.o. female.PMH unknown.Pt attempted suicide: taped DNR note to chest, drank Drano, then jumped off 5 story building ([address]).S/p 02/09/16 gel foam embolization of bil internal iliac arteries.S/p 02/09/16 ex lap with cholecystectomy, resection SB and mesenteric repair, liver lac repair, application or wound vac.S/p 02/09/16 closed reduction of pelvic ring, external pelvic fixation.Also has right metacarpal fracture in splint. Pt has been decannulated since 04/09/16.   SLP Plan  Discharge SLP treatment due to (comment);All goals met     Recommendations  Diet recommendations: Regular;Thin liquid Liquids provided via: Cup;Straw Medication Administration: Whole meds with liquid Supervision: Patient able to self feed Compensations: Minimize environmental distractions;Slow rate;Small sips/bites Postural Changes and/or Swallow Maneuvers: Seated upright 90 degrees   Oral Care Recommendations: Oral care BID Follow up Recommendations: None Plan: Discharge SLP treatment due to (comment);All goals met     Shonna Chock 04/13/2016, 4:05 PM  Osmel D[address]stra [facility] [address], [place] [phone]

## 2016-04-13 NOTE — Progress Notes (Signed)
OT Cancellation Note  Patient Details Name: Phylliss BobKatia D Creson MRN: [id number] DOB: [birth year]   Cancelled Treatment:    Reason Eval/Treat Not Completed: Patient at procedure or test/ unavailable. Nsg completing dressing changes. Will attempt tomorrow.   [facility]  Kaleab Frasier, OTR/L  [phone] 04/13/2016 04/13/2016, 1:05 PM

## 2016-04-14 ENCOUNTER — Inpatient Hospital Stay (HOSPITAL_COMMUNITY): Payer: Medicaid Other

## 2016-04-14 MED ORDER — DOCUSATE SODIUM 100 MG PO CAPS
100.0000 mg | ORAL_CAPSULE | Freq: Every day | ORAL | Status: DC
  Administered 2016-04-14 – 2016-04-24 (×7): 100 mg via ORAL
  Filled 2016-04-14 (×11): qty 1

## 2016-04-14 MED ORDER — POLYETHYLENE GLYCOL 3350 17 G PO PACK
17.0000 g | PACK | Freq: Every day | ORAL | Status: DC
  Administered 2016-04-14 – 2016-05-16 (×4): 17 g via ORAL
  Filled 2016-04-14 (×22): qty 1

## 2016-04-14 NOTE — Progress Notes (Signed)
Physical Therapy Treatment Patient Details Name: [name] Bell MRN: [id number] DOB: 1981-02-16 Today's Date: 04/14/2016    History of Present Illness [name] Bell is a 35 y.o. female.She is obese. PMH unknown.Pt attempted suicide: taped DNR note to chest, drank Drano, then jumped off 5 story building ([address]S/p 02/09/16 gel foam embolization of bil internal iliac arteries.S/p 02/09/16 ex lap with cholecystectomy, resection SB and mesenteric repair, liver lac repair, application or wound vac.S/p 02/09/16 closed reduction of pelvic ring, external pelvic fixation.Also has right metacarpal fracture in splint. Pt on and off intubation via trach/ and on/off trac collar.  Pelvic x- fixator pins removed at bedside on 03/23/16.     PT Comments    Pt performed decreased activity and able to tolerate bed level exercises.  Spoke with patient's sister to encourage OOB activity and participation with therapies throughout the day.  Pt remains to appear depressed will inform supervising PT to reassess pt at this time.     Follow Up Recommendations  CIR     Equipment Recommendations  Wheelchair (measurements PT);Wheelchair cushion (measurements PT);Hospital bed;Other (comment);3in1 (PT)    Recommendations for Other Services Rehab consult     Precautions / Restrictions Precautions Precautions: Fall Required Braces or Orthoses: Other Brace/Splint Other Brace/Splint: L wrist in splint, abdominal binder Restrictions Weight Bearing Restrictions: No LUE Weight Bearing: Weight bearing as tolerated RLE Weight Bearing: Weight bearing as tolerated LLE Weight Bearing: Weight bearing as tolerated Other Position/Activity Restrictions: WBAT B    Mobility  Bed Mobility Overal bed mobility: Needs Assistance   Rolling: Mod assist (for positiong.  )         General bed mobility comments: pt up in recliner upon entering room  Transfers Overall transfer level: Needs assistance Equipment  used: Rolling walker (2 wheeled) Transfers: Sit to/from Stand Sit to Stand: Mod assist;+2 physical assistance         General transfer comment: pt required MAX encouragement to initiate sit - stand, pt anxious and fearful, wanting to pull up on RW. [facility] with min A for balance/suport x 30 seconds  Ambulation/Gait                 Stairs            Wheelchair Mobility    Modified Rankin (Stroke Patients Only)       Balance     Sitting balance-[name]: Fair       Standing balance-[name]: Poor                      Cognition Arousal/Alertness: Awake/alert Behavior During Therapy: WFL for tasks assessed/performed Overall Cognitive Status: Within Functional Limits for tasks assessed         Following Commands: Follows multi-step commands with increased time     Problem Solving: Difficulty sequencing;Requires verbal cues;Decreased initiation      Exercises Total Joint Exercises Ankle Circles/Pumps: AROM;Both;10 reps;Supine Quad Sets: AROM;Both;10 reps;Supine Gluteal Sets: AROM;Both;10 reps;Supine Towel Squeeze: AROM;Both;10 reps;Supine Heel Slides: AROM;Both;10 reps;Supine    General Comments        Pertinent Vitals/Pain Pain Assessment: 0-10 Pain Score: 6  Faces Pain Scale: Hurts a little bit Pain Location: stomach Pain Descriptors / Indicators: Discomfort;Grimacing;Guarding Pain Intervention(s): Monitored during session;Repositioned    Home Living                      Prior Function  PT Goals (current goals can now be found in the care plan section) Acute Rehab PT Goals Patient Stated Goal: Go home Additional Goals Additional Goal #1: Pt will propel WC >100', supervision for propel, min assist for parts/set up Progress towards PT goals: Progressing toward goals    Frequency  Min 5X/week    PT Plan Current plan remains appropriate    Co-evaluation             End of Session  Equipment Utilized During Treatment: Gait belt Activity Tolerance: Patient tolerated treatment well Patient left: with call bell/phone within reach;in bed     Time: 1610-9604 PT Time Calculation (min) (ACUTE ONLY): 17 min  Charges:  $Therapeutic Exercise: 8-22 mins                    G Codes:      Florestine Avers May 10, 2016, 2:45 PM Joycelyn Rua, PTA pager [phone]

## 2016-04-14 NOTE — Progress Notes (Signed)
Occupational Therapy Treatment Patient Details Name: [name] Bell MRN: [id number] DOB: October 10, 1981 Today's Date: 04/14/2016    History of present illness [name] Bell is a 35 y.o. female.She is obese. PMH unknown.Pt attempted suicide: taped DNR note to chest, drank Drano, then jumped off 5 story building ([address]).S/p 02/09/16 gel foam embolization of bil internal iliac arteries.S/p 02/09/16 ex lap with cholecystectomy, resection SB and mesenteric repair, liver lac repair, application or wound vac.S/p 02/09/16 closed reduction of pelvic ring, external pelvic fixation.Also has right metacarpal fracture in splint. Pt on and off intubation via trach/ and on/off trac collar.  Pelvic x- fixator pins removed at bedside on 03/23/16.    OT comments  pt required MAX encouragement to initiate sit - stand, pt anxious and fearful, wanting to pull up on RW. Pt stood at [facility] with min A for balance/suport x 30 seconds. Pt very pleasant, however was not very motivated. Pt c/o stomach pain and that she is "overdoing things". OT will continue to follow  Follow Up Recommendations  CIR    Equipment Recommendations  Hospital bed;Wheelchair cushion (measurements OT);Wheelchair (measurements OT);3 in 1 bedside comode;Other (comment)    Recommendations for Other Services      Precautions / Restrictions Precautions Precautions: Fall Required Braces or Orthoses: Other Brace/Splint Other Brace/Splint: L wrist in splint, abdominal binder Restrictions Weight Bearing Restrictions: No LUE Weight Bearing: Weight bearing as tolerated RLE Weight Bearing: Weight bearing as tolerated LLE Weight Bearing: Weight bearing as tolerated       Mobility Bed Mobility               General bed mobility comments: pt up in recliner upon entering room  Transfers Overall transfer level: Needs assistance Equipment used: Rolling walker (2 wheeled) Transfers: Sit to/from Stand Sit to Stand: Mod assist;+2 physical  assistance         General transfer comment: pt required MAX encouragement to initiate sit - stand, pt anxious and fearful, wanting to pull up on RW. Pt stood at [facility] with min A for balance/suport x 30 seconds    Balance  poor                                 ADL       Grooming: Set up;Supervision/safety;Wash/dry face;Sitting Grooming Details (indicate cue type and reason): pt up in recliner. Pt instructed to scoot hips forward in recliner to sit unsupported. Pt c/o of abdominal pain after approximately 20 second and began scooting hips back in chair                                      Vision  wears glasses, no change from baseline per pt                              Cognition   Behavior During Therapy: [facility] for tasks assessed/performed            Following Commands: Follows multi-step commands with increased time     Problem Solving: Difficulty sequencing;Requires verbal cues;Decreased initiation      Extremity/Trunk Assessment   L wrist/forearm in brace                        General Comments  pt pleasant and anxious, not very motivated    Pertinent Vitals/ Pain       Pain Assessment: 0-10 Pain Score: 6  Pain Location: stomach Pain Descriptors / Indicators: Discomfort;Grimacing;Guarding Pain Intervention(s): Limited activity within patient's tolerance;Monitored during session;Repositioned  Home Living  lives with her sister                                        Prior Functioning/Environment   independent           Frequency Min 3X/week     Progress Toward Goals  OT Goals(current goals can now be found in the care plan section)  Progress towards OT goals: OT to reassess next treatment     Plan Discharge plan remains appropriate                     End of Session Equipment Utilized During Treatment: Gait belt;Rolling walker   Activity Tolerance Patient limited by pain  (anxiety)   Patient Left in chair;with call bell/phone within reach;with nursing/sitter in room   Nurse Communication          Time: 0454-09811001-1021 OT Time Calculation (min): 20 min  Charges: OT General Charges $OT Visit: 1 Procedure OT Treatments $Therapeutic Activity: 8-22 mins  Galen ManilaSpencer, Ayaansh Smail Jeanette 04/14/2016, 12:16 PM

## 2016-04-14 NOTE — Progress Notes (Signed)
Patient's sister requesting that the patient be placed on an air overly due to patient being "extremely uncomfortable even with 6 pillows under her". RN paged MD on call. Awaiting a return call.

## 2016-04-14 NOTE — Progress Notes (Signed)
LOS: 65 days   Subjective: Still tender in the abdomen, specifically around wound. Was able to eat some grits this morning and felt that went well.   Objective: Vital signs in last 24 hours: Temp:  [97.7 F (36.5 C)-99.6 F (37.6 C)] 99.6 F (37.6 C) (06/07 0430) Pulse Rate:  [88-96] 91 (06/07 0430) Resp:  [17-18] 18 (06/07 0430) BP: (109-119)/(60-70) 119/67 mmHg (06/07 0430) SpO2:  [95 %-98 %] 98 % (06/07 0430) Last BM Date: 04/13/16   Laboratory  CBC  Recent Labs  04/12/16 0523  WBC 15.3*  HGB 10.8*  HCT 35.0*  PLT 288   BMET  Recent Labs  04/12/16 0523  NA 134*  K 3.7  CL 95*  CO2 29  GLUCOSE 114*  BUN 15  CREATININE 0.44  CALCIUM 9.2     Physical Exam General: alert and oriented. Pleasant but in obvious discomfort. Resp: CTAB Cardio: RRR, no M/G/R GI: soft, tender, normal BS, wounds appear clean and dry.   Assessment/Plan: Jump from hotel/multiple toxic ingestions -Continue on Zoloft, sitter for safety.  S/P ex lap, hepatorraphy, SBR, cholecystectomy, preperitoneal pelvic packing [name] 4/3  Ex lap for acidosis/bleeding 4/4 Thompson Ex lap for bleeding 4/5 [name] S/P ex fix pelvis [name] 4/3 S/P ex lap 4/7 Thompson S/P ex lap 4/10 Thompson S/P sacral screws 4/10 [name] S/P application ABRA, open gastrostomy tube 4/12 [name]  S/P abd closure, trach 4/24 [name] Mult B rib FX/R HPTX, B pulm contusions - IR placed Chest tube drain (03/27/16) for pleural effusion, TCTS following.Pigtail drain placed 5/20 and removed 04/10/16 Multiple toxic ingestions Mult L wrist FXs - splint until f/u with Dr. Melvyn Novasrtmann as OP, can WBAT Pelvic FX - S/P ex fix and angioembolization, S/P sacral screw by Dr. Carola FrostHandy. Bed to chair transfers only for another 4-6 weeks, Pivot with to assist with transfers. ABL anemia FEN - Encourage PO intake VTE - SCD's, Lovenox. Dispo [facility]  CBC ordered for tomorrow morning.  Inocencia Murtaugh Rayburn PA-S  04/14/2016

## 2016-04-15 LAB — CBC
HEMATOCRIT: 34.3 % — AB (ref 36.0–46.0)
HEMOGLOBIN: 10.6 g/dL — AB (ref 12.0–15.0)
MCH: 28 pg (ref 26.0–34.0)
MCHC: 30.9 g/dL (ref 30.0–36.0)
MCV: 90.5 fL (ref 78.0–100.0)
Platelets: 243 10*3/uL (ref 150–400)
RBC: 3.79 MIL/uL — ABNORMAL LOW (ref 3.87–5.11)
RDW: 15 % (ref 11.5–15.5)
WBC: 12.8 10*3/uL — ABNORMAL HIGH (ref 4.0–10.5)

## 2016-04-15 NOTE — Progress Notes (Signed)
Physical Therapy Treatment Patient Details Name: [name] Bell MRN: [id number] DOB: 04-02-1981 Today's Date: 04/15/2016    History of Present Illness [name] Bell is a 35 y.o. female.She is obese. PMH unknown.Pt attempted suicide: taped DNR note to chest, drank Drano, then jumped off 5 story building ([address]).S/p 02/09/16 gel foam embolization of bil internal iliac arteries.S/p 02/09/16 ex lap with cholecystectomy, resection SB and mesenteric repair, liver lac repair, application or wound vac.S/p 02/09/16 closed reduction of pelvic ring, external pelvic fixation.Also has right metacarpal fracture in splint. Pt on and off intubation via trach/ and on/off trac collar.  Pelvic x- fixator pins removed at bedside on 03/23/16.     PT Comments    Upon PT's arrival pt had just returned to bed and was too fatigued and c/o R shoulder pain and declined standing.  Discussed with pt and sitter what she had done this AM which included sitting up for 5 hours and transferred with RW and also transferred with stedy to recliner and then to w/c.  Discussed importance of getting up multiple times a day.  If possible, will have PT come in the AM when pt is more agreeable to OOB and not as fatigued.  Explained that sometimes, things happened and therapy may not be able to make it in the AM, but would make best effort.  Also recommended that she get up again at dinner time after resting in afternoon and discussed with sitter and nurse tech.  Pt agreed with above plan to try and start getting up in evenings for dinner.  Goals still appropriate.  Also, noted MD stated pt can perform SPT with transfer to chair, it does not have to be static stand with chair brought behind her.  Follow Up Recommendations  CIR     Equipment Recommendations  Wheelchair (measurements PT);Wheelchair cushion (measurements PT);Hospital bed;Other (comment);3in1 (PT)    Recommendations for Other Services       Precautions /  Restrictions Precautions Precautions: Fall Other Brace/Splint: L wrist in splint, abdominal binder Restrictions Weight Bearing Restrictions: No LUE Weight Bearing: Weight bearing as tolerated RLE Weight Bearing: Weight bearing as tolerated LLE Weight Bearing: Weight bearing as tolerated    Mobility  Bed Mobility               General bed mobility comments: Pt had just returned to bed withing last 45 mins per sitter  Transfers                    Ambulation/Gait                 Stairs            Wheelchair Mobility    Modified Rankin (Stroke Patients Only)       Balance                                    Cognition Arousal/Alertness: Awake/alert Behavior During Therapy: WFL for tasks assessed/performed Overall Cognitive Status: Within Functional Limits for tasks assessed                      Exercises Total Joint Exercises Ankle Circles/Pumps: AROM;Both;10 reps;Supine Quad Sets: AROM;Both;10 reps;Supine Gluteal Sets: AROM;Both;10 reps;Supine Heel Slides: AROM;Both;10 reps;Supine    General Comments        Pertinent Vitals/Pain Pain Assessment: Faces Faces Pain Scale: Hurts even more Pain  Location: B LE Pain Descriptors / Indicators: Other (Comment) (vague complaints of generalized pain) Pain Intervention(s): Repositioned;Limited activity within patient's tolerance;Monitored during session    Home Living                      Prior Function            PT Goals (current goals can now be found in the care plan section) Acute Rehab PT Goals Patient Stated Goal: Go home Time For Goal Achievement: 04/22/16 Potential to Achieve Goals: Good Progress towards PT goals: Progressing toward goals    Frequency  Min 5X/week    PT Plan Current plan remains appropriate    Co-evaluation             End of Session   Activity Tolerance: Patient limited by fatigue Patient left: with call bell/phone  within reach;in bed;with nursing/sitter in room     Time: 1357-1415 PT Time Calculation (min) (ACUTE ONLY): 18 min  Charges:  $Therapeutic Exercise: 8-22 mins                    G Codes:      Arpita Fentress LUBECK 04/15/2016, 2:30 PM

## 2016-04-15 NOTE — Progress Notes (Signed)
LOS: 66 days   Subjective: Patient was sitting up in the chair. Having some trouble with eating, it is causing her to have a lot of abdominal pain. RN mentioned trying to see if a liquid diet was easier on her stomach for right now and she is attempting to try that out. Still having pain around the lower portion of her wound, but no change from how it has been. Having some difficulty with bowel movements but taking miralax, lots of cramping with this. Also c/o of shoulder and sacral pain that she feels is positionally related. Patient is very concerned that she is overdoing it with PT. She is pleased that she is getting stronger and able to stand and get up more, but has so much pain and muscle fatigue after that she can barely turn over in bed. Patient also frustrated with how much pain medicine she is having to take.  Objective: Vital signs in last 24 hours: Temp:  [98.2 F (36.8 C)-99.6 F (37.6 C)] 98.2 F (36.8 C) (06/08 0910) Pulse Rate:  [83-96] 96 (06/08 0910) Resp:  [16-24] 21 (06/08 0910) BP: (110-117)/(49-67) 112/60 mmHg (06/08 0910) SpO2:  [94 %-97 %] 96 % (06/08 0910) Weight:  [100.6 kg (221 lb 12.5 oz)] 100.6 kg (221 lb 12.5 oz) (06/08 0624) Last BM Date: 04/15/16   Laboratory  CBC  Recent Labs  04/15/16 0250  WBC 12.8*  HGB 10.6*  HCT 34.3*  PLT 243    Physical Exam General:alert and oriented, pleasant. No distress Resp: CTAB Cardio: RRR, no M/G/R GI: soft, tender around inferior aspect of wound, normal bowel sounds   Assessment/Plan: Jump from hotel/multiple toxic ingestions -Continue on Zoloft, sitter for safety.  S/P ex lap, hepatorraphy, SBR, cholecystectomy, preperitoneal pelvic packing [name] 4/3  Ex lap for acidosis/bleeding 4/4 Thompson Ex lap for bleeding 4/5 [name] S/P ex fix pelvis [name] 4/3 S/P ex lap 4/7 Thompson S/P ex lap 4/10 Thompson S/P sacral screws 4/10 [name] S/P application ABRA, open gastrostomy tube 4/12 [name]  S/P abd closure,  trach 4/24 [name] Mult B rib FX/R HPTX, B pulm contusions - IR placed Chest tube drain (03/27/16) for pleural effusion, TCTS following.Pigtail drain placed 5/20 and removed 04/10/16 Multiple toxic ingestions Mult L wrist FXs - splint until f/u with Dr. Melvyn Novasrtmann as OP, can WBAT Pelvic FX - S/P ex fix and angioembolization, S/P sacral screw by Dr. Carola FrostHandy. Bed to chair transfers only for another 4-6 weeks, Pivot with to assist with transfers. ABL anemia FEN - Encourage PO intake VTE - SCD's, Lovenox. [facility]po [facility]  CBC showed WBC trending down and patient is asymptomatic.Will repeat another CBC if  patient shows any signs of infection.  Aryanne Gilleland Rayburn PA-S  04/15/2016

## 2016-04-16 MED ORDER — TRAMADOL HCL 50 MG PO TABS
100.0000 mg | ORAL_TABLET | Freq: Four times a day (QID) | ORAL | Status: DC
Start: 2016-04-16 — End: 2016-04-22
  Administered 2016-04-16 – 2016-04-21 (×14): 100 mg via ORAL
  Administered 2016-04-21 – 2016-04-22 (×2): 50 mg via ORAL
  Filled 2016-04-16 (×21): qty 2

## 2016-04-16 MED ORDER — FERROUS SULFATE 325 (65 FE) MG PO TABS
325.0000 mg | ORAL_TABLET | Freq: Three times a day (TID) | ORAL | Status: DC
  Administered 2016-04-16 – 2016-05-05 (×8): 325 mg via ORAL
  Filled 2016-04-16 (×19): qty 1

## 2016-04-16 NOTE — Progress Notes (Signed)
Occupational Therapy Treatment Patient Details Name: [name] Bell MRN: [id number] DOB: 11/21/1980 Today's Date: 04/16/2016    History of present illness [name] Bell is a 35 y.o. female.She is obese. PMH unknown.Pt attempted suicide: taped DNR note to chest, drank Drano, then jumped off 5 story building ([address] [place]).S/p 02/09/16 gel foam embolization of bil internal iliac arteries.S/p 02/09/16 ex lap with cholecystectomy, resection SB and mesenteric repair, liver lac repair, application or wound vac.S/p 02/09/16 closed reduction of pelvic ring, external pelvic fixation.Also has right metacarpal fracture in splint. Pt on and off intubation via trach/ and on/off trac collar.  Pelvic x- fixator pins removed at bedside on 03/23/16.    OT comments  Patient tolerated increased activity this session and participated well in a 40+ minute session. She was pleased with being able to tolerate regular toilet and access it via wheelchair. Feel she could tolerate CIR and patient agreeable with recommendation.   Follow Up Recommendations  CIR    Equipment Recommendations  Hospital bed;Wheelchair cushion (measurements OT);Wheelchair (measurements OT);3 in 1 bedside comode;Other (comment)    Recommendations for Other Services      Precautions / Restrictions Precautions Precautions: Fall Other Brace/Splint: L wrist in splint, abdominal binder Restrictions Weight Bearing Restrictions: No LUE Weight Bearing: Weight bearing as tolerated RLE Weight Bearing: Weight bearing as tolerated LLE Weight Bearing: Weight bearing as tolerated       Mobility Bed Mobility Overal bed mobility: Needs Assistance;+2 for physical assistance;+ 2 for safety/equipment Bed Mobility: Supine to Sit     Supine to sit: Max assist;+2 for physical assistance;+2 for safety/equipment;HOB elevated     General bed mobility comments: Needed assistance for LE management and trunk elevation.  Transfers Overall transfer  level: Needs assistance Equipment used: Rolling walker (2 wheeled) Transfers: Sit to/from [employer] Sit to Stand: Mod assist;+2 physical assistance;+2 safety/equipment Stand pivot transfers: Mod assist;+2 physical assistance;+2 safety/equipment       General transfer comment: needs assistance to rise, stabilize, pivot    Balance                                   ADL Overall ADL's : Needs assistance/impaired Eating/Feeding: Set up;Sitting   Grooming: Wash/dry hands;Wash/dry face;Set up;Sitting       Lower Body Bathing: Total assistance;Bed level;Sit to/from stand       Lower Body Dressing: Total assistance;Bed level   Toilet Transfer: Moderate assistance;+2 for physical assistance;+2 for safety/equipment;Stand-pivot;Regular Toilet;Grab bars Toilet Transfer Details (indicate cue type and reason): transferred w/c to/from regular toilet via grab bars and assistance of 2 therapists Toileting- Clothing Manipulation and Hygiene: Total assistance;Sit to/from stand       Functional mobility during ADLs: Moderate assistance;+2 for safety/equipment;+2 for physical assistance;Rolling walker General ADL Comments: Patient reports she has been urinating/having bowel movements via pads in the bed because she cannot tolerate bedpan or BSC. Willing to try regular toilet this session via w/c. Patient tolerated regular toilet and was successful with urination and bowel movement while seated on toilet. Needs increased time for all tasks. Patient stayed up in w/c with sitter at bedside at end of session.      Vision                     Perception     Praxis      Cognition   Behavior During Therapy: [employer] for tasks assessed/performed Overall  Cognitive Status: Within Functional Limits for tasks assessed                       Extremity/Trunk Assessment               Exercises     Shoulder Instructions       General Comments       Pertinent Vitals/ Pain       Pain Assessment: 0-10 Pain Score: 9  Pain Location: legs, hips Pain Descriptors / Indicators: Grimacing;Discomfort;Sore;Tender Pain Intervention(s): Limited activity within patient's tolerance;Monitored during session;Repositioned  Home Living                                          Prior Functioning/Environment              Frequency Min 3X/week     Progress Toward Goals  OT Goals(current goals can now be found in the care plan section)  Progress towards OT goals: Progressing toward goals  Acute Rehab OT Goals Patient Stated Goal: agrees to rehab stay  Plan Discharge plan remains appropriate    Co-evaluation    PT/OT/SLP Co-Evaluation/Treatment: Yes Reason for Co-Treatment: For patient/therapist safety PT goals addressed during session: Mobility/safety with mobility OT goals addressed during session: ADL's and self-care      End of Session Equipment Utilized During Treatment: Gait belt;Rolling walker   Activity Tolerance Patient tolerated treatment well   Patient Left in chair;with call bell/phone within reach;with nursing/sitter in room   Nurse Communication Mobility status        Time: [id number] OT Time Calculation (min): 42 min  Charges: OT General Charges $OT Visit: 1 Procedure OT Treatments $Self Care/Home Management : 8-22 mins  [name] Bell A 04/16/2016, 11:31 AM

## 2016-04-16 NOTE — Progress Notes (Signed)
LOS: 67 days   Subjective: Patient resting in bed. Feels like PT went better yesterday and agreeable [facility] if [facility]. Was able to have a few loose stools yesterday, stomach bothering a little today. Denies n/v. Dislikes having to take so much pain medicine, sedates her.   Objective: Vital signs in last 24 hours: Temp:  [98.2 F (36.8 C)-100 F (37.8 C)] 99 F (37.2 C) (06/09 0601) Pulse Rate:  [90-96] 90 (06/09 0601) Resp:  [19-21] 19 (06/09 0601) BP: (109-113)/(45-62) 113/62 mmHg (06/09 0601) SpO2:  [96 %-99 %] 99 % (06/09 0601) Weight:  [99.8 kg (220 lb 0.3 oz)] 99.8 kg (220 lb 0.3 oz) (06/09 0601) Last BM Date: 04/15/16    Physical Exam General: alert and oriented, cooperative. No distress. Resp: CTAB  Cardio: RRR, no M/G/R GI: soft, tender around inferior wound, normal bowel sounds   Assessment/Plan: Jump from hotel/multiple toxic ingestions -Continue on Zoloft, sitter for safety.  S/P ex lap, hepatorraphy, SBR, cholecystectomy, preperitoneal pelvic packing [name] 4/3  Ex lap for acidosis/bleeding 4/4 Thompson Ex lap for bleeding 4/5 [name] S/P ex fix pelvis [name] 4/3 S/P ex lap 4/7 Thompson S/P ex lap 4/10 Thompson S/P sacral screws 4/10 [name] S/P application ABRA, open gastrostomy tube 4/12 [name]  S/P abd closure, trach 4/24 [name] Mult B rib FX/R HPTX, B pulm contusions - IR placed Chest tube drain (03/27/16) for pleural effusion, TCTS following.Pigtail drain placed 5/20 and removed 04/10/16 Multiple toxic ingestions Mult L wrist FXs - splint until f/u with Dr. Melvyn Novasrtmann as OP, can WBAT Pelvic FX - S/P ex fix and angioembolization, S/P sacral screw by Dr. Carola FrostHandy. Bed to chair transfers only for another 4-6 weeks, Pivot with to assist with transfers. ABL anemia FEN - Encourage PO intake, oral nutritional supplementation. Added PO tramadol for pain to try and cut down on narcotic usage. Changed iron supplement to pill form. VTE - SCD's,  Lovenox. [facility]. Could possibly go to inpatient rehab [facility].  Kelly Rayburn PA-S  04/16/2016

## 2016-04-16 NOTE — Progress Notes (Signed)
Physical Therapy Treatment Patient Details Name: [name] Bell MRN: [id number] DOB: [birth year] Today's Date: 04/16/2016    History of Present Illness [name] Bell is a 35 y.o. female.She is obese. PMH unknown.Pt attempted suicide: taped DNR note to chest, drank Drano, then jumped off 5 story building ([address]).S/p 02/09/16 gel foam embolization of bil internal iliac arteries.S/p 02/09/16 ex lap with cholecystectomy, resection SB and mesenteric repair, liver lac repair, application or wound vac.S/p 02/09/16 closed reduction of pelvic ring, external pelvic fixation.Also has right metacarpal fracture in splint. Pt on and off intubation via trach/ and on/off trac collar.  Pelvic x- fixator pins removed at bedside on 03/23/16.     PT Comments    The patient tolerated increased activity today. Mobilized into bathroom via WC. The patient will benefit from  Post acute rehab.   Follow Up Recommendations  CIR     Equipment Recommendations  Wheelchair (measurements PT);Wheelchair cushion (measurements PT);Hospital bed;Other (comment);3in1 (PT)    Recommendations for Other Services Rehab consult     Precautions / Restrictions Precautions Precautions: Fall Required Braces or Orthoses: Other Brace/Splint Other Brace/Splint: L wrist in splint, abdominal binder Restrictions Weight Bearing Restrictions: No LUE Weight Bearing: Weight bearing as tolerated RLE Weight Bearing: Weight bearing as tolerated LLE Weight Bearing: Weight bearing as tolerated Other Position/Activity Restrictions: WBAT B    Mobility  Bed Mobility Overal bed mobility: Needs Assistance;+2 for physical assistance;+ 2 for safety/equipment Bed Mobility: Supine to Sit     Supine to sit: Max assist;+2 for physical assistance;+2 for safety/equipment;HOB elevated     General bed mobility comments: Needed assistance for LE management and trunk elevation.  Transfers Overall transfer level: Needs  assistance Equipment used: Rolling walker (2 wheeled) Transfers: Sit to/from [employer];Stand Pivot Transfers Sit to Stand: Mod assist;+2 physical assistance;+2 safety/equipment Stand pivot transfers: Mod assist;+2 physical assistance;+2 safety/equipment       General transfer comment: needs assistance to rise, stabilize, pivot, more assistance to power up from lower toilet and bed. cues for hand plcement. Pivoted with RW from bed to [facility]. then used rails in the  Bathroom to pivot from [facility] to toilet and back to WC.  Ambulation/Gait                 Stairs            Wheelchair Mobility    Modified Rankin (Stroke Patients Only)       Balance                                    Cognition Arousal/Alertness: Awake/alert Behavior During Therapy: WFL for tasks assessed/performed Overall Cognitive Status: Within Functional Limits for tasks assessed                      Exercises      General Comments        Pertinent Vitals/Pain Pain Assessment: 0-10 Pain Score: 9  Pain Location: legs and hips Pain Descriptors / Indicators: Discomfort;Grimacing;Guarding Pain Intervention(s): Premedicated before session;Repositioned;Monitored during session;Limited activity within patient's tolerance    Home Living                      Prior Function            PT Goals (current goals can now be found in the care plan section) Acute Rehab PT Goals Patient Stated Goal:  agrees to rehab stay Progress towards PT goals: Progressing toward goals    Frequency  Min 5X/week    PT Plan Current plan remains appropriate    Co-evaluation PT/OT/SLP Co-Evaluation/Treatment: Yes Reason for Co-Treatment: For patient/therapist safety;Complexity of the patient's impairments (multi-system involvement) PT goals addressed during session: Mobility/safety with mobility OT goals addressed during session: ADL's and self-care     End of Session Equipment Utilized  During Treatment: Gait belt Activity Tolerance: Patient tolerated treatment well Patient left: in chair;with call Bell/phone within reach;with nursing/sitter in room     Time: 1012-1057 PT Time Calculation (min) (ACUTE ONLY): 45 min  Charges:  $Therapeutic Activity: 23-37 mins                    G Codes:      Rada Hay 04/16/2016, 12:09 PM Blanchard Kelch PT [phone]

## 2016-04-16 NOTE — Progress Notes (Signed)
PT Cancellation Note  Patient Details Name: [name] Bell MRN: [id number] DOB: 11/27/1980   Cancelled Treatment:    Reason Eval/Treat Not Completed: Other (comment) (Patient states that she is having abdominal trouble and is not ready for therapies. [facility]. will check back 1 more time this AM. recommend  CIR consult.)   Rada HayHill, Dequan [facility] Elizabeth 04/16/2016, 9:32 AM Blanchard KelchKaren Kazumi Lachney PT [phone]

## 2016-04-16 NOTE — Clinical Social Work Psych Note (Signed)
Medical Director continuing to assist with disposition.  [facility] due to need [facility].  Director aware and will continue to assist.  Psych CSW continuing to follow.  Trauma service updated.  Vickii PennaGina Taiz Bickle, LCSW [phone]  [facility] Psychiatric Service Line Licensed Clinical Social Worker

## 2016-04-16 NOTE — Progress Notes (Signed)
OT Cancellation Note  Patient Details Name: [name] Bell MRN: [id number] DOB: 1981-10-03   Cancelled Treatment:    Reason Eval/Treat Not Completed: Other (comment) -- Attempted OT treatment. Patient reports she "is caught off guard" and is "not ready for therapy right now." States she is having bowel movement issues. OT [facility], but patient reports she just "goes on the pads" and "it's a long story." Patient asked therapist to return at a later time. Will attempt at a later time.  Adel Burch A 04/16/2016, 9:32 AM

## 2016-04-17 ENCOUNTER — Inpatient Hospital Stay (HOSPITAL_COMMUNITY): Payer: Medicaid Other

## 2016-04-17 NOTE — Progress Notes (Signed)
LOS: 68 days   Subjective: Patient resting in bed.  Complains of worse L sided pelvic and hip pain after a transfer yesterday.     Objective: Vital signs in last 24 hours: Temp:  [98.4 F (36.9 C)-99.8 F (37.7 C)] 99.8 F (37.7 C) (06/10 0509) Pulse Rate:  [81-95] 81 (06/10 0509) Resp:  [18-19] 18 (06/10 0509) BP: (114-121)/(58-71) 121/71 mmHg (06/10 0509) SpO2:  [95 %-98 %] 95 % (06/10 0509) Weight:  [99.2 kg (218 lb 11.1 oz)] 99.2 kg (218 lb 11.1 oz) (06/10 0500) Last BM Date: 04/15/16    Physical Exam General: alert and oriented, cooperative. No distress. GI: soft, tender around inferior wound, normal bowel sounds MSK: L hip TTP, decreased strength in lower extremity   Assessment/Plan: Jump from hotel/multiple toxic ingestions -Continue on Zoloft, sitter for safety.  S/P ex lap, hepatorraphy, SBR, cholecystectomy, preperitoneal pelvic packing [name] 4/3  Ex lap for acidosis/bleeding 4/4 Thompson Ex lap for bleeding 4/5 [name] S/P ex fix pelvis [name] 4/3 S/P ex lap 4/7 Thompson S/P ex lap 4/10 Thompson S/P sacral screws 4/10 [name] S/P application ABRA, open gastrostomy tube 4/12 [name]  S/P abd closure, trach 4/24 [name] Mult B rib FX/R HPTX, B pulm contusions - IR placed Chest tube drain (03/27/16) for pleural effusion, TCTS following.Pigtail drain placed 5/20 and removed 04/10/16 Multiple toxic ingestions Mult L wrist FXs - splint until f/u with Dr. Melvyn Novasrtmann as OP, can WBAT Pelvic FX - S/P ex fix and angioembolization, S/P sacral screw by Dr. Carola FrostHandy. Bed to chair transfers only for another 4-6 weeks, Pivot with to assist with transfers.  Pt with increased L hip pain after transfer yesterday.  Will get repeat pelvic film  ABL anemia FEN - Encourage PO intake, oral nutritional supplementation. Added PO tramadol for pain to try and cut down on narcotic usage. PO iron supplement. VTE - SCD's, Lovenox. [facility]. Could possibly [facility] inpatient rehab  [facility].  Vanita PandaAlicia C Jessenia Filippone, MD  Colorectal and General Surgery [facility]

## 2016-04-18 NOTE — Progress Notes (Signed)
LOS: 69 days   Subjective: Patient resting in bed.  Feeling better today.     Objective: Vital signs in last 24 hours: Temp:  [98.2 F (36.8 C)-100.1 F (37.8 C)] 98.7 F (37.1 C) (06/11 0500) Pulse Rate:  [86-89] 87 (06/11 0500) Resp:  [18-19] 18 (06/11 0500) BP: (120-135)/(63-75) 135/75 mmHg (06/11 0500) SpO2:  [94 %-97 %] 96 % (06/11 0500) Last BM Date: 04/15/16    Physical Exam General: alert and oriented, cooperative. No distress. GI: soft, normal bowel sounds    Assessment/Plan: Jump from hotel/multiple toxic ingestions -Continue on Zoloft, sitter for safety.  S/P ex lap, hepatorraphy, SBR, cholecystectomy, preperitoneal pelvic packing [name] 4/3  Ex lap for acidosis/bleeding 4/4 Thompson Ex lap for bleeding 4/5 [name] S/P ex fix pelvis [name] 4/3 S/P ex lap 4/7 Thompson S/P ex lap 4/10 Thompson S/P sacral screws 4/10 [name] S/P application ABRA, open gastrostomy tube 4/12 [name]  S/P abd closure, trach 4/24 [name] Mult B rib FX/R HPTX, B pulm contusions - IR placed Chest tube drain (03/27/16) for pleural effusion, TCTS following.Pigtail drain placed 5/20 and removed 04/10/16 Multiple toxic ingestions Mult L wrist FXs - splint until f/u with Dr. Melvyn Novasrtmann as OP, can WBAT Pelvic FX - S/P ex fix and angioembolization, S/P sacral screw by Dr. Carola FrostHandy. Bed to chair transfers only for another 4-6 weeks, Pivot with to assist with transfers.  Pt with increased L hip pain after transfer yesterday.  Will get repeat pelvic film  ABL anemia FEN - Encourage PO intake, oral nutritional supplementation. Added PO tramadol for pain to try and cut down on narcotic usage. PO iron supplement. VTE - SCD's, Lovenox. [facility]. [facility]  Vanita PandaAlicia C Cindy Fullman, MD  Colorectal and General Surgery [facility]

## 2016-04-19 ENCOUNTER — Inpatient Hospital Stay (HOSPITAL_COMMUNITY): Payer: Medicaid Other

## 2016-04-19 LAB — COMPREHENSIVE METABOLIC PANEL
ALBUMIN: 2.3 g/dL — AB (ref 3.5–5.0)
ALT: 20 U/L (ref 14–54)
AST: 21 U/L (ref 15–41)
Alkaline Phosphatase: 112 U/L (ref 38–126)
Anion gap: 13 (ref 5–15)
BUN: 8 mg/dL (ref 6–20)
CHLORIDE: 100 mmol/L — AB (ref 101–111)
CO2: 23 mmol/L (ref 22–32)
CREATININE: 0.38 mg/dL — AB (ref 0.44–1.00)
Calcium: 9.3 mg/dL (ref 8.9–10.3)
GFR calc Af Amer: >60 mL/min (ref >60)
GLUCOSE: 100 mg/dL — AB (ref 65–99)
POTASSIUM: 2.9 mmol/L — AB (ref 3.5–5.1)
Sodium: 136 mmol/L (ref 135–145)
Total Bilirubin: 2 mg/dL — ABNORMAL HIGH (ref 0.3–1.2)
Total Protein: 7.4 g/dL (ref 6.5–8.1)

## 2016-04-19 LAB — CBC
HEMATOCRIT: 36.4 % (ref 36.0–46.0)
Hemoglobin: 11.3 g/dL — ABNORMAL LOW (ref 12.0–15.0)
MCH: 27.6 pg (ref 26.0–34.0)
MCHC: 31 g/dL (ref 30.0–36.0)
MCV: 89 fL (ref 78.0–100.0)
PLATELETS: 273 10*3/uL (ref 150–400)
RBC: 4.09 MIL/uL (ref 3.87–5.11)
RDW: 14.7 % (ref 11.5–15.5)
WBC: 13.7 10*3/uL — AB (ref 4.0–10.5)

## 2016-04-19 MED ORDER — ENSURE ENLIVE PO LIQD
237.0000 mL | ORAL | Status: DC
  Administered 2016-04-20: 237 mL via ORAL

## 2016-04-19 MED ORDER — SORBITOL 70 % SOLN
960.0000 mL | TOPICAL_OIL | Freq: Once | ORAL | Status: AC
  Administered 2016-04-19: 960 mL via RECTAL
  Filled 2016-04-19: qty 240

## 2016-04-19 MED ORDER — BOOST / RESOURCE BREEZE PO LIQD
1.0000 | Freq: Two times a day (BID) | ORAL | Status: DC
  Administered 2016-04-19 – 2016-04-20 (×2): 1 via ORAL

## 2016-04-19 NOTE — Progress Notes (Signed)
Patient refused to eat any lunch and also have refused most medication today.

## 2016-04-19 NOTE — Clinical Social Work Note (Addendum)
CSW received phone call from Britta MccreedyBarbara [phone] [facility] she stated their medical director is reviewing patient's case based on everything that was faxed to them.  Britta MccreedyBarbara will contact CSW if they need anything else.  3:00pm  CSW received phone call from Bing QuarryBarbera [phone], stating they are requesting extra information regarding patient.  CSW informed bedside nurse who contacted physician with requested clinical information.  Ervin KnackEric R. Laquida Cotrell, MSW, Theresia MajorsLCSWA [phone] 04/19/2016 2:27 PM

## 2016-04-19 NOTE — Progress Notes (Signed)
OT Cancellation Note  Patient Details Name: [name] Bell MRN: [id number] DOB: [birth year]   Cancelled Treatment:    Reason Eval/Treat Not Completed: Patient declined, no reason specified  Evern BioMayberry, Maurita Havener Lynn 04/19/2016, 3:56 PM

## 2016-04-19 NOTE — Clinical Social Work Psych Note (Signed)
[facility]  [facility] requesting additional documentation and detailed dc summary specifying patient needs for each medical complaint.  Trauma service notified and agreeable to complete said documentation.  Psych CSW will continue to assist with disposition.  Vickii PennaGina Kaison Mcparland, LCSW [phone]  [facility] Psychiatric Service Line Licensed Clinical Social Worker

## 2016-04-19 NOTE — Progress Notes (Signed)
PT Cancellation Note  Patient Details Name: [name] Bell MRN: [id number] DOB: 1980/11/28   Cancelled Treatment:    Reason Eval/Treat Not Completed: Patient refusing PT. Pt notified that arrangements were made for female therapist to work with her. She then stated that she did not want to have any PT today at all. Discussed need for mobilization and benefits of working with PT, pt verbalized understanding and continued to refuse. Discussed with nursing.    Christiane HaBenjamin J. Annalucia Laino, PT, CSCS Pager [phone] Office [phone]  04/19/2016, 10:19 AM

## 2016-04-19 NOTE — Progress Notes (Signed)
PT Cancellation Note  Patient Details Name: [name] Bell MRN: [id number] DOB: 1981/08/03   Cancelled Treatment:    Reason Eval/Treat Not Completed: Patient refused, states that she has requested to only have female staff work with her. Attempting to arrange for alternative staff.     Christiane HaBenjamin J. Maud Rubendall, PT, CSCS Pager [phone] Office [phone]  04/19/2016, 10:03 AM

## 2016-04-19 NOTE — Progress Notes (Signed)
LOS: 70 days   Subjective: Patient complaining of constipation and would like an enema. Still having pain with eating, even with water. She is trying to make sure she gets enough protein despite decreased appetite. Concerned about [facility] distance from home.    Objective: Vital signs in last 24 hours: Temp:  [99 F (37.2 C)-100.3 F (37.9 C)] 99 F (37.2 C) (06/12 0647) Pulse Rate:  [95-98] 98 (06/12 0647) Resp:  [18-20] 18 (06/12 0647) BP: (126-137)/(74-78) 136/78 mmHg (06/12 0647) SpO2:  [96 %-98 %] 98 % (06/12 0647) Last BM Date: 04/15/16   Imaging Results EXAM: DG HIP (WITH OR WITHOUT PELVIS) 1V*L*  COMPARISON: 04/12/2016  FINDINGS: Two views of the left hip submitted. Again noted displaced fracture bilateral superior and inferior pubic ramus. Mild widening of pubic symphysis again noted. No hip fracture or dislocation. Again noted 2 metallic fixation screws across bilateral SI joints.  IMPRESSION: No hip fracture or subluxation. Again noted mild displaced fractures bilateral superior and inferior pubic ramus. No significant change.   Electronically Signed  By: Natasha MeadLiviu Pop M.D.  On: 04/17/2016 14:22  Physical Exam General:A&Ox3. Cooperative. No distress. Resp:CTAB. Cardio:RRR, no M/G/R GI: soft, moderately ttp. Normal bowel sounds. Midline wound dressed and clean.  Assessment/Plan: Jump from hotel/multiple toxic ingestions -Continue on Zoloft, sitter for safety.  S/P ex lap, hepatorraphy, SBR, cholecystectomy, preperitoneal pelvic packing [name] 4/3  Ex lap for acidosis/bleeding 4/4 Thompson Ex lap for bleeding 4/5 [name] S/P ex fix pelvis [name] 4/3 S/P ex lap 4/7 Thompson S/P ex lap 4/10 Thompson S/P sacral screws 4/10 [name] S/P application ABRA, open gastrostomy tube 4/12 [name]  S/P abd closure, trach 4/24 [name] Mult B rib FX/R HPTX, B pulm contusions - IR placed Chest tube drain (03/27/16) for pleural effusion, TCTS following.Pigtail drain placed  5/20 and removed 04/10/16 Multiple toxic ingestions Mult L wrist FXs - splint until f/u with Dr. Melvyn Novasrtmann as OP, can WBAT Pelvic FX - S/P ex fix and angioembolization, S/P sacral screw by Dr. Carola FrostHandy. Bed to chair transfers only for another 4-6 weeks, Pivot with to assist with transfers. Pt with increased L hip pain after transfer on 6/9. Pelvic film on 6/10 showed no change. ABL anemia FEN - Calorie count. Enema ordered. VTE - SCD's, Lovenox. Dispo [facility]. [facility]   Kelly Rayburn PA-S  04/19/2016

## 2016-04-19 NOTE — Progress Notes (Signed)
Nutrition Follow-up  DOCUMENTATION CODES:   Obesity unspecified  INTERVENTION:   -Decrease Ensure Enlive po to daily, each supplement provides 350 kcal and 20 grams of protein -Boost Breeze po BID, each supplement provides 250 kcal and 9 grams of protein -Snacks TID (yogurt) -48 hour calorie count ordered. Will follow-up on 04/20/16 for results  NUTRITION DIAGNOSIS:   Increased nutrient needs related to  (trauma) as evidenced by estimated needs.  Ongoing  GOAL:   Patient will meet greater than or equal to 90% of their needs  Unmet  MONITOR:   PO intake, Supplement acceptance, Labs, Weight trends, Skin, I & O's  REASON FOR ASSESSMENT:   Consult Calorie Count  ASSESSMENT:   Pt from [place] with no past medical hx admitted after suicide attempt by ingestion of Drano, 2 bottles of acetaminophen, nyquil, and possibly 6 alprazolam pills and jumping from 5th floor balcony of hotel. 4/3 pt is s/p exp lap, hepatorraphy, SBR, cholecystectomy, preperitoneal pelvic packing, ex fix pelvis, R HPTX, Bil pulmonary contusions. Plan for repeat OR 4/5 for another possible SBR. Abdomen remains open.   RD received consult for calorie count.   Spoke with pt at bedside. She reports her appetite is very poor and suspects it will take a while for it to return. She reveals that over the past few days she has been eating bites of food, mainly fruit. She estimates she has been consuming one Ensure daily; she is not fond of the supplement but knows that nutrition is important to help her progress. Offered other supplements on the formulary; pt agreeable to [facility], but "not today". RD will adjust supplement regimen for wider variety in order to promote acceptance of supplements.   Pt complains of abdominal pain and tooth pain as her main barriers to improved PO intake. She shares with this RD that she has a "messed up tooth" on her left side and directs all of her chewing to the right side. Tooth pain is  also aggravated by temperature extremes. She reports that she had "stomach issues" PTA and was consuming yogurt to assist with this. RD will also order yogurt between meals to optimize intake.   Pt with overall flat affect. Emphasized importance of good nutrition to assist with medical progression.   Calorie count envelope was placed by staff this AM; no tickets in envelope to review at this time. Per RN, pt refused lunch and most medications today. Pt also refused therapy today. Meal completion 0% for breakfast and lunch. Noted pt was downgraded to a full liquid diet for lunch in attempt to improve PO intake in light of abdominal pain.   CSW following. [facility] due [facility] requirement. CSW working with Wellsite geologistmedical director for most appropriate discharge disposition- [facility]  Labs reviewed.   Diet Order:  Diet full liquid Room service appropriate?: Yes; Fluid consistency:: Thin  Skin:  Reviewed, no issues (incisions)  Last BM:  04/10/16  Height:   Ht Readings from Last 1 Encounters:  03/18/16 5\' 11"  (1.803 m)    Weight:   Wt Readings from Last 1 Encounters:  04/17/16 218 lb 11.1 oz (99.2 kg)    Ideal Body Weight:  70.4 kg  BMI:  Body mass index is 30.52 kg/(m^2).  Estimated Nutritional Needs:   Kcal:  1900-2100  Protein:  105-120 grams  Fluid:  1.9-2.1 L  EDUCATION NEEDS:   No education needs identified at this time  1000 West Boise CircleJenifer  Rosalie Doctor, RD, LDN, CDE Pager: [phone] After hours Pager: [phone]

## 2016-04-20 ENCOUNTER — Inpatient Hospital Stay (HOSPITAL_COMMUNITY): Payer: Medicaid Other

## 2016-04-20 LAB — PREALBUMIN: Prealbumin: 3.9 mg/dL — ABNORMAL LOW (ref 18–38)

## 2016-04-20 MED ORDER — GUAIFENESIN 200 MG PO TABS
200.0000 mg | ORAL_TABLET | Freq: Three times a day (TID) | ORAL | Status: DC | PRN
  Administered 2016-04-20 – 2016-04-23 (×2): 200 mg via ORAL
  Filled 2016-04-20 (×3): qty 1

## 2016-04-20 MED ORDER — ENSURE ENLIVE PO LIQD
237.0000 mL | Freq: Every day | ORAL | Status: DC
  Administered 2016-04-20 – 2016-05-07 (×23): 237 mL via ORAL

## 2016-04-20 NOTE — Progress Notes (Signed)
Patient ID: [name] Bell, female   DOB: May 30, 1981, 35 y.o.   MRN: [id number]  We were asked to comment on plans for three specific aspects of [name] Bell[facility] care [facility]:  Nutrition: [name] Bell's prealbumin is 3.9 today, confirming our suspicion that she is not meeting her caloric needs. This is primarily due to post-prandial pain that remains unexplained or ameliorated at this point. I spoke to her about this today and explained the problem. I told her that she could force herself to eat despite the anorexia and pain, we could reinstitute tube feedings (which still cause the pain), or we could start TPN (an inferior solution). She said that she could take Ensure without causing pain but didn't like it because of the chalky taste and said she would prefer to just make herself take PO's. She has agreed to use Ensure as her baseline minimal caloric intake and to supplement with other foods as able. As such I have increased her Ensure to 6x/day which will meet her minimum calorie and protein needs. We will monitor to make sure this is doable for her.  Airway: Her tracheostomy tube has been out for over a week without any respiratory compromise. Her tracheostomy site requires only a dry dressing placed over it and changed daily. I expect that it will have healed sufficiently by [facility] that it will not need any treatment at all. Along related lines, her pleural effusion is resolving and we don't plan to follow it radiographically unless it causes symptoms such at pleuritic chest pain or shortness of breath.  Wounds: Her midline wound is healing well; it is nearly skin level and is being treated with twice daily antibiotic ointment and a dry dressing. Time to epithelization is variable and will depend on her nutritional status but I anticipate it will be fully healed within 6 weeks. The pin site erosions that were being packed have healed and need no treatment.    Freeman CaldronMichael J. Jamarrion Budai, PA-C Pager:  [phone] General Trauma PA Pager: [phone]

## 2016-04-20 NOTE — Progress Notes (Signed)
Physical Therapy Treatment Patient Details Name: [name] Bell MRN: [id number] DOB: 07-18-1981 Today's Date: 04/20/2016    History of Present Illness [name] Bell is a 35 y.o. female.She is obese. PMH unknown.Pt attempted suicide: taped DNR note to chest, drank Drano, then jumped off 5 story building ([address]).S/p 02/09/16 gel foam embolization of bil internal iliac arteries.S/p 02/09/16 ex lap with cholecystectomy, resection SB and mesenteric repair, liver lac repair, application or wound vac.S/p 02/09/16 closed reduction of pelvic ring, external pelvic fixation.Also has right metacarpal fracture in splint. Pt on and off intubation via trach/ and on/off trac collar.  Pelvic x- fixator pins removed at bedside on 03/23/16.     PT Comments    Pt was up with help from PT and OT together, in pain and willing to work in a very limited way.  However, pt was instructed on her plan for therapy including CIR which she is wanting to do per her report.  Family in to hear as well, a sister who is very involved with pt.  Will continue to follow her acutely and hopefully resume working on transfers to chair and BR.  Follow Up Recommendations  CIR     Equipment Recommendations  Wheelchair (measurements PT);Wheelchair cushion (measurements PT);Hospital bed;Other (comment);3in1 (PT)    Recommendations for Other Services Rehab consult     Precautions / Restrictions Precautions Precautions: Fall Precaution Comments: tubes are out Required Braces or Orthoses: Other Brace/Splint Other Brace/Splint: L wrist in splint, abdominal binder Restrictions Weight Bearing Restrictions: Yes LUE Weight Bearing: Weight bearing as tolerated RLE Weight Bearing: Weight bearing as tolerated (for transfers only) LLE Weight Bearing: Weight bearing as tolerated (for transfers only) Other Position/Activity Restrictions: WBAT on legs only to stand to pivot to chair    Mobility  Bed Mobility Overal bed  mobility: Needs Assistance;+2 for physical assistance;+ 2 for safety/equipment Bed Mobility: Rolling;Sidelying to Sit;Sit to Sidelying Rolling: Mod assist Sidelying to sit: Mod assist;+2 for physical assistance;+2 for safety/equipment;HOB elevated Supine to sit: Mod assist;+2 for physical assistance;+2 for safety/equipment Sit to supine: Mod assist;+2 for physical assistance;Min assist;+2 for safety/equipment Sit to sidelying: Mod assist;+2 for physical assistance;+2 for safety/equipment General bed mobility comments: Needed assistance for LE management and trunk elevation.  Transfers                 General transfer comment: Did not occur, pt refused this session due to increased pain  Ambulation/Gait             General Gait Details: not permitted   Stairs            Wheelchair Mobility    Modified Rankin (Stroke Patients Only)       Balance Overall balance assessment: Needs assistance Sitting-balance support: No upper extremity supported;Feet supported Sitting balance-[name]: Fair Sitting balance - Comments: min guard while seated EOB for UB bathing Postural control: Posterior lean Standing balance support:  (declined)                        Cognition Arousal/Alertness: Awake/alert Behavior During Therapy: WFL for tasks assessed/performed Overall Cognitive Status: Within Functional Limits for tasks assessed (pt requires increased encouragement to participate in therapy)                      Exercises Total Joint Exercises Ankle Circles/Pumps: AROM;Both;10 reps;Supine Quad Sets: AROM;Both;10 reps Heel Slides: AAROM;Both;10 reps    General Comments General comments (skin  integrity, edema, etc.): Pt was willing to work with PT and OT after declining therapy earlier and yesterday.  Will continue to work on sitting but declined standing, although she has worked on this in previous notes.  CIR is hopefully her destination.       Pertinent Vitals/Pain Pain Assessment: 0-10 Pain Score: 5  Faces Pain Scale: Hurts whole lot Pain Location: abdomen, right LE, RUE, area under right breast Pain Descriptors / Indicators: Aching;Sore;Discomfort;Guarding;Grimacing Pain Intervention(s): Limited activity within patient's tolerance;Monitored during session;Repositioned    Home Living                      Prior Function            PT Goals (current goals can now be found in the care plan section) Acute Rehab PT Goals Patient Stated Goal: agrees to rehab stay Progress towards PT goals: Progressing toward goals    Frequency  Min 5X/week    PT Plan Current plan remains appropriate    Co-evaluation PT/OT/SLP Co-Evaluation/Treatment: Yes Reason for Co-Treatment: For patient/therapist safety;Complexity of the patient's impairments (multi-system involvement) PT goals addressed during session: Mobility/safety with mobility;Strengthening/ROM OT goals addressed during session: ADL's and self-care;Other (comment) (functional mobility)     End of Session   Activity Tolerance: Patient tolerated treatment well;Patient limited by pain Patient left: in bed;with call bell/phone within reach;with family/visitor present;with nursing/sitter in room     Time: 0935-1005 PT Time Calculation (min) (ACUTE ONLY): 30 min  Charges:  $Therapeutic Exercise: 8-22 mins                    G Codes:      Ivar DrapeStout, Whitlee Sluder E 04/20/2016, 10:16 AM    Samul Dadauth Kindel Rochefort, PT MS Acute Rehab Dept. Number: [facility] [id number] and [facility] [phone]

## 2016-04-20 NOTE — Progress Notes (Signed)
LOS: 71 days   Subjective: Patient told me she does not want to participate in PT today. Feels like she is pushing too hard. Still having abdominal pain with eating and decreased appetite.   Objective: Vital signs in last 24 hours: Temp:  [98.4 F (36.9 C)-99.9 F (37.7 C)] 98.4 F (36.9 C) (06/13 0514) Pulse Rate:  [86-97] 91 (06/13 0514) Resp:  [18] 18 (06/13 0514) BP: (130-136)/(68-74) 130/74 mmHg (06/13 0514) SpO2:  [92 %-99 %] 92 % (06/13 0514) Weight:  [82.555 kg (182 lb)] 82.555 kg (182 lb) (06/13 0514) Last BM Date: 04/20/16   Laboratory  CBC  Recent Labs  04/19/16 1553  WBC 13.7*  HGB 11.3*  HCT 36.4  PLT 273   BMET  Recent Labs  04/19/16 1553  NA 136  K 2.9*  CL 100*  CO2 23  GLUCOSE 100*  BUN 8  CREATININE 0.38*  CALCIUM 9.3   Imaging Results EXAM: CHEST 1 VIEW  COMPARISON: 04/19/2016. 04/08/2016.  FINDINGS: Patient is rotated to the left. Mediastinum hilar structures stable. Heart size stable. Stable opacification of the right lung is noted. Stable right pleural effusion noted. Stable small pleural air collections. a right chest wall subcutaneous emphysema again noted. Multiple displaced right rib fractures again noted.  IMPRESSION: 1. Stable opacification right lung consistent with contusion and/or atelectasis again noted. Stable right pleural effusion and small pleural air collections again noted noted. 2. Multiple displaced right rib fractures with right chest wall subcutaneous emphysema again noted.   Electronically Signed  By: Maisie Fushomas Register  On: 04/20/2016 07:45  EXAM: JUDET PELVIS - 3+ VIEW  COMPARISON: 04/12/2016  FINDINGS: Two screws are across the sacrum and bilateral SI joints unchanged in position from the prior study. The SI joints are in satisfactory alignment. Bilateral superior inferior pubic rami fractures show partial healing and mild displacement. Both hip joints are normal.  Contrast in  the colon from prior CT. No bowel obstruction  IMPRESSION: Screw fixation of the sacrum and bilateral SI joints unchanged from the prior study.   Electronically Signed  By: Marlan Palauharles Clark M.D.  On: 04/20/2016 07:44  Physical Exam General: alert, no distress Resp: CTAB, not labored Cardio:RRR, no M/G/R GI: soft, nondistended, ttp diffusely. Midline wound clean and dressed. Normal bowel sounds   Assessment/Plan: Jump from hotel/multiple toxic ingestions -Continue on Zoloft, sitter for safety.  S/P ex lap, hepatorraphy, SBR, cholecystectomy, preperitoneal pelvic packing [name] 4/3  Ex lap for acidosis/bleeding 4/4 Thompson Ex lap for bleeding 4/5 [name] S/P ex fix pelvis [name] 4/3 S/P ex lap 4/7 Thompson S/P ex lap 4/10 Thompson S/P sacral screws 4/10 [name] S/P application ABRA, open gastrostomy tube 4/12 [name]  S/P abd closure, trach 4/24 [name] Mult B rib FX/R HPTX, B pulm contusions - IR placed Chest tube drain (03/27/16) for pleural effusion, TCTS following.Pigtail drain placed 5/20 and removed 04/10/16 Multiple toxic ingestions Mult L wrist FXs - splint until f/u with Dr. Melvyn Novasrtmann as OP, can WBAT Pelvic FX - S/P ex fix and angioembolization, S/P sacral screw by Dr. Carola FrostHandy. Bed to chair transfers only for another 4-6 weeks, Pivot with to assist with transfers. ABL anemia FEN - Calorie count. Encourage PO intake. VTE - SCD's, Lovenox. [facility]. [facility].   Jeramyah Goodpasture Rayburn PA-S  04/20/2016

## 2016-04-20 NOTE — Progress Notes (Signed)
Patient stated she wanted to wait to change her abdominal dressing and stated she did not need the scheduled tramadol this morning.

## 2016-04-20 NOTE — Progress Notes (Signed)
PT Cancellation Note  Patient Details Name: [name] Bell MRN: [id number] DOB: [birth year]   Cancelled Treatment:    Reason Eval/Treat Not Completed: Patient declined, no reason specified (Asked PT to come back later, will try as time allows).   Ivar DrapeStout, Elvira Langston E 04/20/2016, 8:44 AM    Samul Dadauth Chene Kasinger, PT MS Acute Rehab Dept. Number: [facility] [id number] and [facility] [phone]

## 2016-04-20 NOTE — Progress Notes (Signed)
Occupational Therapy Treatment Patient Details Name: [name] Bell MRN: [id number] DOB: 1981/10/21 Today's Date: 04/20/2016    History of present illness [name] Bell is a 35 y.o. female.She is obese. PMH unknown.Pt attempted suicide: taped DNR note to chest, drank Drano, then jumped off 5 story building ([address]).S/p 02/09/16 gel foam embolization of bil internal iliac arteries.S/p 02/09/16 ex lap with cholecystectomy, resection SB and mesenteric repair, liver lac repair, application or wound vac.S/p 02/09/16 closed reduction of pelvic ring, external pelvic fixation.Also has right metacarpal fracture in splint. Pt on and off intubation via trach/ and on/off trac collar.  Pelvic x- fixator pins removed at bedside on 03/23/16.    OT comments  Patient making progress with acute OT. Pt met originally set LTGs, therefore updated goals. Continue to recommend CIR for additional rehab prior to patient discharging home. Pt requires increased encouragement to participate, but with education and max encouragement, pt willing to participate in PT/OT co-treatment today. Pt limited by pain and anxiety. Pt will benefit from consistency with therapists.    Follow Up Recommendations  CIR;Supervision/Assistance - 24 hour    Equipment Recommendations  Hospital bed;Wheelchair cushion (measurements OT);Wheelchair (measurements OT);3 in 1 bedside comode;Other (comment)    Recommendations for Other Services Other (comment) (neuropsych)   Precautions / Restrictions Precautions Precautions: Fall Precaution Comments: tubes are out Required Braces or Orthoses: Other Brace/Splint Other Brace/Splint: L wrist in splint, abdominal binder Restrictions Weight Bearing Restrictions: Yes LUE Weight Bearing: Weight bearing as tolerated RLE Weight Bearing: Weight bearing as tolerated (for transfers only) LLE Weight Bearing: Weight bearing as tolerated (for transfers only) Other Position/Activity Restrictions:  WBAT on legs only to stand to pivot to chair    Mobility Bed Mobility Overal bed mobility: Needs Assistance;+2 for physical assistance;+ 2 for safety/equipment Bed Mobility: Rolling;Sidelying to Sit;Sit to Sidelying Rolling: Mod assist Sidelying to sit: Mod assist;+2 for physical assistance;+2 for safety/equipment;HOB elevated Supine to sit: Mod assist;+2 for physical assistance;+2 for safety/equipment Sit to supine: Mod assist;+2 for physical assistance;Min assist;+2 for safety/equipment Sit to sidelying: Mod assist;+2 for physical assistance;+2 for safety/equipment General bed mobility comments: Needed assistance for LE management and trunk elevation.  Transfers General transfer comment: Did not occur, pt refused this session due to increased pain    Balance Overall balance assessment: Needs assistance Sitting-balance support: No upper extremity supported;Feet supported Sitting balance-[name]: Fair Sitting balance - Comments: min guard while seated EOB for UB bathing Postural control: Posterior lean Standing balance support:  (declined)   ADL Overall ADL's : Needs assistance/impaired Eating/Feeding: Set up;Bed level Eating/Feeding Details (indicate cue type and reason): Pt unable to tolerate sitting EOB or in chair long enough for self feeding this session Grooming: Wash/dry hands;Wash/dry face;Set up;Sitting   Upper Body Bathing: Minimal assitance;Sitting Upper Body Bathing Details (indicate cue type and reason): EOB with increased encouragement  Lower Body Bathing: Total assistance;Bed level Lower Body Bathing Details (indicate cue type and reason): Pt refused sit to/from stand and unable to sit EOB long enough to perform LB bathing Upper Body Dressing : Moderate assistance;Sitting   Lower Body Dressing: Total assistance;Bed level     Toilet Transfer Details (indicate cue type and reason): did not occur Toileting- Clothing Manipulation and Hygiene: Total assistance;Bed  level         General ADL Comments: Pt requires increased encouragement to participate. Pt limited this session by increased pain and anxiety.      Cognition   Behavior During Therapy: [facility] for tasks  assessed/performed Overall Cognitive Status: Within Functional Limits for tasks assessed (pt requires increased encouragement to participate in therapy)      Exercises Total Joint Exercises Ankle Circles/Pumps: AROM;Both;10 reps;Supine Quad Sets: AROM;Both;10 reps Heel Slides: AAROM;Both;10 reps           Pertinent Vitals/ Pain       Pain Assessment: 0-10 Pain Score: 5  Faces Pain Scale: Hurts whole lot Pain Location: abdomen, right LE, RUE, area under right breast Pain Descriptors / Indicators: Aching;Sore;Discomfort;Guarding;Grimacing Pain Intervention(s): Limited activity within patient's tolerance;Monitored during session;Repositioned   Frequency Min 3X/week     Progress Toward Goals  OT Goals(current goals can now befound in the care plan section)  Progress towards OT goals: Goals met and updated - see care plan  Acute Rehab OT Goals Patient Stated Goal: agrees to rehab stay OT Goal Formulation: With patient Time For Goal Achievement: 05/04/16 Potential to Achieve Goals: Fair ADL Goals Pt Will Perform Lower Body Bathing: with mod assist;sit to/from stand Pt Will Transfer to Toilet: with mod assist;bedside commode;stand pivot transfer Pt/caregiver will Perform Home Exercise Program: Increased ROM;With Supervision;With written HEP provided Additional ADL Goal #4: Pt will be supervision for bed mobility and supervision to maintain dynamic sitting balance EOB during ADL  Plan Discharge plan remains appropriate    Co-evaluation    PT/OT/SLP Co-Evaluation/Treatment: Yes Reason for Co-Treatment: For patient/therapist safety;Complexity of the patient's impairments (multi-system involvement) PT goals addressed during session: Mobility/safety with  mobility;Strengthening/ROM OT goals addressed during session: ADL's and self-care;Other (comment) (functional mobility)         Activity Tolerance Patient tolerated treatment well;Patient limited by pain   Patient Left in bed;with call bell/phone within reach;with family/visitor present     Time: 0930-1000 OT Time Calculation (min): 30 min  Charges: OT General Charges $OT Visit: 1 Procedure OT Treatments $Self Care/Home Management : 8-22 mins  Chrys Racer , MS, OTR/L, CLT Pager: [phone]  04/20/2016, 10:15 AM

## 2016-04-20 NOTE — Progress Notes (Signed)
Patient refused all night medicines and dressing change, offered to bring medicine and do dressing change later on but continued to refused.

## 2016-04-20 NOTE — Progress Notes (Addendum)
Calorie Count Note  48 hour calorie count ordered.  Diet: Full Liquid Supplements: Ensure Enlive po daily, each supplement provides 350 kcal and 20 grams of protein; Boost Breeze po BID, each supplement provides 250 kcal and 9 grams of protein  Day 1  Breakfast: 0% Lunch: refused (0%) Dinner: cream of chicken soup (117 kcals, 3 grams protein) Supplements: per MAR, pt consumed on Boost Breeze supplement (250 kcals, 9 grams protein)  Total intake: 367 kcal (19% of minimum estimated needs)  12 protein (11% of minimum estimated needs)   Nutrition Dx: Increased nutrient needs related to  (trauma) as evidenced by estimated needs; ongoing  Goal: Patient will meet greater than or equal to 90% of their needs; unmet  Intervention:   -Continue Ensure Enlive po to daily, each supplement provides 350 kcal and 20 grams of protein -Continue Boost Breeze po BID, each supplement provides 250 kcal and 9 grams of protein -Continue Snacks TID (yogurt) -Will follow-up on 04/21/16 for conclusion of calorie count -If pt's PO intake remains poor, may need to consider supplemental TF. Consider nocturnal TF- Osmolite 1.5 @ 90 m/hr via PEG over 12 hour period. Regimen will provide 1620 kcals, 68 grams protein, and 832 ml fluid daily, meeting 85% of estimated kcal needs and 65% of estimated protein needs  [name] Bell, RD, LDN, CDE Pager: [phone] After hours Pager: [phone]

## 2016-04-21 NOTE — Progress Notes (Signed)
[facility] Progress Note  51 Days Post-Op  Subjective: Laying in bed, tech in the room. NAD. Drinking ensure. States she feels likes he is improving and hopes to work with PT today. Complains of pain lateral to right breast, where CT was previously. Requests decrease in ULTRAM dose, as  makes her drowsy. Urinating and having BMs.   Discussed the goal of 5-6 bottles of ensure daily.   Objective: Vital signs in last 24 hours: Temp:  [99.3 F (37.4 C)-100.1 F (37.8 C)] 99.3 F (37.4 C) (06/14 0523) Pulse Rate:  [101-106] 101 (06/14 0537) Resp:  [18] 18 (06/14 0537) BP: (125-139)/(72-75) 139/72 mmHg (06/14 0523) SpO2:  [96 %-99 %] 98 % (06/14 0523) Last BM Date: 04/20/16  Intake/Output from previous day: 06/13 0701 - 06/14 0700 In: 75 [P.O.:75] Out: -  Intake/Output this shift:    PE: General: alert, no distress Resp: CTAB, not labored Cardio:RRR, no M/G/R; simple interrupted sutures in place inferior and lateral to R breast, no purulent or sanguinous drainage, TTP, no erythema. GI: soft, appropriately TTP near midline wound, ND, Midline wound clean and dressed. +BS  Lab Results:   Recent Labs  04/19/16 1553  WBC 13.7*  HGB 11.3*  HCT 36.4  PLT 273   BMET  Recent Labs  04/19/16 1553  NA 136  K 2.9*  CL 100*  CO2 23  GLUCOSE 100*  BUN 8  CREATININE 0.38*  CALCIUM 9.3   PT/INR No results for input(s): LABPROT, INR in the last 72 hours. CMP     Component Value Date/Time   NA 136 04/19/2016 1553   K 2.9* 04/19/2016 1553   CL 100* 04/19/2016 1553   CO2 23 04/19/2016 1553   GLUCOSE 100* 04/19/2016 1553   BUN 8 04/19/2016 1553   CREATININE 0.38* 04/19/2016 1553   CALCIUM 9.3 04/19/2016 1553   PROT 7.4 04/19/2016 1553   ALBUMIN 2.3* 04/19/2016 1553   AST 21 04/19/2016 1553   ALT 20 04/19/2016 1553   ALKPHOS 112 04/19/2016 1553   BILITOT 2.0* 04/19/2016 1553   GFRNONAA >60 04/19/2016 1553   GFRAA >60 04/19/2016 1553   Lipase  No  results found for: LIPASE     Studies/Results: Dg Chest 1 View  04/20/2016  CLINICAL DATA:  Pelvic fracture.  Subcutaneous emphysema. EXAM: CHEST 1 VIEW COMPARISON:  04/19/2016.  04/08/2016. FINDINGS: Patient is rotated to the left. Mediastinum hilar structures stable. Heart size stable. Stable opacification of the right lung is noted. Stable right pleural effusion noted. Stable small pleural air collections. a right chest wall subcutaneous emphysema again noted. Multiple displaced right rib fractures again noted. IMPRESSION: 1. Stable opacification right lung consistent with contusion and/or atelectasis again noted. Stable right pleural effusion and small pleural air collections again noted noted. 2. Multiple displaced right rib fractures with right chest wall subcutaneous emphysema again noted. Electronically Signed   By: Maisie Fus  Register   On: 04/20/2016 07:45   Dg Chest 2 View  04/19/2016  ADDENDUM REPORT: 04/19/2016 17:12 ADDENDUM: Study discussed by telephone with Dr. Carman Ching on 04/19/2016 at 1704 hours. Electronically Signed   By: Odessa Fleming M.D.   On: 04/19/2016 17:12  04/19/2016  CLINICAL DATA:  35 year old female with chest congestion and cough productive of clear sputum for 2 days. Initial encounter. Recurrent right pleural effusion following multiple thoracic trauma. EXAM: CHEST  2 VIEW COMPARISON:  04/14/2016 and earlier. FINDINGS: Numerous right rib fractures re- demonstrated. Right pleural pigtail catheter was removed prior  to the 04/14/2016 comparison. There is a new small volume of subcutaneous gas along the right chest wall (arrows). Moderate size right pleural effusion. In addition, on the second lateral view there may be an air-fluid level in the right lung apex (arrow). Stable mild leftward shift of the mediastinum. Mediastinal contours remain normal. Visualized tracheal air column is within normal limits. The left lung appears clear. IMPRESSION: 1. New abnormal subcutaneous gas  along the right chest wall as well as possible right apex air-fluid level raising the possibility of right pneumothorax and/or continued pleural leak. Underlying moderate size right pleural effusion. 2. Recommend follow-up chest CT to further evaluate Electronically Signed: By: Odessa FlemingH  Hall M.D. On: 04/19/2016 16:47   Dg Pelvis Comp Min 3v  04/20/2016  CLINICAL DATA:  Pelvic fracture fixation EXAM: JUDET PELVIS - 3+ VIEW COMPARISON:  04/12/2016 FINDINGS: Two screws are across the sacrum and bilateral SI joints unchanged in position from the prior study. The SI joints are in satisfactory alignment. Bilateral superior inferior pubic rami fractures show partial healing and mild displacement. Both hip joints are normal. Contrast in the colon from prior CT.  No bowel obstruction IMPRESSION: Screw fixation of the sacrum and bilateral SI joints unchanged from the prior study. Electronically Signed   By: Marlan Palauharles  Clark M.D.   On: 04/20/2016 07:44    Anti-infectives: Anti-infectives    Start     Dose/Rate Route Frequency Ordered Stop   04/02/16 1200  fluconazole (DIFLUCAN) 40 MG/ML suspension 400 mg     400 mg Per Tube  Once 04/02/16 1026 04/02/16 1442   03/31/16 1000  sulfamethoxazole-trimethoprim (BACTRIM,SEPTRA) 200-40 MG/5ML suspension 40 mL  Status:  Discontinued     40 mL Per Tube Every 12 hours 03/31/16 0927 03/31/16 0956   03/31/16 1000  sulfamethoxazole-trimethoprim (BACTRIM,SEPTRA) 200-40 MG/5ML suspension 20 mL     20 mL Per Tube Every 6 hours 03/31/16 0956 04/03/16 2359   03/26/16 1030  cefTRIAXone (ROCEPHIN) 2 g in dextrose 5 % 50 mL IVPB  Status:  Discontinued     2 g 100 mL/hr over 30 Minutes Intravenous Every 24 hours 03/26/16 1023 03/31/16 0921   03/25/16 1000  ceFEPIme (MAXIPIME) 2 g in dextrose 5 % 50 mL IVPB  Status:  Discontinued     2 g 100 mL/hr over 30 Minutes Intravenous Every 12 hours 03/25/16 0829 03/26/16 1023   03/08/16 1400  levofloxacin (LEVAQUIN) IVPB 750 mg     750 mg 100  mL/hr over 90 Minutes Intravenous Every 24 hours 03/08/16 0912 03/10/16 1501   03/06/16 1400  ceFEPIme (MAXIPIME) 2 g in dextrose 5 % 50 mL IVPB  Status:  Discontinued     2 g 100 mL/hr over 30 Minutes Intravenous Every 12 hours 03/06/16 1344 03/08/16 0912   03/01/16 1330  ceFAZolin (ANCEF) IVPB 2g/100 mL premix     2 g 200 mL/hr over 30 Minutes Intravenous To [facility] 02/29/16 0917 03/01/16 1452   02/11/16 0600  piperacillin-tazobactam (ZOSYN) IVPB 3.375 g  Status:  Discontinued     3.375 g 100 mL/hr over 30 Minutes Intravenous 4 times per day 02/10/16 2214 02/22/16 0853   02/10/16 1730  piperacillin-tazobactam (ZOSYN) IVPB 3.375 g  Status:  Discontinued     3.375 g 12.5 mL/hr over 240 Minutes Intravenous 3 times per day 02/10/16 1727 02/10/16 2214       Assessment/Plan Jump from hotel/multiple toxic ingestions -Continue on Zoloft, sitter for safety.  S/P ex lap, hepatorraphy, SBR, cholecystectomy,  preperitoneal pelvic packing [name] 4/3  Ex lap for acidosis/bleeding 4/4 Thompson Ex lap for bleeding 4/5 [name] S/P ex fix pelvis [name] 4/3 S/P ex lap 4/7 Thompson S/P ex lap 4/10 Thompson S/P sacral screws 4/10 [name] S/P application ABRA, open gastrostomy tube 4/12 [name]  S/P abd closure, trach 4/24 [name] Mult B rib FX/R HPTX, B pulm contusions - IR placed Chest tube drain (03/27/16) for pleural effusion, TCTS following.Pigtail drain placed 5/20 and removed 04/10/16 Multiple toxic ingestions Mult L wrist FXs - splint until f/u with Dr. Melvyn Novas as OP, can WBAT Pelvic FX - S/P ex fix and angioembolization, S/P sacral screw by Dr. [name]. Bed to chair transfers only for another 4-6 weeks, Pivot with to assist with transfers. ABL anemia FEN - Calorie count. Encourage PO intake.  VTE - SCD's, Lovenox.  Dispo - encourage PO ensure. [facility]. Could possibly go to inpatient rehab prior to [facility], appreciate PT.   LOS: 72 days    Adam Phenix ,  [facility] 04/21/2016, 10:22 AM Pager: [phone] Mon-Fri 7:00 am-4:30 pm Sat-Sun 7:00 am-11:30 am

## 2016-04-21 NOTE — Clinical Social Work Psych Note (Signed)
Psych CSW was notified by [facility] that the following clinicals were needed for further review for patient's possible admission to waitlist: CBC, metabolic panel, progress note from today, chest x ray and surgery progress note.  Psych CS[facility] and confirmed receipt.  [name] PennaGina Madalyn Legner, [name] Bell [phone]  [address] [facility] Psychiatric Service Line Licensed Clinical Social Worker

## 2016-04-21 NOTE — Progress Notes (Signed)
   04/21/16 1059  Clinical Encounter Type  Visited With Patient  Visit Type Follow-up  Referral From Chaplain  Spiritual Encounters  Spiritual Needs Emotional;Prayer  Stress Factors  Patient Stress Factors Health changes;Major life changes  Chaplain found patient to be an articulate, curious woman with hard-gained sense of blessing. She spoke of the support her sister has offered in sharing perspectives on mother and childhood, the kindness others have shown her since her stay, and discussed her religious leanings, which are a mixture of Buddhist and [place]. She finds music to be comforting and has studied its effects on the brain. Chaplain hopes to visit patient again before she is transferred. Marnie Fazzino, Chaplain

## 2016-04-21 NOTE — Progress Notes (Signed)
Calorie Count Note  48 hour calorie count ordered.  Pt receiving nursing care at time of visit (being repositioned in bed).   Reviewed trauma service note from 04/20/16; pt with very poor po intake, however, refuses to reinstate TF due to abdominal pain. Per trauma service,  Pt does not have any mechanical bowel obstruction. Pt amenable to try to consume more PO as well as achieve main source of nutrition via Ensure supplements. Trauma service has increased Ensure supplements to 6 times per day (2100 kcals and 120 grams protein, which will meet 100% of pt needs if pt consumes all of supplements ordered).   Calorie count data overall incomplete. However, it is encouraging that pt's intake has increased approximately 200% over the past 24 hours, per limited documentation of foods and supplements. Also noted pt has received 3 Ensure supplements since 0600.   Diet: Full Liquid Supplements: Ensure Enlive po daily, each supplement provides 350 kcal and 20 grams of protein; Boost Breeze po BID, each supplement provides 250 kcal and 9 grams of protein  Day 1  Breakfast: 0% Lunch: refused (0%) Dinner: cream of chicken soup (117 kcals, 3 grams protein) Supplements: per MAR, pt consumed on Boost Breeze supplement (250 kcals, 9 grams protein)  Total intake: 367 kcal (19% of minimum estimated needs)  12 protein (11% of minimum estimated needs)   Day 2  Breakfast: no documented intake Lunch: 0% Dinner: no documented intake Supplements: per MAR, pt consumed one Boost Breeze supplement (250 kcals, 9 grams protein) and  oneEnsure Enlive po BID (350 kcal and 20 grams of protein)  Total intake: 600 kcal 32% of minimum estimated needs)  29 protein (28% of minimum estimated needs)   Average Total intake: 484 kcal (25% of minimum estimated needs)  41 protein (39% of minimum estimated needs)  Nutrition Dx: Increased nutrient needs related to(trauma) as evidenced by estimated needs; ongoing  Goal:  Patient will meet greater than or equal to 90% of their needs; unmet  Intervention:   -Continue Ensure Enlive po to 6 times daily, each supplement provides 350 kcal and 20 grams of protein -D/c Boost Breeze po BID, each supplement provides 250 kcal and 9 grams of protein -Continue Snacks TID (yogurt) -RD will continue to adjust supplementation as appropriate  [name] Bell, RD, LDN, CDE Pager: [phone] After hours Pager: [phone]

## 2016-04-21 NOTE — Progress Notes (Signed)
Patient ID: [name], female   DOB: [birth year], 35 y.o.   MRN: [id number] [name] Bell's abdominal pain with eating is not due to any mechanical bowel obstruction Hopefully she will be able to take the Ensure nutritional supplementation as ordered so she will not require TNA for her protein calorie malnutrition. She agrees to try hard to increase her oral intake. [name], MD, MPH, FACS Trauma: [phone] General Surgery: [phone]

## 2016-04-22 ENCOUNTER — Inpatient Hospital Stay (HOSPITAL_COMMUNITY): Payer: Medicaid Other

## 2016-04-22 LAB — CBC
HCT: 34.6 % — ABNORMAL LOW (ref 36.0–46.0)
Hemoglobin: 11.1 g/dL — ABNORMAL LOW (ref 12.0–15.0)
MCH: 28.1 pg (ref 26.0–34.0)
MCHC: 32.1 g/dL (ref 30.0–36.0)
MCV: 87.6 fL (ref 78.0–100.0)
PLATELETS: 299 10*3/uL (ref 150–400)
RBC: 3.95 MIL/uL (ref 3.87–5.11)
RDW: 15 % (ref 11.5–15.5)
WBC: 20.8 10*3/uL — ABNORMAL HIGH (ref 4.0–10.5)

## 2016-04-22 LAB — BASIC METABOLIC PANEL
Anion gap: 10 (ref 5–15)
BUN: 6 mg/dL (ref 6–20)
CALCIUM: 9 mg/dL (ref 8.9–10.3)
CO2: 26 mmol/L (ref 22–32)
CREATININE: 0.43 mg/dL — AB (ref 0.44–1.00)
Chloride: 99 mmol/L — ABNORMAL LOW (ref 101–111)
Glucose, Bld: 100 mg/dL — ABNORMAL HIGH (ref 65–99)
Potassium: 2.8 mmol/L — ABNORMAL LOW (ref 3.5–5.1)
SODIUM: 135 mmol/L (ref 135–145)

## 2016-04-22 MED ORDER — PIPERACILLIN-TAZOBACTAM 3.375 G IVPB
3.3750 g | Freq: Three times a day (TID) | INTRAVENOUS | Status: DC
  Administered 2016-04-22 – 2016-04-27 (×14): 3.375 g via INTRAVENOUS
  Filled 2016-04-22 (×16): qty 50

## 2016-04-22 MED ORDER — PIPERACILLIN-TAZOBACTAM 3.375 G IVPB: 3.3750 g | Freq: Four times a day (QID) | INTRAVENOUS | Status: DC

## 2016-04-22 MED ORDER — VANCOMYCIN HCL 10 G IV SOLR
1500.0000 mg | Freq: Two times a day (BID) | INTRAVENOUS | Status: DC
  Administered 2016-04-22 – 2016-04-27 (×11): 1500 mg via INTRAVENOUS
  Filled 2016-04-22 (×13): qty 1500

## 2016-04-22 MED ORDER — POTASSIUM CHLORIDE CRYS ER 20 MEQ PO TBCR
40.0000 meq | EXTENDED_RELEASE_TABLET | Freq: Three times a day (TID) | ORAL | Status: DC
  Administered 2016-04-22 – 2016-04-25 (×3): 40 meq via ORAL
  Filled 2016-04-22 (×7): qty 2

## 2016-04-22 NOTE — Clinical Social Work Psych Note (Signed)
Psych CS[facility] and confirmed that no additional clinicals were needed for review for patient to be added to the waitlist.  Junious DresserConnie states everything has been received as per Barbara's report yesterday.  Junious DresserConnie also states the clinicals were given to medical to review and was then passed to Dr Glendell Dockerooke.  Patient currently under review.    Vickii PennaGina Reymundo Winship, LCSW [phone]  [facility] Psychiatric Service Line Licensed Clinical Social Worker

## 2016-04-22 NOTE — Progress Notes (Signed)
PT Cancellation Note  Patient Details Name: Phylliss BobKatia D Stemler MRN: [id number] DOB: [birth year]   Cancelled Treatment:    Reason Eval/Treat Not Completed: Medical issues which prohibited therapy.  Pt with drainage from old Chest Tube incision and not appropriate for mobility at this time per Trauma.  Will hold and f/u tomorrow.     Floria Brandau, Alison MurrayMegan F 04/22/2016, 10:54 AM

## 2016-04-22 NOTE — Progress Notes (Signed)
Pharmacy Antibiotic Note  [name] Bell is a 35 y.o. female admitted on 02/09/2016 with wound from chest tube.  Pharmacy has been consulted for vancomycin dosing.  Plan: Vancomycin 1500 IV every 12 hours.  Goal trough 10-15 mcg/mL.  Height: 5\' 11"  (180.3 cm) (02/09/16) Weight: 182 lb (82.555 kg) IBW/kg (Calculated) : 70.8  Temp (24hrs), Avg:99.5 F (37.5 C), Min:99.2 F (37.3 C), Max:99.8 F (37.7 C)   Recent Labs Lab 04/19/16 1553  WBC 13.7*  CREATININE 0.38*    Estimated Creatinine Clearance: 112.8 mL/min (by C-G formula based on Cr of 0.38).    No Known Allergies  Antimicrobials this admission: Cefepime 4/29 >> 5/1; 5/18 x2 doses Levofloxacin 5/1 >> 5/3 Ceftriaxone 5/19 >> 5/23 Bactrim 5/24 >> 5/27 Fluconazole 5/26 x1 Vancomycin 6/15>>  Dose adjustments this admission: None yet for vancomycin  Microbiology results: 6/15 chest wound:  5/20 CT drainage: enterobacter (R roceph; sens bactrim, FQN) 5/17 TA: enterobacter (R only to cefazolin) 5/16 TA: f ew GPC in pairs on stain 5/6, 5/10 TA: normal flora 4/26 TA: enterobacter cloacae (S- cefepime, cipro, aminoglycosides, Bactrim)  Thank you for allowing pharmacy to be a part of this patient's care.  Gopal Malter D. Kylie Simmonds, PharmD, BCPS Clinical Pharmacist Pager: [phone] 04/22/2016 11:32 AM

## 2016-04-22 NOTE — Progress Notes (Signed)
OT Cancellation Note  Patient Details Name: [name] Bell MRN: [id number] DOB: 29-May-1981   Cancelled Treatment:    Reason Eval/Treat Not Completed: Medical issues which prohibited therapy. Drainage around chest tube site. PA asking to hold today. Will attempt tomorrow if appropriate.   [facility]  Armon Orvis, OTR/L  [phone] 04/22/2016 04/22/2016, 11:07 AM

## 2016-04-22 NOTE — Progress Notes (Signed)
LOS: 73 days   Subjective: Patient lying in bed comfortably. Only able to drink 3.5 ensure yesterday because she was sleeping throughout the day. Her goal is to try to finish 5 ensure today. Less pain with eating than previously, but still some discomfort. Also c/o tenderness around site where chest tube was lateral to right breast. Would like ULTRAM to be changed to PRN, she feels like this is what makes her sleep so much during the day.   Encouraged nutrition goals and PT participation.   Objective: Vital signs in last 24 hours: Temp:  [99.2 F (37.3 C)-99.8 F (37.7 C)] 99.8 F (37.7 C) (06/15 0535) Pulse Rate:  [96-99] 96 (06/15 0535) Resp:  [17-18] 18 (06/15 0535) BP: (133-134)/(72) 134/72 mmHg (06/15 0535) SpO2:  [96 %-98 %] 96 % (06/15 0535) Last BM Date: 04/20/16   Laboratory  CBC  Recent Labs  04/19/16 1553  WBC 13.7*  HGB 11.3*  HCT 36.4  PLT 273   BMET  Recent Labs  04/19/16 1553  NA 136  K 2.9*  CL 100*  CO2 23  GLUCOSE 100*  BUN 8  CREATININE 0.38*  CALCIUM 9.3   Pre-albumin on 6/13: 3.9 mg/dL   Physical Exam General: alert and oriented, cooperative. No distress Resp:CTAB, unlabored Cardio:RRR, no M/G/R WG:NFAOGI:soft, mildly ttp near midline wound. Wound is clean and dressed. Normal bowel sounds Skin: simple interrupted sutures inferolateral to right breast, no drainage, no erythema, ttp with induration around and some fluctuance beneath site.   Assessment/Plan: Jump from hotel/multiple toxic ingestions -Continue on Zoloft, sitter for safety.  S/P ex lap, hepatorraphy, SBR, cholecystectomy, preperitoneal pelvic packing [name] 4/3  Ex lap for acidosis/bleeding 4/4 Thompson Ex lap for bleeding 4/5 [name] S/P ex fix pelvis [name] 4/3 S/P ex lap 4/7 Thompson S/P ex lap 4/10 Thompson S/P sacral screws 4/10 [name] S/P application ABRA, open gastrostomy tube 4/12 [name]  S/P abd closure, trach 4/24 [name] Mult B rib FX/R HPTX, B pulm contusions - IR  placed Chest tube drain (03/27/16) for pleural effusion, TCTS following.Pigtail drain placed 5/20 and removed 04/10/16 Multiple toxic ingestions Mult L wrist FXs - splint until f/u with Dr. Melvyn Novasrtmann as OP, can WBAT Pelvic FX - S/P ex fix and angioembolization, S/P sacral screw by Dr. Carola FrostHandy. Bed to chair transfers only for another 4-6 weeks, Pivot with to assist with transfers. ABL anemia FEN - Calorie count. Encourage PO intake. Change ULTRAM to PRN. VTE - SCD's, Lovenox.  [facility], BMP, progress note from 6/14, CXR, surgery progress note. [facility].     LOS: 73 days    Kelly Rayburn PA-S  04/22/2016

## 2016-04-23 LAB — BASIC METABOLIC PANEL
ANION GAP: 12 (ref 5–15)
BUN: 7 mg/dL (ref 6–20)
CHLORIDE: 101 mmol/L (ref 101–111)
CO2: 27 mmol/L (ref 22–32)
Calcium: 9 mg/dL (ref 8.9–10.3)
Creatinine, Ser: 0.4 mg/dL — ABNORMAL LOW (ref 0.44–1.00)
GFR calc Af Amer: >60 mL/min (ref >60)
GFR calc non Af Amer: >60 mL/min (ref >60)
Glucose, Bld: 84 mg/dL (ref 65–99)
POTASSIUM: 3.2 mmol/L — AB (ref 3.5–5.1)
SODIUM: 140 mmol/L (ref 135–145)

## 2016-04-23 NOTE — Progress Notes (Signed)
LOS: 74 days   Subjective: Patient resting comfortably in bed. Feels tired and nauseated. Nausea hindered PO intake yesterday. Still tender on the right chest wall, but less so than yesterday. No fever, chill, SOB overnight.  RN reports dressing over abscess had to be changed 5 times overnight.  Objective: Vital signs in last 24 hours: Temp:  [97.9 F (36.6 C)-99.4 F (37.4 C)] 97.9 F (36.6 C) (06/16 0552) Pulse Rate:  [72-91] 72 (06/16 0552) Resp:  [18-20] 20 (06/16 0552) BP: (118-138)/(77-90) 138/90 mmHg (06/16 0552) SpO2:  [89 %-97 %] 95 % (06/16 0552) Last BM Date: 04/20/16   Laboratory  CBC  Recent Labs  04/22/16 1134  WBC 20.8*  HGB 11.1*  HCT 34.6*  PLT 299   BMET  Recent Labs  04/22/16 1134  NA 135  K 2.8*  CL 99*  CO2 26  GLUCOSE 100*  BUN 6  CREATININE 0.43*  CALCIUM 9.0   Imaging Results EXAM: PORTABLE CHEST 1 VIEW  COMPARISON: 05/07/2016, chest radiography.  FINDINGS: Cardiomediastinal silhouette is mildly displaced to the left.  There is persistent not significantly changed opacification of the right hemithorax likely caused by right lung airspace consolidation and layering right pleural effusion. Small amount of pleural gas is seen mostly at the right costophrenic angle. The left lung is clear.  Displaced right-sided rib fractures again seen. Decreasing amount of right chest wall subcutaneous emphysema.  IMPRESSION: Persistent airspace consolidation/ atelectasis of the right lung with layering right pleural effusion.  Decrease in the size of previously noted right pneumothorax.  Stable appearance of right-sided rib fractures.  Mild leftward mediastinal shift.   Electronically Signed  By: Ted Mcalpineobrinka Dimitrova M.D.  On: 04/22/2016 12:11   Physical Exam General: alert and oriented. Cooperative. No acute distress. Resp: CTAB, unlabored. Cardio: RRR, no M/G/R GI: soft, nontender. Normal bowel sounds. Midline  wound dressed. Skin: right chest wall abscess open gauze dressing over. Copious drainage. Appropriately ttp.   Assessment/Plan: Jump from hotel/multiple toxic ingestions -Continue on Zoloft, sitter for safety.  S/P ex lap, hepatorraphy, SBR, cholecystectomy, preperitoneal pelvic packing [name] 4/3  Ex lap for acidosis/bleeding 4/4 Thompson Ex lap for bleeding 4/5 [name] S/P ex fix pelvis [name] 4/3 S/P ex lap 4/7 Thompson S/P ex lap 4/10 Thompson S/P sacral screws 4/10 [name] S/P application ABRA, open gastrostomy tube 4/12 [name]  S/P abd closure, trach 4/24 [name] Mult B rib FX/R HPTX, B pulm contusions - IR placed Chest tube drain (03/27/16) for pleural effusion, TCTS following.Pigtail drain placed 5/20 and removed 04/10/16 Multiple toxic ingestions Mult L wrist FXs - splint until f/u with Dr. Melvyn Novasrtmann as OP, can WBAT Pelvic FX - S/P ex fix and angioembolization, S/P sacral screw by Dr. Carola FrostHandy. Bed to chair transfers only for another 4-6 weeks, Pivot with to assist with transfers. ABL anemia Right chest wall abscess  - leukocytosis 20.8, no fevers. Culture pending. Recheck CBC. - draining and dry dressing over, IV vanc and zosyn.  FEN - Calorie count. Encourage PO intake. PRN zofran for nausea. VTE - SCD's, Lovenox. [facility]po [facility]. [facility].    LOS: 74 days    Jessen Siegman Rayburn PA-S  04/23/2016

## 2016-04-23 NOTE — Progress Notes (Addendum)
Nutrition Follow-up  DOCUMENTATION CODES:   Obesity unspecified  INTERVENTION:   -Ensure Enlive po 6 times daily, each supplement provides 350 kcal and 20 grams of protein -Consider diet advancement to regular, so pt has a wider variety of food choices to improve PO intake -Magic Cup TID with meals -Snacks TID  NUTRITION DIAGNOSIS:   Increased nutrient needs related to  (trauma) as evidenced by estimated needs.  Ongoing  GOAL:   Patient will meet greater than or equal to 90% of their needs  Unmet  MONITOR:   PO intake, Supplement acceptance, Labs, Weight trends, Skin, I & O's  REASON FOR ASSESSMENT:   Consult Calorie Count  ASSESSMENT:   Pt from [place] with no past medical hx admitted after suicide attempt by ingestion of Drano, 2 bottles of acetaminophen, nyquil, and possibly 6 alprazolam pills and jumping from 5th floor balcony of hotel. 4/3 pt is s/p exp lap, hepatorraphy, SBR, cholecystectomy, preperitoneal pelvic packing, ex fix pelvis, R HPTX, Bil pulmonary contusions. Plan for repeat OR 4/5 for another possible SBR. Abdomen remains open.   Case discussed with RN. She reports that pt underwent rt chest wall abscess drainage on 04/22/16; pt with a lot of drainage to site, requiring up to 6 dressing changes daily. RN confirms poor po intake with nausea, however, pt is refusing nausea medications.   Spoke with pt at bedside, who reports that she just woke up from a nap. She reports multiple barriers that prevent her from taking in adequate PO's, including abdominal pain, sleepiness, and rt chest wall abscess drainage. She reports that when she does not have any medical issues, she is willing to take 3 Ensure supplements daily, which she revealed that she did a few days ago (which is confirmed by MD documentation). She expresses desire to eat, but has no appetite. She reveals that she takes a few bites of soup, fruit, and pudding and then experiences severe abdominal pain after.  She does not like the yogurt provided between meals. She also shares that she experiences nausea with strong food odors.   Pt became very tearful during RD visit, reporting that she feels like she is being blamed for her lack of progress in regards to therapy and nutrition. She expresses that she is very motivated to return home within a 6 week time frame. RD actively listened to pt and offered emotional support. RD discussed importance of good nutrition intake to help support medical progression. Discussed possibility of initiating supplemental nutrition support if pt continues to not meet nutritional goals via mouth (ex bolus vs nocturnal feedings) which pt was not agreeable to.   Pt expressed concern about being unable to eat large amounts at one time. Discussed importance of consuming small, frequent meals with Ensure supplements to aid in assistance of meeting nutritional goals. Also encouraged pt to order cold foods with no odors to assist with nausea.   Labs reviewed: K: 3.2 (on PO supplementation).  Diet Order:  Diet full liquid Room service appropriate?: Yes; Fluid consistency:: Thin  Skin:  Reviewed, no issues (incisions)  Last BM:  04/23/16  Height:   Ht Readings from Last 1 Encounters:  03/18/16  (1.803 m)    Weight:   Wt Readings from Last 1 Encounters:  04/20/16 182 lb (82.555 kg)    Ideal Body Weight:  70.4 kg  BMI:  Body mass index is 25.4 kg/(m^2).  Estimated Nutritional Needs:   Kcal:  1900-2100  Protein:  105-120 grams  Fluid:  1.9-2.1 L  EDUCATION NEEDS:   No education needs identified at this time  Riverlyn Kizziah A. Mayford KnifeWilliams, RD, LDN, CDE Pager: [phone] After hours Pager: [phone]

## 2016-04-23 NOTE — Progress Notes (Signed)
Occupational Therapy Treatment Patient Details Name: [name] Bell MRN: [id number] DOB: 06-01-1981 Today's Date: 04/23/2016    History of present illness [name] Bell is a 35 y.o. female.She is obese. PMH unknown.Pt attempted suicide: taped DNR note to chest, drank Drano, then jumped off 5 story building ([address] [place]).S/p 02/09/16 gel foam embolization of bil internal iliac arteries.S/p 02/09/16 ex lap with cholecystectomy, resection SB and mesenteric repair, liver lac repair, application or wound vac.S/p 02/09/16 closed reduction of pelvic ring, external pelvic fixation.Also has right metacarpal fracture in splint. Pt on and off intubation via trach/ and on/off trac collar.  Pelvic x- fixator pins removed at bedside on 03/23/16.    OT comments  Patient making slow progress towards OT goals, continue plan of care for now. Pt with increased frustration and agitation during this OT/PT co-treat. Pt made comment that she was angry from earlier and that her sister left to "cool down". Nursing assistant director present in room upon OT entering. Pt did get up into w/c.   Follow Up Recommendations  CIR;Supervision/Assistance - 24 hour    Equipment Recommendations  Hospital bed;Wheelchair cushion (measurements OT);Wheelchair (measurements OT);3 in 1 bedside comode;Other (comment)    Recommendations for Other Services  None at this time   Precautions / Restrictions Precautions Precautions: Fall Precaution Comments: drainage from right abcess  Required Braces or Orthoses: Other Brace/Splint Other Brace/Splint: L wrist in splint, abdominal binder Restrictions Weight Bearing Restrictions: Yes LUE Weight Bearing: Weight bearing as tolerated RLE Weight Bearing: Weight bearing as tolerated LLE Weight Bearing: Weight bearing as tolerated Other Position/Activity Restrictions: WBAT on legs only to stand to pivot to chair    Mobility Bed Mobility Overal bed mobility: Needs Assistance;+2 for  physical assistance   Rolling: Min assist Sidelying to sit: Mod assist;+2 for physical assistance;+2 for safety/equipment;HOB elevated       General bed mobility comments: Increased pain going from sideyling to sit. Therapist educated pt on not letting others pull on her arm, instead helping from her shoulder.   Transfers Overall transfer level: Needs assistance   Transfers: Sit to/from Stand Sit to Stand: Min assist;+2 safety/equipment         General transfer comment: Pt able to stand with min assist using STEDY, therapist assisting with boosting pt up into standing. Pt tearful during this and stated she didn't feel as though she could stay in STEDY for long.     Balance Overall balance assessment: Needs assistance Sitting-balance support: Feet unsupported;Bilateral upper extremity supported Sitting balance-[name]: Good Sitting balance - Comments: At one point pt asked therapist to step back and giver her space, pt able to maintain static sitting with distant supervision   Standing balance support: Bilateral upper extremity supported Standing balance-[name]: Fair Standing balance comment: standing in STEDY   ADL Overall ADL's : Needs assistance/impaired General ADL Comments: Pt found supine in bed, willing to participate in therapy today. PT/OT present. Pt engaged in bed mobility, increased pain in abdomin area upon sitting up and increased drainage to right side. Pt tearful at this time and required max encouragement to proceed. Pt initially stated she wanted to transfer into w/c then go in sink for grooming and bathing tasks. Pt stood with STEDY, increased pain and anxiety during this. Assisted pt to w/c, once in w/c pt with complaints of a "wedgie" and as therapist attempting to assist pt stated "leave me alone". Pt refused to go into the BR from here and stated she felt she  could only sit in w/c for ~20 minutes. Therapist encouraged 30 minutes at least. Left pt in w/c  with sitter present.      Cognition   Behavior During Therapy: Agitated;Anxious Overall Cognitive Status: Within Functional Limits for tasks assessed (pt requires increased encouragement to participate in therapy)                 Pertinent Vitals/ Pain       Pain Assessment: Faces Faces Pain Scale: Hurts whole lot Pain Location: abdomen Pain Descriptors / Indicators: Aching;Tightness;Guarding;Grimacing Pain Intervention(s): Limited activity within patient's tolerance;Monitored during session;Repositioned (pt refused pain meds before therapy)   Frequency Min 3X/week     Progress Toward Goals  OT Goals(current goals can now befound in the care plan section)  Progress towards OT goals: Progressing toward goals  Acute Rehab OT Goals Patient Stated Goal: to be left alone  OT Goal Formulation: With patient Time For Goal Achievement: 05/04/16 Potential to Achieve Goals: Fair  Plan Discharge plan remains appropriate    Co-evaluation    PT/OT/SLP Co-Evaluation/Treatment: Yes Reason for Co-Treatment: Complexity of the patient's impairments (multi-system involvement);For patient/therapist safety   OT goals addressed during session: ADL's and self-care;Other (comment) (functional mobility)      End of Session Equipment Utilized During Treatment: Other (comment) (STEDY, left wrist brace)   Activity Tolerance Patient tolerated treatment well;Treatment limited secondary to agitation   Patient Left in chair;with call bell/phone within reach;with nursing/sitter in room   Nurse Communication Mobility status;Need for lift equipment (NT present during transfer )     Time: [id number] OT Time Calculation (min): 28 min  Charges: OT General Charges $OT Visit: 1 Procedure OT Treatments $Self Care/Home Management : 8-22 mins  Edwin CapPatricia Kadisha Goodine , MS, OTR/L, CLT  04/23/2016, 12:14 PM

## 2016-04-23 NOTE — Progress Notes (Signed)
Physical Therapy Treatment Patient Details Name: [name] Bell MRN: [id number] DOB: [birth year] Today's Date: 04/23/2016    History of Present Illness [name] Bell is a 35 y.o. female.She is obese. PMH unknown.Pt attempted suicide: taped DNR note to chest, drank Drano, then jumped off 5 story building ([address] [place]).S/p 02/09/16 gel foam embolization of bil internal iliac arteries.S/p 02/09/16 ex lap with cholecystectomy, resection SB and mesenteric repair, liver lac repair, application or wound vac.S/p 02/09/16 closed reduction of pelvic ring, external pelvic fixation.Also has right metacarpal fracture in splint. Pt on and off intubation via trach/ and on/off trac collar.  Pelvic x- fixator pins removed at bedside on 03/23/16.     PT Comments    Pt emotional and tearful during PT/OT session and expressed frustration.  Despite frustrations, pt did agree to OOB to W/C.  Encouraged increased time sitting.  Minimal drainage from dressing on R lateral chest during mobility.  Will continue to follow.    Follow Up Recommendations  CIR     Equipment Recommendations  Wheelchair (measurements PT);Wheelchair cushion (measurements PT);Hospital bed;Other (comment);3in1 (PT)    Recommendations for Other Services       Precautions / Restrictions Precautions Precautions: Fall Precaution Comments: drainage from right abcess  Required Braces or Orthoses: Other Brace/Splint Other Brace/Splint: L wrist in splint, abdominal binder Restrictions Weight Bearing Restrictions: Yes LUE Weight Bearing: Weight bearing as tolerated RLE Weight Bearing: Weight bearing as tolerated LLE Weight Bearing: Weight bearing as tolerated Other Position/Activity Restrictions: WBAT on legs only to stand to pivot to chair    Mobility  Bed Mobility Overal bed mobility: Needs Assistance;+2 for physical assistance Bed Mobility: Rolling;Sidelying to Sit Rolling: Min assist Sidelying to sit: Mod assist;+2 for physical  assistance;+2 for safety/equipment;HOB elevated       General bed mobility comments: Increased pain going from sideyling to sit. Therapist educated pt on not letting others pull on her arm, instead helping from her shoulder.   Transfers Overall transfer level: Needs assistance   Transfers: Sit to/from Stand Sit to Stand: Min assist;+2 safety/equipment         General transfer comment: Pt able to stand with min assist using STEDY, therapist assisting with boosting pt up into standing. Pt tearful during this and stated she didn't feel as though she could stay in STEDY for long.   Ambulation/Gait                 Stairs            Wheelchair Mobility    Modified Rankin (Stroke Patients Only)       Balance Overall balance assessment: Needs assistance Sitting-balance support: Bilateral upper extremity supported;Feet supported Sitting balance-[name]: Fair Sitting balance - Comments: At one point pt asked therapist to step back and giver her space, pt able to maintain static sitting with distant supervision   Standing balance support: Bilateral upper extremity supported;During functional activity Standing balance-[name]: Poor Standing balance comment: standing in STEDY                    Cognition Arousal/Alertness: Awake/alert Behavior During Therapy: Agitated;Anxious Overall Cognitive Status: Within Functional Limits for tasks assessed                      Exercises      General Comments        Pertinent Vitals/Pain Pain Assessment: Faces Faces Pain Scale: Hurts whole lot Pain Location: Abdomen Pain Descriptors /  Indicators: Aching;Grimacing;Guarding Pain Intervention(s): Limited activity within patient's tolerance;Monitored during session;Repositioned (pt refused pain meds before session.)    Home Living                      Prior Function            PT Goals (current goals can now be found in the care plan  section) Acute Rehab PT Goals Patient Stated Goal: to be left alone  PT Goal Formulation: Patient unable to participate in goal setting Time For Goal Achievement: 04/22/16 Potential to Achieve Goals: Good Progress towards PT goals: Progressing toward goals    Frequency  Min 5X/week    PT Plan Current plan remains appropriate    Co-evaluation PT/OT/SLP Co-Evaluation/Treatment: Yes Reason for Co-Treatment: Complexity of the patient's impairments (multi-system involvement);Necessary to address cognition/behavior during functional activity;For patient/therapist safety PT goals addressed during session: Mobility/safety with mobility;Balance OT goals addressed during session: ADL's and self-care;Other (comment) (functional mobility)     End of Session   Activity Tolerance: Patient limited by fatigue (pt labile at times during session) Patient left: with call bell/phone within reach;with nursing/sitter in room (in W/C )     Time: [id number] PT Time Calculation (min) (ACUTE ONLY): 20 min  Charges:  $Therapeutic Activity: 8-22 mins                    G CodesSunny Schlein:      Jovann Luse F, [place]PT [phone] 04/23/2016, 2:53 PM

## 2016-04-24 ENCOUNTER — Inpatient Hospital Stay (HOSPITAL_COMMUNITY): Payer: Medicaid Other

## 2016-04-24 LAB — AEROBIC CULTURE W GRAM STAIN (SUPERFICIAL SPECIMEN): Culture: NORMAL

## 2016-04-24 LAB — AEROBIC CULTURE  (SUPERFICIAL SPECIMEN)

## 2016-04-24 MED ORDER — TRAMADOL HCL 50 MG PO TABS
50.0000 mg | ORAL_TABLET | Freq: Four times a day (QID) | ORAL | Status: DC | PRN
  Administered 2016-04-24 (×2): 50 mg via ORAL
  Filled 2016-04-24 (×2): qty 1

## 2016-04-24 NOTE — Progress Notes (Signed)
54 Days Post-Op  Subjective: PT doing well today Con't with dressing changes  Objective: Vital signs in last 24 hours: Temp:  [98 F (36.7 C)-98.2 F (36.8 C)] 98.2 F (36.8 C) (06/17 16100614) Pulse Rate:  [67-91] 91 (06/17 0614) Resp:  [17-18] 18 (06/17 0614) BP: (126-142)/(80-88) 126/80 mmHg (06/17 0614) SpO2:  [98 %-100 %] 98 % (06/17 0614) Weight:  [89.359 kg (197 lb)] 89.359 kg (197 lb) (06/17 0614) Last BM Date: 04/23/16  Intake/Output from previous day: 06/16 0701 - 06/17 0700 In: 1750 [IV Piggyback:1750] Out: -  Intake/Output this shift:    General appearance: alert and cooperative Chest wall: L chest wall abscess con't to drain GI: soft, non-tender; bowel sounds normal; no masses,  no organomegaly  Lab Results:   Recent Labs  04/22/16 1134  WBC 20.8*  HGB 11.1*  HCT 34.6*  PLT 299   BMET  Recent Labs  04/22/16 1134 04/23/16 0623  NA 135 140  K 2.8* 3.2*  CL 99* 101  CO2 26 27  GLUCOSE 100* 84  BUN 6 7  CREATININE 0.43* 0.40*  CALCIUM 9.0 9.0   PT/INR No results for input(s): LABPROT, INR in the last 72 hours. ABG No results for input(s): PHART, HCO3 in the last 72 hours.  Invalid input(s): PCO2, PO2  Studies/Results: Dg Chest Port 1 View  04/22/2016  CLINICAL DATA:  Pleural effusion. EXAM: PORTABLE CHEST 1 VIEW COMPARISON:  05/07/2016, chest radiography. FINDINGS: Cardiomediastinal silhouette is mildly displaced to the left. There is persistent not significantly changed opacification of the right hemithorax likely caused by right lung airspace consolidation and layering right pleural effusion. Small amount of pleural gas is seen mostly at the right costophrenic angle. The left lung is clear. Displaced right-sided rib fractures again seen. Decreasing amount of right chest wall subcutaneous emphysema. IMPRESSION: Persistent airspace consolidation/ atelectasis of the right lung with layering right pleural effusion. Decrease in the size of previously  noted right pneumothorax. Stable appearance of right-sided rib fractures. Mild leftward mediastinal shift. Electronically Signed   By: Ted Mcalpineobrinka  Dimitrova M.D.   On: 04/22/2016 12:11    Anti-infectives: Anti-infectives    Start     Dose/Rate Route Frequency Ordered Stop   04/22/16 1200  piperacillin-tazobactam (ZOSYN) IVPB 3.375 g  Status:  Discontinued     3.375 g 12.5 mL/hr over 240 Minutes Intravenous Every 6 hours 04/22/16 1039 04/22/16 1042   04/22/16 1200  vancomycin (VANCOCIN) 1,500 mg in sodium chloride 0.9 % 500 mL IVPB     1,500 mg 250 mL/hr over 120 Minutes Intravenous Every 12 hours 04/22/16 1133     04/22/16 1130  piperacillin-tazobactam (ZOSYN) IVPB 3.375 g     3.375 g 12.5 mL/hr over 240 Minutes Intravenous Every 8 hours 04/22/16 1043     04/02/16 1200  fluconazole (DIFLUCAN) 40 MG/ML suspension 400 mg     400 mg Per Tube  Once 04/02/16 1026 04/02/16 1442   03/31/16 1000  sulfamethoxazole-trimethoprim (BACTRIM,SEPTRA) 200-40 MG/5ML suspension 40 mL  Status:  Discontinued     40 mL Per Tube Every 12 hours 03/31/16 0927 03/31/16 0956   03/31/16 1000  sulfamethoxazole-trimethoprim (BACTRIM,SEPTRA) 200-40 MG/5ML suspension 20 mL     20 mL Per Tube Every 6 hours 03/31/16 0956 04/03/16 2359   03/26/16 1030  cefTRIAXone (ROCEPHIN) 2 g in dextrose 5 % 50 mL IVPB  Status:  Discontinued     2 g 100 mL/hr over 30 Minutes Intravenous Every 24 hours 03/26/16 1023 03/31/16 0921  03/25/16 1000  ceFEPIme (MAXIPIME) 2 g in dextrose 5 % 50 mL IVPB  Status:  Discontinued     2 g 100 mL/hr over 30 Minutes Intravenous Every 12 hours 03/25/16 0829 03/26/16 1023   03/08/16 1400  levofloxacin (LEVAQUIN) IVPB 750 mg     750 mg 100 mL/hr over 90 Minutes Intravenous Every 24 hours 03/08/16 0912 03/10/16 1501   03/06/16 1400  ceFEPIme (MAXIPIME) 2 g in dextrose 5 % 50 mL IVPB  Status:  Discontinued     2 g 100 mL/hr over 30 Minutes Intravenous Every 12 hours 03/06/16 1344 03/08/16 0912    03/01/16 1330  ceFAZolin (ANCEF) IVPB 2g/100 mL premix     2 g 200 mL/hr over 30 Minutes Intravenous To ShortStay Surgical 02/29/16 0917 03/01/16 1452   02/11/16 0600  piperacillin-tazobactam (ZOSYN) IVPB 3.375 g  Status:  Discontinued     3.375 g 100 mL/hr over 30 Minutes Intravenous 4 times per day 02/10/16 2214 02/22/16 0853   02/10/16 1730  piperacillin-tazobactam (ZOSYN) IVPB 3.375 g  Status:  Discontinued     3.375 g 12.5 mL/hr over 240 Minutes Intravenous 3 times per day 02/10/16 1727 02/10/16 2214      Assessment/Plan: s/p Procedure(s): ABDOMINAL WOUND CLOSURE (N/A) PERCUTANEOUS TRACHEOSTOMY (N/A) Will obtain CT of chest to r/o pleural communication of chest wall infection con't abx Repeat labs    LOS: 75 days    Marigene Ehlers., Jed Limerick 04/24/2016

## 2016-04-25 DIAGNOSIS — J869 Pyothorax without fistula: Secondary | ICD-10-CM

## 2016-04-25 LAB — CBC
HCT: 38.1 % (ref 36.0–46.0)
Hemoglobin: 11.8 g/dL — ABNORMAL LOW (ref 12.0–15.0)
MCH: 27.6 pg (ref 26.0–34.0)
MCHC: 31 g/dL (ref 30.0–36.0)
MCV: 89 fL (ref 78.0–100.0)
PLATELETS: 325 10*3/uL (ref 150–400)
RBC: 4.28 MIL/uL (ref 3.87–5.11)
RDW: 15.3 % (ref 11.5–15.5)
WBC: 14.1 10*3/uL — AB (ref 4.0–10.5)

## 2016-04-25 LAB — BASIC METABOLIC PANEL
ANION GAP: 9 (ref 5–15)
BUN: 12 mg/dL (ref 6–20)
CALCIUM: 8.6 mg/dL — AB (ref 8.9–10.3)
CO2: 26 mmol/L (ref 22–32)
CREATININE: 0.98 mg/dL (ref 0.44–1.00)
Chloride: 107 mmol/L (ref 101–111)
GFR calc Af Amer: >60 mL/min (ref >60)
GLUCOSE: 91 mg/dL (ref 65–99)
Potassium: 3.3 mmol/L — ABNORMAL LOW (ref 3.5–5.1)
Sodium: 142 mmol/L (ref 135–145)

## 2016-04-25 LAB — PREPARE RBC (CROSSMATCH)

## 2016-04-25 MED ORDER — SIMETHICONE 80 MG PO CHEW
80.0000 mg | CHEWABLE_TABLET | Freq: Four times a day (QID) | ORAL | Status: DC | PRN
  Administered 2016-04-25 – 2016-05-01 (×2): 80 mg via ORAL
  Filled 2016-04-25 (×2): qty 1

## 2016-04-25 MED ORDER — SODIUM CHLORIDE 0.9 % IV SOLN
INTRAVENOUS | Status: DC
  Administered 2016-04-25 – 2016-04-26 (×2): via INTRAVENOUS

## 2016-04-25 MED ORDER — SODIUM CHLORIDE 0.9 % IV SOLN: Freq: Once | INTRAVENOUS | Status: DC

## 2016-04-25 MED ORDER — HYDROMORPHONE HCL 1 MG/ML IJ SOLN
0.5000 mg | INTRAMUSCULAR | Status: DC | PRN
  Administered 2016-04-25 – 2016-04-26 (×8): 0.5 mg via INTRAVENOUS
  Filled 2016-04-25 (×8): qty 1

## 2016-04-25 NOTE — Progress Notes (Signed)
55 Days Post-Op Procedure(s) (LRB): ABDOMINAL WOUND CLOSURE (N/A) PERCUTANEOUS TRACHEOSTOMY (N/A) Subjective:  Noted a lump next to right breast a few days ago that started draining pus.  Objective: Vital signs in last 24 hours: Temp:  [97.8 F (36.6 C)] 97.8 F (36.6 C) (06/18 0630) Pulse Rate:  [74-76] 74 (06/18 0630) Cardiac Rhythm:  [-]  Resp:  [18-20] 20 (06/18 0630) BP: (135-137)/(81-86) 135/81 mmHg (06/18 0630) SpO2:  [96 %-100 %] 100 % (06/18 0630) Weight:  [91.865 kg (202 lb 8.4 oz)] 91.865 kg (202 lb 8.4 oz) (06/18 0630)  Hemodynamic parameters for last 24 hours:    Intake/Output from previous day: 06/17 0701 - 06/18 0700 In: 1390 [P.O.:240; IV Piggyback:1150] Out: -  Intake/Output this shift:    General appearance: alert and cooperative Heart: regular rate and rhythm, S1, S2 normal, no murmur, click, rub or gallop Lungs: diminished breath sounds right chest pus draining from right chest tube site  Lab Results:  Recent Labs  04/25/16 0232  WBC 14.1*  HGB 11.8*  HCT 38.1  PLT 325   BMET:  Recent Labs  04/23/16 0623 04/25/16 0915  NA 140 142  K 3.2* 3.3*  CL 101 107  CO2 27 26  GLUCOSE 84 91  BUN 7 12  CREATININE 0.40* 0.98  CALCIUM 9.0 8.6*    PT/INR: No results for input(s): LABPROT, INR in the last 72 hours. ABG    Component Value Date/Time   PHART 7.508* 02/28/2016 0642   HCO3 25.5* 02/28/2016 0642   TCO2 26.5 02/28/2016 0642   ACIDBASEDEF 2.0 02/13/2016 0411   O2SAT 99.2 02/28/2016 0642   CBG (last 3)  No results for input(s): GLUCAP in the last 72 hours.  Assessment/Plan: S/P Procedure(s) (LRB): ABDOMINAL WOUND CLOSURE (N/A) PERCUTANEOUS TRACHEOSTOMY (N/A)  She has had a persistent right pleural fluid collection since admission due to blunt trauma with multiple broken right ribs. This was managed with a chest tube initially and then pigtail catheter due to her other severe injuries and morbid obesity. She has recovered  remarkably well but now has pus draining from the right chest tube site with CT showing a large loculated right empyema with right lung compressive atelectasis. She is afebrile with WBC 14.1 but this needs to be drained with a thoracotomy. I discussed the surgical procedure with her including alternatives, benefits and risks including but not limited to bleeding, infection, lung injury, prolonged air leak, pleural space problems and she understands and agrees to proceed. Will do tomorrow afternoon.   LOS: 76 days    Alleen BorneBryan K Bartle 04/25/2016

## 2016-04-25 NOTE — Progress Notes (Signed)
Pharmacy Antibiotic Note  Phylliss BobKatia D Defrain is a 35 y.o. female admitted on 02/09/2016 with multiple fractures due to suicide attemp .  Pharmacy consulted for vancomycin dosing on 6/15 for wound infection and possible pneumonia.  She has received multiple antibiotics and has tolerated them without noted complications.  Her WBC has trended down 20.8 >> 14.1.  She is afebrile   Plan: Vancomycin 1500 IV every 12 hours.  Goal trough 10-15 mcg/mL.  Zosyn 3.375gm IV every 8 hours - infused over 4 hours  Height: 5\' 11"  (180.3 cm) (02/09/16) Weight: 202 lb 8.4 oz (91.865 kg) IBW/kg (Calculated) : 70.8  Temp (24hrs), Avg:97.9 F (36.6 C), Min:97.8 F (36.6 C), Max:98.2 F (36.8 C)   Recent Labs Lab 04/19/16 1553 04/22/16 1134 04/23/16 0623 04/25/16 0232 04/25/16 0915  WBC 13.7* 20.8*  --  14.1*  --   CREATININE 0.38* 0.43* 0.40*  --  0.98    Estimated Creatinine Clearance: 103 mL/min (by C-G formula based on Cr of 0.98).    No Known Allergies  Antimicrobials this admission: Cefepime 4/29 >> 5/1; 5/18 x2 doses Levofloxacin 5/1 >> 5/3 Ceftriaxone 5/19 >> 5/23 Bactrim 5/24 >> 5/27 Fluconazole 5/26 x1 Vancomycin 6/15>> Zosyn 6/15 >>  Dose adjustments this admission: None yet for vancomycin  Microbiology results: 6/15 chest wound:  5/20 CT drainage: enterobacter (R roceph; sens bactrim, FQN) 5/17 TA: enterobacter (R only to cefazolin) 5/16 TA: f ew GPC in pairs on stain 5/6, 5/10 TA: normal flora 4/26 TA: enterobacter cloacae (S- cefepime, cipro, aminoglycosides, Bactrim)  Nadara MustardNita Rabon Scholle, PharmD., MS Clinical Pharmacist Pager:  [phone] Thank you for allowing pharmacy to be part of this patients care team. 04/25/2016 1:59 PM

## 2016-04-25 NOTE — Progress Notes (Signed)
55 Days Post-Op  Subjective: CT scan findings noted   Objective: Vital signs in last 24 hours: Temp:  [97.8 F (36.6 C)-98.2 F (36.8 C)] 97.8 F (36.6 C) (06/18 0630) Pulse Rate:  [74-77] 74 (06/18 0630) Resp:  [18-20] 20 (06/18 0630) BP: (135-147)/(81-86) 135/81 mmHg (06/18 0630) SpO2:  [96 %-100 %] 100 % (06/18 0630) Weight:  [91.865 kg (202 lb 8.4 oz)] 91.865 kg (202 lb 8.4 oz) (06/18 0630) Last BM Date: 04/23/16  Intake/Output from previous day: 06/17 0701 - 06/18 0700 In: 1390 [P.O.:240; IV Piggyback:1150] Out: -  Intake/Output this shift:    General appearance: alert and cooperative Chest wall: right sided costochondral tenderness GI: soft, non-tender; bowel sounds normal; no masses,  no organomegaly  Lab Results:   Recent Labs  04/22/16 1134 04/25/16 0232  WBC 20.8* 14.1*  HGB 11.1* 11.8*  HCT 34.6* 38.1  PLT 299 325   BMET  Recent Labs  04/22/16 1134 04/23/16 0623  NA 135 140  K 2.8* 3.2*  CL 99* 101  CO2 26 27  GLUCOSE 100* 84  BUN 6 7  CREATININE 0.43* 0.40*  CALCIUM 9.0 9.0   PT/INR No results for input(s): LABPROT, INR in the last 72 hours. ABG No results for input(s): PHART, HCO3 in the last 72 hours.  Invalid input(s): PCO2, PO2  Studies/Results: Ct Chest Wo Contrast  04/24/2016  CLINICAL DATA:  Follow-up exam. Followup for lung infiltrates. Patient previously had a right chest tube. Multiple right rib fractures. EXAM: CT CHEST WITHOUT CONTRAST TECHNIQUE: Multidetector CT imaging of the chest was performed following the standard protocol without IV contrast. COMPARISON:  04/22/2016 and multiple older chest radiographs. FINDINGS: Neck base and axilla:  No mass or adenopathy. Mediastinum and hila: Heart normal in size and configuration. Poorly defined soft tissue surrounds the right hilum. There are prominent mediastinal lymph nodes. There is a 1 cm short axis right peritracheal node the upper mediastinum. Poorly defined subcarinal  adenopathy is suggested. There is also fluid attenuation surrounding the ascending aorta extending to the anterior mediastinum. Lungs and pleura: Large loculated pleural collection pain mostly fluid but also some air. This extends from the right apex to the right posterior lung base. There is dependent opacity in the adjacent right upper, middle and lower lobes, most prominent in the lower lobe, likely compressive atelectasis. A component of pneumonia is possible. There is minor subsegmental atelectasis in the left lower lobe. Left lung is otherwise clear. No left pleural effusion. There are fractures of all visible right ribs excluding the right first rib. Post ribs have 2 separate fractures. Fractures show surrounding callus formation. There is some fluid and air that extends from the lateral margin of the complex loculated pleural fluid collection into the right lateral chest wall, which may be the previous chest tube insertion site. Limited upper abdomen: No convincing liver contusion or laceration. Liver evaluation is limited on this unenhanced study, however. IMPRESSION: 1. Large loculated right pleural fluid collection. Fluid collection may reflect a hemopneumothorax. Alternatively, this may reflect an empyema. Mass effect from the pleural fluid collection leads to 0 opacity in the dependent right upper right middle and lower lobes which is most likely atelectasis. Pneumonia is possible as a component. 2. Numerous right-sided rib fractures, subacute. Electronically Signed   By: Amie Portland M.D.   On: 04/24/2016 22:20    Anti-infectives: Anti-infectives    Start     Dose/Rate Route Frequency Ordered Stop   04/22/16 1200  piperacillin-tazobactam (ZOSYN)  IVPB 3.375 g  Status:  Discontinued     3.375 g 12.5 mL/hr over 240 Minutes Intravenous Every 6 hours 04/22/16 1039 04/22/16 1042   04/22/16 1200  vancomycin (VANCOCIN) 1,500 mg in sodium chloride 0.9 % 500 mL IVPB     1,500 mg 250 mL/hr over 120  Minutes Intravenous Every 12 hours 04/22/16 1133     04/22/16 1130  piperacillin-tazobactam (ZOSYN) IVPB 3.375 g     3.375 g 12.5 mL/hr over 240 Minutes Intravenous Every 8 hours 04/22/16 1043     04/02/16 1200  fluconazole (DIFLUCAN) 40 MG/ML suspension 400 mg     400 mg Per Tube  Once 04/02/16 1026 04/02/16 1442   03/31/16 1000  sulfamethoxazole-trimethoprim (BACTRIM,SEPTRA) 200-40 MG/5ML suspension 40 mL  Status:  Discontinued     40 mL Per Tube Every 12 hours 03/31/16 0927 03/31/16 0956   03/31/16 1000  sulfamethoxazole-trimethoprim (BACTRIM,SEPTRA) 200-40 MG/5ML suspension 20 mL     20 mL Per Tube Every 6 hours 03/31/16 0956 04/03/16 2359   03/26/16 1030  cefTRIAXone (ROCEPHIN) 2 g in dextrose 5 % 50 mL IVPB  Status:  Discontinued     2 g 100 mL/hr over 30 Minutes Intravenous Every 24 hours 03/26/16 1023 03/31/16 0921   03/25/16 1000  ceFEPIme (MAXIPIME) 2 g in dextrose 5 % 50 mL IVPB  Status:  Discontinued     2 g 100 mL/hr over 30 Minutes Intravenous Every 12 hours 03/25/16 0829 03/26/16 1023   03/08/16 1400  levofloxacin (LEVAQUIN) IVPB 750 mg     750 mg 100 mL/hr over 90 Minutes Intravenous Every 24 hours 03/08/16 0912 03/10/16 1501   03/06/16 1400  ceFEPIme (MAXIPIME) 2 g in dextrose 5 % 50 mL IVPB  Status:  Discontinued     2 g 100 mL/hr over 30 Minutes Intravenous Every 12 hours 03/06/16 1344 03/08/16 0912   03/01/16 1330  ceFAZolin (ANCEF) IVPB 2g/100 mL premix     2 g 200 mL/hr over 30 Minutes Intravenous To ShortStay Surgical 02/29/16 0917 03/01/16 1452   02/11/16 0600  piperacillin-tazobactam (ZOSYN) IVPB 3.375 g  Status:  Discontinued     3.375 g 100 mL/hr over 30 Minutes Intravenous 4 times per day 02/10/16 2214 02/22/16 0853   02/10/16 1730  piperacillin-tazobactam (ZOSYN) IVPB 3.375 g  Status:  Discontinued     3.375 g 12.5 mL/hr over 240 Minutes Intravenous 3 times per day 02/10/16 1727 02/10/16 2214     CT Chest: IMPRESSION: 1. Large loculated right pleural  fluid collection. Fluid collection may reflect a hemopneumothorax. Alternatively, this may reflect an empyema. Mass effect from the pleural fluid collection leads to 0 opacity in the dependent right upper right middle and lower lobes which is most likely atelectasis. Pneumonia is possible as a component. 2. Numerous right-sided rib fractures, subacute.  Assessment/Plan: Principal Problem:   Major depressive disorder, recurrent severe without psychotic features (HCC) Active Problems:   Fall from, out of or through building, not otherwise specified, initial encounter   Hypovolemic shock (HCC)   Pressure ulcer   Multiple pelvic fractures (HCC)   Left wrist fracture   Multiple fractures of ribs of right side   Fracture of multiple ribs of left side   Liver laceration   Small intestine injury   Acute blood loss anemia   Suicide attempt (HCC)   Bilateral pulmonary contusion   Acute respiratory failure (HCC)   Hypokalemia   Hypernatremia   Acute kidney injury (HCC)   Ingestion  of caustic substance   Tylenol overdose   Hyperglycemia   Hepatic failure (HCC)  1. Will ask TCTS to eval for possible VATS 2. con't Vanc/ zosyn- cx pending   LOS: 76 days    [name] Ehlersamirez Jr., Jed LimerickArmando

## 2016-04-26 ENCOUNTER — Encounter (HOSPITAL_COMMUNITY): Admission: EM | Disposition: A | Discharge status: Rehabilitation Facility | Payer: Self-pay | Source: Home / Self Care

## 2016-04-26 ENCOUNTER — Inpatient Hospital Stay (HOSPITAL_COMMUNITY): Payer: Medicaid Other | Admitting: Anesthesiology

## 2016-04-26 ENCOUNTER — Inpatient Hospital Stay (HOSPITAL_COMMUNITY): Payer: Medicaid Other

## 2016-04-26 HISTORY — PX: THORACOTOMY/LOBECTOMY: SHX6116

## 2016-04-26 LAB — GLUCOSE, CAPILLARY: GLUCOSE-CAPILLARY: 116 mg/dL — AB (ref 65–99)

## 2016-04-26 SURGERY — LOBECTOMY, LUNG, OPEN
Anesthesia: General | Site: Chest | Laterality: Right

## 2016-04-26 MED ORDER — FENTANYL CITRATE (PF) 100 MCG/2ML IJ SOLN
INTRAMUSCULAR | Status: DC | PRN
  Administered 2016-04-26 (×4): 50 ug via INTRAVENOUS
  Administered 2016-04-26: 150 ug via INTRAVENOUS
  Administered 2016-04-26: 100 ug via INTRAVENOUS
  Administered 2016-04-26: 50 ug via INTRAVENOUS

## 2016-04-26 MED ORDER — DIPHENHYDRAMINE HCL 12.5 MG/5ML PO ELIX: 12.5000 mg | ORAL_SOLUTION | Freq: Four times a day (QID) | ORAL | Status: DC | PRN

## 2016-04-26 MED ORDER — FENTANYL CITRATE (PF) 250 MCG/5ML IJ SOLN
INTRAMUSCULAR | Status: AC
  Filled 2016-04-26: qty 5

## 2016-04-26 MED ORDER — LACTATED RINGERS IV SOLN: INTRAVENOUS | Status: DC

## 2016-04-26 MED ORDER — 0.9 % SODIUM CHLORIDE (POUR BTL) OPTIME
TOPICAL | Status: DC | PRN
  Administered 2016-04-26: 2000 mL

## 2016-04-26 MED ORDER — MIDAZOLAM HCL 5 MG/5ML IJ SOLN
INTRAMUSCULAR | Status: DC | PRN
  Administered 2016-04-26 (×2): 1 mg via INTRAVENOUS

## 2016-04-26 MED ORDER — NALOXONE HCL 0.4 MG/ML IJ SOLN: 0.4000 mg | INTRAMUSCULAR | Status: DC | PRN

## 2016-04-26 MED ORDER — ONDANSETRON HCL 4 MG/2ML IJ SOLN: 4.0000 mg | Freq: Four times a day (QID) | INTRAMUSCULAR | Status: DC | PRN

## 2016-04-26 MED ORDER — SENNOSIDES-DOCUSATE SODIUM 8.6-50 MG PO TABS
1.0000 | ORAL_TABLET | Freq: Every day | ORAL | Status: DC
  Administered 2016-04-28 – 2016-05-12 (×5): 1 via ORAL
  Filled 2016-04-26 (×10): qty 1

## 2016-04-26 MED ORDER — BISACODYL 5 MG PO TBEC
10.0000 mg | DELAYED_RELEASE_TABLET | Freq: Every day | ORAL | Status: DC
  Administered 2016-04-27 – 2016-05-08 (×8): 10 mg via ORAL
  Filled 2016-04-26 (×13): qty 2

## 2016-04-26 MED ORDER — ENOXAPARIN SODIUM 30 MG/0.3ML ~~LOC~~ SOLN
30.0000 mg | Freq: Two times a day (BID) | SUBCUTANEOUS | Status: DC
  Administered 2016-04-27 – 2016-05-13 (×33): 30 mg via SUBCUTANEOUS
  Filled 2016-04-26 (×34): qty 0.3

## 2016-04-26 MED ORDER — POTASSIUM CHLORIDE 10 MEQ/50ML IV SOLN
10.0000 meq | Freq: Every day | INTRAVENOUS | Status: DC | PRN
  Administered 2016-04-27 – 2016-04-28 (×4): 10 meq via INTRAVENOUS
  Filled 2016-04-26 (×4): qty 50

## 2016-04-26 MED ORDER — MIDAZOLAM HCL 2 MG/2ML IJ SOLN
INTRAMUSCULAR | Status: AC
  Filled 2016-04-26: qty 2

## 2016-04-26 MED ORDER — HYDROMORPHONE HCL 1 MG/ML IJ SOLN
INTRAMUSCULAR | Status: AC
  Filled 2016-04-26: qty 1

## 2016-04-26 MED ORDER — FENTANYL CITRATE (PF) 100 MCG/2ML IJ SOLN
INTRAMUSCULAR | Status: AC
  Filled 2016-04-26: qty 2

## 2016-04-26 MED ORDER — KCL IN DEXTROSE-NACL 20-5-0.45 MEQ/L-%-% IV SOLN
INTRAVENOUS | Status: DC
  Administered 2016-04-26 – 2016-04-28 (×3): via INTRAVENOUS
  Administered 2016-05-01: 50 mL/h via INTRAVENOUS
  Administered 2016-05-02 – 2016-05-03 (×3): via INTRAVENOUS
  Filled 2016-04-26 (×15): qty 1000

## 2016-04-26 MED ORDER — PANTOPRAZOLE SODIUM 40 MG PO TBEC
40.0000 mg | DELAYED_RELEASE_TABLET | Freq: Every day | ORAL | Status: DC
  Administered 2016-04-28 – 2016-05-06 (×9): 40 mg via ORAL
  Filled 2016-04-26 (×10): qty 1

## 2016-04-26 MED ORDER — PROPOFOL 10 MG/ML IV BOLUS
INTRAVENOUS | Status: DC | PRN
  Administered 2016-04-26: 180 mg via INTRAVENOUS

## 2016-04-26 MED ORDER — INSULIN ASPART 100 UNIT/ML ~~LOC~~ SOLN
0.0000 [IU] | Freq: Four times a day (QID) | SUBCUTANEOUS | Status: DC
  Administered 2016-04-27 – 2016-05-01 (×5): 2 [IU] via SUBCUTANEOUS

## 2016-04-26 MED ORDER — LACTATED RINGERS IV SOLN
INTRAVENOUS | Status: DC | PRN
  Administered 2016-04-26 (×2): via INTRAVENOUS

## 2016-04-26 MED ORDER — FENTANYL 40 MCG/ML IV SOLN
INTRAVENOUS | Status: AC
  Filled 2016-04-26: qty 25

## 2016-04-26 MED ORDER — BUPIVACAINE-EPINEPHRINE (PF) 0.5% -1:200000 IJ SOLN
INTRAMUSCULAR | Status: AC
  Filled 2016-04-26: qty 30

## 2016-04-26 MED ORDER — DIPHENHYDRAMINE HCL 50 MG/ML IJ SOLN: 12.5000 mg | Freq: Four times a day (QID) | INTRAMUSCULAR | Status: DC | PRN

## 2016-04-26 MED ORDER — HYDROMORPHONE HCL 1 MG/ML IJ SOLN
0.2500 mg | INTRAMUSCULAR | Status: DC | PRN
  Administered 2016-04-26 (×4): 0.5 mg via INTRAVENOUS

## 2016-04-26 MED ORDER — PROMETHAZINE HCL 25 MG/ML IJ SOLN: 6.2500 mg | INTRAMUSCULAR | Status: DC | PRN

## 2016-04-26 MED ORDER — ONDANSETRON HCL 4 MG/2ML IJ SOLN
INTRAMUSCULAR | Status: DC | PRN
  Administered 2016-04-26: 4 mg via INTRAVENOUS

## 2016-04-26 MED ORDER — SUGAMMADEX SODIUM 500 MG/5ML IV SOLN
INTRAVENOUS | Status: DC | PRN
  Administered 2016-04-26: 187 mg via INTRAVENOUS

## 2016-04-26 MED ORDER — BUPIVACAINE HCL (PF) 0.5 % IJ SOLN
INTRAMUSCULAR | Status: AC
  Filled 2016-04-26: qty 30

## 2016-04-26 MED ORDER — LIDOCAINE HCL (CARDIAC) 20 MG/ML IV SOLN
INTRAVENOUS | Status: DC | PRN
  Administered 2016-04-26: 50 mg via INTRAVENOUS

## 2016-04-26 MED ORDER — ROCURONIUM BROMIDE 100 MG/10ML IV SOLN
INTRAVENOUS | Status: DC | PRN
  Administered 2016-04-26: 50 mg via INTRAVENOUS
  Administered 2016-04-26: 10 mg via INTRAVENOUS

## 2016-04-26 MED ORDER — SODIUM CHLORIDE 0.9% FLUSH: 9.0000 mL | INTRAVENOUS | Status: DC | PRN

## 2016-04-26 MED ORDER — FENTANYL 40 MCG/ML IV SOLN
INTRAVENOUS | Status: DC
  Administered 2016-04-26: 60 ug via INTRAVENOUS
  Administered 2016-04-26: 17:00:00 via INTRAVENOUS
  Administered 2016-04-27: 105 ug via INTRAVENOUS
  Administered 2016-04-27: 90 ug via INTRAVENOUS
  Administered 2016-04-27: 75 ug via INTRAVENOUS
  Administered 2016-04-27: 150 ug via INTRAVENOUS
  Administered 2016-04-27: 85 ug via INTRAVENOUS
  Administered 2016-04-27: 75 ug via INTRAVENOUS
  Administered 2016-04-27: 23:00:00 via INTRAVENOUS
  Administered 2016-04-27: 240 ug via INTRAVENOUS
  Administered 2016-04-28: 135 ug via INTRAVENOUS
  Administered 2016-04-28: 90 ug via INTRAVENOUS
  Filled 2016-04-26: qty 25

## 2016-04-26 SURGICAL SUPPLY — 81 items
BIT DRILL 7/64X5 DISP (BIT) IMPLANT
BLADE SURG 11 STRL SS (BLADE) IMPLANT
BNDG GAUZE ELAST 4 BULKY (GAUZE/BANDAGES/DRESSINGS) ×3 IMPLANT
CANISTER SUCTION 2500CC (MISCELLANEOUS) ×6 IMPLANT
CATH KIT ON Q 5IN SLV (PAIN MANAGEMENT) IMPLANT
CATH THORACIC 28FR (CATHETERS) IMPLANT
CATH THORACIC 36FR (CATHETERS) IMPLANT
CATH THORACIC 36FR RT ANG (CATHETERS) IMPLANT
CLIP TI MEDIUM 24 (CLIP) IMPLANT
CLIP TI MEDIUM 6 (CLIP) ×3 IMPLANT
CONN ST 1/4X3/8  BEN (MISCELLANEOUS) ×6
CONN ST 1/4X3/8 BEN (MISCELLANEOUS) ×3 IMPLANT
CONN Y 3/8X3/8X3/8  BEN (MISCELLANEOUS) ×2
CONN Y 3/8X3/8X3/8 BEN (MISCELLANEOUS) ×1 IMPLANT
CONT SPEC 4OZ CLIKSEAL STRL BL (MISCELLANEOUS) ×6 IMPLANT
COVER SURGICAL LIGHT HANDLE (MISCELLANEOUS) ×3 IMPLANT
DERMABOND ADVANCED (GAUZE/BANDAGES/DRESSINGS) ×2
DERMABOND ADVANCED .7 DNX12 (GAUZE/BANDAGES/DRESSINGS) ×1 IMPLANT
DRAIN CHANNEL 32F RND 10.7 FF (WOUND CARE) ×9 IMPLANT
DRAPE LAPAROSCOPIC ABDOMINAL (DRAPES) ×3 IMPLANT
DRAPE WARM FLUID 44X44 (DRAPE) IMPLANT
DRSG PAD ABDOMINAL 8X10 ST (GAUZE/BANDAGES/DRESSINGS) ×3 IMPLANT
ELECT BLADE 4.0 EZ CLEAN MEGAD (MISCELLANEOUS) ×3
ELECT REM PT RETURN 9FT ADLT (ELECTROSURGICAL) ×3
ELECTRODE BLDE 4.0 EZ CLN MEGD (MISCELLANEOUS) ×1 IMPLANT
ELECTRODE REM PT RTRN 9FT ADLT (ELECTROSURGICAL) ×1 IMPLANT
GAUZE SPONGE 4X4 12PLY STRL (GAUZE/BANDAGES/DRESSINGS) ×3 IMPLANT
GLOVE BIO SURGEON STRL SZ 6 (GLOVE) ×3 IMPLANT
GLOVE BIO SURGEON STRL SZ 6.5 (GLOVE) ×2 IMPLANT
GLOVE BIO SURGEONS STRL SZ 6.5 (GLOVE) ×1
GLOVE BIOGEL PI IND STRL 6.5 (GLOVE) ×4 IMPLANT
GLOVE BIOGEL PI INDICATOR 6.5 (GLOVE) ×8
GLOVE EUDERMIC 7 POWDERFREE (GLOVE) ×6 IMPLANT
GLOVE SURG SS PI 7.0 STRL IVOR (GLOVE) ×6 IMPLANT
GOWN STRL REUS W/ TWL LRG LVL3 (GOWN DISPOSABLE) ×3 IMPLANT
GOWN STRL REUS W/ TWL XL LVL3 (GOWN DISPOSABLE) ×1 IMPLANT
GOWN STRL REUS W/TWL LRG LVL3 (GOWN DISPOSABLE) ×6
GOWN STRL REUS W/TWL XL LVL3 (GOWN DISPOSABLE) ×2
KIT BASIN OR (CUSTOM PROCEDURE TRAY) ×3 IMPLANT
KIT ROOM TURNOVER OR (KITS) ×3 IMPLANT
NS IRRIG 1000ML POUR BTL (IV SOLUTION) ×6 IMPLANT
PACK CHEST (CUSTOM PROCEDURE TRAY) ×3 IMPLANT
PAD ARMBOARD 7.5X6 YLW CONV (MISCELLANEOUS) ×6 IMPLANT
SEALANT SURG COSEAL 4ML (VASCULAR PRODUCTS) IMPLANT
SEALANT SURG COSEAL 8ML (VASCULAR PRODUCTS) IMPLANT
SOLUTION ANTI FOG 6CC (MISCELLANEOUS) ×3 IMPLANT
SPECIMEN JAR MEDIUM (MISCELLANEOUS) IMPLANT
SPONGE GAUZE 4X4 12PLY STER LF (GAUZE/BANDAGES/DRESSINGS) ×3 IMPLANT
STAPLER VISISTAT 35W (STAPLE) ×3 IMPLANT
SUT PROLENE 3 0 SH DA (SUTURE) IMPLANT
SUT PROLENE 4 0 RB 1 (SUTURE)
SUT PROLENE 4-0 RB1 .5 CRCL 36 (SUTURE) IMPLANT
SUT SILK  1 MH (SUTURE) ×4
SUT SILK 1 MH (SUTURE) ×2 IMPLANT
SUT SILK 1 TIES 10X30 (SUTURE) IMPLANT
SUT SILK 2 0SH CR/8 30 (SUTURE) IMPLANT
SUT SILK 3 0 SH CR/8 (SUTURE) IMPLANT
SUT VIC AB 1 CTX 36 (SUTURE) ×2
SUT VIC AB 1 CTX36XBRD ANBCTR (SUTURE) ×1 IMPLANT
SUT VIC AB 2 TP1 27 (SUTURE) ×3 IMPLANT
SUT VIC AB 2-0 CT1 27 (SUTURE) ×2
SUT VIC AB 2-0 CT1 TAPERPNT 27 (SUTURE) ×1 IMPLANT
SUT VIC AB 2-0 CTX 36 (SUTURE) IMPLANT
SUT VIC AB 2-0 UR6 27 (SUTURE) IMPLANT
SUT VIC AB 3-0 MH 27 (SUTURE) IMPLANT
SUT VIC AB 3-0 SH 27 (SUTURE)
SUT VIC AB 3-0 SH 27X BRD (SUTURE) IMPLANT
SUT VIC AB 3-0 X1 27 (SUTURE) ×3 IMPLANT
SWAB COLLECTION DEVICE MRSA (MISCELLANEOUS) ×3 IMPLANT
SWAB CULTURE ESWAB REG 1ML (MISCELLANEOUS) ×3 IMPLANT
SYSTEM SAHARA CHEST DRAIN ATS (WOUND CARE) ×3 IMPLANT
TAPE CLOTH SURG 4X10 WHT LF (GAUZE/BANDAGES/DRESSINGS) ×3 IMPLANT
TAPE PAPER 2X10 WHT MICROPORE (GAUZE/BANDAGES/DRESSINGS) ×3 IMPLANT
TAPE UMBILICAL 1/8 X36 TWILL (MISCELLANEOUS) IMPLANT
TIP APPLICATOR SPRAY EXTEND 16 (VASCULAR PRODUCTS) IMPLANT
TOWEL OR 17X24 6PK STRL BLUE (TOWEL DISPOSABLE) ×3 IMPLANT
TOWEL OR 17X26 10 PK STRL BLUE (TOWEL DISPOSABLE) ×3 IMPLANT
TRAP SPECIMEN MUCOUS 40CC (MISCELLANEOUS) IMPLANT
TRAY FOLEY CATH 16FRSI W/METER (SET/KITS/TRAYS/PACK) ×3 IMPLANT
TUNNELER SHEATH ON-Q 11GX8 DSP (PAIN MANAGEMENT) IMPLANT
WATER STERILE IRR 1000ML POUR (IV SOLUTION) ×3 IMPLANT

## 2016-04-26 NOTE — Transfer of Care (Signed)
Immediate Anesthesia Transfer of Care Note  Patient: Phylliss BobKatia D Stimpson  Procedure(s) Performed: Procedure(s): Right THORACOTOMY AND DRAINAGE OF EMPYEMA (Right)  Patient Location: PACU  Anesthesia Type:General  Level of Consciousness: awake, alert  and oriented  Airway & Oxygen Therapy: Patient Spontanous Breathing and Patient connected to nasal cannula oxygen  Post-op Assessment: Report given to RN and Post -op Vital signs reviewed and stable  Post vital signs: Reviewed and stable  Last Vitals:  Filed Vitals:   04/25/16 2133 04/26/16 0422  BP: 136/83 155/84  Pulse: 68 69  Temp: 36.9 C 36.9 C  Resp: 18 17    Last Pain:  Filed Vitals:   04/26/16 1127  PainSc: 2       Patients Stated Pain Goal: 2 (04/25/16 0215)  Complications: No apparent anesthesia complications

## 2016-04-26 NOTE — Brief Op Note (Signed)
02/09/2016 - 04/26/2016  3:48 PM  PATIENT:  [name] Bell  35 y.o. female  PRE-OPERATIVE DIAGNOSIS:  Large, loculated right empyema and pleural effusion  POST-OPERATIVE DIAGNOSIS:   Right loculate empyema and pleural effusion  PROCEDURE: RIGHT THORACOTOMY AND DRAINAGE OF EMPYEMA and effusion  FINDINGS: Purulence, clot (from previous rib fractures), and pleural fluid removed from right chest. Right lung is trapped in a dense peel  SURGEON:  Surgeon(s) and Role:    * Alleen BorneBryan K Bartle, MD - Primary  PHYSICIAN ASSISTANT: Doree Fudgeonielle Blain Hunsucker PA-C  ANESTHESIA:   general  EBL:  Total I/O In: 829562: 694444 [Z.H.:086578[I.V.:692197; NG/GT:1097; IV Piggyback:1150] Out: 570 [Urine:550; Blood:20]  BLOOD ADMINISTERED:none  DRAINS: 3 32 Blake drains placed in the right pleural space   SPECIMEN:  Source of Specimen:  Right pleural fluid, purulence  DISPOSITION OF SPECIMEN:  Culture and pathology  COUNTS CORRECT:  YES  DICTATION: .Dragon Dictation  PLAN OF CARE: Admit to inpatient   PATIENT DISPOSITION:  PACU - hemodynamically stable.   Delay start of Pharmacological VTE agent (>24hrs) due to surgical blood loss or risk of bleeding: yes

## 2016-04-26 NOTE — Anesthesia Preprocedure Evaluation (Addendum)
Anesthesia Evaluation  Patient identified by MRN, date of birth, ID band Patient awake    Reviewed: Allergy & Precautions, NPO status , Patient's Chart, lab work & pertinent test results  Airway Mallampati: II  TM Distance: >3 FB Neck ROM: Full    Dental   Pulmonary neg pulmonary ROS,  History noted. CE   breath sounds clear to auscultation       Cardiovascular negative cardio ROS   Rhythm:Regular Rate:Normal     Neuro/Psych    GI/Hepatic negative GI ROS, Neg liver ROS,   Endo/Other  negative endocrine ROS  Renal/GU Renal disease     Musculoskeletal   Abdominal   Peds  Hematology   Anesthesia Other Findings   Reproductive/Obstetrics                            Anesthesia Physical Anesthesia Plan  ASA: III  Anesthesia Plan: General   Post-op Pain Management:    Induction: Intravenous  Airway Management Planned: Oral ETT  Additional Equipment: CVP and Arterial line  Intra-op Plan:   Post-operative Plan: Possible Post-op intubation/ventilation  Informed Consent: I have reviewed the patients History and Physical, chart, labs and discussed the procedure including the risks, benefits and alternatives for the proposed anesthesia with the patient or authorized representative who has indicated his/her understanding and acceptance.   Dental advisory given  Plan Discussed with: CRNA and Anesthesiologist  Anesthesia Plan Comments:         Anesthesia Quick Evaluation

## 2016-04-26 NOTE — Progress Notes (Signed)
LOS: 77 days   Subjective: Patient experiencing some chest pain and dizziness this morning. Chest pain was retrosternal, mild and not reproducible, does not radiate anywhere. Denies SOB, nausea, diaphoresis. Having tenderness in right chest wall as well, less drainage than previously. Also c/o pain with swallowing.    Objective: Vital signs in last 24 hours: Temp:  [98.4 F (36.9 C)-98.5 F (36.9 C)] 98.5 F (36.9 C) (06/19 0422) Pulse Rate:  [68-69] 69 (06/19 0422) Resp:  [17-18] 17 (06/19 0422) BP: (136-155)/(83-84) 155/84 mmHg (06/19 0422) SpO2:  [98 %-99 %] 98 % (06/19 0422) Weight:  [93.45 kg (206 lb 0.3 oz)] 93.45 kg (206 lb 0.3 oz) (06/19 0422) Last BM Date: 04/23/16   Laboratory  CBC  Recent Labs  04/25/16 0232  WBC 14.1*  HGB 11.8*  HCT 38.1  PLT 325   BMET  Recent Labs  04/25/16 0915  NA 142  K 3.3*  CL 107  CO2 26  GLUCOSE 91  BUN 12  CREATININE 0.98  CALCIUM 8.6*     Physical Exam General: AOx3. Pleasant. No distress. Resp: CTAB, unlabored. ttp over right chest wall. Dressing with moderate drainage. Cardio: RRR, no M/G/R heard.  GI: soft, nontender. Normal bowel sounds    Assessment/Plan: Jump from hotel/multiple toxic ingestions -Continue on Zoloft, sitter for safety.  S/P ex lap, hepatorraphy, SBR, cholecystectomy, preperitoneal pelvic packing [name] 4/3  Ex lap for acidosis/bleeding 4/4 Thompson Ex lap for bleeding 4/5 [name] S/P ex fix pelvis [name] 4/3 S/P ex lap 4/7 Thompson S/P ex lap 4/10 Thompson S/P sacral screws 4/10 [name] S/P application ABRA, open gastrostomy tube 4/12 [name]  S/P abd closure, trach 4/24 [name] Mult B rib FX/R HPTX, B pulm contusions - IR placed Chest tube drain (03/27/16) for pleural effusion, TCTS following.Pigtail drain placed 5/20 and removed 04/10/16 Multiple toxic ingestions Mult L wrist FXs - splint until f/u with Dr. Melvyn Novasrtmann as OP, can WBAT Pelvic FX - S/P ex fix and angioembolization, S/P sacral  screw by Dr. Carola FrostHandy. Bed to chair transfers only, Pivot with to assist with transfers. ABL anemia Right chest wall abscess  - mild leukocytosis 14.1 yesterday, no fevers. Culture pending.  - continue with IV zosyn and Vanc - CT showed communication with pleural cavity - CT surgery taking to OR today. FEN - NPO for surgery. VTE - SCD's.  Dispo - OR today    Tresa EndoKelly Rayburn PA-S  04/26/2016

## 2016-04-26 NOTE — Progress Notes (Signed)
PT Cancellation Note  Patient Details Name: Phylliss BobKatia D Pacey MRN: [id number] DOB: 1981-10-09   Cancelled Treatment:    Reason Eval/Treat Not Completed: Other (comment)  Noted plan for VATS today.  Will hold PT and mobility at this time, and need updated orders post-op.     Sunny SchleinRitenour, Teller Wakefield F, [place] CarolinaPT [phone] 04/26/2016, 8:38 AM

## 2016-04-26 NOTE — Anesthesia Procedure Notes (Addendum)
Procedure Name: Intubation Date/Time: 04/26/2016 2:16 PM Performed by: Gwenyth AllegraADAMI, Leea Rambeau Pre-anesthesia Checklist: Emergency Drugs available, Patient identified, Timeout performed, Suction available and Patient being monitored Patient Re-evaluated:Patient Re-evaluated prior to inductionOxygen Delivery Method: Circle system utilized Preoxygenation: Pre-oxygenation with 100% oxygen Intubation Type: IV induction Ventilation: Oral airway inserted - appropriate to patient size Grade View: Grade I Endobronchial tube: Left and 37 Fr Number of attempts: 1 Airway Equipment and Method: Stylet Secured at: 29 cm Tube secured with: Tape Dental Injury: Teeth and Oropharynx as per pre-operative assessment     RIJ CVP Dual Lumen [id number]: The patient was identified and consent obtained.  TO was performed, and full barrier precautions were used.  The skin was anesthetized with lidocaine.  Once the vein was located with the 22 ga. needle using ultrasound guidance , the wire was inserted into the vein.  The wire location was confirmed with ultrasound.  The tissue was dilated and the catheter was carefully inserted, then sutured in place. A dressing was applied. The patient tolerated the procedure well.   CE

## 2016-04-27 ENCOUNTER — Encounter (HOSPITAL_COMMUNITY): Payer: Self-pay | Admitting: Surgery

## 2016-04-27 ENCOUNTER — Inpatient Hospital Stay (HOSPITAL_COMMUNITY): Payer: Medicaid Other

## 2016-04-27 LAB — GLUCOSE, CAPILLARY
GLUCOSE-CAPILLARY: 93 mg/dL (ref 65–99)
GLUCOSE-CAPILLARY: 102 mg/dL — AB (ref 65–99)
GLUCOSE-CAPILLARY: 130 mg/dL — AB (ref 65–99)
Glucose-Capillary: 123 mg/dL — ABNORMAL HIGH (ref 65–99)
Glucose-Capillary: 123 mg/dL — ABNORMAL HIGH (ref 65–99)

## 2016-04-27 LAB — BASIC METABOLIC PANEL
Anion gap: 7 (ref 5–15)
BUN: 13 mg/dL (ref 6–20)
CALCIUM: 8.1 mg/dL — AB (ref 8.9–10.3)
CO2: 24 mmol/L (ref 22–32)
CREATININE: 1.49 mg/dL — AB (ref 0.44–1.00)
Chloride: 113 mmol/L — ABNORMAL HIGH (ref 101–111)
GFR calc non Af Amer: 46 mL/min — ABNORMAL LOW (ref >60)
GFR, EST AFRICAN AMERICAN: 53 mL/min — AB (ref >60)
Glucose, Bld: 110 mg/dL — ABNORMAL HIGH (ref 65–99)
Potassium: 3 mmol/L — ABNORMAL LOW (ref 3.5–5.1)
SODIUM: 144 mmol/L (ref 135–145)

## 2016-04-27 LAB — CBC
HEMATOCRIT: 34.9 % — AB (ref 36.0–46.0)
Hemoglobin: 10.4 g/dL — ABNORMAL LOW (ref 12.0–15.0)
MCH: 27.6 pg (ref 26.0–34.0)
MCHC: 29.8 g/dL — AB (ref 30.0–36.0)
MCV: 92.6 fL (ref 78.0–100.0)
Platelets: 321 10*3/uL (ref 150–400)
RBC: 3.77 MIL/uL — ABNORMAL LOW (ref 3.87–5.11)
RDW: 15.9 % — AB (ref 11.5–15.5)
WBC: 23.1 10*3/uL — ABNORMAL HIGH (ref 4.0–10.5)

## 2016-04-27 MED ORDER — DEXTROSE 5 % IV SOLN
2.0000 g | Freq: Two times a day (BID) | INTRAVENOUS | Status: DC
  Administered 2016-04-27 – 2016-05-06 (×19): 2 g via INTRAVENOUS
  Filled 2016-04-27 (×22): qty 2
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
## 2016-04-27 NOTE — Progress Notes (Signed)
Trauma Service Note  Subjective: Patient looks great.  Says she is having problems coughing.  Objective: Vital signs in last 24 hours: Temp:  [97.4 F (36.3 C)-98.2 F (36.8 C)] 98.2 F (36.8 C) (06/20 0744) Pulse Rate:  [63-105] 75 (06/20 0700) Resp:  [8-28] 9 (06/20 0700) BP: (117-143)/(68-105) 139/82 mmHg (06/20 0700) SpO2:  [96 %-100 %] 99 % (06/20 0700) Last BM Date: 04/23/16  Intake/Output from previous day: 06/19 0701 - 06/20 0700 In: 409811.9 [P.O.:60; J.Y.:782956.2; ZH/YQ:6578; IV Piggyback:2250] Out: 2100 [Urine:1630; Blood:20; Chest Tube:450] Intake/Output this shift:    General: No acute distress  Lungs: Diminished on the right as expected.  CXR shows lung not fully expanded, but this is expected also.  Abd: Soft, benign, wound almost healed  Extremities: No changes  Neuro: Intact  Lab Results: CBC   Recent Labs  04/25/16 0232 04/27/16 0440  WBC 14.1* 23.1*  HGB 11.8* 10.4*  HCT 38.1 34.9*  PLT 325 321   BMET  Recent Labs  04/25/16 0915 04/27/16 0440  NA 142 144  K 3.3* 3.0*  CL 107 113*  CO2 26 24  GLUCOSE 91 110*  BUN 12 13  CREATININE 0.98 1.49*  CALCIUM 8.6* 8.1*   PT/INR No results for input(s): LABPROT, INR in the last 72 hours. ABG No results for input(s): PHART, HCO3 in the last 72 hours.  Invalid input(s): PCO2, PO2  Studies/Results: Dg Chest Port 1 View  04/27/2016  CLINICAL DATA:  Pneumothorax EXAM: PORTABLE CHEST 1 VIEW COMPARISON:  Yesterday FINDINGS: Tubular devices are stable. Stable right loculated pleural effusion. No pneumothorax. Normal heart size. Left lung is grossly clear. Right-sided rib deformities are noted. IMPRESSION: Stable right chest tubes. Stable loculated right pleural effusion. No pneumothorax. Electronically Signed   By: Jolaine Click M.D.   On: 04/27/2016 07:29   Dg Chest Port 1 View  04/26/2016  CLINICAL DATA:  Right chest tube.  Right central line. EXAM: PORTABLE CHEST 1 VIEW COMPARISON:  Chest  CT, 04/24/2016.  Chest radiograph, 04/22/2016 FINDINGS: Three discrete chest tubes curl within the right mid hemi thorax. There has been a subsequent significant decrease in opacification of the right hemi thorax consistent with removal of pleural fluid. Right internal jugular central venous line tip projects in the lower superior vena cava. There is no evidence of a pneumothorax on this semi-erect study. Left lung clear. IMPRESSION: 1. Significant improvement in right hemi thorax opacifications following placement 3 right-sided chest tubes. 2. Right IJ central venous line is well positioned. 3. No pneumothorax. Electronically Signed   By: Amie Portland M.D.   On: 04/26/2016 17:00    Anti-infectives: Anti-infectives    Start     Dose/Rate Route Frequency Ordered Stop   04/27/16 1000  ceFEPIme (MAXIPIME) 2 g in dextrose 5 % 50 mL IVPB     2 g 100 mL/hr over 30 Minutes Intravenous Every 12 hours 04/27/16 0818     04/22/16 1200  piperacillin-tazobactam (ZOSYN) IVPB 3.375 g  Status:  Discontinued     3.375 g 12.5 mL/hr over 240 Minutes Intravenous Every 6 hours 04/22/16 1039 04/22/16 1042   04/22/16 1200  vancomycin (VANCOCIN) 1,500 mg in sodium chloride 0.9 % 500 mL IVPB     1,500 mg 250 mL/hr over 120 Minutes Intravenous Every 12 hours 04/22/16 1133     04/22/16 1130  piperacillin-tazobactam (ZOSYN) IVPB 3.375 g  Status:  Discontinued     3.375 g 12.5 mL/hr over 240 Minutes Intravenous Every 8 hours  04/22/16 1043 04/27/16 0754   04/02/16 1200  fluconazole (DIFLUCAN) 40 MG/ML suspension 400 mg     400 mg Per Tube  Once 04/02/16 1026 04/02/16 1442   03/31/16 1000  sulfamethoxazole-trimethoprim (BACTRIM,SEPTRA) 200-40 MG/5ML suspension 40 mL  Status:  Discontinued     40 mL Per Tube Every 12 hours 03/31/16 0927 03/31/16 0956   03/31/16 1000  sulfamethoxazole-trimethoprim (BACTRIM,SEPTRA) 200-40 MG/5ML suspension 20 mL     20 mL Per Tube Every 6 hours 03/31/16 0956 04/03/16 2359   03/26/16 1030   cefTRIAXone (ROCEPHIN) 2 g in dextrose 5 % 50 mL IVPB  Status:  Discontinued     2 g 100 mL/hr over 30 Minutes Intravenous Every 24 hours 03/26/16 1023 03/31/16 0921   03/25/16 1000  ceFEPIme (MAXIPIME) 2 g in dextrose 5 % 50 mL IVPB  Status:  Discontinued     2 g 100 mL/hr over 30 Minutes Intravenous Every 12 hours 03/25/16 0829 03/26/16 1023   03/08/16 1400  levofloxacin (LEVAQUIN) IVPB 750 mg     750 mg 100 mL/hr over 90 Minutes Intravenous Every 24 hours 03/08/16 0912 03/10/16 1501   03/06/16 1400  ceFEPIme (MAXIPIME) 2 g in dextrose 5 % 50 mL IVPB  Status:  Discontinued     2 g 100 mL/hr over 30 Minutes Intravenous Every 12 hours 03/06/16 1344 03/08/16 0912   03/01/16 1330  ceFAZolin (ANCEF) IVPB 2g/100 mL premix     2 g 200 mL/hr over 30 Minutes Intravenous To [facility] 02/29/16 0917 03/01/16 1452   02/11/16 0600  piperacillin-tazobactam (ZOSYN) IVPB 3.375 g  Status:  Discontinued     3.375 g 100 mL/hr over 30 Minutes Intravenous 4 times per day 02/10/16 2214 02/22/16 0853   02/10/16 1730  piperacillin-tazobactam (ZOSYN) IVPB 3.375 g  Status:  Discontinued     3.375 g 12.5 mL/hr over 240 Minutes Intravenous 3 times per day 02/10/16 1727 02/10/16 2214      Assessment/Plan: s/p Procedure(s): Right THORACOTOMY AND DRAINAGE OF EMPYEMA Advance diet  Transfer out of the unit tomorrow to 3S  LOS: 78 days   Marta LamasJames O. Gae BonWyatt, III, MD, FACS [phone] Trauma Surgeon 04/27/2016

## 2016-04-27 NOTE — Progress Notes (Addendum)
1 Day Post-Op Procedure(s) (LRB): Right THORACOTOMY AND DRAINAGE OF EMPYEMA (Right) Subjective:  No complaints. Pain under control  Objective: Vital signs in last 24 hours: Temp:  [97.4 F (36.3 C)-98 F (36.7 C)] 97.4 F (36.3 C) (06/20 0347) Pulse Rate:  [63-105] 75 (06/20 0700) Cardiac Rhythm:  [-] Normal sinus rhythm (06/20 0400) Resp:  [8-28] 9 (06/20 0700) BP: (117-143)/(68-105) 139/82 mmHg (06/20 0700) SpO2:  [96 %-100 %] 99 % (06/20 0700)  Hemodynamic parameters for last 24 hours:    Intake/Output from previous day: 06/19 0701 - 06/20 0700 In: 409811.9697742.4 [P.O.:60; J.Y.:782956.2.V.:694335.4; ZH/YQ:6578G/GT:1097; IV Piggyback:2250] Out: 2100 [Urine:1630; Blood:20; Chest Tube:450] Intake/Output this shift:    General appearance: lethargic but appropriate Heart: regular rate and rhythm, S1, S2 normal, no murmur, click, rub or gallop Lungs: clear to auscultation bilaterally and slightly decreased on the right CT drainage is thin serosanguinous, no air leak  Lab Results:  Recent Labs  04/25/16 0232 04/27/16 0440  WBC 14.1* 23.1*  HGB 11.8* 10.4*  HCT 38.1 34.9*  PLT 325 321   BMET:  Recent Labs  04/25/16 0915 04/27/16 0440  NA 142 144  K 3.3* 3.0*  CL 107 113*  CO2 26 24  GLUCOSE 91 110*  BUN 12 13  CREATININE 0.98 1.49*  CALCIUM 8.6* 8.1*    PT/INR: No results for input(s): LABPROT, INR in the last 72 hours. ABG    Component Value Date/Time   PHART 7.508* 02/28/2016 0642   HCO3 25.5* 02/28/2016 0642   TCO2 26.5 02/28/2016 0642   ACIDBASEDEF 2.0 02/13/2016 0411   O2SAT 99.2 02/28/2016 0642   CBG (last 3)   Recent Labs  04/26/16 2030 04/26/16 2336 04/27/16 0346  GLUCAP 116* 123* 93    Assessment/Plan: S/P Procedure(s) (LRB): Right THORACOTOMY AND DRAINAGE OF EMPYEMA (Right)  She is afebrile and hemodynamically stable in sinus rhythm. Gram stain showed abundant G+ cocci. Continue vanc  pending cultures. Previous culture had grown Enterobacter resistant to  Zosyn so will switch to Maxipime which it was sensitive to.  Keep CT's to suction  Work on [employer]  Start dressing changes to chest wall abscess.   LOS: 78 days    Alleen BorneBryan K Bartle 04/27/2016

## 2016-04-27 NOTE — Progress Notes (Signed)
Pt sister at bedside and noticed pt did not have her personal cell phone at bedside. Sister informed of suicide precautions and policy and safety reasons. Sister remained upset. Cell phone is on unit with pt label. Sister had left for the night when RN returned to pt room. Pt informed cell phone was not lost. Will continue to monitor.

## 2016-04-27 NOTE — Care Management Note (Signed)
Case Management Note  Patient Details  Name: [name] Bell MRN: [id number] Date of Birth: 1981-09-11  Subjective/Objective:  Pt s/p thoracotomy with drainage of empyema on 04/26/16.  Currently in surgical ICU with chest tubes.                   Action/Plan: Will continue to follow closely for discharge planning as pt progresses.  Collaborating with psych CSW for [facility].    Expected Discharge Date:                  Expected Discharge Plan:  [facility] Hospital  In-House Referral:  Clinical Social Work  Discharge planning Services  CM Consult  Post Acute Care Choice:    Choice offered to:     DME Arranged:    DME Agency:     HH Arranged:    HH Agency:     Status of Service:  In process, will continue to follow  Medicare Important Message Given:    Date Medicare IM Given:    Medicare IM give by:    Date Additional Medicare IM Given:    Additional Medicare Important Message give by:     If discussed at Long Length of Stay Meetings, dates discussed:    Additional Comments:  Quintella BatonJulie W. Angela Platner, RN, BSN  Trauma/Neuro ICU Case Manager [phone]

## 2016-04-27 NOTE — Progress Notes (Signed)
   04/27/16 1100  Clinical Encounter Type  Visited With Patient  Visit Type Follow-up  Chaplain stepped in to visit with patient but was told immediately "I'm not in the mood." Chaplain said she would return at another time, then realized missed opportunity to explore this statement and will go back later. Jaz Mallick, Chaplain

## 2016-04-27 NOTE — Clinical Social Work Psych Note (Signed)
Psych CSW was informed patient would undergo a thoracotomy on 6/19 were she would then recover for a short while in ICU/stepdown.  Medical Director was updated.  Disposition continues to be inpatient psychiatric hospitalization once medically cleared.  Psych CSW will continue efforts for placement once stable to transfer.  [name] PennaGina Jasiah Buntin, LCSW [phone]  [facility] Licensed Clinical Social Worker

## 2016-04-27 NOTE — Progress Notes (Signed)
2320mcg/3ml Fentanyl wasted in sink by Marchelle FolksAmanda RN and Jaquala Fuller RN   Christmas Faraci RN

## 2016-04-27 NOTE — Progress Notes (Signed)
Pt refused any oral po medications tonight. Pt asked to be left alone so she can rest. Pt encouraged to take klonopin and informed of why med is ordered. Pt still refused. Sitter at bedside. Will continue to monitor.

## 2016-04-27 NOTE — Anesthesia Postprocedure Evaluation (Signed)
Anesthesia Post Note  Patient: [name] Bell  Procedure(s) Performed: Procedure(s) (LRB): Right THORACOTOMY AND DRAINAGE OF EMPYEMA (Right)  Patient location during evaluation: PACU Anesthesia Type: General Level of consciousness: awake Vital Signs Assessment: post-procedure vital signs reviewed and stable Respiratory status: spontaneous breathing Cardiovascular status: stable Anesthetic complications: no    Last Vitals:  Filed Vitals:   04/27/16 1400 04/27/16 1500  BP:  155/94  Pulse: 81 84  Temp:    Resp: 20 15    Last Pain:  Filed Vitals:   04/27/16 1514  PainSc: 8                  EDWARDS,Jourdin Connors
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
## 2016-04-28 LAB — COMPREHENSIVE METABOLIC PANEL
ALBUMIN: 1.9 g/dL — AB (ref 3.5–5.0)
ALK PHOS: 79 U/L (ref 38–126)
ALT: 8 U/L — ABNORMAL LOW (ref 14–54)
AST: 12 U/L — AB (ref 15–41)
Anion gap: 7 (ref 5–15)
BILIRUBIN TOTAL: 1 mg/dL (ref 0.3–1.2)
BUN: 11 mg/dL (ref 6–20)
CALCIUM: 8 mg/dL — AB (ref 8.9–10.3)
CO2: 22 mmol/L (ref 22–32)
Chloride: 116 mmol/L — ABNORMAL HIGH (ref 101–111)
Creatinine, Ser: 1.6 mg/dL — ABNORMAL HIGH (ref 0.44–1.00)
GFR calc Af Amer: 48 mL/min — ABNORMAL LOW (ref >60)
GFR calc non Af Amer: 42 mL/min — ABNORMAL LOW (ref >60)
GLUCOSE: 138 mg/dL — AB (ref 65–99)
Potassium: 3.1 mmol/L — ABNORMAL LOW (ref 3.5–5.1)
Sodium: 145 mmol/L (ref 135–145)
TOTAL PROTEIN: 5.8 g/dL — AB (ref 6.5–8.1)

## 2016-04-28 LAB — CBC
HEMATOCRIT: 32.6 % — AB (ref 36.0–46.0)
HEMOGLOBIN: 9.9 g/dL — AB (ref 12.0–15.0)
MCH: 27.9 pg (ref 26.0–34.0)
MCHC: 30.4 g/dL (ref 30.0–36.0)
MCV: 91.8 fL (ref 78.0–100.0)
Platelets: 320 10*3/uL (ref 150–400)
RBC: 3.55 MIL/uL — ABNORMAL LOW (ref 3.87–5.11)
RDW: 15.9 % — ABNORMAL HIGH (ref 11.5–15.5)
WBC: 20.7 10*3/uL — AB (ref 4.0–10.5)

## 2016-04-28 LAB — GLUCOSE, CAPILLARY
GLUCOSE-CAPILLARY: 107 mg/dL — AB (ref 65–99)
Glucose-Capillary: 119 mg/dL — ABNORMAL HIGH (ref 65–99)
Glucose-Capillary: 126 mg/dL — ABNORMAL HIGH (ref 65–99)
Glucose-Capillary: 91 mg/dL (ref 65–99)

## 2016-04-28 LAB — VANCOMYCIN, TROUGH: VANCOMYCIN TR: 60 ug/mL — AB (ref 10.0–20.0)

## 2016-04-28 MED ORDER — DIPHENHYDRAMINE HCL 50 MG/ML IJ SOLN: 12.5000 mg | Freq: Four times a day (QID) | INTRAMUSCULAR | Status: DC | PRN

## 2016-04-28 MED ORDER — NALOXONE HCL 0.4 MG/ML IJ SOLN: 0.4000 mg | INTRAMUSCULAR | Status: DC | PRN

## 2016-04-28 MED ORDER — DM-GUAIFENESIN ER 30-600 MG PO TB12
1.0000 | ORAL_TABLET | Freq: Two times a day (BID) | ORAL | Status: DC
  Administered 2016-04-28 – 2016-05-05 (×8): 1 via ORAL
  Filled 2016-04-28 (×23): qty 1

## 2016-04-28 MED ORDER — SODIUM CHLORIDE 0.9% FLUSH: 9.0000 mL | INTRAVENOUS | Status: DC | PRN

## 2016-04-28 MED ORDER — TRAMADOL HCL 50 MG PO TABS
50.0000 mg | ORAL_TABLET | Freq: Four times a day (QID) | ORAL | Status: DC
  Administered 2016-04-28 – 2016-05-01 (×7): 50 mg via ORAL
  Filled 2016-04-28 (×9): qty 1

## 2016-04-28 MED ORDER — DIPHENHYDRAMINE HCL 12.5 MG/5ML PO ELIX: 12.5000 mg | ORAL_SOLUTION | Freq: Four times a day (QID) | ORAL | Status: DC | PRN

## 2016-04-28 MED ORDER — ONDANSETRON HCL 4 MG/2ML IJ SOLN: 4.0000 mg | Freq: Four times a day (QID) | INTRAMUSCULAR | Status: DC | PRN

## 2016-04-28 MED ORDER — HYDROMORPHONE 1 MG/ML IV SOLN
INTRAVENOUS | Status: DC
  Administered 2016-04-28: 0.6 mg via INTRAVENOUS
  Administered 2016-04-28: 1.5 mg via INTRAVENOUS
  Administered 2016-04-28: 09:00:00 via INTRAVENOUS
  Administered 2016-04-28: 0.6 mg via INTRAVENOUS
  Administered 2016-04-29: 0.9 mg via INTRAVENOUS
  Administered 2016-04-29: 0 mg via INTRAVENOUS
  Administered 2016-04-29 (×2): 0.3 mg via INTRAVENOUS
  Administered 2016-04-30 (×2): 0 mg via INTRAVENOUS
  Administered 2016-04-30: 1.2 mg via INTRAVENOUS
  Administered 2016-04-30: 0.3 mg via INTRAVENOUS
  Administered 2016-04-30: 0 via INTRAVENOUS
  Administered 2016-05-01 (×2): 0.3 mg via INTRAVENOUS
  Administered 2016-05-01 (×4): 0 mg via INTRAVENOUS
  Administered 2016-05-02 (×2): 0.6 mg via INTRAVENOUS
  Administered 2016-05-02 (×2): 0.3 mg via INTRAVENOUS
  Administered 2016-05-02 (×2): 0.6 mg via INTRAVENOUS
  Administered 2016-05-03: 0.9 mL via INTRAVENOUS
  Administered 2016-05-03: 0.6 mg via INTRAVENOUS
  Administered 2016-05-03: 1.2 mg via INTRAVENOUS
  Filled 2016-04-28: qty 25

## 2016-04-28 NOTE — Progress Notes (Signed)
   04/28/16 1400  Clinical Encounter Type  Visited With Patient  Visit Type Follow-up  Chaplain tried again to visit patient, but she indicated that she did not want to talk. Jaylynn Mcaleer, Chaplain

## 2016-04-28 NOTE — Progress Notes (Signed)
Pharmacy Antibiotic Note  [name] Bell is a 35 y.o. female admitted on 02/09/2016 with suicide attempt.  Pharmacy has been consulted for Vancomycin dosing for wound infection, possible PNA.   Vancomycin trough this AM is high at 60, verified with RN that no vancomycin was hanging while this was drawn, elevated trough is likely due to Scr rising from 0.4>>1.6 over a period of 4 days  Plan: -Hold vancomycin for now -Check random vancomycin level with AM labs on 6/22  Height: 5\' 11"  (180.3 cm) Weight: 206 lb 0.3 oz (93.45 kg) IBW/kg (Calculated) : 70.8  Temp (24hrs), Avg:97.9 F (36.6 C), Min:97.4 F (36.3 C), Max:98.2 F (36.8 C)   Recent Labs Lab 04/22/16 1134 04/23/16 0623 04/25/16 0232 04/25/16 0915 04/27/16 0440 04/27/16 2330 04/28/16  WBC 20.8*  --  14.1*  --  23.1* 20.7*  --   CREATININE 0.43* 0.40*  --  0.98 1.49* 1.60*  --   VANCOTROUGH  --   --   --   --   --   --  60*    Estimated Creatinine Clearance: 63.7 mL/min (by C-G formula based on Cr of 1.6).    No Known Allergies  Abran DukeLedford, Woodie Degraffenreid 04/28/2016 1:18 AM

## 2016-04-28 NOTE — Progress Notes (Signed)
2 Days Post-Op Procedure(s) (LRB): Right THORACOTOMY AND DRAINAGE OF EMPYEMA (Right) Subjective:  No specific complaints  Objective: Vital signs in last 24 hours: Temp:  [97.9 F (36.6 C)-98.6 F (37 C)] 97.9 F (36.6 C) (06/21 0847) Pulse Rate:  [73-88] 81 (06/21 0700) Cardiac Rhythm:  [-] Normal sinus rhythm (06/21 0400) Resp:  [0-36] 0 (06/21 0809) BP: (134-156)/(82-130) 142/91 mmHg (06/21 0700) SpO2:  [96 %-100 %] 97 % (06/21 0809)  Hemodynamic parameters for last 24 hours:    Intake/Output from previous day: 06/20 0701 - 06/21 0700 In: 3270 [P.O.:120; I.V.:2400; IV Piggyback:750] Out: 2230 [Urine:1900; Chest Tube:330] Intake/Output this shift:    General appearance: alert and cooperative Heart: regular rate and rhythm, S1, S2 normal, no murmur, click, rub or gallop Lungs: clear to auscultation bilaterally thin drainage from chest tube,no air leak  Lab Results:  Recent Labs  04/27/16 0440 04/27/16 2330  WBC 23.1* 20.7*  HGB 10.4* 9.9*  HCT 34.9* 32.6*  PLT 321 320   BMET:  Recent Labs  04/27/16 0440 04/27/16 2330  NA 144 145  K 3.0* 3.1*  CL 113* 116*  CO2 24 22  GLUCOSE 110* 138*  BUN 13 11  CREATININE 1.49* 1.60*  CALCIUM 8.1* 8.0*    PT/INR: No results for input(s): LABPROT, INR in the last 72 hours. ABG    Component Value Date/Time   PHART 7.508* 02/28/2016 0642   HCO3 25.5* 02/28/2016 0642   TCO2 26.5 02/28/2016 0642   ACIDBASEDEF 2.0 02/13/2016 0411   O2SAT 99.2 02/28/2016 0642   CBG (last 3)   Recent Labs  04/27/16 1555 04/27/16 2344 04/28/16 0538  GLUCAP 130* 126* 91    Assessment/Plan: S/P Procedure(s) (LRB): Right THORACOTOMY AND DRAINAGE OF EMPYEMA (Right)  She is stable.  Chest tube output decreasing and is thin, serosanguinous. Will continue chest tubes to suction today and may be able to remove one tomorrow. Continue dressing changes to chest wall abscess  Follow up CXR tomorrow. Follow up on cultures: no ID  yet Continue Maxipime. Vanc on hold due to high trough with elevated creatinine.  Continue IS    LOS: 79 days    Alleen BorneBryan K Killian Schwer 04/28/2016
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
## 2016-04-29 ENCOUNTER — Inpatient Hospital Stay (HOSPITAL_COMMUNITY): Payer: Medicaid Other

## 2016-04-29 LAB — BASIC METABOLIC PANEL
Anion gap: 5 (ref 5–15)
BUN: 13 mg/dL (ref 6–20)
CO2: 23 mmol/L (ref 22–32)
CREATININE: 1.76 mg/dL — AB (ref 0.44–1.00)
Calcium: 8.5 mg/dL — ABNORMAL LOW (ref 8.9–10.3)
Chloride: 118 mmol/L — ABNORMAL HIGH (ref 101–111)
GFR calc Af Amer: 43 mL/min — ABNORMAL LOW (ref >60)
GFR, EST NON AFRICAN AMERICAN: 37 mL/min — AB (ref >60)
Glucose, Bld: 119 mg/dL — ABNORMAL HIGH (ref 65–99)
POTASSIUM: 3.4 mmol/L — AB (ref 3.5–5.1)
SODIUM: 146 mmol/L — AB (ref 135–145)

## 2016-04-29 LAB — TYPE AND SCREEN
ABO/RH(D): O POS
Antibody Screen: NEGATIVE
UNIT DIVISION: 0
UNIT DIVISION: 0
Unit division: 0
Unit division: 0

## 2016-04-29 LAB — GLUCOSE, CAPILLARY
GLUCOSE-CAPILLARY: 87 mg/dL (ref 65–99)
GLUCOSE-CAPILLARY: 121 mg/dL — AB (ref 65–99)
GLUCOSE-CAPILLARY: 109 mg/dL — AB (ref 65–99)
Glucose-Capillary: 128 mg/dL — ABNORMAL HIGH (ref 65–99)

## 2016-04-29 MED ORDER — HYDROCODONE-ACETAMINOPHEN 10-325 MG PO TABS: 1.0000 | ORAL_TABLET | ORAL | Status: DC | PRN

## 2016-04-29 MED ORDER — DOCUSATE SODIUM 283 MG RE ENEM
1.0000 | ENEMA | Freq: Two times a day (BID) | RECTAL | Status: DC | PRN
  Filled 2016-04-29: qty 1

## 2016-04-29 NOTE — Progress Notes (Addendum)
3 Days Post-Op Procedure(s) (LRB): Right THORACOTOMY AND DRAINAGE OF EMPYEMA (Right) Subjective:  No specific complaints. Tolerating wound dressing changes well  Objective: Vital signs in last 24 hours: Temp:  [97.5 F (36.4 C)-98.2 F (36.8 C)] 98.1 F (36.7 C) (06/22 0728) Pulse Rate:  [78-89] 79 (06/22 0728) Cardiac Rhythm:  [-] Normal sinus rhythm (06/22 0309) Resp:  [0-32] 20 (06/22 0728) BP: (118-156)/(80-106) 147/100 mmHg (06/22 0728) SpO2:  [96 %-99 %] 98 % (06/22 0728) Weight:  [94.6 kg (208 lb 8.9 oz)] 94.6 kg (208 lb 8.9 oz) (06/22 0500)  Hemodynamic parameters for last 24 hours:    Intake/Output from previous day: 06/21 0701 - 06/22 0700 In: 1551.7 [P.O.:330; I.V.:1121.7; IV Piggyback:100] Out: 220 [Chest Tube:220] Intake/Output this shift:    General appearance: alert and cooperative Heart: regular rate and rhythm, S1, S2 normal, no murmur, click, rub or gallop Lungs: clear to auscultation bilaterally chest tube output decreasing, serous.  Thoracotomy incision ok  Lab Results:  Recent Labs  04/27/16 0440 04/27/16 2330  WBC 23.1* 20.7*  HGB 10.4* 9.9*  HCT 34.9* 32.6*  PLT 321 320   BMET:  Recent Labs  04/27/16 0440 04/27/16 2330  NA 144 145  K 3.0* 3.1*  CL 113* 116*  CO2 24 22  GLUCOSE 110* 138*  BUN 13 11  CREATININE 1.49* 1.60*  CALCIUM 8.1* 8.0*    PT/INR: No results for input(s): LABPROT, INR in the last 72 hours. ABG    Component Value Date/Time   PHART 7.508* 02/28/2016 0642   HCO3 25.5* 02/28/2016 0642   TCO2 26.5 02/28/2016 0642   ACIDBASEDEF 2.0 02/13/2016 0411   O2SAT 99.2 02/28/2016 0642   CBG (last 3)   Recent Labs  04/28/16 1300 04/28/16 2307 04/29/16 0517  GLUCAP 119* 107* 87   CXR: aeration ok. Pleural thickening due to edema and inflammation  Assessment/Plan: S/P Procedure(s) (LRB): Right THORACOTOMY AND DRAINAGE OF EMPYEMA (Right)  She is progressing well. Will remove one of chest drains today. ID on  cultures still pending. Continue Maxipime. I suspect vanc level is still adequate given level of 60 yesterday and renal function. Pharmacy following. Will check BMET today to follow up on K+ and creat. Continue IS. Wet to dry dressings to chest wall abscess at old chest tube site   LOS: 80 days    Alleen BorneBryan K Bartle 04/29/2016

## 2016-04-29 NOTE — Progress Notes (Signed)
[facility] Progress Note  3 Days Post-Op  Subjective: Improving after surgery - still reports a lot of pain over right ribcage. Tolerating PO, but in small amounts. BM yesterday. Urinating.   Requests to stop miralax/ducolax because it causes "pain all over"; requests to have enemas instead.  Also requests to switch pain main from oxycodone, because it causes her pain and constipation.   Objective: Vital signs in last 24 hours: Temp:  [97.5 F (36.4 C)-98.2 F (36.8 C)] 98.1 F (36.7 C) (06/22 0728) Pulse Rate:  [78-89] 79 (06/22 0728) Resp:  [0-32] 20 (06/22 0728) BP: (118-156)/(80-106) 147/100 mmHg (06/22 0728) SpO2:  [96 %-99 %] 98 % (06/22 0728) Weight:  [94.6 kg (208 lb 8.9 oz)] 94.6 kg (208 lb 8.9 oz) (06/22 0500) Last BM Date: 04/27/16  Intake/Output from previous day: 06/21 0701 - 06/22 0700 In: 1551.7 [P.O.:330; I.V.:1121.7; IV Piggyback:100] Out: 220 [Chest Tube:220] Intake/Output this shift:    PE: Gen:  Alert, cooperative Card:  RRR, no M/G/R heard Pulm:  CTA, no W/R/R, TTP right chest wall Abd: Soft, NT, mildly distended, +BS, no HSM Ext:  No erythema, edema, or tenderness  Lab Results:   Recent Labs  04/27/16 0440 04/27/16 2330  WBC 23.1* 20.7*  HGB 10.4* 9.9*  HCT 34.9* 32.6*  PLT 321 320   BMET  Recent Labs  04/27/16 0440 04/27/16 2330  NA 144 145  K 3.0* 3.1*  CL 113* 116*  CO2 24 22  GLUCOSE 110* 138*  BUN 13 11  CREATININE 1.49* 1.60*  CALCIUM 8.1* 8.0*   PT/INR No results for input(s): LABPROT, INR in the last 72 hours. CMP     Component Value Date/Time   NA 145 04/27/2016 2330   K 3.1* 04/27/2016 2330   CL 116* 04/27/2016 2330   CO2 22 04/27/2016 2330   GLUCOSE 138* 04/27/2016 2330   BUN 11 04/27/2016 2330   CREATININE 1.60* 04/27/2016 2330   CALCIUM 8.0* 04/27/2016 2330   PROT 5.8* 04/27/2016 2330   ALBUMIN 1.9* 04/27/2016 2330   AST 12* 04/27/2016 2330   ALT 8* 04/27/2016 2330   ALKPHOS 79  04/27/2016 2330   BILITOT 1.0 04/27/2016 2330   GFRNONAA 42* 04/27/2016 2330   GFRAA 48* 04/27/2016 2330   Lipase  No results found for: LIPASE     Studies/Results: Dg Pelvis Comp Min 3v  04/27/2016  CLINICAL DATA:  Sacroiliac pinning EXAM: JUDET PELVIS - 3+ VIEW COMPARISON:  04/20/2016 FINDINGS: Two orthopedic screws extend across bilateral sacroiliac joints, stable without surrounding lucency. Normal alignment. Fracture of bilateral superior and inferior ischiopubic rami as before. Hips are intact. IMPRESSION: 1. Stable orthopedic pins across bilateral sacroiliac joints. Electronically Signed   By: Corlis Leak  Hassell M.D.   On: 04/27/2016 19:16    Anti-infectives: Anti-infectives    Start     Dose/Rate Route Frequency Ordered Stop   04/27/16 1000  ceFEPIme (MAXIPIME) 2 g in dextrose 5 % 50 mL IVPB     2 g 100 mL/hr over 30 Minutes Intravenous Every 12 hours 04/27/16 0818     04/22/16 1200  piperacillin-tazobactam (ZOSYN) IVPB 3.375 g  Status:  Discontinued     3.375 g 12.5 mL/hr over 240 Minutes Intravenous Every 6 hours 04/22/16 1039 04/22/16 1042   04/22/16 1200  vancomycin (VANCOCIN) 1,500 mg in sodium chloride 0.9 % 500 mL IVPB  Status:  Discontinued     1,500 mg 250 mL/hr over 120 Minutes Intravenous Every 12 hours 04/22/16  1133 04/28/16 0115   04/22/16 1130  piperacillin-tazobactam (ZOSYN) IVPB 3.375 g  Status:  Discontinued     3.375 g 12.5 mL/hr over 240 Minutes Intravenous Every 8 hours 04/22/16 1043 04/27/16 0754   04/02/16 1200  fluconazole (DIFLUCAN) 40 MG/ML suspension 400 mg     400 mg Per Tube  Once 04/02/16 1026 04/02/16 1442   03/31/16 1000  sulfamethoxazole-trimethoprim (BACTRIM,SEPTRA) 200-40 MG/5ML suspension 40 mL  Status:  Discontinued     40 mL Per Tube Every 12 hours 03/31/16 0927 03/31/16 0956   03/31/16 1000  sulfamethoxazole-trimethoprim (BACTRIM,SEPTRA) 200-40 MG/5ML suspension 20 mL     20 mL Per Tube Every 6 hours 03/31/16 0956 04/03/16 2359   03/26/16  1030  cefTRIAXone (ROCEPHIN) 2 g in dextrose 5 % 50 mL IVPB  Status:  Discontinued     2 g 100 mL/hr over 30 Minutes Intravenous Every 24 hours 03/26/16 1023 03/31/16 0921   03/25/16 1000  ceFEPIme (MAXIPIME) 2 g in dextrose 5 % 50 mL IVPB  Status:  Discontinued     2 g 100 mL/hr over 30 Minutes Intravenous Every 12 hours 03/25/16 0829 03/26/16 1023   03/08/16 1400  levofloxacin (LEVAQUIN) IVPB 750 mg     750 mg 100 mL/hr over 90 Minutes Intravenous Every 24 hours 03/08/16 0912 03/10/16 1501   03/06/16 1400  ceFEPIme (MAXIPIME) 2 g in dextrose 5 % 50 mL IVPB  Status:  Discontinued     2 g 100 mL/hr over 30 Minutes Intravenous Every 12 hours 03/06/16 1344 03/08/16 0912   03/01/16 1330  ceFAZolin (ANCEF) IVPB 2g/100 mL premix     2 g 200 mL/hr over 30 Minutes Intravenous To [facility] 02/29/16 0917 03/01/16 1452   02/11/16 0600  piperacillin-tazobactam (ZOSYN) IVPB 3.375 g  Status:  Discontinued     3.375 g 100 mL/hr over 30 Minutes Intravenous 4 times per day 02/10/16 2214 02/22/16 0853   02/10/16 1730  piperacillin-tazobactam (ZOSYN) IVPB 3.375 g  Status:  Discontinued     3.375 g 12.5 mL/hr over 240 Minutes Intravenous 3 times per day 02/10/16 1727 02/10/16 2214      Assessment/Plan S/p right thoracotomy and drainage of empyema, Dr. Laneta SimmersBartle; POD#2 - serosanguinous drainage, WBC's trending down - removal of one blake drain today, per cardiothoracic surgery. - continue wet-to-dry dressings to chest wall Fall from building Major depressive disorder - [facility] when medically stable   FEN: goal 6 ensure daily, regular diet  DVT proph:lovenox, SCDs ID: Maxipime, Cx pending; Vanc held due to increased SCr/high trough Dispo: stepdown, abx, pain control - [facility]   LOS: 80 days    [name] PhenixElizabeth S Simaan , [facility] [place] [place] [facility] 04/29/2016, 7:33 AM Pager: [phone] Mon-Fri 7:00 am-4:30 pm Sat-Sun 7:00 am-11:30 am

## 2016-04-30 ENCOUNTER — Inpatient Hospital Stay (HOSPITAL_COMMUNITY): Payer: Medicaid Other

## 2016-04-30 LAB — CBC
HCT: 34 % — ABNORMAL LOW (ref 36.0–46.0)
HEMOGLOBIN: 10.7 g/dL — AB (ref 12.0–15.0)
MCH: 28.5 pg (ref 26.0–34.0)
MCHC: 31.5 g/dL (ref 30.0–36.0)
MCV: 90.7 fL (ref 78.0–100.0)
Platelets: 338 10*3/uL (ref 150–400)
RBC: 3.75 MIL/uL — AB (ref 3.87–5.11)
RDW: 16.2 % — ABNORMAL HIGH (ref 11.5–15.5)
WBC: 15.7 10*3/uL — ABNORMAL HIGH (ref 4.0–10.5)

## 2016-04-30 LAB — GLUCOSE, CAPILLARY
Glucose-Capillary: 90 mg/dL (ref 65–99)
Glucose-Capillary: 117 mg/dL — ABNORMAL HIGH (ref 65–99)
Glucose-Capillary: 99 mg/dL (ref 65–99)

## 2016-04-30 MED ORDER — HYDRALAZINE HCL 20 MG/ML IJ SOLN
5.0000 mg | Freq: Four times a day (QID) | INTRAMUSCULAR | Status: DC | PRN
Start: 2016-04-30 — End: 2016-05-13
  Administered 2016-04-30 – 2016-05-02 (×6): 5 mg via INTRAVENOUS
  Filled 2016-04-30 (×6): qty 1

## 2016-04-30 NOTE — Progress Notes (Signed)
ANTIBIOTIC CONSULT NOTE - INITIAL  Pharmacy Consult for Cefepime Indication: Enterobacter infection  No Known Allergies  Patient Measurements: Height: 5\' 11"  (180.3 cm) Weight: 207 lb 10.8 oz (94.2 kg) IBW/kg (Calculated) : 70.8  Vital Signs: Temp: 97.7 F (36.5 C) (06/23 1123) Temp Source: Oral (06/23 1123) BP: 149/92 mmHg (06/23 1123) Pulse Rate: 87 (06/23 1123) Intake/Output from previous day: 06/22 0701 - 06/23 0700 In: 1660 [P.O.:1560; IV Piggyback:100] Out: 0  Intake/Output from this shift: Total I/O In: 122 [P.O.:122] Out: 120 [Chest Tube:120]  Labs:  Recent Labs  04/27/16 2330 04/29/16 0830 04/30/16 1000  WBC 20.7*  --  15.7*  HGB 9.9*  --  10.7*  PLT 320  --  338  CREATININE 1.60* 1.76*  --    Estimated Creatinine Clearance: 58.1 mL/min (by C-G formula based on Cr of 1.76).  Recent Labs  04/28/16  VANCOTROUGH 60*     Microbiology:   Medical History: History reviewed. No pertinent past medical history.  Medications:   Assessment:  ID: WBC 15.7 (down), afeb. Concerned for wound infection now with empyema s/p R thoracotomy 6/20. Chest wall abscess at old chest tube site. Scr up  Cefepime 4/29 >> 5/1; 5/18 x2 doses, 6/20>> Levofloxacin 5/1 >> 5/3 Ceftriaxone 5/19 >> 5/23 Bactrim 5/24 >> 5/27 Fluconazole 5/26 x1 Vancomycin 6/15>>6/21 --6/20 VT: 60  Zosyn 6/15 >>6/20  6/19 R empyema - Rare Enterobacter Cloacae 6/15 chest wound: abundant GPC, rare GNC>>normal flora 5/20 CT drainage: enterobacter (R roceph; sens bactrim, FQN) 5/17 ZO:[id number]:enterobacter (R only to cefazolin) 5/16 TA :few GPC in pairs on stain 5/6, 5/10 XB:JYNWGNTA:normal flora 4/26 TA: enterobacter cloacae (S- cefepime, cipro, aminoglycosides, Bactrim)  Goal of Therapy:  Eradication of infection  Plan:  -continue Cefepime 2g q12h  Sherrilyn Nairn S. Merilynn Finlandobertson, PharmD, BCPS Clinical Staff Pharmacist Pager [phone]  Misty Stanleyobertson, Niquan Charnley Stillinger 04/30/2016,1:17 PM

## 2016-04-30 NOTE — Progress Notes (Signed)
Patient continues to have runs of VTach. MD has not responded to first page. Just paged MD again. Patient stable. RN will continue to monitor.

## 2016-04-30 NOTE — Progress Notes (Addendum)
[address]       [place] [address]             [phone]      4 Days Post-Op Procedure(s) (LRB): Right THORACOTOMY AND DRAINAGE OF EMPYEMA (Right) Subjective: Feels ok, pain is controlled, not SOB  Objective: Vital signs in last 24 hours: Temp:  [97.4 F (36.3 C)-97.9 F (36.6 C)] 97.9 F (36.6 C) (06/23 0725) Pulse Rate:  [77-87] 82 (06/23 0725) Cardiac Rhythm:  [-] Normal sinus rhythm (06/23 0800) Resp:  [20-25] 22 (06/23 0725) BP: (142-160)/(91-101) 160/101 mmHg (06/23 0725) SpO2:  [96 %-100 %] 96 % (06/23 0725) Weight:  [207 lb 10.8 oz (94.2 kg)] 207 lb 10.8 oz (94.2 kg) (06/23 0450)  Hemodynamic parameters for last 24 hours:    Intake/Output from previous day: 06/22 0701 - 06/23 0700 In: 1660 [P.O.:1560; IV Piggyback:100] Out: 0  Intake/Output this shift: Total I/O In: 122 [P.O.:122] Out: 120 [Chest Tube:120]  General appearance: alert, cooperative and no distress Heart: regular rate and rhythm Lungs: clear anteriorly Abdomen: soft, nontender Wound: dressings CDI  Lab Results:  Recent Labs  04/27/16 2330  WBC 20.7*  HGB 9.9*  HCT 32.6*  PLT 320   BMET:  Recent Labs  04/27/16 2330 04/29/16 0830  NA 145 146*  K 3.1* 3.4*  CL 116* 118*  CO2 22 23  GLUCOSE 138* 119*  BUN 11 13  CREATININE 1.60* 1.76*  CALCIUM 8.0* 8.5*    PT/INR: No results for input(s): LABPROT, INR in the last 72 hours. ABG    Component Value Date/Time   PHART 7.508* 02/28/2016 0642   HCO3 25.5* 02/28/2016 0642   TCO2 26.5 02/28/2016 0642   ACIDBASEDEF 2.0 02/13/2016 0411   O2SAT 99.2 02/28/2016 0642   CBG (last 3)   Recent Labs  04/29/16 1650 04/29/16 2335 04/30/16 0501  GLUCAP 109* 128* 90    Meds Scheduled Meds: . bacitracin   Topical BID  . bisacodyl  10 mg Oral Daily  . ceFEPime (MAXIPIME) IV  2 g Intravenous Q12H  . clonazePAM  0.5 mg Oral QHS  . dextromethorphan-guaiFENesin  1 tablet Oral BID  . enoxaparin (LOVENOX)  injection  30 mg Subcutaneous Q12H  . feeding supplement (ENSURE ENLIVE)  237 mL Oral 6 X Daily  . ferrous sulfate  325 mg Oral TID WC  . HYDROmorphone   Intravenous Q4H  . insulin aspart  0-24 Units Subcutaneous Q6H  . pantoprazole  40 mg Oral Daily  . polyethylene glycol  17 g Oral Daily  . senna-docusate  1 tablet Oral QHS  . sertraline  100 mg Oral Daily  . traMADol  50 mg Oral Q6H   Continuous Infusions: . dextrose 5 % and 0.45 % NaCl with KCl 20 mEq/L 50 mL/hr at 04/28/16 2300   PRN Meds:.diphenhydrAMINE **OR** diphenhydrAMINE, docusate sodium, HYDROcodone-acetaminophen, ipratropium-albuterol, LORazepam, naloxone **AND** sodium chloride flush, ondansetron (ZOFRAN) IV, potassium chloride, simethicone  Xrays Dg Chest Port 1 View  04/30/2016  CLINICAL DATA:  Empyema. EXAM: PORTABLE CHEST 1 VIEW COMPARISON:  04/29/2016. FINDINGS: Right IJ line in stable position. Right chest tubes in stable position. Cardiomegaly. Surgical staples noted over the right chest. Right pleural thickening again noted. Multiple right rib fractures are again noted. IMPRESSION: 1. Right IJ line and right chest tubes in stable position. No pneumothorax. 2. Stable right pleural effusion. Multiple displaced right rib fractures again noted. Surgical staples noted over the right chest. Electronically Signed  ByMaisie Fus: Thomas  Register   On: 04/30/2016 08:18   Dg Chest Port 1 View  04/29/2016  CLINICAL DATA:  Tube drainage of empyema, followup EXAM: PORTABLE CHEST 1 VIEW COMPARISON:  Portable chest x-ray of 04/27/2016 FINDINGS: Capping over the right apex again is noted most consistent with a right pleural effusion with some volume loss throughout the right hemi thorax. Aeration has improved slightly. Right chest tubes are present and no pneumothorax is seen. The left lung is clear. Heart size is stable. Right IJ central venous line tip overlies the mid SVC. IMPRESSION: 1. Slightly improved aeration. 2. Right apical opacity  probably represents right pleural effusion and atelectasis. Electronically Signed   By: Dwyane DeePaul  Barry M.D.   On: 04/29/2016 08:08   Results for orders placed or performed during the hospital encounter of 02/09/16  MRSA PCR Screening     Status: None   Collection Time: 02/11/16 11:30 AM  Result Value Ref Range Status   MRSA by PCR NEGATIVE NEGATIVE Final    Comment:        The GeneXpert MRSA Assay (FDA approved for NASAL specimens only), is one component of a comprehensive MRSA colonization surveillance program. It is not intended to diagnose MRSA infection nor to guide or monitor treatment for MRSA infections.   Anaerobic culture     Status: None   Collection Time: 02/26/16  1:19 PM  Result Value Ref Range Status   Specimen Description FLUID PLEURAL RIGHT  Final   Special Requests POF ANCEF  Final   Gram Stain   Final    DEGENERATED CELLULAR MATERIAL PRESENT NO ORGANISMS SEEN    Culture NO ANAEROBES ISOLATED  Final   Report Status 03/02/2016 FINAL  Final  Body fluid culture     Status: None   Collection Time: 02/26/16  1:19 PM  Result Value Ref Range Status   Specimen Description FLUID RIGHT PLEURAL  Final   Special Requests POF ANCEF  Final   Gram Stain   Final    DEGENERATED CELLULAR MATERIAL PRESENT NO ORGANISMS SEEN    Culture   Final    MULTIPLE ORGANISMS PRESENT, NONE PREDOMINANT CRITICAL RESULT CALLED TO, READ BACK BY AND VERIFIED WITH: J PINEDA 02/27/16 @ 1314 M VESTAL    Report Status 02/28/2016 FINAL  Final  Culture, respiratory (NON-Expectorated)     Status: None   Collection Time: 03/03/16  1:17 PM  Result Value Ref Range Status   Specimen Description TRACHEAL ASPIRATE  Final   Special Requests Normal  Final   Gram Stain   Final    FEW WBC PRESENT,BOTH PMN AND MONONUCLEAR NO SQUAMOUS EPITHELIAL CELLS SEEN NO ORGANISMS SEEN Performed at [facility]    Culture   Final    FEW ENTEROBACTER CLOACAE Performed at [facility]    Report  Status 03/06/2016 FINAL  Final   Organism ID, Bacteria ENTEROBACTER CLOACAE  Final      Susceptibility   Enterobacter cloacae - MIC*    CEFAZOLIN >=64 RESISTANT Resistant     CEFEPIME <=1 SENSITIVE Sensitive     CEFTAZIDIME >=64 RESISTANT Resistant     CEFTRIAXONE >=64 RESISTANT Resistant     CIPROFLOXACIN <=0.25 SENSITIVE Sensitive     GENTAMICIN <=1 SENSITIVE Sensitive     IMIPENEM <=0.25 SENSITIVE Sensitive     PIP/TAZO >=128 RESISTANT Resistant     TOBRAMYCIN <=1 SENSITIVE Sensitive     TRIMETH/SULFA Value in next row Sensitive      <=20 SENSITIVE(NOTE)    *  FEW ENTEROBACTER CLOACAE  Culture, respiratory (NON-Expectorated)     Status: None   Collection Time: 03/13/16  3:50 PM  Result Value Ref Range Status   Specimen Description TRACHEAL ASPIRATE  Final   Special Requests Normal  Final   Gram Stain   Final    MODERATE WBC PRESENT, PREDOMINANTLY PMN MODERATE SQUAMOUS EPITHELIAL CELLS PRESENT FEW GRAM POSITIVE COCCI IN PAIRS IN CLUSTERS Performed at [facility]    Culture   Final    NORMAL OROPHARYNGEAL FLORA Performed at [facility]    Report Status 03/16/2016 FINAL  Final  Culture, respiratory (NON-Expectorated)     Status: None   Collection Time: 03/17/16 11:32 AM  Result Value Ref Range Status   Specimen Description TRACHEAL ASPIRATE  Final   Special Requests Normal  Final   Gram Stain   Final    FEW WBC PRESENT,BOTH PMN AND MONONUCLEAR RARE SQUAMOUS EPITHELIAL CELLS PRESENT NO ORGANISMS SEEN Performed at [facility]    Culture   Final    NORMAL OROPHARYNGEAL FLORA Performed at [facility]    Report Status 03/19/2016 FINAL  Final  Culture, respiratory (NON-Expectorated)     Status: None   Collection Time: 03/23/16  9:33 AM  Result Value Ref Range Status   Specimen Description TRACHEAL ASPIRATE  Final   Special Requests Normal  Final   Gram Stain   Final    ABUNDANT WBC PRESENT,BOTH PMN AND MONONUCLEAR FEW SQUAMOUS  EPITHELIAL CELLS PRESENT FEW GRAM POSITIVE COCCI IN PAIRS Performed at [facility]    Culture   Final    MODERATE ENTEROBACTER CLOACAE Performed at [facility]    Report Status 03/26/2016 FINAL  Final   Organism ID, Bacteria ENTEROBACTER CLOACAE  Final      Susceptibility   Enterobacter cloacae - MIC*    CEFAZOLIN >=64 RESISTANT Resistant     CEFEPIME <=1 SENSITIVE Sensitive     CEFTAZIDIME <=1 SENSITIVE Sensitive     CEFTRIAXONE <=1 SENSITIVE Sensitive     CIPROFLOXACIN <=0.25 SENSITIVE Sensitive     GENTAMICIN <=1 SENSITIVE Sensitive     IMIPENEM 0.5 SENSITIVE Sensitive     PIP/TAZO 8 SENSITIVE Sensitive     TOBRAMYCIN <=1 SENSITIVE Sensitive     TRIMETH/SULFA Value in next row Sensitive      <=20 SENSITIVE(NOTE)    * MODERATE ENTEROBACTER CLOACAE  Culture, routine-abscess     Status: None   Collection Time: 03/27/16  2:03 PM  Result Value Ref Range Status   Specimen Description ABSCESS RIGHT CHEST DRAINAGE  Final   Special Requests Normal  Final   Gram Stain   Final    ABUNDANT WBC PRESENT, PREDOMINANTLY PMN RARE SQUAMOUS EPITHELIAL CELLS PRESENT ABUNDANT GRAM POSITIVE COCCI IN PAIRS Performed at [facility]    Culture   Final    FEW ENTEROBACTER CLOACAE Performed at [facility]    Report Status 03/30/2016 FINAL  Final   Organism ID, Bacteria ENTEROBACTER CLOACAE  Final      Susceptibility   Enterobacter cloacae - MIC*    CEFAZOLIN >=64 RESISTANT Resistant     CEFEPIME <=1 SENSITIVE Sensitive     CEFTAZIDIME >=64 RESISTANT Resistant     CEFTRIAXONE >=64 RESISTANT Resistant     CIPROFLOXACIN <=0.25 SENSITIVE Sensitive     GENTAMICIN <=1 SENSITIVE Sensitive     IMIPENEM <=0.25 SENSITIVE Sensitive     PIP/TAZO >=128 RESISTANT Resistant     TOBRAMYCIN <=1 SENSITIVE  Sensitive     TRIMETH/SULFA Value in next row Sensitive      <=20 SENSITIVE(NOTE)    * FEW ENTEROBACTER CLOACAE  MRSA PCR Screening     Status: None   Collection  Time: 03/30/16  6:51 PM  Result Value Ref Range Status   MRSA by PCR NEGATIVE NEGATIVE Final    Comment:        The GeneXpert MRSA Assay (FDA approved for NASAL specimens only), is one component of a comprehensive MRSA colonization surveillance program. It is not intended to diagnose MRSA infection nor to guide or monitor treatment for MRSA infections.   Aerobic Culture (superficial specimen)     Status: None   Collection Time: 04/22/16 10:55 AM  Result Value Ref Range Status   Specimen Description WOUND CHEST  Final   Special Requests NONE  Final   Gram Stain   Final    ABUNDANT WBC PRESENT, PREDOMINANTLY PMN ABUNDANT GRAM POSITIVE COCCI IN PAIRS IN CLUSTERS RARE GRAM NEGATIVE COCCOBACILLI    Culture NORMAL SKIN FLORA  Final   Report Status 04/24/2016 FINAL  Final  Aerobic/Anaerobic Culture (surgical/deep wound)     Status: None (Preliminary result)   Collection Time: 04/26/16  2:50 PM  Result Value Ref Range Status   Specimen Description WOUND RIGHT CHEST EMPYEMA  Final   Special Requests SWABS PT ON VANCOMYCIN  Final   Gram Stain   Final    RARE WBC PRESENT,BOTH PMN AND MONONUCLEAR FEW GRAM POSITIVE COCCI    Culture   Final    NORMAL SKIN FLORA NO ANAEROBES ISOLATED; CULTURE IN PROGRESS FOR 5 DAYS    Report Status PENDING  Incomplete  Aerobic/Anaerobic Culture (surgical/deep wound)     Status: None (Preliminary result)   Collection Time: 04/26/16  3:05 PM  Result Value Ref Range Status   Specimen Description WOUND RIGHT CHEST EMPYEMA  Final   Special Requests PT ON VANC  Final   Gram Stain   Final    MODERATE WBC PRESENT,BOTH PMN AND MONONUCLEAR MODERATE GRAM POSITIVE COCCI IN PAIRS IN CHAINS    Culture   Final    RARE GRAM NEGATIVE RODS IDENTIFICATION AND SUSCEPTIBILITIES TO FOLLOW    Report Status PENDING  Incomplete    Assessment/Plan: S/P Procedure(s) (LRB): Right THORACOTOMY AND DRAINAGE OF EMPYEMA (Right)  1 CT drainage not recorded, no air leak,  serosang drainage. Poss d/c another tube soon 2 micro noted above- on maxipime - organism pending. No recent Vanc level(60 on 6/21) 3 push rehab as able  LOS: 81 days    GOLD,WAYNE E 04/30/2016   Chart reviewed, patient examined, agree with above. CXR ok. Aeration stable. There is pleural thickening which will take a long time to resolve. Remove the more anterior chest tube. Continue dressing changes to chest wall abscess. Operative cultures have only shown normal skin flora. I think Maxipime should be adequate coverage.

## 2016-04-30 NOTE — Progress Notes (Signed)
Physical Therapy Re- Evaluation Patient Details Name: [name] BobKatia D Heap MRN: [id number] DOB: [birth year] Today's Date: 04/30/2016   History of Present Illness  [name] Bell is a 35 y.o. female.She is obese. PMH unknown.Pt attempted suicide: taped DNR note to chest, drank Drano, then jumped off 5 story building ([address]).S/p 02/09/16 gel foam embolization of bil internal iliac arteries.S/p 02/09/16 ex lap with cholecystectomy, resection SB and mesenteric repair, liver lac repair, application or wound vac.S/p 02/09/16 closed reduction of pelvic ring, external pelvic fixation.Also has right metacarpal fracture in splint. Pt on and off intubation via trach/ and on/off trac collar.  Pelvic x- fixator pins removed at bedside on 03/23/16.  Underwent thoracotomy 6/19 due to empyema/abcess with 2 chest tubes, one d/c today 04/30/16.  Clinical Impression  Patient presenting now s/p thoracotomy with continued skilled PT needs due to deficits listed in problem list below.  She will need follow up PT at psychiatric hospital with equipment noted below.  She may respond to a more functional approach using various activities to engage in standing/gait activities.  Today she declined standing due to just having had chest tube out.  Will follow along during acute stay.     Follow Up Recommendations Other (comment) (inpatient psychiatric hospital)    Equipment Recommendations  Wheelchair (measurements PT);Wheelchair cushion (measurements PT);Hospital bed;Other (comment);3in1 (PT)    Recommendations for Other Services       Precautions / Restrictions Precautions Precautions: Fall Precaution Comments: chest tube R Required Braces or Orthoses: Other Brace/Splint Other Brace/Splint: L wrist splint, bilateral PRAFO's Restrictions RLE Weight Bearing: Weight bearing as tolerated LLE Weight Bearing: Weight bearing as tolerated Other Position/Activity Restrictions: WBAT for gait training beginning 04/30/16      Mobility  Bed Mobility Overal bed mobility: Needs Assistance Bed Mobility: Supine to Sit;Sit to Supine     Supine to sit: HOB elevated;Mod assist Sit to supine: Mod assist;+2 for physical assistance   General bed mobility comments: to EOB assist for initiating with LE' and to pull trunk upright; then to supine assist for legs into bed and for positioning hips, head and [facility]  Transfers                 General transfer comment: multiple attempts at engaging in transfers and even gait training; pt initially agreeable to bed exercise only, then returned later and pt agreed to EOB only due to medicated and just had one chest tube removed  Ambulation/Gait                Stairs            Wheelchair Mobility    Modified Rankin (Stroke Patients Only)       Balance Overall balance assessment: Needs assistance Sitting-balance support: Feet supported Sitting balance-[name]: Good Sitting balance - Comments: able to perfom exercises seated EOB without LOB; sitting about 15 minutes                                     Pertinent Vitals/Pain Pain Assessment: Faces Faces Pain Scale: Hurts even more Pain Location: R side due to tube Pain Descriptors / Indicators: Aching;Grimacing;Guarding Pain Intervention(s): Monitored during session;Premedicated before session    Home Living                        Prior Function  Hand Dominance        Extremity/Trunk Assessment     RUE Deficits / Details: AAROM limited to about 80 degrees shoulder flexion due to pain along R lateral with chest tube; strength elbow flexion at least 3+/5     LUE Deficits / Details: AAROM shoulder flexion at least 95; strength 3+/5   Lower Extremity Assessment: RLE deficits/detail;LLE deficits/detail RLE Deficits / Details: AAROM WFL, strength hip flexion 3-/5, knee extension at least 3+/5, ankle DF 3+/5 LLE Deficits /  Details: AAROM WFL, strength hip flexion 2+/5, knee extension at least 3+/5, ankle DF 3+/5  Cervical / Trunk Assessment: Other exceptions  Communication      Cognition Arousal/Alertness: Awake/alert Behavior During Therapy: Flat affect Overall Cognitive Status: Within Functional Limits for tasks assessed                      General Comments General comments (skin integrity, edema, etc.): VSS at EOB for therex for 15 minutes today.  Discussed options for activities for future treatments to include activities from CIR such as puzzles, ring toss, cards, crafts, Wii, etc.  Patient reported would like to try any of these activities    Exercises General Exercises - Upper Extremity Shoulder Flexion: AAROM;10 reps;5 reps;Left;Right;Seated Elbow Flexion: AROM;Both;10 reps;Seated General Exercises - Lower Extremity Ankle Circles/Pumps: AROM;Both;10 reps;Seated Long Arc Quad: AROM;Both;10 reps;Seated Hip ABduction/ADduction: AROM;Both;10 reps;Seated (squeezing pillow between knees w/ 5 sec hold) Hip Flexion/Marching: AROM;Both;5 reps;Seated;AAROM      Assessment/Plan    PT Assessment Patient needs continued PT services  PT Diagnosis Difficulty walking;Acute pain;Generalized weakness   PT Problem List Decreased strength;Pain;Decreased activity tolerance;Decreased knowledge of use of DME;Decreased balance;Decreased mobility;Decreased knowledge of precautions;Decreased safety awareness  PT Treatment Interventions DME instruction;Gait training;Balance training;Functional mobility training;Patient/family education;Therapeutic activities;Therapeutic exercise   PT Goals (Current goals can be found in the Care Plan section) Acute Rehab PT Goals Patient Stated Goal: None stated PT Goal Formulation: With patient Time For Goal Achievement: 05/14/16 Potential to Achieve Goals: Good    Frequency Min 5X/week   Barriers to discharge        Co-evaluation               End of  Session   Activity Tolerance: Patient tolerated treatment well Patient left: in bed;with call bell/phone within reach;with nursing/sitter in room           Time: [id number] PT Time Calculation (min) (ACUTE ONLY): 19 min   Charges:   PT Evaluation $PT Re-evaluation: 1 Procedure     PT G CodesElray Bell:        [name] Bell 04/30/2016, 3:39 PM [name] Bell, PT [phone] 04/30/2016

## 2016-04-30 NOTE — Progress Notes (Signed)
Orthopedic Tech Progress Note Patient Details:  [name] BobKatia D Bell Jul 29, 1981 [id number]  Ortho Devices Type of Ortho Device: Postop shoe/boot Ortho Device/Splint Location: Bilateral heel boots Ortho Device/Splint Interventions: Application   [name] 04/30/2016, 11:39 AM

## 2016-04-30 NOTE — Clinical Social Work Psych Note (Signed)
Psych CSW continuing to follow and assist with disposition pending medical readiness.  [name] PennaGina Kendal Raffo, LCSW [phone]  [facility] Line Licensed Clinical Social Worker

## 2016-04-30 NOTE — Progress Notes (Signed)
Orthopaedic Trauma Service (OTS)   Subjective: Patient reports pain as mild in bed but weakness that morphs into pain with weight bearing.    Objective: Temp:  [97.4 F (36.3 C)-97.9 F (36.6 C)] 97.9 F (36.6 C) (06/23 0725) Pulse Rate:  [77-87] 82 (06/23 0725) Resp:  [20-25] 22 (06/23 0725) BP: (142-160)/(91-101) 160/101 mmHg (06/23 0725) SpO2:  [96 %-100 %] 96 % (06/23 0725) Weight:  [207 lb 10.8 oz (94.2 kg)] 207 lb 10.8 oz (94.2 kg) (06/23 0450) Physical Exam R&LLE   No edema/ swelling  Sens: DPN, SPN, TN intact  Motor: EHL, FHL, and lessor toe ext and flex all intact   Assessment/Plan: Day 81 1. Continue WBAT bliat LE 2. Appreciate PT!  Myrene GalasMichael Abree Romick, MD [facility] [phone] (p)

## 2016-04-30 NOTE — Progress Notes (Signed)
[facility] Progress Note  4 Days Post-Op  Subjective: Laying in bed, sitter at bedside. Pain and appetite improving. Denies N/V. 2.5 ensures as well as some solid food yesterday. BM this am. Urinating. Discussed the need for working with therapies today with the patient.   HTN up to 160/101 this AM @ 0725  Objective: Vital signs in last 24 hours: Temp:  [97.4 F (36.3 C)-97.9 F (36.6 C)] 97.9 F (36.6 C) (06/23 0725) Pulse Rate:  [77-87] 82 (06/23 0725) Resp:  [20-25] 22 (06/23 0725) BP: (142-160)/(91-101) 160/101 mmHg (06/23 0725) SpO2:  [96 %-100 %] 96 % (06/23 0725) Weight:  [94.2 kg (207 lb 10.8 oz)] 94.2 kg (207 lb 10.8 oz) (06/23 0450) Last BM Date: 04/29/16  Intake/Output from previous day: 06/22 0701 - 06/23 0700 In: 1660 [P.O.:1560; IV Piggyback:100] Out: 0  Intake/Output this shift: Total I/O In: 122 [P.O.:122] Out: 120 [Chest Tube:120]  PE: Gen: Alert, cooperative Card: RRR, no M/G/R heard Pulm: CTA, no W/R/R, TTP right chest wall; Blake drains in place on suction. Serosanguinous output. Abd: Soft, NT, mildly distended, +BS, no HSM Ext: No erythema, edema, or tenderness  Lab Results:   Recent Labs  04/27/16 2330  WBC 20.7*  HGB 9.9*  HCT 32.6*  PLT 320   BMET  Recent Labs  04/27/16 2330 04/29/16 0830  NA 145 146*  K 3.1* 3.4*  CL 116* 118*  CO2 22 23  GLUCOSE 138* 119*  BUN 11 13  CREATININE 1.60* 1.76*  CALCIUM 8.0* 8.5*   PT/INR No results for input(s): LABPROT, INR in the last 72 hours. CMP     Component Value Date/Time   NA 146* 04/29/2016 0830   K 3.4* 04/29/2016 0830   CL 118* 04/29/2016 0830   CO2 23 04/29/2016 0830   GLUCOSE 119* 04/29/2016 0830   BUN 13 04/29/2016 0830   CREATININE 1.76* 04/29/2016 0830   CALCIUM 8.5* 04/29/2016 0830   PROT 5.8* 04/27/2016 2330   ALBUMIN 1.9* 04/27/2016 2330   AST 12* 04/27/2016 2330   ALT 8* 04/27/2016 2330   ALKPHOS 79 04/27/2016 2330   BILITOT 1.0 04/27/2016  2330   GFRNONAA 37* 04/29/2016 0830   GFRAA 43* 04/29/2016 0830   Lipase  No results found for: LIPASE     Studies/Results: Dg Chest Port 1 View  04/30/2016  CLINICAL DATA:  Empyema. EXAM: PORTABLE CHEST 1 VIEW COMPARISON:  04/29/2016. FINDINGS: Right IJ line in stable position. Right chest tubes in stable position. Cardiomegaly. Surgical staples noted over the right chest. Right pleural thickening again noted. Multiple right rib fractures are again noted. IMPRESSION: 1. Right IJ line and right chest tubes in stable position. No pneumothorax. 2. Stable right pleural effusion. Multiple displaced right rib fractures again noted. Surgical staples noted over the right chest. Electronically Signed   By: Maisie Fushomas  Register   On: 04/30/2016 08:18   Dg Chest Port 1 View  04/29/2016  CLINICAL DATA:  Tube drainage of empyema, followup EXAM: PORTABLE CHEST 1 VIEW COMPARISON:  Portable chest x-ray of 04/27/2016 FINDINGS: Capping over the right apex again is noted most consistent with a right pleural effusion with some volume loss throughout the right hemi thorax. Aeration has improved slightly. Right chest tubes are present and no pneumothorax is seen. The left lung is clear. Heart size is stable. Right IJ central venous line tip overlies the mid SVC. IMPRESSION: 1. Slightly improved aeration. 2. Right apical opacity probably represents right pleural effusion and atelectasis. Electronically  Signed   By: Dwyane Dee M.D.   On: 04/29/2016 08:08    Anti-infectives: Anti-infectives    Start     Dose/Rate Route Frequency Ordered Stop   04/27/16 1000  ceFEPIme (MAXIPIME) 2 g in dextrose 5 % 50 mL IVPB     2 g 100 mL/hr over 30 Minutes Intravenous Every 12 hours 04/27/16 0818     04/22/16 1200  piperacillin-tazobactam (ZOSYN) IVPB 3.375 g  Status:  Discontinued     3.375 g 12.5 mL/hr over 240 Minutes Intravenous Every 6 hours 04/22/16 1039 04/22/16 1042   04/22/16 1200  vancomycin (VANCOCIN) 1,500 mg in sodium  chloride 0.9 % 500 mL IVPB  Status:  Discontinued     1,500 mg 250 mL/hr over 120 Minutes Intravenous Every 12 hours 04/22/16 1133 04/28/16 0115   04/22/16 1130  piperacillin-tazobactam (ZOSYN) IVPB 3.375 g  Status:  Discontinued     3.375 g 12.5 mL/hr over 240 Minutes Intravenous Every 8 hours 04/22/16 1043 04/27/16 0754   04/02/16 1200  fluconazole (DIFLUCAN) 40 MG/ML suspension 400 mg     400 mg Per Tube  Once 04/02/16 1026 04/02/16 1442   03/31/16 1000  sulfamethoxazole-trimethoprim (BACTRIM,SEPTRA) 200-40 MG/5ML suspension 40 mL  Status:  Discontinued     40 mL Per Tube Every 12 hours 03/31/16 0927 03/31/16 0956   03/31/16 1000  sulfamethoxazole-trimethoprim (BACTRIM,SEPTRA) 200-40 MG/5ML suspension 20 mL     20 mL Per Tube Every 6 hours 03/31/16 0956 04/03/16 2359   03/26/16 1030  cefTRIAXone (ROCEPHIN) 2 g in dextrose 5 % 50 mL IVPB  Status:  Discontinued     2 g 100 mL/hr over 30 Minutes Intravenous Every 24 hours 03/26/16 1023 03/31/16 0921   03/25/16 1000  ceFEPIme (MAXIPIME) 2 g in dextrose 5 % 50 mL IVPB  Status:  Discontinued     2 g 100 mL/hr over 30 Minutes Intravenous Every 12 hours 03/25/16 0829 03/26/16 1023   03/08/16 1400  levofloxacin (LEVAQUIN) IVPB 750 mg     750 mg 100 mL/hr over 90 Minutes Intravenous Every 24 hours 03/08/16 0912 03/10/16 1501   03/06/16 1400  ceFEPIme (MAXIPIME) 2 g in dextrose 5 % 50 mL IVPB  Status:  Discontinued     2 g 100 mL/hr over 30 Minutes Intravenous Every 12 hours 03/06/16 1344 03/08/16 0912   03/01/16 1330  ceFAZolin (ANCEF) IVPB 2g/100 mL premix     2 g 200 mL/hr over 30 Minutes Intravenous To ShortStay Surgical 02/29/16 0917 03/01/16 1452   02/11/16 0600  piperacillin-tazobactam (ZOSYN) IVPB 3.375 g  Status:  Discontinued     3.375 g 100 mL/hr over 30 Minutes Intravenous 4 times per day 02/10/16 2214 02/22/16 0853   02/10/16 1730  piperacillin-tazobactam (ZOSYN) IVPB 3.375 g  Status:  Discontinued     3.375 g 12.5 mL/hr over 240  Minutes Intravenous 3 times per day 02/10/16 1727 02/10/16 2214       Assessment/Plan S/p right thoracotomy and drainage of empyema, Dr. Laneta Simmers; POD#2 - serosanguinous drainage, WBC's trending down - removal of one blake drain today, per cardiothoracic surgery. - continue wet-to-dry dressings to chest wall Fall from building Major depressive disorder - CRH when medically stable  Leukocytosis- 20.7 on 6/20; CBC ordered  FEN: goal 6 ensure daily, regular diet  DVT proph:lovenox, SCDs ID: Maxipime, Cx growing rare G- rods; Vanc held due to increased SCr/high trough; per cardiothoracic Dispo: stepdown, abx, pain control, PT/OT - enema PRN - 5 mg  PRN hydralazine. SBP <160, DBP <90   LOS: 81 days    Adam PhenixElizabeth S Uriel Horkey , [facility] 04/30/2016, 9:16 AM Pager: [phone] Mon-Fri 7:00 am-4:30 pm Sat-Sun 7:00 am-11:30 am

## 2016-05-01 ENCOUNTER — Inpatient Hospital Stay (HOSPITAL_COMMUNITY): Payer: Medicaid Other

## 2016-05-01 LAB — BASIC METABOLIC PANEL
ANION GAP: 6 (ref 5–15)
BUN: 16 mg/dL (ref 6–20)
CALCIUM: 9 mg/dL (ref 8.9–10.3)
CO2: 25 mmol/L (ref 22–32)
CREATININE: 1.84 mg/dL — AB (ref 0.44–1.00)
Chloride: 115 mmol/L — ABNORMAL HIGH (ref 101–111)
GFR, EST AFRICAN AMERICAN: 41 mL/min — AB (ref >60)
GFR, EST NON AFRICAN AMERICAN: 35 mL/min — AB (ref >60)
Glucose, Bld: 96 mg/dL (ref 65–99)
Potassium: 3.5 mmol/L (ref 3.5–5.1)
SODIUM: 146 mmol/L — AB (ref 135–145)

## 2016-05-01 LAB — GLUCOSE, CAPILLARY
GLUCOSE-CAPILLARY: 121 mg/dL — AB (ref 65–99)
GLUCOSE-CAPILLARY: 124 mg/dL — AB (ref 65–99)
GLUCOSE-CAPILLARY: 86 mg/dL (ref 65–99)
Glucose-Capillary: 120 mg/dL — ABNORMAL HIGH (ref 65–99)
Glucose-Capillary: 98 mg/dL (ref 65–99)

## 2016-05-01 NOTE — Progress Notes (Signed)
Abdominal wound 100% pink granulation tissue with re-epithelialization occuring from outward edges of wound inward towards the center.  Dressing change was completed without causing pain to patient.

## 2016-05-01 NOTE — Progress Notes (Signed)
[facility] Progress Note  5 Days Post-Op  Subjective: No complaints. Pain controlled, currently 2/10 and worse after meals. Tolerated diet. Urinating. +BMs. Bed exercises and edge of bed with PT yesterday. Back pain while working with PT. Encouraged increased PO intake and mobilization.  Objective: Vital signs in last 24 hours: Temp:  [97.4 F (36.3 C)-98.8 F (37.1 C)] 97.4 F (36.3 C) (06/24 0759) Pulse Rate:  [75-87] 81 (06/24 0759) Resp:  [16-24] 16 (06/24 0800) BP: (149-163)/(92-106) 161/105 mmHg (06/24 0759) SpO2:  [97 %-100 %] 100 % (06/24 0800) Weight:  [94 kg (207 lb 3.7 oz)] 94 kg (207 lb 3.7 oz) (06/24 0500) Last BM Date: 04/29/16  Intake/Output from previous day: 06/23 0701 - 06/24 0700 In: 2282 [P.O.:482; I.V.:1750; IV Piggyback:50] Out: 285 [Chest Tube:285] Intake/Output this shift: Total I/O In: 360 [P.O.:360] Out: -   PE: GGen: Alert, cooperative Card: RRR, no M/G/R heard Pulm: CTA, no W/R/R, TTP right chest wall; Blake drains in place on water seal. Serosanguinous output. Abd: Soft, NT, mildly distended, +BS, no HSM Ext: No erythema, edema, or tenderness  Lab Results:   Recent Labs  04/30/16 1000  WBC 15.7*  HGB 10.7*  HCT 34.0*  PLT 338   BMET  Recent Labs  04/29/16 0830 05/01/16 0038  NA 146* 146*  K 3.4* 3.5  CL 118* 115*  CO2 23 25  GLUCOSE 119* 96  BUN 13 16  CREATININE 1.76* 1.84*  CALCIUM 8.5* 9.0   PT/INR No results for input(s): LABPROT, INR in the last 72 hours. CMP     Component Value Date/Time   NA 146* 05/01/2016 0038   K 3.5 05/01/2016 0038   CL 115* 05/01/2016 0038   CO2 25 05/01/2016 0038   GLUCOSE 96 05/01/2016 0038   BUN 16 05/01/2016 0038   CREATININE 1.84* 05/01/2016 0038   CALCIUM 9.0 05/01/2016 0038   PROT 5.8* 04/27/2016 2330   ALBUMIN 1.9* 04/27/2016 2330   AST 12* 04/27/2016 2330   ALT 8* 04/27/2016 2330   ALKPHOS 79 04/27/2016 2330   BILITOT 1.0 04/27/2016 2330   GFRNONAA 35*  05/01/2016 0038   GFRAA 41* 05/01/2016 0038   Lipase  No results found for: LIPASE     Studies/Results: Dg Chest Port 1 View  05/01/2016  CLINICAL DATA:  Empyema EXAM: PORTABLE CHEST 1 VIEW COMPARISON:  Chest x-ray dated 04/30/2016. FINDINGS: Cardiomediastinal silhouette is stable. Right IJ central line is stable in position with tip at the level of the upper SVC. Right-sided chest tube in place the pleural density/thickening on the right is unchanged. No new lung findings. Multiple right-sided rib fractures again noted. IMPRESSION: 1. Stable right-sided pleural effusion/pleural thickening, unchanged. Adjacent right-sided chest tube is stable in position. 2. No new lung findings. Electronically Signed   By: Bary RichardStan  Maynard M.D.   On: 05/01/2016 10:31   Dg Chest Port 1 View  04/30/2016  CLINICAL DATA:  Empyema. EXAM: PORTABLE CHEST 1 VIEW COMPARISON:  04/29/2016. FINDINGS: Right IJ line in stable position. Right chest tubes in stable position. Cardiomegaly. Surgical staples noted over the right chest. Right pleural thickening again noted. Multiple right rib fractures are again noted. IMPRESSION: 1. Right IJ line and right chest tubes in stable position. No pneumothorax. 2. Stable right pleural effusion. Multiple displaced right rib fractures again noted. Surgical staples noted over the right chest. Electronically Signed   By: Maisie Fushomas  Register   On: 04/30/2016 08:18    Anti-infectives: Anti-infectives    Start  Dose/Rate Route Frequency Ordered Stop   04/27/16 1000  ceFEPIme (MAXIPIME) 2 g in dextrose 5 % 50 mL IVPB     2 g 100 mL/hr over 30 Minutes Intravenous Every 12 hours 04/27/16 0818     04/22/16 1200  piperacillin-tazobactam (ZOSYN) IVPB 3.375 g  Status:  Discontinued     3.375 g 12.5 mL/hr over 240 Minutes Intravenous Every 6 hours 04/22/16 1039 04/22/16 1042   04/22/16 1200  vancomycin (VANCOCIN) 1,500 mg in sodium chloride 0.9 % 500 mL IVPB  Status:  Discontinued     1,500 mg 250  mL/hr over 120 Minutes Intravenous Every 12 hours 04/22/16 1133 04/28/16 0115   04/22/16 1130  piperacillin-tazobactam (ZOSYN) IVPB 3.375 g  Status:  Discontinued     3.375 g 12.5 mL/hr over 240 Minutes Intravenous Every 8 hours 04/22/16 1043 04/27/16 0754   04/02/16 1200  fluconazole (DIFLUCAN) 40 MG/ML suspension 400 mg     400 mg Per Tube  Once 04/02/16 1026 04/02/16 1442   03/31/16 1000  sulfamethoxazole-trimethoprim (BACTRIM,SEPTRA) 200-40 MG/5ML suspension 40 mL  Status:  Discontinued     40 mL Per Tube Every 12 hours 03/31/16 0927 03/31/16 0956   03/31/16 1000  sulfamethoxazole-trimethoprim (BACTRIM,SEPTRA) 200-40 MG/5ML suspension 20 mL     20 mL Per Tube Every 6 hours 03/31/16 0956 04/03/16 2359   03/26/16 1030  cefTRIAXone (ROCEPHIN) 2 g in dextrose 5 % 50 mL IVPB  Status:  Discontinued     2 g 100 mL/hr over 30 Minutes Intravenous Every 24 hours 03/26/16 1023 03/31/16 0921   03/25/16 1000  ceFEPIme (MAXIPIME) 2 g in dextrose 5 % 50 mL IVPB  Status:  Discontinued     2 g 100 mL/hr over 30 Minutes Intravenous Every 12 hours 03/25/16 0829 03/26/16 1023   03/08/16 1400  levofloxacin (LEVAQUIN) IVPB 750 mg     750 mg 100 mL/hr over 90 Minutes Intravenous Every 24 hours 03/08/16 0912 03/10/16 1501   03/06/16 1400  ceFEPIme (MAXIPIME) 2 g in dextrose 5 % 50 mL IVPB  Status:  Discontinued     2 g 100 mL/hr over 30 Minutes Intravenous Every 12 hours 03/06/16 1344 03/08/16 0912   03/01/16 1330  ceFAZolin (ANCEF) IVPB 2g/100 mL premix     2 g 200 mL/hr over 30 Minutes Intravenous To ShortStay Surgical 02/29/16 0917 03/01/16 1452   02/11/16 0600  piperacillin-tazobactam (ZOSYN) IVPB 3.375 g  Status:  Discontinued     3.375 g 100 mL/hr over 30 Minutes Intravenous 4 times per day 02/10/16 2214 02/22/16 0853   02/10/16 1730  piperacillin-tazobactam (ZOSYN) IVPB 3.375 g  Status:  Discontinued     3.375 g 12.5 mL/hr over 240 Minutes Intravenous 3 times per day 02/10/16 1727 02/10/16 2214        Assessment/Plan S/p right thoracotomy and drainage of empyema, Dr. Laneta SimmersBartle; POD#2 - serosanguinous drainage, WBC's trending down (15.7) - removal of one blake drain, per cardiothoracic surgery. - continue wet-to-dry dressings to chest wall Fall from building Major depressive disorder - CR[facility] when medically stable  Leukocytosis- 20.7 on 6/20; CBC ordered  FEN: goal 6 ensure daily, regular diet  DVT proph:lovenox, SCDs ID: Maxipime, Cx growing rare enerobacter cloacae; Vanc held due to increased SCr/high trough; per cardiothoracic Dispo: stepdown, abx, pain control, PT/OT - 5 mg PRN hydralazine. SBP <160, DBP <90  - keep in SDU until cleared by cardiothoracic    LOS: 82 days    Adam PhenixElizabeth S Michelina Mexicano , PA-C  [facility] 05/01/2016, 11:08 AM Pager: [phone] Mon-Fri 7:00 am-4:30 pm Sat-Sun 7:00 am-11:30 am

## 2016-05-01 NOTE — Progress Notes (Addendum)
      [address]       [place] [id number]             [phone]       5 Days Post-Op Procedure(s) (LRB): Right THORACOTOMY AND DRAINAGE OF EMPYEMA (Right)  Subjective: Patient without complaints  Objective: Vital signs in last 24 hours: Temp:  [97.4 F (36.3 C)-98.8 F (37.1 C)] 97.4 F (36.3 C) (06/24 0759) Pulse Rate:  [75-87] 81 (06/24 0759) Cardiac Rhythm:  [-] Normal sinus rhythm (06/24 0745) Resp:  [16-24] 16 (06/24 0800) BP: (149-163)/(92-106) 161/105 mmHg (06/24 0759) SpO2:  [97 %-100 %] 100 % (06/24 0800) Weight:  [207 lb 3.7 oz (94 kg)] 207 lb 3.7 oz (94 kg) (06/24 0500)   Intake/Output from previous day: 06/23 0701 - 06/24 0700 In: 2282 [P.O.:482; I.V.:1750; IV Piggyback:50] Out: 285 [Chest Tube:285]   Physical Exam:  Cardiovascular: RRR Pulmonary: Clear to auscultation on left and diminished right base Wounds: Dressings are clean and dry.   Chest Tube: to suction, no air leak  Lab Results: CBC: Recent Labs  04/30/16 1000  WBC 15.7*  HGB 10.7*  HCT 34.0*  PLT 338   BMET:  Recent Labs  04/29/16 0830 05/01/16 0038  NA 146* 146*  K 3.4* 3.5  CL 118* 115*  CO2 23 25  GLUCOSE 119* 96  BUN 13 16  CREATININE 1.76* 1.84*  CALCIUM 8.5* 9.0    PT/INR: No results for input(s): LABPROT, INR in the last 72 hours. ABG:  INR: Will add last result for INR, ABG once components are confirmed Will add last 4 CBG results once components are confirmed  Assessment/Plan:  1. CV - SR 2.  Pulmonary - Chest tube with 285 cc last 24 hours. Chest tube is to suction and there is no air leak. CXR appears stable. Hope to place to water seal.Encourage incentive spirometer. 3. ID-On Maxipime. Rare Enterobacter Cloacae seen on culture. 4. Creatinine slightly increased drom 1.76 to 1.84. Per primary 5. Continue wet to dry dressing changes for right abcess  ZIMMERMAN,DONIELLE MPA-C 05/01/2016,9:31 AM I have seen and examined [name] Bell  and agree with the above assessment  and plan.  Delight OvensEdward B Corliss Lamartina MD [facility] [phone] Office [phone] 05/01/2016 10:22 AM

## 2016-05-02 ENCOUNTER — Inpatient Hospital Stay (HOSPITAL_COMMUNITY): Payer: Medicaid Other

## 2016-05-02 LAB — GLUCOSE, CAPILLARY
GLUCOSE-CAPILLARY: 107 mg/dL — AB (ref 65–99)
Glucose-Capillary: 103 mg/dL — ABNORMAL HIGH (ref 65–99)
Glucose-Capillary: 133 mg/dL — ABNORMAL HIGH (ref 65–99)

## 2016-05-02 NOTE — Progress Notes (Signed)
Trauma Service Note  Subjective: Patient is doing fine.  Still having some chronic abdominal discomfort.  Will see if further workup is necessary in the future  Objective: Vital signs in last 24 hours: Temp:  [97.7 F (36.5 C)-98.2 F (36.8 C)] 98 F (36.7 C) (06/25 0723) Pulse Rate:  [76-83] 78 (06/25 0723) Resp:  [19-29] 21 (06/25 0841) BP: (151-164)/(96-104) 164/103 mmHg (06/25 0723) SpO2:  [96 %-99 %] 96 % (06/25 0841) Weight:  [91.6 kg (201 lb 15.1 oz)] 91.6 kg (201 lb 15.1 oz) (06/25 0406) Last BM Date: 05/01/16  Intake/Output from previous day: 06/24 0701 - 06/25 0700 In: 3580 [P.O.:1680; I.V.:1800; IV Piggyback:100] Out: 80 [Chest Tube:80] Intake/Output this shift: Total I/O In: 264.2 [I.V.:214.2; IV Piggyback:50] Out: 0   General: No severe acute distress  Lungs: Clear, diminished on the right.  Chest tube site packed and improving.  Abd: Benign  Extremities: No changes  Neuro: Intact  Lab Results: CBC   Recent Labs  04/30/16 1000  WBC 15.7*  HGB 10.7*  HCT 34.0*  PLT 338   BMET  Recent Labs  05/01/16 0038  NA 146*  K 3.5  CL 115*  CO2 25  GLUCOSE 96  BUN 16  CREATININE 1.84*  CALCIUM 9.0   PT/INR No results for input(s): LABPROT, INR in the last 72 hours. ABG No results for input(s): PHART, HCO3 in the last 72 hours.  Invalid input(s): PCO2, PO2  Studies/Results: Dg Chest Port 1 View  05/02/2016  CLINICAL DATA:  Follow-up right chest tube EXAM: PORTABLE CHEST 1 VIEW COMPARISON:  6/24/ 17 FINDINGS: Cardiomediastinal silhouette is stable. Right chest tube is unchanged in position. Right upper rib fractures again noted. Stable loculated right pleural effusion. Left lung is clear. No pulmonary edema. Right IJ central line is unchanged in position. Tiny right upper loculated pneumothorax. IMPRESSION: Right chest tube is unchanged in position. Right upper rib fractures again noted. Stable loculated right pleural effusion. Left lung is clear.  No pulmonary edema. Right IJ central line is unchanged in position. Tiny right upper loculated pneumothorax. Electronically Signed   By: Natasha MeadLiviu  Pop M.D.   On: 05/02/2016 09:49   Dg Chest Port 1 View  05/01/2016  CLINICAL DATA:  Empyema EXAM: PORTABLE CHEST 1 VIEW COMPARISON:  Chest x-ray dated 04/30/2016. FINDINGS: Cardiomediastinal silhouette is stable. Right IJ central line is stable in position with tip at the level of the upper SVC. Right-sided chest tube in place the pleural density/thickening on the right is unchanged. No new lung findings. Multiple right-sided rib fractures again noted. IMPRESSION: 1. Stable right-sided pleural effusion/pleural thickening, unchanged. Adjacent right-sided chest tube is stable in position. 2. No new lung findings. Electronically Signed   By: Bary RichardStan  Maynard M.D.   On: 05/01/2016 10:31    Anti-infectives: Anti-infectives    Start     Dose/Rate Route Frequency Ordered Stop   04/27/16 1000  ceFEPIme (MAXIPIME) 2 g in dextrose 5 % 50 mL IVPB     2 g 100 mL/hr over 30 Minutes Intravenous Every 12 hours 04/27/16 0818     04/22/16 1200  piperacillin-tazobactam (ZOSYN) IVPB 3.375 g  Status:  Discontinued     3.375 g 12.5 mL/hr over 240 Minutes Intravenous Every 6 hours 04/22/16 1039 04/22/16 1042   04/22/16 1200  vancomycin (VANCOCIN) 1,500 mg in sodium chloride 0.9 % 500 mL IVPB  Status:  Discontinued     1,500 mg 250 mL/hr over 120 Minutes Intravenous Every 12 hours 04/22/16 1133  04/28/16 0115   04/22/16 1130  piperacillin-tazobactam (ZOSYN) IVPB 3.375 g  Status:  Discontinued     3.375 g 12.5 mL/hr over 240 Minutes Intravenous Every 8 hours 04/22/16 1043 04/27/16 0754   04/02/16 1200  fluconazole (DIFLUCAN) 40 MG/ML suspension 400 mg     400 mg Per Tube  Once 04/02/16 1026 04/02/16 1442   03/31/16 1000  sulfamethoxazole-trimethoprim (BACTRIM,SEPTRA) 200-40 MG/5ML suspension 40 mL  Status:  Discontinued     40 mL Per Tube Every 12 hours 03/31/16 0927 03/31/16  0956   03/31/16 1000  sulfamethoxazole-trimethoprim (BACTRIM,SEPTRA) 200-40 MG/5ML suspension 20 mL     20 mL Per Tube Every 6 hours 03/31/16 0956 04/03/16 2359   03/26/16 1030  cefTRIAXone (ROCEPHIN) 2 g in dextrose 5 % 50 mL IVPB  Status:  Discontinued     2 g 100 mL/hr over 30 Minutes Intravenous Every 24 hours 03/26/16 1023 03/31/16 0921   03/25/16 1000  ceFEPIme (MAXIPIME) 2 g in dextrose 5 % 50 mL IVPB  Status:  Discontinued     2 g 100 mL/hr over 30 Minutes Intravenous Every 12 hours 03/25/16 0829 03/26/16 1023   03/08/16 1400  levofloxacin (LEVAQUIN) IVPB 750 mg     750 mg 100 mL/hr over 90 Minutes Intravenous Every 24 hours 03/08/16 0912 03/10/16 1501   03/06/16 1400  ceFEPIme (MAXIPIME) 2 g in dextrose 5 % 50 mL IVPB  Status:  Discontinued     2 g 100 mL/hr over 30 Minutes Intravenous Every 12 hours 03/06/16 1344 03/08/16 0912   03/01/16 1330  ceFAZolin (ANCEF) IVPB 2g/100 mL premix     2 g 200 mL/hr over 30 Minutes Intravenous To [facility] 02/29/16 0917 03/01/16 1452   02/11/16 0600  piperacillin-tazobactam (ZOSYN) IVPB 3.375 g  Status:  Discontinued     3.375 g 100 mL/hr over 30 Minutes Intravenous 4 times per day 02/10/16 2214 02/22/16 0853   02/10/16 1730  piperacillin-tazobactam (ZOSYN) IVPB 3.375 g  Status:  Discontinued     3.375 g 12.5 mL/hr over 240 Minutes Intravenous 3 times per day 02/10/16 1727 02/10/16 2214      Assessment/Plan: s/p Procedure(s): Right THORACOTOMY AND DRAINAGE OF EMPYEMA Keep in SDU for now.  No other changes  LOS: 83 days   Marta LamasJames O. Gae BonWyatt, III, MD, FACS [phone] Trauma Surgeon 05/02/2016

## 2016-05-02 NOTE — Progress Notes (Addendum)
      [address]       [place] [id number]             [phone]       6 Days Post-Op Procedure(s) (LRB): Right THORACOTOMY AND DRAINAGE OF EMPYEMA (Right)  Subjective: Patient with incisional pain on right  Objective: Vital signs in last 24 hours: Temp:  [97.7 F (36.5 C)-98.2 F (36.8 C)] 98 F (36.7 C) (06/25 0723) Pulse Rate:  [75-83] 78 (06/25 0723) Cardiac Rhythm:  [-] Normal sinus rhythm (06/25 0800) Resp:  [19-29] 21 (06/25 0841) BP: (151-164)/(96-104) 164/103 mmHg (06/25 0723) SpO2:  [96 %-99 %] 96 % (06/25 0841) Weight:  [201 lb 15.1 oz (91.6 kg)] 201 lb 15.1 oz (91.6 kg) (06/25 0406)   Intake/Output from previous day: 06/24 0701 - 06/25 0700 In: 3580 [P.O.:1680; I.V.:1800; IV Piggyback:100] Out: 80 [Chest Tube:80]   Physical Exam:  Cardiovascular: RRR Pulmonary: Clear to auscultation on left and diminished right base Wounds: Dressings are clean and dry.   Chest Tube: to suction, no air leak  Lab Results: CBC:  Recent Labs  04/30/16 1000  WBC 15.7*  HGB 10.7*  HCT 34.0*  PLT 338   BMET:   Recent Labs  05/01/16 0038  NA 146*  K 3.5  CL 115*  CO2 25  GLUCOSE 96  BUN 16  CREATININE 1.84*  CALCIUM 9.0    PT/INR: No results for input(s): LABPROT, INR in the last 72 hours. ABG:  INR: Will add last result for INR, ABG once components are confirmed Will add last 4 CBG results once components are confirmed  Assessment/Plan:  1. CV - SR 2.  Pulmonary - Chest tube with 80 cc last 24 hours. Chest tube is to suction and there is no air leak. CXR appears stable.  Place to water seal.Encourage incentive spirometer. 3. ID-On Maxipime. Rare Enterobacter Cloacae seen on culture. 4. Creatinine slightly increased drom 1.76 to 1.84. Per primary 5. Continue wet to dry dressing changes for right abcess  ZIMMERMAN,DONIELLE MPA-C 05/02/2016,9:27 AM Chest to water seal, poss remove in am I have seen and examined [name] Bell and  agree with the above assessment  and plan.  Delight OvensEdward B Birdie Fetty MD [facility] [phone] Office [phone] 05/02/2016 10:47 AM

## 2016-05-03 ENCOUNTER — Inpatient Hospital Stay (HOSPITAL_COMMUNITY): Payer: Medicaid Other

## 2016-05-03 LAB — BASIC METABOLIC PANEL
ANION GAP: 5 (ref 5–15)
BUN: 16 mg/dL (ref 6–20)
CALCIUM: 9.1 mg/dL (ref 8.9–10.3)
CO2: 28 mmol/L (ref 22–32)
CREATININE: 1.46 mg/dL — AB (ref 0.44–1.00)
Chloride: 106 mmol/L (ref 101–111)
GFR, EST AFRICAN AMERICAN: 54 mL/min — AB (ref >60)
GFR, EST NON AFRICAN AMERICAN: 47 mL/min — AB (ref >60)
Glucose, Bld: 107 mg/dL — ABNORMAL HIGH (ref 65–99)
Potassium: 3.9 mmol/L (ref 3.5–5.1)
SODIUM: 139 mmol/L (ref 135–145)

## 2016-05-03 LAB — GLUCOSE, CAPILLARY
GLUCOSE-CAPILLARY: 98 mg/dL (ref 65–99)
GLUCOSE-CAPILLARY: 103 mg/dL — AB (ref 65–99)
Glucose-Capillary: 119 mg/dL — ABNORMAL HIGH (ref 65–99)
Glucose-Capillary: 142 mg/dL — ABNORMAL HIGH (ref 65–99)

## 2016-05-03 LAB — AEROBIC/ANAEROBIC CULTURE (SURGICAL/DEEP WOUND)

## 2016-05-03 LAB — AEROBIC/ANAEROBIC CULTURE W GRAM STAIN (SURGICAL/DEEP WOUND)

## 2016-05-03 MED ORDER — OXYCODONE HCL 5 MG PO TABS
10.0000 mg | ORAL_TABLET | ORAL | Status: DC | PRN
  Administered 2016-05-03 – 2016-05-09 (×8): 10 mg via ORAL
  Administered 2016-05-10: 5 mg via ORAL
  Administered 2016-05-10 – 2016-05-12 (×7): 10 mg via ORAL
  Administered 2016-05-13: 20 mg via ORAL
  Administered 2016-05-13: 15 mg via ORAL
  Administered 2016-05-13 – 2016-05-15 (×6): 10 mg via ORAL
  Administered 2016-05-16 – 2016-05-17 (×8): 15 mg via ORAL
  Administered 2016-05-18 – 2016-05-19 (×5): 20 mg via ORAL
  Administered 2016-05-20 (×2): 10 mg via ORAL
  Filled 2016-05-03: qty 2
  Filled 2016-05-03 (×2): qty 4
  Filled 2016-05-03 (×2): qty 2
  Filled 2016-05-03: qty 4
  Filled 2016-05-03 (×4): qty 2
  Filled 2016-05-03: qty 3
  Filled 2016-05-03: qty 2
  Filled 2016-05-03: qty 4
  Filled 2016-05-03: qty 2
  Filled 2016-05-03: qty 4
  Filled 2016-05-03 (×2): qty 2
  Filled 2016-05-03: qty 3
  Filled 2016-05-03 (×6): qty 2
  Filled 2016-05-03 (×2): qty 4
  Filled 2016-05-03 (×3): qty 2
  Filled 2016-05-03: qty 3
  Filled 2016-05-03: qty 2
  Filled 2016-05-03: qty 4
  Filled 2016-05-03: qty 2
  Filled 2016-05-03 (×3): qty 3
  Filled 2016-05-03: qty 2
  Filled 2016-05-03: qty 3
  Filled 2016-05-03: qty 4
  Filled 2016-05-03 (×2): qty 3
  Filled 2016-05-03 (×2): qty 2

## 2016-05-03 NOTE — Progress Notes (Signed)
Physical Therapy Treatment Patient Details Name: [name] Bell MRN: [id number] DOB: 02-Dec-1980 Today's Date: 05/03/2016    History of Present Illness [name] Bell is a 35 y.o. female.She is obese. PMH unknown.Pt attempted suicide: taped DNR note to chest, drank Drano, then jumped off 5 story building ([address]).S/p 02/09/16 gel foam embolization of bil internal iliac arteries.S/p 02/09/16 ex lap with cholecystectomy, resection SB and mesenteric repair, liver lac repair, application or wound vac.S/p 02/09/16 closed reduction of pelvic ring, external pelvic fixation.Also has right metacarpal fracture in splint. Pt on and off intubation via trach/ and on/off trac collar.  Pelvic x- fixator pins removed at bedside on 03/23/16.  Underwent thoracotomy 6/19 due to empyema/abcess with 2 chest tubes, one d/c today 04/30/16 and pt no longer has trach.      PT Comments    Pt was able to get OOB to chair with RW, three person assist used, but likely only two needed for safety.  Pt did well with "it is time to do this" mentality vs asking her what she wanted to do today.  Agreeable to try gait outside of room next session.  Would do better with therapist continuity.  Goals re-set as some of them are met.  Follow Up Recommendations  Other (comment) (inpatient psych hospital)     Equipment Recommendations  Wheelchair (measurements PT);Wheelchair cushion (measurements PT);Hospital bed;Other (comment);3in1 (PT);Rolling walker with 5" wheels    Recommendations for Other Services   NA     Precautions / Restrictions Precautions Precautions: Fall;Other (comment) Precaution Comments: chest tube R Required Braces or Orthoses: Other Brace/Splint Other Brace/Splint: L wrist splint, bilateral PRAFO's Restrictions Weight Bearing Restrictions: Yes RUE Weight Bearing: Weight bearing as tolerated LUE Weight Bearing: Weight bearing as tolerated RLE Weight Bearing: Weight bearing as tolerated LLE Weight  Bearing: Weight bearing as tolerated    Mobility  Bed Mobility Overal bed mobility: Needs Assistance Bed Mobility: Supine to Sit     Supine to sit: Min assist;HOB elevated     General bed mobility comments: Pt needed min assist to help support trunk to get to sitting.  Heavy reliance on bed rail for leverage at trunk.  Pt unable to power up on her own (attempted once and failed).    Transfers Overall transfer level: Needs assistance Equipment used: Rolling walker (2 wheeled) Transfers: Sit to/from Stand Sit to Stand: +2 safety/equipment;Min assist         General transfer comment: Three people used for mobility.  Verbal cues for safe hand placement.  Two people at trunk to power up over weak legs, third used but not necessarily needed for line management.    Ambulation/Gait Ambulation/Gait assistance: +2 safety/equipment;Min assist Ambulation Distance (Feet): 3 Feet Assistive device: Rolling walker (2 wheeled) Gait Pattern/deviations: Step-to pattern;Shuffle     General Gait Details: Pt walked 3-4 steps away from bed to recliner chair with RW, two person assist at trunk for safety and balance.  Pt showed signs of weakness in bil LEs, third person used for line management.            Balance Overall balance assessment: Needs assistance Sitting-balance support: Feet supported;No upper extremity supported;Bilateral upper extremity supported;Single extremity supported Sitting balance-[name]: Good Sitting balance - Comments: Pt reported lightheadedness in sitting EOB.  BP taken and was stable.     Standing balance support: Bilateral upper extremity supported Standing balance-[name]: Poor  Cognition Arousal/Alertness: Awake/alert Behavior During Therapy: Flat affect Overall Cognitive Status: Within Functional Limits for tasks assessed                             Pertinent Vitals/Pain Pain Assessment: 0-10 Pain Score:  10-Worst pain ever Pain Location: abdomin (at rest), low back EOB Pain Descriptors / Indicators: Grimacing;Guarding Pain Intervention(s): Limited activity within patient's tolerance;Monitored during session;Repositioned;PCA encouraged           PT Goals (current goals can now be found in the care plan section) Acute Rehab PT Goals Patient Stated Goal: none stated PT Goal Formulation: With patient Time For Goal Achievement: 05/17/16 Potential to Achieve Goals: Good Progress towards PT goals: Goals met and updated - see care plan (updated goals to reflect progess)    Frequency  Min 5X/week    PT Plan Current plan remains appropriate       End of Session Equipment Utilized During Treatment: Gait belt Activity Tolerance: Patient limited by fatigue;Patient limited by pain Patient left: in chair;with call bell/phone within reach;with nursing/sitter in room     Time: 1200-1231 PT Time Calculation (min) (ACUTE ONLY): 31 min  Charges:  $Therapeutic Activity: 23-37 mins                      Nguyen Butler B. Holden, [place], DPT [phone]   05/03/2016, 1:56 PM

## 2016-05-03 NOTE — Progress Notes (Signed)
Patient ID: [name] Bell, female   DOB: 1981-03-01, 35 y.o.   MRN: [id number]   LOS: 84 days   Subjective: Sleeping   Objective: Vital signs in last 24 hours: Temp:  [97.9 F (36.6 C)-98 F (36.7 C)] 98 F (36.7 C) (06/26 0316) Pulse Rate:  [76-81] 76 (06/26 0316) Resp:  [15-23] 18 (06/26 0400) BP: (151-164)/(95-111) 151/95 mmHg (06/26 0316) SpO2:  [95 %-99 %] 97 % (06/26 0400) Weight:  [89.5 kg (197 lb 5 oz)] 89.5 kg (197 lb 5 oz) (06/26 0500) Last BM Date: 05/01/16   Laboratory  BMET  Recent Labs  05/01/16 0038 05/03/16 0423  NA 146* 139  K 3.5 3.9  CL 115* 106  CO2 25 28  GLUCOSE 96 107*  BUN 16 16  CREATININE 1.84* 1.46*  CALCIUM 9.0 9.1    Physical Exam General appearance: no distress Resp: clear to auscultation bilaterally Cardio: regular rate and rhythm GI: Soft, +BS   Assessment/Plan: Jump from hotel/multiple toxic ingestions -Continue on Zoloft, sitter for safety.  S/P ex lap, hepatorraphy, SBR, cholecystectomy, preperitoneal pelvic packing [name] 4/3  Ex lap for acidosis/bleeding 4/4 Thompson Ex lap for bleeding 4/5 [name] S/P ex fix pelvis [name] 4/3 S/P ex lap 4/7 Thompson S/P ex lap 4/10 Thompson S/P sacral screws 4/10 [name] S/P application ABRA, open gastrostomy tube 4/12 [name]  S/P abd closure, trach 4/24 [name] Mult B rib FX/R HPTX, B pulm contusions - IR placed Chest tube drain (03/27/16) for pleural effusion, TCTS following.Pigtail drain placed 5/20 and removed 04/10/16 Multiple toxic ingestions Mult L wrist FXs - splint until f/u with Dr. Melvyn Novasrtmann as OP, can WBAT Pelvic FX - S/P ex fix and angioembolization, S/P sacral screw by Dr. Carola FrostHandy. WBAT BLE. ABL anemia Right empyema s/p thoracotomy -- Per TTS FEN - D/C PCA, orals for pain. D/C central line. VTE - SCD's.  Dispo - Ok to transfer once ok with TTS    Freeman CaldronMichael J. Fintan Grater, PA-C Pager: [phone] General Trauma PA Pager: [phone]  05/03/2016

## 2016-05-03 NOTE — Progress Notes (Signed)
Pharmacy Antibiotic Note  [name] Bell is a 35 y.o. female admitted on 02/09/2016 s/p attempted suicide from jumping off a hotel.  Pharmacy has been consulted to manage Cefepime for empyema, now s/p thoracotomy.  Patient's renal function is improving.  Plan: - Continue Cefepime 2g IV Q12H - Monitor renal fxn, abx LOT  Height: 5\' 11"  (180.3 cm) Weight: 197 lb 5 oz (89.5 kg) IBW/kg (Calculated) : 70.8  Temp (24hrs), Avg:98 F (36.7 C), Min:97.9 F (36.6 C), Max:98.1 F (36.7 C)   Recent Labs Lab 04/27/16 0440 04/27/16 2330 04/28/16 04/29/16 0830 04/30/16 1000 05/01/16 0038 05/03/16 0423  WBC 23.1* 20.7*  --   --  15.7*  --   --   CREATININE 1.49* 1.60*  --  1.76*  --  1.84* 1.46*  VANCOTROUGH  --   --  60*  --   --   --   --     Estimated Creatinine Clearance: 68.4 mL/min (by C-G formula based on Cr of 1.46).    No Known Allergies  Antimicrobials this admission: Cefepime 4/29 >> 5/1; 5/18 x2 doses, 6/20 >> Levofloxacin 5/1 >> 5/3 Ceftriaxone 5/19 >> 5/23 Bactrim 5/24 >> 5/27 Fluconazole 5/26 x1 Vanc 6/15>>6/21 Zosyn 6/15 >>6/20  Dose adjustments this admission: 6/20 VT: 60 mcg/mL > held  Microbiology results: 6/19 R empyema - Rare Enterobacter Cloacae (S-Cefepmie, Cipro, gent, Primaxin, Septra) 6/15 chest wound - negative 5/20 CT drainage - Enterobacter (R roceph; sens bactrim, FQN) 5/17 TA - Enterobacter (R only to cefazolin) 5/6, 5/10, 5/16 TA - normal flora 4/26 TA - Enterobacter cloacae (S- cefepime, cipro, aminoglycosides, Bactrim)   Kaleigh Spiegelman D. Laney Potashang, PharmD, BCPS Pager:  [phone] 05/03/2016, 11:26 AM

## 2016-05-03 NOTE — Progress Notes (Signed)
[address]       [place] [id number]             [phone]      7 Days Post-Op Procedure(s) (LRB): Right THORACOTOMY AND DRAINAGE OF EMPYEMA (Right) Subjective: C/o abdominal discomfort but actually in pretty good spirits  Objective: Vital signs in last 24 hours: Temp:  [97.9 F (36.6 C)-98.1 F (36.7 C)] 98.1 F (36.7 C) (06/26 0900) Pulse Rate:  [76-91] 91 (06/26 1208) Cardiac Rhythm:  [-] Normal sinus rhythm (06/26 1208) Resp:  [16-91] 91 (06/26 1208) BP: (128-162)/(88-111) 128/88 mmHg (06/26 1223) SpO2:  [95 %-99 %] 97 % (06/26 1208) Weight:  [197 lb 5 oz (89.5 kg)] 197 lb 5 oz (89.5 kg) (06/26 0500)  Hemodynamic parameters for last 24 hours:    Intake/Output from previous day: 06/25 0701 - 06/26 0700 In: 2230 [P.O.:960; I.V.:1170; IV Piggyback:100] Out: 70 [Chest Tube:70] Intake/Output this shift: Total I/O In: 410 [P.O.:360; IV Piggyback:50] Out: 0   General appearance: alert, cooperative and no distress Heart: regular rate and rhythm Lungs: dim in right base Abdomen: mild diffuse ttp Extremities: no edema Wound: dressings CDI  Lab Results: No results for input(s): WBC, HGB, HCT, PLT in the last 72 hours. BMET:  Recent Labs  05/01/16 0038 05/03/16 0423  NA 146* 139  K 3.5 3.9  CL 115* 106  CO2 25 28  GLUCOSE 96 107*  BUN 16 16  CREATININE 1.84* 1.46*  CALCIUM 9.0 9.1    PT/INR: No results for input(s): LABPROT, INR in the last 72 hours. ABG    Component Value Date/Time   PHART 7.508* 02/28/2016 0642   HCO3 25.5* 02/28/2016 0642   TCO2 26.5 02/28/2016 0642   ACIDBASEDEF 2.0 02/13/2016 0411   O2SAT 99.2 02/28/2016 0642   CBG (last 3)   Recent Labs  05/02/16 2316 05/03/16 0516 05/03/16 1149  GLUCAP 133* 98 119*    Meds Scheduled Meds: . bacitracin   Topical BID  . bisacodyl  10 mg Oral Daily  . ceFEPime (MAXIPIME) IV  2 g Intravenous Q12H  . clonazePAM  0.5 mg Oral QHS  . dextromethorphan-guaiFENesin   1 tablet Oral BID  . enoxaparin (LOVENOX) injection  30 mg Subcutaneous Q12H  . feeding supplement (ENSURE ENLIVE)  237 mL Oral 6 X Daily  . ferrous sulfate  325 mg Oral TID WC  . pantoprazole  40 mg Oral Daily  . polyethylene glycol  17 g Oral Daily  . senna-docusate  1 tablet Oral QHS  . sertraline  100 mg Oral Daily   Continuous Infusions: . dextrose 5 % and 0.45 % NaCl with KCl 20 mEq/L 50 mL/hr at 05/03/16 0500   PRN Meds:.docusate sodium, hydrALAZINE, ipratropium-albuterol, LORazepam, oxyCODONE, simethicone  Xrays Dg Chest Port 1 View  05/03/2016  CLINICAL DATA:  Pneumothorax EXAM: PORTABLE CHEST 1 VIEW COMPARISON:  05/02/2016 FINDINGS: Right central line and right chest tube remain in place, unchanged. Stable loculated right pleural effusion and right rib fractures. No definite pneumothorax. Right base atelectasis. No confluent opacity on the left. IMPRESSION: No visible pneumothorax currently. Stable loculated right pleural effusion with right rib fractures and right base atelectasis. Electronically Signed   By: Charlett NoseKevin  Dover M.D.   On: 05/03/2016 07:41   Dg Chest Port 1 View  05/02/2016  CLINICAL DATA:  Follow-up right chest tube EXAM: PORTABLE CHEST 1 VIEW COMPARISON:  6/24/ 17 FINDINGS: Cardiomediastinal silhouette is stable. Right chest tube is unchanged  in position. Right upper rib fractures again noted. Stable loculated right pleural effusion. Left lung is clear. No pulmonary edema. Right IJ central line is unchanged in position. Tiny right upper loculated pneumothorax. IMPRESSION: Right chest tube is unchanged in position. Right upper rib fractures again noted. Stable loculated right pleural effusion. Left lung is clear. No pulmonary edema. Right IJ central line is unchanged in position. Tiny right upper loculated pneumothorax. Electronically Signed   By: Natasha MeadLiviu  Pop M.D.   On: 05/02/2016 09:49    Assessment/Plan: S/P Procedure(s) (LRB): Right THORACOTOMY AND DRAINAGE OF EMPYEMA  (Right)  1 70 cc CT drainage yesterday, no air leak, poss d/c soon 2 conts maxipime 3 conts dressing changes 4 creat decreased today to 1.46  LOS: 84 days    GOLD,WAYNE E 05/03/2016

## 2016-05-04 LAB — GLUCOSE, CAPILLARY
Glucose-Capillary: 120 mg/dL — ABNORMAL HIGH (ref 65–99)
Glucose-Capillary: 145 mg/dL — ABNORMAL HIGH (ref 65–99)

## 2016-05-04 NOTE — Clinical Social Work Psych Note (Signed)
Psych CSW received medical update from RN and completed chart review.  Psych CSW passed along progress to Wellsite geologistMedical Director and CSW Director who are assisting with disposition/discharge planning.  Once patient has transferred to the medical floor, psychiatry will be re-consulted for an updated evaluation and determination of needed placement.  Vickii PennaGina Arvo Ealy, LCSW [phone]  [address] [facility] Line Licensed Clinical Social Worker

## 2016-05-04 NOTE — Progress Notes (Signed)
Occupational Therapy Treatment/Re-evaluation Patient Details Name: [name] Bell MRN: [id number] DOB: [birth year] Today's Date: 05/04/2016    History of present illness [name] Bell is a 35 y.o. female.She is obese. PMH unknown.Pt attempted suicide: taped DNR note to chest, drank Drano, then jumped off 5 story building ([address]).S/p 02/09/16 gel foam embolization of bil internal iliac arteries.S/p 02/09/16 ex lap with cholecystectomy, resection SB and mesenteric repair, liver lac repair, application or wound vac.S/p 02/09/16 closed reduction of pelvic ring, external pelvic fixation.Also has right metacarpal fracture in splint. Pt on and off intubation via trach/ and on/off trac collar.  Pelvic x- fixator pins removed at bedside on 03/23/16.  Underwent thoracotomy 6/19 due to empyema/abcess with 2 chest tubes, one d/c 04/30/16 and pt no longer has trach.     OT comments  Pt has made significant progress since initial evaluation. Required Min  A with bed mobility and Min A +2 with transfers and was able to ambulate @ 8 feet with RW. Discussed pt's goals and her interests/what she enjoyed doing. Discussed her ability to participate more with ADL and how these goals could be achieved. Pt stated she enjoyed dancing and coloring. Will plan to incorporate these meaningful activities into therapy sessions to progress pt to meet goals and maximize her independence. Would like to try to pt off floor to gift shop and outside when cleared by MD. Nsg stated it was OK to bring  Crayons and coloring book into pt's room. Goals updated. Will continue to follow.   Follow Up Recommendations  CIR;Supervision/Assistance - 24 hour    Equipment Recommendations  3 in 1 bedside comode    Recommendations for Other Services Other (comment)    Precautions / Restrictions Precautions Precautions: Fall Precaution Comments: chest tube R Other Brace/Splint: L wrist splint, bilateral PRAFO's Restrictions Weight  Bearing Restrictions: Yes RUE Weight Bearing: Weight bearing as tolerated LUE Weight Bearing: Weight bearing as tolerated RLE Weight Bearing: Weight bearing as tolerated LLE Weight Bearing: Weight bearing as tolerated       Mobility Bed Mobility Overal bed mobility: Needs Assistance Bed Mobility: Supine to Sit Rolling: Min assist Sidelying to sit: Min assist       General bed mobility comments: Pt using rails to assist to pull and assist to transition trunk to upright sitting  Transfers Overall transfer level: Needs assistance Equipment used: Rolling walker (2 wheeled) Transfers: Sit to/from Stand Sit to Stand: +2 safety/equipment;Min assist Stand pivot transfers: Min assist;+2 physical assistance            Balance     Sitting balance-[name]: Good       Standing balance-[name]: Poor                     ADL Overall ADL's : Needs assistance/impaired Eating/Feeding: Modified independent   Grooming: Set up;Cueing for UE precautions   Upper Body Bathing: Set up;Sitting   Lower Body Bathing: Moderate assistance;Bed level   Upper Body Dressing : Minimal assistance;Sitting   Lower Body Dressing: Moderate assistance;Sit to/from stand;Bed level (able to finish pulling socks up at bed level using "circle s)       Toileting- Clothing Manipulation and Hygiene: Maximal assistance Toileting - Clothing Manipulation Details (indicate cue type and reason): Pt currently using diapers.      Functional mobility during ADLs: Minimal assistance;+2 for physical assistance;Rolling walker General ADL Comments: discussed goals of icreasing independence with self care aty bed level  Vision                     Perception     Praxis      Cognition   Behavior During Therapy: Flat affect Overall Cognitive Status: Within Functional Limits for tasks assessed                       Extremity/Trunk Assessment   BUE generalized  weakness            Exercises  encouraged general AROM x 4 extremities   Shoulder Instructions       General Comments      Pertinent Vitals/ Pain       Pain Assessment: 0-10 Pain Score: 5  Pain Location: abdomen Pain Descriptors / Indicators: Aching;Grimacing;Discomfort;Squeezing Pain Intervention(s): Limited activity within patient's tolerance  Home Living                                          Prior Functioning/Environment              Frequency Min 3X/week     Progress Toward Goals  OT Goals(current goals can now be found in the care plan section)  Progress towards OT goals: Goals met and updated - see care plan  Acute Rehab OT Goals Patient Stated Goal: to do more for myself OT Goal Formulation: With patient Time For Goal Achievement: 05/18/16 Potential to Achieve Goals: Good  Plan Discharge plan remains appropriate    Co-evaluation                 End of Session Equipment Utilized During Treatment: Gait belt;Rolling walker;Other (comment) (chest tube)   Activity Tolerance Patient tolerated treatment well   Patient Left in chair;with call bell/phone within reach;with nursing/sitter in room   Nurse Communication Mobility status        Time: 0321-2248 OT Time Calculation (min): 21 min  Charges: OT General Charges $OT Visit: 1 Procedure OT Evaluation $OT Re-eval: 1 Procedure  Loyalty Arentz,HILLARY 05/04/2016, 4:47 PM   Maurie Boettcher, OTR/L  [phone] 05/04/2016

## 2016-05-04 NOTE — Progress Notes (Signed)
[address]       [place] [id number]             [phone]      8 Days Post-Op Procedure(s) (LRB): Right THORACOTOMY AND DRAINAGE OF EMPYEMA (Right) Subjective: Feels better, less abdominal discomfort Not much chest tube drainage  Objective: Vital signs in last 24 hours: Temp:  [98 F (36.7 C)-99.3 F (37.4 C)] 98.2 F (36.8 C) (06/27 0710) Pulse Rate:  [81-91] 82 (06/27 0710) Cardiac Rhythm:  [-] Normal sinus rhythm (06/27 0240) Resp:  [16-91] 22 (06/27 0710) BP: (110-155)/(75-105) 155/105 mmHg (06/27 0710) SpO2:  [96 %-98 %] 97 % (06/27 0710) Weight:  [192 lb 3.9 oz (87.2 kg)] 192 lb 3.9 oz (87.2 kg) (06/27 0526)  Hemodynamic parameters for last 24 hours:    Intake/Output from previous day: 06/26 0701 - 06/27 0700 In: 1750 [P.O.:600; I.V.:1050; IV Piggyback:100] Out: 0  Intake/Output this shift: Total I/O In: 200 [I.V.:200] Out: -   General appearance: alert, cooperative and no distress Heart: regular rate and rhythm Lungs: clear anteriorly , dim in right base Abdomen: minimal tenderness Extremities: no edema Wound: dressing changed at 4 am, conts to do well  Lab Results: No results for input(s): WBC, HGB, HCT, PLT in the last 72 hours. BMET:  Recent Labs  05/03/16 0423  NA 139  K 3.9  CL 106  CO2 28  GLUCOSE 107*  BUN 16  CREATININE 1.46*  CALCIUM 9.1    PT/INR: No results for input(s): LABPROT, INR in the last 72 hours. ABG    Component Value Date/Time   PHART 7.508* 02/28/2016 0642   HCO3 25.5* 02/28/2016 0642   TCO2 26.5 02/28/2016 0642   ACIDBASEDEF 2.0 02/13/2016 0411   O2SAT 99.2 02/28/2016 0642   CBG (last 3)   Recent Labs  05/03/16 1901 05/03/16 2314 05/04/16 0507  GLUCAP 103* 142* 120*    Meds Scheduled Meds: . bacitracin   Topical BID  . bisacodyl  10 mg Oral Daily  . ceFEPime (MAXIPIME) IV  2 g Intravenous Q12H  . clonazePAM  0.5 mg Oral QHS  . dextromethorphan-guaiFENesin  1 tablet Oral BID   . enoxaparin (LOVENOX) injection  30 mg Subcutaneous Q12H  . feeding supplement (ENSURE ENLIVE)  237 mL Oral 6 X Daily  . ferrous sulfate  325 mg Oral TID WC  . pantoprazole  40 mg Oral Daily  . polyethylene glycol  17 g Oral Daily  . senna-docusate  1 tablet Oral QHS  . sertraline  100 mg Oral Daily   Continuous Infusions: . dextrose 5 % and 0.45 % NaCl with KCl 20 mEq/L 50 mL/hr at 05/04/16 0800   PRN Meds:.docusate sodium, hydrALAZINE, ipratropium-albuterol, LORazepam, oxyCODONE, simethicone  Xrays Dg Chest Port 1 View  05/03/2016  CLINICAL DATA:  Pneumothorax EXAM: PORTABLE CHEST 1 VIEW COMPARISON:  05/02/2016 FINDINGS: Right central line and right chest tube remain in place, unchanged. Stable loculated right pleural effusion and right rib fractures. No definite pneumothorax. Right base atelectasis. No confluent opacity on the left. IMPRESSION: No visible pneumothorax currently. Stable loculated right pleural effusion with right rib fractures and right base atelectasis. Electronically Signed   By: Charlett NoseKevin  Dover M.D.   On: 05/03/2016 07:41    Assessment/Plan: S/P Procedure(s) (LRB): Right THORACOTOMY AND DRAINAGE OF EMPYEMA (Right)  1 steady progress 2 poss d/c chest tube soon 3 cont current abx   LOS: 85 days    Kaian Fahs E 05/04/2016

## 2016-05-04 NOTE — Progress Notes (Signed)
Patient ID: [name] Bell, female   DOB: October 13, 1981, 35 y.o.   MRN: [id number]   LOS: 85 days   Subjective: Having some abd pain this morning but otherwise NSC   Objective: Vital signs in last 24 hours: Temp:  [98 F (36.7 C)-99.3 F (37.4 C)] 98.2 F (36.8 C) (06/27 0710) Pulse Rate:  [81-91] 82 (06/27 0710) Resp:  [16-91] 22 (06/27 0710) BP: (110-155)/(75-105) 155/105 mmHg (06/27 0710) SpO2:  [96 %-98 %] 97 % (06/27 0710) Weight:  [87.2 kg (192 lb 3.9 oz)] 87.2 kg (192 lb 3.9 oz) (06/27 0526) Last BM Date: 05/01/16   Laboratory  CBG (last 3)   Recent Labs  05/03/16 1901 05/03/16 2314 05/04/16 0507  GLUCAP 103* 142* 120*    Physical Exam General appearance: alert and no distress Resp: clear to auscultation bilaterally Cardio: regular rate and rhythm GI: Soft, +BS, incision C/D/I   Assessment/Plan: Jump from hotel/multiple toxic ingestions -Continue on Zoloft, sitter for safety.  S/P ex lap, hepatorraphy, SBR, cholecystectomy, preperitoneal pelvic packing [name] 4/3  Ex lap for acidosis/bleeding 4/4 Thompson Ex lap for bleeding 4/5 [name] S/P ex fix pelvis [name] 4/3 S/P ex lap 4/7 Thompson S/P ex lap 4/10 Thompson S/P sacral screws 4/10 [name] S/P application ABRA, open gastrostomy tube 4/12 [name]  S/P abd closure, trach 4/24 [name] Mult B rib FX/R HPTX, B pulm contusions - IR placed Chest tube drain (03/27/16) for pleural effusion, TCTS following.Pigtail drain placed 5/20 and removed 04/10/16 Multiple toxic ingestions Mult L wrist FXs - splint until f/u with Dr. Melvyn Novasrtmann as OP, can WBAT Pelvic FX - S/P ex fix and angioembolization, S/P sacral screw by Dr. Carola FrostHandy. WBAT BLE. ABL anemia Right empyema s/p thoracotomy -- Per TTS, on Maxipime FEN - No issues VTE - SCD's, Lovenox Dispo - Ok to transfer once ok with TTS    Freeman CaldronMichael J. Tyran Huser, PA-C Pager: [phone] General Trauma PA Pager: [phone]  05/04/2016

## 2016-05-04 NOTE — Progress Notes (Addendum)
Pt refuses woundcare, she states she is "too tired", reviewed s/s of infection and importance of woundcare as prescribed by MD.  Pt states she understands.  Marisue Ivanobyn Jacquis Paxton RN

## 2016-05-04 NOTE — Progress Notes (Addendum)
Nutrition Follow-up  DOCUMENTATION CODES:   Severe malnutrition in context of acute illness/injury  INTERVENTION:    Continue Regular diet with Ensure Enlive PO 6 times per day.  Encourage PO's.  NUTRITION DIAGNOSIS:   Increased nutrient needs related to  (trauma) as evidenced by estimated needs.  Ongoing  GOAL:   Patient will meet greater than or equal to 90% of their needs  Unmet  MONITOR:   PO intake, Supplement acceptance, Labs, Weight trends, Skin, I & O's  ASSESSMENT:   Pt from [place] with no past medical hx admitted after suicide attempt by ingestion of Drano, 2 bottles of acetaminophen, nyquil, and possibly 6 alprazolam pills and jumping from 5th floor balcony of hotel. 4/3 pt is s/p exp lap, hepatorraphy, SBR, cholecystectomy, preperitoneal pelvic packing, ex fix pelvis, R HPTX, Bil pulmonary contusions. Plan for repeat OR 4/5 for another possible SBR. Abdomen remains open.   S/P right thoracotomy and drainage of empyema on 6/19. Chest tube in place.  Patient sleeping during RD visit. RN reports that patient has been eating hardly anything and has not been drinking the Ensure supplements. TF was discontinued on June 5th. Patient has been eating minimally since that time.  Patient has lost 89 lbs since admission (32% of admission weight). She has been eating very poorly, suspect intake has been  < 75% of estimated energy requirement for > 1 month. Intake has been < 50% of estimated energy requirement for > 5 days. Patient with severe PCM.  No longer obese, BMI is 26.8.  Diet Order:  Diet regular Room service appropriate?: Yes; Fluid consistency:: Thin  Skin:  Reviewed, no issues (incisions)  Last BM:  6/24  Height:   Ht Readings from Last 1 Encounters:  04/26/16 5\' 11"  (1.803 m)    Weight:   Wt Readings from Last 1 Encounters:  05/04/16 192 lb 3.9 oz (87.2 kg)    Ideal Body Weight:  70.4 kg  BMI:  Body mass index is 26.82 kg/(m^2).  Estimated  Nutritional Needs:   Kcal:  2000-2200  Protein:  105-125 gm  Fluid:  2-2.2 L  EDUCATION NEEDS:   No education needs identified at this time  Joaquin CourtsKimberly Harris, RD, LDN, CNSC Pager [phone] After Hours Pager [phone]

## 2016-05-04 NOTE — Progress Notes (Signed)
Physical Therapy Treatment Patient Details Name: [name] Bell MRN: [id number] DOB: Jan 10, 1981 Today's Date: 05/04/2016    History of Present Illness [name] Bell is a 35 y.o. female.She is obese. PMH unknown.Pt attempted suicide: taped DNR note to chest, drank Drano, then jumped off 5 story building ([address]).S/p 02/09/16 gel foam embolization of bil internal iliac arteries.S/p 02/09/16 ex lap with cholecystectomy, resection SB and mesenteric repair, liver lac repair, application or wound vac.S/p 02/09/16 closed reduction of pelvic ring, external pelvic fixation.Also has right metacarpal fracture in splint. Pt on and off intubation via trach/ and on/off trac collar.  Pelvic x- fixator pins removed at bedside on 03/23/16.  Underwent thoracotomy 6/19 due to empyema/abcess with 2 chest tubes, one d/c 04/30/16 and pt no longer has trach.      PT Comments    Patient seemed more willing today to participate in therapy; however, she was limited due to pain in her R shoulder, lower back, and R LE. She was unable to ambulate and required additional assistance with bed mobilities due to pain. Visual inspection and palpation on lower back showed an area of inflammation to the right of her lumbar spine.    Follow Up Recommendations  Other (comment) (inpatient psych hospital)     Equipment Recommendations  Wheelchair (measurements PT);Wheelchair cushion (measurements PT);Hospital bed;Other (comment);3in1 (PT);Rolling walker with 5" wheels       Precautions / Restrictions Precautions Precautions: Fall;Other (comment) (Chest tube on R) Precaution Comments: chest tube R Required Braces or Orthoses: Other Brace/Splint Other Brace/Splint: L wrist splint, bilateral PRAFO's Restrictions Weight Bearing Restrictions: Yes RUE Weight Bearing: Weight bearing as tolerated LUE Weight Bearing: Weight bearing as tolerated RLE Weight Bearing: Weight bearing as tolerated LLE Weight Bearing: Weight  bearing as tolerated    Mobility  Bed Mobility Overal bed mobility: Needs Assistance;+ 2 for safety/equipment;+2 for physical assistance Bed Mobility: Supine to Sit;Sit to Sidelying Rolling: Min assist Sidelying to sit: Min assist Supine to sit: Mod assist   Sit to sidelying: Mod assist;+2 for physical assistance;+2 for safety/equipment General bed mobility comments: Pt needed hand held assist and support to rise from supine to sitting due to weakness and pain in R shoulder. Required assist to lift legs into bed from sitting to sidelying.  Transfers Overall transfer level: Needs assistance Equipment used: Rolling walker (2 wheeled) Transfers: Sit to/from Stand Sit to Stand: +2 physical assistance;+2 safety/equipment;Mod assist Stand pivot transfers: Min assist;+2 physical assistance       General transfer comment: Patient had difficulty transfering from sit to stand due to pain in R shoulder and redicular pain in R leg.              Balance Overall balance assessment: Needs assistance Sitting-balance support: Single extremity supported;Feet supported Sitting balance-[name]: Good Sitting balance - Comments: Pt was able to sit at EOB for 10 min. She reported light headedness when first rising. All vitles looked normal during this time.   Standing balance support: Bilateral upper extremity supported Standing balance-[name]: Poor                      Cognition Arousal/Alertness: Awake/alert Behavior During Therapy: Flat affect Overall Cognitive Status: Within Functional Limits for tasks assessed                             Pertinent Vitals/Pain Pain Assessment: 0-10 Pain Score: 8  Pain Location: Low  back radiating down R LE, Right shoulder Pain Descriptors / Indicators: Aching;Grimacing;Discomfort;Squeezing Pain Intervention(s): Limited activity within patient's tolerance           PT Goals (current goals can now be found in the care  plan section) Acute Rehab PT Goals Patient Stated Goal: none stated PT Goal Formulation: With patient Time For Goal Achievement: 05/17/16 Potential to Achieve Goals: Good Progress towards PT goals: Progressing toward goals    Frequency  Min 5X/week    PT Plan Current plan remains appropriate       End of Session Equipment Utilized During Treatment: Gait belt Activity Tolerance: Patient limited by pain Patient left: in bed;with call bell/phone within reach;with nursing/sitter in room     Time: 1630-1700 PT Time Calculation (min) (ACUTE ONLY): 30 min  Charges:                       G CodesKallie Locks:      Vinton Layson, Leda GauzeSPTA [phone] 05/04/2016, 5:46 PM

## 2016-05-05 ENCOUNTER — Inpatient Hospital Stay (HOSPITAL_COMMUNITY): Payer: Medicaid Other

## 2016-05-05 MED ORDER — NAPROXEN 250 MG PO TABS
500.0000 mg | ORAL_TABLET | Freq: Two times a day (BID) | ORAL | Status: DC
  Administered 2016-05-05 – 2016-05-12 (×13): 500 mg via ORAL
  Filled 2016-05-05 (×14): qty 2

## 2016-05-05 MED ORDER — METHOCARBAMOL 500 MG PO TABS
1000.0000 mg | ORAL_TABLET | Freq: Four times a day (QID) | ORAL | Status: DC
  Administered 2016-05-05 – 2016-05-09 (×12): 1000 mg via ORAL
  Administered 2016-05-10: 500 mg via ORAL
  Filled 2016-05-05 (×15): qty 2

## 2016-05-05 NOTE — Progress Notes (Signed)
Physical Therapy Treatment Patient Details Name: [name] Bell MRN: [id number] DOB: 1980-12-16 Today's Date: 05/05/2016    History of Present Illness [name] Bell is a 35 y.o. [facility] s/p attempted suicide: taped DNR note to chest, drank Drano, then jumped [place] 5 [place]S/p 02/09/16 gel foam embolization of bil internal iliac arteries.S/p 02/09/16 ex lap with cholecystectomy, resection SB and mesenteric repair, liver lac repair, application or wound vac.S/p 02/09/16 closed reduction of pelvic ring, external pelvic fixation.Also has right metacarpal fracture in splint. Pt on and off intubation via trach/ and on/off trac collar.  Pelvic x- fixator pins removed at bedside on 03/23/16.  Underwent thoracotomy 6/19 due to empyema/abcess with 2 chest tubes, one d/c 04/30/16, second d/c 05/05/16 and pt no longer has trach.  Pt with no significant PMHx listed in the chart.     PT Comments    Pt tearful requiring reassurance and relaxation strategies during session today.  Her mobility is significantly limited by right sided low back pain and right leg pain that she first reported yesterday 05/04/16.  She is starting to mobilize well to EOB, but continues to require two person assist for safety at attempts at short distance gait.  She seems to verbalize a loss of control in her decision making (she would like more choices).    Follow Up Recommendations  Other (comment) (inpatient psych hospital?)     Equipment Recommendations  Wheelchair (measurements PT);Wheelchair cushion (measurements PT);Hospital bed;Other (comment);3in1 (PT);Rolling walker with 5" wheels    Recommendations for Other Services   NA     Precautions / Restrictions Precautions Precautions: Fall Required Braces or Orthoses: Other Brace/Splint Other Brace/Splint: L wrist spint Restrictions Weight Bearing Restrictions: No RUE Weight Bearing: Weight bearing as tolerated LUE Weight  Bearing: Weight bearing as tolerated RLE Weight Bearing: Weight bearing as tolerated LLE Weight Bearing: Weight bearing as tolerated    Mobility  Bed Mobility               General bed mobility comments: Pt was seated EOB working with OT  Transfers Overall transfer level: Needs assistance Equipment used: Rolling walker (2 wheeled) Transfers: Stand Pivot Transfers Sit to Stand: +2 physical assistance;Min assist         General transfer comment: Pt needed support at trunk during transition to stand.  Verbal cues for safe hand placement, grimacing throughout transitions.   Ambulation/Gait Ambulation/Gait assistance: +2 physical assistance;Min assist Ambulation Distance (Feet): 5 Feet Assistive device: Rolling walker (2 wheeled) Gait Pattern/deviations: Step-to pattern;Shuffle;Trunk flexed     General Gait Details: Pt walked 3-4 steps away from the bed and needed chair pulled up behind her due to severe right leg pain with gait.               Balance Overall balance assessment: Needs assistance Sitting-balance support: Feet supported;Bilateral upper extremity supported;No upper extremity supported Sitting balance-[name]: Good     Standing balance support: Bilateral upper extremity supported Standing balance-[name]: Poor                      Cognition Arousal/Alertness: Awake/alert Behavior During Therapy: Flat affect (crying, a bit overwhelmed it seems today) Overall Cognitive Status: Within Functional Limits for tasks assessed (Not specifically tested)                         General Comments General comments (skin integrity, edema, etc.):  See OT note for details as well.  She seemed to enjoy getting outside in the solarium.       Pertinent Vitals/Pain Pain Assessment: Faces Faces Pain Scale: Hurts whole lot Pain Location: low back and right leg with mobility and giat Pain Descriptors / Indicators: Grimacing;Guarding Pain  Intervention(s): Limited activity within patient's tolerance;Monitored during session;Repositioned           PT Goals (current goals can now be found in the care plan section) Acute Rehab PT Goals Patient Stated Goal: to get out of the hospital, decrase pain.  Progress towards PT goals: Not progressing toward goals - comment (due to right leg and low back pain)    Frequency  Min 5X/week    PT Plan Current plan remains appropriate    Co-evaluation PT/OT/SLP Co-Evaluation/Treatment: Yes Reason for Co-Treatment: Complexity of the patient's impairments (multi-system involvement);Necessary to address cognition/behavior during functional activity PT goals addressed during session: Mobility/safety with mobility;Balance;Proper use of DME;Strengthening/ROM       End of Session Equipment Utilized During Treatment: Gait belt Activity Tolerance: Patient limited by pain Patient left: in chair;Other (comment) (in chair with OT)     Time: 1415-1440 PT Time Calculation (min) (ACUTE ONLY): 25 min  Charges:  $Therapeutic Activity: 8-22 mins                      Chucky Homes B. Nataki Mccrumb, PT, DPT [phone]   05/05/2016, 10:31 PM

## 2016-05-05 NOTE — Progress Notes (Signed)
Patient ID: [name] Bell, female   DOB: 20-Jan-1981, 35 y.o.   MRN: [id number]   LOS: 86 days   Subjective: Doing ok, CT to come out today. Note from PT mentioned right back pain with radicular sx. She confirms, says it's been present since about Friday, some tingling in toes, cramping in lower leg, shooting pain down right leg.   Objective: Vital signs in last 24 hours: Temp:  [97.9 F (36.6 C)-99 F (37.2 C)] 97.9 F (36.6 C) (06/28 0759) Pulse Rate:  [75-89] 75 (06/28 0759) Resp:  [17-33] 20 (06/28 0759) BP: (129-151)/(86-97) 151/97 mmHg (06/28 0759) SpO2:  [97 %-100 %] 99 % (06/28 0759) Last BM Date: 05/01/16   Physical Exam General appearance: alert and no distress Back: Generalized right back pain, mid-thoracic to buttock Resp: clear to auscultation bilaterally Cardio: regular rate and rhythm GI: normal findings: bowel sounds normal and soft Extremities: RLE: No weakness, no sensory changes except tingling with plantar flexion   Assessment/Plan: Jump from hotel/multiple toxic ingestions -Continue on Zoloft, sitter for safety.  S/P ex lap, hepatorraphy, SBR, cholecystectomy, preperitoneal pelvic packing [name] 4/3  Ex lap for acidosis/bleeding 4/4 Thompson Ex lap for bleeding 4/5 [name] S/P ex fix pelvis [name] 4/3 S/P ex lap 4/7 Thompson S/P ex lap 4/10 Thompson S/P sacral screws 4/10 [name] S/P application ABRA, open gastrostomy tube 4/12 [name]  S/P abd closure, trach 4/24 [name] Mult B rib FX/R HPTX, B pulm contusions - IR placed Chest tube drain (03/27/16) for pleural effusion, TCTS following.Pigtail drain placed 5/20 and removed 04/10/16 Multiple toxic ingestions Mult L wrist FXs - splint until f/u with Dr. Melvyn Novasrtmann as OP, can WBAT Pelvic FX - S/P ex fix and angioembolization, S/P sacral screw by Dr. Carola FrostHandy. WBAT BLE. ABL anemia Right empyema s/p thoracotomy -- Per TTS, on Maxipime, CT to come out today FEN - Will treat back pain conservatively for now with NSAID's  and muscle relaxers VTE - SCD's, Lovenox Dispo - Transfer to floor    Freeman CaldronMichael J. Vrinda Heckstall, PA-C Pager: [phone] General Trauma PA Pager: [phone]  05/05/2016

## 2016-05-05 NOTE — Progress Notes (Signed)
Occupational Therapy Treatment Patient Details Name: [name] BobKatia D Romagnoli MRN: [id number] DOB: February 07, 1981 Today's Date: 05/05/2016    History of present illness [name] Bell is a 35 y.o. female.She is obese. PMH unknown.Pt attempted suicide: taped DNR note to chest, drank Drano, then jumped off 5 story building ([address]).S/p 02/09/16 gel foam embolization of bil internal iliac arteries.S/p 02/09/16 ex lap with cholecystectomy, resection SB and mesenteric repair, liver lac repair, application or wound vac.S/p 02/09/16 closed reduction of pelvic ring, external pelvic fixation.Also has right metacarpal fracture in splint. Pt on and off intubation via trach/ and on/off trac collar.  Pelvic x- fixator pins removed at bedside on 03/23/16.  Underwent thoracotomy 6/19 due to empyema/abcess with 2 chest tubes, one d/c 04/30/16, second d/c 05/05/16 and pt no longer has trach.     OT comments  Increased time spent with pt today to reach goal of going to [facility]. Pt complaining of increased pain today (R hip/pain) with mobility. Pt initially declining, stating "this is not a good time", tearful/crying/throwing up at times, but eventually with increased time, use of relaxation techniques and giving pt ability to express feelings of concern, she was able to achieve goal of reaching [facility]. Pt spent time outside and was smiling/laughing appropriately and began talking about her family and how she recently moved to [place] from [place] to "help her sister" Pt thanked therapist for spending time with her and helping her "get outside for the first time in 3 months". After return to room, pt asked for the coloring books that were brought to her earlier today.  Will continue to follow acutely and attempt to keep with primary therapist to continue develop rapport and progress rehab. Trauma aware of increased complaints of pain.   Follow Up Recommendations  CIR;Supervision/Assistance - 24 hour    Equipment Recommendations  3  in 1 bedside comode    Recommendations for Other Services Other (comment)    Precautions / Restrictions Precautions Precautions: Fall Required Braces or Orthoses: Other Brace/Splint Other Brace/Splint: L wrist spint Restrictions Weight Bearing Restrictions: Yes RUE Weight Bearing: Weight bearing as tolerated LUE Weight Bearing: Weight bearing as tolerated RLE Weight Bearing: Weight bearing as tolerated LLE Weight Bearing: Weight bearing as tolerated       Mobility Bed Mobility     Rolling: Min assist Sidelying to sit: Mod assist;HOB elevated       General bed mobility comments: Pt was seated EOB working with OT  Transfers Overall transfer level: Needs assistance Equipment used: Rolling walker (2 wheeled) Transfers: Sit to/from [employer] CorporationStand;Stand Pivot Transfers Sit to Stand: +2 physical assistance;Min assist Stand pivot transfers: +2 physical assistance;Min assist            Balance             Standing balance-[name]: Poor                     ADL Overall ADL's : Needs assistance/impaired                                     Functional mobility during ADLs: Minimal assistance;+2 for physical assistance;Rolling walker General ADL Comments: focus of session on addressing goals and plan to reach ultimate goal of "getting out of the hospital". Pt required mod A with bed mobility today due to complaints of hip pain. Initially had to wait for pt to pee in  her brief and then pt assisted with hygiene after toielting at bed level by rolling side/side in supine. Pt became agitated and crying, stating " everyone keeps demanding that I use the BSC, and I cnat" After calming pt, discussed why she wanted to contiue to use the brief and she staed it was due to pain when sitting on BSC. discussed goal of using BSC and trying options to make it more comfortable for her. Pt in agreement. Pt then transferred to recliner with PT and took a few steps to chair  with increased complaints of R hip/back pain. After calming pt using slow/deep breathing and relaxation techniques, pt agreeable to going to solarium. Pt pushed to selarium in recliner and sat outdoors for awhile. Pt smiling and stated "I haven't been outside in 3 months, this is so nice, thank you". Pt saw piano in slarium and voiced interest in playing piano. On return to room, pt saw several ICU nurses and held an appropriate conversation with the nurses. On reutrn to room, pt asked to color in coloring books brought to her earlier.       Vision    wears glasses                 Perception     Praxis      Cognition   Behavior During Therapy: Flat affect (more interactive today; smiling/laughing at times); more interactive today; brighter affect Overall Cognitive Status: Within Functional Limits for tasks assessed                       Extremity/Trunk Assessment     BUE generalized weakness          Exercises     Shoulder Instructions       General Comments      Pertinent Vitals/ Pain       Pain Assessment: Faces Faces Pain Scale: Hurts even more Pain Location: back/stomach Pain Descriptors / Indicators: Grimacing;Guarding;Crying Pain Intervention(s): Limited activity within patient's tolerance  VSS.hil  Home Living                                          Prior Functioning/Environment              Frequency Min 3X/week     Progress Toward Goals  OT Goals(current goals can now be found in the care plan section)  Progress towards OT goals: Progressing toward goals  Acute Rehab OT Goals Patient Stated Goal: to get out of the hospital, decrase pain.  OT Goal Formulation: With patient Time For Goal Achievement: 05/18/16 Potential to Achieve Goals: Good ADL Goals Pt Will Perform Lower Body Bathing: with min assist;with adaptive equipment;sit to/from stand Pt Will Transfer to Toilet: bedside commode;with min guard  assist;stand pivot transfer Pt Will Perform Toileting - Clothing Manipulation and hygiene: with min assist;sit to/from stand Pt/caregiver will Perform Home Exercise Program: Both right and left upper extremity;With written HEP provided;With Supervision Additional ADL Goal #1: Pt will complete meaningful activity with minimal encouragement Additional ADL Goal #2: Pt will sit unsupported for 5 minutes at min guard (A) level as precursor to adls. Additional ADL Goal #3: Pt will demonstrate sit <>Stand with total +2 Mod (A)  Additional ADL Goal #4: Complete bed mobility with S for ADL  Plan Discharge plan remains appropriate    Co-evaluation  End of Session Equipment Utilized During Treatment: Gait belt;Rolling walker   Activity Tolerance Patient tolerated treatment well   Patient Left in chair;with call bell/phone within reach;with nursing/sitter in room   Nurse Communication Mobility status        Time: 1401-1456 OT Time Calculation (min): 55 min  Charges:    Willella Harding,HILLARY 05/05/2016, 3:22 PM   [facility] Kabrea Seeney, OTR/L  [phone] 05/05/2016

## 2016-05-05 NOTE — Progress Notes (Signed)
Attempted to ambulate, per MD order and assist patient to chair for breakfast but patient refused. Will continue to monitor and will try again later.

## 2016-05-05 NOTE — Progress Notes (Addendum)
      [address]       [place] [id number]             [phone] 883 2018      9 Days Post-Op Procedure(s) (LRB): Right THORACOTOMY AND DRAINAGE OF EMPYEMA (Right) Subjective: Feels ok, mild abdominal soreness  Objective: Vital signs in last 24 hours: Temp:  [97.9 F (36.6 C)-99 F (37.2 C)] 97.9 F (36.6 C) (06/28 0759) Pulse Rate:  [75-89] 75 (06/28 0759) Cardiac Rhythm:  [-] Normal sinus rhythm (06/28 0759) Resp:  [17-33] 20 (06/28 0759) BP: (129-151)/(86-97) 151/97 mmHg (06/28 0759) SpO2:  [97 %-100 %] 99 % (06/28 0759)  Hemodynamic parameters for last 24 hours:    Intake/Output from previous day: 06/27 0701 - 06/28 0700 In: 730 [P.O.:480; I.V.:200; IV Piggyback:50] Out: 0  Intake/Output this shift: Total I/O In: 240 [P.O.:240] Out: 50 [Chest Tube:50]  General appearance: alert, cooperative and no distress Heart: regular rate and rhythm Lungs: dim right base Abdomen: benign Extremities: no edema Wound: dressings dry intact  Lab Results: No results for input(s): WBC, HGB, HCT, PLT in the last 72 hours. BMET:  Recent Labs  05/03/16 0423  NA 139  K 3.9  CL 106  CO2 28  GLUCOSE 107*  BUN 16  CREATININE 1.46*  CALCIUM 9.1    PT/INR: No results for input(s): LABPROT, INR in the last 72 hours. ABG    Component Value Date/Time   PHART 7.508* 02/28/2016 0642   HCO3 25.5* 02/28/2016 0642   TCO2 26.5 02/28/2016 0642   ACIDBASEDEF 2.0 02/13/2016 0411   O2SAT 99.2 02/28/2016 0642   CBG (last 3)   Recent Labs  05/03/16 2314 05/04/16 0507 05/04/16 1124  GLUCAP 142* 120* 145*    Meds Scheduled Meds: . bacitracin   Topical BID  . bisacodyl  10 mg Oral Daily  . ceFEPime (MAXIPIME) IV  2 g Intravenous Q12H  . clonazePAM  0.5 mg Oral QHS  . dextromethorphan-guaiFENesin  1 tablet Oral BID  . enoxaparin (LOVENOX) injection  30 mg Subcutaneous Q12H  . feeding supplement (ENSURE ENLIVE)  237 mL Oral 6 X Daily  . ferrous sulfate  325 mg  Oral TID WC  . pantoprazole  40 mg Oral Daily  . polyethylene glycol  17 g Oral Daily  . senna-docusate  1 tablet Oral QHS  . sertraline  100 mg Oral Daily   Continuous Infusions:  PRN Meds:.docusate sodium, hydrALAZINE, ipratropium-albuterol, LORazepam, oxyCODONE, simethicone  Xrays No results found.  Assessment/Plan: S/P Procedure(s) (LRB): Right THORACOTOMY AND DRAINAGE OF EMPYEMA (Right)  1 stable, continuing to progress 2 remaining fluid is probably loculated and pleural thickening will take long time to resolve- d/c ct 3 poss convert to po abx soon  LOS: 86 days    GOLD,WAYNE E 05/05/2016  Agree with plan to remove remaining chest tube Chest x-ray in a.m.  patient examined and medical record reviewed,agree with above note. Kathlee Nationseter Van Trigt III 05/05/2016

## 2016-05-06 ENCOUNTER — Inpatient Hospital Stay (HOSPITAL_COMMUNITY): Payer: Medicaid Other

## 2016-05-06 MED ORDER — FLEET ENEMA 7-19 GM/118ML RE ENEM: 1.0000 | ENEMA | Freq: Once | RECTAL | Status: DC

## 2016-05-06 MED ORDER — DEXTROSE 5 % IV SOLN
2.0000 g | Freq: Three times a day (TID) | INTRAVENOUS | Status: DC
  Administered 2016-05-06 – 2016-05-07 (×3): 2 g via INTRAVENOUS
  Filled 2016-05-06 (×4): qty 2

## 2016-05-06 NOTE — Progress Notes (Signed)
Patient ID: [name] Bell, female   DOB: 19-Aug-1981, 35 y.o.   MRN: [id number]   LOS: 87 days   Subjective: Feeling better, back improved and diet improving.   Objective: Vital signs in last 24 hours: Temp:  [97.8 F (36.6 C)-98.6 F (37 C)] 98.6 F (37 C) (06/29 0532) Pulse Rate:  [77-83] 80 (06/29 0532) Resp:  [18-20] 18 (06/29 0532) BP: (136-155)/(89-97) 148/96 mmHg (06/29 0532) SpO2:  [96 %-100 %] 98 % (06/29 0532) Weight:  [84.687 kg (186 lb 11.2 oz)] 84.687 kg (186 lb 11.2 oz) (06/29 0532) Last BM Date: 05/02/16   Radiology Results PORTABLE CHEST 1 VIEW  COMPARISON: Yesterday.  FINDINGS: Interval ill-defined opacity in the right lower lung zone. No significant change in multiple displaced right rib fractures and pleural fluid/blood. Clear left lung. Mild scoliosis. No pneumothorax.  IMPRESSION: Interval ill-defined opacity in the right lower lung zone. This most likely represents pleural fluid in the major fissure. Airspace opacity is less likely but not excluded.   Electronically Signed  By: Beckie SaltsSteven Reid M.D.  On: 05/06/2016 07:44   Physical Exam General appearance: alert and no distress Resp: clear to auscultation bilaterally Cardio: regular rate and rhythm GI: normal findings: bowel sounds normal and soft, non-tender   Assessment/Plan: Jump from hotel/multiple toxic ingestions -Continue on Zoloft, sitter for safety.  S/P ex lap, hepatorraphy, SBR, cholecystectomy, preperitoneal pelvic packing [name] 4/3  Ex lap for acidosis/bleeding 4/4 Thompson Ex lap for bleeding 4/5 [name] S/P ex fix pelvis [name] 4/3 S/P ex lap 4/7 Thompson S/P ex lap 4/10 Thompson S/P sacral screws 4/10 [name] S/P application ABRA, open gastrostomy tube 4/12 [name]  S/P abd closure, trach 4/24 [name] Mult B rib FX/R HPTX, B pulm contusions  Multiple toxic ingestions Mult L wrist FXs - splint until f/u with Dr. Melvyn Novasrtmann as OP, can WBAT Pelvic FX - S/P ex fix and  angioembolization, S/P sacral screw by Dr. Carola FrostHandy. WBAT BLE. ABL anemia Right empyema s/p thoracotomy -- Per TTS, on Maxipime FEN - No issues VTE - SCD's, Lovenox Dispo - [facility]    Freeman CaldronMichael J. Kamaryn Grimley, PA-C Pager: [phone] General Trauma PA Pager: [phone]  05/06/2016

## 2016-05-06 NOTE — Progress Notes (Signed)
Pharmacy Antibiotic Note  [name] Bell is a 35 y.o. female admitted on 02/09/2016 s/p fall from building with multiple orthopedic and internal organ injuries.  Pharmacy has been consulted to manage Cefepime for empyema, now s/p thoracotomy.  Patient's renal function is improving, will increase dose. Today is day #10 of cefepime.  Plan: - Increase cefepime to 2g/8h - Monitor renal fxn, abx LOT  Height: 5\' 11"  (180.3 cm) Weight: 186 lb 11.2 oz (84.687 kg) IBW/kg (Calculated) : 70.8  Temp (24hrs), Avg:98.3 F (36.8 C), Min:97.9 F (36.6 C), Max:98.6 F (37 C)   Recent Labs Lab 04/30/16 1000 05/01/16 0038 05/03/16 0423  WBC 15.7*  --   --   CREATININE  --  1.84* 1.46*    Estimated Creatinine Clearance: 61.8 mL/min (by C-G formula based on Cr of 1.46).    No Known Allergies  Antimicrobials this admission: Cefepime 4/29 >> 5/1; 5/18 x2 doses, 6/20 >> Levofloxacin 5/1 >> 5/3 Ceftriaxone 5/19 >> 5/23 Bactrim 5/24 >> 5/27 Fluconazole 5/26 x1 Vanc 6/15>>6/21 Zosyn 6/15 >>6/20  Dose adjustments this admission: 6/20 VT: 60 mcg/mL > held  Microbiology results: 6/19 R empyema - Rare Enterobacter Cloacae (S-Cefepmie, Cipro, gent, Primaxin, Septra) 6/15 chest wound - negative 5/20 CT drainage - Enterobacter (R roceph; sens bactrim, FQN) 5/17 TA - Enterobacter (R only to cefazolin) 5/6, 5/10, 5/16 TA - normal flora 4/26 TA - Enterobacter cloacae (S- cefepime, cipro, aminoglycosides, Bactrim)      [name] GamesAlison Nakota Bell, PharmD, BCPS Clinical Pharmacist 05/06/2016 1:18 PM

## 2016-05-06 NOTE — Progress Notes (Signed)
Brace noted on LFA.  RA scanned for PIV.  Only vein suitable by US is directly distal to the infiltration site- red, tender and swollen.  Attempted x1 in superficial vein RUA, able to access vein but unable to thread PIV.  Pt tolerated with tension and anxiety.  Refused second attempt by this IV RN.

## 2016-05-06 NOTE — Progress Notes (Signed)
PT Cancellation Note  Patient Details Name: Phylliss BobKatia D Agena MRN: [id number] DOB: 10/06/1981   Cancelled Treatment:    Reason Eval/Treat Not Completed: Fatigue/lethargy limiting ability to participate.  Pt reports exhaustion.  She would like PT to try to check back tomorrow AM so that we can plan our mobility together. She really enjoyed going outside yesterday and getting out of her hospital room.  Thanks,    Rollene Rotundaebecca B. Shonette Rhames, PT, DPT [phone]   05/06/2016, 6:17 PM

## 2016-05-07 MED ORDER — FLEET ENEMA 7-19 GM/118ML RE ENEM
1.0000 | ENEMA | Freq: Every day | RECTAL | Status: DC | PRN
  Administered 2016-05-09: 1 via RECTAL
  Filled 2016-05-07 (×2): qty 1

## 2016-05-07 MED ORDER — CIPROFLOXACIN HCL 500 MG PO TABS
500.0000 mg | ORAL_TABLET | Freq: Two times a day (BID) | ORAL | Status: DC
  Administered 2016-05-07 – 2016-05-10 (×7): 500 mg via ORAL
  Filled 2016-05-07 (×7): qty 1

## 2016-05-07 MED ORDER — PANTOPRAZOLE SODIUM 40 MG PO TBEC
80.0000 mg | DELAYED_RELEASE_TABLET | Freq: Two times a day (BID) | ORAL | Status: DC
  Administered 2016-05-07 – 2016-05-10 (×7): 80 mg via ORAL
  Filled 2016-05-07 (×7): qty 2

## 2016-05-07 NOTE — Progress Notes (Signed)
Physical Therapy Treatment Patient Details Name: [name] BobKatia D Chaput MRN: [id number] DOB: 1981/02/18 Today's Date: 05/07/2016    History of Present Illness [name] Bell is a 35 y.o. female [facility] s/p attempted suicide: taped DNR note to chest, drank Drano, then jumped off 5 [address] building ([address]S/p 02/09/16 gel foam embolization of bil internal iliac arteries.S/p 02/09/16 ex lap with cholecystectomy, resection SB and mesenteric repair, liver lac repair, application or wound vac.S/p 02/09/16 closed reduction of pelvic ring, external pelvic fixation.Also has right metacarpal fracture in splint. Pt on and off intubation via trach/ and on/off trac collar.  Pelvic x- fixator pins removed at bedside on 03/23/16.  Underwent thoracotomy 6/19 due to empyema/abcess with 2 chest tubes, one d/c 04/30/16, second d/c 05/05/16 and pt no longer has trach.  Pt with no significant PMHx listed in the chart.     PT Comments    Patient has a good attitude today and was smiling once walking out in the hallway. Her sister was present for the last half of the session. She required frequent VC to remember proper techniques for bed mobilities and transfers. Pt was still experiencing LBP but her radicular pain was gone, but she was still experiencing numbness in her R foot. Her pain did increase with movement and she required moments of rest before continuing with therapy. She is guarding the right side since having her chest tube removed.   Follow Up Recommendations  Other (comment) ((inpatient psych hospital?))     Equipment Recommendations  Wheelchair (measurements PT);Wheelchair cushion (measurements PT);Hospital bed;Other (comment);3in1 (PT);Rolling walker with 5" wheels       Precautions / Restrictions Precautions Precautions: Fall Restrictions Weight Bearing Restrictions: Yes RUE Weight Bearing: Weight bearing as tolerated LUE Weight Bearing: Weight bearing as tolerated RLE Weight  Bearing: Weight bearing as tolerated LLE Weight Bearing: Weight bearing as tolerated    Mobility  Bed Mobility Overal bed mobility: supervision Bed Mobility: Supine to Sit   Supine to sit: supervision;HOB elevated     General bed mobility comments: Increase in LBP when performing bed mobilities. Pt requries frequent pauses inbetween movements due to pain.  Transfers Overall transfer level: Needs assistance Equipment used: Rolling walker (2 wheeled) Transfers: Sit to/from [employer]Stand;Stand Pivot Transfers Sit to Stand: +2 physical assistance;Min assist      General transfer comment: Required two attemps to rise from EOB. Used rail on the right for support when standing. VC required to remind pt to scoot forward before standing and hand pacement.  Ambulation/Gait Ambulation/Gait assistance: +2 physical assistance;Min assist Ambulation Distance (Feet): 80 Feet (with break) Assistive device: Rolling walker (2 wheeled) Gait Pattern/deviations: Step-through pattern;Shuffle ((RLE externally rotated; VC requried to stand up straight))   Gait velocity interpretation: Below normal speed for age/gender General Gait Details: Pt. walked down the hallway and took a seated break before continuing.      Balance Overall balance assessment: Needs assistance Sitting-balance support: Feet supported;Single extremity supported Sitting balance-[name]: Good Sitting balance - Comments: pt was able to sit at EOB and and adjust socks w/o assistance.   Standing balance support: Bilateral upper extremity supported Standing balance-[name]: Poor Standing balance comment: reliance on RW                    Cognition Arousal/Alertness: Awake/alert Behavior During Therapy: WFL for tasks assessed/performed Overall Cognitive Status: Within Functional Limits for tasks assessed  General Comments General comments (skin integrity, edema, etc.): Pt was excited once  getting out of bed and moving into the hallway. She smiled and stated she was "proud of herself". She started crying because she was happy. Her sister walked into the session halfway through and seemed to raise pt's spirits.      Pertinent Vitals/Pain Pain Assessment: 0-10 Pain Score: 7  Pain Location: back Pain Descriptors / Indicators: Aching;Discomfort;Radiating Pain Intervention(s): Limited activity within patient's tolerance;Repositioned           PT Goals (current goals can now be found in the care plan section) Acute Rehab PT Goals Patient Stated Goal: to get out of the hospital, decrase pain.  Progress towards PT goals: Progressing toward goals    Frequency  Min 5X/week    PT Plan Current plan remains appropriate    Co-evaluation PT/OT/SLP Co-Evaluation/Treatment: Yes Reason for Co-Treatment: Complexity of the patient's impairments (multi-system involvement);Necessary to address cognition/behavior during functional activity;For patient/therapist safety PT goals addressed during session: Mobility/safety with mobility;Balance;Proper use of DME OT goals addressed during session: ADL's and self-care;Strengthening/ROM     End of Session Equipment Utilized During Treatment: Gait belt Activity Tolerance: Patient tolerated treatment well;Patient limited by pain Patient left: in chair;with nursing/sitter in room;with family/visitor present;with call bell/phone within reach     Time: 1105-1143 PT Time Calculation (min) (ACUTE ONLY): 38 min  Charges:                       G CodesKallie Locks:      Cathe Bilger, Leda GauzeSPTA [phone] 05/07/2016, 1:56 PM

## 2016-05-07 NOTE — Progress Notes (Signed)
Nutrition Follow-up  DOCUMENTATION CODES:   Severe malnutrition in context of acute illness/injury  INTERVENTION:  Continue Ensure Enlive po 6 times daily, each supplement provides 350 kcal and 20 grams of protein.  Continue a regular diet.  Encourage adequate PO intake.   NUTRITION DIAGNOSIS:   Increased nutrient needs related to  (trauma) as evidenced by estimated needs; ongoing  GOAL:   Patient will meet greater than or equal to 90% of their needs; progressing  MONITOR:   PO intake, Supplement acceptance, Labs, Weight trends, Skin, I & O's  REASON FOR ASSESSMENT:   Consult Calorie Count  ASSESSMENT:   Pt from [place] with no past medical hx admitted after suicide attempt by ingestion of Drano, 2 bottles of acetaminophen, nyquil, and possibly 6 alprazolam pills and jumping from 5th floor balcony of hotel. 4/3 pt is s/p exp lap, hepatorraphy, SBR, cholecystectomy, preperitoneal pelvic packing, ex fix pelvis, R HPTX, Bil pulmonary contusions. Plan for repeat OR 4/5 for another possible SBR. Abdomen remains open.  S/P right thoracotomy and drainage of empyema on 6/19. Chest tube removed.  Pt was unavailable during time of visit. Most recent meal completion has been 80-100% with 100% at breakfast this AM. Intake has improved. Pt currently has Ensure ordered with a shake consumed once this AM. Improvement in intake only has started over the past two days. RD to continue with current supplement order just in case current intake becomes inconsistent. RD to continue to monitor.   Diet Order:  Diet regular Room service appropriate?: Yes; Fluid consistency:: Thin  Skin:  Reviewed, no issues (Incisions)  Last BM:  6/25  Height:   Ht Readings from Last 1 Encounters:  04/26/16 5\' 11"  (1.803 m)    Weight:   Wt Readings from Last 1 Encounters:  05/06/16 186 lb 11.2 oz (84.687 kg)    Ideal Body Weight:  70.4 kg  BMI:  Body mass index is 26.05 kg/(m^2).  Estimated Nutritional  Needs:   Kcal:  2000-2200  Protein:  105-125 gm  Fluid:  2-2.2 L  EDUCATION NEEDS:   No education needs identified at this time  Roslyn SmilingStephanie Jaivion Kingsley, MS, RD, LDN Pager # [phone] After hours/ weekend pager # [phone]

## 2016-05-07 NOTE — Progress Notes (Addendum)
[address]       [place] [id number]             [phone]      11 Days Post-Op Procedure(s) (LRB): Right THORACOTOMY AND DRAINAGE OF EMPYEMA (Right) Subjective: Some SOB, getting stronger/improving exercise tolerance slowly  Objective: Vital signs in last 24 hours: Temp:  [97.8 F (36.6 C)-98.3 F (36.8 C)] 97.8 F (36.6 C) (06/30 0521) Pulse Rate:  [77-80] 77 (06/30 0521) Cardiac Rhythm:  [-] Normal sinus rhythm (06/29 2115) Resp:  [16-18] 18 (06/30 0521) BP: (143-146)/(92-98) 146/98 mmHg (06/30 0521) SpO2:  [98 %-100 %] 98 % (06/30 0521)  Hemodynamic parameters for last 24 hours:    Intake/Output from previous day: 06/29 0701 - 06/30 0700 In: 790 [P.O.:740; IV Piggyback:50] Out: -  Intake/Output this shift: Total I/O In: 120 [P.O.:120] Out: -   General appearance: alert, cooperative and no distress Heart: regular rate and rhythm Lungs: dim in right base Extremities: no edema Wound: healing well  Lab Results: No results for input(s): WBC, HGB, HCT, PLT in the last 72 hours. BMET: No results for input(s): NA, K, CL, CO2, GLUCOSE, BUN, CREATININE, CALCIUM in the last 72 hours.  PT/INR: No results for input(s): LABPROT, INR in the last 72 hours. ABG    Component Value Date/Time   PHART 7.508* 02/28/2016 0642   HCO3 25.5* 02/28/2016 0642   TCO2 26.5 02/28/2016 0642   ACIDBASEDEF 2.0 02/13/2016 0411   O2SAT 99.2 02/28/2016 0642   CBG (last 3)  No results for input(s): GLUCAP in the last 72 hours.  Meds Scheduled Meds: . bacitracin   Topical BID  . bisacodyl  10 mg Oral Daily  . ceFEPime (MAXIPIME) IV  2 g Intravenous Q8H  . clonazePAM  0.5 mg Oral QHS  . dextromethorphan-guaiFENesin  1 tablet Oral BID  . enoxaparin (LOVENOX) injection  30 mg Subcutaneous Q12H  . feeding supplement (ENSURE ENLIVE)  237 mL Oral 6 X Daily  . ferrous sulfate  325 mg Oral TID WC  . methocarbamol  1,000 mg Oral QID  . naproxen  500 mg Oral BID WC    . pantoprazole  80 mg Oral BID  . polyethylene glycol  17 g Oral Daily  . senna-docusate  1 tablet Oral QHS  . sertraline  100 mg Oral Daily  . sodium phosphate  1 enema Rectal Once   Continuous Infusions:  PRN Meds:.hydrALAZINE, ipratropium-albuterol, LORazepam, oxyCODONE, simethicone, sodium phosphate  Xrays Dg Chest Port 1 View  05/06/2016  CLINICAL DATA:  Recent chest tube removal. EXAM: PORTABLE CHEST 1 VIEW COMPARISON:  Yesterday. FINDINGS: Interval ill-defined opacity in the right lower lung zone. No significant change in multiple displaced right rib fractures and pleural fluid/blood. Clear left lung. Mild scoliosis. No pneumothorax. IMPRESSION: Interval ill-defined opacity in the right lower lung zone. This most likely represents pleural fluid in the major fissure. Airspace opacity is less likely but not excluded. Electronically Signed   By: Beckie SaltsSteven  Reid M.D.   On: 05/06/2016 07:44   Dg Chest Port 1 View  05/05/2016  CLINICAL DATA:  Right chest tube removal. EXAM: PORTABLE CHEST 1 VIEW COMPARISON:  05/03/2016. FINDINGS: 1637 hours. Low volume film with stable asymmetric elevation right hemidiaphragm. Right chest tube is removed in the interval without evidence for pneumothorax. In right pleural effusion persist. The right IJ central line has been removed in the interval. Left lung remains clear. Multiple right rib fractures again noted. Telemetry  leads overlie the chest. IMPRESSION: No evidence for pneumothorax status post chest tube removal. Right IJ central line removal. Otherwise stable exam. Electronically Signed   By: Kennith Center M.D.   On: 05/05/2016 16:43   Results for orders placed or performed during the hospital encounter of 02/09/16  MRSA PCR Screening     Status: None   Collection Time: 02/11/16 11:30 AM  Result Value Ref Range Status   MRSA by PCR NEGATIVE NEGATIVE Final    Comment:        The GeneXpert MRSA Assay (FDA approved for NASAL specimens only), is one  component of a comprehensive MRSA colonization surveillance program. It is not intended to diagnose MRSA infection nor to guide or monitor treatment for MRSA infections.   Anaerobic culture     Status: None   Collection Time: 02/26/16  1:19 PM  Result Value Ref Range Status   Specimen Description FLUID PLEURAL RIGHT  Final   Special Requests POF ANCEF  Final   Gram Stain   Final    DEGENERATED CELLULAR MATERIAL PRESENT NO ORGANISMS SEEN    Culture NO ANAEROBES ISOLATED  Final   Report Status 03/02/2016 FINAL  Final  Body fluid culture     Status: None   Collection Time: 02/26/16  1:19 PM  Result Value Ref Range Status   Specimen Description FLUID RIGHT PLEURAL  Final   Special Requests POF ANCEF  Final   Gram Stain   Final    DEGENERATED CELLULAR MATERIAL PRESENT NO ORGANISMS SEEN    Culture   Final    MULTIPLE ORGANISMS PRESENT, NONE PREDOMINANT CRITICAL RESULT CALLED TO, READ BACK BY AND VERIFIED WITH: J PINEDA 02/27/16 @ 1314 M VESTAL    Report Status 02/28/2016 FINAL  Final  Culture, respiratory (NON-Expectorated)     Status: None   Collection Time: 03/03/16  1:17 PM  Result Value Ref Range Status   Specimen Description TRACHEAL ASPIRATE  Final   Special Requests Normal  Final   Gram Stain   Final    FEW WBC PRESENT,BOTH PMN AND MONONUCLEAR NO SQUAMOUS EPITHELIAL CELLS SEEN NO ORGANISMS SEEN Performed at [facility]    Culture   Final    FEW ENTEROBACTER CLOACAE Performed at [facility]    Report Status 03/06/2016 FINAL  Final   Organism ID, Bacteria ENTEROBACTER CLOACAE  Final      Susceptibility   Enterobacter cloacae - MIC*    CEFAZOLIN >=64 RESISTANT Resistant     CEFEPIME <=1 SENSITIVE Sensitive     CEFTAZIDIME >=64 RESISTANT Resistant     CEFTRIAXONE >=64 RESISTANT Resistant     CIPROFLOXACIN <=0.25 SENSITIVE Sensitive     GENTAMICIN <=1 SENSITIVE Sensitive     IMIPENEM <=0.25 SENSITIVE Sensitive     PIP/TAZO >=128 RESISTANT  Resistant     TOBRAMYCIN <=1 SENSITIVE Sensitive     TRIMETH/SULFA Value in next row Sensitive      <=20 SENSITIVE(NOTE)    * FEW ENTEROBACTER CLOACAE  Culture, respiratory (NON-Expectorated)     Status: None   Collection Time: 03/13/16  3:50 PM  Result Value Ref Range Status   Specimen Description TRACHEAL ASPIRATE  Final   Special Requests Normal  Final   Gram Stain   Final    MODERATE WBC PRESENT, PREDOMINANTLY PMN MODERATE SQUAMOUS EPITHELIAL CELLS PRESENT FEW GRAM POSITIVE COCCI IN PAIRS IN CLUSTERS Performed at [facility]    Culture   Final    NORMAL OROPHARYNGEAL  FLORA [facility]formed at [facility]    Report Status 03/16/2016 FINAL  Final  Culture, respiratory (NON-Expectorated)     Status: None   Collection Time: 03/17/16 11:32 AM  Result Value Ref Range Status   Specimen Description TRACHEAL ASPIRATE  Final   Special Requests Normal  Final   Gram Stain   Final    FEW WBC PRESENT,BOTH PMN AND MONONUCLEAR RARE SQUAMOUS EPITHELIAL CELLS PRESENT NO ORGANISMS SEEN [facility]    Culture   Final    NORMAL OROPHARYNGEAL FLORA Performed at [facility]    Report Status 03/19/2016 FINAL  Final  Culture, respiratory (NON-Expectorated)     Status: None   Collection Time: 03/23/16  9:33 AM  Result Value Ref Range Status   Specimen Description TRACHEAL ASPIRATE  Final   Special Requests Normal  Final   Gram Stain   Final    ABUNDANT WBC PRESENT,BOTH PMN AND MONONUCLEAR FEW SQUAMOUS EPITHELIAL CELLS PRESENT FEW GRAM POSITIVE COCCI IN PAIRS [facility]formed [facility]    Culture   Final    MODERATE ENTEROBACTER CLOACAE Performed at [facility]    Report Status 03/26/2016 FINAL  Final   Organism ID, Bacteria ENTEROBACTER CLOACAE  Final      Susceptibility   Enterobacter cloacae - MIC*    CEFAZOLIN >=64 RESISTANT Resistant     CEFEPIME <=1 SENSITIVE Sensitive     CEFTAZIDIME <=1 SENSITIVE Sensitive      CEFTRIAXONE <=1 SENSITIVE Sensitive     CIPROFLOXACIN <=0.25 SENSITIVE Sensitive     GENTAMICIN <=1 SENSITIVE Sensitive     IMIPENEM 0.5 SENSITIVE Sensitive     PIP/TAZO 8 SENSITIVE Sensitive     TOBRAMYCIN <=1 SENSITIVE Sensitive     TRIMETH/SULFA Value in next row Sensitive      <=20 SENSITIVE(NOTE)    * MODERATE ENTEROBACTER CLOACAE  Culture, routine-abscess     Status: None   Collection Time: 03/27/16  2:03 PM  Result Value Ref Range Status   Specimen Description ABSCESS RIGHT CHEST DRAINAGE  Final   Special Requests Normal  Final   Gram Stain   Final    ABUNDANT WBC PRESENT, PREDOMINANTLY PMN RARE SQUAMOUS EPITHELIAL CELLS PRESENT ABUNDANT GRAM POSITIVE COCCI IN PAIRS [facility]formed [facility]    Culture   Final    FEW ENTEROBACTER CLOACAE Performed at [facility]    Report Status 03/30/2016 FINAL  Final   Organism ID, Bacteria ENTEROBACTER CLOACAE  Final      Susceptibility   Enterobacter cloacae - MIC*    CEFAZOLIN >=64 RESISTANT Resistant     CEFEPIME <=1 SENSITIVE Sensitive     CEFTAZIDIME >=64 RESISTANT Resistant     CEFTRIAXONE >=64 RESISTANT Resistant     CIPROFLOXACIN <=0.25 SENSITIVE Sensitive     GENTAMICIN <=1 SENSITIVE Sensitive     IMIPENEM <=0.25 SENSITIVE Sensitive     PIP/TAZO >=128 RESISTANT Resistant     TOBRAMYCIN <=1 SENSITIVE Sensitive     TRIMETH/SULFA Value in next row Sensitive      <=20 SENSITIVE(NOTE)    * FEW ENTEROBACTER CLOACAE  MRSA PCR Screening     Status: None   Collection Time: 03/30/16  6:51 PM  Result Value Ref Range Status   MRSA by PCR NEGATIVE NEGATIVE Final    Comment:        The GeneXpert MRSA Assay (FDA approved for NASAL specimens only), is one component of a comprehensive MRSA colonization surveillance  program. It is not intended to diagnose MRSA infection nor to guide or monitor treatment for MRSA infections.   Aerobic Culture (superficial specimen)     Status: None   Collection Time: 04/22/16  10:55 AM  Result Value Ref Range Status   Specimen Description WOUND CHEST  Final   Special Requests NONE  Final   Gram Stain   Final    ABUNDANT WBC PRESENT, PREDOMINANTLY PMN ABUNDANT GRAM POSITIVE COCCI IN PAIRS IN CLUSTERS RARE GRAM NEGATIVE COCCOBACILLI    Culture NORMAL SKIN FLORA  Final   Report Status 04/24/2016 FINAL  Final  Aerobic/Anaerobic Culture (surgical/deep wound)     Status: None   Collection Time: 04/26/16  2:50 PM  Result Value Ref Range Status   Specimen Description WOUND RIGHT CHEST EMPYEMA  Final   Special Requests SWABS PT ON VANCOMYCIN  Final   Gram Stain   Final    RARE WBC PRESENT,BOTH PMN AND MONONUCLEAR FEW GRAM POSITIVE COCCI    Culture   Final    ABUNDANT PEPTOSTREPTOCOCCUS SPECIES WITH NORMAL AEROBIC SKIN FLORA    Report Status 05/03/2016 FINAL  Final  Aerobic/Anaerobic Culture (surgical/deep wound)     Status: None   Collection Time: 04/26/16  3:05 PM  Result Value Ref Range Status   Specimen Description WOUND RIGHT CHEST EMPYEMA  Final   Special Requests PT ON VANC  Final   Gram Stain   Final    MODERATE WBC PRESENT,BOTH PMN AND MONONUCLEAR MODERATE GRAM POSITIVE COCCI IN PAIRS IN CHAINS    Culture   Final    RARE ENTEROBACTER CLOACAE ABUNDANT PEPTOSTREPTOCOCCUS SPECIES    Report Status 05/03/2016 FINAL  Final   Organism ID, Bacteria ENTEROBACTER CLOACAE  Final      Susceptibility   Enterobacter cloacae - MIC*    CEFAZOLIN >=64 RESISTANT Resistant     CEFEPIME <=1 SENSITIVE Sensitive     CEFTAZIDIME >=64 RESISTANT Resistant     CEFTRIAXONE >=64 RESISTANT Resistant     CIPROFLOXACIN <=0.25 SENSITIVE Sensitive     GENTAMICIN <=1 SENSITIVE Sensitive     IMIPENEM 1 SENSITIVE Sensitive     TRIMETH/SULFA <=20 SENSITIVE Sensitive     PIP/TAZO >=128 RESISTANT Resistant     * RARE ENTEROBACTER CLOACAE   Assessment/Plan: S/P Procedure(s) (LRB): Right THORACOTOMY AND DRAINAGE OF EMPYEMA (Right)  1 conts with steady progree 2 d/c  cefepime, cont cipro po x 14 days 3 d/c every other staple  LOS: 88 days    GOLD,WAYNE E 05/07/2016  Agree with above  Viviann Spare C. Dorris Fetch, MD [facility] [phone]

## 2016-05-07 NOTE — Progress Notes (Addendum)
Occupational Therapy Treatment Patient Details Name: [name] Bell MRN: [id number] DOB: September 09, 1981 Today's Date: 05/07/2016    History of present illness [name] Bell is a 35 y.o. female admitted to [facility] on 02/09/16 s/p attempted suicide: taped DNR note to chest, drank Drano, then jumped off 5 story building ([address] [place]).S/p 02/09/16 gel foam embolization of bil internal iliac arteries.S/p 02/09/16 ex lap with cholecystectomy, resection SB and mesenteric repair, liver lac repair, application or wound vac.S/p 02/09/16 closed reduction of pelvic ring, external pelvic fixation.Also has right metacarpal fracture in splint. Pt on and off intubation via trach/ and on/off trac collar.  Pelvic x- fixator pins removed at bedside on 03/23/16.  Underwent thoracotomy 6/19 due to empyema/abcess with 2 chest tubes, one d/c 04/30/16, second d/c 05/05/16 and pt no longer has trach.  Pt with no significant PMHx listed in the chart.    OT comments  Pt with brighter affect today, smiling and interacting appropriately. Pt able to get to the EOB (with increased HOB) with S today and ambulated @ 80 feet to sit in high back "regular chair" in lobby at elevator. Pt using relaxation techniques during mobility. Pt complains of back pain, but appears improved from previous sessions. After ambulating to lobby,  pt became tearful but stated they were "tears of joy".  Pt expressed feelings of being able to achieve her ultimate goal of getting "out of the hospital" by "taking it day by day". Pt is expecting visitors this pm from [place] who are planning on staying for the weekend and appears excited about the visit. Pt's sister present for second half of session and was very supportive and encouraging.  Pt responds better to structure, giving her he ability to have a sense of control. Pt asking for other crayon colors and states she plans to work on her "art" over the weekend. Pt excited about having her "lavender  aromatherapy". Highly recommend CIR for rehab to facilitate safe D/C to next venue of care. Will continue to follow acutely.   Follow Up Recommendations  CIR;Supervision/Assistance - 24 hour    Equipment Recommendations  3 in 1 bedside comode    Recommendations for Other Services      Precautions / Restrictions Precautions Precautions: Fall Restrictions Weight Bearing Restrictions: Yes RUE Weight Bearing: Weight bearing as tolerated LUE Weight Bearing: Weight bearing as tolerated RLE Weight Bearing: Weight bearing as tolerated LLE Weight Bearing: Weight bearing as tolerated       Mobility Bed Mobility Overal bed mobility: Needs Assistance Bed Mobility: Supine to Sit Rolling: Supervision   Supine to sit: HOB elevated;Supervision     General bed mobility comments: with increased time, pt able to sit EOB with S. Cues to remind pt of relaxation techniues during mobility.  Transfers Overall transfer level: Needs assistance Equipment used: Rolling walker (2 wheeled) Transfers: Sit to/from [employer] Sit to Stand: Min assist;From elevated surface         General transfer comment: cues for positioning to increase independence with sit - stand. Pt ableto push to stand with min A of 2 inimitially. Last trasnfer ptwas min gaurd    Balance Overall balance assessment: Needs assistance Sitting-balance support: Feet supported;Single extremity supported Sitting balance-[name]: Good Sitting balance - Comments: pt was able to sit at EOB and and adjust socks with assistance to stabalize her trunk.   Standing balance support: Bilateral upper extremity supported Standing balance-[name]: Poor Standing balance comment: reliance on RW  ADL                       Lower Body Dressing: Moderate assistance Lower Body Dressing Details (indicate cue type and reason): Pt assisting with donning socks             Functional  mobility during ADLs: Minimal assistance;+2 for physical assistance;Rolling walker General ADL Comments: focus of session on increasing mobility distance and discussing progress adn achieveing goals. did not address use of briefs at this time due to strong emotional reaciton last visit to idea of using BSC. Will discuss attempting use of comfort height toilet instead of BSC in order to reach goal of independence again. sister present for latter part of session.      Vision                 Additional Comments: wears glasses   Perception     Praxis      Cognition   Behavior During Therapy: WFL for tasks assessed/performed Overall Cognitive Status: Within Functional Limits for tasks assessed                       Extremity/Trunk Assessment   Limited B shoulder ROM, L shoulder (@ 90FF) ROM better than R (80 FF). Would benefit from scapular mobilization and B shoulder/UE strengthening as tolerated. AFter minimal ex. Pt states "I'm getting winded".            Exercises General Exercises - Upper Extremity Shoulder Flexion: Both;5 reps;AROM;Seated   Shoulder Instructions       General Comments      Pertinent Vitals/ Pain       Pain Assessment: 0-10 Pain Score: 4  Pain Location: back Pain Descriptors / Indicators: Aching;Discomfort;Radiating Pain Intervention(s): Limited activity within patient's tolerance;Repositioned  Home Living                                          Prior Functioning/Environment              Frequency Min 3X/week     Progress Toward Goals  OT Goals(current goals can now be found in the care plan section)  Progress towards OT goals: Progressing toward goals  Acute Rehab OT Goals Patient Stated Goal: to get out of the hospital, decrase pain.  OT Goal Formulation: With patient Time For Goal Achievement: 05/18/16 Potential to Achieve Goals: Good ADL Goals Pt Will Perform Lower Body Bathing: with min  assist;with adaptive equipment;sit to/from stand Pt Will Transfer to Toilet: bedside commode;with min guard assist;stand pivot transfer Pt Will Perform Toileting - Clothing Manipulation and hygiene: with min assist;sit to/from stand Pt/caregiver will Perform Home Exercise Program: Both right and left upper extremity;With written HEP provided;With Supervision Additional ADL Goal #1: Pt will complete meaningful activity with minimal encouragement Additional ADL Goal #2: Pt will sit unsupported for 5 minutes at min guard (A) level as precursor to adls. Additional ADL Goal #3: Pt will demonstrate sit <>Stand with total +2 Mod (A)  Additional ADL Goal #4: Complete bed mobility with S for ADL  Plan Discharge plan remains appropriate    Co-evaluation    PT/OT/SLP Co-Evaluation/Treatment: Yes Reason for Co-Treatment: Complexity of the patient's impairments (multi-system involvement);Necessary to address cognition/behavior during functional activity;For patient/therapist safety PT goals addressed during session: Mobility/safety with mobility;Balance;Proper use of DME OT goals addressed  during session: ADL's and self-care;Strengthening/ROM      End of Session Equipment Utilized During Treatment: Gait belt;Rolling walker   Activity Tolerance Patient tolerated treatment well   Patient Left in chair;with call bell/phone within reach;with nursing/sitter in room;with family/visitor present   Nurse Communication Mobility status        Time: 1110-1145 OT Time Calculation (min): 35 min  Charges: OT General Charges $OT Visit: 1 Procedure OT Treatments $Self Care/Home Management : 8-22 mins  Geron Mulford,HILLARY 05/07/2016, 1:54 PM   [facility]ilary Finis Hendricksen, OTR/L  [phone] 05/07/2016

## 2016-05-07 NOTE — Progress Notes (Signed)
Patient ID: [name] Bell, female   DOB: 07-22-1981, 35 y.o.   MRN: [id number]   LOS: 88 days   Subjective: C/o back pain again. Also c/o excess throat mucus causing her to gag, worse when she's lying flat.   Objective: Vital signs in last 24 hours: Temp:  [97.8 F (36.6 C)-98.3 F (36.8 C)] 97.8 F (36.6 C) (06/30 0521) Pulse Rate:  [77-80] 77 (06/30 0521) Resp:  [16-18] 18 (06/30 0521) BP: (143-146)/(92-98) 146/98 mmHg (06/30 0521) SpO2:  [98 %-100 %] 98 % (06/30 0521) Last BM Date: 05/02/16   Physical Exam General appearance: alert and no distress Resp: clear to auscultation bilaterally Cardio: regular rate and rhythm GI: normal findings: bowel sounds normal and soft, non-tender   Assessment/Plan: Jump from hotel/multiple toxic ingestions -Continue on Zoloft, sitter for safety.  S/P ex lap, hepatorraphy, SBR, cholecystectomy, preperitoneal pelvic packing [name] 4/3  Ex lap for acidosis/bleeding 4/4 [place] Ex lap for bleeding 4/5 [name] S/P ex fix pelvis [name] 4/3 S/P ex lap 4/7 [place] S/P ex lap 4/10 [place] S/P sacral screws 4/10 [name] S/P application ABRA, open gastrostomy tube 4/12 [name]  S/P abd closure, trach 4/24 [name] Mult B rib FX/R HPTX, B pulm contusions  Multiple toxic ingestions Mult L wrist FXs - ok to remove splint per Dr. Melvyn Novasrtmann  Pelvic FX - S/P ex fix and angioembolization, S/P sacral screw by Dr. Carola FrostHandy. WBAT BLE. ABL anemia Right empyema s/p thoracotomy -- Per TTS, on Maxipime FEN - Mucus problem could be 2/2 reflux, will increase PPI and see if it helps VTE - SCD's, Lovenox Dispo - [facility]    Freeman CaldronMichael J. Lainee Lehrman, PA-C Pager: [phone] General Trauma PA Pager: [phone]  05/07/2016

## 2016-05-08 NOTE — Progress Notes (Signed)
Patient ID: [name] Bell, female   DOB: February 14, 1981, 35 y.o.   MRN: [id number]   LOS: 89 days   Subjective: Back pain improved   Objective: Vital signs in last 24 hours: Temp:  [98.1 F (36.7 C)-98.2 F (36.8 C)] 98.2 F (36.8 C) (07/01 0645) Pulse Rate:  [79-87] 87 (07/01 0645) Resp:  [18] 18 (07/01 0645) BP: (138)/(90-97) 138/97 mmHg (07/01 0645) SpO2:  [100 %] 100 % (07/01 0645) Weight:  [85.73 kg (189 lb)] 85.73 kg (189 lb) (07/01 0645) Last BM Date: 05/02/16   Physical Exam General appearance: alert and no distress Resp: clear to auscultation bilaterally Cardio: regular rate and rhythm GI: Soft, +BS   Assessment/Plan: Jump from hotel/multiple toxic ingestions -Continue on Zoloft, sitter for safety.  S/P ex lap, hepatorraphy, SBR, cholecystectomy, preperitoneal pelvic packing [name] Bell 4/3  Ex lap for acidosis/bleeding 4/4 [name] Bell Ex lap for bleeding 4/5 [name] Bell S/P ex fix pelvis [name] Bell 4/3 S/P ex lap 4/7 [name] Bell S/P ex lap 4/10 [name] Bell S/P sacral screws 4/10 [name] Bell S/P application ABRA, open gastrostomy tube 4/12 [name] Bell  S/P abd closure, trach 4/24 [name] Bell Mult B rib FX/R HPTX, B pulm contusions  Multiple toxic ingestions Mult L wrist FXs - ok to remove splint per Dr. Melvyn Bell  Pelvic FX - S/P ex fix and angioembolization, S/P sacral screw by Dr. Carola Bell. WBAT BLE. ABL anemia Right empyema s/p thoracotomy -- Per TTS, on Cipro FEN - No issues VTE - SCD's, Lovenox Dispo - [facility]    [name] CaldronMichael J. Brytney Somes, PA-C Pager: [phone] General Trauma PA Pager: [phone]  05/08/2016

## 2016-05-09 NOTE — Progress Notes (Signed)
Patient ID: [name] Bell, female   DOB: 04-Dec-1980, 35 y.o.   MRN: [id number]   LOS: 90 days   Subjective: No new c/o. About to get up with OT.   Objective: Vital signs in last 24 hours: Temp:  [98.1 F (36.7 C)-98.2 F (36.8 C)] 98.2 F (36.8 C) (07/01 2246) Pulse Rate:  [83-90] 90 (07/02 0550) Resp:  [18] 18 (07/01 1512) BP: (119-135)/(75-98) 135/98 mmHg (07/02 0550) SpO2:  [100 %] 100 % (07/02 0550) Last BM Date: 05/02/16   Physical Exam General appearance: alert and no distress Resp: clear to auscultation bilaterally   Assessment/Plan: Jump from hotel/multiple toxic ingestions -Continue on Zoloft, sitter for safety.  S/P ex lap, hepatorraphy, SBR, cholecystectomy, preperitoneal pelvic packing [name] 4/3  Ex lap for acidosis/bleeding 4/4 Thompson Ex lap for bleeding 4/5 [name] S/P ex fix pelvis [name] 4/3 S/P ex lap 4/7 Thompson S/P ex lap 4/10 Thompson S/P sacral screws 4/10 [name] S/P application ABRA, open gastrostomy tube 4/12 [name]  S/P abd closure, trach 4/24 [name] Mult B rib FX/R HPTX, B pulm contusions  Multiple toxic ingestions Mult L wrist FXs - ok to remove splint per Dr. Melvyn Novasrtmann  Pelvic FX - S/P ex fix and angioembolization, S/P sacral screw by Dr. Carola FrostHandy. WBAT BLE. ABL anemia Right empyema s/p thoracotomy -- Per TTS, on Cipro FEN - No issues VTE - SCD's, Lovenox Dispo - [facility]    Freeman CaldronMichael J. Dania Marsan, PA-C Pager: [phone] General Trauma PA Pager: [phone]  05/09/2016

## 2016-05-09 NOTE — Progress Notes (Signed)
Occupational Therapy Treatment Patient Details Name: [name] BobKatia D Bell MRN: [id number] DOB: Jan 20, 1981 Today's Date: 05/09/2016    History of present illness [name] BobKatia D Bell is a 35 y.o. [facility] s/p attempted suicide: taped DNR note to chest, drank Drano, then jumped off 5 story building ([address]S/p 02/09/16 gel foam embolization of bil internal iliac arteries.S/p 02/09/16 ex lap with cholecystectomy, resection SB and mesenteric repair, liver lac repair, application or wound vac.S/p 02/09/16 closed reduction of pelvic ring, external pelvic fixation.Also has right metacarpal fracture in splint. Pt on and off intubation via trach/ and on/off trac collar.  Pelvic x- fixator pins removed at bedside on 03/23/16.  Underwent thoracotomy 6/19 due to empyema/abcess with 2 chest tubes, one d/c 04/30/16, second d/c 05/05/16 and pt no longer has trach.  Pt with no significant PMHx listed in the chart.    OT comments  Pt continues to demonstrate a brighter affect. Good eye contact during session. Pt talked about how much she enjoyed her visit with her friends from out of town. Discussed goal of session being walking into bathroom and sitting on toilet to reach goal of not using briefs any longer. Pt agreeable to task. With standing initially,pt complained of being dizzy and feeling "faint". Systolic BP 100; Previously 119 per doc flow. Pt stated that she didn't feel it was anxiety about using the toilet. Instead, pt got to chair and wrote down her goals that she wanted to achieve. Discussed how far she had come and how using her writen goals would help her see her progression to achieve her "big goal of getting out of the hospital". Encouraged pt to get OOB several times a day rather than once a day and to walk later in the day if able. Pt appreciative of visit. Will continue to follow acutely. Feel pt would benefit from rehab at CIR.   Follow Up Recommendations  CIR;Supervision/Assistance -  24 hour    Equipment Recommendations  3 in 1 bedside comode    Recommendations for Other Services Rehab consult    Precautions / Restrictions Precautions Precautions: Fall Restrictions RUE Weight Bearing: Weight bearing as tolerated LUE Weight Bearing: Weight bearing as tolerated RLE Weight Bearing: Weight bearing as tolerated LLE Weight Bearing: Weight bearing as tolerated       Mobility Bed Mobility Overal bed mobility: Needs Assistance Bed Mobility: Supine to Sit Rolling: Supervision   Supine to sit: HOB elevated;Supervision     General bed mobility comments: heavy use of rail  Transfers Overall transfer level: Needs assistance Equipment used: Rolling walker (2 wheeled) Transfers: Sit to/from [employer]Stand;Stand Pivot Transfers Sit to Stand: Min assist;From elevated surface Stand pivot transfers: Min assist            Balance     Sitting balance-[name]: Good       Standing balance-[name]: Poor                     ADL Overall ADL's : Needs assistance/impaired                     Lower Body Dressing: Set up;Bed level Lower Body Dressing Details (indicate cue type and reason): Pt donned socks in bed with set up             Functional mobility during ADLs: Minimal assistance;Rolling walker General ADL Comments: Cues for hand placement and scooting to EOB to make transition easier. Became nauseated and  felt "lightheaded" when standing. Goal was to walk to toilet, however, pt only comleted bed chair transfers this am due to not feeling well. BP lower than normal 100/70. Discussed importnace of moving to revent feelings of dizziness with mobility. Pt established goals today in her notebook.       Vision                 Additional Comments: wears glasses   Perception     Praxis      Cognition   Behavior During Therapy: WFL for tasks assessed/performed Overall Cognitive Status: Within Functional Limits for tasks  assessed                       Extremity/Trunk Assessment               Exercises Other Exercises Other Exercises: shoulder circles forward and backwards Other Exercises: BUE shoulder FF in sitting to 90 5 reps Other Exercises: encourgaed use of incentive spirometer Other Exercises: educated on use of theraputty to strengthen hand intrinsics - yellow putty   Shoulder Instructions       General Comments      Pertinent Vitals/ Pain       Pain Assessment: 0-10 Pain Score: 6  Pain Location: stomach Pain Descriptors / Indicators: Burning;Cramping;Crying Pain Intervention(s): Limited activity within patient's tolerance  Home Living                                          Prior Functioning/Environment              Frequency Min 3X/week     Progress Toward Goals  OT Goals(current goals can now be found in the care plan section)  Progress towards OT goals: Progressing toward goals  Acute Rehab OT Goals Patient Stated Goal: to get out of the hospital, decrase pain.  OT Goal Formulation: With patient Time For Goal Achievement: 05/18/16 Potential to Achieve Goals: Good ADL Goals Pt Will Perform Lower Body Bathing: with min assist;with adaptive equipment;sit to/from stand Pt Will Transfer to Toilet: bedside commode;with min guard assist;stand pivot transfer Pt Will Perform Toileting - Clothing Manipulation and hygiene: with min assist;sit to/from stand Pt/caregiver will Perform Home Exercise Program:  (and theraputty - yellow; Pt will increase FF to 120 B AAROM) Additional ADL Goal #1: Pt will complete meaningful activity with minimal encouragement Additional ADL Goal #2: Pt will sit unsupported for 5 minutes at min guard (A) level as precursor to adls. Additional ADL Goal #3: Pt will demonstrate sit <>Stand with total +2 Mod (A)  Additional ADL Goal #4: Complete bed mobility with S for ADL  Plan Discharge plan remains appropriate     Co-evaluation                 End of Session Equipment Utilized During Treatment: Gait belt;Rolling walker   Activity Tolerance Other (comment) (Limited by complaints of dizziness)   Patient Left in chair;with call bell/phone within reach;with nursing/sitter in room   Nurse Communication Mobility status        Time: [id number] OT Time Calculation (min): 30 min  Charges: OT General Charges $OT Visit: 1 Procedure OT Treatments $Self Care/Home Management : 23-37 mins  Brigette Hopfer,HILLARY 05/09/2016, 10:28 AM   Luisa DagoHilary Tatsuya Okray, OTR/L  [phone] 05/09/2016

## 2016-05-10 ENCOUNTER — Inpatient Hospital Stay (HOSPITAL_COMMUNITY): Payer: Medicaid Other

## 2016-05-10 LAB — CBC
HEMATOCRIT: 28.8 % — AB (ref 36.0–46.0)
HEMOGLOBIN: 8.8 g/dL — AB (ref 12.0–15.0)
MCH: 27.4 pg (ref 26.0–34.0)
MCHC: 30.6 g/dL (ref 30.0–36.0)
MCV: 89.7 fL (ref 78.0–100.0)
Platelets: 250 10*3/uL (ref 150–400)
RBC: 3.21 MIL/uL — ABNORMAL LOW (ref 3.87–5.11)
RDW: 15.5 % (ref 11.5–15.5)
WBC: 13.8 10*3/uL — AB (ref 4.0–10.5)

## 2016-05-10 MED ORDER — SULFAMETHOXAZOLE-TRIMETHOPRIM 800-160 MG PO TABS
1.0000 | ORAL_TABLET | Freq: Two times a day (BID) | ORAL | Status: DC
  Administered 2016-05-10 – 2016-05-20 (×19): 1 via ORAL
  Filled 2016-05-10 (×22): qty 1

## 2016-05-10 MED ORDER — LORAZEPAM 2 MG/ML IJ SOLN
2.0000 mg | Freq: Once | INTRAMUSCULAR | Status: AC
  Administered 2016-05-10: 2 mg via INTRAVENOUS
  Filled 2016-05-10 (×2): qty 1

## 2016-05-10 MED ORDER — STROKE: EARLY STAGES OF RECOVERY BOOK
Freq: Once | Status: AC
  Administered 2016-05-11: 09:00:00
  Filled 2016-05-10: qty 1

## 2016-05-10 NOTE — Evaluation (Signed)
Occupational Therapy Evaluation Patient Details Name: [name] BobKatia D Corredor MRN: [id number] DOB: 1981-07-09 Today's Date: 05/10/2016    History of Present Illness [name] Bell is a 35 y.o. [facility] s/p attempted suicide: taped DNR note to chest, drank Drano, then jumped off 5 [address] building ([address]S/p 02/09/16 gel foam embolization of bil internal iliac arteries.S/p 02/09/16 ex lap with cholecystectomy, resection SB and mesenteric repair, liver lac repair, application or wound vac.S/p 02/09/16 closed reduction of pelvic ring, external pelvic fixation.Also has right metacarpal fracture in splint. Pt on and off intubation via trach/ and on/off trac collar.  Pelvic x- fixator pins removed at bedside on 03/23/16.  Underwent thoracotomy 6/19 due to empyema/abcess with 2 chest tubes, one d/c 04/30/16, second d/c 05/05/16 and pt no longer has trach.  Pt with no significant PMHx listed in the chart.    Clinical Impression   Pt with impaired communication today, speaking in 1-2 word phrases. No active movement on L side with pt reporting L UE feeling cold. Complaining of cramping in R LE. Pt requiring total assist for bed mobility. Heavy L side lean in sitting. MD assessed pt while OT in room and ordered stat CT scan.    Follow Up Recommendations  CIR;Supervision/Assistance - 24 hour    Equipment Recommendations  3 in 1 bedside comode    Recommendations for Other Services       Precautions / Restrictions Precautions Precautions: Fall Restrictions Weight Bearing Restrictions: Yes RUE Weight Bearing: Weight bearing as tolerated LUE Weight Bearing: Weight bearing as tolerated RLE Weight Bearing: Weight bearing as tolerated LLE Weight Bearing: Weight bearing as tolerated      Mobility Bed Mobility Overal bed mobility: +2 for physical assistance;Needs Assistance Bed Mobility: Rolling;Supine to Sit;Sit to Supine Rolling: Total assist   Supine to sit: Total  assist Sit to supine: Total assist   General bed mobility comments: pt not assisting  Transfers                      Balance Overall balance assessment: Needs assistance Sitting-balance support: Feet supported Sitting balance-[name]: Zero Sitting balance - Comments: heavy L side lean                                    ADL Overall ADL's : Needs assistance/impaired         Upper Body Bathing: Total assistance;Sitting;Bed level   Lower Body Bathing: Total assistance;+2 for physical assistance;Bed level   Upper Body Dressing : Total assistance;Bed level           Toileting- Clothing Manipulation and Hygiene: Total assistance;+2 for physical assistance;Bed level Toileting - Clothing Manipulation Details (indicate cue type and reason): aware she needed to be cleaned       General ADL Comments: Pt sat EOB with total assist, heavy lean to L side x 5 minutes     Vision     Perception     Praxis      Pertinent Vitals/Pain Pain Assessment: Faces Faces Pain Scale: Hurts even more Pain Location: R LE cramping, neck, head Pain Descriptors / Indicators: Aching;Grimacing;Guarding;Crying Pain Intervention(s): Monitored during session;Premedicated before session;Repositioned;Limited activity within patient's tolerance     Hand Dominance     Extremity/Trunk Assessment             Communication     Cognition Arousal/Alertness: Awake/alert Behavior  During Therapy: Flat affect;Agitated (crying at times, foul language) Overall Cognitive Status: Difficult to assess Area of Impairment: Following commands;Problem solving       Following Commands: Follows one step commands with increased time (and multimodal cues)     Problem Solving: Decreased initiation;Difficulty sequencing;Requires verbal cues;Requires tactile cues     General Comments       Exercises   Other Exercises Other Exercises: flaccid L side, reports arm feeling cold    Shoulder Instructions      Home Living                                          Prior Functioning/Environment               OT Diagnosis:     OT Problem List:     OT Treatment/Interventions:      OT Goals(Current goals can be found in the care plan section) Acute Rehab OT Goals Patient Stated Goal: did not state Time For Goal Achievement: 05/18/16 Potential to Achieve Goals: Good  OT Frequency: Min 3X/week   Barriers to D/C:            Co-evaluation              End of Session    Activity Tolerance: Patient limited by pain;Treatment limited secondary to agitation Patient left: in bed;with call bell/phone within reach;with nursing/sitter in room (going to CT scan)   Time: [id number] OT Time Calculation (min): 43 min Charges:  OT General Charges $OT Visit: 1 Procedure OT Treatments $Self Care/Home Management : 38-52 mins G-Codes:    Evern BioMayberry, Preet Mangano Lynn 05/10/2016, 12:25 PM [phone]

## 2016-05-10 NOTE — Progress Notes (Signed)
PT Cancellation Note  Patient Details Name: [name] Bell MRN: [id number] DOB: 02-10-1981   Cancelled Treatment:    Reason Eval/Treat Not Completed: Medical issues which prohibited therapy.  Sudden changes in pt's condition.  CT seems to suggest possible stroke.  MR pending.  PT will hold for today and check back for stability tomorrow to continue to mobilize.  Thanks,    Rollene Rotundaebecca B. Numan Zylstra, PT, DPT [phone]   05/10/2016, 1:34 PM

## 2016-05-10 NOTE — Progress Notes (Signed)
Spoke with patient this morning who is agreeable to perform PT tx at 11:00 am.   Harriet Sutphen,Joycelyn Rua PTA pager [phone]
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
## 2016-05-10 NOTE — Progress Notes (Signed)
Pt awakened at 0600 complaining of muscle cramping in right leg. She was repositioned and offered robaxin (that she had refused at 2200) and pain pills. She was crying and acting like a child, speaking in one word sentences. She was able to follow directions and was able to move all extremities. She asked for a massage to her right leg and was able to swallow one oxycodone pill and one robaxin. Her behavior improved as she interacted more with the staff.

## 2016-05-10 NOTE — Progress Notes (Addendum)
Dr. Roxy Mannsster made aware that pt refused MRI at this time. She stated that the bed for the MRI caused severe muscle spasms, and she would not re-attempt.   [name] Bell    Pt passed stroke swallow screen-regular diet ordered per Dr. Lindie SpruceWyatt.

## 2016-05-10 NOTE — Progress Notes (Signed)
14 Days Post-Op Procedure(s) (LRB): Right THORACOTOMY AND DRAINAGE OF EMPYEMA (Right) Subjective:  No specific complaints but not conversant  Objective: Vital signs in last 24 hours: Temp:  [98.4 F (36.9 C)-98.5 F (36.9 C)] 98.5 F (36.9 C) (07/02 1924) Pulse Rate:  [90-106] 91 (07/03 0336) Cardiac Rhythm:  [-]  BP: (100-131)/(62-85) 129/84 mmHg (07/03 0336) SpO2:  [98 %-100 %] 98 % (07/03 0336)  Hemodynamic parameters for last 24 hours:    Intake/Output from previous day: 07/02 0701 - 07/03 0700 In: 360 [P.O.:360] Out: -  Intake/Output this shift:    Heart: regular rate and rhythm, S1, S2 normal, no murmur, click, rub or gallop Lungs: clear to auscultation bilaterally Wound: right thoracotomy incision intact with every other staple removed.  Chest wall wound packed.  Lab Results: No results for input(s): WBC, HGB, HCT, PLT in the last 72 hours. BMET: No results for input(s): NA, K, CL, CO2, GLUCOSE, BUN, CREATININE, CALCIUM in the last 72 hours.  PT/INR: No results for input(s): LABPROT, INR in the last 72 hours. ABG    Component Value Date/Time   PHART 7.508* 02/28/2016 0642   HCO3 25.5* 02/28/2016 0642   TCO2 26.5 02/28/2016 0642   ACIDBASEDEF 2.0 02/13/2016 0411   O2SAT 99.2 02/28/2016 0642   CBG (last 3)  No results for input(s): GLUCAP in the last 72 hours.  Assessment/Plan: S/P Procedure(s) (LRB): Right THORACOTOMY AND DRAINAGE OF EMPYEMA (Right)  CXR from 6/29 looked stable with good aeration of the right lung, no significant effusion. There is pleural thickening which will gradually resolve. Would continue wet to dry dressings to the chest wound until healed. Remove remainder of staples from chest incision tomorrow.   LOS: 91 days    Alleen BorneBryan K Kamrin Spath 05/10/2016

## 2016-05-10 NOTE — Progress Notes (Signed)
Pt became uncomfortable towards end of brain mri was talking to pt ; had her talked into continuing to do her c spine - and her sister decided she would be better suited to finish on Tuesday .... i will say she probably wouldn't have completed her brain mri with out her however.

## 2016-05-11 ENCOUNTER — Encounter (HOSPITAL_COMMUNITY): Payer: Self-pay

## 2016-05-11 ENCOUNTER — Inpatient Hospital Stay (HOSPITAL_COMMUNITY): Payer: Medicaid Other

## 2016-05-11 ENCOUNTER — Other Ambulatory Visit (HOSPITAL_COMMUNITY): Payer: Self-pay

## 2016-05-11 DIAGNOSIS — F332 Major depressive disorder, recurrent severe without psychotic features: Secondary | ICD-10-CM

## 2016-05-11 DIAGNOSIS — E785 Hyperlipidemia, unspecified: Secondary | ICD-10-CM

## 2016-05-11 DIAGNOSIS — I6340 Cerebral infarction due to embolism of unspecified cerebral artery: Secondary | ICD-10-CM | POA: Insufficient documentation

## 2016-05-11 DIAGNOSIS — I63421 Cerebral infarction due to embolism of right anterior cerebral artery: Secondary | ICD-10-CM

## 2016-05-11 DIAGNOSIS — W139XXA Fall from, out of or through building, not otherwise specified, initial encounter: Secondary | ICD-10-CM

## 2016-05-11 LAB — CBC
HEMATOCRIT: 28 % — AB (ref 36.0–46.0)
Hemoglobin: 8.6 g/dL — ABNORMAL LOW (ref 12.0–15.0)
MCH: 27.4 pg (ref 26.0–34.0)
MCHC: 30.7 g/dL (ref 30.0–36.0)
MCV: 89.2 fL (ref 78.0–100.0)
PLATELETS: 248 10*3/uL (ref 150–400)
RBC: 3.14 MIL/uL — ABNORMAL LOW (ref 3.87–5.11)
RDW: 15.4 % (ref 11.5–15.5)
WBC: 9.9 10*3/uL (ref 4.0–10.5)

## 2016-05-11 LAB — BASIC METABOLIC PANEL
ANION GAP: 9 (ref 5–15)
BUN: 18 mg/dL (ref 6–20)
CO2: 23 mmol/L (ref 22–32)
Calcium: 9.4 mg/dL (ref 8.9–10.3)
Chloride: 102 mmol/L (ref 101–111)
Creatinine, Ser: 1.46 mg/dL — ABNORMAL HIGH (ref 0.44–1.00)
GFR, EST AFRICAN AMERICAN: 54 mL/min — AB (ref >60)
GFR, EST NON AFRICAN AMERICAN: 47 mL/min — AB (ref >60)
GLUCOSE: 105 mg/dL — AB (ref 65–99)
POTASSIUM: 4.4 mmol/L (ref 3.5–5.1)
SODIUM: 134 mmol/L — AB (ref 135–145)

## 2016-05-11 LAB — LIPID PANEL
CHOL/HDL RATIO: 3.9 ratio
Cholesterol: 180 mg/dL (ref 0–200)
HDL: 46 mg/dL (ref >40)
LDL CALC: 112 mg/dL — AB (ref 0–99)
TRIGLYCERIDES: 108 mg/dL (ref <150)
VLDL: 22 mg/dL (ref 0–40)

## 2016-05-11 MED ORDER — IOPAMIDOL (ISOVUE-300) INJECTION 61%
INTRAVENOUS | Status: AC
  Filled 2016-05-11: qty 100

## 2016-05-11 MED ORDER — ATORVASTATIN CALCIUM 20 MG PO TABS
20.0000 mg | ORAL_TABLET | Freq: Every day | ORAL | Status: DC
  Administered 2016-05-11 – 2016-06-02 (×22): 20 mg via ORAL
  Filled 2016-05-11 (×23): qty 1

## 2016-05-11 MED ORDER — ASPIRIN EC 325 MG PO TBEC
325.0000 mg | DELAYED_RELEASE_TABLET | Freq: Every day | ORAL | Status: DC
  Administered 2016-05-11 – 2016-05-12 (×2): 325 mg via ORAL
  Filled 2016-05-11 (×2): qty 1

## 2016-05-11 MED ORDER — IOPAMIDOL (ISOVUE-370) INJECTION 76%
INTRAVENOUS | Status: AC
  Administered 2016-05-11: 50 mL
  Filled 2016-05-11: qty 50

## 2016-05-11 NOTE — Progress Notes (Signed)
Physical Therapy Treatment Patient Details Name: [name] Bell MRN: [id number] DOB: [birth year] Today's Date: 05/11/2016    History of Present Illness [name] Bell is a 35 y.o. female [facility]16 s/p attempted suicide: taped DNR note to chest, drank Drano, then jumped off 5 story building ([address]).S/p 02/09/16 gel foam embolization of bil internal iliac arteries.S/p 02/09/16 ex lap with cholecystectomy, resection SB and mesenteric repair, liver lac repair, application or wound vac.S/p 02/09/16 closed reduction of pelvic ring, external pelvic fixation.Also has right metacarpal fracture in splint. Pt on and off intubation via trach/ and on/off trac collar.  Pelvic x- fixator pins removed at bedside on 03/23/16.  Underwent thoracotomy 6/19 due to empyema/abcess with 2 chest tubes, one d/c 04/30/16, second d/c 05/05/16 and pt no longer has trach.  Pt with no significant PMHx listed in the chart. Pt had acute speech and left sided weakness changes on7/3/17 and both CT and MRI confirmed stroke (R frontoparietal).      PT Comments    PTA assisted NT/sittter to transfer patient back to bed.  Pt reports feeling uncomfortable in seated position and requesting back to bed.  CNA reports lack of knowledge in lift equipment so PTA assisted and educated staff on use of equipment.    Follow Up Recommendations  CIR     Equipment Recommendations  Wheelchair (measurements PT);Wheelchair cushion (measurements PT);Hospital bed;Other (comment);3in1 (PT);Rolling walker with 5" wheels    Recommendations for Other Services Rehab consult     Precautions / Restrictions Precautions Precautions: Fall Precaution Comments: left hemiparesis Restrictions Weight Bearing Restrictions: Yes RUE Weight Bearing: Weight bearing as tolerated LUE Weight Bearing: Weight bearing as tolerated RLE Weight Bearing: Weight bearing as tolerated LLE Weight Bearing: Weight bearing as tolerated Other  Position/Activity Restrictions: WBAT for gait training beginning 04/30/16    Mobility  Bed Mobility  Overal bed mobility: + 2 for safety/equipment;Needs Assistance Bed Mobility: Supine to Sit Rolling: +2 for physical assistance;Max assist (with bed pad to remove max sky sling.     General bed mobility comments: Assisted patinet in positioning on L side with pillow under hips and between knees.  Rolling performed to remove maxisky pad and +2 assist to boost pt in supine.  Transfers Overall transfer level: Needs assistance               General transfer comment: Transferred patient from chair to bed using maxisky.  NT/sitter stopped therapist in hallway requesting assist to use lift equipment.  PTA instructed and assisted patient in correct use and transfer back using maxisky lift equipment.  Pt uncomfortable in sling and PTA moved quickly to advance pt back to bed.    Ambulation/Gait                 Stairs            Wheelchair Mobility    Modified Rankin (Stroke Patients Only) Modified Rankin (Stroke Patients Only) Pre-Morbid Rankin Score: Moderately severe disability Modified Rankin: Severe disability     Balance Overall balance assessment: Needs assistance Sitting-balance support: Feet supported;Single extremity supported Sitting balance-[name]: Zero Sitting balance - Comments: posterior mostly, with slight left lean adjusting at times due to right leg or tailbone pain.  Pt using right arm weakly to try to readjust her position.   Postural control: Posterior lean;Left lateral lean  Cognition Arousal/Alertness: Lethargic Behavior During Therapy: Flat affect;Anxious Overall Cognitive Status: Impaired/Different from baseline Area of Impairment: Following commands;Problem solving;Attention   Current Attention Level: Sustained (with cues)   Following Commands: Follows one step commands with increased time      Problem Solving: Slow processing;Decreased initiation General Comments: Pt lethargic, eyes closed much of the session, cues to re-focus to tasks.  Very distracted by pain multiple locations, easily irritated by questions and priximity of therapist to patient while guarding.     Exercises General Exercises - Lower Extremity Ankle Circles/Pumps: AAROM;PROM;Both;10 reps Heel Slides: AAROM;PROM;Right;Left;10 reps Other Exercises Other Exercises: Painful right leg with end range heel slides    General Comments General comments (skin integrity, edema, etc.): Pt with spotty sensation in left leg and arm, but is able to with 75% accuracy pinpoint light touch.  She has 0/5 strength left leg and arm, unable to activate left upper trap to shrug.        Pertinent Vitals/Pain Pain Assessment: 0-10 Pain Score: 10-Worst pain ever Faces Pain Scale: Hurts whole lot Pain Location: tail bone requesting back to bed.   Pain Descriptors / Indicators: Grimacing;Guarding;Discomfort Pain Intervention(s): Monitored during session;Repositioned    Home Living                      Prior Function            PT Goals (current goals can now be found in the care plan section) Acute Rehab PT Goals Patient Stated Goal: to get to the chair PT Goal Formulation: With patient Time For Goal Achievement: 05/25/16 Potential to Achieve Goals: Good Progress towards PT goals: Progressing toward goals    Frequency  Min 5X/week    PT Plan Other (comment) (re-eval completed)    Co-evaluation             End of Session Equipment Utilized During Treatment:  (maxisky sling and lift.  ) Activity Tolerance: Patient limited by pain;Patient limited by fatigue Patient left: in chair;with call bell/phone within reach;with nursing/sitter in room     Time: [id number] PT Time Calculation (min) (ACUTE ONLY): 15 min  Charges:  $Therapeutic Activity: 8-22 mins                    G Codes:      [name] Bell 05/11/2016, 5:07 PM  [name] Bell, PTA pager [phone]

## 2016-05-11 NOTE — Progress Notes (Signed)
PT Cancellation Note  Patient Details Name: [name] Bell MRN: [id number] DOB: 27-Sep-1981   Cancelled Treatment:    Reason Eval/Treat Not Completed: Other (comment).  PT checked in with pt at 10 am and pt prefers to work with PT later today at ~ 1500.  PT to check back later to mobilize EOB and or OOB to chair (this was pt's stated goal for today).    Thanks,    Rollene Rotundaebecca B. Leon Goodnow, PT, DPT [phone]   05/11/2016, 10:47 AM

## 2016-05-11 NOTE — Progress Notes (Signed)
STROKE TEAM PROGRESS NOTE   HISTORY OF PRESENT ILLNESS (per record) [name] Bell is an 35 y.o. female who was admitted 3 months ago after she drank Drano/ wrote suicide note and jumped out of a 5th floor hotel window. She was making some strides while in the hospital but yesterday 05/09/2016 was noted to have less verbal output and speech difficulty. CT head obtained and showed a low density in the right frontal lobe with concern for CVA. On consultation she is speaking in 1-3 word sentences with inconsistent word substitution and inconstant ability to articulate. She is not moving her left arm or leg but makes no attempt to move her legs or arms. Neurology was consulted for concern for CVA. Her last known well was 05/09/2016, tim unable to be determined. Patient was not administered IV t-PA secondary to trauma and unknown LKW.    SUBJECTIVE (INTERVAL HISTORY) Her RN is at the bedside.  She still has left hemiplegia. Her speech as per nurse is slower than before. She is orientated and answered all questions with bradyphasia. CTA head and neck done but TCD bubble study and LE venous doppler has to be done tomorrow.   OBJECTIVE Temp:  [97.6 F (36.4 C)-99.2 F (37.3 C)] 97.6 F (36.4 C) (07/04 0406) Pulse Rate:  [81-91] 85 (07/04 0406) Cardiac Rhythm:  [-] Normal sinus rhythm (07/04 0700) Resp:  [16-19] 16 (07/04 0406) BP: (124-131)/(63-84) 124/82 mmHg (07/04 0406) SpO2:  [98 %-100 %] 98 % (07/04 0406) Weight:  [85.775 kg (189 lb 1.6 oz)] 85.775 kg (189 lb 1.6 oz) (07/04 0406)  CBC:  Recent Labs Lab 05/10/16 1143 05/11/16 1108  WBC 13.8* 9.9  HGB 8.8* 8.6*  HCT 28.8* 28.0*  MCV 89.7 89.2  PLT 250 248    Basic Metabolic Panel:  Recent Labs Lab 05/11/16 1108  NA 134*  K 4.4  CL 102  CO2 23  GLUCOSE 105*  BUN 18  CREATININE 1.46*  CALCIUM 9.4    Lipid Panel:    Component Value Date/Time   CHOL 180 05/11/2016 0518   TRIG 108 05/11/2016 0518   HDL 46 05/11/2016 0518    CHOLHDL 3.9 05/11/2016 0518   VLDL 22 05/11/2016 0518   LDLCALC 112* 05/11/2016 0518   HgbA1c: No results found for: HGBA1C   IMAGING I have personally reviewed the radiological images below and agree with the radiology interpretations.  Dg Chest 2 View 05/10/2016  Enlarging loculated right pleural fluid collection, possibly reaccumulation of an empyema. Probable airspace opacification in the right lung. Electronically Signed   By: Leanna BattlesMelinda  Blietz M.D.   On: 05/10/2016 16:00   Ct Head Wo Contrast 05/10/2016  SUBTLE AREA OF LOW DENSITY AND LOSS OF GRAY-WHITE DIFFERENTIATION IN THE ANTERIOR RIGHT FRONTAL LOBE COMPATIBLE WITH ACUTE INFARCTION.  02/10/2016 No acute intracranial abnormality or acute abnormality of the cervical spine. Sinus disease is likely related to intubation and OG tube placement. The patient's OG tube is looped in the pharynx and mouth.  Mr Brain Wo Contrast 05/10/2016  Subacute to chronic appearing RIGHT frontoparietal lobe infarct in RIGHT MCA distribution with petechial hemorrhage. Findings less likely represent venous infarct. 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   Ct Angio head & Neck W Or Wo Contrast 05/11/2016   Progression of cytotoxic edema compared with the initial CT from 05/10/2016 in the RIGHT frontal lobe. Moderate irregularity of the A3 and A4 segments, anterior cerebral artery on  the RIGHT, correlates with the observed pattern of restricted diffusion on MR; considerations would include vasculitis, intracranial atherosclerosis, or vasospasm. No intracranial large vessel occlusion or extracranial stenosis of significance. No findings to support cortical venous or dural venous thrombosis. Suspected recurrent empyema in the RIGHT chest, 9 x 10 cm cross-section with air-fluid level. Recommend CT chest with contrast for further evaluation.   2D Echocardiogram  02/25/2016 - Left ventricle: The  cavity size was normal. Systolic function was normal. The estimated ejection fraction was in the range of 60% to 65%. Wall motion was normal; there were no regional wall motion abnormalities. Left ventricular diastolic function parameters were normal. - Aortic valve: Trileaflet; mildly thickened leaflets. Valve area (VTI): 1.91 cm^2. Valve area (Vmax): 2.11 cm^2. Valve area (Vmean): 2.14 cm^2. - Right atrium: Mobile linear density seen in the right atrium.  This most likely represents a catheter. There is a bright target off of this that is highly mobile and concerning for possible thrombus or vegetation. This is not seen in all views. Recommend TEE for further evaluation is clinically indicated. - Pulmonary arteries: PA peak pressure: 39 mm Hg (S). Impressions:   The right ventricular systolic pressure was increased consistent with mild pulmonary hypertension.  TCD bubble study - pending  LE venous doppler - pending    PHYSICAL EXAM  Temp:  [97.6 F (36.4 C)-98.4 F (36.9 C)] 97.6 F (36.4 C) (07/04 0406) Pulse Rate:  [81-85] 85 (07/04 0406) Resp:  [16-19] 16 (07/04 0406) BP: (124-131)/(74-84) 124/82 mmHg (07/04 0406) SpO2:  [98 %-100 %] 98 % (07/04 0406) Weight:  [189 lb 1.6 oz (85.775 kg)] 189 lb 1.6 oz (85.775 kg) (07/04 0406)  General - Well nourished, well developed, in no apparent distress.  Ophthalmologic - Fundi not visualized due to noncooperation.  Cardiovascular - Regular rate and rhythm.  Mental Status -  Level of arousal and orientation to time, place, and person were intact. Language including expression, naming, repetition, comprehension was assessed and found intact, however, significant scanning speech and bradyphasia. Fund of Knowledge was assessed and was intact.  Cranial Nerves II - XII - II - Visual field intact OU. III, IV, VI - Extraocular movements intact. V - Facial sensation intact bilaterally except subjective right V2 decreased sensation about 80% of  the left. VII - Facial movement intact bilaterally. VIII - Hearing & vestibular intact bilaterally. X - Palate elevates symmetrically. XI - Chin turning & shoulder shrug intact bilaterally XII - Tongue protrusion intact.  Motor Strength - The patient's strength was 5/5 RUE and RLE, but 0/5 LUE and LLE.  Bulk was normal and fasciculations were absent.   Motor Tone - Muscle tone was assessed at the neck and appendages and was normal.  Reflexes - The patient's reflexes were 1+ in all extremities and she had no pathological reflexes.  Sensory - Light touch, temperature/pinprick were assessed and were subjectively decreased on the right UE and LE, about 90% of the left    Coordination - The patient had normal movements in the right hand with no ataxia or dysmetria.  Tremor was absent.  Gait and Station - not tested due to weakness.   ASSESSMENT/PLAN [name] Bell is a 35 y.o. female admitted 3 mos ago after drinking drano and jumping from a 5th floor window who developed aphasia in the hospital with decreased movements L side. She did not receive IV t-PA due to unknown LKW, recent trauma.   Stroke:  R ACA and MCA/ACA acute  infarct embolic secondary to unknown etiology.  Resultant  Left hemiplegia  MRI  R ACA and MCA/ACA infarct with petechial hemorrhage. Severe diffuse axonal injury.  CTA head & neck  Irregular R ACA A3-A4 segments, no carotid dissection  LE venous Doppler  pending  TCD bubble study pending  2D Echo in 02/2016 showed R atrium bright target, highly mobile, concerning for possible thrombus/vegetation - recommended TEE at that time - will plan for TEE this time.  LDL 112  HgbA1c pending  Hypercoagulable labls pending   Lovenox 30 mg sq daily for VTE prophylaxis  Diet regular Room service appropriate?: Yes; Fluid consistency:: Thin  No antithrombotic prior to admission, now on aspirin 325 mg daily.  Patient counseled to be compliant with her antithrombotic  medications  Ongoing aggressive stroke risk factor management  Therapy recommendations:  CIR  Disposition:  pending   Hyperlipidemia  Home meds:  No statin  LDL 112, goal < 70  Now on lipitor 20  Continue statin at discharge  Other Stroke Risk Factors  Overweight, Body mass index is 26.39 kg/(m^2).  Other Active Problems  Trauma s/p suicidal attempt and fall  Suicidal attempt  R empyema, chest wall wound  Hospital day # 17  Marvel Plan, MD PhD Stroke Neurology 05/11/2016 4:06 PM      To contact Stroke Continuity provider, please refer to [email or web address]. After hours, contact General Neurology

## 2016-05-11 NOTE — Progress Notes (Signed)
      [address]       [place] [id number]             [phone]      15 Days Post-Op Procedure(s) (LRB): Right THORACOTOMY AND DRAINAGE OF EMPYEMA (Right)   Subjective:  No new complaints.  Depressed about stroke.  Did ask to listen to her heart.  Unable to move left side, uses right arm.  Complains of itching on right shoulder  Objective: Vital signs in last 24 hours: Temp:  [97.6 F (36.4 C)-99.2 F (37.3 C)] 97.6 F (36.4 C) (07/04 0406) Pulse Rate:  [81-98] 85 (07/04 0406) Cardiac Rhythm:  [-] Normal sinus rhythm (07/04 0700) Resp:  [16-19] 16 (07/04 0406) BP: (114-131)/(63-84) 124/82 mmHg (07/04 0406) SpO2:  [98 %-100 %] 98 % (07/04 0406) Weight:  [189 lb 1.6 oz (85.775 kg)] 189 lb 1.6 oz (85.775 kg) (07/04 0406)  Intake/Output from previous day: 07/03 0701 - 07/04 0700 In: 240 [P.O.:240] Out: -   General appearance: alert, cooperative and no distress Heart: regular rate and rhythm Lungs: diminished breath sounds bibasilar Abdomen: soft, non-tender; bowel sounds normal; no masses,  no organomegaly Wound: chest wall wound packed, remaining staples removed Neuro: unable to move left side, uses right arm  Lab Results:  Recent Labs  05/10/16 1143  WBC 13.8*  HGB 8.8*  HCT 28.8*  PLT 250   BMET: No results for input(s): NA, K, CL, CO2, GLUCOSE, BUN, CREATININE, CALCIUM in the last 72 hours.  PT/INR: No results for input(s): LABPROT, INR in the last 72 hours. ABG    Component Value Date/Time   PHART 7.508* 02/28/2016 0642   HCO3 25.5* 02/28/2016 0642   TCO2 26.5 02/28/2016 0642   ACIDBASEDEF 2.0 02/13/2016 0411   O2SAT 99.2 02/28/2016 0642   CBG (last 3)  No results for input(s): GLUCAP in the last 72 hours.  Assessment/Plan: S/P Procedure(s) (LRB): Right THORACOTOMY AND DRAINAGE OF EMPYEMA (Right)  1. Pulm- CXR shows enlargement of right sided loculated pleural effusion 2. Wound- continue to pack chest wall wound, staples are  out 3. Neuro- Recent stroke residual paralysis present 4. Dispo- care per trauma, continue wound care   LOS: 92 days    Torey Reinard 05/11/2016

## 2016-05-11 NOTE — Progress Notes (Signed)
15 Days Post-Op  Subjective: Still having difficulty with speech and can not move left arm  Objective: Vital signs in last 24 hours: Temp:  [97.6 F (36.4 C)-99.2 F (37.3 C)] 97.6 F (36.4 C) (07/04 0406) Pulse Rate:  [81-98] 85 (07/04 0406) Resp:  [16-19] 16 (07/04 0406) BP: (114-131)/(63-84) 124/82 mmHg (07/04 0406) SpO2:  [98 %-100 %] 98 % (07/04 0406) Weight:  [85.775 kg (189 lb 1.6 oz)] 85.775 kg (189 lb 1.6 oz) (07/04 0406) Last BM Date: 05/09/16  Intake/Output from previous day: 07/03 0701 - 07/04 0700 In: 240 [P.O.:240] Out: -  Intake/Output this shift:    Resp: clear to auscultation bilaterally Cardio: regular rate and rhythm GI: soft, non-tender; bowel sounds normal; no masses,  no organomegaly Extremities: can not move left arm  Lab Results:   Recent Labs  05/10/16 1143  WBC 13.8*  HGB 8.8*  HCT 28.8*  PLT 250   BMET No results for input(s): NA, K, CL, CO2, GLUCOSE, BUN, CREATININE, CALCIUM in the last 72 hours. PT/INR No results for input(s): LABPROT, INR in the last 72 hours. ABG No results for input(s): PHART, HCO3 in the last 72 hours.  Invalid input(s): PCO2, PO2  Studies/Results: Dg Chest 2 View  05/10/2016  CLINICAL DATA:  Empyema, upper mid chest pain. EXAM: CHEST  2 VIEW COMPARISON:  05/06/2016 and CT chest 04/24/2016. FINDINGS: Trachea is midline. Heart size stable. Loculated right pleural fluid in the right hemi thorax has increased from 05/06/2016. Probable associated airspace opacification in the right lung. Left lung is clear. Multiple right rib fractures are again noted. IMPRESSION: Enlarging loculated right pleural fluid collection, possibly reaccumulation of an empyema. Probable airspace opacification in the right lung. Electronically Signed   By: Leanna BattlesMelinda  Blietz M.D.   On: 05/10/2016 16:00   Ct Head Wo Contrast  05/10/2016  CLINICAL DATA:  Altered mental status. Right temporal headache appear EXAM: CT HEAD WITHOUT CONTRAST TECHNIQUE:  Contiguous axial images were obtained from the base of the skull through the vertex without intravenous contrast. COMPARISON:  02/10/2016 FINDINGS: THERE IS A SUBTLE AREA OF LOW DENSITY AND LOSS OF GRAY-WHITE DIFFERENTIATION WITHIN THE ANTERIOR RIGHT FRONTAL LOBE CONCERNING FOR ACUTE INFARCT. THIS IS NEW SINCE PRIOR STUDY. NO HEMORRHAGE. NO HYDROCEPHALUS. NO MIDLINE SHIFT. NO ACUTE CALVARIAL ABNORMALITY. Visualized paranasal sinuses and mastoids clear. Orbital soft tissues unremarkable. IMPRESSION: SUBTLE AREA OF LOW DENSITY AND LOSS OF GRAY-WHITE DIFFERENTIATION IN THE ANTERIOR RIGHT FRONTAL LOBE COMPATIBLE WITH ACUTE INFARCTION. Electronically Signed   By: Charlett NoseKevin  Dover M.D.   On: 05/10/2016 12:22   Mr Brain Wo Contrast  05/10/2016  CLINICAL DATA:  Suicide attempt 3 months ago, now with speech difficulty and possible RIGHT frontal lobe stroke. EXAM: MRI HEAD WITHOUT CONTRAST TECHNIQUE: Multiplanar, multiecho pulse sequences of the brain and surrounding structures were obtained without intravenous contrast. COMPARISON:  CT HEAD May 10, 2016 at 1213 hours FINDINGS: INTRACRANIAL CONTENTS: Patchy reduced diffusion RIGHT frontal and parietal lobes at the convexities extending to the centrum semiovale with normalized ADC values in a small subcortical component T2 shine through. Patchy susceptibility of RIGHT frontal lobe extending the frontal white matter. Numerous less than 1 cm foci of susceptibility artifact predominately at the gray-white matter junction throughout the cerebrum, as well is in the distribution of the corpus callosum. The ventricles and sulci are normal for patient's age. No suspicious parenchymal signal, masses or mass effect. No abnormal extra-axial fluid collections. No extra-axial masses though, contrast enhanced sequences would be more  sensitive. Normal major intracranial vascular flow voids present at skull base. ORBITS: The included ocular globes and orbital contents are non-suspicious.  SINUSES: Small bilateral mastoid effusions. Minimal sphenoid sinus air-fluid level. SKULL/SOFT TISSUES: No abnormal sellar expansion. No suspicious calvarial bone marrow signal. Craniocervical junction maintained. 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 Findings less likely represent venous infarct. 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 Electronically Signed   By: Awilda Metro M.D.   On: 05/10/2016 22:57    Anti-infectives: Anti-infectives    Start     Dose/Rate Route Frequency Ordered Stop   05/10/16 1230  sulfamethoxazole-trimethoprim (BACTRIM DS,SEPTRA DS) 800-160 MG per tablet 1 tablet     1 tablet Oral Every 12 hours 05/10/16 1122 05/21/16 0959   05/07/16 1300  ciprofloxacin (CIPRO) tablet 500 mg  Status:  Discontinued     500 mg Oral 2 times daily 05/07/16 1200 05/10/16 1122   05/06/16 1700  ceFEPIme (MAXIPIME) 2 g in dextrose 5 % 50 mL IVPB  Status:  Discontinued     2 g 100 mL/hr over 30 Minutes Intravenous Every 8 hours 05/06/16 1316 05/07/16 1200   04/27/16 1000  ceFEPIme (MAXIPIME) 2 g in dextrose 5 % 50 mL IVPB  Status:  Discontinued     2 g 100 mL/hr over 30 Minutes Intravenous Every 12 hours 04/27/16 0818 05/06/16 1316   04/22/16 1200  piperacillin-tazobactam (ZOSYN) IVPB 3.375 g  Status:  Discontinued     3.375 g 12.5 mL/hr over 240 Minutes Intravenous Every 6 hours 04/22/16 1039 04/22/16 1042   04/22/16 1200  vancomycin (VANCOCIN) 1,500 mg in sodium chloride 0.9 % 500 mL IVPB  Status:  Discontinued     1,500 mg 250 mL/hr over 120 Minutes Intravenous Every 12 hours 04/22/16 1133 04/28/16 0115   04/22/16 1130  piperacillin-tazobactam (ZOSYN) IVPB 3.375 g  Status:  Discontinued     3.375 g 12.5 mL/hr over 240 Minutes Intravenous Every 8 hours 04/22/16 1043 04/27/16 0754   04/02/16 1200  fluconazole  (DIFLUCAN) 40 MG/ML suspension 400 mg     400 mg Per Tube  Once 04/02/16 1026 04/02/16 1442   03/31/16 1000  sulfamethoxazole-trimethoprim (BACTRIM,SEPTRA) 200-40 MG/5ML suspension 40 mL  Status:  Discontinued     40 mL Per Tube Every 12 hours 03/31/16 0927 03/31/16 0956   03/31/16 1000  sulfamethoxazole-trimethoprim (BACTRIM,SEPTRA) 200-40 MG/5ML suspension 20 mL     20 mL Per Tube Every 6 hours 03/31/16 0956 04/03/16 2359   03/26/16 1030  cefTRIAXone (ROCEPHIN) 2 g in dextrose 5 % 50 mL IVPB  Status:  Discontinued     2 g 100 mL/hr over 30 Minutes Intravenous Every 24 hours 03/26/16 1023 03/31/16 0921   03/25/16 1000  ceFEPIme (MAXIPIME) 2 g in dextrose 5 % 50 mL IVPB  Status:  Discontinued     2 g 100 mL/hr over 30 Minutes Intravenous Every 12 hours 03/25/16 0829 03/26/16 1023   03/08/16 1400  levofloxacin (LEVAQUIN) IVPB 750 mg     750 mg 100 mL/hr over 90 Minutes Intravenous Every 24 hours 03/08/16 0912 03/10/16 1501   03/06/16 1400  ceFEPIme (MAXIPIME) 2 g in dextrose 5 % 50 mL IVPB  Status:  Discontinued     2 g 100 mL/hr over 30 Minutes Intravenous Every 12 hours 03/06/16 1344 03/08/16 0912   03/01/16 1330  ceFAZolin (ANCEF) IVPB 2g/100 mL premix  2 g 200 mL/hr over 30 Minutes Intravenous To [facility] 02/29/16 0917 03/01/16 1452   02/11/16 0600  piperacillin-tazobactam (ZOSYN) IVPB 3.375 g  Status:  Discontinued     3.375 g 100 mL/hr over 30 Minutes Intravenous 4 times per day 02/10/16 2214 02/22/16 0853   02/10/16 1730  piperacillin-tazobactam (ZOSYN) IVPB 3.375 g  Status:  Discontinued     3.375 g 12.5 mL/hr over 240 Minutes Intravenous 3 times per day 02/10/16 1727 02/10/16 2214      Assessment/Plan: s/p Procedure(s): Right THORACOTOMY AND DRAINAGE OF EMPYEMA (Right) Stroke like symptoms. MRI performed last night. Neurology to review  Jump from hotel/multiple toxic ingestions -Continue on Zoloft, sitter for safety.  S/P ex lap, hepatorraphy, SBR,  cholecystectomy, preperitoneal pelvic packing [name] 4/3  Ex lap for acidosis/bleeding 4/4 Thompson Ex lap for bleeding 4/5 [name] S/P ex fix pelvis [name] 4/3 S/P ex lap 4/7 Thompson S/P ex lap 4/10 Thompson S/P sacral screws 4/10 [name] S/P application ABRA, open gastrostomy tube 4/12 [name]  S/P abd closure, trach 4/24 [name] Mult B rib FX/R HPTX, B pulm contusions  Multiple toxic ingestions Mult L wrist FXs - ok to remove splint per Dr. Melvyn Novasrtmann  Pelvic FX - S/P ex fix and angioembolization, S/P sacral screw by Dr. Carola FrostHandy. WBAT BLE. ABL anemia Right empyema s/p thoracotomy -- Per TTS, on Cipro AMS -- Unsure about behavior, would favor medication reaction though could be volitional. Will stop Robaxin though no similar adverse effects reported. Cipro does have some after-market adverse effects reported so will change to Septra. Protonix, too, has similar after-market effects reported so will stop that as well. No other medications seem worrisome.  FEN - No issues VTE - SCD's, Lovenox Dispo - [facility], [facility]'re requesting CBC and CXR  LOS: 92 days    TOTH III,PAUL S 05/11/2016

## 2016-05-11 NOTE — Progress Notes (Signed)
Physical Therapy Treatment/Re-evaluation Patient Details Name: [name] Bell MRN: [id number] DOB: December 04, 1980 Today's Date: 05/11/2016    History of Present Illness [name] Bell is a 35 y.o. female [facility] s/p attempted suicide: taped DNR note to chest, drank Drano, then jumped off 5 story building ([address]S/p 02/09/16 gel foam embolization of bil internal iliac arteries.S/p 02/09/16 ex lap with cholecystectomy, resection SB and mesenteric repair, liver lac repair, application or wound vac.S/p 02/09/16 closed reduction of pelvic ring, external pelvic fixation.Also has right metacarpal fracture in splint. Pt on and off intubation via trach/ and on/off trac collar.  Pelvic x- fixator pins removed at bedside on 03/23/16.  Underwent thoracotomy 6/19 due to empyema/abcess with 2 chest tubes, one d/c 04/30/16, second d/c 05/05/16 and pt no longer has trach.  Pt with no significant PMHx listed in the chart. Pt had acute speech and left sided weakness changes on7/3/17 and both CT and MRI confirmed stroke (R frontoparietal).      PT Comments    Pt with significantly dense left hemiparesis and decreased sensation in left side.  Right side also seems weaker than before.  Pt is easily frustrated, lethargic and slow to process today.  She complains of pain in her tailbone and right leg with mobility. We had to use the total lift for OOB to chair as pt did not want to attempt standing (which is appropriate given significant assist needed just to sit EOB).  She would benefit from SLP consult to address speech deficits.  PT to follow acutely, goals downgraded based on new weakness.    Follow Up Recommendations  CIR     Equipment Recommendations  Wheelchair (measurements PT);Wheelchair cushion (measurements PT);Hospital bed;Other (comment);3in1 (PT);Rolling walker with 5" wheels    Recommendations for Other Services Rehab consult     Precautions / Restrictions  Precautions Precautions: Fall Precaution Comments: left hemiparesis Restrictions RUE Weight Bearing: Weight bearing as tolerated LUE Weight Bearing: Weight bearing as tolerated RLE Weight Bearing: Weight bearing as tolerated LLE Weight Bearing: Weight bearing as tolerated    Mobility  Bed Mobility Overal bed mobility: + 2 for safety/equipment;Needs Assistance Bed Mobility: Supine to Sit Rolling: +2 for physical assistance;Max assist   Supine to sit: HOB elevated;+2 for physical assistance;Max assist     General bed mobility comments: Assist needed to help pt reach for bed rail, progress left leg over EOB and complete progression of right leg over EOB.  Two person assist at trunk and hips to transition up from left side lying (pt was closer to left side of bed).   Transfers Overall transfer level: Needs assistance               General transfer comment: Transferred pt from sitting EOB using maxisky to recliner chair with help to position for comfort and right lateral lean.  Pt not agreeable, nor appropriate today to try standing in the Stedy standing frame.        Modified Rankin (Stroke Patients Only) Modified Rankin (Stroke Patients Only) Pre-Morbid Rankin Score: Moderately severe disability Modified Rankin: Severe disability     Balance Overall balance assessment: Needs assistance Sitting-balance support: Feet supported;Single extremity supported Sitting balance-[name]: Zero Sitting balance - Comments: posterior mostly, with slight left lean adjusting at times due to right leg or tailbone pain.  Pt using right arm weakly to try to readjust her position.   Postural control: Posterior lean;Left lateral lean  Cognition Arousal/Alertness: Lethargic Behavior During Therapy: Flat affect (easily irritated) Overall Cognitive Status: Impaired/Different from baseline Area of Impairment: Following commands;Problem solving;Attention    Current Attention Level: Sustained (with cues)   Following Commands: Follows one step commands with increased time     Problem Solving: Slow processing;Decreased initiation General Comments: Pt lethargic, eyes closed much of the session, cues to re-focus to tasks.  Very distracted by pain multiple locations, easily irritated by questions and priximity of therapist to patient while guarding.     Exercises General Exercises - Lower Extremity Ankle Circles/Pumps: AAROM;PROM;Both;10 reps Heel Slides: AAROM;PROM;Right;Left;10 reps Other Exercises Other Exercises: Painful right leg with end range heel slides    General Comments General comments (skin integrity, edema, etc.): Pt with spotty sensation in left leg and arm, but is able to with 75% accuracy pinpoint light touch.  She has 0/5 strength left leg and arm, unable to activate left upper trap to shrug.        Pertinent Vitals/Pain Pain Assessment: Faces Faces Pain Scale: Hurts whole lot Pain Location: right leg, right arm IV site, tail bone Pain Descriptors / Indicators: Grimacing;Guarding Pain Intervention(s): Limited activity within patient's tolerance;Monitored during session;Repositioned (RN going in at end of session with pain meds)           PT Goals (current goals can now be found in the care plan section) Acute Rehab PT Goals Patient Stated Goal: to get to the chair PT Goal Formulation: With patient Time For Goal Achievement: 05/25/16 Potential to Achieve Goals: Good Progress towards PT goals: Goals downgraded-see care plan    Frequency  Min 5X/week    PT Plan Other (comment) (re-eval completed)       End of Session   Activity Tolerance: Patient limited by pain;Patient limited by fatigue Patient left: in chair;with call bell/phone within reach;with nursing/sitter in room     Time: 1524-1610 PT Time Calculation (min) (ACUTE ONLY): 46 min  Charges:  $Therapeutic Activity: 23-37 mins            Kylie Simmonds  B. Yandell Mcjunkins, PT, DPT [phone]   05/11/2016, 4:49 PM

## 2016-05-11 NOTE — Progress Notes (Signed)
Attempted several times to contact [facility] in order for them to send a qualified nurse to perform a NIH. However only 1 female nurse was available but the patient cannot have any males in her room. Will make day shift aware that a NIH needs to be performed.

## 2016-05-12 ENCOUNTER — Inpatient Hospital Stay (HOSPITAL_COMMUNITY): Payer: Medicaid Other

## 2016-05-12 DIAGNOSIS — I63421 Cerebral infarction due to embolism of right anterior cerebral artery: Secondary | ICD-10-CM

## 2016-05-12 DIAGNOSIS — Z9889 Other specified postprocedural states: Secondary | ICD-10-CM

## 2016-05-12 DIAGNOSIS — R569 Unspecified convulsions: Secondary | ICD-10-CM

## 2016-05-12 DIAGNOSIS — I639 Cerebral infarction, unspecified: Secondary | ICD-10-CM

## 2016-05-12 DIAGNOSIS — I638 Other cerebral infarction: Secondary | ICD-10-CM

## 2016-05-12 DIAGNOSIS — D62 Acute posthemorrhagic anemia: Secondary | ICD-10-CM

## 2016-05-12 LAB — HOMOCYSTEINE: HOMOCYSTEINE-NORM: 15 umol/L (ref 0.0–15.0)

## 2016-05-12 LAB — HEMOGLOBIN A1C
Hgb A1c MFr Bld: 5.4 % (ref 4.8–5.6)
MEAN PLASMA GLUCOSE: 108 mg/dL

## 2016-05-12 LAB — SICKLE CELL SCREEN: SICKLE CELL SCREEN: NEGATIVE

## 2016-05-12 MED ORDER — SODIUM CHLORIDE 0.9 % IV SOLN
150.0000 mg | INTRAVENOUS | Status: AC
  Administered 2016-05-12: 150 mg via INTRAVENOUS
  Filled 2016-05-12: qty 15

## 2016-05-12 MED ORDER — LORAZEPAM 2 MG/ML IJ SOLN
INTRAMUSCULAR | Status: AC
  Administered 2016-05-12: 2 mg
  Filled 2016-05-12: qty 1

## 2016-05-12 MED ORDER — SODIUM CHLORIDE 0.9 % IV SOLN
1250.0000 mg | Freq: Once | INTRAVENOUS | Status: AC
  Administered 2016-05-12: 1250 mg via INTRAVENOUS
  Filled 2016-05-12: qty 25

## 2016-05-12 MED ORDER — DIAZEPAM 5 MG/ML IJ SOLN
10.0000 mg | Freq: Once | INTRAMUSCULAR | Status: DC
  Filled 2016-05-12: qty 2

## 2016-05-12 NOTE — Clinical Social Work Psych Note (Signed)
Psych CSW made aware of patient's change in medical condition with confirmed CVA. Medical Director updated.  Psych CSW continuing to follow along and assist with disposition.  Disposition: possibly [facility] then [facility]- details to be decided closer to medical readiness  Vickii PennaGina Serai Tukes, LCSW [phone]  [facility] Licensed Clinical Social Worker

## 2016-05-12 NOTE — Progress Notes (Signed)
Patient ID: [name] BobKatia D Bell, female   DOB: [birth year], 35 y.o.   MRN: [id number]   LOS: 93 days   Subjective: Says speech is getting better   Objective: Vital signs in last 24 hours: Temp:  [97.5 F (36.4 C)-98.9 F (37.2 C)] 98.9 F (37.2 C) (07/05 0500) Pulse Rate:  [78-93] 83 (07/05 0500) Resp:  [17-18] 18 (07/05 0500) BP: (97-114)/(59-67) 108/66 mmHg (07/05 0500) SpO2:  [98 %-100 %] 98 % (07/05 0500) Weight:  [84.732 kg (186 lb 12.8 oz)] 84.732 kg (186 lb 12.8 oz) (07/05 0500) Last BM Date: 05/11/16   Physical Exam General appearance: alert and no distress Resp: clear to auscultation bilaterally Cardio: regular rate and rhythm GI: Soft, +BS   Assessment/Plan: Jump from hotel/multiple toxic ingestions -Continue on Zoloft, sitter for safety.  S/P ex lap, hepatorraphy, SBR, cholecystectomy, preperitoneal pelvic packing [name] Bell 4/3  Ex lap for acidosis/bleeding 4/4 [name] Bell Ex lap for bleeding 4/5 [name] Bell S/P ex fix pelvis [name] Bell 4/3 S/P ex lap 4/7 [name] Bell S/P ex lap 4/10 [name] Bell S/P sacral screws 4/10 [name] Bell S/P application ABRA, open gastrostomy tube 4/12 [name] Bell  S/P abd closure, trach 4/24 [name] Bell Mult B rib FX/R HPTX, B pulm contusions  Multiple toxic ingestions Mult L wrist FXs - ok to remove splint per Dr. Melvyn Bell  Pelvic FX - S/P ex fix and angioembolization, S/P sacral screw by Dr. Carola Bell. WBAT BLE. ABL anemia Right empyema s/p thoracotomy -- Per TTS, on Cipro CVA -- Per neurology, for TEE today to evaluate possible right atrial vegetation. Will order ST for language. FEN - No issues VTE - SCD's, Lovenox Dispo - [facility]    [name] CaldronMichael J. Marissa Weaver, PA-C Pager: [phone] General Trauma PA Pager: [phone]  05/12/2016

## 2016-05-12 NOTE — Progress Notes (Signed)
Made 2 calls to Rapid response to perform NIH evaluation on patient. Reported she will be "up on on our floor closer to 0500."

## 2016-05-12 NOTE — Progress Notes (Signed)
Physical Therapy Treatment Patient Details Name: Phylliss BobKatia D Wrinkle MRN: [id number] DOB: June 11, 1981 Today's Date: 05/12/2016    History of Present Illness Phylliss BobKatia D Hildebrant is a 35 y.o. female [facility] s/p attempted suicide: taped DNR note to chest, drank Drano, then jumped off 5 [address]S/p 02/09/16 gel foam embolization of bil internal iliac arteries.S/p 02/09/16 ex lap with cholecystectomy, resection SB and mesenteric repair, liver lac repair, application or wound vac.S/p 02/09/16 closed reduction of pelvic ring, external pelvic fixation.Also has right metacarpal fracture in splint. Pt on and off intubation via trach/ and on/off trac collar.  Pelvic x- fixator pins removed at bedside on 03/23/16.  Underwent thoracotomy 6/19 due to empyema/abcess with 2 chest tubes, one d/c 04/30/16, second d/c 05/05/16 and pt no longer has trach.  Pt with no significant PMHx listed in the chart. Pt had acute speech and left sided weakness changes on7/3/17 and both CT and MRI confirmed stroke (R frontoparietal).      PT Comments    Pt performed increased mobility and progressed to standing in stedy frame.  Pt reports she does not like lift and that stedy felt better.  Pt require mod assist in stedy frame in seated position to maintain balance.  Lateral lean to left noted.    Follow Up Recommendations  CIR     Equipment Recommendations  Wheelchair (measurements PT);Wheelchair cushion (measurements PT);Hospital bed;Other (comment);3in1 (PT);Rolling walker with 5" wheels    Recommendations for Other Services Rehab consult     Precautions / Restrictions Precautions Precautions: Fall Precaution Comments: left hemiparesis Required Braces or Orthoses: Other Brace/Splint Restrictions Weight Bearing Restrictions: Yes RUE Weight Bearing: Weight bearing as tolerated LUE Weight Bearing: Weight bearing as tolerated RLE Weight Bearing: Weight bearing as tolerated LLE Weight Bearing:  Weight bearing as tolerated    Mobility  Bed Mobility Overal bed mobility: +2 for physical assistance Bed Mobility: Supine to Sit     Supine to sit: Max assist;+2 for physical assistance Sit to supine: Max assist;+2 for physical assistance Sit to sidelying: +2 for physical assistance;Max assist General bed mobility comments: Assisted patient to edge of bed. Pt able to move RLE to edge while PTA assisted L side.  pt pulled ion PTA with R UE and PTA/OT assisted pt in trunk control and used pad to complete scooting to edge of bed.  Pt more motivated this session.    Transfers Overall transfer level: Needs assistance Equipment used: None (bed bad to boost hips into standing.  ) Transfers: Sit to/from Stand Sit to Stand: Mod assist;+2 physical assistance Stand pivot transfers:  (used stedy for stand pivot.  )       General transfer comment: Pt performed sit to stand from bed to standing in stedy frame.  Pt required bed pad to boost hips into standing. PTA/OT boosted hip while sitter supported RLE.  Pt demonstrated poor eccentric loading from stand to sit.  Lateral lean to left in frame noted.    Ambulation/Gait                 Stairs            Wheelchair Mobility    Modified Rankin (Stroke Patients Only)       Balance Overall balance assessment: Needs assistance   Sitting balance-[name]: Zero Sitting balance - Comments: Posterior lateral lean to left.  Pt reaching with R UE for enusre bottles.  Pt reaching with multiple planes and required assist  to weight shift.  Pt also required increased time.       Standing balance-[name]: Poor                      Cognition Arousal/Alertness: Awake/alert Behavior During Therapy: WFL for tasks assessed/performed Overall Cognitive Status: Impaired/Different from baseline         Following Commands: Follows one step commands with increased time            Exercises      General Comments         Pertinent Vitals/Pain Pain Assessment: 0-10 Pain Score: 7  Pain Location: L shoulder blade.   Pain Descriptors / Indicators: Grimacing;Guarding;Discomfort Pain Intervention(s): Monitored during session;Repositioned    Home Living                      Prior Function            PT Goals (current goals can now be found in the care plan section) Acute Rehab PT Goals Patient Stated Goal: to get to the chair Potential to Achieve Goals: Good Additional Goals Additional Goal #1: propel/set up WC x [id number]. with supervision Progress towards PT goals: Progressing toward goals    Frequency  Min 5X/week    PT Plan Current plan remains appropriate    Co-evaluation PT/OT/SLP Co-Evaluation/Treatment: Yes Reason for Co-Treatment: Complexity of the patient's impairments (multi-system involvement);For patient/therapist safety PT goals addressed during session: Mobility/safety with mobility;Balance       End of Session Equipment Utilized During Treatment:  (bed pad and stedy frame.  ) Activity Tolerance: Patient limited by pain;Patient limited by fatigue Patient left: in chair;with call bell/phone within reach;with nursing/sitter in room     Time: [id number] PT Time Calculation (min) (ACUTE ONLY): 24 min  Charges:  $Therapeutic Activity: 8-22 mins                    G Codes:      Florestine Aversimee J Marguis Mathieson 05/12/2016, 3:06 PM  Joycelyn RuaAimee Rashawna Scoles, PTA pager [phone]

## 2016-05-12 NOTE — Progress Notes (Signed)
    [facility] has been requested to perform a transesophageal echocardiogram on [name] for stroke.  After careful review of history and examination, the risks and benefits of transesophageal echocardiogram have been explained including risks of esophageal damage, perforation (1:10,000 risk), bleeding, pharyngeal hematoma as well as other potential complications associated with conscious sedation including aspiration, arrhythmia, respiratory failure and death. Alternatives to treatment were discussed, questions were answered. Patient is willing to proceed.  TEE - Dr. Rennis GoldenHilty  @ 1500 . NPO after midnight. Meds with sips.   Nahiem Dredge, PA-C 05/12/2016 2:53 PM

## 2016-05-12 NOTE — Progress Notes (Signed)
Patient started having seizure about 17:42.  Sitter came and found [name] Bell.  Called rapid response.  Paged the trauma team. [facility] team.   Rapid response nurse and Dr Roxy Mannsster came to room.  Gave a total of 10mg  of Ativan.  Currently has Dilantin infusing.  Seizure stopped at about 18:32.  Trauma team ordered for patient to be transferred to step down.  Patient's vital signs are stable.

## 2016-05-12 NOTE — Progress Notes (Signed)
Occupational Therapy Treatment Patient Details Name: [name] Bell MRN: [id number] DOB: Aug 05, 1981 Today's Date: 05/12/2016    History of present illness [name] Bell is a 35 y.o. [facility] s/p attempted suicide: taped DNR note to chest, drank Drano, then jumped off 5 story building ([address]S/p 02/09/16 gel foam embolization of bil internal iliac arteries.S/p 02/09/16 ex lap with cholecystectomy, resection SB and mesenteric repair, liver lac repair, application or wound vac.S/p 02/09/16 closed reduction of pelvic ring, external pelvic fixation.Also has right metacarpal fracture in splint. Pt on and off intubation via trach/ and on/off trac collar.  Pelvic x- fixator pins removed at bedside on 03/23/16.  Underwent thoracotomy 6/19 due to empyema/abcess with 2 chest tubes, one d/c 04/30/16, second d/c 05/05/16 and pt no longer has trach.  Pt with no significant PMHx listed in the chart. Pt had acute speech and left sided weakness changes on7/3/17 and both CT and MRI confirmed stroke (R frontoparietal).     OT comments  Pt continues to demonstrate dense L hemiplegia. Requiring 2 person assist for bed mobility and use of Stedy to transfer to chair. Worked on trunk control with reaching activities at edge of chair. Pt with somewhat brighter affect than previous visit.  Follow Up Recommendations  CIR;Supervision/Assistance - 24 hour    Equipment Recommendations    Defer to next venue   Recommendations for Other Services      Precautions / Restrictions Precautions Precautions: Fall Precaution Comments: left hemiparesis Required Braces or Orthoses: Other Brace/Splint Restrictions Weight Bearing Restrictions: Yes RUE Weight Bearing: Weight bearing as tolerated LUE Weight Bearing: Weight bearing as tolerated RLE Weight Bearing: Weight bearing as tolerated LLE Weight Bearing: Weight bearing as tolerated       Mobility Bed Mobility Overal bed mobility: +2 for  physical assistance Bed Mobility: Supine to Sit     Supine to sit: Max assist;+2 for physical assistance Sit to supine: Max assist;+2 for physical assistance Sit to sidelying: +2 for physical assistance;Max assist General bed mobility comments: Assisted patient to edge of bed. Pt able to move RLE to edge while PTA assisted L side.  pt pulled ion PTA with R UE and PTA/OT assisted pt in trunk control and used pad to complete scooting to edge of bed.  Pt more motivated this session.    Transfers Overall transfer level: Needs assistance Equipment used: None (bed bad to boost hips into standing.  ) Transfers: Sit to/from Stand Sit to Stand: Mod assist;+2 physical assistance Stand pivot transfers:  (used stedy for stand pivot.  )       General transfer comment: Pt performed sit to stand from bed to standing in stedy frame.  Pt required bed pad to boost hips into standing. PTA/OT boosted hip while sitter supported RLE.  Pt demonstrated poor eccentric loading from stand to sit.  Lateral lean to left in frame noted.      Balance Overall balance assessment: Needs assistance Sitting-balance support: Feet supported Sitting balance-[name]: Poor Sitting balance - Comments: Posterior lateral lean to left.  Pt reaching with R UE for enusre bottles.  Pt reaching with multiple planes and required assist to weight shift.  Pt also required increased time.       Standing balance-[name]: Poor                     ADL   Eating/Feeding: Maximal assistance;Sitting Eating/Feeding Details (indicate cue type and reason): for drink,  pt able to perform hand to face, however                                   General ADL Comments: Worked on reaching with R UE in multiple planes at edge of chair to address trunk control.      Vision                     Perception     Praxis      Cognition   Behavior During Therapy: [facility] for tasks assessed/performed Overall  Cognitive Status: Impaired/Different from baseline Area of Impairment: Following commands        Following Commands: Follows one step commands with increased time            Extremity/Trunk Assessment               Exercises     Shoulder Instructions       General Comments      Pertinent Vitals/ Pain       Pain Assessment: Faces Pain Score: 7  Faces Pain Scale: Hurts even more Pain Location: L shoulder with touch Pain Descriptors / Indicators: Guarding;Grimacing Pain Intervention(s): Monitored during session;Repositioned;Premedicated before session  Home Living                                          Prior Functioning/Environment              Frequency Min 3X/week     Progress Toward Goals  OT Goals(current goals can now be found in the care plan section)  Progress towards OT goals: Progressing toward goals  Acute Rehab OT Goals Patient Stated Goal: to get to the chair Time For Goal Achievement: 05/25/16 Potential to Achieve Goals: Good  Plan Discharge plan remains appropriate    Co-evaluation    PT/OT/SLP Co-Evaluation/Treatment: Yes Reason for Co-Treatment: For patient/therapist safety;Necessary to address cognition/behavior during functional activity;Complexity of the patient's impairments (multi-system involvement) PT goals addressed during session: Mobility/safety with mobility;Balance OT goals addressed during session: Strengthening/ROM      End of Session Equipment Utilized During Treatment: Gait belt   Activity Tolerance Patient limited by pain;Patient tolerated treatment well   Patient Left in chair;with call bell/phone within reach;with chair alarm set;with nursing/sitter in room   Nurse Communication Need for lift equipment;Mobility status        Time: [id number] OT Time Calculation (min): 28 min  Charges: OT General Charges $OT Visit: 1 Procedure OT Treatments $Therapeutic Activity: 8-22  mins  Evern BioMayberry, Tyra Gural Lynn 05/12/2016, 3:45 PM  [phone]

## 2016-05-12 NOTE — Progress Notes (Signed)
Physical Therapy Treatment Patient Details Name: [name] Bell MRN: [id number] DOB: 06-14-1981 Today's Date: 05/12/2016    History of Present Illness [name] Bell is a 35 y.o. female [facility] s/p attempted suicide: taped DNR note to chest, drank Drano, then jumped off 5 story building ([address]).S/p 02/09/16 gel foam embolization of bil internal iliac arteries.S/p 02/09/16 ex lap with cholecystectomy, resection SB and mesenteric repair, liver lac repair, application or wound vac.S/p 02/09/16 closed reduction of pelvic ring, external pelvic fixation.Also has right metacarpal fracture in splint. Pt on and off intubation via trach/ and on/off trac collar.  Pelvic x- fixator pins removed at bedside on 03/23/16.  Underwent thoracotomy 6/19 due to empyema/abcess with 2 chest tubes, one d/c 04/30/16, second d/c 05/05/16 and pt no longer has trach.  Pt with no significant PMHx listed in the chart. Pt had acute speech and left sided weakness changes on7/3/17 and both CT and MRI confirmed stroke (R frontoparietal).      PT Comments    Assisted patient back to bed from recliner.  Pt able to tolerate 48 min in chair compared to 28 min yesterday.  Pt fatigued post transfer.  Required increased assist with squat pivot recommend stedy next visit as this method works better and patient feels more comfortable.     Follow Up Recommendations  CIR     Equipment Recommendations  Wheelchair (measurements PT);Wheelchair cushion (measurements PT);Hospital bed;Other (comment);3in1 (PT);Rolling walker with 5" wheels    Recommendations for Other Services Rehab consult     Precautions / Restrictions Precautions Precautions: Fall Precaution Comments: left hemiparesis Required Braces or Orthoses: Other Brace/Splint Restrictions Weight Bearing Restrictions: Yes RUE Weight Bearing: Weight bearing as tolerated LUE Weight Bearing: Weight bearing as tolerated RLE Weight Bearing: Weight bearing as  tolerated LLE Weight Bearing: Weight bearing as tolerated    Mobility  Bed Mobility Overal bed mobility: +2 for physical assistance Bed Mobility: sit to supine Sit to supine: +2 for physical assistance;Max assist;Total assist  General bed mobility comments: Assisted pt back to bed required +2 assist with trunk and LEs to advance patient back to supine position. Scoot in supine with bed pad and +2 assistance.   Transfers Overall transfer level: Needs assistance Equipment used: None (bed pad to assist in boosting hips. ) Transfers: Squat Pivot Transfers  Squat pivot transfers: Total assist;+2 physical assistance (+2 with increased assist after fatigue.  )     General transfer comment: Pt performed squat pivot back to bed from recliner chair.  Pt required assist to boost hips with bed pad.  Pt complaining of pain in L shoulder during transfer.  Required increased assist with this technique.  will performed standing with stedy next time.  Ambulation/Gait                 Stairs            Wheelchair Mobility    Modified Rankin (Stroke Patients Only)       Balance                      Cognition Arousal/Alertness: Awake/alert Behavior During Therapy: WFL for tasks assessed/performed Overall Cognitive Status: Impaired/Different from baseline Area of Impairment: Following commands       Following Commands: Follows one step commands with increased time            Exercises      General Comments  Pertinent Vitals/Pain Pain Assessment: Faces Pain Score: 7  Faces Pain Scale: Hurts even more Pain Location: L shoulder with touch Pain Descriptors / Indicators: Guarding;Grimacing Pain Intervention(s): Monitored during session;Repositioned;Premedicated before session    Home Living                      Prior Function            PT Goals (current goals can now be found in the care plan section) Acute Rehab PT Goals Patient  Stated Goal: to get back to bed.   Potential to Achieve Goals: Good Additional Goals Additional Goal #1: propel/set up WC x [id number]. with supervision Progress towards PT goals: Progressing toward goals    Frequency  Min 5X/week    PT Plan Current plan remains appropriate    Co-evaluation PT/OT/SLP Co-Evaluation/Treatment: Yes Reason for Co-Treatment: For patient/therapist safety;Necessary to address cognition/behavior during functional activity;Complexity of the patient's impairments (multi-system involvement) PT goals addressed during session: Mobility/safety with mobility;Balance OT goals addressed during session: Strengthening/ROM     End of Session Equipment Utilized During Treatment: Gait belt Activity Tolerance: Patient limited by pain;Patient limited by fatigue Patient left: with call bell/phone within reach;with nursing/sitter in room;in bed     Time: 1610-96041518-1529 PT Time Calculation (min) (ACUTE ONLY): 11 min  Charges:  $Therapeutic Activity: 8-22 mins                    G Codes:      Florestine Aversimee J Terricka Onofrio 05/12/2016, 4:13 PM Joycelyn RuaAimee Jef Futch, PTA pager [phone]

## 2016-05-12 NOTE — Progress Notes (Signed)
Neurology Progress Note  Subjective: Called emergently to see the patient due to seizure activity. Rapid response was called to the patient's room after she developed left-sided seizure activity at 1742. Per RN, she had left head turn with left head deviation along with tonic-clonic activity of the left arm and leg and the left face. She had been given 4 mg of Ativan prior to my arrival without change in the activity. There is no reported history of seizure. She is being followed by the stroke service. 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   Current Meds:   Current facility-administered medications:  .  aspirin EC tablet 325 mg, 325 mg, Oral, Daily, Marvel PlanJindong Xu, MD, 325 mg at 05/12/16 0945 .  atorvastatin (LIPITOR) tablet 20 mg, 20 mg, Oral, q1800, Marvel PlanJindong Xu, MD, 20 mg at 05/12/16 1639 .  bacitracin ointment, , Topical, BID, Freeman CaldronMichael J Jeffery, PA-C .  bisacodyl (DULCOLAX) EC tablet 10 mg, 10 mg, Oral, Daily, Donielle M Zimmerman, PA-C, 10 mg at 05/08/16 1056 .  clonazePAM (KLONOPIN) tablet 0.5 mg, 0.5 mg, Oral, QHS, Freeman CaldronMichael J Jeffery, PA-C, 0.5 mg at 05/11/16 2135 .  diazepam (VALIUM) injection 10 mg, 10 mg, Intravenous, Once, Marvel PlanJindong Xu, MD .  enoxaparin (LOVENOX) injection 30 mg, 30 mg, Subcutaneous, Q12H, Donielle M Zimmerman, PA-C, 30 mg at 05/12/16 0946 .  hydrALAZINE (APRESOLINE) injection 5 mg, 5 mg, Intravenous, Q6H PRN, Adam PhenixElizabeth S Simaan, PA-C, 5 mg at 05/02/16 2145 .  ipratropium-albuterol (DUONEB) 0.5-2.5 (3) MG/3ML nebulizer solution 3 mL, 3 mL, Nebulization, Q6H PRN, Jimmye NormanJames [name], MD, 3 mL at 04/03/16 2354 .  naproxen (NAPROSYN) tablet 500 mg, 500 mg, Oral, BID WC, Freeman CaldronMichael J Jeffery, PA-C, 500 mg at 05/12/16 1639 .  oxyCODONE (Oxy IR/ROXICODONE) immediate release tablet 10-20 mg, 10-20 mg, Oral, Q4H PRN, Freeman CaldronMichael J Jeffery, PA-C, 10 mg at 05/12/16 1458 .  polyethylene glycol (MIRALAX / GLYCOLAX) packet 17 g, 17 g,  Oral, Daily, Freeman CaldronMichael J Jeffery, PA-C, 17 g at 05/08/16 1057 .  senna-docusate (Senokot-S) tablet 1 tablet, 1 tablet, Oral, QHS, Donielle Margaretann LovelessM Zimmerman, PA-C, 1 tablet at 05/10/16 2040 .  sertraline (ZOLOFT) tablet 100 mg, 100 mg, Oral, Daily, Leata MouseJanardhana Jonnalagadda, MD, 100 mg at 05/12/16 0947 .  simethicone (MYLICON) chewable tablet 80 mg, 80 mg, Oral, Q6H PRN, Manus RuddMatthew Tsuei, MD, 80 mg at 05/01/16 1328 .  sodium phosphate (FLEET) 7-19 GM/118ML enema 1 enema, 1 enema, Rectal, Daily PRN, [name] Thompson, MD, 1 enema at 05/09/16 1538 .  sulfamethoxazole-trimethoprim (BACTRIM DS,SEPTRA DS) 800-160 MG per tablet 1 tablet, 1 tablet, Oral, Q12H, Freeman CaldronMichael J Jeffery, PA-C, 1 tablet at 05/12/16 0947  Objective:  Temp:  [98.2 F (36.8 C)-98.9 F (37.2 C)] 98.3 F (36.8 C) (07/05 1500) Pulse Rate:  [78-114] 114 (07/05 1750) Resp:  [17-20] 18 (07/05 1500) BP: (103-134)/(57-77) 134/77 mmHg (07/05 1750) SpO2:  [98 %-100 %] 100 % (07/05 1500) Weight:  [84.732 kg (186 lb 12.8 oz)] 84.732 kg (186 lb 12.8 oz) (07/05 0500)  General: WDWN AA woman lying in bed. She is having active seizure activity involving the left arm and leg with a left head turn. She will look at me when I speak to her. She has purposeful spontaneous activity with the right arm and leg and will follow commands on that side as well.  HEENT: Neck is supple. Sclerae are anicteric. There is no conjunctival injection. She does not open her mouth so OP  cannot be assessed.  CV: Tachy, regular with 2/6 murmur. Carotid pulses are 2+ and symmetric with no bruits. Distal pulses 2+ and symmetric.  Lungs: CTAB on anterior exam.  Extremities: No C/C/E. Neuro: MS: As noted above. She is nonverbal.  CN: Pupils are equal and sluggishly reactive from 4-->3 mm bilaterally. Eyes are conjugate with a left gaze preference but no forced deviation. Corneals are intact bilaterally. She has some twitching of the left eye and mouth. The remainder of her cranial nerves  could not be assessed due to poor cooperation.  Motor: Continuous focal left seizure activity as noted above.   Labs: Lab Results  Component Value Date   WBC 9.9 05/11/2016   HGB 8.6* 05/11/2016   HCT 28.0* 05/11/2016   PLT 248 05/11/2016   GLUCOSE 105* 05/11/2016   CHOL 180 05/11/2016   TRIG 108 05/11/2016   HDL 46 05/11/2016   LDLCALC 112* 05/11/2016   ALT 8* 04/27/2016   AST 12* 04/27/2016   NA 134* 05/11/2016   K 4.4 05/11/2016   CL 102 05/11/2016   CREATININE 1.46* 05/11/2016   BUN 18 05/11/2016   CO2 23 05/11/2016   INR 1.52* 03/27/2016   HGBA1C 5.4 05/11/2016   CBC Latest Ref Rng 05/11/2016 05/10/2016 04/30/2016  WBC 4.0 - 10.5 K/uL 9.9 13.8(H) 15.7(H)  Hemoglobin 12.0 - 15.0 g/dL 1.6(X8.6(L) 0.9(U8.8(L) 10.7(L)  Hematocrit 36.0 - 46.0 % 28.0(L) 28.8(L) 34.0(L)  Platelets 150 - 400 K/uL 248 250 338    Lab Results  Component Value Date   HGBA1C 5.4 05/11/2016   Lab Results  Component Value Date   ALT 8* 04/27/2016   AST 12* 04/27/2016   ALKPHOS 79 04/27/2016   BILITOT 1.0 04/27/2016    A/P:   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 I gave her an additional 6 mg of IV Ativan for a total of 10 mg. She was also given a 15 mg/kg load of IV phenytoin. Her seizures stopped partway through the phenytoin load. This was completed. I will also load her with lacosamide 150 mg IV as she is high risk for recurrence of her seizures. Seizure precautions. I will order EEG for AM.   This was discussed with the attending trauma surgeon. He is aware of the course of events and will arrange to have the patient transferred to stepdown for closer monitoring.   Rhona Leavensimothy Roy Tokarz, MD Neurology

## 2016-05-12 NOTE — Consult Note (Addendum)
SLP Cancellation Note  Patient Details Name: [name] Bell MRN: [id number] DOB: 05-01-1981   Cancelled Evaluation:        Orders received for evaluation of communication and cognition following 05/10/16 MRI results indicating subacute-chronic right frontoparietal infarct. Pt resting in bed, and declined participation at this time. Will continue efforts.  Leigh AuroraBueche, Celia Brown 05/12/2016, 4:02 PM  Celia B. Murvin NatalBueche, [facility] Hope HospitalMSP, CCC-SLP [phone]

## 2016-05-12 NOTE — Progress Notes (Signed)
VASCULAR LAB PRELIMINARY  PRELIMINARY  PRELIMINARY  PRELIMINARY  Transcarnial Doppler with bubbles completed.    Preliminary report:  Negative for PFO  Duriel Deery, RVS 05/12/2016, 1:19 PM

## 2016-05-12 NOTE — Progress Notes (Signed)
Pt refused to have vital signs taken. Will continue to monitor and retry asking her later.

## 2016-05-12 NOTE — Significant Event (Signed)
Rapid Response Event Note  Overview:  Called by RN stat for new onset seizure like activity Time Called: 1738 Arrival Time: 1742 Event Type: Neurologic  Initial Focused Assessment: AS per RN patient was sleeping, moaned and went into seizure like activity.  Upon my arrival to patients room, Rn at bedside.  Patient lying supine in bed with head tilted to left, leftward gaze, eyes fluttering and left side shaking.  Patient placed on nasal cannula 5 lpm, Sats 100%.  HR 115, 134/77.  Patient able to follow commands on right side.     Interventions:  Dr. Donell BeersByerly called and updated.  Dr Roxy Mannsster at bedside.  Total of 10 mg ativan given per IV.  Dilantin load started as per Dr. Roxy Mannsster.   Plan of Care (if not transferred): Patient to transfer to SDU  Event Summary: seizure broke at 1830, lasted 50 minutes. Rn to call if assistance needed   at      at          [facility] Hospital[facility]fe, Maryagnes Amosenise Ann

## 2016-05-12 NOTE — Progress Notes (Signed)
VASCULAR LAB PRELIMINARY  PRELIMINARY  PRELIMINARY  PRELIMINARY  Bilateral lower extremity venous duplex completed.    Preliminary report:  Bilateral:  No evidence of DVT, superficial thrombosis, or Baker's Cyst.   Leslie Langille, RVS 05/12/2016, 1:56 PM

## 2016-05-12 NOTE — Progress Notes (Signed)
STROKE TEAM PROGRESS NOTE   SUBJECTIVE (INTERVAL HISTORY) Pt was seen today at vascular lab.  She still has left hemiplegia. CTA head and neck no dissection and TCD bubble study and LE venous doppler were both negative. TEE pending   OBJECTIVE Temp:  [98.2 F (36.8 C)-98.9 F (37.2 C)] 98.3 F (36.8 C) (07/05 1500) Pulse Rate:  [78-91] 86 (07/05 1500) Cardiac Rhythm:  [-] Normal sinus rhythm (07/05 0700) Resp:  [17-20] 18 (07/05 1500) BP: (103-114)/(57-67) 114/66 mmHg (07/05 1500) SpO2:  [98 %-100 %] 100 % (07/05 1500) Weight:  [186 lb 12.8 oz (84.732 kg)] 186 lb 12.8 oz (84.732 kg) (07/05 0500)  CBC:   Recent Labs Lab 05/10/16 1143 05/11/16 1108  WBC 13.8* 9.9  HGB 8.8* 8.6*  HCT 28.8* 28.0*  MCV 89.7 89.2  PLT 250 248    Basic Metabolic Panel:   Recent Labs Lab 05/11/16 1108  NA 134*  K 4.4  CL 102  CO2 23  GLUCOSE 105*  BUN 18  CREATININE 1.46*  CALCIUM 9.4    Lipid Panel:     Component Value Date/Time   CHOL 180 05/11/2016 0518   TRIG 108 05/11/2016 0518   HDL 46 05/11/2016 0518   CHOLHDL 3.9 05/11/2016 0518   VLDL 22 05/11/2016 0518   LDLCALC 112* 05/11/2016 0518   HgbA1c:  Lab Results  Component Value Date   HGBA1C 5.4 05/11/2016     IMAGING I have personally reviewed the radiological images below and agree with the radiology interpretations.  Dg Chest 2 View 05/10/2016  Enlarging loculated right pleural fluid collection, possibly reaccumulation of an empyema. Probable airspace opacification in the right lung. Electronically Signed   By: Leanna BattlesMelinda  Blietz M.D.   On: 05/10/2016 16:00   Ct Head Wo Contrast 05/10/2016  SUBTLE AREA OF LOW DENSITY AND LOSS OF GRAY-WHITE DIFFERENTIATION IN THE ANTERIOR RIGHT FRONTAL LOBE COMPATIBLE WITH ACUTE INFARCTION.  02/10/2016 No acute intracranial abnormality or acute abnormality of the cervical spine. Sinus disease is likely related to intubation and OG tube placement. The patient's OG tube is looped in the  pharynx and mouth.  Mr Brain Wo Contrast 05/10/2016  Subacute to chronic appearing RIGHT frontoparietal lobe infarct in RIGHT MCA distribution with petechial hemorrhage. Findings less likely represent venous infarct. 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   Ct Angio head & Neck W Or Wo Contrast 05/11/2016   Progression of cytotoxic edema compared with the initial CT from 05/10/2016 in the RIGHT frontal lobe. Moderate irregularity of the A3 and A4 segments, anterior cerebral artery on the RIGHT, correlates with the observed pattern of restricted diffusion on MR; considerations would include vasculitis, intracranial atherosclerosis, or vasospasm. No intracranial large vessel occlusion or extracranial stenosis of significance. No findings to support cortical venous or dural venous thrombosis. Suspected recurrent empyema in the RIGHT chest, 9 x 10 cm cross-section with air-fluid level. Recommend CT chest with contrast for further evaluation.   2D Echocardiogram  02/25/2016 - Left ventricle: The cavity size was normal. Systolic function was normal. The estimated ejection fraction was in the range of 60% to 65%. Wall motion was normal; there were no regional wall motion abnormalities. Left ventricular diastolic function parameters were normal. - Aortic valve: Trileaflet; mildly thickened leaflets. Valve area (VTI): 1.91 cm^2. Valve area (Vmax): 2.11 cm^2. Valve area (Vmean): 2.14 cm^2. - Right atrium: Mobile linear density seen in the right atrium.  This most likely represents  a catheter. There is a bright target off of this that is highly mobile and concerning for possible thrombus or vegetation. This is not seen in all views. Recommend TEE for further evaluation is clinically indicated. - Pulmonary arteries: PA peak pressure: 39 mm Hg (S). Impressions:   The right ventricular systolic pressure was increased consistent with mild  pulmonary hypertension.  TCD bubble study - negative for PFO  LE venous doppler - negative for DVT    PHYSICAL EXAM  Temp:  [98.2 F (36.8 C)-98.9 F (37.2 C)] 98.3 F (36.8 C) (07/05 1500) Pulse Rate:  [78-91] 86 (07/05 1500) Resp:  [17-20] 18 (07/05 1500) BP: (103-114)/(57-67) 114/66 mmHg (07/05 1500) SpO2:  [98 %-100 %] 100 % (07/05 1500) Weight:  [186 lb 12.8 oz (84.732 kg)] 186 lb 12.8 oz (84.732 kg) (07/05 0500)  General - Well nourished, well developed, in no apparent distress.  Ophthalmologic - Fundi not visualized due to noncooperation.  Cardiovascular - Regular rate and rhythm.  Mental Status -  Level of arousal and orientation to time, place, and person were intact. Language including expression, naming, repetition, comprehension was assessed and found intact, however, significant scanning speech and bradyphasia. Fund of Knowledge was assessed and was intact.  Cranial Nerves II - XII - II - Visual field intact OU. III, IV, VI - Extraocular movements intact. V - Facial sensation intact bilaterally except subjective right V2 decreased sensation about 80% of the left. VII - Facial movement intact bilaterally. VIII - Hearing & vestibular intact bilaterally. X - Palate elevates symmetrically. XI - Chin turning & shoulder shrug intact bilaterally XII - Tongue protrusion intact.  Motor Strength - The patient's strength was 5/5 RUE and RLE, but 0/5 LUE and LLE.  Bulk was normal and fasciculations were absent.   Motor Tone - Muscle tone was assessed at the neck and appendages and was normal.  Reflexes - The patient's reflexes were 1+ in all extremities and she had no pathological reflexes.  Sensory - Light touch, temperature/pinprick were assessed and were subjectively decreased on the right UE and LE, about 90% of the left    Coordination - The patient had normal movements in the right hand with no ataxia or dysmetria.  Tremor was absent.  Gait and Station - not  tested due to weakness.   ASSESSMENT/PLAN [name] is a 35 y.o. female admitted 3 mos ago after drinking drano and jumping from a 5th floor window who developed aphasia in the hospital with decreased movements L side. She did not receive IV t-PA due to unknown LKW, recent trauma.   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  Resultant  Left hemiplegia  MRI  R ACA and MCA/ACA infarct with petechial hemorrhage. Severe diffuse axonal injury.  CTA head & neck  Irregular R ACA A3-A4 segments, no carotid dissection  LE venous Doppler negative for DVT  TCD bubble study pending negative for PFO  2D Echo in 02/2016 showed R atrium bright target, highly mobile, concerning for possible thrombus/vegetation - recommended TEE at that time - will plan for TEE tomorrow.  LDL 112  HgbA1c 5.4  Hypercoagulable labls pending   Lovenox 30 mg sq daily for VTE prophylaxis Diet regular Room service appropriate?: Yes; Fluid consistency:: Thin  No antithrombotic prior to admission, now on aspirin 325 mg daily.  Patient counseled to be compliant with her antithrombotic medications  Ongoing aggressive stroke risk factor management  Therapy recommendations:  CIR  Disposition:  pending   Hyperlipidemia  Home meds:  No statin  LDL 112, goal < 70  Now on lipitor 20  Continue statin at discharge  Other Stroke Risk Factors  Overweight, Body mass index is 26.06 kg/(m^2).  Other Active Problems  Trauma s/p suicidal attempt and fall  Suicidal attempt  R empyema, chest wall wound  Hospital day # [id number]  Marvel PlanJindong Sayda Grable, MD PhD Stroke Neurology 05/12/2016 4:31 PM      To contact Stroke Continuity provider, please refer to [email or web address]. After hours, contact General Neurology

## 2016-05-12 NOTE — Procedures (Signed)
[facility]  [address]  [place]. [place] [id number].  [phone]   TRANSCRANIAL DOPPLER BUBBLE STUDY  [name] BobKatia D Bell  Date of Birth: 29-May-1981 Medical Record Number: [id number] Indications: embolic stroke Date of Procedure: 05/12/2016 Clinical History: suicidal attempt, fall and embolic stroke Technical Description: Transcranial Doppler Bubble Study was performed at the bedside after taking written informed consent from the patient and explaining risk/benefits. The right middle cerebral artery was insonated using a hand held probe. And IV line had been previously inserted in the right hand by the RN using aseptic precautions. Agitated saline injection at rest and after valsalva maneuver did not result in high intensity transient signals (HITS).  Impression: negative Transcranial Doppler Bubble Study indicative of no right to left shunt   Results were explained to the patient. Questions were answered.  [name] PlanJindong Lucina Betty, MD PhD Stroke Neurology 05/12/2016 4:31 PM

## 2016-05-13 ENCOUNTER — Inpatient Hospital Stay (HOSPITAL_COMMUNITY): Payer: Medicaid Other

## 2016-05-13 LAB — CARDIOLIPIN ANTIBODIES, IGG, IGM, IGA
Anticardiolipin IgA: <9 APL U/mL (ref 0–11)
Anticardiolipin IgG: <9 GPL U/mL (ref 0–14)
Anticardiolipin IgM: 17 MPL U/mL — ABNORMAL HIGH (ref 0–12)

## 2016-05-13 LAB — BETA-2-GLYCOPROTEIN I ABS, IGG/M/A
Beta-2 Glyco I IgG: <9 GPI IgG units (ref 0–20)
Beta-2-Glycoprotein I IgA: 11 GPI IgA units (ref 0–25)
Beta-2-Glycoprotein I IgM: <9 GPI IgM units (ref 0–32)

## 2016-05-13 MED ORDER — SODIUM CHLORIDE 0.9 % IV SOLN
INTRAVENOUS | Status: DC
  Administered 2016-05-13 – 2016-05-18 (×2): via INTRAVENOUS
  Filled 2016-05-13: qty 1000

## 2016-05-13 MED ORDER — HYDRALAZINE HCL 20 MG/ML IJ SOLN: 5.0000 mg | Freq: Four times a day (QID) | INTRAMUSCULAR | Status: DC | PRN

## 2016-05-13 MED ORDER — LACOSAMIDE 200 MG/20ML IV SOLN
100.0000 mg | Freq: Two times a day (BID) | INTRAVENOUS | Status: DC
  Administered 2016-05-13 – 2016-05-28 (×30): 100 mg via INTRAVENOUS
  Filled 2016-05-13 (×54): qty 10

## 2016-05-13 MED ORDER — IOPAMIDOL (ISOVUE-300) INJECTION 61%
INTRAVENOUS | Status: AC
  Filled 2016-05-13: qty 75

## 2016-05-13 MED ORDER — NAPROXEN 250 MG PO TABS: 500.0000 mg | ORAL_TABLET | Freq: Two times a day (BID) | ORAL | Status: DC | PRN

## 2016-05-13 MED ORDER — ENOXAPARIN SODIUM 40 MG/0.4ML ~~LOC~~ SOLN: 40.0000 mg | SUBCUTANEOUS | Status: DC

## 2016-05-13 MED ORDER — CETYLPYRIDINIUM CHLORIDE 0.05 % MT LIQD
7.0000 mL | Freq: Two times a day (BID) | OROMUCOSAL | Status: DC
  Administered 2016-05-14: 7 mL via OROMUCOSAL

## 2016-05-13 NOTE — Progress Notes (Signed)
Right posterior chest dressing changed.  Wound repacked with sterile gauze, covered with dry gauze.  Wound bed 100% red, granulation tissue.

## 2016-05-13 NOTE — Progress Notes (Signed)
PT Cancellation Note  Patient Details Name: [name] Bell MRN: [id number] DOB: 1981/07/18   Cancelled Treatment:    Reason Eval/Treat Not Completed: Patient declined, no reason specified. Patient refused to work with PT today. She is very upset about her stroke and now seizure. Will have PT try again tomorrow   Kallie LocksHannah Shereena Berquist, Leda GauzeSPTA #[phone] 05/13/2016, 12:22 PM

## 2016-05-13 NOTE — Progress Notes (Signed)
Patient ID: [name] Bell, female   DOB: 03-Sep-1981, 35 y.o.   MRN: [id number] 17 Days Post-Op  Subjective: C/O unable to move L side, wants to take off O2  Objective: Vital signs in last 24 hours: Temp:  [97.1 F (36.2 C)-98.6 F (37 C)] 98.5 F (36.9 C) (07/06 0731) Pulse Rate:  [85-114] 85 (07/06 0731) Resp:  [18-21] 18 (07/06 0731) BP: (103-134)/(57-82) 122/82 mmHg (07/06 0731) SpO2:  [100 %] 100 % (07/06 0731) Last BM Date: 05/11/16  Intake/Output from previous day: 07/05 0701 - 07/06 0700 In: 240 [P.O.:240] Out: -  Intake/Output this shift:    General appearance: cooperative Resp: decreased BS on R Cardio: regular rate and rhythm GI: soft, NT, wound clean Neuro: speech a bit slow but fluent, F/C on R, no movement LUE or LLE, some spasms when I moved LLE, claims LTS intact LUE and LLE  Lab Results: CBC   Recent Labs  05/10/16 1143 05/11/16 1108  WBC 13.8* 9.9  HGB 8.8* 8.6*  HCT 28.8* 28.0*  PLT 250 248   BMET  Recent Labs  05/11/16 1108  NA 134*  K 4.4  CL 102  CO2 23  GLUCOSE 105*  BUN 18  CREATININE 1.46*  CALCIUM 9.4   PT/INR No results for input(s): LABPROT, INR in the last 72 hours. ABG No results for input(s): PHART, HCO3 in the last 72 hours.  Invalid input(s): PCO2, PO2  Anti-infectives: Anti-infectives    Start     Dose/Rate Route Frequency Ordered Stop   05/10/16 1230  sulfamethoxazole-trimethoprim (BACTRIM DS,SEPTRA DS) 800-160 MG per tablet 1 tablet     1 tablet Oral Every 12 hours 05/10/16 1122 05/21/16 0959   05/07/16 1300  ciprofloxacin (CIPRO) tablet 500 mg  Status:  Discontinued     500 mg Oral 2 times daily 05/07/16 1200 05/10/16 1122   05/06/16 1700  ceFEPIme (MAXIPIME) 2 g in dextrose 5 % 50 mL IVPB  Status:  Discontinued     2 g 100 mL/hr over 30 Minutes Intravenous Every 8 hours 05/06/16 1316 05/07/16 1200   04/27/16 1000  ceFEPIme (MAXIPIME) 2 g in dextrose 5 % 50 mL IVPB  Status:  Discontinued     2 g 100 mL/hr  over 30 Minutes Intravenous Every 12 hours 04/27/16 0818 05/06/16 1316   04/22/16 1200  piperacillin-tazobactam (ZOSYN) IVPB 3.375 g  Status:  Discontinued     3.375 g 12.5 mL/hr over 240 Minutes Intravenous Every 6 hours 04/22/16 1039 04/22/16 1042   04/22/16 1200  vancomycin (VANCOCIN) 1,500 mg in sodium chloride 0.9 % 500 mL IVPB  Status:  Discontinued     1,500 mg 250 mL/hr over 120 Minutes Intravenous Every 12 hours 04/22/16 1133 04/28/16 0115   04/22/16 1130  piperacillin-tazobactam (ZOSYN) IVPB 3.375 g  Status:  Discontinued     3.375 g 12.5 mL/hr over 240 Minutes Intravenous Every 8 hours 04/22/16 1043 04/27/16 0754   04/02/16 1200  fluconazole (DIFLUCAN) 40 MG/ML suspension 400 mg     400 mg Per Tube  Once 04/02/16 1026 04/02/16 1442   03/31/16 1000  sulfamethoxazole-trimethoprim (BACTRIM,SEPTRA) 200-40 MG/5ML suspension 40 mL  Status:  Discontinued     40 mL Per Tube Every 12 hours 03/31/16 0927 03/31/16 0956   03/31/16 1000  sulfamethoxazole-trimethoprim (BACTRIM,SEPTRA) 200-40 MG/5ML suspension 20 mL     20 mL Per Tube Every 6 hours 03/31/16 0956 04/03/16 2359   03/26/16 1030  cefTRIAXone (ROCEPHIN) 2 g in dextrose 5 %  50 mL IVPB  Status:  Discontinued     2 g 100 mL/hr over 30 Minutes Intravenous Every 24 hours 03/26/16 1023 03/31/16 0921   03/25/16 1000  ceFEPIme (MAXIPIME) 2 g in dextrose 5 % 50 mL IVPB  Status:  Discontinued     2 g 100 mL/hr over 30 Minutes Intravenous Every 12 hours 03/25/16 0829 03/26/16 1023   03/08/16 1400  levofloxacin (LEVAQUIN) IVPB 750 mg     750 mg 100 mL/hr over 90 Minutes Intravenous Every 24 hours 03/08/16 0912 03/10/16 1501   03/06/16 1400  ceFEPIme (MAXIPIME) 2 g in dextrose 5 % 50 mL IVPB  Status:  Discontinued     2 g 100 mL/hr over 30 Minutes Intravenous Every 12 hours 03/06/16 1344 03/08/16 0912   03/01/16 1330  ceFAZolin (ANCEF) IVPB 2g/100 mL premix     2 g 200 mL/hr over 30 Minutes Intravenous To [facility] 02/29/16 0917  03/01/16 1452   02/11/16 0600  piperacillin-tazobactam (ZOSYN) IVPB 3.375 g  Status:  Discontinued     3.375 g 100 mL/hr over 30 Minutes Intravenous 4 times per day 02/10/16 2214 02/22/16 0853   02/10/16 1730  piperacillin-tazobactam (ZOSYN) IVPB 3.375 g  Status:  Discontinued     3.375 g 12.5 mL/hr over 240 Minutes Intravenous 3 times per day 02/10/16 1727 02/10/16 2214      Assessment/Plan: Jump from hotel/multiple toxic ingestions- Continue on Zoloft, sitter for safety.  S/P ex lap, hepatorraphy, SBR, cholecystectomy, preperitoneal pelvic packing [name] 4/3  Ex lap for acidosis/bleeding 4/4 Tipton Ballow Ex lap for bleeding 4/5 [name] S/P ex fix pelvis [name] 4/3 S/P ex lap 4/7 Allon Costlow S/P ex lap 4/10 Zaray Gatchel S/P sacral screws 4/10 [name] S/P application ABRA, open gastrostomy tube 4/12 [name]  S/P abd closure, trach 4/24 [name] Mult B rib FX/R HPTX, B pulm contusions  Multiple toxic ingestions Mult L wrist FXs - ok to remove splint per Dr. Melvyn Novasrtmann  Pelvic FX - S/P ex fix and angioembolization, S/P sacral screw by Dr. Carola FrostHandy. WBAT BLE. ABL anemia Right empyema s/p thoracotomy -- Per TTS, on Cipro. Recurrent effusion on R. D/W Dr. Laneta SimmersBartle this AM. Will get CT chest later today to evaluate. CVA -- R ACA and MCA distribution with L hemiparesis, for TEE today to evaluate possible right atrial vegetation. New Sz last night due to this. Appreciate neurology F/U and I spoke with the team this AM. FEN - add IVF to hydrate with CT chest P VTE - SCD's, Lovenox Dispo - SDU for now. [facility]  LOS: 94 days    [name] Markitta Ausburn, MD, MPH, FACS Trauma: [phone] General Surgery: [phone]  05/13/2016

## 2016-05-13 NOTE — Progress Notes (Signed)
Patient ID: [name] Bell, female   DOB: September 07, 1981, 35 y.o.   MRN: [id number] I met with her sister and updated her on [name]'s current situation. I discussed the plan of care and answered her questions. Georganna Skeans, MD, MPH, FACS Trauma: [phone] General Surgery: [phone]

## 2016-05-13 NOTE — Procedures (Signed)
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   Medications:  Current facility-administered medications:  .  atorvastatin (LIPITOR) tablet 20 mg, 20 mg, Oral, q1800, Marvel PlanJindong Xu, MD, 20 mg at 05/13/16 1747 .  bacitracin ointment, , Topical, BID, Freeman CaldronMichael J Jeffery, PA-C .  bisacodyl (DULCOLAX) EC tablet 10 mg, 10 mg, Oral, Daily, Donielle M Zimmerman, PA-C, 10 mg at 05/08/16 1056 .  clonazePAM (KLONOPIN) tablet 0.5 mg, 0.5 mg, Oral, QHS, Freeman CaldronMichael J Jeffery, PA-C, 0.5 mg at 05/12/16 2247 .  diazepam (VALIUM) injection 10 mg, 10 mg, Intravenous, Once, Marvel PlanJindong Xu, MD .  hydrALAZINE (APRESOLINE) injection 5-20 mg, 5-20 mg, Intravenous, Q6H PRN, Marvel PlanJindong Xu, MD .  iopamidol (ISOVUE-300) 61 % injection, , , ,  .  ipratropium-albuterol (DUONEB) 0.5-2.5 (3) MG/3ML nebulizer solution 3 mL, 3 mL, Nebulization, Q6H PRN, Jimmye NormanJames [name], MD, 3 mL at 04/03/16 2354 .  lacosamide (VIMPAT) 100 mg in sodium chloride 0.9 % 25 mL IVPB, 100 mg, Intravenous, Q12H, Marvel PlanJindong Xu, MD, 100 mg at 05/13/16 0918 .  oxyCODONE (Oxy IR/ROXICODONE) immediate release tablet 10-20 mg, 10-20 mg, Oral, Q4H PRN, Freeman CaldronMichael J Jeffery, PA-C, 15 mg at 05/13/16 1943 .  polyethylene glycol (MIRALAX / GLYCOLAX) packet 17 g, 17 g, Oral, Daily, Freeman CaldronMichael J Jeffery, PA-C, 17 g at 05/08/16 1057 .  senna-docusate (Senokot-S) tablet 1 tablet, 1 tablet, Oral, QHS, Donielle Margaretann LovelessM Zimmerman, PA-C, 1 tablet at 05/12/16 2247 .  sertraline (ZOLOFT) tablet 100 mg, 100 mg, Oral, Daily, Leata MouseJanardhana Jonnalagadda, MD, 100 mg at 05/13/16 1503 .  simethicone (MYLICON) chewable tablet 80 mg, 80 mg, Oral, Q6H PRN, Manus RuddMatthew Tsuei, MD, 80 mg at 05/01/16 1328 .  sodium chloride 0.9 % 1,000 mL infusion, , Intravenous, Continuous,  [name] Thompson, MD, Last Rate: 50 mL/hr at 05/13/16 2000 .  sodium phosphate (FLEET) 7-19 GM/118ML enema 1 enema, 1 enema, Rectal, Daily PRN, [name] Thompson, MD, 1 enema at 05/09/16 1538 .  sulfamethoxazole-trimethoprim (BACTRIM DS,SEPTRA DS) 800-160 MG per tablet 1 tablet, 1 tablet, Oral, Q12H, Freeman CaldronMichael J Jeffery, PA-C, 1 tablet at 05/13/16 1503  Description: This is a routine EEG performed using standard international 10-20 electrode placement. A total of 18 channels are recorded, including one for the EKG.  Activating Maneuvers: none  Findings:  The background consists of well-formed alpha activity on the left side with a best dominant posterior rhythm of 8 Hz. There is reduced voltage and slowing consisting of moderate amplitude delta activity throughout the right cerebral hemisphere, worst frontally.   No epileptiform discharges are present. No seizures are recorded.     Impression: This is an abnormal EEG due to focal right hemispheric slowing. This is consistent with known stroke in that region. No seizures or epileptiform discharges are seen on this study.    Rhona Leavensimothy Ulyssa Walthour, MD [facility]

## 2016-05-13 NOTE — Evaluation (Signed)
Clinical/Bedside Swallow Evaluation Patient Details  Name: [name] Bell MRN: [id number] Date of Birth: Nov 15, 1980  Today's Date: 05/13/2016 Time: SLP Start Time (ACUTE ONLY): 1427 SLP Stop Time (ACUTE ONLY): 1440 SLP Time Calculation (min) (ACUTE ONLY): 13 min  Past Medical History: History reviewed. No pertinent past medical history. Past Surgical History:  Past Surgical History  Procedure Laterality Date  . Esophagogastroduodenoscopy N/A 02/10/2016    Procedure: ESOPHAGOGASTRODUODENOSCOPY (EGD);  Surgeon: Sherrilyn RistHenry L Danis III, MD;  [facility];  Service: Endoscopy;  Laterality: N/A;  . Laparotomy N/A 02/10/2016    Procedure: EXPLORATORY LAPAROTOMY, removal of packs,  cauterization of liver, repacking of liver, and open abdomen vac application;  Surgeon: [name] Thompson, MD;  Location: [facility] OR;  Service: General;  Laterality: N/A;  . Laparotomy N/A 02/11/2016    Procedure: EXPLORATORY LAPAROTOMY VAC CHANGE ;  Surgeon: Jimmye NormanJames [name], MD;  [facility];  Service: General;  Laterality: N/A;  . Chest tube insertion Right 02/11/2016    Procedure: CHEST TUBE INSERTION;  Surgeon: Jimmye NormanJames [name], MD;  [facility];  Service: General;  Laterality: Right;  . Laparotomy N/A 02/13/2016    Procedure: EXPLORATORY LAPAROTOMY, REMOVAL OF PACKS, ABDOMINAL VAC DRESSING CHANGE;  Surgeon: [name] Thompson, MD;  Location: [facility] OR;  Service: General;  Laterality: N/A;  . Laparotomy N/A 02/18/2016    Procedure: EXPLORATORY LAPAROTOMY, PLACEMENT OF ABRA ABDOMINAL WALL CLOSURE SET;  Surgeon: Jimmye NormanJames [name], MD;  Location: [facility] OR;  Service: General;  Laterality: N/A;  . Chest tube insertion Right 02/26/2016    Procedure: CHEST TUBE INSERTION;  Surgeon: Kerin PernaPeter Van Trigt, MD;  [facility];  Service: Thoracic;  Laterality: Right;  . Wound debridement N/A 03/01/2016    Procedure: ABDOMINAL WOUND CLOSURE;  Surgeon: Jimmye NormanJames [name], MD;  [facility];  Service: General;  Laterality: N/A;  . Percutaneous tracheostomy N/A 03/01/2016     Procedure: PERCUTANEOUS TRACHEOSTOMY;  Surgeon: Jimmye NormanJames [name], MD;  [facility];  Service: General;  Laterality: N/A;  . Laparotomy N/A 02/09/2016    Procedure: EXPLORATORY LAPAROTOMY;  Surgeon: Jimmye NormanJames [name], MD;  [facility];  Service: General;  Laterality: N/A;  . Cholecystectomy  02/09/2016    Procedure: CHOLECYSTECTOMY;  Surgeon: Jimmye NormanJames [name], MD;  [facility];  Service: General;;  . Bowel resection  02/09/2016    Procedure: SMALL BOWEL RESECTION, MESENTERIC REPAIR;  Surgeon: Jimmye NormanJames [name], MD;  [facility];  Service: General;;  . Application of wound vac  02/09/2016    Procedure: APPLICATION OF WOUND VAC;  Surgeon: Jimmye NormanJames [name], MD;  [facility];  Service: General;;  . Laceration repair  02/09/2016    Procedure: REPAIR LIVER LACERATION;  Surgeon: Jimmye NormanJames [name], MD;  [facility];  Service: General;;  . External fixation pelvis  02/09/2016    Procedure: EXTERNAL FIXATION PELVIS;  Surgeon: Myrene GalasMichael [name], MD;  [facility];  Service: Orthopedics;;  . Laparotomy N/A 02/16/2016    Procedure: EXPLORATORY LAPAROTOMY, ABDOMINAL WASH OUT;  Surgeon: [name] Thompson, MD;  Location: [facility] OR;  Service: General;  Laterality: N/A;  . Application of wound vac N/A 02/16/2016    Procedure: RE-APPLICATION OF WOUND VAC;  Surgeon: [name] Thompson, MD;  Location: [facility] OR;  Service: General;  Laterality: N/A;  . Sacro-iliac pinning Right 02/16/2016    Procedure: Loyal GamblerSACRO-ILIAC PINNING;  Surgeon: Myrene GalasMichael [name], MD;  [facility];  Service: Orthopedics;  Laterality: Right;  . Thoracotomy/lobectomy Right 04/26/2016    Procedure: Right THORACOTOMY AND DRAINAGE OF EMPYEMA;  Surgeon:  Alleen BorneBryan K Bartle, MD;  [facility];  Service: Thoracic;  Laterality: Right;   HPI:  35 [facility].o. [facility] s/p attempted suicide: taped DNR note to chest, drank Drano, then jumped off 5 story building ([address]S/p 02/09/16 gel foam embolization of bil internal iliac arteries.S/p 02/09/16 ex lap with  cholecystectomy, resection SB and mesenteric repair, liver lac repair, application or wound vac.S/p 02/09/16 closed reduction of pelvic ring, external pelvic fixation.Also has right metacarpal fracture in splint. Pt on and off intubation via trach/ and on/off trac collar. Pelvic x- fixator pins removed at bedside on 03/23/16. Underwent thoracotomy 6/19 due to empyema/abcess with 2 chest tubes, one d/c 04/30/16, second d/c 05/05/16 and pt no longer has trach. Pt with no significant PMHx listed in the chart. Pt had acute speech and left sided weakness changes on7/3/17 and both CT and MRI confirmed stroke (R frontoparietal).    Assessment / Plan / Recommendation Clinical Impression  Pt consumed a variety of textures with seemingly appropriate airway protection. She has prolonged mastication and needs liquid washes to clear small bites of regular textures. Oral phase may also be impacted by pt's drowsiness right now. Recommend Dys 3 diet and thin liquids when alert. Will continue to follow for possible advancement.    Aspiration Risk  Mild aspiration risk    Diet Recommendation Dysphagia 3 (Mech soft);Thin liquid   Liquid Administration via: Cup;Straw Medication Administration: Whole meds with puree Supervision: Staff to assist with self feeding;Full supervision/cueing for compensatory strategies Compensations: Minimize environmental distractions;Slow rate;Small sips/bites;Follow solids with liquid Postural Changes: Seated upright at 90 degrees    Other  Recommendations Oral Care Recommendations: Oral care BID   Follow up Recommendations  Inpatient Rehab    Frequency and Duration min 2x/week  2 weeks       Prognosis Prognosis for Safe Diet Advancement: Good      Swallow Study   General HPI: 35 [facility].o. [facility] s/p attempted suicide: taped DNR note to chest, drank Drano, then jumped off [place] ([address]S/p 02/09/16 gel foam embolization of bil  internal iliac arteries.S/p 02/09/16 ex lap with cholecystectomy, resection SB and mesenteric repair, liver lac repair, application or wound vac.S/p 02/09/16 closed reduction of pelvic ring, external pelvic fixation.Also has right metacarpal fracture in splint. Pt on and off intubation via trach/ and on/off trac collar. Pelvic x- fixator pins removed at bedside on 03/23/16. Underwent thoracotomy 6/19 due to empyema/abcess with 2 chest tubes, one d/c 04/30/16, second d/c 05/05/16 and pt no longer has trach. Pt with no significant PMHx listed in the chart. Pt had acute speech and left sided weakness changes on7/3/17 and both CT and MRI confirmed stroke (R frontoparietal).  Type of Study: Bedside Swallow Evaluation Previous Swallow Assessment: BSE 5/26 recommending NPO, pt later progressed to regualr diet and thin liquids, SLP goals d/c prior to new found stroke Diet Prior to this Study: Regular;Thin liquids Temperature Spikes Noted: No Respiratory Status: Nasal cannula History of Recent Intubation: No Behavior/Cognition: Lethargic/Drowsy;Cooperative;Requires cueing Oral Cavity Assessment: Within Functional Limits Oral Care Completed by SLP: No Oral Cavity - Dentition: Adequate natural dentition Self-Feeding Abilities: Needs assist Patient Positioning: Upright in bed Baseline Vocal Quality: Normal Volitional Swallow: Able to elicit    Oral/Motor/Sensory Function     Ice Chips Ice chips: Within functional limits Presentation: Spoon   Thin Liquid Thin Liquid: Within functional limits Presentation: Straw    Nectar Thick Nectar Thick  Liquid: Not tested   Honey Thick Honey Thick Liquid: Not tested   Puree Puree: Within functional limits Presentation: Spoon   Solid   GO   Solid: Impaired Oral Phase Impairments: Impaired mastication;Other (comment) (needs liquid wash to clear)       Maxcine Ham, M.A. CCC-SLP [phone]  Maxcine Ham 05/13/2016,3:05 PM

## 2016-05-13 NOTE — Plan of Care (Signed)
Problem: Health Behavior/Discharge Planning: Goal: Ability to manage health-related needs will improve Outcome: Progressing Pt educated to wear oxygen via [place] to promote oxygen flow to brain following stroke. Pt is compliant.

## 2016-05-13 NOTE — Progress Notes (Signed)
EEG Completed; Results Pending  

## 2016-05-13 NOTE — Evaluation (Signed)
Speech Language Pathology Evaluation Patient Details Name: [name] Bell MRN: [id number] DOB: Dec 06, 1980 Today's Date: 05/13/2016 Time: 1440-1450 SLP Time Calculation (min) (ACUTE ONLY): 10 min  Problem List:  Patient Active Problem List   Diagnosis Date Noted  . Cerebral embolism with cerebral infarction 05/11/2016  . 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 Multiple pelvic fractures (HCC) 03/03/2016  . Left wrist fracture 03/03/2016  . Multiple fractures of ribs of right side 03/03/2016  . Fracture of multiple ribs of left side 03/03/2016  . Liver laceration 03/03/2016  . Small intestine injury 03/03/2016  . Acute blood loss anemia 03/03/2016  . Suicide attempt (HCC) 03/03/2016  . Bilateral pulmonary contusion 03/03/2016  . Acute respiratory failure (HCC) 03/03/2016  . Hypokalemia 03/03/2016  . Hypernatremia 03/03/2016  . Acute kidney injury (HCC) 03/03/2016  . Ingestion of caustic substance 03/03/2016  . Tylenol overdose 03/03/2016  . Hyperglycemia 03/03/2016  . Hepatic failure (HCC) 03/03/2016  . Pressure ulcer 02/26/2016  . Fall from, out of or through building, not otherwise specified, initial encounter 02/09/2016  . Hypovolemic shock (HCC) 02/09/2016   Past Medical History: History reviewed. No pertinent past medical history. Past Surgical History:  Past Surgical History  Procedure Laterality Date  . Esophagogastroduodenoscopy N/A 02/10/2016    Procedure: ESOPHAGOGASTRODUODENOSCOPY (EGD);  Surgeon: Sherrilyn RistHenry L Danis III, MD;  [facility];  Service: Endoscopy;  Laterality: N/A;  . Laparotomy N/A 02/10/2016    Procedure: EXPLORATORY LAPAROTOMY, removal of packs,  cauterization of liver, repacking of liver, and open abdomen vac application;  Surgeon: [name] Thompson, MD;  Location: [facility] OR;  Service: General;  Laterality: N/A;  . Laparotomy N/A 02/11/2016    Procedure: EXPLORATORY LAPAROTOMY VAC CHANGE ;  Surgeon: Jimmye NormanJames  [name], MD;  [facility];  Service: General;  Laterality: N/A;  . Chest tube insertion Right 02/11/2016    Procedure: CHEST TUBE INSERTION;  Surgeon: Jimmye NormanJames [name], MD;  [facility];  Service: General;  Laterality: Right;  . Laparotomy N/A 02/13/2016    Procedure: EXPLORATORY LAPAROTOMY, REMOVAL OF PACKS, ABDOMINAL VAC DRESSING CHANGE;  Surgeon: [name] Thompson, MD;  Location: [facility] OR;  Service: General;  Laterality: N/A;  . Laparotomy N/A 02/18/2016    Procedure: EXPLORATORY LAPAROTOMY, PLACEMENT OF ABRA ABDOMINAL WALL CLOSURE SET;  Surgeon: Jimmye NormanJames [name], MD;  Location: [facility] OR;  Service: General;  Laterality: N/A;  . Chest tube insertion Right 02/26/2016    Procedure: CHEST TUBE INSERTION;  Surgeon: Kerin PernaPeter Van Trigt, MD;  [facility];  Service: Thoracic;  Laterality: Right;  . Wound debridement N/A 03/01/2016    Procedure: ABDOMINAL WOUND CLOSURE;  Surgeon: Jimmye NormanJames [name], MD;  [facility];  Service: General;  Laterality: N/A;  . Percutaneous tracheostomy N/A 03/01/2016    Procedure: PERCUTANEOUS TRACHEOSTOMY;  Surgeon: Jimmye NormanJames [name], MD;  [facility];  Service: General;  Laterality: N/A;  . Laparotomy N/A 02/09/2016    Procedure: EXPLORATORY LAPAROTOMY;  Surgeon: Jimmye NormanJames [name], MD;  [facility];  Service: General;  Laterality: N/A;  . Cholecystectomy  02/09/2016    Procedure: CHOLECYSTECTOMY;  Surgeon: Jimmye NormanJames [name], MD;  [facility];  Service: General;;  . Bowel resection  02/09/2016    Procedure: SMALL BOWEL RESECTION, MESENTERIC REPAIR;  Surgeon: Jimmye NormanJames [name], MD;  [facility];  Service: General;;  . Application of wound vac  02/09/2016    Procedure: APPLICATION OF WOUND VAC;  Surgeon: Jimmye NormanJames [name], MD;  [facility]: [facility] OR;  Service: General;;  .  Laceration repair  02/09/2016    Procedure: REPAIR LIVER LACERATION;  Surgeon: Jimmye NormanJames [name], MD;  [facility];  Service: General;;  . External fixation pelvis  02/09/2016    Procedure: EXTERNAL FIXATION PELVIS;  Surgeon: Myrene GalasMichael [name], MD;  [facility];  Service: Orthopedics;;  . Laparotomy N/A 02/16/2016    Procedure: EXPLORATORY LAPAROTOMY, ABDOMINAL WASH OUT;  Surgeon: [name] Thompson, MD;  Location: [facility] OR;  Service: General;  Laterality: N/A;  . Application of wound vac N/A 02/16/2016    Procedure: RE-APPLICATION OF WOUND VAC;  Surgeon: [name] Thompson, MD;  Location: [facility] OR;  Service: General;  Laterality: N/A;  . Sacro-iliac pinning Right 02/16/2016    Procedure: Loyal GamblerSACRO-ILIAC PINNING;  Surgeon: Myrene GalasMichael [name], MD;  [facility];  Service: Orthopedics;  Laterality: Right;  . Thoracotomy/lobectomy Right 04/26/2016    Procedure: Right THORACOTOMY AND DRAINAGE OF EMPYEMA;  Surgeon: Alleen BorneBryan K Bartle, MD;  Location: [facility] OR;  Service: Thoracic;  Laterality: Right;   HPI:  35 [facility]. [facility] s/p attempted suicide: taped DNR note to chest, drank Drano, then jumped [place] 5 [place] ([address]S/p 02/09/16 gel foam embolization of bil internal iliac arteries.S/p 02/09/16 ex lap with cholecystectomy, resection SB and mesenteric repair, liver lac repair, application or wound vac.S/p 02/09/16 closed reduction of pelvic ring, external pelvic fixation.Also has right metacarpal fracture in splint. Pt on and off intubation via trach/ and on/off trac collar. Pelvic x- fixator pins removed at bedside on 03/23/16. Underwent thoracotomy 6/19 due to empyema/abcess with 2 chest tubes, one d/c 04/30/16, second d/c 05/05/16 and pt no longer has trach. Pt with no significant PMHx listed in the chart. Pt had acute speech and left sided weakness changes on7/3/17 and both CT and MRI confirmed stroke (R frontoparietal).    Assessment / Plan / Recommendation Clinical Impression  Pt is drowsy but participative in therapy evaluation. Verbal output is primarily limited to one- to two-word phrases, although when prompted she will produce longer utterances. Despite cues to form complete sentences, speech is very telegraphic in nature. Confrontational  naming WFL. Would not anticipate a true aphasia given location of infarct, but perhaps this may be rooted in cognitive impairment with decreased thought organization. Will continue to f/u to maximize functional cognition and communication.    SLP Assessment  Patient needs continued Speech Lanaguage Pathology Services    Follow Up Recommendations  Inpatient Rehab    Frequency and Duration min 2x/week  2 weeks      SLP Evaluation Prior Functioning  Cognitive/Linguistic Baseline: Within functional limits   Cognition  Overall Cognitive Status: Impaired/Different from baseline Arousal/Alertness: Lethargic Orientation Level: Oriented to person;Oriented to place;Oriented to time Attention: Sustained Sustained Attention: Impaired Sustained Attention Impairment: Functional basic Problem Solving: Impaired Problem Solving Impairment: Functional basic    Comprehension  Auditory Comprehension Overall Auditory Comprehension: Impaired Commands: Impaired One Step Basic Commands: 75-100% accurate Conversation: Simple    Expression Expression Primary Mode of Expression: Verbal Verbal Expression Overall Verbal Expression: Impaired Level of Generative/Spontaneous Verbalization: Phrase Naming: No impairment Pragmatics: Impairment Impairments: Abnormal affect;Eye contact;Monotone Non-Verbal Means of Communication: Not applicable   Oral / Motor      GO                   [name] Bell, M.A. CCC-SLP [phone]  [name] Bell, [name] Bell 05/13/2016, 3:14 PM

## 2016-05-13 NOTE — Progress Notes (Signed)
Subjective: No further seizure activity during night. Preparing to leave room for TEE  Exam: Filed Vitals:   05/13/16 0520 05/13/16 0731  BP: 118/82 122/82  Pulse: 85 85  Temp: 97.1 F (36.2 C) 98.5 F (36.9 C)  Resp: 21 18    HEENT-  Normocephalic, no lesions, without obvious abnormality.  Normal external eye and conjunctiva.  Normal TM's bilaterally.  Normal auditory canals and external ears. Normal external nose, mucus membranes and septum.  Normal pharynx. Cardiovascular- regular rate and rhythm and S1, S2 normal, pulses palpable throughout   Lungs- no tachypnea, retractions or cyanosis, BS diminished right side Abdomen- normal findings: bowel sounds normal Extremities- less then 2 second capillary refill and no edema Lymph-no adenopathy palpable Musculoskeletal-no joint tenderness, deformity or swelling Skin-dry    Gen: In bed, NAD MS: Alert, cooperative, follow commands, flat affect, speech slow, no aphasia noted CN: II -XII intact Motor: Upper extremity 5/5Left: Upper extremity 0/5 Lower extremity 5/5Lower extremity 0/5 Tone and bulk:normal tone throughout; no atrophy noted Sensory: intact and symmetric bilaterally DTR: 2+ and symmetric throughout  Pertinent Labs/Diagnostics: None  April Pugh, MSN, RN Neurology [phone]  Impression: 35 yo with new onset seizures started on Vimpat 100mg  bid.  No further seizures noted per staff.  EEG pending.  Has recurrent empyema, and is scheduled for TEE and CTA chest for this morning.  Recommendations: 1) Continue Vimpat. Seizure precautions. Defer further management to Stroke team.     05/13/2016, 9:21 AM   Neurology Attending Addendum:  This patient was seen, examined, and d/w NP. I have reviewed the note and agree with the findings, assessment and plan as documented. No further seizures. Stroke team is  following since primary neurologic issues are stroke and its sequelae.

## 2016-05-13 NOTE — Progress Notes (Addendum)
      [address]       [place] [id number]             [phone]      17 Days Post-Op Procedure(s) (LRB): Right THORACOTOMY AND DRAINAGE OF EMPYEMA (Right)   Subjective:   Seizures overnight, for EEG today.  Continues to have paralysis of left side  Objective: Vital signs in last 24 hours: Temp:  [97.1 F (36.2 C)-98.6 F (37 C)] 98.5 F (36.9 C) (07/06 0731) Pulse Rate:  [85-114] 85 (07/06 0731) Cardiac Rhythm:  [-] Normal sinus rhythm (07/06 0806) Resp:  [18-21] 18 (07/06 0731) BP: (105-134)/(62-82) 122/82 mmHg (07/06 0731) SpO2:  [100 %] 100 % (07/06 0731)  Intake/Output from previous day: 07/05 0701 - 07/06 0700 In: 240 [P.O.:240] Out: -   General appearance: alert, cooperative and no distress Heart: regular rate and rhythm Lungs: diminished on right Wound: packed  Lab Results:  Recent Labs  05/10/16 1143 05/11/16 1108  WBC 13.8* 9.9  HGB 8.8* 8.6*  HCT 28.8* 28.0*  PLT 250 248   BMET:  Recent Labs  05/11/16 1108  NA 134*  K 4.4  CL 102  CO2 23  GLUCOSE 105*  BUN 18  CREATININE 1.46*  CALCIUM 9.4    PT/INR: No results for input(s): LABPROT, INR in the last 72 hours. ABG    Component Value Date/Time   PHART 7.508* 02/28/2016 0642   HCO3 25.5* 02/28/2016 0642   TCO2 26.5 02/28/2016 0642   ACIDBASEDEF 2.0 02/13/2016 0411   O2SAT 99.2 02/28/2016 0642   CBG (last 3)  No results for input(s): GLUCAP in the last 72 hours.  Assessment/Plan: S/P Procedure(s) (LRB): Right THORACOTOMY AND DRAINAGE OF EMPYEMA (Right)  1. Reaccumulation of pleural fluid on right- CT scan later today for further assessment 2. Neuro- Recent Stroke, Seizures overnight... Neurology following 3. ID- continue wound packing 4. Dispo- care per trauma   LOS: 94 days    BARRETT, ERIN 05/13/2016   Chart reviewed, patient examined, agree with above. CT scan of the chest shows reaccumulation of a large posterior heterogeneous hydro-pneumothorax on  the right side. I suspect that she bled into the pleural space as she did before. Her Hgb did drop significantly over the past week. I think the best option is to drain this in the OR. I don't know if I can do this with a VATS or if I will need to go through her previous thoracotomy. If this is not drained it will form another abscess and continue to compress her right lung which is already somewhat trapped anteriorly by the previous inflammatory process. She has has a right brain stroke with hemorrhagic transformation and seizure last pm. Will plan to do TEE in the OR while asleep to assess for intracardiac clot or vegetation as a source of her stroke. I discussed the procedure with her and her sister Naval architect( HCPOA) by telephone including alternatives, benefits and risks including but not limited to bleeding, blood transfusion, infection, recurrent pleural effusion, esophageal injury and they said that they understand and agree to proceed.

## 2016-05-13 NOTE — Progress Notes (Signed)
SLP Cancellation Note  Patient Details Name: [name] Bell MRN: [id number] DOB: 03/04/1981   Cancelled treatment:       Reason Eval/Treat Not Completed: Patient at procedure or test/unavailable. Pt off the floor for EEG/CT. Per RN, plans for TEE are now on hold for today. Will try to f/u this afternoon for swallow and cognitive evaluations.   [name] Bell, [name] Bell [phone]  [name] Bell, [name] Bell 05/13/2016, 10:27 AM

## 2016-05-13 NOTE — Progress Notes (Signed)
STROKE TEAM PROGRESS NOTE   SUBJECTIVE (INTERVAL HISTORY) Patient had new onset seizure over night. Received ativan and vimpat. No seizure since then. Sister at bedside, asking about why pt had a seizure and a stroke. I answered all her questions. Sitter also at bedside. RN joined in rounds. CT head and chest pending. TEE postponed to tomorrow.   OBJECTIVE Temp:  [97.1 F (36.2 C)-98.6 F (37 C)] 98.5 F (36.9 C) (07/06 0731) Pulse Rate:  [85-114] 85 (07/06 0731) Cardiac Rhythm:  [-] Normal sinus rhythm (07/06 0806) Resp:  [18-21] 18 (07/06 0731) BP: (105-134)/(62-82) 122/82 mmHg (07/06 0731) SpO2:  [100 %] 100 % (07/06 0731)  CBC:   Recent Labs Lab 05/10/16 1143 05/11/16 1108  WBC 13.8* 9.9  HGB 8.8* 8.6*  HCT 28.8* 28.0*  MCV 89.7 89.2  PLT 250 248    Basic Metabolic Panel:   Recent Labs Lab 05/11/16 1108  NA 134*  K 4.4  CL 102  CO2 23  GLUCOSE 105*  BUN 18  CREATININE 1.46*  CALCIUM 9.4    Lipid Panel:     Component Value Date/Time   CHOL 180 05/11/2016 0518   TRIG 108 05/11/2016 0518   HDL 46 05/11/2016 0518   CHOLHDL 3.9 05/11/2016 0518   VLDL 22 05/11/2016 0518   LDLCALC 112* 05/11/2016 0518   HgbA1c:  Lab Results  Component Value Date   HGBA1C 5.4 05/11/2016     IMAGING I have personally reviewed the radiological images below and agree with the radiology interpretations.  Dg Chest 2 View 05/10/2016  Enlarging loculated right pleural fluid collection, possibly reaccumulation of an empyema. Probable airspace opacification in the right lung. Electronically Signed   By: Leanna Battles M.D.   On: 05/10/2016 16:00   Ct Head Wo Contrast 05/10/2016  SUBTLE AREA OF LOW DENSITY AND LOSS OF GRAY-WHITE DIFFERENTIATION IN THE ANTERIOR RIGHT FRONTAL LOBE COMPATIBLE WITH ACUTE INFARCTION.  02/10/2016 No acute intracranial abnormality or acute abnormality of the cervical spine. Sinus disease is likely related to intubation and OG tube placement. The patient's  OG tube is looped in the pharynx and mouth.  Mr Brain Wo Contrast 05/10/2016  Subacute to chronic appearing RIGHT frontoparietal lobe infarct in RIGHT MCA distribution with petechial hemorrhage. Findings less likely represent venous infarct. 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   Ct Angio head & Neck W Or Wo Contrast 05/11/2016   Progression of cytotoxic edema compared with the initial CT from 05/10/2016 in the RIGHT frontal lobe. Moderate irregularity of the A3 and A4 segments, anterior cerebral artery on the RIGHT, correlates with the observed pattern of restricted diffusion on MR; considerations would include vasculitis, intracranial atherosclerosis, or vasospasm. No intracranial large vessel occlusion or extracranial stenosis of significance. No findings to support cortical venous or dural venous thrombosis. Suspected recurrent empyema in the RIGHT chest, 9 x 10 cm cross-section with air-fluid level. Recommend CT chest with contrast for further evaluation.   2D Echocardiogram  02/25/2016 - Left ventricle: The cavity size was normal. Systolic function was normal. The estimated ejection fraction was in the range of 60% to 65%. Wall motion was normal; there were no regional wall motion abnormalities. Left ventricular diastolic function parameters were normal. - Aortic valve: Trileaflet; mildly thickened leaflets. Valve area (VTI): 1.91 cm^2. Valve area (Vmax): 2.11 cm^2. Valve area (Vmean): 2.14 cm^2. - Right atrium: Mobile linear density seen in the right atrium.  This most likely  represents a catheter. There is a bright target off of this that is highly mobile and concerning for possible thrombus or vegetation. This is not seen in all views. Recommend TEE for further evaluation is clinically indicated. - Pulmonary arteries: PA peak pressure: 39 mm Hg (S). Impressions:   The right ventricular systolic pressure was  increased consistent with mild pulmonary hypertension.  TCD bubble study - negative for PFO  LE venous doppler - negative for DVT   Ct Head Wo Contrast 05/13/2016  IMPRESSION: Evolution of the right frontal lobe infarct with a new area of hemorrhage within this infarct. No hemorrhage seen elsewhere. No new infarct seen elsewhere. No midline shift. No subdural or epidural fluid collections.   EEG pending  TEE pending   PHYSICAL EXAM General - Well nourished, well developed, in no apparent distress.  Ophthalmologic - Fundi not visualized due to noncooperation.  Cardiovascular - Regular rate and rhythm.  Mental Status -  Level of arousal and orientation to time, place, and person were intact. Language including expression, naming, repetition, comprehension was assessed and found intact, however, significant scanning speech and bradyphasia. Fund of Knowledge was assessed and was intact.  Cranial Nerves II - XII - II - Visual field intact OU. III, IV, VI - Extraocular movements intact. V - Facial sensation intact bilaterally except subjective right V2 decreased sensation about 80% of the left. VII - Facial movement intact bilaterally. VIII - Hearing & vestibular intact bilaterally. X - Palate elevates symmetrically. XI - Chin turning & shoulder shrug intact bilaterally XII - Tongue protrusion intact.  Motor Strength - The patient's strength was 5/5 RUE and RLE, but 0/5 LUE and LLE.  Bulk was normal and fasciculations were absent.   Motor Tone - Muscle tone was assessed at the neck and appendages and was normal.  Reflexes - The patient's reflexes were 1+ in all extremities and she had no pathological reflexes.  Sensory - Light touch, temperature/pinprick were assessed and were subjectively decreased on the right UE and LE, about 90% of the left    Coordination - The patient had normal movements in the right hand with no ataxia or dysmetria.  Tremor was absent.  Gait and Station -  not tested due to weakness.   ASSESSMENT/PLAN [name] Bell is a 35 y.o. female admitted 3 mos ago after drinking drano and jumping from a 5th floor window who developed aphasia in the hospital with decreased movements L side. She did not receive IV t-PA due to unknown LKW, recent trauma.   Stroke with hemorrhagic transformation - R ACA and MCA/ACA acute infarct with hemorrhagic transformation, embolic secondary to unknown etiology.  Resultant  Left hemiplegia  MRI  R ACA and MCA/ACA infarct with petechial hemorrhage. Severe diffuse axonal injury.  CTA head & neck  Irregular R ACA A3-A4 segments, no carotid dissection  Repeat CT head showed right frontal hemorrhagic transformation  Will repeat CT head in am  LE venous Doppler negative for DVT  TCD bubble study pending negative for PFO  2D Echo in 02/2016 showed R atrium bright target, highly mobile, concerning for possible thrombus/vegetation - recommended TEE at that time - will plan for TEE tomorrow.  LDL 112  HgbA1c 5.4  Hypercoagulable labls pending   Lovenox 30 mg sq daily for VTE prophylaxis Diet regular Room service appropriate?: Yes; Fluid consistency:: Thin  No antithrombotic prior to admission, now on aspirin 325 mg daily. Will hold off ASA for now due to hemorrhagic transformation,  and also will d/c naproxen bid and change lovenox from 30mg  bid to 40mg  daily.   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  Other Active Problems  Trauma s/p suicidal attempt and fall  Suicidal attempt  Depression on zoloft  R empyema, chest wall wound - for CT chest to assess  Hospital day # 94  The patient had seizure overnight and CT in am showed hemorrhagic transformation, which likely to be the cause of seizure. She is on vimpat and will repeat CT in am. BP goal < 160. TEE will be done tomorrow. I had discussion with sister at bedside. Answered all her questions. Pt is at risk for hematoma expansion, recurrent strokes, status epilepticus and neurological worsening and she needs ongoing stroke monitoring and aggressive rehab.   Marvel PlanJindong Aldahir Litaker, MD PhD Stroke Neurology 05/13/2016 9:52 AM      To contact Stroke Continuity provider, please refer to [email or web address]. After hours, contact General Neurology

## 2016-05-14 ENCOUNTER — Inpatient Hospital Stay (HOSPITAL_COMMUNITY): Payer: Medicaid Other

## 2016-05-14 ENCOUNTER — Encounter (HOSPITAL_COMMUNITY): Admission: EM | Disposition: A | Discharge status: Rehabilitation Facility | Payer: Self-pay | Source: Home / Self Care

## 2016-05-14 ENCOUNTER — Inpatient Hospital Stay (HOSPITAL_COMMUNITY): Payer: Medicaid Other | Admitting: Critical Care Medicine

## 2016-05-14 DIAGNOSIS — Z9889 Other specified postprocedural states: Secondary | ICD-10-CM

## 2016-05-14 DIAGNOSIS — J95811 Postprocedural pneumothorax: Secondary | ICD-10-CM

## 2016-05-14 HISTORY — PX: VIDEO ASSISTED THORACOSCOPY (VATS)/THOROCOTOMY: SHX6173

## 2016-05-14 HISTORY — PX: TEE WITHOUT CARDIOVERSION: SHX5443

## 2016-05-14 HISTORY — PX: HEMATOMA EVACUATION: SHX5118

## 2016-05-14 LAB — BASIC METABOLIC PANEL
Anion gap: 8 (ref 5–15)
BUN: 14 mg/dL (ref 6–20)
CALCIUM: 9.7 mg/dL (ref 8.9–10.3)
CO2: 24 mmol/L (ref 22–32)
Chloride: 102 mmol/L (ref 101–111)
Creatinine, Ser: 1.28 mg/dL — ABNORMAL HIGH (ref 0.44–1.00)
GFR calc Af Amer: >60 mL/min (ref >60)
GFR, EST NON AFRICAN AMERICAN: 55 mL/min — AB (ref >60)
GLUCOSE: 97 mg/dL (ref 65–99)
POTASSIUM: 5.7 mmol/L — AB (ref 3.5–5.1)
Sodium: 134 mmol/L — ABNORMAL LOW (ref 135–145)

## 2016-05-14 LAB — CBC
HCT: 25.4 % — ABNORMAL LOW (ref 36.0–46.0)
HEMOGLOBIN: 7.8 g/dL — AB (ref 12.0–15.0)
MCH: 27.4 pg (ref 26.0–34.0)
MCHC: 30.7 g/dL (ref 30.0–36.0)
MCV: 89.1 fL (ref 78.0–100.0)
PLATELETS: 343 10*3/uL (ref 150–400)
RBC: 2.85 MIL/uL — AB (ref 3.87–5.11)
RDW: 15.5 % (ref 11.5–15.5)
WBC: 12 10*3/uL — AB (ref 4.0–10.5)

## 2016-05-14 LAB — PREPARE RBC (CROSSMATCH)

## 2016-05-14 LAB — ALPHA GALACTOSIDASE: ALPHA GALACTOSIDASE, SERUM: 67.9 nmol/h/mg{prot} (ref 28.0–80.0)

## 2016-05-14 LAB — GLUCOSE, CAPILLARY
GLUCOSE-CAPILLARY: 146 mg/dL — AB (ref 65–99)
Glucose-Capillary: 133 mg/dL — ABNORMAL HIGH (ref 65–99)
Glucose-Capillary: 129 mg/dL — ABNORMAL HIGH (ref 65–99)

## 2016-05-14 LAB — PTT-LA MIX: PTT-LA MIX: 59.5 s — AB (ref 0.0–48.9)

## 2016-05-14 LAB — HEXAGONAL PHASE PHOSPHOLIPID: HEXAGONAL PHASE PHOSPHOLIPID: 12 s — AB (ref 0–11)

## 2016-05-14 LAB — LUPUS ANTICOAGULANT PANEL
DRVVT: 68.7 s — ABNORMAL HIGH (ref 0.0–47.0)
PTT Lupus Anticoagulant: 67.3 s — ABNORMAL HIGH (ref 0.0–51.9)

## 2016-05-14 LAB — DRVVT MIX: dRVVT Mix: 49.7 s — ABNORMAL HIGH (ref 0.0–47.0)

## 2016-05-14 LAB — DRVVT CONFIRM: dRVVT Confirm: 1.2 ratio (ref 0.8–1.2)

## 2016-05-14 SURGERY — ECHOCARDIOGRAM, TRANSESOPHAGEAL
Anesthesia: Monitor Anesthesia Care

## 2016-05-14 SURGERY — VIDEO ASSISTED THORACOSCOPY (VATS)/THOROCOTOMY
Anesthesia: General | Site: Chest | Laterality: Right

## 2016-05-14 MED ORDER — MIDAZOLAM HCL 2 MG/2ML IJ SOLN
INTRAMUSCULAR | Status: AC
  Filled 2016-05-14: qty 2

## 2016-05-14 MED ORDER — INSULIN ASPART 100 UNIT/ML ~~LOC~~ SOLN
0.0000 [IU] | SUBCUTANEOUS | Status: DC
  Administered 2016-05-14 – 2016-05-16 (×5): 2 [IU] via SUBCUTANEOUS

## 2016-05-14 MED ORDER — FENTANYL CITRATE (PF) 100 MCG/2ML IJ SOLN
INTRAMUSCULAR | Status: DC | PRN
  Administered 2016-05-14 (×7): 50 ug via INTRAVENOUS

## 2016-05-14 MED ORDER — LEVALBUTEROL HCL 0.63 MG/3ML IN NEBU
0.6300 mg | INHALATION_SOLUTION | Freq: Four times a day (QID) | RESPIRATORY_TRACT | Status: DC
Start: 2016-05-14 — End: 2016-05-15
  Administered 2016-05-14 (×2): 0.63 mg via RESPIRATORY_TRACT
  Filled 2016-05-14 (×2): qty 3

## 2016-05-14 MED ORDER — DEXAMETHASONE SODIUM PHOSPHATE 10 MG/ML IJ SOLN
INTRAMUSCULAR | Status: DC | PRN
  Administered 2016-05-14: 10 mg via INTRAVENOUS

## 2016-05-14 MED ORDER — ROCURONIUM BROMIDE 100 MG/10ML IV SOLN
INTRAVENOUS | Status: DC | PRN
  Administered 2016-05-14 (×2): 50 mg via INTRAVENOUS

## 2016-05-14 MED ORDER — POTASSIUM CHLORIDE 10 MEQ/50ML IV SOLN
10.0000 meq | Freq: Every day | INTRAVENOUS | Status: DC | PRN
Start: 2016-05-14 — End: 2016-06-03

## 2016-05-14 MED ORDER — PROMETHAZINE HCL 25 MG/ML IJ SOLN: 6.2500 mg | INTRAMUSCULAR | Status: DC | PRN

## 2016-05-14 MED ORDER — ONDANSETRON HCL 4 MG/2ML IJ SOLN: 4.0000 mg | Freq: Four times a day (QID) | INTRAMUSCULAR | Status: DC | PRN

## 2016-05-14 MED ORDER — DEXTROSE 5 % IV SOLN
10.0000 mg | INTRAVENOUS | Status: DC | PRN
  Administered 2016-05-14: 20 ug/min via INTRAVENOUS

## 2016-05-14 MED ORDER — SENNOSIDES-DOCUSATE SODIUM 8.6-50 MG PO TABS
1.0000 | ORAL_TABLET | Freq: Every day | ORAL | Status: DC
  Administered 2016-05-14 – 2016-05-26 (×9): 1 via ORAL
  Filled 2016-05-14 (×15): qty 1

## 2016-05-14 MED ORDER — LACTATED RINGERS IV SOLN
INTRAVENOUS | Status: DC | PRN
  Administered 2016-05-14 (×2): via INTRAVENOUS

## 2016-05-14 MED ORDER — PROPOFOL 10 MG/ML IV BOLUS
INTRAVENOUS | Status: AC
  Filled 2016-05-14: qty 20

## 2016-05-14 MED ORDER — PROPOFOL 10 MG/ML IV BOLUS
INTRAVENOUS | Status: DC | PRN
  Administered 2016-05-14: 140 mg via INTRAVENOUS

## 2016-05-14 MED ORDER — FENTANYL CITRATE (PF) 250 MCG/5ML IJ SOLN
INTRAMUSCULAR | Status: AC
  Filled 2016-05-14: qty 5

## 2016-05-14 MED ORDER — SODIUM CHLORIDE 0.9% FLUSH
10.0000 mL | Freq: Two times a day (BID) | INTRAVENOUS | Status: DC
  Administered 2016-05-14: 10 mL
  Administered 2016-05-15: 20 mL
  Administered 2016-05-16: 10 mL
  Administered 2016-05-16: 20 mL

## 2016-05-14 MED ORDER — MIDAZOLAM HCL 5 MG/5ML IJ SOLN
INTRAMUSCULAR | Status: DC | PRN
  Administered 2016-05-14: 1 mg via INTRAVENOUS

## 2016-05-14 MED ORDER — SUGAMMADEX SODIUM 200 MG/2ML IV SOLN
INTRAVENOUS | Status: DC | PRN
  Administered 2016-05-14: 200 mg via INTRAVENOUS

## 2016-05-14 MED ORDER — 0.9 % SODIUM CHLORIDE (POUR BTL) OPTIME
TOPICAL | Status: DC | PRN
  Administered 2016-05-14 (×2): 2000 mL

## 2016-05-14 MED ORDER — SODIUM CHLORIDE 0.9% FLUSH
10.0000 mL | INTRAVENOUS | Status: DC | PRN
  Administered 2016-05-15: 10 mL
  Filled 2016-05-14: qty 40

## 2016-05-14 MED ORDER — BACITRACIN ZINC 500 UNIT/GM EX OINT
TOPICAL_OINTMENT | CUTANEOUS | Status: AC
  Filled 2016-05-14: qty 28.35

## 2016-05-14 MED ORDER — ONDANSETRON HCL 4 MG/2ML IJ SOLN
INTRAMUSCULAR | Status: DC | PRN
  Administered 2016-05-14: 4 mg via INTRAVENOUS

## 2016-05-14 MED ORDER — HYDROMORPHONE HCL 1 MG/ML IJ SOLN: 0.2500 mg | INTRAMUSCULAR | Status: DC | PRN

## 2016-05-14 MED ORDER — DEXTROSE 5 % IV SOLN
1.5000 g | Freq: Two times a day (BID) | INTRAVENOUS | Status: AC
  Administered 2016-05-14 – 2016-05-15 (×2): 1.5 g via INTRAVENOUS
  Filled 2016-05-14 (×2): qty 1.5

## 2016-05-14 MED ORDER — SODIUM CHLORIDE 0.9 % IV SOLN
Freq: Once | INTRAVENOUS | Status: DC
Start: 2016-05-14 — End: 2016-05-14

## 2016-05-14 MED ORDER — LIDOCAINE HCL (CARDIAC) 20 MG/ML IV SOLN
INTRAVENOUS | Status: DC | PRN
  Administered 2016-05-14: 80 mg via INTRAVENOUS

## 2016-05-14 MED ORDER — MORPHINE SULFATE (PF) 4 MG/ML IV SOLN
4.0000 mg | INTRAVENOUS | Status: DC | PRN
  Administered 2016-05-14 – 2016-05-15 (×3): 4 mg via INTRAVENOUS
  Administered 2016-05-15: 2 mg via INTRAVENOUS
  Filled 2016-05-14 (×4): qty 1

## 2016-05-14 MED ORDER — SODIUM CHLORIDE 0.9 % IV SOLN: 10.0000 mL/h | Freq: Once | INTRAVENOUS | Status: DC

## 2016-05-14 MED ORDER — DEXTROSE-NACL 5-0.9 % IV SOLN
INTRAVENOUS | Status: DC
  Administered 2016-05-14: 100 mL via INTRAVENOUS
  Administered 2016-05-15 – 2016-05-16 (×2): via INTRAVENOUS

## 2016-05-14 MED ORDER — BISACODYL 5 MG PO TBEC
10.0000 mg | DELAYED_RELEASE_TABLET | Freq: Every day | ORAL | Status: DC
  Administered 2016-05-15 – 2016-05-31 (×8): 10 mg via ORAL
  Filled 2016-05-14 (×18): qty 2

## 2016-05-14 SURGICAL SUPPLY — 68 items
APPLICATOR TIP EXT COSEAL (VASCULAR PRODUCTS) IMPLANT
CANISTER SUCTION 2500CC (MISCELLANEOUS) ×10 IMPLANT
CATH KIT ON Q 5IN SLV (PAIN MANAGEMENT) IMPLANT
CATH THORACIC 28FR (CATHETERS) IMPLANT
CATH THORACIC 36FR (CATHETERS) IMPLANT
CATH THORACIC 36FR RT ANG (CATHETERS) IMPLANT
CATH THORACIC 40FR (CATHETERS) ×5 IMPLANT
CLIP TI MEDIUM 6 (CLIP) ×5 IMPLANT
CONT SPEC 4OZ CLIKSEAL STRL BL (MISCELLANEOUS) ×10 IMPLANT
COVER SURGICAL LIGHT HANDLE (MISCELLANEOUS) ×5 IMPLANT
DERMABOND ADHESIVE PROPEN (GAUZE/BANDAGES/DRESSINGS) ×2
DERMABOND ADVANCED (GAUZE/BANDAGES/DRESSINGS)
DERMABOND ADVANCED .7 DNX12 (GAUZE/BANDAGES/DRESSINGS) IMPLANT
DERMABOND ADVANCED .7 DNX6 (GAUZE/BANDAGES/DRESSINGS) ×3 IMPLANT
DRAPE LAPAROSCOPIC ABDOMINAL (DRAPES) ×5 IMPLANT
DRAPE WARM FLUID 44X44 (DRAPE) ×10 IMPLANT
DRSG COVADERM 4X8 (GAUZE/BANDAGES/DRESSINGS) ×5 IMPLANT
ELECT BLADE 4.0 EZ CLEAN MEGAD (MISCELLANEOUS) ×5
ELECT REM PT RETURN 9FT ADLT (ELECTROSURGICAL) ×5
ELECTRODE BLDE 4.0 EZ CLN MEGD (MISCELLANEOUS) ×3 IMPLANT
ELECTRODE REM PT RTRN 9FT ADLT (ELECTROSURGICAL) ×3 IMPLANT
GAUZE IODOFORM PACK 1/2 7832 (GAUZE/BANDAGES/DRESSINGS) ×5 IMPLANT
GAUZE PACKING IODOFORM 1X5 (MISCELLANEOUS) ×5 IMPLANT
GAUZE SPONGE 4X4 12PLY STRL (GAUZE/BANDAGES/DRESSINGS) ×5 IMPLANT
GLOVE EUDERMIC 7 POWDERFREE (GLOVE) ×10 IMPLANT
GOWN STRL REUS W/ TWL LRG LVL3 (GOWN DISPOSABLE) ×6 IMPLANT
GOWN STRL REUS W/ TWL XL LVL3 (GOWN DISPOSABLE) ×3 IMPLANT
GOWN STRL REUS W/TWL LRG LVL3 (GOWN DISPOSABLE) ×4
GOWN STRL REUS W/TWL XL LVL3 (GOWN DISPOSABLE) ×2
HEMOSTAT SURGICEL 2X14 (HEMOSTASIS) IMPLANT
KIT BASIN OR (CUSTOM PROCEDURE TRAY) ×5 IMPLANT
KIT ROOM TURNOVER OR (KITS) ×5 IMPLANT
KIT SUCTION CATH 14FR (SUCTIONS) ×5 IMPLANT
NS IRRIG 1000ML POUR BTL (IV SOLUTION) ×20 IMPLANT
PACK CHEST (CUSTOM PROCEDURE TRAY) ×5 IMPLANT
PAD ARMBOARD 7.5X6 YLW CONV (MISCELLANEOUS) ×10 IMPLANT
SEALANT SURG COSEAL 4ML (VASCULAR PRODUCTS) IMPLANT
SEALANT SURG COSEAL 8ML (VASCULAR PRODUCTS) IMPLANT
SOLUTION ANTI FOG 6CC (MISCELLANEOUS) ×5 IMPLANT
SPONGE GAUZE 4X4 12PLY STER LF (GAUZE/BANDAGES/DRESSINGS) ×5 IMPLANT
SPONGE LAP 18X18 X RAY DECT (DISPOSABLE) ×10 IMPLANT
STAPLER VISISTAT 35W (STAPLE) ×5 IMPLANT
SUT PROLENE 3 0 SH DA (SUTURE) IMPLANT
SUT PROLENE 4 0 RB 1 (SUTURE)
SUT PROLENE 4-0 RB1 .5 CRCL 36 (SUTURE) IMPLANT
SUT SILK  1 MH (SUTURE) ×2
SUT SILK 1 MH (SUTURE) ×3 IMPLANT
SUT SILK 1 TIES 10X30 (SUTURE) ×5 IMPLANT
SUT SILK 2 0 SH (SUTURE) IMPLANT
SUT SILK 2 0SH CR/8 30 (SUTURE) IMPLANT
SUT VIC AB 1 CTX 18 (SUTURE) ×5 IMPLANT
SUT VIC AB 1 CTX 36 (SUTURE) ×4
SUT VIC AB 1 CTX36XBRD ANBCTR (SUTURE) ×6 IMPLANT
SUT VIC AB 2-0 CTX 36 (SUTURE) ×5 IMPLANT
SUT VIC AB 2-0 UR6 27 (SUTURE) IMPLANT
SUT VIC AB 3-0 MH 27 (SUTURE) IMPLANT
SUT VIC AB 3-0 X1 27 (SUTURE) ×5 IMPLANT
SUT VICRYL 2 TP 1 (SUTURE) ×5 IMPLANT
SWAB COLLECTION DEVICE MRSA (MISCELLANEOUS) ×5 IMPLANT
SYSTEM SAHARA CHEST DRAIN RE-I (WOUND CARE) ×5 IMPLANT
TAPE CLOTH 4X10 WHT NS (GAUZE/BANDAGES/DRESSINGS) ×5 IMPLANT
TAPE CLOTH SURG 6X10 WHT LF (GAUZE/BANDAGES/DRESSINGS) ×5 IMPLANT
TIP APPLICATOR SPRAY EXTEND 16 (VASCULAR PRODUCTS) IMPLANT
TOWEL OR 17X24 6PK STRL BLUE (TOWEL DISPOSABLE) ×10 IMPLANT
TOWEL OR 17X26 10 PK STRL BLUE (TOWEL DISPOSABLE) ×10 IMPLANT
TRAY FOLEY CATH 14FRSI W/METER (CATHETERS) ×5 IMPLANT
TUBE ANAEROBIC SPECIMEN COL (MISCELLANEOUS) ×10 IMPLANT
WATER STERILE IRR 1000ML POUR (IV SOLUTION) ×5 IMPLANT

## 2016-05-14 NOTE — Progress Notes (Signed)
STROKE TEAM PROGRESS NOTE   SUBJECTIVE (INTERVAL HISTORY) Patient had no seizure over night. Had TEE, CT repeat and right thracotomy this morning. Currently very sleepy drowsy post op. Still has left hemiplegia.   OBJECTIVE Temp:  [98.3 F (36.8 C)-99.5 F (37.5 C)] 98.9 F (37.2 C) (07/07 0445) Pulse Rate:  [89-97] 97 (07/07 0445) Cardiac Rhythm:  [-] Normal sinus rhythm (07/07 0445) Resp:  [16-27] 25 (07/07 0445) BP: (118-135)/(82-94) 135/88 mmHg (07/07 0445) SpO2:  [100 %] 100 % (07/07 0445) Weight:  [84.5 kg (186 lb 4.6 oz)] 84.5 kg (186 lb 4.6 oz) (07/07 0445)  CBC:   Recent Labs Lab 05/11/16 1108 05/14/16 0439  WBC 9.9 12.0*  HGB 8.6* 7.8*  HCT 28.0* 25.4*  MCV 89.2 89.1  PLT 248 343    Basic Metabolic Panel:   Recent Labs Lab 05/11/16 1108 05/14/16 0439  NA 134* 134*  K 4.4 5.7*  CL 102 102  CO2 23 24  GLUCOSE 105* 97  BUN 18 14  CREATININE 1.46* 1.28*  CALCIUM 9.4 9.7    Lipid Panel:     Component Value Date/Time   CHOL 180 05/11/2016 0518   TRIG 108 05/11/2016 0518   HDL 46 05/11/2016 0518   CHOLHDL 3.9 05/11/2016 0518   VLDL 22 05/11/2016 0518   LDLCALC 112* 05/11/2016 0518   HgbA1c:  Lab Results  Component Value Date   HGBA1C 5.4 05/11/2016     IMAGING I have personally reviewed the radiological images below and agree with the radiology interpretations.  Dg Chest 2 View 05/10/2016  Enlarging loculated right pleural fluid collection, possibly reaccumulation of an empyema. Probable airspace opacification in the right lung. Electronically Signed   By: Leanna BattlesMelinda  Blietz M.D.   On: 05/10/2016 16:00   Ct Head Wo Contrast 05/14/2016 1. Continued interval evolution of acute right MCA territory infarct with associated small volume petechial hemorrhage. Similar localized edema without significant mass effect or midline shift. 2. No new intracranial process.  05/13/2016 Evolution of the right frontal lobe infarct with a new area of hemorrhage within  this infarct. No hemorrhage seen elsewhere. No new infarct seen elsewhere. No midline shift. No subdural or epidural fluid collections.  05/10/2016  SUBTLE AREA OF LOW DENSITY AND LOSS OF GRAY-WHITE DIFFERENTIATION IN THE ANTERIOR RIGHT FRONTAL LOBE COMPATIBLE WITH ACUTE INFARCTION.  02/10/2016 No acute intracranial abnormality or acute abnormality of the cervical spine. Sinus disease is likely related to intubation and OG tube placement. The patient's OG tube is looped in the pharynx and mouth.  Mr Brain Wo Contrast 05/10/2016  Subacute to chronic appearing RIGHT frontoparietal lobe infarct in RIGHT MCA distribution with petechial hemorrhage. Findings less likely represent venous infarct. 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   Ct Angio head & Neck W Or Wo Contrast 05/11/2016   Progression of cytotoxic edema compared with the initial CT from 05/10/2016 in the RIGHT frontal lobe. Moderate irregularity of the A3 and A4 segments, anterior cerebral artery on the RIGHT, correlates with the observed pattern of restricted diffusion on MR; considerations would include vasculitis, intracranial atherosclerosis, or vasospasm. No intracranial large vessel occlusion or extracranial stenosis of significance. No findings to support cortical venous or dural venous thrombosis. Suspected recurrent empyema in the RIGHT chest, 9 x 10 cm cross-section with air-fluid level. Recommend CT chest with contrast for further evaluation.   2D Echocardiogram  02/25/2016 - Left ventricle: The cavity size was normal. Systolic  function was normal. The estimated ejection fraction was in the range of 60% to 65%. Wall motion was normal; there were no regional wall motion abnormalities. Left ventricular diastolic function parameters were normal. - Aortic valve: Trileaflet; mildly thickened leaflets. Valve area (VTI): 1.91 cm^2. Valve area (Vmax): 2.11 cm^2. Valve  area (Vmean): 2.14 cm^2. - Right atrium: Mobile linear density seen in the right atrium.  This most likely represents a catheter. There is a bright target off of this that is highly mobile and concerning for possible thrombus or vegetation. This is not seen in all views. Recommend TEE for further evaluation is clinically indicated. - Pulmonary arteries: PA peak pressure: 39 mm Hg (S). Impressions:   The right ventricular systolic pressure was increased consistent with mild pulmonary hypertension.  TCD bubble study - negative for PFO  LE venous doppler - negative for DVT   Ct Head Wo Contrast 05/13/2016  IMPRESSION: Evolution of the right frontal lobe infarct with a new area of hemorrhage within this infarct. No hemorrhage seen elsewhere. No new infarct seen elsewhere. No midline shift. No subdural or epidural fluid collections.   EEG This is an abnormal EEG due to focal right hemispheric slowing. This is consistent with known stroke in that region. No seizures or epileptiform discharges are seen on this study.   TEE 05/14/16 all valve look fine with no vegetation, no LV or LA or LAA thrombus, normal LV function. Ascending aorta looks normal   PHYSICAL EXAM General - Well nourished, well developed, in no apparent distress.  Ophthalmologic - Fundi not visualized due to noncooperation.  Cardiovascular - Regular rate and rhythm.  Mental Status -  Level of arousal and orientation to time, place, and person were intact. Language including expression, naming, repetition, comprehension was assessed and found intact, but bradyphasia. Fund of Knowledge was assessed and was intact.  Cranial Nerves II - XII - II - Visual field intact OU. III, IV, VI - Extraocular movements intact. V - Facial sensation intact bilaterally except subjective right V2 decreased sensation about 80% of the left. VII - Facial movement intact bilaterally. VIII - Hearing & vestibular intact bilaterally. X - Palate elevates  symmetrically. XI - Chin turning & shoulder shrug intact bilaterally XII - Tongue protrusion intact.  Motor Strength - The patient's strength was 5/5 RUE and RLE, but 0/5 LUE and LLE.  Bulk was normal and fasciculations were absent.   Motor Tone - Muscle tone was assessed at the neck and appendages and was normal.  Reflexes - The patient's reflexes were 1+ in all extremities and she had no pathological reflexes.  Sensory - Light touch, temperature/pinprick were assessed and were subjectively decreased on the right UE and LE, about 90% of the left    Coordination - The patient had normal movements in the right hand with no ataxia or dysmetria.  Tremor was absent.  Gait and Station - not tested due to weakness.   ASSESSMENT/PLAN Ms. Phylliss BobKatia D Akel is a 35 y.o. female admitted 3 mos ago after drinking drano and jumping from a 5th floor window who developed aphasia in the hospital with decreased movements L side. She did not receive IV t-PA due to unknown LKW, recent trauma.   Cryptogenic stroke with hemorrhagic transformation - R ACA and MCA/ACA acute infarct with hemorrhagic transformation, embolic secondary to unknown etiology.  Resultant  Left hemiplegia  MRI  R ACA and MCA/ACA infarct with petechial hemorrhage. Severe diffuse axonal injury.  CTA head & neck  Irregular R ACA A3-A4 segments, no carotid dissection  Repeat CT head showed right frontal hemorrhagic transformation  Repeat CT head stable petechial hemorrhage transformation  LE venous Doppler negative for DVT  TCD bubble study pending negative for PFO  2D Echo 02/25/16 - R atrium bright target, highly mobile, concerning for possible thrombus/vegetation  TEE - 05/14/16 - all valve look fine with no vegetation, no LV or LA or LAA thrombus, normal LV function. 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 CT repeat stable. Will likely restarted ASA once hemodynamically stable post op.   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   Other Active Problems  Trauma s/p suicidal attempt and fall  Suicidal attempt  Depression on zoloft  R empyema s/p right thracotomy  Hospital day # 95   Marvel Plan, MD PhD Stroke Neurology 05/14/2016 9:07 AM      To contact Stroke Continuity provider, please refer to [email or web address]. After hours, contact General Neurology

## 2016-05-14 NOTE — Progress Notes (Signed)
Physical Therapy Treatment and re-evaluation Patient Details Name: [name] Bell MRN: [id number] DOB: 06/23/1981 Today's Date: 05/14/2016    History of Present Illness [name] Bell is a 35 y.o. female admitted to [facility] on 02/09/16 s/p attempted suicide: taped DNR note to chest, drank Drano, then jumped off 5 [address] building [place]S/p 02/09/16 gel foam embolization of bil internal iliac arteries.S/p 02/09/16 ex lap with cholecystectomy, resection SB and mesenteric repair, liver lac repair, application or wound vac.S/p 02/09/16 closed reduction of pelvic ring, external pelvic fixation.Also has right metacarpal fracture in splint. Pt on and off intubation via trach/ and on/off trac collar.  Pelvic x- fixator pins removed at bedside on 03/23/16.  Underwent thoracotomy 6/19 due to empyema/abcess with 2 chest tubes, one d/c 04/30/16, second d/c 05/05/16 and pt no longer has trach.  Pt with no significant PMHx listed in the chart. Pt had acute speech and left sided weakness changes on7/3/17 and both CT and MRI confirmed stroke (R frontoparietal).  Thoracotomy and evac of hematoma today 05/14/16.      PT Comments    Pt admitted with above diagnosis. Pt currently with functional limitations due to the deficits listed below (see PT Problem List). Pt was able to sit EOB x 15 min with total to min guard assist.  Varying assist as pt had thoracotomy earlier and was sleepy as well as pt not wanting to participate. Goals of 7/4 still appropriate therefore continue toward those goals. Pt with strength grossly 3/5 in right LE and 0/5 in left LE.  Pt will benefit from skilled PT to increase their independence and safety with mobility to allow discharge to the venue listed below.    Follow Up Recommendations  CIR     Equipment Recommendations  Wheelchair (measurements PT);Wheelchair cushion (measurements PT);Hospital bed;Other (comment);3in1 (PT);Rolling walker with 5" wheels    Recommendations for Other  Services Rehab consult     Precautions / Restrictions Precautions Precautions: Fall Precaution Comments: left hemiparesis Required Braces or Orthoses: Other Brace/Splint Other Brace/Splint: L wrist spint Restrictions Weight Bearing Restrictions: Yes RUE Weight Bearing: Weight bearing as tolerated LUE Weight Bearing: Weight bearing as tolerated RLE Weight Bearing: Weight bearing as tolerated LLE Weight Bearing: Weight bearing as tolerated Other Position/Activity Restrictions: WBAT for gait training beginning 04/30/16    Mobility  Bed Mobility Overal bed mobility: +2 for physical assistance;Needs Assistance Bed Mobility: Supine to Sit Rolling: +2 for physical assistance;Max assist Sidelying to sit: Mod assist;HOB elevated   Sit to supine: +2 for physical assistance;Max assist;Total assist   General bed mobility comments: Assisted OOB assisting with LEs and elevation of trunk. Assisted pt back to bed required +2 assist with trunk and LEs to advance patient back to supine position. Scoot in supine with bed pad and +2 assistance.     Transfers                 General transfer comment: Dangled only due to thoracotomy today.   Ambulation/Gait                 Stairs            Wheelchair Mobility    Modified Rankin (Stroke Patients Only) Modified Rankin (Stroke Patients Only) Pre-Morbid Rankin Score: Moderately severe disability Modified Rankin: Severe disability     Balance Overall balance assessment: Needs assistance Sitting-balance support: Bilateral upper extremity supported;Feet supported Sitting balance-[name]: Poor Sitting balance - Comments: posterior lean but could come forward and sit with  min guard assist with chair in front of pt for her to hold on with her right hand.  Pt sat 10 minutes with varying assist min guard to max assist.  Cues given throughout sitting for pt to maintain sitting balance. Pt needed max encouragement and was  slightly upset about sitting up.  Postural control: Posterior lean                          Cognition Arousal/Alertness: Awake/alert Behavior During Therapy: WFL for tasks assessed/performed Overall Cognitive Status: Impaired/Different from baseline Area of Impairment: Following commands Orientation Level: Time;Disoriented to Current Attention Level: Sustained (with cues)   Following Commands: Follows one step commands with increased time Safety/Judgement: Decreased awareness of safety;Decreased awareness of deficits Awareness: Emergent Problem Solving: Slow processing;Decreased initiation General Comments: Pt lethargic, eyes closed much of the session, cues to re-focus to tasks.  Very distracted by pain multiple locations, easily irritated by questions and priximity of therapist to patient while guarding.     Exercises Total Joint Exercises Long Arc Quad: AROM;Right;5 reps;Seated    General Comments        Pertinent Vitals/Pain Pain Assessment: Faces Faces Pain Scale: Hurts even more Pain Location: right side Pain Descriptors / Indicators: Grimacing;Guarding;Aching Pain Intervention(s): Limited activity within patient's tolerance;Monitored during session;Premedicated before session;Repositioned  VSS    Home Living                      Prior Function            PT Goals (current goals can now be found in the care plan section) Progress towards PT goals: Progressing toward goals    Frequency  Min 5X/week    PT Plan Current plan remains appropriate    Co-evaluation             End of Session Equipment Utilized During Treatment: Gait belt;Oxygen Activity Tolerance: Patient limited by pain;Patient limited by fatigue Patient left: in bed;with call bell/phone within reach;with nursing/sitter in room     Time: [id number] PT Time Calculation (min) (ACUTE ONLY): 26 min  Charges:  $Therapeutic Exercise: 8-22 mins $Therapeutic Activity: 8-22  mins                    G CodesBerline Bell:      [name] Bell F 05/14/2016, 4:18 PM [employer] CorporationDawn Lawana Bell,PT Acute Rehabilitation [phone])

## 2016-05-14 NOTE — Clinical Social Work Psych Note (Signed)
Psych CSW continuing to follow for disposition assistance.  Patient medically unstable at this time though disposition is projected to continue to be: [facility].   [name] PennaGina Ayjah Show, LCSW [phone]  [facility] Licensed Clinical Social Worker

## 2016-05-14 NOTE — Anesthesia Procedure Notes (Addendum)
Central Venous Catheter Insertion Performed by: anesthesiologist Patient location: Pre-op. Preanesthetic checklist: patient identified, IV checked, site marked, risks and benefits discussed, surgical consent, monitors and equipment checked, pre-op evaluation, timeout performed and anesthesia consent Position: Trendelenburg Lidocaine 1% used for infiltration Landmarks identified and Seldinger technique used Catheter size: 8 Fr Central line was placed.Double lumen Procedure performed using ultrasound guided technique. Attempts: 1 Following insertion, dressing applied, line sutured and Biopatch. Post procedure assessment: blood return through all ports. Patient tolerated the procedure well with no immediate complications.   Procedure Name: Intubation Date/Time: 05/14/2016 7:53 AM Performed by: Dairl PonderJIANG, Nyelah Emmerich Pre-anesthesia Checklist: Patient identified, Emergency Drugs available, Suction available, Patient being monitored and Timeout performed Patient Re-evaluated:Patient Re-evaluated prior to inductionOxygen Delivery Method: Circle system utilized Preoxygenation: Pre-oxygenation with 100% oxygen Intubation Type: IV induction Laryngoscope Size: Mac and 3 Grade View: Grade I Tube type: Oral Endobronchial tube: Double lumen EBT and 37 Fr Number of attempts: 1 Airway Equipment and Method: Stylet Placement Confirmation: ETT inserted through vocal cords under direct vision,  positive ETCO2 and breath sounds checked- equal and bilateral Tube secured with: Tape Dental Injury: Teeth and Oropharynx as per pre-operative assessment

## 2016-05-14 NOTE — Progress Notes (Signed)
  Echocardiogram Echocardiogram Transesophageal has been performed.  Leta JunglingCooper, Jillianna Stanek M 05/14/2016, 8:43 AM

## 2016-05-14 NOTE — Anesthesia Preprocedure Evaluation (Signed)
Anesthesia Evaluation  Patient identified by MRN, date of birth, ID band Patient awake    Reviewed: Allergy & Precautions, NPO status , Patient's Chart, lab work & pertinent test results  Airway Mallampati: II  TM Distance: >3 FB Neck ROM: Full    Dental no notable dental hx.    Pulmonary neg pulmonary ROS,    breath sounds clear to auscultation + decreased breath sounds      Cardiovascular + Peripheral Vascular Disease  negative cardio ROS Normal cardiovascular exam Rhythm:Regular Rate:Normal     Neuro/Psych negative neurological ROS  negative psych ROS   GI/Hepatic negative GI ROS, Neg liver ROS,   Endo/Other  negative endocrine ROS  Renal/GU negative Renal ROS  negative genitourinary   Musculoskeletal negative musculoskeletal ROS (+)   Abdominal   Peds negative pediatric ROS (+)  Hematology  (+) anemia ,   Anesthesia Other Findings   Reproductive/Obstetrics negative OB ROS                             Anesthesia Physical Anesthesia Plan  ASA: III  Anesthesia Plan: General   Post-op Pain Management:    Induction: Intravenous  Airway Management Planned: Double Lumen EBT  Additional Equipment: Arterial line  Intra-op Plan:   Post-operative Plan: Possible Post-op intubation/ventilation  Informed Consent: I have reviewed the patients History and Physical, chart, labs and discussed the procedure including the risks, benefits and alternatives for the proposed anesthesia with the patient or authorized representative who has indicated his/her understanding and acceptance.   Dental advisory given  Plan Discussed with: CRNA and Surgeon  Anesthesia Plan Comments:         Anesthesia Quick Evaluation

## 2016-05-14 NOTE — Anesthesia Postprocedure Evaluation (Signed)
Anesthesia Post Note  Patient: [name] Bell  Procedure(s) Performed: Procedure(s) (LRB): RIGHT VIDEO ASSISTED THORACOSCOPY (VATS),DRAINAGE OF EMPYEMA (Right) TRANSESOPHAGEAL ECHOCARDIOGRAM (TEE) (N/A) EVACUATION HEMATOMA (Right)  Patient location during evaluation: PACU Anesthesia Type: General Level of consciousness: awake and alert Pain management: pain level controlled Vital Signs Assessment: post-procedure vital signs reviewed and stable Respiratory status: spontaneous breathing, nonlabored ventilation, respiratory function stable and patient connected to nasal cannula oxygen Cardiovascular status: blood pressure returned to baseline and stable Postop Assessment: no signs of nausea or vomiting Anesthetic complications: no    Last Vitals:  Filed Vitals:   05/14/16 1050 05/14/16 1055  BP:  123/82  Pulse:  98  Temp: 36.4 C   Resp:  26    Last Pain:  Filed Vitals:   05/14/16 1056  PainSc: Asleep                 Kaitlyn Skowron S
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
## 2016-05-14 NOTE — Transfer of Care (Signed)
Immediate Anesthesia Transfer of Care Note  Patient: [name] Bell  Procedure(s) Performed: Procedure(s) with comments: RIGHT VIDEO ASSISTED THORACOSCOPY (VATS),DRAINAGE OF EMPYEMA (Right) TRANSESOPHAGEAL ECHOCARDIOGRAM (TEE) (N/A) EVACUATION HEMATOMA (Right) - Evacuation of Hematoma Right Chest  Patient Location: PACU  Anesthesia Type:General  Level of Consciousness: awake and oriented  Airway & Oxygen Therapy: Patient Spontanous Breathing and Patient connected to nasal cannula oxygen  Post-op Assessment: Report given to RN and Post -op Vital signs reviewed and stable  Post vital signs: Reviewed and stable  Last Vitals:  Filed Vitals:   05/13/16 2348 05/14/16 0445  BP: 133/94 135/88  Pulse: 93 97  Temp: 37.3 C 37.2 C  Resp: 27 25    Last Pain:  Filed Vitals:   05/14/16 0447  PainSc: Asleep      Patients Stated Pain Goal: 2 (05/13/16 2343)  Complications: No apparent anesthesia complications

## 2016-05-14 NOTE — Progress Notes (Signed)
SLP Cancellation Note  Patient Details Name: [name] Bell MRN: [id number] DOB: [birth year]   Cancelled treatment:       Reason Eval/Treat Not Completed: Patient at procedure or test/unavailable   [name], [name] 05/14/2016, 8:04 AM
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
## 2016-05-14 NOTE — Progress Notes (Signed)
Trauma Service Note  Subjective: Patient sleepy after surgery.  Arousable, but falls right back to sleep.  Objective: Vital signs in last 24 hours: Temp:  [97.5 F (36.4 C)-99.2 F (37.3 C)] 98.2 F (36.8 C) (07/07 1150) Pulse Rate:  [90-101] 96 (07/07 1156) Resp:  [14-29] 15 (07/07 1156) BP: (119-135)/(82-94) 120/86 mmHg (07/07 1156) SpO2:  [95 %-100 %] 95 % (07/07 1156) Weight:  [84.5 kg (186 lb 4.6 oz)] 84.5 kg (186 lb 4.6 oz) (07/07 0445) Last BM Date: 05/11/16  Intake/Output from previous day: 07/06 0701 - 07/07 0700 In: 1512.5 [P.O.:480; I.V.:962.5; IV Piggyback:70] Out: -  Intake/Output this shift: Total I/O In: 1200 [I.V.:1200] Out: 705 [Urine:525; Blood:50; Chest Tube:130]  General: No acute distress.  Sleepy    Lungs: Can hear breath wounds on the right.  CXR shows mostly expanded lung. Output is serosanguinous  Abd: Benign  Extremities: No changes.  Neuro: Intact, continued left sided deficits from CVA  Lab Results: CBC   Recent Labs  05/14/16 0439  WBC 12.0*  HGB 7.8*  HCT 25.4*  PLT 343   BMET  Recent Labs  05/14/16 0439  NA 134*  K 5.7*  CL 102  CO2 24  GLUCOSE 97  BUN 14  CREATININE 1.28*  CALCIUM 9.7   PT/INR No results for input(s): LABPROT, INR in the last 72 hours. ABG No results for input(s): PHART, HCO3 in the last 72 hours.  Invalid input(s): PCO2, PO2  Studies/Results: Ct Head Wo Contrast  05/14/2016  CLINICAL DATA:  Follow-up intracranial hemorrhage/infarct. EXAM: CT HEAD WITHOUT CONTRAST TECHNIQUE: Contiguous axial images were obtained from the base of the skull through the vertex without intravenous contrast. COMPARISON:  Prior CT from 05/13/2016. FINDINGS: Continued interval evolution of acute right MCA territory infarct with cytotoxic edema present within the anterior right frontal lobe. Overall distribution an size is relatively similar to previous exam. Previously noted area of hemorrhage is felt to be most consistent  with probable associated petechial hemorrhage rather than frank hemorrhagic transformation. This measures approximately 12 mm (series 201, image 25). Similar localized mass effect without midline shift or significant edema. Gray-white matter differentiation otherwise maintained. No other acute large vessel territory infarct. No mass lesion or extra-axial fluid collection. No hydrocephalus. Basilar cisterns remain patent. Scalp soft tissues within normal limits. No acute abnormality about the globes and orbits. Paranasal sinuses remain largely clear.  No mastoid effusion. Calvarium intact. IMPRESSION: 1. Continued interval evolution of acute right MCA territory infarct with associated small volume petechial hemorrhage. Similar localized edema without significant mass effect or midline shift. 2. No new intracranial process. Electronically Signed   By: Rise Mu M.D.   On: 05/14/2016 06:59   Ct Head Wo Contrast  05/13/2016  CLINICAL DATA:  Seizure.  Recent right frontal lobe infarct. EXAM: CT HEAD WITHOUT CONTRAST TECHNIQUE: Contiguous axial images were obtained from the base of the skull through the vertex without intravenous contrast. COMPARISON:  May 11, 2016 head CT; brain MRI May 10, 2016 FINDINGS: Brain: The ventricles are normal in size and configuration. There has been further evolution of an infarct in the superior right frontal lobe with cytotoxic edema in this area. There is a small area of hemorrhage along the periphery of this infarct measuring 1.9 x 1.2 cm. No other hemorrhage is seen. There is no well-defined mass. There is no subdural or epidural fluid collection. No midline shift. No new gray-white compartment lesions outside of the right frontal lobe. Vascular: There is no  appreciable vascular calcification or hyperattenuation. Skull: Bony calvarium appears intact. Sinuses/Orbits: Visualized orbits appear symmetric bilaterally. Visualized paranasal sinuses are clear. Other: Mastoid air  cells are clear. IMPRESSION: Evolution of the right frontal lobe infarct with a new area of hemorrhage within this infarct. No hemorrhage seen elsewhere. No new infarct seen elsewhere. No midline shift. No subdural or epidural fluid collections. Critical Value/emergent results were called by telephone at the time of interpretation on 05/13/2016 at 12:11 pm to Dr. Marvel PlanJINDONG XU , who verbally acknowledged these results. Electronically Signed   By: Bretta BangWilliam  Woodruff III M.D.   On: 05/13/2016 12:11   Ct Chest Wo Contrast  05/13/2016  CLINICAL DATA:  35 year old female inpatient with recurrent right pleural effusion. Level 1 trauma sustained 02/09/2016. EXAM: CT CHEST WITHOUT CONTRAST TECHNIQUE: Multidetector CT imaging of the chest was performed following the standard protocol without IV contrast. COMPARISON:  05/10/2016 chest radiograph.  04/24/2016 chest CT. FINDINGS: Mediastinum/Nodes: Normal heart size. No significant pericardial fluid/thickening. Great vessels are normal in course and caliber. No discrete thyroid nodules. Unremarkable esophagus. Stable mild bilateral axillary/ retropectoral adenopathy measuring up to 1.1 cm on the right (series 201/ image 22) and 1.0 cm on the left (series 201/ image 25). No pathologically enlarged mediastinal or gross hilar nodes on this noncontrast study. Lungs/Pleura: No pneumothorax. The large gas containing right posterior pleural collection measuring 12.4 x 11.5 cm in maximum axial dimensions (series 201/ image 67), previously 10.0 x 9.7 cm on 04/24/2016, increased, with heterogeneous internal density and air-fluid level. Near complete compressive atelectasis of the right lower lobe, segmental compressive atelectasis of the posterior right upper lobe and subsegmental compressive atelectasis of the right middle lobe. No left pleural effusion. Hypoventilatory change in the left lung. No significant pulmonary nodules in the aerated portions of the lungs. Upper abdomen:  Cholecystectomy. Stable bandlike region of low-attenuation in the anterior central liver, consistent with healing laceration. Musculoskeletal: No aggressive appearing focal osseous lesions. Re- demonstrated are comminuted displaced fractures of the right second through twelfth ribs, several which demonstrate periosteal callus formation and heterotopic calcification particularly in the posterior lower right rib fractures. Re- demonstrated are healing nondisplaced left posterior first and second rib fractures. IMPRESSION: 1. Large gas containing right posterior pleural collection with heterogeneous density, increased in size from 04/24/2016, suggestive of an empyema. 2. Significant compressive atelectasis of the posterior right lung by the pleural collection. 3. Stable mild bilateral axillary adenopathy. 4. Healing bilateral rib fractures, or exuberant periosteal callus/ heterotopic calcification in several of the posterior right rib fractures. Electronically Signed   By: Delbert PhenixJason A Poff M.D.   On: 05/13/2016 12:42   Dg Chest Port 1 View  05/14/2016  CLINICAL DATA:  Status post thoracotomy.  Postop. EXAM: PORTABLE CHEST 1 VIEW COMPARISON:  Chest x-ray dated 05/10/2016 and chest CT dated 05/13/2016. FINDINGS: Significantly improved aeration of the right lung status post thoracotomy. Right-sided chest tube in place with tip directed towards the right lung apex. No pneumothorax seen. Residual pleural thickening versus pleural effusion along the lateral margin of the right lung. Left lung remains clear. Heart size is normal and now more appropriately positioned in the midline. Right IJ central line appears well positioned with tip at the level of the cavoatrial junction. Defects again noted within the lateral aspects of the upper right ribs. No new osseous abnormality seen. IMPRESSION: Significantly improved aeration of the right lung status post thoracotomy. Right-sided chest tube appears well positioned. Right IJ central  catheter appears well positioned. No  evidence of surgical complicating feature. Electronically Signed   By: Bary Richard M.D.   On: 05/14/2016 11:46    Anti-infectives: Anti-infectives    Start     Dose/Rate Route Frequency Ordered Stop   05/14/16 1300  cefUROXime (ZINACEF) 1.5 g in dextrose 5 % 50 mL IVPB     1.5 g 100 mL/hr over 30 Minutes Intravenous Every 12 hours 05/14/16 1255 05/15/16 1259   05/10/16 1230  sulfamethoxazole-trimethoprim (BACTRIM DS,SEPTRA DS) 800-160 MG per tablet 1 tablet     1 tablet Oral Every 12 hours 05/10/16 1122 05/21/16 0959   05/07/16 1300  ciprofloxacin (CIPRO) tablet 500 mg  Status:  Discontinued     500 mg Oral 2 times daily 05/07/16 1200 05/10/16 1122   05/06/16 1700  ceFEPIme (MAXIPIME) 2 g in dextrose 5 % 50 mL IVPB  Status:  Discontinued     2 g 100 mL/hr over 30 Minutes Intravenous Every 8 hours 05/06/16 1316 05/07/16 1200   04/27/16 1000  ceFEPIme (MAXIPIME) 2 g in dextrose 5 % 50 mL IVPB  Status:  Discontinued     2 g 100 mL/hr over 30 Minutes Intravenous Every 12 hours 04/27/16 0818 05/06/16 1316   04/22/16 1200  piperacillin-tazobactam (ZOSYN) IVPB 3.375 g  Status:  Discontinued     3.375 g 12.5 mL/hr over 240 Minutes Intravenous Every 6 hours 04/22/16 1039 04/22/16 1042   04/22/16 1200  vancomycin (VANCOCIN) 1,500 mg in sodium chloride 0.9 % 500 mL IVPB  Status:  Discontinued     1,500 mg 250 mL/hr over 120 Minutes Intravenous Every 12 hours 04/22/16 1133 04/28/16 0115   04/22/16 1130  piperacillin-tazobactam (ZOSYN) IVPB 3.375 g  Status:  Discontinued     3.375 g 12.5 mL/hr over 240 Minutes Intravenous Every 8 hours 04/22/16 1043 04/27/16 0754   04/02/16 1200  fluconazole (DIFLUCAN) 40 MG/ML suspension 400 mg     400 mg Per Tube  Once 04/02/16 1026 04/02/16 1442   03/31/16 1000  sulfamethoxazole-trimethoprim (BACTRIM,SEPTRA) 200-40 MG/5ML suspension 40 mL  Status:  Discontinued     40 mL Per Tube Every 12 hours 03/31/16 0927 03/31/16 0956    03/31/16 1000  sulfamethoxazole-trimethoprim (BACTRIM,SEPTRA) 200-40 MG/5ML suspension 20 mL     20 mL Per Tube Every 6 hours 03/31/16 0956 04/03/16 2359   03/26/16 1030  cefTRIAXone (ROCEPHIN) 2 g in dextrose 5 % 50 mL IVPB  Status:  Discontinued     2 g 100 mL/hr over 30 Minutes Intravenous Every 24 hours 03/26/16 1023 03/31/16 0921   03/25/16 1000  ceFEPIme (MAXIPIME) 2 g in dextrose 5 % 50 mL IVPB  Status:  Discontinued     2 g 100 mL/hr over 30 Minutes Intravenous Every 12 hours 03/25/16 0829 03/26/16 1023   03/08/16 1400  levofloxacin (LEVAQUIN) IVPB 750 mg     750 mg 100 mL/hr over 90 Minutes Intravenous Every 24 hours 03/08/16 0912 03/10/16 1501   03/06/16 1400  ceFEPIme (MAXIPIME) 2 g in dextrose 5 % 50 mL IVPB  Status:  Discontinued     2 g 100 mL/hr over 30 Minutes Intravenous Every 12 hours 03/06/16 1344 03/08/16 0912   03/01/16 1330  ceFAZolin (ANCEF) IVPB 2g/100 mL premix     2 g 200 mL/hr over 30 Minutes Intravenous To [facility] 02/29/16 0917 03/01/16 1452   02/11/16 0600  piperacillin-tazobactam (ZOSYN) IVPB 3.375 g  Status:  Discontinued     3.375 g 100 mL/hr over 30 Minutes Intravenous  4 times per day 02/10/16 2214 02/22/16 0853   02/10/16 1730  piperacillin-tazobactam (ZOSYN) IVPB 3.375 g  Status:  Discontinued     3.375 g 12.5 mL/hr over 240 Minutes Intravenous 3 times per day 02/10/16 1727 02/10/16 2214      Assessment/Plan: s/p Procedure(s): RIGHT VIDEO ASSISTED THORACOSCOPY (VATS),DRAINAGE OF EMPYEMA TRANSESOPHAGEAL ECHOCARDIOGRAM (TEE) EVACUATION HEMATOMA Advance diet Clear liquids.  LOS: 95 days   Marta LamasJames O. Gae BonWyatt, III, MD, FACS [phone] Trauma Surgeon 05/14/2016

## 2016-05-14 NOTE — Brief Op Note (Addendum)
02/09/2016 - 05/14/2016  10:04 AM  PATIENT:  [name] Bell  35 y.o. female  PRE-OPERATIVE DIAGNOSIS:  RIGHT hydropneumothorax  POST-OPERATIVE DIAGNOSIS:  Right hemothorax  PROCEDURE:  Procedure(s): Redo Right Thoracotomy, drainage of hemothorax TRANSESOPHAGEAL ECHOCARDIOGRAM (TEE) (N/A): Findings: all valve look fine with no vegetation, no LV or LA or LAA thrombus, normal LV function. Ascending aorta looks normal.  SURGEON:  Surgeon(s) and Role:    * Alleen BorneBryan K Bartle, MD - Primary  PHYSICIAN ASSISTANT: Erin Barrett, PA-C     ANESTHESIA:   general  EBL:  Total I/O In: 1000 [I.V.:1000] Out: 450 [Urine:400; Blood:50]  BLOOD ADMINISTERED:none  DRAINS: one 40 F  Chest Tube(s) in the right pleural space   LOCAL MEDICATIONS USED:  NONE  SPECIMEN:  Source of Specimen:  right chest wall hematoma and right pleural space hematoma for routine culture.  DISPOSITION OF SPECIMEN:  micro  COUNTS:  YES  TOURNIQUET:  * No tourniquets in log *  DICTATION: .Note written in EPIC  PLAN OF CARE: Admit to inpatient   PATIENT DISPOSITION:  PACU - hemodynamically stable.   Delay start of Pharmacological VTE agent (>24hrs) due to surgical blood loss or risk of bleeding: yes    I would not use any anticoagulants in this patient due to the high risk of recurrent bleeding into the very inflamed right pleural space.

## 2016-05-14 NOTE — Plan of Care (Signed)
Problem: Pain Managment: Goal: General experience of comfort will improve Outcome: Progressing Pt appears more comfortable this evening  Problem: Physical Regulation: Goal: Ability to maintain clinical measurements within normal limits will improve Outcome: Completed/Met Date Met:  05/14/16 Stable assessment

## 2016-05-15 ENCOUNTER — Inpatient Hospital Stay (HOSPITAL_COMMUNITY): Payer: Medicaid Other

## 2016-05-15 LAB — GLUCOSE, CAPILLARY
Glucose-Capillary: 110 mg/dL — ABNORMAL HIGH (ref 65–99)
Glucose-Capillary: 115 mg/dL — ABNORMAL HIGH (ref 65–99)
Glucose-Capillary: 116 mg/dL — ABNORMAL HIGH (ref 65–99)
Glucose-Capillary: 118 mg/dL — ABNORMAL HIGH (ref 65–99)
Glucose-Capillary: 118 mg/dL — ABNORMAL HIGH (ref 65–99)
Glucose-Capillary: 138 mg/dL — ABNORMAL HIGH (ref 65–99)

## 2016-05-15 LAB — BLOOD GAS, ARTERIAL
Acid-base deficit: 0.3 mmol/L (ref 0.0–2.0)
Bicarbonate: 24.3 mEq/L — ABNORMAL HIGH (ref 20.0–24.0)
Drawn by: 36490
FIO2: 0.21
O2 SAT: 97.3 %
PATIENT TEMPERATURE: 98.3
PO2 ART: 93.6 mmHg (ref 80.0–100.0)
TCO2: 25.6 mmol/L (ref 0–100)
pCO2 arterial: 42.5 mmHg (ref 35.0–45.0)
pH, Arterial: 7.375 (ref 7.350–7.450)

## 2016-05-15 LAB — CBC
HEMATOCRIT: 20.9 % — AB (ref 36.0–46.0)
HEMOGLOBIN: 6.4 g/dL — AB (ref 12.0–15.0)
MCH: 26.7 pg (ref 26.0–34.0)
MCHC: 30.1 g/dL (ref 30.0–36.0)
MCV: 88.6 fL (ref 78.0–100.0)
Platelets: 333 10*3/uL (ref 150–400)
RBC: 2.36 MIL/uL — AB (ref 3.87–5.11)
RDW: 15.5 % (ref 11.5–15.5)
WBC: 9.4 10*3/uL (ref 4.0–10.5)

## 2016-05-15 LAB — BASIC METABOLIC PANEL
ANION GAP: 6 (ref 5–15)
BUN: 15 mg/dL (ref 6–20)
CALCIUM: 8.9 mg/dL (ref 8.9–10.3)
CO2: 25 mmol/L (ref 22–32)
Chloride: 103 mmol/L (ref 101–111)
Creatinine, Ser: 1.27 mg/dL — ABNORMAL HIGH (ref 0.44–1.00)
GFR, EST NON AFRICAN AMERICAN: 55 mL/min — AB (ref >60)
Glucose, Bld: 117 mg/dL — ABNORMAL HIGH (ref 65–99)
Potassium: 4.6 mmol/L (ref 3.5–5.1)
Sodium: 134 mmol/L — ABNORMAL LOW (ref 135–145)

## 2016-05-15 LAB — PREPARE RBC (CROSSMATCH)

## 2016-05-15 MED ORDER — PHENOL 1.4 % MT LIQD
1.0000 | OROMUCOSAL | Status: DC | PRN
  Administered 2016-05-15: 1 via OROMUCOSAL
  Filled 2016-05-15: qty 177

## 2016-05-15 MED ORDER — DEXTROSE 5 % IV SOLN
1.5000 g | Freq: Two times a day (BID) | INTRAVENOUS | Status: AC
  Administered 2016-05-15 – 2016-05-17 (×4): 1.5 g via INTRAVENOUS
  Filled 2016-05-15 (×4): qty 1.5

## 2016-05-15 MED ORDER — DIPHENHYDRAMINE HCL 25 MG PO CAPS
25.0000 mg | ORAL_CAPSULE | Freq: Three times a day (TID) | ORAL | Status: DC | PRN
  Administered 2016-05-15: 25 mg via ORAL
  Filled 2016-05-15: qty 1

## 2016-05-15 MED ORDER — SODIUM CHLORIDE 0.9 % IV SOLN
Freq: Once | INTRAVENOUS | Status: AC
  Administered 2016-05-15: 06:00:00 via INTRAVENOUS

## 2016-05-15 MED ORDER — LEVALBUTEROL HCL 0.63 MG/3ML IN NEBU: 0.6300 mg | INHALATION_SOLUTION | Freq: Four times a day (QID) | RESPIRATORY_TRACT | Status: DC | PRN

## 2016-05-15 MED ORDER — LEVALBUTEROL HCL 0.63 MG/3ML IN NEBU
0.6300 mg | INHALATION_SOLUTION | Freq: Three times a day (TID) | RESPIRATORY_TRACT | Status: DC
  Administered 2016-05-15: 0.63 mg via RESPIRATORY_TRACT
  Filled 2016-05-15 (×2): qty 3

## 2016-05-15 NOTE — Progress Notes (Signed)
1 Day Post-Op Procedure(s) (LRB): RIGHT VIDEO ASSISTED THORACOSCOPY (VATS),DRAINAGE OF EMPYEMA (Right) TRANSESOPHAGEAL ECHOCARDIOGRAM (TEE) (N/A) EVACUATION HEMATOMA (Right) Subjective: CXR this am looks good Chest tube output 400- leave tube for several days Receiving PRBCs for Hb 6.2 -- NO LOVENOX, NO ASA  CHEST WOUND BEING PACKED WWET/DRY DAILYjective: Vital signs in last 24 hours: Temp:  [97.5 F (36.4 C)-98.4 F (36.9 C)] 97.7 F (36.5 C) (07/08 16100635) Pulse Rate:  [81-101] 81 (07/08 0700) Cardiac Rhythm:  [-] Normal sinus rhythm (07/07 2000) Resp:  [0-29] 18 (07/08 0700) BP: (92-129)/(55-87) 95/59 mmHg (07/08 0700) SpO2:  [95 %-100 %] 100 % (07/08 0733) Arterial Line BP: (106-148)/(49-85) 125/56 mmHg (07/08 0700) Weight:  [184 lb 15.5 oz (83.9 kg)] 184 lb 15.5 oz (83.9 kg) (07/08 0400)  Hemodynamic parameters for last 24 hours:  NSR  Intake/Output from previous day: 07/07 0701 - 07/08 0700 In: 3445.8 [P.O.:490; I.V.:2820.8; IV Piggyback:135] Out: 1870 [Urine:1420; Blood:50; Chest Tube:400] Intake/Output this shift:    COMFORTABLE IN BED CLEAR SPECH STILL WITH MOTOR DEFICIT FROM RECENT stroke  Lab Results:  Recent Labs  05/14/16 0439 05/15/16 0400  WBC 12.0* 9.4  HGB 7.8* 6.4*  HCT 25.4* 20.9*  PLT 343 333   BMET:  Recent Labs  05/14/16 0439 05/15/16 0400  NA 134* 134*  K 5.7* 4.6  CL 102 103  CO2 24 25  GLUCOSE 97 117*  BUN 14 15  CREATININE 1.28* 1.27*  CALCIUM 9.7 8.9    PT/INR: No results for input(s): LABPROT, INR in the last 72 hours. ABG    Component Value Date/Time   PHART 7.375 05/15/2016 0345   HCO3 24.3* 05/15/2016 0345   TCO2 25.6 05/15/2016 0345   ACIDBASEDEF 0.3 05/15/2016 0345   O2SAT 97.3 05/15/2016 0345   CBG (last 3)   Recent Labs  05/15/16 0010 05/15/16 0414 05/15/16 0828  GLUCAP 110* 115* 118*    Assessment/Plan: S/P Procedure(s) (LRB): RIGHT VIDEO ASSISTED THORACOSCOPY (VATS),DRAINAGE OF EMPYEMA  (Right) TRANSESOPHAGEAL ECHOCARDIOGRAM (TEE) (N/A) EVACUATION HEMATOMA (Right) transfuse  leave chest tube to suction No blood thinners   LOS: 96 days    Kathlee Nationseter Van Trigt III 05/15/2016

## 2016-05-15 NOTE — Progress Notes (Signed)
1 Day Post-Op  Subjective: Comfortable Denies SOB  Objective: Vital signs in last 24 hours: Temp:  [97.5 F (36.4 C)-98.4 F (36.9 C)] 97.7 F (36.5 C) (07/08 0635) Pulse Rate:  [81-101] 81 (07/08 0700) Resp:  [0-29] 18 (07/08 0700) BP: (92-129)/(55-87) 95/59 mmHg (07/08 0700) SpO2:  [95 %-100 %] 100 % (07/08 0733) Arterial Line BP: (106-148)/(49-85) 125/56 mmHg (07/08 0700) Weight:  [83.9 kg (184 lb 15.5 oz)] 83.9 kg (184 lb 15.5 oz) (07/08 0400) Last BM Date: 05/11/16  Intake/Output from previous day: 07/07 0701 - 07/08 0700 In: 3445.8 [P.O.:490; I.V.:2820.8; IV Piggyback:135] Out: 1870 [Urine:1420; Blood:50; Chest Tube:400] Intake/Output this shift:     Lungs mostly clear CT in place CV RRR Abdomen soft, NT  Lab Results:   Recent Labs  05/14/16 0439 05/15/16 0400  WBC 12.0* 9.4  HGB 7.8* 6.4*  HCT 25.4* 20.9*  PLT 343 333   BMET  Recent Labs  05/14/16 0439 05/15/16 0400  NA 134* 134*  K 5.7* 4.6  CL 102 103  CO2 24 25  GLUCOSE 97 117*  BUN 14 15  CREATININE 1.28* 1.27*  CALCIUM 9.7 8.9   PT/INR No results for input(s): LABPROT, INR in the last 72 hours. ABG  Recent Labs  05/15/16 0345  PHART 7.375  HCO3 24.3*    Studies/Results: Ct Head Wo Contrast  05/14/2016  CLINICAL DATA:  Follow-up intracranial hemorrhage/infarct. EXAM: CT HEAD WITHOUT CONTRAST TECHNIQUE: Contiguous axial images were obtained from the base of the skull through the vertex without intravenous contrast. COMPARISON:  Prior CT from 05/13/2016. FINDINGS: Continued interval evolution of acute right MCA territory infarct with cytotoxic edema present within the anterior right frontal lobe. Overall distribution an size is relatively similar to previous exam. Previously noted area of hemorrhage is felt to be most consistent with probable associated petechial hemorrhage rather than frank hemorrhagic transformation. This measures approximately 12 mm (series 201, image 25). Similar  localized mass effect without midline shift or significant edema. Gray-white matter differentiation otherwise maintained. No other acute large vessel territory infarct. No mass lesion or extra-axial fluid collection. No hydrocephalus. Basilar cisterns remain patent. Scalp soft tissues within normal limits. No acute abnormality about the globes and orbits. Paranasal sinuses remain largely clear.  No mastoid effusion. Calvarium intact. IMPRESSION: 1. Continued interval evolution of acute right MCA territory infarct with associated small volume petechial hemorrhage. Similar localized edema without significant mass effect or midline shift. 2. No new intracranial process. Electronically Signed   By: Rise Mu M.D.   On: 05/14/2016 06:59   Ct Head Wo Contrast  05/13/2016  CLINICAL DATA:  Seizure.  Recent right frontal lobe infarct. EXAM: CT HEAD WITHOUT CONTRAST TECHNIQUE: Contiguous axial images were obtained from the base of the skull through the vertex without intravenous contrast. COMPARISON:  May 11, 2016 head CT; brain MRI May 10, 2016 FINDINGS: Brain: The ventricles are normal in size and configuration. There has been further evolution of an infarct in the superior right frontal lobe with cytotoxic edema in this area. There is a small area of hemorrhage along the periphery of this infarct measuring 1.9 x 1.2 cm. No other hemorrhage is seen. There is no well-defined mass. There is no subdural or epidural fluid collection. No midline shift. No new gray-white compartment lesions outside of the right frontal lobe. Vascular: There is no appreciable vascular calcification or hyperattenuation. Skull: Bony calvarium appears intact. Sinuses/Orbits: Visualized orbits appear symmetric bilaterally. Visualized paranasal sinuses are clear. Other: Mastoid air  cells are clear. IMPRESSION: Evolution of the right frontal lobe infarct with a new area of hemorrhage within this infarct. No hemorrhage seen elsewhere. No new  infarct seen elsewhere. No midline shift. No subdural or epidural fluid collections. Critical Value/emergent results were called by telephone at the time of interpretation on 05/13/2016 at 12:11 pm to Dr. Marvel Plan , who verbally acknowledged these results. Electronically Signed   By: Bretta Bang III M.D.   On: 05/13/2016 12:11   Ct Chest Wo Contrast  05/13/2016  CLINICAL DATA:  35 year old female inpatient with recurrent right pleural effusion. Level 1 trauma sustained 02/09/2016. EXAM: CT CHEST WITHOUT CONTRAST TECHNIQUE: Multidetector CT imaging of the chest was performed following the standard protocol without IV contrast. COMPARISON:  05/10/2016 chest radiograph.  04/24/2016 chest CT. FINDINGS: Mediastinum/Nodes: Normal heart size. No significant pericardial fluid/thickening. Great vessels are normal in course and caliber. No discrete thyroid nodules. Unremarkable esophagus. Stable mild bilateral axillary/ retropectoral adenopathy measuring up to 1.1 cm on the right (series 201/ image 22) and 1.0 cm on the left (series 201/ image 25). No pathologically enlarged mediastinal or gross hilar nodes on this noncontrast study. Lungs/Pleura: No pneumothorax. The large gas containing right posterior pleural collection measuring 12.4 x 11.5 cm in maximum axial dimensions (series 201/ image 67), previously 10.0 x 9.7 cm on 04/24/2016, increased, with heterogeneous internal density and air-fluid level. Near complete compressive atelectasis of the right lower lobe, segmental compressive atelectasis of the posterior right upper lobe and subsegmental compressive atelectasis of the right middle lobe. No left pleural effusion. Hypoventilatory change in the left lung. No significant pulmonary nodules in the aerated portions of the lungs. Upper abdomen: Cholecystectomy. Stable bandlike region of low-attenuation in the anterior central liver, consistent with healing laceration. Musculoskeletal: No aggressive appearing focal  osseous lesions. Re- demonstrated are comminuted displaced fractures of the right second through twelfth ribs, several which demonstrate periosteal callus formation and heterotopic calcification particularly in the posterior lower right rib fractures. Re- demonstrated are healing nondisplaced left posterior first and second rib fractures. IMPRESSION: 1. Large gas containing right posterior pleural collection with heterogeneous density, increased in size from 04/24/2016, suggestive of an empyema. 2. Significant compressive atelectasis of the posterior right lung by the pleural collection. 3. Stable mild bilateral axillary adenopathy. 4. Healing bilateral rib fractures, or exuberant periosteal callus/ heterotopic calcification in several of the posterior right rib fractures. Electronically Signed   By: Delbert Phenix M.D.   On: 05/13/2016 12:42   Dg Chest Port 1 View  05/15/2016  CLINICAL DATA:  35 year old female status post right thoracotomy for recurrent right hydro pneumothorax. Proceeding history of multi trauma in April including posttraumatic right rib fractures, right pleural collection, chest tube. Initial encounter. EXAM: PORTABLE CHEST 1 VIEW COMPARISON:  05/14/2016 and earlier. FINDINGS: Portable AP semi upright view at 0541 hours. Right chest tube remains in place and appears stable. No pneumothorax identified. Stable reduced right lung volume. Stable peripheral increased right lung opacity which might reflect pleural thickening or scarring in association with numerous right lateral rib fractures. Stable right IJ central line. Stable mediastinal contours. The left lung is stable and clear. No pulmonary edema. No new osseous abnormality identified. IMPRESSION: 1. Stable right chest tube with no pneumothorax and stable right lung ventilation, significantly improved since 05/10/2016. 2. No new cardiopulmonary abnormality. 3. Multiple right rib fractures again noted, probably with associated right lateral  pleural thickening. Electronically Signed   By: Odessa Fleming M.D.   On: 05/15/2016  07:29   Dg Chest Port 1 View  05/14/2016  CLINICAL DATA:  Status post thoracotomy.  Postop. EXAM: PORTABLE CHEST 1 VIEW COMPARISON:  Chest x-ray dated 05/10/2016 and chest CT dated 05/13/2016. FINDINGS: Significantly improved aeration of the right lung status post thoracotomy. Right-sided chest tube in place with tip directed towards the right lung apex. No pneumothorax seen. Residual pleural thickening versus pleural effusion along the lateral margin of the right lung. Left lung remains clear. Heart size is normal and now more appropriately positioned in the midline. Right IJ central line appears well positioned with tip at the level of the cavoatrial junction. Defects again noted within the lateral aspects of the upper right ribs. No new osseous abnormality seen. IMPRESSION: Significantly improved aeration of the right lung status post thoracotomy. Right-sided chest tube appears well positioned. Right IJ central catheter appears well positioned. No evidence of surgical complicating feature. Electronically Signed   By: Bary RichardStan  Maynard M.D.   On: 05/14/2016 11:46    Anti-infectives: Anti-infectives    Start     Dose/Rate Route Frequency Ordered Stop   05/14/16 1400  cefUROXime (ZINACEF) 1.5 g in dextrose 5 % 50 mL IVPB     1.5 g 100 mL/hr over 30 Minutes Intravenous Every 12 hours 05/14/16 1255 05/15/16 0300   05/10/16 1230  sulfamethoxazole-trimethoprim (BACTRIM DS,SEPTRA DS) 800-160 MG per tablet 1 tablet     1 tablet Oral Every 12 hours 05/10/16 1122 05/21/16 0959   05/07/16 1300  ciprofloxacin (CIPRO) tablet 500 mg  Status:  Discontinued     500 mg Oral 2 times daily 05/07/16 1200 05/10/16 1122   05/06/16 1700  ceFEPIme (MAXIPIME) 2 g in dextrose 5 % 50 mL IVPB  Status:  Discontinued     2 g 100 mL/hr over 30 Minutes Intravenous Every 8 hours 05/06/16 1316 05/07/16 1200   04/27/16 1000  ceFEPIme (MAXIPIME) 2 g in  dextrose 5 % 50 mL IVPB  Status:  Discontinued     2 g 100 mL/hr over 30 Minutes Intravenous Every 12 hours 04/27/16 0818 05/06/16 1316   04/22/16 1200  piperacillin-tazobactam (ZOSYN) IVPB 3.375 g  Status:  Discontinued     3.375 g 12.5 mL/hr over 240 Minutes Intravenous Every 6 hours 04/22/16 1039 04/22/16 1042   04/22/16 1200  vancomycin (VANCOCIN) 1,500 mg in sodium chloride 0.9 % 500 mL IVPB  Status:  Discontinued     1,500 mg 250 mL/hr over 120 Minutes Intravenous Every 12 hours 04/22/16 1133 04/28/16 0115   04/22/16 1130  piperacillin-tazobactam (ZOSYN) IVPB 3.375 g  Status:  Discontinued     3.375 g 12.5 mL/hr over 240 Minutes Intravenous Every 8 hours 04/22/16 1043 04/27/16 0754   04/02/16 1200  fluconazole (DIFLUCAN) 40 MG/ML suspension 400 mg     400 mg Per Tube  Once 04/02/16 1026 04/02/16 1442   03/31/16 1000  sulfamethoxazole-trimethoprim (BACTRIM,SEPTRA) 200-40 MG/5ML suspension 40 mL  Status:  Discontinued     40 mL Per Tube Every 12 hours 03/31/16 0927 03/31/16 0956   03/31/16 1000  sulfamethoxazole-trimethoprim (BACTRIM,SEPTRA) 200-40 MG/5ML suspension 20 mL     20 mL Per Tube Every 6 hours 03/31/16 0956 04/03/16 2359   03/26/16 1030  cefTRIAXone (ROCEPHIN) 2 g in dextrose 5 % 50 mL IVPB  Status:  Discontinued     2 g 100 mL/hr over 30 Minutes Intravenous Every 24 hours 03/26/16 1023 03/31/16 0921   03/25/16 1000  ceFEPIme (MAXIPIME) 2 g in dextrose 5 %  50 mL IVPB  Status:  Discontinued     2 g 100 mL/hr over 30 Minutes Intravenous Every 12 hours 03/25/16 0829 03/26/16 1023   03/08/16 1400  levofloxacin (LEVAQUIN) IVPB 750 mg     750 mg 100 mL/hr over 90 Minutes Intravenous Every 24 hours 03/08/16 0912 03/10/16 1501   03/06/16 1400  ceFEPIme (MAXIPIME) 2 g in dextrose 5 % 50 mL IVPB  Status:  Discontinued     2 g 100 mL/hr over 30 Minutes Intravenous Every 12 hours 03/06/16 1344 03/08/16 0912   03/01/16 1330  ceFAZolin (ANCEF) IVPB 2g/100 mL premix     2 g 200 mL/hr  over 30 Minutes Intravenous To [facility] 02/29/16 0917 03/01/16 1452   02/11/16 0600  piperacillin-tazobactam (ZOSYN) IVPB 3.375 g  Status:  Discontinued     3.375 g 100 mL/hr over 30 Minutes Intravenous 4 times per day 02/10/16 2214 02/22/16 0853   02/10/16 1730  piperacillin-tazobactam (ZOSYN) IVPB 3.375 g  Status:  Discontinued     3.375 g 12.5 mL/hr over 240 Minutes Intravenous 3 times per day 02/10/16 1727 02/10/16 2214      Assessment/Plan: s/p Procedure(s) with comments: RIGHT VIDEO ASSISTED THORACOSCOPY (VATS),DRAINAGE OF EMPYEMA (Right) TRANSESOPHAGEAL ECHOCARDIOGRAM (TEE) (N/A) EVACUATION HEMATOMA (Right) - Evacuation of Hematoma Right Chest  Post op anemia.  Transfusing PRBC's Pulmonary toilet Continue current care  LOS: 96 days    Cortnee Steinmiller A 05/15/2016

## 2016-05-15 NOTE — Progress Notes (Signed)
STROKE TEAM PROGRESS NOTE   SUBJECTIVE (INTERVAL HISTORY) Patient s/p right thracotomy yesterday. No seizure overnight. Still has left hemiplegia but able to move thumb now. Had Hb drop to 6.4 and on blood transfusion.    OBJECTIVE Temp:  [97.5 F (36.4 C)-98.4 F (36.9 C)] 97.7 F (36.5 C) (07/08 0635) Pulse Rate:  [81-101] 81 (07/08 0700) Cardiac Rhythm:  [-] Normal sinus rhythm (07/07 2000) Resp:  [0-29] 18 (07/08 0700) BP: (92-129)/(55-87) 95/59 mmHg (07/08 0700) SpO2:  [95 %-100 %] 100 % (07/08 0733) Arterial Line BP: (106-148)/(49-85) 125/56 mmHg (07/08 0700) Weight:  [83.9 kg (184 lb 15.5 oz)] 83.9 kg (184 lb 15.5 oz) (07/08 0400)  CBC:   Recent Labs Lab 05/14/16 0439 05/15/16 0400  WBC 12.0* 9.4  HGB 7.8* 6.4*  HCT 25.4* 20.9*  MCV 89.1 88.6  PLT 343 333    Basic Metabolic Panel:   Recent Labs Lab 05/14/16 0439 05/15/16 0400  NA 134* 134*  K 5.7* 4.6  CL 102 103  CO2 24 25  GLUCOSE 97 117*  BUN 14 15  CREATININE 1.28* 1.27*  CALCIUM 9.7 8.9    Lipid Panel:     Component Value Date/Time   CHOL 180 05/11/2016 0518   TRIG 108 05/11/2016 0518   HDL 46 05/11/2016 0518   CHOLHDL 3.9 05/11/2016 0518   VLDL 22 05/11/2016 0518   LDLCALC 112* 05/11/2016 0518   HgbA1c:  Lab Results  Component Value Date   HGBA1C 5.4 05/11/2016     IMAGING I have personally reviewed the radiological images below and agree with the radiology interpretations.  Dg Chest 2 View 05/10/2016  Enlarging loculated right pleural fluid collection, possibly reaccumulation of an empyema. Probable airspace opacification in the right lung. Electronically Signed   By: Leanna Battles M.D.   On: 05/10/2016 16:00   Ct Head Wo Contrast 05/14/2016 1. Continued interval evolution of acute right MCA territory infarct with associated small volume petechial hemorrhage. Similar localized edema without significant mass effect or midline shift. 2. No new intracranial process.  05/13/2016  Evolution of the right frontal lobe infarct with a new area of hemorrhage within this infarct. No hemorrhage seen elsewhere. No new infarct seen elsewhere. No midline shift. No subdural or epidural fluid collections.  05/10/2016  SUBTLE AREA OF LOW DENSITY AND LOSS OF GRAY-WHITE DIFFERENTIATION IN THE ANTERIOR RIGHT FRONTAL LOBE COMPATIBLE WITH ACUTE INFARCTION.  02/10/2016 No acute intracranial abnormality or acute abnormality of the cervical spine. Sinus disease is likely related to intubation and OG tube placement. The patient's OG tube is looped in the pharynx and mouth.  Mr Brain Wo Contrast 05/10/2016  Subacute to chronic appearing RIGHT frontoparietal lobe infarct in RIGHT MCA distribution with petechial hemorrhage. Findings less likely represent venous infarct. 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   Ct Angio head & Neck W Or Wo Contrast 05/11/2016   Progression of cytotoxic edema compared with the initial CT from 05/10/2016 in the RIGHT frontal lobe. Moderate irregularity of the A3 and A4 segments, anterior cerebral artery on the RIGHT, correlates with the observed pattern of restricted diffusion on MR; considerations would include vasculitis, intracranial atherosclerosis, or vasospasm. No intracranial large vessel occlusion or extracranial stenosis of significance. No findings to support cortical venous or dural venous thrombosis. Suspected recurrent empyema in the RIGHT chest, 9 x 10 cm cross-section with air-fluid level. Recommend CT chest with contrast for further evaluation.   2D  Echocardiogram  02/25/2016 - Left ventricle: The cavity size was normal. Systolic function was normal. The estimated ejection fraction was in the range of 60% to 65%. Wall motion was normal; there were no regional wall motion abnormalities. Left ventricular diastolic function parameters were normal. - Aortic valve: Trileaflet; mildly  thickened leaflets. Valve area (VTI): 1.91 cm^2. Valve area (Vmax): 2.11 cm^2. Valve area (Vmean): 2.14 cm^2. - Right atrium: Mobile linear density seen in the right atrium.  This most likely represents a catheter. There is a bright target off of this that is highly mobile and concerning for possible thrombus or vegetation. This is not seen in all views. Recommend TEE for further evaluation is clinically indicated. - Pulmonary arteries: PA peak pressure: 39 mm Hg (S). Impressions:   The right ventricular systolic pressure was increased consistent with mild pulmonary hypertension.  TCD bubble study - negative for PFO  LE venous doppler - negative for DVT   Ct Head Wo Contrast 05/13/2016  IMPRESSION: Evolution of the right frontal lobe infarct with a new area of hemorrhage within this infarct. No hemorrhage seen elsewhere. No new infarct seen elsewhere. No midline shift. No subdural or epidural fluid collections.   EEG This is an abnormal EEG due to focal right hemispheric slowing. This is consistent with known stroke in that region. No seizures or epileptiform discharges are seen on this study.   TEE 05/14/16 all valve look fine with no vegetation, no LV or LA or LAA thrombus, normal LV function. Ascending aorta looks normal   PHYSICAL EXAM General - Well nourished, well developed, in no apparent distress.  Ophthalmologic - Fundi not visualized due to noncooperation.  Cardiovascular - Regular rate and rhythm.  Mental Status -  Level of arousal and orientation to time, place, and person were intact. Language including expression, naming, repetition, comprehension was assessed and found intact, but bradyphasia. Fund of Knowledge was assessed and was intact.  Cranial Nerves II - XII - II - Visual field intact OU. III, IV, VI - Extraocular movements intact. V - Facial sensation intact bilaterally. VII - Facial movement intact bilaterally. VIII - Hearing & vestibular intact bilaterally. X -  Palate elevates symmetrically. XI - Chin turning & shoulder shrug intact bilaterally XII - Tongue protrusion intact.  Motor Strength - The patient's strength was 5/5 RUE and RLE, but 0/5 LUE and LLE. Able to move left thumb today  Bulk was normal and fasciculations were absent.   Motor Tone - Muscle tone was assessed at the neck and appendages and was normal.  Reflexes - The patient's reflexes were 1+ in all extremities and she had no pathological reflexes.  Sensory - Light touch, temperature/pinprick were assessed and were decreased on LLE    Coordination - The patient had normal movements in the right hand with no ataxia or dysmetria.  Tremor was absent.  Gait and Station - not tested due to weakness.   ASSESSMENT/PLAN [name] is a 35 y.o. female admitted 3 mos ago after drinking drano and jumping from a 5th floor window who developed aphasia in the hospital with decreased movements L side. She did not receive IV t-PA due to unknown LKW, recent trauma.   Cryptogenic stroke with hemorrhagic transformation - R ACA and MCA/ACA acute infarct with hemorrhagic transformation, embolic secondary to unknown etiology.  Resultant  Left hemiplegia  MRI  R ACA and MCA/ACA infarct with petechial hemorrhage. Severe diffuse axonal injury.  CTA head & neck  Irregular R ACA  A3-A4 segments, no carotid dissection  Repeat CT head showed right frontal hemorrhagic transformation  Repeat CT head stable petechial hemorrhage transformation  LE venous Doppler negative for DVT  TCD bubble study pending negative for PFO  2D Echo 02/25/16 - R atrium bright target, highly mobile, concerning for possible thrombus/vegetation  TEE - 05/14/16 - all valve look fine with no vegetation, no LV or LA or LAA thrombus, normal LV function. Ascending aorta looks normal  LDL 112   HgbA1c 5.4  Hypercoagulable labls positive lupus anticoagulant and mildly positive HPP and cardiolipin IgM - need to repeat in 3  months or so    SCDs for VTE prophylaxis Diet clear liquid Room service appropriate?: Yes; Fluid consistency:: Thin  No antithrombotic prior to admission, now on no antithrombotics due to hemorrhagic transformation and dropping Hb needing blood transfusion. Will consider restarted ASA once hemodynamically stable.   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  New onset seizure night of 05/12/16, most likely related to stroke with hemorrhagic transformation  Treated with ativan acutely and vimpat load  Continue Vimpat 100mg  bid  EEG right slowing, no seizure  Seizure precautions  Anemia  Hb drop 7.8->6.4  On blood transfusion  Hold off ASA for now  Consider to restart once anemia improves and hemodynamically stable  BP management  Stable   Due to hemorrhagic transformation, BP goal < 160  On hydralazine IV PRN  Close monitoring  Avoid hypotension   Hyperlipidemia  Home meds:  No statin  LDL 112, goal < 70  Now on lipitor 20  Continue statin at discharge  Other Stroke Risk Factors  Overweight, Body mass index is 25.81 kg/(m^2).   Other Active Problems  Trauma s/p suicidal attempt and fall  Suicidal attempt  Depression on zoloft  R empyema s/p right thracotomy  Hospital day # 10  Marvel Plan, MD PhD Stroke Neurology 05/15/2016 9:45 AM    To contact Stroke Continuity provider, please refer to [email or web address]. After hours, contact General Neurology

## 2016-05-16 ENCOUNTER — Inpatient Hospital Stay (HOSPITAL_COMMUNITY): Payer: Medicaid Other

## 2016-05-16 LAB — COMPREHENSIVE METABOLIC PANEL
ALBUMIN: 2 g/dL — AB (ref 3.5–5.0)
ALT: 13 U/L — ABNORMAL LOW (ref 14–54)
AST: 21 U/L (ref 15–41)
Alkaline Phosphatase: 56 U/L (ref 38–126)
Anion gap: 5 (ref 5–15)
BUN: 11 mg/dL (ref 6–20)
CHLORIDE: 107 mmol/L (ref 101–111)
CO2: 25 mmol/L (ref 22–32)
Calcium: 9.2 mg/dL (ref 8.9–10.3)
Creatinine, Ser: 1.27 mg/dL — ABNORMAL HIGH (ref 0.44–1.00)
GFR calc Af Amer: >60 mL/min (ref >60)
GFR, EST NON AFRICAN AMERICAN: 55 mL/min — AB (ref >60)
Glucose, Bld: 82 mg/dL (ref 65–99)
POTASSIUM: 4.2 mmol/L (ref 3.5–5.1)
SODIUM: 137 mmol/L (ref 135–145)
Total Bilirubin: 0.5 mg/dL (ref 0.3–1.2)
Total Protein: 5.5 g/dL — ABNORMAL LOW (ref 6.5–8.1)

## 2016-05-16 LAB — TYPE AND SCREEN
ABO/RH(D): O POS
Antibody Screen: POSITIVE
DAT, IgG: POSITIVE
Donor AG Type: NEGATIVE
Donor AG Type: NEGATIVE
UNIT DIVISION: 0
Unit division: 0

## 2016-05-16 LAB — GLUCOSE, CAPILLARY
GLUCOSE-CAPILLARY: 107 mg/dL — AB (ref 65–99)
GLUCOSE-CAPILLARY: 129 mg/dL — AB (ref 65–99)
GLUCOSE-CAPILLARY: 133 mg/dL — AB (ref 65–99)
GLUCOSE-CAPILLARY: 82 mg/dL (ref 65–99)
Glucose-Capillary: 126 mg/dL — ABNORMAL HIGH (ref 65–99)
Glucose-Capillary: 112 mg/dL — ABNORMAL HIGH (ref 65–99)

## 2016-05-16 LAB — CBC
HCT: 24.8 % — ABNORMAL LOW (ref 36.0–46.0)
Hemoglobin: 7.7 g/dL — ABNORMAL LOW (ref 12.0–15.0)
MCH: 26.9 pg (ref 26.0–34.0)
MCHC: 31 g/dL (ref 30.0–36.0)
MCV: 86.7 fL (ref 78.0–100.0)
PLATELETS: 313 10*3/uL (ref 150–400)
RBC: 2.86 MIL/uL — ABNORMAL LOW (ref 3.87–5.11)
RDW: 16.3 % — AB (ref 11.5–15.5)
WBC: 7.3 10*3/uL (ref 4.0–10.5)

## 2016-05-16 LAB — MRSA PCR SCREENING: MRSA by PCR: NEGATIVE

## 2016-05-16 MED ORDER — INSULIN ASPART 100 UNIT/ML ~~LOC~~ SOLN
0.0000 [IU] | Freq: Three times a day (TID) | SUBCUTANEOUS | Status: DC
  Administered 2016-05-17: 2 [IU] via SUBCUTANEOUS

## 2016-05-16 NOTE — Progress Notes (Signed)
2 Days Post-Op Procedure(s) (LRB): RIGHT VIDEO ASSISTED THORACOSCOPY (VATS),DRAINAGE OF EMPYEMA (Right) TRANSESOPHAGEAL ECHOCARDIOGRAM (TEE) (N/A) EVACUATION HEMATOMA (Right) Subjective: Breathing comfortably Right chest tube drainage down 100 cc but would not remove chest tube anytime soon Operative cultures remained no growth Chest x-ray today stable  Objective: Vital signs in last 24 hours: Temp:  [98.2 F (36.8 C)-98.7 F (37.1 C)] 98.3 F (36.8 C) (07/09 1513) Pulse Rate:  [80-96] 82 (07/09 1700) Cardiac Rhythm:  [-] Normal sinus rhythm (07/09 1600) Resp:  [0-28] 28 (07/09 1700) BP: (106-145)/(63-92) 145/91 mmHg (07/09 1700) SpO2:  [97 %-100 %] 100 % (07/09 1700) Weight:  [187 lb 9.8 oz (85.1 kg)] 187 lb 9.8 oz (85.1 kg) (07/09 0500)  Hemodynamic parameters for last 24 hours:  stable  Intake/Output from previous day: 07/08 0701 - 07/09 0700 In: 4525 [P.O.:700; I.V.:2315; Blood:670; IV Piggyback:170] Out: 2780 [Urine:2680; Chest Tube:100] Intake/Output this shift: Total I/O In: 760 [P.O.:480; I.V.:180; IV Piggyback:100] Out: 1380 [Urine:1300; Chest Tube:80]    Lab Results:  Recent Labs  05/15/16 0400 05/16/16 0340  WBC 9.4 7.3  HGB 6.4* 7.7*  HCT 20.9* 24.8*  PLT 333 313   BMET:  Recent Labs  05/15/16 0400 05/16/16 0340  NA 134* 137  K 4.6 4.2  CL 103 107  CO2 25 25  GLUCOSE 117* 82  BUN 15 11  CREATININE 1.27* 1.27*  CALCIUM 8.9 9.2    PT/INR: No results for input(s): LABPROT, INR in the last 72 hours. ABG    Component Value Date/Time   PHART 7.375 05/15/2016 0345   HCO3 24.3* 05/15/2016 0345   TCO2 25.6 05/15/2016 0345   ACIDBASEDEF 0.3 05/15/2016 0345   O2SAT 97.3 05/15/2016 0345   CBG (last 3)   Recent Labs  05/16/16 0900 05/16/16 1252 05/16/16 1511  GLUCAP 126* 112* 133*    Assessment/Plan: S/P Procedure(s) (LRB): RIGHT VIDEO ASSISTED THORACOSCOPY (VATS),DRAINAGE OF EMPYEMA (Right) TRANSESOPHAGEAL ECHOCARDIOGRAM (TEE)  (N/A) EVACUATION HEMATOMA (Right) Leave chest tube in place continue Continue dressing changes of chest wall wound   LOS: 97 days    [name] Bell 05/16/2016

## 2016-05-16 NOTE — Progress Notes (Signed)
2 Days Post-Op  Subjective: C/o some nausea this am. O/w no c/o. Not working with therapies per nurse. Got 2u prbc for anemia yesterday  Objective: Vital signs in last 24 hours: Temp:  [97.2 F (36.2 C)-98.7 F (37.1 C)] 98.7 F (37.1 C) (07/09 0400) Pulse Rate:  [81-92] 90 (07/09 0700) Resp:  [0-24] 15 (07/09 0700) BP: (88-133)/(62-81) 133/81 mmHg (07/09 0700) SpO2:  [98 %-100 %] 100 % (07/09 0700) Arterial Line BP: (124-143)/(59-67) 140/65 mmHg (07/08 1419) Weight:  [85.1 kg (187 lb 9.8 oz)] 85.1 kg (187 lb 9.8 oz) (07/09 0500) Last BM Date: 05/11/16  Intake/Output from previous day: 07/08 0701 - 07/09 0700 In: 4525 [P.O.:700; I.V.:2315; Blood:670; IV Piggyback:170] Out: 2780 [Urine:2680; Chest Tube:100] Intake/Output this shift:    Asleep, easily arousable.  cta b/l; no air leak Reg Soft, nt No edema No mvmt LUE/LLE  Lab Results:   Recent Labs  05/15/16 0400 05/16/16 0340  WBC 9.4 7.3  HGB 6.4* 7.7*  HCT 20.9* 24.8*  PLT 333 313   BMET  Recent Labs  05/15/16 0400 05/16/16 0340  NA 134* 137  K 4.6 4.2  CL 103 107  CO2 25 25  GLUCOSE 117* 82  BUN 15 11  CREATININE 1.27* 1.27*  CALCIUM 8.9 9.2   PT/INR No results for input(s): LABPROT, INR in the last 72 hours. ABG  Recent Labs  05/15/16 0345  PHART 7.375  HCO3 24.3*    Studies/Results: Dg Chest Port 1 View  05/16/2016  CLINICAL DATA:  Chest tube in place. Status post right pleural space evacuation 2 days prior. EXAM: PORTABLE CHEST 1 VIEW COMPARISON:  Chest radiograph from one day prior. FINDINGS: Right internal jugular central venous catheter terminates in the lower third of the superior vena cava. Right apical chest tube is stable in position. Skin staples are noted in the lateral lower right chest wall. Stable cardiomediastinal silhouette with normal heart size. No pneumothorax. Pleural thickening throughout the peripheral right pleural space appears slightly increased. No left pleural  effusion. Numerous right lateral rib fractures are again noted. Hazy opacities throughout the right lung appear stable. No pulmonary edema. IMPRESSION: 1. Increased pleural thickening throughout the peripheral right pleural space, cannot exclude recurrent hemothorax or loculated pleural effusion. 2. No pneumothorax. 3. Stable hazy right lung opacities, favor atelectasis. 4. Multiple right rib fractures. Electronically Signed   By: Delbert PhenixJason A Poff M.D.   On: 05/16/2016 07:38   Dg Chest Port 1 View  05/15/2016  CLINICAL DATA:  35 year old female status post right thoracotomy for recurrent right hydro pneumothorax. Proceeding history of multi trauma in April including posttraumatic right rib fractures, right pleural collection, chest tube. Initial encounter. EXAM: PORTABLE CHEST 1 VIEW COMPARISON:  05/14/2016 and earlier. FINDINGS: Portable AP semi upright view at 0541 hours. Right chest tube remains in place and appears stable. No pneumothorax identified. Stable reduced right lung volume. Stable peripheral increased right lung opacity which might reflect pleural thickening or scarring in association with numerous right lateral rib fractures. Stable right IJ central line. Stable mediastinal contours. The left lung is stable and clear. No pulmonary edema. No new osseous abnormality identified. IMPRESSION: 1. Stable right chest tube with no pneumothorax and stable right lung ventilation, significantly improved since 05/10/2016. 2. No new cardiopulmonary abnormality. 3. Multiple right rib fractures again noted, probably with associated right lateral pleural thickening. Electronically Signed   By: Odessa FlemingH  Hall M.D.   On: 05/15/2016 07:29   Dg Chest Port 1 View  05/14/2016  CLINICAL DATA:  Status post thoracotomy.  Postop. EXAM: PORTABLE CHEST 1 VIEW COMPARISON:  Chest x-ray dated 05/10/2016 and chest CT dated 05/13/2016. FINDINGS: Significantly improved aeration of the right lung status post thoracotomy. Right-sided chest tube  in place with tip directed towards the right lung apex. No pneumothorax seen. Residual pleural thickening versus pleural effusion along the lateral margin of the right lung. Left lung remains clear. Heart size is normal and now more appropriately positioned in the midline. Right IJ central line appears well positioned with tip at the level of the cavoatrial junction. Defects again noted within the lateral aspects of the upper right ribs. No new osseous abnormality seen. IMPRESSION: Significantly improved aeration of the right lung status post thoracotomy. Right-sided chest tube appears well positioned. Right IJ central catheter appears well positioned. No evidence of surgical complicating feature. Electronically Signed   By: Bary Richard M.D.   On: 05/14/2016 11:46    Anti-infectives: Anti-infectives    Start     Dose/Rate Route Frequency Ordered Stop   05/15/16 1430  cefUROXime (ZINACEF) 1.5 g in dextrose 5 % 50 mL IVPB     1.5 g 100 mL/hr over 30 Minutes Intravenous Every 12 hours 05/15/16 1042 05/17/16 1429   05/14/16 1400  cefUROXime (ZINACEF) 1.5 g in dextrose 5 % 50 mL IVPB     1.5 g 100 mL/hr over 30 Minutes Intravenous Every 12 hours 05/14/16 1255 05/15/16 0300   05/10/16 1230  sulfamethoxazole-trimethoprim (BACTRIM DS,SEPTRA DS) 800-160 MG per tablet 1 tablet     1 tablet Oral Every 12 hours 05/10/16 1122 05/21/16 0959   05/07/16 1300  ciprofloxacin (CIPRO) tablet 500 mg  Status:  Discontinued     500 mg Oral 2 times daily 05/07/16 1200 05/10/16 1122   05/06/16 1700  ceFEPIme (MAXIPIME) 2 g in dextrose 5 % 50 mL IVPB  Status:  Discontinued     2 g 100 mL/hr over 30 Minutes Intravenous Every 8 hours 05/06/16 1316 05/07/16 1200   04/27/16 1000  ceFEPIme (MAXIPIME) 2 g in dextrose 5 % 50 mL IVPB  Status:  Discontinued     2 g 100 mL/hr over 30 Minutes Intravenous Every 12 hours 04/27/16 0818 05/06/16 1316   04/22/16 1200  piperacillin-tazobactam (ZOSYN) IVPB 3.375 g  Status:   Discontinued     3.375 g 12.5 mL/hr over 240 Minutes Intravenous Every 6 hours 04/22/16 1039 04/22/16 1042   04/22/16 1200  vancomycin (VANCOCIN) 1,500 mg in sodium chloride 0.9 % 500 mL IVPB  Status:  Discontinued     1,500 mg 250 mL/hr over 120 Minutes Intravenous Every 12 hours 04/22/16 1133 04/28/16 0115   04/22/16 1130  piperacillin-tazobactam (ZOSYN) IVPB 3.375 g  Status:  Discontinued     3.375 g 12.5 mL/hr over 240 Minutes Intravenous Every 8 hours 04/22/16 1043 04/27/16 0754   04/02/16 1200  fluconazole (DIFLUCAN) 40 MG/ML suspension 400 mg     400 mg Per Tube  Once 04/02/16 1026 04/02/16 1442   03/31/16 1000  sulfamethoxazole-trimethoprim (BACTRIM,SEPTRA) 200-40 MG/5ML suspension 40 mL  Status:  Discontinued     40 mL Per Tube Every 12 hours 03/31/16 0927 03/31/16 0956   03/31/16 1000  sulfamethoxazole-trimethoprim (BACTRIM,SEPTRA) 200-40 MG/5ML suspension 20 mL     20 mL Per Tube Every 6 hours 03/31/16 0956 04/03/16 2359   03/26/16 1030  cefTRIAXone (ROCEPHIN) 2 g in dextrose 5 % 50 mL IVPB  Status:  Discontinued     2 g 100  mL/hr over 30 Minutes Intravenous Every 24 hours 03/26/16 1023 03/31/16 0921   03/25/16 1000  ceFEPIme (MAXIPIME) 2 g in dextrose 5 % 50 mL IVPB  Status:  Discontinued     2 g 100 mL/hr over 30 Minutes Intravenous Every 12 hours 03/25/16 0829 03/26/16 1023   03/08/16 1400  levofloxacin (LEVAQUIN) IVPB 750 mg     750 mg 100 mL/hr over 90 Minutes Intravenous Every 24 hours 03/08/16 0912 03/10/16 1501   03/06/16 1400  ceFEPIme (MAXIPIME) 2 g in dextrose 5 % 50 mL IVPB  Status:  Discontinued     2 g 100 mL/hr over 30 Minutes Intravenous Every 12 hours 03/06/16 1344 03/08/16 0912   03/01/16 1330  ceFAZolin (ANCEF) IVPB 2g/100 mL premix     2 g 200 mL/hr over 30 Minutes Intravenous To [facility] 02/29/16 0917 03/01/16 1452   02/11/16 0600  piperacillin-tazobactam (ZOSYN) IVPB 3.375 g  Status:  Discontinued     3.375 g 100 mL/hr over 30 Minutes  Intravenous 4 times per day 02/10/16 2214 02/22/16 0853   02/10/16 1730  piperacillin-tazobactam (ZOSYN) IVPB 3.375 g  Status:  Discontinued     3.375 g 12.5 mL/hr over 240 Minutes Intravenous 3 times per day 02/10/16 1727 02/10/16 2214      Assessment/Plan: s/p Procedure(s) with comments: RIGHT VIDEO ASSISTED THORACOSCOPY (VATS),DRAINAGE OF EMPYEMA (Right) TRANSESOPHAGEAL ECHOCARDIOGRAM (TEE) (N/A) EVACUATION HEMATOMA (Right) - Evacuation of Hematoma Right Chest Jump from hotel/multiple toxic ingestions- Continue on Zoloft, sitter for safety.  S/P ex lap, hepatorraphy, SBR, cholecystectomy, preperitoneal pelvic packing [name] 4/3  Ex lap for acidosis/bleeding 4/4 Thompson Ex lap for bleeding 4/5 [name] S/P ex fix pelvis [name] 4/3 S/P ex lap 4/7 Thompson S/P ex lap 4/10 Thompson S/P sacral screws 4/10 [name] S/P application ABRA, open gastrostomy tube 4/12 [name]  S/P abd closure, trach 4/24 [name] Mult B rib FX/R HPTX, B pulm contusions  Multiple toxic ingestions Mult L wrist FXs - ok to remove splint per Dr. Melvyn Novas  Pelvic FX - S/P ex fix and angioembolization, S/P sacral screw by Dr. [name]. WBAT BLE. ABL anemia - s/p 2u prbc 7/8, mostly appropriate response. Repeat cbc in am Right empyema s/p thoracotomy with recurrence s/p right thoracotomy, drainage--CT per CT surgery. Hold blood thinner per CT surgery. 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 Appreciate neurology F/U  FEN - decrease mIVF VTE - SCD's, holding blood thinners per CT surgery Renal - d/c foley Dispo - SDU for now. Eventual CIR followed by [facility] or [facility]  Mary Sella. Andrey Campanile, MD, FACS General, Bariatric, & Minimally Invasive Surgery [facility], [place]   LOS: 97 days    Atilano Ina 05/16/2016

## 2016-05-16 NOTE — Progress Notes (Signed)
STROKE TEAM PROGRESS NOTE   SUBJECTIVE (INTERVAL HISTORY) Patient s/p blood transfusion yesterday. Today Hb 7.7. No seizure overnight. Still has left hemiplegia but able to move fingers now.    OBJECTIVE Temp:  [98.2 F (36.8 C)-98.7 F (37.1 C)] 98.3 F (36.8 C) (07/09 1513) Pulse Rate:  [80-96] 82 (07/09 1700) Cardiac Rhythm:  [-] Normal sinus rhythm (07/09 1600) Resp:  [10-28] 28 (07/09 1700) BP: (106-145)/(63-92) 145/91 mmHg (07/09 1700) SpO2:  [97 %-100 %] 100 % (07/09 1700) Weight:  [187 lb 9.8 oz (85.1 kg)] 187 lb 9.8 oz (85.1 kg) (07/09 0500)  CBC:   Recent Labs Lab 05/15/16 0400 05/16/16 0340  WBC 9.4 7.3  HGB 6.4* 7.7*  HCT 20.9* 24.8*  MCV 88.6 86.7  PLT 333 313    Basic Metabolic Panel:   Recent Labs Lab 05/15/16 0400 05/16/16 0340  NA 134* 137  K 4.6 4.2  CL 103 107  CO2 25 25  GLUCOSE 117* 82  BUN 15 11  CREATININE 1.27* 1.27*  CALCIUM 8.9 9.2    Lipid Panel:     Component Value Date/Time   CHOL 180 05/11/2016 0518   TRIG 108 05/11/2016 0518   HDL 46 05/11/2016 0518   CHOLHDL 3.9 05/11/2016 0518   VLDL 22 05/11/2016 0518   LDLCALC 112* 05/11/2016 0518   HgbA1c:  Lab Results  Component Value Date   HGBA1C 5.4 05/11/2016     IMAGING I have personally reviewed the radiological images below and agree with the radiology interpretations.  Dg Chest 2 View 05/10/2016  Enlarging loculated right pleural fluid collection, possibly reaccumulation of an empyema. Probable airspace opacification in the right lung. Electronically Signed   By: Leanna BattlesMelinda  Blietz M.D.   On: 05/10/2016 16:00   Ct Head Wo Contrast 05/14/2016 1. Continued interval evolution of acute right MCA territory infarct with associated small volume petechial hemorrhage. Similar localized edema without significant mass effect or midline shift. 2. No new intracranial process.  05/13/2016 Evolution of the right frontal lobe infarct with a new area of hemorrhage within this infarct. No  hemorrhage seen elsewhere. No new infarct seen elsewhere. No midline shift. No subdural or epidural fluid collections.  05/10/2016  SUBTLE AREA OF LOW DENSITY AND LOSS OF GRAY-WHITE DIFFERENTIATION IN THE ANTERIOR RIGHT FRONTAL LOBE COMPATIBLE WITH ACUTE INFARCTION.  02/10/2016 No acute intracranial abnormality or acute abnormality of the cervical spine. Sinus disease is likely related to intubation and OG tube placement. The patient's OG tube is looped in the pharynx and mouth.  Mr Brain Wo Contrast 05/10/2016  Subacute to chronic appearing RIGHT frontoparietal lobe infarct in RIGHT MCA distribution with petechial hemorrhage. Findings less likely represent venous infarct. 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   Ct Angio head & Neck W Or Wo Contrast 05/11/2016   Progression of cytotoxic edema compared with the initial CT from 05/10/2016 in the RIGHT frontal lobe. Moderate irregularity of the A3 and A4 segments, anterior cerebral artery on the RIGHT, correlates with the observed pattern of restricted diffusion on MR; considerations would include vasculitis, intracranial atherosclerosis, or vasospasm. No intracranial large vessel occlusion or extracranial stenosis of significance. No findings to support cortical venous or dural venous thrombosis. Suspected recurrent empyema in the RIGHT chest, 9 x 10 cm cross-section with air-fluid level. Recommend CT chest with contrast for further evaluation.   2D Echocardiogram  02/25/2016 - Left ventricle: The cavity size was normal. Systolic function was  normal. The estimated ejection fraction was in the range of 60% to 65%. Wall motion was normal; there were no regional wall motion abnormalities. Left ventricular diastolic function parameters were normal. - Aortic valve: Trileaflet; mildly thickened leaflets. Valve area (VTI): 1.91 cm^2. Valve area (Vmax): 2.11 cm^2. Valve area (Vmean): 2.14  cm^2. - Right atrium: Mobile linear density seen in the right atrium.  This most likely represents a catheter. There is a bright target off of this that is highly mobile and concerning for possible thrombus or vegetation. This is not seen in all views. Recommend TEE for further evaluation is clinically indicated. - Pulmonary arteries: PA peak pressure: 39 mm Hg (S). Impressions:   The right ventricular systolic pressure was increased consistent with mild pulmonary hypertension.  TCD bubble study - negative for PFO  LE venous doppler - negative for DVT   Ct Head Wo Contrast 05/13/2016  IMPRESSION: Evolution of the right frontal lobe infarct with a new area of hemorrhage within this infarct. No hemorrhage seen elsewhere. No new infarct seen elsewhere. No midline shift. No subdural or epidural fluid collections.   EEG This is an abnormal EEG due to focal right hemispheric slowing. This is consistent with known stroke in that region. No seizures or epileptiform discharges are seen on this study.   TEE 05/14/16 all valve look fine with no vegetation, no LV or LA or LAA thrombus, normal LV function. Ascending aorta looks normal   PHYSICAL EXAM General - Well nourished, well developed, in no apparent distress.  Ophthalmologic - Fundi not visualized due to noncooperation.  Cardiovascular - Regular rate and rhythm.  Mental Status -  Level of arousal and orientation to time, place, and person were intact. Language including expression, naming, repetition, comprehension was assessed and found intact, bradyphasia much improved. Fund of Knowledge was assessed and was intact.  Cranial Nerves II - XII - II - Visual field intact OU. III, IV, VI - Extraocular movements intact. V - Facial sensation intact bilaterally. VII - Facial movement intact bilaterally. VIII - Hearing & vestibular intact bilaterally. X - Palate elevates symmetrically. XI - Chin turning & shoulder shrug intact bilaterally XII -  Tongue protrusion intact.  Motor Strength - The patient's strength was 5/5 RUE and RLE, but 0/5 LUE and LLE. Able to move left fingers today  Bulk was normal and fasciculations were absent.   Motor Tone - Muscle tone was assessed at the neck and appendages and was normal.  Reflexes - The patient's reflexes were 1+ in all extremities and she had left positive babinski.  Sensory - Light touch, temperature/pinprick were assessed and were decreased on LLE    Coordination - The patient had normal movements in the right hand with no ataxia or dysmetria.  Tremor was absent.  Gait and Station - not tested due to weakness.   ASSESSMENT/PLAN [name] is a 35 y.o. female admitted 3 mos ago after drinking drano and jumping from a 5th floor window who developed aphasia in the hospital with decreased movements L side. She did not receive IV t-PA due to unknown LKW, recent trauma.   Cryptogenic stroke with hemorrhagic transformation - R ACA and MCA/ACA acute infarct with hemorrhagic transformation, embolic secondary to unknown etiology.  Resultant  Left hemiplegia  MRI  R ACA and MCA/ACA infarct with petechial hemorrhage. Severe diffuse axonal injury.  CTA head & neck  Irregular R ACA A3-A4 segments, no carotid dissection  Repeat CT head showed right frontal hemorrhagic  transformation  Repeat CT head stable petechial hemorrhage transformation  LE venous Doppler negative for DVT  TCD bubble study pending negative for PFO  2D Echo 02/25/16 - R atrium bright target, highly mobile, concerning for possible thrombus/vegetation  TEE - 05/14/16 - all valve look fine with no vegetation, no LV or LA or LAA thrombus, normal LV function. Ascending aorta looks normal  LDL 112   HgbA1c 5.4  Hypercoagulable labls positive lupus anticoagulant and mildly positive HPP and cardiolipin IgM - need to repeat in 3 months or so    SCDs for VTE prophylaxis DIET DYS 3 Room service appropriate?: Yes; Fluid  consistency:: Thin  No antithrombotic prior to admission, now on no antithrombotics due to hemorrhagic transformation, bloody pleural effusion and dropping Hb requiring blood transfusion. Will consider ASA once hemodynamically stable and cleared by CTS.   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  New onset seizure night of 05/12/16, most likely related to stroke with hemorrhagic transformation  Treated with ativan acutely and vimpat load  Continue Vimpat  bid  EEG right slowing, no seizure  Seizure precautions  Anemia  Hb drop 7.8->6.4->transfusion->7.7  s/p blood transfusion  Hold off ASA for now  Consider to restart ASA once anemia improves and hemodynamically stable and cleared by CTS  BP management  Stable   Due to hemorrhagic transformation, BP goal < 160  Avoid hypotension   Hyperlipidemia  Home meds:  No statin  LDL 112, goal < 70  Now on lipitor 20  Continue statin at discharge  Other Stroke Risk Factors  Overweight, Body mass index is 26.18 kg/(m^2).   Other Active Problems  Trauma s/p suicidal attempt and fall  Suicidal attempt  Depression on zoloft  R empyema s/p right thracotomy  Hospital day # 17  Marvel Plan, MD PhD Stroke Neurology 05/16/2016 5:57 PM    To contact Stroke Continuity provider, please refer to [email or web address]. After hours, contact General Neurology

## 2016-05-17 ENCOUNTER — Inpatient Hospital Stay (HOSPITAL_COMMUNITY): Payer: Medicaid Other

## 2016-05-17 ENCOUNTER — Encounter (HOSPITAL_COMMUNITY): Payer: Self-pay | Admitting: Surgery

## 2016-05-17 DIAGNOSIS — I63131 Cerebral infarction due to embolism of right carotid artery: Secondary | ICD-10-CM

## 2016-05-17 LAB — CBC
HEMATOCRIT: 24.4 % — AB (ref 36.0–46.0)
HEMOGLOBIN: 7.8 g/dL — AB (ref 12.0–15.0)
MCH: 27.4 pg (ref 26.0–34.0)
MCHC: 32 g/dL (ref 30.0–36.0)
MCV: 85.6 fL (ref 78.0–100.0)
Platelets: 357 10*3/uL (ref 150–400)
RBC: 2.85 MIL/uL — ABNORMAL LOW (ref 3.87–5.11)
RDW: 15.6 % — ABNORMAL HIGH (ref 11.5–15.5)
WBC: 6.7 10*3/uL (ref 4.0–10.5)

## 2016-05-17 LAB — BASIC METABOLIC PANEL
ANION GAP: 7 (ref 5–15)
BUN: 10 mg/dL (ref 6–20)
CALCIUM: 9.7 mg/dL (ref 8.9–10.3)
CO2: 24 mmol/L (ref 22–32)
Chloride: 104 mmol/L (ref 101–111)
Creatinine, Ser: 1.15 mg/dL — ABNORMAL HIGH (ref 0.44–1.00)
GFR calc Af Amer: >60 mL/min (ref >60)
GFR calc non Af Amer: >60 mL/min (ref >60)
GLUCOSE: 92 mg/dL (ref 65–99)
Potassium: 4.2 mmol/L (ref 3.5–5.1)
Sodium: 135 mmol/L (ref 135–145)

## 2016-05-17 LAB — GLUCOSE, CAPILLARY
Glucose-Capillary: 105 mg/dL — ABNORMAL HIGH (ref 65–99)
Glucose-Capillary: 144 mg/dL — ABNORMAL HIGH (ref 65–99)
Glucose-Capillary: 80 mg/dL (ref 65–99)
Glucose-Capillary: 120 mg/dL — ABNORMAL HIGH (ref 65–99)

## 2016-05-17 MED ORDER — FERROUS GLUCONATE 324 (38 FE) MG PO TABS
324.0000 mg | ORAL_TABLET | Freq: Two times a day (BID) | ORAL | Status: DC
  Administered 2016-05-17 – 2016-06-01 (×19): 324 mg via ORAL
  Filled 2016-05-17 (×26): qty 1

## 2016-05-17 MED ORDER — ADULT MULTIVITAMIN W/MINERALS CH
1.0000 | ORAL_TABLET | Freq: Every day | ORAL | Status: DC
  Administered 2016-05-17 – 2016-06-03 (×17): 1 via ORAL
  Filled 2016-05-17 (×19): qty 1

## 2016-05-17 MED ORDER — IOPAMIDOL (ISOVUE-300) INJECTION 61%
INTRAVENOUS | Status: AC
  Administered 2016-05-17: 75 mL
  Filled 2016-05-17: qty 75

## 2016-05-17 NOTE — Progress Notes (Addendum)
STROKE TEAM PROGRESS NOTE   SUBJECTIVE (INTERVAL HISTORY) Patient is neurologically stable. She has no complaints. CT venogram shows no definite evidence of cortical and sagittal sinus thrombosis.   OBJECTIVE Temp:  [97.9 F (36.6 C)-98.5 F (36.9 C)] 97.9 F (36.6 C) (07/11 0455) Pulse Rate:  [75-77] 77 (07/11 0455) Cardiac Rhythm:  [-] Normal sinus rhythm (07/11 0708) Resp:  [12-18] 18 (07/11 0455) BP: (123-125)/(73-76) 125/76 mmHg (07/11 0455) SpO2:  [100 %] 100 % (07/11 0455) Weight:  [173 lb 8 oz (78.699 kg)] 173 lb 8 oz (78.699 kg) (07/11 0455)  CBC:   Recent Labs Lab 05/16/16 0340 05/17/16 0415  WBC 7.3 6.7  HGB 7.7* 7.8*  HCT 24.8* 24.4*  MCV 86.7 85.6  PLT 313 357    Basic Metabolic Panel:   Recent Labs Lab 05/16/16 0340 05/17/16 0415  NA 137 135  K 4.2 4.2  CL 107 104  CO2 25 24  GLUCOSE 82 92  BUN 11 10  CREATININE 1.27* 1.15*  CALCIUM 9.2 9.7    Lipid Panel:     Component Value Date/Time   CHOL 180 05/11/2016 0518   TRIG 108 05/11/2016 0518   HDL 46 05/11/2016 0518   CHOLHDL 3.9 05/11/2016 0518   VLDL 22 05/11/2016 0518   LDLCALC 112* 05/11/2016 0518   HgbA1c:  Lab Results  Component Value Date   HGBA1C 5.4 05/11/2016     IMAGING  Dg Chest 2 View 05/10/2016  Enlarging loculated right pleural fluid collection, possibly reaccumulation of an empyema. Probable airspace opacification in the right lung. Electronically Signed   By: Leanna Battles M.D.   On: 05/10/2016 16:00   Ct Head Wo Contrast 05/14/2016 1. Continued interval evolution of acute right MCA territory infarct with associated small volume petechial hemorrhage. Similar localized edema without significant mass effect or midline shift. 2. No new intracranial process.  05/13/2016 Evolution of the right frontal lobe infarct with a new area of hemorrhage within this infarct. No hemorrhage seen elsewhere. No new infarct seen elsewhere. No midline shift. No subdural or epidural fluid  collections.  05/10/2016  SUBTLE AREA OF LOW DENSITY AND LOSS OF GRAY-WHITE DIFFERENTIATION IN THE ANTERIOR RIGHT FRONTAL LOBE COMPATIBLE WITH ACUTE INFARCTION.  02/10/2016 No acute intracranial abnormality or acute abnormality of the cervical spine. Sinus disease is likely related to intubation and OG tube placement. The patient's OG tube is looped in the pharynx and mouth.  Mr Brain Wo Contrast 05/10/2016  Subacute to chronic appearing RIGHT frontoparietal lobe infarct in RIGHT MCA distribution with petechial hemorrhage. Findings less likely represent venous infarct. 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   Ct Angio head & Neck W Or Wo Contrast 05/11/2016   Progression of cytotoxic edema compared with the initial CT from 05/10/2016 in the RIGHT frontal lobe. Moderate irregularity of the A3 and A4 segments, anterior cerebral artery on the RIGHT, correlates with the observed pattern of restricted diffusion on MR; considerations would include vasculitis, intracranial atherosclerosis, or vasospasm. No intracranial large vessel occlusion or extracranial stenosis of significance. No findings to support cortical venous or dural venous thrombosis. Suspected recurrent empyema in the RIGHT chest, 9 x 10 cm cross-section with air-fluid level. Recommend CT chest with contrast for further evaluation.   2D Echocardiogram  02/25/2016 - Left ventricle: The cavity size was normal. Systolic function was normal. The estimated ejection fraction was in the range of 60% to 65%. Wall motion was normal;  there were no regional wall motion abnormalities. Left ventricular diastolic function parameters were normal. - Aortic valve: Trileaflet; mildly thickened leaflets. Valve area (VTI): 1.91 cm^2. Valve area (Vmax): 2.11 cm^2. Valve area (Vmean): 2.14 cm^2. - Right atrium: Mobile linear density seen in the right atrium.  This most likely represents a  catheter. There is a bright target off of this that is highly mobile and concerning for possible thrombus or vegetation. This is not seen in all views. Recommend TEE for further evaluation is clinically indicated. - Pulmonary arteries: PA peak pressure: 39 mm Hg (S). Impressions:   The right ventricular systolic pressure was increased consistent with mild pulmonary hypertension.  TCD bubble study - negative for PFO  LE venous doppler - negative for DVT   Ct Head Wo Contrast 05/13/2016  IMPRESSION: Evolution of the right frontal lobe infarct with a new area of hemorrhage within this infarct. No hemorrhage seen elsewhere. No new infarct seen elsewhere. No midline shift. No subdural or epidural fluid collections.   EEG This is an abnormal EEG due to focal right hemispheric slowing. This is consistent with known stroke in that region. No seizures or epileptiform discharges are seen on this study.   TEE 05/14/16 all valve look fine with no vegetation, no LV or LA or LAA thrombus, normal LV function. Ascending aorta looks normal   PHYSICAL EXAM General - Well nourished, well developed, in no apparent distress.  Ophthalmologic - Fundi not visualized due to noncooperation.  Cardiovascular - Regular rate and rhythm.  Mental Status -  Level of arousal and orientation to time, place, and person were intact. Language including expression, naming, repetition, comprehension was assessed and found intact,  Fund of Knowledge was assessed and was intact.  Cranial Nerves II - XII - II - Visual field intact OU. III, IV, VI - Extraocular movements intact. V - Facial sensation intact bilaterally. VII - Facial movement intact bilaterally. VIII - Hearing & vestibular intact bilaterally. X - Palate elevates symmetrically. XI - Chin turning & shoulder shrug intact bilaterally XII - Tongue protrusion intact.  Motor Strength - The patient's strength was 5/5 RUE and RLE, but 1/5 LUE and LLE. Able to move left  fingers today  Bulk was normal and fasciculations were absent.   Motor Tone - Muscle tone was assessed at the neck and appendages and was normal.  Reflexes - The patient's reflexes were 1+ in all extremities and she had left positive babinski.  Sensory - Light touch, temperature/pinprick were assessed and were decreased on LLE    Coordination - The patient had normal movements in the right hand with no ataxia or dysmetria.  Tremor was absent.  Gait and Station - not tested due to weakness.   ASSESSMENT/PLAN [name] is a 35 y.o. female admitted 3 mos ago after drinking drano and jumping from a 5th floor window who developed aphasia in the hospital 05/10/2016 with decreased movements L side. She did not receive IV t-PA due to unknown LKW, recent trauma.    Cryptogenic stroke with hemorrhagic transformation - R ACA and MCA/ACA subcortical hemorrhagic lesion of unclear age. Etiology possibly delayed hemorrhagic contusion from her fall versus cryptogenic infarct of unknown cause Resultant  Left hemiplegia  MRI  R ACA and MCA/ACA  Subacute hemorrhagic lesion ? Infarct versus contusion. Severe diffuse axonal injury.  CTA head & neck  Irregular R ACA A3-A4 segments, no carotid dissection  Repeat CT head showed right frontal hemorrhagic transformation  Repeat CT  head stable petechial hemorrhage transformation  Check CTV  LE venous Doppler negative for DVT  TCD bubble study  negative for PFO  2D Echo 02/25/16 - R atrium bright target, highly mobile, concerning for possible thrombus/vegetation  TEE - 05/14/16 - all valve look fine with no vegetation, no LV or LA or LAA thrombus, normal LV function. Ascending aorta looks normal  LDL 112   HgbA1c 5.4  Hypercoagulable labls positive lupus anticoagulant and mildly positive HPP and cardiolipin IgM - need to repeat in 3 months or so    SCDs for VTE prophylaxis Diet regular Room service appropriate?: Yes; Fluid consistency:: Thin  No  antithrombotic prior to admission, now on no antithrombotics due to hemorrhagic transformation, bloody pleural effusion and dropping Hb requiring blood transfusion. Will consider ASA once hemodynamically stable and cleared by CTS.   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  New onset seizure night of 05/12/16, most likely related to stroke with hemorrhagic transformation  Treated with ativan acutely and vimpat load  Continue Vimpat 100mg  bid  EEG right slowing, no seizure  Seizure precautions  Anemia  Hb drop 7.8->6.4->transfusion->7.7  s/p blood transfusion  Hold off ASA for now  Consider to restart ASA once anemia improves and hemodynamically stable and cleared by CTS  BP management  Stable   Due to hemorrhagic transformation, BP goal < 160  Avoid hypotension   Hyperlipidemia  Home meds:  No statin  LDL 112, goal < 70  Now on lipitor 20  Continue statin at discharge  Other Stroke Risk Factors  Overweight, Body mass index is 24.21 kg/(m^2).   Other Active Problems  Trauma s/p suicidal attempt and fall  Suicidal attempt  Depression on zoloft  R empyema s/p right thracotomy  Hospital day # [id number]  I have personally examined this patient, reviewed notes, independently viewed imaging studies, participated in medical decision making and plan of care. I have made any additions or clarifications directly to the above note.  The exact etiology of her right frontal hemorrhagic lesion is unclear. Her neurological exam with increased tone and a sustained clonus in the left leg argues towards the more chronic timeframe for that lesion which would favor delayed hemorrhagic contusion from her initial fall. Patient did have a seizure 10 days ago but neurological exam and imaging characteristics of the right frontal lesion seen a lot older. Continue Keppra for seizures and  aspirin for stroke prevention. Follow-up as an outpatient with Dr. Roda ShuttersXu in 2 months. Stroke team will sign off. Kindly call for questions. Greater than 50% time during this 25 minute visit was spent on counseling and coordination of care about her brain lesion, seizures, stroke and answered questions Delia HeadyPramod Sethi, MD Medical Director [facility] Pager: [phone] 05/18/2016 12:57 PM Addendum : Discussed CT scan and prior MRI films with neuroradiologist Dr. Bridgett LarssonSteve Olson. Enhancement in the right frontal lesion would be outside the typical timeframe for old hemorrhagic contusion hence recommend checking MRI scan of the brain with and without contrast to rule out cerebritis Delia HeadyPramod sethi, MD   To contact Stroke Continuity provider, please refer to [email or web address]. After hours, contact General Neurology

## 2016-05-17 NOTE — Progress Notes (Signed)
Tx patient to 2W08, receiving RN at bedside, belongings at bedside, suicide sitter at bedside. No further questions from patient or receiving RN.  Hermina BartersBOWMAN, Gerron Guidotti M, RN

## 2016-05-17 NOTE — Progress Notes (Signed)
Speech Language Pathology Treatment: Dysphagia;Cognitive-Linquistic  Patient Details Name: [name] Bell MRN: [id number] DOB: 07-Jul-1981 Today's Date: 05/17/2016 Time: [id number] SLP Time Calculation (min) (ACUTE ONLY): 8 min  Assessment / Plan / Recommendation Clinical Impression  Pt was able to tolerate a more upright positioning for PO intake, although still minimally reclined due to pain with raising HOB. She consumed regular textures and thin liquids, using lingual sweep to get trace amounts of residue from her teeth and buccal cavities independently. No oral residue remaining after pt's self-monitoring and correcting was completed. She attempted self-feeding with her left hand with Min cues for basic problem solving to use her right hand for support. Her speech is much more fluent today and with more appropriate grammatical structure. Will advance diet to regular textures and thin liquids; no SLP f/u indicated for swallowing. Will f/u for additional diagnostic tx focused on higher level cognitive and communicative skills.   HPI HPI: [name] s/p attempted suicide: taped DNR note to chest, drank Drano, then jumped off 5 story building ([address]).S/p 02/09/16 gel foam embolization of bil internal iliac arteries.S/p 02/09/16 ex lap with cholecystectomy, resection SB and mesenteric repair, liver lac repair, application or wound vac.S/p 02/09/16 closed reduction of pelvic ring, external pelvic fixation.Also has right metacarpal fracture in splint. Pt on and off intubation via trach/ and on/off trac collar. Pelvic x- fixator pins removed at bedside on 03/23/16. Underwent thoracotomy 6/19 due to empyema/abcess with 2 chest tubes, one d/c 04/30/16, second d/c 05/05/16 and pt no longer has trach. Pt with no significant PMHx listed in the chart. Pt had acute speech and left sided weakness changes on7/3/17 and both CT and MRI confirmed stroke (R frontoparietal).        SLP Plan  Goals updated     Recommendations  Diet recommendations: Regular;Thin liquid Liquids provided via: Cup;Straw Medication Administration: Whole meds with liquid Supervision: Patient able to self feed Compensations: Slow rate;Small sips/bites Postural Changes and/or Swallow Maneuvers: Seated upright 90 degrees             Oral Care Recommendations: Oral care BID Follow up Recommendations: Inpatient Rehab Plan: Goals updated     GO               Maxcine HamLaura Paiewonsky, M.A. CCC-SLP [phone]  [name], Curlie Sittner 05/17/2016, 3:09 PM

## 2016-05-17 NOTE — Progress Notes (Signed)
STROKE TEAM PROGRESS NOTE   SUBJECTIVE (INTERVAL HISTORY) Patient accompanied by mom at bedside. No seizure overnight. Still has left hemiplegia but able to move fingers now. Hct and vitals  stable   OBJECTIVE Temp:  [98.2 F (36.8 C)-98.5 F (36.9 C)] 98.2 F (36.8 C) (07/10 0941) Pulse Rate:  [76-86] 80 (07/10 0941) Cardiac Rhythm:  [-] Normal sinus rhythm (07/10 0941) Resp:  [12-28] 16 (07/10 0941) BP: (118-145)/(57-95) 139/88 mmHg (07/10 0941) SpO2:  [99 %-100 %] 100 % (07/10 0941) Weight:  [181 lb 3.5 oz (82.2 kg)] 181 lb 3.5 oz (82.2 kg) (07/10 0530)  CBC:   Recent Labs Lab 05/16/16 0340 05/17/16 0415  WBC 7.3 6.7  HGB 7.7* 7.8*  HCT 24.8* 24.4*  MCV 86.7 85.6  PLT 313 357    Basic Metabolic Panel:   Recent Labs Lab 05/16/16 0340 05/17/16 0415  NA 137 135  K 4.2 4.2  CL 107 104  CO2 25 24  GLUCOSE 82 92  BUN 11 10  CREATININE 1.27* 1.15*  CALCIUM 9.2 9.7    Lipid Panel:     Component Value Date/Time   CHOL 180 05/11/2016 0518   TRIG 108 05/11/2016 0518   HDL 46 05/11/2016 0518   CHOLHDL 3.9 05/11/2016 0518   VLDL 22 05/11/2016 0518   LDLCALC 112* 05/11/2016 0518   HgbA1c:  Lab Results  Component Value Date   HGBA1C 5.4 05/11/2016     IMAGING I have personally reviewed the radiological images below and agree with the radiology interpretations.  Dg Chest 2 View 05/10/2016  Enlarging loculated right pleural fluid collection, possibly reaccumulation of an empyema. Probable airspace opacification in the right lung. Electronically Signed   By: Leanna BattlesMelinda  Blietz M.D.   On: 05/10/2016 16:00   Ct Head Wo Contrast 05/14/2016 1. Continued interval evolution of acute right MCA territory infarct with associated small volume petechial hemorrhage. Similar localized edema without significant mass effect or midline shift. 2. No new intracranial process.  05/13/2016 Evolution of the right frontal lobe infarct with a new area of hemorrhage within this infarct. No  hemorrhage seen elsewhere. No new infarct seen elsewhere. No midline shift. No subdural or epidural fluid collections.  05/10/2016  SUBTLE AREA OF LOW DENSITY AND LOSS OF GRAY-WHITE DIFFERENTIATION IN THE ANTERIOR RIGHT FRONTAL LOBE COMPATIBLE WITH ACUTE INFARCTION.  02/10/2016 No acute intracranial abnormality or acute abnormality of the cervical spine. Sinus disease is likely related to intubation and OG tube placement. The patient's OG tube is looped in the pharynx and mouth.  Mr Brain Wo Contrast 05/10/2016  Subacute to chronic appearing RIGHT frontoparietal lobe infarct in RIGHT MCA distribution with petechial hemorrhage. Findings less likely represent venous infarct. 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   Ct Angio head & Neck W Or Wo Contrast 05/11/2016   Progression of cytotoxic edema compared with the initial CT from 05/10/2016 in the RIGHT frontal lobe. Moderate irregularity of the A3 and A4 segments, anterior cerebral artery on the RIGHT, correlates with the observed pattern of restricted diffusion on MR; considerations would include vasculitis, intracranial atherosclerosis, or vasospasm. No intracranial large vessel occlusion or extracranial stenosis of significance. No findings to support cortical venous or dural venous thrombosis. Suspected recurrent empyema in the RIGHT chest, 9 x 10 cm cross-section with air-fluid level. Recommend CT chest with contrast for further evaluation.   2D Echocardiogram  02/25/2016 - Left ventricle: The cavity size was normal. Systolic  function was normal. The estimated ejection fraction was in the range of 60% to 65%. Wall motion was normal; there were no regional wall motion abnormalities. Left ventricular diastolic function parameters were normal. - Aortic valve: Trileaflet; mildly thickened leaflets. Valve area (VTI): 1.91 cm^2. Valve area (Vmax): 2.11 cm^2. Valve area (Vmean): 2.14  cm^2. - Right atrium: Mobile linear density seen in the right atrium.  This most likely represents a catheter. There is a bright target off of this that is highly mobile and concerning for possible thrombus or vegetation. This is not seen in all views. Recommend TEE for further evaluation is clinically indicated. - Pulmonary arteries: PA peak pressure: 39 mm Hg (S). Impressions:   The right ventricular systolic pressure was increased consistent with mild pulmonary hypertension.  TCD bubble study - negative for PFO  LE venous doppler - negative for DVT   Ct Head Wo Contrast 05/13/2016  IMPRESSION: Evolution of the right frontal lobe infarct with a new area of hemorrhage within this infarct. No hemorrhage seen elsewhere. No new infarct seen elsewhere. No midline shift. No subdural or epidural fluid collections.   EEG This is an abnormal EEG due to focal right hemispheric slowing. This is consistent with known stroke in that region. No seizures or epileptiform discharges are seen on this study.   TEE 05/14/16 all valve look fine with no vegetation, no LV or LA or LAA thrombus, normal LV function. Ascending aorta looks normal   PHYSICAL EXAM General - Well nourished, well developed, in no apparent distress.  Ophthalmologic - Fundi not visualized due to noncooperation.  Cardiovascular - Regular rate and rhythm.  Mental Status -  Level of arousal and orientation to time, place, and person were intact. Language including expression, naming, repetition, comprehension was assessed and found intact,  Fund of Knowledge was assessed and was intact.  Cranial Nerves II - XII - II - Visual field intact OU. III, IV, VI - Extraocular movements intact. V - Facial sensation intact bilaterally. VII - Facial movement intact bilaterally. VIII - Hearing & vestibular intact bilaterally. X - Palate elevates symmetrically. XI - Chin turning & shoulder shrug intact bilaterally XII - Tongue protrusion  intact.  Motor Strength - The patient's strength was 5/5 RUE and RLE, but 2/5 LUE and LLE. Able to move left fingers today  Bulk was normal and fasciculations were absent.   Motor Tone - Muscle tone increased LLE with illsustained ankle clonus on left.  Reflexes - The patient's reflexes were 3+ on left and 2+ on right and she had left positive babinski.  Sensory - Light touch, temperature/pinprick were assessed and were decreased on LLE and LUE    Coordination - The patient had normal movements in the right hand with no ataxia or dysmetria.  Tremor was absent.  Gait and Station - not tested due to weakness.   ASSESSMENT/PLAN Ms. SAREENA ODEH is a 35 y.o. female admitted 3 mos ago after drinking drano and jumping from a 5th floor window who developed aphasia in the hospital with decreased movements L side. She did not receive IV t-PA due to unknown LKW, recent trauma.   Cryptogenic stroke with hemorrhagic transformation - R ACA and MCA/ACA acute infarct with hemorrhagic transformation, secondary to unknown etiology.  Resultant  Left hemiplegia  MRI  R ACA and MCA/ACA infarct with petechial hemorrhage.    CTA head & neck  Irregular R ACA A3-A4 segments, no carotid dissection  Repeat CT head showed right frontal hemorrhagic  transformation  Repeat CT head stable petechial hemorrhage transformation  LE venous Doppler negative for DVT  TCD bubble study pending negative for PFO  2D Echo 02/25/16 - R atrium bright target, highly mobile, concerning for possible thrombus/vegetation  TEE - 05/14/16 - all valve look fine with no vegetation, no LV or LA or LAA thrombus, normal LV function. Ascending aorta looks normal  LDL 112   HgbA1c 5.4  Hypercoagulable labls positive lupus anticoagulant and mildly positive HPP and cardiolipin IgM - need to repeat in 3 months or so    SCDs for VTE prophylaxis DIET DYS 3 Room service appropriate?: Yes; Fluid consistency:: Thin  No antithrombotic  prior to admission, now on no antithrombotics due to hemorrhagic transformation, bloody pleural effusion and dropping Hb requiring blood transfusion. Will consider ASA once hemodynamically stable and cleared by CTS.   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  New onset seizure night of 05/12/16, most likely related to stroke with hemorrhagic transformation  Treated with ativan acutely and vimpat load  Continue Vimpat 100mg  bid  EEG right slowing, no seizure  Seizure precautions  Anemia  Hb drop 7.8->6.4->transfusion->7.7  s/p blood transfusion  Hold off ASA for now  Consider to restart ASA once anemia improves and hemodynamically stable and cleared by CTS  BP management  Stable   Due to hemorrhagic transformation, BP goal < 160  Avoid hypotension   Hyperlipidemia  Home meds:  No statin  LDL 112, goal < 70  Now on lipitor 20  Continue statin at discharge  Other Stroke Risk Factors  Overweight, Body mass index is 25.29 kg/(m^2).   Other Active Problems  Trauma s/p suicidal attempt and fall  Suicidal attempt  Depression on zoloft  R empyema s/p right thracotomy  Hospital day # 26  I spent a total of 25  Minutes in face to face in clinical consultation, greater than 50% of which was counseling/coordinating care about her subacute right brain hemorrhagic infarct, discussion of possible etiologies and answering questions from patient and mother.Recommend check CT venogram for evaluation of cerebral venous sinuses as her infarct may represent cortical venous infarct from a delayed effect of her head injury   Delia Heady, MD Stroke Neurology 05/17/2016 1:04 PM    To contact Stroke Continuity provider, please refer to [email or web address]. After hours, contact General Neurology

## 2016-05-17 NOTE — Progress Notes (Signed)
Trauma Service Note  Subjective: Patient looks great this AM.  Very animated.  Upbeat.  No acute distress.  Objective: Vital signs in last 24 hours: Temp:  [98.2 F (36.8 C)-98.5 F (36.9 C)] 98.2 F (36.8 C) (07/09 2337) Pulse Rate:  [77-96] 77 (07/10 0700) Resp:  [10-28] 12 (07/10 0700) BP: (118-145)/(57-95) 139/95 mmHg (07/10 0700) SpO2:  [97 %-100 %] 100 % (07/10 0700) Weight:  [82.2 kg (181 lb 3.5 oz)] 82.2 kg (181 lb 3.5 oz) (07/10 0530) Last BM Date: 05/11/16  Intake/Output from previous day: 07/09 0701 - 07/10 0700 In: 1317 [P.O.:820; I.V.:312; IV Piggyback:185] Out: 1400 [Urine:1300; Chest Tube:100] Intake/Output this shift:    General: No acute distress.  Lungs: Breath sounds heard on both sides.  Abd: Soft, Benign, excellent bowel sounds.  Extremities: Some atrophy.    Neuro: Moving left arm with 2-3/5 strength.  Still not moving her left leg.  Able to spek much morre clearly.  Lab Results: CBC   Recent Labs  05/16/16 0340 05/17/16 0415  WBC 7.3 6.7  HGB 7.7* 7.8*  HCT 24.8* 24.4*  PLT 313 357   BMET  Recent Labs  05/16/16 0340 05/17/16 0415  NA 137 135  K 4.2 4.2  CL 107 104  CO2 25 24  GLUCOSE 82 92  BUN 11 10  CREATININE 1.27* 1.15*  CALCIUM 9.2 9.7   PT/INR No results for input(s): LABPROT, INR in the last 72 hours. ABG  Recent Labs  05/15/16 0345  PHART 7.375  HCO3 24.3*    Studies/Results: Dg Chest Port 1 View  05/17/2016  CLINICAL DATA:  Empyema.  Right chest tube in place. EXAM: PORTABLE CHEST 1 VIEW COMPARISON:  05/16/2016 and 05/15/2016 and 05/14/2016 FINDINGS: Right-sided chest tube and central venous catheter remain unchanged in position. No pneumothorax. Multiple right lateral rib fractures are noted with unchanged right lateral pleural thickening extending over the apex. The left lung is clear.  Heart size and vascularity are normal. IMPRESSION: No change since the prior study. Persistent right lateral pleural  thickening. Electronically Signed   By: Francene BoyersJames  Maxwell M.D.   On: 05/17/2016 07:30   Dg Chest Port 1 View  05/16/2016  CLINICAL DATA:  Chest tube in place. Status post right pleural space evacuation 2 days prior. EXAM: PORTABLE CHEST 1 VIEW COMPARISON:  Chest radiograph from one day prior. FINDINGS: Right internal jugular central venous catheter terminates in the lower third of the superior vena cava. Right apical chest tube is stable in position. Skin staples are noted in the lateral lower right chest wall. Stable cardiomediastinal silhouette with normal heart size. No pneumothorax. Pleural thickening throughout the peripheral right pleural space appears slightly increased. No left pleural effusion. Numerous right lateral rib fractures are again noted. Hazy opacities throughout the right lung appear stable. No pulmonary edema. IMPRESSION: 1. Increased pleural thickening throughout the peripheral right pleural space, cannot exclude recurrent hemothorax or loculated pleural effusion. 2. No pneumothorax. 3. Stable hazy right lung opacities, favor atelectasis. 4. Multiple right rib fractures. Electronically Signed   By: Delbert PhenixJason A Poff M.D.   On: 05/16/2016 07:38    Anti-infectives: Anti-infectives    Start     Dose/Rate Route Frequency Ordered Stop   05/15/16 1430  cefUROXime (ZINACEF) 1.5 g in dextrose 5 % 50 mL IVPB     1.5 g 100 mL/hr over 30 Minutes Intravenous Every 12 hours 05/15/16 1042 05/17/16 0255   05/14/16 1400  cefUROXime (ZINACEF) 1.5 g in dextrose 5 %  50 mL IVPB     1.5 g 100 mL/hr over 30 Minutes Intravenous Every 12 hours 05/14/16 1255 05/15/16 0300   05/10/16 1230  sulfamethoxazole-trimethoprim (BACTRIM DS,SEPTRA DS) 800-160 MG per tablet 1 tablet     1 tablet Oral Every 12 hours 05/10/16 1122 05/21/16 0959   05/07/16 1300  ciprofloxacin (CIPRO) tablet 500 mg  Status:  Discontinued     500 mg Oral 2 times daily 05/07/16 1200 05/10/16 1122   05/06/16 1700  ceFEPIme (MAXIPIME) 2 g in  dextrose 5 % 50 mL IVPB  Status:  Discontinued     2 g 100 mL/hr over 30 Minutes Intravenous Every 8 hours 05/06/16 1316 05/07/16 1200   04/27/16 1000  ceFEPIme (MAXIPIME) 2 g in dextrose 5 % 50 mL IVPB  Status:  Discontinued     2 g 100 mL/hr over 30 Minutes Intravenous Every 12 hours 04/27/16 0818 05/06/16 1316   04/22/16 1200  piperacillin-tazobactam (ZOSYN) IVPB 3.375 g  Status:  Discontinued     3.375 g 12.5 mL/hr over 240 Minutes Intravenous Every 6 hours 04/22/16 1039 04/22/16 1042   04/22/16 1200  vancomycin (VANCOCIN) 1,500 mg in sodium chloride 0.9 % 500 mL IVPB  Status:  Discontinued     1,500 mg 250 mL/hr over 120 Minutes Intravenous Every 12 hours 04/22/16 1133 04/28/16 0115   04/22/16 1130  piperacillin-tazobactam (ZOSYN) IVPB 3.375 g  Status:  Discontinued     3.375 g 12.5 mL/hr over 240 Minutes Intravenous Every 8 hours 04/22/16 1043 04/27/16 0754   04/02/16 1200  fluconazole (DIFLUCAN) 40 MG/ML suspension 400 mg     400 mg Per Tube  Once 04/02/16 1026 04/02/16 1442   03/31/16 1000  sulfamethoxazole-trimethoprim (BACTRIM,SEPTRA) 200-40 MG/5ML suspension 40 mL  Status:  Discontinued     40 mL Per Tube Every 12 hours 03/31/16 0927 03/31/16 0956   03/31/16 1000  sulfamethoxazole-trimethoprim (BACTRIM,SEPTRA) 200-40 MG/5ML suspension 20 mL     20 mL Per Tube Every 6 hours 03/31/16 0956 04/03/16 2359   03/26/16 1030  cefTRIAXone (ROCEPHIN) 2 g in dextrose 5 % 50 mL IVPB  Status:  Discontinued     2 g 100 mL/hr over 30 Minutes Intravenous Every 24 hours 03/26/16 1023 03/31/16 0921   03/25/16 1000  ceFEPIme (MAXIPIME) 2 g in dextrose 5 % 50 mL IVPB  Status:  Discontinued     2 g 100 mL/hr over 30 Minutes Intravenous Every 12 hours 03/25/16 0829 03/26/16 1023   03/08/16 1400  levofloxacin (LEVAQUIN) IVPB 750 mg     750 mg 100 mL/hr over 90 Minutes Intravenous Every 24 hours 03/08/16 0912 03/10/16 1501   03/06/16 1400  ceFEPIme (MAXIPIME) 2 g in dextrose 5 % 50 mL IVPB  Status:   Discontinued     2 g 100 mL/hr over 30 Minutes Intravenous Every 12 hours 03/06/16 1344 03/08/16 0912   03/01/16 1330  ceFAZolin (ANCEF) IVPB 2g/100 mL premix     2 g 200 mL/hr over 30 Minutes Intravenous To [facility] 02/29/16 0917 03/01/16 1452   02/11/16 0600  piperacillin-tazobactam (ZOSYN) IVPB 3.375 g  Status:  Discontinued     3.375 g 100 mL/hr over 30 Minutes Intravenous 4 times per day 02/10/16 2214 02/22/16 0853   02/10/16 1730  piperacillin-tazobactam (ZOSYN) IVPB 3.375 g  Status:  Discontinued     3.375 g 12.5 mL/hr over 240 Minutes Intravenous 3 times per day 02/10/16 1727 02/10/16 2214      Assessment/Plan: s/p  Procedure(s): RIGHT VIDEO ASSISTED THORACOSCOPY (VATS),DRAINAGE OF EMPYEMA TRANSESOPHAGEAL ECHOCARDIOGRAM (TEE) EVACUATION HEMATOMA Advance diet Transfer to 2W.  LOS: 98 days   Marta Lamas. Gae Bon, MD, FACS [phone] Trauma Surgeon 05/17/2016

## 2016-05-17 NOTE — Progress Notes (Signed)
Occupational Therapy Treatment Patient Details Name: [name] Bell MRN: [id number] DOB: 1980-12-30 Today's Date: 05/17/2016    History of present illness [name] Bell is a 35 y.o. female [facility]16 s/p attempted suicide: taped DNR note to chest, drank Drano, then jumped off 5 story building ([address]S/p 02/09/16 gel foam embolization of bil internal iliac arteries.S/p 02/09/16 ex lap with cholecystectomy, resection SB and mesenteric repair, liver lac repair, application or wound vac.S/p 02/09/16 closed reduction of pelvic ring, external pelvic fixation.Also has right metacarpal fracture in splint. Pt on and off intubation via trach/ and on/off trac collar.  Pelvic x- fixator pins removed at bedside on 03/23/16.  Underwent thoracotomy 6/19 due to empyema/abcess with 2 chest tubes, one d/c 04/30/16, second d/c 05/05/16 and pt no longer has trach.  Pt with no significant PMHx listed in the chart. Pt had acute speech and left sided weakness changes on7/3/17 and both CT and MRI confirmed stroke (R frontoparietal).  Thoracotomy and evac of hematoma 05/14/16.     OT comments  Pt declined EOB, stating she had an impending CT scan. Focus of session on strengthening L UE and functional reach and self feeding. L UE grossly 3/5 strength. Tolerated well. Pt in better spirits.  Follow Up Recommendations  CIR;Supervision/Assistance - 24 hour    Equipment Recommendations       Recommendations for Other Services      Precautions / Restrictions Precautions Precautions: Fall Precaution Comments: left hemiparesis, chest tube Restrictions RUE Weight Bearing: Weight bearing as tolerated LUE Weight Bearing: Weight bearing as tolerated RLE Weight Bearing: Weight bearing as tolerated LLE Weight Bearing: Weight bearing as tolerated       Mobility Bed Mobility                  Transfers                      Balance                                   ADL  Overall ADL's : Needs assistance/impaired Eating/Feeding: Set up;Bed level Eating/Feeding Details (indicate cue type and reason): Pt self fed finger foods and drank from straw with R hand, used L hand to reach inside bag for pretzels.                                   General ADL Comments: Worked on reaching for ADL objects in multiple planes with L UE. Completed theraputty exercises with L hand.      Vision                     Perception     Praxis      Cognition   Behavior During Therapy: [facility]FL for tasks assessed/performed Overall Cognitive Status: Impaired/Different from baseline     Current Attention Level: Selective    Following Commands: Follows one step commands consistently     Problem Solving: Slow processing;Requires verbal cues General Comments: Pt reports she sometimes forgets how to open the container of putty.    Extremity/Trunk Assessment               Exercises     Shoulder Instructions       General Comments      Pertinent Vitals/  Pain       Pain Assessment: IV site, grimaces when touched  Home Living                                          Prior Functioning/Environment              Frequency Min 3X/week     Progress Toward Goals  OT Goals(current goals can now be found in the care plan section)  Progress towards OT goals: Progressing toward goals  Acute Rehab OT Goals Time For Goal Achievement: 05/25/16 Potential to Achieve Goals: Good  Plan Discharge plan remains appropriate    Co-evaluation                 End of Session     Activity Tolerance Patient tolerated treatment well (pt self limiting)   Patient Left in bed;with call bell/phone within reach;with nursing/sitter in room   Nurse Communication          Time: 7829-5621 OT Time Calculation (min): 23 min  Charges: OT General Charges $OT Visit: 1 Procedure OT Treatments $Neuromuscular Re-education: 23-37  mins  Evern Bio 05/17/2016, 3:08 PM  [phone]

## 2016-05-17 NOTE — Progress Notes (Signed)
Physical Therapy Treatment Patient Details Name: [name] Bell Bell MRN: [id number] DOB: [birth year] Today's Date: 05/17/2016    History of Present Illness [name] Bell is a 35 y.o. female [facility] 02/09/16 s/p attempted suicide: taped DNR note to chest, drank Drano, then [place]S/p 02/09/16 gel foam embolization of bil internal iliac arteries.S/p 02/09/16 ex lap with cholecystectomy, resection SB and mesenteric repair, liver lac repair, application or wound vac.S/p 02/09/16 closed reduction of pelvic ring, external pelvic fixation.Also has right metacarpal fracture in splint. Pt on and off intubation via trach/ and on/off trac collar.  Pelvic x- fixator pins removed at bedside on 03/23/16.  Underwent thoracotomy 6/19 due to empyema/abcess with 2 chest tubes, one Bell/c 04/30/16, second Bell/c 05/05/16 and pt no longer has trach.  Pt with no significant PMHx listed in the chart. Pt had acute speech and left sided weakness changes on7/3/17 and both CT and MRI confirmed stroke (R frontoparietal).  Thoracotomy and evac of hematoma 05/14/16.      PT Comments    Pt's cogitation and speech has improved since Friday. Some sensory and motor control has returned to her LUE but she is still unable to move the LLE. She was able to perform shoulder flexion on the L with considerable effort but was unable to hold her arm straight during exercise. She attempted moving into long sitting in bed with HOB elevated but was limited due to weakness and pain.   Follow Up Recommendations  CIR     Equipment Recommendations  Wheelchair (measurements PT);Wheelchair cushion (measurements PT);Hospital bed;Other (comment);3in1 (PT);Rolling walker with 5" wheels    Recommendations for Other Services Rehab consult     Precautions / Restrictions Precautions Precautions: Fall Precaution Comments: left hemiparesis, chest tube Restrictions Weight Bearing Restrictions: Yes RUE Weight  Bearing: Weight bearing as tolerated LUE Weight Bearing: Weight bearing as tolerated RLE Weight Bearing: Weight bearing as tolerated LLE Weight Bearing: Weight bearing as tolerated    Mobility  Bed Mobility               General bed mobility comments: Patient attempted  to move into long sitting from laying in bed with HOB elevated. She was unable to move into long sitting due to pain and weakness.         Cognition Arousal/Alertness: Awake/alert Behavior During Therapy: WFL for tasks assessed/performed Overall Cognitive Status: Impaired/Different from baseline ([place]ificulty with speach)     Current Attention Level: Selective   Following Commands: Follows one step commands consistently   Awareness: Emergent Problem Solving: Requires verbal cues General Comments: Patients attitude was improved today. She was able to state her goals of wanting to do ROM in bed but was not interested in sitting EOB. She was speaking in full sentences with few pauses inbetween words.     Exercises Total Joint Exercises Ankle Circles/Pumps: PROM;AROM;Both;10 reps;Supine (Total assist on L) Heel Slides: PROM;AROM;Both;10 reps;Supine (Total assist on L) General Exercises - Upper Extremity Shoulder Flexion: AROM;AAROM;Both;10 reps;Supine (Min assist on the L. VC & tactile cues to keep elbow straight) General Exercises - Lower Extremity Hip ABduction/ADduction: PROM;AROM;Both;10 reps;Supine (Total assist on L)        Pertinent Vitals/Pain Pain Assessment: 0-10 Pain Score: 6  Pain Location: Right side, where chest tube is inserted Pain Intervention(s): Limited activity within patient's tolerance;Monitored during session;Repositioned           PT Goals (current goals can now be found in the care  plan section) Acute Rehab PT Goals Patient Stated Goal: Move around in bed PT Goal Formulation: With patient Time For Goal Achievement: 05/25/16 Potential to Achieve Goals: Good Progress towards  PT goals: Progressing toward goals    Frequency  Min 5X/week    PT Plan Current plan remains appropriate       End of Session   Activity Tolerance: Patient limited by pain;Patient limited by lethargy Patient left: in bed;with call bell/phone within reach;with nursing/sitter in room (Bed in chair position)     Time: 1100-1120 PT Time Calculation (min) (ACUTE ONLY): 20 min  Charges:                       G CodesKallie Locks, Leda Gauze [phone]  05/17/2016, 12:05 PM

## 2016-05-18 DIAGNOSIS — I63139 Cerebral infarction due to embolism of unspecified carotid artery: Secondary | ICD-10-CM

## 2016-05-18 LAB — GLUCOSE, CAPILLARY
GLUCOSE-CAPILLARY: 104 mg/dL — AB (ref 65–99)
Glucose-Capillary: 117 mg/dL — ABNORMAL HIGH (ref 65–99)

## 2016-05-18 NOTE — Progress Notes (Signed)
Patient ID: [name] Bell, female   DOB: 1981/11/07, 35 y.o.   MRN: [id number]   LOS: 99 days   Subjective: No new c/o., sleeping.   Objective: Vital signs in last 24 hours: Temp:  [97.9 F (36.6 C)-98.5 F (36.9 C)] 97.9 F (36.6 C) (07/11 0455) Pulse Rate:  [75-80] 77 (07/11 0455) Resp:  [12-18] 18 (07/11 0455) BP: (123-139)/(73-88) 125/76 mmHg (07/11 0455) SpO2:  [100 %] 100 % (07/11 0455) Weight:  [78.699 kg (173 lb 8 oz)] 78.699 kg (173 lb 8 oz) (07/11 0455) Last BM Date: 05/11/16   CT No air leak 3940ml/24h @920ml    Laboratory  CBG (last 3)   Recent Labs  05/17/16 1738 05/17/16 2149 05/18/16 0608  GLUCAP 80 120* 104*    Physical Exam General appearance: alert and no distress Resp: clear to auscultation bilaterally Cardio: regular rate and rhythm GI: Soft, wound granulating well, skin level   Assessment/Plan: Jump from hotel/multiple toxic ingestions- Continue on Zoloft, sitter for safety.  S/P ex lap, hepatorraphy, SBR, cholecystectomy, preperitoneal pelvic packing [name] 4/3  Ex lap for acidosis/bleeding 4/4 Thompson Ex lap for bleeding 4/5 [name] S/P ex fix pelvis [name] 4/3 S/P ex lap 4/7 Thompson S/P ex lap 4/10 Thompson S/P sacral screws 4/10 [name] S/P application ABRA, open gastrostomy tube 4/12 [name]  S/P abd closure, trach 4/24 [name] Mult B rib FX/R HPTX, B pulm contusions  Multiple toxic ingestions Mult L wrist FXs - ok to remove splint per Dr. Melvyn Novasrtmann  Pelvic FX - S/P ex fix and angioembolization, S/P sacral screw by Dr. Carola FrostHandy. WBAT BLE. ABL anemia - Stable Right empyema s/p thoracotomy with recurrence s/p right thoracotomy, drainage--CT per CT surgery. Hold blood thinner per CT surgery. On Septra until 7/14. 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 Appreciate neurology F/U  FEN - SL IV, d/c SSI/CBG's VTE - SCD's, holding blood thinners per CT surgery Dispo - Eventual CIR [facility]    Freeman CaldronMichael J. Novi Calia, PA-C Pager: [phone] General Trauma PA Pager: [phone]  05/18/2016

## 2016-05-18 NOTE — Progress Notes (Signed)
Physical Therapy Treatment Patient Details Name: [name] BobKatia D Dickard MRN: [id number] DOB: 11/25/1980 Today's Date: 05/18/2016    History of Present Illness [name] Bell is a 35 y.o. female [facility]/09/16 s/p attempted suicide: taped DNR note to chest, drank Drano, then [place]S/p 02/09/16 gel foam embolization of bil internal iliac arteries.S/p 02/09/16 ex lap with cholecystectomy, resection SB and mesenteric repair, liver lac repair, application or wound vac.S/p 02/09/16 closed reduction of pelvic ring, external pelvic fixation.Also has right metacarpal fracture in splint. Pt on and off intubation via trach/ and on/off trac collar.  Pelvic x- fixator pins removed at bedside on 03/23/16.  Underwent thoracotomy 6/19 due to empyema/abcess with 2 chest tubes, one d/c 04/30/16, second d/c 05/05/16 and pt no longer has trach.  Pt with no significant PMHx listed in the chart. Pt had acute speech and left sided weakness changes on7/3/17 and both CT and MRI confirmed stroke (R frontoparietal).  Thoracotomy and evac of hematoma 05/14/16.      PT Comments    Pt agreeable to sit EOB today, however, limited time EOB due to right leg burning and tingling.  Pt did better overall with bed mobility to the left and sitting up from left side.  She was given HEP handout for exercises for right leg.  Handout will need to be reviewed next session.  Pt continues to be an excellent inpatient rehab candidate and I mentioned that we need to try to get back up on her feet again soon.  She was agreeable to this plan.  PT will check back in tomorrow. Still no movement of left leg despite some good return in her left arm.   Follow Up Recommendations  CIR     Equipment Recommendations  Wheelchair (measurements PT);Wheelchair cushion (measurements PT);Hospital bed;Other (comment);3in1 (PT);Rolling walker with 5" wheels    Recommendations for Other Services Rehab consult      Precautions / Restrictions Precautions Precautions: Fall Precaution Comments: left hemiparesis, chest tube Restrictions RUE Weight Bearing: Weight bearing as tolerated LUE Weight Bearing: Weight bearing as tolerated RLE Weight Bearing: Weight bearing as tolerated LLE Weight Bearing: Weight bearing as tolerated    Mobility  Bed Mobility Overal bed mobility: Needs Assistance Bed Mobility: Rolling;Sidelying to Sit;Sit to Sidelying Rolling: Min assist Sidelying to sit: Mod assist;HOB elevated     Sit to sidelying: Mod assist General bed mobility comments: Verbal cues and manual hand over hand assist to reach for left bedrail, bend right knee and push with right leg and pull with right arm to get to sidelying.  Assist needed to help progress left leg over EOB.  Assist needed to power up off of weak left side and support trunk to get to sitting.        Modified Rankin (Stroke Patients Only) Modified Rankin (Stroke Patients Only) Pre-Morbid Rankin Score: No symptoms Modified Rankin: Severe disability     Balance Overall balance assessment: Needs assistance Sitting-balance support: Feet supported;Single extremity supported Sitting balance-[name]: Fair Sitting balance - Comments: static sitting close supervision.                             Cognition Arousal/Alertness: Awake/alert Behavior During Therapy: WFL for tasks assessed/performed Overall Cognitive Status: Impaired/Different from baseline     Current Attention Level: Selective   Following Commands: Follows one step commands consistently;Follows multi-step commands with increased time (multi step commands with  repetition)     Problem Solving: Slow processing;Difficulty sequencing;Requires verbal cues;Requires tactile cues General Comments: Pt cannot process too much information at one time.  Needs things broken down into simpler commands.     Exercises Total Joint Exercises Ankle Circles/Pumps:  AROM;PROM;Both;10 reps;Supine Heel Slides: AROM;PROM;Both;10 reps;Supine General Exercises - Upper Extremity Shoulder Flexion: AROM;AAROM;Both;10 reps General Exercises - Lower Extremity Short Arc Quad: AROM;Right;10 reps Hip ABduction/ADduction: AROM;AAROM;Both;10 reps        Pertinent Vitals/Pain Pain Assessment: 0-10 Pain Score: 7  Pain Location: right chest tube site, right leg burning. Pain Descriptors / Indicators: Burning Pain Intervention(s): Limited activity within patient's tolerance;Monitored during session;Repositioned;Patient requesting pain meds-RN notified;RN gave pain meds during session           PT Goals (current goals can now be found in the care plan section) Acute Rehab PT Goals Patient Stated Goal: to decrease pain in legs and move around a bit. Progress towards PT goals: Progressing toward goals    Frequency  Min 5X/week    PT Plan Current plan remains appropriate       End of Session   Activity Tolerance: Patient limited by fatigue;Patient limited by pain Patient left: in bed;with call bell/phone within reach;with nursing/sitter in room     Time: 1435-1526 PT Time Calculation (min) (ACUTE ONLY): 51 min  Charges:  $Therapeutic Exercise: 23-37 mins $Therapeutic Activity: 8-22 mins                      Caydyn Sprung B. Cordaryl Decelles, PT, DPT [phone]   05/18/2016, 4:54 PM

## 2016-05-18 NOTE — Progress Notes (Addendum)
      [address]       ,[place] [id number]             [phone]      4 Days Post-Op Procedure(s) (LRB): RIGHT VIDEO ASSISTED THORACOSCOPY (VATS),DRAINAGE OF EMPYEMA (Right) TRANSESOPHAGEAL ECHOCARDIOGRAM (TEE) (N/A) EVACUATION HEMATOMA (Right)   Subjective:  [name] states she is doing okay.  In good spirits this morning  Objective: Vital signs in last 24 hours: Temp:  [97.9 F (36.6 C)-98.5 F (36.9 C)] 97.9 F (36.6 C) (07/11 0455) Pulse Rate:  [75-80] 77 (07/11 0455) Cardiac Rhythm:  [-] Normal sinus rhythm (07/11 0708) Resp:  [12-18] 18 (07/11 0455) BP: (123-139)/(73-88) 125/76 mmHg (07/11 0455) SpO2:  [100 %] 100 % (07/11 0455) Weight:  [173 lb 8 oz (78.699 kg)] 173 lb 8 oz (78.699 kg) (07/11 0455)  Intake/Output from previous day: 07/10 0701 - 07/11 0700 In: 370 [P.O.:360; I.V.:10] Out: 990 [Urine:900; Chest Tube:90]  General appearance: alert, cooperative and no distress Heart: regular rate and rhythm Lungs: diminished breath sounds on right Abdomen: soft, non-tender; bowel sounds normal; no masses,  no organomegaly Extremities: moving R/LUE, RLE... Unable to move LLE, however she states there have been instances where she thought her toes moved Wound: clean and dry  Lab Results:  Recent Labs  05/16/16 0340 05/17/16 0415  WBC 7.3 6.7  HGB 7.7* 7.8*  HCT 24.8* 24.4*  PLT 313 357   BMET:  Recent Labs  05/16/16 0340 05/17/16 0415  NA 137 135  K 4.2 4.2  CL 107 104  CO2 25 24  GLUCOSE 82 92  BUN 11 10  CREATININE 1.27* 1.15*  CALCIUM 9.2 9.7    PT/INR: No results for input(s): LABPROT, INR in the last 72 hours. ABG    Component Value Date/Time   PHART 7.375 05/15/2016 0345   HCO3 24.3* 05/15/2016 0345   TCO2 25.6 05/15/2016 0345   ACIDBASEDEF 0.3 05/15/2016 0345   O2SAT 97.3 05/15/2016 0345   CBG (last 3)   Recent Labs  05/17/16 1738 05/17/16 2149 05/18/16 0608  GLUCAP 80 120* 104*     Assessment/Plan: S/P Procedure(s) (LRB): RIGHT VIDEO ASSISTED THORACOSCOPY (VATS),DRAINAGE OF EMPYEMA (Right) TRANSESOPHAGEAL ECHOCARDIOGRAM (TEE) (N/A) EVACUATION HEMATOMA (Right)  1. Chest tube- 90 cc output- leave chest tube in place  2. ID- continue packing to chest tube site 3. Care per trauma   LOS: 99 days    BARRETT, ERIN 05/18/2016  I have seen and examined the patient and agree with the assessment and plan as outlined.  Purcell Nailslarence H Dell Hurtubise, MD 05/18/2016 12:51 PM

## 2016-05-19 LAB — CBC WITH DIFFERENTIAL/PLATELET
Basophils Absolute: 0 10*3/uL (ref 0.0–0.1)
Basophils Relative: 0 %
EOS ABS: 0.8 10*3/uL — AB (ref 0.0–0.7)
EOS PCT: 10 %
HCT: 26.9 % — ABNORMAL LOW (ref 36.0–46.0)
Hemoglobin: 8.5 g/dL — ABNORMAL LOW (ref 12.0–15.0)
LYMPHS ABS: 1.6 10*3/uL (ref 0.7–4.0)
Lymphocytes Relative: 20 %
MCH: 27.2 pg (ref 26.0–34.0)
MCHC: 31.6 g/dL (ref 30.0–36.0)
MCV: 86.2 fL (ref 78.0–100.0)
MONO ABS: 0.7 10*3/uL (ref 0.1–1.0)
MONOS PCT: 9 %
Neutro Abs: 4.9 10*3/uL (ref 1.7–7.7)
Neutrophils Relative %: 61 %
PLATELETS: 387 10*3/uL (ref 150–400)
RBC: 3.12 MIL/uL — ABNORMAL LOW (ref 3.87–5.11)
RDW: 15.5 % (ref 11.5–15.5)
WBC: 8 10*3/uL (ref 4.0–10.5)

## 2016-05-19 LAB — AEROBIC/ANAEROBIC CULTURE (SURGICAL/DEEP WOUND): CULTURE: NO GROWTH

## 2016-05-19 LAB — GLUCOSE, CAPILLARY: GLUCOSE-CAPILLARY: 117 mg/dL — AB (ref 65–99)

## 2016-05-19 LAB — AEROBIC/ANAEROBIC CULTURE W GRAM STAIN (SURGICAL/DEEP WOUND): Culture: NO GROWTH

## 2016-05-19 MED ORDER — ASPIRIN EC 325 MG PO TBEC
325.0000 mg | DELAYED_RELEASE_TABLET | Freq: Every day | ORAL | Status: DC
  Administered 2016-05-19 – 2016-05-24 (×6): 325 mg via ORAL
  Filled 2016-05-19 (×7): qty 1

## 2016-05-19 MED ORDER — POLYETHYLENE GLYCOL 3350 17 G PO PACK
17.0000 g | PACK | Freq: Two times a day (BID) | ORAL | Status: DC
  Administered 2016-05-19: 17 g via ORAL
  Filled 2016-05-19 (×18): qty 1

## 2016-05-19 NOTE — Progress Notes (Signed)
Occupational Therapy Treatment Patient Details Name: NIKHITA MENTZEL MRN: [id number] DOB: 11-03-1981 Today's Date: 05/19/2016    History of present illness LEE KALT is a 35 y.o. female admitted to [facility] on 02/09/16 s/p attempted suicide: taped DNR note to chest, drank Drano, then jumped off 5 story building ([address]).S/p 02/09/16 gel foam embolization of bil internal iliac arteries.S/p 02/09/16 ex lap with cholecystectomy, resection SB and mesenteric repair, liver lac repair, application or wound vac.S/p 02/09/16 closed reduction of pelvic ring, external pelvic fixation.Also has right metacarpal fracture in splint. Pt on and off intubation via trach/ and on/off trac collar.  Pelvic x- fixator pins removed at bedside on 03/23/16.  Underwent thoracotomy 6/19 due to empyema/abcess with 2 chest tubes, one d/c 04/30/16, second d/c 05/05/16 and pt no longer has trach.  Pt with no significant PMHx listed in the chart. Pt had acute speech and left sided weakness changes on7/3/17 and both CT and MRI confirmed stroke (R frontoparietal).  Thoracotomy and evac of hematoma 05/14/16.     OT comments  Flat affect initially, but brightens as pt participates. Pt declined to get to EOB this am due to chest pain and feeling nauseated when sitting. Educated pt on importance of getting OOB in order to progress rehab. Session completed at bed level with emphasis of pt taking ownership for her role in rehab. Continues to demonstrate increased functional use of LUE. Did not observe active movement LLE. Pt will need to resume using PRAFO on LLE. (+ clonus LLE). Pt did not complete task she was given for today due to "being out of it on pain meds". Discussed with sister at end of session who states she will continue to encourage [name] to participate. Pt may benefit from use of TED hose when OOB. Encourage nsg to get pt OOB daily with use of Maximove at this time. Will continue to follow @ consistent time to progress pt toward  meeting goals.   Follow Up Recommendations  CIR;Supervision/Assistance - 24 hour    Equipment Recommendations  3 in 1 bedside comode    Recommendations for Other Services Rehab consult    Precautions / Restrictions Precautions Precautions: Fall Precaution Comments: left hemiparesis, chest tube Restrictions RUE Weight Bearing: Weight bearing as tolerated LUE Weight Bearing: Weight bearing as tolerated RLE Weight Bearing: Weight bearing as tolerated LLE Weight Bearing: Weight bearing as tolerated       Mobility Bed Mobility      mod A with rolling. Pt using bed rails            Transfers                 General transfer comment: Pt declined today    Balance                                   ADL Overall ADL's : Needs assistance/impaired                                       General ADL Comments: Pt stated she was finishing breakfast and could not get to EOB for multiple reasons, including feeling nauseated when sitting EOB. Educated pt on need to move more in order to reduce feelings of nausea. Discussed progress pt is making with her ADL tasks and she staed she could  wash her upper body and groin area but not her legs due to pain. Discussed need for pt to use long handled sponge to wash legs. Nursing gave OK to bring sponge and pt agreed to use sponge to assist with bathing. Discussong about pt's goals and imiprotance of participating with therapy in order to accomplish her goals. Pp verbalized understanding.       Vision                 Additional Comments: wears glasses. need to further assess    Perception  Need to further assess perception given recent CVA   Praxis      Cognition   Behavior During Therapy: Flat affect Overall Cognitive Status: Impaired/Different from baseline Area of Impairment: Memory;Attention;Awareness;Problem solving   Current Attention Level: Selective Memory: Decreased short-term  memory  Following Commands: Follows multi-step commands with increased time Safety/Judgement: Decreased awareness of safety;Decreased awareness of deficits Awareness: Emergent Problem Solving: Slow processing;Difficulty sequencing General Comments: Demonstrates improvement with the ability to sequence tasks and process more complex commands, although becomes easily frustrated and appears self limiting at times    Extremity/Trunk Assessment               Exercises Other Exercises Other Exercises: fine motor/coordination activities with emphasis on in hand manipulation skills Other Exercises: grip/pinch strengthening exercises/activites in addition to tendon gliding ex for intrinsic strengthening Other Exercises: PNF diagonals supine LUE Other Exercises: scapualr strengthening ex Other Exercises: core strengthening in supine with reaching with BUE while crossing midline   Shoulder Instructions       General Comments      Pertinent Vitals/ Pain       Pain Assessment: Faces Faces Pain Scale: Hurts little more Pain Location: R chest tube Pain Descriptors / Indicators: Burning;Grimacing Pain Intervention(s): Limited activity within patient's tolerance;Repositioned  Home Living                                          Prior Functioning/Environment              Frequency Min 3X/week     Progress Toward Goals  OT Goals(current goals can now be found in the care plan section)  Progress towards OT goals: Progressing toward goals  Acute Rehab OT Goals Patient Stated Goal: to get out of the hopsital OT Goal Formulation: With patient Time For Goal Achievement: 05/25/16 Potential to Achieve Goals: Good ADL Goals Pt Will Perform Lower Body Bathing: with mod assist;sitting/lateral leans Pt Will Transfer to Toilet: with +2 assist;with min assist;stand pivot transfer Pt Will Perform Toileting - Clothing Manipulation and hygiene: with min assist;sit to/from  stand Pt/caregiver will Perform Home Exercise Program: Increased strength;Left upper extremity;With minimal assist Additional ADL Goal #1: Pt will complete meaningful activity with minimal encouragement Additional ADL Goal #2: Pt will sit unsupported for 5 minutes at min guard (A) level as precursor to adls. Additional ADL Goal #3: Pt will demonstrate sit <>Stand with total +2 Mod (A)  Additional ADL Goal #4: Complete bed mobility with S for ADL  Plan Discharge plan remains appropriate    Co-evaluation                 End of Session     Activity Tolerance Patient tolerated treatment well (at bed level)   Patient Left in bed;with call bell/phone within reach;with nursing/sitter in room  Nurse Communication Mobility status;Other (comment) (need to encourage pt to participate in ADL and increase acti)        Time: 7829-56210848-0925 OT Time Calculation (min): 37 min  Charges: OT General Charges $OT Visit: 1 Procedure OT Treatments $Self Care/Home Management : 8-22 mins $Neuromuscular Re-education: 23-37 mins  Saim Almanza,HILLARY 05/19/2016, 9:54 AM   Luisa DagoHilary Reanne Nellums, OTR/L  [phone] 05/19/2016

## 2016-05-19 NOTE — Progress Notes (Signed)
      [address]       [place] [id number]             [phone]      5 Days Post-Op Procedure(s) (LRB): RIGHT VIDEO ASSISTED THORACOSCOPY (VATS),DRAINAGE OF EMPYEMA (Right) TRANSESOPHAGEAL ECHOCARDIOGRAM (TEE) (N/A) EVACUATION HEMATOMA (Right)   Subjective:  Patient has pain in her left hand.  Otherwise no new complaints.    Objective: Vital signs in last 24 hours: Temp:  [98.2 F (36.8 C)-98.5 F (36.9 C)] 98.5 F (36.9 C) (07/12 0406) Pulse Rate:  [77-98] 77 (07/12 0406) Cardiac Rhythm:  [-] Normal sinus rhythm (07/12 0705) Resp:  [16-18] 16 (07/12 0406) BP: (121-128)/(72-80) 121/76 mmHg (07/12 0406) SpO2:  [100 %] 100 % (07/12 0406) Weight:  [181 lb 7 oz (82.3 kg)] 181 lb 7 oz (82.3 kg) (07/12 0406)  Intake/Output from previous day: 07/11 0701 - 07/12 0700 In: 395 [P.O.:360; IV Piggyback:35] Out: 30 [Chest Tube:30]  General appearance: alert, cooperative and no distress Heart: regular rate and rhythm Lungs: diminished breath sounds on right  Wound: clean and dry  Lab Results:  Recent Labs  05/17/16 0415 05/19/16 0355  WBC 6.7 8.0  HGB 7.8* 8.5*  HCT 24.4* 26.9*  PLT 357 387   BMET:  Recent Labs  05/17/16 0415  NA 135  K 4.2  CL 104  CO2 24  GLUCOSE 92  BUN 10  CREATININE 1.15*  CALCIUM 9.7    PT/INR: No results for input(s): LABPROT, INR in the last 72 hours. ABG    Component Value Date/Time   PHART 7.375 05/15/2016 0345   HCO3 24.3* 05/15/2016 0345   TCO2 25.6 05/15/2016 0345   ACIDBASEDEF 0.3 05/15/2016 0345   O2SAT 97.3 05/15/2016 0345   CBG (last 3)   Recent Labs  05/17/16 2149 05/18/16 0608 05/18/16 2205  GLUCAP 120* 104* 117*    Assessment/Plan: S/P Procedure(s) (LRB): RIGHT VIDEO ASSISTED THORACOSCOPY (VATS),DRAINAGE OF EMPYEMA (Right) TRANSESOPHAGEAL ECHOCARDIOGRAM (TEE) (N/A) EVACUATION HEMATOMA (Right)  1. Chest tube- 30 cc output , continue chest tube  2. ID- continue packing at chest tube  site.. Wound is clean 3. Care per trauma  LOS: 100 days    [name], Tasnia Spegal 05/19/2016

## 2016-05-19 NOTE — Progress Notes (Signed)
Patient ID: [name] Bell, female   DOB: April 22, 1981, 35 y.o.   MRN: [id number]   LOS: 100 days   Subjective: C/o left hand pain, neuro had told her it's normal as sensation returns   Objective: Vital signs in last 24 hours: Temp:  [98.2 F (36.8 C)-98.5 F (36.9 C)] 98.5 F (36.9 C) (07/12 0406) Pulse Rate:  [77-98] 77 (07/12 0406) Resp:  [16-18] 16 (07/12 0406) BP: (121-128)/(72-80) 121/76 mmHg (07/12 0406) SpO2:  [100 %] 100 % (07/12 0406) Weight:  [82.3 kg (181 lb 7 oz)] 82.3 kg (181 lb 7 oz) (07/12 0406) Last BM Date: 05/11/16   Laboratory  CBC  Recent Labs  05/17/16 0415 05/19/16 0355  WBC 6.7 8.0  HGB 7.8* 8.5*  HCT 24.4* 26.9*  PLT 357 387    Physical Exam General appearance: alert and no distress Resp: clear to auscultation bilaterally Cardio: regular rate and rhythm GI: normal findings: bowel sounds normal and soft   Assessment/Plan: Jump from hotel/multiple toxic ingestions- Continue on Zoloft, sitter for safety.  S/P ex lap, hepatorraphy, SBR, cholecystectomy, preperitoneal pelvic packing [name] 4/3  Ex lap for acidosis/bleeding 4/4 Thompson Ex lap for bleeding 4/5 [name] S/P ex fix pelvis [name] 4/3 S/P ex lap 4/7 Thompson S/P ex lap 4/10 Thompson S/P sacral screws 4/10 [name] S/P application ABRA, open gastrostomy tube 4/12 [name]  S/P abd closure, trach 4/24 [name] Mult B rib FX/R HPTX, B pulm contusions  Multiple toxic ingestions Mult L wrist FXs - ok to remove splint per Dr. Melvyn Novasrtmann  Pelvic FX - S/P ex fix and angioembolization, S/P sacral screw by Dr. Carola FrostHandy. WBAT BLE. ABL anemia - Improved Right empyema s/p thoracotomy with recurrence s/p right thoracotomy, drainage--CT per CT surgery. Hold blood thinner per CT surgery. On Septra until 7/14. 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 Appreciate neurology F/U. Will start ASA today. For MRI brain today. FEN - Increase bowel regimen VTE - SCD's, ASA per  neuro Dispo - [facility]ual [facility]    Freeman CaldronMichael J. Codi Folkerts, PA-C Pager: [phone] General Trauma PA Pager: [phone]  05/19/2016

## 2016-05-19 NOTE — Progress Notes (Signed)
Physical Therapy Treatment Patient Details  Name: [name] Bell MRN: [id number] DOB: 11/02/1981 Today's Date: 05/19/2016    History of Present Illness [name] Bell is a 35 y.o. female admitted to [facility] on 02/09/16 s/p attempted suicide: taped DNR note to chest, drank Drano, then jumped off 5 story building ([address]).S/p 02/09/16 gel foam embolization of bil internal iliac arteries.S/p 02/09/16 ex lap with cholecystectomy, resection SB and mesenteric repair, liver lac repair, application or wound vac.S/p 02/09/16 closed reduction of pelvic ring, external pelvic fixation.Also has right metacarpal fracture in splint. Pt on and off intubation via trach/ and on/off trac collar.  Pelvic x- fixator pins removed at bedside on 03/23/16.  Underwent thoracotomy 6/19 due to empyema/abcess with 2 chest tubes, one d/c 04/30/16, second d/c 05/05/16 and pt no longer has trach.  Pt with no significant PMHx listed in the chart. Pt had acute speech and left sided weakness changes on7/3/17 and both CT and MRI confirmed stroke (R frontoparietal).  Thoracotomy and evac of hematoma 05/14/16.      PT Comments    Patient was willing to sit up in bed today but did not feel up to sitting at EOB. She sat up for 8 min in bed with therapist supporting trunk. Reviewed HEP with patient. She still cannot move LLE.   Follow Up Recommendations  CIR     Equipment Recommendations  Wheelchair (measurements PT);Wheelchair cushion (measurements PT);Hospital bed;Other (comment);3in1 (PT);Rolling walker with 5" wheels    Recommendations for Other Services Rehab consult     Precautions / Restrictions Precautions Precautions: Fall Precaution Comments: left hemiparesis, chest tube Required Braces or Orthoses: Other Brace/Splint Other Brace/Splint: L PRAFO (to prevent wound and contracture) Restrictions Weight Bearing Restrictions: Yes RUE Weight Bearing: Weight bearing as tolerated LUE Weight Bearing: Weight bearing as  tolerated RLE Weight Bearing: Weight bearing as tolerated LLE Weight Bearing: Weight bearing as tolerated    Mobility  Bed Mobility Overal bed mobility: Needs Assistance Bed Mobility: Rolling;Supine to Sit (with HOB elevated) Rolling: Min assist   Supine to sit: Mod assist;+2 for physical assistance (Could be done with 1; HOB elevated)     General bed mobility comments: VC and hand held assist to rise from supine to sit with HOB elevated. Support needed for trunk.       Balance Overall balance assessment: Needs assistance Sitting-balance support: Single extremity supported;Feet supported (Therapist supporting trunk) Sitting balance-[name]: Poor Sitting balance - Comments: Patient tolerated long sitting in bed with trunk supported by therapist for 8 min. VC to keep head upright.                             Cognition Arousal/Alertness: Awake/alert Behavior During Therapy: WFL for tasks assessed/performed Overall Cognitive Status: Impaired/Different from baseline     Current Attention Level: Selective   Following Commands: Follows one step commands consistently     Problem Solving: Slow processing;Requires verbal cues;Requires tactile cues      Exercises Total Joint Exercises Ankle Circles/Pumps: AROM;PROM;Both;10 reps;Supine Heel Slides: AROM;PROM;Both;10 reps;Supine General Exercises - Lower Extremity Short Arc Quad: AROM;10 reps;PROM;Both;Supine Hip ABduction/ADduction: AROM;Both;10 reps;PROM;Supine        Pertinent Vitals/Pain Pain Assessment: 0-10 Pain Score: 5  Faces Pain Scale: Hurts little more Pain Location: Chest tube and abdominal injection site Pain Descriptors / Indicators: Sharp;Discomfort;Pressure Pain Intervention(s): Limited activity within patient's tolerance;Monitored during session;Repositioned           PT  Goals (current goals can now be found in the care plan section) Acute Rehab PT Goals Patient Stated Goal: To do ROM  exercises and sit up in bed Progress towards PT goals: Progressing toward goals    Frequency  Min 5X/week    PT Plan Current plan remains appropriate       End of Session   Activity Tolerance: Patient tolerated treatment well;Patient limited by pain;Patient limited by fatigue Patient left: in bed;with call bell/phone within reach;with nursing/sitter in room     Time: 4540-98111303-1338 PT Time Calculation (min) (ACUTE ONLY): 35 min  Charges:                       G Codes:      Kallie LocksHannah Cheryl Stabenow 05/19/2016, 2:51 PM

## 2016-05-19 NOTE — Clinical Social Work Psych Note (Signed)
Psych CSW continuing to follow.  Once chest tubes have been removed, [facility].    Dispo: [facility]  Vickii PennaGina Aashka Salomone, LCSW [phone]  [address] [facility] Psych[facility] Service Line Licensed Clinical Social Worker

## 2016-05-19 NOTE — Progress Notes (Signed)
Speech Language Pathology Treatment: Cognitive-Linquistic  Patient Details Name: [name] Bell MRN: [id number] DOB: 04-Aug-1981 Today's Date: 05/19/2016 Time: 6742-5525 SLP Time Calculation (min) (ACUTE ONLY): 36 min  Assessment / Plan / Recommendation Clinical Impression  Treatment focused on differential diagnosis with completion of MoCA to assess cognitive-linguistic skills. Bradyphasia overall improved, with fluent conversational speech noted. She scored 27/30 on the MoCA, indicative of cognitive function WFL. Mild difficulties with selective and alternating attention noted, but pt self-monitored and corrected with Mod I. Per patient, this is baseline for her as she has always had difficulty with distractibility PTA. She verbalizes no additional concerns at this time. SLP to sign off.   HPI HPI: 35 y.o. female admitted to [facility] on 02/09/16 s/p attempted suicide: taped DNR note to chest, drank Drano, then jumped off 5 story building ([address]).S/p 02/09/16 gel foam embolization of bil internal iliac arteries.S/p 02/09/16 ex lap with cholecystectomy, resection SB and mesenteric repair, liver lac repair, application or wound vac.S/p 02/09/16 closed reduction of pelvic ring, external pelvic fixation.Also has right metacarpal fracture in splint. Pt on and off intubation via trach/ and on/off trac collar. Pelvic x- fixator pins removed at bedside on 03/23/16. Underwent thoracotomy 6/19 due to empyema/abcess with 2 chest tubes, one d/c 04/30/16, second d/c 05/05/16 and pt no longer has trach. Pt with no significant PMHx listed in the chart. Pt had acute speech and left sided weakness changes on7/3/17 and both CT and MRI confirmed stroke (R frontoparietal).       SLP Plan  All goals met     Recommendations                Follow up Recommendations: None Plan: All goals met     GO               Germain Osgood, M.A. CCC-SLP [phone]  Germain Osgood 05/19/2016, 12:25  PM

## 2016-05-20 ENCOUNTER — Inpatient Hospital Stay (HOSPITAL_COMMUNITY): Payer: Medicaid Other

## 2016-05-20 LAB — GLUCOSE, CAPILLARY: Glucose-Capillary: 118 mg/dL — ABNORMAL HIGH (ref 65–99)

## 2016-05-20 MED ORDER — DIAZEPAM 5 MG/ML IJ SOLN
5.0000 mg | Freq: Once | INTRAMUSCULAR | Status: AC | PRN
  Administered 2016-05-20: 5 mg via INTRAVENOUS
  Filled 2016-05-20: qty 2

## 2016-05-20 MED ORDER — GADOBENATE DIMEGLUMINE 529 MG/ML IV SOLN
18.0000 mL | Freq: Once | INTRAVENOUS | Status: AC | PRN
  Administered 2016-05-20: 18 mL via INTRAVENOUS

## 2016-05-20 NOTE — Progress Notes (Signed)
Trauma Service Note  Subjective: Patient is doing well.   No new complaints.  Was to get MRI yesterday, but the scanners were busy.  Should get MRI with contrast today.  Objective: Vital signs in last 24 hours: Temp:  [98.1 F (36.7 C)-98.2 F (36.8 C)] 98.1 F (36.7 C) (07/13 0410) Pulse Rate:  [74-80] 76 (07/13 0410) Resp:  [17-18] 17 (07/13 0410) BP: (111-121)/(62-74) 116/72 mmHg (07/13 0410) SpO2:  [100 %] 100 % (07/13 0410) Weight:  [81.9 kg (180 lb 8.9 oz)] 81.9 kg (180 lb 8.9 oz) (07/13 0639) Last BM Date: 05/11/16  Intake/Output from previous day: 07/12 0701 - 07/13 0700 In: 970 [P.O.:970] Out: -  Intake/Output this shift:    General: No acute distress.    Lungs: Clear.  Still draining fro single right chest tube.  No apparent air leak.  Abd: Benign  Extremities: Still not moving RLE, but gaining strength in the RUE  Neuro: RLE weakness persistent,   Lab Results: CBC   Recent Labs  05/19/16 0355  WBC 8.0  HGB 8.5*  HCT 26.9*  PLT 387   BMET No results for input(s): NA, K, CL, CO2, GLUCOSE, BUN, CREATININE, CALCIUM in the last 72 hours. PT/INR No results for input(s): LABPROT, INR in the last 72 hours. ABG No results for input(s): PHART, HCO3 in the last 72 hours.  Invalid input(s): PCO2, PO2  Studies/Results: No results found.  Anti-infectives: Anti-infectives    Start     Dose/Rate Route Frequency Ordered Stop   05/15/16 1430  cefUROXime (ZINACEF) 1.5 g in dextrose 5 % 50 mL IVPB     1.5 g 100 mL/hr over 30 Minutes Intravenous Every 12 hours 05/15/16 1042 05/17/16 0255   05/14/16 1400  cefUROXime (ZINACEF) 1.5 g in dextrose 5 % 50 mL IVPB     1.5 g 100 mL/hr over 30 Minutes Intravenous Every 12 hours 05/14/16 1255 05/15/16 0300   05/10/16 1230  sulfamethoxazole-trimethoprim (BACTRIM DS,SEPTRA DS) 800-160 MG per tablet 1 tablet     1 tablet Oral Every 12 hours 05/10/16 1122 05/21/16 0959   05/07/16 1300  ciprofloxacin (CIPRO) tablet 500 mg   Status:  Discontinued     500 mg Oral 2 times daily 05/07/16 1200 05/10/16 1122   05/06/16 1700  ceFEPIme (MAXIPIME) 2 g in dextrose 5 % 50 mL IVPB  Status:  Discontinued     2 g 100 mL/hr over 30 Minutes Intravenous Every 8 hours 05/06/16 1316 05/07/16 1200   04/27/16 1000  ceFEPIme (MAXIPIME) 2 g in dextrose 5 % 50 mL IVPB  Status:  Discontinued     2 g 100 mL/hr over 30 Minutes Intravenous Every 12 hours 04/27/16 0818 05/06/16 1316   04/22/16 1200  piperacillin-tazobactam (ZOSYN) IVPB 3.375 g  Status:  Discontinued     3.375 g 12.5 mL/hr over 240 Minutes Intravenous Every 6 hours 04/22/16 1039 04/22/16 1042   04/22/16 1200  vancomycin (VANCOCIN) 1,500 mg in sodium chloride 0.9 % 500 mL IVPB  Status:  Discontinued     1,500 mg 250 mL/hr over 120 Minutes Intravenous Every 12 hours 04/22/16 1133 04/28/16 0115   04/22/16 1130  piperacillin-tazobactam (ZOSYN) IVPB 3.375 g  Status:  Discontinued     3.375 g 12.5 mL/hr over 240 Minutes Intravenous Every 8 hours 04/22/16 1043 04/27/16 0754   04/02/16 1200  fluconazole (DIFLUCAN) 40 MG/ML suspension 400 mg     400 mg Per Tube  Once 04/02/16 1026 04/02/16 1442  03/31/16 1000  sulfamethoxazole-trimethoprim (BACTRIM,SEPTRA) 200-40 MG/5ML suspension 40 mL  Status:  Discontinued     40 mL Per Tube Every 12 hours 03/31/16 0927 03/31/16 0956   03/31/16 1000  sulfamethoxazole-trimethoprim (BACTRIM,SEPTRA) 200-40 MG/5ML suspension 20 mL     20 mL Per Tube Every 6 hours 03/31/16 0956 04/03/16 2359   03/26/16 1030  cefTRIAXone (ROCEPHIN) 2 g in dextrose 5 % 50 mL IVPB  Status:  Discontinued     2 g 100 mL/hr over 30 Minutes Intravenous Every 24 hours 03/26/16 1023 03/31/16 0921   03/25/16 1000  ceFEPIme (MAXIPIME) 2 g in dextrose 5 % 50 mL IVPB  Status:  Discontinued     2 g 100 mL/hr over 30 Minutes Intravenous Every 12 hours 03/25/16 0829 03/26/16 1023   03/08/16 1400  levofloxacin (LEVAQUIN) IVPB 750 mg     750 mg 100 mL/hr over 90 Minutes  Intravenous Every 24 hours 03/08/16 0912 03/10/16 1501   03/06/16 1400  ceFEPIme (MAXIPIME) 2 g in dextrose 5 % 50 mL IVPB  Status:  Discontinued     2 g 100 mL/hr over 30 Minutes Intravenous Every 12 hours 03/06/16 1344 03/08/16 0912   03/01/16 1330  ceFAZolin (ANCEF) IVPB 2g/100 mL premix     2 g 200 mL/hr over 30 Minutes Intravenous To [facility] 02/29/16 0917 03/01/16 1452   02/11/16 0600  piperacillin-tazobactam (ZOSYN) IVPB 3.375 g  Status:  Discontinued     3.375 g 100 mL/hr over 30 Minutes Intravenous 4 times per day 02/10/16 2214 02/22/16 0853   02/10/16 1730  piperacillin-tazobactam (ZOSYN) IVPB 3.375 g  Status:  Discontinued     3.375 g 12.5 mL/hr over 240 Minutes Intravenous 3 times per day 02/10/16 1727 02/10/16 2214      Assessment/Plan: s/p Procedure(s): RIGHT VIDEO ASSISTED THORACOSCOPY (VATS),DRAINAGE OF EMPYEMA TRANSESOPHAGEAL ECHOCARDIOGRAM (TEE) EVACUATION HEMATOMA MRI today.  No other changes  LOS: 101 days   Marta Lamas. Gae Bon, MD, FACS [phone] Trauma Surgeon 05/20/2016

## 2016-05-20 NOTE — Progress Notes (Signed)
      [address]       [place] [id number]             [phone]      6 Days Post-Op Procedure(s) (LRB): RIGHT VIDEO ASSISTED THORACOSCOPY (VATS),DRAINAGE OF EMPYEMA (Right) TRANSESOPHAGEAL ECHOCARDIOGRAM (TEE) (N/A) EVACUATION HEMATOMA (Right)   Subjective:  [name] Bell has no new complaints.  Continues to be unable to move left leg, but is able to feel people touching her.  Objective: Vital signs in last 24 hours: Temp:  [98.1 F (36.7 C)-98.2 F (36.8 C)] 98.1 F (36.7 C) (07/13 0410) Pulse Rate:  [74-80] 76 (07/13 0410) Cardiac Rhythm:  [-] Normal sinus rhythm (07/13 0710) Resp:  [17-18] 17 (07/13 0410) BP: (111-121)/(62-74) 116/72 mmHg (07/13 0410) SpO2:  [100 %] 100 % (07/13 0410) Weight:  [180 lb 8.9 oz (81.9 kg)] 180 lb 8.9 oz (81.9 kg) (07/13 0639)  Intake/Output from previous day: 07/12 0701 - 07/13 0700 In: 970 [P.O.:970] Out: -   General appearance: alert, cooperative and no distress Heart: regular rate and rhythm Lungs: diminished on right Abdomen: soft, non-tender; bowel sounds normal; no masses,  no organomegaly Wound: clean, packing in chest tube wound site, incision clean, staples in place  Lab Results:  Recent Labs  05/19/16 0355  WBC 8.0  HGB 8.5*  HCT 26.9*  PLT 387   BMET: No results for input(s): NA, K, CL, CO2, GLUCOSE, BUN, CREATININE, CALCIUM in the last 72 hours.  PT/INR: No results for input(s): LABPROT, INR in the last 72 hours. ABG    Component Value Date/Time   PHART 7.375 05/15/2016 0345   HCO3 24.3* 05/15/2016 0345   TCO2 25.6 05/15/2016 0345   ACIDBASEDEF 0.3 05/15/2016 0345   O2SAT 97.3 05/15/2016 0345   CBG (last 3)   Recent Labs  05/18/16 0608 05/18/16 2205 05/19/16 1643  GLUCAP 104* 117* 117*    Assessment/Plan: S/P Procedure(s) (LRB): RIGHT VIDEO ASSISTED THORACOSCOPY (VATS),DRAINAGE OF EMPYEMA (Right) TRANSESOPHAGEAL ECHOCARDIOGRAM (TEE) (N/A) EVACUATION HEMATOMA (Right)  1. Chest  tube- no drainage recorded, leave in place 2. ID- continue packing chest tube site 3. Neuro- for MRI today, able to feel sensation with left leg 4. Care per trauma   LOS: 101 days    [name] Bell, [name] Bell 05/20/2016

## 2016-05-20 NOTE — Progress Notes (Signed)
STROKE TEAM PROGRESS NOTE   SUBJECTIVE (INTERVAL HISTORY) Patient is neurologically stable. She has no complaints. MRI brain is pending   OBJECTIVE Temp:  [98.1 F (36.7 C)-98.2 F (36.8 C)] 98.2 F (36.8 C) (07/13 1300) Pulse Rate:  [76-82] 82 (07/13 1300) Cardiac Rhythm:  [-] Normal sinus rhythm (07/13 1022) Resp:  [17-18] 18 (07/13 1300) BP: (116-122)/(72-74) 122/73 mmHg (07/13 1300) SpO2:  [100 %] 100 % (07/13 1300) Weight:  [180 lb (81.647 kg)-180 lb 8.9 oz (81.9 kg)] 180 lb (81.647 kg) (07/13 1201)  CBC:   Recent Labs Lab 05/17/16 0415 05/19/16 0355  WBC 6.7 8.0  NEUTROABS  --  4.9  HGB 7.8* 8.5*  HCT 24.4* 26.9*  MCV 85.6 86.2  PLT 357 387    Basic Metabolic Panel:   Recent Labs Lab 05/16/16 0340 05/17/16 0415  NA 137 135  K 4.2 4.2  CL 107 104  CO2 25 24  GLUCOSE 82 92  BUN 11 10  CREATININE 1.27* 1.15*  CALCIUM 9.2 9.7    Lipid Panel:     Component Value Date/Time   CHOL 180 05/11/2016 0518   TRIG 108 05/11/2016 0518   HDL 46 05/11/2016 0518   CHOLHDL 3.9 05/11/2016 0518   VLDL 22 05/11/2016 0518   LDLCALC 112* 05/11/2016 0518   HgbA1c:  Lab Results  Component Value Date   HGBA1C 5.4 05/11/2016     IMAGING  Dg Chest 2 View 05/10/2016  Enlarging loculated right pleural fluid collection, possibly reaccumulation of an empyema. Probable airspace opacification in the right lung. Electronically Signed   By: [name] Bell M.D.   On: 05/10/2016 16:00   Ct Head Wo Contrast 05/14/2016 1. Continued interval evolution of acute right MCA territory infarct with associated small volume petechial hemorrhage. Similar localized edema without significant mass effect or midline shift. 2. No new intracranial process.  05/13/2016 Evolution of the right frontal lobe infarct with a new area of hemorrhage within this infarct. No hemorrhage seen elsewhere. No new infarct seen elsewhere. No midline shift. No subdural or epidural fluid collections.  05/10/2016   SUBTLE AREA OF LOW DENSITY AND LOSS OF GRAY-WHITE DIFFERENTIATION IN THE ANTERIOR RIGHT FRONTAL LOBE COMPATIBLE WITH ACUTE INFARCTION.  02/10/2016 No acute intracranial abnormality or acute abnormality of the cervical spine. Sinus disease is likely related to intubation and OG tube placement. The patient's OG tube is looped in the pharynx and mouth.  Mr Brain Wo Contrast 05/10/2016  Subacute to chronic appearing RIGHT frontoparietal lobe infarct in RIGHT MCA distribution with petechial hemorrhage. Findings less likely represent venous infarct. 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   Ct Angio head & Neck W Or Wo Contrast 05/11/2016   Progression of cytotoxic edema compared with the initial CT from 05/10/2016 in the RIGHT frontal lobe. Moderate irregularity of the A3 and A4 segments, anterior cerebral artery on the RIGHT, correlates with the observed pattern of restricted diffusion on MR; considerations would include vasculitis, intracranial atherosclerosis, or vasospasm. No intracranial large vessel occlusion or extracranial stenosis of significance. No findings to support cortical venous or dural venous thrombosis. Suspected recurrent empyema in the RIGHT chest, 9 x 10 cm cross-section with air-fluid level. Recommend CT chest with contrast for further evaluation.   2D Echocardiogram  02/25/2016 - Left ventricle: The cavity size was normal. Systolic function was normal. The estimated ejection fraction was in the range of 60% to 65%. Wall motion was normal; there  were no regional wall motion abnormalities. Left ventricular diastolic function parameters were normal. - Aortic valve: Trileaflet; mildly thickened leaflets. Valve area (VTI): 1.91 cm^2. Valve area (Vmax): 2.11 cm^2. Valve area (Vmean): 2.14 cm^2. - Right atrium: Mobile linear density seen in the right atrium.  This most likely represents a catheter. There is a bright  target off of this that is highly mobile and concerning for possible thrombus or vegetation. This is not seen in all views. Recommend TEE for further evaluation is clinically indicated. - Pulmonary arteries: PA peak pressure: 39 mm Hg (S). Impressions:   The right ventricular systolic pressure was increased consistent with mild pulmonary hypertension.  TCD bubble study - negative for PFO  LE venous doppler - negative for DVT   Ct Head Wo Contrast 05/13/2016  IMPRESSION: Evolution of the right frontal lobe infarct with a new area of hemorrhage within this infarct. No hemorrhage seen elsewhere. No new infarct seen elsewhere. No midline shift. No subdural or epidural fluid collections.   EEG This is an abnormal EEG due to focal right hemispheric slowing. This is consistent with known stroke in that region. No seizures or epileptiform discharges are seen on this study.   TEE 05/14/16 all valve look fine with no vegetation, no LV or LA or LAA thrombus, normal LV function. Ascending aorta looks normal  MRI Brain w/wo 05/20/16 shows resolution of the previously seen diffusion abnormality with enhancement in the right frontal lesion suggestive of Late subacute to early chronic hemorrhagic lesion of unclear etiology. Delayed hemorrhagic contusion also possible but timeframe little too long infarct  PHYSICAL EXAM General - Well nourished, well developed, in no apparent distress.  Ophthalmologic - Fundi not visualized due to noncooperation.  Cardiovascular - Regular rate and rhythm.  Mental Status -  Level of arousal and orientation to time, place, and person were intact. Language including expression, naming, repetition, comprehension was assessed and found intact,  Fund of Knowledge was assessed and was intact.  Cranial Nerves II - XII - II - Visual field intact OU. III, IV, VI - Extraocular movements intact. V - Facial sensation intact bilaterally. VII - Facial movement intact bilaterally. VIII -  Hearing & vestibular intact bilaterally. X - Palate elevates symmetrically. XI - Chin turning & shoulder shrug intact bilaterally XII - Tongue protrusion intact.  Motor Strength - The patient's strength was 5/5 RUE and RLE, but 3/5 LUE and 0/5 LLE. Able to move left fingers today  Bulk was normal and fasciculations were absent.   Motor Tone - Muscle tone was assessed at the neck and appendages and was normal.  Reflexes - The patient's reflexes were 1+ in all extremities and she had left positive babinski.  Sensory - Light touch, temperature/pinprick were assessed and were decreased on LLE    Coordination - The patient had normal movements in the right hand with no ataxia or dysmetria.  Tremor was absent.  Gait and Station - not tested due to weakness.   ASSESSMENT/PLAN [name] is a 35 y.o. female admitted 3 mos ago after drinking drano and jumping from a 5th floor window who developed aphasia in the hospital 05/10/2016 with decreased movements L side. She did not receive IV t-PA due to unknown LKW, recent trauma.    Cryptogenic stroke with hemorrhagic transformation - R ACA and MCA/ACA subcortical hemorrhagic lesion of unclear age. Etiology possibly delayed hemorrhagic contusion from her fall versus cryptogenic infarct of unknown cause   Resultant  Left hemiplegia  MRI  R ACA and MCA/ACA  Subacute hemorrhagic lesion ? Infarct versus contusion. Severe diffuse axonal injury.  CTA head & neck  Irregular R ACA A3-A4 segments, no carotid dissection  Repeat CT head showed right frontal hemorrhagic transformation  Repeat CT head stable petechial hemorrhage transformation  Check CTV  LE venous Doppler negative for DVT  TCD bubble study  negative for PFO  2D Echo 02/25/16 - R atrium bright target, highly mobile, concerning for possible thrombus/vegetation  TEE - 05/14/16 - all valve look fine with no vegetation, no LV or LA or LAA thrombus, normal LV function. Ascending aorta  looks normal  LDL 112   HgbA1c 5.4  Hypercoagulable labls positive lupus anticoagulant and mildly positive HPP and cardiolipin IgM - need to repeat in 3 months or so    SCDs for VTE prophylaxis Diet regular Room service appropriate?: Yes; Fluid consistency:: Thin  No antithrombotic prior to admission, now on no antithrombotics due to hemorrhagic transformation, bloody pleural effusion and dropping Hb requiring blood transfusion. Will consider ASA once hemodynamically stable and cleared by CTS.   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  New onset seizure night of 05/12/16, most likely related to stroke with hemorrhagic transformation  Treated with ativan acutely and vimpat load  Continue Vimpat 100mg  bid  EEG right slowing, no seizure  Seizure precautions  Anemia  Hb drop 7.8->6.4->transfusion->7.7  s/p blood transfusion  Hold off ASA for now  Consider to restart ASA once anemia improves and hemodynamically stable and cleared by CTS  BP management  Stable   Due to hemorrhagic transformation, BP goal < 160  Avoid hypotension   Hyperlipidemia  Home meds:  No statin  LDL 112, goal < 70  Now on lipitor 20  Continue statin at discharge  Other Stroke Risk Factors  Overweight, Body mass index is 25.19 kg/(m^2).   Other Active Problems  Trauma s/p suicidal attempt and fall  Suicidal attempt  Depression on zoloft  R empyema s/p right thracotomy  Hospital day # 101  I have personally examined this patient, reviewed notes, independently viewed imaging studies, participated in medical decision making and plan of care. I have made any additions or clarifications directly to the above note.  The exact etiology of her right frontal hemorrhagic lesion is unclear. Her neurological exam with increased tone and a sustained clonus in the left leg argues towards the  more chronic timeframe for that lesion which would favor delayed hemorrhagic contusion from her initial fall. Patient did have a seizure 10 days ago but neurological exam and imaging characteristics of the right frontal lesion seen a lot older. Continue Keppra for seizures and aspirin for stroke prevention.MRI brain shows lesion charecteristics ofl ate subacute/early chronic right brain infarct of unclear etiology .delayed hemorrhagic contusion following her initial fall possible dental timing of enhancement is out of typical window of 2 months Follow-up as an outpatient with Dr. Roda Shutters in 2 months. Stroke team will sign off. Kindly call for questions. Greater than 50% time during this 25 minute visit was spent on counseling and coordination of care about her brain lesion, seizures, stroke and answered questions..Discussed with Dr. Lindie Spruce .Greater than 50% time during this 25 minute visit was spent on counseling and coordination of care about her stroke  Delia Heady, MD Medical Director [facility] Pager: [phone] 05/20/2016 4:07 PM Addendum : Discussed CT scan and  prior MRI films with neuroradiologist Dr. Bridgett LarssonSteve Olson. Enhancement in the right frontal lesion would be outside the typical timeframe for old hemorrhagic contusion hence recommend checking MRI scan of the brain with and without contrast to rule out cerebritis Delia HeadyPramod sethi, MD   To contact Stroke Continuity provider, please refer to [email or web address]. After hours, contact General Neurology

## 2016-05-20 NOTE — Progress Notes (Signed)
PT Cancellation Note  Patient Details Name: [name] Bell MRN: [id number] DOB: Apr 01, 1981   Cancelled Treatment:    Reason Eval/Treat Not Completed: Patient at procedure or test/unavailable. Stopped by at 11:00 to check in with patient. She was asleep. Checked in again at 11:50 and she had just left for MRI. Will check back again later as time permits.   Kallie LocksHannah Christop Hippert, VirginiaPTA #[id number]#[phone] 05/20/2016, 11:53 AM

## 2016-05-21 MED ORDER — MAGNESIUM CITRATE PO SOLN
1.0000 | Freq: Once | ORAL | Status: AC
  Administered 2016-05-21: 1 via ORAL
  Filled 2016-05-21: qty 296

## 2016-05-21 MED ORDER — OXYCODONE HCL 5 MG PO TABS
5.0000 mg | ORAL_TABLET | ORAL | Status: DC | PRN
Start: 2016-05-21 — End: 2016-06-03
  Administered 2016-05-21 – 2016-05-23 (×7): 10 mg via ORAL
  Administered 2016-05-24 – 2016-05-25 (×3): 15 mg via ORAL
  Administered 2016-05-26 (×2): 10 mg via ORAL
  Administered 2016-05-26: 5 mg via ORAL
  Administered 2016-05-27: 10 mg via ORAL
  Administered 2016-05-27 – 2016-05-29 (×4): 15 mg via ORAL
  Administered 2016-05-30 (×2): 10 mg via ORAL
  Administered 2016-05-31 – 2016-06-02 (×4): 15 mg via ORAL
  Filled 2016-05-21 (×2): qty 3
  Filled 2016-05-21: qty 1
  Filled 2016-05-21: qty 2
  Filled 2016-05-21 (×2): qty 3
  Filled 2016-05-21: qty 2
  Filled 2016-05-21: qty 1
  Filled 2016-05-21 (×2): qty 3
  Filled 2016-05-21 (×2): qty 2
  Filled 2016-05-21: qty 3
  Filled 2016-05-21 (×4): qty 2
  Filled 2016-05-21: qty 3
  Filled 2016-05-21: qty 1
  Filled 2016-05-21 (×4): qty 3
  Filled 2016-05-21 (×2): qty 2

## 2016-05-21 NOTE — Progress Notes (Signed)
Occupational Therapy Treatment Patient Details Name: [name] BobKatia D Bell MRN: [id number] DOB: Apr 26, 1981 Today's Date: 05/21/2016    History of present illness [name] Bell is a 35 y.o. female admitted [facility] on 02/09/16 s/p attempted suicide: taped DNR note to chest, drank Drano, then jumped off 5 story building ([address]).S/p 02/09/16 gel foam embolization of bil internal iliac arteries.S/p 02/09/16 ex lap with cholecystectomy, resection SB and mesenteric repair, liver lac repair, application or wound vac.S/p 02/09/16 closed reduction of pelvic ring, external pelvic fixation.Also has right metacarpal fracture in splint. Pt on and off intubation via trach/ and on/off trac collar.  Pelvic x- fixator pins removed at bedside on 03/23/16.  Underwent thoracotomy 6/19 due to empyema/abcess with 2 chest tubes, one d/c 04/30/16, second d/c 05/05/16 and pt no longer has trach.  Pt with no significant PMHx listed in the chart. Pt had acute speech and left sided weakness changes on7/3/17 and both CT and MRI confirmed stroke (R frontoparietal).  Thoracotomy and evac of hematoma 05/14/16.     OT comments  Brighter affect today. Good participation with bed level ADL. Established goals for this week, including goal of getting OOB to chair daily, increasing mobility and only asking for help during ADL tasks if needed. Continue to recommend CIR for rehab. Will continue to follow and re establish goals next week.   Follow Up Recommendations  CIR;Supervision/Assistance - 24 hour    Equipment Recommendations  3 in 1 bedside comode    Recommendations for Other Services Rehab consult    Precautions / Restrictions Precautions Precautions: Fall Precaution Comments: left hemiparesis, chest tube Required Braces or Orthoses: Other Brace/Splint Other Brace/Splint: L PRAFO Restrictions Weight Bearing Restrictions: Yes RUE Weight Bearing: Weight bearing as tolerated LUE Weight Bearing: Weight bearing as tolerated RLE  Weight Bearing: Weight bearing as tolerated LLE Weight Bearing: Weight bearing as tolerated       Mobility Bed Mobility     Rolling: Mod assist            Transfers      not attempted today. Educated pt on process of how to stand on "1 leg" with support of therapists and pt staed she was willing to try next session.                 Balance                                   ADL Overall ADL's : Needs assistance/impaired         Upper Body Bathing: Set up;Bed level   Lower Body Bathing: Moderate assistance;Bed level Lower Body Bathing Details (indicate cue type and reason): bathing at bed level with use of long handled sponge. Educated pt on use of bed positionoing/use of bed controls.to help increase her independence with bathing                       General ADL Comments: Hand over hand requried at times for funcitonal use of L hand during tasks due to weakness and incoordination and apparent L inattention. At end of session, pt established 5 goals to address this week.       Vision                 Additional Comments: wears glasses   Perception  will further assess. Able to write without letter omission. Spacing is off.  Praxis      Cognition   Behavior During Therapy: WFL for tasks assessed/performed Overall Cognitive Status: Impaired/Different from baseline Area of Impairment: Attention;Memory;Awareness;Problem solving;Safety/judgement   Current Attention Level: Selective Memory: Decreased short-term memory  Following Commands: Follows multi-step commands with increased time   Awareness: Emergent Problem Solving: Slow processing General Comments: Demonstrating increased insightinto attentional deficits by initiating turning off TV prior to session. Multiple vcto use L hand during sesison    Extremity/Trunk Assessment   L inattention. LUE generalized weakness and difficulty with coordination. Cues to use LUE  during ADL tasks Pt wo work on her arm exercises given to her in previous session.    L lateral lean noted during ADL. Mod A to reposition in bed to midline.        Exercises Other Exercises Other Exercises: PNF diagonals supine LUE   Shoulder Instructions       General Comments      Pertinent Vitals/ Pain       Pain Assessment: Faces Faces Pain Scale: Hurts little more Pain Location: back/chest tube Pain Descriptors / Indicators: Burning;Discomfort;Grimacing Pain Intervention(s): Limited activity within patient's tolerance  Home Living                                          Prior Functioning/Environment              Frequency Min 3X/week     Progress Toward Goals  OT Goals(current goals can now be found in the care plan section)  Progress towards OT goals: Progressing toward goals  Acute Rehab OT Goals Patient Stated Goal: to meet my goals OT Goal Formulation: With patient Time For Goal Achievement: 05/25/16 Potential to Achieve Goals: Good ADL Goals Pt Will Perform Lower Body Bathing: with mod assist;sitting/lateral leans Pt Will Transfer to Toilet: with +2 assist;with min assist;stand pivot transfer Pt Will Perform Toileting - Clothing Manipulation and hygiene: with min assist;sit to/from stand Pt/caregiver will Perform Home Exercise Program: Increased strength;Left upper extremity;With minimal assist Additional ADL Goal #1: Pt will complete meaningful activity with minimal encouragement Additional ADL Goal #2: Pt will sit unsupported for 5 minutes at min guard (A) level as precursor to adls. Additional ADL Goal #3: Pt will demonstrate sit <>Stand with total +2 Mod (A)  Additional ADL Goal #4: Complete bed mobility with S for ADL  Plan Discharge plan remains appropriate    Co-evaluation                 End of Session     Activity Tolerance Patient tolerated treatment well   Patient Left in bed;with call bell/phone within  reach;with nursing/sitter in room   Nurse Communication Mobility status        Time: 1010-1050 OT Time Calculation (min): 40 min  Charges: OT General Charges $OT Visit: 1 Procedure OT Treatments $Self Care/Home Management : 38-52 mins  Tiffnay Bossi,HILLARY 05/21/2016, 1:56 PM   [employer] [facility], OTR/L  [phone] 05/21/2016 Anastaisa Wooding, OTR/L  [phone] 05/21/2016

## 2016-05-21 NOTE — Progress Notes (Signed)
PT Cancellation Note  Patient Details Name: Phylliss BobKatia D Willhelm MRN: [id number] DOB: 1981-03-30   Cancelled Treatment:    Reason Eval/Treat Not Completed: Pain limiting ability to participate. Pt requested we return at 1500 for treatment. She is hurting and just received pain medication.   Kallie LocksHannah Cline Draheim, VirginiaPTA #[phone] 05/21/2016, 11:51 AM

## 2016-05-21 NOTE — Progress Notes (Signed)
Physical Therapy Treatment Patient Details  Name: [name] Bell MRN: [id number] DOB: 12-May-1981 Today's Date: 05/21/2016    History of Present Illness [name] Bell is a 35 y.o. female admitted to [facility] on 02/09/16 s/p attempted suicide: taped DNR note to chest, drank Drano, then jumped off 5 story building ([address]).S/p 02/09/16 gel foam embolization of bil internal iliac arteries.S/p 02/09/16 ex lap with cholecystectomy, resection SB and mesenteric repair, liver lac repair, application or wound vac.S/p 02/09/16 closed reduction of pelvic ring, external pelvic fixation.Also has right metacarpal fracture in splint. Pt on and off intubation via trach/ and on/off trac collar.  Pelvic x- fixator pins removed at bedside on 03/23/16.  Underwent thoracotomy 6/19 due to empyema/abcess with 2 chest tubes, one d/c 04/30/16, second d/c 05/05/16 and pt no longer has trach.  Pt with no significant PMHx listed in the chart. Pt had acute speech and left sided weakness changes on7/3/17 and both CT and MRI confirmed stroke (R frontoparietal).  Thoracotomy and evac of hematoma 05/14/16.      PT Comments    Patient requested to sit EOB today and was agreeable to stand. HEP was performed while sitting EOB. Pt required max assist + 2 at the beginning of treatment but seemed to improve in strength, needing less assistance as treatment progressed. She frequently requires VC to remember to use her L UE and is still unable to perform volentary movement with the L LE Pt would benefit from continued PT to improve her functional independence.    Follow Up Recommendations  CIR     Equipment Recommendations  Wheelchair (measurements PT);Wheelchair cushion (measurements PT);Hospital bed;Other (comment);3in1 (PT);Rolling walker with 5" wheels    Recommendations for Other Services Rehab consult     Precautions / Restrictions Precautions Precautions: Fall Precaution Comments: left hemiparesis, chest tube Required Braces  or Orthoses: Other Brace/Splint Other Brace/Splint: L PRAFO Restrictions Weight Bearing Restrictions: Yes RUE Weight Bearing: Weight bearing as tolerated LUE Weight Bearing: Weight bearing as tolerated RLE Weight Bearing: Weight bearing as tolerated LLE Weight Bearing: Weight bearing as tolerated    Mobility  Bed Mobility Overal bed mobility: Needs Assistance Bed Mobility: Rolling;Sidelying to Sit;Sit to Sidelying Rolling: Mod assist (HOB elevated) Sidelying to sit: +2 for physical assistance;Max assist;HOB elevated     Sit to sidelying: Mod assist;+2 for physical assistance General bed mobility comments: Pt was max assist at the beginning of the session, but as the session continued she progressed to mod assist with bed mobilities. Consistantly requires assistance to move her LLE  Transfers Overall transfer level: Needs assistance Equipment used: Rolling walker (2 wheeled) (chuck pad) Transfers: Sit to/from Stand Sit to Stand: Max assist;+2 physical assistance;+2 safety/equipment         General transfer comment: Two therapists requried for sit to stand with RW. Therapists braced both pt's knees during transfer and used chuck pad to facilitate hip extension.    Balance Overall balance assessment: Needs assistance Sitting-balance support: Bilateral upper extremity supported;Single extremity supported;Feet supported;Feet unsupported Sitting balance-[name]: Poor Sitting balance - Comments: Patient sat EOB for 12 min. At start pt required max/mod assist to remain upright with bil UE for support. By end of session pt was able to sit EOB with out assistance from therapist and single UE for support. Performed HEP while sitting EOB. Pt needed min assist for balance when performing LAQ, and VC to use both UE for support. Otherwise, pt was min guard when performing HEP at EOB Postural control:  Left lateral lean (improved throughout session) Standing balance support: Bilateral upper  extremity supported Standing balance-[name]: Poor Standing balance comment: Patient required max assist +2 to stand in RW. Tolerated standing for 2 min.                    Cognition Arousal/Alertness: Awake/alert Behavior During Therapy: WFL for tasks assessed/performed   Current Attention Level: Selective Memory: Decreased short-term memory Following Commands: Follows one step commands consistently;Follows multi-step commands with increased time   Awareness: Emergent Problem Solving: Slow processing     Exercises General Exercises - Lower Extremity Ankle Circles/Pumps: AROM;Right;10 reps;Seated Long Arc Quad: AROM;Right;10 reps;Seated Hip ABduction/ADduction: AROM;Right;10 reps;Seated Hip Flexion/Marching: AROM;Right;10 reps;Seated      Pertinent Vitals/Pain Pain Assessment: 0-10 Pain Score: 5  Faces Pain Scale: Hurts little more Pain Location: back/chest tube; stomach Pain Descriptors / Indicators: Discomfort;Pressure Pain Intervention(s): Limited activity within patient's tolerance;Monitored during session;Repositioned           PT Goals (current goals can now be found in the care plan section) Acute Rehab PT Goals Patient Stated Goal: Sit EOB for 60 seconds PT Goal Formulation: With patient Time For Goal Achievement: 05/25/16 Potential to Achieve Goals: Good (Given more time) Progress towards PT goals: Progressing toward goals    Frequency  Min 5X/week    PT Plan Current plan remains appropriate       End of Session   Activity Tolerance: Patient tolerated treatment well;Patient limited by pain;Patient limited by fatigue Patient left: in bed;with call bell/phone within reach;with nursing/sitter in room     Time: 1533-1610 PT Time Calculation (min) (ACUTE ONLY): 37 min  Charges:                       G Codes:      Kallie LocksHannah Jahlisa Rossitto 05/21/2016, 4:50 PM

## 2016-05-21 NOTE — Progress Notes (Signed)
Nutrition Follow-up  DOCUMENTATION CODES:   Severe malnutrition in context of acute illness/injury  INTERVENTION:    Continue Regular diet  NUTRITION DIAGNOSIS:   Increased nutrient needs related to  (trauma) as evidenced by estimated needs  Ongoing  GOAL:   Patient will meet greater than or equal to 90% of their needs  Met  MONITOR:   PO intake, Supplement acceptance, Labs, Weight trends, Skin, I & O's  ASSESSMENT:   Pt from [place] with no past medical hx admitted after suicide attempt by ingestion of Drano, 2 bottles of acetaminophen, nyquil, and possibly 6 alprazolam pills and jumping from 5th floor balcony of hotel. 4/3 pt is s/p exp lap, hepatorraphy, SBR, cholecystectomy, preperitoneal pelvic packing, ex fix pelvis, R HPTX, Bil pulmonary contusions.  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 S/P right thoracotomy, drainage of hemothorax, and TEE 7/7; found to have right hemothorax and incisional chest wall hematoma.  Patient currently on Regular diet >> PO intake 100% per flowsheet records. Disposition is eventual CIR followed by [facility] or [place].  Diet Order:  Diet regular Room service appropriate?: Yes; Fluid consistency:: Thin  Skin:  Reviewed, no issues (Incisions)  Last BM:  7/4  Height:   Ht Readings from Last 1 Encounters:  04/26/16 5' 11"  (1.803 m)    Weight:   Wt Readings from Last 1 Encounters:  05/21/16 182 lb 8.7 oz (82.8 kg)    Ideal Body Weight:  70.4 kg  BMI:  Body mass index is 25.47 kg/(m^2).  Estimated Nutritional Needs:   Kcal:  2000-2200  Protein:  105-125 gm  Fluid:  2-2.2 L  EDUCATION NEEDS:   No education needs identified at this time  Arthur Holms, RD, LDN Pager #: [phone] After-Hours Pager #: [phone]

## 2016-05-21 NOTE — Progress Notes (Addendum)
      [address]       [place] [id number]             [phone]      7 Days Post-Op Procedure(s) (LRB): RIGHT VIDEO ASSISTED THORACOSCOPY (VATS),DRAINAGE OF EMPYEMA (Right) TRANSESOPHAGEAL ECHOCARDIOGRAM (TEE) (N/A) EVACUATION HEMATOMA (Right)   Subjective:  No new complaints.  Objective: Vital signs in last 24 hours: Temp:  [98.2 F (36.8 C)-98.6 F (37 C)] 98.2 F (36.8 C) (07/14 0518) Pulse Rate:  [76-82] 76 (07/14 0518) Cardiac Rhythm:  [-] Normal sinus rhythm (07/14 0704) Resp:  [18] 18 (07/14 0518) BP: (113-122)/(66-77) 120/77 mmHg (07/14 0518) SpO2:  [99 %-100 %] 99 % (07/14 0518) Weight:  [180 lb (81.647 kg)-182 lb 8.7 oz (82.8 kg)] 182 lb 8.7 oz (82.8 kg) (07/14 0518)  Intake/Output from previous day: 07/13 0701 - 07/14 0700 In: 240 [P.O.:240] Out: 70 [Chest Tube:70]  General appearance: alert, cooperative and no distress Heart: regular rate and rhythm Lungs: diminished breath sounds right base Abdomen: soft, non-tender; bowel sounds normal; no masses,  no organomegaly Wound: clean and dry  Lab Results:  Recent Labs  05/19/16 0355  WBC 8.0  HGB 8.5*  HCT 26.9*  PLT 387   BMET: No results for input(s): NA, K, CL, CO2, GLUCOSE, BUN, CREATININE, CALCIUM in the last 72 hours.  PT/INR: No results for input(s): LABPROT, INR in the last 72 hours. ABG    Component Value Date/Time   PHART 7.375 05/15/2016 0345   HCO3 24.3* 05/15/2016 0345   TCO2 25.6 05/15/2016 0345   ACIDBASEDEF 0.3 05/15/2016 0345   O2SAT 97.3 05/15/2016 0345   CBG (last 3)   Recent Labs  05/18/16 2205 05/19/16 1643 05/20/16 1129  GLUCAP 117* 117* 118*    Assessment/Plan: S/P Procedure(s) (LRB): RIGHT VIDEO ASSISTED THORACOSCOPY (VATS),DRAINAGE OF EMPYEMA (Right) TRANSESOPHAGEAL ECHOCARDIOGRAM (TEE) (N/A) EVACUATION HEMATOMA (Right)  1. Chest tube- 70 cc output yesterday- leave in place, will get CXR in AM 2. ID- continue wound packing, staples on  surgical incision will be ready for removal on 7/21 3. Dispo- care per trauma   LOS: 102 days    [name], ERIN 05/21/2016

## 2016-05-21 NOTE — Progress Notes (Signed)
Patient ID: LANNY DONOSO, female   DOB: [birth year], 35 y.o.   MRN: [id number]   LOS: 102 days   Subjective: Sleeping, no c/o.   Objective: Vital signs in last 24 hours: Temp:  [98.2 F (36.8 C)-98.6 F (37 C)] 98.2 F (36.8 C) (07/14 0518) Pulse Rate:  [76-82] 76 (07/14 0518) Resp:  [18] 18 (07/14 0518) BP: (113-122)/(66-77) 120/77 mmHg (07/14 0518) SpO2:  [99 %-100 %] 99 % (07/14 0518) Weight:  [81.647 kg (180 lb)-82.8 kg (182 lb 8.7 oz)] 82.8 kg (182 lb 8.7 oz) (07/14 0518) Last BM Date: 05/11/16   CT No air leak 33ml/24h    Radiology Results MRI HEAD WITHOUT AND WITH CONTRAST  TECHNIQUE: Multiplanar, multiecho pulse sequences of the brain and surrounding structures were obtained without and with intravenous contrast.  CONTRAST: 18mL MULTIHANCE GADOBENATE DIMEGLUMINE 529 MG/ML IV SOLN  COMPARISON: CT of the head/venogram dated 05/17/2016 and MRI of the brain dated 05/10/2016.  FINDINGS: Brain: Interval near-complete resolution of diffusion signal abnormality within the right superior frontal lobe from the prior MRI of the brain. The area now demonstrates increased T2 FLAIR hyperintensity involving both cortex and subcortical white matter with mild mass effect. Foci of susceptibility hypointensity within the region of infarction are stable and compatible with hemorrhage. There is some gyriform T1 hyperintensity within the region of infarct probably representing combination blood breakdown products and laminar necrosis. There is diffuse gyriform cortical enhancement with some extension into subcortical white matter. The distribution of enhancement is similar in comparison to prior CTV of the brain given differences in technique and corresponds to the areas of diffusion restriction on the prior MRI.  Additionally, there are numerous stable foci of T2 FLAIR hyperintensity left subcortical and deep white matter with involvement of the corpus  callosum concentrated in the genu and splenium as well as within the internal capsules bilaterally. No hemorrhage in the brainstem or cerebellum is identified.  There is no additional focus of abnormal enhancement of the brain, new susceptibility hyperintensity to indicate new intracranial hemorrhage, or new area of diffusion restriction to suggest acute infarct.  Extra-axial space: Normal ventricular size. No hydrocephalus or midline shift. No effacement of basilar cisterns. No extra-axial collection is identified. Proximal intracranial flow voids are maintained. No abnormality of the cervical medullary junction.  Other: No abnormal signal of the paranasal sinuses. Partial bilateral mastoid opacification. Inner ear structures are unremarkable. Orbits are unremarkable. Calvarium is unremarkable.  IMPRESSION: 1. Region of signal abnormality in the right superior frontal lobe demonstrates signal characteristics most consistent with a late subacute to early chronic hemorrhagic infarct with normalization of diffusion and enhancement of the regions of prior restriction. This may be due to direct cortical contusion or vascular injury with secondary infarct but is likely related to recent trauma. 2. Stable diffuse foci of hemorrhage in the brain deep white matter compatible with diffuse axonal injury. 3. No evidence for new intracranial hemorrhage, acute infarct, or new area of mass effect.   Electronically Signed  By: Mitzi Hansen M.D.  On: 05/20/2016 13:28   Physical Exam General appearance: alert and no distress Resp: clear to auscultation bilaterally Cardio: regular rate and rhythm GI: Soft, +BS   Assessment/Plan: Jump from hotel/multiple toxic ingestions- Continue on Zoloft, sitter for safety.  S/P ex lap, hepatorraphy, SBR, cholecystectomy, preperitoneal pelvic packing [name] 4/3  Ex lap for acidosis/bleeding 4/4 [place] Ex lap for bleeding 4/5  [name] S/P ex fix pelvis [name] 4/3 S/P ex lap 4/7 [place] S/P ex  lap 4/10 [place] S/P sacral screws 4/10 [name] S/P application ABRA, open gastrostomy tube 4/12 [name]  S/P abd closure, trach 4/24 [name] Mult B rib FX/R HPTX, B pulm contusions  Multiple toxic ingestions Mult L wrist FXs - ok to remove splint per Dr. Melvyn Novasrtmann  Pelvic FX - S/P ex fix and angioembolization, S/P sacral screw by Dr. Carola FrostHandy. WBAT BLE. ABL anemia - Improved Right empyema s/p thoracotomy with recurrence s/p right thoracotomy, drainage--CT per CT surgery. Hold blood thinner per CT surgery. Full course of Septra ended. 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 Appreciate neurology F/U.  FEN - Give mag citrate VTE - SCD's, ASA per neuro Dispo - Eventual [facility] or [facility]    Freeman CaldronMichael J. Jameka Ivie, PA-C Pager: [phone] General Trauma PA Pager: [phone]  05/21/2016
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
## 2016-05-22 ENCOUNTER — Inpatient Hospital Stay (HOSPITAL_COMMUNITY): Payer: Medicaid Other

## 2016-05-22 NOTE — Progress Notes (Signed)
Pt refused her morning medications and dressing change. Per pt she would like to do everything at 3pm. Informed the patient that I would be checking on her every hour, but we could do the dressing change at 3pm. Placed care order/instruction to do dressing change at 3pm today.   Leonidas Rombergaitlin S Bumbledare, RN
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
## 2016-05-22 NOTE — Progress Notes (Addendum)
      [address]       [place] [id number]             [phone]       8 Days Post-Op Procedure(s) (LRB): RIGHT VIDEO ASSISTED THORACOSCOPY (VATS),DRAINAGE OF EMPYEMA (Right) TRANSESOPHAGEAL ECHOCARDIOGRAM (TEE) (N/A) EVACUATION HEMATOMA (Right)  Subjective: Patient is sleeping.  Objective: Vital signs in last 24 hours: Temp:  [98 F (36.7 C)-98.3 F (36.8 C)] 98.3 F (36.8 C) (07/15 0545) Pulse Rate:  [78-87] 78 (07/15 0545) Cardiac Rhythm:  [-] Normal sinus rhythm (07/14 1000) Resp:  [18] 18 (07/15 0545) BP: (118-130)/(64-79) 130/79 mmHg (07/15 0545) SpO2:  [100 %] 100 % (07/15 0545) Weight:  [186 lb 11.7 oz (84.7 kg)] 186 lb 11.7 oz (84.7 kg) (07/15 0545)      Intake/Output from previous day: 07/14 0701 - 07/15 0700 In: 720 [P.O.:720] Out: -    Physical Exam:  Cardiovascular: RRR Pulmonary: Clear to auscultation on left and diminished right base Wounds: Dressings are cllean and dry.   Chest Tube: to suction, old blood in Pleura Vac  Lab Results: CBC:No results for input(s): WBC, HGB, HCT, PLT in the last 72 hours. BMET: No results for input(s): NA, K, CL, CO2, GLUCOSE, BUN, CREATININE, CALCIUM in the last 72 hours.  PT/INR: No results for input(s): LABPROT, INR in the last 72 hours. ABG:  INR: Will add last result for INR, ABG once components are confirmed Will add last 4 CBG results once components are confirmed  Assessment/Plan:  1. CV - SR in the 70-80's. 2.  Pulmonary - No output recorded last 24 hours. Chest tube is to suction.. Chest tube to remain for now. CXR this am appears stable. Encourage incentive spirometer. 3. Continue current wound care. Staples likely to be removed on 07/21. 4. Management per trauma  ZIMMERMAN,DONIELLE MPA-C 05/22/2016,8:50 AM  I have seen and examined the patient and agree with the assessment and plan as outlined.  Purcell Nailslarence H Owen, MD 05/22/2016 11:57 AM

## 2016-05-23 ENCOUNTER — Inpatient Hospital Stay (HOSPITAL_COMMUNITY): Payer: Medicaid Other
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
## 2016-05-23 NOTE — Progress Notes (Addendum)
      [address]       [place] [id number]             [phone]       9 Days Post-Op Procedure(s) (LRB): RIGHT VIDEO ASSISTED THORACOSCOPY (VATS),DRAINAGE OF EMPYEMA (Right) TRANSESOPHAGEAL ECHOCARDIOGRAM (TEE) (N/A) EVACUATION HEMATOMA (Right)  Subjective: Patient is awake and pleasant.  Objective: Vital signs in last 24 hours: Temp:  [98.3 F (36.8 C)-98.4 F (36.9 C)] 98.4 F (36.9 C) (07/16 0356) Pulse Rate:  [73-87] 73 (07/16 0356) Cardiac Rhythm:  [-]  Resp:  [18-19] 18 (07/16 0356) BP: (124-127)/(75-81) 124/81 mmHg (07/16 0356) SpO2:  [100 %] 100 % (07/16 0356) Weight:  [182 lb 8.7 oz (82.8 kg)] 182 lb 8.7 oz (82.8 kg) (07/16 0356)      Intake/Output from previous day: 07/15 0701 - 07/16 0700 In: 600 [P.O.:600] Out: -    Physical Exam:  Cardiovascular: RRR Pulmonary: Clear to auscultation on left and diminished right base Wounds: Dressings are clean and dry.   Chest Tube: to suction, , small (+1) air leak with cough, old blood in Pleura Vac  Lab Results: CBC:No results for input(s): WBC, HGB, HCT, PLT in the last 72 hours. BMET: No results for input(s): NA, K, CL, CO2, GLUCOSE, BUN, CREATININE, CALCIUM in the last 72 hours.  PT/INR: No results for input(s): LABPROT, INR in the last 72 hours. ABG:  INR: Will add last result for INR, ABG once components are confirmed Will add last 4 CBG results once components are confirmed  Assessment/Plan:  1. CV - SR in the 70-80's. 2.  Pulmonary - No output last 24 hours. Chest tube is to suction and has a +1 air leak with cough. Chest tube to remain for now. CXR this am appears stable. Encourage incentive spirometer. 3. Continue current wound care. Staples likely to be removed on 07/21. 4. Management per trauma  ZIMMERMAN,DONIELLE MPA-C 05/23/2016,7:30 AM    I have seen and examined the patient and agree with the assessment and plan as outlined.  Chest tube to water seal  Purcell Nailslarence H  Owen, MD 05/23/2016 10:58 AM

## 2016-05-24 ENCOUNTER — Inpatient Hospital Stay (HOSPITAL_COMMUNITY): Payer: Medicaid Other

## 2016-05-24 LAB — CBC WITH DIFFERENTIAL/PLATELET
BASOS ABS: 0 10*3/uL (ref 0.0–0.1)
BASOS PCT: 0 %
EOS ABS: 0.7 10*3/uL (ref 0.0–0.7)
Eosinophils Relative: 9 %
HEMATOCRIT: 27.4 % — AB (ref 36.0–46.0)
HEMOGLOBIN: 8.4 g/dL — AB (ref 12.0–15.0)
Lymphocytes Relative: 24 %
Lymphs Abs: 2 10*3/uL (ref 0.7–4.0)
MCH: 26.8 pg (ref 26.0–34.0)
MCHC: 30.7 g/dL (ref 30.0–36.0)
MCV: 87.5 fL (ref 78.0–100.0)
Monocytes Absolute: 0.8 10*3/uL (ref 0.1–1.0)
Monocytes Relative: 9 %
NEUTROS ABS: 5 10*3/uL (ref 1.7–7.7)
NEUTROS PCT: 58 %
Platelets: 332 10*3/uL (ref 150–400)
RBC: 3.13 MIL/uL — AB (ref 3.87–5.11)
RDW: 15.3 % (ref 11.5–15.5)
WBC: 8.5 10*3/uL (ref 4.0–10.5)

## 2016-05-24 NOTE — Progress Notes (Signed)
Trauma Service Note  Subjective: In great spirits and looking great.  Objective: Vital signs in last 24 hours: Temp:  [98.3 F (36.8 C)] 98.3 F (36.8 C) (07/16 2000) Pulse Rate:  [75-80] 75 (07/16 2000) Resp:  [17-18] 17 (07/16 2000) BP: (118-129)/(65-77) 129/77 mmHg (07/16 2000) SpO2:  [100 %] 100 % (07/16 2000) Last BM Date: 05/23/16  Intake/Output from previous day: 07/16 0701 - 07/17 0700 In: 960 [P.O.:960] Out: 10 [Chest Tube:10] Intake/Output this shift:    General: No distress  Lungs: Much clearer.  Minimal CT output.  CXR improved, but still atelectasis right base  Abd: Benign  Extremities: Intact  Neuro: Now able to flex right lower extremity.  Lab Results: CBC   Recent Labs  05/24/16 0245  WBC 8.5  HGB 8.4*  HCT 27.4*  PLT 332   BMET No results for input(s): NA, K, CL, CO2, GLUCOSE, BUN, CREATININE, CALCIUM in the last 72 hours. PT/INR No results for input(s): LABPROT, INR in the last 72 hours. ABG No results for input(s): PHART, HCO3 in the last 72 hours.  Invalid input(s): PCO2, PO2  Studies/Results: Dg Chest Port 1 View  05/24/2016  CLINICAL DATA:  Followup right-sided chest tube.  Chest soreness. EXAM: PORTABLE CHEST 1 VIEW COMPARISON:  05/23/2016 and multiple previous FINDINGS: Heart size remains normal. The left chest remains clear. Chest tube on the right remains in place without pneumothorax. Pleural and/or extra pleural thickening/fluid laterally on the right is unchanged. Multiple right-sided rib fractures appear unchanged. IMPRESSION: No pneumothorax. No change in right sided pleural/ extrapleural density. Electronically Signed   By: Paulina FusiMark  Shogry M.D.   On: 05/24/2016 07:33   Dg Chest Port 1 View  05/23/2016  CLINICAL DATA:  Chest tube.  Pneumothorax. EXAM: PORTABLE CHEST 1 VIEW COMPARISON:  05/22/2016. FINDINGS: The heart size is normal. Lung volumes are low. Right-sided chest tube remains in place. A right pleural effusion is slightly  decreased compared to the prior study. The left lung is clear. Atelectasis in the right lung is stable. Right-sided rib fractures are again noted. IMPRESSION: 1. Slight decrease in right pleural effusion. The chest tube remains in place. 2. Low lung volumes. Electronically Signed   By: Marin Robertshristopher  Mattern M.D.   On: 05/23/2016 08:05    Anti-infectives: Anti-infectives    Start     Dose/Rate Route Frequency Ordered Stop   05/15/16 1430  cefUROXime (ZINACEF) 1.5 g in dextrose 5 % 50 mL IVPB     1.5 g 100 mL/hr over 30 Minutes Intravenous Every 12 hours 05/15/16 1042 05/17/16 0255   05/14/16 1400  cefUROXime (ZINACEF) 1.5 g in dextrose 5 % 50 mL IVPB     1.5 g 100 mL/hr over 30 Minutes Intravenous Every 12 hours 05/14/16 1255 05/15/16 0300   05/10/16 1230  sulfamethoxazole-trimethoprim (BACTRIM DS,SEPTRA DS) 800-160 MG per tablet 1 tablet  Status:  Discontinued     1 tablet Oral Every 12 hours 05/10/16 1122 05/21/16 0742   05/07/16 1300  ciprofloxacin (CIPRO) tablet 500 mg  Status:  Discontinued     500 mg Oral 2 times daily 05/07/16 1200 05/10/16 1122   05/06/16 1700  ceFEPIme (MAXIPIME) 2 g in dextrose 5 % 50 mL IVPB  Status:  Discontinued     2 g 100 mL/hr over 30 Minutes Intravenous Every 8 hours 05/06/16 1316 05/07/16 1200   04/27/16 1000  ceFEPIme (MAXIPIME) 2 g in dextrose 5 % 50 mL IVPB  Status:  Discontinued  2 g 100 mL/hr over 30 Minutes Intravenous Every 12 hours 04/27/16 0818 05/06/16 1316   04/22/16 1200  piperacillin-tazobactam (ZOSYN) IVPB 3.375 g  Status:  Discontinued     3.375 g 12.5 mL/hr over 240 Minutes Intravenous Every 6 hours 04/22/16 1039 04/22/16 1042   04/22/16 1200  vancomycin (VANCOCIN) 1,500 mg in sodium chloride 0.9 % 500 mL IVPB  Status:  Discontinued     1,500 mg 250 mL/hr over 120 Minutes Intravenous Every 12 hours 04/22/16 1133 04/28/16 0115   04/22/16 1130  piperacillin-tazobactam (ZOSYN) IVPB 3.375 g  Status:  Discontinued     3.375 g 12.5 mL/hr  over 240 Minutes Intravenous Every 8 hours 04/22/16 1043 04/27/16 0754   04/02/16 1200  fluconazole (DIFLUCAN) 40 MG/ML suspension 400 mg     400 mg Per Tube  Once 04/02/16 1026 04/02/16 1442   03/31/16 1000  sulfamethoxazole-trimethoprim (BACTRIM,SEPTRA) 200-40 MG/5ML suspension 40 mL  Status:  Discontinued     40 mL Per Tube Every 12 hours 03/31/16 0927 03/31/16 0956   03/31/16 1000  sulfamethoxazole-trimethoprim (BACTRIM,SEPTRA) 200-40 MG/5ML suspension 20 mL     20 mL Per Tube Every 6 hours 03/31/16 0956 04/03/16 2359   03/26/16 1030  cefTRIAXone (ROCEPHIN) 2 g in dextrose 5 % 50 mL IVPB  Status:  Discontinued     2 g 100 mL/hr over 30 Minutes Intravenous Every 24 hours 03/26/16 1023 03/31/16 0921   03/25/16 1000  ceFEPIme (MAXIPIME) 2 g in dextrose 5 % 50 mL IVPB  Status:  Discontinued     2 g 100 mL/hr over 30 Minutes Intravenous Every 12 hours 03/25/16 0829 03/26/16 1023   03/08/16 1400  levofloxacin (LEVAQUIN) IVPB 750 mg     750 mg 100 mL/hr over 90 Minutes Intravenous Every 24 hours 03/08/16 0912 03/10/16 1501   03/06/16 1400  ceFEPIme (MAXIPIME) 2 g in dextrose 5 % 50 mL IVPB  Status:  Discontinued     2 g 100 mL/hr over 30 Minutes Intravenous Every 12 hours 03/06/16 1344 03/08/16 0912   03/01/16 1330  ceFAZolin (ANCEF) IVPB 2g/100 mL premix     2 g 200 mL/hr over 30 Minutes Intravenous To [facility] 02/29/16 0917 03/01/16 1452   02/11/16 0600  piperacillin-tazobactam (ZOSYN) IVPB 3.375 g  Status:  Discontinued     3.375 g 100 mL/hr over 30 Minutes Intravenous 4 times per day 02/10/16 2214 02/22/16 0853   02/10/16 1730  piperacillin-tazobactam (ZOSYN) IVPB 3.375 g  Status:  Discontinued     3.375 g 12.5 mL/hr over 240 Minutes Intravenous 3 times per day 02/10/16 1727 02/10/16 2214      Assessment/Plan: s/p Procedure(s): RIGHT VIDEO ASSISTED THORACOSCOPY (VATS),DRAINAGE OF EMPYEMA TRANSESOPHAGEAL ECHOCARDIOGRAM (TEE) EVACUATION HEMATOMA Doing quite well.   Disposition is the question once her chest tube is removed.  LOS: 105 days   Marta Lamas. Gae Bon, MD, FACS [phone] Trauma Surgeon 05/24/2016

## 2016-05-24 NOTE — Progress Notes (Addendum)
      [address]       [place] [id number]             [phone]      10 Days Post-Op Procedure(s) (LRB): RIGHT VIDEO ASSISTED THORACOSCOPY (VATS),DRAINAGE OF EMPYEMA (Right) TRANSESOPHAGEAL ECHOCARDIOGRAM (TEE) (N/A) EVACUATION HEMATOMA (Right)   Subjective:  Feeling good, looks great.  She states she has regained some sensation and movement back in her left leg.  Objective: Vital signs in last 24 hours: Temp:  [98.3 F (36.8 C)] 98.3 F (36.8 C) (07/16 2000) Pulse Rate:  [75-80] 75 (07/16 2000) Cardiac Rhythm:  [-]  Resp:  [17-18] 17 (07/16 2000) BP: (118-129)/(65-77) 129/77 mmHg (07/16 2000) SpO2:  [100 %] 100 % (07/16 2000)  Intake/Output from previous day: 07/16 0701 - 07/17 0700 In: 960 [P.O.:960] Out: 10 [Chest Tube:10]  General appearance: alert, cooperative and no distress Heart: regular rate and rhythm Lungs: clear to auscultation bilaterally Abdomen: soft, non-tender; bowel sounds normal; no masses,  no organomegaly Wound: clean, no purulence noted, staples remain in place  Lab Results:  Recent Labs  05/24/16 0245  WBC 8.5  HGB 8.4*  HCT 27.4*  PLT 332   BMET: No results for input(s): NA, K, CL, CO2, GLUCOSE, BUN, CREATININE, CALCIUM in the last 72 hours.  PT/INR: No results for input(s): LABPROT, INR in the last 72 hours. ABG    Component Value Date/Time   PHART 7.375 05/15/2016 0345   HCO3 24.3* 05/15/2016 0345   TCO2 25.6 05/15/2016 0345   ACIDBASEDEF 0.3 05/15/2016 0345   O2SAT 97.3 05/15/2016 0345   CBG (last 3)  No results for input(s): GLUCAP in the last 72 hours.  Assessment/Plan: S/P Procedure(s) (LRB): RIGHT VIDEO ASSISTED THORACOSCOPY (VATS),DRAINAGE OF EMPYEMA (Right) TRANSESOPHAGEAL ECHOCARDIOGRAM (TEE) (N/A) EVACUATION HEMATOMA (Right)  1. Chest tube- 10 cc output yesterday, no air leak present on water seal 2. Pulm- CXR with small effusion/post operative changes on right 3. Dispo- care per trauma,  minimal chest tube output on water seal, continue for now   LOS: 105 days    BARRETT, ERIN 05/24/2016   Chart reviewed, patient examined, agree with above. Chest tube output is minimal and CXR has been very stable. Operative cultures were negative. I think chest tube can be removed and would not put her on any anticoagulants.

## 2016-05-24 NOTE — Clinical Social Work Note (Signed)
CSW continues to follow for discharge needs. CSW will make referrals to [facility] once the patient's chest tubes are able to be removed.    Roddie McBryant Aidden Markovic MSW, [place] Valley[place]CSW, [place], [phone]

## 2016-05-24 NOTE — Progress Notes (Signed)
Occupational therapy Evaluation Note:  Pt initially required min encouragement for full participation, but with minimal prompting agreeable to sit EOB and progress to OOB, however, once EOB, RN arrived to remove chest tube.  Pt able to stand today with min A +2 using Stedy.  Pt very animated and excited re: progress stating "I'm ready to move on out of here, no offense to you guys".   Pt now eager to progress and regain independence.  Feel she is, or soon will be ready for CIR level therapies.      05/24/16 1300  OT Visit Information  Last OT Received On 05/24/16  Assistance Needed +2  History of Present Illness [name] Bell is a 35 y.o. female admitted to [facility] on 02/09/16 s/p attempted suicide: taped DNR note to chest, drank Drano, then jumped off 5 story building ([address]).S/p 02/09/16 gel foam embolization of bil internal iliac arteries.S/p 02/09/16 ex lap with cholecystectomy, resection SB and mesenteric repair, liver lac repair, application or wound vac.S/p 02/09/16 closed reduction of pelvic ring, external pelvic fixation.Also has right metacarpal fracture in splint. Pt on and off intubation via trach/ and on/off trac collar.  Pelvic x- fixator pins removed at bedside on 03/23/16.  Underwent thoracotomy 6/19 due to empyema/abcess with 2 chest tubes, one d/c 04/30/16, second d/c 05/05/16 and pt no longer has trach.  Pt with no significant PMHx listed in the chart. Pt had acute speech and left sided weakness changes on7/3/17 and both CT and MRI confirmed stroke (R frontoparietal).  Thoracotomy and evac of hematoma 05/14/16.    Precautions  Precautions Fall  Precaution Comments left hemiparesis, chest tube  Required Braces or Orthoses Other Brace/Splint  Other Brace/Splint L PRAFO  Pain Assessment  Pain Assessment Faces  Faces Pain Scale 4  Pain Location Lt chest due to chest tube   Pain Descriptors / Indicators Grimacing  Pain Intervention(s) Monitored during session;Repositioned  Cognition   Arousal/Alertness Awake/alert  Behavior During Therapy WFL for tasks assessed/performed  Overall Cognitive Status Impaired/Different from baseline  Area of Impairment Attention;Awareness;Problem solving  Current Attention Level Selective  Following Commands Follows multi-step commands inconsistently  Awareness Emergent  Problem Solving Slow processing;Requires verbal cues  Bed Mobility  Overal bed mobility Needs Assistance  Rolling Mod assist  Sidelying to sit Mod assist;+2 for physical assistance;HOB elevated  Sit to sidelying HOB elevated;Mod assist  General bed mobility comments Pt with complaint of pain at site of chest tube when rolling to Rt.  With HOB elevated, she required assist to lift trunk and assist to move LEs onto and off of bed   Balance  Overall balance assessment Needs assistance  Sitting-balance support Feet supported  Sitting balance-[name] Fair  Sitting balance - Comments Pt sat EOB with min guard assist   Standing balance-[name] Poor  Standing balance comment requires bil. UE support.  Pt stood for ~2 mins with min A +2 using UE support .  c/o bil. calf tightness   Restrictions  Weight Bearing Restrictions Yes  RUE Weight Bearing WBAT  LUE Weight Bearing WBAT  RLE Weight Bearing WBAT  LLE Weight Bearing WBAT  Transfers  Overall transfer level Needs assistance  Equipment used Ambulation equipment used  Transfer via Psychologist, prison and probation services Sit to/from Stand  Sit to [employer] physical assistance  General transfer comment Pt required min A +2 to move into standing position using bed pad to facilitate lift of hips and hip extension .  OT - End of Session  Equipment Utilized During Treatment Other (comment) Antony Salmon(Stedy )  Activity Tolerance Patient tolerated treatment well  Patient left in bed;with call bell/phone within reach;with nursing/sitter in room  Nurse Communication Mobility status  OT Assessment/Plan  OT Plan Discharge plan  remains appropriate  OT Frequency (ACUTE ONLY) Min 3X/week  Recommendations for Other Services Rehab consult  Follow Up Recommendations CIR;Supervision/Assistance - 24 hour  OT Equipment 3 in 1 bedside comode  OT Goal Progression  Progress towards OT goals Progressing toward goals  Acute Rehab OT Goals  Patient Stated Goal Sit EOB for 60 seconds  OT Goal Formulation With patient  Time For Goal Achievement 05/25/16  Potential to Achieve Goals Good  ADL Goals  Additional ADL Goal #1 Pt will complete meaningful activity with minimal encouragement  Additional ADL Goal #2 Pt will sit unsupported for 5 minutes at min guard (A) level as precursor to adls.  Additional ADL Goal #3 Pt will demonstrate sit <>Stand with total +2 Mod (A)   Pt Will Perform Lower Body Bathing with mod assist;sitting/lateral leans  Pt Will Transfer to Toilet with +2 assist;with min assist;stand pivot transfer  Pt/caregiver will Perform Home Exercise Program Increased strength;Left upper extremity;With minimal assist  Additional ADL Goal #4 Complete bed mobility with S for ADL  Pt Will Perform Toileting - Clothing Manipulation and hygiene with min assist;sit to/from stand  OT Time Calculation  OT Start Time (ACUTE ONLY) 1307  OT Stop Time (ACUTE ONLY) 1341  OT Time Calculation (min) 34 min  OT General Charges  $OT Visit 1 Procedure  OT Treatments  $Neuromuscular Re-education 23-37 mins  Reynolds AmericanWendi Danyel Griess, OTR/L [phone]

## 2016-05-24 NOTE — Progress Notes (Signed)
Chest tube removed per order. 

## 2016-05-24 NOTE — Progress Notes (Signed)
Physical Therapy Treatment Patient Details Name: [name] Bell MRN: [id number] DOB: 1980-12-27 Today's Date: 05/24/2016    History of Present Illness [name] Bell is a 35 y.o. female admitted to [facility] on 02/09/16 s/p attempted suicide: taped DNR note to chest, drank Drano, then jumped off 5 story building ([address]).S/p 02/09/16 gel foam embolization of bil internal iliac arteries.S/p 02/09/16 ex lap with cholecystectomy, resection SB and mesenteric repair, liver lac repair, application or wound vac.S/p 02/09/16 closed reduction of pelvic ring, external pelvic fixation.Also has right metacarpal fracture in splint. Pt on and off intubation via trach/ and on/off trac collar.  Pelvic x- fixator pins removed at bedside on 03/23/16.  Underwent thoracotomy 6/19 due to empyema/abcess with 2 chest tubes, one d/c 04/30/16, second d/c 05/05/16 and pt no longer has trach.  Pt with no significant PMHx listed in the chart. Pt had acute speech and left sided weakness changes on7/3/17 and both CT and MRI confirmed stroke (R frontoparietal).  Thoracotomy and evac of hematoma 05/14/16.      PT Comments    Pt was feeling good when PT arrived. Her chest tube was just removed. She transferred from bed to chair with steady. She is able to pull up into the steady but needs assistance to straighten her hips. Some movement has returned to her LLE; she can flex her hip and knee, but is unable to extend at this time. Discussed using a walker next session to transfer and pt was agreeable. She continues to show improvement and would benefit from continued PT to restore her functional independence.   Follow Up Recommendations  CIR     Equipment Recommendations  Wheelchair (measurements PT);Wheelchair cushion (measurements PT);Hospital bed;Other (comment);3in1 (PT);Rolling walker with 5" wheels    Recommendations for Other Services Rehab consult     Precautions / Restrictions Precautions Precautions: Fall Precaution  Comments: left hemiparesis Required Braces or Orthoses: Other Brace/Splint Other Brace/Splint: L PRAFO Restrictions Weight Bearing Restrictions: Yes RUE Weight Bearing: Weight bearing as tolerated LUE Weight Bearing: Weight bearing as tolerated RLE Weight Bearing: Weight bearing as tolerated LLE Weight Bearing: Weight bearing as tolerated    Mobility  Bed Mobility Overal bed mobility: Needs Assistance Bed Mobility: Supine to Sit Supine to sit: Min assist;HOB elevated    General bed mobility comments: Pt attempted supine to sit independently but could not complete due to weakness. She was able to move into long sitting with hand held assist from the therapist on the R and the L hand on the bed rail. She required assistance maneuvering her LLE to EOB. Once her feet were on the floor she was able to reposition her hips so they were perpendicular to the EOB.  Transfers Overall transfer level: Needs assistance   Transfers: Sit to/from Stand Sit to Stand: Min assist;+2 physical assistance;+2 safety/equipment         General transfer comment: Pt was able to pull up on the stready with Bil UE. With the chuck pad under pts hips, 2+ assist was used to facilitate hip extension into standing. Only +1 min assist was required for stand to sit in the recliner chair.    Balance Overall balance assessment: Needs assistance Sitting-balance support: Feet supported;Bilateral upper extremity supported;Single extremity supported Sitting balance-[name]: Fair Sitting balance - Comments: Pt sat EOB needing mod assist to start and progressing to min guard assist  Postural control: Right lateral lean Standing balance support: Bilateral upper extremity supported Standing balance-[name]: Poor Standing balance comment:  Pt stood in the steady with BIL UE support. LOB x1 when she took one arm off the steady, but was able to recover independently.                    Cognition  Arousal/Alertness: Awake/alert Behavior During Therapy: WFL for tasks assessed/performed Overall Cognitive Status: Impaired/Different from baseline Area of Impairment: Attention   Current Attention Level: Selective   Following Commands: Follows one step commands consistently;Follows multi-step commands inconsistently   Awareness: Emergent Problem Solving: Slow processing;Requires verbal cues      Exercises General Exercises - Lower Extremity Ankle Circles/Pumps: AROM;PROM;Both;15 reps;Seated Long Arc Quad: AROM;Right;10 reps;Seated Hip ABduction/ADduction: AROM;Right;10 reps;Seated (pt unable to raise leg off chair due to fatigue. ) Hip Flexion/Marching: AROM;Right;10 reps;Seated        Pertinent Vitals/Pain Pain Assessment: No/denies pain            PT Goals (current goals can now be found in the care plan section) Acute Rehab PT Goals Patient Stated Goal: Get OOB and sit in chair PT Goal Formulation: With patient Time For Goal Achievement: 05/25/16 Potential to Achieve Goals: Good (given more time) Progress towards PT goals: Progressing toward goals    Frequency  Min 5X/week    PT Plan Current plan remains appropriate       End of Session   Activity Tolerance: Patient tolerated treatment well;Patient limited by fatigue Patient left: in chair;with call bell/phone within reach;with nursing/sitter in room     Time: [id number] PT Time Calculation (min) (ACUTE ONLY): 33 min  Charges:                       G CodesKallie Bell:      [name] Bell, Leda GauzeSPTA [phone]  05/24/2016, 4:32 PM

## 2016-05-25 ENCOUNTER — Inpatient Hospital Stay (HOSPITAL_COMMUNITY): Payer: Medicaid Other

## 2016-05-25 NOTE — Progress Notes (Addendum)
      [address]       [place] [id number]             [phone]      11 Days Post-Op Procedure(s) (LRB): RIGHT VIDEO ASSISTED THORACOSCOPY (VATS),DRAINAGE OF EMPYEMA (Right) TRANSESOPHAGEAL ECHOCARDIOGRAM (TEE) (N/A) EVACUATION HEMATOMA (Right)   Subjective:  [name] has no complaints.  States she is doing well.  She does ask if she can have local anesthesia when her staples are removed.  Objective: Vital signs in last 24 hours: Temp:  [98 F (36.7 C)-98.7 F (37.1 C)] 98.7 F (37.1 C) (07/18 0557) Pulse Rate:  [71-77] 77 (07/18 0557) Cardiac Rhythm:  [-]  Resp:  [18-20] 18 (07/18 0557) BP: (133-136)/(82-85) 133/84 mmHg (07/18 0557) SpO2:  [99 %-100 %] 99 % (07/18 0557) Weight:  [176 lb 2.4 oz (79.9 kg)] 176 lb 2.4 oz (79.9 kg) (07/18 0557)  Intake/Output from previous day: 07/17 0701 - 07/18 0700 In: 720 [P.O.:720] Out: 10 [Chest Tube:10]  General appearance: alert, cooperative and no distress Heart: regular rate and rhythm Lungs: clear to auscultation bilaterally and except diminished in right base Abdomen: soft, non-tender; bowel sounds normal; no masses,  no organomegaly Wound: clean and dry  Lab Results:  Recent Labs  05/24/16 0245  WBC 8.5  HGB 8.4*  HCT 27.4*  PLT 332   BMET: No results for input(s): NA, K, CL, CO2, GLUCOSE, BUN, CREATININE, CALCIUM in the last 72 hours.  PT/INR: No results for input(s): LABPROT, INR in the last 72 hours. ABG    Component Value Date/Time   PHART 7.375 05/15/2016 0345   HCO3 24.3* 05/15/2016 0345   TCO2 25.6 05/15/2016 0345   ACIDBASEDEF 0.3 05/15/2016 0345   O2SAT 97.3 05/15/2016 0345   CBG (last 3)  No results for input(s): GLUCAP in the last 72 hours.  Assessment/Plan: S/P Procedure(s) (LRB): RIGHT VIDEO ASSISTED THORACOSCOPY (VATS),DRAINAGE OF EMPYEMA (Right) TRANSESOPHAGEAL ECHOCARDIOGRAM (TEE) (N/A) EVACUATION HEMATOMA (Right)  1. Chest tube- removed yesterday, F/U CXR remains  stable in appearance, no pneumothorax present 2. ID- wounds are healing, staples in place... Will plan to remove on Friday 3. Dispo- care per trauma   LOS: 106 days    [name], [name] 05/25/2016   Chart reviewed, patient examined, agree with above. CXR looks stable. Do not put on any anticoagulants.

## 2016-05-25 NOTE — Progress Notes (Signed)
Trauma Service Note  Subjective: Not as talkative today, but doing okay.  Chest tube is out, pulled out yesterday.  Objective: Vital signs in last 24 hours: Temp:  [98 F (36.7 C)-98.7 F (37.1 C)] 98.7 F (37.1 C) (07/18 0557) Pulse Rate:  [71-77] 77 (07/18 0557) Resp:  [18-20] 18 (07/18 0557) BP: (133-136)/(82-85) 133/84 mmHg (07/18 0557) SpO2:  [99 %-100 %] 99 % (07/18 0557) Weight:  [79.9 kg (176 lb 2.4 oz)] 79.9 kg (176 lb 2.4 oz) (07/18 0557) Last BM Date: 05/23/16  Intake/Output from previous day: 07/17 0701 - 07/18 0700 In: 720 [P.O.:720] Out: 10 [Chest Tube:10] Intake/Output this shift:    General: No acute distress, but not as happy this AM  Lungs: CLear, but slightly diminished on the right side today.  Abd: Soft, benign, eating well.  Extremities: No changes.  No clinical signs or symptoms of DVT  Neuro: Able to move left leg even more today.  Much improvement.  Lab Results: CBC   Recent Labs  05/24/16 0245  WBC 8.5  HGB 8.4*  HCT 27.4*  PLT 332   BMET No results for input(s): NA, K, CL, CO2, GLUCOSE, BUN, CREATININE, CALCIUM in the last 72 hours. PT/INR No results for input(s): LABPROT, INR in the last 72 hours. ABG No results for input(s): PHART, HCO3 in the last 72 hours.  Invalid input(s): PCO2, PO2  Studies/Results: Dg Chest Port 1 View  05/25/2016  CLINICAL DATA:  Chest tube removal. EXAM: PORTABLE CHEST 1 VIEW COMPARISON:  05/24/2016. FINDINGS: Interval removal of right chest tube. No pneumothorax. Mediastinum hilar structures are unremarkable. Stable cardiomegaly. Mild bibasilar subsegmental atelectasis. Persistent right pleural thickening/effusion. Multiple displaced right rib fractures again noted. Surgical staples noted over the right chest wall. IMPRESSION: 1.  Interval removal of right chest tube.  No pneumothorax. 2. Multiple displaced right rib fractures with right pleural thickening/effusion again noted. No interim change.  Electronically Signed   By: Maisie Fus  Register   On: 05/25/2016 07:25   Dg Chest Port 1 View  05/24/2016  CLINICAL DATA:  Followup right-sided chest tube.  Chest soreness. EXAM: PORTABLE CHEST 1 VIEW COMPARISON:  05/23/2016 and multiple previous FINDINGS: Heart size remains normal. The left chest remains clear. Chest tube on the right remains in place without pneumothorax. Pleural and/or extra pleural thickening/fluid laterally on the right is unchanged. Multiple right-sided rib fractures appear unchanged. IMPRESSION: No pneumothorax. No change in right sided pleural/ extrapleural density. Electronically Signed   By: Paulina Fusi M.D.   On: 05/24/2016 07:33    Anti-infectives: Anti-infectives    Start     Dose/Rate Route Frequency Ordered Stop   05/15/16 1430  cefUROXime (ZINACEF) 1.5 g in dextrose 5 % 50 mL IVPB     1.5 g 100 mL/hr over 30 Minutes Intravenous Every 12 hours 05/15/16 1042 05/17/16 0255   05/14/16 1400  cefUROXime (ZINACEF) 1.5 g in dextrose 5 % 50 mL IVPB     1.5 g 100 mL/hr over 30 Minutes Intravenous Every 12 hours 05/14/16 1255 05/15/16 0300   05/10/16 1230  sulfamethoxazole-trimethoprim (BACTRIM DS,SEPTRA DS) 800-160 MG per tablet 1 tablet  Status:  Discontinued     1 tablet Oral Every 12 hours 05/10/16 1122 05/21/16 0742   05/07/16 1300  ciprofloxacin (CIPRO) tablet 500 mg  Status:  Discontinued     500 mg Oral 2 times daily 05/07/16 1200 05/10/16 1122   05/06/16 1700  ceFEPIme (MAXIPIME) 2 g in dextrose 5 % 50 mL IVPB  Status:  Discontinued     2 g 100 mL/hr over 30 Minutes Intravenous Every 8 hours 05/06/16 1316 05/07/16 1200   04/27/16 1000  ceFEPIme (MAXIPIME) 2 g in dextrose 5 % 50 mL IVPB  Status:  Discontinued     2 g 100 mL/hr over 30 Minutes Intravenous Every 12 hours 04/27/16 0818 05/06/16 1316   04/22/16 1200  piperacillin-tazobactam (ZOSYN) IVPB 3.375 g  Status:  Discontinued     3.375 g 12.5 mL/hr over 240 Minutes Intravenous Every 6 hours 04/22/16 1039  04/22/16 1042   04/22/16 1200  vancomycin (VANCOCIN) 1,500 mg in sodium chloride 0.9 % 500 mL IVPB  Status:  Discontinued     1,500 mg 250 mL/hr over 120 Minutes Intravenous Every 12 hours 04/22/16 1133 04/28/16 0115   04/22/16 1130  piperacillin-tazobactam (ZOSYN) IVPB 3.375 g  Status:  Discontinued     3.375 g 12.5 mL/hr over 240 Minutes Intravenous Every 8 hours 04/22/16 1043 04/27/16 0754   04/02/16 1200  fluconazole (DIFLUCAN) 40 MG/ML suspension 400 mg     400 mg Per Tube  Once 04/02/16 1026 04/02/16 1442   03/31/16 1000  sulfamethoxazole-trimethoprim (BACTRIM,SEPTRA) 200-40 MG/5ML suspension 40 mL  Status:  Discontinued     40 mL Per Tube Every 12 hours 03/31/16 0927 03/31/16 0956   03/31/16 1000  sulfamethoxazole-trimethoprim (BACTRIM,SEPTRA) 200-40 MG/5ML suspension 20 mL     20 mL Per Tube Every 6 hours 03/31/16 0956 04/03/16 2359   03/26/16 1030  cefTRIAXone (ROCEPHIN) 2 g in dextrose 5 % 50 mL IVPB  Status:  Discontinued     2 g 100 mL/hr over 30 Minutes Intravenous Every 24 hours 03/26/16 1023 03/31/16 0921   03/25/16 1000  ceFEPIme (MAXIPIME) 2 g in dextrose 5 % 50 mL IVPB  Status:  Discontinued     2 g 100 mL/hr over 30 Minutes Intravenous Every 12 hours 03/25/16 0829 03/26/16 1023   03/08/16 1400  levofloxacin (LEVAQUIN) IVPB 750 mg     750 mg 100 mL/hr over 90 Minutes Intravenous Every 24 hours 03/08/16 0912 03/10/16 1501   03/06/16 1400  ceFEPIme (MAXIPIME) 2 g in dextrose 5 % 50 mL IVPB  Status:  Discontinued     2 g 100 mL/hr over 30 Minutes Intravenous Every 12 hours 03/06/16 1344 03/08/16 0912   03/01/16 1330  ceFAZolin (ANCEF) IVPB 2g/100 mL premix     2 g 200 mL/hr over 30 Minutes Intravenous To [facility] 02/29/16 0917 03/01/16 1452   02/11/16 0600  piperacillin-tazobactam (ZOSYN) IVPB 3.375 g  Status:  Discontinued     3.375 g 100 mL/hr over 30 Minutes Intravenous 4 times per day 02/10/16 2214 02/22/16 0853   02/10/16 1730  piperacillin-tazobactam  (ZOSYN) IVPB 3.375 g  Status:  Discontinued     3.375 g 12.5 mL/hr over 240 Minutes Intravenous 3 times per day 02/10/16 1727 02/10/16 2214      Assessment/Plan: s/p Procedure(s): RIGHT VIDEO ASSISTED THORACOSCOPY (VATS),DRAINAGE OF EMPYEMA TRANSESOPHAGEAL ECHOCARDIOGRAM (TEE) EVACUATION HEMATOMA COnsider disposition to Rehab.   No medical reasons why the patient cannot go to Rehab or [facility].  LOS: 106 days   Marta LamasJames O. Gae BonWyatt, III, MD, FACS [phone] Trauma Surgeon 05/25/2016

## 2016-05-25 NOTE — Addendum Note (Signed)
Addendum  created 05/25/16 2318 by Judie Petitharlene Edwards, MD   Modules edited: Anesthesia Responsible Staff

## 2016-05-25 NOTE — Progress Notes (Signed)
Physical Therapy Treatment Patient Details Name: [name] BobKatia D Bell MRN: [id number] DOB: [birth year] Today's Date: 05/25/2016    History of Present Illness [name] Bell is a 35 y.o. female [facility]16 s/p attempted suicide: taped DNR note to chest, drank Drano, then jumped off 5 story building ([address]S/p 02/09/16 gel foam embolization of bil internal iliac arteries.S/p 02/09/16 ex lap with cholecystectomy, resection SB and mesenteric repair, liver lac repair, application or wound vac.S/p 02/09/16 closed reduction of pelvic ring, external pelvic fixation.Also has right metacarpal fracture in splint. Pt on and off intubation via trach/ and on/off trac collar.  Pelvic x- fixator pins removed at bedside on 03/23/16.  Underwent thoracotomy 6/19 due to empyema/abcess with 2 chest tubes, one d/c 04/30/16, second d/c 05/05/16 and pt no longer has trach.  Pt with no significant PMHx listed in the chart. Pt had acute speech and left sided weakness changes on7/3/17 and both CT and MRI confirmed stroke (R frontoparietal).  Thoracotomy and evac of hematoma 05/14/16.      PT Comments    Patient was tired upon arrival but willing to work with PT. Once she reached EOB she was agreeable to standing with the RW. She required assistance to brace her L knee during static standing to prevent buckling. She is continuing to gain strength and motion on her LUE and performed AAROM on the LLE. Collaborated with PT to reassess goals, as they were due today.   Follow Up Recommendations  CIR     Equipment Recommendations  Wheelchair (measurements PT);Wheelchair cushion (measurements PT);Hospital bed;Other (comment);3in1 (PT);Rolling walker with 5" wheels    Recommendations for Other Services Rehab consult     Precautions / Restrictions Precautions Precautions: Fall Precaution Comments: left hemiparesis Required Braces or Orthoses: Other Brace/Splint Other Brace/Splint: L PRAFO Restrictions Weight  Bearing Restrictions: Yes RUE Weight Bearing: Weight bearing as tolerated LUE Weight Bearing: Weight bearing as tolerated RLE Weight Bearing: Weight bearing as tolerated LLE Weight Bearing: Weight bearing as tolerated    Mobility  Bed Mobility Overal bed mobility: Needs Assistance Bed Mobility: Supine to Sit;Sit to Sidelying;Rolling Rolling: Min assist   Supine to sit: Mod assist;HOB elevated   Sit to sidelying: Mod assist General bed mobility comments: Rolling bed mobilities performed as nurse tech performed peri care mid treatment. Pt required mod assist to raise torso off bed with HOB elevated for supine to sit. She was able perform a sitting pivot to EOB with min assist to move LLE. During sit to side lying she fully controlled her torso while therapist assisted with raising her LEs onto bed  Transfers Overall transfer level: Needs assistance Equipment used: Rolling walker (2 wheeled) Transfers: Sit to/from Stand (x2) Sit to Stand: Min assist;+2 physical assistance;+2 safety/equipment         General transfer comment: Pt requested to use the walker to stand today instead of the steady. Two therapists were required to brace the walker as the pt pulled up into standing. Both therapists assisted pt to standing with the chuck pad under pts hips. Both pts knees were braced against therapists knees.      Balance Overall balance assessment: Needs assistance Sitting-balance support: Single extremity supported;Bilateral upper extremity supported;No upper extremity supported;Feet supported Sitting balance-[name]: Fair Sitting balance - Comments: Pt sat EOB needing mod assist to start and progressing to supervision   Standing balance support: Bilateral upper extremity supported Standing balance-[name]: Poor Standing balance comment: Pt stood 2x. The first time was  for over a minute, the second was for only 10 seconds. Bil UE support is required to maintain balance in  standing                    Cognition Arousal/Alertness: Awake/alert Behavior During Therapy: WFL for tasks assessed/performed Overall Cognitive Status: Impaired/Different from baseline Area of Impairment: Attention   Current Attention Level: Selective   Following Commands: Follows one step commands consistently;Follows multi-step commands inconsistently   Awareness: Emergent Problem Solving: Slow processing;Requires verbal cues      Exercises General Exercises - Lower Extremity Ankle Circles/Pumps: AROM;PROM;Both;Supine;10 reps (clonus on L with PROM) Short Arc Quad: AROM;10 reps;PROM;Both;Supine Heel Slides: AROM;AAROM;Both;10 reps;Supine (Therapist to keep LLE from rotating ) Hip ABduction/ADduction: AROM;PROM;Both;10 reps;Supine    General Comments General comments (skin integrity, edema, etc.): Informed nurse of patient's abdominal discomfort.       Pertinent Vitals/Pain Pain Assessment: 0-10 Pain Score: 8  Pain Location: site of removed chest tube & abdomin Pain Descriptors / Indicators: Grimacing;Guarding Pain Intervention(s): Limited activity within patient's tolerance;Monitored during session;Repositioned           PT Goals (current goals can now be found in the care plan section) Acute Rehab PT Goals Patient Stated Goal: Sit EOB and stand for 1 minute PT Goal Formulation: With patient Time For Goal Achievement: 06/08/16 Potential to Achieve Goals: Good Progress towards PT goals: Progressing toward goals (Goal assesment due. Collaborated w/ PT.)    Frequency  Min 5X/week    PT Plan Current plan remains appropriate       End of Session Equipment Utilized During Treatment: Gait belt ((used for AAROM for heel slides on L)) Activity Tolerance: Patient tolerated treatment well;Patient limited by pain Patient left: in bed;with call bell/phone within reach;with nursing/sitter in room     Time: 1320-1418 PT Time Calculation (min) (ACUTE ONLY): 58  min  Charges:                       G Codes:      Kallie Locks 05/29/16, 5:05 PM

## 2016-05-26 LAB — BASIC METABOLIC PANEL
Anion gap: 9 (ref 5–15)
BUN: 18 mg/dL (ref 6–20)
CALCIUM: 10 mg/dL (ref 8.9–10.3)
CO2: 25 mmol/L (ref 22–32)
CREATININE: 0.9 mg/dL (ref 0.44–1.00)
Chloride: 103 mmol/L (ref 101–111)
GFR calc non Af Amer: >60 mL/min (ref >60)
Glucose, Bld: 100 mg/dL — ABNORMAL HIGH (ref 65–99)
Potassium: 4.4 mmol/L (ref 3.5–5.1)
Sodium: 137 mmol/L (ref 135–145)

## 2016-05-26 NOTE — Progress Notes (Signed)
Occupational Therapy Treatment Patient Details Name: [name] Bell MRN: [id number] DOB: 1981/04/20 Today's Date: 05/26/2016    History of present illness [name] Bell is a 35 y.o. female admitted to [facility] on 02/09/16 s/p attempted suicide: taped DNR note to chest, drank Drano, then jumped off 5 story building ([address]).S/p 02/09/16 gel foam embolization of bil internal iliac arteries.S/p 02/09/16 ex lap with cholecystectomy, resection SB and mesenteric repair, liver lac repair, application or wound vac.S/p 02/09/16 closed reduction of pelvic ring, external pelvic fixation.Also has right metacarpal fracture in splint. Pt on and off intubation via trach/ and on/off trac collar.  Pelvic x- fixator pins removed at bedside on 03/23/16.  Underwent thoracotomy 6/19 due to empyema/abcess with 2 chest tubes, one d/c 04/30/16, second d/c 05/05/16 and pt no longer has trach.  Pt with no significant PMHx listed in the chart. Pt had acute speech and left sided weakness changes on7/3/17 and both CT and MRI confirmed stroke (R frontoparietal).  Thoracotomy and evac of hematoma 05/14/16.     OT comments  Brighter affect once pt begins to interact with therapist. Pt with improved LUE strength and fine motor control and coordination. Able to elicit LLE flexion and extension with proprioceptive input through foot. Pt declined getting OOB due to stomach pain. Session at bed level. Pt states she is completing her ADL at bed level and only asks for assistance when needed. Also states she is responsible for adjusting her bed position when needed.Pt completed coloring page task for therapist, which was one of her goals. Discussed established goals and checked off goals pt had met. Pt states "when we do this I can see I'm making progress".  Discussed need to set daily goals and push herself in order to reach her goal of increasing her mobility. OT to follow up with pt 11:00 on Friday to get OOB to chair and progress mobility and  ADL. Pt's goals is to get OOB daily. Will need to coordinate treatment times with pain meds so that pain is controlled, yet pt is awake and alert and able to participate fully with therapy.   Follow Up Recommendations  CIR;Supervision/Assistance - 24 hour    Equipment Recommendations  3 in 1 bedside comode    Recommendations for Other Services Rehab consult    Precautions / Restrictions Precautions Precautions: Fall Precaution Comments: left hemiparesis Required Braces or Orthoses: Other Brace/Splint Other Brace/Splint: L PRAFO       Mobility Bed Mobility                  Transfers                 General transfer comment: Pt declined tranfer today due to comlaints of pain    Balance                                   ADL                  States she is doing all of her bathing with the exception of her buttocks. Staff is positioning her LLE in order for pt to bath herself. Discussed using a sheet to help her mobilize her LLE independently for ADL                       General ADL Comments: Discussed goals and checked off goals she has  met. Pt verbalizing how she is making improvements.       Vision                     Perception     Praxis      Cognition   Behavior During Therapy: WFL for tasks assessed/performed Overall Cognitive Status: Impaired/Different from baseline Area of Impairment: Attention   Current Attention Level: Selective Memory: Decreased short-term memory  Following Commands: Follows multi-step commands with increased time   Awareness: Emergent Problem Solving: Slow processing General Comments: improving ability to process information at quicker speeds    Extremity/Trunk Assessment     LUE strength and coordination continues to improve. Strength @ 3+/5 -4 throughout. Improved accuracy with finger to nose. Increased spontaneous use of LUE          Exercises General Exercises - Upper  Extremity Shoulder Flexion: AROM;AAROM;Both;10 reps Shoulder Extension: AAROM;Both;10 reps;Supine Shoulder ABduction: AAROM;Both;10 reps;Supine Shoulder ADduction: Left;AROM;15 reps Shoulder Horizontal ABduction: AROM;Left;10 reps Shoulder Horizontal ADduction: AROM;Left;10 reps Elbow Flexion: AROM;Left;10 reps Elbow Extension: AROM;Left;10 reps Wrist Flexion: AROM;10 reps Wrist Extension: AROM;Strengthening;Left;10 reps Other Exercises Other Exercises: PNF diagonals LUE Other Exercises: theraputty - yellow Other Exercises: BUE integrated activities - cup stacking Other Exercises: scapualr strengthening ex   Shoulder Instructions       General Comments      Pertinent Vitals/ Pain       Pain Assessment: Faces Faces Pain Scale: Hurts even more Pain Location: R chest/stomach Pain Descriptors / Indicators: Aching;Burning;Grimacing Pain Intervention(s): Limited activity within patient's tolerance;Premedicated before session  Home Living                                          Prior Functioning/Environment              Frequency Min 3X/week     Progress Toward Goals  OT Goals(current goals can now be found in the care plan section)  Progress towards OT goals: Progressing toward goals  Acute Rehab OT Goals Patient Stated Goal: to start moving more OT Goal Formulation: With patient Time For Goal Achievement: 06/08/16 Potential to Achieve Goals: Good ADL Goals Pt Will Perform Lower Body Dressing: with mod assist;bed level Pt Will Perform Toileting - Clothing Manipulation and hygiene: with min assist;sit to/from stand;sitting/lateral leans (only wear depends in pm) Additional ADL Goal #1: Pt will get OOB to chair daily with min A inpreparation for ADL Additional ADL Goal #2: Pt will complete bed mobiilty with S in preparation for ADL Additional ADL Goal #3: Pt will complete check list for goals and re-establish goals weekly with min vc  Plan  Discharge plan remains appropriate    Co-evaluation                 End of Session     Activity Tolerance Patient tolerated treatment well   Patient Left in bed;with call bell/phone within reach;with nursing/sitter in room   Nurse Communication Mobility status        Time: 1533-1600 OT Time Calculation (min): 27 min  Charges: OT General Charges $OT Visit: 1 Procedure OT Treatments $Therapeutic Activity: 8-22 mins $Neuromuscular Re-education: 8-22 mins  Cherita Hebel,HILLARY 05/26/2016, 4:52 PM   [facility], OTR/L  [phone] 05/26/2016

## 2016-05-26 NOTE — Consult Note (Addendum)
Physical Medicine and Rehabilitation Consult  Reason for Consult:   Suicide attempt with subsequent  TBI, pelvic fractures, recurrent hemopneumothorax/empyema, hemorrhagic stroke with left hemiparesis.  Referring Physician:  Dr. Grandville Silos    HPI: [name] Bell is a 35 y.o. female who was admitted on 02/09/16 after suicide attempt. She drank drano, jumped out of 5th floor hotel room and left DNR note.  She sustained multiple injuries including right rib fractures with HPTX treated with chest tube,  Pelvic fracture requiring SI pinning 4/10, multiple abdominal procedures including cholecystectomy, bowel resection and wound VAC and closure by secondary intention. Multiple left wrist fractures splinted and WBAT per Ddr. Caralyn Guile,  tracheostomy for VDRF and PEG placement of nutritional support. She developed empyema requiring thoracotomy for drainage on 06/20 by Dr. Cyndia Bent.  She tolerated extubation and decannulation without difficulty. Psychiatry following for input and medication management--for transfer to [facility] once chest tubes removed.  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 CT venogram without evidence of cortical or sagittal sinus thrombus.  She developed new onset seizure on 07/05 most likely due to hemorrhagic transformation and was loaded with Vimpat.   TEE 7/7 negative for vegetation or thrombus. She developed recurrent right hemothorax requiring right thoracotomy with drainage on 07/07.  Neurology following for input and repeat MRI brain 7/13 showed resolution of right frontal diffusion abnormality suggestive of late subacute to early chronic hemorrhage of unclear etiology. Dr. Leonie Man questioned delayed hemorrhagic contusion from fall v/s cryptogenic infarct of unknown cause and  recommends ASA once bloody pleural effusion resolves and H/H stable. CT removed on 07/17 and respiratory status stable. She is WBAT BUE/BLE. Therapy ongoing and encouraging patient on importance of getting out of bed daily.  Patient work ing on working on sitting balance at [employer] as well as pre-gait activity and weight bearing on BLE for 1-2 minutes.    Review of Systems  HENT: Negative for hearing loss.   Eyes: Negative for blurred vision and double vision.  Respiratory: Negative for cough and shortness of breath.   Cardiovascular: Negative for chest pain and palpitations.  Gastrointestinal: Negative for heartburn and nausea.  Genitourinary: Negative for dysuria and urgency.  Musculoskeletal: Positive for myalgias and back pain.  Neurological: Negative for dizziness, tingling, sensory change and headaches.      Past Medical History  Diagnosis Date  . Depression   . H/O suicide attempt     at age 22.   Marland Kitchen PTSD (post-traumatic stress disorder)   . Anxiety   . ADD (attention deficit disorder)     has been on medication  . Anemia       Past Surgical History  Procedure Laterality Date  . Esophagogastroduodenoscopy N/A 02/10/2016    Procedure: ESOPHAGOGASTRODUODENOSCOPY (EGD);  Surgeon: Doran Stabler, MD;  Location: [facility] ENDOSCOPY;  Service: Endoscopy;  Laterality: N/A;  . Laparotomy N/A 02/10/2016    Procedure: EXPLORATORY LAPAROTOMY, removal of packs,  cauterization of liver, repacking of liver, and open abdomen vac application;  Surgeon: Georganna Skeans, MD;  Location: [place];  Service: General;  Laterality: N/A;  . Laparotomy N/A 02/11/2016    Procedure: EXPLORATORY LAPAROTOMY VAC CHANGE ;  Surgeon: Judeth Horn, MD;  Location: [place];  Service: General;  Laterality: N/A;  . Chest tube insertion Right 02/11/2016  Procedure: CHEST TUBE INSERTION;  Surgeon: Judeth Horn, MD;  Location: [place];  Service: General;  Laterality: Right;  . Laparotomy N/A 02/13/2016    Procedure: EXPLORATORY  LAPAROTOMY, REMOVAL OF PACKS, ABDOMINAL VAC DRESSING CHANGE;  Surgeon: Georganna Skeans, MD;  Location: [place];  Service: General;  Laterality: N/A;  . Laparotomy N/A 02/18/2016    Procedure: EXPLORATORY LAPAROTOMY, PLACEMENT OF ABRA ABDOMINAL WALL CLOSURE SET;  Surgeon: Judeth Horn, MD;  Location: [place];  Service: General;  Laterality: N/A;  . Chest tube insertion Right 02/26/2016    Procedure: CHEST TUBE INSERTION;  Surgeon: Ivin Poot, MD;  Location: [place];  Service: Thoracic;  Laterality: Right;  . Wound debridement N/A 03/01/2016    Procedure: ABDOMINAL WOUND CLOSURE;  Surgeon: Judeth Horn, MD;  Location: [place];  Service: General;  Laterality: N/A;  . Percutaneous tracheostomy N/A 03/01/2016    Procedure: PERCUTANEOUS TRACHEOSTOMY;  Surgeon: Judeth Horn, MD;  Location: [place];  Service: General;  Laterality: N/A;  . Laparotomy N/A 02/09/2016    Procedure: EXPLORATORY LAPAROTOMY;  Surgeon: Judeth Horn, MD;  Location: [place];  Service: General;  Laterality: N/A;  . Cholecystectomy  02/09/2016    Procedure: CHOLECYSTECTOMY;  Surgeon: Judeth Horn, MD;  Location: [place];  Service: General;;  . Bowel resection  02/09/2016    Procedure: SMALL BOWEL RESECTION, MESENTERIC REPAIR;  Surgeon: Judeth Horn, MD;  Location: [place];  Service: General;;  . Application of wound vac  02/09/2016    Procedure: APPLICATION OF WOUND VAC;  Surgeon: Judeth Horn, MD;  Location: [place];  Service: General;;  . Laceration repair  02/09/2016    Procedure: REPAIR LIVER LACERATION;  Surgeon: Judeth Horn, MD;  Location: [place];  Service: General;;  . External fixation pelvis  02/09/2016    Procedure: EXTERNAL FIXATION PELVIS;  Surgeon: Altamese [place], MD;  Location: [place];  Service: Orthopedics;;  . Laparotomy N/A 02/16/2016    Procedure: EXPLORATORY LAPAROTOMY, ABDOMINAL [place];  Surgeon: Georganna Skeans, MD;  Location: [place];  Service: General;  Laterality: N/A;  . Application of wound vac N/A 02/16/2016    Procedure: RE-APPLICATION OF  WOUND VAC;  Surgeon: Georganna Skeans, MD;  Location: [place];  Service: General;  Laterality: N/A;  . Sacro-iliac pinning Right 02/16/2016    Procedure: Dub Mikes;  Surgeon: Altamese , MD;  Location: [place];  Service: Orthopedics;  Laterality: Right;  . Thoracotomy/lobectomy Right 04/26/2016    Procedure: Right THORACOTOMY AND DRAINAGE OF EMPYEMA;  Surgeon: Gaye Pollack, MD;  Location: [place];  Service: Thoracic;  Laterality: Right;  . Video assisted thoracoscopy (vats)/thorocotomy Right 05/14/2016    Procedure: RIGHT VIDEO ASSISTED THORACOSCOPY (VATS),DRAINAGE OF EMPYEMA;  Surgeon: Gaye Pollack, MD;  Location: [place];  Service: Thoracic;  Laterality: Right;  . Tee without cardioversion N/A 05/14/2016    Procedure: TRANSESOPHAGEAL ECHOCARDIOGRAM (TEE);  Surgeon: Gaye Pollack, MD;  Location: [facility] OR;  Service: Thoracic;  Laterality: N/A;  . Hematoma evacuation Right 05/14/2016    Procedure: EVACUATION HEMATOMA;  Surgeon: Gaye Pollack, MD;  Location: [facility] OR;  Service: Thoracic;  Laterality: Right;  Evacuation of Hematoma Right Chest    Family History  Problem Relation Age of Onset  . Hypertension Father   . Mental illness Mother   . Stroke Maternal Aunt   . Hypertension Sister   . Fibroids Sister      Social History:  Single. Was working for [employer] in [place]. She moved to [place]  in Feb 2017 to be closer to her sister. Was living with sister and her family--sister is self employed.  She reports that she quit smoking in 2011.  Has smoked off and  on for about for year. She does not have any smokeless tobacco history on file. Her alcohol and drug histories are not on file.    Allergies: No Known Allergies    No prescriptions prior to admission    Home: Home Living Family/patient expects to be discharged to:: Unsure Living Arrangements: Spouse/significant other Additional Comments: pt non-verbal unable to report where she lives, per chart pt recently located to [place] 3  weeks ago  Functional History: Prior Function Level of Independence: Independent Functional Status:  Mobility: Bed Mobility Overal bed mobility: Needs Assistance Bed Mobility: Supine to Sit, Sit to Sidelying, Rolling Rolling: Min assist Sidelying to sit: Mod assist, +2 for physical assistance, HOB elevated Supine to sit: Mod assist, HOB elevated Sit to supine: +2 for physical assistance, Max assist, Total assist Sit to sidelying: Mod assist General bed mobility comments: Rolling bed mobilities performed as nurse tech performed peri care mid treatment. Pt required mod assist to raise torso off bed with HOB elevated for supine to sit. She was able perform a sitting pivot to EOB with min assist to move LLE. During sit to side lying she fully controlled her torso while therapist assisted with raising her LEs onto bed Transfers Overall transfer level: Needs assistance Equipment used: Rolling walker (2 wheeled) Transfer via Lift Equipment: Stedy Transfers: Sit to/from Stand (x2) Sit to Stand: Min assist, +2 physical assistance, +2 safety/equipment Stand pivot transfers:  (used stedy for stand pivot.  ) Squat pivot transfers: Total assist, +2 physical assistance (+2 with increased assist after fatigue.  ) General transfer comment: Pt declined tranfer today due to comlaints of pain Ambulation/Gait Ambulation/Gait assistance: +2 physical assistance, Min assist Ambulation Distance (Feet): 80 Feet (with break) Assistive device: Rolling walker (2 wheeled) Gait Pattern/deviations: Step-through pattern, Shuffle ((RLE externally rotated; VC requried to stand up straight)) General Gait Details: Pt. walked down the hallway and took a seated break before continuing.  Gait velocity interpretation: Below normal speed for age/gender Wheelchair Mobility Wheelchair mobility: Yes Wheelchair propulsion: Both lower extermities Wheelchair parts: Other (comment) (did not review WC parts.  ) Distance: 300 ft  with supervision and intermittent moments of min assist.  Pt required x2 rest breaks due to fatigue.  Performed WC mobility without O2 and on RA with PMV 96%.  Pt required cues for obstacle negotiation and min-supervision for management of lines and leads.  Remains with  IV, chest tube on R and trach.   Wheelchair Assistance Details (indicate cue type and reason): Required assistance at end of trial.  Pt left sitting outside window with sitter present.    ADL: ADL Overall ADL's : Needs assistance/impaired Eating/Feeding: Set up, Bed level Eating/Feeding Details (indicate cue type and reason): Pt self fed finger foods and drank from straw with R hand, used L hand to reach inside bag for pretzels. Grooming: Set up, Cueing for UE precautions Grooming Details (indicate cue type and reason): pt up in recliner. Pt instructed to scoot hips forward in recliner to sit unsupported. Pt c/o of abdominal pain after approximately 20 second and began scooting hips back in chair Upper Body Bathing: Set up, Bed level Upper Body Bathing Details (indicate cue type and reason): EOB with increased encouragement  Lower Body Bathing: Moderate assistance, Bed level Lower Body Bathing Details (indicate cue  type and reason): bathing at bed level with use of long handled sponge. Educated pt on use of bed positionoing/use of bed controls.to help increase her independence with bathing Upper Body Dressing : Total assistance, Bed level Lower Body Dressing: Set up, Bed level Lower Body Dressing Details (indicate cue type and reason): Pt donned socks in bed with set up Toilet Transfer: Moderate assistance, +2 for physical assistance, +2 for safety/equipment, Stand-pivot, Regular Toilet, Grab bars Toilet Transfer Details (indicate cue type and reason): did not occur Toileting- Clothing Manipulation and Hygiene: Total assistance, +2 for physical assistance, Bed level Toileting - Clothing Manipulation Details (indicate cue type and  reason): aware she needed to be cleaned Functional mobility during ADLs: Minimal assistance, Rolling walker General ADL Comments: Discussed goals and checked off goals she has met. Pt verbalizing how she is making improvements.   Cognition: Cognition Overall Cognitive Status: Impaired/Different from baseline Arousal/Alertness: Lethargic Orientation Level: Oriented X4 Attention: Sustained Sustained Attention: Impaired Sustained Attention Impairment: Functional basic Problem Solving: Impaired Problem Solving Impairment: Functional basic Cognition Arousal/Alertness: Awake/alert Behavior During Therapy: WFL for tasks assessed/performed Overall Cognitive Status: Impaired/Different from baseline Area of Impairment: Attention Orientation Level: Time, Disoriented to Current Attention Level: Selective Memory: Decreased short-term memory Following Commands: Follows multi-step commands with increased time Safety/Judgement: Decreased awareness of safety, Decreased awareness of deficits Awareness: Emergent Problem Solving: Slow processing General Comments: improving ability to process information at quicker speeds Difficult to assess due to: Impaired communication  Blood pressure 130/76, pulse 70, temperature 98.5 F (36.9 C), temperature source Oral, resp. rate 18, height 5' 11"  (1.803 m), weight 81.9 kg (180 lb 8.9 oz), SpO2 100 %. Physical Exam  Nursing note and vitals reviewed. Constitutional: She is oriented to person, place, and time. She appears well-developed and well-nourished. No distress.  HENT:  Head: Normocephalic and atraumatic.  Mouth/Throat: Oropharynx is clear and moist.  Eyes: Conjunctivae are normal. Pupils are equal, round, and reactive to light.  Neck: Normal range of motion. Neck supple.  Cardiovascular: Normal rate and regular rhythm.   Murmur heard. Respiratory: Effort normal and breath sounds normal. No stridor. No respiratory distress. She has no wheezes.  GI:  Soft. Bowel sounds are normal. She exhibits no distension. There is tenderness.  Abdominal incision with dry dressing.   Musculoskeletal: She exhibits no edema or tenderness.  Neurological: She is alert and oriented to person, place, and time.  Speech clear. Able to follow basic commands without difficulty.  Left sided weakness--LE with emerging tone and tightness at knee 0/5. LUE 3+/5. Mild sensory deficits?    Skin: Skin is warm and dry. No rash noted. She is not diaphoretic. No erythema.  Psychiatric: Her speech is normal and behavior is normal. Thought content normal. Her mood appears not anxious. Cognition and memory are normal. She does not exhibit a depressed mood.  Pleasant and conversant. She made eye contact and interacted appropriately. Answered questions without difficulty.     Results for orders placed or performed during the hospital encounter of 02/09/16 (from the past 24 hour(s))  Basic metabolic panel     Status: Abnormal   Collection Time: 05/26/16  7:51 PM  Result Value Ref Range   Sodium 137 135 - 145 mmol/L   Potassium 4.4 3.5 - 5.1 mmol/L   Chloride 103 101 - 111 mmol/L   CO2 25 22 - 32 mmol/L   Glucose, Bld 100 (H) 65 - 99 mg/dL   BUN 18 6 - 20 mg/dL   Creatinine, Ser  0.90 0.44 - 1.00 mg/dL   Calcium 10.0 8.9 - 10.3 mg/dL   GFR calc non Af Amer >60 >60 mL/min   GFR calc Af Amer >60 >60 mL/min   Anion gap 9 5 - 15   Dg Chest Port 1 View  05/27/2016  CLINICAL DATA:  History of chest trauma with rib fractures. EXAM: PORTABLE CHEST 1 VIEW COMPARISON:  05/25/2016. FINDINGS: Mediastinum hilar structures stable. Heart size stable. Chest tube tract noted on the right. No pneumothorax. No focal infiltrate. Persistent right pleural thickening. Persistent displaced rib fractures. Surgical staples noted over the right chest. IMPRESSION: 1.  Stable chest status post chest tube removal.  No pneumothorax. 2. Persistent right pleural thickening without interim change. Displaced  right rib fractures again noted. Electronically Signed   By: Marcello Moores  Register   On: 05/27/2016 07:13    Assessment/Plan: Diagnosis: Polytrauma/traumatic brain injury/right MCA infarct after suicide attempt. 1. Does the need for close, 24 hr/day medical supervision in concert with the patient's rehab needs make it unreasonable for this patient to be served in a less intensive setting? Yes 2. Co-Morbidities requiring supervision/potential complications: pain mgt 3. Due to bladder management, bowel management, safety, skin/wound care, disease management, medication administration, pain management and patient education, does the patient require 24 hr/day rehab nursing? Yes 4. Does the patient require coordinated care of a physician, rehab nurse, PT (1-2 hrs/day, 5 days/week), OT (1-2 hrs/day, 5 days/week) and SLP (1-2 hrs/day, 5 days/week) to address physical and functional deficits in the context of the above medical diagnosis(es)? Yes Addressing deficits in the following areas: balance, endurance, locomotion, strength, transferring, bowel/bladder control, bathing, dressing, feeding, grooming, toileting, cognition and psychosocial support 5. Can the patient actively participate in an intensive therapy program of at least 3 hrs of therapy per day at least 5 days per week? Yes 6. The potential for patient to make measurable gains while on inpatient rehab is good 7. Anticipated functional outcomes upon discharge from inpatient rehab are modified independent and supervision  with PT, modified independent and supervision with OT, modified independent with SLP. 8. Estimated rehab length of stay to reach the above functional goals is: potentially 18-24 days 9. Does the patient have adequate social supports and living environment to accommodate these discharge functional goals? Potentially 10. Anticipated D/C setting: Home 11. Anticipated post D/C treatments: HH therapy and Outpatient therapy 12. Overall  Rehab/Functional Prognosis: good  RECOMMENDATIONS: This patient's condition is appropriate for continued rehabilitative care in the following setting: CIR Patient has agreed to participate in recommended program. Yes Note that insurance prior authorization may be required for reimbursement for recommended care.  Comment: Need to clarify the disposition (?Inpatient Psychiatric Admission) and where she needs to be functionally to move to that venue after inpatient rehab. Rehab Admissions Coordinator to follow up.  Thanks,  Meredith Staggers, MD, Mellody Drown     05/27/2016

## 2016-05-26 NOTE — Progress Notes (Signed)
Patient ID: [name] Bell, female   DOB: 1981-03-20, 35 y.o.   MRN: [id number] 12 Days Post-Op  Subjective: Feeling better, happy with the progress in recovery from her CVA  Objective: Vital signs in last 24 hours: Temp:  [98.2 F (36.8 C)-98.6 F (37 C)] 98.6 F (37 C) (07/19 0537) Pulse Rate:  [70-82] 70 (07/19 0537) Resp:  [16-18] 16 (07/19 0537) BP: (125-137)/(70-90) 137/90 mmHg (07/19 0537) SpO2:  [99 %-100 %] 99 % (07/19 0537) Weight:  [80.6 kg (177 lb 11.1 oz)] 80.6 kg (177 lb 11.1 oz) (07/19 0617) Last BM Date: 05/24/16  Intake/Output from previous day: 07/18 0701 - 07/19 0700 In: 600 [P.O.:600] Out: -  Intake/Output this shift:    General appearance: alert and cooperative Resp: clear to auscultation bilaterally and better air movement on R Cardio: regular rate and rhythm GI: soft, NT, dressing in place Neuro: moving LUE better, some movement LLE prox and distal flexion but not proximal extension  Lab Results: CBC   Recent Labs  05/24/16 0245  WBC 8.5  HGB 8.4*  HCT 27.4*  PLT 332   BMET No results for input(s): NA, K, CL, CO2, GLUCOSE, BUN, CREATININE, CALCIUM in the last 72 hours. PT/INR No results for input(s): LABPROT, INR in the last 72 hours. ABG No results for input(s): PHART, HCO3 in the last 72 hours.  Invalid input(s): PCO2, PO2  Studies/Results: Dg Chest Port 1 View  05/25/2016  CLINICAL DATA:  Chest tube removal. EXAM: PORTABLE CHEST 1 VIEW COMPARISON:  05/24/2016. FINDINGS: Interval removal of right chest tube. No pneumothorax. Mediastinum hilar structures are unremarkable. Stable cardiomegaly. Mild bibasilar subsegmental atelectasis. Persistent right pleural thickening/effusion. Multiple displaced right rib fractures again noted. Surgical staples noted over the right chest wall. IMPRESSION: 1.  Interval removal of right chest tube.  No pneumothorax. 2. Multiple displaced right rib fractures with right pleural thickening/effusion again noted.  No interim change. Electronically Signed   By: Maisie Fushomas  Register   On: 05/25/2016 07:25    Anti-infectives: Anti-infectives    Start     Dose/Rate Route Frequency Ordered Stop   05/15/16 1430  cefUROXime (ZINACEF) 1.5 g in dextrose 5 % 50 mL IVPB     1.5 g 100 mL/hr over 30 Minutes Intravenous Every 12 hours 05/15/16 1042 05/17/16 0255   05/14/16 1400  cefUROXime (ZINACEF) 1.5 g in dextrose 5 % 50 mL IVPB     1.5 g 100 mL/hr over 30 Minutes Intravenous Every 12 hours 05/14/16 1255 05/15/16 0300   05/10/16 1230  sulfamethoxazole-trimethoprim (BACTRIM DS,SEPTRA DS) 800-160 MG per tablet 1 tablet  Status:  Discontinued     1 tablet Oral Every 12 hours 05/10/16 1122 05/21/16 0742   05/07/16 1300  ciprofloxacin (CIPRO) tablet 500 mg  Status:  Discontinued     500 mg Oral 2 times daily 05/07/16 1200 05/10/16 1122   05/06/16 1700  ceFEPIme (MAXIPIME) 2 g in dextrose 5 % 50 mL IVPB  Status:  Discontinued     2 g 100 mL/hr over 30 Minutes Intravenous Every 8 hours 05/06/16 1316 05/07/16 1200   04/27/16 1000  ceFEPIme (MAXIPIME) 2 g in dextrose 5 % 50 mL IVPB  Status:  Discontinued     2 g 100 mL/hr over 30 Minutes Intravenous Every 12 hours 04/27/16 0818 05/06/16 1316   04/22/16 1200  piperacillin-tazobactam (ZOSYN) IVPB 3.375 g  Status:  Discontinued     3.375 g 12.5 mL/hr over 240 Minutes Intravenous Every 6 hours 04/22/16  1039 04/22/16 1042   04/22/16 1200  vancomycin (VANCOCIN) 1,500 mg in sodium chloride 0.9 % 500 mL IVPB  Status:  Discontinued     1,500 mg 250 mL/hr over 120 Minutes Intravenous Every 12 hours 04/22/16 1133 04/28/16 0115   04/22/16 1130  piperacillin-tazobactam (ZOSYN) IVPB 3.375 g  Status:  Discontinued     3.375 g 12.5 mL/hr over 240 Minutes Intravenous Every 8 hours 04/22/16 1043 04/27/16 0754   04/02/16 1200  fluconazole (DIFLUCAN) 40 MG/ML suspension 400 mg     400 mg Per Tube  Once 04/02/16 1026 04/02/16 1442   03/31/16 1000  sulfamethoxazole-trimethoprim  (BACTRIM,SEPTRA) 200-40 MG/5ML suspension 40 mL  Status:  Discontinued     40 mL Per Tube Every 12 hours 03/31/16 0927 03/31/16 0956   03/31/16 1000  sulfamethoxazole-trimethoprim (BACTRIM,SEPTRA) 200-40 MG/5ML suspension 20 mL     20 mL Per Tube Every 6 hours 03/31/16 0956 04/03/16 2359   03/26/16 1030  cefTRIAXone (ROCEPHIN) 2 g in dextrose 5 % 50 mL IVPB  Status:  Discontinued     2 g 100 mL/hr over 30 Minutes Intravenous Every 24 hours 03/26/16 1023 03/31/16 0921   03/25/16 1000  ceFEPIme (MAXIPIME) 2 g in dextrose 5 % 50 mL IVPB  Status:  Discontinued     2 g 100 mL/hr over 30 Minutes Intravenous Every 12 hours 03/25/16 0829 03/26/16 1023   03/08/16 1400  levofloxacin (LEVAQUIN) IVPB 750 mg     750 mg 100 mL/hr over 90 Minutes Intravenous Every 24 hours 03/08/16 0912 03/10/16 1501   03/06/16 1400  ceFEPIme (MAXIPIME) 2 g in dextrose 5 % 50 mL IVPB  Status:  Discontinued     2 g 100 mL/hr over 30 Minutes Intravenous Every 12 hours 03/06/16 1344 03/08/16 0912   03/01/16 1330  ceFAZolin (ANCEF) IVPB 2g/100 mL premix     2 g 200 mL/hr over 30 Minutes Intravenous To [facility] 02/29/16 0917 03/01/16 1452   02/11/16 0600  piperacillin-tazobactam (ZOSYN) IVPB 3.375 g  Status:  Discontinued     3.375 g 100 mL/hr over 30 Minutes Intravenous 4 times per day 02/10/16 2214 02/22/16 0853   02/10/16 1730  piperacillin-tazobactam (ZOSYN) IVPB 3.375 g  Status:  Discontinued     3.375 g 12.5 mL/hr over 240 Minutes Intravenous 3 times per day 02/10/16 1727 02/10/16 2214      Assessment/Plan: Jump from hotel/multiple toxic ingestions- Continue on Zoloft, sitter for safety.  S/P ex lap, hepatorraphy, SBR, cholecystectomy, preperitoneal pelvic packing [name] 4/3  Ex lap for acidosis/bleeding 4/4 Treazure Nery Ex lap for bleeding 4/5 [name] S/P ex fix pelvis [name] 4/3 S/P ex lap 4/7 Daily Crate S/P ex lap 4/10 Jesi Jurgens S/P sacral screws 4/10 [name] S/P application ABRA, open gastrostomy tube 4/12  [name]  S/P abd closure, trach 4/24 [name] Mult B rib FX/R HPTX, B pulm contusions  Multiple toxic ingestions Mult L wrist FXs - ok to remove splint per Dr. Melvyn Novas  Pelvic FX - S/P ex fix and angioembolization, S/P sacral screw by Dr. [name]. WBAT BLE. Right empyema s/p thoracotomy with recurrence s/p right thoracotomy, drainage - CT out CVA - R ACA and MCA distribution with L hemiparesis, no vegetation on TEE. Appreciate neurology F/U. She is gradually improving. VTE - SCD's, ASA per neuro Dispo - Eventual CIR followed by [facility] or [facility]. Will discuss with RN CM   LOS: 107 days    [name], MD, MPH, FACS Trauma: [phone] General Surgery: [phone]  05/26/2016

## 2016-05-26 NOTE — Progress Notes (Signed)
PT Cancellation Note  Patient Details Name: [name] Bell MRN: [id number] DOB: 09-19-1981   Cancelled Treatment:    Reason Eval/Treat Not Completed: Pain limiting ability to participate;Fatigue/lethargy limiting ability to participate. Pt just finished with OT and requested we come back tomorrow. She is fatigued from the pervious treatment and has a headache and stomachache.   Kallie LocksHannah Chelesa Weingartner, VirginiaPTA #[phone] 05/26/2016, 5:28 PM

## 2016-05-27 ENCOUNTER — Inpatient Hospital Stay (HOSPITAL_COMMUNITY): Payer: Medicaid Other

## 2016-05-27 ENCOUNTER — Encounter (HOSPITAL_COMMUNITY): Payer: Self-pay | Admitting: Physical Medicine and Rehabilitation

## 2016-05-27 DIAGNOSIS — S3282XS Multiple fractures of pelvis without disruption of pelvic ring, sequela: Secondary | ICD-10-CM

## 2016-05-27 DIAGNOSIS — N179 Acute kidney failure, unspecified: Secondary | ICD-10-CM

## 2016-05-27 DIAGNOSIS — T1491 Suicide attempt: Secondary | ICD-10-CM

## 2016-05-27 LAB — CBC
HCT: 29.7 % — ABNORMAL LOW (ref 36.0–46.0)
Hemoglobin: 9.1 g/dL — ABNORMAL LOW (ref 12.0–15.0)
MCH: 26.3 pg (ref 26.0–34.0)
MCHC: 30.6 g/dL (ref 30.0–36.0)
MCV: 85.8 fL (ref 78.0–100.0)
PLATELETS: 326 10*3/uL (ref 150–400)
RBC: 3.46 MIL/uL — ABNORMAL LOW (ref 3.87–5.11)
RDW: 14.9 % (ref 11.5–15.5)
WBC: 9.3 10*3/uL (ref 4.0–10.5)

## 2016-05-27 NOTE — Progress Notes (Signed)
Patient ID: [name] Bell, female   DOB: [birth year], 35 y.o.   MRN: [id number]   LOS: 108 days   Subjective: Doing well, having some more chest paint today   Objective: Vital signs in last 24 hours: Temp:  [98.5 F (36.9 C)] 98.5 F (36.9 C) (07/20 0342) Pulse Rate:  [70] 70 (07/20 0342) Resp:  [18] 18 (07/20 0342) BP: (130)/(76) 130/76 mmHg (07/20 0342) SpO2:  [100 %] 100 % (07/20 0342) Weight:  [81.9 kg (180 lb 8.9 oz)] 81.9 kg (180 lb 8.9 oz) (07/20 0342) Last BM Date: 05/25/16   Physical Exam General appearance: alert and no distress Chest wall: Incisions C/D/I, TTP, no erythema   Assessment/Plan: Jump from hotel/multiple toxic ingestions- Continue on Zoloft, sitter for safety.  S/P ex lap, hepatorraphy, SBR, cholecystectomy, preperitoneal pelvic packing [name] 4/3  Ex lap for acidosis/bleeding 4/4 Thompson Ex lap for bleeding 4/5 [name] S/P ex fix pelvis [name] 4/3 S/P ex lap 4/7 Thompson S/P ex lap 4/10 Thompson S/P sacral screws 4/10 [name] S/P application ABRA, open gastrostomy tube 4/12 [name]  S/P abd closure, trach 4/24 [name] Mult B rib FX/R HPTX, B pulm contusions  Multiple toxic ingestions Mult L wrist FXs - ok to remove splint per Dr. Melvyn Novasrtmann  Pelvic FX - S/P ex fix and angioembolization, S/P sacral screw by Dr. Carola FrostHandy. WBAT BLE. Right empyema s/p thoracotomy with recurrence s/p right thoracotomy, drainage - CT out CVA - R ACA and MCA distribution with L hemiparesis, no vegetation on TEE. Appreciate neurology F/U. She is gradually improving. VTE - SCD's, ASA per neuro Dispo - [facility]    Freeman CaldronMichael J. Brave Dack, PA-C Pager: [phone] General Trauma PA Pager: [phone]  05/27/2016

## 2016-05-27 NOTE — Progress Notes (Signed)
Nutrition Follow-up  DOCUMENTATION CODES:   Severe malnutrition in context of acute illness/injury  INTERVENTION:    Continue Regular diet   Continue multivitamin with minerals daily  NUTRITION DIAGNOSIS:   Increased nutrient needs related to  (trauma) as evidenced by estimated needs  Ongoing  GOAL:   Patient will meet greater than or equal to 90% of their needs  Met  MONITOR:   PO intake, Supplement acceptance, Labs, Weight trends, Skin, I & O's  ASSESSMENT:   Pt from [place] with no past medical hx admitted after suicide attempt by ingestion of Drano, 2 bottles of acetaminophen, nyquil, and possibly 6 alprazolam pills and jumping from 5th floor balcony of hotel. 4/3 pt is s/p exp lap, hepatorraphy, SBR, cholecystectomy, preperitoneal pelvic packing, ex fix pelvis, R HPTX, Bil pulmonary contusions.  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 S/P right thoracotomy, drainage of hemothorax, and TEE 7/7; found to have right hemothorax and incisional chest wall hematoma.  Patient remains on Regular diet. PO intake 100% per flowsheet records. OT feels patient will be ready for CIR level therapies soon.  Diet Order:  Diet regular Room service appropriate?: Yes; Fluid consistency:: Thin  Skin:  Reviewed, no issues (Incisions)  Last BM:  7/18  Height:   Ht Readings from Last 1 Encounters:  04/26/16 _0  (1.803 m)    Weight:   Wt Readings from Last 1 Encounters:  05/27/16 180 lb 8.9 oz (81.9 kg)    Ideal Body Weight:  70.4 kg  BMI:  Body mass index is 25.19 kg/(m^2).  Estimated Nutritional Needs:   Kcal:  2000-2200  Protein:  105-125 gm  Fluid:  2-2.2 L  EDUCATION NEEDS:   No education needs identified at this time  Arthur Holms, RD, LDN Pager #: [phone] After-Hours Pager #: [phone]

## 2016-05-27 NOTE — Progress Notes (Signed)
Physical Therapy Treatment Patient Details Name: [name] Bell MRN: [id number] DOB: Mar 30, 1981 Today's Date: 05/27/2016    History of Present Illness [name] Bell is a 35 y.o. female admitted to [facility] on 02/09/16 s/p attempted suicide: taped DNR note to chest, drank Drano, then jumped off 5 story building ([address]).S/p 02/09/16 gel foam embolization of bil internal iliac arteries.S/p 02/09/16 ex lap with cholecystectomy, resection SB and mesenteric repair, liver lac repair, application or wound vac.S/p 02/09/16 closed reduction of pelvic ring, external pelvic fixation.Also has right metacarpal fracture in splint. Pt on and off intubation via trach/ and on/off trac collar.  Pelvic x- fixator pins removed at bedside on 03/23/16.  Underwent thoracotomy 6/19 due to empyema/abcess with 2 chest tubes, one d/c 04/30/16, second d/c 05/05/16 and pt no longer has trach.  Pt with no significant PMHx listed in the chart. Pt had acute speech and left sided weakness changes on7/3/17 and both CT and MRI confirmed stroke (R frontoparietal).  Thoracotomy and evac of hematoma 05/14/16.      PT Comments    Pt was able to stand and pivot to the chair with facilitation at her left leg for the first time in a long time today.  She really wants to ramp up her PT so that she can prepare for the intensity of CIR level therapies.  We did talk about the plan for OT to come tomorrow at 11 am PT at 1500.  Pt also expressed the wish to get up with staff using the [facility] Standing frame (we discussed this would be safer right now than with the RW).    Follow Up Recommendations  CIR     Equipment Recommendations  Wheelchair (measurements PT);Wheelchair cushion (measurements PT);3in1 (PT);Rolling walker with 5" wheels    Recommendations for Other Services Rehab consult     Precautions / Restrictions Precautions Precautions: Fall Precaution Comments: left hemiparesis Required Braces or Orthoses: Other Brace/Splint Other  Brace/Splint: L PRAFO Restrictions RUE Weight Bearing: Weight bearing as tolerated LUE Weight Bearing: Weight bearing as tolerated RLE Weight Bearing: Weight bearing as tolerated LLE Weight Bearing: Weight bearing as tolerated    Mobility  Bed Mobility Overal bed mobility: Needs Assistance Bed Mobility: Supine to Sit     Supine to sit: Mod assist;HOB elevated     General bed mobility comments: Mod assist to help progress left leg over EOB.  Verbal cues for hand placement.  Support mostly provided at trunk as pt was using both hands on bed rail to assist in pulling to sitting EOB.    Transfers Overall transfer level: Needs assistance Equipment used: Rolling walker (2 wheeled) Transfers: Sit to/from [employer] Sit to Stand: Mod assist;+2 physical assistance Stand pivot transfers: +2 physical assistance;Mod assist       General transfer comment: Two person mod assist to assist pt in coming to standing with one person stabilizing the RW and the second person blocking only her left knee and support trunk to power up to standing EOB. Pt with both hands on RW for support during transitions.  We then were able to pivot with RW from EOB to the recliner chair with two person mod assist to support and progress left leg around and support trunk for balance as she WB over left leg to take steps with the right.  Verbal cues for step by step sequencing.       Modified Rankin (Stroke Patients Only) Modified Rankin (Stroke Patients Only) Pre-Morbid Rankin Score: No  symptoms Modified Rankin: Moderately severe disability     Balance Overall balance assessment: Needs assistance Sitting-balance support: Feet supported;Bilateral upper extremity supported Sitting balance-[name]: Fair     Standing balance support: Bilateral upper extremity supported Standing balance-[name]: Poor                      Cognition Arousal/Alertness: Awake/alert Behavior During  Therapy: WFL for tasks assessed/performed Overall Cognitive Status: Impaired/Different from baseline     Current Attention Level: Selective Memory: Decreased short-term memory Following Commands: Follows multi-step commands with increased time (and at times repetition)   Awareness: Emergent Problem Solving: Slow processing;Difficulty sequencing General Comments: At times needs repetition for more complex, multistep cues today    Exercises Total Joint Exercises Towel Squeeze: AROM;AAROM;Both;10 reps;Seated Long Arc Quad: AROM;AAROM;Right;Left;Both;10 reps General Exercises - Lower Extremity Hip Flexion/Marching: AROM;AAROM;Right;Left;10 reps;Seated Toe Raises: AROM;AAROM;Right;Left;10 reps;Seated Heel Raises: AROM;AAROM;Right;Left;10 reps;Seated        Pertinent Vitals/Pain Pain Assessment: Faces Faces Pain Scale: Hurts even more Pain Location: right side chest Pain Descriptors / Indicators: Grimacing;Guarding Pain Intervention(s): Limited activity within patient's tolerance;Monitored during session;Repositioned           PT Goals (current goals can now be found in the care plan section) Acute Rehab PT Goals Patient Stated Goal: to prepare to go to inpatient rehab.  Progress towards PT goals: Progressing toward goals    Frequency  Min 5X/week    PT Plan Current plan remains appropriate       End of Session   Activity Tolerance: Patient limited by fatigue;Patient limited by pain Patient left: in chair;with call bell/phone within reach;with nursing/sitter in room     Time: 1502-1530 PT Time Calculation (min) (ACUTE ONLY): 28 min  Charges:  $Therapeutic Exercise: 8-22 mins $Therapeutic Activity: 8-22 mins                     [name] Bell B. [name] Bell, PT, DPT [phone]   05/27/2016, 4:14 PM

## 2016-05-28 MED ORDER — LIDOCAINE-PRILOCAINE 2.5-2.5 % EX CREA
TOPICAL_CREAM | CUTANEOUS | Status: DC | PRN
  Administered 2016-05-28: 08:00:00 via TOPICAL
  Filled 2016-05-28: qty 5

## 2016-05-28 NOTE — Progress Notes (Signed)
Pt placed back in bed with assist of [facility]. Pt sat up for a total of 5 hours. Tolerated well. Pt states "I'm ready for a nap." Sitter at bed side. Will continue to monitor.

## 2016-05-28 NOTE — Progress Notes (Signed)
Physical Therapy Treatment Patient Details Name: [name] BobKatia D Bell MRN: [id number] DOB: 03/12/1981 Today's Date: 05/28/2016    History of Present Illness [name] Bell is a 35 y.o. female [facility]16 s/p attempted suicide: taped DNR note to chest, drank Drano, then jumped off 5 [address] building ([address]S/p 02/09/16 gel foam embolization of bil internal iliac arteries.S/p 02/09/16 ex lap with cholecystectomy, resection SB and mesenteric repair, liver lac repair, application or wound vac.S/p 02/09/16 closed reduction of pelvic ring, external pelvic fixation.Also has right metacarpal fracture in splint. Pt on and off intubation via trach/ and on/off trac collar.  Pelvic x- fixator pins removed at bedside on 03/23/16.  Underwent thoracotomy 6/19 due to empyema/abcess with 2 chest tubes, one d/c 04/30/16, second d/c 05/05/16 and pt no longer has trach.  Pt with no significant PMHx listed in the chart. Pt had acute speech and left sided weakness changes on7/3/17 and both CT and MRI confirmed stroke (R frontoparietal).  Thoracotomy and evac of hematoma 05/14/16.      PT Comments    Pt showing a significant effort to improve her function and participate in therapy. Her sister was present for the latter portion of treatment. Pt walked 21 feet today with +2 light mod assist. Her ability to use her LLE is improving but requires assist from therapist during gait to prevent buckling and hyperextension. She was seen after sitting up for 2 hours in the chair and requested to remain sitting up after treatment was complete. This is significant, considering in the past she typically sits in the chair for approx. 30 min each day.    Follow Up Recommendations  CIR     Equipment Recommendations  Wheelchair (measurements PT);Wheelchair cushion (measurements PT);3in1 (PT);Rolling walker with 5" wheels    Recommendations for Other Services Rehab consult     Precautions / Restrictions  Precautions Precautions: Fall Precaution Comments: left hemiparesis Required Braces or Orthoses: Other Brace/Splint Other Brace/Splint: L PRAFO Restrictions Weight Bearing Restrictions: Yes RUE Weight Bearing: Weight bearing as tolerated LUE Weight Bearing: Weight bearing as tolerated RLE Weight Bearing: Weight bearing as tolerated LLE Weight Bearing: Weight bearing as tolerated    Mobility  Bed Mobility               General bed mobility comments: Not performed. Pt in chair upon arrival and requested to remain in chair at end of treatment  Transfers Overall transfer level: Needs assistance Equipment used: Rolling walker (2 wheeled) Transfers: Sit to/from Stand (x3) Sit to Stand: Mod assist;+2 physical assistance         General transfer comment: Two person mod assist to raise pt into standing. Both therapists bracing walker and knees, with blanket under pts hips to assist in raising to standing. Pt used bil UEs on chair arm rests to power up into standing. She could not fully straighten her trunk until her hands were on the walker.  Ambulation/Gait Ambulation/Gait assistance: Mod assist; +2 physical assistance (light; third person with chair to follow)  Ambulation Distance (Feet): 21 Feet Assistive device: Rolling walker (2 wheeled) Gait Pattern/deviations: Decreased step length - right;Decreased step length - left;Step-to pattern Gait velocity: decreased  Gait velocity interpretation: Below normal speed for age/gender General Gait Details: Pt ambulated 7 ft 3x with a seated rest in between. One therapist guarded the trunk and assisted in moving the walker while the other one braced her knee to prevent buckling. Pt was able to support her torso without assist.  Step length on R is significantly shorter than the L as she does not yet trust her LLE to support her weight. Light mod assist +2 needed for gait.       Balance Overall balance assessment: Needs  assistance Sitting-balance support: Feet supported Sitting balance-[name]: Good Sitting balance - Comments: Sitting balance assessed after pt had been up in chair for two hours. In past treatments she takes a few minuets to be independent in sitting when moving to upright position from laying.   Standing balance support: Bilateral upper extremity supported Standing balance-[name]: Poor                      Cognition Arousal/Alertness: Awake/alert Behavior During Therapy: WFL for tasks assessed/performed       Current Attention Level: Selective   Following Commands: Follows one step commands consistently;Follows multi-step commands with increased time   Awareness: Emergent Problem Solving: Slow processing             Pertinent Vitals/Pain Pain Assessment: 0-10 Pain Score: 4  Pain Location: Right side where staples were removed Pain Intervention(s): Monitored during session           PT Goals (current goals can now be found in the care plan section) Progress towards PT goals: Progressing toward goals    Frequency  Min 5X/week    PT Plan Current plan remains appropriate       End of Session Equipment Utilized During Treatment: Gait belt (low on waist to avoid staple removal site) Activity Tolerance: Patient tolerated treatment well (Pt very earger and wanting to participate today) Patient left: in chair;with nursing/sitter in room;with family/visitor present;with call bell/phone within reach     Time: 1500-1535 PT Time Calculation (min) (ACUTE ONLY): 35 min  Charges:                       G CodesKallie Locks, Leda Gauze [phone]  05/28/2016, 4:08 PM

## 2016-05-28 NOTE — Progress Notes (Signed)
Patient ID: [name] Bell, female   DOB: July 21, 1981, 35 y.o.   MRN: [id number] Clinical documentation for disposition  1. Hemopneumothorax - thoracotomy staples removed 7/21. Chest tube suture removed 7/21. Chest x-ray 7/20 demonstrates good resolution without evidence of reaccumulation of pleural fluid. No further procedures should be needed at this point.  2. Oral intake - currently adequate without tube feeding supplementation. PEG tube is out.  3. Wounds - R chest wound 1cm with dry gauze dressing changed daily, midline abdominal wound with gauze dressing changed daily.  4. CVA - mild residual LUE weakness, moderate weakness LLE. Able to walk across room with walker 7/21 and progressing well with therapies.  5. Psychiatry - Dr. Neysa Hottereina Hisada saw her today in consultation and I discussed [name]'s case with her at length. She plans to have Dr. Elsie SaasJonnalagadda re-evaluate [name] next week.  [name] Jazira Maloney, MD, MPH, FACS Trauma: [phone] General Surgery: [phone]

## 2016-05-28 NOTE — Clinical Social Work Note (Addendum)
3:36pm- Psych CSW spoke with psychiatrist who reports wishes to have patient re-evaluated by Dr Shela CommonsJ who is familiar with her case prior to determining final disposition.  Medical Director updated.   2:27pm- CSW spoke with Medical Director, RNCM and CIR regarding patient's disposition and progress.  CSW placed follow up psych consult to determine need for continued inpatient psychiatric hospitalization.  Patient will be re-evaluated today.    CSW attempted to contact sister, Purnell ShoemakerKaye to provide update.  CSW left message and is currently awaiting a return call.      Vickii PennaGina Catalia Massett, LCSW [phone]  [facility] Psychiatric Service Line Licensed Clinical Social Worker

## 2016-05-28 NOTE — Progress Notes (Signed)
      [address]       [place] [id number]             [phone]      14 Days Post-Op Procedure(s) (LRB): RIGHT VIDEO ASSISTED THORACOSCOPY (VATS),DRAINAGE OF EMPYEMA (Right) TRANSESOPHAGEAL ECHOCARDIOGRAM (TEE) (N/A) EVACUATION HEMATOMA (Right)   Subjective:  No complaints.  States Dr. Lavella LemonsWyatts team said she could be anesthesized for staple removal.  Objective: Vital signs in last 24 hours: Temp:  [97.8 F (36.6 C)-98.6 F (37 C)] 98.6 F (37 C) (07/21 0643) Pulse Rate:  [73-74] 73 (07/21 0643) Cardiac Rhythm:  [-]  Resp:  [17-18] 18 (07/21 0643) BP: (130-134)/(76-89) 130/78 mmHg (07/21 0643) SpO2:  [100 %] 100 % (07/21 0643) Weight:  [180 lb 12.4 oz (82 kg)] 180 lb 12.4 oz (82 kg) (07/21 0500)  Intake/Output from previous day: 07/20 0701 - 07/21 0700 In: 240 [P.O.:240] Out: -   General appearance: alert, cooperative and no distress Heart: regular rate and rhythm Lungs: dimished breath sounds Wound: clean and dry  Lab Results:  Recent Labs  05/27/16 2014  WBC 9.3  HGB 9.1*  HCT 29.7*  PLT 326   BMET:  Recent Labs  05/26/16 1951  NA 137  K 4.4  CL 103  CO2 25  GLUCOSE 100*  BUN 18  CREATININE 0.90  CALCIUM 10.0    PT/INR: No results for input(s): LABPROT, INR in the last 72 hours. ABG    Component Value Date/Time   PHART 7.375 05/15/2016 0345   HCO3 24.3* 05/15/2016 0345   TCO2 25.6 05/15/2016 0345   ACIDBASEDEF 0.3 05/15/2016 0345   O2SAT 97.3 05/15/2016 0345   CBG (last 3)  No results for input(s): GLUCAP in the last 72 hours.  Assessment/Plan: S/P Procedure(s) (LRB): RIGHT VIDEO ASSISTED THORACOSCOPY (VATS),DRAINAGE OF EMPYEMA (Right) TRANSESOPHAGEAL ECHOCARDIOGRAM (TEE) (N/A) EVACUATION HEMATOMA (Right)  1. Wounds are healing well... Can remove staples and chest tube suture today 2. Ordered Emla cream to be applied before staple removal 3. Dispo- care per trauma   LOS: 109 days    [name],  Selena Swaminathan 05/28/2016

## 2016-05-28 NOTE — Progress Notes (Signed)
CT sutures and staples removed per order and per protocol. Steri-strips applied to CT sites. Pt tolerated procedure well. Sitter at bedside. No further complaints, will continue to monitor.

## 2016-05-28 NOTE — Progress Notes (Signed)
I met with pt at bedside to discuss inpt rehab venue and goals. Pt asked very appropriate questions concerning timing of admission and typical therapy day. I clarified with pt her ultimate dispo goal. She states she plans to go home with her sister and her nieces who she feels can provide her needed 24/7 care needs. I asked if anyone had discussed the need for an inpt psych/behavioral facility admission after inpt rehab admission and she denied any awareness. States she feels there is no need for a sitter at this time and no need for an inpt psych admission. I discussed goals for the next few days would be for her to increase her tolerance to sit in chair from her now 20 mins to an hour at a time, I would clarify goals with sitter and psych evaluation with psych SW and I would clarify with inpt rehab team appropriate timing of admission. Pt gave me permission to contact her sister to discuss. [phone]
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
## 2016-05-28 NOTE — Progress Notes (Signed)
Patient ID: [name] Bell, female   DOB: 07/14/1981, 35 y.o.   MRN: [id number] 14 Days Post-Op  Subjective: Good spirits, was up to chair for a bit yesterday. She spoke with the CIR admission coordinator this AM to discuss the program here further.  Objective: Vital signs in last 24 hours: Temp:  [97.8 F (36.6 C)-98.6 F (37 C)] 98.6 F (37 C) (07/21 0643) Pulse Rate:  [73-74] 73 (07/21 0643) Resp:  [17-18] 18 (07/21 0643) BP: (130-134)/(76-89) 130/78 mmHg (07/21 0643) SpO2:  [100 %] 100 % (07/21 0643) Weight:  [82 kg (180 lb 12.4 oz)] 82 kg (180 lb 12.4 oz) (07/21 0500) Last BM Date: 05/25/16  Intake/Output from previous day: 07/20 0701 - 07/21 0700 In: 240 [P.O.:240] Out: -  Intake/Output this shift:    General appearance: alert and cooperative Resp: clear to auscultation bilaterally Chest wall: staples and CT suture just removed Cardio: regular rate and rhythm GI: soft, NT, wound healing Neuro: A&O, LUE moving well with 4/5 str. LLE still weak, some reflexive plantar flexion to stim  Lab Results: CBC   Recent Labs  05/27/16 2014  WBC 9.3  HGB 9.1*  HCT 29.7*  PLT 326   BMET  Recent Labs  05/26/16 1951  NA 137  K 4.4  CL 103  CO2 25  GLUCOSE 100*  BUN 18  CREATININE 0.90  CALCIUM 10.0   Assessment/Plan: Jump from hotel/multiple toxic ingestions- Continue on Zoloft, sitter for safety.  S/P ex lap, hepatorraphy, SBR, cholecystectomy, preperitoneal pelvic packing [name] 4/3  Ex lap for acidosis/bleeding 4/4 Latrelle Bazar Ex lap for bleeding 4/5 [name] S/P ex fix pelvis [name] 4/3 S/P ex lap 4/7 India Jolin S/P ex lap 4/10 Laneka Mcgrory S/P sacral screws 4/10 [name] S/P application ABRA, open gastrostomy tube 4/12 [name]  S/P abd closure, trach 4/24 [name] Mult B rib FX/R HPTX, B pulm contusions  Multiple toxic ingestions Mult L wrist FXs - ok to remove splint per Dr. Melvyn Novasrtmann  Pelvic FX - S/P ex fix and angioembolization, S/P sacral screw by Dr. Carola FrostHandy. WBAT  BLE. Right empyema s/p thoracotomy with recurrence s/p right thoracotomy, drainage - staples out today CVA - R ACA and MCA distribution with L hemiparesis, no vegetation on TEE. Appreciate neurology F/U. She is gradually improving. VTE - SCD's, ASA per neuro Dispo - Eventual CIR followed by possible [facility] or [facility]. Appreciate F/U today by CIR admissions coordinator.  LOS: 109 days    [name] Azyriah Nevins, MD, MPH, FACS Trauma: [phone] General Surgery: [phone]  05/28/2016

## 2016-05-28 NOTE — Progress Notes (Signed)
Occupational Therapy Treatment Patient Details Name: Phylliss BobKatia D Ottaway MRN: [id number] DOB: Jun 26, 1981 Today's Date: 05/28/2016    History of present illness Phylliss BobKatia D Pantoja is a 35 y.o. female [facility] s/p attempted suicide: taped DNR note to chest, drank Drano, then jumped [place] 5 story building ([address]S/p 02/09/16 gel foam embolization of bil internal iliac arteries.S/p 02/09/16 ex lap with cholecystectomy, resection SB and mesenteric repair, liver lac repair, application or wound vac.S/p 02/09/16 closed reduction of pelvic ring, external pelvic fixation.Also has right metacarpal fracture in splint. Pt on and off intubation via trach/ and on/off trac collar.  Pelvic x- fixator pins removed at bedside on 03/23/16.  Underwent thoracotomy 6/19 due to empyema/abcess with 2 chest tubes, one d/c 04/30/16, second d/c 05/05/16 and pt no longer has trach.  Pt with no significant PMHx listed in the chart. Pt had acute speech and left sided weakness changes on7/3/17 and both CT and MRI confirmed stroke (R frontoparietal).  Thoracotomy and evac of hematoma 05/14/16.     OT comments  Excellent session today. Bright affect with pt laughing and joking with therapist. Transferred to recliner with mod A after donning her socks by herself. After receiving clearance from nurse, traveled to solarium to eat lunch with therapist and spent time taking selfies with hats/glasses (from fun festival) - laughing and joking with therapist. Pt discussed her family dynamics and how she feels that today was her "best day yet" and how much she appreciated everything everyone has done for her. Pt making eye contact with visitors in hospital and initiating conversation at times.  Pt asking to stay in the solarium for longer time. Discussed goals and how much progress she has made. Pt acknowledged her need to continue to push herself to meet her goal of going to rehab.  Feel pt is excellent CIR candidate and will be able  to tolerate 3 hrs/day.  Will continue to follow acutely.   Follow Up Recommendations  CIR;Supervision/Assistance - 24 hour    Equipment Recommendations  3 in 1 bedside comode    Recommendations for Other Services Rehab consult    Precautions / Restrictions Precautions Precautions: Fall Precaution Comments: left hemiparesis Required Braces or Orthoses: Other Brace/Splint Other Brace/Splint: L PRAFO Restrictions Weight Bearing Restrictions: Yes RUE Weight Bearing: Weight bearing as tolerated LUE Weight Bearing: Weight bearing as tolerated RLE Weight Bearing: Weight bearing as tolerated LLE Weight Bearing: Weight bearing as tolerated       Mobility Bed Mobility Overal bed mobility: Needs Assistance Bed Mobility: Supine to Sit   Sidelying to sit: Mod assist       General bed mobility comments: Not performed. Pt in chair upon arrival and requested to remain in chair at end of treatment  Transfers Overall transfer level: Needs assistance Equipment used: 1 person hand held assist Transfers: Sit to/from Stand;Stand Pivot Transfers Sit to Stand: Min assist;From elevated surface (L knee blocked) Stand pivot transfers: Mod assist (toward R side)       General transfer comment: Two person mod assist to raise pt into standing. Both therapists bracing walker and knees, with blanket under pts hips to assist in raising to standing. Pt used bil UEs on chair arm rests to power up into standing. She could not fully straighten her trunk until her hands were on the walker.    Balance Overall balance assessment: Needs assistance Sitting-balance support: Feet supported Sitting balance-[name]: Fair Sitting balance - Comments: lateral lean   Standing  balance support: Bilateral upper extremity supported Standing balance-[name]: Good                     ADL Overall ADL's : Needs assistance/impaired                     Lower Body Dressing: Minimal  assistance Lower Body Dressing Details (indicate cue type and reason): min a with donning socks bed level             Functional mobility during ADLs: Moderate assistance (stand pivot) General ADL Comments: Focus of session transferring to chair and going to solarium . Transferred to chair with mod A stand pivot to R. Once in chair, pt visited solarium with therapist after receiving clearnace form nursing. Pt laughing and joking and initiating interaction with visitors in hospital. In solarium, pt taking selfies with hats/glasses. discussed her family and some of the dynamics in her family. Once returned to room, made chart for time OOB. Again discussed goals and established new goals.       Vision                     Perception     Praxis      Cognition   Behavior During Therapy: WFL for tasks assessed/performed Overall Cognitive Status: Impaired/Different from baseline Area of Impairment: Attention;Memory   Current Attention Level: Selective Memory: Decreased short-term memory  Following Commands: Follows one step commands consistently;Follows multi-step commands with increased time   Awareness: Emergent Problem Solving: Slow processing      Extremity/Trunk Assessment               Exercises Other Exercises Other Exercises: shoulder rolls Other Exercises: BUE reaching out of base of support   Shoulder Instructions       General Comments      Pertinent Vitals/ Pain       Pain Assessment: Faces Pain Score: 4  Faces Pain Scale: Hurts little more Pain Location: R chest Pain Descriptors / Indicators: Aching;Discomfort Pain Intervention(s): Limited activity within patient's tolerance  Home Living                                          Prior Functioning/Environment              Frequency Min 3X/week     Progress Toward Goals  OT Goals(current goals can now be found in the care plan section)  Progress towards OT goals:  Progressing toward goals  Acute Rehab OT Goals Patient Stated Goal: to prepare to go to inpatient rehab.  OT Goal Formulation: With patient Time For Goal Achievement: 06/08/16 Potential to Achieve Goals: Good ADL Goals Pt Will Perform Lower Body Bathing: with mod assist;sitting/lateral leans Pt Will Perform Lower Body Dressing: with mod assist;bed level Pt Will Transfer to Toilet: with +2 assist;with min assist;stand pivot transfer Pt Will Perform Toileting - Clothing Manipulation and hygiene: with min assist;sit to/from stand;sitting/lateral leans Pt/caregiver will Perform Home Exercise Program: Increased strength;Left upper extremity;With minimal assist Additional ADL Goal #1: Pt will get OOB to chair daily with min A inpreparation for ADL Additional ADL Goal #2: Pt will complete bed mobiilty with S in preparation for ADL Additional ADL Goal #3: Pt will complete check list for goals and re-establish goals weekly with min vc Additional ADL Goal #4: Complete bed  mobility with S for ADL  Plan Discharge plan remains appropriate    Co-evaluation                 End of Session Equipment Utilized During Treatment: Gait belt   Activity Tolerance Patient tolerated treatment well   Patient Left in chair;with call bell/phone within reach;with nursing/sitter in room   Nurse Communication Mobility status        Time: 1200-1250 OT Time Calculation (min): 50 min  Charges: OT General Charges $OT Visit: 1 Procedure OT Treatments $Self Care/Home Management : 8-22 mins $Therapeutic Activity: 23-37 mins  Gustavus Haskin,HILLARY 05/28/2016, 4:23 PM   [facility], OTR/L  [phone] 05/28/2016

## 2016-05-29 MED ORDER — LACOSAMIDE 50 MG PO TABS
100.0000 mg | ORAL_TABLET | Freq: Two times a day (BID) | ORAL | Status: DC
  Administered 2016-05-29 – 2016-06-03 (×11): 100 mg via ORAL
  Filled 2016-05-29 (×11): qty 2

## 2016-05-29 NOTE — Progress Notes (Signed)
Physical Therapy Treatment Patient Details Name: [name] Bell MRN: [id number] DOB: [birth year] Today's Date: 05/29/2016    History of Present Illness [name] Bell is a 35 y.o. female admitted to [facility] on 02/09/16 s/p attempted suicide: taped DNR note to chest, drank Drano, then jumped off 5 story building ([address]).S/p 02/09/16 gel foam embolization of bil internal iliac arteries.S/p 02/09/16 ex lap with cholecystectomy, resection SB and mesenteric repair, liver lac repair, application or wound vac.S/p 02/09/16 closed reduction of pelvic ring, external pelvic fixation.Also has right metacarpal fracture in splint. Pt on and off intubation via trach/ and on/off trac collar.  Pelvic x- fixator pins removed at bedside on 03/23/16.  Underwent thoracotomy 6/19 due to empyema/abcess with 2 chest tubes, one d/c 04/30/16, second d/c 05/05/16 and pt no longer has trach.  Pt with no significant PMHx listed in the chart. Pt had acute speech and left sided weakness changes on7/3/17 and both CT and MRI confirmed stroke (R frontoparietal).  Thoracotomy and evac of hematoma 05/14/16.      PT Comments    Called by nursing staff due to pt in recliner and difficulty getting out. Pt refusing to attempt further with nursing. [facility] and pt cooperative. I built up the recliner with pillows on top of blankets to incr the height of the recliner to make future transfers out of the chair easier.  Follow Up Recommendations  CIR     Equipment Recommendations  Wheelchair (measurements PT);Wheelchair cushion (measurements PT);3in1 (PT);Rolling walker with 5" wheels    Recommendations for Other Services Rehab consult     Precautions / Restrictions Precautions Precautions: Fall Precaution Comments: left hemiparesis Required Braces or Orthoses: Other Brace/Splint Other Brace/Splint: L PRAFO Restrictions Weight Bearing Restrictions: Yes RUE Weight Bearing: Weight bearing as  tolerated LUE Weight Bearing: Weight bearing as tolerated RLE Weight Bearing: Weight bearing as tolerated LLE Weight Bearing: Weight bearing as tolerated    Mobility  Bed Mobility Overal bed mobility: Needs Assistance Bed Mobility: Sit to Supine       Sit to supine: +2 for physical assistance;Mod assist   General bed mobility comments: Assist to guide trunk and to bring legs up into bed  Transfers Overall transfer level: Needs assistance Equipment used: Ambulation equipment used Transfers: Sit to/from Stand;Stand Pivot Transfers Sit to Stand: +2 physical assistance;Mod assist Stand pivot transfers:  [name] Bell)       General transfer comment: +2 mod A to bring hips and trunk up from low recliner. Pt stood on Stedy x 1 minute with min guard. Able to come to stand from the semi standing position on the Stedy seat with min guard assist. Pt with left foot tending to slide laterally into lateral barrier.   Ambulation/Gait                 Stairs            Wheelchair Mobility    Modified Rankin (Stroke Patients Only)       Balance Overall balance assessment: Needs assistance Sitting-balance support: Feet supported Sitting balance-[name]: Good     Standing balance support: Bilateral upper extremity supported Standing balance-[name]: Poor                      Cognition Arousal/Alertness: Awake/alert Behavior During Therapy: WFL for tasks assessed/performed       Current Attention Level: Selective   Following Commands: Follows one step commands  consistently;Follows multi-step commands with increased time   Awareness: Emergent Problem Solving: Slow processing      Exercises      General Comments        Pertinent Vitals/Pain      Home Living                      Prior Function            PT Goals (current goals can now be found in the care plan section) Progress towards PT goals: Progressing toward goals     Frequency  Min 5X/week    PT Plan Current plan remains appropriate    Co-evaluation             End of Session Equipment Utilized During Treatment: Gait belt Activity Tolerance: Patient tolerated treatment well Patient left: with nursing/sitter in room;with call bell/phone within reach;in bed     Time: 9147-8295 PT Time Calculation (min) (ACUTE ONLY): 15 min  Charges:  $Therapeutic Activity: 8-22 mins                    G Codes:      [name] Bell 06/07/16, 5:11 PM [facility] PT [phone]

## 2016-05-29 NOTE — Progress Notes (Signed)
15 Days Post-Op  Subjective: NAE tol PO  Objective: Vital signs in last 24 hours: Temp:  [97.6 F (36.4 C)-98.7 F (37.1 C)] 98.7 F (37.1 C) (07/22 0542) Pulse Rate:  [70-79] 73 (07/22 0542) Resp:  [16-19] 16 (07/22 0542) BP: (114-127)/(69-80) 127/69 mmHg (07/22 0542) SpO2:  [100 %] 100 % (07/22 0542) Weight:  [83.6 kg (184 lb 4.9 oz)] 83.6 kg (184 lb 4.9 oz) (07/22 0542) Last BM Date: 05/27/16  Intake/Output from previous day: 07/21 0701 - 07/22 0700 In: 600 [P.O.:600] Out: -  Intake/Output this shift:    General appearance: alert and cooperative Cardio: regular rate and rhythm, S1, S2 normal, no murmur, click, rub or gallop GI: soft, non-tender; bowel sounds normal; no masses,  no organomegaly  Lab Results:   Recent Labs  05/27/16 2014  WBC 9.3  HGB 9.1*  HCT 29.7*  PLT 326   BMET  Recent Labs  05/26/16 1951  NA 137  K 4.4  CL 103  CO2 25  GLUCOSE 100*  BUN 18  CREATININE 0.90  CALCIUM 10.0   PT/INR No results for input(s): LABPROT, INR in the last 72 hours. ABG No results for input(s): PHART, HCO3 in the last 72 hours.  Invalid input(s): PCO2, PO2  Studies/Results: Dg Chest 2 View  05/27/2016  CLINICAL DATA:  Followup traumatic right rib fractures. EXAM: CHEST  2 VIEW COMPARISON:  Earlier today. FINDINGS: Multiple displaced right rib fractures are again demonstrated. There is no significant change in amount of right pleural fluid. This contains an air-fluid level posteriorly, not previously evident without a lateral view and with the patient likely more recumbent previously. Normal sized heart. Clear lungs. Mild scoliosis. IMPRESSION: Grossly stable right hydropneumothorax and right rib fractures. Electronically Signed   By: Beckie Salts M.D.   On: 05/27/2016 16:50    Anti-infectives: Anti-infectives    Start     Dose/Rate Route Frequency Ordered Stop   05/15/16 1430  cefUROXime (ZINACEF) 1.5 g in dextrose 5 % 50 mL IVPB     1.5 g 100 mL/hr  over 30 Minutes Intravenous Every 12 hours 05/15/16 1042 05/17/16 0255   05/14/16 1400  cefUROXime (ZINACEF) 1.5 g in dextrose 5 % 50 mL IVPB     1.5 g 100 mL/hr over 30 Minutes Intravenous Every 12 hours 05/14/16 1255 05/15/16 0300   05/10/16 1230  sulfamethoxazole-trimethoprim (BACTRIM DS,SEPTRA DS) 800-160 MG per tablet 1 tablet  Status:  Discontinued     1 tablet Oral Every 12 hours 05/10/16 1122 05/21/16 0742   05/07/16 1300  ciprofloxacin (CIPRO) tablet 500 mg  Status:  Discontinued     500 mg Oral 2 times daily 05/07/16 1200 05/10/16 1122   05/06/16 1700  ceFEPIme (MAXIPIME) 2 g in dextrose 5 % 50 mL IVPB  Status:  Discontinued     2 g 100 mL/hr over 30 Minutes Intravenous Every 8 hours 05/06/16 1316 05/07/16 1200   04/27/16 1000  ceFEPIme (MAXIPIME) 2 g in dextrose 5 % 50 mL IVPB  Status:  Discontinued     2 g 100 mL/hr over 30 Minutes Intravenous Every 12 hours 04/27/16 0818 05/06/16 1316   04/22/16 1200  piperacillin-tazobactam (ZOSYN) IVPB 3.375 g  Status:  Discontinued     3.375 g 12.5 mL/hr over 240 Minutes Intravenous Every 6 hours 04/22/16 1039 04/22/16 1042   04/22/16 1200  vancomycin (VANCOCIN) 1,500 mg in sodium chloride 0.9 % 500 mL IVPB  Status:  Discontinued     1,500  mg 250 mL/hr over 120 Minutes Intravenous Every 12 hours 04/22/16 1133 04/28/16 0115   04/22/16 1130  piperacillin-tazobactam (ZOSYN) IVPB 3.375 g  Status:  Discontinued     3.375 g 12.5 mL/hr over 240 Minutes Intravenous Every 8 hours 04/22/16 1043 04/27/16 0754   04/02/16 1200  fluconazole (DIFLUCAN) 40 MG/ML suspension 400 mg     400 mg Per Tube  Once 04/02/16 1026 04/02/16 1442   03/31/16 1000  sulfamethoxazole-trimethoprim (BACTRIM,SEPTRA) 200-40 MG/5ML suspension 40 mL  Status:  Discontinued     40 mL Per Tube Every 12 hours 03/31/16 0927 03/31/16 0956   03/31/16 1000  sulfamethoxazole-trimethoprim (BACTRIM,SEPTRA) 200-40 MG/5ML suspension 20 mL     20 mL Per Tube Every 6 hours 03/31/16 0956  04/03/16 2359   03/26/16 1030  cefTRIAXone (ROCEPHIN) 2 g in dextrose 5 % 50 mL IVPB  Status:  Discontinued     2 g 100 mL/hr over 30 Minutes Intravenous Every 24 hours 03/26/16 1023 03/31/16 0921   03/25/16 1000  ceFEPIme (MAXIPIME) 2 g in dextrose 5 % 50 mL IVPB  Status:  Discontinued     2 g 100 mL/hr over 30 Minutes Intravenous Every 12 hours 03/25/16 0829 03/26/16 1023   03/08/16 1400  levofloxacin (LEVAQUIN) IVPB 750 mg     750 mg 100 mL/hr over 90 Minutes Intravenous Every 24 hours 03/08/16 0912 03/10/16 1501   03/06/16 1400  ceFEPIme (MAXIPIME) 2 g in dextrose 5 % 50 mL IVPB  Status:  Discontinued     2 g 100 mL/hr over 30 Minutes Intravenous Every 12 hours 03/06/16 1344 03/08/16 0912   03/01/16 1330  ceFAZolin (ANCEF) IVPB 2g/100 mL premix     2 g 200 mL/hr over 30 Minutes Intravenous To [facility] 02/29/16 0917 03/01/16 1452   02/11/16 0600  piperacillin-tazobactam (ZOSYN) IVPB 3.375 g  Status:  Discontinued     3.375 g 100 mL/hr over 30 Minutes Intravenous 4 times per day 02/10/16 2214 02/22/16 0853   02/10/16 1730  piperacillin-tazobactam (ZOSYN) IVPB 3.375 g  Status:  Discontinued     3.375 g 12.5 mL/hr over 240 Minutes Intravenous 3 times per day 02/10/16 1727 02/10/16 2214      Assessment/Plan: Jump from hotel/multiple toxic ingestions- Continue on Zoloft, sitter for safety.  S/P ex lap, hepatorraphy, SBR, cholecystectomy, preperitoneal pelvic packing [name] 4/3  Ex lap for acidosis/bleeding 4/4 Thompson Ex lap for bleeding 4/5 [name] S/P ex fix pelvis [name] 4/3 S/P ex lap 4/7 Thompson S/P ex lap 4/10 Thompson S/P sacral screws 4/10 [name] S/P application ABRA, open gastrostomy tube 4/12 [name]  S/P abd closure, trach 4/24 [name] Mult B rib FX/R HPTX, B pulm contusions  Multiple toxic ingestions Mult L wrist FXs - ok to remove splint per Dr. Melvyn Novas  Pelvic FX - S/P ex fix and angioembolization, S/P sacral screw by Dr. [name]. WBAT BLE. 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 Appreciate neurology F/U. She is gradually improving. VTE - SCD's, ASA per neuro Dispo -pending- Eventual CIR followed by possible [facility] or [facility]. Appreciate F/U today by CIR admissions coordinator.  LOS: 110 days    [name] Bell., Jed Limerick 05/29/2016

## 2016-05-30 NOTE — Progress Notes (Signed)
Patient ID: [name] Bell, female   DOB: Jan 12, 1981, 35 y.o.   MRN: [id number] 16 Days Post-Op  Subjective: Sat p in chair again yesterday, no SOB  Objective: Vital signs in last 24 hours: Temp:  [98.4 F (36.9 C)-98.6 F (37 C)] 98.6 F (37 C) (07/23 0629) Pulse Rate:  [73-79] 73 (07/23 0629) Resp:  [16-19] 19 (07/23 0629) BP: (116-127)/(66-79) 127/79 (07/23 0629) SpO2:  [100 %] 100 % (07/23 0629) Last BM Date: 05/27/16  Intake/Output from previous day: No intake/output data recorded. Intake/Output this shift: No intake/output data recorded.  General appearance: cooperative Resp: clear to auscultation bilaterally Cardio: regular rate and rhythm GI: soft, NT, wound clean granulation Neuro: same as my last exam Lab Results: CBC   Recent Labs  05/27/16 2014  WBC 9.3  HGB 9.1*  HCT 29.7*  PLT 326   BMET No results for input(s): NA, K, CL, CO2, GLUCOSE, BUN, CREATININE, CALCIUM in the last 72 hours. PT/INR No results for input(s): LABPROT, INR in the last 72 hours. ABG No results for input(s): PHART, HCO3 in the last 72 hours.  Invalid input(s): PCO2, PO2  Studies/Results: No results found.  Anti-infectives: Anti-infectives    Start     Dose/Rate Route Frequency Ordered Stop   05/15/16 1430  cefUROXime (ZINACEF) 1.5 g in dextrose 5 % 50 mL IVPB     1.5 g 100 mL/hr over 30 Minutes Intravenous Every 12 hours 05/15/16 1042 05/17/16 0255   05/14/16 1400  cefUROXime (ZINACEF) 1.5 g in dextrose 5 % 50 mL IVPB     1.5 g 100 mL/hr over 30 Minutes Intravenous Every 12 hours 05/14/16 1255 05/15/16 0300   05/10/16 1230  sulfamethoxazole-trimethoprim (BACTRIM DS,SEPTRA DS) 800-160 MG per tablet 1 tablet  Status:  Discontinued     1 tablet Oral Every 12 hours 05/10/16 1122 05/21/16 0742   05/07/16 1300  ciprofloxacin (CIPRO) tablet 500 mg  Status:  Discontinued     500 mg Oral 2 times daily 05/07/16 1200 05/10/16 1122   05/06/16 1700  ceFEPIme (MAXIPIME) 2 g in dextrose  5 % 50 mL IVPB  Status:  Discontinued     2 g 100 mL/hr over 30 Minutes Intravenous Every 8 hours 05/06/16 1316 05/07/16 1200   04/27/16 1000  ceFEPIme (MAXIPIME) 2 g in dextrose 5 % 50 mL IVPB  Status:  Discontinued     2 g 100 mL/hr over 30 Minutes Intravenous Every 12 hours 04/27/16 0818 05/06/16 1316   04/22/16 1200  piperacillin-tazobactam (ZOSYN) IVPB 3.375 g  Status:  Discontinued     3.375 g 12.5 mL/hr over 240 Minutes Intravenous Every 6 hours 04/22/16 1039 04/22/16 1042   04/22/16 1200  vancomycin (VANCOCIN) 1,500 mg in sodium chloride 0.9 % 500 mL IVPB  Status:  Discontinued     1,500 mg 250 mL/hr over 120 Minutes Intravenous Every 12 hours 04/22/16 1133 04/28/16 0115   04/22/16 1130  piperacillin-tazobactam (ZOSYN) IVPB 3.375 g  Status:  Discontinued     3.375 g 12.5 mL/hr over 240 Minutes Intravenous Every 8 hours 04/22/16 1043 04/27/16 0754   04/02/16 1200  fluconazole (DIFLUCAN) 40 MG/ML suspension 400 mg     400 mg Per Tube  Once 04/02/16 1026 04/02/16 1442   03/31/16 1000  sulfamethoxazole-trimethoprim (BACTRIM,SEPTRA) 200-40 MG/5ML suspension 40 mL  Status:  Discontinued     40 mL Per Tube Every 12 hours 03/31/16 0927 03/31/16 0956   03/31/16 1000  sulfamethoxazole-trimethoprim (BACTRIM,SEPTRA) 200-40 MG/5ML suspension 20  mL     20 mL Per Tube Every 6 hours 03/31/16 0956 04/03/16 2359   03/26/16 1030  cefTRIAXone (ROCEPHIN) 2 g in dextrose 5 % 50 mL IVPB  Status:  Discontinued     2 g 100 mL/hr over 30 Minutes Intravenous Every 24 hours 03/26/16 1023 03/31/16 0921   03/25/16 1000  ceFEPIme (MAXIPIME) 2 g in dextrose 5 % 50 mL IVPB  Status:  Discontinued     2 g 100 mL/hr over 30 Minutes Intravenous Every 12 hours 03/25/16 0829 03/26/16 1023   03/08/16 1400  levofloxacin (LEVAQUIN) IVPB 750 mg     750 mg 100 mL/hr over 90 Minutes Intravenous Every 24 hours 03/08/16 0912 03/10/16 1501   03/06/16 1400  ceFEPIme (MAXIPIME) 2 g in dextrose 5 % 50 mL IVPB  Status:   Discontinued     2 g 100 mL/hr over 30 Minutes Intravenous Every 12 hours 03/06/16 1344 03/08/16 0912   03/01/16 1330  ceFAZolin (ANCEF) IVPB 2g/100 mL premix     2 g 200 mL/hr over 30 Minutes Intravenous To [facility] 02/29/16 0917 03/01/16 1452   02/11/16 0600  piperacillin-tazobactam (ZOSYN) IVPB 3.375 g  Status:  Discontinued     3.375 g 100 mL/hr over 30 Minutes Intravenous 4 times per day 02/10/16 2214 02/22/16 0853   02/10/16 1730  piperacillin-tazobactam (ZOSYN) IVPB 3.375 g  Status:  Discontinued     3.375 g 12.5 mL/hr over 240 Minutes Intravenous 3 times per day 02/10/16 1727 02/10/16 2214      Assessment/Plan: Jump from hotel/multiple toxic ingestions- Continue on Zoloft, sitter for safety.  S/P ex lap, hepatorraphy, SBR, cholecystectomy, preperitoneal pelvic packing [name] Bell 4/3  Ex lap for acidosis/bleeding 4/4 [name] Bell Ex lap for bleeding 4/5 [name] Bell S/P ex fix pelvis [name] Bell 4/3 S/P ex lap 4/7 [name] Bell S/P ex lap 4/10 [name] Bell S/P sacral screws 4/10 [name] Bell S/P application ABRA, open gastrostomy tube 4/12 [name] Bell  S/P abd closure, trach 4/24 [name] Bell Mult B rib FX/R HPTX, B pulm contusions  Multiple toxic ingestions Mult L wrist FXs - ok to remove splint per Dr. Melvyn Bell  Pelvic FX - S/P ex fix and angioembolization, S/P sacral screw by Dr. Carola Bell. WBAT BLE. 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 Appreciate neurology F/U. Gradually improving. VTE - SCD's, ASA per neuro Dispo -pending- Eventual CIR followed by possible [facility] or [facility]. Psych to see again Monday   LOS: 111 days    [name] Gelinas, MD, MPH, FACS Trauma: [id number] General Surgery: [phone]  05/30/2016

## 2016-05-30 NOTE — Progress Notes (Signed)
Offered to do right chest dressing change. Pt stated that she did not want dressing change done at this time. Will pass this on to oncoming nurse.   Berdine Dance BSN, RN
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
## 2016-05-31 NOTE — Progress Notes (Signed)
Physical Therapy Treatment Patient Details Name: [name] Bell MRN: [id number] DOB: [birth year] Today's Date: 05/31/2016    History of Present Illness [name] Bell is a 35 y.o. female admitted to [facility] on 02/09/16 s/p attempted suicide: taped DNR note to chest, drank Drano, then jumped off 5 story building ([address]).S/p 02/09/16 gel foam embolization of bil internal iliac arteries.S/p 02/09/16 ex lap with cholecystectomy, resection SB and mesenteric repair, liver lac repair, application or wound vac.S/p 02/09/16 closed reduction of pelvic ring, external pelvic fixation.Also has right metacarpal fracture in splint. Pt on and off intubation via trach/ and on/off trac collar.  Pelvic x- fixator pins removed at bedside on 03/23/16.  Underwent thoracotomy 6/19 due to empyema/abcess with 2 chest tubes, one d/c 04/30/16, second d/c 05/05/16 and pt no longer has trach.  Pt with no significant PMHx listed in the chart. Pt had acute speech and left sided weakness changes on7/3/17 and both CT and MRI confirmed stroke (R frontoparietal).  Thoracotomy and evac of hematoma 05/14/16.      PT Comments    Pt admitted with above diagnosis. Pt currently with functional limitations due to balance and endurance deficits. Nurse Katrina came to this PT and asked if PT could  Assist pt back into bed with [facility] as they weren't sure how to use the [facility]. This  PT and PT student Dahlia Client went in with [place], NT and assisted pt back to bed with use of Stedy.  Pt pleasant and happy that PT assisted her.  Pt was fatigued. Pt will benefit from skilled PT to increase their independence and safety with mobility to allow discharge to the venue listed below.    Follow Up Recommendations  CIR     Equipment Recommendations  Wheelchair (measurements PT);Wheelchair cushion (measurements PT);3in1 (PT);Rolling walker with 5" wheels    Recommendations for Other Services       Precautions / Restrictions Precautions Precautions:  Fall Other Brace/Splint: L PRAFO Restrictions Weight Bearing Restrictions: No RUE Weight Bearing: Weight bearing as tolerated LUE Weight Bearing: Weight bearing as tolerated RLE Weight Bearing: Weight bearing as tolerated LLE Weight Bearing: Weight bearing as tolerated    Mobility  Bed Mobility Overal bed mobility: Needs Assistance Bed Mobility: Sit to Supine Rolling: Min assist   Supine to sit: Min assist;HOB elevated Sit to supine: +2 for physical assistance;Mod assist   General bed mobility comments: needed assist for trunk to lie down and for LEs.  Used bed to assist pt scooting up to [facility] with pad.   Transfers Overall transfer level: Needs assistance Equipment used: Rolling walker (2 wheeled) Transfers: Sit to/from Stand Sit to Stand: Mod assist;+2 physical assistance Stand pivot transfers: Mod assist       General transfer comment: +2 mod A to bring hips and trunk up from low recliner. Pt stood on Stedy x 1 minute with min guard. Able to come to stand from the semi standing position on the Stedy seat with min guard assist. Pt with left foot tending to slide laterally into lateral barrier.   Pt moved in [place] from recliner to bed.    Ambulation/Gait                 Stairs            Wheelchair Mobility    Modified Rankin (Stroke Patients Only)       Balance   Sitting-balance support: No upper extremity supported;Feet supported Sitting balance-[name]: Good  Standing balance support: Bilateral upper extremity supported;During functional activity Standing balance-[name]: Poor Standing balance comment: Stood in [employer]                    Cognition Arousal/Alertness: Awake/alert Behavior During Therapy: WFL for tasks assessed/performed Overall Cognitive Status: Impaired/Different from baseline Area of Impairment: Attention;Memory           Awareness: Emergent Problem Solving: Slow processing General Comments: Improving  daily with cognitive abilities    Exercises      General Comments        Pertinent Vitals/Pain Pain Assessment: No/denies pain Faces Pain Scale: Hurts little more Pain Location: BLE; R chest tube site Pain Descriptors / Indicators: Discomfort;Grimacing;Guarding Pain Intervention(s): Limited activity within patient's tolerance  VSS    Home Living                      Prior Function            PT Goals (current goals can now be found in the care plan section) Acute Rehab PT Goals Patient Stated Goal: to prepare to go to inpatient rehab.  Progress towards PT goals: Progressing toward goals    Frequency  Min 5X/week    PT Plan Current plan remains appropriate    Co-evaluation             End of Session Equipment Utilized During Treatment: Gait belt Activity Tolerance: Patient tolerated treatment well Patient left: in bed;with call bell/phone within reach;with nursing/sitter in room     Time: 1610-9604 PT Time Calculation (min) (ACUTE ONLY): 15 min  Charges:  $Therapeutic Activity: 8-22 mins                    G CodesTawni Millers F 2016-06-26, 4:30 PM Harrell Niehoff,PT Acute Rehabilitation [phone] (pager)

## 2016-05-31 NOTE — Progress Notes (Addendum)
Trauma Service Note  Subjective: Patient doing quite well.  Very motivated and doing well with PT/OT.  Ready for rehab.  Psychiatric evaluation in limbo.  Need fim decision about psychiatric disposition.  Objective: Vital signs in last 24 hours: Temp:  [97.9 F (36.6 C)-98.7 F (37.1 C)] 98.4 F (36.9 C) (07/24 0603) Pulse Rate:  [66-71] 70 (07/24 0603) Resp:  [17-18] 18 (07/24 0603) BP: (116-146)/(59-90) 146/90 (07/24 0603) SpO2:  [100 %] 100 % (07/24 0603) Weight:  [80.4 kg (177 lb 4.8 oz)] 80.4 kg (177 lb 4.8 oz) (07/24 0603) Last BM Date: 05/27/16  Intake/Output from previous day: 07/23 0701 - 07/24 0700 In: 880 [P.O.:880] Out: -  Intake/Output this shift: No intake/output data recorded.  General: No acute distress  Lungs: Slightly diminished on the right side.  Oxygenation is good.  Abd: Benign  Extremities: No changes.  Concerned about a small in her right mid-thigh.  Just seems to be firm fatty tissue on my examination.  No infection or growth of concern  Neuro: Intact  Lab Results: CBC  No results for input(s): WBC, HGB, HCT, PLT in the last 72 hours. BMET No results for input(s): NA, K, CL, CO2, GLUCOSE, BUN, CREATININE, CALCIUM in the last 72 hours. PT/INR No results for input(s): LABPROT, INR in the last 72 hours. ABG No results for input(s): PHART, HCO3 in the last 72 hours.  Invalid input(s): PCO2, PO2  Studies/Results: No results found.  Anti-infectives: Anti-infectives    Start     Dose/Rate Route Frequency Ordered Stop   05/15/16 1430  cefUROXime (ZINACEF) 1.5 g in dextrose 5 % 50 mL IVPB     1.5 g 100 mL/hr over 30 Minutes Intravenous Every 12 hours 05/15/16 1042 05/17/16 0255   05/14/16 1400  cefUROXime (ZINACEF) 1.5 g in dextrose 5 % 50 mL IVPB     1.5 g 100 mL/hr over 30 Minutes Intravenous Every 12 hours 05/14/16 1255 05/15/16 0300   05/10/16 1230  sulfamethoxazole-trimethoprim (BACTRIM DS,SEPTRA DS) 800-160 MG per tablet 1 tablet   Status:  Discontinued     1 tablet Oral Every 12 hours 05/10/16 1122 05/21/16 0742   05/07/16 1300  ciprofloxacin (CIPRO) tablet 500 mg  Status:  Discontinued     500 mg Oral 2 times daily 05/07/16 1200 05/10/16 1122   05/06/16 1700  ceFEPIme (MAXIPIME) 2 g in dextrose 5 % 50 mL IVPB  Status:  Discontinued     2 g 100 mL/hr over 30 Minutes Intravenous Every 8 hours 05/06/16 1316 05/07/16 1200   04/27/16 1000  ceFEPIme (MAXIPIME) 2 g in dextrose 5 % 50 mL IVPB  Status:  Discontinued     2 g 100 mL/hr over 30 Minutes Intravenous Every 12 hours 04/27/16 0818 05/06/16 1316   04/22/16 1200  piperacillin-tazobactam (ZOSYN) IVPB 3.375 g  Status:  Discontinued     3.375 g 12.5 mL/hr over 240 Minutes Intravenous Every 6 hours 04/22/16 1039 04/22/16 1042   04/22/16 1200  vancomycin (VANCOCIN) 1,500 mg in sodium chloride 0.9 % 500 mL IVPB  Status:  Discontinued     1,500 mg 250 mL/hr over 120 Minutes Intravenous Every 12 hours 04/22/16 1133 04/28/16 0115   04/22/16 1130  piperacillin-tazobactam (ZOSYN) IVPB 3.375 g  Status:  Discontinued     3.375 g 12.5 mL/hr over 240 Minutes Intravenous Every 8 hours 04/22/16 1043 04/27/16 0754   04/02/16 1200  fluconazole (DIFLUCAN) 40 MG/ML suspension 400 mg     400 mg Per  Tube  Once 04/02/16 1026 04/02/16 1442   03/31/16 1000  sulfamethoxazole-trimethoprim (BACTRIM,SEPTRA) 200-40 MG/5ML suspension 40 mL  Status:  Discontinued     40 mL Per Tube Every 12 hours 03/31/16 0927 03/31/16 0956   03/31/16 1000  sulfamethoxazole-trimethoprim (BACTRIM,SEPTRA) 200-40 MG/5ML suspension 20 mL     20 mL Per Tube Every 6 hours 03/31/16 0956 04/03/16 2359   03/26/16 1030  cefTRIAXone (ROCEPHIN) 2 g in dextrose 5 % 50 mL IVPB  Status:  Discontinued     2 g 100 mL/hr over 30 Minutes Intravenous Every 24 hours 03/26/16 1023 03/31/16 0921   03/25/16 1000  ceFEPIme (MAXIPIME) 2 g in dextrose 5 % 50 mL IVPB  Status:  Discontinued     2 g 100 mL/hr over 30 Minutes Intravenous Every  12 hours 03/25/16 0829 03/26/16 1023   03/08/16 1400  levofloxacin (LEVAQUIN) IVPB 750 mg     750 mg 100 mL/hr over 90 Minutes Intravenous Every 24 hours 03/08/16 0912 03/10/16 1501   03/06/16 1400  ceFEPIme (MAXIPIME) 2 g in dextrose 5 % 50 mL IVPB  Status:  Discontinued     2 g 100 mL/hr over 30 Minutes Intravenous Every 12 hours 03/06/16 1344 03/08/16 0912   03/01/16 1330  ceFAZolin (ANCEF) IVPB 2g/100 mL premix     2 g 200 mL/hr over 30 Minutes Intravenous To ShortStay Surgical 02/29/16 0917 03/01/16 1452   02/11/16 0600  piperacillin-tazobactam (ZOSYN) IVPB 3.375 g  Status:  Discontinued     3.375 g 100 mL/hr over 30 Minutes Intravenous 4 times per day 02/10/16 2214 02/22/16 0853   02/10/16 1730  piperacillin-tazobactam (ZOSYN) IVPB 3.375 g  Status:  Discontinued     3.375 g 12.5 mL/hr over 240 Minutes Intravenous 3 times per day 02/10/16 1727 02/10/16 2214      Assessment/Plan: s/p Procedure(s): RIGHT VIDEO ASSISTED THORACOSCOPY (VATS),DRAINAGE OF EMPYEMA TRANSESOPHAGEAL ECHOCARDIOGRAM (TEE) EVACUATION HEMATOMA Awaiting psychiatric decision about further care.  Patient could go to Rehab today.  LOS: 112 days   Marta Lamas. Gae Bon, MD, FACS [phone] Trauma Surgeon 05/31/2016

## 2016-05-31 NOTE — Progress Notes (Signed)
Have called Trauma CM about sending PT to CIR, no return call.

## 2016-05-31 NOTE — Progress Notes (Signed)
Occupational Therapy Treatment Patient Details Name: [name] Bell MRN: [id number] DOB: 1981/01/02 Today's Date: 05/31/2016    History of present illness [name] Bell is a 35 y.o. female admitted to [facility] on 02/09/16 s/p attempted suicide: taped DNR note to chest, drank Drano, then jumped off 5 story building ([address]).S/p 02/09/16 gel foam embolization of bil internal iliac arteries.S/p 02/09/16 ex lap with cholecystectomy, resection SB and mesenteric repair, liver lac repair, application or wound vac.S/p 02/09/16 closed reduction of pelvic ring, external pelvic fixation.Also has right metacarpal fracture in splint. Pt on and off intubation via trach/ and on/off trac collar.  Pelvic x- fixator pins removed at bedside on 03/23/16.  Underwent thoracotomy 6/19 due to empyema/abcess with 2 chest tubes, one d/c 04/30/16, second d/c 05/05/16 and pt no longer has trach.  Pt with no significant PMHx listed in the chart. Pt had acute speech and left sided weakness changes on7/3/17 and both CT and MRI confirmed stroke (R frontoparietal).  Thoracotomy and evac of hematoma 05/14/16.     OT comments  Pt more tearful today, but expressing her feelings of being anxious and fearful over "doing so well and being "scared that something is going to happen to set me back again". Pt accomplished goal of ambulating @ 10 feet to BSC in front of sink with Mod A @ RW level. Pt used BSC and said that she no longer wanted to use briefs. Pt tearful about seeing herself for the first time since admission, but expressed her feelings and stated, "I know I can do this one day at a time". Pt continues to progress each visit and is appropriate for CIR. Will continue to follow acutely to facilitate D/C to next venue of care.   Follow Up Recommendations  CIR;Supervision/Assistance - 24 hour    Equipment Recommendations  3 in 1 bedside comode    Recommendations for Other Services Rehab consult    Precautions / Restrictions  Precautions Precautions: Fall Other Brace/Splint: L PRAFO       Mobility Bed Mobility Overal bed mobility: Needs Assistance Bed Mobility: Sit to Supine     Supine to sit: Min assist;HOB elevated     General bed mobility comments: assistance with increased HOB coing up on L side of bed  Transfers Overall transfer level: Needs assistance Equipment used: Rolling walker (2 wheeled) Transfers: Sit to/from [employer] Sit to Stand: Mod assist Stand pivot transfers: Mod assist            Balance     Sitting balance-[name]: Good       Standing balance-[name]: Poor                     ADL Overall ADL's : Needs assistance/impaired                         Toilet Transfer: Moderate assistance;Ambulation;BSC;RW   Toileting- Clothing Manipulation and Hygiene: Moderate assistance;Sit to/from stand       Functional mobility during ADLs: Moderate assistance;Rolling walker;Cueing for safety;Cueing for sequencing General ADL Comments: Focus of session involved ambulating to [facility] at sink. Using RUE to complete pericare. REquried A to complete and for L buttock.   Pt has not wanted to sit on the [facility] or agree to not using briefs until today.       Vision                 Additional  Comments: wears glasses   Perception     Praxis      Cognition   Behavior During Therapy: Anxious Overall Cognitive Status: Impaired/Different from baseline Area of Impairment: Attention;Memory   Pt expressing feeling of anxiety regarding the feeling that "something will go wrong and prevent her from going to rehab". Pt expressed her feelings of frustration over the weekend with the difficulty nsg staff had mobilizing her with the "stedy". Discussed her need to remain goal focused and to address her concerns but to not dwell on things that had happened and to move forward toward her goals of becoming more independent to achieve her ultimate goal  of D/C home.  Pt remains goal focused and discussed goals she has achieved.            Awareness: Emergent Problem Solving: Slow processing General Comments: Improving daily with cognitive abilities    Extremity/Trunk Assessment               Exercises     Shoulder Instructions       General Comments      Pertinent Vitals/ Pain       Pain Assessment: Faces Faces Pain Scale: Hurts little more Pain Location: BLE; R chest tube site Pain Descriptors / Indicators: Discomfort;Grimacing;Guarding Pain Intervention(s): Limited activity within patient's tolerance  Home Living                                          Prior Functioning/Environment              Frequency Min 3X/week     Progress Toward Goals  OT Goals(current goals can now be found in the care plan section)  Progress towards OT goals: Progressing toward goals  Acute Rehab OT Goals Patient Stated Goal: to prepare to go to inpatient rehab.  OT Goal Formulation: With patient Time For Goal Achievement: 06/08/16 Potential to Achieve Goals: Good ADL Goals Pt Will Perform Lower Body Bathing: with mod assist;sitting/lateral leans Pt Will Perform Lower Body Dressing: with mod assist;bed level Pt Will Transfer to Toilet: with +2 assist;with min assist;stand pivot transfer Pt Will Perform Toileting - Clothing Manipulation and hygiene: with min assist;sit to/from stand;sitting/lateral leans Pt/caregiver will Perform Home Exercise Program: Increased strength;Left upper extremity;With minimal assist Additional ADL Goal #1: Pt will get OOB to chair daily with min A inpreparation for ADL Additional ADL Goal #2: Pt will complete bed mobiilty with S in preparation for ADL Additional ADL Goal #3: Pt will complete check list for goals and re-establish goals weekly with min vc Additional ADL Goal #4: Complete bed mobility with S for ADL  Plan Discharge plan remains appropriate     Co-evaluation                 End of Session Equipment Utilized During Treatment: Gait belt;Rolling walker   Activity Tolerance Patient tolerated treatment well   Patient Left in chair;with call bell/phone within reach;with family/visitor present   Nurse Communication Mobility status        Time: 1225-1305 OT Time Calculation (min): 40 min  Charges: OT General Charges $OT Visit: 1 Procedure OT Treatments $Self Care/Home Management : 38-52 mins  Leighann Amadon,HILLARY 05/31/2016, 3:13 PM   [facility], OT/L  [phone] 05/31/2016

## 2016-06-01 NOTE — Progress Notes (Signed)
Patient ID: [name] Bell, female   DOB: May 28, 1981, 34 y.o.   MRN: [id number] 18 Days Post-Op  Subjective: Tearful and frustrated regarding psychiatry re-eval and the effect on her disposition  Objective: Vital signs in last 24 hours: Temp:  [98.6 F (37 C)] 98.6 F (37 C) (07/24 2156) Pulse Rate:  [70] 70 (07/24 2156) Resp:  [18] 18 (07/24 2156) BP: (134)/(84) 134/84 (07/24 2156) SpO2:  [100 %] 100 % (07/24 2156) Last BM Date: 05/28/16  Intake/Output from previous day: 07/24 0701 - 07/25 0700 In: 530 [P.O.:530] Out: -  Intake/Output this shift: No intake/output data recorded.  General appearance: cooperative Resp: clear to auscultation bilaterally Chest wall: R dressings, palpable rib callus under R breast (patient asked about it) Cardio: regular rate and rhythm GI: soft, NT, ND, midline wound clean  Lab Results: CBC  No results for input(s): WBC, HGB, HCT, PLT in the last 72 hours. BMET No results for input(s): NA, K, CL, CO2, GLUCOSE, BUN, CREATININE, CALCIUM in the last 72 hours. PT/INR No results for input(s): LABPROT, INR in the last 72 hours. ABG No results for input(s): PHART, HCO3 in the last 72 hours.  Invalid input(s): PCO2, PO2  Studies/Results: No results found.  Anti-infectives: Anti-infectives    Start     Dose/Rate Route Frequency Ordered Stop   05/15/16 1430  cefUROXime (ZINACEF) 1.5 g in dextrose 5 % 50 mL IVPB     1.5 g 100 mL/hr over 30 Minutes Intravenous Every 12 hours 05/15/16 1042 05/17/16 0255   05/14/16 1400  cefUROXime (ZINACEF) 1.5 g in dextrose 5 % 50 mL IVPB     1.5 g 100 mL/hr over 30 Minutes Intravenous Every 12 hours 05/14/16 1255 05/15/16 0300   05/10/16 1230  sulfamethoxazole-trimethoprim (BACTRIM DS,SEPTRA DS) 800-160 MG per tablet 1 tablet  Status:  Discontinued     1 tablet Oral Every 12 hours 05/10/16 1122 05/21/16 0742   05/07/16 1300  ciprofloxacin (CIPRO) tablet 500 mg  Status:  Discontinued     500 mg Oral 2 times  daily 05/07/16 1200 05/10/16 1122   05/06/16 1700  ceFEPIme (MAXIPIME) 2 g in dextrose 5 % 50 mL IVPB  Status:  Discontinued     2 g 100 mL/hr over 30 Minutes Intravenous Every 8 hours 05/06/16 1316 05/07/16 1200   04/27/16 1000  ceFEPIme (MAXIPIME) 2 g in dextrose 5 % 50 mL IVPB  Status:  Discontinued     2 g 100 mL/hr over 30 Minutes Intravenous Every 12 hours 04/27/16 0818 05/06/16 1316   04/22/16 1200  piperacillin-tazobactam (ZOSYN) IVPB 3.375 g  Status:  Discontinued     3.375 g 12.5 mL/hr over 240 Minutes Intravenous Every 6 hours 04/22/16 1039 04/22/16 1042   04/22/16 1200  vancomycin (VANCOCIN) 1,500 mg in sodium chloride 0.9 % 500 mL IVPB  Status:  Discontinued     1,500 mg 250 mL/hr over 120 Minutes Intravenous Every 12 hours 04/22/16 1133 04/28/16 0115   04/22/16 1130  piperacillin-tazobactam (ZOSYN) IVPB 3.375 g  Status:  Discontinued     3.375 g 12.5 mL/hr over 240 Minutes Intravenous Every 8 hours 04/22/16 1043 04/27/16 0754   04/02/16 1200  fluconazole (DIFLUCAN) 40 MG/ML suspension 400 mg     400 mg Per Tube  Once 04/02/16 1026 04/02/16 1442   03/31/16 1000  sulfamethoxazole-trimethoprim (BACTRIM,SEPTRA) 200-40 MG/5ML suspension 40 mL  Status:  Discontinued     40 mL Per Tube Every 12 hours 03/31/16 0927 03/31/16 0956  03/31/16 1000  sulfamethoxazole-trimethoprim (BACTRIM,SEPTRA) 200-40 MG/5ML suspension 20 mL     20 mL Per Tube Every 6 hours 03/31/16 0956 04/03/16 2359   03/26/16 1030  cefTRIAXone (ROCEPHIN) 2 g in dextrose 5 % 50 mL IVPB  Status:  Discontinued     2 g 100 mL/hr over 30 Minutes Intravenous Every 24 hours 03/26/16 1023 03/31/16 0921   03/25/16 1000  ceFEPIme (MAXIPIME) 2 g in dextrose 5 % 50 mL IVPB  Status:  Discontinued     2 g 100 mL/hr over 30 Minutes Intravenous Every 12 hours 03/25/16 0829 03/26/16 1023   03/08/16 1400  levofloxacin (LEVAQUIN) IVPB 750 mg     750 mg 100 mL/hr over 90 Minutes Intravenous Every 24 hours 03/08/16 0912 03/10/16 1501    03/06/16 1400  ceFEPIme (MAXIPIME) 2 g in dextrose 5 % 50 mL IVPB  Status:  Discontinued     2 g 100 mL/hr over 30 Minutes Intravenous Every 12 hours 03/06/16 1344 03/08/16 0912   03/01/16 1330  ceFAZolin (ANCEF) IVPB 2g/100 mL premix     2 g 200 mL/hr over 30 Minutes Intravenous To ShortStay Surgical 02/29/16 0917 03/01/16 1452   02/11/16 0600  piperacillin-tazobactam (ZOSYN) IVPB 3.375 g  Status:  Discontinued     3.375 g 100 mL/hr over 30 Minutes Intravenous 4 times per day 02/10/16 2214 02/22/16 0853   02/10/16 1730  piperacillin-tazobactam (ZOSYN) IVPB 3.375 g  Status:  Discontinued     3.375 g 12.5 mL/hr over 240 Minutes Intravenous 3 times per day 02/10/16 1727 02/10/16 2214      Assessment/Plan: Jump from hotel/multiple toxic ingestions- Continue on Zoloft, sitter for safety.  S/P ex lap, hepatorraphy, SBR, cholecystectomy, preperitoneal pelvic packing [name] Bell 4/3  Ex lap for acidosis/bleeding 4/4 [name] Bell Ex lap for bleeding 4/5 [name] Bell S/P ex fix pelvis [name] Bell 4/3 S/P ex lap 4/7 [name] Bell S/P ex lap 4/10 [name] Bell S/P sacral screws 4/10 [name] Bell S/P application ABRA, open gastrostomy tube 4/12 [name] Bell  S/P abd closure, trach 4/24 [name] Bell Mult B rib FX/R HPTX, B pulm contusions  Multiple toxic ingestions Mult L wrist FXs - ok to remove splint per Dr. Melvyn Bell  Pelvic FX - S/P ex fix and angioembolization, S/P sacral screw by Dr. Carola Bell. WBAT BLE. 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 Appreciate neurology F/U. Gradually improving. Needs CIR VTE - SCD's, ASA per neuro Dispo -pending CIR once psych dispo set. [name] would prefer outpatient psychiatry F/U after CIR and expresses frustration with the psychiatry service here.  LOS: 113 days    [name] Gelinas, MD, MPH, FACS Trauma: [phone] General Surgery: [phone]  06/01/2016

## 2016-06-01 NOTE — Progress Notes (Signed)
Received call from pt's sister, [name] Bell, shortly after I met with pt.  She is very upset, stating that she is tired of the inconsistencies between physicians and health care providers.  She states that she is tired of seeing her sister upset, like she was yesterday when she did not go to rehab, and when she was told that she needs to go to [place] says she does not trust the healthcare team anymore, and she may have to get a Chief Executive Officer.  She said she should have had her sister moved to another hospital, and still may need to.  Sister was yelling and cursing into the phone.  I asked her to lower her voice, and she stated, "so this is how it's going to be?"  "My sister is not going to that mental health prison; over my dead body!"  Attempted to deescalate situation, but sister was not to be calmed.  She eventually hung up on me.    Reinaldo Raddle, RN, BSN  Trauma/Neuro ICU Case Manager [phone]

## 2016-06-01 NOTE — Progress Notes (Signed)
Physical Therapy Treatment Patient Details Name: [name] Bell MRN: [id number] DOB: Jul 30, 1981 Today's Date: 06/01/2016    History of Present Illness [name] Bell is a 35 y.o. female admitted to [facility] on 02/09/16 s/p attempted suicide: taped DNR note to chest, drank Drano, then jumped off 5 story building ([address]).S/p 02/09/16 gel foam embolization of bil internal iliac arteries.S/p 02/09/16 ex lap with cholecystectomy, resection SB and mesenteric repair, liver lac repair, application or wound vac.S/p 02/09/16 closed reduction of pelvic ring, external pelvic fixation.Also has right metacarpal fracture in splint. Pt on and off intubation via trach/ and on/off trac collar.  Pelvic x- fixator pins removed at bedside on 03/23/16.  Underwent thoracotomy 6/19 due to empyema/abcess with 2 chest tubes, one d/c 04/30/16, second d/c 05/05/16 and pt no longer has trach.  Pt with no significant PMHx listed in the chart. Pt had acute speech and left sided weakness changes on7/3/17 and both CT and MRI confirmed stroke (R frontoparietal).  Thoracotomy and evac of hematoma 05/14/16.      PT Comments    Pt continues to show physical improvements. She is disappointed by her recent psych re-eval, but doesn't want it to affect her recovery. She was very motivated to work with PT today. Pt ambulated in the hallway for 75 feet to the nurses station with one seated rest break. She was smiling and making jokes in the hallway and proud when she met her goal.    Follow Up Recommendations  CIR     Equipment Recommendations  Wheelchair (measurements PT);Wheelchair cushion (measurements PT);3in1 (PT);Rolling walker with 5" wheels       Precautions / Restrictions Precautions Precautions: Fall Precaution Comments: left hemiparesis Required Braces or Orthoses: Other Brace/Splint Other Brace/Splint: L PRAFO Restrictions Weight Bearing Restrictions: No RUE Weight Bearing: Weight bearing as tolerated LUE Weight  Bearing: Weight bearing as tolerated RLE Weight Bearing: Weight bearing as tolerated LLE Weight Bearing: Weight bearing as tolerated    Mobility  Bed Mobility Overal bed mobility: Needs Assistance Bed Mobility: Supine to Sit     Supine to sit: Min assist;HOB elevated     General bed mobility comments: Exited bed to the left instead of the right today. Pt required assist to progress LLE to EOB and handheld assist to raise from supine to sit EOB.  Transfers Overall transfer level: Needs assistance Equipment used: Rolling walker (2 wheeled) Transfers: Sit to/from Stand (x2) Sit to Stand: +2 physical assistance;Mod assist;Min assist;From elevated surface         General transfer comment: +2 min assist from elevated surface, but mod assist required when rising from recliner. Chuck pad under hips for sit to stand into RW. Pt able to push off the recliner arm rests to assists. Once her hands are on the walker she is able to stand upright.  Ambulation/Gait Ambulation/Gait assistance: Min assist;+2 safety/equipment Ambulation Distance (Feet): 75 Feet Assistive device: Rolling walker (2 wheeled) Gait Pattern/deviations: Step-to pattern;Decreased step length - left Gait velocity: decreased  Gait velocity interpretation: Below normal speed for age/gender General Gait Details: Pt's strength and coordination on the L is showing improvement. She was able to ambulate with out assist from therapist to brace L knee or facilitate knee extension.  Pt required one sitting rest break during ambulation, but met her goal of walking to the nurse's station. Min assist was required to assist pt in steering the walker. She had dificulty turning and veered to the R during gait.  Balance Overall balance assessment: Needs assistance Sitting-balance support: Feet supported;No upper extremity supported Sitting balance-[name]: Good Sitting balance - Comments: Sitting balance continues to show  improvement. She requires less time to acclimate to a seated position after rising from supine.    Standing balance support: Bilateral upper extremity supported;Single extremity supported Standing balance-[name]: Poor Standing balance comment: Able to take R hand off walker for a few seconds to give PA high five and touch the nurses station, without LOB.                    Cognition Arousal/Alertness: Awake/alert Behavior During Therapy: WFL for tasks assessed/performed Overall Cognitive Status: Impaired/Different from baseline Area of Impairment: Attention;Memory           Awareness: Emergent Problem Solving: Slow processing General Comments: Improving daily with cognitive abilities           Pertinent Vitals/Pain Pain Assessment: No/denies pain           PT Goals (current goals can now be found in the care plan section) Acute Rehab PT Goals Patient Stated Goal: To walk around the nurse's station PT Goal Formulation: With patient Time For Goal Achievement: 06/08/16 Potential to Achieve Goals: Good Progress towards PT goals: Progressing toward goals    Frequency  Min 5X/week    PT Plan Current plan remains appropriate       End of Session Equipment Utilized During Treatment: Gait belt Activity Tolerance: Patient tolerated treatment well Patient left: in chair;with call bell/phone within reach;with nursing/sitter in room     Time: 5927-6394 PT Time Calculation (min) (ACUTE ONLY): 38 min  Charges:                       G CodesBenjiman Core, [place]le [place] [phone] 06/01/2016, 12:33 PM

## 2016-06-01 NOTE — Progress Notes (Signed)
Called to room by patient to discuss discharge plans.  She states she is upset, as she was told by "someone" that she was going to CIR yesterday, and waited all day to go.  She then learned that it was a mistake, and that she had not been accepted yet.  She is afraid this has something to do with visit from psych MD, and the fact that he is recommending IP psychiatric treatment at [facility].  She states she has been told that she has to go to [facility] after [facility] for psych treatment, and she does not want to go.  She feels she will not have control over her therapy, and will be better suited by psych treatment on an outpatient basis.     Assured pt that she is still being considered for possible admission to inpatient rehab, and that we are hopeful this will happen soon.  Explained that we have to go by psychiatrist's recommendations, and that she may not have a lot of choice with regard to the inpatient psychiatric treatment.  She seemed relieved that CIR is still an option, and states she may just have to "push through" the inpatient psych.    She asks that I tell the physicians to try to get the information straight before telling her things, because it is confusing to her.  Pt thanked me for clarifying things for her.    Quintella Baton, RN, BSN  Trauma/Neuro ICU Case Manager [phone]

## 2016-06-02 NOTE — Progress Notes (Signed)
Trauma Service Note  Subjective: No issues overnight, moving well with walker  Objective: Vital signs in last 24 hours: Temp:  [98.1 F (36.7 C)-99 F (37.2 C)] 99 F (37.2 C) (07/26 0515) Pulse Rate:  [71-74] 71 (07/26 0515) Resp:  [18-20] 18 (07/26 0515) BP: (134-137)/(78-81) 137/81 (07/26 0515) SpO2:  [99 %-100 %] 99 % (07/26 0515) Weight:  [79 kg (174 lb 3.2 oz)] 79 kg (174 lb 3.2 oz) (07/26 0515) Last BM Date: 05/28/16  Intake/Output from previous day: 07/25 0701 - 07/26 0700 In: 840 [P.O.:840] Out: -  Intake/Output this shift: No intake/output data recorded.  General: NAD  Lungs: CTAB  Abd: nontender, nondistended  Extremities: 5/5 strength in right leg and right arm, 4/5 strength left arm, 2/5 strength left thigh, 0/5 strength left calf/foot  Neuro: AOx4  Lab Results: CBC  No results for input(s): WBC, HGB, HCT, PLT in the last 72 hours. BMET No results for input(s): NA, K, CL, CO2, GLUCOSE, BUN, CREATININE, CALCIUM in the last 72 hours. PT/INR No results for input(s): LABPROT, INR in the last 72 hours. ABG No results for input(s): PHART, HCO3 in the last 72 hours.  Invalid input(s): PCO2, PO2  Studies/Results: No results found.  Anti-infectives: Anti-infectives    Start     Dose/Rate Route Frequency Ordered Stop   05/15/16 1430  cefUROXime (ZINACEF) 1.5 g in dextrose 5 % 50 mL IVPB     1.5 g 100 mL/hr over 30 Minutes Intravenous Every 12 hours 05/15/16 1042 05/17/16 0255   05/14/16 1400  cefUROXime (ZINACEF) 1.5 g in dextrose 5 % 50 mL IVPB     1.5 g 100 mL/hr over 30 Minutes Intravenous Every 12 hours 05/14/16 1255 05/15/16 0300   05/10/16 1230  sulfamethoxazole-trimethoprim (BACTRIM DS,SEPTRA DS) 800-160 MG per tablet 1 tablet  Status:  Discontinued     1 tablet Oral Every 12 hours 05/10/16 1122 05/21/16 0742   05/07/16 1300  ciprofloxacin (CIPRO) tablet 500 mg  Status:  Discontinued     500 mg Oral 2 times daily 05/07/16 1200 05/10/16 1122   05/06/16 1700  ceFEPIme (MAXIPIME) 2 g in dextrose 5 % 50 mL IVPB  Status:  Discontinued     2 g 100 mL/hr over 30 Minutes Intravenous Every 8 hours 05/06/16 1316 05/07/16 1200   04/27/16 1000  ceFEPIme (MAXIPIME) 2 g in dextrose 5 % 50 mL IVPB  Status:  Discontinued     2 g 100 mL/hr over 30 Minutes Intravenous Every 12 hours 04/27/16 0818 05/06/16 1316   04/22/16 1200  piperacillin-tazobactam (ZOSYN) IVPB 3.375 g  Status:  Discontinued     3.375 g 12.5 mL/hr over 240 Minutes Intravenous Every 6 hours 04/22/16 1039 04/22/16 1042   04/22/16 1200  vancomycin (VANCOCIN) 1,500 mg in sodium chloride 0.9 % 500 mL IVPB  Status:  Discontinued     1,500 mg 250 mL/hr over 120 Minutes Intravenous Every 12 hours 04/22/16 1133 04/28/16 0115   04/22/16 1130  piperacillin-tazobactam (ZOSYN) IVPB 3.375 g  Status:  Discontinued     3.375 g 12.5 mL/hr over 240 Minutes Intravenous Every 8 hours 04/22/16 1043 04/27/16 0754   04/02/16 1200  fluconazole (DIFLUCAN) 40 MG/ML suspension 400 mg     400 mg Per Tube  Once 04/02/16 1026 04/02/16 1442   03/31/16 1000  sulfamethoxazole-trimethoprim (BACTRIM,SEPTRA) 200-40 MG/5ML suspension 40 mL  Status:  Discontinued     40 mL Per Tube Every 12 hours 03/31/16 0927 03/31/16 0956  03/31/16 1000  sulfamethoxazole-trimethoprim (BACTRIM,SEPTRA) 200-40 MG/5ML suspension 20 mL     20 mL Per Tube Every 6 hours 03/31/16 0956 04/03/16 2359   03/26/16 1030  cefTRIAXone (ROCEPHIN) 2 g in dextrose 5 % 50 mL IVPB  Status:  Discontinued     2 g 100 mL/hr over 30 Minutes Intravenous Every 24 hours 03/26/16 1023 03/31/16 0921   03/25/16 1000  ceFEPIme (MAXIPIME) 2 g in dextrose 5 % 50 mL IVPB  Status:  Discontinued     2 g 100 mL/hr over 30 Minutes Intravenous Every 12 hours 03/25/16 0829 03/26/16 1023   03/08/16 1400  levofloxacin (LEVAQUIN) IVPB 750 mg     750 mg 100 mL/hr over 90 Minutes Intravenous Every 24 hours 03/08/16 0912 03/10/16 1501   03/06/16 1400  ceFEPIme (MAXIPIME)  2 g in dextrose 5 % 50 mL IVPB  Status:  Discontinued     2 g 100 mL/hr over 30 Minutes Intravenous Every 12 hours 03/06/16 1344 03/08/16 0912   03/01/16 1330  ceFAZolin (ANCEF) IVPB 2g/100 mL premix     2 g 200 mL/hr over 30 Minutes Intravenous To [facility] 02/29/16 0917 03/01/16 1452   02/11/16 0600  piperacillin-tazobactam (ZOSYN) IVPB 3.375 g  Status:  Discontinued     3.375 g 100 mL/hr over 30 Minutes Intravenous 4 times per day 02/10/16 2214 02/22/16 0853   02/10/16 1730  piperacillin-tazobactam (ZOSYN) IVPB 3.375 g  Status:  Discontinued     3.375 g 12.5 mL/hr over 240 Minutes Intravenous 3 times per day 02/10/16 1727 02/10/16 2214      Medications Scheduled Meds: . atorvastatin  20 mg Oral q1800  . bacitracin   Topical BID  . bisacodyl  10 mg Oral Daily  . clonazePAM  0.5 mg Oral QHS  . ferrous gluconate  324 mg Oral BID WC  . lacosamide  100 mg Oral BID  . multivitamin with minerals  1 tablet Oral Daily  . polyethylene glycol  17 g Oral BID  . senna-docusate  1 tablet Oral QHS  . sertraline  100 mg Oral Daily   Continuous Infusions:  PRN Meds:.diphenhydrAMINE, hydrALAZINE, ipratropium-albuterol, levalbuterol, lidocaine-prilocaine, morphine injection, ondansetron (ZOFRAN) IV, oxyCODONE, phenol, potassium chloride, simethicone, sodium phosphate  Assessment/Plan: s/p Procedure(s): RIGHT VIDEO ASSISTED THORACOSCOPY (VATS),DRAINAGE OF EMPYEMA TRANSESOPHAGEAL ECHOCARDIOGRAM (TEE) EVACUATION HEMATOMA -continue discharge planning -continue sitter per psych  LOS: 114 days   De Blanch Kinsinger Trauma Surgeon [phone] Surgery 06/02/2016

## 2016-06-02 NOTE — Progress Notes (Addendum)
Physical Therapy Treatment Patient Details Name: [name] Bell MRN: [id number] DOB: 12/19/1980 Today's Date: 06/02/2016    History of Present Illness [name] Bell is a 35 y.o. female admitted to [facility] on 02/09/16 s/p attempted suicide: taped DNR note to chest, drank Drano, then jumped off 5 story building ([address]).S/p 02/09/16 gel foam embolization of bil internal iliac arteries.S/p 02/09/16 ex lap with cholecystectomy, resection SB and mesenteric repair, liver lac repair, application or wound vac.S/p 02/09/16 closed reduction of pelvic ring, external pelvic fixation.Also has right metacarpal fracture in splint. Pt on and off intubation via trach/ and on/off trac collar.  Pelvic x- fixator pins removed at bedside on 03/23/16.  Underwent thoracotomy 6/19 due to empyema/abcess with 2 chest tubes, one d/c 04/30/16, second d/c 05/05/16 and pt no longer has trach.  Pt with no significant PMHx listed in the chart. Pt had acute speech and left sided weakness changes on7/3/17 and both CT and MRI confirmed stroke (R frontoparietal).  Thoracotomy and evac of hematoma 05/14/16.      PT Comments    Pt continues to make large strides towards her gait goals.  A few goals were met today and had to be updated.  She was able to walk almost twice as far today as yesterday and wants to continue to push herself to do more and get stronger.  Pt had better foot clearance today after PT applied a DF assist wrap to L foot during gait.  Pt would like to go outside while the weather is cooler this week.   Follow Up Recommendations  CIR     Equipment Recommendations  Rolling walker with 5" wheels;Wheelchair (measurements PT);Wheelchair cushion (measurements PT);3in1 (PT)    Recommendations for Other Services   NA     Precautions / Restrictions Precautions Precautions: Fall Precaution Comments: left sided weakness Other Brace/Splint: L PRAFO (for positioning in bed) Restrictions RUE Weight Bearing: Weight bearing  as tolerated LUE Weight Bearing: Weight bearing as tolerated RLE Weight Bearing: Weight bearing as tolerated LLE Weight Bearing: Weight bearing as tolerated    Mobility  Bed Mobility Overal bed mobility: Needs Assistance       Supine to sit: HOB elevated;Supervision     General bed mobility comments: Pt has been OOB in recliner chair now for 2 hours and stayed in recliner chair at the end of my session.   Transfers Overall transfer level: Needs assistance Equipment used: Rolling walker (2 wheeled) Transfers: Sit to/from Stand Sit to Stand: Min assist Stand pivot transfers: Min assist       General transfer comment: Min assist to support pt at hips to boost up from lower chair.  Verbal cues for hand placement and to scoot out first before attempting to stand from so deep in the chair.  Significant reliance on hands for transitions.  Ambulation/Gait Ambulation/Gait assistance: Min assist;+2 safety/equipment Ambulation Distance (Feet): 210 Feet (with one seated rest break) Assistive device: Rolling walker (2 wheeled) Gait Pattern/deviations: Step-through pattern;Decreased dorsiflexion - left;Decreased weight shift to left;Steppage;Trunk flexed   Gait velocity interpretation: Below normal speed for age/gender General Gait Details: Pt with L leg steppage gait pattern due to decreased DF strength in left foot and ankle.  After seated rest break PT applied L foot DF assist ace wrapping to left lower leg and pt with increased speed and efficiency during gait and less external rotation at the lower leg as she fatigued due to better left foot clearance.  Min assist to support trunk  for safety and chair to follow to encourage increased safe gait distance.  Pt was able to correct/steer RW today.         Modified Rankin (Stroke Patients Only) Modified Rankin (Stroke Patients Only) Pre-Morbid Rankin Score: No symptoms Modified Rankin: Moderately severe disability     Balance Overall  balance assessment: Needs assistance Sitting-balance support: Feet supported;No upper extremity supported Sitting balance-[name]: Good     Standing balance support: Bilateral upper extremity supported;Single extremity supported Standing balance-[name]: Poor                      Cognition Arousal/Alertness: Awake/alert Behavior During Therapy: WFL for tasks assessed/performed Overall Cognitive Status: Impaired/Different from baseline Area of Impairment: Attention;Memory   Current Attention Level: Selective           General Comments: Continues to demonstrate improved cognition daily with increased insight into medical situation and discharge disposition    Exercises General Exercises - Upper Extremity Chair Push Up: 5 reps Other Exercises Other Exercises: shoulder elevation/depression/protraction/retraction Other Exercises: trunk and head extension with BUE abducted    General Comments General comments (skin integrity, edema, etc.): Per pt report, she has been continuing to do her LE supine and seated HEP on her own.       Pertinent Vitals/Pain Pain Assessment: Faces Faces Pain Scale: Hurts little more Pain Location: right side Pain Descriptors / Indicators: Aching;Burning Pain Intervention(s): Limited activity within patient's tolerance;Monitored during session;Repositioned           PT Goals (current goals can now be found in the care plan section) Acute Rehab PT Goals Patient Stated Goal: to get to the elevators today and stay focued on her goal of getting better.  Progress towards PT goals: Progressing toward goals    Frequency  Min 5X/week    PT Plan Current plan remains appropriate       End of Session Equipment Utilized During Treatment: Gait belt Activity Tolerance: Patient limited by fatigue Patient left: in chair;with nursing/sitter in room     Time: 4037-5436 PT Time Calculation (min) (ACUTE ONLY): 31 min  Charges:  $Gait  Training: 23-37 mins                     Riven Mabile B. [place], [place], DPT [phone]   06/02/2016, 4:53 PM

## 2016-06-02 NOTE — Clinical Social Work Psych Note (Addendum)
12:46pm- Psych CSW was informed by [facility]) that the following was needed for review pf patient: PT eval (within 24 hours), brain imaging results, last neurology note and CT scan results.  Psych CSW sent this information into [facility] for their review. Medical Director and RNCM updated.  2:40pm- Psych CSW spoke with patient's sister by phone to reiterate the disposition plan of [facility].  Patient and patient's sister both very adamant that patient would thrive better being more local to her support systems and feels that the admission to [facility] will be a set-back for the patient.  Of note, the patient is engaging in conversations with personality in tact, patient presents with anxiety towards not-knowing her disposition plan, but does not present with a depressed mood as before.  Patient reports remorse for her attempt and states she feels that she is in a good place at this time.  Sister states she feels the patient would benefit from being home with outpatient psychiatric services set up rather than [facility].  I advised the sister that the disposition at this time is [facility] though I would relay her adamancy of the patient being discharged home to our Medical Director and to the psychiatrist.  The sister was grateful for Psych CSW and [facility] assistance.  Made sure to reiterate the current disposition stands and that psych CSW and RNCM cannot make this decision.  Continued to advise sister that the staff are all attempting to coordinate care for the patient for time of discharge.   Vickii Penna, LCSW [phone]  [address] [facility] Psychiatric Service Line Licensed Clinical Social Worker

## 2016-06-02 NOTE — Progress Notes (Signed)
Occupational Therapy Treatment Patient Details Name: MAYDA SHIPPEE MRN: [id number] DOB: 1981-10-11 Today's Date: 06/02/2016    History of present illness JACQUELYNN FRIEND is a 35 y.o. female admitted to [facility] on 02/09/16 s/p attempted suicide: taped DNR note to chest, drank Drano, then jumped off 5 story building ([address]).S/p 02/09/16 gel foam embolization of bil internal iliac arteries.S/p 02/09/16 ex lap with cholecystectomy, resection SB and mesenteric repair, liver lac repair, application or wound vac.S/p 02/09/16 closed reduction of pelvic ring, external pelvic fixation.Also has right metacarpal fracture in splint. Pt on and off intubation via trach/ and on/off trac collar.  Pelvic x- fixator pins removed at bedside on 03/23/16.  Underwent thoracotomy 6/19 due to empyema/abcess with 2 chest tubes, one d/c 04/30/16, second d/c 05/05/16 and pt no longer has trach.  Pt with no significant PMHx listed in the chart. Pt had acute speech and left sided weakness changes on7/3/17 and both CT and MRI confirmed stroke (R frontoparietal).  Thoracotomy and evac of hematoma 05/14/16.     OT comments  Pt continues to progress daily. Pt interactive and discussing her concerns regarding her current disposition regarding possible D/C to inpatient psych. Pt states "I feel much better about going to rehab, then getting outpatient help from a therapist rather then going to an inpatient psych place. That would be horrible for me". Pt also states " I can't focus on that because I need to do what I need to do in order to get better".  Pt demonstrating good insight into her current situation.  Pt is completing ADL with overall min A and completed transfers today during ADL with Min A. LUE functional use continues to improve. Pt has met goals therefore goals upgraded and re-established.   Follow Up Recommendations  CIR;Supervision/Assistance - 24 hour    Equipment Recommendations  3 in 1 bedside comode    Recommendations  for Other Services Rehab consult    Precautions / Restrictions Precautions Precautions: Fall Restrictions RUE Weight Bearing: Weight bearing as tolerated LUE Weight Bearing: Weight bearing as tolerated RLE Weight Bearing: Weight bearing as tolerated LLE Weight Bearing: Weight bearing as tolerated       Mobility Bed Mobility Overal bed mobility: Needs Assistance       Supine to sit: HOB elevated;Supervision     General bed mobility comments: heavy use of bedrails  Transfers Overall transfer level: Needs assistance Equipment used: Rolling walker (2 wheeled)   Sit to Stand: Min assist;From elevated surface Stand pivot transfers: Min assist       General transfer comment: Most likely increased assistance required from lower surfaces    Balance     Sitting balance-[name]: Good       Standing balance-[name]: Poor                     ADL   Eating/Feeding: Independent   Grooming: Wash/dry hands;Wash/dry face;Oral care;Applying deodorant;Set up   Upper Body Bathing: Set up   Lower Body Bathing: Minimal assistance;Sitting/lateral leans (circle sitting in bed)   Upper Body Dressing : Set up;Sitting   Lower Body Dressing: Bed level;Set up Lower Body Dressing Details (indicate cue type and reason): donning socks. Pt has not attempted pants/panties yet       Toileting - Clothing Manipulation Details (indicate cue type and reason): Pt wearing brief today. Discussed goal of using BSC and discontinuing use of brief. Pt expressed her concerns of having difficulty having a BM and  urinary delay. Encouraged pt to discuss concerns with her MD.     Functional mobility during ADLs: Rolling walker;Minimal assistance (took a few steps and pivoted to chair )        Vision                 Additional Comments: wears glasses   Perception     Praxis  improved motor planning with LUE    Cognition   Behavior During Therapy: WFL for tasks  assessed/performed Overall Cognitive Status: Impaired/Different from baseline Area of Impairment: Attention;Memory                General Comments: Continues to demonstrate improved cognition daily with increased insight into medical situation and discharge disposition    Extremity/Trunk Assessment               Exercises General Exercises - Upper Extremity Chair Push Up: 5 reps Other Exercises Other Exercises: shoulder elevation/depression/protraction/retraction Other Exercises: trunk and head extension with BUE abducted   Shoulder Instructions       General Comments      Pertinent Vitals/ Pain       Pain Assessment: Faces Faces Pain Scale: Hurts little more Pain Location: abdomen Pain Descriptors / Indicators: Discomfort Pain Intervention(s): Limited activity within patient's tolerance  Home Living                                          Prior Functioning/Environment              Frequency Min 3X/week     Progress Toward Goals  OT Goals(current goals can now be found in the care plan section)  Progress towards OT goals: Goals met. Goals upgraded and re-established  Acute Rehab OT Goals Patient Stated Goal: To walk around the nurse's station OT Goal Formulation: With patient Time For Goal Achievement: 06/08/16 Potential to Achieve Goals: Good ADL Goals Pt Will Perform Upper Body Bathing: sitting;with modified independence Pt Will Perform Lower Body Bathing: with set-up;with supervision;sit to/from stand;with adaptive equipment Pt Will Perform Upper Body Dressing: with modified independence;sitting Pt Will Perform Lower Body Dressing: with supervision;with set-up;sit to/from stand Pt Will Transfer to Toilet: with min guard assist;bedside commode;ambulating Pt Will Perform Toileting - Clothing Manipulation and hygiene: with min guard assist;sitting/lateral leans;sit to/from stand Pt/caregiver will Perform Home Exercise Program:  Increased strength;Increased ROM;With written HEP provided;With theraputty;With Supervision Additional ADL Goal #1: Pt will use BSC 5/7 days for toileting with min encouragement Additional ADL Goal #2: Pt will complete bed mobiilty with S in preparation for ADL Additional ADL Goal #3: Pt will complete check list for goals and re-establish goals weekly with min vc Additional ADL Goal #4: Complete bed mobility with S for ADL  Plan Discharge plan remains appropriate    Co-evaluation                 End of Session Equipment Utilized During Treatment: Gait belt;Rolling walker   Activity Tolerance Patient tolerated treatment well   Patient Left in chair;with call bell/phone within reach;with nursing/sitter in room   Nurse Communication Mobility status        Time: 1340-1430 OT Time Calculation (min): 50 min  Charges: OT General Charges $OT Visit: 1 Procedure OT Treatments $Self Care/Home Management : 38-52 mins  Traniya Prichett,HILLARY 06/02/2016, 3:11 PM   [facility], OT/L  [phone] 06/02/2016

## 2016-06-03 ENCOUNTER — Inpatient Hospital Stay (HOSPITAL_COMMUNITY)
Admission: RE | Admit: 2016-06-03 | Discharge: 2016-06-17 | DRG: 949 | Disposition: A | Discharge status: Home / Self Care | Payer: Medicaid Other | Source: Intra-hospital | Attending: Physical Medicine & Rehabilitation | Admitting: Physical Medicine & Rehabilitation

## 2016-06-03 DIAGNOSIS — D62 Acute posthemorrhagic anemia: Secondary | ICD-10-CM | POA: Diagnosis present

## 2016-06-03 DIAGNOSIS — F09 Unspecified mental disorder due to known physiological condition: Secondary | ICD-10-CM | POA: Diagnosis present

## 2016-06-03 DIAGNOSIS — Z79899 Other long term (current) drug therapy: Secondary | ICD-10-CM | POA: Diagnosis not present

## 2016-06-03 DIAGNOSIS — G8194 Hemiplegia, unspecified affecting left nondominant side: Secondary | ICD-10-CM | POA: Diagnosis present

## 2016-06-03 DIAGNOSIS — Z91410 Personal history of adult physical and sexual abuse: Secondary | ICD-10-CM | POA: Diagnosis not present

## 2016-06-03 DIAGNOSIS — S27329D Contusion of lung, unspecified, subsequent encounter: Secondary | ICD-10-CM | POA: Diagnosis not present

## 2016-06-03 DIAGNOSIS — I69354 Hemiplegia and hemiparesis following cerebral infarction affecting left non-dominant side: Secondary | ICD-10-CM | POA: Diagnosis present

## 2016-06-03 DIAGNOSIS — Z9889 Other specified postprocedural states: Secondary | ICD-10-CM

## 2016-06-03 DIAGNOSIS — Z915 Personal history of self-harm: Secondary | ICD-10-CM | POA: Diagnosis not present

## 2016-06-03 DIAGNOSIS — Z981 Arthrodesis status: Secondary | ICD-10-CM

## 2016-06-03 DIAGNOSIS — G40901 Epilepsy, unspecified, not intractable, with status epilepticus: Secondary | ICD-10-CM | POA: Diagnosis present

## 2016-06-03 DIAGNOSIS — S271XXD Traumatic hemothorax, subsequent encounter: Secondary | ICD-10-CM | POA: Diagnosis not present

## 2016-06-03 DIAGNOSIS — F4310 Post-traumatic stress disorder, unspecified: Secondary | ICD-10-CM | POA: Diagnosis present

## 2016-06-03 DIAGNOSIS — I6340 Cerebral infarction due to embolism of unspecified cerebral artery: Secondary | ICD-10-CM | POA: Diagnosis not present

## 2016-06-03 DIAGNOSIS — Z9189 Other specified personal risk factors, not elsewhere classified: Secondary | ICD-10-CM | POA: Diagnosis not present

## 2016-06-03 DIAGNOSIS — F332 Major depressive disorder, recurrent severe without psychotic features: Secondary | ICD-10-CM | POA: Diagnosis present

## 2016-06-03 DIAGNOSIS — S062X3D Diffuse traumatic brain injury with loss of consciousness of 1 hour to 5 hours 59 minutes, subsequent encounter: Principal | ICD-10-CM

## 2016-06-03 DIAGNOSIS — K5900 Constipation, unspecified: Secondary | ICD-10-CM | POA: Diagnosis present

## 2016-06-03 DIAGNOSIS — F988 Other specified behavioral and emotional disorders with onset usually occurring in childhood and adolescence: Secondary | ICD-10-CM | POA: Diagnosis present

## 2016-06-03 DIAGNOSIS — S2241XD Multiple fractures of ribs, right side, subsequent encounter for fracture with routine healing: Secondary | ICD-10-CM | POA: Diagnosis not present

## 2016-06-03 DIAGNOSIS — S069X3S Unspecified intracranial injury with loss of consciousness of 1 hour to 5 hours 59 minutes, sequela: Secondary | ICD-10-CM | POA: Diagnosis not present

## 2016-06-03 DIAGNOSIS — R4189 Other symptoms and signs involving cognitive functions and awareness: Secondary | ICD-10-CM | POA: Diagnosis present

## 2016-06-03 DIAGNOSIS — S069X3A Unspecified intracranial injury with loss of consciousness of 1 hour to 5 hours 59 minutes, initial encounter: Secondary | ICD-10-CM | POA: Diagnosis present

## 2016-06-03 DIAGNOSIS — T1491XA Suicide attempt, initial encounter: Secondary | ICD-10-CM | POA: Diagnosis present

## 2016-06-03 DIAGNOSIS — I63412 Cerebral infarction due to embolism of left middle cerebral artery: Secondary | ICD-10-CM | POA: Diagnosis not present

## 2016-06-03 DIAGNOSIS — Z9151 Personal history of suicidal behavior: Secondary | ICD-10-CM

## 2016-06-03 MED ORDER — DIPHENHYDRAMINE HCL 25 MG PO CAPS: 25.0000 mg | ORAL_CAPSULE | Freq: Three times a day (TID) | ORAL | Status: DC | PRN

## 2016-06-03 MED ORDER — ONDANSETRON HCL 4 MG PO TABS: 4.0000 mg | ORAL_TABLET | Freq: Four times a day (QID) | ORAL | Status: DC | PRN

## 2016-06-03 MED ORDER — ATORVASTATIN CALCIUM 20 MG PO TABS
20.0000 mg | ORAL_TABLET | Freq: Every day | ORAL | Status: DC
  Administered 2016-06-04 – 2016-06-16 (×13): 20 mg via ORAL
  Filled 2016-06-03 (×14): qty 1

## 2016-06-03 MED ORDER — ONDANSETRON HCL 4 MG/2ML IJ SOLN: 4.0000 mg | Freq: Four times a day (QID) | INTRAMUSCULAR | Status: DC | PRN

## 2016-06-03 MED ORDER — FERROUS GLUCONATE 324 (38 FE) MG PO TABS
324.0000 mg | ORAL_TABLET | Freq: Two times a day (BID) | ORAL | Status: DC
  Administered 2016-06-04 – 2016-06-17 (×22): 324 mg via ORAL
  Filled 2016-06-03 (×29): qty 1

## 2016-06-03 MED ORDER — ADULT MULTIVITAMIN W/MINERALS CH
1.0000 | ORAL_TABLET | Freq: Every day | ORAL | Status: DC
  Administered 2016-06-04 – 2016-06-17 (×14): 1 via ORAL
  Filled 2016-06-03 (×14): qty 1

## 2016-06-03 MED ORDER — PHENOL 1.4 % MT LIQD: 1.0000 | OROMUCOSAL | Status: DC | PRN

## 2016-06-03 MED ORDER — ASPIRIN EC 81 MG PO TBEC
81.0000 mg | DELAYED_RELEASE_TABLET | Freq: Every day | ORAL | Status: DC
  Administered 2016-06-04 – 2016-06-17 (×14): 81 mg via ORAL
  Filled 2016-06-03 (×14): qty 1

## 2016-06-03 MED ORDER — LEVALBUTEROL HCL 0.63 MG/3ML IN NEBU: 0.6300 mg | INHALATION_SOLUTION | Freq: Four times a day (QID) | RESPIRATORY_TRACT | Status: DC | PRN

## 2016-06-03 MED ORDER — BACITRACIN ZINC 500 UNIT/GM EX OINT
TOPICAL_OINTMENT | Freq: Two times a day (BID) | CUTANEOUS | Status: DC
  Administered 2016-06-03 – 2016-06-09 (×12): via TOPICAL
  Administered 2016-06-11: 1 via TOPICAL
  Administered 2016-06-12: 18:00:00 via TOPICAL
  Administered 2016-06-13: 1 via TOPICAL
  Administered 2016-06-13 – 2016-06-17 (×6): via TOPICAL
  Filled 2016-06-03 (×4): qty 28.35

## 2016-06-03 MED ORDER — ALUM & MAG HYDROXIDE-SIMETH 200-200-20 MG/5ML PO SUSP: 30.0000 mL | ORAL | Status: DC | PRN

## 2016-06-03 MED ORDER — FLEET ENEMA 7-19 GM/118ML RE ENEM: 1.0000 | ENEMA | Freq: Every day | RECTAL | Status: DC | PRN

## 2016-06-03 MED ORDER — SENNOSIDES-DOCUSATE SODIUM 8.6-50 MG PO TABS
2.0000 | ORAL_TABLET | Freq: Every day | ORAL | Status: DC
  Administered 2016-06-03 – 2016-06-05 (×3): 2 via ORAL
  Filled 2016-06-03 (×3): qty 2

## 2016-06-03 MED ORDER — CLONAZEPAM 0.5 MG PO TABS
0.5000 mg | ORAL_TABLET | Freq: Every day | ORAL | Status: DC
  Administered 2016-06-03 – 2016-06-16 (×13): 0.5 mg via ORAL
  Filled 2016-06-03 (×14): qty 1

## 2016-06-03 MED ORDER — IPRATROPIUM-ALBUTEROL 0.5-2.5 (3) MG/3ML IN SOLN: 3.0000 mL | Freq: Four times a day (QID) | RESPIRATORY_TRACT | Status: DC | PRN

## 2016-06-03 MED ORDER — LACOSAMIDE 50 MG PO TABS
100.0000 mg | ORAL_TABLET | Freq: Two times a day (BID) | ORAL | Status: DC
  Administered 2016-06-03 – 2016-06-17 (×28): 100 mg via ORAL
  Filled 2016-06-03 (×28): qty 2

## 2016-06-03 MED ORDER — METHOCARBAMOL 500 MG PO TABS
500.0000 mg | ORAL_TABLET | Freq: Four times a day (QID) | ORAL | Status: DC | PRN
  Administered 2016-06-04 – 2016-06-14 (×9): 500 mg via ORAL
  Filled 2016-06-03 (×13): qty 1

## 2016-06-03 MED ORDER — OXYCODONE HCL 5 MG PO TABS
5.0000 mg | ORAL_TABLET | ORAL | Status: DC | PRN
  Administered 2016-06-04 (×2): 5 mg via ORAL
  Administered 2016-06-04 – 2016-06-05 (×2): 10 mg via ORAL
  Filled 2016-06-03: qty 1
  Filled 2016-06-03 (×4): qty 2

## 2016-06-03 MED ORDER — SIMETHICONE 80 MG PO CHEW: 80.0000 mg | CHEWABLE_TABLET | Freq: Four times a day (QID) | ORAL | Status: DC | PRN

## 2016-06-03 MED ORDER — SERTRALINE HCL 100 MG PO TABS
100.0000 mg | ORAL_TABLET | Freq: Every day | ORAL | Status: DC
  Administered 2016-06-04 – 2016-06-17 (×14): 100 mg via ORAL
  Filled 2016-06-03 (×14): qty 1

## 2016-06-03 MED ORDER — ENOXAPARIN SODIUM 40 MG/0.4ML ~~LOC~~ SOLN
40.0000 mg | SUBCUTANEOUS | Status: DC
  Administered 2016-06-04 – 2016-06-16 (×13): 40 mg via SUBCUTANEOUS
  Filled 2016-06-03 (×13): qty 0.4

## 2016-06-03 MED ORDER — GUAIFENESIN-DM 100-10 MG/5ML PO SYRP: 5.0000 mL | ORAL_SOLUTION | Freq: Four times a day (QID) | ORAL | Status: DC | PRN

## 2016-06-03 MED ORDER — BISACODYL 10 MG RE SUPP
10.0000 mg | Freq: Every day | RECTAL | Status: DC | PRN
Start: 2016-06-03 — End: 2016-06-17
  Filled 2016-06-03: qty 1

## 2016-06-03 NOTE — Progress Notes (Signed)
Trauma Service Note  Subjective: Patient doing quite well.  Great spirits.    Objective: Vital signs in last 24 hours: Temp:  [98.4 F (36.9 C)] 98.4 F (36.9 C) (07/27 0488) Pulse Rate:  [72-75] 72 (07/27 0638) Resp:  [18] 18 (07/27 0638) BP: (117-123)/(70-78) 123/78 (07/27 0638) SpO2:  [98 %-100 %] 98 % (07/27 8916) Weight:  [79.5 kg (175 lb 3.2 oz)] 79.5 kg (175 lb 3.2 oz) (07/27 9450) Last BM Date: 05/28/16  Intake/Output from previous day: 07/26 0701 - 07/27 0700 In: 700 [P.O.:700] Out: -  Intake/Output this shift: Total I/O In: 360 [P.O.:360] Out: -   General: No acute distress.  Has walked to the nurse's nesk  Lungs: Clear  Abd: Soft, Excellent breath sounds.  Extremities: No changes  Neuro: Intact  Lab Results: CBC  No results for input(s): WBC, HGB, HCT, PLT in the last 72 hours. BMET No results for input(s): NA, K, CL, CO2, GLUCOSE, BUN, CREATININE, CALCIUM in the last 72 hours. PT/INR No results for input(s): LABPROT, INR in the last 72 hours. ABG No results for input(s): PHART, HCO3 in the last 72 hours.  Invalid input(s): PCO2, PO2  Studies/Results: No results found.  Anti-infectives: Anti-infectives    Start     Dose/Rate Route Frequency Ordered Stop   05/15/16 1430  cefUROXime (ZINACEF) 1.5 g in dextrose 5 % 50 mL IVPB     1.5 g 100 mL/hr over 30 Minutes Intravenous Every 12 hours 05/15/16 1042 05/17/16 0255   05/14/16 1400  cefUROXime (ZINACEF) 1.5 g in dextrose 5 % 50 mL IVPB     1.5 g 100 mL/hr over 30 Minutes Intravenous Every 12 hours 05/14/16 1255 05/15/16 0300   05/10/16 1230  sulfamethoxazole-trimethoprim (BACTRIM DS,SEPTRA DS) 800-160 MG per tablet 1 tablet  Status:  Discontinued     1 tablet Oral Every 12 hours 05/10/16 1122 05/21/16 0742   05/07/16 1300  ciprofloxacin (CIPRO) tablet 500 mg  Status:  Discontinued     500 mg Oral 2 times daily 05/07/16 1200 05/10/16 1122   05/06/16 1700  ceFEPIme (MAXIPIME) 2 g in dextrose 5 % 50  mL IVPB  Status:  Discontinued     2 g 100 mL/hr over 30 Minutes Intravenous Every 8 hours 05/06/16 1316 05/07/16 1200   04/27/16 1000  ceFEPIme (MAXIPIME) 2 g in dextrose 5 % 50 mL IVPB  Status:  Discontinued     2 g 100 mL/hr over 30 Minutes Intravenous Every 12 hours 04/27/16 0818 05/06/16 1316   04/22/16 1200  piperacillin-tazobactam (ZOSYN) IVPB 3.375 g  Status:  Discontinued     3.375 g 12.5 mL/hr over 240 Minutes Intravenous Every 6 hours 04/22/16 1039 04/22/16 1042   04/22/16 1200  vancomycin (VANCOCIN) 1,500 mg in sodium chloride 0.9 % 500 mL IVPB  Status:  Discontinued     1,500 mg 250 mL/hr over 120 Minutes Intravenous Every 12 hours 04/22/16 1133 04/28/16 0115   04/22/16 1130  piperacillin-tazobactam (ZOSYN) IVPB 3.375 g  Status:  Discontinued     3.375 g 12.5 mL/hr over 240 Minutes Intravenous Every 8 hours 04/22/16 1043 04/27/16 0754   04/02/16 1200  fluconazole (DIFLUCAN) 40 MG/ML suspension 400 mg     400 mg Per Tube  Once 04/02/16 1026 04/02/16 1442   03/31/16 1000  sulfamethoxazole-trimethoprim (BACTRIM,SEPTRA) 200-40 MG/5ML suspension 40 mL  Status:  Discontinued     40 mL Per Tube Every 12 hours 03/31/16 0927 03/31/16 0956   03/31/16 1000  sulfamethoxazole-trimethoprim (BACTRIM,SEPTRA) 200-40 MG/5ML suspension 20 mL     20 mL Per Tube Every 6 hours 03/31/16 0956 04/03/16 2359   03/26/16 1030  cefTRIAXone (ROCEPHIN) 2 g in dextrose 5 % 50 mL IVPB  Status:  Discontinued     2 g 100 mL/hr over 30 Minutes Intravenous Every 24 hours 03/26/16 1023 03/31/16 0921   03/25/16 1000  ceFEPIme (MAXIPIME) 2 g in dextrose 5 % 50 mL IVPB  Status:  Discontinued     2 g 100 mL/hr over 30 Minutes Intravenous Every 12 hours 03/25/16 0829 03/26/16 1023   03/08/16 1400  levofloxacin (LEVAQUIN) IVPB 750 mg     750 mg 100 mL/hr over 90 Minutes Intravenous Every 24 hours 03/08/16 0912 03/10/16 1501   03/06/16 1400  ceFEPIme (MAXIPIME) 2 g in dextrose 5 % 50 mL IVPB  Status:  Discontinued      2 g 100 mL/hr over 30 Minutes Intravenous Every 12 hours 03/06/16 1344 03/08/16 0912   03/01/16 1330  ceFAZolin (ANCEF) IVPB 2g/100 mL premix     2 g 200 mL/hr over 30 Minutes Intravenous To [facility] 02/29/16 0917 03/01/16 1452   02/11/16 0600  piperacillin-tazobactam (ZOSYN) IVPB 3.375 g  Status:  Discontinued     3.375 g 100 mL/hr over 30 Minutes Intravenous 4 times per day 02/10/16 2214 02/22/16 0853   02/10/16 1730  piperacillin-tazobactam (ZOSYN) IVPB 3.375 g  Status:  Discontinued     3.375 g 12.5 mL/hr over 240 Minutes Intravenous 3 times per day 02/10/16 1727 02/10/16 2214      Assessment/Plan: s/p Procedure(s): RIGHT VIDEO ASSISTED THORACOSCOPY (VATS),DRAINAGE OF EMPYEMA TRANSESOPHAGEAL ECHOCARDIOGRAM (TEE) EVACUATION HEMATOMA Awaiting decision on disposition.  LOS: 115 days   Marta Lamas. Gae Bon, MD, FACS [phone] Trauma Surgeon 06/03/2016
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
## 2016-06-03 NOTE — H&P (Signed)
Physical Medicine and Rehabilitation Admission H&P       Chief Complaint  Patient presents with  . Polytrauma    HPI: [name] Bell a 35 y.o.femalewho was admitted on 02/09/16 after suicide attempt. She drank drano, jumped out of 5th floor hotel room and left DNR note. She sustained multiple injuries including right rib fractures with HPTX treated with chest tube, Pelvic fracture requiring SI pinning 4/10, multiple abdominal procedures including cholecystectomy, bowel resection and wound VAC and closure by secondary intention. Multiple left wrist fractures splinted and WBAT per Ddr. Melvyn Novas, tracheostomy for VDRF and PEG placement of nutritional support. She developed empyema requiring thoracotomy for drainage on 06/20 by Dr. Laneta Simmers. She tolerated extubation and decannulation without difficulty. Psychiatry following for input and medication management--for transfer to [facility] once chest tubes removed. 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 CT venogram without evidence of cortical or sagittal sinus thrombus. She developed new onset seizure on 07/05 most likely due to hemorrhagic transformation and was loaded with Vimpat. TEE 7/7 negative for vegetation or thrombus. She developed recurrent right hemothorax requiring right thoracotomy with drainage on 07/07.   Neurology following for input and repeat MRI brain 7/13 showed resolution of right frontal diffusion abnormality suggestive of late subacute to early chronic hemorrhage of unclear etiology. Dr. Pearlean Brownie questioned delayed hemorrhagic contusion from fall v/s cryptogenic infarct of unknown cause and recommends ASA once bloody pleural effusion resolves and H/H stable. CT removed on 07/17 and follow up CXR showed good  resolution. Oral intake improving. She is WBAT BUE/BLE   Per Dr. Elsie Saas "patient at chronically elevated risk for self harm; risk including previous suicide attempt, impulsivity, chronic pain, stroke, depression, history of trauma." He recommends continuing sitter and current medication regimen. Patient meets criteria for inpatient psychiatric hospitalization and involuntary commitment paper have been completed.   Patient prefers to discharge home with sister and her sister has been unhappy with current psychiatric recommendations. Both refuse  IVC to central hospital as they feel that this would be detrimental and prefer rehab followed by discharge to home where sister will provide supervision. She continues to be limited by LLE weakness with foot drop and requires assistance with ADL tasks.  Therapy ongoing and CIR recommended for follow up therapy.     Review of Systems  Constitutional: Negative for chills and fever.  HENT: Negative for hearing loss.   Eyes: Negative for blurred vision and double vision.  Respiratory: Negative for cough and shortness of breath.   Cardiovascular: Negative for chest pain and palpitations.  Gastrointestinal: Positive for constipation. Negative for abdominal pain and heartburn.  Genitourinary: Negative for frequency and urgency.  Musculoskeletal: Negative for back pain, joint pain and myalgias.  Skin: Negative for itching and rash.  Neurological: Negative for dizziness, tingling, focal weakness and headaches.  Psychiatric/Behavioral: Negative for depression. The patient is not nervous/anxious and does not have insomnia.           Past Medical History:  Diagnosis Date  . ADD (attention deficit disorder)    has been on medication  . Anemia   . Anxiety   . Depression   . H/O suicide attempt    at age 32.   Marland Kitchen PTSD (post-traumatic stress disorder)          Past Surgical History:  Procedure Laterality  Date  . APPLICATION OF  WOUND VAC  02/09/2016   Procedure: APPLICATION OF WOUND VAC;  Surgeon: Jimmye Norman, MD;  Location: [facility] OR;  Service: General;;  . APPLICATION OF WOUND VAC N/A 02/16/2016   Procedure: RE-APPLICATION OF WOUND VAC;  Surgeon: [name], MD;  Location: [facility] OR;  Service: General;  Laterality: N/A;  . BOWEL RESECTION  02/09/2016   Procedure: SMALL BOWEL RESECTION, MESENTERIC REPAIR;  Surgeon: Jimmye Norman, MD;  Location: [facility] OR;  Service: General;;  . CHEST TUBE INSERTION Right 02/11/2016   Procedure: CHEST TUBE INSERTION;  Surgeon: Jimmye Norman, MD;  Location: [facility] OR;  Service: General;  Laterality: Right;  . CHEST TUBE INSERTION Right 02/26/2016   Procedure: CHEST TUBE INSERTION;  Surgeon: Kerin Perna, MD;  Location[facility] OR;  Service: Thoracic;  Laterality: Right;  . CHOLECYSTECTOMY  02/09/2016   Procedure: CHOLECYSTECTOMY;  Surgeon: Jimmye Norman, MD;  Location: [facility] OR;  Service: General;;  . ESOPHAGOGASTRODUODENOSCOPY N/A 02/10/2016   Procedure: ESOPHAGOGASTRODUODENOSCOPY (EGD);  Surgeon: Sherrilyn Rist, MD;  Location: [facility] ENDOSCOPY;  Service: Endoscopy;  Laterality: N/A;  . EXTERNAL FIXATION PELVIS  02/09/2016   Procedure: EXTERNAL FIXATION PELVIS;  Surgeon: Myrene Galas, MD;  Location: [facility] OR;  Service: Orthopedics;;  . HEMATOMA EVACUATION Right 05/14/2016   Procedure: EVACUATION HEMATOMA;  Surgeon: Alleen Borne, MD;  Location: [facility] OR;  Service: Thoracic;  Laterality: Right;  Evacuation of Hematoma Right Chest  . LACERATION REPAIR  02/09/2016   Procedure: REPAIR LIVER LACERATION;  Surgeon: Jimmye Norman, MD;  Location: [facility] OR;  Service: General;;  . LAPAROTOMY N/A 02/10/2016   Procedure: EXPLORATORY LAPAROTOMY, removal of packs,  cauterization of liver, repacking of liver, and open abdomen vac application;  Surgeon: [name], MD;  Location: [facility] OR;  Service: General;  Laterality: N/A;  . LAPAROTOMY N/A 02/11/2016   Procedure: EXPLORATORY LAPAROTOMY VAC CHANGE ;  Surgeon: Jimmye Norman, MD;   Location: [facility] OR;  Service: General;  Laterality: N/A;  . LAPAROTOMY N/A 02/13/2016   Procedure: EXPLORATORY LAPAROTOMY, REMOVAL OF PACKS, ABDOMINAL VAC DRESSING CHANGE;  Surgeon: [name], MD;  Location: [facility] OR;  Service: General;  Laterality: N/A;  . LAPAROTOMY N/A 02/18/2016   Procedure: EXPLORATORY LAPAROTOMY, PLACEMENT OF ABRA ABDOMINAL WALL CLOSURE SET;  Surgeon: Jimmye Norman, MD;  Location: [facility] OR;  Service: General;  Laterality: N/A;  . LAPAROTOMY N/A 02/09/2016   Procedure: EXPLORATORY LAPAROTOMY;  Surgeon: Jimmye Norman, MD;  Location: [facility] OR;  Service: General;  Laterality: N/A;  . LAPAROTOMY N/A 02/16/2016   Procedure: EXPLORATORY LAPAROTOMY, ABDOMINAL WASH OUT;  Surgeon: [name], MD;  Location: [facility] OR;  Service: General;  Laterality: N/A;  . PERCUTANEOUS TRACHEOSTOMY N/A 03/01/2016   Procedure: PERCUTANEOUS TRACHEOSTOMY;  Surgeon: Jimmye Norman, MD;  Location: [facility] OR;  Service: General;  Laterality: N/A;  . SACRO-ILIAC PINNING Right 02/16/2016   Procedure: Loyal Gambler;  Surgeon: Myrene Galas, MD;  Location: [facility] OR;  Service: Orthopedics;  Laterality: Right;  . TEE WITHOUT CARDIOVERSION N/A 05/14/2016   Procedure: TRANSESOPHAGEAL ECHOCARDIOGRAM (TEE);  Surgeon: Alleen Borne, MD;  Location: [facility] OR;  Service: Thoracic;  Laterality: N/A;  . THORACOTOMY/LOBECTOMY Right 04/26/2016   Procedure: Right THORACOTOMY AND DRAINAGE OF EMPYEMA;  Surgeon: Alleen Borne, MD;  Location: [facility] OR;  Service: Thoracic;  Laterality: Right;  Marland Kitchen VIDEO ASSISTED THORACOSCOPY (VATS)/THOROCOTOMY Right 05/14/2016   Procedure: RIGHT VIDEO ASSISTED THORACOSCOPY (VATS),DRAINAGE OF EMPYEMA;  Surgeon: Alleen Borne, MD;  Location: [facility] OR;  Service: Thoracic;  Laterality: Right;  . WOUND DEBRIDEMENT N/A 03/01/2016   Procedure: ABDOMINAL WOUND CLOSURE;  Surgeon: Jimmye Norman, MD;  Location: [facility] OR;  Service: General;  Laterality: N/A;         Family History  Problem Relation Age of Onset  . Hypertension  Father   . Mental illness Mother   . Stroke Maternal Aunt   . Hypertension Sister   . Fibroids Sister     Social History:  reports that she has never smoked. She does not have any smokeless tobacco history on file. Her alcohol and drug histories are not on file.    Allergies: No Known Allergies    No prescriptions prior to admission.    Home: Home Living Family/patient expects to be discharged to:: Unsure Living Arrangements: Spouse/significant other Additional Comments: pt non-verbal unable to report where she lives, per chart pt recently located to [place] 3 weeks ago   Functional History: Prior Function Level of Independence: Independent  Functional Status:  Mobility: Bed Mobility Overal bed mobility: Needs Assistance Bed Mobility: Supine to Sit Rolling: Min assist Sidelying to sit: Mod assist Supine to sit: HOB elevated, Supervision Sit to supine: +2 for physical assistance, Mod assist Sit to sidelying: Mod assist General bed mobility comments: Pt has been OOB in recliner chair now for 2 hours and stayed in recliner chair at the end of my session.  Transfers Overall transfer level: Needs assistance Equipment used: Rolling walker (2 wheeled) Transfer via Lift Equipment: Stedy Transfers: Sit to/from Stand Sit to Stand: Min assist Stand pivot transfers: Min assist Squat pivot transfers: Total assist, +2 physical assistance (+2 with increased assist after fatigue.  ) General transfer comment: Min assist to support pt at hips to boost up from lower chair.  Verbal cues for hand placement and to scoot out first before attempting to stand from so deep in the chair.  Significant reliance on hands for transitions. Ambulation/Gait Ambulation/Gait assistance: Min assist, +2 safety/equipment Ambulation Distance (Feet): 210 Feet (with one seated rest break) Assistive device: Rolling walker (2 wheeled) Gait Pattern/deviations: Step-through pattern, Decreased  dorsiflexion - left, Decreased weight shift to left, Steppage, Trunk flexed General Gait Details: Pt with L leg steppage gait pattern due to decreased DF strength in left foot and ankle.  After seated rest break PT applied L foot DF assist ace wrapping to left lower leg and pt with increased speed and efficiency during gait and less external rotation at the lower leg as she fatigued due to better left foot clearance.  Min assist to support trunk for safety and chair to follow to encourage increased safe gait distance.  Pt was able to correct/steer RW today.   Gait velocity: decreased  Gait velocity interpretation: Below normal speed for age/gender Wheelchair Mobility Wheelchair mobility: Yes Wheelchair propulsion: Both lower extermities Wheelchair parts: Other (comment) (did not review WC parts.  ) Distance: 300 ft with supervision and intermittent moments of min assist.  Pt required x2 rest breaks due to fatigue.  Performed WC mobility without O2 and on RA with PMV 96%.  Pt required cues for obstacle negotiation and min-supervision for management of lines and leads.  Remains with  IV, chest tube on R and trach.   Wheelchair Assistance Details (indicate cue type and reason): Required assistance at end of trial.  Pt left sitting outside window with sitter present.    ADL: ADL Overall ADL's : Needs assistance/impaired Eating/Feeding: Independent Eating/Feeding Details (indicate cue type and reason): Pt self fed finger foods  and drank from straw with R hand, used L hand to reach inside bag for pretzels. Grooming: Wash/dry hands, Wash/dry face, Oral care, Applying deodorant, Set up Grooming Details (indicate cue type and reason): pt up in recliner. Pt instructed to scoot hips forward in recliner to sit unsupported. Pt c/o of abdominal pain after approximately 20 second and began scooting hips back in chair Upper Body Bathing: Set up Upper Body Bathing Details (indicate cue type and reason): EOB with  increased encouragement  Lower Body Bathing: Minimal assistance, Sitting/lateral leans (circle sitting in bed) Lower Body Bathing Details (indicate cue type and reason): bathing at bed level with use of long handled sponge. Educated pt on use of bed positionoing/use of bed controls.to help increase her independence with bathing Upper Body Dressing : Set up, Sitting Lower Body Dressing: Bed level, Set up Lower Body Dressing Details (indicate cue type and reason): donning socks. Pt has not attempted pants/panties yet Toilet Transfer: Moderate assistance, Ambulation, BSC, RW Toilet Transfer Details (indicate cue type and reason): did not occur Toileting- Clothing Manipulation and Hygiene: Moderate assistance, Sit to/from stand Toileting - Clothing Manipulation Details (indicate cue type and reason): Pt wearing brief today. Discussed goal of using BSC and discontinuing use of brief. Pt expressed her concerns of having difficulty having a BM and urinary delay. Encouraged pt to discuss concerns with her MD. Functional mobility during ADLs: Rolling walker, Minimal assistance (took a few steps and pivoted to chair ) General ADL Comments: Focus of session involved ambulating to [facility] at sink. Using RUE to complete pericare. REquried A to complete and for L buttock.   Cognition: Cognition Overall Cognitive Status: Impaired/Different from baseline Arousal/Alertness: Lethargic Orientation Level: Oriented X4 Attention: Sustained Sustained Attention: Impaired Sustained Attention Impairment: Functional basic Problem Solving: Impaired Problem Solving Impairment: Functional basic Cognition Arousal/Alertness: Awake/alert Behavior During Therapy: WFL for tasks assessed/performed Overall Cognitive Status: Impaired/Different from baseline Area of Impairment: Attention, Memory Orientation Level: Time, Disoriented to Current Attention Level: Selective Memory: Decreased short-term memory Following Commands:  Follows one step commands consistently, Follows multi-step commands with increased time Safety/Judgement: Decreased awareness of safety, Decreased awareness of deficits Awareness: Emergent Problem Solving: Slow processing General Comments: Continues to demonstrate improved cognition daily with increased insight into medical situation and discharge disposition Difficult to assess due to: Impaired communication   Blood pressure 123/78, pulse 72, temperature 98.4 F (36.9 C), temperature source Oral, resp. rate 18, height 5\' 11"  (1.803 m), weight 79.5 kg (175 lb 3.2 oz), SpO2 98 %. Physical Exam  Nursing note and vitals reviewed. Constitutional: She is oriented to person, place, and time. She appears well-developed and well-nourished.  HENT:  Head: Normocephalic and atraumatic.  Mouth/Throat: Oropharynx is clear and moist.  Eyes: Conjunctivae are normal. Pupils are equal, round, and reactive to light. Right eye exhibits no discharge. No scleral icterus.  Neck: Normal range of motion. Neck supple.  Cardiovascular: Regular rhythm.  Tachycardia present.   Murmur heard. Respiratory: Effort normal and breath sounds normal. No stridor. No respiratory distress. She has no wheezes.  Right lateral chest incision has healed well. Small dime size pink area at prior chest tube site.   GI: Soft. Bowel sounds are normal. She exhibits no distension. There is no tenderness.  Midline incision healing well--superficial with beefy red tissue. Healed scars noted from retention sutures.   Musculoskeletal: She exhibits edema.  Neurological: She is alert and oriented to person, place, and time.  Speech clear. Able to follow commands without difficulty. LLE  with sustained clonus.  Skin: Skin is warm and dry. No rash noted. No erythema.  Psychiatric: Her behavior is normal. Thought content normal.  Almost euphoric today.   Motor strength: 5-/5 in the right deltoid, biceps, triceps, grip, 4/5  hip flexor, knee  extensor, ankle dorsiflexor. 3+ left deltoid, biceps, triceps, grip 3/5, left hip flexor, 0 at the knee extensor, 2 minus, left hip extensor, 0 at the ankle dorsiflexor and plantar flexor. Sensation mildly reduced to light touch in the left versus right upper and lower limb No evidence of facial droop   Lab Results Last 48 Hours  No results found for this or any previous visit (from the past 48 hour(s)).   Imaging Results (Last 48 hours)  No results found.       Medical Problem List and Plan: 1. 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  DVT Prophylaxis/Anticoagulation: Pharmaceutical: Lovenox 3. Pain Management: tylenol prn 4. Mood: Patient denies any anxiety, depressive symptoms, insomnia, nightmares or intent for self harm.  Psychiatry to follow. LCSW to follow for support.  5. Neuropsych: This patient is not fully capable of making decisions on her own behalf. 6. Skin/Wound Care: Monitor wound for healing.  7. Fluids/Electrolytes/Nutrition: Monitor by mouth intake, follow-up on bmet 8. Abdominal procedures with bowel resection: Po intake has been good. Continue bid dressing change to midline incision. 9. TBI/R-MCA infarct with DAI with hemorrhage: Will add ASA--chest tube out and H/H stable.   10 New onset seizures:  Controlled on vimpat bid. 11. Empyema RLL s/p VATS: Resolved. Respiratory status stable.  12. Constipation: refusing miralax--will change to Senna S. . 13   Depression: Mood appears stable on Zoloft daily and on Klonopin at bedtime.  14. ABLA: on iron supplement.    Post Admission Physician Evaluation: Functional deficits secondary  to  right MCA infarct and traumatic brain injury 1. . 2. Patient is admitted to receive collaborative, interdisciplinary care between the physiatrist, rehab nursing staff, and therapy team. 3. Patient's level of medical complexity and substantial therapy needs in  context of that medical necessity cannot be provided at a lesser intensity of care such as a SNF. 4. Patient has experienced substantial functional loss from his/her baseline which was documented above under the "Functional History" and "Functional Status" headings.  Judging by the patient's diagnosis, physical exam, and functional history, the patient has potential for functional progress which will result in measurable gains while on inpatient rehab.  These gains will be of substantial and practical use upon discharge  in facilitating mobility and self-care at the household level. 5. Physiatrist will provide 24 hour management of medical needs as well as oversight of the therapy plan/treatment and provide guidance as appropriate regarding the interaction of the two. 6. 24 hour rehab nursing will assist with bladder management, bowel management, safety, skin/wound care, disease management, medication administration, pain management, patient education and monitor mood and safety   and help integrate therapy concepts, techniques,education, etc. 7. PT will assess and treat for/with: pre gait, gait training, endurance , safety, equipment, neuromuscular re education.   Goals are: Min assist. 8. OT will assess and treat for/with: ADLs, Cognitive perceptual skills, Neuromuscular re education, safety, endurance, equipment.   Goals are: Min assist lower, and modified independent. Upper ADLs. Therapy may not proceed with showering this patient. 9. SLP will assess and treat for/with: Cognition.  Goals are: Modified independent medication management. 10. Case Management and Social Worker will  assess and treat for psychological issues and discharge planning. 11. Team conference will be held weekly to assess progress toward goals and to determine barriers to discharge. 12. Patient will receive at least 3 hours of therapy per day at least 5 days per week. 13. ELOS: 14-17 days       14. Prognosis:   good     Erick Colace M.D. [facility] FAAPM&R (Sports Med, Neuromuscular Med) Diplomate Am Board of Electrodiagnostic Med  06/03/2016

## 2016-06-03 NOTE — Progress Notes (Signed)
Pt refused to wear Prafo after having on for about 5 mins, pt statesit is too hot to wear. Merdis Delay, RN

## 2016-06-03 NOTE — Care Management Note (Signed)
Case Management Note  Patient Details  Name: [name] Bell MRN: [id number] Date of Birth: 03-09-1981  Subjective/Objective:   Pt medically stable for dc today; accepted to CIR.                   Action/Plan: Plan dc to CIR later today.    Expected Discharge Date:   06/03/16               Expected Discharge Plan:  IP Rehab Facility  In-House Referral:  Clinical Social Work  Discharge planning Services  CM Consult  Post Acute Care Choice:    Choice offered to:     DME Arranged:    DME Agency:     HH Arranged:    HH Agency:     Status of Service:  Completed, signed off  If discussed at [employer], dates discussed:    Additional Comments:  Quintella Baton, RN, BSN  Trauma/Neuro ICU Case Manager [phone]

## 2016-06-03 NOTE — Clinical Social Work Note (Signed)
LCSW received call from [facility] requesting patient's therapy notes and MD/RN notes from the dates 05/26/2016-06/03/2016. LCSW obtained clinical documentation and has provided documentation to [facility]. LCSW confirmed with [facility] that documentation was received.  LCSW to continue to follow and provide [facility] with appropriate requested documentation as needed. CSW Interior and spatial designer, Wellsite geologist, and RNCM updated regarding information above.  Marcelline Deist, LCSW [phone] Orthopedics: [phone] Surgical: [phone]

## 2016-06-03 NOTE — Progress Notes (Addendum)
Physical Therapy Treatment Patient Details Name: [name] Bell MRN: [id number] DOB: 11-Mar-1981 Today's Date: 06/03/2016    History of Present Illness [name] Bell is a 35 y.o. female admitted to [facility] on 02/09/16 s/p attempted suicide: taped DNR note to chest, drank Drano, then jumped off 5 story building ([address]).S/p 02/09/16 gel foam embolization of bil internal iliac arteries.S/p 02/09/16 ex lap with cholecystectomy, resection SB and mesenteric repair, liver lac repair, application or wound vac.S/p 02/09/16 closed reduction of pelvic ring, external pelvic fixation.Also has right metacarpal fracture in splint. Pt on and off intubation via trach/ and on/off trac collar.  Pelvic x- fixator pins removed at bedside on 03/23/16.  Underwent thoracotomy 6/19 due to empyema/abcess with 2 chest tubes, one d/c 04/30/16, second d/c 05/05/16 and pt no longer has trach.  Pt with no significant PMHx listed in the chart. Pt had acute speech and left sided weakness changes on7/3/17 and both CT and MRI confirmed stroke (R frontoparietal).  Thoracotomy and evac of hematoma 05/14/16.      PT Comments    Pt is progressing well with mobility, striving to do more on her own without my physical assistance.  We walked with shoes on today over carpet and uneven flooring in the solarium, which was extra effortful and fatiguing for [name].  We did DF wrap her left foot to help with foot clearance during gait.  She is scheduled to d/c to CIR this afternoon.      Follow Up Recommendations  CIR     Equipment Recommendations  Rolling walker with 5" wheels;Wheelchair (measurements PT);Wheelchair cushion (measurements PT);3in1 (PT)    Recommendations for Other Services Rehab consult     Precautions / Restrictions Precautions Precautions: Fall Precaution Comments: left sided weakness Required Braces or Orthoses: Other Brace/Splint Other Brace/Splint: L PRAFO Restrictions RUE Weight Bearing: Weight bearing as  tolerated LUE Weight Bearing: Weight bearing as tolerated RLE Weight Bearing: Weight bearing as tolerated LLE Weight Bearing: Weight bearing as tolerated    Mobility  Bed Mobility Overal bed mobility: Needs Assistance Bed Mobility: Rolling;Sidelying to Sit;Sit to Sidelying Rolling: Supervision;Min assist (supervision to L with rail, min assit to right with rail) Sidelying to sit: Supervision;HOB elevated (HOB 36 degrees)     Sit to sidelying: Mod assist;HOB elevated General bed mobility comments: Rolling to the left weaker side is easy for [name], rolling to the right requires some management of her left leg and pelvis to get all the way to sidelying.  From left sidelying she is supervision with quite a bit of effort and extra time to get to sitting with HOB elevated, and from sit to left sidelying pt needs mod assist to help lift both legs back up into bed.  She was able to be successful getting to EOB by hooking her weaker left leg with her stronger right leg, however, this strategy was unsuccessful when going back into bed.   Transfers Overall transfer level: Needs assistance Equipment used: Rolling walker (2 wheeled) Transfers: Sit to/from Stand Sit to Stand: +2 safety/equipment;Mod assist;Min assist Stand pivot transfers: Min assist       General transfer comment: Two person min to mod assist to get to standing depending on the height of the sitting surface.  Pt needs assist to stabilize the RW and to help her power up to stand.  Verbal cues for safe hand placement and to scoot forward first before standing.   Ambulation/Gait Ambulation/Gait assistance: +2 safety/equipment;Min assist Ambulation Distance (Feet): 45  Feet Assistive device: Rolling walker (2 wheeled) Gait Pattern/deviations: Step-to pattern;Decreased stance time - left;Decreased dorsiflexion - left;Decreased weight shift to left;Steppage Gait velocity: decreased   General Gait Details: Donned shoes and L DF assist  wrapping today.  Pt walked in the solarium, up a ramp and outside.  Walking on carpet and in the heat had an extra fatigueing effect on [name] today and she was not able to walk as far.  Increased hip and knee flexion needed to compensate for the added friction on the carpet.  Chair to follow for safety and to try to encouraged increased gait distance.        Modified Rankin (Stroke Patients Only) Modified Rankin (Stroke Patients Only) Pre-Morbid Rankin Score: No symptoms Modified Rankin: Moderately severe disability     Balance Overall balance assessment: Needs assistance Sitting-balance support: Feet supported;Bilateral upper extremity supported;No upper extremity supported;Single extremity supported Sitting balance-[name]: Good     Standing balance support: Bilateral upper extremity supported Standing balance-[name]: Poor                      Cognition Arousal/Alertness: Awake/alert Behavior During Therapy: WFL for tasks assessed/performed Overall Cognitive Status: Within Functional Limits for tasks assessed                      Exercises Total Joint Exercises Ankle Circles/Pumps: AROM;PROM;Both;20 reps Quad Sets: AROM;Right;10 reps Heel Slides: AROM;AAROM;Right;Left;10 reps General Exercises - Lower Extremity Short Arc Quad: AROM;Right;10 reps Hip ABduction/ADduction: AROM;AAROM;Right;Left;10 reps    General Comments        Pertinent Vitals/Pain Pain Assessment: Faces Faces Pain Scale: Hurts even more Pain Location: right side, abdomen Pain Descriptors / Indicators: Grimacing;Guarding Pain Intervention(s): Limited activity within patient's tolerance;Monitored during session;Repositioned    Home Living   Living Arrangements: Other relatives (Lived with sister.) Available Help at Discharge: Family;Available PRN/intermittently Type of Home: House Home Access: Stairs to enter   Home Layout: Two level;1/2 bath on main level;Bed/bath  upstairs   Additional Comments: Patient concerned about possible psych admit after inpatient rehab discharge    Prior Function            PT Goals (current goals can now be found in the care plan section) Acute Rehab PT Goals Patient Stated Goal: to work hard on rehab so that she can get better.  Progress towards PT goals: Progressing toward goals    Frequency  Min 5X/week    PT Plan Current plan remains appropriate       End of Session Equipment Utilized During Treatment: Gait belt Activity Tolerance: Patient limited by fatigue Patient left: in bed;with call bell/phone within reach;with nursing/sitter in room     Time: [phone] (did not charge 1 unit due to transport time to solarium) PT Time Calculation (min) (ACUTE ONLY): 90 min  Charges:  $Gait Training: 8-22 mins $Therapeutic Exercise: 8-22 mins $Therapeutic Activity: 38-52 mins                      Jetson Pickrel B. Nakhi Choi, PT, DPT [phone]   06/03/2016, 5:45 PM

## 2016-06-03 NOTE — Progress Notes (Signed)
Nutrition Follow-up  DOCUMENTATION CODES:   Severe malnutrition in context of acute illness/injury  INTERVENTION:    Continue Regular diet   Continue multivitamin with minerals daily  NUTRITION DIAGNOSIS:   Increased nutrient needs related to  (trauma) as evidenced by estimated needs  Ongoing  GOAL:   Patient will meet greater than or equal to 90% of their needs  Met  MONITOR:   PO intake, Supplement acceptance, Labs, Weight trends, Skin, I & O's  ASSESSMENT:   Pt from [place] with no past medical hx admitted after suicide attempt by ingestion of Drano, 2 bottles of acetaminophen, nyquil, and possibly 6 alprazolam pills and jumping from 5th floor balcony of hotel. 4/3 pt is s/p exp lap, hepatorraphy, SBR, cholecystectomy, preperitoneal pelvic packing, ex fix pelvis, R HPTX, Bil pulmonary contusions.  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 S/P right thoracotomy, drainage of hemothorax, and TEE 7/7; found to have right hemothorax and incisional chest wall hematoma.   Patient remains on Regular diet. PO intake 100% per flowsheet records. Trauma note reviewed >> chest wound site with no active infection.  Diet Order:  Diet regular Room service appropriate?: Yes; Fluid consistency:: Thin  Skin:  Reviewed, no issues (Incisions)  Last BM:  7/21  Height:   Ht Readings from Last 1 Encounters:  04/26/16 5' 11"  (1.803 m)    Weight:   Wt Readings from Last 1 Encounters:  06/03/16 175 lb 3.2 oz (79.5 kg)    Ideal Body Weight:  70.4 kg  BMI:  Body mass index is 24.44 kg/m.  Estimated Nutritional Needs:   Kcal:  2000-2200  Protein:  105-125 gm  Fluid:  2-2.2 L  EDUCATION NEEDS:   No education needs identified at this time  Arthur Holms, RD, LDN Pager #: [phone] After-Hours Pager #: [phone]

## 2016-06-03 NOTE — Discharge Summary (Signed)
Physician Discharge Summary  Patient ID: [name] Bell MRN: [id number] DOB/AGE: Dec 12, 1980 35 y.o.  Admit date: 02/09/2016 Discharge date: 06/03/2016  Discharge Diagnoses Patient Active Problem List   Diagnosis Date Noted  . S/P thoracotomy 05/14/2016  . Cerebral embolism with cerebral infarction 05/11/2016  . 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 Multiple pelvic fractures (HCC) 03/03/2016  . Left wrist fracture 03/03/2016  . Multiple fractures of ribs of right side 03/03/2016  . Fracture of multiple ribs of left side 03/03/2016  . Liver laceration 03/03/2016  . Small intestine injury 03/03/2016  . Acute blood loss anemia 03/03/2016  . Suicide attempt (HCC) 03/03/2016  . Bilateral pulmonary contusion 03/03/2016  . Acute respiratory failure (HCC) 03/03/2016  . Hypokalemia 03/03/2016  . Hypernatremia 03/03/2016  . Acute kidney injury (HCC) 03/03/2016  . Ingestion of caustic substance 03/03/2016  . Tylenol overdose 03/03/2016  . Hyperglycemia 03/03/2016  . Hepatic failure (HCC) 03/03/2016  . Pressure ulcer 02/26/2016  . Fall from, out of or through building, not otherwise specified, initial encounter 02/09/2016  . Hypovolemic shock (HCC) 02/09/2016    Consultants Dr. Myrene Galas for orthopedic surgery  Dr. Amada Jupiter for gastroenterology  Dr. Bradly Bienenstock for hand surgery  Dr. Coralyn Helling for critical care medicine  Dr. Annie Sable for nephrology  Dr. Glori Luis for cardiology  Dr. Kathlee Nations Trigt for cardiothoracic surgery  Dr. Leata Mouse for psychiatry  Dr. Simonne Come for interventional radiology  Dr. Rhona Leavens for neurology  Dr. Faith Rogue for PM&R   Procedures 4/3 -- Right tube thoracostomy by Dr. Jimmye Norman  4/3 -- Exploratory laparotomy, cholecystectomy, small bowel resection, mesenteric repair, application of wound VAC, and repair of liver laceration by Dr. Jimmye Norman  4/3 -- Closed reduction of pelvic ring, application of external fixator, pelvis, and stress fluoro of the right hip under anesthesia by Dr. [name]  4/4 -- Upper endoscopy by Dr. Myrtie Neither  4/4 -- Exploratory laparotomy, removal of packs, cauterization of liver laceration, packing of liver, and closure with open abdominal VAC by Dr. [name]  4/5 -- Exploratory laparotomy and chest tube insertion by Dr. Lindie Spruce  4/7 -- Exploratory laparotomy, removal of packs, and open abdominal VAC change by Dr. Janee Morn  4/10 -- Right and left sacroiliac screw fixation at S1 and S2 by Dr. [name]  4/12 -- Exploratory laparotomy, gastrostomy tube, and placement of Abra abdominal wall closure set by Dr. Lindie Spruce  4/20 -- Right tube thoracostomy by Dr. Kathlee Nations Trigt  4/24 -- Abdominal wound closure and tracheostomy by Dr. Lindie Spruce  5/20 -- CT guided placement of drainage catheter in the right pleural space by Dr. Grace Isaac  6/20 -- Right thoracotomy and drainage of empyema by Dr. Evelene Croon  7//7 -- Right thoracotomy, drainage of hemothorax, and TEE by anesthesia by Dr. Laneta Simmers   HPI: [name] jumped from her 5th floor hotel room. She was brought to the ED as a level 1 trauma secondary to hypotension. She complained of severe abdominal pain. She was conscious, denied medical history, and admitted to drinking a liquid drain remover prior to jumping. It came to light later that she had also swallowed multiple medications and tried to slit her wrist before jumping. She was intubated in the ED. Initial evaluation showed multiple pelvic fractures and a FAST showed intraabdominal fluid. She had a right chest tube placed, her pelvis was externally stabilized, and she was taken emergently to the OR.  Hospital Course: The patient underwent damage control laparotomy and external fixation of her pelvis. She received multiple units of blood products. She was transferred to the ICU where she required multiple vasopressors  and further units of blood products. Gastroenterology was consulted because of the caustic ingestion and she underwent upper endoscopy which did not show any abnormalities. She continued to worsen on hospital day number two both from a respiratory and a metabolic standpoint. Fearing a septic etiology she returned to the operating room emergently but no source was found. She had been started on Zosyn and continued this for a 12 day course but no pathogens were ever identified. The following day she was found to have whited out her right lung. A second chest tube was going to be placed when she was noted to be coagulopathic. She was given vitamin K but continued to bleed, both from her chest and from her abdomen. She returned to surgery but no obvious active source was identified. A second chest tube was placed and she returned to the intensive care unit. Some left hand and wrist swelling prompted an x-ray which showed the wrist fractures. Hand surgery was consulted and recommended initial nonoperative treatment with plans to perform surgery at a later date if her condition stabilized. The next day saw her condition continued to worsen. She began to show signs of hepatic and renal failure and was receiving maximum doses of 3 different vasopressors and multiple units of blood products every day. She was started on steroids for possible adrenal insufficiency as we didn't have a great explanation for her hypotension and she did not seem to be actively bleeding. She started to improve and by the end of hospital day #7 she was off pressors and her respiratory failure was beginning to improve. Her liver and kidney failure also started to slowly improve. She returned to the operating room where a laparotomy did not show any further issues and she received sacroiliac pins. A couple of days later the steroids were stopped without any negative consequence on her blood pressure. Her chest tubes were put to waterseal. She  returned to the operating room for placement of the Abra device to aid in abdominal wall closure. She had had and continued to have multiple electrolyte abnormalities which were managed in the usual fashion. She was able to have both chest tubes removed. Critical care medicine was consulted for ventilator management over a weekend without surgical critical care coverage. Despite some recovery in her acute kidney injury they once again worsened and by hospital day #14 nephrology was consulted. Cardiothoracic surgery was also consulted to address the continued opacification of her right hemithorax that had worsened again after chest tube removal. Both services elected to do some watchful waiting to see what would transpire over the next few days. She had a 1 minute run of ventricular tachycardia that broke spontaneously without medications. Cardiology was consulted and made some medication adjustments. She did not repeat this during the rest of her hospital stay. She continued to receive packed red blood cells as needed when her hemoglobin dropped low enough. Because her right pleural effusion continued to worsen she was taken to the operating room by cardiothoracic surgery who placed a new chest tube. On hospital day #21 she returned to the operating room for tracheostomy and removal of her Delsa Bern device with closure of her abdomen. Over the next week she struggled to wean on the ventilator. Her hemothorax seemed to worsen significantly though thoracic surgery was loathe  to do anything more than continue with her chest tube. On hospital day #30 she received 1 more unit of packed red blood cells but managed to make it to trach collar. She developed a pressure sore around her tracheostomy which was treated locally. Speech therapy was consulted for swallowing evaluation and [name] valve trials. Once she was able to talk psychiatry was consulted and recommended inpatient behavioral health placement on discharge.  Physical and occupational therapy were also consulted and worked at helping the patient with bed to chair transfers. As her hemothorax had not resolved she had a drain placed by interventional radiology. As part of the fever workup a sputum culture grew Enterobacter and she completed a ten-day course of cefepime. By hospital day #50 she had managed to stay off the ventilator and transferred to the stepdown unit. By hospital day #60 she was able to be decannulated. She finally passed a swallow study at this point and was able to be started on a diet. Her right pleural pigtail catheter was removed. Efforts were made to get the patient admitted to [facility] though they were resistant to admission. She was transferred to the floor. While we were waiting for [facility] to put her on the waiting list she developed what was thought to be an abscess at one of her right chest tube sites. Cultures were taken and the area was dressed. Because of the large volume of drainage a CT scan of the chest was performed which showed pleural involvement suggesting an empyema. Thoracic surgery was reconsulted. She was taken to the OR for thoracotomy to address this. Cultures again grew Enterobacter and she was continued on Maxipime for a full course. She was gradually progressed out of the intensive care unit to stepdown and then the floor. Attempts were made again to secure admission to [facility] and, once she was on the waiting list, admit her to comprehensive inpatient rehabilitation. It was during this process, on hospital day #90, that she developed dysphagia and left-sided weakness. CT scan of the head showed a stroke versus a delayed complication from an intracerebral hemorrhage. Neurology was consulted. An extensive workup was undertaken to find a source for embolic stroke but none was found. Therefore, the latter etiology was favored. The patient made a rapid improvements in her  dysphagia and slower improvements in her left-sided weakness as she worked with physical, occupational, and speech therapies. She received another 2 units of packed red blood cells for anemia on hospital day #96. Her final chest tube was removed on hospital day #105. Inpatient rehabilitation performed an official consultation and agreed with admission once psychiatric disposition was arranged. Even though no definitive psychiatric disposition was finalized she was accepted to and discharged to inpatient rehabilitation in good condition on hospital day #115.   Scheduled Meds: . atorvastatin  20 mg Oral q1800  . bacitracin   Topical BID  . bisacodyl  10 mg Oral Daily  . clonazePAM  0.5 mg Oral QHS  . ferrous gluconate  324 mg Oral BID WC  . lacosamide  100 mg Oral BID  . multivitamin with minerals  1 tablet Oral Daily  . polyethylene glycol  17 g Oral BID  . senna-docusate  1 tablet Oral QHS  . sertraline  100 mg Oral Daily   Continuous Infusions:  PRN Meds:.diphenhydrAMINE, hydrALAZINE, ipratropium-albuterol, levalbuterol, lidocaine-prilocaine, morphine injection, ondansetron (ZOFRAN) IV, oxyCODONE, phenol, potassium chloride, simethicone, sodium phosphate   Follow-up Information    [facility]. Schedule an appointment as soon as possible for a visit today.   Contact information: [address] [place] [address] [phone]       [facility]. Schedule an appointment as soon as possible for a visit today.   Contact information: [address]     [address] [place] [address] [phone]       Budd Palmer, MD. Schedule an appointment as soon as possible for a visit today.   Specialty:  Orthopedic Surgery Contact information: [address] [place] [address] [phone]          Discharge planning took greater than 30 minutes.    Signed: Freeman Caldron,  PA-C Pager: [phone] General Trauma PA Pager: [phone] 06/03/2016, 3:12 PM

## 2016-06-04 ENCOUNTER — Inpatient Hospital Stay (HOSPITAL_COMMUNITY): Payer: Self-pay | Admitting: Physical Therapy

## 2016-06-04 ENCOUNTER — Inpatient Hospital Stay (HOSPITAL_COMMUNITY): Payer: Medicaid Other

## 2016-06-04 ENCOUNTER — Inpatient Hospital Stay (HOSPITAL_COMMUNITY): Payer: Self-pay | Admitting: Occupational Therapy

## 2016-06-04 ENCOUNTER — Inpatient Hospital Stay (HOSPITAL_COMMUNITY): Payer: Self-pay

## 2016-06-04 DIAGNOSIS — S069X3S Unspecified intracranial injury with loss of consciousness of 1 hour to 5 hours 59 minutes, sequela: Secondary | ICD-10-CM

## 2016-06-04 LAB — CBC WITH DIFFERENTIAL/PLATELET
BASOS ABS: 0 10*3/uL (ref 0.0–0.1)
Basophils Relative: 0 %
EOS PCT: 6 %
Eosinophils Absolute: 0.4 10*3/uL (ref 0.0–0.7)
HEMATOCRIT: 30.1 % — AB (ref 36.0–46.0)
Hemoglobin: 9.5 g/dL — ABNORMAL LOW (ref 12.0–15.0)
LYMPHS ABS: 1.8 10*3/uL (ref 0.7–4.0)
LYMPHS PCT: 25 %
MCH: 26.5 pg (ref 26.0–34.0)
MCHC: 31.6 g/dL (ref 30.0–36.0)
MCV: 83.8 fL (ref 78.0–100.0)
MONO ABS: 0.4 10*3/uL (ref 0.1–1.0)
Monocytes Relative: 6 %
NEUTROS ABS: 4.4 10*3/uL (ref 1.7–7.7)
Neutrophils Relative %: 63 %
PLATELETS: 237 10*3/uL (ref 150–400)
RBC: 3.59 MIL/uL — AB (ref 3.87–5.11)
RDW: 14.6 % (ref 11.5–15.5)
WBC: 7 10*3/uL (ref 4.0–10.5)

## 2016-06-04 LAB — COMPREHENSIVE METABOLIC PANEL
ALT: 15 U/L (ref 14–54)
AST: 20 U/L (ref 15–41)
Albumin: 3.2 g/dL — ABNORMAL LOW (ref 3.5–5.0)
Alkaline Phosphatase: 65 U/L (ref 38–126)
Anion gap: 11 (ref 5–15)
BILIRUBIN TOTAL: 0.7 mg/dL (ref 0.3–1.2)
BUN: 14 mg/dL (ref 6–20)
CALCIUM: 10.2 mg/dL (ref 8.9–10.3)
CHLORIDE: 102 mmol/L (ref 101–111)
CO2: 27 mmol/L (ref 22–32)
CREATININE: 0.87 mg/dL (ref 0.44–1.00)
Glucose, Bld: 100 mg/dL — ABNORMAL HIGH (ref 65–99)
Potassium: 3.6 mmol/L (ref 3.5–5.1)
Sodium: 140 mmol/L (ref 135–145)
TOTAL PROTEIN: 6.6 g/dL (ref 6.5–8.1)

## 2016-06-04 NOTE — Progress Notes (Signed)
Physical Therapy Note  Patient Details  Name: [name] Bell MRN: [id number] Date of Birth: [birth year] Today's Date: 06/04/2016    Time: 1350-1445 55 minutes  1:1 Pt c/o Lt toe pain, repostioned and rest as needed.  Attempt to have pt stand from recliner, pt unable due to fatigue/stiffness from sitting too long. Pt required +2 assist and stedy to perform sit to stand to doff pants in standing.  Pt propelled w/c in controlled environment with min A 43' with cues for steering and turns.  Sit to stand training at hi lo mat with increasingly lower heights. Pt improved with repetition, able to sit to stand with supervision.  Stand pivot transfer training multiple attempts with min A for steadying, cues for sequencing.  Min A for sit to supine as pt requested to rest in bed at end of session. Pt requires frequent rest breaks during session but is agreeable to all PT treatment.   [name] 06/04/2016, 2:50 PM

## 2016-06-04 NOTE — H&P (View-Only) (Signed)
Physical Medicine and Rehabilitation Admission H&P       Chief Complaint  Patient presents with  . Polytrauma    HPI: [name] Bellis a 34 y.o.femalewho was admitted on 02/09/16 after suicide attempt. She drank drano, jumped out of 5th floor hotel room and left DNR note. She sustained multiple injuries including right rib fractures with HPTX treated with chest tube, Pelvic fracture requiring SI pinning 4/10, multiple abdominal procedures including cholecystectomy, bowel resection and wound VAC and closure by secondary intention. Multiple left wrist fractures splinted and WBAT per Ddr. Ortmann, tracheostomy for VDRF and PEG placement of nutritional support. She developed empyema requiring thoracotomy for drainage on 06/20 by Dr. Bartle. She tolerated extubation and decannulation without difficulty. Psychiatry following for input and medication management--for transfer to [facility] once chest tubes removed. 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 CT venogram without evidence of cortical or sagittal sinus thrombus. She developed new onset seizure on 07/05 most likely due to hemorrhagic transformation and was loaded with Vimpat. TEE 7/7 negative for vegetation or thrombus. She developed recurrent right hemothorax requiring right thoracotomy with drainage on 07/07.   Neurology following for input and repeat MRI brain 7/13 showed resolution of right frontal diffusion abnormality suggestive of late subacute to early chronic hemorrhage of unclear etiology. Dr. Sethi questioned delayed hemorrhagic contusion from fall v/s cryptogenic infarct of unknown cause and recommends ASA once bloody pleural effusion resolves and H/H stable. CT removed on 07/17 and follow up CXR showed good  resolution. Oral intake improving. She is WBAT BUE/BLE   Per Dr. Jonnalagadda "patient at chronically elevated risk for self harm; risk including previous suicide attempt, impulsivity, chronic pain, stroke, depression, history of trauma." He recommends continuing sitter and current medication regimen. Patient meets criteria for inpatient psychiatric hospitalization and involuntary commitment paper have been completed.   Patient prefers to discharge home with sister and her sister has been unhappy with current psychiatric recommendations. Both refuse  [facility]C to [facility] as they feel that this would be detrimental and prefer rehab followed by discharge to home where sister will provide supervision. She continues to be limited by LLE weakness with foot drop and requires assistance with ADL tasks.  Therapy ongoing and CIR recommended for follow up therapy.     Review of Systems  Constitutional: Negative for chills and fever.  HENT: Negative for hearing loss.   Eyes: Negative for blurred vision and double vision.  Respiratory: Negative for cough and shortness of breath.   Cardiovascular: Negative for chest pain and palpitations.  Gastrointestinal: Positive for constipation. Negative for abdominal pain and heartburn.  Genitourinary: Negative for frequency and urgency.  Musculoskeletal: Negative for back pain, joint pain and myalgias.  Skin: Negative for itching and rash.  Neurological: Negative for dizziness, tingling, focal weakness and headaches.  Psychiatric/Behavioral: Negative for depression. The patient is not nervous/anxious and does not have insomnia.           Past Medical History:  Diagnosis Date  . ADD (attention deficit disorder)    has been on medication  . Anemia   . Anxiety   . Depression   . H/O suicide attempt    at age 16.   . PTSD (post-traumatic stress disorder)          Past Surgical History:  Procedure Laterality   Date  . APPLICATION OF  WOUND VAC  02/09/2016   Procedure: APPLICATION OF WOUND VAC;  Surgeon: James [name], MD;  Location: [facility];  Service: General;;  . APPLICATION OF WOUND VAC N/A 02/16/2016   Procedure: RE-APPLICATION OF WOUND VAC;  Surgeon: Burke Thompson, MD;  Location: [facility];  Service: General;  Laterality: N/A;  . BOWEL RESECTION  02/09/2016   Procedure: SMALL BOWEL RESECTION, MESENTERIC REPAIR;  Surgeon: James [name], MD;  Location: [facility];  Service: General;;  . CHEST TUBE INSERTION Right 02/11/2016   Procedure: CHEST TUBE INSERTION;  Surgeon: James [name], MD;  [facility]: [facility];  Service: General;  Laterality: Right;  . CHEST TUBE INSERTION Right 02/26/2016   Procedure: CHEST TUBE INSERTION;  Surgeon: Peter Van Trigt, MD;  Location: [facility];  Service: Thoracic;  Laterality: Right;  . CHOLECYSTECTOMY  02/09/2016   Procedure: CHOLECYSTECTOMY;  Surgeon: James [name], MD;  [facility];  Service: General;;  . ESOPHAGOGASTRODUODENOSCOPY N/A 02/10/2016   Procedure: ESOPHAGOGASTRODUODENOSCOPY (EGD);  Surgeon: Henry L Danis III, MD;  [facility]: [facility];  Service: Endoscopy;  Laterality: N/A;  . EXTERNAL FIXATION PELVIS  02/09/2016   Procedure: EXTERNAL FIXATION PELVIS;  Surgeon: Michael [name], MD;  [facility]: [facility];  Service: Orthopedics;;  . HEMATOMA EVACUATION Right 05/14/2016   Procedure: EVACUATION HEMATOMA;  Surgeon: Bryan K Bartle, MD;  [facility];  Service: Thoracic;  Laterality: Right;  Evacuation of Hematoma Right Chest  . LACERATION REPAIR  02/09/2016   Procedure: REPAIR LIVER LACERATION;  Surgeon: James [name], MD;  [facility];  Service: General;;  . LAPAROTOMY N/A 02/10/2016   Procedure: EXPLORATORY LAPAROTOMY, removal of packs,  cauterization of liver, repacking of liver, and open abdomen vac application;  Surgeon: Burke Thompson, MD;  [facility];  Service: General;  Laterality: N/A;  . LAPAROTOMY N/A 02/11/2016   Procedure: EXPLORATORY LAPAROTOMY VAC CHANGE ;  Surgeon: James [name], MD;   [facility];  Service: General;  Laterality: N/A;  . LAPAROTOMY N/A 02/13/2016   Procedure: EXPLORATORY LAPAROTOMY, REMOVAL OF PACKS, ABDOMINAL VAC DRESSING CHANGE;  Surgeon: Burke Thompson, MD;  [facility];  Service: General;  Laterality: N/A;  . LAPAROTOMY N/A 02/18/2016   Procedure: EXPLORATORY LAPAROTOMY, PLACEMENT OF ABRA ABDOMINAL WALL CLOSURE SET;  Surgeon: James [name], MD;  [facility];  Service: General;  Laterality: N/A;  . LAPAROTOMY N/A 02/09/2016   Procedure: EXPLORATORY LAPAROTOMY;  Surgeon: James [name], MD;  [facility];  Service: General;  Laterality: N/A;  . LAPAROTOMY N/A 02/16/2016   Procedure: EXPLORATORY LAPAROTOMY, ABDOMINAL WASH OUT;  Surgeon: Burke Thompson, MD;  [facility];  Service: General;  Laterality: N/A;  . PERCUTANEOUS TRACHEOSTOMY N/A 03/01/2016   Procedure: PERCUTANEOUS TRACHEOSTOMY;  Surgeon: James [name], MD;  [facility];  Service: General;  Laterality: N/A;  . SACRO-ILIAC PINNING Right 02/16/2016   Procedure: SACRO-ILIAC PINNING;  Surgeon: Michael [name], MD;  [facility];  Service: Orthopedics;  Laterality: Right;  . TEE WITHOUT CARDIOVERSION N/A 05/14/2016   Procedure: TRANSESOPHAGEAL ECHOCARDIOGRAM (TEE);  Surgeon: Bryan K Bartle, MD;  [facility];  Service: Thoracic;  Laterality: N/A;  . THORACOTOMY/LOBECTOMY Right 04/26/2016   Procedure: Right THORACOTOMY AND DRAINAGE OF EMPYEMA;  Surgeon: Bryan K Bartle, MD;  [facility];  Service: Thoracic;  Laterality: Right;  . VIDEO ASSISTED THORACOSCOPY (VATS)/THOROCOTOMY Right 05/14/2016   Procedure: RIGHT VIDEO ASSISTED THORACOSCOPY (VATS),DRAINAGE OF EMPYEMA;  Surgeon: Bryan K Bartle, MD;  [facility];  Service: Thoracic;    Laterality: Right;  . WOUND DEBRIDEMENT N/A 03/01/2016   Procedure: ABDOMINAL WOUND CLOSURE;  Surgeon: James [name], MD;  [facility];  Service: General;  Laterality: N/A;         Family History  Problem Relation Age of Onset  . Hypertension  Father   . Mental illness Mother   . Stroke Maternal Aunt   . Hypertension Sister   . Fibroids Sister     Social History:  reports that she has never smoked. She does not have any smokeless tobacco history on file. Her alcohol and drug histories are not on file.    Allergies: No Known Allergies    No prescriptions prior to admission.    Home: Home Living Family/patient expects to be discharged to:: Unsure Living Arrangements: Spouse/significant other Additional Comments: pt non-verbal unable to report where she lives, per chart pt recently located to [place] 3 weeks ago   Functional History: Prior Function Level of Independence: Independent  Functional Status:  Mobility: Bed Mobility Overal bed mobility: Needs Assistance Bed Mobility: Supine to Sit Rolling: Min assist Sidelying to sit: Mod assist Supine to sit: HOB elevated, Supervision Sit to supine: +2 for physical assistance, Mod assist Sit to sidelying: Mod assist General bed mobility comments: Pt has been OOB in recliner chair now for 2 hours and stayed in recliner chair at the end of my session.  Transfers Overall transfer level: Needs assistance Equipment used: Rolling walker (2 wheeled) Transfer via Lift Equipment: Stedy Transfers: Sit to/from Stand Sit to Stand: Min assist Stand pivot transfers: Min assist Squat pivot transfers: Total assist, +2 physical assistance (+2 with increased assist after fatigue.  ) General transfer comment: Min assist to support pt at hips to boost up from lower chair.  Verbal cues for hand placement and to scoot out first before attempting to stand from so deep in the chair.  Significant reliance on hands for transitions. Ambulation/Gait Ambulation/Gait assistance: Min assist, +2 safety/equipment Ambulation Distance (Feet): 210 Feet (with one seated rest break) Assistive device: Rolling walker (2 wheeled) Gait Pattern/deviations: Step-through pattern, Decreased  dorsiflexion - left, Decreased weight shift to left, Steppage, Trunk flexed General Gait Details: Pt with L leg steppage gait pattern due to decreased DF strength in left foot and ankle.  After seated rest break PT applied L foot DF assist ace wrapping to left lower leg and pt with increased speed and efficiency during gait and less external rotation at the lower leg as she fatigued due to better left foot clearance.  Min assist to support trunk for safety and chair to follow to encourage increased safe gait distance.  Pt was able to correct/steer RW today.   Gait velocity: decreased  Gait velocity interpretation: Below normal speed for age/gender Wheelchair Mobility Wheelchair mobility: Yes Wheelchair propulsion: Both lower extermities Wheelchair parts: Other (comment) (did not review WC parts.  ) Distance: 300 ft with supervision and intermittent moments of min assist.  Pt required x2 rest breaks due to fatigue.  Performed WC mobility without O2 and on RA with PMV 96%.  Pt required cues for obstacle negotiation and min-supervision for management of lines and leads.  Remains with  IV, chest tube on R and trach.   Wheelchair Assistance Details (indicate cue type and reason): Required assistance at end of trial.  Pt left sitting outside window with sitter present.    ADL: ADL Overall ADL's : Needs assistance/impaired Eating/Feeding: Independent Eating/Feeding Details (indicate cue type and reason): Pt self fed finger foods   and drank from straw with R hand, used L hand to reach inside bag for pretzels. Grooming: Wash/dry hands, Wash/dry face, Oral care, Applying deodorant, Set up Grooming Details (indicate cue type and reason): pt up in recliner. Pt instructed to scoot hips forward in recliner to sit unsupported. Pt c/o of abdominal pain after approximately 20 second and began scooting hips back in chair Upper Body Bathing: Set up Upper Body Bathing Details (indicate cue type and reason): EOB with  increased encouragement  Lower Body Bathing: Minimal assistance, Sitting/lateral leans (circle sitting in bed) Lower Body Bathing Details (indicate cue type and reason): bathing at bed level with use of long handled sponge. Educated pt on use of bed positionoing/use of bed controls.to help increase her independence with bathing Upper Body Dressing : Set up, Sitting Lower Body Dressing: Bed level, Set up Lower Body Dressing Details (indicate cue type and reason): donning socks. Pt has not attempted pants/panties yet Toilet Transfer: Moderate assistance, Ambulation, BSC, RW Toilet Transfer Details (indicate cue type and reason): did not occur Toileting- Clothing Manipulation and Hygiene: Moderate assistance, Sit to/from stand Toileting - Clothing Manipulation Details (indicate cue type and reason): Pt wearing brief today. Discussed goal of using BSC and discontinuing use of brief. Pt expressed her concerns of having difficulty having a BM and urinary delay. Encouraged pt to discuss concerns with her MD. Functional mobility during ADLs: Rolling walker, Minimal assistance (took a few steps and pivoted to chair ) General ADL Comments: Focus of session involved ambulating to BSC at sink. Using RUE to complete pericare. REquried A to complete and for L buttock.   Cognition: Cognition Overall Cognitive Status: Impaired/Different from baseline Arousal/Alertness: Lethargic Orientation Level: Oriented X4 Attention: Sustained Sustained Attention: Impaired Sustained Attention Impairment: Functional basic Problem Solving: Impaired Problem Solving Impairment: Functional basic Cognition Arousal/Alertness: Awake/alert Behavior During Therapy: WFL for tasks assessed/performed Overall Cognitive Status: Impaired/Different from baseline Area of Impairment: Attention, Memory Orientation Level: Time, Disoriented to Current Attention Level: Selective Memory: Decreased short-term memory Following Commands:  Follows one step commands consistently, Follows multi-step commands with increased time Safety/Judgement: Decreased awareness of safety, Decreased awareness of deficits Awareness: Emergent Problem Solving: Slow processing General Comments: Continues to demonstrate improved cognition daily with increased insight into medical situation and discharge disposition Difficult to assess due to: Impaired communication   Blood pressure 123/78, pulse 72, temperature 98.4 F (36.9 C), temperature source Oral, resp. rate 18, height 5' 11" (1.803 m), weight 79.5 kg (175 lb 3.2 oz), SpO2 98 %. Physical Exam  Nursing note and vitals reviewed. Constitutional: She is oriented to person, place, and time. She appears well-developed and well-nourished.  HENT:  Head: Normocephalic and atraumatic.  Mouth/Throat: Oropharynx is clear and moist.  Eyes: Conjunctivae are normal. Pupils are equal, round, and reactive to light. Right eye exhibits no discharge. No scleral icterus.  Neck: Normal range of motion. Neck supple.  Cardiovascular: Regular rhythm.  Tachycardia present.   Murmur heard. Respiratory: Effort normal and breath sounds normal. No stridor. No respiratory distress. She has no wheezes.  Right lateral chest incision has healed well. Small dime size pink area at prior chest tube site.   GI: Soft. Bowel sounds are normal. She exhibits no distension. There is no tenderness.  Midline incision healing well--superficial with beefy red tissue. Healed scars noted from retention sutures.   Musculoskeletal: She exhibits edema.  Neurological: She is alert and oriented to person, place, and time.  Speech clear. Able to follow commands without difficulty. LLE   with sustained clonus.  Skin: Skin is warm and dry. No rash noted. No erythema.  Psychiatric: Her behavior is normal. Thought content normal.  Almost euphoric today.   Motor strength: 5-/5 in the right deltoid, biceps, triceps, grip, 4/5  hip flexor, knee  extensor, ankle dorsiflexor. 3+ left deltoid, biceps, triceps, grip 3/5, left hip flexor, 0 at the knee extensor, 2 minus, left hip extensor, 0 at the ankle dorsiflexor and plantar flexor. Sensation mildly reduced to light touch in the left versus right upper and lower limb No evidence of facial droop   Lab Results Last 48 Hours  No results found for this or any previous visit (from the past 48 hour(s)).   Imaging Results (Last 48 hours)  No results found.       Medical Problem List and Plan: 1. 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  DVT Prophylaxis/Anticoagulation: Pharmaceutical: Lovenox 3. Pain Management: tylenol prn 4. Mood: Patient denies any anxiety, depressive symptoms, insomnia, nightmares or intent for self harm.  Psychiatry to follow. LCSW to follow for support.  5. Neuropsych: This patient is not fully capable of making decisions on her own behalf. 6. Skin/Wound Care: Monitor wound for healing.  7. Fluids/Electrolytes/Nutrition: Monitor by mouth intake, follow-up on bmet 8. Abdominal procedures with bowel resection: Po intake has been good. Continue bid dressing change to midline incision. 9. TBI/R-MCA infarct with DAI with hemorrhage: Will add ASA--chest tube out and H/H stable.   10 New onset seizures:  Controlled on vimpat bid. 11. Empyema RLL s/p VATS: Resolved. Respiratory status stable.  12. Constipation: refusing miralax--will change to Senna S. . 13   Depression: Mood appears stable on Zoloft daily and on Klonopin at bedtime.  14. ABLA: on iron supplement.    Post Admission Physician Evaluation: Functional deficits secondary  to  right MCA infarct and traumatic brain injury 1. . 2. Patient is admitted to receive collaborative, interdisciplinary care between the physiatrist, rehab nursing staff, and therapy team. 3. Patient's level of medical complexity and substantial therapy needs in  context of that medical necessity cannot be provided at a lesser intensity of care such as a SNF. 4. Patient has experienced substantial functional loss from his/her baseline which was documented above under the "Functional History" and "Functional Status" headings.  Judging by the patient's diagnosis, physical exam, and functional history, the patient has potential for functional progress which will result in measurable gains while on inpatient rehab.  These gains will be of substantial and practical use upon discharge  in facilitating mobility and self-care at the household level. 5. Physiatrist will provide 24 hour management of medical needs as well as oversight of the therapy plan/treatment and provide guidance as appropriate regarding the interaction of the two. 6. 24 hour rehab nursing will assist with bladder management, bowel management, safety, skin/wound care, disease management, medication administration, pain management, patient education and monitor mood and safety   and help integrate therapy concepts, techniques,education, etc. 7. PT will assess and treat for/with: pre gait, gait training, endurance , safety, equipment, neuromuscular re education.   Goals are: Min assist. 8. OT will assess and treat for/with: ADLs, Cognitive perceptual skills, Neuromuscular re education, safety, endurance, equipment.   Goals are: Min assist lower, and modified independent. Upper ADLs. Therapy may not proceed with showering this patient. 9. SLP will assess and treat for/with: Cognition.  Goals are: Modified independent medication management. 10. Case Management and Social Worker will   assess and treat for psychological issues and discharge planning. 11. Team conference will be held weekly to assess progress toward goals and to determine barriers to discharge. 12. Patient will receive at least 3 hours of therapy per day at least 5 days per week. 13. ELOS: 14-17 days       14. Prognosis:   good     Andrew E. Kirsteins M.D. [facility] FAAPM&R (Sports Med, Neuromuscular Med) Diplomate Am Board of Electrodiagnostic Med  06/03/2016  

## 2016-06-04 NOTE — Evaluation (Signed)
Physical Therapy Assessment and Plan  Patient Details  Name: [name] Bell MRN: [id number] Date of Birth: [birth year]  PT Diagnosis: Difficulty walking, Hemiparesis non-dominant, Impaired cognition, Impaired sensation, Muscle weakness and Pain in ribs and abdomen on R Rehab Potential: Good ELOS: 14 days   Today's Date: 06/04/2016 PT Individual Time: 0800-0930 PT Individual Time Calculation (min): 90 min     Problem List: Patient Active Problem List   Diagnosis Date Noted  . Traumatic brain injury with loss of consciousness of 1 hour to 5 hours 59 minutes ([place]) 06/03/2016  . Left hemiparesis ([place])   . S/P thoracotomy 05/14/2016  . Cerebral embolism with cerebral infarction 05/11/2016  . Major depressive disorder, recurrent severe without psychotic features ([place]) 04/12/2016  . Multiple pelvic fractures ([place]) 03/03/2016  . Left wrist fracture 03/03/2016  . Multiple fractures of ribs of right side 03/03/2016  . Fracture of multiple ribs of left side 03/03/2016  . Liver laceration 03/03/2016  . Small intestine injury 03/03/2016  . Acute blood loss anemia 03/03/2016  . Suicide attempt ([place]) 03/03/2016  . Bilateral pulmonary contusion 03/03/2016  . Acute respiratory failure ([place]) 03/03/2016  . Hypokalemia 03/03/2016  . Hypernatremia 03/03/2016  . Acute kidney injury ([place]) 03/03/2016  . Ingestion of caustic substance 03/03/2016  . Tylenol overdose 03/03/2016  . Hyperglycemia 03/03/2016  . Hepatic failure ([place]) 03/03/2016  . Pressure ulcer 02/26/2016  . Fall from, out of or through building, not otherwise specified, initial encounter 02/09/2016  . Hypovolemic shock ([place]) 02/09/2016    Past Medical History:  Past Medical History:  Diagnosis Date  . ADD (attention deficit disorder)    has been on medication  . Anemia   . Anxiety   . Depression   . H/O suicide attempt    at age 19.   Marland Kitchen PTSD (post-traumatic stress disorder)    Past Surgical History:  Past Surgical History:   Procedure Laterality Date  . APPLICATION OF WOUND VAC  02/09/2016   Procedure: APPLICATION OF WOUND VAC;  Surgeon: Judeth Horn, MD;  Location: [place];  Service: General;;  . APPLICATION OF WOUND VAC N/A 02/16/2016   Procedure: RE-APPLICATION OF WOUND VAC;  Surgeon: Georganna Skeans, MD;  Location: [place];  Service: General;  Laterality: N/A;  . BOWEL RESECTION  02/09/2016   Procedure: SMALL BOWEL RESECTION, MESENTERIC REPAIR;  Surgeon: Judeth Horn, MD;  Location: [place];  Service: General;;  . CHEST TUBE INSERTION Right 02/11/2016   Procedure: CHEST TUBE INSERTION;  Surgeon: Judeth Horn, MD;  Location: [place];  Service: General;  Laterality: Right;  . CHEST TUBE INSERTION Right 02/26/2016   Procedure: CHEST TUBE INSERTION;  Surgeon: Ivin Poot, MD;  Location: [place];  Service: Thoracic;  Laterality: Right;  . CHOLECYSTECTOMY  02/09/2016   Procedure: CHOLECYSTECTOMY;  Surgeon: Judeth Horn, MD;  Location: [place];  Service: General;;  . ESOPHAGOGASTRODUODENOSCOPY N/A 02/10/2016   Procedure: ESOPHAGOGASTRODUODENOSCOPY (EGD);  Surgeon: Doran Stabler, MD;  Location: [facility] ENDOSCOPY;  Service: Endoscopy;  Laterality: N/A;  . EXTERNAL FIXATION PELVIS  02/09/2016   Procedure: EXTERNAL FIXATION PELVIS;  Surgeon: [place], MD;  Location: [place];  Service: Orthopedics;;  . HEMATOMA EVACUATION Right 05/14/2016   Procedure: EVACUATION HEMATOMA;  Surgeon: Gaye Pollack, MD;  Location: [place];  Service: Thoracic;  Laterality: Right;  Evacuation of Hematoma Right Chest  . LACERATION REPAIR  02/09/2016   Procedure: REPAIR LIVER LACERATION;  Surgeon: Judeth Horn, MD;  Location: [place];  Service:  General;;  . LAPAROTOMY N/A 02/10/2016   Procedure: EXPLORATORY LAPAROTOMY, removal of packs,  cauterization of liver, repacking of liver, and open abdomen vac application;  Surgeon: Georganna Skeans, MD;  Location: [place];  Service: General;  Laterality: N/A;  . LAPAROTOMY N/A 02/11/2016   Procedure: EXPLORATORY LAPAROTOMY VAC CHANGE ;   Surgeon: Judeth Horn, MD;  Location: [place];  Service: General;  Laterality: N/A;  . LAPAROTOMY N/A 02/13/2016   Procedure: EXPLORATORY LAPAROTOMY, REMOVAL OF PACKS, ABDOMINAL VAC DRESSING CHANGE;  Surgeon: Georganna Skeans, MD;  Location: [place];  Service: General;  Laterality: N/A;  . LAPAROTOMY N/A 02/18/2016   Procedure: EXPLORATORY LAPAROTOMY, PLACEMENT OF ABRA ABDOMINAL WALL CLOSURE SET;  Surgeon: Judeth Horn, MD;  Location: [place];  Service: General;  Laterality: N/A;  . LAPAROTOMY N/A 02/09/2016   Procedure: EXPLORATORY LAPAROTOMY;  Surgeon: Judeth Horn, MD;  Location: [place];  Service: General;  Laterality: N/A;  . LAPAROTOMY N/A 02/16/2016   Procedure: EXPLORATORY LAPAROTOMY, ABDOMINAL [place];  Surgeon: Georganna Skeans, MD;  Location: [place];  Service: General;  Laterality: N/A;  . PERCUTANEOUS TRACHEOSTOMY N/A 03/01/2016   Procedure: PERCUTANEOUS TRACHEOSTOMY;  Surgeon: Judeth Horn, MD;  Location: [place];  Service: General;  Laterality: N/A;  . SACRO-ILIAC PINNING Right 02/16/2016   Procedure: [place];  Surgeon: [place]e [place], MD;  Location: [place];  Service: Orthopedics;  Laterality: Right;  . TEE WITHOUT CARDIOVERSION N/A 05/14/2016   Procedure: TRANSESOPHAGEAL ECHOCARDIOGRAM (TEE);  Surgeon: Gaye Pollack, MD;  [facility];  Service: Thoracic;  Laterality: N/A;  . THORACOTOMY/LOBECTOMY Right 04/26/2016   Procedure: Right THORACOTOMY AND DRAINAGE OF EMPYEMA;  Surgeon: Gaye Pollack, MD;  Location: [place];  Service: Thoracic;  Laterality: Right;  Marland Kitchen VIDEO ASSISTED THORACOSCOPY (VATS)/THOROCOTOMY Right 05/14/2016   Procedure: RIGHT VIDEO ASSISTED THORACOSCOPY (VATS),DRAINAGE OF EMPYEMA;  Surgeon: Gaye Pollack, MD;  Location: [place];  Service: Thoracic;  Laterality: Right;  . WOUND DEBRIDEMENT N/A 03/01/2016   Procedure: ABDOMINAL WOUND CLOSURE;  Surgeon: Judeth Horn, MD;  Location: [place];  Service: General;  Laterality: N/A;    Assessment & Plan Clinical Impression: Patient is a 34 y.o.  year old female with recent admission to the hospital on 02/09/16 after suicide attempt. She drank drano, jumped out of 5th floor hotel room and left DNR note. She sustained multiple injuries including right rib fractures with HPTX treated with chest tube, Pelvic fracture requiring SI pinning 4/10, multiple abdominal procedures including cholecystectomy, bowel resection and wound VAC and closure by secondary intention. Multiple left wrist fractures splinted and WBAT per Ddr. Caralyn Guile, tracheostomy for VDRF and PEG placement of nutritional support. She developed empyema requiring thoracotomy for drainage on 06/20 by Dr. Cyndia Bent. She tolerated extubation and decannulation without difficulty. Psychiatry following for input and medication management--for transfer to [facility] once chest tubes removed. 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 CT venogram without evidence of cortical or sagittal sinus thrombus. She developed new onset seizure on 07/05 most likely due to hemorrhagic transformation and was loaded with Vimpat. TEE 7/7 negative for vegetation or thrombus. She developed recurrent right hemothorax requiring right thoracotomy with drainage on 07/07.   Neurology following for input and repeat MRI brain 7/13 showed resolution of right frontal diffusion abnormality suggestive of late subacute to early chronic hemorrhage of unclear  etiology. Dr. Leonie Man questioned delayed hemorrhagic contusion from fall v/s cryptogenic infarct of unknown cause and recommends ASA once bloody pleural effusion resolves and H/H stable. CT removed on 07/17 and follow up CXR showed good resolution. Oral intake improving. She is WBAT BUE/BLE  Per Dr. Louretta Shorten "patient at chronically elevated risk for self harm; risk  including previous suicide attempt, impulsivity, chronic pain, stroke, depression, history of trauma." He recommends continuing sitter and current medication regimen. Patient meets criteria for inpatient psychiatric hospitalization and involuntary commitment paper have been completed.   Patient prefers to discharge home with sister and her sister has beenunhappy with current psychiatric recommendations. Both refuse IVC to central hospital as they feel that this would be detrimental and prefer rehabfollowed by discharge to home where sister will provide supervision. She continues to be limited by LLE weakness with foot drop and requires assistance with ADL tasks. Therapy ongoing and CIR recommended for follow up therapy.  Patient transferred to CIR on 06/03/2016 .   Patient currently requires mod with mobility secondary to muscle weakness, muscle joint tightness and muscle paralysis, decreased cardiorespiratoy endurance, decreased coordination, decreased attention, decreased safety awareness and decreased memory and decreased sitting balance, decreased standing balance, decreased postural control, hemiplegia and decreased balance strategies.  Prior to hospitalization, patient was independent  with mobility.  Home access is 1/2 step entryStairs to enter (sister's home information as pt would d/c here if not to psych hospital.)  Patient will benefit from skilled PT intervention to maximize safe functional mobility, minimize fall risk and decrease caregiver burden for planned discharge home with 24 hour supervision.  Anticipate patient will benefit from follow-up PT at discharge.  PT - End of Session Activity Tolerance: Decreased this session Endurance Deficit: Yes Endurance Deficit Description: rest breaks throughout PT Assessment Rehab Potential (ACUTE/IP ONLY): Good Barriers to Discharge:  (home vs. psych admission) PT Patient demonstrates impairments in the following area(s):  Balance;Behavior;Endurance;Motor;Safety;Sensory;Skin Integrity PT Transfers Functional Problem(s): Bed Mobility;Bed to Chair;Car;Furniture PT Locomotion Functional Problem(s): Ambulation;Stairs PT Plan PT Intensity: Minimum of 1-2 x/day ,45 to 90 minutes PT Frequency: 5 out of 7 days PT Duration Estimated Length of Stay: 14 days PT Treatment/Interventions: Ambulation/gait training;Balance/vestibular training;Cognitive remediation/compensation;Discharge planning;Disease management/prevention;DME/adaptive equipment instruction;Functional mobility training;Neuromuscular re-education;Pain management;Patient/family education;Psychosocial support;Skin care/wound management;Splinting/orthotics;Stair training;Therapeutic Activities;Therapeutic Exercise;UE/LE Strength taining/ROM;UE/LE Coordination activities;Wheelchair propulsion/positioning PT Transfers Anticipated Outcome(s): supervision overall PT Locomotion Anticipated Outcome(s): supervision gait; min assist stairs PT Recommendation Follow Up Recommendations: 24 hour supervision/assistance Patient destination:  (Home vs. Psych Admission) Equipment Recommended: Rolling walker with 5" wheels Equipment Details: May need AFO prior to d/c  Skilled Therapeutic Intervention  Individual treatment initiated with orientation to rehab POC and goals and focus on neuro re-ed for transfer techniques and balance, gait with RW (without shoes and with shoes to assess with dorsiflexion wrap), stair negotiation initiated, transfers including simulated car, bed mobility, and overall education with pt and pt's sister. Pt required min to mod assist throughout session and required rest breaks due to fatigue. Sitter present throughout session.  PT Evaluation Precautions/Restrictions Precautions Precautions: Fall;Other (comment) (suicide precautions) Precaution Comments: left sided weakness Other Brace/Splint: L PRAFO in bed Restrictions Weight Bearing Restrictions:  No RUE Weight Bearing: Weight bearing as tolerated LUE Weight Bearing: Weight bearing as tolerated RLE Weight Bearing: Weight bearing as tolerated LLE Weight Bearing: Weight bearing as tolerated Pain  Reports soreness in R side of ribs/abdoment but not  of pain. Home Living/Prior Functioning Home Living Available Help at Discharge: Family;Available PRN/intermittently Type of Home:  House Home Access: Stairs to enter [employer] of Steps: 1/2 step entry Home Layout: Two level;1/2 bath on main level;Bed/bath upstairs Alternate Level Stairs-Number of Steps: Flight Additional Comments: This information is per chart about her sister's home. Possible admission to psych upon d/c from CIR.  Lives With: Family Prior Function Level of Independence: Independent with transfers;Independent with gait;Independent with basic ADLs  Able to Take Stairs?: Yes Cognition Overall Cognitive Status: Difficult to assess Comments: Difficult to assess and determine what is cognition, psych, behavioral. Will need further assessment Sensation Sensation Light Touch: Impaired Detail Light Touch Impaired Details: Impaired LUE;Impaired LLE Proprioception: Impaired Detail Proprioception Impaired Details: Impaired LLE Coordination Gross Motor Movements are Fluid and Coordinated: No Coordination and Movement Description: decreased coordination in LUE and LLE Motor  Motor Motor: Hemiplegia;Abnormal postural alignment and control     Trunk/Postural Assessment  Cervical Assessment Cervical Assessment: Within Functional Limits Thoracic Assessment Thoracic Assessment: Within Functional Limits (flexed posture) Lumbar Assessment Lumbar Assessment: Within Functional Limits (posterior pelvic tilt) Postural Control Postural Control: Deficits on evaluation (decreased balance strategies)  Balance Balance Balance Assessed: Yes Static Sitting Balance Static Sitting - Level of Assistance: 5: Stand by  assistance Dynamic Sitting Balance Dynamic Sitting - Level of Assistance: 4: Min assist Static Standing Balance Static Standing - Level of Assistance: 4: Min assist Dynamic Standing Balance Dynamic Standing - Level of Assistance: 3: Mod assist Extremity Assessment   see OT summary for details   RLE Assessment RLE Assessment: Within Functional Limits LLE Assessment LLE Assessment: Exceptions to [facility] LLE Strength LLE Overall Strength Comments: grossly 2+ to 3-/5; decreased foot clearance and stability noted during gait/transfers; decreased coordination   See Function Navigator for Current Functional Status.   Refer to Care Plan for Long Term Goals  Recommendations for other services: Other: Psych  Discharge Criteria: Patient will be discharged from PT if patient refuses treatment 3 consecutive times without medical reason, if treatment goals not met, if there is a change in medical status, if patient makes no progress towards goals or if patient is discharged from hospital.  The above assessment, treatment plan, treatment alternatives and goals were discussed and mutually agreed upon: by patient and by family  Juanna Cao, PT, DPT  06/04/2016, 11:42 AM

## 2016-06-04 NOTE — Progress Notes (Signed)
[facility] PHYSICAL MEDICINE & REHABILITATION     PROGRESS NOTE    Subjective/Complaints: No apparent issues overnight. Slept well. Denies pain. PT at bedside about to transfer her this morning.   ROS: Pt denies fever, rash/itching, headache, blurred or double vision, nausea, vomiting, abdominal pain, diarrhea, chest pain, shortness of breath, palpitations, dysuria, dizziness,  , bleeding, acute anxiety, or depression   Objective: Vital Signs: Blood pressure 120/73, pulse 71, temperature 98 F (36.7 C), resp. rate 16, height 5\' 11"  (1.803 m), weight 80.3 kg (177 lb), SpO2 100 %. No results found.  Recent Labs  06/04/16 0453  WBC 7.0  HGB 9.5*  HCT 30.1*  PLT 237    Recent Labs  06/04/16 0453  NA 140  K 3.6  CL 102  GLUCOSE 100*  BUN 14  CREATININE 0.87  CALCIUM 10.2   CBG (last 3)  No results for input(s): GLUCAP in the last 72 hours.  Wt Readings from Last 3 Encounters:  06/04/16 80.3 kg (177 lb)  06/03/16 79.5 kg (175 lb 3.2 oz)  05/20/16 81.6 kg (180 lb)    Physical Exam:  Constitutional: She is oriented to person, place, and time. She appears well-developedand well-nourished.  HENT:  Head: Normocephalicand atraumatic.  Mouth/Throat: Oropharynx is clear and moist.  Eyes: Conjunctivaeare normal. Pupils are equal, round, and reactive to light. Right eye exhibits no discharge. No scleral icterus.  Neck: Normal range of motion. Neck supple.  Cardiovascular: Regular rhythm. Tachycardiapresent.  Murmurheard. Respiratory: Effort normaland breath sounds normal. No stridor. No respiratory distress. She has no wheezes.  Right lateral chest incision has healed well. Small  pink area at prior chest tube site.  GI: Soft. Bowel sounds are normal. She exhibits no distension. There is no tenderness.  Midline incision healing well--superficial with beefy red tissue. Healed scars noted from retention sutures.  Musculoskeletal: She exhibits edema.  Neurological:  She is alertand oriented to person, place, and time.  Speech clear. Able to follow commands without difficulty. LLE with sustained clonus.  Motor strength: 5-/5 in the right deltoid, biceps, triceps, grip, 4/5  hip flexor, knee extensor, ankle dorsiflexor. 3+ left deltoid, biceps, triceps, grip 3+/5, left hip flexor, 0 at the knee extensor, 2  left hip extensor, 0 at the ankle dorsiflexor and plantar flexor. Sensation mildly reduced to light touch in the left versus right upper and lower limb No evidence of facial droop Skin: Skin is warmand dry. No rashnoted. No erythema.  Psychiatric: Her behavior is normal. Thought contentnormal.  In good spirits today. cooperative     Assessment/Plan: 1. Left hemiparesis and functional/cognitive deficits secondary to right MCA infarct/TBI/polytrauma which require 3+ hours per day of interdisciplinary therapy in a comprehensive inpatient rehab setting. Physiatrist is providing close team supervision and 24 hour management of active medical problems listed below. Physiatrist and rehab team continue to assess barriers to discharge/monitor patient progress toward functional and medical goals.  Function:  Bathing Bathing position      Bathing parts      Bathing assist        Upper Body Dressing/Undressing Upper body dressing                    Upper body assist        Lower Body Dressing/Undressing Lower body dressing  Lower body assist        Toileting Toileting          Toileting assist     Transfers Chair/bed Optician, dispensing          Cognition Comprehension    Expression    Social Interaction    Problem Solving    Memory     Medical Problem List and Plan: 1. Left  hemiparesis and cognitive deficits secondary to right MCA infarct and traumatic brain injury  -beginning therapies today 2. DVT  Prophylaxis/Anticoagulation:   Lovenox indicated 3. Pain Management: tylenol prn 4. Mood: Patient denies any anxiety, depressive symptoms, insomnia, nightmares or intent for self harm.   -psychiatry recommending inpatient commitment to psych hospital. Pt/sister do not want this   -will follow and address as she progresses through admission  -this patient does not appear to be a flight risk at this point 5. Neuropsych: This patient is not fullycapable of making decisions on herown behalf. 6. Skin/Wound Care: Monitor wound for healing.  7. Fluids/Electrolytes/Nutrition: Monitor by mouth intake, follow-up on bmet 8. Abdominal procedures with bowel resection: Po intake has been good.   -Continue bid dressing change to midline incision. 9. TBI/R-MCA infarct with DAI with hemorrhage: ASA added 10 New onset seizures: Controlled on vimpat bid. 11. Empyema RLL s/p VATS: Resolved. Respiratory status stable.  12. Constipation:  Senna S. . 13 Depression: Mood appears stable on Zoloft daily and on Klonopin at bedtime.  14. ABLA: on iron supplement.    LOS (Days) 1 A FACE TO FACE EVALUATION WAS PERFORMED  SWARTZ,ZACHARY T 06/04/2016 9:11 AM

## 2016-06-04 NOTE — Progress Notes (Signed)
**  Preliminary report by tech** Bilateral lower extremity venous duplex completed. All visualized veins appear patent and compressible with no evidence of deep or superficial vein thrombosis bilaterally. No evidence of Baker's cysts bilaterally. No changes when compared to previous exams performed on 02/28/2016, 03/18/2016, 04/03/2016, and 05/12/2016.  06/04/16 4:34 PM Olen Cordial RVT

## 2016-06-04 NOTE — Progress Notes (Signed)
Patient information reviewed and entered into eRehab system by Tacy Chavis, RN, CRRN, PPS Coordinator.  Information including medical coding and functional independence measure will be reviewed and updated through discharge.    

## 2016-06-04 NOTE — Interval H&P Note (Signed)
[name] Bell was admitted 06/03/16 to Inpatient Rehabilitation with the diagnosis of CVA/TBI.  The patient's history has been reviewed, patient examined, and there is no change in status.  Patient continues to be appropriate for intensive inpatient rehabilitation.  I have reviewed the patient's chart and labs.  Questions were answered to the patient's satisfaction. The PAPE has been reviewed and assessment remains appropriate.  Lockie Bothun T 06/04/2016, 11:02 AM
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
## 2016-06-04 NOTE — Evaluation (Signed)
Occupational Therapy Assessment and Plan  Patient Details  Name: [name] Bell MRN: [id number] Date of Birth: Sep 08, 1981  OT Diagnosis: acute pain, cognitive deficits and muscle weakness (generalized) Rehab Potential: Rehab Potential (ACUTE ONLY): Good ELOS: 2-3 weeks   Today's Date: 06/04/2016 OT Individual Time: 7614-7092 OT Individual Time Calculation (min): 85 min      Problem List: Patient Active Problem List   Diagnosis Date Noted  . Traumatic brain injury with loss of consciousness of 1 hour to 5 hours 59 minutes ([place]) 06/03/2016  . Left hemiparesis ([place])   . S/P thoracotomy 05/14/2016  . Cerebral embolism with cerebral infarction 05/11/2016  . Major depressive disorder, recurrent severe without psychotic features ([place]) 04/12/2016  . Multiple pelvic fractures ([place]) 03/03/2016  . Left wrist fracture 03/03/2016  . Multiple fractures of ribs of right side 03/03/2016  . Fracture of multiple ribs of left side 03/03/2016  . Liver laceration 03/03/2016  . Small intestine injury 03/03/2016  . Acute blood loss anemia 03/03/2016  . Suicide attempt ([place]) 03/03/2016  . Bilateral pulmonary contusion 03/03/2016  . Acute respiratory failure ([place]) 03/03/2016  . Hypokalemia 03/03/2016  . Hypernatremia 03/03/2016  . Acute kidney injury ([place]) 03/03/2016  . Ingestion of caustic substance 03/03/2016  . Tylenol overdose 03/03/2016  . Hyperglycemia 03/03/2016  . Hepatic failure ([place]) 03/03/2016  . Pressure ulcer 02/26/2016  . Fall from, out of or through building, not otherwise specified, initial encounter 02/09/2016  . Hypovolemic shock ([place]) 02/09/2016    Past Medical History:  Past Medical History:  Diagnosis Date  . ADD (attention deficit disorder)    has been on medication  . Anemia   . Anxiety   . Depression   . H/O suicide attempt    at age 12.   Marland Kitchen PTSD (post-traumatic stress disorder)    Past Surgical History:  Past Surgical History:  Procedure Laterality Date  .  APPLICATION OF WOUND VAC  02/09/2016   Procedure: APPLICATION OF WOUND VAC;  Surgeon: Judeth Horn, MD;  Location: [place];  Service: General;;  . APPLICATION OF WOUND VAC N/A 02/16/2016   Procedure: RE-APPLICATION OF WOUND VAC;  Surgeon: Georganna Skeans, MD;  Location: [place];  Service: General;  Laterality: N/A;  . BOWEL RESECTION  02/09/2016   Procedure: SMALL BOWEL RESECTION, MESENTERIC REPAIR;  Surgeon: Judeth Horn, MD;  Location: [place];  Service: General;;  . CHEST TUBE INSERTION Right 02/11/2016   Procedure: CHEST TUBE INSERTION;  Surgeon: Judeth Horn, MD;  Location: [place];  Service: General;  Laterality: Right;  . CHEST TUBE INSERTION Right 02/26/2016   Procedure: CHEST TUBE INSERTION;  Surgeon: Ivin Poot, MD;  Location: [place];  Service: Thoracic;  Laterality: Right;  . CHOLECYSTECTOMY  02/09/2016   Procedure: CHOLECYSTECTOMY;  Surgeon: Judeth Horn, MD;  Location: [place];  Service: General;;  . ESOPHAGOGASTRODUODENOSCOPY N/A 02/10/2016   Procedure: ESOPHAGOGASTRODUODENOSCOPY (EGD);  Surgeon: Doran Stabler, MD;  Location: [facility] ENDOSCOPY;  Service: Endoscopy;  Laterality: N/A;  . EXTERNAL FIXATION PELVIS  02/09/2016   Procedure: EXTERNAL FIXATION PELVIS;  Surgeon: Altamese Alafaya, MD;  Location: [place];  Service: Orthopedics;;  . HEMATOMA EVACUATION Right 05/14/2016   Procedure: EVACUATION HEMATOMA;  Surgeon: Gaye Pollack, MD;  Location: [place];  Service: Thoracic;  Laterality: Right;  Evacuation of Hematoma Right Chest  . LACERATION REPAIR  02/09/2016   Procedure: REPAIR LIVER LACERATION;  Surgeon: Judeth Horn, MD;  Location: [place];  Service: General;;  . LAPAROTOMY N/A  02/10/2016   Procedure: EXPLORATORY LAPAROTOMY, removal of packs,  cauterization of liver, repacking of liver, and open abdomen vac application;  Surgeon: [name], MD;  Location[facility] OR;  Service: General;  Laterality: N/A;  . LAPAROTOMY N/A 02/11/2016   Procedure: EXPLORATORY LAPAROTOMY VAC CHANGE ;  Surgeon: Jimmye Norman, MD;   Location: [facility] OR;  Service: General;  Laterality: N/A;  . LAPAROTOMY N/A 02/13/2016   Procedure: EXPLORATORY LAPAROTOMY, REMOVAL OF PACKS, ABDOMINAL VAC DRESSING CHANGE;  Surgeon: [name], MD;  Location: [facility] OR;  Service: General;  Laterality: N/A;  . LAPAROTOMY N/A 02/18/2016   Procedure: EXPLORATORY LAPAROTOMY, PLACEMENT OF ABRA ABDOMINAL WALL CLOSURE SET;  Surgeon: Jimmye Norman, MD;  Location: [facility] OR;  Service: General;  Laterality: N/A;  . LAPAROTOMY N/A 02/09/2016   Procedure: EXPLORATORY LAPAROTOMY;  Surgeon: Jimmye Norman, MD;  Location: [facility] OR;  Service: General;  Laterality: N/A;  . LAPAROTOMY N/A 02/16/2016   Procedure: EXPLORATORY LAPAROTOMY, ABDOMINAL WASH OUT;  Surgeon: [name], MD;  Location: [facility] OR;  Service: General;  Laterality: N/A;  . PERCUTANEOUS TRACHEOSTOMY N/A 03/01/2016   Procedure: PERCUTANEOUS TRACHEOSTOMY;  Surgeon: Jimmye Norman, MD;  Location: [facility] OR;  Service: General;  Laterality: N/A;  . SACRO-ILIAC PINNING Right 02/16/2016   Procedure: Loyal Gambler;  Surgeon: Myrene Galas, MD;  Location[facility] OR;  Service: Orthopedics;  Laterality: Right;  . TEE WITHOUT CARDIOVERSION N/A 05/14/2016   Procedure: TRANSESOPHAGEAL ECHOCARDIOGRAM (TEE);  Surgeon: Alleen Borne, MD;  Location: [facility] OR;  Service: Thoracic;  Laterality: N/A;  . THORACOTOMY/LOBECTOMY Right 04/26/2016   Procedure: Right THORACOTOMY AND DRAINAGE OF EMPYEMA;  Surgeon: Alleen Borne, MD;  Location: [facility];  Service: Thoracic;  Laterality: Right;  Marland Kitchen VIDEO ASSISTED THORACOSCOPY (VATS)/THOROCOTOMY Right 05/14/2016   Procedure: RIGHT VIDEO ASSISTED THORACOSCOPY (VATS),DRAINAGE OF EMPYEMA;  Surgeon: Alleen Borne, MD;  Location: [facility] OR;  Service: Thoracic;  Laterality: Right;  . WOUND DEBRIDEMENT N/A 03/01/2016   Procedure: ABDOMINAL WOUND CLOSURE;  Surgeon: Jimmye Norman, MD;  Location: [facility] OR;  Service: General;  Laterality: N/A;    Assessment & Plan Clinical Impression: Patient is a 35 y.o. year old female who was  admitted on 02/09/16 after suicide attempt. She drank drano, jumped out of 5th floor hotel room and left DNR note. She sustained multiple injuries including right rib fractures with HPTX treated with chest tube, Pelvic fracture requiring SI pinning 4/10, multiple abdominal procedures including cholecystectomy, bowel resection and wound VAC and closure by secondary intention. Multiple left wrist fractures splinted and WBAT per Ddr. Melvyn Novas, tracheostomy for VDRF and PEG placement of nutritional support. She developed empyema requiring thoracotomy for drainage on 06/20 by Dr. Laneta Simmers. She tolerated extubation and decannulation without difficulty. Psychiatry following for input and medication management--for transfer to [facility] once chest tubes removed. 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 CT venogram without evidence of cortical or sagittal sinus thrombus. She developed new onset seizure on 07/05 most likely due to hemorrhagic transformation and was loaded with Vimpat. TEE 7/7 negative for vegetation or thrombus. She developed recurrent right hemothorax requiring right thoracotomy with drainage on 07/07.   Neurology following for input and repeat MRI brain 7/13 showed resolution of right frontal diffusion abnormality suggestive of late subacute to early chronic hemorrhage of unclear etiology. Dr. Pearlean Brownie questioned delayed hemorrhagic contusion from  fall v/s cryptogenic infarct of unknown cause and recommends ASA once bloody pleural effusion resolves and H/H stable. CT removed on 07/17 and follow up CXR showed good resolution. Oral intake improving. She is WBAT BUE/BLE   Per Dr. Louretta Shorten "patient at chronically elevated risk for self harm; risk including previous suicide attempt,  impulsivity, chronic pain, stroke, depression, history of trauma." He recommends continuing sitter and current medication regimen. Patient meets criteria for inpatient psychiatric hospitalization and involuntary commitment paper have been completed.   Patient prefers to discharge home with sister and her sister has beenunhappy with current psychiatric recommendations. Both refuse IVC to central hospital as they feel that this would be detrimental and prefer rehabfollowed by discharge to home where sister will provide supervision. She continues to be limited by LLE weakness with foot drop and requires assistance with ADL tasks. Patient transferred to CIR on 06/03/2016 .    Patient currently requires max with basic self-care skills and mod A for basic mobility secondary to muscle weakness and acute pain, decreased cardiorespiratoy endurance, abnormal tone, decreased coordination and decreased motor planning, decreased attention, decreased awareness, decreased problem solving and decreased safety awareness and decreased standing balance, decreased postural control, hemiplegia and decreased balance strategies.  Prior to hospitalization, patient could complete ADL with independent .  Patient will benefit from skilled intervention to decrease level of assist with basic self-care skills and increase independence with basic self-care skills prior to discharge to central psych facility.  Anticipate patient will require 24 hour supervision and follow up outpatient.  OT - End of Session Activity Tolerance: Tolerates 10 - 20 min activity with multiple rests Endurance Deficit: Yes Endurance Deficit Description: rest breaks throughout OT Assessment Rehab Potential (ACUTE ONLY): Good OT Patient demonstrates impairments in the following area(s): Balance;Pain;Perception;Behavior;Cognition;Edema;Safety;Sensory;Skin Integrity;Endurance;Motor;Vision OT Basic ADL's Functional Problem(s):  Grooming;Bathing;Dressing;Toileting OT Transfers Functional Problem(s): Toilet;Tub/Shower OT Additional Impairment(s): Fuctional Use of Upper Extremity OT Plan OT Intensity: Minimum of 1-2 x/day, 45 to 90 minutes OT Frequency: 5 out of 7 days OT Duration/Estimated Length of Stay: 2-3 weeks OT Treatment/Interventions: Balance/vestibular training;Disease mangement/prevention;Community reintegration;Neuromuscular re-education;Patient/family education;Self Care/advanced ADL retraining;Splinting/orthotics;Therapeutic Exercise;UE/LE Coordination activities;Wheelchair propulsion/positioning;Visual/perceptual remediation/compensation;UE/LE Strength taining/ROM;Therapeutic Activities;Skin care/wound managment;Psychosocial support;Pain management;Functional mobility training;DME/adaptive equipment instruction;Discharge planning;Cognitive remediation/compensation OT Self Feeding Anticipated Outcome(s): n/a OT Basic Self-Care Anticipated Outcome(s): supervision OT Toileting Anticipated Outcome(s): supervision OT Bathroom Transfers Anticipated Outcome(s): supervision OT Recommendation Recommendations for Other Services: Neuropsych consult Patient destination: Home Follow Up Recommendations: Outpatient OT;24 hour supervision/assistance Equipment Recommended: To be determined   Skilled Therapeutic Intervention OT eval initiated with Ot purpose, role and goals discussed. Self care retraining at shower level. When arrived pt on toilet trying to have a BM. RN came to assist pt; performing digital stimulation. Decr awareness of purpose of stool softeners and result in not taking them as they are offered - provided education with nurse. Pt transferred off toilet with mod A into standing with RW and walked over to shower. Pt demonstrated decr functional mobility problem solving and decr motor planning with left LE. Pt bathed at very slow rate due to decr endurance requiring frequent rest breaks.  Pt lost a lot of  hair in the shower- made RN aware. Pt reported finishing bathing and as she was drying off reported she had not washed her arms yet and they were dirty, decr again decr emergent awareness and problem solving. Pt transfer stand pivot into w/c with mod A. Pt with decr activity tolerance and endurance to dress self so required overall max  A. Transfer into recliner to rest. Left pt with RN and sitter at end of session.   OT Evaluation Precautions/Restrictions  Precautions Precautions: Fall;Other (comment) Precaution Comments: left sided weakness; sucide precautions Restrictions RUE Weight Bearing: Weight bearing as tolerated LUE Weight Bearing: Weight bearing as tolerated RLE Weight Bearing: Weight bearing as tolerated LLE Weight Bearing: Weight bearing as tolerated General Chart Reviewed: Yes Family/Caregiver Present: No Freight forwarder) Vital Signs Therapy Vitals Temp: 98.9 F (37.2 C) Pulse Rate: 94 Resp: 18 BP: 109/70 Oxygen Therapy SpO2: 98 % O2 Device: Not Delivered Pain Pain Assessment Pain Assessment: 0-10 Pain Score: 6  Pain Type: Acute pain Pain Location: Buttocks Pain Orientation: Mid Patients Stated Pain Goal: 3 Pain Intervention(s): Medication (See eMAR);Repositioned Home Living/Prior Functioning Home Living Available Help at Discharge: Family, Available PRN/intermittently Type of Home: House Home Access: Stairs to enter [employer] of Steps: 1/2 step entry Home Layout: Two level, 1/2 bath on main level, Bed/bath upstairs Alternate Level Stairs-Number of Steps: Flight Additional Comments: This information is per chart about her sister's home. Possible admission to psych upon d/c from CIR.  Lives With: Family Prior Function Level of Independence: Independent with transfers, Independent with gait, Independent with basic ADLs  Able to Take Stairs?: Yes ADL ADL ADL Comments: see functional navigator Vision/Perception  Vision- History Baseline  Vision/History: Wears glasses Wears Glasses: At all times Patient Visual Report: No change from baseline Vision- Assessment Vision Assessment?: No apparent visual deficits Praxis Praxis-Other Comments: decr motor planning with transfers -related to left LE  Cognition Overall Cognitive Status: Impaired/Different from baseline Arousal/Alertness: Awake/alert Orientation Level: Person;Place;Situation Person: Oriented Place: Oriented Situation: Oriented Year: 2017 Month: July Day of Week: Correct Memory: Appears intact Immediate Memory Recall: Sock;Bed;Blue Memory Recall: Sock;Blue;Bed Memory Recall Sock: Without Cue Memory Recall Blue: Without Cue Memory Recall Bed: Without Cue Attention: Sustained;Selective Sustained Attention: Appears intact Sustained Attention Impairment: Functional basic Selective Attention: Impaired Selective Attention Impairment: Functional basic Awareness: Impaired Awareness Impairment: Emergent impairment;Anticipatory impairment Problem Solving: Impaired Problem Solving Impairment: Functional basic Executive Function: Reasoning Reasoning: Impaired Reasoning Impairment: Functional complex Safety/Judgment: Impaired Comments: Difficult to assess and determine what is cognition, psych, behavioral. Will need further assessment [employer] Scales of Cognitive Functioning: Purposeful/appropriate Sensation Sensation Light Touch: Impaired Detail Light Touch Impaired Details: Impaired LUE;Impaired LLE Proprioception: Impaired Detail Proprioception Impaired Details: Impaired LLE Coordination Gross Motor Movements are Fluid and Coordinated: No Coordination and Movement Description: decreased coordination in LUE and LLE Motor  Motor Motor: Hemiplegia;Abnormal postural alignment and control Mobility  Transfers Transfers: Sit to Stand;Stand to Sit Sit to Stand: 3: Mod assist Stand to Sit: 4: Min assist  Trunk/Postural Assessment  Cervical  Assessment Cervical Assessment: Within Functional Limits Thoracic Assessment Thoracic Assessment: Within Functional Limits Lumbar Assessment Lumbar Assessment: Within Functional Limits Postural Control Postural Control: Within Functional Limits (decr balance strageties)  Balance Static Sitting Balance Static Sitting - Level of Assistance: 5: Stand by assistance Dynamic Sitting Balance Dynamic Sitting - Level of Assistance: 4: Min assist Static Standing Balance Static Standing - Level of Assistance: 4: Min assist Dynamic Standing Balance Dynamic Standing - Level of Assistance: 3: Mod assist Extremity/Trunk Assessment RUE Assessment RUE Assessment: Within Functional Limits LUE Assessment LUE Assessment:  (able to do opposition; 3+/4)   See Function Navigator for Current Functional Status.   Refer to Care Plan for Long Term Goals  Recommendations for other services: Neuropsych  Discharge Criteria: Patient will be discharged from OT if patient refuses treatment 3 consecutive times without  medical reason, if treatment goals not met, if there is a change in medical status, if patient makes no progress towards goals or if patient is discharged from hospital.  The above assessment, treatment plan, treatment alternatives and goals were discussed and mutually agreed upon: by patient  Nicoletta Ba 06/04/2016, 1:54 PM

## 2016-06-04 NOTE — Progress Notes (Signed)
Meredith Staggers, MD Physician Addendum Physical Medicine and Rehabilitation  Consult Note Date of Service: 05/27/2016 8:27 AM  Related encounter: ED to Hosp-Admission (Discharged) from 02/09/2016 in [place] Collapse All        Physical Medicine and Rehabilitation Consult  Reason for Consult:   Suicide attempt with subsequent  TBI, pelvic fractures, recurrent hemopneumothorax/empyema, hemorrhagic stroke with left hemiparesis.  Referring Physician:  Dr. Grandville Silos    HPI: [name] Bell is a 35 y.o. female who was admitted on 02/09/16 after suicide attempt. She drank drano, jumped out of 5th floor hotel room and left DNR note.  She sustained multiple injuries including right rib fractures with HPTX treated with chest tube,  Pelvic fracture requiring SI pinning 4/10, multiple abdominal procedures including cholecystectomy, bowel resection and wound VAC and closure by secondary intention. Multiple left wrist fractures splinted and WBAT per Ddr. Caralyn Guile,  tracheostomy for VDRF and PEG placement of nutritional support. She developed empyema requiring thoracotomy for drainage on 06/20 by Dr. Cyndia Bent.  She tolerated extubation and decannulation without difficulty. Psychiatry following for input and medication management--for transfer to [facility] once chest tubes removed.  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 CT venogram without evidence of cortical or sagittal sinus thrombus.  She developed new onset seizure on 07/05 most likely due to hemorrhagic transformation and was loaded with Vimpat.   TEE 7/7 negative for vegetation or thrombus. She developed recurrent right hemothorax requiring right thoracotomy with drainage on 07/07.  Neurology  following for input and repeat MRI brain 7/13 showed resolution of right frontal diffusion abnormality suggestive of late subacute to early chronic hemorrhage of unclear etiology. Dr. Leonie Man questioned delayed hemorrhagic contusion from fall v/s cryptogenic infarct of unknown cause and recommends ASA once bloody pleural effusion resolves and H/H stable. CT removed on 07/17 and respiratory status stable. She is WBAT BUE/BLE. Therapy ongoing and encouraging patient on importance of getting out of bed daily.  Patient work ing on working on sitting balance at [employer] as well as pre-gait activity and weight bearing on BLE for 1-2 minutes.    Review of Systems  HENT: Negative for hearing loss.   Eyes: Negative for blurred vision and double vision.  Respiratory: Negative for cough and shortness of breath.   Cardiovascular: Negative for chest pain and palpitations.  Gastrointestinal: Negative for heartburn and nausea.  Genitourinary: Negative for dysuria and urgency.  Musculoskeletal: Positive for myalgias and back pain.  Neurological: Negative for dizziness, tingling, sensory change and headaches.           Past Medical History  Diagnosis Date  . Depression   . H/O suicide attempt     at age 62.   Marland Kitchen PTSD (post-traumatic stress disorder)   . Anxiety   . ADD (attention deficit disorder)     has been on medication  . Anemia             Past Surgical History  Procedure Laterality Date  . Esophagogastroduodenoscopy N/A 02/10/2016    Procedure: ESOPHAGOGASTRODUODENOSCOPY (EGD);  Surgeon: Doran Stabler, MD;  Location: [facility];  Service: Endoscopy;  Laterality: N/A;  . Laparotomy N/A 02/10/2016    Procedure: EXPLORATORY LAPAROTOMY, removal of packs,  cauterization of liver, repacking of liver,  and open abdomen vac application;  Surgeon: Georganna Skeans, MD;  Location: [place];  Service: General;  Laterality: N/A;  . Laparotomy N/A 02/11/2016    Procedure: EXPLORATORY LAPAROTOMY  VAC CHANGE ;  Surgeon: Judeth Horn, MD;  Location: [place];  Service: General;  Laterality: N/A;  . Chest tube insertion Right 02/11/2016    Procedure: CHEST TUBE INSERTION;  Surgeon: Judeth Horn, MD;  Location: [place];  Service: General;  Laterality: Right;  . Laparotomy N/A 02/13/2016    Procedure: EXPLORATORY LAPAROTOMY, REMOVAL OF PACKS, ABDOMINAL VAC DRESSING CHANGE;  Surgeon: Georganna Skeans, MD;  Location: [place];  Service: General;  Laterality: N/A;  . Laparotomy N/A 02/18/2016    Procedure: EXPLORATORY LAPAROTOMY, PLACEMENT OF ABRA ABDOMINAL WALL CLOSURE SET;  Surgeon: Judeth Horn, MD;  Location: [place];  Service: General;  Laterality: N/A;  . Chest tube insertion Right 02/26/2016    Procedure: CHEST TUBE INSERTION;  Surgeon: Ivin Poot, MD;  Location: [place];  Service: Thoracic;  Laterality: Right;  . Wound debridement N/A 03/01/2016    Procedure: ABDOMINAL WOUND CLOSURE;  Surgeon: Judeth Horn, MD;  Location: [place];  Service: General;  Laterality: N/A;  . Percutaneous tracheostomy N/A 03/01/2016    Procedure: PERCUTANEOUS TRACHEOSTOMY;  Surgeon: Judeth Horn, MD;  Location: [place];  Service: General;  Laterality: N/A;  . Laparotomy N/A 02/09/2016    Procedure: EXPLORATORY LAPAROTOMY;  Surgeon: Judeth Horn, MD;  Location: [place];  Service: General;  Laterality: N/A;  . Cholecystectomy  02/09/2016    Procedure: CHOLECYSTECTOMY;  Surgeon: Judeth Horn, MD;  Location: [place];  Service: General;;  . Bowel resection  02/09/2016    Procedure: SMALL BOWEL RESECTION, MESENTERIC REPAIR;  Surgeon: Judeth Horn, MD;  Location: [place];  Service: General;;  . Application of wound vac  02/09/2016    Procedure: APPLICATION OF WOUND VAC;  Surgeon: Judeth Horn, MD;  Location: [place];  Service: General;;  . Laceration repair  02/09/2016    Procedure: REPAIR LIVER LACERATION;  Surgeon: Judeth Horn, MD;  Location: [place];  Service: General;;  . External fixation pelvis  02/09/2016    Procedure: EXTERNAL  FIXATION PELVIS;  Surgeon: Altamese Graham, MD;  Location: [place];  Service: Orthopedics;;  . Laparotomy N/A 02/16/2016    Procedure: EXPLORATORY LAPAROTOMY, ABDOMINAL [place];  Surgeon: Georganna Skeans, MD;  Location: [place];  Service: General;  Laterality: N/A;  . Application of wound vac N/A 02/16/2016    Procedure: RE-APPLICATION OF WOUND VAC;  Surgeon: Georganna Skeans, MD;  Location: [place];  Service: General;  Laterality: N/A;  . Sacro-iliac pinning Right 02/16/2016    Procedure: [place];  Surgeon: Altamese Omer, MD;  Location: [place];  Service: Orthopedics;  Laterality: Right;  . Thoracotomy/lobectomy Right 04/26/2016    Procedure: Right THORACOTOMY AND DRAINAGE OF EMPYEMA;  Surgeon: Gaye Pollack, MD;  Location: [place];  Service: Thoracic;  Laterality: Right;  . Video assisted thoracoscopy (vats)/thorocotomy Right 05/14/2016    Procedure: RIGHT VIDEO ASSISTED THORACOSCOPY (VATS),DRAINAGE OF EMPYEMA;  Surgeon: Gaye Pollack, MD;  Location: [place];  Service: Thoracic;  Laterality: Right;  . Tee without cardioversion N/A 05/14/2016    Procedure: TRANSESOPHAGEAL ECHOCARDIOGRAM (TEE);  Surgeon: Gaye Pollack, MD;  Location: [facility] OR;  Service: Thoracic;  Laterality: N/A;  . Hematoma evacuation Right 05/14/2016    Procedure: EVACUATION HEMATOMA;  Surgeon: Gaye Pollack, MD;  Location: [facility] OR;  Service: Thoracic;  Laterality: Right;  Evacuation of Hematoma  Right Chest         Family History  Problem Relation Age of Onset  . Hypertension Father   . Mental illness Mother   . Stroke Maternal Aunt   . Hypertension Sister   . Fibroids Sister      Social History:  Single. Was working for [employer] in [place]. She moved to [place] in Feb 2017 to be closer to her sister. Was living with sister and her family--sister is self employed.  She reports that she quit smoking in 2011.  Has smoked off and  on for about for year. She does not have any smokeless tobacco history on  file. Her alcohol and drug histories are not on file.    Allergies: No Known Allergies    No prescriptions prior to admission    Home: Home Living Family/patient expects to be discharged to:: Unsure Living Arrangements: Spouse/significant other Additional Comments: pt non-verbal unable to report where she lives, per chart pt recently located to [place] 3 weeks ago  Functional History: Prior Function Level of Independence: Independent Functional Status:  Mobility: Bed Mobility Overal bed mobility: Needs Assistance Bed Mobility: Supine to Sit, Sit to Sidelying, Rolling Rolling: Min assist Sidelying to sit: Mod assist, +2 for physical assistance, HOB elevated Supine to sit: Mod assist, HOB elevated Sit to supine: +2 for physical assistance, Max assist, Total assist Sit to sidelying: Mod assist General bed mobility comments: Rolling bed mobilities performed as nurse tech performed peri care mid treatment. Pt required mod assist to raise torso off bed with HOB elevated for supine to sit. She was able perform a sitting pivot to EOB with min assist to move LLE. During sit to side lying she fully controlled her torso while therapist assisted with raising her LEs onto bed Transfers Overall transfer level: Needs assistance Equipment used: Rolling walker (2 wheeled) Transfer via Lift Equipment: Stedy Transfers: Sit to/from Stand (x2) Sit to Stand: Min assist, +2 physical assistance, +2 safety/equipment Stand pivot transfers:  (used stedy for stand pivot.  ) Squat pivot transfers: Total assist, +2 physical assistance (+2 with increased assist after fatigue.  ) General transfer comment: Pt declined tranfer today due to comlaints of pain Ambulation/Gait Ambulation/Gait assistance: +2 physical assistance, Min assist Ambulation Distance (Feet): 80 Feet (with break) Assistive device: Rolling walker (2 wheeled) Gait Pattern/deviations: Step-through pattern, Shuffle ((RLE externally rotated;  VC requried to stand up straight)) General Gait Details: Pt. walked down the hallway and took a seated break before continuing.  Gait velocity interpretation: Below normal speed for age/gender Wheelchair Mobility Wheelchair mobility: Yes Wheelchair propulsion: Both lower extermities Wheelchair parts: Other (comment) (did not review WC parts.  ) Distance: 300 ft with supervision and intermittent moments of min assist.  Pt required x2 rest breaks due to fatigue.  Performed WC mobility without O2 and on RA with PMV 96%.  Pt required cues for obstacle negotiation and min-supervision for management of lines and leads.  Remains with  IV, chest tube on R and trach.   Wheelchair Assistance Details (indicate cue type and reason): Required assistance at end of trial.  Pt left sitting outside window with sitter present.    ADL: ADL Overall ADL's : Needs assistance/impaired Eating/Feeding: Set up, Bed level Eating/Feeding Details (indicate cue type and reason): Pt self fed finger foods and drank from straw with R hand, used L hand to reach inside bag for pretzels. Grooming: Set up, Cueing for UE precautions Grooming Details (indicate cue type and reason): pt  up in recliner. Pt instructed to scoot hips forward in recliner to sit unsupported. Pt c/o of abdominal pain after approximately 20 second and began scooting hips back in chair Upper Body Bathing: Set up, Bed level Upper Body Bathing Details (indicate cue type and reason): EOB with increased encouragement  Lower Body Bathing: Moderate assistance, Bed level Lower Body Bathing Details (indicate cue type and reason): bathing at bed level with use of long handled sponge. Educated pt on use of bed positionoing/use of bed controls.to help increase her independence with bathing Upper Body Dressing : Total assistance, Bed level Lower Body Dressing: Set up, Bed level Lower Body Dressing Details (indicate cue type and reason): Pt donned socks in bed with set  up Toilet Transfer: Moderate assistance, +2 for physical assistance, +2 for safety/equipment, Stand-pivot, Regular Toilet, Grab bars Toilet Transfer Details (indicate cue type and reason): did not occur Toileting- Clothing Manipulation and Hygiene: Total assistance, +2 for physical assistance, Bed level Toileting - Clothing Manipulation Details (indicate cue type and reason): aware she needed to be cleaned Functional mobility during ADLs: Minimal assistance, Rolling walker General ADL Comments: Discussed goals and checked off goals she has met. Pt verbalizing how she is making improvements.   Cognition: Cognition Overall Cognitive Status: Impaired/Different from baseline Arousal/Alertness: Lethargic Orientation Level: Oriented X4 Attention: Sustained Sustained Attention: Impaired Sustained Attention Impairment: Functional basic Problem Solving: Impaired Problem Solving Impairment: Functional basic Cognition Arousal/Alertness: Awake/alert Behavior During Therapy: WFL for tasks assessed/performed Overall Cognitive Status: Impaired/Different from baseline Area of Impairment: Attention Orientation Level: Time, Disoriented to Current Attention Level: Selective Memory: Decreased short-term memory Following Commands: Follows multi-step commands with increased time Safety/Judgement: Decreased awareness of safety, Decreased awareness of deficits Awareness: Emergent Problem Solving: Slow processing General Comments: improving ability to process information at quicker speeds Difficult to assess due to: Impaired communication  Blood pressure 130/76, pulse 70, temperature 98.5 F (36.9 C), temperature source Oral, resp. rate 18, height 5' 11"  (1.803 m), weight 81.9 kg (180 lb 8.9 oz), SpO2 100 %. Physical Exam  Nursing note and vitals reviewed. Constitutional: She is oriented to person, place, and time. She appears well-developed and well-nourished. No distress.  HENT:  Head:  Normocephalic and atraumatic.  Mouth/Throat: Oropharynx is clear and moist.  Eyes: Conjunctivae are normal. Pupils are equal, round, and reactive to light.  Neck: Normal range of motion. Neck supple.  Cardiovascular: Normal rate and regular rhythm.   Murmur heard. Respiratory: Effort normal and breath sounds normal. No stridor. No respiratory distress. She has no wheezes.  GI: Soft. Bowel sounds are normal. She exhibits no distension. There is tenderness.  Abdominal incision with dry dressing.   Musculoskeletal: She exhibits no edema or tenderness.  Neurological: She is alert and oriented to person, place, and time.  Speech clear. Able to follow basic commands without difficulty.  Left sided weakness--LE with emerging tone and tightness at knee 0/5. LUE 3+/5. Mild sensory deficits?    Skin: Skin is warm and dry. No rash noted. She is not diaphoretic. No erythema.  Psychiatric: Her speech is normal and behavior is normal. Thought content normal. Her mood appears not anxious. Cognition and memory are normal. She does not exhibit a depressed mood.  Pleasant and conversant. She made eye contact and interacted appropriately. Answered questions without difficulty.     Lab Results Last 24 Hours       Results for orders placed or performed during the hospital encounter of 02/09/16 (from the past 24 hour(s))  Basic  metabolic panel     Status: Abnormal   Collection Time: 05/26/16  7:51 PM  Result Value Ref Range   Sodium 137 135 - 145 mmol/L   Potassium 4.4 3.5 - 5.1 mmol/L   Chloride 103 101 - 111 mmol/L   CO2 25 22 - 32 mmol/L   Glucose, Bld 100 (H) 65 - 99 mg/dL   BUN 18 6 - 20 mg/dL   Creatinine, Ser 0.90 0.44 - 1.00 mg/dL   Calcium 10.0 8.9 - 10.3 mg/dL   GFR calc non Af Amer >60 >60 mL/min   GFR calc Af Amer >60 >60 mL/min   Anion gap 9 5 - 15      Imaging Results (Last 48 hours)  Dg Chest Port 1 View  05/27/2016  CLINICAL DATA:  History of chest trauma with rib  fractures. EXAM: PORTABLE CHEST 1 VIEW COMPARISON:  05/25/2016. FINDINGS: Mediastinum hilar structures stable. Heart size stable. Chest tube tract noted on the right. No pneumothorax. No focal infiltrate. Persistent right pleural thickening. Persistent displaced rib fractures. Surgical staples noted over the right chest. IMPRESSION: 1.  Stable chest status post chest tube removal.  No pneumothorax. 2. Persistent right pleural thickening without interim change. Displaced right rib fractures again noted. Electronically Signed   By: Marcello Moores  Register   On: 05/27/2016 07:13     Assessment/Plan: Diagnosis: Polytrauma/traumatic brain injury/right MCA infarct after suicide attempt. 1. Does the need for close, 24 hr/day medical supervision in concert with the patient's rehab needs make it unreasonable for this patient to be served in a less intensive setting? Yes 2. Co-Morbidities requiring supervision/potential complications: pain mgt 3. Due to bladder management, bowel management, safety, skin/wound care, disease management, medication administration, pain management and patient education, does the patient require 24 hr/day rehab nursing? Yes 4. Does the patient require coordinated care of a physician, rehab nurse, PT (1-2 hrs/day, 5 days/week), OT (1-2 hrs/day, 5 days/week) and SLP (1-2 hrs/day, 5 days/week) to address physical and functional deficits in the context of the above medical diagnosis(es)? Yes Addressing deficits in the following areas: balance, endurance, locomotion, strength, transferring, bowel/bladder control, bathing, dressing, feeding, grooming, toileting, cognition and psychosocial support 5. Can the patient actively participate in an intensive therapy program of at least 3 hrs of therapy per day at least 5 days per week? Yes 6. The potential for patient to make measurable gains while on inpatient rehab is good 7. Anticipated functional outcomes upon discharge from inpatient rehab are  modified independent and supervision  with PT, modified independent and supervision with OT, modified independent with SLP. 8. Estimated rehab length of stay to reach the above functional goals is: potentially 18-24 days 9. Does the patient have adequate social supports and living environment to accommodate these discharge functional goals? Potentially 10. Anticipated D/C setting: Home 11. Anticipated post D/C treatments: HH therapy and Outpatient therapy 12. Overall Rehab/Functional Prognosis: good  RECOMMENDATIONS: This patient's condition is appropriate for continued rehabilitative care in the following setting: CIR Patient has agreed to participate in recommended program. Yes Note that insurance prior authorization may be required for reimbursement for recommended care.  Comment: Need to clarify the disposition (?Inpatient Psychiatric Admission) and where she needs to be functionally to move to that venue after inpatient rehab. Rehab Admissions Coordinator to follow up.  Thanks,  Meredith Staggers, MD, Mellody Drown     05/27/2016     Revision History

## 2016-06-05 ENCOUNTER — Inpatient Hospital Stay (HOSPITAL_COMMUNITY): Payer: Medicaid Other | Admitting: Speech Pathology

## 2016-06-05 ENCOUNTER — Inpatient Hospital Stay (HOSPITAL_COMMUNITY): Payer: Self-pay | Admitting: Physical Therapy

## 2016-06-05 ENCOUNTER — Inpatient Hospital Stay (HOSPITAL_COMMUNITY): Payer: Medicaid Other | Admitting: Occupational Therapy

## 2016-06-05 NOTE — Progress Notes (Signed)
[place] PHYSICAL MEDICINE & REHABILITATION     PROGRESS NOTE    Subjective/Complaints: Complains of room being cold (it is cold). Otherwise therapy went "well" yesterday. No other complaints   ROS: Pt denies fever, rash/itching, headache, blurred or double vision, nausea, vomiting, abdominal pain, diarrhea, chest pain, shortness of breath, palpitations, dysuria, dizziness,  , bleeding, acute anxiety, or depression   Objective: Vital Signs: Blood pressure (!) 147/80, pulse 72, temperature 98.5 F (36.9 C), temperature source Oral, resp. rate 16, height  (1.803 m), weight 79.3 kg (174 lb 13.2 oz), SpO2 100 %. No results found.  Recent Labs  06/04/16 0453  WBC 7.0  HGB 9.5*  HCT 30.1*  PLT 237    Recent Labs  06/04/16 0453  NA 140  K 3.6  CL 102  GLUCOSE 100*  BUN 14  CREATININE 0.87  CALCIUM 10.2   CBG (last 3)  No results for input(s): GLUCAP in the last 72 hours.  Wt Readings from Last 3 Encounters:  06/05/16 79.3 kg (174 lb 13.2 oz)  06/03/16 79.5 kg (175 lb 3.2 oz)  05/20/16 81.6 kg (180 lb)    Physical Exam:  Constitutional: She is oriented to person, place, and time. She appears well-developedand well-nourished.  HENT:  Head: Normocephalicand atraumatic.  Mouth/Throat: Oropharynx is clear and moist.  Eyes: Conjunctivaeare normal. Pupils are equal, round, and reactive to light. Right eye exhibits no discharge. No scleral icterus.  Neck: Normal range of motion. Neck supple.  Cardiovascular: Regular rhythm. Tachycardiapresent.  Murmurheard. Respiratory: Effort normaland breath sounds normal. No stridor. No respiratory distress. She has no wheezes.  Right lateral chest incision has healed well. Small  pink area at prior chest tube site.  GI: Soft. Bowel sounds are normal. She exhibits no distension. There is no tenderness.  Midline incision healing well--superficial with beefy red tissue. Healed scars noted from retention sutures.   Musculoskeletal: She exhibits edema.  Neurological: She is alertand oriented to person, place, and time.  Speech clear. Able to follow commands without difficulty. LLE with sustained clonus.  Motor strength: 5-/5 in the right deltoid, biceps, triceps, grip, 4/5  hip flexor, knee extensor, ankle dorsiflexor. 3+ left deltoid, biceps, triceps, grip 3+/5, left hip flexor, 0 at the knee extensor, 2  left hip extensor, 0 at the ankle dorsiflexor and plantar flexor. Sensation mildly reduced to light touch in the left versus right upper and lower limb No evidence of facial droop Skin: Skin is warmand dry. No rashnoted. No erythema.  Psychiatric: Her behavior is normal. Thought contentnormal.  In good spirits today. cooperative     Assessment/Plan: 1. Left hemiparesis and functional/cognitive deficits secondary to right MCA infarct/TBI/polytrauma which require 3+ hours per day of interdisciplinary therapy in a comprehensive inpatient rehab setting. Physiatrist is providing close team supervision and 24 hour management of active medical problems listed below. Physiatrist and rehab team continue to assess barriers to discharge/monitor patient progress toward functional and medical goals.  Function:  Bathing Bathing position   Position: Shower  Bathing parts Body parts bathed by patient: Right arm, Left arm, Chest, Abdomen, Front perineal area, Right upper leg, Left upper leg Body parts bathed by helper: Buttocks, Right lower leg, Left lower leg, Back  Bathing assist Assist Level: Touching or steadying assistance(Pt > 75%)      Upper Body Dressing/Undressing Upper body dressing   What is the patient wearing?: Pull over shirt/dress     Pull over shirt/dress - Perfomed by patient: Put head  through opening Pull over shirt/dress - Perfomed by helper: Thread/unthread right sleeve, Thread/unthread left sleeve, Pull shirt over trunk        Upper body assist Assist Level: Touching or  steadying assistance(Pt > 75%)      Lower Body Dressing/Undressing Lower body dressing   What is the patient wearing?: Non-skid slipper socks, Underwear, Pants (brief) Underwear - Performed by patient: Pull underwear up/down Underwear - Performed by helper: Thread/unthread left underwear leg, Thread/unthread right underwear leg Pants- Performed by patient: Pull pants up/down Pants- Performed by helper: Thread/unthread right pants leg, Thread/unthread left pants leg   Non-skid slipper socks- Performed by helper: Don/doff right sock, Don/doff left sock                  Lower body assist Assist for lower body dressing: Touching or steadying assistance (Pt > 75%)      Toileting Toileting     Toileting steps completed by helper: Adjust clothing prior to toileting, Performs perineal hygiene, Adjust clothing after toileting    Toileting assist Assist level: Touching or steadying assistance (Pt.75%)   Transfers Chair/bed transfer   Chair/bed transfer method: Stand pivot Chair/bed transfer assist level: Moderate assist (Pt 50 - 74%/lift or lower) Chair/bed transfer assistive device: Armrests     Locomotion Ambulation     Max distance: 64'     Wheelchair       Assist Level: Dependent (Pt equals 0%)  Cognition Comprehension Comprehension assist level: Understands complex 90% of the time/cues 10% of the time  Expression Expression assist level: Expresses complex 90% of the time/cues < 10% of the time  Social Interaction Social Interaction assist level: Interacts appropriately 75 - 89% of the time - Needs redirection for appropriate language or to initiate interaction.  Problem Solving Problem solving assist level: Solves basic 90% of the time/requires cueing < 10% of the time  Memory Memory assist level: Recognizes or recalls 90% of the time/requires cueing < 10% of the time   Medical Problem List and Plan: 1. 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 DVT Prophylaxis/Anticoagulation:   Lovenox indicated  -dopplers negative 3. Pain Management: tylenol prn 4. Mood: Patient denies any anxiety, depressive symptoms, insomnia, nightmares or intent for self harm.   -psychiatry recommending inpatient commitment to psych hospital. Pt/sister do not want this   -will follow and address as she progresses through admission  -this patient does not appear to be a flight risk at this time 5. Neuropsych: This patient is not fullycapable of making decisions on herown behalf. 6. Skin/Wound Care: Monitor wound for healing.  7. Fluids/Electrolytes/Nutrition: Monitor by mouth intake, follow-up on bmet 8. Abdominal procedures with bowel resection: Po intake has been good.   -Continue bid dressing change to midline incision. 9. TBI/R-MCA infarct with DAI with hemorrhage: ASA added 10 New onset seizures: Controlled on vimpat bid. 11. Empyema RLL s/p VATS: Resolved. Respiratory status stable.  12. Constipation:  Senna S. . 13 Depression: Mood appears stable on Zoloft daily and on Klonopin at bedtime.  14. ABLA: on iron supplement.    LOS (Days) 2 A FACE TO FACE EVALUATION WAS PERFORMED  [name] Bell T 06/05/2016 8:11 AM

## 2016-06-05 NOTE — Progress Notes (Signed)
Occupational Therapy Session Note  Patient Details  Name: [name] Bell MRN: [id number] Date of Birth: 02-Dec-1980  Today's Date: 06/05/2016 OT Individual Time: 6440-3474 OT Individual Time Calculation (min): 72 min     Short Term Goals: Week 1:  OT Short Term Goal 1 (Week 1): Pt will transfer to toilet/ BSC with minA with LRAD OT Short Term Goal 2 (Week 1): Pt will don LB clothing sit to stand with min A with AE prn  OT Short Term Goal 3 (Week 1): Pt will perform 2 grooming tasks in standing at sink wiht min A OT Short Term Goal 4 (Week 1): Pt will demonstrate emergent awareness in ADL with min questioning cues  Skilled Therapeutic Interventions/Progress Updates:     Upon entering the room, pt supine in bed with sitter in room. Pt with no c/o pain but reports "soreness" in L LE from yesterday's sessions. Pt performed supine >sit with min A to EOB. Pt engaged in circle sitting in the bed to don B socks in bed.  Pt needing mod A to stand from bed and stand pivot to wheelchair. Pt propelled wheelchair to sink for grooming tasks with set up A. Pt donning clothing items with mod A for sit <>stand from sink in order to pull pants over B hips. Pt donned pull over shirt with set up A this session. Pt transferred back to bed in same manner and sat EOB with sitter present in room and food tray placed in front of pt. All needs within reach and all equipment and self care items removed from room.   Therapy Documentation Precautions:  Precautions Precautions: Fall, Other (comment) Precaution Comments: left sided weakness; sucide precautions Other Brace/Splint: L PRAFO in bed Restrictions Weight Bearing Restrictions: No RUE Weight Bearing: Weight bearing as tolerated LUE Weight Bearing: Weight bearing as tolerated RLE Weight Bearing: Weight bearing as tolerated LLE Weight Bearing: Weight bearing as tolerated General:   Vital Signs: Therapy Vitals Temp: 98.5 F (36.9 C) Temp Source:  Oral Pulse Rate: 72 Resp: 16 BP: (!) 147/80 Patient Position (if appropriate): Lying Oxygen Therapy SpO2: 100 % O2 Device: Not Delivered Pain:   ADL: ADL ADL Comments: see functional navigator  See Function Navigator for Current Functional Status.   Therapy/Group: Individual Therapy  Lowella Grip 06/05/2016, 8:17 AM

## 2016-06-05 NOTE — Evaluation (Signed)
Speech Language Pathology Assessment and Plan  Patient Details  Name: [name] Bell MRN: [id number] Date of Birth: [birth year]  SLP Diagnosis: Other (comment) (None)  Rehab Potential:   ELOS:      Today's Date: 06/05/2016 SLP Individual Time: 0905-1000 SLP Individual Time Calculation (min): 55 min    Problem List: Patient Active Problem List   Diagnosis Date Noted  . Traumatic brain injury with loss of consciousness of 1 hour to 5 hours 59 minutes ([place]) 06/03/2016  . Left hemiparesis ([place])   . S/P thoracotomy 05/14/2016  . Cerebral embolism with cerebral infarction 05/11/2016  . Major depressive disorder, recurrent severe without psychotic features ([place]) 04/12/2016  . Multiple pelvic fractures ([place]) 03/03/2016  . Left wrist fracture 03/03/2016  . Multiple fractures of ribs of right side 03/03/2016  . Fracture of multiple ribs of left side 03/03/2016  . Liver laceration 03/03/2016  . Small intestine injury 03/03/2016  . Acute blood loss anemia 03/03/2016  . Suicide attempt ([place]) 03/03/2016  . Bilateral pulmonary contusion 03/03/2016  . Acute respiratory failure ([place]) 03/03/2016  . Hypokalemia 03/03/2016  . Hypernatremia 03/03/2016  . Acute kidney injury ([place]) 03/03/2016  . Ingestion of caustic substance 03/03/2016  . Tylenol overdose 03/03/2016  . Hyperglycemia 03/03/2016  . Hepatic failure ([place]) 03/03/2016  . Pressure ulcer 02/26/2016  . Fall from, out of or through building, not otherwise specified, initial encounter 02/09/2016  . Hypovolemic shock ([place]) 02/09/2016   Past Medical History:  Past Medical History:  Diagnosis Date  . ADD (attention deficit disorder)    has been on medication  . Anemia   . Anxiety   . Depression   . H/O suicide attempt    at age 36.   Marland Kitchen PTSD (post-traumatic stress disorder)    Past Surgical History:  Past Surgical History:  Procedure Laterality Date  . APPLICATION OF WOUND VAC  02/09/2016   Procedure: APPLICATION OF WOUND VAC;   Surgeon: Judeth Horn, MD;  Location: [place];  Service: General;;  . APPLICATION OF WOUND VAC N/A 02/16/2016   Procedure: RE-APPLICATION OF WOUND VAC;  Surgeon: Georganna Skeans, MD;  Location: [facility];  Service: General;  Laterality: N/A;  . BOWEL RESECTION  02/09/2016   Procedure: SMALL BOWEL RESECTION, MESENTERIC REPAIR;  Surgeon: Judeth Horn, MD;  Location: [facility];  Service: General;;  . CHEST TUBE INSERTION Right 02/11/2016   Procedure: CHEST TUBE INSERTION;  Surgeon: Judeth Horn, MD;  Location: [facility];  Service: General;  Laterality: Right;  . CHEST TUBE INSERTION Right 02/26/2016   Procedure: CHEST TUBE INSERTION;  Surgeon: Ivin Poot, MD;  Location: [facility];  Service: Thoracic;  Laterality: Right;  . CHOLECYSTECTOMY  02/09/2016   Procedure: CHOLECYSTECTOMY;  Surgeon: Judeth Horn, MD;  Location: [facility];  Service: General;;  . ESOPHAGOGASTRODUODENOSCOPY N/A 02/10/2016   Procedure: ESOPHAGOGASTRODUODENOSCOPY (EGD);  Surgeon: Doran Stabler, MD;  [facility]COPY;  Service: Endoscopy;  Laterality: N/A;  . EXTERNAL FIXATION PELVIS  02/09/2016   Procedure: EXTERNAL FIXATION PELVIS;  Surgeon: Altamese Schellsburg, MD;  Location: [place];  Service: Orthopedics;;  . HEMATOMA EVACUATION Right 05/14/2016   Procedure: EVACUATION HEMATOMA;  Surgeon: Gaye Pollack, MD;  Location: [facility];  Service: Thoracic;  Laterality: Right;  Evacuation of Hematoma Right Chest  . LACERATION REPAIR  02/09/2016   Procedure: REPAIR LIVER LACERATION;  Surgeon: Judeth Horn, MD;  Location: [place];  Service: General;;  . LAPAROTOMY N/A 02/10/2016   Procedure: EXPLORATORY LAPAROTOMY, removal of  packs,  cauterization of liver, repacking of liver, and open abdomen vac application;  Surgeon: Georganna Skeans, MD;  Location: [place];  Service: General;  Laterality: N/A;  . LAPAROTOMY N/A 02/11/2016   Procedure: EXPLORATORY LAPAROTOMY VAC CHANGE ;  Surgeon: Judeth Horn, MD;  Location: [place];  Service: General;  Laterality: N/A;  . LAPAROTOMY N/A 02/13/2016    Procedure: EXPLORATORY LAPAROTOMY, REMOVAL OF PACKS, ABDOMINAL VAC DRESSING CHANGE;  Surgeon: Georganna Skeans, MD;  Location: [place];  Service: General;  Laterality: N/A;  . LAPAROTOMY N/A 02/18/2016   Procedure: EXPLORATORY LAPAROTOMY, PLACEMENT OF ABRA ABDOMINAL WALL CLOSURE SET;  Surgeon: Judeth Horn, MD;  Location: [place];  Service: General;  Laterality: N/A;  . LAPAROTOMY N/A 02/09/2016   Procedure: EXPLORATORY LAPAROTOMY;  Surgeon: Judeth Horn, MD;  Location: [place];  Service: General;  Laterality: N/A;  . LAPAROTOMY N/A 02/16/2016   Procedure: EXPLORATORY LAPAROTOMY, ABDOMINAL [place];  Surgeon: Georganna Skeans, MD;  Location: [place];  Service: General;  Laterality: N/A;  . PERCUTANEOUS TRACHEOSTOMY N/A 03/01/2016   Procedure: PERCUTANEOUS TRACHEOSTOMY;  Surgeon: Judeth Horn, MD;  Location: [place];  Service: General;  Laterality: N/A;  . SACRO-ILIAC PINNING Right 02/16/2016   Procedure: [place];  Surgeon: Altamese , MD;  Location: [facility];  Service: Orthopedics;  Laterality: Right;  . TEE WITHOUT CARDIOVERSION N/A 05/14/2016   Procedure: TRANSESOPHAGEAL ECHOCARDIOGRAM (TEE);  Surgeon: Gaye Pollack, MD;  Location[facility] OR;  Service: Thoracic;  Laterality: N/A;  . THORACOTOMY/LOBECTOMY Right 04/26/2016   Procedure: Right THORACOTOMY AND DRAINAGE OF EMPYEMA;  Surgeon: Gaye Pollack, MD;  Location: [place];  Service: Thoracic;  Laterality: Right;  Marland Kitchen VIDEO ASSISTED THORACOSCOPY (VATS)/THOROCOTOMY Right 05/14/2016   Procedure: RIGHT VIDEO ASSISTED THORACOSCOPY (VATS),DRAINAGE OF EMPYEMA;  Surgeon: Gaye Pollack, MD;  Location: [place];  Service: Thoracic;  Laterality: Right;  . WOUND DEBRIDEMENT N/A 03/01/2016   Procedure: ABDOMINAL WOUND CLOSURE;  Surgeon: Judeth Horn, MD;  Location: [place];  Service: General;  Laterality: N/A;    Assessment / Plan / Recommendation Clinical Impression [name] Bell is a 35 y.o. female who was admitted on 02/09/16 after suicide attempt. She drank drano, jumped out  of 5th floor hotel room and left DNR note.  She sustained multiple injuries including right rib fractures with HPTX treated with chest tube,  Pelvic fracture requiring SI pinning 4/10, multiple abdominal procedures including cholecystectomy, bowel resection and wound VAC and closure by secondary intention. Multiple left wrist fractures splinted and WBAT per Ddr. Caralyn Guile,  tracheostomy for VDRF and PEG placement of nutritional support. She developed empyema requiring thoracotomy for drainage on 06/20 by Dr. Cyndia Bent.  She tolerated extubation and decannulation without difficulty. Psychiatry following for input and medication management--for transfer to [facility] once chest tubes removed.  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 CT venogram without evidence of cortical or sagittal sinus thrombus.  She developed new onset seizure on 07/05 most likely due to hemorrhagic transformation and was loaded with Vimpat.   TEE 7/7 negative for vegetation or thrombus. She developed recurrent right hemothorax requiring right thoracotomy with drainage on 07/07. Neurology following for input and repeat MRI brain 7/13 showed resolution of right frontal diffusion abnormality suggestive of late subacute to early chronic hemorrhage of unclear etiology. Dr. Leonie Man questioned delayed hemorrhagic contusion from  fall v/s cryptogenic infarct of unknown cause and recommends ASA once bloody pleural effusion resolves and H/H stable. CT removed on 07/17 and follow up CXR showed good resolution. Oral intake improving. She is WBAT BUE/BLE  Pt participated in cognitive-linguistic assessment and performed WNL in all areas assessed. No further SLP services indicated at this time. Pt verbalized understanding of her neurological insult including  rationale for assessment, but agreed that she feels she has returned to baseline function. Pt reports that she is diagnosed with ADD and uses environmental control to compensate for easy distraction.   Skilled Therapeutic Interventions          Pt participated in multiple subtests of the Cognitive Linguistic Quick Test (CLQT) which were all completed WNL- immediate recall, complex trail making (alternating attention) and design memory. Pt completed complex functional math word problems with 100% acc mod I level. Pt demonstrated complex reading comprehension with 100% acc. Pt was able to demonstrate recall of recent hospital events, "Mickel Baas, the speech therapist I saw last had me draw a cube- that was hard." Pt demonstrated ability to write at the sentence level WNL. Pt was able to verbally sequence a complex functional task at mod I for self-correction. Pt demonstrated excellent self-monitoring and correction as needed.  SLP Assessment  Patient does not need any further Speech Lanaguage Pathology Services    Recommendations  Follow up Recommendations: None                     Pain Pain Assessment Pain Assessment: No/denies pain  Prior Functioning Cognitive/Linguistic Baseline: Within functional limits Type of Home: House  Lives With: Family Available Help at Discharge: Family;Available PRN/intermittently  Function:  Eating Eating                 Cognition Comprehension Comprehension assist level: Follows complex conversation/direction with no assist  Expression   Expression assist level: Expresses complex ideas: With no assist  Social Interaction Social Interaction assist level: Interacts appropriately with others with medication or extra time (anti-anxiety, antidepressant).  Problem Solving Problem solving assist level: Solves complex problems: Recognizes & self-corrects  Memory Memory assist level: Complete Independence: No helper   Short Term Goals: No short term goals  set  Refer to Care Plan for Long Term Goals  Recommendations for other services: Neuropsych  Discharge Criteria: Patient will be discharged from SLP if patient refuses treatment 3 consecutive times without medical reason, if treatment goals not met, if there is a change in medical status, if patient makes no progress towards goals or if patient is discharged from hospital.  The above assessment, treatment plan, treatment alternatives and goals were discussed and mutually agreed upon: by patient  Vinetta Bergamo MA, CCC-SLP 06/05/2016, 1:39 PM

## 2016-06-05 NOTE — Progress Notes (Signed)
Physical Therapy Note  Patient Details  Name: [name] Bell MRN: [id number] Date of Birth: 12-09-1980 Today's Date: 06/05/2016    Time: 1100-1145 45 minutes  1:1 No c/o pain, pt c/o fatigue from beginning of session. Pt requires mod A for bed mobility for trunk and B LEs.  Mod A sit to stand from bed height.  Min A w/c mobility with B UEs in controlled environment, assist needed for steering due to L UE weakness.  Treatment focused on sit to stands from a variety of surfaces and short distance gait training. Pt able to gait up to 50' during treatment session with min A with RW. Pt requires min-mod A for sit to stand due to LE weakness and fatigue.  Pt requests to end session early due to fatigue. Pt mod A to go sit to supine and mod A to scoot up in bed.   Shantal Roan 06/05/2016, 11:53 AM

## 2016-06-06 ENCOUNTER — Inpatient Hospital Stay (HOSPITAL_COMMUNITY): Payer: Self-pay

## 2016-06-06 ENCOUNTER — Inpatient Hospital Stay (HOSPITAL_COMMUNITY): Payer: Medicaid Other | Admitting: Physical Therapy

## 2016-06-06 DIAGNOSIS — K5900 Constipation, unspecified: Secondary | ICD-10-CM

## 2016-06-06 DIAGNOSIS — I6340 Cerebral infarction due to embolism of unspecified cerebral artery: Secondary | ICD-10-CM

## 2016-06-06 MED ORDER — PSYLLIUM 95 % PO PACK
1.0000 | PACK | Freq: Every day | ORAL | Status: DC
  Administered 2016-06-06: 1 via ORAL
  Filled 2016-06-06 (×10): qty 1

## 2016-06-06 MED ORDER — SENNOSIDES-DOCUSATE SODIUM 8.6-50 MG PO TABS
2.0000 | ORAL_TABLET | Freq: Two times a day (BID) | ORAL | Status: DC
  Administered 2016-06-06 – 2016-06-17 (×20): 2 via ORAL
  Filled 2016-06-06 (×22): qty 2

## 2016-06-06 NOTE — Progress Notes (Signed)
Physical Therapy Session Note  Patient Details  Name: AMELAH ZIENTARA MRN: [id number] Date of Birth: 1981-07-15  Today's Date: 06/06/2016 PT Individual Time: 0800-0900 PT Individual Time Calculation (min): 60 min    Short Term Goals: Week 1:  PT Short Term Goal 1 (Week 1): Pt will perform transfers with overall min assist PT Short Term Goal 2 (Week 1): Pt will be able to gait x 150' with min assist PT Short Term Goal 3 (Week 1): Pt will be able to perform dynamic standing balance with overall min assist PT Short Term Goal 4 (Week 1): Assessment by orthotist will be completed if appropriate to assess need for AFO for LLE.  Skilled Therapeutic Interventions/Progress Updates:    Patient in bed upon arrival. Session focused on LB dressing in long sitting in bed with setup assist and pulling up pants over hips with sit <> stand from EOB with min A for standing balance using RW, bed mobility training with HOB raised and use of rail with mod A overall and cues for technique, multiple sit <> stand transfers and ambulatory transfers from bed and wheelchair using RW with min-mod A overall, gait training using RW x 80 ft with min A, stair training up/down 3 (6") stairs using 2 rails with verbal cues for sequencing and mod A overall, wheelchair propulsion using BUE with supervision-min A in controlled environment up to 150 ft with cues for technique, and switching out 20 x 16 wheelchair for 18 x 18 wheelchair for improved sitting tolerance and positioning. Patient requested to sit edge of bed to finish breakfast with sitter present and call bell in reach.   Therapy Documentation Precautions:  Precautions Precautions: Fall, Other (comment) Precaution Comments: left sided weakness; sucide precautions Other Brace/Splint: L PRAFO in bed Restrictions Weight Bearing Restrictions: No RUE Weight Bearing: Weight bearing as tolerated LUE Weight Bearing: Weight bearing as tolerated RLE Weight Bearing: Weight  bearing as tolerated LLE Weight Bearing: Weight bearing as tolerated Pain: Pain Assessment Pain Assessment: No/denies pain   See Function Navigator for Current Functional Status.   Therapy/Group: Individual Therapy  Kerney Elbe 06/06/2016, 8:52 AM

## 2016-06-06 NOTE — Progress Notes (Signed)
Occupational Therapy Session Note  Patient Details  Name: [name] Bell MRN: [id number] Date of Birth: [birth year]  Today's Date: 06/06/2016 OT Individual Time: 1100-1200 OT Individual Time Calculation (min): 60 min     Short Term Goals: Week 1:  OT Short Term Goal 1 (Week 1): Pt will transfer to toilet/ BSC with minA with LRAD OT Short Term Goal 2 (Week 1): Pt will don LB clothing sit to stand with min A with AE prn  OT Short Term Goal 3 (Week 1): Pt will perform 2 grooming tasks in standing at sink wiht min A OT Short Term Goal 4 (Week 1): Pt will demonstrate emergent awareness in ADL with min questioning cues  Skilled Therapeutic Interventions/Progress Updates:    Pt resting in bed with sitter and sister present.  Pt sat long sitting in bed to don socks before moving legs to side of bed in preparation for sit<>stand from EOB.  Pt required steady A for sit<>stand and amb with RW approx 5' to sit in w/c.  Pt initially propelled w/c on the way to the gym.  Pt noted with difficulty maintaining straight path.  Pt with LUE weakness but she was unable to compensate.  Pt transferred to therapy mat and engaged in BUE therex and LUE therex.  Pt with significant bilateral shoulder weakness. Pt returned to room and bed and remained in bed with all needs within reach and sitter present.  Pt's w/c and RW were removed from room. Therapy Documentation Precautions:  Precautions Precautions: Fall, Other (comment) Precaution Comments: left sided weakness; sucide precautions Other Brace/Splint: L PRAFO in bed Restrictions Weight Bearing Restrictions: No RUE Weight Bearing: Weight bearing as tolerated LUE Weight Bearing: Weight bearing as tolerated RLE Weight Bearing: Weight bearing as tolerated LLE Weight Bearing: Weight bearing as tolerated   Pain: Pain Assessment Pain Assessment: No/denies pain ADL: ADL ADL Comments: see functional navigator  See Function Navigator for Current Functional  Status.   Therapy/Group: Individual Therapy  Rich Brave 06/06/2016, 12:12 PM

## 2016-06-06 NOTE — Progress Notes (Signed)
Patient has voided in diaper 3-4 times during weekend while sitter or nurse in the room without asking for assistance to toilet. Discussed reasons why patient was not asking to use bathroom. She says she knows shen she needs to go, but was "stiff" and did not want to get up. Encouraged her to let us know prior so we can get her to bathroom to avoid skin breakdown. Provided education both days. Patient vocalized understanding.

## 2016-06-06 NOTE — IPOC Note (Signed)
Overall Plan of Care [facility]) Patient Details Name: [name] Bell MRN: [id number] DOB: September 14, 1981  Admitting Diagnosis: Suicide Attempt Poly Trauma  Hospital Problems: Principal Problem:   Traumatic brain injury with loss of consciousness of 1 hour to 5 hours 59 minutes (HCC) Active Problems:   Acute blood loss anemia   Suicide attempt (HCC)   Major depressive disorder, recurrent severe without psychotic features (HCC)   Cerebral embolism with cerebral infarction   S/P thoracotomy   Left hemiparesis (HCC)     Functional Problem List: Nursing Safety, Bowel, Endurance, Medication Management, Skin Integrity, Nutrition  PT Balance, Behavior, Endurance, Motor, Safety, Sensory, Skin Integrity  OT Balance, Pain, Perception, Behavior, Cognition, Edema, Safety, Sensory, Skin Integrity, Endurance, Motor, Vision  SLP    TR         Basic ADL's: OT Grooming, Bathing, Dressing, Toileting     Advanced  ADL's: OT       Transfers: PT Bed Mobility, Bed to Chair, Car, Occupational psychologist, Research scientist (life sciences): PT Ambulation, Stairs     Additional Impairments: OT Fuctional Use of Upper Extremity  SLP None      TR      Anticipated Outcomes Item Anticipated Outcome  Self Feeding n/a  Swallowing      Basic self-care  supervision  Toileting  supervision   Bathroom Transfers supervision  Bowel/Bladder  manage bowel and bladder with mod I assist  Transfers  supervision overall  Locomotion  supervision gait; min assist stairs  Communication     Cognition     Pain  Pain controlled with prn med at or below level 4  Safety/Judgment      Therapy Plan: PT Intensity: Minimum of 1-2 x/day ,45 to 90 minutes PT Frequency: 5 out of 7 days PT Duration Estimated Length of Stay: 14 days OT Intensity: Minimum of 1-2 x/day, 45 to 90 minutes OT Frequency: 5 out of 7 days OT Duration/Estimated Length of Stay: 2-3 weeks         Team Interventions: Nursing Interventions  Patient/Family Education, Medication Management, Discharge Planning, Bowel Management, Skin Care/Wound Management, Psychosocial Support, Cognitive Remediation/Compensation, Disease Management/Prevention  PT interventions Ambulation/gait training, Balance/vestibular training, Cognitive remediation/compensation, Discharge planning, Disease management/prevention, DME/adaptive equipment instruction, Functional mobility training, Neuromuscular re-education, Pain management, Patient/family education, Psychosocial support, Skin care/wound management, Splinting/orthotics, Stair training, Therapeutic Activities, Therapeutic Exercise, UE/LE Strength taining/ROM, UE/LE Coordination activities, Wheelchair propulsion/positioning  OT Interventions Warden/ranger, Disease mangement/prevention, Firefighter, Neuromuscular re-education, Patient/family education, Self Care/advanced ADL retraining, Splinting/orthotics, Therapeutic Exercise, UE/LE Coordination activities, Wheelchair propulsion/positioning, Visual/perceptual remediation/compensation, UE/LE Strength taining/ROM, Therapeutic Activities, Skin care/wound managment, Psychosocial support, Pain management, Functional mobility training, DME/adaptive equipment instruction, Discharge planning, Cognitive remediation/compensation  SLP Interventions    TR Interventions    SW/CM Interventions Discharge Planning, Psychosocial Support, Patient/Family Education    Team Discharge Planning: Destination: PT- (Home vs. Psych Admission) ,OT- Home , SLP-  Projected Follow-up: PT-24 hour supervision/assistance, OT-  Outpatient OT, 24 hour supervision/assistance, SLP-None Projected Equipment Needs: PT-Rolling walker with 5" wheels, OT- To be determined, SLP-  Equipment Details: PT-May need AFO prior to d/c, OT-  Patient/family involved in discharge planning: PT- Patient, Family member/caregiver,  OT-Patient, SLP-Patient  MD ELOS: 14-17 days Medical Rehab  Prognosis:  Excellent Assessment: The patient has been admitted for CIR therapies with the diagnosis of TBI/polytrauma/CVA. The team will be addressing functional mobility, strength, stamina, balance, safety, adaptive techniques and equipment, self-care, bowel and bladder mgt, patient and caregiver education,  NMR, ortho precautions, pain mgt, behavior/psych issues, community transition, w/c assessment. Goals have been set at supervision for basic mobility and self-care. Will require supervision and intense outpt psych follow up given history. Will determine needs for future psych follow up while here.   Ranelle Oyster, MD, FAAPMR      See Team Conference Notes for weekly updates to the plan of care

## 2016-06-06 NOTE — Progress Notes (Signed)
Complained of feeling constipated, requesting senna increased. PRN Oxy IR given at 2258, with relief. [name] Bell A

## 2016-06-06 NOTE — Progress Notes (Signed)
[facility]     PROGRESS NOTE    Subjective/Complaints: Complains of constipation. Had to be disimpacted. Otherwise feeling well.    ROS: Pt denies fever, rash/itching, headache, blurred or double vision, nausea, vomiting, abdominal pain, diarrhea, chest pain, shortness of breath, palpitations, dysuria, dizziness,  , bleeding, acute anxiety, or depression   Objective: Vital Signs: Blood pressure 128/76, pulse 67, temperature 98.9 F (37.2 C), temperature source Oral, resp. rate 16, height  (1.803 m), weight 81 kg (178 lb 9.6 oz), SpO2 100 %. No results found.  Recent Labs  06/04/16 0453  WBC 7.0  HGB 9.5*  HCT 30.1*  PLT 237    Recent Labs  06/04/16 0453  NA 140  K 3.6  CL 102  GLUCOSE 100*  BUN 14  CREATININE 0.87  CALCIUM 10.2   CBG (last 3)  No results for input(s): GLUCAP in the last 72 hours.  Wt Readings from Last 3 Encounters:  06/06/16 81 kg (178 lb 9.6 oz)  06/03/16 79.5 kg (175 lb 3.2 oz)  05/20/16 81.6 kg (180 lb)    Physical Exam:  Constitutional: She is oriented to person, place, and time. She appears well-developedand well-nourished.  HENT:  Head: Normocephalicand atraumatic.  Mouth/Throat: Oropharynx is clear and moist.  Eyes: Conjunctivaeare normal. Pupils are equal, round, and reactive to light. Right eye exhibits no discharge. No scleral icterus.  Neck: Normal range of motion. Neck supple.  Cardiovascular: Regular rhythm. Tachycardiapresent.  Murmurheard. Respiratory: Effort normaland breath sounds normal. No stridor. No respiratory distress. She has no wheezes.  Right lateral chest incision has healed well. Small  pink area at prior chest tube site.  GI: Soft. Bowel sounds are normal. She exhibits no distension. There is no tenderness.  Midline incision healing well--superficial with beefy red tissue. Healed scars noted from retention sutures.  Musculoskeletal: She exhibits edema.   Neurological: She is alertand oriented to person, place, and time.  Speech clear. Able to follow commands without difficulty. LLE with sustained clonus.  Motor strength: 5-/5 in the right deltoid, biceps, triceps, grip, 4/5  hip flexor, knee extensor, ankle dorsiflexor. 3+ left deltoid, biceps, triceps, grip 3+/5, left hip flexor, 0 at the knee extensor, 2  left hip extensor, 0 at the ankle dorsiflexor and plantar flexor. Sensation mildly reduced to light touch in the left versus right upper and lower limb No evidence of facial droop Skin: Skin is warmand dry. No rashnoted. No erythema.  Psychiatric: Her behavior is normal. Thought contentnormal.  In good spirits today. cooperative     Assessment/Plan: 1. Left hemiparesis and functional/cognitive deficits secondary to right MCA infarct/TBI/polytrauma which require 3+ hours per day of interdisciplinary therapy in a comprehensive inpatient rehab setting. Physiatrist is providing close team supervision and 24 hour management of active medical problems listed below. Physiatrist and rehab team continue to assess barriers to discharge/monitor patient progress toward functional and medical goals.  Function:  Bathing Bathing position   Position: Shower  Bathing parts Body parts bathed by patient: Right arm, Left arm, Chest, Abdomen, Front perineal area, Right upper leg, Left upper leg Body parts bathed by helper: Buttocks, Right lower leg, Left lower leg, Back  Bathing assist Assist Level: Touching or steadying assistance(Pt > 75%)      Upper Body Dressing/Undressing Upper body dressing   What is the patient wearing?: Pull over shirt/dress     Pull over shirt/dress - Perfomed by patient: Thread/unthread right sleeve, Thread/unthread left sleeve, Put head  through opening, Pull shirt over trunk Pull over shirt/dress - Perfomed by helper: Thread/unthread right sleeve, Thread/unthread left sleeve, Pull shirt over trunk        Upper  body assist Assist Level: Set up      Lower Body Dressing/Undressing Lower body dressing   What is the patient wearing?: Non-skid slipper socks, Pants Underwear - Performed by patient: Pull underwear up/down Underwear - Performed by helper: Thread/unthread left underwear leg, Thread/unthread right underwear leg Pants- Performed by patient: Pull pants up/down Pants- Performed by helper: Thread/unthread right pants leg, Thread/unthread left pants leg Non-skid slipper socks- Performed by patient: Don/doff right sock, Don/doff left sock Non-skid slipper socks- Performed by helper: Don/doff right sock, Don/doff left sock                  Lower body assist Assist for lower body dressing: Touching or steadying assistance (Pt > 75%)      Toileting Toileting Toileting activity did not occur: Refused Toileting steps completed by patient: Adjust clothing prior to toileting, Performs perineal hygiene, Adjust clothing after toileting Toileting steps completed by helper: Adjust clothing prior to toileting, Performs perineal hygiene, Adjust clothing after toileting Toileting Assistive Devices: Grab bar or rail  Toileting assist Assist level: Touching or steadying assistance (Pt.75%)   Transfers Chair/bed transfer   Chair/bed transfer method: Stand pivot Chair/bed transfer assist level: Moderate assist (Pt 50 - 74%/lift or lower) Chair/bed transfer assistive device: Armrests     Locomotion Ambulation     Max distance: 5'     Wheelchair       Assist Level: Dependent (Pt equals 0%)  Cognition Comprehension Comprehension assist level: Follows complex conversation/direction with no assist  Expression Expression assist level: Expresses complex ideas: With no assist  Social Interaction Social Interaction assist level: Interacts appropriately with others with medication or extra time (anti-anxiety, antidepressant).  Problem Solving Problem solving assist level: Solves complex problems:  Recognizes & self-corrects  Memory Memory assist level: Complete Independence: No helper   Medical Problem List and Plan: 1. Left  hemiparesis and cognitive deficits secondary to right MCA infarct and traumatic brain injury  -continue therapies 2. DVT Prophylaxis/Anticoagulation:   Lovenox indicated  -dopplers negative 3. Pain Management: tylenol prn 4. Mood: Patient denies any anxiety, depressive symptoms, insomnia, nightmares or intent for self harm.   -psychiatry recommending inpatient commitment to psych hospital. Pt/sister do not want this   -will follow and address as she progresses through admission  -this patient does not appear to be a flight risk at this time 5. Neuropsych: This patient is not fullycapable of making decisions on herown behalf. 6. Skin/Wound Care: Monitor wound for healing.  7. Fluids/Electrolytes/Nutrition: Monitor by mouth intake, follow-up on bmet 8. Abdominal procedures with bowel resection: Po intake has been good.   -Continue bid dressing change to midline incision. 9. TBI/R-MCA infarct with DAI with hemorrhage: ASA added 10 New onset seizures: Controlled on vimpat bid. 11. Empyema RLL s/p VATS: Resolved. Respiratory status stable.  12. Constipation: felt she wasn'Bell getting senna-s  (she was)  - Doesn'Bell tolerate miralax  -add metamucil  -increase senna-s to 2tab bid .  -encouraged fruits/veggies/water 13 Depression: Mood appears stable on Zoloft daily and on Klonopin at bedtime.  14. ABLA: on iron supplement.    LOS (Days) 3 A FACE TO FACE EVALUATION WAS PERFORMED  [name] Bell 06/06/2016 7:58 AM

## 2016-06-07 ENCOUNTER — Encounter (HOSPITAL_COMMUNITY): Payer: Self-pay

## 2016-06-07 ENCOUNTER — Inpatient Hospital Stay (HOSPITAL_COMMUNITY): Payer: Self-pay

## 2016-06-07 ENCOUNTER — Inpatient Hospital Stay (HOSPITAL_COMMUNITY): Payer: Medicaid Other | Admitting: Occupational Therapy

## 2016-06-07 ENCOUNTER — Encounter (HOSPITAL_COMMUNITY): Payer: Self-pay | Admitting: *Deleted

## 2016-06-07 ENCOUNTER — Inpatient Hospital Stay (HOSPITAL_COMMUNITY): Payer: Medicaid Other | Admitting: Physical Therapy
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
## 2016-06-07 NOTE — Progress Notes (Signed)
Occupational Therapy Session Note  Patient Details  Name: [name] Bell MRN: [id number] Date of Birth: 10-May-1981  Today's Date: 06/07/2016 OT Individual Time: 1000-1100 OT Individual Time Calculation (min): 60 min     Short Term Goals:Week 1:  OT Short Term Goal 1 (Week 1): Pt will transfer to toilet/ BSC with minA with LRAD OT Short Term Goal 2 (Week 1): Pt will don LB clothing sit to stand with min A with AE prn  OT Short Term Goal 3 (Week 1): Pt will perform 2 grooming tasks in standing at sink wiht min A OT Short Term Goal 4 (Week 1): Pt will demonstrate emergent awareness in ADL with min questioning cues  Skilled Therapeutic Interventions/Progress Updates:    Pt seen for OT ADL bathing/dressing session. Pt sitting up in w/c upon arrival with hand off from PT. Pt declined full showering task this session, opting to bathe sitting in w/c at sink. Pt required increased assist with management of L LE during bathing/dressing tasks due to weakness. With assist, she was able to cross L ankle over knee in order to reach to bathe/dress L LE. Educated regarding hemi dressing technique for LB dressing.  She completed oral care standing at sink, requiring CGA for balance. She was able to tolerate ~2 minutes of standing at sink, which she says is great improvement from previous sessions.  Pt required extended seated rest breaks throughout session due to decreased activity tolerance. During rest breaks, Educated regarding 3:1 hospital stay to recovery ratio, energy conservation, importance of participation with ADLs and increasing cardio respiratory endurance.  Pt requested return to supine at end of session, assist required for positioning in bed. Pt left in supine with all needs in reach and sitter present.  All items removed from room per protocol.  Therapy Documentation Precautions:  Precautions Precautions: Fall, Other (comment) Precaution Comments: left sided weakness; sucide  precautions Other Brace/Splint: L PRAFO in bed Restrictions Weight Bearing Restrictions: No RUE Weight Bearing: Weight bearing as tolerated LUE Weight Bearing: Weight bearing as tolerated RLE Weight Bearing: Weight bearing as tolerated LLE Weight Bearing: Weight bearing as tolerated Pain:   Denied pain, however, stated "soreness all over". Pt repositioned and rest breaks provided ADL: ADL ADL Comments: see functional navigator  See Function Navigator for Current Functional Status.   Therapy/Group: Individual Therapy  Lewis, Daviona Herbert C 06/07/2016, 7:17 AM

## 2016-06-07 NOTE — Progress Notes (Signed)
Physical Therapy Session Note  Patient Details  Name: [name] Bell MRN: [id number] Date of Birth: 1981/10/08  Today's Date: 06/07/2016 PT Individual Time: 1500-1530 PT Individual Time Calculation (min): 30 min    Short Term Goals: Week 1:  PT Short Term Goal 1 (Week 1): Pt will perform transfers with overall min assist PT Short Term Goal 2 (Week 1): Pt will be able to gait x 150' with min assist PT Short Term Goal 3 (Week 1): Pt will be able to perform dynamic standing balance with overall min assist PT Short Term Goal 4 (Week 1): Assessment by orthotist will be completed if appropriate to assess need for AFO for LLE.  Skilled Therapeutic Interventions/Progress Updates:   Pt received supine in bed; denies pain and agreeable to treatment. Supine>sit minA using bedrails and increased time for LLE off EOB. Therapist threaded pants and donned shoes totalA for time management. Sit <>stand minA x4 throughout session minA for boosting hips. In standing, pt took off brief and pulled up pants with RW and S. Gait to sink x5' with RW and S. Gait leaving room into hallway for total 40' before requiring seated rest break due to pt reporting significant fatigue. Pt propelled to gym with BUE for strengthening and endurance. Gait x75' with RW and min guard; ace wrap donned to LLE for dorsiflexion A per pt request, stating she feels more stable with it on and therapist notes increase in gait speed. Returned to room totalA for energy conservation. Stand pivot to return to bed with RW and minA for sit <>stand, S for transfer. Sit >supine modA for LEs into bed. Bridging with minA to doff pants, and rolling R/L with S to don brief. Remained supine in bed with sitter present and all needs in reach, items removed from room.   Therapy Documentation Precautions:  Precautions Precautions: Fall, Other (comment) Precaution Comments: left sided weakness; sucide precautions Other Brace/Splint: L PRAFO in  bed Restrictions Weight Bearing Restrictions: No RUE Weight Bearing: Weight bearing as tolerated LUE Weight Bearing: Weight bearing as tolerated RLE Weight Bearing: Weight bearing as tolerated LLE Weight Bearing: Weight bearing as tolerated   See Function Navigator for Current Functional Status.   Therapy/Group: Individual Therapy  Vista Lawman 06/07/2016, 4:00 PM

## 2016-06-07 NOTE — Progress Notes (Signed)
Physical Therapy Session Note  Patient Details  Name: PERSAYIS WEAD MRN: [id number] Date of Birth: 1981-06-20  Today's Date: 06/07/2016 PT Individual Time: 0900-1000 PT Individual Time Calculation (min): 60 min    Short Term Goals: Week 1:  PT Short Term Goal 1 (Week 1): Pt will perform transfers with overall min assist PT Short Term Goal 2 (Week 1): Pt will be able to gait x 150' with min assist PT Short Term Goal 3 (Week 1): Pt will be able to perform dynamic standing balance with overall min assist PT Short Term Goal 4 (Week 1): Assessment by orthotist will be completed if appropriate to assess need for AFO for LLE.  Skilled Therapeutic Interventions/Progress Updates:    Session focused on functional bed mobility with verbal cues for technique, functional transfers (sit <> stands, stand pivots, and ambulatory), neuro re-ed for balance re-training, forced use of LLE in standing, and functional use of LUE for reaching task, gait training with RW with min assist and improved L foot clearance (recommending wearing shoes due to discomfort in foot when walking on tile), and overall endurance/activity tolerance. Neuro re-ed on non-compliant surface with 1 or no UE support while performing reaching task and progressed to 4" block under RLE (required increased mod assist for sit -> stand) during balance activities. Administered TUG to assess fall risk. See results below. Handoff to OT end of session. Sitter present during session.  TUG Trial 1: 54 sec; Trial 2: 36 sec; Trial 3: 33 sec   Therapy Documentation Precautions:  Precautions Precautions: Fall, Other (comment) Precaution Comments: left sided weakness; sucide precautions Other Brace/Splint: L PRAFO in bed Restrictions Weight Bearing Restrictions: No RUE Weight Bearing: Weight bearing as tolerated LUE Weight Bearing: Weight bearing as tolerated RLE Weight Bearing: Weight bearing as tolerated LLE Weight Bearing: Weight bearing as  tolerated   Pain: Denies pain. Reports soreness and upset stomach  See Function Navigator for Current Functional Status.   Therapy/Group: Individual Therapy  Karolee Stamps Darrol Poke, PT, DPT  06/07/2016, 11:00 AM

## 2016-06-07 NOTE — Progress Notes (Signed)
[place] PHYSICAL MEDICINE & REHABILITATION     PROGRESS NOTE    Subjective/Complaints: Happy that bowels are moving. Had a good day. A little apprehensive about busy day of "PT" ahead.  ROS: Pt denies fever, rash/itching, headache, blurred or double vision, nausea, vomiting, abdominal pain, diarrhea, chest pain, shortness of breath, palpitations, dysuria, dizziness,  , bleeding, acute anxiety, or depression   Objective: Vital Signs: Blood pressure 140/83, pulse 68, temperature 98.5 F (36.9 C), temperature source Oral, resp. rate 17, height  (1.803 m), weight 79 kg (174 lb 3.2 oz), SpO2 98 %. No results found. No results for input(s): WBC, HGB, HCT, PLT in the last 72 hours. No results for input(s): NA, K, CL, GLUCOSE, BUN, CREATININE, CALCIUM in the last 72 hours.  Invalid input(s): CO CBG (last 3)  No results for input(s): GLUCAP in the last 72 hours.  Wt Readings from Last 3 Encounters:  06/07/16 79 kg (174 lb 3.2 oz)  06/03/16 79.5 kg (175 lb 3.2 oz)  05/20/16 81.6 kg (180 lb)    Physical Exam:  Constitutional: She is oriented to person, place, and time. She appears well-developedand well-nourished.  HENT:  Head: Normocephalicand atraumatic.  Mouth/Throat: Oropharynx is clear and moist.  Eyes: Conjunctivaeare normal. Pupils are equal, round, and reactive to light. Right eye exhibits no discharge. No scleral icterus.  Neck: Normal range of motion. Neck supple.  Cardiovascular: Regular rhythm. Tachycardiapresent.  Murmurheard. Respiratory: Effort normaland breath sounds normal. No stridor. No respiratory distress. She has no wheezes.  Right lateral chest incision has healed well. GI: Soft. Bowel sounds are normal. She exhibits no distension. There is no tenderness.  Midline incision healing well--superficial with beefy red tissue--closing. Healed scars noted from retention sutures.  Musculoskeletal: She exhibits edema.  Neurological: She is alertand  oriented to person, place, and time.  Speech clear. Able to follow commands without difficulty. LLE with sustained clonus.  Motor strength: 5-/5 in the right deltoid, biceps, triceps, grip, 4/5  hip flexor, knee extensor, ankle dorsiflexor. 3+ to 4 left deltoid, biceps, triceps, grip 3+/5, left hip flexor, 0 at the knee extensor, 2  left hip extensor, 0-1 at the ankle dorsiflexor and plantar flexor. Sensation mildly reduced to light touch in the left versus right upper and lower limb No evidence of facial droop Skin: Skin is warmand dry. No rashnoted. No erythema.  Psychiatric: Her behavior is normal. Thought contentnormal.  Pleasant, cooperative     Assessment/Plan: 1. Left hemiparesis and functional/cognitive deficits secondary to right MCA infarct/TBI/polytrauma which require 3+ hours per day of interdisciplinary therapy in a comprehensive inpatient rehab setting. Physiatrist is providing close team supervision and 24 hour management of active medical problems listed below. Physiatrist and rehab team continue to assess barriers to discharge/monitor patient progress toward functional and medical goals.  Function:  Bathing Bathing position   Position: Shower  Bathing parts Body parts bathed by patient: Right arm, Left arm, Chest, Abdomen, Front perineal area, Right upper leg, Left upper leg Body parts bathed by helper: Buttocks, Right lower leg, Left lower leg, Back  Bathing assist Assist Level: Touching or steadying assistance(Pt > 75%)      Upper Body Dressing/Undressing Upper body dressing   What is the patient wearing?: Pull over shirt/dress     Pull over shirt/dress - Perfomed by patient: Thread/unthread right sleeve, Thread/unthread left sleeve, Put head through opening, Pull shirt over trunk Pull over shirt/dress - Perfomed by helper: Thread/unthread right sleeve, Thread/unthread left sleeve, Pull shirt  over trunk        Upper body assist Assist Level: Set up       Lower Body Dressing/Undressing Lower body dressing   What is the patient wearing?: Non-skid slipper socks, Pants Underwear - Performed by patient: Pull underwear up/down Underwear - Performed by helper: Thread/unthread left underwear leg, Thread/unthread right underwear leg Pants- Performed by patient: Pull pants up/down Pants- Performed by helper: Thread/unthread right pants leg, Thread/unthread left pants leg Non-skid slipper socks- Performed by patient: Don/doff right sock, Don/doff left sock Non-skid slipper socks- Performed by helper: Don/doff right sock, Don/doff left sock                  Lower body assist Assist for lower body dressing: Touching or steadying assistance (Pt > 75%)      Toileting Toileting Toileting activity did not occur: Refused Toileting steps completed by patient: Adjust clothing prior to toileting, Performs perineal hygiene, Adjust clothing after toileting Toileting steps completed by helper: Adjust clothing prior to toileting, Performs perineal hygiene, Adjust clothing after toileting Toileting Assistive Devices: Grab bar or rail  Toileting assist Assist level: Touching or steadying assistance (Pt.75%)   Transfers Chair/bed transfer   Chair/bed transfer method: Stand pivot, Ambulatory Chair/bed transfer assist level: Moderate assist (Pt 50 - 74%/lift or lower) Chair/bed transfer assistive device: Armrests     Locomotion Ambulation     Max distance: 80 ft Assist level: Touching or steadying assistance (Pt > 75%)   Wheelchair   Type: Manual Max wheelchair distance: 150 ft Assist Level: Touching or steadying assistance (Pt > 75%)  Cognition Comprehension Comprehension assist level: Follows complex conversation/direction with no assist  Expression Expression assist level: Expresses complex ideas: With no assist  Social Interaction Social Interaction assist level: Interacts appropriately with others with medication or extra time (anti-anxiety,  antidepressant).  Problem Solving Problem solving assist level: Solves complex problems: Recognizes & self-corrects  Memory Memory assist level: Complete Independence: No helper   Medical Problem List and Plan: 1. 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 DVT Prophylaxis/Anticoagulation:   Lovenox indicated  -dopplers negative 3. Pain Management: tylenol prn 4. Mood: Patient denies any anxiety, depressive symptoms, insomnia, nightmares or intent for self harm.   -psychiatry recommending inpatient commitment to psych hospital. Pt/sister do not want this   -need to come to decision on appropriate aftercare  -this patient does not appear to be a flight risk at this time  -neuropsychology to assess also 5. Neuropsych: This patient is not fullycapable of making decisions on herown behalf. 6. Skin/Wound Care: Monitor wound for healing.  7. Fluids/Electrolytes/Nutrition: Monitor by mouth intake, follow-up on bmet 8. Abdominal procedures with bowel resection: Po intake has been good.   -Continue bid dressing change to midline incision. 9. TBI/R-MCA infarct with DAI with hemorrhage: ASA added 10 New onset seizures: Controlled on vimpat bid. 11. Empyema RLL s/p VATS: Resolved. Respiratory status stable.  12. Constipation: improved with  Metamucil and increase of senna-s to 2 tab bid .  -encouraged fruits/veggies/water 13 Depression: Mood appears stable at present  -on Zoloft daily and on Klonopin at bedtime.  14. ABLA: on iron supplement.    LOS (Days) 4 A FACE TO FACE EVALUATION WAS PERFORMED  SWARTZ,ZACHARY T 06/07/2016 8:44 AM

## 2016-06-07 NOTE — Progress Notes (Signed)
Physical Therapy Session Note  Patient Details  Name: [name] Bell MRN: [id number] Date of Birth: 1981-08-28  Today's Date: 06/07/2016 PT Individual Time: 1300-1400 PT Individual Time Calculation (min): 60 min    Short Term Goals: Week 1:  PT Short Term Goal 1 (Week 1): Pt will perform transfers with overall min assist PT Short Term Goal 2 (Week 1): Pt will be able to gait x 150' with min assist PT Short Term Goal 3 (Week 1): Pt will be able to perform dynamic standing balance with overall min assist PT Short Term Goal 4 (Week 1): Assessment by orthotist will be completed if appropriate to assess need for AFO for LLE.  Skilled Therapeutic Interventions/Progress Updates:    Session focused on bed mobility, sit <> stands, standing balance and endurance, and w/c mobility training back to room with cues for efficient propulsion technique and hand placement. Pt required min assist for sit <> stands throughout session with verbal cues for hand placement and technique. Min assist for standing balance for pulling up pants and min/mod assist for balance during Wii Dance game (1 large LOB requiring max assist to recover). Pt difficulty tolerating standing during Wii Dance due to decreased cardiorespiratory endurance, so completed next 2 dances in seated position with UEs and LE's from seated position. End of session returned back to bed to rest before next session with sitter at bedside  .  Therapy Documentation Precautions:  Precautions Precautions: Fall, Other (comment) Precaution Comments: left sided weakness; sucide precautions Other Brace/Splint: L PRAFO in bed Restrictions Weight Bearing Restrictions: No RUE Weight Bearing: Weight bearing as tolerated LUE Weight Bearing: Weight bearing as tolerated RLE Weight Bearing: Weight bearing as tolerated LLE Weight Bearing: Weight bearing as tolerated  Pain:  No complaints.  See Function Navigator for Current Functional  Status.   Therapy/Group: Individual Therapy  Karolee Stamps Darrol Poke, PT, DPT  06/07/2016, 2:25 PM

## 2016-06-08 ENCOUNTER — Inpatient Hospital Stay (HOSPITAL_COMMUNITY): Payer: Medicaid Other | Admitting: Physical Therapy

## 2016-06-08 ENCOUNTER — Inpatient Hospital Stay (HOSPITAL_COMMUNITY): Payer: Medicaid Other | Admitting: *Deleted

## 2016-06-08 ENCOUNTER — Inpatient Hospital Stay (HOSPITAL_COMMUNITY): Payer: Self-pay | Admitting: Occupational Therapy

## 2016-06-08 ENCOUNTER — Inpatient Hospital Stay (HOSPITAL_COMMUNITY): Payer: Self-pay | Admitting: Physical Therapy

## 2016-06-08 DIAGNOSIS — S069X3S Unspecified intracranial injury with loss of consciousness of 1 hour to 5 hours 59 minutes, sequela: Secondary | ICD-10-CM

## 2016-06-08 DIAGNOSIS — D62 Acute posthemorrhagic anemia: Secondary | ICD-10-CM

## 2016-06-08 DIAGNOSIS — I63412 Cerebral infarction due to embolism of left middle cerebral artery: Secondary | ICD-10-CM

## 2016-06-08 DIAGNOSIS — F332 Major depressive disorder, recurrent severe without psychotic features: Secondary | ICD-10-CM

## 2016-06-08 DIAGNOSIS — G8194 Hemiplegia, unspecified affecting left nondominant side: Secondary | ICD-10-CM

## 2016-06-08 NOTE — Progress Notes (Signed)
Recreational Therapy Assessment and Plan  Patient Details  Name: NEA GITTENS MRN: [id number] Date of Birth: 04-14-1981 Today's Date: 06/08/2016  Rehab Potential: Good ELOS: 2 weeks   Assessment Clinical Impression: Problem List: Patient Active Problem List   Diagnosis Date Noted  . Traumatic brain injury with loss of consciousness of 1 hour to 5 hours 59 minutes ([place]) 06/03/2016  . Left hemiparesis ([place])   . S/P thoracotomy 05/14/2016  . Cerebral embolism with cerebral infarction 05/11/2016  . Major depressive disorder, recurrent severe without psychotic features ([place]) 04/12/2016  . Multiple pelvic fractures ([place]) 03/03/2016  . Left wrist fracture 03/03/2016  . Multiple fractures of ribs of right side 03/03/2016  . Fracture of multiple ribs of left side 03/03/2016  . Liver laceration 03/03/2016  . Small intestine injury 03/03/2016  . Acute blood loss anemia 03/03/2016  . Suicide attempt ([place]) 03/03/2016  . Bilateral pulmonary contusion 03/03/2016  . Acute respiratory failure ([place]) 03/03/2016  . Hypokalemia 03/03/2016  . Hypernatremia 03/03/2016  . Acute kidney injury ([place]) 03/03/2016  . Ingestion of caustic substance 03/03/2016  . Tylenol overdose 03/03/2016  . Hyperglycemia 03/03/2016  . Hepatic failure ([place]) 03/03/2016  . Pressure ulcer 02/26/2016  . Fall from, out of or through building, not otherwise specified, initial encounter 02/09/2016  . Hypovolemic shock ([place]) 02/09/2016    Past Medical History:      Past Medical History:  Diagnosis Date  . ADD (attention deficit disorder)    has been on medication  . Anemia   . Anxiety   . Depression   . H/O suicide attempt    at age 35.   Marland Kitchen PTSD (post-traumatic stress disorder)    Past Surgical History:       Past Surgical History:  Procedure Laterality Date  . APPLICATION OF WOUND VAC  02/09/2016   Procedure: APPLICATION OF WOUND VAC;  Surgeon: Judeth Horn, MD;  Location: [facility];  Service: General;;   . APPLICATION OF WOUND VAC N/A 02/16/2016   Procedure: RE-APPLICATION OF WOUND VAC;  Surgeon: Georganna Skeans, MD;  Location: [facility];  Service: General;  Laterality: N/A;  . BOWEL RESECTION  02/09/2016   Procedure: SMALL BOWEL RESECTION, MESENTERIC REPAIR;  Surgeon: Judeth Horn, MD;  Location: [place] [facility];  Service: General;;  . CHEST TUBE INSERTION Right 02/11/2016   Procedure: CHEST TUBE INSERTION;  Surgeon: Judeth Horn, MD;  Location: [facility];  Service: General;  Laterality: Right;  . CHEST TUBE INSERTION Right 02/26/2016   Procedure: CHEST TUBE INSERTION;  Surgeon: Ivin Poot, MD;  Location: [facility];  Service: Thoracic;  Laterality: Right;  . CHOLECYSTECTOMY  02/09/2016   Procedure: CHOLECYSTECTOMY;  Surgeon: Judeth Horn, MD;  Location: [facility];  Service: General;;  . ESOPHAGOGASTRODUODENOSCOPY N/A 02/10/2016   Procedure: ESOPHAGOGASTRODUODENOSCOPY (EGD);  Surgeon: Doran Stabler, MD;  Location: [facility];  Service: Endoscopy;  Laterality: N/A;  . EXTERNAL FIXATION PELVIS  02/09/2016   Procedure: EXTERNAL FIXATION PELVIS;  Surgeon: Altamese Humphrey, MD;  Location: [facility];  Service: Orthopedics;;  . HEMATOMA EVACUATION Right 05/14/2016   Procedure: EVACUATION HEMATOMA;  Surgeon: Gaye Pollack, MD;  Location: [facility];  Service: Thoracic;  Laterality: Right;  Evacuation of Hematoma Right Chest  . LACERATION REPAIR  02/09/2016   Procedure: REPAIR LIVER LACERATION;  Surgeon: Judeth Horn, MD;  Location: [facility];  Service: General;;  . LAPAROTOMY N/A 02/10/2016   Procedure: EXPLORATORY LAPAROTOMY, removal of packs,  cauterization of liver, repacking of liver, and open  abdomen vac application;  Surgeon: Georganna Skeans, MD;  Location: [facility];  Service: General;  Laterality: N/A;  . LAPAROTOMY N/A 02/11/2016   Procedure: EXPLORATORY LAPAROTOMY VAC CHANGE ;  Surgeon: Judeth Horn, MD;  Location: [facility];  Service: General;  Laterality: N/A;  . LAPAROTOMY N/A 02/13/2016   Procedure: EXPLORATORY LAPAROTOMY, REMOVAL OF  PACKS, ABDOMINAL VAC DRESSING CHANGE;  Surgeon: Georganna Skeans, MD;  Location: [facility];  Service: General;  Laterality: N/A;  . LAPAROTOMY N/A 02/18/2016   Procedure: EXPLORATORY LAPAROTOMY, PLACEMENT OF ABRA ABDOMINAL WALL CLOSURE SET;  Surgeon: Judeth Horn, MD;  Location: [facility];  Service: General;  Laterality: N/A;  . LAPAROTOMY N/A 02/09/2016   Procedure: EXPLORATORY LAPAROTOMY;  Surgeon: Judeth Horn, MD;  Location: [facility];  Service: General;  Laterality: N/A;  . LAPAROTOMY N/A 02/16/2016   Procedure: EXPLORATORY LAPAROTOMY, ABDOMINAL [place];  Surgeon: Georganna Skeans, MD;  Location: [facility];  Service: General;  Laterality: N/A;  . PERCUTANEOUS TRACHEOSTOMY N/A 03/01/2016   Procedure: PERCUTANEOUS TRACHEOSTOMY;  Surgeon: Judeth Horn, MD;  Location: [facility];  Service: General;  Laterality: N/A;  Chauncey Mann PINNING Right 02/16/2016   Procedure: Dub Mikes;  Surgeon: Altamese Prentice, MD;  Location: [facility];  Service: Orthopedics;  Laterality: Right;  . TEE WITHOUT CARDIOVERSION N/A 05/14/2016   Procedure: TRANSESOPHAGEAL ECHOCARDIOGRAM (TEE);  Surgeon: Gaye Pollack, MD;  [facility];  Service: Thoracic;  Laterality: N/A;  . THORACOTOMY/LOBECTOMY Right 04/26/2016   Procedure: Right THORACOTOMY AND DRAINAGE OF EMPYEMA;  Surgeon: Gaye Pollack, MD;  Location: [facility];  Service: Thoracic;  Laterality: Right;  Marland Kitchen VIDEO ASSISTED THORACOSCOPY (VATS)/THOROCOTOMY Right 05/14/2016   Procedure: RIGHT VIDEO ASSISTED THORACOSCOPY (VATS),DRAINAGE OF EMPYEMA;  Surgeon: Gaye Pollack, MD;  Location: [place];  Service: Thoracic;  Laterality: Right;  . WOUND DEBRIDEMENT N/A 03/01/2016   Procedure: ABDOMINAL WOUND CLOSURE;  Surgeon: Judeth Horn, MD;  Location: [place];  Service: General;  Laterality: N/A;    Assessment & Plan Clinical Impression: Patient is a 35 y.o. year old female with recent admission to the hospital on 02/09/16 after suicide attempt. She drank drano, jumped out of 5th floor hotel room and  left DNR note. She sustained multiple injuries including right rib fractures with HPTX treated with chest tube, Pelvic fracture requiring SI pinning 4/10, multiple abdominal procedures including cholecystectomy, bowel resection and wound VAC and closure by secondary intention. Multiple left wrist fractures splinted and WBAT per Ddr. Caralyn Guile, tracheostomy for VDRF and PEG placement of nutritional support. She developed empyema requiring thoracotomy for drainage on 06/20 by Dr. Cyndia Bent. She tolerated extubation and decannulation without difficulty. Psychiatry following for input and medication management--for transfer to [facility] once chest tubes removed. 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 CT venogram without evidence of cortical or sagittal sinus thrombus. She developed new onset seizure on 07/05 most likely due to hemorrhagic transformation and was loaded with Vimpat. TEE 7/7 negative for vegetation or thrombus. She developed recurrent right hemothorax requiring right thoracotomy with drainage on 07/07.   Neurology following for input and repeat MRI brain 7/13 showed resolution of right frontal diffusion abnormality suggestive of late subacute to early chronic hemorrhage of unclear etiology. Dr. Leonie Man questioned delayed hemorrhagic contusion from fall v/s cryptogenic infarct of unknown cause and recommends ASA once bloody pleural effusion resolves  and H/H stable. CT removed on 07/17 and follow up CXR showed good resolution. Oral intake improving. She is WBAT BUE/BLE  Per Dr. Louretta Shorten "patient at chronically elevated risk for self harm; risk including previous suicide attempt, impulsivity, chronic pain, stroke, depression, history of trauma." He recommends continuing sitter and current  medication regimen. Patient meets criteria for inpatient psychiatric hospitalization and involuntary commitment paper have been completed.   Patient prefers to discharge home with sister and her sister has beenunhappy with current psychiatric recommendations. Both refuse IVC to central hospital as they feel that this would be detrimental and prefer rehabfollowed by discharge to home where sister will provide supervision. She continues to be limited by LLE weakness with foot drop and requires assistance with ADL tasks. Therapy ongoing and CIR recommended for follow up therapy.  Patient transferred to CIR on 06/03/2016 .   Pt presents with decreased activity tolerance, decreased functional mobility, decreased balance, decreased coordination, decreased attention, decreased safety awareness Limiting pt's independence with leisure/community pursuits.  Leisure History/Participation Premorbid leisure interest/current participation: Medical laboratory scientific officer - Building control surveyor - Doctor, hospital - Travel (Comment);Crafts - Painting;Sports - Exercise (Comment) Expression Interests: Music (Comment);Singing;Dance;Play instrument (Comment) Other Leisure Interests: Television;Movies;Reading;Computer Leisure Participation Style: With Family/Friends Awareness of Community Resources: Excellent Psychosocial / Spiritual Patient agreeable to Pet Therapy: No Does patient have pets?: No Social interaction - Mood/Behavior: Cooperative Academic librarian Appropriate for Education?: Yes Recreational Therapy Orientation Orientation -Reviewed with patient: Available activity resources Strengths/Weaknesses Patient Strengths/Abilities: Willingness to participate;Active premorbidly Patient weaknesses: Physical limitations TR Patient demonstrates impairments in the following area(s): Endurance;Motor;Pain;Safety  Plan Rec Therapy Plan Is patient appropriate for Therapeutic Recreation?: Yes Rehab Potential:  Good Treatment times per week: Min 1 time per week >20 minutes Estimated Length of Stay: 2 weeks TR Treatment/Interventions: Adaptive equipment instruction;1:1 session;Balance/vestibular training;Functional mobility training;Patient/family education;Therapeutic activities;Recreation/leisure participation;Therapeutic exercise;UE/LE Coordination activities Recommendations for other services: Neuropsych  Recommendations for other services: Neuropsych  Discharge Criteria: Patient will be discharged from TR if patient refuses treatment 3 consecutive times without medical reason.  If treatment goals not met, if there is a change in medical status, if patient makes no progress towards goals or if patient is discharged from hospital.  The above assessment, treatment plan, treatment alternatives and goals were discussed and mutually agreed upon: by patient  [place] 06/08/2016, 12:02 PM
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
## 2016-06-08 NOTE — Progress Notes (Signed)
Physical Therapy Session Note  Patient Details  Name: [name] Bell MRN: [id number] Date of Birth: 08/31/1981  Today's Date: 06/08/2016 PT Individual Time: 1050-1155, 1345-1430, and 1500-1530 PT Individual Time Calculation (min): 65 min, 45 min, and 30 min    Short Term Goals: Week 1:  PT Short Term Goal 1 (Week 1): Pt will perform transfers with overall min assist PT Short Term Goal 2 (Week 1): Pt will be able to gait x 150' with min assist PT Short Term Goal 3 (Week 1): Pt will be able to perform dynamic standing balance with overall min assist PT Short Term Goal 4 (Week 1): Assessment by orthotist will be completed if appropriate to assess need for AFO for LLE.  Skilled Therapeutic Interventions/Progress Updates:    Treatment 1: Patient sitting in bed with sister present for part of session. Patient donned socks and shoes in bed with setup assist and more than reasonable time. Recreational therapist present for skilled co-treat to address sit to and from stand transfers using RW with min A overall, gait training using RW x 75 ft with steady assist and verbal cues for increased LLE clearance/heel strike and upright posture with forward gaze, unsupported sitting tolerance, LUE NMR placing clothespins to target within BOS due to abdominal pain with reaching outside BOS with cues to manipulate object in L hand instead of passing to R hand to reposition, and wheelchair parts management with supervision and wheelchair propulsion using BUE x 100 ft with verbal cues for efficient propulsion and technique for turns in controlled environment. Patient requested to return to bed at end of session, left supine with needs in reach and sitter present.   Treatment 2: Patient in bed upon arrival, donned socks and shoes with cues for technique and more than reasonable time and assist for tying laces due to time constraints. Patient performed sit <> stand transfers using RW with cues for technique and min A, gait  training using RW x 30 ft with steady assist, wheelchair propulsion using BUE x 100 ft with supervision and verbal cues for technique and L attention, stair training up/down 2 (6") stairs using 2 rails, ascending forward and descending backward with step-to pattern. Seated in wheelchair, performed bimanual task of pipetree puzzles Fig. 7 and Fig. 1 for LUE NMR. Patient fatigued and requested to be transported back to room. Patient left sitting in wheelchair with call bell in reach and sitter present.    Treatment 3: Patient in wheelchair upon arrival, sister and future brother in law departing. Performed ambulatory transfers using RW from wheelchair to and from [facility] and from wheelchair back to bed with steady assist. Performed Kinetron at 0.30 cm/sec x 30 sec + 45 sec + 45 sec for BLE neuro re-ed, forced use, and activity tolerance. Patient required min A to transfer sit > supine and total A to reposition in bed despite patient using rails and bridging to assist with repositioning. Patient left semi reclined in bed with sitter present.   Therapy Documentation Precautions:  Precautions Precautions: Fall, Other (comment) Precaution Comments: left sided weakness; sucide precautions Other Brace/Splint: L PRAFO in bed Restrictions Weight Bearing Restrictions: No RUE Weight Bearing: Weight bearing as tolerated LUE Weight Bearing: Weight bearing as tolerated RLE Weight Bearing: Weight bearing as tolerated LLE Weight Bearing: Weight bearing as tolerated Pain: Pain Assessment Pain Assessment: Faces Faces Pain Scale: Hurts little more Pain Type: Acute pain Pain Location: Abdomen Pain Descriptors / Indicators: Aching Pain Onset: With Activity Pain Intervention(s): Repositioned;Rest  See Function Navigator for Current Functional Status.   Therapy/Group: Individual Therapy  Kerney Elbe 06/08/2016, 1:31 PM

## 2016-06-08 NOTE — Progress Notes (Signed)
Spoke with Dr. Riley Kill regarding Psychology follow up for patient. Dr. Riley Kill stated that he has spoken with Psychology and will be in today. Dr. Riley Kill gave this nurse order to discontinue sitter and suicide precautions at this time. Carmie End, RN and Delle Reining, [place] heard conversation with Dr. Riley Kill regarding discontinuation of suicide precautions and sitter.

## 2016-06-08 NOTE — Progress Notes (Signed)
Patient tolerating therapy well. No complaints of pain. RN reviewed in detail suicide precautions. Patient agreeable to precautions. Continue with plan of care.  [name] Bell

## 2016-06-08 NOTE — Progress Notes (Signed)
In discussion with Dr. Riley Kill again, realized Psychology will follow up tomorrow. Dr. Riley Kill stated to leave suicide precautions in place.

## 2016-06-08 NOTE — Progress Notes (Signed)
Patient upset/tearful this AM, questioning why she can not have all her belongings in the room, although she states she understands suicide policy.  This RN Psychologist, occupational) discussed in depth with patient her frustrations this hospital stay and made efforts to alleviate the frustrations as much as possible at this time. Will report discussion to day shift charge RN and day shift primary RN.

## 2016-06-08 NOTE — Progress Notes (Signed)
PHYSICAL MEDICINE & REHABILITATION     PROGRESS NOTE    Subjective/Complaint:  Upset earlier in the AM per RN note. Smiling and happy when I arrived. Pleased with how her therpay day went yesterday. Did some dancing on the Wii!  Does report some pain in the right foot--was caught/dragged slightly during transfer.  ROS: Pt denies fever, rash/itching, headache, blurred or double vision, nausea, vomiting, abdominal pain, diarrhea, chest pain, shortness of breath, palpitations, dysuria, dizziness,  , bleeding, acute anxiety, or depression   Objective: Vital Signs: Blood pressure 134/82, pulse 74, temperature 98.2 F (36.8 C), temperature source Oral, resp. rate 17, height 5\' 11"  (1.803 m), weight 79 kg (174 lb 3.2 oz), SpO2 100 %. No results found. No results for input(s): WBC, HGB, HCT, PLT in the last 72 hours. No results for input(s): NA, K, CL, GLUCOSE, BUN, CREATININE, CALCIUM in the last 72 hours.  Invalid input(s): CO CBG (last 3)  No results for input(s): GLUCAP in the last 72 hours.  Wt Readings from Last 3 Encounters:  06/07/16 79 kg (174 lb 3.2 oz)  06/03/16 79.5 kg (175 lb 3.2 oz)  05/20/16 81.6 kg (180 lb)    Physical Exam:  Constitutional: She is oriented to person, place, and time. She appears well-developedand well-nourished.  HENT:  Head: Normocephalicand atraumatic.  Mouth/Throat: Oropharynx is clear and moist.  Eyes: Conjunctivaeare normal. Pupils are equal, round, and reactive to light. Right eye exhibits no discharge. No scleral icterus.  Neck: Normal range of motion. Neck supple.  Cardiovascular: Regular rhythm. Tachycardiapresent.  Murmurheard. Respiratory: Effort normaland breath sounds normal. No stridor. No respiratory distress. She has no wheezes.  Right lateral chest incision has healed well. GI: Soft. Bowel sounds are normal. She exhibits no distension. There is no tenderness.  Midline incision healing well--superficial with beefy  red tissue--closing. Healed scars noted from retention sutures.  Musculoskeletal: She exhibits mild tenderness on left 4th MTP area. Mild edema.  Neurological: She is alertand oriented to person, place, and time.  Speech clear. Able to follow commands without difficulty. LLE with sustained clonus.  Motor strength: 5-/5 in the right deltoid, biceps, triceps, grip, 4/5  hip flexor, knee extensor, ankle dorsiflexor. 3+ to 4 left deltoid, biceps, triceps, grip 3+/5, left hip flexor, 0 at the knee extensor, 2  left hip extensor, 0/5 at the ankle dorsiflexor and plantar flexor. Sensation mildly reduced to light touch in the left versus right upper and lower limb No evidence of facial droop Skin: Skin is warmand dry. No rashnoted. No erythema.  Psychiatric: Her behavior is normal. Thought contentnormal.  Pleasant, cooperative     Assessment/Plan: 1. Left hemiparesis and functional/cognitive deficits secondary to right MCA infarct/TBI/polytrauma which require 3+ hours per day of interdisciplinary therapy in a comprehensive inpatient rehab setting. Physiatrist is providing close team supervision and 24 hour management of active medical problems listed below. Physiatrist and rehab team continue to assess barriers to discharge/monitor patient progress toward functional and medical goals.  Function:  Bathing Bathing position   Position: Wheelchair/chair at sink  Bathing parts Body parts bathed by patient: Right arm, Left arm, Chest, Abdomen, Right upper leg, Left upper leg, Back, Right lower leg, Left lower leg Body parts bathed by helper: Buttocks, Right lower leg, Left lower leg, Back  Bathing assist Assist Level: Supervision or verbal cues, Set up   Set up : To obtain items  Upper Body Dressing/Undressing Upper body dressing   What is the patient wearing?: Pull  over shirt/dress     Pull over shirt/dress - Perfomed by patient: Thread/unthread right sleeve, Thread/unthread left sleeve,  Put head through opening, Pull shirt over trunk Pull over shirt/dress - Perfomed by helper: Thread/unthread right sleeve, Thread/unthread left sleeve, Pull shirt over trunk        Upper body assist Assist Level: Set up   Set up : To obtain clothing/put away  Lower Body Dressing/Undressing Lower body dressing   What is the patient wearing?: Non-skid slipper socks, Pants Underwear - Performed by patient: Pull underwear up/down Underwear - Performed by helper: Thread/unthread left underwear leg, Thread/unthread right underwear leg Pants- Performed by patient: Thread/unthread right pants leg, Pull pants up/down Pants- Performed by helper: Thread/unthread left pants leg Non-skid slipper socks- Performed by patient: Don/doff right sock, Don/doff left sock Non-skid slipper socks- Performed by helper: Don/doff right sock, Don/doff left sock                  Lower body assist Assist for lower body dressing: Touching or steadying assistance (Pt > 75%)      Toileting Toileting Toileting activity did not occur: Refused Toileting steps completed by patient: Adjust clothing prior to toileting Toileting steps completed by helper: Performs perineal hygiene, Adjust clothing after toileting Toileting Assistive Devices: Other (comment) (stedy)  Toileting assist Assist level: Touching or steadying assistance (Pt.75%)   Transfers Chair/bed transfer   Chair/bed transfer method: Squat pivot Chair/bed transfer assist level: Touching or steadying assistance (Pt > 75%) Chair/bed transfer assistive device: Armrests Mechanical lift: Stedy   Locomotion Ambulation     Max distance: 75 Assist level: Touching or steadying assistance (Pt > 75%)   Wheelchair   Type: Manual Max wheelchair distance: 150 ft Assist Level: Supervision or verbal cues  Cognition Comprehension Comprehension assist level: Follows complex conversation/direction with no assist  Expression Expression assist level: Expresses  complex ideas: With no assist  Social Interaction Social Interaction assist level: Interacts appropriately with others with medication or extra time (anti-anxiety, antidepressant).  Problem Solving Problem solving assist level: Solves complex problems: Recognizes & self-corrects  Memory Memory assist level: Complete Independence: No helper   Medical Problem List and Plan: 1. Left  hemiparesis and cognitive deficits secondary to right MCA infarct and traumatic brain injury  -continue therapies--  -team conference today 2. DVT Prophylaxis/Anticoagulation:   Lovenox indicated  -dopplers negative 3. Pain Management: tylenol prn  -?left 4th MT contusion?--observe only for now  -ice prn 4. Mood: Patient denies any anxiety, depressive symptoms, insomnia, nightmares or intent for self harm.   -psychiatry recommending inpatient commitment to psych hospital. Pt/sister do not want this   -need to come to decision on appropriate aftercare  -this patient does not appear to be a flight risk at this time  -neuropsychology report pending 5. Neuropsych: This patient is not fullycapable of making decisions on herown behalf. 6. Skin/Wound Care: Monitor wound for healing.  7. Fluids/Electrolytes/Nutrition: Monitor by mouth intake, follow-up on bmet 8. Abdominal procedures with bowel resection: Po intake has been good.   -Continue bid dressing change to midline incision. 9. TBI/R-MCA infarct with DAI with hemorrhage: ASA added 10 New onset seizures: Controlled on vimpat bid. 11. Empyema RLL s/p VATS: Resolved. Respiratory status stable.  12. Constipation: improved with  Metamucil and increase of senna-s to 2 tab bid .  -encouraged fruits/vegetables/water 13 Depression: Mood appears stable at present  -on Zoloft daily and on Klonopin at bedtime.  14. ABLA: on iron supplement.    LOS (  Days) 5 A FACE TO FACE EVALUATION WAS PERFORMED  SWARTZ,ZACHARY T 06/08/2016 8:15 AM

## 2016-06-08 NOTE — Progress Notes (Signed)
Occupational Therapy Session Note  Patient Details  Name: [name] Bell MRN: [id number] Date of Birth: July 13, 1981  Today's Date: 06/08/2016 OT Individual Time: 5797-2820 OT Individual Time Calculation (min): 75 min     Short Term Goals: Week 1:  OT Short Term Goal 1 (Week 1): Pt will transfer to toilet/ BSC with minA with LRAD OT Short Term Goal 2 (Week 1): Pt will don LB clothing sit to stand with min A with AE prn  OT Short Term Goal 3 (Week 1): Pt will perform 2 grooming tasks in standing at sink wiht min A OT Short Term Goal 4 (Week 1): Pt will demonstrate emergent awareness in ADL with min questioning cues  Skilled Therapeutic Interventions/Progress Updates:   self care retraining at sink level (pt's choice- declined shower). Pt reported no pain (see below).  Pt able to perform sit to stands with more than reasonable amt of time but with supervision. Pt able to bathe lower LEs by crossing legs without assistance- using a towel to assist with lifting left LE. Pt with inconsistencies in hip, knee and foot mm activation throughout session but with given extra time pt able to perform all tasks. Pt able to ambulation around room with RW (in and out of bathroom with supervision with RW). Pt declined paper clothing today. Pt left sitting EOB with sitter present  Therapy Documentation Precautions:  Precautions Precautions: Fall, Other (comment) Precaution Comments: left sided weakness; sucide precautions Other Brace/Splint: L PRAFO in bed Restrictions Weight Bearing Restrictions: No RUE Weight Bearing: Weight bearing as tolerated LUE Weight Bearing: Weight bearing as tolerated RLE Weight Bearing: Weight bearing as tolerated LLE Weight Bearing: Weight bearing as tolerated Pain:  no c/o pain .  Pt reported in depth she thinks her 4th toe on her left is broken and reported MD said to add ice to her foot after therapy.  However pt declined ice at end of session and did not report any pain in  session. ADL: ADL ADL Comments: see functional navigator Exercises:   Other Treatments:    See Function Navigator for Current Functional Status.   Therapy/Group: Individual Therapy  Roney Mans [facility] 06/08/2016, 11:50 AM

## 2016-06-08 NOTE — Progress Notes (Signed)
Physical Therapy Session Note  Patient Details  Name: [name] Bell MRN: [id number] Date of Birth: January 08, 1981  Today's Date: 06/08/2016 PT Individual Time: 1123-1210 PT Individual Time Calculation (min): 47 min    Short Term Goals: Week 1:  PT Short Term Goal 1 (Week 1): Pt will perform transfers with overall min assist PT Short Term Goal 2 (Week 1): Pt will be able to gait x 150' with min assist PT Short Term Goal 3 (Week 1): Pt will be able to perform dynamic standing balance with overall min assist PT Short Term Goal 4 (Week 1): Assessment by orthotist will be completed if appropriate to assess need for AFO for LLE.  Skilled Therapeutic Interventions/Progress Updates:   Pt received in bed, no reports of pain in supine. Pt reporting she would like to continue to work on LUE strength. Performed rolling to L side with min-mod A and sit > supine with mod A and verbal cues to sequence.  After donning shoes pt performed sit > stand from bed with mod A and min A to stand pivot to w/c.  In gym continued sit <> stand training with focus on use of upright gaze, head and trunk during forward lean to weight shift COG over BOS during sit > stand to minimize lifting assistance required.  Performed coordination, balance, core and UE strengthening and weight shift training in standing with alternating forward/retro stepping with UE supported on walking sticks for increased core and extensor activation x 5-10 reps with mod A for balance and sequencing.  Due to fatigue transitioned to core/UE strength training seated on mat without LE support while performing alternating and bilat UE dynamic movements with orange theraband with resistance and exercises modified due to abdominal pain.  Pt performed transfer back to w/c and to bed stand pivot with min-mod A and performed sit > supine with mod A.  Pt left with sitter present and all items within reach.  Therapy Documentation Precautions:  Precautions Precautions:  Fall, Other (comment) Precaution Comments: left sided weakness; sucide precautions Other Brace/Splint: L PRAFO in bed Restrictions Weight Bearing Restrictions: No RUE Weight Bearing: Weight bearing as tolerated LUE Weight Bearing: Weight bearing as tolerated RLE Weight Bearing: Weight bearing as tolerated LLE Weight Bearing: Weight bearing as tolerated Pain:  Reports abdominal pain and soreness with exercises.     See Function Navigator for Current Functional Status.   Therapy/Group: Individual Therapy  Edman Circle [facility] 06/08/2016, 12:55 PM

## 2016-06-08 NOTE — Progress Notes (Signed)
Nutrition Brief Note  RD consulted for ordering nourishment snacks via RN. Orders have been put in per pt preferences. Nursing staff to provide the ordered snack items between meals. Patient meal completion has been mostly 100%. No further nutrition interventions warranted at this time. If nutritional issues arise, please consult RD.  Roslyn Smiling, MS, RD, LDN Pager # [phone] After hours/ weekend pager # [phone]

## 2016-06-09 ENCOUNTER — Inpatient Hospital Stay (HOSPITAL_COMMUNITY): Payer: Medicaid Other | Admitting: Occupational Therapy

## 2016-06-09 ENCOUNTER — Inpatient Hospital Stay (HOSPITAL_COMMUNITY): Payer: Self-pay | Admitting: Occupational Therapy

## 2016-06-09 ENCOUNTER — Inpatient Hospital Stay (HOSPITAL_COMMUNITY): Payer: Medicaid Other | Admitting: Physical Therapy

## 2016-06-09 DIAGNOSIS — Z9889 Other specified postprocedural states: Secondary | ICD-10-CM

## 2016-06-09 DIAGNOSIS — T1491 Suicide attempt: Secondary | ICD-10-CM

## 2016-06-09 NOTE — Progress Notes (Signed)
Occupational Therapy Session Note  Patient Details  Name: [name] Bell MRN: [id number] Date of Birth: [birth year]  Today's Date: 06/09/2016 OT Individual Time: 1502-1600 OT Individual Time Calculation (min): 58 min     Short Term Goals: Week 1:  OT Short Term Goal 1 (Week 1): Pt will transfer to toilet/ BSC with minA with LRAD OT Short Term Goal 2 (Week 1): Pt will don LB clothing sit to stand with min A with AE prn  OT Short Term Goal 3 (Week 1): Pt will perform 2 grooming tasks in standing at sink wiht min A OT Short Term Goal 4 (Week 1): Pt will demonstrate emergent awareness in ADL with min questioning cues  Skilled Therapeutic Interventions/Progress Updates:    Upon entering the room, pt supine in bed with RN changing dressing. Pt performed sit <>stand from bed with supervision. Pt ambulating 150' with close supervision before needing to sit and rest. OT propelled pt the rest of the way to ADL apartment. Pt practicing functional transfers on/off standard bed and soft love seat. Pt able to stand from bed with close supervision. Pt needing multiple trials to stand from sofa with mod lifting assistance. Pt returning to wheelchair and OT propelling pt to room secondary to fatigue. Pt returning to bed with mod A for sit >supine. Call bell and all needed items within reach and sitter within room.    Therapy Documentation Precautions:  Precautions Precautions: Fall, Other (comment) Precaution Comments: left sided weakness; sucide precautions Other Brace/Splint: L PRAFO in bed Restrictions Weight Bearing Restrictions: No RUE Weight Bearing: Weight bearing as tolerated LUE Weight Bearing: Weight bearing as tolerated RLE Weight Bearing: Weight bearing as tolerated LLE Weight Bearing: Weight bearing as tolerated General: General PT Missed Treatment Reason: Unavailable (Comment) (psychologist appt) Vital Signs: Therapy Vitals Temp: 98.4 F (36.9 C) Temp Source: Oral Pulse Rate:  82 Resp: 14 BP: 126/86 Patient Position (if appropriate): Sitting Oxygen Therapy SpO2: 100 % O2 Device: Not Delivered Pain:   ADL: ADL ADL Comments: see functional navigator Exercises:   Other Treatments:    See Function Navigator for Current Functional Status.   Therapy/Group: Individual Therapy  Lowella Grip 06/09/2016, 4:26 PM

## 2016-06-09 NOTE — Progress Notes (Signed)
Physical Therapy Session Note  Patient Details  Name: [name] Bell MRN: [id number] Date of Birth: [birth year]  Today's Date: 06/09/2016 PT Individual Time: 2947-6546 and 1345-1410 PT Individual Time Calculation (min): 60 min and 25 min    Short Term Goals: Week 1:  PT Short Term Goal 1 (Week 1): Pt will perform transfers with overall min assist PT Short Term Goal 2 (Week 1): Pt will be able to gait x 150' with min assist PT Short Term Goal 3 (Week 1): Pt will be able to perform dynamic standing balance with overall min assist PT Short Term Goal 4 (Week 1): Assessment by orthotist will be completed if appropriate to assess need for AFO for LLE.  Skilled Therapeutic Interventions/Progress Updates:    Treatment 1: Patient in recliner with sitter and sister present initially, donned shoes with total A. Patient performed sit > stand from recliner with max A using RW. Gait training using RW x 170 ft + 50 ft with supervision and verbal cues for upright posture and hip ext to neutral due to forward lean. Sit <> stand and ambulatory transfers using RW from standard arm chair, wheelchair, and NuStep with supervision. Performed NuStep using BUE/BLE at level 1 > 2 x 8 min with focus on forced use and neutral alignment LLE due to hip abductor weakness. Performed ambulatory transfer to simulated car at sedan height using RW with supervision and verbal cues for technique and safety with patient using BUE to lift LLE in/out of car. Gait training using RW up/down ramp and across woodchip surface with supervision and up/down curb using RW with min A and cues for sequencing and technique. Patient requested to return to bed at end of session. Instructed in sit > supine with min A to lift LLE onto bed. Patient left semi reclined in bed with sitter present.   Treatment 2: Patient fatigued following previous OT session and requested to remain seated for therapy. Patient engaged in tabletop task with focus on  manipulating small objects with LUE with patient reporting increased L shoulder pain. Patient's psychiatrist present and requested to see patient at this time, therefore patient missed 20 min skilled PT.  Therapy Documentation  Precautions:  Precautions Precautions: Fall, Other (comment) Precaution Comments: left sided weakness; sucide precautions Other Brace/Splint: L PRAFO in bed Restrictions Weight Bearing Restrictions: Yes RUE Weight Bearing: Weight bearing as tolerated LUE Weight Bearing: Weight bearing as tolerated RLE Weight Bearing: Weight bearing as tolerated LLE Weight Bearing: Weight bearing as tolerated General: PT Amount of Missed Time (min): 20 Minutes PT Missed Treatment Reason: Unavailable (Comment) (psychologist appt) Pain: Pain Assessment Pain Assessment: No/denies pain   See Function Navigator for Current Functional Status.   Therapy/Group: Individual Therapy  Kerney Elbe 06/09/2016, 10:59 AM

## 2016-06-09 NOTE — Progress Notes (Signed)
Social Work Patient ID: [name] Bell, female   DOB: [birth year], 35 y.o.   MRN: [id number]   Met yesterday afternoon with pt and spoke with sister this morning to review team conference.  Pt aware and agreeable with targeted d/c date of 8/10.  They understanding that Dr. Lenna Sciara to re-eval and discussion continues about d/c plans.    Haylea Schlichting, LCSW

## 2016-06-09 NOTE — Progress Notes (Signed)
Occupational Therapy Session Note  Patient Details  Name: [name] Bell MRN: [id number] Date of Birth: May 09, 1981  Today's Date: 06/09/2016 OT Individual Time: 1300-1345 OT Individual Time Calculation (min): 45 min   Short Term Goals: Week 1:  OT Short Term Goal 1 (Week 1): Pt will transfer to toilet/ BSC with minA with LRAD OT Short Term Goal 2 (Week 1): Pt will don LB clothing sit to stand with min A with AE prn  OT Short Term Goal 3 (Week 1): Pt will perform 2 grooming tasks in standing at sink wiht min A OT Short Term Goal 4 (Week 1): Pt will demonstrate emergent awareness in ADL with min questioning cues  Skilled Therapeutic Interventions/Progress Updates:    Pt seen for skilled OT session focusing on activity tolerance and dynamic standing balance. Pt sitting in recliner and agreeable to tx session upon arrival. During session pt initially needed mod A sit to stand but progressed to min A with increased rest breaks. Pt donned pants with CGA while standing to pull them up. Pt then ambulated approximately 25 feet to ABI gym before needing rest break. OT propelled pt the rest of the way for energy conservation.  Pt utilized dynavision to increase dynamic standing balance alternating using right and left hand  with close supervision (pt required multiple rest breaks throughout sessions). Pt became tearful during session due to being anxious about future d/c disposition; pt desires to return home with sister. OT provided therapeutic listening and encouraged pt to focus on rehab goals and pt was able to continue with tx session.  Pt ambulated to room with RW and close supervision and left sitting in recliner with PT present. Throughout session OT provided therapeutic listening, educated on energy conservation and breathing strategies.   Therapy Documentation Precautions:  Precautions Precautions: Fall, Other (comment) Precaution Comments: left sided weakness; sucide precautions Other  Brace/Splint: L PRAFO in bed Restrictions Weight Bearing Restrictions: Yes RUE Weight Bearing: Weight bearing as tolerated LUE Weight Bearing: Weight bearing as tolerated RLE Weight Bearing: Weight bearing as tolerated LLE Weight Bearing: Weight bearing as tolerated Pain: Pain Assessment Pain Assessment: No/denies pain  See Function Navigator for Current Functional Status.   Therapy/Group: Individual Therapy  Malachi Bonds 06/09/2016, 2:16 PM

## 2016-06-09 NOTE — Progress Notes (Signed)
Pt voiced concerns regarding staff the past several days saying her care has not been good. She further explained that certain staff give her some rules that other staff do not regarding her suicide precautions. RN educated patient on suicide precaution patients and [place] policy. Day shift RN made a guideline to meet her needs while maintaining policy. RN discussed at length with patient her specific guidelines. Pt has a copy at her bedside. She said the only issue she has is her wheelchair. She does not want her wheelchair left in the hallway where everyone can access it. RN said it was put away with the other storage items and will not be outside of her room. She said her sister took her phone charger home so that was no longer a problem as well. After 45 minutes of educating and listening to patient, she agrees to those guidelines and is very happy she has something in writing. Will continue to monitor. Royston Cowper, RN

## 2016-06-09 NOTE — Plan of Care (Signed)
Problem: RH BLADDER ELIMINATION Goal: RH STG MANAGE BLADDER WITH ASSISTANCE STG Manage Bladder With  Min Assistance   Outcome: Not Progressing incont at night due to patient refusing to get up to the bathroom and refusing bedpan   Problem: RH SKIN INTEGRITY Goal: RH STG ABLE TO PERFORM INCISION/WOUND CARE W/ASSISTANCE STG Able To Perform Incision/Wound Care With min Assistance.   Outcome: Not Progressing With total assist

## 2016-06-09 NOTE — Care Management Note (Signed)
[facility] Individual Statement of Services  Patient Name:  [name] Bell  Date:  06/07/2016  Welcome [facility] the [facility].  Our goal is to provide you with an individualized program based on your diagnosis and situation, designed to meet your specific needs.  With this comprehensive rehabilitation program, you will be expected to participate in at least 3 hours of rehabilitation therapies Monday-Friday, with modified therapy programming on the weekends.  Your rehabilitation program will include the following services:  Physical Therapy (PT), Occupational Therapy (OT), Speech Therapy (ST), 24 hour per day rehabilitation nursing, Therapeutic Recreaction (TR), Neuropsychology, Case Management (Social Worker), Rehabilitation Medicine, Nutrition Services and Pharmacy Services  Weekly team conferences will be held on Tuesdays to discuss your progress.  Your Social Worker will talk with you frequently to get your input and to update you on team discussions.  Team conferences with you and your family in attendance may also be held.  Expected length of stay: 2 weeks  Overall anticipated outcome: supervision  Depending on your progress and recovery, your program may change. Your Social Worker will coordinate services and will keep you informed of any changes. Your Social Worker's name and contact numbers are listed  below.  The following services may also be recommended but are not provided by the [facility]:   Driving Evaluations  Home Health Rehabiltiation Services  Outpatient Rehabilitation Services  Vocational Rehabilitation   Arrangements will be made to provide these services after discharge if needed.  Arrangements include referral to agencies that provide these services.  Your insurance has been verified to be:  Medicaid Your primary doctor is:  None  Pertinent information will be shared with your doctor and your insurance  company.  Social Worker:  [place], [place] [phone] or [phone]   Information discussed with and copy given to patient by: Amada Jupiter, 06/07/2016, 10:54 AM

## 2016-06-09 NOTE — Progress Notes (Signed)
[facility] PHYSICAL MEDICINE & REHABILITATION     PROGRESS NOTE    Subjective/Complaint:  In bed waiting for breakfast. Reports some pain and spasms in thighs at night which affected her sleep somewhat. Left foot was better yesterday in therapy.   ROS: Pt denies fever, rash/itching, headache, blurred or double vision, nausea, vomiting, abdominal pain, diarrhea, chest pain, shortness of breath, palpitations, dysuria, dizziness,  , bleeding, acute anxiety, or depression   Objective: Vital Signs: Blood pressure 131/82, pulse 66, temperature 98.4 F (36.9 C), temperature source Oral, resp. rate 18, height 5\' 11"  (1.803 m), weight 79.9 kg (176 lb 1.6 oz), SpO2 100 %. No results found. No results for input(s): WBC, HGB, HCT, PLT in the last 72 hours. No results for input(s): NA, K, CL, GLUCOSE, BUN, CREATININE, CALCIUM in the last 72 hours.  Invalid input(s): CO CBG (last 3)  No results for input(s): GLUCAP in the last 72 hours.  Wt Readings from Last 3 Encounters:  06/09/16 79.9 kg (176 lb 1.6 oz)  06/03/16 79.5 kg (175 lb 3.2 oz)  05/20/16 81.6 kg (180 lb)    Physical Exam:  Constitutional: She is oriented to person, place, and time. She appears well-developedand well-nourished.  HENT:  Head: Normocephalicand atraumatic.  Mouth/Throat: Oropharynx is clear and moist.  Eyes: Conjunctivaeare normal. Pupils are equal, round, and reactive to light. Right eye exhibits no discharge. No scleral icterus.  Neck: Normal range of motion. Neck supple.  Cardiovascular: Regular rhythm. Tachycardiapresent.  Murmurheard. Respiratory: Effort normaland breath sounds normal. No stridor. No respiratory distress. She has no wheezes.  Right lateral chest incision has healed well. GI: Soft. Bowel sounds are normal. She exhibits no distension. There is no tenderness.  Midline incision healing with granulation tissue. Musculoskeletal: left foot minimally tender. Trace pedal edema. Neurological:  She is alertand oriented to person, place, and time.  Speech clear. Able to follow commands without difficulty. LLE with sustained clonus.  Motor strength: 5-/5 in the right deltoid, biceps, triceps, grip, 4/5  hip flexor, knee extensor, ankle dorsiflexor. 3+ to 4 left deltoid, biceps, triceps, grip 3+/5, left hip flexor, 0 at the knee extensor, 2  left hip extensor, 0/5 at the ankle dorsiflexor and plantar flexor. Sensation mildly reduced to light touch in the left versus right upper and lower limb No facial droop Skin: Skin is warmand dry. No rashnoted. No erythema.  Psychiatric: Her behavior is normal. Thought contentnormal.  Pleasant, cooperative with me once again    Assessment/Plan: 1. Left hemiparesis and functional/cognitive deficits secondary to right MCA infarct/TBI/polytrauma which require 3+ hours per day of interdisciplinary therapy in a comprehensive inpatient rehab setting. Physiatrist is providing close team supervision and 24 hour management of active medical problems listed below. Physiatrist and rehab team continue to assess barriers to discharge/monitor patient progress toward functional and medical goals.  Function:  Bathing Bathing position   Position: Wheelchair/chair at sink  Bathing parts Body parts bathed by patient: Right arm, Left arm, Chest, Abdomen, Right upper leg, Left upper leg, Back, Right lower leg, Left lower leg Body parts bathed by helper: Buttocks, Right lower leg, Left lower leg, Back  Bathing assist Assist Level: Supervision or verbal cues, Set up   Set up : To obtain items  Upper Body Dressing/Undressing Upper body dressing   What is the patient wearing?: Pull over shirt/dress     Pull over shirt/dress - Perfomed by patient: Thread/unthread right sleeve, Thread/unthread left sleeve, Put head through opening, Pull shirt over trunk  Pull over shirt/dress - Perfomed by helper: Thread/unthread right sleeve, Thread/unthread left sleeve, Pull  shirt over trunk        Upper body assist Assist Level: Set up   Set up : To obtain clothing/put away  Lower Body Dressing/Undressing Lower body dressing   What is the patient wearing?: Non-skid slipper socks, Pants Underwear - Performed by patient: Pull underwear up/down Underwear - Performed by helper: Thread/unthread left underwear leg, Thread/unthread right underwear leg Pants- Performed by patient: Thread/unthread right pants leg, Pull pants up/down Pants- Performed by helper: Thread/unthread left pants leg Non-skid slipper socks- Performed by patient: Don/doff right sock, Don/doff left sock Non-skid slipper socks- Performed by helper: Don/doff right sock, Don/doff left sock                  Lower body assist Assist for lower body dressing: Touching or steadying assistance (Pt > 75%)      Toileting Toileting Toileting activity did not occur: Refused Toileting steps completed by patient: Adjust clothing prior to toileting Toileting steps completed by helper: Performs perineal hygiene, Adjust clothing after toileting Toileting Assistive Devices: Other (comment) (stedy)  Toileting assist Assist level: Touching or steadying assistance (Pt.75%)   Transfers Chair/bed transfer   Chair/bed transfer method: Ambulatory Chair/bed transfer assist level: Touching or steadying assistance (Pt > 75%) Chair/bed transfer assistive device: Walker, Armrests Mechanical lift: Landscape architect     Max distance: 75 Assist level: Touching or steadying assistance (Pt > 75%)   Wheelchair   Type: Manual Max wheelchair distance: 130 ft Assist Level: Supervision or verbal cues  Cognition Comprehension Comprehension assist level: Follows complex conversation/direction with no assist  Expression Expression assist level: Expresses complex ideas: With no assist  Social Interaction Social Interaction assist level: Interacts appropriately with others with medication or extra time  (anti-anxiety, antidepressant).  Problem Solving Problem solving assist level: Solves complex problems: Recognizes & self-corrects  Memory Memory assist level: Complete Independence: No helper   Medical Problem List and Plan: 1. 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 DVT Prophylaxis/Anticoagulation:   Lovenox indicated  -dopplers negative 3. Pain Management: tylenol prn  -?left 4th MT contusion---improving 4. Mood: Patient denies any anxiety, depressive symptoms, insomnia, nightmares or intent for self harm.   -psychiatry recommending inpatient commitment to psych hospital.   -appreciate neuropysch input  -I have asked psychiatry to follow up with patient for assessment of her current status and recs  -sitter continues 5. Neuropsych: This patient is not fullycapable of making decisions on herown behalf. 6. Skin/Wound Care: Monitor wound for healing.  7. Fluids/Electrolytes/Nutrition: Monitor by mouth intake, follow-up on bmet 8. Abdominal procedures with bowel resection: Po intake has been good.   -Continue bid dressing change to midline incision. 9. TBI/R-MCA infarct with DAI with hemorrhage: ASA added 10 New onset seizures: Controlled on vimpat bid. 11. Empyema RLL s/p VATS: Resolved. Respiratory status stable.  12. Constipation: improved with  Metamucil and increase of senna-s to 2 tab bid .  -encouraged fruits/vegetables/water 13 Depression: Mood appears stable at present  -on Zoloft daily and on Klonopin at bedtime.  14. ABLA: on iron supplement.    LOS (Days) 6 A FACE TO FACE EVALUATION WAS PERFORMED  Jerusalem Brownstein T 06/09/2016 9:01 AM

## 2016-06-09 NOTE — Patient Care Conference (Signed)
Inpatient RehabilitationTeam Conference and Plan of Care Update Date: 06/08/2016   Time: 2:45 PM    Patient Name: [name] Bell      Medical Record Number: [id number]  Date of Birth: 03-13-1981 Sex: Female         Room/Bed: 4W11C/4W11C-01 Payor Info: Payor: MEDICAID [place] / Plan: MEDICAID OF [place] / Product Type: *No Product type* /    Admitting Diagnosis: Suicide Attempt Poly Trauma  Admit Date/Time:  06/03/2016  5:22 PM Admission Comments: No comment available   Primary Diagnosis:  Traumatic brain injury with loss of consciousness of 1 hour to 5 hours 59 minutes (HCC) Principal Problem: Traumatic brain injury with loss of consciousness of 1 hour to 5 hours 59 minutes [facility])  Patient Active Problem List   Diagnosis Date Noted  . 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 Left hemiparesis (HCC)   . S/P thoracotomy 05/14/2016  . Cerebral embolism with cerebral infarction 05/11/2016  . 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 Multiple pelvic fractures (HCC) 03/03/2016  . Left wrist fracture 03/03/2016  . Multiple fractures of ribs of right side 03/03/2016  . Fracture of multiple ribs of left side 03/03/2016  . Liver laceration 03/03/2016  . Small intestine injury 03/03/2016  . Acute blood loss anemia 03/03/2016  . Suicide attempt (HCC) 03/03/2016  . Bilateral pulmonary contusion 03/03/2016  . Acute respiratory failure (HCC) 03/03/2016  . Hypokalemia 03/03/2016  . Hypernatremia 03/03/2016  . Acute kidney injury (HCC) 03/03/2016  . Ingestion of caustic substance 03/03/2016  . Tylenol overdose 03/03/2016  . Hyperglycemia 03/03/2016  . Hepatic failure (HCC) 03/03/2016  . Pressure ulcer 02/26/2016  . Fall from, out of or through building, not otherwise specified, initial encounter 02/09/2016  . Hypovolemic shock (HCC) 02/09/2016    Expected Discharge Date: Expected Discharge  Date: 06/17/16  Team Members Present: Physician leading conference: Dr. Faith Rogue Social Worker Present: Amada Jupiter, LCSW Nurse Present: Ronny Bacon, RN PT Present: Karolee Stamps, Silva Bandy, PT OT Present: Ardis Rowan, COTA;Johnsie Cancel, OT SLP Present: Fae Pippin, SLP PPS Coordinator present : Edson Snowball, PT     Current Status/Progress Goal Weekly Team Focus  Medical   TBI/CVA after suicide attempt. behavior/psych issues have been stable thus far  improve functional mobility  pain control, bowel mgt, assessment of behavior   Bowel/Bladder   incontinent of bowel and bladder by choice at night, continent of bowel and bladder during day; LBM7/30  Continent of B/B  Continent of B/B at night   Swallow/Nutrition/ Hydration             ADL's   Setup/Supervision UB dressing /grooming; Min-mod A LB dressing/grooming; using RW to toilet with min A  Supervision/setup  Activity tolerance, functional mobility and ADL-Retraining   Mobility   one assist with stedy  supervision overall; min assist stairs  endurance, sit <> stands, neuro re-ed for LLE/LUE, balance re-training, bed mobility, and gait   Communication             Safety/Cognition/ Behavioral Observations  Currently on suicidal ideation monitoring. Denies any plan to hurt self or others  Free from injury      Pain   Denies pain, 5-10mg  Oxy IR q 4hr PRN  <3  Assess pain q4hr and prn   Skin   R hip sutures, midline incision-wet to dry drsg change BID  No skin breakdown  assess skin q shift    Rehab Goals Patient on target to meet rehab goals: Yes *See Care Plan and progress notes for long and short-term goals.  Barriers to Discharge: obvious psychiatric issues/need for appropriate follow up and supervision at discharge    Possible Resolutions to Barriers:  engagement of psychology and psychiatry to help determine plan    Discharge Planning/Teaching Needs:  Exact d/c location still under consideration:   either to inpatient psychiatric facility or home with sister (who can provide 24/7 supervision)      Team Discussion:  Making good gains and anticipate reaching supervision goals.  Discussion about psychiatric issues and d/c planning issues.  MD to ask Dr. Juluis Rainier to see pt again to determine where pt will d/c.    Revisions to Treatment Plan:  None   Continued Need for Acute Rehabilitation Level of Care: The patient requires daily medical management by a physician with specialized training in physical medicine and rehabilitation for the following conditions: Daily direction of a multidisciplinary physical rehabilitation program to ensure safe treatment while eliciting the highest outcome that is of practical value to the patient.: Yes Daily medical management of patient stability for increased activity during participation in an intensive rehabilitation regime.: Yes Daily analysis of laboratory values and/or radiology reports with any subsequent need for medication adjustment of medical intervention for : Neurological problems;Mood/behavior problems;Post surgical problems  Eddith Mentor 06/10/2016, 4:44 PM

## 2016-06-10 ENCOUNTER — Inpatient Hospital Stay (HOSPITAL_COMMUNITY): Payer: Self-pay | Admitting: Occupational Therapy

## 2016-06-10 ENCOUNTER — Inpatient Hospital Stay (HOSPITAL_COMMUNITY): Payer: Medicaid Other | Admitting: Occupational Therapy

## 2016-06-10 ENCOUNTER — Inpatient Hospital Stay (HOSPITAL_COMMUNITY): Payer: Self-pay | Admitting: Physical Therapy

## 2016-06-10 DIAGNOSIS — T543X2A Toxic effect of corrosive alkalis and alkali-like substances, intentional self-harm, initial encounter: Secondary | ICD-10-CM

## 2016-06-10 DIAGNOSIS — T391X2A Poisoning by 4-Aminophenol derivatives, intentional self-harm, initial encounter: Secondary | ICD-10-CM

## 2016-06-10 DIAGNOSIS — F4312 Post-traumatic stress disorder, chronic: Secondary | ICD-10-CM

## 2016-06-10 DIAGNOSIS — T450X2A Poisoning by antiallergic and antiemetic drugs, intentional self-harm, initial encounter: Secondary | ICD-10-CM

## 2016-06-10 DIAGNOSIS — X80XXXA Intentional self-harm by jumping from a high place, initial encounter: Secondary | ICD-10-CM

## 2016-06-10 LAB — CREATININE, SERUM: Creatinine, Ser: 0.73 mg/dL (ref 0.44–1.00)

## 2016-06-10 MED ORDER — MUSCLE RUB 10-15 % EX CREA
TOPICAL_CREAM | Freq: Three times a day (TID) | CUTANEOUS | Status: DC
  Administered 2016-06-10: 17:00:00 via TOPICAL
  Filled 2016-06-10: qty 85

## 2016-06-10 NOTE — Progress Notes (Signed)
Occupational Therapy Session Note  Patient Details  Name: [name] Bell MRN: [id number] Date of Birth: 07-16-1981  Today's Date: 06/10/2016 OT Individual Time: 2233-6122 OT Individual Time Calculation (min): 56 min     Short Term Goals: Week 1:  OT Short Term Goal 1 (Week 1): Pt will transfer to toilet/ BSC with minA with LRAD OT Short Term Goal 2 (Week 1): Pt will don LB clothing sit to stand with min A with AE prn  OT Short Term Goal 3 (Week 1): Pt will perform 2 grooming tasks in standing at sink wiht min A OT Short Term Goal 4 (Week 1): Pt will demonstrate emergent awareness in ADL with min questioning cues  Skilled Therapeutic Interventions/Progress Updates:    Upon entering the room, pt seated in wheelchair awaiting therapist with c/o stomach ache. Pt agreeing to OT intervention and stating, "It always hurts." Pt declined shower this session but agreeable to washing at sink with sit <>stand from wheelchair. Pt performing sit <>stand with close supervision. Pt standing to wash buttocks and peri area with steady assistance for balance. Pt utilizing long handled reacher to increase I with LB bathing. Encouragement to cross ankle over to opposite knee in order to don/doff socks independently this session. Pt needing multiple rest breaks secondary to fatigue this session. Pt became tearful during session and stating, "I have been here for 4 months." Pt redirected for task with min cues. Pt transferring from wheelchair >bed with stand pivot transfer and use of RW and steady assistance. Pt returning to supine and sitter present in room.Call bell and all needed items within reach.   Therapy Documentation Precautions:  Precautions Precautions: Fall, Other (comment) Precaution Comments: left sided weakness; sucide precautions Other Brace/Splint: L PRAFO in bed Restrictions Weight Bearing Restrictions: Yes RUE Weight Bearing: Weight bearing as tolerated LUE Weight Bearing: Weight bearing as  tolerated RLE Weight Bearing: Weight bearing as tolerated LLE Weight Bearing: Weight bearing as tolerated    ADL: ADL ADL Comments: see functional navigator  See Function Navigator for Current Functional Status.   Therapy/Group: Individual Therapy  Lowella Grip 06/10/2016, 12:50 PM
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
## 2016-06-10 NOTE — Progress Notes (Signed)
Physical Therapy Session Note  Patient Details  Name: [name] Bell MRN: [id number] Date of Birth: [birth year]  Today's Date: 06/10/2016 PT Individual Time: 0800-0900 PT Individual Time Calculation (min): 60 min    Short Term Goals: Week 1:  PT Short Term Goal 1 (Week 1): Pt will perform transfers with overall min assist PT Short Term Goal 2 (Week 1): Pt will be able to gait x 150' with min assist PT Short Term Goal 3 (Week 1): Pt will be able to perform dynamic standing balance with overall min assist PT Short Term Goal 4 (Week 1): Assessment by orthotist will be completed if appropriate to assess need for AFO for LLE.  Skilled Therapeutic Interventions/Progress Updates:    Pt received resting in bed with no c/o pain and agreeable to therapy session.  Session focus on overall activity tolerance, transfers, stair training, and gait training.  Pt transitions supine>sit with more than a reasonable amount of time and dons socks seated EOB with supervision.  PT donned shoes total assist and pt performed sit<>stand with RW and supervision and amb to w/c outside door with close supervision.  PT instructed pt in stair negotiation x4 steps with 2 handrails and min assist with verbal cues for energy conservation, breathing, and sequencing.  Pt performed kinetron 4x1 minute trials at 55-65 cm/s for overall endurance and LE strengthening.  Gait training x160' back to room with RW and close supervision with min verbal cues for upright posture.  Pt positioned in recliner with call bell in reach and needs met, NT in to sit with pt awaiting next therapy session.   Therapy Documentation Precautions:  Precautions Precautions: Fall, Other (comment) Precaution Comments: left sided weakness; sucide precautions Other Brace/Splint: L PRAFO in bed Restrictions Weight Bearing Restrictions: Yes RUE Weight Bearing: Weight bearing as tolerated LUE Weight Bearing: Weight bearing as tolerated RLE Weight Bearing:  Weight bearing as tolerated LLE Weight Bearing: Weight bearing as tolerated   See Function Navigator for Current Functional Status.   Therapy/Group: Individual Therapy  Yves Fodor E Penven-Crew 06/10/2016, 9:07 AM

## 2016-06-10 NOTE — Progress Notes (Signed)
Occupational Therapy Session Note  Patient Details  Name: [name] MRN: [id number] Date of Birth: Sep 01, 1981  Today's Date: 06/10/2016 OT Individual Time: 1300-1430 OT Individual Time Calculation (min): 90 min   Skilled Therapeutic Interventions/Progress Updates:    1:1 Therapeutic activity with focus on activity tolerance, dynamic standing balance, functional mobility in ADL kitchen while making pudding. Pt able to self propel to gym door with bilateral LEs with increased hip flexion strengthen and dorsiflexion (striking heel first on floor with left LE)  Pt able to gather all materials to make pudding with supervision with more than reasonable amt of time in standing with RW. Pt continues to demonstrate decr activity tolerance requiring prolonged rest breaks; pt at times with labored breathing but able to recover quickly. Pt with difficulty side stepping with RW through narrow passageways in an obstacle course on carpet requiring mod cuing for position of self and RW. Pt able to self propel back to room with UEs and LEs with supervision with one rest break at elevator.     Therapy Documentation Precautions:  Precautions Precautions: Fall, Other (comment) Precaution Comments: left sided weakness; sucide precautions Other Brace/Splint: L PRAFO in bed Restrictions Weight Bearing Restrictions: Yes RUE Weight Bearing: Weight bearing as tolerated LUE Weight Bearing: Weight bearing as tolerated RLE Weight Bearing: Weight bearing as tolerated LLE Weight Bearing: Weight bearing as tolerated  Pain:  no c/o pain in session  ADL: ADL ADL Comments: see functional navigator  See Function Navigator for Current Functional Status.   Therapy/Group: Individual Therapy  Roney Mans [facility] 06/10/2016, 3:26 PM

## 2016-06-10 NOTE — Progress Notes (Signed)
[place] PHYSICAL MEDICINE & REHABILITATION     PROGRESS NOTE    Subjective/Complaint:  Slept well. Pt anxious to discuss the events of yesterday   ROS: Pt denies fever, rash/itching, headache, blurred or double vision, nausea, vomiting, abdominal pain, diarrhea, chest pain, shortness of breath, palpitations, dysuria, dizziness,  , bleeding, acute anxiety, or depression   Objective: Vital Signs: Blood pressure 131/88, pulse 72, temperature 98.8 F (37.1 C), temperature source Oral, resp. rate 16, height 5\' 11"  (1.803 m), weight 80.9 kg (178 lb 6.4 oz), SpO2 100 %. No results found. No results for input(s): WBC, HGB, HCT, PLT in the last 72 hours.  Recent Labs  06/10/16 0541  CREATININE 0.73   CBG (last 3)  No results for input(s): GLUCAP in the last 72 hours.  Wt Readings from Last 3 Encounters:  06/10/16 80.9 kg (178 lb 6.4 oz)  06/03/16 79.5 kg (175 lb 3.2 oz)  05/20/16 81.6 kg (180 lb)    Physical Exam:  Constitutional: She is oriented to person, place, and time. She appears well-developedand well-nourished.  HENT:  Head: Normocephalicand atraumatic.  Mouth/Throat: Oropharynx is clear and moist.  Eyes: Conjunctivaeare normal. Pupils are equal, round, and reactive to light. Right eye exhibits no discharge. No scleral icterus.  Neck: Normal range of motion. Neck supple.  Cardiovascular: Regular rhythm. Tachycardiapresent.  Murmurheard. Respiratory: Effort normaland breath sounds normal. No stridor. No respiratory distress. She has no wheezes.  Right lateral chest incision has healed well. GI: Soft. Bowel sounds are normal. She exhibits no distension. There is no tenderness.  Midline incision healing with granulation tissue. Musculoskeletal: left foot minimally tender. Trace pedal edema. Neurological: She is alertand oriented to person, place, and time.  Speech clear. Able to follow commands without difficulty. LLE with sustained clonus.  Motor strength:  5-/5 in the right deltoid, biceps, triceps, grip, 4/5  hip flexor, knee extensor, ankle dorsiflexor. 3+ to 4 left deltoid, biceps, triceps, grip 3+/5, left hip flexor, 0 at the knee extensor, 2  left hip extensor, 0/5 at the ankle dorsiflexor and plantar flexor. Sensation mildly reduced to light touch in the left versus right upper and lower limb No facial droop Skin: Skin is warmand dry. No rashnoted. No erythema.  Psychiatric: Her behavior is normal. Thought contentnormal.  Pleasant, cooperative with me once again    Assessment/Plan: 1. Left hemiparesis and functional/cognitive deficits secondary to right MCA infarct/TBI/polytrauma which require 3+ hours per day of interdisciplinary therapy in a comprehensive inpatient rehab setting. Physiatrist is providing close team supervision and 24 hour management of active medical problems listed below. Physiatrist and rehab team continue to assess barriers to discharge/monitor patient progress toward functional and medical goals.  Function:  Bathing Bathing position   Position: Wheelchair/chair at sink  Bathing parts Body parts bathed by patient: Right arm, Left arm, Chest, Abdomen, Right upper leg, Left upper leg, Back, Right lower leg, Left lower leg Body parts bathed by helper: Buttocks, Right lower leg, Left lower leg, Back  Bathing assist Assist Level: Supervision or verbal cues, Set up   Set up : To obtain items  Upper Body Dressing/Undressing Upper body dressing   What is the patient wearing?: Pull over shirt/dress     Pull over shirt/dress - Perfomed by patient: Thread/unthread right sleeve, Thread/unthread left sleeve, Put head through opening, Pull shirt over trunk Pull over shirt/dress - Perfomed by helper: Thread/unthread right sleeve, Thread/unthread left sleeve, Pull shirt over trunk        Upper  body assist Assist Level: Set up   Set up : To obtain clothing/put away  Lower Body Dressing/Undressing Lower body  dressing   What is the patient wearing?: Pants, Non-skid slipper socks Underwear - Performed by patient: Pull underwear up/down Underwear - Performed by helper: Thread/unthread left underwear leg, Thread/unthread right underwear leg Pants- Performed by patient: Thread/unthread right pants leg, Pull pants up/down, Thread/unthread left pants leg Pants- Performed by helper: Thread/unthread left pants leg Non-skid slipper socks- Performed by patient: Don/doff right sock, Don/doff left sock Non-skid slipper socks- Performed by helper: Don/doff right sock, Don/doff left sock                  Lower body assist Assist for lower body dressing: Touching or steadying assistance (Pt > 75%)      Toileting Toileting Toileting activity did not occur: Refused Toileting steps completed by patient: Adjust clothing prior to toileting Toileting steps completed by helper: Performs perineal hygiene, Adjust clothing after toileting Toileting Assistive Devices: Grab bar or rail  Toileting assist Assist level: Touching or steadying assistance (Pt.75%)   Transfers Chair/bed transfer   Chair/bed transfer method: Ambulatory Chair/bed transfer assist level: Supervision or verbal cues Chair/bed transfer assistive device: Environmental consultant, Consulting civil engineer lift: Landscape architect     Max distance: 150 Assist level: Supervision or verbal cues   Wheelchair   Type: Manual Max wheelchair distance: 130 ft Assist Level: Supervision or verbal cues  Cognition Comprehension Comprehension assist level: Follows complex conversation/direction with no assist  Expression Expression assist level: Expresses complex ideas: With no assist  Social Interaction Social Interaction assist level: Interacts appropriately with others with medication or extra time (anti-anxiety, antidepressant).  Problem Solving Problem solving assist level: Solves complex problems: Recognizes & self-corrects  Memory Memory assist level:  Complete Independence: No helper   Medical Problem List and Plan: 1. 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 DVT Prophylaxis/Anticoagulation:   Lovenox indicated  -dopplers negative 3. Pain Management: tylenol prn  -?left 4th MT contusion---improving 4. Mood: Patient denies any anxiety, depressive symptoms, insomnia, nightmares or intent for self harm.   -psychiatry continues to recommend inpatient commitment to psych hospital. We discussed her need for this today.   -appreciate neuropysch input  -sitter continues 5. Neuropsych: This patient is not fullycapable of making decisions on herown behalf. 6. Skin/Wound Care: Monitor wound for healing.  7. Fluids/Electrolytes/Nutrition: Monitor by mouth intake, follow-up on bmet 8. Abdominal procedures with bowel resection: wound closing   -Continue bid dressing change to midline incision. 9. TBI/R-MCA infarct with DAI with hemorrhage: ASA added 10 New onset seizures: Controlled on vimpat bid. 11. Empyema RLL s/p VATS: Resolved. Respiratory status stable.  12. Constipation: improved with  Metamucil and increase of senna-s to 2 tab bid .   -improved  -encouraged fruits/vegetables/water 13 Depression: Mood appears stable at present  -on Zoloft daily and on Klonopin at bedtime.  14. ABLA: on iron supplement.    LOS (Days) 7 A FACE TO FACE EVALUATION WAS PERFORMED  [name] Bell T 06/10/2016 8:23 AM

## 2016-06-11 ENCOUNTER — Inpatient Hospital Stay (HOSPITAL_COMMUNITY): Payer: Medicaid Other | Admitting: Occupational Therapy

## 2016-06-11 ENCOUNTER — Inpatient Hospital Stay (HOSPITAL_COMMUNITY): Payer: Self-pay | Admitting: Occupational Therapy

## 2016-06-11 ENCOUNTER — Inpatient Hospital Stay (HOSPITAL_COMMUNITY): Payer: Medicaid Other | Admitting: Physical Therapy

## 2016-06-11 NOTE — Progress Notes (Signed)
[place] PHYSICAL MEDICINE & REHABILITATION     PROGRESS NOTE    Subjective/Complaint:  Slept well. Pt anxious to discuss the events of yesterday   ROS: Pt denies fever, rash/itching, headache, blurred or double vision, nausea, vomiting, abdominal pain, diarrhea, chest pain, shortness of breath, palpitations, dysuria, dizziness,  , bleeding, acute anxiety, or depression   Objective: Vital Signs: Blood pressure 138/77, pulse 65, temperature 98.6 F (37 C), temperature source Oral, resp. rate 18, height 5\' 11"  (1.803 m), weight 80.9 kg (178 lb 6.4 oz), SpO2 100 %. No results found. No results for input(s): WBC, HGB, HCT, PLT in the last 72 hours.  Recent Labs  06/10/16 0541  CREATININE 0.73   CBG (last 3)  No results for input(s): GLUCAP in the last 72 hours.  Wt Readings from Last 3 Encounters:  06/10/16 80.9 kg (178 lb 6.4 oz)  06/03/16 79.5 kg (175 lb 3.2 oz)  05/20/16 81.6 kg (180 lb)    Physical Exam:  Constitutional: She is oriented to person, place, and time. She appears well-developedand well-nourished.  HENT:  Head: Normocephalicand atraumatic.  Mouth/Throat: Oropharynx is clear and moist.  Eyes: Conjunctivaeare normal. Pupils are equal, round, and reactive to light. Right eye exhibits no discharge. No scleral icterus.  Neck: Normal range of motion. Neck supple.  Cardiovascular: Regular rhythm. Tachycardiapresent.  Murmurheard. Respiratory: Effort normaland breath sounds normal. No stridor. No respiratory distress. She has no wheezes.  Right lateral chest incision has healed well. GI: Soft. Bowel sounds are normal. She exhibits no distension. There is no tenderness.  Midline incision healing with granulation tissue. Musculoskeletal: left foot minimally tender. Trace pedal edema. Neurological: She is alertand oriented to person, place, and time.  Speech clear. Able to follow commands without difficulty. LLE with sustained clonus.  Motor strength:  5-/5 in the right deltoid, biceps, triceps, grip, 4/5  hip flexor, knee extensor, ankle dorsiflexor. 3+ to 4 left deltoid, biceps, triceps, grip 3+/5, left hip flexor, 0 at the knee extensor, 2  left hip extensor, 0/5 at the ankle dorsiflexor and plantar flexor. Sensation mildly reduced to light touch in the left versus right upper and lower limb No facial droop Skin: Skin is warmand dry. No rashnoted. No erythema.  Psychiatric: Her behavior is normal. Thought contentnormal.  Pleasant, cooperative with me     Assessment/Plan: 1. Left hemiparesis and functional/cognitive deficits secondary to right MCA infarct/TBI/polytrauma which require 3+ hours per day of interdisciplinary therapy in a comprehensive inpatient rehab setting. Physiatrist is providing close team supervision and 24 hour management of active medical problems listed below. Physiatrist and rehab team continue to assess barriers to discharge/monitor patient progress toward functional and medical goals.  Function:  Bathing Bathing position   Position: Wheelchair/chair at sink  Bathing parts Body parts bathed by patient: Right arm, Left arm, Chest, Abdomen, Right upper leg, Left upper leg, Back, Right lower leg, Left lower leg, Front perineal area, Buttocks Body parts bathed by helper: Buttocks, Right lower leg, Left lower leg, Back  Bathing assist Assist Level: Touching or steadying assistance(Pt > 75%)   Set up : To obtain items  Upper Body Dressing/Undressing Upper body dressing   What is the patient wearing?: Hospital gown     Pull over shirt/dress - Perfomed by patient: Thread/unthread right sleeve, Thread/unthread left sleeve, Put head through opening, Pull shirt over trunk Pull over shirt/dress - Perfomed by helper: Thread/unthread right sleeve, Thread/unthread left sleeve, Pull shirt over trunk  Upper body assist Assist Level: Set up   Set up : To obtain clothing/put away  Lower Body  Dressing/Undressing Lower body dressing   What is the patient wearing?: Non-skid slipper socks, Hospital Gown Underwear - Performed by patient: Pull underwear up/down Underwear - Performed by helper: Thread/unthread left underwear leg, Thread/unthread right underwear leg Pants- Performed by patient: Thread/unthread right pants leg, Pull pants up/down, Thread/unthread left pants leg Pants- Performed by helper: Thread/unthread left pants leg Non-skid slipper socks- Performed by patient: Don/doff right sock, Don/doff left sock Non-skid slipper socks- Performed by helper: Don/doff right sock, Don/doff left sock     Shoes - Performed by patient: Don/doff right shoe, Don/doff left shoe Shoes - Performed by helper: Don/doff left shoe, Don/doff right shoe, Fasten right, Fasten left (to slide over heel)          Lower body assist Assist for lower body dressing: Touching or steadying assistance (Pt > 75%)      Toileting Toileting Toileting activity did not occur: Refused Toileting steps completed by patient: Adjust clothing prior to toileting Toileting steps completed by helper: Performs perineal hygiene, Adjust clothing after toileting Toileting Assistive Devices: Grab bar or rail  Toileting assist Assist level: Touching or steadying assistance (Pt.75%)   Transfers Chair/bed transfer   Chair/bed transfer method: Ambulatory Chair/bed transfer assist level: Supervision or verbal cues Chair/bed transfer assistive device: Armrests, Biochemist, clinical lift: Landscape architect     Max distance: 160 Assist level: Supervision or verbal cues   Wheelchair   Type: Manual Max wheelchair distance: 130 ft Assist Level: Supervision or verbal cues  Cognition Comprehension Comprehension assist level: Follows complex conversation/direction with no assist  Expression Expression assist level: Expresses complex ideas: With no assist  Social Interaction Social Interaction assist level:  Interacts appropriately with others with medication or extra time (anti-anxiety, antidepressant).  Problem Solving Problem solving assist level: Solves complex problems: Recognizes & self-corrects  Memory Memory assist level: Complete Independence: No helper   Medical Problem List and Plan: 1. 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 DVT Prophylaxis/Anticoagulation:   Lovenox indicated  -dopplers negative 3. Pain Management: tylenol prn  -left MT pain--resolved 4. Mood: Patient denies any anxiety, depressive symptoms, insomnia, nightmares or intent for self harm.   -psychiatry continues to recommend inpatient commitment to psych hospital. Will discharge from rehab to inpatient psych facility   -appreciate neuropysch input  -sitter continues 5. Neuropsych: This patient is not fullycapable of making decisions on herown behalf. 6. Skin/Wound Care: continue wet-dry dressing abd  -dc remaining sutures today.  7. Fluids/Electrolytes/Nutrition: Monitor by mouth intake, follow-up on bmet 8. Abdominal procedures with bowel resection: wound closing   -Continue bid dressing change to midline incision. 9. TBI/R-MCA infarct with DAI with hemorrhage: ASA added 10 New onset seizures: Controlled on vimpat bid. 11. Empyema RLL s/p VATS: Resolved. Respiratory status stable.  12. Constipation: improved with  Metamucil and increase of senna-s to 2 tab bid .   -improved  -encouraged fruits/vegetables/water 13 Depression: Mood appears stable at present  -on Zoloft daily and on Klonopin at bedtime.  14. ABLA: on iron supplement.    LOS (Days) 8 A FACE TO FACE EVALUATION WAS PERFORMED  Toshi Ishii T 06/11/2016 8:53 AM

## 2016-06-11 NOTE — Progress Notes (Signed)
Occupational Therapy Weekly Progress Note  Patient Details  Name: [name] Bell MRN: [id number] Date of Birth: Oct 11, 1981  Beginning of progress report period: July 04, 2016 End of progress report period: June 11, 2016   Patient has met 4 of 4 short term goals.  Pt continues to make steady progress towards LTG. Pt is able to bathe at sink/ shower level and dress with steadying A to supervision. Pt continues to demonstrate decr activity tolerance and requires frequent prolonged rest breaks. Pt does present with inconsistencies with left LE strength and coordination seen with dorsiflexion (in ambulation and propelling w/c with bilateral LEs). Plan currently to d/c to inpatient psych facility and currently is still in suicide precautions and a sitter.  Patient continues to demonstrate the following deficits: decr activity/ tolerance, endurance and standing balance and therefore will continue to benefit from skilled OT intervention to enhance overall performance with BADL and iADL.  Patient progressing toward long term goals..  Continue plan of care.  OT Short Term Goals Week 1:  OT Short Term Goal 1 (Week 1): Pt will transfer to toilet/ BSC with minA with LRAD OT Short Term Goal 1 - Progress (Week 1): Met OT Short Term Goal 2 (Week 1): Pt will don LB clothing sit to stand with min A with AE prn  OT Short Term Goal 2 - Progress (Week 1): Met OT Short Term Goal 3 (Week 1): Pt will perform 2 grooming tasks in standing at sink wiht min A OT Short Term Goal 3 - Progress (Week 1): Met OT Short Term Goal 4 (Week 1): Pt will demonstrate emergent awareness in ADL with min questioning cues OT Short Term Goal 4 - Progress (Week 1): Met Week 2:  OT Short Term Goal 1 (Week 2): LTG=STG overall supervision   Skilled Therapeutic Interventions/Progress Updates:     Therapy Documentation Precautions:  Precautions Precautions: Fall, Other (comment) Precaution Comments: left sided weakness; sucide  precautions Other Brace/Splint: L PRAFO in bed Restrictions Weight Bearing Restrictions: Yes RUE Weight Bearing: Weight bearing as tolerated LUE Weight Bearing: Weight bearing as tolerated RLE Weight Bearing: Weight bearing as tolerated LLE Weight Bearing: Weight bearing as tolerated Pain:  inconsistencies in sessions ADL: ADL ADL Comments: see functional navigator  See Function Navigator for Current Functional Status.   Therapy/Group: Individual Therapy  Willeen [name] [facility] 06/11/2016, 3:30 PM

## 2016-06-11 NOTE — Progress Notes (Signed)
Occupational Therapy Session Note  Patient Details  Name: [name] Bell MRN: [id number] Date of Birth: 06-Jan-1981  Today's Date: 06/11/2016 OT Individual Time: 1420-1500 OT Individual Time Calculation (min): 40 min     Short Term Goals:Week 1:  OT Short Term Goal 1 (Week 1): Pt will transfer to toilet/ BSC with minA with LRAD OT Short Term Goal 2 (Week 1): Pt will don LB clothing sit to stand with min A with AE prn  OT Short Term Goal 3 (Week 1): Pt will perform 2 grooming tasks in standing at sink wiht min A OT Short Term Goal 4 (Week 1): Pt will demonstrate emergent awareness in ADL with min questioning cues  Skilled Therapeutic Interventions/Progress Updates:    Pt seen for OT session focusing on functional activity tolerance/ endurance. Pt sitting EOB upon arrival, agreeable to tx session. She donned shoes with increased time, able to use pants to assist with bringing ankle over knee. She declined walking to therapy day room due to fatigue. Therefore, peddled w/c using B LEs for strengthening.  She watered plants in therapy day room at supervision level side stepping with RW and using B UEs in functional task. She completed table top game of Dominoes, alternating btwn sitting and standing. Sit <> stands completed at supervision level. She tolerated ~1 minute of static standing before requiring seated rest break.  Pt left in therapy day room with NT/ sitter to play another game of Dominoes in sitting.  She required VCs throughout session for deep breathing techniques as pt became anxious with increased respiration rate with fatigue.   Therapy Documentation Precautions:  Precautions Precautions: Fall, Other (comment) Precaution Comments: left sided weakness; sucide precautions Other Brace/Splint: L PRAFO in bed Restrictions Weight Bearing Restrictions: Yes RUE Weight Bearing: Weight bearing as tolerated LUE Weight Bearing: Weight bearing as tolerated RLE Weight Bearing: Weight  bearing as tolerated LLE Weight Bearing: Weight bearing as tolerated Pain: Pain Assessment Pain Assessment: No/denies pain ADL: ADL ADL Comments: see functional navigator  See Function Navigator for Current Functional Status.   Therapy/Group: Individual Therapy  Lewis, Fathima Bartl C 06/11/2016, 1:12 PM

## 2016-06-11 NOTE — Progress Notes (Signed)
Occupational Therapy Session Note  Patient Details  Name: [name] Bell MRN: [id number] Date of Birth: September 25, 1981  Today's Date: 06/11/2016 OT Individual Time: 1100-1210 OT Individual Time Calculation (min): 70 min     Short Term Goals: Week 1:  OT Short Term Goal 1 (Week 1): Pt will transfer to toilet/ BSC with minA with LRAD OT Short Term Goal 2 (Week 1): Pt will don LB clothing sit to stand with min A with AE prn  OT Short Term Goal 3 (Week 1): Pt will perform 2 grooming tasks in standing at sink wiht min A OT Short Term Goal 4 (Week 1): Pt will demonstrate emergent awareness in ADL with min questioning cues  Skilled Therapeutic Interventions/Progress Updates:    Pt seen for skilled OT session focusing on self care. Pt supine in bed upon arrival and agreeable to tx session. Pt required min A to stand with bed height adjusted and ambulated with RW and CGA into bathroom. Pt transferred to tub transfer bench needing assist moving L leg into tub, but was able to cross BLE to donn/doff socks and thread/unthread pants. Pt completed shower with supervision needing VC to perform lateral leans to wash buttocks and assist washing back. Pt required frequent rest breaks during shower session. OT educated on energy conservation during session.  OT propelled pt to family room due to energy conservation and had pt ambulate with RW to gather magazines. OT propelled pt back to room and had pt ambulate with RW completing sit to stand with min A back to bed. Pt able to recall steps to rolling in bed, but needed assist moving LLE. Pt left supine in bed with sitter present and all needs met.   Therapy Documentation Precautions:  Precautions Precautions: Fall, Other (comment) Precaution Comments: left sided weakness; sucide precautions Other Brace/Splint: L PRAFO in bed Restrictions Weight Bearing Restrictions: Yes RUE Weight Bearing: Weight bearing as tolerated LUE Weight Bearing: Weight bearing as  tolerated RLE Weight Bearing: Weight bearing as tolerated LLE Weight Bearing: Weight bearing as tolerated Pain: Pain Assessment Pain Assessment: No/denies pain ADL: ADL ADL Comments: see functional navigator  See Function Navigator for Current Functional Status.   Therapy/Group: Individual Therapy  Matilde Bash 06/11/2016, 12:27 PM

## 2016-06-11 NOTE — Plan of Care (Signed)
Problem: RH SKIN INTEGRITY Goal: RH STG ABLE TO PERFORM INCISION/WOUND CARE W/ASSISTANCE STG Able To Perform Incision/Wound Care With min Assistance.   Outcome: Progressing Removed sutures from (R) neck and hip.

## 2016-06-11 NOTE — Progress Notes (Addendum)
Physical Therapy Weekly Progress Note  Patient Details  Name: [name] Bell MRN: [id number] Date of Birth: 24-Mar-1981  Beginning of progress report period: June 04, 2016 End of progress report period: June 11, 2016  Today's Date: 06/11/2016 PT Individual Time: 0925-1025 and 1300-1345 PT Individual Time Calculation (min): 60 min and 45 min  Patient has met 3 of 4 short term goals. Patient making steady progress this reporting period and currently requires supervision-steady assist overall with increased assist needed for sit <> stand from low surfaces and for stair negotiation. Patient remains limited by decreased activity tolerance. Patient's sister has been present intermittently to observe therapy sessions and is supportive.   Patient continues to demonstrate the following deficits: muscle weakness, muscle paralysis, decreased cardiorespiratory endurance, decreased coordination, decreased safety awareness, decreased standing balance, decreased postural control, hemiplegia and decreased balance strategies and therefore will continue to benefit from skilled PT intervention to enhance overall performance with activity tolerance, balance, postural control, ability to compensate for deficits, functional use of  left upper extremity and left lower extremity and coordination.  Patient progressing toward long term goals.  Continue plan of care.  PT Short Term Goals Week 1:  PT Short Term Goal 1 (Week 1): Pt will perform transfers with overall min assist PT Short Term Goal 1 - Progress (Week 1): Met PT Short Term Goal 2 (Week 1): Pt will be able to gait x 150' with min assist PT Short Term Goal 2 - Progress (Week 1): Met PT Short Term Goal 3 (Week 1): Pt will be able to perform dynamic standing balance with overall min assist PT Short Term Goal 3 - Progress (Week 1): Met PT Short Term Goal 4 (Week 1): Assessment by orthotist will be completed if appropriate to assess need for AFO for LLE. PT  Short Term Goal 4 - Progress (Week 1): Not met (not working towards this goal at this time) Week 2:  PT Short Term Goal 1 (Week 2): = LTGs due to anticipated LOS   Skilled Therapeutic Interventions/Progress Updates:    Treatment 1: Patient in bed upon arrival, RN departing after removing sutures from neck. Patient peformed LB dressing seated EOB with sit <> stand with close supervision and donned socks and shoes seated EOB with more than reasonable time. Patient performed sit > stand from EOB with two attempts and ambulated from room > therapy gym using RW with supervision and cues for upright posture, forward gaze, and "stacking shoulders over hips" to work towards hip ext to neutral due to leaning forward on RW. Patient instructed in sit <> stand transfer training from raised therapy mat with BUE support progressed to hands on knees progressed to no UE support with visual/verbal cues for technique and breathing. Patient performed sit <> stand from low compliant couch surface with min A overall and cues for technique. Patient performed ambulatory transfers using RW with supervision and transferred sit > supine with min A to lift LLE onto bed and verbal cues for technique. Patient left semi reclined in bed with all needs in reach and sitter present.   Treatment 2: Patient in bed upon arrival. Performed LB dressing via sit <> stand to don pants and required assist to don socks/shoes. Patient ambulated from room > wheelchair outside room using RW with close supervision. Stair training up/down 8 (3") stairs using 2 rails with close supervision and up/down 4 (6") stairs using 2 rails with min A and step-to pattern with increased time. Patient instructed in propelling  wheelchair using BLE only for neuro re-ed forwards and backwards with min A overall with focus on LLE heel strike, knee flexion, and knee extension. Patient required prolonged rest breaks due to SOB and fatigue with verbal cues for breathing  technique. Patient requested to return to bed at end of session, performed ambulatory transfer using RW and left sitting EOB with sitter present and needs in reach.   Therapy Documentation Precautions:  Precautions Precautions: Fall, Other (comment) Precaution Comments: left sided weakness; sucide precautions Other Brace/Splint: L PRAFO in bed Restrictions Weight Bearing Restrictions: Yes RUE Weight Bearing: Weight bearing as tolerated LUE Weight Bearing: Weight bearing as tolerated RLE Weight Bearing: Weight bearing as tolerated LLE Weight Bearing: Weight bearing as tolerated Pain: Pain Assessment Pain Assessment: No/denies pain  See Function Navigator for Current Functional Status.  Therapy/Group: Individual Therapy  Laretta Alstrom 06/11/2016, 10:28 AM

## 2016-06-12 ENCOUNTER — Inpatient Hospital Stay (HOSPITAL_COMMUNITY): Payer: Self-pay | Admitting: Physical Therapy

## 2016-06-12 DIAGNOSIS — Z9151 Personal history of suicidal behavior: Secondary | ICD-10-CM

## 2016-06-12 DIAGNOSIS — Z915 Personal history of self-harm: Secondary | ICD-10-CM

## 2016-06-12 DIAGNOSIS — Z9189 Other specified personal risk factors, not elsewhere classified: Secondary | ICD-10-CM

## 2016-06-12 NOTE — Progress Notes (Signed)
Physical Therapy Session Note  Patient Details  Name: [name] Bell MRN: [id number] Date of Birth: 1981/07/02  Today's Date: 06/12/2016 PT Individual Time:   [phone] and 6144-3154 PT Individual Time Calculation: 25 min and 45 min   Short Term Goals:Week 2:  PT Short Term Goal 1 (Week 2): = LTGs due to anticipated LOS  Skilled Therapeutic Interventions/Progress Updates:    Pt received semi reclined in bed with sitter present. Required min guard for sit > stand from EOB (lowest height) with RW to don pants Upon standing, pt notes feeling ill due to having just taken medications without breakfast (which arrived as PT was entering room). Pt therefore requesting to eat x20 min prior to continuing therapy. Upon PT return, pt donned shoes with increased time. Performed stand pivot transfer from bed <> w/c with RW and min guard-min A for sit > stand. Transported to gym with total A for time management. Donned L PLS AFO ([employer]) due to limited L ankle DF and L steppage gait pattern. Performed gait x80' with RW and supervision. After standing rest break (< 30 seconds), pt negotiated 8 stairs with B rails. After seated rest break, performed gait x30' without AD with min guard-min A. Session ended in pt room, where pt was left seated EOB with sitter present, all needs within reach.   Therapy Documentation Precautions:  Precautions Precautions: Fall, Other (comment) Precaution Comments: left sided weakness; sucide precautions Required Braces or Orthoses: Other Brace/Splint Other Brace/Splint: L PRAFO in bed Restrictions Weight Bearing Restrictions: Yes RUE Weight Bearing: Weight bearing as tolerated LUE Weight Bearing: Weight bearing as tolerated RLE Weight Bearing: Weight bearing as tolerated LLE Weight Bearing: Weight bearing as tolerated Other Position/Activity Restrictions: WBAT for gait training beginning 04/30/16 Pain: Pain Assessment Pain Assessment: No/denies pain Locomotion  : Ambulation Ambulation: Yes Ambulation/Gait Assistance: 5: Supervision;4: Min guard;4: Min assist Ambulation Distance (Feet): 80 Feet Assistive device: Rolling walker;Other (Comment) (L AFO) Ambulation/Gait Assistance Details: While wearing L AFO ([employer] PLS), performed gait x80' with RW and supervision, then x30' without AD with min guard-min A.  Stairs / Additional Locomotion  Stairs: Yes Stairs Assistance: 5: Supervision Stair Management Technique: Two rails;Forwards;Alternating pattern;Other (comment) (wearing L PLS AFO) Number of Stairs: 8    See Function Navigator for Current Functional Status.   Therapy/Group: Individual Therapy  Karandeep Resende, Lorenda Ishihara 06/12/2016, 6:06 PM

## 2016-06-12 NOTE — Progress Notes (Signed)
[place] PHYSICAL MEDICINE & REHABILITATION     PROGRESS NOTE    Subjective/Complaint: Pt sitting up in bed eating "Funions".  She states she always wakes up very early.   ROS: Denies CP, SOB, N/V/D.    Objective: Vital Signs: Blood pressure 128/82, pulse 74, temperature 99.1 F (37.3 C), temperature source Oral, resp. rate 18, height  (1.803 m), weight 82.6 kg (182 lb), SpO2 100 %. No results found. No results for input(s): WBC, HGB, HCT, PLT in the last 72 hours.  Recent Labs  06/10/16 0541  CREATININE 0.73   CBG (last 3)  No results for input(s): GLUCAP in the last 72 hours.  Wt Readings from Last 3 Encounters:  06/12/16 82.6 kg (182 lb)  06/03/16 79.5 kg (175 lb 3.2 oz)  05/20/16 81.6 kg (180 lb)    Physical Exam:  Constitutional: She appears well-developedand well-nourished.  HENT:  Normocephalicand atraumatic.  Eyes: Conjunctivaeand EOM are normal.  Cardiovascular: Regular rate and rhythm.  Respiratory: Effort normaland breath sounds normal. No stridor. No respiratory distress. She has no wheezes.  Right lateral chest incision has healed well.  GI: Soft. Bowel sounds are normal. She exhibits no distension. There is no tenderness.  Midline incision healing with granulation tissue. Musculoskeletal: No edema, no tenderness. Neurological: She is alertand oriented.  Speech clear. Able to follow commands without difficulty.  Motor strength: 4+/5 in the right deltoid, biceps, triceps, grip, RLE: 5/5  hip flexor, knee extensor, ankle dorsiflexor. LUE: 4/5 left deltoid, biceps, triceps, grip LLE; 2+/5, hip flexion, knee extension, 1/5 at the ankle dorsiflexor and plantar flexor. No facial droop Skin: Skin is warmand dry. No rashnoted. No erythema.  Psychiatric: Her behavior is normal. Thought contentnormal.  Pleasant, cooperative   Assessment/Plan: 1. Left hemiparesis and functional/cognitive deficits secondary to right MCA infarct/TBI/polytrauma  which require 3+ hours per day of interdisciplinary therapy in a comprehensive inpatient rehab setting. Physiatrist is providing close team supervision and 24 hour management of active medical problems listed below. Physiatrist and rehab team continue to assess barriers to discharge/monitor patient progress toward functional and medical goals.  Function:  Bathing Bathing position Bathing activity did not occur: N/A Position: Wheelchair/chair at sink  Bathing parts Body parts bathed by patient: Right arm, Left arm, Chest, Abdomen, Right upper leg, Left upper leg, Back, Right lower leg, Left lower leg, Front perineal area, Buttocks Body parts bathed by helper: Buttocks, Right lower leg, Left lower leg, Back  Bathing assist Assist Level: Touching or steadying assistance(Pt > 75%)   Set up : To obtain items  Upper Body Dressing/Undressing Upper body dressing   What is the patient wearing?: Hospital gown     Pull over shirt/dress - Perfomed by patient: Thread/unthread right sleeve, Thread/unthread left sleeve, Put head through opening, Pull shirt over trunk Pull over shirt/dress - Perfomed by helper: Thread/unthread right sleeve, Thread/unthread left sleeve, Pull shirt over trunk        Upper body assist Assist Level: Set up   Set up : To obtain clothing/put away  Lower Body Dressing/Undressing Lower body dressing   What is the patient wearing?: Non-skid slipper socks, Hospital Gown, Underwear Underwear - Performed by patient: Thread/unthread right underwear leg, Thread/unthread left underwear leg Underwear - Performed by helper: Pull underwear up/down Pants- Performed by patient: Thread/unthread right pants leg, Thread/unthread left pants leg, Pull pants up/down Pants- Performed by helper: Thread/unthread left pants leg Non-skid slipper socks- Performed by patient: Don/doff right sock, Don/doff left sock Non-skid slipper  socks- Performed by helper: Don/doff right sock, Don/doff left  sock     Shoes - Performed by patient: Don/doff right shoe, Don/doff left shoe Shoes - Performed by helper: Don/doff left shoe, Don/doff right shoe, Fasten right, Fasten left (to slide over heel)          Lower body assist Assist for lower body dressing: Touching or steadying assistance (Pt > 75%)      Toileting Toileting Toileting activity did not occur: Refused Toileting steps completed by patient: Adjust clothing prior to toileting, Performs perineal hygiene, Adjust clothing after toileting Toileting steps completed by helper: Performs perineal hygiene, Adjust clothing after toileting Toileting Assistive Devices: Grab bar or rail  Toileting assist Assist level: Touching or steadying assistance (Pt.75%)   Transfers Chair/bed transfer   Chair/bed transfer method: Ambulatory Chair/bed transfer assist level: Touching or steadying assistance (Pt > 75%) Chair/bed transfer assistive device: Armrests, Walker Mechanical lift: Landscape architect     Max distance: 160 Assist level: Supervision or verbal cues   Wheelchair   Type: Manual Max wheelchair distance: 130 ft Assist Level: Supervision or verbal cues  Cognition Comprehension Comprehension assist level: Follows complex conversation/direction with no assist  Expression Expression assist level: Expresses complex ideas: With no assist  Social Interaction Social Interaction assist level: Interacts appropriately with others with medication or extra time (anti-anxiety, antidepressant).  Problem Solving Problem solving assist level: Solves complex problems: Recognizes & self-corrects  Memory Memory assist level: Complete Independence: No helper   Medical Problem List and Plan: 1. 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 DVT Prophylaxis/Anticoagulation:   Lovenox indicated  -dopplers negative 3. Pain Management:  tylenol prn 4. Mood: Patient has denied any anxiety, depressive symptoms, insomnia, nightmares or intent for self harm.   -psychiatry continues to recommend inpatient commitment to psych hospital. Will discharge from rehab to inpatient psych facility   -appreciate neuropysch input  -sitter continues 5. Neuropsych: This patient is not fullycapable of making decisions on herown behalf. 6. Skin/Wound Care: continue wet-dry dressing abd  7. Fluids/Electrolytes/Nutrition: Monitor by mouth intake 8. Abdominal procedures with bowel resection:  -Continue dressing changes 9. TBI/R-MCA infarct with DAI with hemorrhage: ASA added 10 New onset seizures: Controlled on vimpat bid. 11. Empyema RLL s/p VATS: Resolved. Respiratory status stable.  12. Constipation: improved with  Metamucil and increase of senna-s to 2 tab bid .   -improved  -encouraged fruits/vegetables/water 13 Depression: Mood appears stable at present  -on Zoloft daily and on Klonopin at bedtime.  14. ABLA: on iron supplement.    LOS (Days) 9 A FACE TO FACE EVALUATION WAS PERFORMED  Ankit Karis Juba 06/12/2016 4:48 PM

## 2016-06-13 ENCOUNTER — Inpatient Hospital Stay (HOSPITAL_COMMUNITY): Payer: Medicaid Other | Admitting: Physical Therapy

## 2016-06-13 NOTE — Progress Notes (Signed)
[place] PHYSICAL MEDICINE & REHABILITATION     PROGRESS NOTE    Subjective/Complaint: Pt laying in bed this AM watching videos on her phone.  She slept well overnight.  She denies complaints.   ROS: Denies CP, SOB, N/V/D.    Objective: Vital Signs: Blood pressure 130/88, pulse 67, temperature 98.9 F (37.2 C), temperature source Oral, resp. rate 18, height 5\' 11"  (1.803 m), weight 81.7 kg (180 lb 1.6 oz), SpO2 100 %. No results found. No results for input(s): WBC, HGB, HCT, PLT in the last 72 hours. No results for input(s): NA, K, CL, GLUCOSE, BUN, CREATININE, CALCIUM in the last 72 hours.  Invalid input(s): CO CBG (last 3)  No results for input(s): GLUCAP in the last 72 hours.  Wt Readings from Last 3 Encounters:  06/13/16 81.7 kg (180 lb 1.6 oz)  06/03/16 79.5 kg (175 lb 3.2 oz)  05/20/16 81.6 kg (180 lb)    Physical Exam:  Constitutional: She appears well-developedand well-nourished.  HENT:  Normocephalicand atraumatic.  Eyes: Conjunctivaeand EOM are normal.  Cardiovascular: Regular rate and rhythm.  Respiratory: Effort normaland breath sounds normal. No stridor. No respiratory distress. She has no wheezes.  Right lateral chest incision has healed well.  GI: Soft. Bowel sounds are normal. She exhibits no distension. There is no tenderness.  Midline incision healing with granulation tissue. Musculoskeletal: No edema, no tenderness. Neurological: She is alertand oriented.  Speech clear. Able to follow commands without difficulty.  Motor strength: 4+/5 in the right deltoid, biceps, triceps, grip, RLE: 5/5  hip flexor, knee extensor, ankle dorsiflexor. LUE: 4/5 left deltoid, biceps, triceps, grip LLE; 2+/5, hip flexion, knee extension, 1/5 at the ankle dorsiflexor and plantar flexor. No facial droop Skin: Skin is warmand dry. No rashnoted. No erythema.  Psychiatric: Her behavior is normal. Thought contentnormal.  Pleasant, cooperative    Assessment/Plan: 1. Left hemiparesis and functional/cognitive deficits secondary to right MCA infarct/TBI/polytrauma which require 3+ hours per day of interdisciplinary therapy in a comprehensive inpatient rehab setting. Physiatrist is providing close team supervision and 24 hour management of active medical problems listed below. Physiatrist and rehab team continue to assess barriers to discharge/monitor patient progress toward functional and medical goals.  Function:  Bathing Bathing position Bathing activity did not occur: N/A Position: Wheelchair/chair at sink  Bathing parts Body parts bathed by patient: Right arm, Left arm, Chest, Abdomen, Right upper leg, Left upper leg, Back, Right lower leg, Left lower leg, Front perineal area, Buttocks Body parts bathed by helper: Buttocks, Right lower leg, Left lower leg, Back  Bathing assist Assist Level: Touching or steadying assistance(Pt > 75%)   Set up : To obtain items  Upper Body Dressing/Undressing Upper body dressing   What is the patient wearing?: Hospital gown     Pull over shirt/dress - Perfomed by patient: Thread/unthread right sleeve, Thread/unthread left sleeve, Put head through opening, Pull shirt over trunk Pull over shirt/dress - Perfomed by helper: Thread/unthread right sleeve, Thread/unthread left sleeve, Pull shirt over trunk        Upper body assist Assist Level: Set up   Set up : To obtain clothing/put away  Lower Body Dressing/Undressing Lower body dressing   What is the patient wearing?: Non-skid slipper socks, Hospital Gown, Underwear Underwear - Performed by patient: Thread/unthread right underwear leg, Thread/unthread left underwear leg Underwear - Performed by helper: Pull underwear up/down Pants- Performed by patient: Thread/unthread right pants leg, Thread/unthread left pants leg, Pull pants up/down Pants- Performed by helper: Thread/unthread  left pants leg Non-skid slipper socks- Performed by patient:  Don/doff right sock, Don/doff left sock Non-skid slipper socks- Performed by helper: Don/doff right sock, Don/doff left sock     Shoes - Performed by patient: Don/doff right shoe, Don/doff left shoe Shoes - Performed by helper: Don/doff left shoe, Don/doff right shoe, Fasten right, Fasten left (to slide over heel)          Lower body assist Assist for lower body dressing: Touching or steadying assistance (Pt > 75%)      Toileting Toileting Toileting activity did not occur: Refused Toileting steps completed by patient: Adjust clothing prior to toileting, Performs perineal hygiene, Adjust clothing after toileting Toileting steps completed by helper: Performs perineal hygiene, Adjust clothing after toileting Toileting Assistive Devices: Grab bar or rail  Toileting assist Assist level: Touching or steadying assistance (Pt.75%)   Transfers Chair/bed transfer   Chair/bed transfer method: Ambulatory Chair/bed transfer assist level: Touching or steadying assistance (Pt > 75%) Chair/bed transfer assistive device: Armrests, Walker Mechanical lift: Landscape architect     Max distance: 80 Assist level: Touching or steadying assistance (Pt > 75%)   Wheelchair   Type: Manual Max wheelchair distance: 130 ft Assist Level: Supervision or verbal cues  Cognition Comprehension Comprehension assist level: Follows complex conversation/direction with no assist  Expression Expression assist level: Expresses complex ideas: With no assist  Social Interaction Social Interaction assist level: Interacts appropriately with others with medication or extra time (anti-anxiety, antidepressant).  Problem Solving Problem solving assist level: Solves complex problems: Recognizes & self-corrects  Memory Memory assist level: Complete Independence: No helper   Medical Problem List and Plan: 1. 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 DVT Prophylaxis/Anticoagulation:   Lovenox indicated  -dopplers negative 3. Pain Management: tylenol prn 4. Mood: Patient has denied any anxiety, depressive symptoms, insomnia, nightmares or intent for self harm.   -psychiatry continues to recommend inpatient commitment to psych hospital. Will discharge from rehab to inpatient psych facility   -appreciate neuropysch input  -sitter continues 5. Neuropsych: This patient is not fullycapable of making decisions on herown behalf. 6. Skin/Wound Care: continue wet-dry dressing abd  7. Fluids/Electrolytes/Nutrition: Monitor by mouth intake 8. Abdominal procedures with bowel resection:  -Continue dressing changes 9. TBI/R-MCA infarct with DAI with hemorrhage: ASA added 10 New onset seizures: Controlled on vimpat bid. 11. Empyema RLL s/p VATS: Resolved. Respiratory status stable.  12. Constipation: improved with  Metamucil and increased senna-s to 2 tab bid.   -improved  -encouraged fruits/vegetables/water 13 Depression: Mood appears stable at present  -on Zoloft daily and on Klonopin at bedtime.  14. ABLA: on iron supplement.    LOS (Days) 10 A FACE TO FACE EVALUATION WAS PERFORMED  Selwyn Reason Karis Juba 06/13/2016 7:28 AM

## 2016-06-13 NOTE — Progress Notes (Signed)
Physical Therapy Session Note  Patient Details  Name: [name] Bell MRN: [id number] Date of Birth: [birth year]  Today's Date: 06/13/2016 PT Individual Time: 1530-1615 PT Individual Time Calculation (min): 45 min    Short Term Goals: Week 2:  PT Short Term Goal 1 (Week 2): = LTGs due to anticipated LOS   Skilled Therapeutic Interventions/Progress Updates:    Pt received sitting on EOB with RN requesting to apply new dressing to pt's abdomen as first one had fallen off. Pt returned after RN care; pt missed 15 minutes skilled PT treatment. Pt transported to gym via w/c total A for gait training. PT donned socks, shoes & L AFO total A for time management. Gait training x 90 ft with RW & supervision with pt demonstrating decreased gait speed but noting less fatigue after task on this date compared to yesterday. Gait training x 20 ft without AD & min A but pt noting B hip pain therefore activity ceased. Educated pt on use of LBQC and gait pattern with AD. Gait training x 35 ft + [facility] & close supervision with pt demonstrating decreased gait speed, decreased step length & cuing for gait pattern. Discussed advantages & disadvantages of RW versus LBQC with pt voicing she feels as though RW is more beneficial at this time. At end of session pt left sitting on EOB with sitter present & all needs within reach.  PT returned pt's shoes, AFO, and w/c to proper storage place outside of pt's room. Placed extra gown on linen cart outside of pt's door.   Therapy Documentation Precautions:  Precautions Precautions: Fall, Other (comment) Precaution Comments: left sided weakness; sucide precautions Required Braces or Orthoses: Other Brace/Splint Other Brace/Splint: L PRAFO in bed Restrictions Weight Bearing Restrictions: Yes RUE Weight Bearing: Weight bearing as tolerated LUE Weight Bearing: Weight bearing as tolerated RLE Weight Bearing: Weight bearing as tolerated LLE Weight Bearing: Weight  bearing as tolerated Other Position/Activity Restrictions: WBAT for gait training beginning 04/30/16  General: PT Amount of Missed Time (min): 15 Minutes PT Missed Treatment Reason: Nursing care  Pain: Pain Assessment Pain Assessment: No/denies pain   See Function Navigator for Current Functional Status.   Therapy/Group: Individual Therapy  Sandi MariscalVictoria M Miller 06/13/2016, 4:33 PM

## 2016-06-14 ENCOUNTER — Inpatient Hospital Stay (HOSPITAL_COMMUNITY): Payer: Medicaid Other | Admitting: Occupational Therapy

## 2016-06-14 ENCOUNTER — Inpatient Hospital Stay (HOSPITAL_COMMUNITY): Payer: Self-pay

## 2016-06-14 ENCOUNTER — Inpatient Hospital Stay (HOSPITAL_COMMUNITY): Payer: Self-pay | Admitting: Physical Therapy

## 2016-06-14 ENCOUNTER — Encounter (HOSPITAL_COMMUNITY): Payer: Self-pay

## 2016-06-14 ENCOUNTER — Inpatient Hospital Stay (HOSPITAL_COMMUNITY): Payer: Medicaid Other

## 2016-06-14 LAB — CBC
HCT: 34 % — ABNORMAL LOW (ref 36.0–46.0)
Hemoglobin: 10.8 g/dL — ABNORMAL LOW (ref 12.0–15.0)
MCH: 26.4 pg (ref 26.0–34.0)
MCHC: 31.8 g/dL (ref 30.0–36.0)
MCV: 83.1 fL (ref 78.0–100.0)
PLATELETS: 239 10*3/uL (ref 150–400)
RBC: 4.09 MIL/uL (ref 3.87–5.11)
RDW: 14.5 % (ref 11.5–15.5)
WBC: 8.5 10*3/uL (ref 4.0–10.5)

## 2016-06-14 LAB — BASIC METABOLIC PANEL
Anion gap: 9 (ref 5–15)
BUN: 14 mg/dL (ref 6–20)
CALCIUM: 9.7 mg/dL (ref 8.9–10.3)
CO2: 21 mmol/L — ABNORMAL LOW (ref 22–32)
Chloride: 110 mmol/L (ref 101–111)
Creatinine, Ser: 0.72 mg/dL (ref 0.44–1.00)
GFR calc Af Amer: >60 mL/min (ref >60)
GLUCOSE: 119 mg/dL — AB (ref 65–99)
Potassium: 4.1 mmol/L (ref 3.5–5.1)
Sodium: 140 mmol/L (ref 135–145)

## 2016-06-14 NOTE — Progress Notes (Signed)
Physical Therapy Session Note  Patient Details  Name: [name] Bell MRN: [id number] Date of Birth: 10-Jun-1981  Today's Date: 06/14/2016 PT Individual Time: 1310-1358 PT Individual Time Calculation (min): 48 min    Short Term Goals: Week 2:  PT Short Term Goal 1 (Week 2): = LTGs due to anticipated LOS  Skilled Therapeutic Interventions/Progress Updates:    Session focused on pt practicing donning and doffing shoe with L AFO seated EOB, dynamic balance and sit <> stands to perform lower body dressing (pants, socks, and shoes with AFO), gait training with WBQC and L PLS AFO with focus on step length, gait pattern, and endurance in straight gait pattern as well as through obstacles, sit <> stands and transfers throughout session with supervision for safety with balance and verbal cues for technique, and w/c mobility for general endurance back to room at end of session with verbal cues for efficient propulsion technique. Pt will benefit from continued practice for gait training over carpeted surfaces, obstacles, and practice donning/doffing AFO.  Handoff to RN at end of session.   Therapy Documentation Precautions:  Precautions Precautions: Fall, Other (comment) Precaution Comments: left sided weakness; sucide precautions Required Braces or Orthoses: Other Brace/Splint Other Brace/Splint: L AFO Restrictions Weight Bearing Restrictions: No RUE Weight Bearing: Weight bearing as tolerated LUE Weight Bearing: Weight bearing as tolerated RLE Weight Bearing: Weight bearing as tolerated LLE Weight Bearing: Weight bearing as tolerated Other Position/Activity Restrictions: WBAT for gait training beginning 04/30/16    Pain: No complaints of pain.  See Function Navigator for Current Functional Status.   Therapy/Group: Individual Therapy  Karolee StampsGray, Tameem Pullara Darrol PokeBrescia  Wellington Winegarden B. Mareta Chesnut, PT, DPT  06/14/2016, 2:14 PM

## 2016-06-14 NOTE — Progress Notes (Signed)
Occupational Therapy Session Note  Patient Details  Name: [name] Bell MRN: [id number] Date of Birth: August 17, 1981  Today's Date: 06/14/2016 OT Individual Time: 0950-1100 OT Individual Time Calculation (min): 70 min  and Today's Date: 06/14/2016 OT Missed Time: 20 Minutes Missed Time Reason: Other (comment) (pt had just received breakfast and needed to eat first)     Short Term Goals:Week 1:  OT Short Term Goal 1 (Week 1): Pt will transfer to toilet/ BSC with minA with LRAD OT Short Term Goal 1 - Progress (Week 1): Met OT Short Term Goal 2 (Week 1): Pt will don LB clothing sit to stand with min A with AE prn  OT Short Term Goal 2 - Progress (Week 1): Met OT Short Term Goal 3 (Week 1): Pt will perform 2 grooming tasks in standing at sink wiht min A OT Short Term Goal 3 - Progress (Week 1): Met OT Short Term Goal 4 (Week 1): Pt will demonstrate emergent awareness in ADL with min questioning cues OT Short Term Goal 4 - Progress (Week 1): Met Week 2:  OT Short Term Goal 1 (Week 2): LTG=STG overall supervision      Skilled Therapeutic Interventions/Progress Updates:    Pt in room stating that she had just received breakfast and that she really needed to eat. Pt requested to have some time to finish breakfast. Pt completed eating and sat up to EOB, stood with RW and transferred to w.c with RW with close S. Pt preferred to sponge bathe at sink versus shower due to abdominal wound. Pt completed sponge bath with S. Ambulated with RW to toilet with S. Pt needed to move slowly but was able to complete all bathing (except back) and dressing with Set up/Supervision. Pt able to sit to stand several times without A. Pt stated she is continuing to deal with muscular fatigue and needs several rest breaks.  Discussed at length several options for patient after discharge to get involved in based on her interests such as local art museums, [employer], bookstores, etc. Pt requested a written list of  suggestions as she is fairly new to this area.  Pt in w/c, hand off to PT for a make up session.  Therapy Documentation Precautions:  Precautions Precautions: Fall, Other (comment) Precaution Comments: left sided weakness; sucide precautions Required Braces or Orthoses: Other Brace/Splint Other Brace/Splint: L PRAFO in bed Restrictions Weight Bearing Restrictions: Yes RUE Weight Bearing: Weight bearing as tolerated LUE Weight Bearing: Weight bearing as tolerated RLE Weight Bearing: Weight bearing as tolerated LLE Weight Bearing: Weight bearing as tolerated Other Position/Activity Restrictions: WBAT for gait training beginning 04/30/16  General OT Amount of Missed Time: 20 Minutes    Pain: Pain Assessment Pain Assessment: No/denies pain ADL: ADL ADL Comments: see functional navigator  See Function Navigator for Current Functional Status.   Therapy/Group: Individual Therapy  [place] 06/14/2016, 10:15 AM

## 2016-06-14 NOTE — Progress Notes (Signed)
Physical Therapy Note  Patient Details  Name: [name] Bell MRN: [id number] Date of Birth: 07-14-1981 Today's Date: 06/14/2016  1130-1205, 35 min individual tx Pain: c/o soreness R quad muscle, unrated  neuromuscular re-education via hand out, manual and VCs, demo for standing: heel /toe raises, mini squats.  self stretching L hamstrings and heel cords with set up, x 5 minutes for hamstrings, x 30 seconds x 2 for heel cord.  Dual task pt counted backwards 30>0 without problem; when asked to count 50> 20, pt ended at 30. W/c propulsion using bil UEs x 150' with supervision and extra time. W/c> bed transfer. Pt left resting in bed with sitter present.  [name] Bell 06/14/2016, 11:56 AM

## 2016-06-14 NOTE — Progress Notes (Signed)
[facility] PHYSICAL MEDICINE & REHABILITATION     PROGRESS NOTE    Subjective/Complaint: Speaking to Neuropsych, concerned about  Post d/c plans Reiterated 8/10 d/c  ROS: Denies CP, SOB, N/V/D.    Objective: Vital Signs: Blood pressure 129/74, pulse 76, temperature 98.7 F (37.1 C), temperature source Oral, resp. rate 16, height  (1.803 m), weight 83.3 kg (183 lb 9.6 oz), SpO2 100 %. No results found. No results for input(s): WBC, HGB, HCT, PLT in the last 72 hours. No results for input(s): NA, K, CL, GLUCOSE, BUN, CREATININE, CALCIUM in the last 72 hours.  Invalid input(s): CO CBG (last 3)  No results for input(s): GLUCAP in the last 72 hours.  Wt Readings from Last 3 Encounters:  06/14/16 83.3 kg (183 lb 9.6 oz)  06/03/16 79.5 kg (175 lb 3.2 oz)  05/20/16 81.6 kg (180 lb)    Physical Exam:  Constitutional: She appears well-developedand well-nourished.  HENT:  Normocephalicand atraumatic.  Eyes: Conjunctivaeand EOM are normal.  Cardiovascular: Regular rate and rhythm.  Respiratory: Effort normaland breath sounds normal. No stridor. No respiratory distress. She has no wheezes.  Right lateral chest incision has healed well.  GI: Soft. Bowel sounds are normal. She exhibits no distension. There is no tenderness.  Midline incision healing with granulation tissue. Musculoskeletal: No edema, no tenderness. Neurological: She is alertand oriented.  Speech clear. Able to follow commands without difficulty.  Motor strength: 4+/5 in the right deltoid, biceps, triceps, grip, RLE: 5/5  hip flexor, knee extensor, ankle dorsiflexor. LUE: 4/5 left deltoid, biceps, triceps, grip LLE; 2+/5, hip flexion, knee extension, 1/5 at the ankle dorsiflexor and plantar flexor. No facial droop Skin: Skin is warmand dry. No rashnoted. No erythema.  Psychiatric: Her behavior is normal. Thought contentnormal.  Pleasant, cooperative   Assessment/Plan: 1. Left hemiparesis and  functional/cognitive deficits secondary to right MCA infarct/TBI/polytrauma which require 3+ hours per day of interdisciplinary therapy in a comprehensive inpatient rehab setting. Physiatrist is providing close team supervision and 24 hour management of active medical problems listed below. Physiatrist and rehab team continue to assess barriers to discharge/monitor patient progress toward functional and medical goals.  Function:  Bathing Bathing position Bathing activity did not occur: N/A Position: Wheelchair/chair at sink  Bathing parts Body parts bathed by patient: Right arm, Left arm, Chest, Abdomen, Right upper leg, Left upper leg, Back, Right lower leg, Left lower leg, Front perineal area, Buttocks Body parts bathed by helper: Buttocks, Right lower leg, Left lower leg, Back  Bathing assist Assist Level: Touching or steadying assistance(Pt > 75%)   Set up : To obtain items  Upper Body Dressing/Undressing Upper body dressing   What is the patient wearing?: Hospital gown     Pull over shirt/dress - Perfomed by patient: Thread/unthread right sleeve, Thread/unthread left sleeve, Put head through opening, Pull shirt over trunk Pull over shirt/dress - Perfomed by helper: Thread/unthread right sleeve, Thread/unthread left sleeve, Pull shirt over trunk        Upper body assist Assist Level: Set up   Set up : To obtain clothing/put away  Lower Body Dressing/Undressing Lower body dressing   What is the patient wearing?: Non-skid slipper socks, Hospital Gown, Underwear Underwear - Performed by patient: Thread/unthread right underwear leg, Thread/unthread left underwear leg Underwear - Performed by helper: Pull underwear up/down Pants- Performed by patient: Thread/unthread right pants leg, Thread/unthread left pants leg, Pull pants up/down Pants- Performed by helper: Thread/unthread left pants leg Non-skid slipper socks- Performed by patient:  Don/doff right sock, Don/doff left  sock Non-skid slipper socks- Performed by helper: Don/doff right sock, Don/doff left sock     Shoes - Performed by patient: Don/doff right shoe, Don/doff left shoe Shoes - Performed by helper: Don/doff left shoe, Don/doff right shoe, Fasten right, Fasten left (to slide over heel)          Lower body assist Assist for lower body dressing: Touching or steadying assistance (Pt > 75%)      Toileting Toileting Toileting activity did not occur: Refused Toileting steps completed by patient: Adjust clothing prior to toileting, Performs perineal hygiene, Adjust clothing after toileting Toileting steps completed by helper: Performs perineal hygiene, Adjust clothing after toileting Toileting Assistive Devices: Grab bar or rail  Toileting assist Assist level: More than reasonable time   Transfers Chair/bed transfer   Chair/bed transfer method: Ambulatory Chair/bed transfer assist level: Touching or steadying assistance (Pt > 75%) Chair/bed transfer assistive device: Armrests, Walker Mechanical lift: Landscape architecttedy   Locomotion Ambulation     Max distance: 80 Assist level: Touching or steadying assistance (Pt > 75%)   Wheelchair   Type: Manual Max wheelchair distance: 130 ft Assist Level: Supervision or verbal cues  Cognition Comprehension Comprehension assist level: Follows complex conversation/direction with no assist  Expression Expression assist level: Expresses complex ideas: With no assist  Social Interaction Social Interaction assist level: Interacts appropriately with others with medication or extra time (anti-anxiety, antidepressant).  Problem Solving Problem solving assist level: Solves complex problems: Recognizes & self-corrects  Memory Memory assist level: Complete Independence: No helper   Medical Problem List and Plan: 1. 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 DVT Prophylaxis/Anticoagulation:   Lovenox indicated  -dopplers negative 3. Pain Management: tylenol prn 4. Mood: Patient has denied any anxiety, depressive symptoms, insomnia, nightmares or intent for self harm.   -psychiatry continues to recommend inpatient commitment to psych hospital. Will discharge from rehab to inpatient psych facility,   -appreciate neuropysch input  -sitter continues 5. Neuropsych: This patient is not fullycapable of making decisions on herown behalf. 6. Skin/Wound Care: continue wet-dry dressing abd  7. Fluids/Electrolytes/Nutrition: Monitor by mouth intake 8. Abdominal procedures with bowel resection:  -Continue dressing changes 9. TBI/R-MCA infarct with DAI with hemorrhage: ASA added 10 New onset seizures: Controlled on vimpat bid. 11. Empyema RLL s/p VATS: Resolved. Respiratory status stable.  12. Constipation: improved with  Metamucil and increased senna-s to 2 tab bid.   -improved  -encouraged fruits/vegetables/water 13 Depression: Mood appears stable at present  -on Zoloft daily and on Klonopin at bedtime.  14. ABLA: on iron supplement.    LOS (Days) 11 A FACE TO FACE EVALUATION WAS PERFORMED  Charlotte Fidalgo E 06/14/2016 9:01 AM

## 2016-06-14 NOTE — Progress Notes (Signed)
Orthopedic Tech Progress Note Patient Details:  [name] Bell 1981/06/10 [id number]  Patient ID: [name] Bell, female   DOB: 1981/06/10, 35 y.o.   MRN: [id number]   Saul FordyceJennifer C Ronniesha Seibold 06/14/2016, 4:04 PM Called [employer] for left AFO brace.

## 2016-06-14 NOTE — Progress Notes (Signed)
Physical Therapy Session Note  Patient Details  Name: [name] Bell MRN: [id number] Date of Birth: 1980-11-17  Today's Date: 06/14/2016 PT Individual Time: 1530-1615 PT Individual Time Calculation (min): 45 min    Short Term Goals: Week 2:  PT Short Term Goal 1 (Week 2): = LTGs due to anticipated LOS  Skilled Therapeutic Interventions/Progress Updates:  Pt received EOB; no reports of pain.  Performed stand pivot bed > w/c with UE support on arm rests and supervision.  In w/c pt donned socks and therapist assisted with donning shoes and PLS.  Pt performed w/c mobility x 75' to dayroom with supervision but verbal cues for UE propulsion sequencing.  In dayroom pt engaged in functional gait training with PLS and quad cane over carpet while negotiating R and L around obstacles + lateral stepping L and R around obstacles and negotiating low threshold with supervision but verbal cues for sequence.  Pt continues to present with decreased weight shift and stance time on LLE and decreased step length RLE.  In gym performed one step negotiation training with quad cane and PLS with therapist demonstrating first and pt giving repeat demonstration x 4 reps with supervision and verbal cues for sequence.  Continued training for weight shifting, lateral stepping and foot clearance with pt performing R and L lateral stepping while stepping over low obstacle to facilitate increased L weight shift and full foot clearance and advancement.  At end of session pt transferred back to bed without AD.  Shoes, socks, PLS and scrub pants removed from room with w/c.  Pt left EOB with sitter present.     Therapy Documentation Precautions:  Precautions Precautions: Fall, Other (comment) Precaution Comments: left sided weakness; sucide precautions Required Braces or Orthoses: Other Brace/Splint Other Brace/Splint: L AFO Restrictions Weight Bearing Restrictions: No RUE Weight Bearing: Weight bearing as tolerated LUE Weight  Bearing: Weight bearing as tolerated RLE Weight Bearing: Weight bearing as tolerated LLE Weight Bearing: Weight bearing as tolerated Other Position/Activity Restrictions: WBAT for gait training beginning 04/30/16   See Function Navigator for Current Functional Status.   Therapy/Group: Individual Therapy  Edman CircleHall, Ashwika Freels [facility] 06/14/2016, 4:22 PM

## 2016-06-14 NOTE — Progress Notes (Addendum)
Physical Therapy Session Note  Patient Details  Name: Phylliss BobKatia D Sabina MRN: [id number] Date of Birth: 03-18-1981  Today's Date: 06/14/2016 PT Individual Time: 1110-1134 PT Individual Time Calculation (min): 24 min    Short Term Goals: Week 2:  PT Short Term Goal 1 (Week 2): = LTGs due to anticipated LOS  Skilled Therapeutic Interventions/Progress Updates:    PT seen to make up missed time.  Pt received in handoff from OT & agreeable to tx, denying c/o pain. Pt requested to propel w/c instead of ambulating to gym for energy conservation. Pt propelled w/c x 160 ft with BUE & supervision, requiring extra time to complete task. Pt [facility] to [facility]. PT donned L AFO total A for time management. Gait training x 110 ft + 90 ft with LBQC, L AFO, and close supervision. PT provided cuing to progress from step-to to step-through gait pattern. Pt with improved coordination of LBQC while stepping simultaneously with LLE on this date but did experience some losses of balance but able to correct independently. Pt required frequent sitting rest breaks 2/2 fatigue. At end of session pt left in handoff to another PT.  Therapy Documentation Precautions:  Precautions Precautions: Fall, Other (comment) Precaution Comments: left sided weakness; sucide precautions Required Braces or Orthoses: Other Brace/Splint Other Brace/Splint: L PRAFO in bed Restrictions Weight Bearing Restrictions: Yes RUE Weight Bearing: Weight bearing as tolerated LUE Weight Bearing: Weight bearing as tolerated RLE Weight Bearing: Weight bearing as tolerated LLE Weight Bearing: Weight bearing as tolerated Other Position/Activity Restrictions: WBAT for gait training beginning 04/30/16  Pain: Pain Assessment Pain Assessment: No/denies pain   See Function Navigator for Current Functional Status.   Therapy/Group: Individual Therapy  Sandi MariscalVictoria M Christabel Camire 06/14/2016, 12:13 PM

## 2016-06-15 ENCOUNTER — Inpatient Hospital Stay (HOSPITAL_COMMUNITY): Payer: Medicaid Other | Admitting: *Deleted

## 2016-06-15 ENCOUNTER — Inpatient Hospital Stay (HOSPITAL_COMMUNITY): Payer: Medicaid Other | Admitting: Occupational Therapy

## 2016-06-15 ENCOUNTER — Inpatient Hospital Stay (HOSPITAL_COMMUNITY): Payer: Medicaid Other | Admitting: Physical Therapy

## 2016-06-15 MED ORDER — ASPIRIN 81 MG PO TBEC: 81.0000 mg | DELAYED_RELEASE_TABLET | Freq: Every day | ORAL | Status: DC

## 2016-06-15 MED ORDER — METHOCARBAMOL 500 MG PO TABS: 500.0000 mg | ORAL_TABLET | Freq: Four times a day (QID) | ORAL | Status: DC | PRN

## 2016-06-15 MED ORDER — SERTRALINE HCL 100 MG PO TABS: 100.0000 mg | ORAL_TABLET | Freq: Every day | ORAL | Status: DC

## 2016-06-15 MED ORDER — CLONAZEPAM 0.5 MG PO TABS: 0.5000 mg | ORAL_TABLET | Freq: Every day | ORAL | 0 refills | Status: DC

## 2016-06-15 MED ORDER — BACITRACIN ZINC 500 UNIT/GM EX OINT: TOPICAL_OINTMENT | Freq: Two times a day (BID) | CUTANEOUS | 0 refills | Status: DC

## 2016-06-15 MED ORDER — SENNOSIDES-DOCUSATE SODIUM 8.6-50 MG PO TABS: 2.0000 | ORAL_TABLET | Freq: Two times a day (BID) | ORAL | Status: DC

## 2016-06-15 MED ORDER — PSYLLIUM 95 % PO PACK: 1.0000 | PACK | Freq: Every day | ORAL | Status: DC

## 2016-06-15 MED ORDER — MUSCLE RUB 10-15 % EX CREA: 1.0000 "application " | TOPICAL_CREAM | Freq: Three times a day (TID) | CUTANEOUS | 0 refills | Status: DC

## 2016-06-15 MED ORDER — ADULT MULTIVITAMIN W/MINERALS CH: 1.0000 | ORAL_TABLET | Freq: Every day | ORAL | Status: DC

## 2016-06-15 MED ORDER — METHOCARBAMOL 500 MG PO TABS
500.0000 mg | ORAL_TABLET | Freq: Two times a day (BID) | ORAL | Status: DC | PRN
  Administered 2016-06-16: 500 mg via ORAL
  Filled 2016-06-15: qty 1

## 2016-06-15 MED ORDER — GUAIFENESIN-DM 100-10 MG/5ML PO SYRP: 5.0000 mL | ORAL_SOLUTION | Freq: Four times a day (QID) | ORAL | 0 refills | Status: DC | PRN

## 2016-06-15 MED ORDER — LACOSAMIDE 100 MG PO TABS: 100.0000 mg | ORAL_TABLET | Freq: Two times a day (BID) | ORAL | Status: DC

## 2016-06-15 MED ORDER — ATORVASTATIN CALCIUM 20 MG PO TABS: 20.0000 mg | ORAL_TABLET | Freq: Every day | ORAL | Status: DC

## 2016-06-15 MED ORDER — FERROUS GLUCONATE 324 (38 FE) MG PO TABS: 324.0000 mg | ORAL_TABLET | Freq: Two times a day (BID) | ORAL | 3 refills | Status: DC

## 2016-06-15 MED ORDER — SIMETHICONE 80 MG PO CHEW: 80.0000 mg | CHEWABLE_TABLET | Freq: Four times a day (QID) | ORAL | 0 refills | Status: DC | PRN

## 2016-06-15 MED ORDER — ALUM & MAG HYDROXIDE-SIMETH 200-200-20 MG/5ML PO SUSP: 30.0000 mL | ORAL | 0 refills | Status: DC | PRN

## 2016-06-15 MED ORDER — ONDANSETRON HCL 4 MG PO TABS: 4.0000 mg | ORAL_TABLET | Freq: Four times a day (QID) | ORAL | 0 refills | Status: DC | PRN

## 2016-06-15 MED ORDER — DIPHENHYDRAMINE HCL 25 MG PO CAPS: 25.0000 mg | ORAL_CAPSULE | Freq: Three times a day (TID) | ORAL | 0 refills | Status: DC | PRN
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
## 2016-06-15 NOTE — Progress Notes (Signed)
Occupational Therapy Session Note  Patient Details  Name: [name] Bell MRN: [id number] Date of Birth: [birth year]  Today's Date: 06/15/2016 OT Individual Time: [id number] OT Individual Time Calculation (min): 65 min     Short Term Goals:Week 2:  OT Short Term Goal 1 (Week 2): LTG=STG overall supervision  Skilled Therapeutic Interventions/Progress Updates:    Pt seen for skilled OT to facilitate planning for social/ recreational engagement post discharge and therapeutic activity of grooming activity. Pt was already dressed for the day. She requested to work on painting her nails in the day room as she needs to be with a therapist to do this activity.  Pt worked on UE strength with w/c mobility and maneuvering w/c around tables. Pt filed and painted her own nails. During this time, pt provided with list of resources that pertained to her interests.  Discussed how she can integrate her hobby of photography, poetry, and her recent experiences.  Pt discussed her frustration with the recommendations for continued inpt care and is eager to go home as she does not have any SI at this time. Discussed possible positive outcomes of continued therapy to better prepare her for return to the community. Pt completed activity and was taken back to her room.   Therapy Documentation Precautions:  Precautions Precautions: Fall, Other (comment) Precaution Comments: left sided weakness; sucide precautions Required Braces or Orthoses: Other Brace/Splint Other Brace/Splint: L AFO Restrictions Weight Bearing Restrictions: Yes RUE Weight Bearing: Weight bearing as tolerated LUE Weight Bearing: Weight bearing as tolerated RLE Weight Bearing: Weight bearing as tolerated LLE Weight Bearing: Weight bearing as tolerated Other Position/Activity Restrictions: WBAT for gait training beginning 04/30/16    Vital Signs: Therapy Vitals Temp: 98.4 F (36.9 C) Temp Source: Oral Pulse Rate: 66 Resp: 18 BP: (!)  149/94 Patient Position (if appropriate): Lying Oxygen Therapy SpO2: 100 % O2 Device: Not Delivered Pain: Pain Assessment Pain Assessment: No/denies pain ADL: ADL ADL Comments: see functional navigator  See Function Navigator for Current Functional Status.   Therapy/Group: Individual Therapy  Meghna Hagmann 06/15/2016, 8:59 AM
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
## 2016-06-15 NOTE — Progress Notes (Signed)
Physical Therapy Session Note  Patient Details  Name: [name] Bell MRN: [id number] Date of Birth: 12-27-1980  Today's Date: 06/15/2016 PT Individual Time: 0800-0915 and 1300-1400 PT Individual Time Calculation (min): 75 min and 60 min   Short Term Goals: Week 2:  PT Short Term Goal 1 (Week 2): = LTGs due to anticipated LOS  Skilled Therapeutic Interventions/Progress Updates:    Treatment 1: Skilled co-treat with recreational therapist for functional transfers and ambulation, standing balance, and activity tolerance. Patient standing at sink using RW with sitter upon arrival. Patient performed LB dressing with sit > stand from EOB with supervision to pull up pants over hips and donned socks and shoes with L PLS AFO seated EOB with setup assist and increased time. Gait training using WBQC x 25 ft with close supervision and max verbal/visual cues for full L step length, L heel strike, reciprocal gait pattern, and upright posture/forward gaze. Patient became tearful and required seated rest break and transported to therapy gym. Engaged in Wii Just Dance game to challenge dynamic standing balance, coordination, and endurance x 3 min + 1 min + 3 min. Patient requesting to toilet, returned to room and performed toileting tasks with distant supervision and ambulated to/from bathroom and to sink for hand hygiene using [facility] with close supervision. Patient engaged in standing dancing to another song per patient request before returning to room. Patient left sitting in wheelchair with call bell in reach and sitter present.    Treatment 2: Patient seen with orthotist Maisie [facility]ushomas from [facility] to assess orthotic fit. Patient ambulated up to 75 ft using [facility] and no AFO with supervision and after donning L PLS AFO total Bell for continued ambulation in controlled environment using WBQC up to 150 ft with supervision with cues for forward gaze and L heel strike. Recommending PLS AFO at this time. Patient negotiated up/down 4  (6") stairs using 2 rails forwards with supervision and up/down 4 (6") stairs laterally using BUE support on L rail ascending with min Bell overall and cues for sequencing and technique as patient reports being unsure of railing setup to second floor at sister's house. Patient reporting increased R hip pain and frustration with this method and provided prolonged rest break. Patient propelled wheelchair 2 x 160 ft with supervision and increased time with cues for efficient technique. Patient requested to return to bed, ambulated back to bed using [facility] and doffed pants and socks/shoes seated EOB with supervision. Patient required min Bell to lift LLE onto bed with sit > supine with verbal cues for technique. Patient left semi reclined in bed with all needs in reach.   Therapy Documentation Precautions:  Precautions Precautions: Fall, Other (comment) Precaution Comments: left sided weakness; sucide precautions Required Braces or Orthoses: Other Brace/Splint Other Brace/Splint: L AFO Restrictions Weight Bearing Restrictions: Yes RUE Weight Bearing: Weight bearing as tolerated LUE Weight Bearing: Weight bearing as tolerated RLE Weight Bearing: Weight bearing as tolerated LLE Weight Bearing: Weight bearing as tolerated Other Position/Activity Restrictions: WBAT for gait training beginning 04/30/16 Pain: Pain Assessment Pain Assessment: No/denies pain  See Function Navigator for Current Functional Status.   Therapy/Group: Individual Therapy  Kerney ElbeVarner, [name] Bell 06/15/2016, 9:20 AM

## 2016-06-15 NOTE — Progress Notes (Signed)
Recreational Therapy Session Note  Patient Details  Name: [name] Bell MRN: [id number] Date of Birth: 1980/11/21 Today's Date: 06/15/2016  Pain: c/o stomach cramping after activity, pt felt need to have BM Skilled Therapeutic Interventions/Progress Updates: Session focused on activity tolerance & dynamic balance during co-treat with PT.  Pt ambulated ~25 with LBQC and close supervision/verbal cues for technique. After seated rest break, pt stood to play Wii Just Dance 3 min: 1 min; 3 min with focus on activity tolerance, dynamic balance, UE exercise, coordination.  Pt with stomach cramping after dancing and felt need to have a BM.  Pt returned to room via w/c Total assist for toileting. Pt requesting to dance to 1 once more song prior to sessions end.  Pt left in room with sitter present.   Therapy/Group: Co-Treatment  Carol Theys 06/15/2016, 10:10 AM
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
## 2016-06-15 NOTE — Progress Notes (Signed)
[place] PHYSICAL MEDICINE & REHABILITATION     PROGRESS NOTE    Subjective/Complaint: No issues overnight, denies any abdominal pain, good appetite, no urinary issues. Discussed therapeutic dissipation, overall supervision level using other cane or walker, modified independent wheelchair  ROS: Denies CP, SOB, N/V/D.    Objective: Vital Signs: Blood pressure (!) 149/94, pulse 66, temperature 98.4 F (36.9 C), temperature source Oral, resp. rate 18, height  (1.803 m), weight 82.9 kg (182 lb 12.8 oz), SpO2 100 %. No results found.  Recent Labs  06/14/16 1502  WBC 8.5  HGB 10.8*  HCT 34.0*  PLT 239    Recent Labs  06/14/16 1502  NA 140  K 4.1  CL 110  GLUCOSE 119*  BUN 14  CREATININE 0.72  CALCIUM 9.7   CBG (last 3)  No results for input(s): GLUCAP in the last 72 hours.  Wt Readings from Last 3 Encounters:  06/15/16 82.9 kg (182 lb 12.8 oz)  06/03/16 79.5 kg (175 lb 3.2 oz)  05/20/16 81.6 kg (180 lb)    Physical Exam:  Constitutional: She appears well-developedand well-nourished.  HENT:  Normocephalicand atraumatic.  Eyes: Conjunctivaeand EOM are normal.  Cardiovascular: Regular rate and rhythm.  Respiratory: Effort normaland breath sounds normal. No stridor. No respiratory distress. She has no wheezes.  Right lateral chest incision has healed well.  GI: Soft. Bowel sounds are normal. She exhibits no distension. There is no tenderness.  Midline incision healing with granulation tissue. Musculoskeletal: No edema, no tenderness. Neurological: She is alertand oriented.  Speech clear. Able to follow commands without difficulty.  Sensation intact in the left lower extremity Deep tendon reflexes 3+ left patellar, 2+ right patellar Motor strength: 4+/5 in the right deltoid, biceps, triceps, grip, RLE: 5/5  hip flexor, knee extensor, ankle dorsiflexor. LUE: 4/5 left deltoid, biceps, triceps, grip LLE; 2+/5, hip flexion, knee extension, 1/5 at the  ankle dorsiflexor and plantar flexor. No facial droop Skin: Skin is warmand dry. No rashnoted. No erythema.  Psychiatric: Her behavior is normal. Thought contentnormal.  Pleasant, cooperative   Assessment/Plan: 1. Left hemiparesis and functional/cognitive deficits secondary to right MCA infarct/TBI/polytrauma which require 3+ hours per day of interdisciplinary therapy in a comprehensive inpatient rehab setting. Physiatrist is providing close team supervision and 24 hour management of active medical problems listed below. Physiatrist and rehab team continue to assess barriers to discharge/monitor patient progress toward functional and medical goals.  Function:  Bathing Bathing position Bathing activity did not occur: N/A Position: Wheelchair/chair at sink  Bathing parts Body parts bathed by patient: Right arm, Left arm, Chest, Abdomen, Right upper leg, Left upper leg, Right lower leg, Left lower leg, Front perineal area, Buttocks Body parts bathed by helper: Back  Bathing assist Assist Level: Touching or steadying assistance(Pt > 75%)   Set up : To obtain items  Upper Body Dressing/Undressing Upper body dressing   What is the patient wearing?: Hospital gown     Pull over shirt/dress - Perfomed by patient: Thread/unthread right sleeve, Thread/unthread left sleeve, Put head through opening, Pull shirt over trunk Pull over shirt/dress - Perfomed by helper: Thread/unthread right sleeve, Thread/unthread left sleeve, Pull shirt over trunk        Upper body assist Assist Level: Set up   Set up : To obtain clothing/put away  Lower Body Dressing/Undressing Lower body dressing   What is the patient wearing?: Non-skid slipper socks, Hospital Gown, Pants Underwear - Performed by patient: Thread/unthread right underwear leg, Thread/unthread left  underwear leg Underwear - Performed by helper: Pull underwear up/down Pants- Performed by patient: Thread/unthread right pants leg,  Thread/unthread left pants leg, Pull pants up/down Pants- Performed by helper: Thread/unthread left pants leg Non-skid slipper socks- Performed by patient: Don/doff right sock, Don/doff left sock Non-skid slipper socks- Performed by helper: Don/doff right sock, Don/doff left sock     Shoes - Performed by patient: Don/doff right shoe, Don/doff left shoe, Fasten right, Fasten left Shoes - Performed by helper: Don/doff left shoe, Don/doff right shoe, Fasten right, Fasten left (to slide over heel)          Lower body assist Assist for lower body dressing: Supervision or verbal cues      Toileting Toileting Toileting activity did not occur: Refused Toileting steps completed by patient: Adjust clothing prior to toileting, Performs perineal hygiene, Adjust clothing after toileting Toileting steps completed by helper: Adjust clothing prior to toileting, Performs perineal hygiene, Adjust clothing after toileting Toileting Assistive Devices: Grab bar or rail  Toileting assist Assist level: Supervision or verbal cues, More than reasonable time   Transfers Chair/bed transfer   Chair/bed transfer method: Stand pivot Chair/bed transfer assist level: Supervision or verbal cues Chair/bed transfer assistive device: Armrests Mechanical lift: Landscape architecttedy   Locomotion Ambulation     Max distance: 115' Assist level: Supervision or verbal cues   Wheelchair   Type: Manual Max wheelchair distance: 160 ft Assist Level: Supervision or verbal cues  Cognition Comprehension Comprehension assist level: Follows complex conversation/direction with no assist  Expression Expression assist level: Expresses complex ideas: With no assist  Social Interaction Social Interaction assist level: Interacts appropriately with others with medication or extra time (anti-anxiety, antidepressant).  Problem Solving Problem solving assist level: Solves complex problems: Recognizes & self-corrects  Memory Memory assist level:  Recognizes or recalls 90% of the time/requires cueing < 10% of the time   Medical Problem List and Plan: 1. 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  -improved standing balance 2. DVT Prophylaxis/Anticoagulation:   Lovenox indicated  -dopplers negative 3. Pain Management: tylenol prn 4. Mood: Patient has denied any anxiety, depressive symptoms, insomnia, nightmares or intent for self harm.   -psychiatry continues to recommend inpatient commitment to psych hospital. Will discharge from rehab to inpatient psych facility,   -appreciate neuropysch input  -sitter continues 5. Neuropsych: This patient is not fullycapable of making decisions on herown behalf. 6. Skin/Wound Care: continue wet-dry dressing abd  7. Fluids/Electrolytes/Nutrition: Monitor by mouth intake 8. Abdominal procedures with bowel resection:  -Continue dressing changes 9. TBI/R-MCA infarct with DAI with hemorrhage: ASA added 10 New onset seizures: Controlled on vimpat bid. 11. Empyema RLL s/p VATS: Resolved. Respiratory status stable.  12. Constipation: improved with  Metamucil and increased senna-s to 2 tab bid.   -improved  -encouraged fruits/vegetables/water 13 Depression: Mood appears stable at present  -on Zoloft daily and on Klonopin at bedtime.  14. ABLA: on iron supplement.    LOS (Days) 12 A FACE TO FACE EVALUATION WAS PERFORMED  Erick ColaceKIRSTEINS,Loanne Emery E 06/15/2016 7:58 AM

## 2016-06-15 NOTE — Progress Notes (Signed)
Physical Therapy Session Note  Patient Details  Name: [name] Bell MRN: [id number] Date of Birth: 1981-02-21  Today's Date: 06/15/2016 PT Individual Time: [id number] PT Individual Time Calculation (min): 31 min    Skilled Therapeutic Interventions/Progress Updates:    Pt seen for 30 minutes make up time.  Pt received in bed & agreeable to PT, denying c/o pain. Pt able to transfer supine>sitting EOB with supervision/mod I, head of bed raised & bed rails. Pt able to thread & don pants with supervision but PT provided total A for donning socks, shoes & L AFO. Pt able to transfer to standing with supervision and gait training initiated. Pt ambulated 160 ft + 160 ft room<>gym with [facility] & close supervision. Pt demonstrates reciprocal gait pattern but requires cuing for forward gaze. Pt required 2 standing rest breaks during each ambulation trial of 160 ft 2/2 overall fatigue. Educated pt on & pt able to demonstrate pursed lip breathing. At end of session pt left in bed with sitter & RN present.   PT placed all items (shoes, regular socks, AFO, scrub pants, & cane) in designated area outside of pt's room.   Therapy Documentation Precautions:  Precautions Precautions: Fall, Other (comment) Precaution Comments: left sided weakness; sucide precautions Required Braces or Orthoses: Other Brace/Splint Other Brace/Splint: L AFO Restrictions Weight Bearing Restrictions: Yes RUE Weight Bearing: Weight bearing as tolerated LUE Weight Bearing: Weight bearing as tolerated RLE Weight Bearing: Weight bearing as tolerated LLE Weight Bearing: Weight bearing as tolerated Other Position/Activity Restrictions: WBAT for gait training beginning 04/30/16  Pain: Pain Assessment Pain Assessment: No/denies pain   See Function Navigator for Current Functional Status.   Therapy/Group: Individual Therapy  Sandi MariscalVictoria M Tymere Depuy 06/15/2016, 5:28 PM

## 2016-06-16 ENCOUNTER — Inpatient Hospital Stay (HOSPITAL_COMMUNITY): Payer: Medicaid Other | Admitting: Physical Therapy

## 2016-06-16 ENCOUNTER — Inpatient Hospital Stay (HOSPITAL_COMMUNITY): Payer: Medicaid Other | Admitting: Occupational Therapy

## 2016-06-16 NOTE — Progress Notes (Signed)
Recreational Therapy Discharge Summary Patient Details  Name: [name] Bell MRN: [id number] Date of Birth: Oct 10, 1981 Today's Date: 06/16/2016  Long term goals set: 1  Long term goals met: 1  Comments on progress toward goals: Pt met supervision level for TR tasks given extra time & frequent rest breaks for safe task completion.  Pt is expected to discharge to psych hospital per psychiatry recommendations. Reasons for discharge: discharge from hospital  Patient/family agrees with progress made and goals achieved: Yes  [name] 06/16/2016, 12:48 PM

## 2016-06-16 NOTE — Progress Notes (Signed)
[place] PHYSICAL MEDICINE & REHABILITATION     PROGRESS NOTE    Subjective/Complaint: Patient is modified independent with quad cane for ambulation. Showers with supervision. She appears bright and in good spirits. Making jokes. Was able to do "Cupid shuffle" and only needed to touch her cane twice yesterday  ROS: Denies CP, SOB, N/V/D.    Objective: Vital Signs: Blood pressure (!) 151/94, pulse 72, temperature 98.9 F (37.2 C), temperature source Oral, resp. rate 18, height _0  (1.803 m), weight 81.4 kg (179 lb 6.4 oz), SpO2 100 %. No results found.  Recent Labs  06/14/16 1502  WBC 8.5  HGB 10.8*  HCT 34.0*  PLT 239    Recent Labs  06/14/16 1502  NA 140  K 4.1  CL 110  GLUCOSE 119*  BUN 14  CREATININE 0.72  CALCIUM 9.7   CBG (last 3)  No results for input(s): GLUCAP in the last 72 hours.  Wt Readings from Last 3 Encounters:  06/16/16 81.4 kg (179 lb 6.4 oz)  06/03/16 79.5 kg (175 lb 3.2 oz)  05/20/16 81.6 kg (180 lb)    Physical Exam:  Constitutional: She appears well-developedand well-nourished.  HENT:  Normocephalicand atraumatic.  Eyes: Conjunctivaeand EOM are normal.  Cardiovascular: Regular rate and rhythm.  Respiratory: Effort normaland breath sounds normal. No stridor. No respiratory distress. She has no wheezes.  Right lateral chest incision has healed well.  GI: Soft. Bowel sounds are normal. She exhibits no distension. There is no tenderness.  Midline incision healing with granulation tissue. Musculoskeletal: No edema, no tenderness. Neurological: She is alertand oriented.  Speech clear. Able to follow commands without difficulty.  Sensation intact in the left lower extremity Deep tendon reflexes 3+ left patellar, 2+ right patellar Motor strength: 4+/5 in the right deltoid, biceps, triceps, grip, RLE: 5/5  hip flexor, knee extensor, ankle dorsiflexor. LUE: 4/5 left deltoid, biceps, triceps, grip LLE; 2+/5, hip flexion, knee  extension, 1/5 at the ankle dorsiflexor and plantar flexor. No facial droop Skin: Skin is warmand dry. No rashnoted. No erythema.  Psychiatric: Her behavior is normal. Thought contentnormal.  Pleasant, cooperative   Assessment/Plan: 1. Left hemiparesis and functional/cognitive deficits secondary to right MCA infarct/TBI/polytrauma which require 3+ hours per day of interdisciplinary therapy in a comprehensive inpatient rehab setting. Physiatrist is providing close team supervision and 24 hour management of active medical problems listed below. Physiatrist and rehab team continue to assess barriers to discharge/monitor patient progress toward functional and medical goals.  Function:  Bathing Bathing position Bathing activity did not occur: N/A Position: Wheelchair/chair at sink  Bathing parts Body parts bathed by patient: Right arm, Left arm, Chest, Abdomen, Right upper leg, Left upper leg, Right lower leg, Left lower leg, Front perineal area, Buttocks Body parts bathed by helper: Back  Bathing assist Assist Level: Touching or steadying assistance(Pt > 75%)   Set up : To obtain items  Upper Body Dressing/Undressing Upper body dressing   What is the patient wearing?: [place] over shirt/dress - Perfomed by patient: Thread/unthread right sleeve, Thread/unthread left sleeve, Put head through opening, Pull shirt over trunk Pull over shirt/dress - Perfomed by helper: Thread/unthread right sleeve, Thread/unthread left sleeve, Pull shirt over trunk        Upper body assist Assist Level: Set up   Set up : To obtain clothing/put away  Lower Body Dressing/Undressing Lower body dressing   What is the patient wearing?: Non-skid slipper socks, [facility], [place] -  Performed by patient: Thread/unthread right underwear leg, Thread/unthread left underwear leg Underwear - Performed by helper: Pull underwear up/down Pants- Performed by patient: Thread/unthread right  pants leg, Thread/unthread left pants leg, Pull pants up/down Pants- Performed by helper: Thread/unthread left pants leg Non-skid slipper socks- Performed by patient: Don/doff right sock, Don/doff left sock Non-skid slipper socks- Performed by helper: Don/doff right sock, Don/doff left sock     Shoes - Performed by patient: Don/doff right shoe, Don/doff left shoe, Fasten right, Fasten left Shoes - Performed by helper: Don/doff left shoe, Don/doff right shoe, Fasten right, Fasten left (to slide over heel)          Lower body assist Assist for lower body dressing: Supervision or verbal cues      Toileting Toileting Toileting activity did not occur: No continent bowel/bladder event Toileting steps completed by patient: Adjust clothing prior to toileting, Performs perineal hygiene, Adjust clothing after toileting Toileting steps completed by helper: Adjust clothing prior to toileting, Performs perineal hygiene, Adjust clothing after toileting Toileting Assistive Devices: Grab bar or rail  Toileting assist Assist level: Supervision or verbal cues   Transfers Chair/bed transfer   Chair/bed transfer method: Stand pivot Chair/bed transfer assist level: Supervision or verbal cues Chair/bed transfer assistive device: Armrests, Cane Mechanical lift: Ecologist     Max distance: 160 ft Assist level: Supervision or verbal cues   Wheelchair   Type: Manual Max wheelchair distance: 160 ft Assist Level: Supervision or verbal cues  Cognition Comprehension Comprehension assist level: Follows complex conversation/direction with no assist  Expression Expression assist level: Expresses complex ideas: With no assist  Social Interaction Social Interaction assist level: Interacts appropriately with others with medication or extra time (anti-anxiety, antidepressant).  Problem Solving Problem solving assist level: Solves complex problems: Recognizes & self-corrects  Memory Memory  assist level: More than reasonable amount of time   Medical Problem List and Plan: 1. Left  hemiparesis and cognitive deficits secondary to right MCA infarct and traumatic brain injury, also left sacroiliac fracture status post fusion as well as bilateral superior and inferior pubic ramus fracture with Left HF weakness, weakness may be related to MCA infarct, given she has hyperreflexia in the left lower extremity  -Cont CIR-Team conference today please see physician documentation under team conference tab, met with team face-to-face to discuss problems,progress, and goals. Formulized individual treatment plan based on medical history, underlying problem and comorbidities.  -improved standing balance 2. DVT Prophylaxis/Anticoagulation:   Lovenox indicated  -dopplers negative 3. Pain Management: tylenol prn 4. Mood: Patient has denied any anxiety, depressive symptoms, insomnia, nightmares or intent for self harm.   -psychiatry continues to recommend inpatient commitment to psych hospital. Will discharge from rehab to inpatient psych facility, Psychiatry f/u planned for today  -appreciate neuropysch input  -sitter continues 5. Neuropsych: This patient is not fullycapable of making decisions on herown behalf. 6. Skin/Wound Care: continue wet-dry dressing abd  7. Fluids/Electrolytes/Nutrition: Monitor by mouth intake 8. Abdominal procedures with bowel resection:  -Continue dressing changes 9. TBI/R-MCA infarct with DAI with hemorrhage: ASA added 10 New onset seizures: Controlled on vimpat bid. 11. Empyema RLL s/p VATS: Resolved. Respiratory status stable.  12. Constipation: improved with  Metamucil and increased senna-s to 2 tab bid.   -improved  -encouraged fruits/vegetables/water 13 Depression: Mood appears stable at present  -on Zoloft daily and on Klonopin at bedtime.  14. ABLA: on iron supplement.    LOS (Days) 13 A FACE TO FACE EVALUATION WAS  PERFORMED  [name] Bell 06/16/2016 9:37 AM

## 2016-06-16 NOTE — Patient Care Conference (Signed)
Inpatient RehabilitationTeam Conference and Plan of Care Update Date: 06/16/2016   Time: 11:40 AM    Patient Name: [name] Bell      Medical Record Number: [id number]  Date of Birth: [birth year] Sex: Female         Room/Bed: 4W11C/4W11C-01 Payor Info: Payor: MEDICAID [place] / Plan: MEDICAID [place] / Product Type: *No Product type* /    Admitting Diagnosis: Suicide Attempt Poly Trauma  Admit Date/Time:  06/03/2016  5:22 PM Admission Comments: No comment available   Primary Diagnosis:  Traumatic brain injury with loss of consciousness of 1 hour to 5 hours 59 minutes ([place]) Principal Problem: Traumatic brain injury with loss of consciousness of 1 hour to 5 hours 59 minutes [facility])  Patient Active Problem List   Diagnosis Date Noted  . Hx of suicide attempt   . Traumatic brain injury with loss of consciousness of 1 hour to 5 hours 59 minutes ([place]) 06/03/2016  . Left hemiparesis ([place])   . S/P thoracotomy 05/14/2016  . Cerebral embolism with cerebral infarction 05/11/2016  . Major depressive disorder, recurrent severe without psychotic features ([place]) 04/12/2016  . Multiple pelvic fractures ([place]) 03/03/2016  . Left wrist fracture 03/03/2016  . Multiple fractures of ribs of right side 03/03/2016  . Fracture of multiple ribs of left side 03/03/2016  . Liver laceration 03/03/2016  . Small intestine injury 03/03/2016  . Acute blood loss anemia 03/03/2016  . Suicide attempt ([place]) 03/03/2016  . Bilateral pulmonary contusion 03/03/2016  . Acute respiratory failure ([place]) 03/03/2016  . Hypokalemia 03/03/2016  . Hypernatremia 03/03/2016  . Acute kidney injury ([place]) 03/03/2016  . Ingestion of caustic substance 03/03/2016  . Tylenol overdose 03/03/2016  . Hyperglycemia 03/03/2016  . Hepatic failure ([place]) 03/03/2016  . Pressure ulcer 02/26/2016  . Fall from, out of or through building, not otherwise specified, initial encounter 02/09/2016  . Hypovolemic shock ([place]) 02/09/2016    Expected Discharge  Date: Expected Discharge Date: 06/17/16  Team Members Present: Physician leading conference: Dr. Alysia Penna Social Worker Present: Lennart Pall, LCSW Nurse Present: Other (comment) Marlowe Aschoff, RN) PT Present: Carney Living, PT OT Present: Willeen [name], Dorothyann Gibbs, OT SLP Present: Weston Anna, SLP PPS Coordinator present : Daiva Nakayama, RN, CRRN     Current Status/Progress Goal Weekly Team Focus  Medical   Left hemiparesis with improvements in the left arm but still has toe drag on the left leg, now requiring cane rather than walker. Abdominal wound granulating  Improve functional mobility and transition to inpatient psychiatric  Discharge planning, optimizing assisted device including orthoses and mobility aids   Bowel/Bladder   continent of bowel & bladder, LBM 06/15/16  Continent of Bowel & Bladder  Continue to monitor, encourage & assist with toileting   Swallow/Nutrition/ Hydration             ADL's   overall S, goals met  Supervision/setup  Inpatient OT completed for basic ADL retraining, will benefit from continued OT in next setting for advancement of IADLs and balance   Mobility   supervision  supervision overall, min assist stairs  functional mobility training, L NMR, standing balance, activity tolerance, pt/family education   Communication             Safety/Cognition/ Behavioral Observations            Pain   No c/o pain since 06/11/16. Has no prns or scheduled pain meds.   pain scale less than 3  continue to assess &  treat as needed   Skin   abdominal incision healing well, patient performing drsg changes with bacitracin ointment & dry dsg  No new skin breakdown  continue to assess skin q shift    Rehab Goals Patient on target to meet rehab goals: Yes *See Care Plan and progress notes for long and short-term goals.  Barriers to Discharge: History of recent suicide attempt with ongoing risk determined by psychiatry    Possible Resolutions to  Barriers:  Continue psychology and psychiatry involvement    Discharge Planning/Teaching Needs:  Exact d/c location still under consideration:  either to inpatient psychiatric facility or home with sister (who can provide 24/7 supervision)      Team Discussion:  Pt on track to reach distant supervision goals (supervision recommended purely due to suicide precautions - pt physically at mod independence).  SW reports awaiting re-consult from Psychiatry to determine d/c disposition.    Revisions to Treatment Plan:  None   Continued Need for Acute Rehabilitation Level of Care: The patient requires daily medical management by a physician with specialized training in physical medicine and rehabilitation for the following conditions: Daily direction of a multidisciplinary physical rehabilitation program to ensure safe treatment while eliciting the highest outcome that is of practical value to the patient.: Yes Daily medical management of patient stability for increased activity during participation in an intensive rehabilitation regime.: Yes Daily analysis of laboratory values and/or radiology reports with any subsequent need for medication adjustment of medical intervention for : Neurological problems;Mood/behavior problems  [place] Catarino 06/16/2016, 2:26 PM

## 2016-06-16 NOTE — Progress Notes (Signed)
Occupational Therapy Session Note  Patient Details  Name: [name] Bell MRN: [id number] Date of Birth: Jul 09, 1981  Today's Date: 06/16/2016 OT Individual Time: 1324-4010 OT Individual Time Calculation (min): 60 min     Short Term Goals:Week 1:  OT Short Term Goal 1 (Week 1): Pt will transfer to toilet/ BSC with minA with LRAD OT Short Term Goal 1 - Progress (Week 1): Met OT Short Term Goal 2 (Week 1): Pt will don LB clothing sit to stand with min A with AE prn  OT Short Term Goal 2 - Progress (Week 1): Met OT Short Term Goal 3 (Week 1): Pt will perform 2 grooming tasks in standing at sink wiht min A OT Short Term Goal 3 - Progress (Week 1): Met OT Short Term Goal 4 (Week 1): Pt will demonstrate emergent awareness in ADL with min questioning cues OT Short Term Goal 4 - Progress (Week 1): Met Week 2:  OT Short Term Goal 1 (Week 2): LTG=STG overall supervision  Skilled Therapeutic Interventions/Progress Updates: Pt seen for skilled OT to facilitate functional mobility with quad cane with ADL retraining of shower and dressing.  Pt sitting EOB with sitter in room crying about discharge plan stating she is full of apprehension about next venue of care.  Discussed positive aspects of plan and that she will eventually go home to sister's house.  Pt calmed down and ambulated with S to shower, in shower bathed with S.  Pt was overall S with all self care and mobility with quad cane. Pt had good activity tolerance in that she did not need any rest breaks and was able to stand without fatigue.  Pt in room at end of session with sitter.      Therapy Documentation Precautions:  Precautions Precautions: Fall, Other (comment) Precaution Comments: left sided weakness; sucide precautions Required Braces or Orthoses: Other Brace/Splint Other Brace/Splint: L AFO Restrictions Weight Bearing Restrictions: Yes RUE Weight Bearing: Weight bearing as tolerated LUE Weight Bearing: Weight bearing as  tolerated RLE Weight Bearing: Weight bearing as tolerated LLE Weight Bearing: Weight bearing as tolerated Other Position/Activity Restrictions: WBAT for gait training beginning 04/30/16    Vital Signs: Therapy Vitals Temp: 98.9 F (37.2 C) Temp Source: Oral Pulse Rate: 72 Resp: 18 BP: (!) 151/94 Patient Position (if appropriate): Lying Oxygen Therapy SpO2: 100 % O2 Device: Not Delivered   Pain: no c/o pain   ADL: ADL ADL Comments: see functional navigator  See Function Navigator for Current Functional Status.   Therapy/Group: Individual Therapy  [place] 06/16/2016, 9:00 AM

## 2016-06-16 NOTE — Progress Notes (Signed)
Social Work Patient ID: [name] Bell, female   DOB: 29-Sep-1981, 35 y.o.   MRN: [id number]   Pt and PT returning from gym and report that Dr. Elsie SaasJonnalagadda came by and cleared pt for d/c home tomorrow.  (Note not yet in chart).  Pt very pleased with this decision and has already informed her sister.  I have also spoken with sister who reports she is also pleased with decision and prepared to provide recommended 24/7 supervision for pt at her home. Sister states, "I've had a long talk with her about what I expect her to work on.  I feel good about this. I'm always here." Have discussed with both the expected follow up in terms of rehab (OPPT) but still working on plan for mental health follow up (pt has only Medicaid).    Mays Paino, LCSW

## 2016-06-16 NOTE — Progress Notes (Signed)
Occupational Therapy Discharge Summary  Patient Details  Name: [name] Bell MRN: [id number] Date of Birth: Aug 14, 1981    Patient has met 30 of 11 long term goals due to improved activity tolerance, improved balance, postural control, ability to compensate for deficits, functional use of  LEFT upper and LEFT lower extremity, improved attention, improved awareness and improved coordination. She has made strong gains as she required mod A on admission and is now S. She now has functional activity tolerance to complete ADLs in a time efficient manner without rest breaks.  Patient to discharge at overall Supervision level.  Pt continues to require supervision due to suicide precautions, otherwise she could complete self care at a mod I level. Pt will be going home with supervision from her sister who is able to provide any necessary physical assist.  Reasons goals not met: n/a  Recommendation:  No further OT services recommended at this time.  Equipment: No equipment provided. Pt may benefit from a shower chair which family will obtain if needed.  Reasons for discharge: treatment goals met  Patient/family agrees with progress made and goals achieved: Yes  OT Discharge Precautions/Restrictions  Precautions Precautions: Fall;Other (comment) Precaution Comments: left sided weakness; sucide precautions Required Braces or Orthoses: Other Brace/Splint Other Brace/Splint: L AFO    ADL ADL ADL Comments: Supervision with basic ADLs Vision/Perception  Vision- History Baseline Vision/History: Wears glasses Patient Visual Report: No change from baseline Vision- Assessment Vision Assessment?: No apparent visual deficits  Cognition Overall Cognitive Status: Within Functional Limits for tasks assessed Sensation Sensation Light Touch: Impaired Detail Light Touch Impaired Details: Impaired LLE Stereognosis: Appears Intact Hot/Cold: Appears Intact Proprioception: Impaired  Detail Proprioception Impaired Details: Impaired LLE Coordination Gross Motor Movements are Fluid and Coordinated: No Fine Motor Movements are Fluid and Coordinated: Yes Finger Nose Finger Test: [facility] Motor  Motor Motor: Hemiplegia;Abnormal postural alignment and control Motor - Discharge Observations: improved in LUE and LLE, continues to have decreased ankle/toe AROM Mobility    Supervision with quad cane Trunk/Postural Assessment  Cervical Assessment Cervical Assessment: Within Functional Limits Thoracic Assessment Thoracic Assessment: Within Functional Limits Lumbar Assessment Lumbar Assessment: Within Functional Limits  Balance Static Sitting Balance Static Sitting - Level of Assistance: 7: Independent Dynamic Sitting Balance Dynamic Sitting - Level of Assistance: 7: Independent Static Standing Balance Static Standing - Level of Assistance: 5: Stand by assistance Dynamic Standing Balance Dynamic Standing - Level of Assistance: 5: Stand by assistance Extremity/Trunk Assessment RUE Assessment RUE Assessment: Within Functional Limits LUE Assessment LUE Assessment: Within Functional Limits (4+/5)   See Function Navigator for Current Functional Status.  [place] 06/16/2016, 9:46 AM

## 2016-06-16 NOTE — Progress Notes (Signed)
Social Work Patient ID: [name], female   DOB: 19-Dec-1980, 35 y.o.   MRN: [id number]   Have reviewed team conference info with pt.  She is aware that CIR goals are being reached and she will end therapies tomorrow.  She is also aware that we are awaiting re-consult from Dr. Juluis RainierJonalagoda to determine d/c disposition and timing of d/c.  Rex Magee, LCSW

## 2016-06-16 NOTE — Progress Notes (Signed)
Physical Therapy Discharge Summary  Patient Details  Name: [name] Bell MRN: [id number] Date of Birth: 05/14/1981  Today's Date: 06/16/2016 PT Individual Time: 8366-2947 and 1330-1505 PT Individual Time Calculation (min): 70 min and 95 min    Patient has met 7 of 7 long term goals due to improved activity tolerance, improved balance, improved postural control, increased strength, increased range of motion, decreased pain, ability to compensate for deficits, functional use of  left upper extremity and left lower extremity and improved coordination.  Patient to discharge at an ambulatory level Supervision.   Patient's care partner is independent to provide the necessary physical assistance at discharge.  Reasons goals not met: NA  Recommendation:  Patient will benefit from ongoing skilled PT services in outpatient setting to continue to advance safe functional mobility, address ongoing impairments in LLE strength, standing balance, coordination, activity tolerance, and minimize fall risk.  Equipment: [facility]  Reasons for discharge: treatment goals met and discharge from hospital  Patient/family agrees with progress made and goals achieved: Yes  Skilled Therapeutic Intervention Treatment 1: Session focused on donning socks, shoes, and L AFO sitting in arm chair with setup and more than reasonable time, functional ambulation multiple trials on rehab unit up to 200 ft using [facility] and self-correcting for forward gaze without cues, stair training up/down 12 stairs using 2 rails with step-to pattern on 6" steps and reciprocal pattern on 3" steps, up/down curb step using WBQC, up/down ramp and across uneven wood chip surface using WBQC, simulated car transfer to sedan height using WBQC, and bed mobility on regular bed in ADL apartment to R and increased time with cues for technique. Patient required supervision for all functional mobility. Patient left in bathroom with NT present.   Treatment 2:  Patient in bed upon arrival, required verbal cues and increased time to awaken and sit edge of bed. Patient c/o increased stiffness and soreness after being in bed. Patient ambulated to bathroom using [facility] with gripper socks and increased time with supervision, performed toilet transfer and toileting tasks with mod I. After completing hand hygiene, patient donned socks, shoes, and AFO seated EOB with increased time. Gait training using WBQC x 100 ft + 60 ft + 160 ft with supervision and increased time due to stiffness/soreness. Patient demonstrates high fall risk as noted by score of 37/56 on Berg Balance Scale. Patient instructed in HEP for LE strengthening and neuro re-ed and provided handout. Patient left in room with sitter present.   PT Discharge Precautions/RestrictionsPrecautions Precautions: Fall;Other (comment) Precaution Comments: sucide precautions Required Braces or Orthoses: Other Brace/Splint Other Brace/Splint: L AFO Restrictions Weight Bearing Restrictions: Yes RUE Weight Bearing: Weight bearing as tolerated LUE Weight Bearing: Weight bearing as tolerated RLE Weight Bearing: Weight bearing as tolerated LLE Weight Bearing: Weight bearing as tolerated Pain Pain Assessment Pain Assessment: No/denies pain Vision/Perception   No change from baseline  Cognition Overall Cognitive Status: Within Functional Limits for tasks assessed Orientation Level: Oriented X4 Sensation Sensation Light Touch: Impaired Detail Light Touch Impaired Details: Impaired LLE Stereognosis: Appears Intact Hot/Cold: Appears Intact Proprioception: Appears Intact Proprioception Impaired Details: Impaired LLE Additional Comments: slightly impaired sensation to LT in L foot Coordination Gross Motor Movements are Fluid and Coordinated: No Fine Motor Movements are Fluid and Coordinated: Yes Coordination and Movement Description: LLE weakness Finger Nose Finger Test: [facility] Motor  Motor Motor:  Hemiplegia;Abnormal postural alignment and control Motor - Discharge Observations: improved in LUE and LLE, continues to have decreased ankle/toe AROM  Mobility  Bed Mobility Bed Mobility: Supine to Sit;Sit to Supine Supine to Sit: 6: Modified independent (Device/Increase time) Sit to Supine: 6: Modified independent (Device/Increase time) Transfers Sit to Stand: 5: Supervision Stand to Sit: 5: Supervision Locomotion  Ambulation Ambulation: Yes Ambulation/Gait Assistance: 5: Supervision Ambulation Distance (Feet): 160 Feet Assistive device: Large base quad cane Gait Gait: Yes Gait Pattern: Impaired Gait Pattern: Step-through pattern;Decreased step length - right;Decreased stance time - left;Decreased dorsiflexion - left;Decreased weight shift to left;Left steppage;Trunk flexed Gait velocity: 10 MWT = 0.53 m/s  Stairs / Additional Locomotion Stairs: Yes Stairs Assistance: 5: Supervision Stair Management Technique: Two rails;Forwards;Step to pattern Number of Stairs: 12 Height of Stairs: 3 (8 3", 4 6") Ramp: 5: Supervision Curb: 5: Supervision Wheelchair Mobility Wheelchair Mobility: No  Trunk/Postural Assessment  Cervical Assessment Cervical Assessment: Within Functional Limits Thoracic Assessment Thoracic Assessment: Within Functional Limits Lumbar Assessment Lumbar Assessment: Within Functional Limits Postural Control Postural Control: Deficits on evaluation (delayed)  Balance Balance Balance Assessed: Yes Standardized Balance Assessment Standardized Balance Assessment: Berg Balance Test Berg Balance Test Sit to Stand: Able to stand  independently using hands Standing Unsupported: Able to stand safely 2 minutes Sitting with Back Unsupported but Feet Supported on Floor or Stool: Able to sit safely and securely 2 minutes Stand to Sit: Sits safely with minimal use of hands Transfers: Able to transfer safely, definite need of hands Standing Unsupported with Eyes Closed:  Able to stand 10 seconds with supervision Standing Ubsupported with Feet Together: Able to place feet together independently and stand for 1 minute with supervision From Standing, Reach Forward with Outstretched Arm: Can reach forward >12 cm safely (5") From Standing Position, Pick up Object from Floor: Able to pick up shoe, needs supervision From Standing Position, Turn to Look Behind Over each Shoulder: Looks behind one side only/other side shows less weight shift (Pain in R side) Turn 360 Degrees: Needs close supervision or verbal cueing Standing Unsupported, Alternately Place Feet on Step/Stool: Able to complete >2 steps/needs minimal assist Standing Unsupported, One Foot in Front: Needs help to step but can hold 15 seconds Standing on One Leg: Tries to lift leg/unable to hold 3 seconds but remains standing independently Total Score: 37 Static Standing Balance Static Standing - Level of Assistance: 6: Modified independent (Device/Increase time) Dynamic Standing Balance Dynamic Standing - Level of Assistance: 5: Stand by assistance Extremity Assessment  RUE Assessment RUE Assessment: Within Functional Limits LUE Assessment LUE Assessment: Within Functional Limits (4+/5) RLE Assessment RLE Assessment: Within Functional Limits LLE Assessment LLE Assessment: Exceptions to [facility] LLE Strength LLE Overall Strength: Deficits LLE Overall Strength Comments: grossly 3-/5 throughout   See Function Navigator for Current Functional Status.  Carney Living A 06/16/2016, 11:40 AM

## 2016-06-17 MED ORDER — FERROUS GLUCONATE 324 (38 FE) MG PO TABS: 324.0000 mg | ORAL_TABLET | Freq: Two times a day (BID) | ORAL | 0 refills | Status: AC

## 2016-06-17 MED ORDER — LACOSAMIDE 100 MG PO TABS: 100.0000 mg | ORAL_TABLET | Freq: Two times a day (BID) | ORAL | 0 refills | Status: DC

## 2016-06-17 MED ORDER — SENNOSIDES-DOCUSATE SODIUM 8.6-50 MG PO TABS: 2.0000 | ORAL_TABLET | Freq: Two times a day (BID) | ORAL | 0 refills | Status: DC

## 2016-06-17 MED ORDER — ATORVASTATIN CALCIUM 20 MG PO TABS: 20.0000 mg | ORAL_TABLET | Freq: Every day | ORAL | 0 refills | Status: DC

## 2016-06-17 MED ORDER — FERROUS GLUCONATE 324 (38 FE) MG PO TABS: 324.0000 mg | ORAL_TABLET | Freq: Two times a day (BID) | ORAL | 0 refills | Status: DC

## 2016-06-17 MED ORDER — PSYLLIUM 95 % PO PACK: 1.0000 | PACK | Freq: Every day | ORAL | Status: DC | PRN

## 2016-06-17 MED ORDER — BACITRACIN ZINC 500 UNIT/GM EX OINT: TOPICAL_OINTMENT | Freq: Every day | CUTANEOUS | 0 refills | Status: DC

## 2016-06-17 MED ORDER — SERTRALINE HCL 100 MG PO TABS: 100.0000 mg | ORAL_TABLET | Freq: Every day | ORAL | 0 refills | Status: DC

## 2016-06-17 MED ORDER — ASPIRIN 81 MG PO TBEC: 81.0000 mg | DELAYED_RELEASE_TABLET | Freq: Every day | ORAL | 0 refills | Status: AC

## 2016-06-17 MED ORDER — CLONAZEPAM 0.5 MG PO TABS: 0.5000 mg | ORAL_TABLET | Freq: Every day | ORAL | 0 refills | Status: DC

## 2016-06-17 MED ORDER — METHOCARBAMOL 500 MG PO TABS: 500.0000 mg | ORAL_TABLET | Freq: Two times a day (BID) | ORAL | 0 refills | Status: DC | PRN

## 2016-06-17 MED ORDER — ADULT MULTIVITAMIN W/MINERALS CH: 1.0000 | ORAL_TABLET | Freq: Every day | ORAL | 0 refills | Status: DC

## 2016-06-17 NOTE — Discharge Instructions (Signed)
Inpatient Rehab Discharge Instructions  [name] Bell Discharge date and time:  06/17/16  ActivitTARAJI MUNGOions/ Functional Status: Activity: No lifting, driving, or strenuous exercise till cleared by MD.  Diet: regular diet Wound Care: Wash with soap and water. Keep dry. Cover with small amount of bacitracin till central area heals up and cover with dry dressing. Contact MD if you develop any problems with your incision/wound--redness, swelling, increase in pain, drainage or if you develop fever or chills.    Functional status:  ___ No restrictions     ___ Walk up steps independently _X__ 24/7 supervision/assistance   ___ Walk up steps with assistance ___ Intermittent supervision/assistance  _X__ Bathe/dress independently _X__ Walk with walker     ___ Bathe/dress with assistance ___ Walk Independently    ___ Shower independently ___ Walk with assistance    ___ Shower with assistance _X__ No alcohol     ___ Return to work/school ________    COMMUNITY REFERRALS UPON DISCHARGE:    Outpatient: PT                  Agency:  [facility]   Phone: [phone]               Appointment Date/Time: 8/17 @ 3:30 pm (arrive @ 3:00)  Medical Equipment/Items Ordered:  Quad cane                                                       Agency/Supplier:  [facility] @ [phone]   GENERAL COMMUNITY RESOURCES FOR PATIENT/FAMILY:  Support Groups: Several available via [facility] (see handout)    Mental Health:              Referrals made to [facility] @ [phone] for follow up counseling and crisis support team.                                                                     [facility] for care coordination support (if additional resources are needed)                                                                      [facility] YOU MUST COMPLETE A WALK-IN APPOINTMENT ANYTIME 8-12 MON-FRI  for medication management services  @                                                                                                        @  [address] [place]      Special Instructions: 1. No driving for a year. 2. No swimming/hot tubs, alcohol, strobe lights or tub baths. Avoid high altitudes. Take medications as prescribed.    My questions have been answered and I understand these instructions. I will adhere to these goals and the provided educational materials after my discharge from the hospital.  Patient/Caregiver Signature _______________________________ Date __________  Clinician Signature _______________________________________ Date __________  Please bring this form and your medication list with you to all your follow-up doctor's appointments.

## 2016-06-17 NOTE — Progress Notes (Signed)
Patient discharged home.  Left floor via wheelchair, escorted by nursing staff and family.  Patient and sister verbalized understanding of discharge instructions as given by Marissa NestlePam Love, PA.  All patient belongings sent with patient, including DME.  Appears to be in no immediate distress at this time.  Dani Gobbleeardon, Mija Effertz J, RN

## 2016-06-17 NOTE — Progress Notes (Signed)
Patient has cell phone and charger in the room. Sitter stated she has modifications that she can have these items in the room.
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
## 2016-06-17 NOTE — Progress Notes (Signed)
Social Work  Discharge Note  The overall goal for the admission was met for:   Discharge location: Yes - dc home with sister and family providing 24/7 supervision   Length of Stay: Yes - 14 days  Discharge activity Bell: Yes - supervision  Home/community participation: Yes  Services provided included: MD, RD, PT, OT, SLP, RN, TR, Pharmacy, Neuropsych and SW and Psychiatry  Financial Services: Medicaid  Follow-up services arranged: Outpatient: PT via [facility],  DME:  [facility] ordered via [facility];  Other: Referred pt to [facility] for mental health follow up;  referred to [facility] for mental health care coordination;  referred to [facility] for medication management;  provided list of support groups available via local [place]. and Patient/Family has no preference for HH/DME agencies  Comments (or additional information):  Placing referral to [facility], however, could not make appointment yet and they requested I call office on Mon 8/14 - pt and sister aware that I will follow up with them next week with appt info.  Patient/Family verbalized understanding of follow-up arrangements: Yes  Individual responsible for coordination of the follow-up plan: pt  Confirmed correct DME delivered: [name] Bell 06/17/2016    [name] Bell

## 2016-06-18 ENCOUNTER — Other Ambulatory Visit: Payer: Self-pay | Admitting: Neurology

## 2016-06-18 ENCOUNTER — Encounter: Payer: Self-pay | Admitting: Family Medicine

## 2016-06-18 ENCOUNTER — Telehealth: Payer: Self-pay | Admitting: Neurology

## 2016-06-18 ENCOUNTER — Ambulatory Visit: Payer: Medicaid Other | Attending: Family Medicine | Admitting: Family Medicine

## 2016-06-18 VITALS — BP 118/80 | HR 70 | Temp 98.3°F | Ht 74.0 in | Wt 192.0 lb

## 2016-06-18 DIAGNOSIS — F332 Major depressive disorder, recurrent severe without psychotic features: Secondary | ICD-10-CM | POA: Diagnosis not present

## 2016-06-18 DIAGNOSIS — S2241XA Multiple fractures of ribs, right side, initial encounter for closed fracture: Secondary | ICD-10-CM

## 2016-06-18 DIAGNOSIS — G8194 Hemiplegia, unspecified affecting left nondominant side: Secondary | ICD-10-CM

## 2016-06-18 DIAGNOSIS — S36409A Unspecified injury of unspecified part of small intestine, initial encounter: Secondary | ICD-10-CM

## 2016-06-18 DIAGNOSIS — R569 Unspecified convulsions: Secondary | ICD-10-CM | POA: Insufficient documentation

## 2016-06-18 DIAGNOSIS — R561 Post traumatic seizures: Secondary | ICD-10-CM

## 2016-06-18 MED ORDER — LEVETIRACETAM 500 MG PO TABS: 500.0000 mg | ORAL_TABLET | Freq: Two times a day (BID) | ORAL | 2 refills | Status: DC

## 2016-06-18 NOTE — Telephone Encounter (Signed)
Message sent to Dr .Roda ShuttersXu about vimpat. Pt was just discharge from hospital. The vimpat was prescribed by Md at hospital. Rn sent message to Dr. Roda ShuttersXu. PTs  Family member would like on her on keppra if can be.

## 2016-06-18 NOTE — Progress Notes (Signed)
Fatigue Wound dressing- abdominal Needs seizure medication- not preauth yet

## 2016-06-18 NOTE — Telephone Encounter (Signed)
Pt's sister called asking for PA for Vimpat. Pt was admitted to hospital in April and was d/c'd yesterday. Pt has been without medication for 2 days now. Please call and advise

## 2016-06-18 NOTE — Telephone Encounter (Signed)
Talked with Doristine Mangolement from [facility] and pt needs PA for vimpat although she is on Medicaid. Not able to approve over the weekend. Will change to keppra for continued coverage. Will d/c vimpat.   I have ordered keppra to the pharmacy.    Marvel PlanJindong Lestat Golob, MD PhD Stroke Neurology 06/18/2016 1:37 PM

## 2016-06-18 NOTE — Telephone Encounter (Signed)
[facility] [phone] called sts medication needs PA. Could she have keppra to last her over the weekend.

## 2016-06-18 NOTE — Progress Notes (Signed)
Subjective:  Patient ID: [name] Bell, female    DOB: Aug 19, 1981  Age: 35 y.o. MRN: [id number]  CC: follow up hospilization   HPI [name] Bell is to 35 year old female who is here for follow-up from hospitalization from 02/09/16 through 06/17/16 after hospitalization for traumatic brain injury with loss of consciousness secondary to suicide attempt in which she had jumped off the fifth floor of a hotel room after drinking a poisonous substance.  She sustained multiple rib and pelvic fractures and underwent pinning; did have abdominal  lacerations with subsequent bowel resection and wound VAC closure by secondary intention. Also underwent chest tube insertion which was later decreased, PEG tube placement which was later removed. She developed new onset seizure and was commenced on Vimpat; paced on antidepressants. Subsequently transferred to comprehensive inpatient rehabilitation. She was discharged with recommendations to follow-up with rehabilitation medicine, neurosurgery, neurology and psychotherapy.  She presents today accompanied by her sister reports doing well. Performs daily wound and dressing changes herself; ambulates with the aid of a cane. Past Medical History:  Diagnosis Date  . ADD (attention deficit disorder)    has been on medication  . Anemia   . Anxiety   . Depression   . H/O suicide attempt    at age 16.   Marland Kitchen PTSD (post-traumatic stress disorder)     Past Surgical History:  Procedure Laterality Date  . APPLICATION OF WOUND VAC  02/09/2016   Procedure: APPLICATION OF WOUND VAC;  Surgeon: Jimmye Norman, MD;  Location: [facility] OR;  Service: General;;  . APPLICATION OF WOUND VAC N/A 02/16/2016   Procedure: RE-APPLICATION OF WOUND VAC;  Surgeon: [name], MD;  Location: [facility] OR;  Service: General;  Laterality: N/A;  . BOWEL RESECTION  02/09/2016   Procedure: SMALL BOWEL RESECTION, MESENTERIC REPAIR;  Surgeon: Jimmye Norman, MD;  Location: [facility] OR;  Service: General;;  . CHEST  TUBE INSERTION Right 02/11/2016   Procedure: CHEST TUBE INSERTION;  Surgeon: Jimmye Norman, MD;  Location: [facility] OR;  Service: General;  Laterality: Right;  . CHEST TUBE INSERTION Right 02/26/2016   Procedure: CHEST TUBE INSERTION;  Surgeon: Kerin Perna, MD;  Location: [facility] OR;  Service: Thoracic;  Laterality: Right;  . CHOLECYSTECTOMY  02/09/2016   Procedure: CHOLECYSTECTOMY;  Surgeon: Jimmye Norman, MD;  Location[facility] OR;  Service: General;;  . ESOPHAGOGASTRODUODENOSCOPY N/A 02/10/2016   Procedure: ESOPHAGOGASTRODUODENOSCOPY (EGD);  Surgeon: Sherrilyn Rist, MD;  Location: [facility] ENDOSCOPY;  Service: Endoscopy;  Laterality: N/A;  . EXTERNAL FIXATION PELVIS  02/09/2016   Procedure: EXTERNAL FIXATION PELVIS;  Surgeon: Myrene Galas, MD;  Location: [facility] OR;  Service: Orthopedics;;  . HEMATOMA EVACUATION Right 05/14/2016   Procedure: EVACUATION HEMATOMA;  Surgeon: Alleen Borne, MD;  Location: [facility] OR;  Service: Thoracic;  Laterality: Right;  Evacuation of Hematoma Right Chest  . LACERATION REPAIR  02/09/2016   Procedure: REPAIR LIVER LACERATION;  Surgeon: Jimmye Norman, MD;  Location: [facility] OR;  Service: General;;  . LAPAROTOMY N/A 02/10/2016   Procedure: EXPLORATORY LAPAROTOMY, removal of packs,  cauterization of liver, repacking of liver, and open abdomen vac application;  Surgeon: [name], MD;  Location: [facility] OR;  Service: General;  Laterality: N/A;  . LAPAROTOMY N/A 02/11/2016   Procedure: EXPLORATORY LAPAROTOMY VAC CHANGE ;  Surgeon: Jimmye Norman, MD;  Location: [facility] OR;  Service: General;  Laterality: N/A;  . LAPAROTOMY N/A 02/13/2016   Procedure: EXPLORATORY LAPAROTOMY, REMOVAL OF PACKS, ABDOMINAL VAC DRESSING CHANGE;  Surgeon: [name], MD;  Location: [facility] OR;  Service: General;  Laterality: N/A;  . LAPAROTOMY N/A 02/18/2016   Procedure: EXPLORATORY LAPAROTOMY, PLACEMENT OF ABRA ABDOMINAL WALL CLOSURE SET;  Surgeon: Jimmye NormanJames [name], MD;  Location: [facility] OR;  Service: General;  Laterality: N/A;  . LAPAROTOMY N/A 02/09/2016    Procedure: EXPLORATORY LAPAROTOMY;  Surgeon: Jimmye NormanJames [name], MD;  [facility];  Service: General;  Laterality: N/A;  . LAPAROTOMY N/A 02/16/2016   Procedure: EXPLORATORY LAPAROTOMY, ABDOMINAL WASH OUT;  Surgeon: [name] Thompson, MD;  [facility];  Service: General;  Laterality: N/A;  . PERCUTANEOUS TRACHEOSTOMY N/A 03/01/2016   Procedure: PERCUTANEOUS TRACHEOSTOMY;  Surgeon: Jimmye NormanJames [name], MD;  [facility];  Service: General;  Laterality: N/A;  Loyal Gambler. SACRO-ILIAC PINNING Right 02/16/2016   Procedure: Loyal GamblerSACRO-ILIAC PINNING;  Surgeon: Myrene GalasMichael [name], MD;  [facility];  Service: Orthopedics;  Laterality: Right;  . TEE WITHOUT CARDIOVERSION N/A 05/14/2016   Procedure: TRANSESOPHAGEAL ECHOCARDIOGRAM (TEE);  Surgeon: Alleen BorneBryan K Bartle, MD;  [facility];  Service: Thoracic;  Laterality: N/A;  . THORACOTOMY/LOBECTOMY Right 04/26/2016   Procedure: Right THORACOTOMY AND DRAINAGE OF EMPYEMA;  Surgeon: Alleen BorneBryan K Bartle, MD;  Location: [facility] OR;  Service: Thoracic;  Laterality: Right;  Marland Kitchen. VIDEO ASSISTED THORACOSCOPY (VATS)/THOROCOTOMY Right 05/14/2016   Procedure: RIGHT VIDEO ASSISTED THORACOSCOPY (VATS),DRAINAGE OF EMPYEMA;  Surgeon: Alleen BorneBryan K Bartle, MD;  Location: [facility] OR;  Service: Thoracic;  Laterality: Right;  . WOUND DEBRIDEMENT N/A 03/01/2016   Procedure: ABDOMINAL WOUND CLOSURE;  Surgeon: Jimmye NormanJames [name], MD;  [facility];  Service: General;  Laterality: N/A;     Outpatient Medications Prior to Visit  Medication Sig Dispense Refill  . aspirin 81 MG EC tablet Take 1 tablet (81 mg total) by mouth daily. 100 tablet 0  . atorvastatin (LIPITOR) 20 MG tablet Take 1 tablet (20 mg total) by mouth daily at 6 PM. 30 tablet 0  . bacitracin ointment Apply topically daily. To abdominal incision 120 g 0  . clonazePAM (KLONOPIN) 0.5 MG tablet Take 1 tablet (0.5 mg total) by mouth at bedtime. 30 tablet 0  . ferrous gluconate (FERGON) 324 MG tablet Take 1 tablet (324 mg total) by mouth 2 (two) times daily with a meal. 60 tablet  0  . methocarbamol (ROBAXIN) 500 MG tablet Take 1 tablet (500 mg total) by mouth 2 (two) times daily as needed for muscle spasms. 10 tablet 0  . Multiple Vitamin (MULTIVITAMIN WITH MINERALS) TABS tablet Take 1 tablet by mouth daily. 30 tablet 0  . psyllium (HYDROCIL/METAMUCIL) 95 % PACK Take 1 packet by mouth daily as needed for mild constipation. As needed for bulking of stools. 56 each   . senna-docusate (SENOKOT-S) 8.6-50 MG tablet Take 2 tablets by mouth 2 (two) times daily. 100 tablet 0  . sertraline (ZOLOFT) 100 MG tablet Take 1 tablet (100 mg total) by mouth daily. 30 tablet 0  . alum & mag hydroxide-simeth (MAALOX/MYLANTA) 200-200-20 MG/5ML suspension Take 30 mLs by mouth every 4 (four) hours as needed for indigestion. (Patient not taking: Reported on 06/18/2016) 355 mL 0  . Lacosamide 100 MG TABS Take 1 tablet (100 mg total) by mouth 2 (two) times daily. (Patient not taking: Reported on 06/18/2016) 60 tablet 0  . simethicone (MYLICON) 80 MG chewable tablet Chew 1 tablet (80 mg total) by mouth every 6 (six) hours as needed for flatulence. (Patient not taking: Reported on 06/18/2016) 30 tablet 0  . Menthol-Methyl Salicylate (MUSCLE RUB) 10-15 % CREA Apply 1 application topically 4 (four) times  daily -  before meals and at bedtime. To back muscles.  0   No facility-administered medications prior to visit.     ROS Review of Systems  Constitutional: Negative for activity change, appetite change and fatigue.  HENT: Negative for congestion, sinus pressure and sore throat.   Eyes: Negative for visual disturbance.  Respiratory: Negative for cough, chest tightness, shortness of breath and wheezing.   Cardiovascular: Negative for chest pain and palpitations.  Gastrointestinal: Positive for abdominal pain. Negative for abdominal distention and constipation.  Endocrine: Negative for polydipsia.  Genitourinary: Negative for dysuria and frequency.  Musculoskeletal: Negative for arthralgias and back  pain.  Skin: Negative for rash.  Neurological: Positive for weakness. Negative for tremors, light-headedness and numbness.  Hematological: Does not bruise/bleed easily.  Psychiatric/Behavioral: Negative for agitation and behavioral problems.    Objective:  BP 118/80 (BP Location: Right Arm, Patient Position: Sitting, Cuff Size: Large)   Pulse 70   Temp 98.3 F (36.8 C) (Oral)   Ht 6\' 2"  (1.88 m)   Wt 192 lb (87.1 kg)   LMP  (Within Months) Comment: pt currently not having periods, last one sometime in march no chance of pregnancy per pt  SpO2 99%   BMI 24.65 kg/m   BP/Weight 06/18/2016 06/17/2016 06/16/2016  Systolic BP 118 128 -  Diastolic BP 80 78 -  Wt. (Lbs) 192 - 188  BMI 24.65 - 26.22      Physical Exam  Constitutional: She is oriented to person, place, and time. She appears well-developed and well-nourished.  Cardiovascular: Normal rate, normal heart sounds and intact distal pulses.   No murmur heard. Pulmonary/Chest: Effort normal and breath sounds normal. She has no wheezes. She has no rales. She exhibits no tenderness.  Abdominal: Soft. Bowel sounds are normal. She exhibits no distension and no mass. There is tenderness.  Open vertical Abdominal wound  Musculoskeletal:  Left ankle brace in place  Neurological: She is alert and oriented to person, place, and time.  Motor strength is 5/5 in all extremities except in left lower extremity- 4/5     Assessment & Plan:   1. Left hemiparesis (HCC) Fall precautions Continue physical therapy  2. Major depressive disorder, recurrent severe without psychotic features (HCC) Currently on Zoloft and Klonopin Scheduled to see Vesta Mixer and will be receiving psychotherapeutic services at [facility] Social support provided by sister  3. Multiple fractures of ribs of right side, closed, initial encounter Advised to keep appointment orthopedics  4. Small intestine injury, initial encounter Continue dressing of  abdominal wound which is healing by secondary intention Dressing change performed in clinic  5. Post-traumatic seizures (HCC) Does not have a prescription for Lacosamide and was informed that would need a prior authorization. I have discussed with her she would need to have failed one of them combinational antiseizure medications prior to prior authorization being performed. I am happy to place her on Keppra however she will need to get in touch with her neurologist in the event that he wanted her to stay on Lacosamide in which case prior auth will be performed by Neuro   No orders of the defined types were placed in this encounter.   Follow-up: Return in about 3 weeks (around 07/09/2016) for coordination of care.   Jaclyn Shaggy MD

## 2016-06-21 ENCOUNTER — Telehealth (HOSPITAL_COMMUNITY): Payer: Self-pay

## 2016-06-21 NOTE — Progress Notes (Signed)
Contacted pt and sister this afternoon after speaking with Technical brewerCommunity Care Manager, Baldemar LenisLaquanda York with [facility].  Explained that I have provided Ms. York with information on all the referrals that were made for pt, [facility].  Pt and sister understand that Ms. York will be reaching out to them by tomorrow to introduce herself and her role.   I had also contacted Psychotherapeutic Services as well this morning to confirm they will be opening pt's case.  They did confirm they had received the referral and will be contacting pt to set up appointment for formal intake assessment and start of care.  (Ms. Elyn PeersYork also aware of this and will follow up that this contact takes place.) Pt and sister report they are settled in at home and no concerns.  They know how to reach me if any questions about follow up services or any concerns.  Mackenze Grandison, LCSW

## 2016-06-24 ENCOUNTER — Ambulatory Visit: Payer: Medicaid Other | Admitting: Physical Therapy

## 2016-06-24 ENCOUNTER — Telehealth: Payer: Self-pay | Admitting: Family Medicine

## 2016-06-24 NOTE — Telephone Encounter (Signed)
Pt. Called requesting to speak with her nurse regarding what pain medication she can take. Pt. States she had a stroke and a seizure. Please f/u

## 2016-06-28 ENCOUNTER — Encounter: Payer: Self-pay | Admitting: Physical Therapy

## 2016-06-28 ENCOUNTER — Ambulatory Visit: Payer: Medicaid Other | Attending: Physical Medicine & Rehabilitation | Admitting: Physical Therapy

## 2016-06-28 ENCOUNTER — Other Ambulatory Visit: Payer: Self-pay | Admitting: Family Medicine

## 2016-06-28 ENCOUNTER — Telehealth: Payer: Self-pay | Admitting: Family Medicine

## 2016-06-28 DIAGNOSIS — R2681 Unsteadiness on feet: Secondary | ICD-10-CM | POA: Diagnosis present

## 2016-06-28 DIAGNOSIS — M6281 Muscle weakness (generalized): Secondary | ICD-10-CM | POA: Diagnosis present

## 2016-06-28 DIAGNOSIS — R278 Other lack of coordination: Secondary | ICD-10-CM | POA: Diagnosis present

## 2016-06-28 DIAGNOSIS — R2689 Other abnormalities of gait and mobility: Secondary | ICD-10-CM | POA: Diagnosis not present

## 2016-06-28 MED ORDER — TRAMADOL HCL 50 MG PO TABS: 50.0000 mg | ORAL_TABLET | Freq: Three times a day (TID) | ORAL | 0 refills | Status: DC | PRN

## 2016-06-28 NOTE — Telephone Encounter (Signed)
Patient dropped off paperwork for place care. Please follow up.

## 2016-06-28 NOTE — Therapy (Signed)
[facility] [address] [place], [place], [address] Phone: [phone]   Fax:  [phone]  Physical Therapy Evaluation  Patient Details  Name: [name] Bell MRN: [id number] Date of Birth: 1981-10-06 Referring Provider: Dr. Claudette Laws  Encounter Date: 06/28/2016      PT End of Session - 06/28/16 1441    Visit Number 1   Number of Visits 16   Date for PT Re-Evaluation 08/27/16   Authorization Type Medicaid   PT Start Time 0930   PT Stop Time 1015   PT Time Calculation (min) 45 min      Past Medical History:  Diagnosis Date  . ADD (attention deficit disorder)    has been on medication  . Anemia   . Anxiety   . Depression   . H/O suicide attempt    at age 76.   Marland Kitchen PTSD (post-traumatic stress disorder)     Past Surgical History:  Procedure Laterality Date  . APPLICATION OF WOUND VAC  02/09/2016   Procedure: APPLICATION OF WOUND VAC;  Surgeon: Jimmye Norman, MD;  Location: [facility] OR;  Service: General;;  . APPLICATION OF WOUND VAC N/A 02/16/2016   Procedure: RE-APPLICATION OF WOUND VAC;  Surgeon: [name], MD;  Location: [facility] OR;  Service: General;  Laterality: N/A;  . BOWEL RESECTION  02/09/2016   Procedure: SMALL BOWEL RESECTION, MESENTERIC REPAIR;  Surgeon: Jimmye Norman, MD;  Location: [facility] OR;  Service: General;;  . CHEST TUBE INSERTION Right 02/11/2016   Procedure: CHEST TUBE INSERTION;  Surgeon: Jimmye Norman, MD;  Location: [facility] OR;  Service: General;  Laterality: Right;  . CHEST TUBE INSERTION Right 02/26/2016   Procedure: CHEST TUBE INSERTION;  Surgeon: Kerin Perna, MD;  Location[facility] OR;  Service: Thoracic;  Laterality: Right;  . CHOLECYSTECTOMY  02/09/2016   Procedure: CHOLECYSTECTOMY;  Surgeon: Jimmye Norman, MD;  Location: [facility] OR;  Service: General;;  . ESOPHAGOGASTRODUODENOSCOPY N/A 02/10/2016   Procedure: ESOPHAGOGASTRODUODENOSCOPY (EGD);  Surgeon: Sherrilyn Rist, MD;  Location: [facility] ENDOSCOPY;  Service:  Endoscopy;  Laterality: N/A;  . EXTERNAL FIXATION PELVIS  02/09/2016   Procedure: EXTERNAL FIXATION PELVIS;  Surgeon: Myrene Galas, MD;  Location: [facility] OR;  Service: Orthopedics;;  . HEMATOMA EVACUATION Right 05/14/2016   Procedure: EVACUATION HEMATOMA;  Surgeon: Alleen Borne, MD;  Location: [facility] OR;  Service: Thoracic;  Laterality: Right;  Evacuation of Hematoma Right Chest  . LACERATION REPAIR  02/09/2016   Procedure: REPAIR LIVER LACERATION;  Surgeon: Jimmye Norman, MD;  Location: [facility] OR;  Service: General;;  . LAPAROTOMY N/A 02/10/2016   Procedure: EXPLORATORY LAPAROTOMY, removal of packs,  cauterization of liver, repacking of liver, and open abdomen vac application;  Surgeon: [name], MD;  Location: [facility] OR;  Service: General;  Laterality: N/A;  . LAPAROTOMY N/A 02/11/2016   Procedure: EXPLORATORY LAPAROTOMY VAC CHANGE ;  Surgeon: Jimmye Norman, MD;  Location: [facility] OR;  Service: General;  Laterality: N/A;  . LAPAROTOMY N/A 02/13/2016   Procedure: EXPLORATORY LAPAROTOMY, REMOVAL OF PACKS, ABDOMINAL VAC DRESSING CHANGE;  Surgeon: [name], MD;  Location: [facility] OR;  Service: General;  Laterality: N/A;  . LAPAROTOMY N/A 02/18/2016   Procedure: EXPLORATORY LAPAROTOMY, PLACEMENT OF ABRA ABDOMINAL WALL CLOSURE SET;  Surgeon: Jimmye Norman, MD;  Location: [facility] OR;  Service: General;  Laterality: N/A;  . LAPAROTOMY N/A 02/09/2016   Procedure: EXPLORATORY LAPAROTOMY;  Surgeon: Jimmye Norman, MD;  Location: [facility] OR;  Service: General;  Laterality: N/A;  . LAPAROTOMY  N/A 02/16/2016   Procedure: EXPLORATORY LAPAROTOMY, ABDOMINAL WASH OUT;  Surgeon: [name], MD;  Location: [facility] OR;  Service: General;  Laterality: N/A;  . PERCUTANEOUS TRACHEOSTOMY N/A 03/01/2016   Procedure: PERCUTANEOUS TRACHEOSTOMY;  Surgeon: Jimmye Norman, MD;  Location: [facility] OR;  Service: General;  Laterality: N/A;  . SACRO-ILIAC PINNING Right 02/16/2016   Procedure: Loyal Gambler;  Surgeon: Myrene Galas, MD;  Location: [facility] OR;  Service: Orthopedics;   Laterality: Right;  . TEE WITHOUT CARDIOVERSION N/A 05/14/2016   Procedure: TRANSESOPHAGEAL ECHOCARDIOGRAM (TEE);  Surgeon: Alleen Borne, MD;  Location: [facility] OR;  Service: Thoracic;  Laterality: N/A;  . THORACOTOMY/LOBECTOMY Right 04/26/2016   Procedure: Right THORACOTOMY AND DRAINAGE OF EMPYEMA;  Surgeon: Alleen Borne, MD;  Location: [facility] OR;  Service: Thoracic;  Laterality: Right;  Marland Kitchen VIDEO ASSISTED THORACOSCOPY (VATS)/THOROCOTOMY Right 05/14/2016   Procedure: RIGHT VIDEO ASSISTED THORACOSCOPY (VATS),DRAINAGE OF EMPYEMA;  Surgeon: Alleen Borne, MD;  Location: [facility] OR;  Service: Thoracic;  Laterality: Right;  . WOUND DEBRIDEMENT N/A 03/01/2016   Procedure: ABDOMINAL WOUND CLOSURE;  Surgeon: Jimmye Norman, MD;  Location: [facility] OR;  Service: General;  Laterality: N/A;    There were no vitals filed for this visit.       Subjective Assessment - 06/28/16 1412    Subjective Pt presents to PT with use of LBQC for assistance with ambulation with use of AFO ([place]) on LLE due to L hemiparesis due to CVA on 05-10-16   Pertinent History Suicide attempt on 02-09-16 (fall from 5th floor hotel room) with multiple injuries sustained including TBI with LOC, multiple pelvic fractures, L wrist fracture, multiple fractures of ribs R and L sides; liver laceration:  bil. pulmonary contusion:  acute respiratory failure:  cerebral embolism with cerebral infarction and seizure sustained on 05-10-16                                                                   How long can you sit comfortably? 30" - then start moving to try to get more comfortable   How long can you stand comfortably? 15-20"   How long can you walk comfortably? 200'   Patient Stated Goals "to get my toes to wiggle and to be able to walk without cane and get up out of chairs easier"            [facility] PT Assessment - 06/28/16 0946      Assessment   Medical Diagnosis TBI:  Multiple injuries due to fall:   R CVA: Seizure   Referring Provider Dr. Claudette Laws   Onset Date/Surgical Date 02/09/16  05-10-16 for CVA and seizure   Hand Dominance Right   Prior Therapy pt was on CIR 7-27 - 06-17-16     Precautions   Precautions Fall   Precaution Comments No driving, no swimming, no tub baths or hot tub   Required Braces or Orthoses Other Brace/Splint  AFO on LLE     Balance Screen   Has the patient fallen in the past 6 months Yes   How many times? 1   Has the patient had a decrease in activity level because of a fear of falling?  No   Is the patient reluctant to leave their home because  of a fear of falling?  No     Home Environment   Living Environment Private residence   Living Arrangements Other relatives  sister   Available Help at Discharge Available 24 hours/day   Type of Home House   Home Access Level entry   Home Layout Two level   Home Equipment Toilet riser;Shower seat  shower seat has been ordered - doing sponge baths at this ti     Prior Function   Level of Independence Independent   Vocation Unemployed     Sensation   Light Touch Impaired by gross assessment  in bil. feet   Additional Comments pt reports tingling in bil feet     ROM / Strength   AROM / PROM / Strength Strength     Strength   Overall Strength Comments unable to test L hip ext. and abductors in anti gravity position   Left Hip Flexion 3+/5   Left Hip Extension 3-/5   Left Hip ABduction 3-/5   Right/Left Knee Left   Left Knee Flexion 3+/5  in seated position   Left Knee Extension 4-/5   Left Ankle Dorsiflexion 1/5   Left Ankle Plantar Flexion 1/5     Ambulation/Gait   Ambulation/Gait Yes   Ambulation/Gait Assistance 5: Supervision   Ambulation Distance (Feet) 150 Feet   Assistive device Large base quad cane   Gait Pattern Decreased dorsiflexion - left;Decreased weight shift to left;Decreased step length - left;Decreased hip/knee flexion - left   Ambulation Surface Level;Indoor   Gait velocity 1.59 ft/sec  20.69   Stairs Yes   Stairs  Assistance 5: Supervision   Stairs Assistance Details (indicate cue type and reason) step by step negotiation   Stair Management Technique Two rails   Number of Stairs 4   Height of Stairs 6     Standardized Balance Assessment   Standardized Balance Assessment Timed Up and Go Test     Timed Up and Go Test   Normal TUG (seconds) 23.81  with LBQC                             PT Short Term Goals - 06/28/16 1627      PT SHORT TERM GOAL #1   Title Improve TUG score to </= 18 secs with LBQC to demonstrate improved functional mobility.  (07-29-16)   Baseline 23.81 secs with LBQC   Time 4   Period Weeks   Status New     PT SHORT TERM GOAL #2   Title Improve gait velocity to >/= 2.0 ft/sec with [facility] for incr. gait efficiency.  (07-29-16)   Baseline 1.59 ft/sec = 20.69 secs with quad cane   Time 4   Period Weeks   Status New     PT SHORT TERM GOAL #3   Title Amb. 100' with min assist with single point cane on flat, even surface.  (07-29-16)   Baseline Pt able to amb. with [facility] with S approx. 200' on flat, even surface.   Time 4   Period Weeks   Status New     PT SHORT TERM GOAL #4   Title Increase Berg balance test score by at least 5 points to reduce fall risk.  (07-29-16)   Baseline Berg test to be completed next session - Not tested due to time constraint at evaluation   Time 4   Period Weeks   Status New  PT SHORT TERM GOAL #5   Title Independent in HEP for LLE strengthening and stretching.  (07-29-16)   Baseline Dependent    Time 4   Period Weeks   Status New           PT Long Term Goals - 06/28/16 1629      PT LONG TERM GOAL #1   Title Improve TUG score to </= 14.5 secs with LBQC to reduce fall risk.  (09-27-16)   Baseline 23.81 secs with LBQC   Time 12   Period Weeks   Status New     PT LONG TERM GOAL #2   Title Improve gait velocity to >/= 2.5 ft/sec with single point cane for incr. gait efficiency.  (09-27-16)   Baseline 1.59 ft/sec  with [facility]   Time 12   Period Weeks   Status New     PT LONG TERM GOAL #3   Title Modified independent with household ambulation without device with AFO.  (09-27-16)   Baseline Pt requires use of LBQC for assist. with ambulation - with supervision for safety   Time 12   Period Weeks   Status New     PT LONG TERM GOAL #4   Title Amb. 1000' with single point cane with S on flat even and uneven surfaces for incr. community accessibility.  (09-27-16)   Baseline 200' with SBA on uneven surface with LBQC   Time 12   Period Weeks   Status New     PT LONG TERM GOAL #5   Title Verbalize understanding of options for community exercise programs upon D/C from PT.  (09-27-16)   Baseline Dependent   Time 12   Period Weeks   Status New               Plan - 06/28/16 1451    Clinical Impression Statement Pt is a 35 year old female s/p TBI with loss of consciousness of 1 hour to 5 hrs 59 minutes (ICD 10 code S06.2X3) and multiple injuries sustained due to suicide attempt of fall from 5th floor hotel room on 02-09-16.  Pt sustained a R CVA ((I63.411) on 05-09-16 and a seizure on 05-12-16, while hospitalized.  Pt was hospitalized from 02-09-16 until 06-17-16; she was transferred to inpatient rehab on 06-03-16 and discharged home on 06-17-16 with 24 hr supervision.  Pt sustained multiple pelvic fractures, L wrist fracture, multiple fractures of ribs of R and L sides, liver laceration, acute blood loss anemia, bilateral pulmonary contusion, acute respiratory failure, cerebral embolism with cerebral infarction,  and s/p thoracotomy.  Pt presents with LLE weakness and is wearing an AFO on LLE due to ankle musculature weakness.  Pt also presents with standing balance deficits, especially decreased high level balance skills.                                                                                                                        Rehab Potential Good   PT  Frequency 2x / week   PT Duration 12 weeks   PT  Treatment/Interventions ADLs/Self Care Home Management;DME Instruction;Gait training;Stair training;Functional mobility training;Orthotic Fit/Training;Patient/family education;Neuromuscular re-education;Balance training;Therapeutic exercise;Therapeutic activities   PT Next Visit Plan complete Berg;  begin HEP for LLE strengthening   PT Home Exercise Plan LLE strengthening   Consulted and Agree with Plan of Care Patient      Patient will benefit from skilled therapeutic intervention in order to improve the following deficits and impairments:  Abnormal gait, Decreased balance, Decreased coordination, Decreased endurance, Pain, Decreased mobility, Decreased strength, Decreased skin integrity, Impaired sensation, Impaired tone  Visit Diagnosis: Other abnormalities of gait and mobility - Plan: PT plan of care cert/re-cert  Muscle weakness (generalized) - Plan: PT plan of care cert/re-cert  Unsteadiness on feet - Plan: PT plan of care cert/re-cert  Other lack of coordination - Plan: PT plan of care cert/re-cert     Problem List Patient Active Problem List   Diagnosis Date Noted  . Seizures (HCC) 06/18/2016  . Hx of suicide attempt   . 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 Left hemiparesis (HCC)   . S/P thoracotomy 05/14/2016  . Cerebral embolism with cerebral infarction 05/11/2016  . 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 Multiple pelvic fractures (HCC) 03/03/2016  . Left wrist fracture 03/03/2016  . Multiple fractures of ribs of right side 03/03/2016  . Fracture of multiple ribs of left side 03/03/2016  . Liver laceration 03/03/2016  . Small intestine injury 03/03/2016  . Acute blood loss anemia 03/03/2016  . Suicide attempt (HCC) 03/03/2016  . Bilateral pulmonary contusion 03/03/2016  . Acute respiratory failure (HCC) 03/03/2016  . Hypokalemia 03/03/2016  .  Hypernatremia 03/03/2016  . Acute kidney injury (HCC) 03/03/2016  . Ingestion of caustic substance 03/03/2016  . Tylenol overdose 03/03/2016  . Hyperglycemia 03/03/2016  . Hepatic failure (HCC) 03/03/2016  . Pressure ulcer 02/26/2016  . Fall from, out of or through building, not otherwise specified, initial encounter 02/09/2016  . Hypovolemic shock (HCC) 02/09/2016    Keola Heninger, Donavan BurnetLinda Suzanne, PT 06/28/2016, 4:47 PM  [facility] [address] [place], [place], [address] Phone: [phone]   Fax:  [phone]  Name: [name] Bell MRN: [id number] Date of Birth: 06/14/1981

## 2016-07-02 ENCOUNTER — Telehealth (HOSPITAL_COMMUNITY): Payer: Self-pay

## 2016-07-06 ENCOUNTER — Emergency Department (HOSPITAL_COMMUNITY): Payer: Medicaid Other

## 2016-07-06 ENCOUNTER — Emergency Department (HOSPITAL_COMMUNITY)
Admission: EM | Admit: 2016-07-06 | Discharge: 2016-07-06 | Disposition: A | Discharge status: Home / Self Care | Payer: Medicaid Other | Attending: Emergency Medicine | Admitting: Emergency Medicine

## 2016-07-06 ENCOUNTER — Encounter (HOSPITAL_COMMUNITY): Payer: Self-pay

## 2016-07-06 ENCOUNTER — Telehealth: Payer: Self-pay | Admitting: Neurology

## 2016-07-06 DIAGNOSIS — Z8673 Personal history of transient ischemic attack (TIA), and cerebral infarction without residual deficits: Secondary | ICD-10-CM | POA: Diagnosis not present

## 2016-07-06 DIAGNOSIS — Z7982 Long term (current) use of aspirin: Secondary | ICD-10-CM | POA: Insufficient documentation

## 2016-07-06 DIAGNOSIS — R4701 Aphasia: Secondary | ICD-10-CM | POA: Diagnosis not present

## 2016-07-06 LAB — RAPID URINE DRUG SCREEN, HOSP PERFORMED
AMPHETAMINES: NOT DETECTED
BARBITURATES: NOT DETECTED
BENZODIAZEPINES: NOT DETECTED
Cocaine: NOT DETECTED
Opiates: NOT DETECTED
TETRAHYDROCANNABINOL: NOT DETECTED

## 2016-07-06 LAB — URINE MICROSCOPIC-ADD ON

## 2016-07-06 LAB — COMPREHENSIVE METABOLIC PANEL
ALK PHOS: 71 U/L (ref 38–126)
ALT: 19 U/L (ref 14–54)
ANION GAP: 8 (ref 5–15)
AST: 31 U/L (ref 15–41)
Albumin: 4.2 g/dL (ref 3.5–5.0)
BUN: 12 mg/dL (ref 6–20)
CALCIUM: 10.1 mg/dL (ref 8.9–10.3)
CO2: 26 mmol/L (ref 22–32)
Chloride: 103 mmol/L (ref 101–111)
Creatinine, Ser: 0.69 mg/dL (ref 0.44–1.00)
GFR calc non Af Amer: >60 mL/min (ref >60)
Glucose, Bld: 79 mg/dL (ref 65–99)
POTASSIUM: 4.5 mmol/L (ref 3.5–5.1)
SODIUM: 137 mmol/L (ref 135–145)
Total Bilirubin: 0.6 mg/dL (ref 0.3–1.2)
Total Protein: 7.3 g/dL (ref 6.5–8.1)

## 2016-07-06 LAB — CBC WITH DIFFERENTIAL/PLATELET
BASOS ABS: 0 10*3/uL (ref 0.0–0.1)
BASOS PCT: 0 %
Eosinophils Absolute: 0.3 10*3/uL (ref 0.0–0.7)
Eosinophils Relative: 5 %
HEMATOCRIT: 41.3 % (ref 36.0–46.0)
HEMOGLOBIN: 12.8 g/dL (ref 12.0–15.0)
LYMPHS PCT: 28 %
Lymphs Abs: 1.8 10*3/uL (ref 0.7–4.0)
MCH: 26.7 pg (ref 26.0–34.0)
MCHC: 31 g/dL (ref 30.0–36.0)
MCV: 86 fL (ref 78.0–100.0)
MONOS PCT: 7 %
Monocytes Absolute: 0.5 10*3/uL (ref 0.1–1.0)
NEUTROS ABS: 3.8 10*3/uL (ref 1.7–7.7)
NEUTROS PCT: 60 %
Platelets: 230 10*3/uL (ref 150–400)
RBC: 4.8 MIL/uL (ref 3.87–5.11)
RDW: 14.6 % (ref 11.5–15.5)
WBC: 6.4 10*3/uL (ref 4.0–10.5)

## 2016-07-06 LAB — URINALYSIS, ROUTINE W REFLEX MICROSCOPIC
Bilirubin Urine: NEGATIVE
Glucose, UA: NEGATIVE mg/dL
Ketones, ur: NEGATIVE mg/dL
Leukocytes, UA: NEGATIVE
NITRITE: NEGATIVE
PROTEIN: NEGATIVE mg/dL
SPECIFIC GRAVITY, URINE: 1.014 (ref 1.005–1.030)
pH: 7 (ref 5.0–8.0)

## 2016-07-06 LAB — CBG MONITORING, ED: Glucose-Capillary: 73 mg/dL (ref 65–99)

## 2016-07-06 NOTE — Telephone Encounter (Signed)
I agree with calling 911. She has hx of stroke and seizure. Thanks.   Marvel PlanJindong Shanay Woolman, MD PhD Stroke Neurology 07/06/2016 2:11 PM

## 2016-07-06 NOTE — ED Triage Notes (Signed)
Pt. Coming from home via [facility] for an episode of aphasia this morning lasting around 1 min. Pt. Hx of stroke from brain bleed in July 3rd. Pt. D/c Aug 10. Pt. Ambulatory with with cane at home for leftover L-sided arm weakness and partial leg paralysis. Pt. Symptoms of aphasia have since resolved. Pt. Aox4 and ambulatory with assistance.

## 2016-07-06 NOTE — Telephone Encounter (Signed)
Patient called 9:55:16 to advise she had "weired symptoms about 5 minutes ago, started stuttering and got real dizzy, feels like body is shaking but it isn't", Sister is there with her now (patient has stroke follow up appointment with Dr. Roda ShuttersXu September 19th). Advised patient to call 911, patient agrees and understands, will hang up and call 911.

## 2016-07-06 NOTE — ED Notes (Signed)
Pt transported to CT ?

## 2016-07-06 NOTE — Discharge Instructions (Signed)
You have been seen today for aphasia. Your imaging and lab tests showed no acute abnormalities. Follow up with the neurologist as soon as possible. Call the office to see if they can move your appointment forward. Return to the ED should symptoms recur.

## 2016-07-06 NOTE — ED Notes (Signed)
CBG 73 

## 2016-07-06 NOTE — ED Provider Notes (Signed)
[facility]-EMERGENCY DEPT Provider Note   CSN: [id number] Arrival date & time: 07/06/16  1043     History   Chief Complaint Chief Complaint  Patient presents with  . Aphasia    HPI [name] Bell is a 35 y.o. female.  HPI   [name] Bell is a 35 y.o. female, with a history of severe TBI, stroke, multiple surgeries, depression, and multiple suicide attempts, presenting to the ED with an episode of expressive aphasia around 10 AM this morning. Pt was talking on the phone and began to have difficulty saying the words she wanted to say. She states that the conversation made sense to her and she was aware during the entire episode. Adds that she had some trouble controlling her hands trying to type. Episode lasted around 1 minute. Pt became anxious following this episode, but has since recovered. No current complaints or deficits.   Residual left arm weakness and left leg partial paralysis, for which she must use a cane for ambulation. No recent falls or trauma since her originally injury in April 2017.  Patient was admitted for 115 days beginning in April due to a suicide attempt. Patient was discharged August 10. Patient has an appointment with neurology on September 19.  Past Medical History:  Diagnosis Date  . ADD (attention deficit disorder)    has been on medication  . Anemia   . Anxiety   . Depression   . H/O suicide attempt    at age 65.   Marland Kitchen PTSD (post-traumatic stress disorder)     Patient Active Problem List   Diagnosis Date Noted  . Seizures (HCC) 06/18/2016  . Hx of suicide attempt   . 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 Left hemiparesis (HCC)   . S/P thoracotomy 05/14/2016  . Cerebral embolism with cerebral infarction 05/11/2016  . 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 Multiple pelvic fractures (HCC) 03/03/2016  . Left wrist fracture 03/03/2016  .  Multiple fractures of ribs of right side 03/03/2016  . Fracture of multiple ribs of left side 03/03/2016  . Liver laceration 03/03/2016  . Small intestine injury 03/03/2016  . Acute blood loss anemia 03/03/2016  . Suicide attempt (HCC) 03/03/2016  . Bilateral pulmonary contusion 03/03/2016  . Acute respiratory failure (HCC) 03/03/2016  . Hypokalemia 03/03/2016  . Hypernatremia 03/03/2016  . Acute kidney injury (HCC) 03/03/2016  . Ingestion of caustic substance 03/03/2016  . Tylenol overdose 03/03/2016  . Hyperglycemia 03/03/2016  . Hepatic failure (HCC) 03/03/2016  . Pressure ulcer 02/26/2016  . Fall from, out of or through building, not otherwise specified, initial encounter 02/09/2016  . Hypovolemic shock (HCC) 02/09/2016    Past Surgical History:  Procedure Laterality Date  . APPLICATION OF WOUND VAC  02/09/2016   Procedure: APPLICATION OF WOUND VAC;  Surgeon: Jimmye Norman, MD;  Location: [facility] OR;  Service: General;;  . APPLICATION OF WOUND VAC N/A 02/16/2016   Procedure: RE-APPLICATION OF WOUND VAC;  Surgeon: [name], MD;  Location: [facility] OR;  Service: General;  Laterality: N/A;  . BOWEL RESECTION  02/09/2016   Procedure: SMALL BOWEL RESECTION, MESENTERIC REPAIR;  Surgeon: Jimmye Norman, MD;  Location: [facility] OR;  Service: General;;  . CHEST TUBE INSERTION Right 02/11/2016   Procedure: CHEST TUBE INSERTION;  Surgeon: Jimmye Norman, MD;  Location: [facility] OR;  Service: General;  Laterality: Right;  . CHEST TUBE INSERTION Right  02/26/2016   Procedure: CHEST TUBE INSERTION;  Surgeon: Kerin Perna, MD;  Location: [facility] OR;  Service: Thoracic;  Laterality: Right;  . CHOLECYSTECTOMY  02/09/2016   Procedure: CHOLECYSTECTOMY;  Surgeon: Jimmye Norman, MD;  Location: [facility] OR;  Service: General;;  . ESOPHAGOGASTRODUODENOSCOPY N/A 02/10/2016   Procedure: ESOPHAGOGASTRODUODENOSCOPY (EGD);  Surgeon: Sherrilyn Rist, MD;  Location: [facility] ENDOSCOPY;  Service: Endoscopy;  Laterality: N/A;  . EXTERNAL FIXATION PELVIS   02/09/2016   Procedure: EXTERNAL FIXATION PELVIS;  Surgeon: Myrene Galas, MD;  Location: [facility] OR;  Service: Orthopedics;;  . HEMATOMA EVACUATION Right 05/14/2016   Procedure: EVACUATION HEMATOMA;  Surgeon: Alleen Borne, MD;  Location: [facility] OR;  Service: Thoracic;  Laterality: Right;  Evacuation of Hematoma Right Chest  . LACERATION REPAIR  02/09/2016   Procedure: REPAIR LIVER LACERATION;  Surgeon: Jimmye Norman, MD;  Location: [facility] OR;  Service: General;;  . LAPAROTOMY N/A 02/10/2016   Procedure: EXPLORATORY LAPAROTOMY, removal of packs,  cauterization of liver, repacking of liver, and open abdomen vac application;  Surgeon: [name], MD;  Location: [facility] OR;  Service: General;  Laterality: N/A;  . LAPAROTOMY N/A 02/11/2016   Procedure: EXPLORATORY LAPAROTOMY VAC CHANGE ;  Surgeon: Jimmye Norman, MD;  Location: [facility] OR;  Service: General;  Laterality: N/A;  . LAPAROTOMY N/A 02/13/2016   Procedure: EXPLORATORY LAPAROTOMY, REMOVAL OF PACKS, ABDOMINAL VAC DRESSING CHANGE;  Surgeon: [name], MD;  Location: [facility] OR;  Service: General;  Laterality: N/A;  . LAPAROTOMY N/A 02/18/2016   Procedure: EXPLORATORY LAPAROTOMY, PLACEMENT OF ABRA ABDOMINAL WALL CLOSURE SET;  Surgeon: Jimmye Norman, MD;  Location: [facility] OR;  Service: General;  Laterality: N/A;  . LAPAROTOMY N/A 02/09/2016   Procedure: EXPLORATORY LAPAROTOMY;  Surgeon: Jimmye Norman, MD;  Location: [facility] OR;  Service: General;  Laterality: N/A;  . LAPAROTOMY N/A 02/16/2016   Procedure: EXPLORATORY LAPAROTOMY, ABDOMINAL WASH OUT;  Surgeon: [name], MD;  Location: [facility] OR;  Service: General;  Laterality: N/A;  . PERCUTANEOUS TRACHEOSTOMY N/A 03/01/2016   Procedure: PERCUTANEOUS TRACHEOSTOMY;  Surgeon: Jimmye Norman, MD;  Location: [facility] OR;  Service: General;  Laterality: N/A;  . SACRO-ILIAC PINNING Right 02/16/2016   Procedure: Loyal Gambler;  Surgeon: Myrene Galas, MD;  Location: [facility] OR;  Service: Orthopedics;  Laterality: Right;  . TEE WITHOUT CARDIOVERSION N/A  05/14/2016   Procedure: TRANSESOPHAGEAL ECHOCARDIOGRAM (TEE);  Surgeon: Alleen Borne, MD;  Location: [facility] OR;  Service: Thoracic;  Laterality: N/A;  . THORACOTOMY/LOBECTOMY Right 04/26/2016   Procedure: Right THORACOTOMY AND DRAINAGE OF EMPYEMA;  Surgeon: Alleen Borne, MD;  Location: [facility] OR;  Service: Thoracic;  Laterality: Right;  Marland Kitchen VIDEO ASSISTED THORACOSCOPY (VATS)/THOROCOTOMY Right 05/14/2016   Procedure: RIGHT VIDEO ASSISTED THORACOSCOPY (VATS),DRAINAGE OF EMPYEMA;  Surgeon: Alleen Borne, MD;  Location: [facility] OR;  Service: Thoracic;  Laterality: Right;  . WOUND DEBRIDEMENT N/A 03/01/2016   Procedure: ABDOMINAL WOUND CLOSURE;  Surgeon: Jimmye Norman, MD;  Location: [facility] OR;  Service: General;  Laterality: N/A;    OB History    No data available       Home Medications    Prior to Admission medications   Medication Sig Start Date End Date Taking? Authorizing Provider  aspirin 81 MG EC tablet Take 1 tablet (81 mg total) by mouth daily. 06/17/16  Yes Evlyn Kanner Love, PA-C  atorvastatin (LIPITOR) 20 MG tablet Take 1 tablet (20 mg total) by mouth daily at 6 PM. 06/17/16  Yes Evlyn Kanner Love, PA-C  bacitracin ointment Apply  topically daily. To abdominal incision 06/17/16  Yes Evlyn Kanner Love, PA-C  clonazePAM (KLONOPIN) 0.5 MG tablet Take 1 tablet (0.5 mg total) by mouth at bedtime. 06/17/16  Yes Evlyn Kanner Love, PA-C  ferrous gluconate (FERGON) 324 MG tablet Take 1 tablet (324 mg total) by mouth 2 (two) times daily with a meal. 06/17/16  Yes Evlyn Kanner Love, PA-C  FIBER PO Take 1 tablet by mouth daily as needed (fiber supplement).   Yes Historical Provider, MD  levETIRAcetam (KEPPRA) 500 MG tablet Take 1 tablet (500 mg total) by mouth 2 (two) times daily. 06/18/16  Yes Marvel Plan, MD  Multiple Vitamin (MULTIVITAMIN WITH MINERALS) TABS tablet Take 1 tablet by mouth daily. 06/17/16  Yes Evlyn Kanner Love, PA-C  sertraline (ZOLOFT) 100 MG tablet Take 1 tablet (100 mg total) by mouth daily. 06/17/16  Yes Evlyn Kanner Love, PA-C    traMADol (ULTRAM) 50 MG tablet Take 1 tablet (50 mg total) by mouth every 8 (eight) hours as needed. Patient taking differently: Take 50 mg by mouth every 8 (eight) hours as needed for moderate pain.  06/28/16  Yes Jaclyn Shaggy, MD  alum & mag hydroxide-simeth (MAALOX/MYLANTA) 200-200-20 MG/5ML suspension Take 30 mLs by mouth every 4 (four) hours as needed for indigestion. Patient not taking: Reported on 06/18/2016 06/15/16   Evlyn Kanner Love, PA-C  methocarbamol (ROBAXIN) 500 MG tablet Take 1 tablet (500 mg total) by mouth 2 (two) times daily as needed for muscle spasms. Patient not taking: Reported on 07/06/2016 06/17/16   Evlyn Kanner Love, PA-C  psyllium (HYDROCIL/METAMUCIL) 95 % PACK Take 1 packet by mouth daily as needed for mild constipation. As needed for bulking of stools. Patient not taking: Reported on 06/28/2016 06/17/16   Evlyn Kanner Love, PA-C  senna-docusate (SENOKOT-S) 8.6-50 MG tablet Take 2 tablets by mouth 2 (two) times daily. Patient not taking: Reported on 07/06/2016 06/17/16   Evlyn Kanner Love, PA-C  simethicone (MYLICON) 80 MG chewable tablet Chew 1 tablet (80 mg total) by mouth every 6 (six) hours as needed for flatulence. Patient not taking: Reported on 06/18/2016 06/15/16   Jacquelynn Cree, PA-C    Family History Family History  Problem Relation Age of Onset  . Hypertension Father   . Mental illness Mother   . Stroke Maternal Aunt   . Hypertension Sister   . Fibroids Sister     Social History Social History  Substance Use Topics  . Smoking status: Never Smoker  . Smokeless tobacco: Never Used  . Alcohol use No     Comment: unknown     Allergies   Review of patient's allergies indicates no known allergies.   Review of Systems Review of Systems   Physical Exam Updated Vital Signs BP 123/81   Pulse 63   Temp 98.3 F (36.8 C) (Oral)   Resp 16   SpO2 100%   Physical Exam  Constitutional: She is oriented to person, place, and time. She appears well-developed and  well-nourished. No distress.  HENT:  Head: Normocephalic and atraumatic.  Eyes: Conjunctivae and EOM are normal. Pupils are equal, round, and reactive to light.  Neck: Neck supple.  Cardiovascular: Normal rate, regular rhythm, normal heart sounds and intact distal pulses.   Pulmonary/Chest: Effort normal and breath sounds normal. No respiratory distress.  Abdominal: Soft. There is no tenderness. There is no guarding.  Musculoskeletal: She exhibits no edema or tenderness.  Limited ROM in left leg, consistent with baseline. Full ROM in all other extremities  and spine. No midline spinal tenderness.   Lymphadenopathy:    She has no cervical adenopathy.  Neurological: She is alert and oriented to person, place, and time. She has normal reflexes.  No sensory deficits. Strength 5/5 in upper extremities, but weaker on the left, which she states is normal. Strength 5/5 in right leg. Strength 4/5 in left leg, which is normal for her. Coordination intact. Cranial nerves III-XII grossly intact. No facial droop.  No difficulty with forming works or expressing herself.   Skin: Skin is warm and dry. She is not diaphoretic.  Psychiatric: She has a normal mood and affect. Her behavior is normal.  Nursing note and vitals reviewed.    ED Treatments / Results  Labs (all labs ordered are listed, but only abnormal results are displayed) Labs Reviewed  URINALYSIS, ROUTINE W REFLEX MICROSCOPIC (NOT AT [facility]) - Abnormal; Notable for the following:       Result Value   Hgb urine dipstick LARGE (*)    All other components within normal limits  URINE MICROSCOPIC-ADD ON - Abnormal; Notable for the following:    Squamous Epithelial / LPF 0-5 (*)    Bacteria, UA RARE (*)    All other components within normal limits  URINE RAPID DRUG SCREEN, HOSP PERFORMED  COMPREHENSIVE METABOLIC PANEL  CBC WITH DIFFERENTIAL/PLATELET  CBG MONITORING, ED  I-STAT BETA HCG BLOOD, ED ([facility], [facility], [facility] ONLY)    EKG  EKG  Interpretation  Date/Time:  Tuesday July 06 2016 11:05:21 EDT Ventricular Rate:  65 PR Interval:    QRS Duration: 94 QT Interval:  442 QTC Calculation: 460 R Axis:   57 Text Interpretation:  Sinus rhythm Probable left atrial enlargement Confirmed by HAVILAND MD, JULIE ([id number]) on 07/06/2016 11:08:18 AM       Radiology Ct Head Wo Contrast  Result Date: 07/06/2016 CLINICAL DATA:  Aphasia.  Recent stroke. EXAM: CT HEAD WITHOUT CONTRAST TECHNIQUE: Contiguous axial images were obtained from the base of the skull through the vertex without intravenous contrast. COMPARISON:  MRI 05/20/2016.  CT head 05/10/2016 FINDINGS: Brain: Chronic infarct right frontal convexity. This shows progressive encephalomalacia and low density compared with prior studies. Negative for acute infarct.  Negative for acute hemorrhage or mass. Mild atrophy for age.  No shift of the midline structures. Vascular: No hyperdense vessel or unexpected calcification. Skull: Negative Sinuses/Orbits: Negative Other: Negative IMPRESSION: Chronic infarct right frontal convexity.  No acute abnormality. Electronically Signed   By: Marlan Palau M.D.   On: 07/06/2016 12:39    Procedures Procedures (including critical care time)  Medications Ordered in ED Medications - No data to display   Initial Impression / Assessment and Plan / ED Course  I have reviewed the triage vital signs and the nursing notes.  Pertinent labs & imaging results that were available during my care of the patient were reviewed by me and considered in my medical decision making (see chart for details).  Clinical Course    [name] Bell presents with an episode of expressive aphasia that lasted about a minute around 10 AM today.  Patient states she feels normal upon presentation to the ED. Neuro exam consistent with patient's baseline. No acute abnormalities on labs or the patient's CT. Neurology consult. Suspect patient is appropriate for discharge and  outpatient therapy. 1:35 PM Spoke with Dr. Roxy Manns, neurologist, who agreed that the patient can follow up with neurology outpatient.  Patient to follow-up with neurology. Patient will call and try to get her  appointment moved up. Return precautions discussed. Patient voiced understanding of all instructions and is comfortable with discharge.   Vitals:   07/06/16 1046 07/06/16 1119 07/06/16 1200  BP:  123/81 127/83  Pulse:  63 64  Resp:  16 14  Temp:  98.3 F (36.8 C)   TempSrc:  Oral   SpO2: 100% 100% 100%     Final Clinical Impressions(s) / ED Diagnoses   Final diagnoses:  Aphasia         Background on this patient: Discharge summary from June 17, 2016: "Brief HPI:   Sinclair ShipKatia D Gibsonis a 35 y.o.femalewho was admitted on 02/09/16 after suicide attempt. She drank drano, jumped out of 5th floor hotel room and left DNR note. She sustained multiple injuries including right rib fractures with HPTX treated with chest tube, Pelvic fracture requiring SI pinning 4/10, multiple abdominal procedures including cholecystectomy, bowel resection and wound VAC and closure by secondary intention. Multiple left wrist fractures splinted and WBAT per Dr. Melvyn Novasrtmann, tracheostomy for VDRF and PEG placement of nutritional support. She developed empyema requiring thoracotomy for drainage on 06/20 by Dr. Laneta SimmersBartle. She tolerated extubation and decannulation without difficulty. Psychiatry following for input and medication management--[facility] chest tubes removed. 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   She developed new onset seizure on 07/05 most likely due to hemorrhagic transformation and was loaded with Vimpat. TEE 7/7 negative for vegetation or thrombus. She developed  recurrent right hemothorax requiring right thoracotomy with drainage on 07/07. Repeat MRI brain 7/13 showed resolution of right frontal diffusion abnormality suggestive of late subacute to early chronic hemorrhage of unclear etiology. Dr. Pearlean BrownieSethi questioned delayed hemorrhagic contusion from fall v/s cryptogenic infarct of unknown cause and recommends ASA once bloody pleural effusion resolves and H/H stable. CT removed on 07/17 and follow up CXR showed good resolution.  She is WBAT BUE/BLE and was showing improvement in activity tolerance. CIR was recommended for follow up therapy."  New Prescriptions New Prescriptions   No medications on file     Anselm PancoastShawn C Taiden Raybourn, Cordelia Poche-C 07/06/16 1341    Jacalyn LefevreJulie Haviland, MD 07/07/16 [phone]

## 2016-07-06 NOTE — Telephone Encounter (Signed)
FYI Dr. Roda ShuttersXu. The phone staff spoke with the patient sister. I advise them to seek the ED,and to call back thanks.

## 2016-07-08 ENCOUNTER — Telehealth: Payer: Self-pay

## 2016-07-08 NOTE — Telephone Encounter (Signed)
Patient to pick up completed disability parking placard today.

## 2016-07-09 ENCOUNTER — Encounter: Payer: Self-pay | Admitting: Family Medicine

## 2016-07-09 ENCOUNTER — Ambulatory Visit: Payer: Medicaid Other | Attending: Family Medicine | Admitting: Family Medicine

## 2016-07-09 VITALS — BP 113/74 | HR 70 | Temp 98.3°F | Ht 72.0 in | Wt 199.0 lb

## 2016-07-09 DIAGNOSIS — Z915 Personal history of self-harm: Secondary | ICD-10-CM

## 2016-07-09 DIAGNOSIS — Z9151 Personal history of suicidal behavior: Secondary | ICD-10-CM

## 2016-07-09 DIAGNOSIS — Z9189 Other specified personal risk factors, not elsewhere classified: Secondary | ICD-10-CM

## 2016-07-09 DIAGNOSIS — F332 Major depressive disorder, recurrent severe without psychotic features: Secondary | ICD-10-CM | POA: Diagnosis not present

## 2016-07-09 DIAGNOSIS — R569 Unspecified convulsions: Secondary | ICD-10-CM

## 2016-07-09 DIAGNOSIS — R319 Hematuria, unspecified: Secondary | ICD-10-CM

## 2016-07-09 DIAGNOSIS — G8194 Hemiplegia, unspecified affecting left nondominant side: Secondary | ICD-10-CM

## 2016-07-09 DIAGNOSIS — N3001 Acute cystitis with hematuria: Secondary | ICD-10-CM

## 2016-07-09 LAB — POCT URINALYSIS DIP (MANUAL ENTRY)
BILIRUBIN UA: NEGATIVE
Bilirubin, UA: NEGATIVE
GLUCOSE UA: NEGATIVE
Nitrite, UA: NEGATIVE
Protein Ur, POC: 30 — AB
SPEC GRAV UA: 1.015
Urobilinogen, UA: 1
pH, UA: 6.5

## 2016-07-09 MED ORDER — CIPROFLOXACIN HCL 500 MG PO TABS: 500.0000 mg | ORAL_TABLET | Freq: Two times a day (BID) | ORAL | 0 refills | Status: DC

## 2016-07-09 NOTE — Progress Notes (Signed)
Pt states she is here today for the following:  Establish Care Aphasia episode on Tuesday 07/06/30 - Pt states she did go to ER to be evaluated Urine - Hematuria x 3 dys Pt denies any Dsyuria, oliguria, LBP  Foot pain - night time off/on x 2 dys   Cravings - Pt states she feels like she's been eating a lot - overeating?  Pt states this started after she began taking Tramadol.  Pt states she has no there complaints at this times

## 2016-07-09 NOTE — Progress Notes (Signed)
Subjective:    Patient ID: [name] Bell, female    DOB: September 12, 1981, 35 y.o.   MRN: [id number]  HPI [name] Bell is a 35 year old female who is here for follow-up visit. She was hospitalized  From 02/09/16 through 06/17/16 for traumatic brain injury with loss of consciousness secondary to suicide attempt in which she had jumped off the fifth floor of a hotel room after drinking a poisonous substance.  She sustained multiple rib and pelvic fractures and underwent pinning; did have abdominal  lacerations with subsequent bowel resection and wound VAC closure by secondary intention. Also underwent chest tube insertion which was later decreased, PEG tube placement which was later removed. She developed new onset seizure and was commenced on Vimpat; paced on antidepressants. Subsequently transferred to comprehensive inpatient rehabilitation. She was discharged with recommendations to follow-up with rehabilitation medicine, neurosurgery, neurology and psychotherapy.  She has been undergoing physical therapy with resulting improvement; she no longer uses her wheelchair and now ambulates with the aid of a cane. She has occasional pain in her left foot which she has an ankle brace and uses tramadol for this. She is also of the opinion that tramadol has caused some increased appetite.  She did have an ED visit on 8/29 due to express a few tear which lasted a few seconds and CT scan of the head was normal. She was discharged with recommendations to follow-up with neurology. She has noticed that she does get exhausted when she has to work on a computer for a prolonged period.  Complains of "strawberry colored urine"for the last 3 days but denies dysuria or frequency.   Past Medical History:  Diagnosis Date  . ADD (attention deficit disorder)    has been on medication  . Anemia   . Anxiety   . Depression   . H/O suicide attempt    at age 25.   Marland Kitchen PTSD (post-traumatic stress disorder)     Past  Surgical History:  Procedure Laterality Date  . APPLICATION OF WOUND VAC  02/09/2016   Procedure: APPLICATION OF WOUND VAC;  Surgeon: Jimmye Norman, MD;  [facility] OR;  Service: General;;  . APPLICATION OF WOUND VAC N/A 02/16/2016   Procedure: RE-APPLICATION OF WOUND VAC;  Surgeon: [name], MD;  [facility] OR;  Service: General;  Laterality: N/A;  . BOWEL RESECTION  02/09/2016   Procedure: SMALL BOWEL RESECTION, MESENTERIC REPAIR;  Surgeon: Jimmye Norman, MD;  Location: [facility] OR;  Service: General;;  . CHEST TUBE INSERTION Right 02/11/2016   Procedure: CHEST TUBE INSERTION;  Surgeon: Jimmye Norman, MD;  [facility] OR;  Service: General;  Laterality: Right;  . CHEST TUBE INSERTION Right 02/26/2016   Procedure: CHEST TUBE INSERTION;  Surgeon: Kerin Perna, MD;  Location: [facility] OR;  Service: Thoracic;  Laterality: Right;  . CHOLECYSTECTOMY  02/09/2016   Procedure: CHOLECYSTECTOMY;  Surgeon: Jimmye Norman, MD;  Location: [facility] OR;  Service: General;;  . ESOPHAGOGASTRODUODENOSCOPY N/A 02/10/2016   Procedure: ESOPHAGOGASTRODUODENOSCOPY (EGD);  Surgeon: Sherrilyn Rist, MD;  Location: [facility] ENDOSCOPY;  Service: Endoscopy;  Laterality: N/A;  . EXTERNAL FIXATION PELVIS  02/09/2016   Procedure: EXTERNAL FIXATION PELVIS;  Surgeon: Myrene Galas, MD;  Location: [facility] OR;  Service: Orthopedics;;  . HEMATOMA EVACUATION Right 05/14/2016   Procedure: EVACUATION HEMATOMA;  Surgeon: Alleen Borne, MD;  [facility] OR;  Service: Thoracic;  Laterality: Right;  Evacuation of Hematoma Right Chest  . LACERATION REPAIR  02/09/2016   Procedure: REPAIR LIVER  LACERATION;  Surgeon: Jimmye Norman, MD;  Location: [facility] OR;  Service: General;;  . LAPAROTOMY N/A 02/10/2016   Procedure: EXPLORATORY LAPAROTOMY, removal of packs,  cauterization of liver, repacking of liver, and open abdomen vac application;  Surgeon: [name], MD;  Location: [facility] OR;  Service: General;  Laterality: N/A;  . LAPAROTOMY N/A 02/11/2016   Procedure: EXPLORATORY LAPAROTOMY  VAC CHANGE ;  Surgeon: Jimmye Norman, MD;  [facility] OR;  Service: General;  Laterality: N/A;  . LAPAROTOMY N/A 02/13/2016   Procedure: EXPLORATORY LAPAROTOMY, REMOVAL OF PACKS, ABDOMINAL VAC DRESSING CHANGE;  Surgeon: [name], MD;  [facility] OR;  Service: General;  Laterality: N/A;  . LAPAROTOMY N/A 02/18/2016   Procedure: EXPLORATORY LAPAROTOMY, PLACEMENT OF ABRA ABDOMINAL WALL CLOSURE SET;  Surgeon: Jimmye Norman, MD;  [facility] OR;  Service: General;  Laterality: N/A;  . LAPAROTOMY N/A 02/09/2016   Procedure: EXPLORATORY LAPAROTOMY;  Surgeon: Jimmye Norman, MD;  Location: [facility] OR;  Service: General;  Laterality: N/A;  . LAPAROTOMY N/A 02/16/2016   Procedure: EXPLORATORY LAPAROTOMY, ABDOMINAL WASH OUT;  Surgeon: [name], MD;  Location: [facility] OR;  Service: General;  Laterality: N/A;  . PERCUTANEOUS TRACHEOSTOMY N/A 03/01/2016   Procedure: PERCUTANEOUS TRACHEOSTOMY;  Surgeon: Jimmye Norman, MD;  Location: [facility] OR;  Service: General;  Laterality: N/A;  . SACRO-ILIAC PINNING Right 02/16/2016   Procedure: Loyal Gambler;  Surgeon: Myrene Galas, MD;  Location: [facility] OR;  Service: Orthopedics;  Laterality: Right;  . TEE WITHOUT CARDIOVERSION N/A 05/14/2016   Procedure: TRANSESOPHAGEAL ECHOCARDIOGRAM (TEE);  Surgeon: Alleen Borne, MD;  Location: [facility] OR;  Service: Thoracic;  Laterality: N/A;  . THORACOTOMY/LOBECTOMY Right 04/26/2016   Procedure: Right THORACOTOMY AND DRAINAGE OF EMPYEMA;  Surgeon: Alleen Borne, MD;  [facility] OR;  Service: Thoracic;  Laterality: Right;  Marland Kitchen VIDEO ASSISTED THORACOSCOPY (VATS)/THOROCOTOMY Right 05/14/2016   Procedure: RIGHT VIDEO ASSISTED THORACOSCOPY (VATS),DRAINAGE OF EMPYEMA;  Surgeon: Alleen Borne, MD;  [facility]: [facility] OR;  Service: Thoracic;  Laterality: Right;  . WOUND DEBRIDEMENT N/A 03/01/2016   Procedure: ABDOMINAL WOUND CLOSURE;  Surgeon: Jimmye Norman, MD;  Location: [facility] OR;  Service: General;  Laterality: N/A;    No Known Allergies  Current Outpatient  Prescriptions on File Prior to Visit  Medication Sig Dispense Refill  . aspirin 81 MG EC tablet Take 1 tablet (81 mg total) by mouth daily. 100 tablet 0  . atorvastatin (LIPITOR) 20 MG tablet Take 1 tablet (20 mg total) by mouth daily at 6 PM. 30 tablet 0  . bacitracin ointment Apply topically daily. To abdominal incision 120 g 0  . clonazePAM (KLONOPIN) 0.5 MG tablet Take 1 tablet (0.5 mg total) by mouth at bedtime. 30 tablet 0  . ferrous gluconate (FERGON) 324 MG tablet Take 1 tablet (324 mg total) by mouth 2 (two) times daily with a meal. 60 tablet 0  . FIBER PO Take 1 tablet by mouth daily as needed (fiber supplement).    Marland Kitchen levETIRAcetam (KEPPRA) 500 MG tablet Take 1 tablet (500 mg total) by mouth 2 (two) times daily. 60 tablet 2  . Multiple Vitamin (MULTIVITAMIN WITH MINERALS) TABS tablet Take 1 tablet by mouth daily. 30 tablet 0  . sertraline (ZOLOFT) 100 MG tablet Take 1 tablet (100 mg total) by mouth daily. 30 tablet 0  . traMADol (ULTRAM) 50 MG tablet Take 1 tablet (50 mg total) by mouth every 8 (eight) hours as needed. (Patient taking differently: Take 50 mg by mouth every 8 (eight) hours  as needed for moderate pain. ) 60 tablet 0   No current facility-administered medications on file prior to visit.      Review of Systems Constitutional: Negative for activity change, increased appetite HENT: Negative for congestion, sinus pressure and sore throat.   Eyes: Negative for visual disturbance.  Respiratory: Negative for cough, chest tightness, shortness of breath and wheezing.   Cardiovascular: Negative for chest pain and palpitations.  Gastrointestinal: Negative for abdominal distention and constipation.  Endocrine: Negative for polydipsia.  Genitourinary: Negative for dysuria and frequency.  Musculoskeletal: Negative for arthralgias and back pain. Positive for left ankle pain  Skin: Negative for rash.  positive for abdominal wound Neurological: Positive for weakness. Negative for  tremors, light-headedness and numbness.  Hematological: Does not bruise/bleed easily.  Psychiatric/Behavioral: Negative for agitation and behavioral problems.    Objective: Vitals:   07/09/16 1154  BP: 113/74  Pulse: 70  Temp: 98.3 F (36.8 C)  TempSrc: Oral  Weight: 199 lb (90.3 kg)  Height: 6' (1.829 m)      Physical Exam  Constitutional: She is oriented to person, place, and time. She appears well-developed and well-nourished.  Cardiovascular: Normal rate, normal heart sounds and intact distal pulses.   No murmur heard. Pulmonary/Chest: Effort normal and breath sounds normal. She has no wheezes. She has no rales. She exhibits no tenderness.  Abdominal: Soft. Bowel sounds are normal. She exhibits no distension and no mass.  Open vertical Abdominal wound with granulation tissue in base  Musculoskeletal:  Left ankle AFO brace in place  Neurological: She is alert and oriented to person, place, and time.  Motor strength is 5/5 in all extremities except in left lower extremity- 4/5        Assessment & Plan:  1. Left hemiparesis [facility](HCC) Improving Fall precautions Continue physical therapy  2. Major depressive disorder, recurrent severe without psychotic features (HCC) Currently on Zoloft and Klonopin Completed psychotherapy Referred to psychiatry Advised that she may need to be tapered off Klonopin due to habit forming nature. Continue Zoloft Social support provided by sister  3. Multiple fractures of ribs of right side, closed, initial encounter Advised to keep appointment orthopedics  4. Small intestine injury, initial encounter Continue dressing of abdominal wound which is healing by secondary intention Dressing change performed in the clinic  5. 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 Urinary tract infection Treated with ciprofloxacin. We'll see back in one month to repeat UA due to hematuria.

## 2016-07-11 LAB — URINE CULTURE: Organism ID, Bacteria: 10000

## 2016-07-14 ENCOUNTER — Encounter: Payer: Self-pay | Admitting: Physical Medicine & Rehabilitation

## 2016-07-14 ENCOUNTER — Encounter: Payer: Medicaid Other | Attending: Physical Medicine & Rehabilitation | Admitting: Physical Medicine & Rehabilitation

## 2016-07-14 VITALS — BP 110/77 | HR 75 | Resp 16

## 2016-07-14 DIAGNOSIS — I69354 Hemiplegia and hemiparesis following cerebral infarction affecting left non-dominant side: Secondary | ICD-10-CM | POA: Diagnosis not present

## 2016-07-14 DIAGNOSIS — F332 Major depressive disorder, recurrent severe without psychotic features: Secondary | ICD-10-CM | POA: Diagnosis not present

## 2016-07-14 DIAGNOSIS — G8194 Hemiplegia, unspecified affecting left nondominant side: Secondary | ICD-10-CM | POA: Diagnosis not present

## 2016-07-14 DIAGNOSIS — Z79899 Other long term (current) drug therapy: Secondary | ICD-10-CM | POA: Insufficient documentation

## 2016-07-14 DIAGNOSIS — S069X3S Unspecified intracranial injury with loss of consciousness of 1 hour to 5 hours 59 minutes, sequela: Secondary | ICD-10-CM

## 2016-07-14 DIAGNOSIS — Z87820 Personal history of traumatic brain injury: Secondary | ICD-10-CM | POA: Insufficient documentation

## 2016-07-14 DIAGNOSIS — R569 Unspecified convulsions: Secondary | ICD-10-CM | POA: Diagnosis not present

## 2016-07-14 MED ORDER — SERTRALINE HCL 100 MG PO TABS: 100.0000 mg | ORAL_TABLET | Freq: Every day | ORAL | 0 refills | Status: DC

## 2016-07-14 MED ORDER — CLONAZEPAM 0.5 MG PO TABS: 0.5000 mg | ORAL_TABLET | Freq: Every day | ORAL | 0 refills | Status: DC

## 2016-07-14 NOTE — Patient Instructions (Signed)
CONTINUE TO MAKE SURE YOU'RE GETTING ENOUGH REST EACH NIGHT  8-10 HOURS PER NIGHT   IF YOU EXPERIENCE HEADACHES OR PROBLEMS WITH CONCENTRATION--TAKE A STEP BACK AND  "RE-SET " YOURSELF. TAKE A SHORT MENTAL BREAK IF YOU CAN ALSO.   PLEASE CALL ME WITH ANY PROBLEMS OR QUESTIONS [phone])

## 2016-07-14 NOTE — Progress Notes (Signed)
Subjective:    Patient ID: [name] Bell, female    DOB: 1981-01-25, 35 y.o.   MRN: [id number]  HPI   [name] Bell is here in follow up of her TBI and CVA. She has been home with her sister and things have been going fairly well. She hasn't had any therapy yet as it "hasn't been approved" by [facility] per patient. She has contacted the [employer] company about this as well. She is doing some exercise on her own at home. She is wearing her left AFO for gait/exercise and using a quad cane for short distances at home in and in the community.    She experienced some "aphasia" and processing delays last week which lasted about a week and went to the ED. She experienced another couple episodes when she has been writing or when she's been startled by light or noise. Otherwise her language and thinking has been quite functional. [name] Bell has noticed some nausea when she's overheated.   Emotionally, she's been doing well. Her family has been supportive. She has been to psychotherapeutic services who did a thorough evaluation. She is in the process of setting up weekly therapies. Her primary is setting her up with a psychiatry.     Pain Inventory Average Pain 3 Pain Right Now 0 My pain is intermittent and aching  In the last 24 hours, has pain interfered with the following? General activity 1 Relation with others 0 Enjoyment of life 1 What TIME of day is your pain at its worst? night Sleep (in general) Good  Pain is worse with: sitting, inactivity and some activites Pain improves with: rest, therapy/exercise, pacing activities and medication Relief from Meds: 9  Mobility use a cane how many minutes can you walk? 15 ability to climb steps?  yes do you drive?  no  Function disabled: date disabled 12/2015 I need assistance with the following:  meal prep and shopping  Neuro/Psych weakness trouble walking confusion  Prior Studies Any changes since last visit?  yes  Hospital ED 07/06/16 aphasia but after CT  and workup no determination of new stroke   Physicians involved in your care Neurologist Dr Omelia BlackwaterXu--sees 07/27/16   Family History  Problem Relation Age of Onset  . Hypertension Father   . Mental illness Mother   . Stroke Maternal Aunt   . Hypertension Sister   . Fibroids Sister    Social History   Social History  . Marital status: Unknown    Spouse name: N/A  . Number of children: N/A  . Years of education: N/A   Social History Main Topics  . Smoking status: Never Smoker  . Smokeless tobacco: Never Used  . Alcohol use No     Comment: unknown  . Drug use: No  . Sexual activity: Not Asked   Other Topics Concern  . None   Social History Narrative  . None   Past Surgical History:  Procedure Laterality Date  . APPLICATION OF WOUND VAC  02/09/2016   Procedure: APPLICATION OF WOUND VAC;  Surgeon: Jimmye NormanJames [name], MD;  [facility] OR;  Service: General;;  . APPLICATION OF WOUND VAC N/A 02/16/2016   Procedure: RE-APPLICATION OF WOUND VAC;  Surgeon: [name] Thompson, MD;  Location: [facility] OR;  Service: General;  Laterality: N/A;  . BOWEL RESECTION  02/09/2016   Procedure: SMALL BOWEL RESECTION, MESENTERIC REPAIR;  Surgeon: Jimmye NormanJames [name], MD;  Location: [facility] OR;  Service: General;;  . CHEST TUBE INSERTION Right 02/11/2016   Procedure: CHEST TUBE  INSERTION;  Surgeon: Jimmye Norman, MD;  Location: [facility] OR;  Service: General;  Laterality: Right;  . CHEST TUBE INSERTION Right 02/26/2016   Procedure: CHEST TUBE INSERTION;  Surgeon: Kerin Perna, MD;  Location: [facility] OR;  Service: Thoracic;  Laterality: Right;  . CHOLECYSTECTOMY  02/09/2016   Procedure: CHOLECYSTECTOMY;  Surgeon: Jimmye Norman, MD;  Location: [facility] OR;  Service: General;;  . ESOPHAGOGASTRODUODENOSCOPY N/A 02/10/2016   Procedure: ESOPHAGOGASTRODUODENOSCOPY (EGD);  Surgeon: Sherrilyn Rist, MD;  Location: [facility] ENDOSCOPY;  Service: Endoscopy;  Laterality: N/A;  . EXTERNAL FIXATION PELVIS  02/09/2016   Procedure: EXTERNAL FIXATION PELVIS;  Surgeon: Myrene Galas, MD;  Location: [facility] OR;  Service: Orthopedics;;  . HEMATOMA EVACUATION Right 05/14/2016   Procedure: EVACUATION HEMATOMA;  Surgeon: Alleen Borne, MD;  [facility] OR;  Service: Thoracic;  Laterality: Right;  Evacuation of Hematoma Right Chest  . LACERATION REPAIR  02/09/2016   Procedure: REPAIR LIVER LACERATION;  Surgeon: Jimmye Norman, MD;  Location: [facility] OR;  Service: General;;  . LAPAROTOMY N/A 02/10/2016   Procedure: EXPLORATORY LAPAROTOMY, removal of packs,  cauterization of liver, repacking of liver, and open abdomen vac application;  Surgeon: [name], MD;  Location: [facility] OR;  Service: General;  Laterality: N/A;  . LAPAROTOMY N/A 02/11/2016   Procedure: EXPLORATORY LAPAROTOMY VAC CHANGE ;  Surgeon: Jimmye Norman, MD;  Location: [facility] OR;  Service: General;  Laterality: N/A;  . LAPAROTOMY N/A 02/13/2016   Procedure: EXPLORATORY LAPAROTOMY, REMOVAL OF PACKS, ABDOMINAL VAC DRESSING CHANGE;  Surgeon: [name], MD;  Location: [facility] OR;  Service: General;  Laterality: N/A;  . LAPAROTOMY N/A 02/18/2016   Procedure: EXPLORATORY LAPAROTOMY, PLACEMENT OF ABRA ABDOMINAL WALL CLOSURE SET;  Surgeon: Jimmye Norman, MD;  Location[facility] OR;  Service: General;  Laterality: N/A;  . LAPAROTOMY N/A 02/09/2016   Procedure: EXPLORATORY LAPAROTOMY;  Surgeon: Jimmye Norman, MD;  Location: [facility] OR;  Service: General;  Laterality: N/A;  . LAPAROTOMY N/A 02/16/2016   Procedure: EXPLORATORY LAPAROTOMY, ABDOMINAL WASH OUT;  Surgeon: [name], MD;  Location: [facility] OR;  Service: General;  Laterality: N/A;  . PERCUTANEOUS TRACHEOSTOMY N/A 03/01/2016   Procedure: PERCUTANEOUS TRACHEOSTOMY;  Surgeon: Jimmye Norman, MD;  Location: [facility] OR;  Service: General;  Laterality: N/A;  . SACRO-ILIAC PINNING Right 02/16/2016   Procedure: Loyal Gambler;  Surgeon: Myrene Galas, MD;  Location: [facility] OR;  Service: Orthopedics;  Laterality: Right;  . TEE WITHOUT CARDIOVERSION N/A 05/14/2016   Procedure: TRANSESOPHAGEAL ECHOCARDIOGRAM (TEE);  Surgeon:  Alleen Borne, MD;  Location: [facility] OR;  Service: Thoracic;  Laterality: N/A;  . THORACOTOMY/LOBECTOMY Right 04/26/2016   Procedure: Right THORACOTOMY AND DRAINAGE OF EMPYEMA;  Surgeon: Alleen Borne, MD;  [facility] OR;  Service: Thoracic;  Laterality: Right;  Marland Kitchen VIDEO ASSISTED THORACOSCOPY (VATS)/THOROCOTOMY Right 05/14/2016   Procedure: RIGHT VIDEO ASSISTED THORACOSCOPY (VATS),DRAINAGE OF EMPYEMA;  Surgeon: Alleen Borne, MD;  Location: [facility] OR;  Service: Thoracic;  Laterality: Right;  . WOUND DEBRIDEMENT N/A 03/01/2016   Procedure: ABDOMINAL WOUND CLOSURE;  Surgeon: Jimmye Norman, MD;  Location: [facility] OR;  Service: General;  Laterality: N/A;   Past Medical History:  Diagnosis Date  . ADD (attention deficit disorder)    has been on medication  . Anemia   . Anxiety   . Depression   . H/O suicide attempt    at age 42.   Marland Kitchen PTSD (post-traumatic stress disorder)    BP 110/77 (BP Location: Left Arm, Patient Position: Sitting, Cuff Size: Normal)  Pulse 75   Resp 16   LMP 06/29/2016 (Exact Date)   SpO2 99%   Opioid Risk Score:   Fall Risk Score:  `1  Depression screen PHQ 2/9  Depression screen [facility] 2/9 07/14/2016 07/09/2016  Decreased Interest 0 0  Down, Depressed, Hopeless 0 0  PHQ - 2 Score 0 0    Review of Systems  All other systems reviewed and are negative.      Objective:   Physical Exam  Constitutional: She appears well-developedand well-nourished.  HENT:  Normocephalicand atraumatic.  Eyes: Conjunctivaeand EOM are normal.  Cardiovascular: Regular rate and rhythm.  Respiratory: Effort normaland breath sounds normal. No stridor. No respiratory distress. She has no wheezes.     GI: Soft. Bowel sounds are normal. She exhibits no distension. There is no tenderness.  Midline incision healed Musculoskeletal: No edema, no tenderness. Neurological: She is alertand oriented.  Speech clear. Able to follow commands without difficulty.  Sensation intact in the left lower  extremity Deep tendon reflexes 3+ left patellar, 2+ right patellar Motor strength: 4+/5 in the right deltoid, biceps, triceps, grip, RLE: 5/5 hip flexor, knee extensor, ankle dorsiflexor. LUE: 4/5 left deltoid, biceps, triceps, grip LLE; 4/5, hip flexion, knee extension, 2/5 at the ankle dorsiflexor and 3-4/5 plantar flexor. No facial droop. Walks with steppage gait pattern. Stable balance though Skin: Skin is warmand dry. No rashnoted. No erythema.  Psychiatric: Her behavior is normal. Thought contentnormal.  Pleasant, cooperative       Assessment & Plan:  1. Left hemiparesis and cognitive deficits secondary to right MCA infarct andtraumatic brain injury, also left sacroiliac fracture status post fusion as well as bilateral superior and inferior pubic ramus fracture with Left HF weakness, weakness may be related to MCA infarct, given she has hyperreflexia in the left lower extremity                     --made referral to outpt PT for gait/strength 2.. Mood: Patient has denied any anxiety, depressive symptoms, insomnia, nightmares or intent for self harm.                      -outpt psychology/psychiatric follow up 3. TBI/R-MCA infarct with DAI with hemorrhage: ASA added  -discussed higher level cognition   -reviewed coping skills 10 New onset seizures: Controlled on vimpat bid.   Follow up in 2 months. Thirty minutes of face to face patient care time were spent during this visit. All questions were encouraged and answered.

## 2016-07-15 ENCOUNTER — Encounter (HOSPITAL_COMMUNITY): Payer: Self-pay

## 2016-07-19 ENCOUNTER — Ambulatory Visit: Payer: Medicaid Other | Attending: Physical Medicine & Rehabilitation | Admitting: Physical Therapy

## 2016-07-19 DIAGNOSIS — R2689 Other abnormalities of gait and mobility: Secondary | ICD-10-CM | POA: Insufficient documentation

## 2016-07-19 DIAGNOSIS — M6281 Muscle weakness (generalized): Secondary | ICD-10-CM | POA: Diagnosis present

## 2016-07-19 NOTE — Therapy (Signed)
[facility] [address] [place], [place], [address] Phone: [phone]   Fax:  [phone]  Physical Therapy Treatment  Patient Details  Name: [name] Bell MRN: [id number] Date of Birth: 04-01-1981 Referring Provider: Dr. Claudette Laws  Encounter Date: 07/19/2016      PT End of Session - 07/20/16 1055    Visit Number 2   Number of Visits 24   Date for PT Re-Evaluation 08/27/16   Authorization Time Period 9-8 - 10-07-16   Authorization - Visit Number 1   Authorization - Number of Visits 24   PT Start Time 1102   PT Stop Time 1148   PT Time Calculation (min) 46 min      Past Medical History:  Diagnosis Date  . ADD (attention deficit disorder)    has been on medication  . Anemia   . Anxiety   . Depression   . H/O suicide attempt    at age 37.   Marland Kitchen PTSD (post-traumatic stress disorder)     Past Surgical History:  Procedure Laterality Date  . APPLICATION OF WOUND VAC  02/09/2016   Procedure: APPLICATION OF WOUND VAC;  Surgeon: Jimmye Norman, MD;  Location: [facility] OR;  Service: General;;  . APPLICATION OF WOUND VAC N/A 02/16/2016   Procedure: RE-APPLICATION OF WOUND VAC;  Surgeon: [name], MD;  Location: [facility] OR;  Service: General;  Laterality: N/A;  . BOWEL RESECTION  02/09/2016   Procedure: SMALL BOWEL RESECTION, MESENTERIC REPAIR;  Surgeon: Jimmye Norman, MD;  Location: [facility] OR;  Service: General;;  . CHEST TUBE INSERTION Right 02/11/2016   Procedure: CHEST TUBE INSERTION;  Surgeon: Jimmye Norman, MD;  Location: [facility] OR;  Service: General;  Laterality: Right;  . CHEST TUBE INSERTION Right 02/26/2016   Procedure: CHEST TUBE INSERTION;  Surgeon: Kerin Perna, MD;  Location: [facility] OR;  Service: Thoracic;  Laterality: Right;  . CHOLECYSTECTOMY  02/09/2016   Procedure: CHOLECYSTECTOMY;  Surgeon: Jimmye Norman, MD;  Location: [facility] OR;  Service: General;;  . ESOPHAGOGASTRODUODENOSCOPY N/A 02/10/2016   Procedure:  ESOPHAGOGASTRODUODENOSCOPY (EGD);  Surgeon: Sherrilyn Rist, MD;  Location: [facility] ENDOSCOPY;  Service: Endoscopy;  Laterality: N/A;  . EXTERNAL FIXATION PELVIS  02/09/2016   Procedure: EXTERNAL FIXATION PELVIS;  Surgeon: Myrene Galas, MD;  Location: [facility] OR;  Service: Orthopedics;;  . HEMATOMA EVACUATION Right 05/14/2016   Procedure: EVACUATION HEMATOMA;  Surgeon: Alleen Borne, MD;  Location: [facility] OR;  Service: Thoracic;  Laterality: Right;  Evacuation of Hematoma Right Chest  . LACERATION REPAIR  02/09/2016   Procedure: REPAIR LIVER LACERATION;  Surgeon: Jimmye Norman, MD;  Location: [facility] OR;  Service: General;;  . LAPAROTOMY N/A 02/10/2016   Procedure: EXPLORATORY LAPAROTOMY, removal of packs,  cauterization of liver, repacking of liver, and open abdomen vac application;  Surgeon: [name], MD;  Location: [facility] OR;  Service: General;  Laterality: N/A;  . LAPAROTOMY N/A 02/11/2016   Procedure: EXPLORATORY LAPAROTOMY VAC CHANGE ;  Surgeon: Jimmye Norman, MD;  Location: [facility] OR;  Service: General;  Laterality: N/A;  . LAPAROTOMY N/A 02/13/2016   Procedure: EXPLORATORY LAPAROTOMY, REMOVAL OF PACKS, ABDOMINAL VAC DRESSING CHANGE;  Surgeon: [name], MD;  Location: [facility] OR;  Service: General;  Laterality: N/A;  . LAPAROTOMY N/A 02/18/2016   Procedure: EXPLORATORY LAPAROTOMY, PLACEMENT OF ABRA ABDOMINAL WALL CLOSURE SET;  Surgeon: Jimmye Norman, MD;  Location: [facility] OR;  Service: General;  Laterality: N/A;  . LAPAROTOMY N/A 02/09/2016   Procedure: EXPLORATORY LAPAROTOMY;  Surgeon: Jimmye NormanJames [name], MD;  [facility];  Service: General;  Laterality: N/A;  . LAPAROTOMY N/A 02/16/2016   Procedure: EXPLORATORY LAPAROTOMY, ABDOMINAL WASH OUT;  Surgeon: [name] Thompson, MD;  [facility];  Service: General;  Laterality: N/A;  . PERCUTANEOUS TRACHEOSTOMY N/A 03/01/2016   Procedure: PERCUTANEOUS TRACHEOSTOMY;  Surgeon: Jimmye NormanJames [name], MD;  [facility];  Service: General;  Laterality: N/A;  . SACRO-ILIAC PINNING Right 02/16/2016    Procedure: Loyal GamblerSACRO-ILIAC PINNING;  Surgeon: Myrene GalasMichael [name], MD;  [facility];  Service: Orthopedics;  Laterality: Right;  . TEE WITHOUT CARDIOVERSION N/A 05/14/2016   Procedure: TRANSESOPHAGEAL ECHOCARDIOGRAM (TEE);  Surgeon: Alleen BorneBryan K Bartle, MD;  [facility];  Service: Thoracic;  Laterality: N/A;  . THORACOTOMY/LOBECTOMY Right 04/26/2016   Procedure: Right THORACOTOMY AND DRAINAGE OF EMPYEMA;  Surgeon: Alleen BorneBryan K Bartle, MD;  Location: [facility] OR;  Service: Thoracic;  Laterality: Right;  Marland Kitchen. VIDEO ASSISTED THORACOSCOPY (VATS)/THOROCOTOMY Right 05/14/2016   Procedure: RIGHT VIDEO ASSISTED THORACOSCOPY (VATS),DRAINAGE OF EMPYEMA;  Surgeon: Alleen BorneBryan K Bartle, MD;  [facility]: [facility] OR;  Service: Thoracic;  Laterality: Right;  . WOUND DEBRIDEMENT N/A 03/01/2016   Procedure: ABDOMINAL WOUND CLOSURE;  Surgeon: Jimmye NormanJames [name], MD;  [facility];  Service: General;  Laterality: N/A;    There were no vitals filed for this visit.        Subjective Assessment - 07/20/16 1043    Subjective Pt reports she has been back to ED since PT evaluation - had some aphasia due to overexertion/fatigue - was scared she was having another stroke - called 911 and states it resolved before they arrived but went to ED to rule out CVA   Pertinent History Suicide attempt on 02-09-16 (fall from 5th floor hotel room) with multiple injuries sustained including TBI with LOC, multiple pelvic fractures, L wrist fracture, multiple fractures of ribs R and L sides; liver laceration:  bil. pulmonary contusion:  acute respiratory failure:  cerebral embolism with cerebral infarction and seizure sustained on 05-10-16                                                                   How long can you sit comfortably? 30" - then start moving to try to get more comfortable   How long can you stand comfortably? 15-20"   How long can you walk comfortably? 200'   Patient Stated Goals "to get my toes to wiggle and to be able to walk without cane and get up out of  chairs easier"   Currently in Pain? No/denies      TherEx;  Exs. Performed without AFO on LLE  bridging x 10 reps L SLR x 10 reps L hip abduction x 10 reps in R sidelying position L hip abduction with ER x 10 reps Knee flexion prone - LLE - 10 reps Hip extension with knee flexion - 10 reps  Bil. Heel raises in standing x 10 reps L gastroc stretch in standing 45 sec hold  Runner's stretch for L hamstring and heel cord - 30 sec hold:  Pt reports doing seated hamstring stretch at home with leg extended  Pt attempted to get in quadriped position from position but had difficulty - changed from L sidelying to R sidelying but continued to  have difficulty - Requested to stop trying due to becoming fatigued  SciFit level 1.7 x 5" with UE's and LE's                          PT Education - 07/20/16 1054    Education provided Yes   Education Details Gave pt sheet with SLR, hip abdct, hip extension lying down:  heel raises bil. LE's: standing heel cord stretch   Person(s) Educated Patient   Methods Explanation;Demonstration;Handout   Comprehension Verbalized understanding;Returned demonstration          PT Short Term Goals - 06/28/16 1627      PT SHORT TERM GOAL #1   Title Improve TUG score to </= 18 secs with [facility] to demonstrate improved functional mobility.  (07-29-16)   Baseline 23.81 secs with [facility]   Time 4   Period Weeks   Status New     PT SHORT TERM GOAL #2   Title Improve gait velocity to >/= 2.0 ft/sec with [facility] for incr. gait efficiency.  (07-29-16)   Baseline 1.59 ft/sec = 20.69 secs with quad cane   Time 4   Period Weeks   Status New     PT SHORT TERM GOAL #3   Title Amb. 100' with min assist with single point cane on flat, even surface.  (07-29-16)   Baseline Pt able to amb. with [facility] with S approx. 200' on flat, even surface.   Time 4   Period Weeks   Status New     PT SHORT TERM GOAL #4   Title Increase Berg balance test score by at  least 5 points to reduce fall risk.  (07-29-16)   Baseline Berg test to be completed next session - Not tested due to time constraint at evaluation   Time 4   Period Weeks   Status New     PT SHORT TERM GOAL #5   Title Independent in HEP for LLE strengthening and stretching.  (07-29-16)   Baseline Dependent    Time 4   Period Weeks   Status New           PT Long Term Goals - 06/28/16 1629      PT LONG TERM GOAL #1   Title Improve TUG score to </= 14.5 secs with [facility] to reduce fall risk.  (09-27-16)   Baseline 23.81 secs with [facility]   Time 12   Period Weeks   Status New     PT LONG TERM GOAL #2   Title Improve gait velocity to >/= 2.5 ft/sec with single point cane for incr. gait efficiency.  (09-27-16)   Baseline 1.59 ft/sec with [facility]   Time 12   Period Weeks   Status New     PT LONG TERM GOAL #3   Title Modified independent with household ambulation without device with AFO.  (09-27-16)   Baseline Pt requires use of [facility] for assist. with ambulation - with supervision for safety   Time 12   Period Weeks   Status New     PT LONG TERM GOAL #4   Title Amb. 1000' with single point cane with S on flat even and uneven surfaces for incr. community accessibility.  (09-27-16)   Baseline 200' with SBA on uneven surface with [facility]   Time 12   Period Weeks   Status New     PT LONG TERM GOAL #5   Title Verbalize understanding of options  for community exercise programs upon D/C from PT.  (09-27-16)   Baseline Dependent   Time 12   Period Weeks   Status New             Patient will benefit from skilled therapeutic intervention in order to improve the following deficits and impairments:     Visit Diagnosis: Other abnormalities of gait and mobility  Muscle weakness (generalized)     Problem List Patient Active Problem List   Diagnosis Date Noted  . Seizures (HCC) 06/18/2016  . Hx of suicide attempt   . Traumatic brain injury with loss of consciousness of 1 hour  to 5 hours 59 minutes (HCC) 06/03/2016  . Left hemiparesis (HCC)   . S/P thoracotomy 05/14/2016  . Cerebral embolism with cerebral infarction 05/11/2016  . 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 Multiple pelvic fractures (HCC) 03/03/2016  . Left wrist fracture 03/03/2016  . Multiple fractures of ribs of right side 03/03/2016  . Fracture of multiple ribs of left side 03/03/2016  . Liver laceration 03/03/2016  . Small intestine injury 03/03/2016  . Acute blood loss anemia 03/03/2016  . Suicide attempt (HCC) 03/03/2016  . Bilateral pulmonary contusion 03/03/2016  . Acute respiratory failure (HCC) 03/03/2016  . Hypokalemia 03/03/2016  . Hypernatremia 03/03/2016  . Acute kidney injury (HCC) 03/03/2016  . Ingestion of caustic substance 03/03/2016  . Tylenol overdose 03/03/2016  . Hyperglycemia 03/03/2016  . Hepatic failure (HCC) 03/03/2016  . Pressure ulcer 02/26/2016  . Fall from, out of or through building, not otherwise specified, initial encounter 02/09/2016  . Hypovolemic shock (HCC) 02/09/2016    Sangeeta Youse, Donavan Burnet, PT 07/20/2016, 10:59 AM  [facility] [address] [place], [place], [address] Phone: [phone]   Fax:  [phone]  Name: [name] Bell MRN: [id number] Date of Birth: [birth year]

## 2016-07-19 NOTE — Patient Instructions (Signed)
(  Clinic) Knee: Flexion / Hamstring Curl - Prone    Pulley beyond feet, lie with strap around left ankle. Pull foot toward buttocks. Repeat __10__ times per set. Do _1___ sets per session. Do __5__ sessions per week.  .  Copyright  VHI. All rights reserved.

## 2016-07-21 ENCOUNTER — Other Ambulatory Visit: Payer: Self-pay | Admitting: Pharmacist

## 2016-07-21 MED ORDER — ATORVASTATIN CALCIUM 20 MG PO TABS: 20.0000 mg | ORAL_TABLET | Freq: Every day | ORAL | 2 refills | Status: DC

## 2016-07-22 ENCOUNTER — Ambulatory Visit: Payer: Medicaid Other | Admitting: Physical Therapy

## 2016-07-22 DIAGNOSIS — R2689 Other abnormalities of gait and mobility: Secondary | ICD-10-CM | POA: Diagnosis not present

## 2016-07-22 DIAGNOSIS — M6281 Muscle weakness (generalized): Secondary | ICD-10-CM

## 2016-07-23 NOTE — Therapy (Signed)
[facility] [address] [place], [place], [address] Phone: [phone]   Fax:  [phone]  Physical Therapy Treatment  Patient Details  Name: BUELAH RENNIE MRN: [id number] Date of Birth: 07-Nov-1981 Referring Provider: Dr. Claudette Laws  Encounter Date: 07/22/2016      PT End of Session - 07/23/16 0914    Visit Number 3   Number of Visits 24   Date for PT Re-Evaluation 08/27/16   Authorization Type Medicaid   Authorization Time Period 9-8 - 10-07-16   Authorization - Visit Number 2   Authorization - Number of Visits 24   PT Start Time 1101   PT Stop Time 1147   PT Time Calculation (min) 46 min      Past Medical History:  Diagnosis Date  . ADD (attention deficit disorder)    has been on medication  . Anemia   . Anxiety   . Depression   . H/O suicide attempt    at age 35.   Marland Kitchen PTSD (post-traumatic stress disorder)     Past Surgical History:  Procedure Laterality Date  . APPLICATION OF WOUND VAC  02/09/2016   Procedure: APPLICATION OF WOUND VAC;  Surgeon: Jimmye Norman, MD;  Location: [facility] OR;  Service: General;;  . APPLICATION OF WOUND VAC N/A 02/16/2016   Procedure: RE-APPLICATION OF WOUND VAC;  Surgeon: [name], MD;  Location: [facility] OR;  Service: General;  Laterality: N/A;  . BOWEL RESECTION  02/09/2016   Procedure: SMALL BOWEL RESECTION, MESENTERIC REPAIR;  Surgeon: Jimmye Norman, MD;  Location: [facility] OR;  Service: General;;  . CHEST TUBE INSERTION Right 02/11/2016   Procedure: CHEST TUBE INSERTION;  Surgeon: Jimmye Norman, MD;  Location: [facility] OR;  Service: General;  Laterality: Right;  . CHEST TUBE INSERTION Right 02/26/2016   Procedure: CHEST TUBE INSERTION;  Surgeon: Kerin Perna, MD;  Location: [facility] OR;  Service: Thoracic;  Laterality: Right;  . CHOLECYSTECTOMY  02/09/2016   Procedure: CHOLECYSTECTOMY;  Surgeon: Jimmye Norman, MD;  Location: [facility] OR;  Service: General;;  . ESOPHAGOGASTRODUODENOSCOPY N/A 02/10/2016    Procedure: ESOPHAGOGASTRODUODENOSCOPY (EGD);  Surgeon: Sherrilyn Rist, MD;  Location: [facility] ENDOSCOPY;  Service: Endoscopy;  Laterality: N/A;  . EXTERNAL FIXATION PELVIS  02/09/2016   Procedure: EXTERNAL FIXATION PELVIS;  Surgeon: Myrene Galas, MD;  Location: [facility] OR;  Service: Orthopedics;;  . HEMATOMA EVACUATION Right 05/14/2016   Procedure: EVACUATION HEMATOMA;  Surgeon: Alleen Borne, MD;  Location: [facility] OR;  Service: Thoracic;  Laterality: Right;  Evacuation of Hematoma Right Chest  . LACERATION REPAIR  02/09/2016   Procedure: REPAIR LIVER LACERATION;  Surgeon: Jimmye Norman, MD;  Location: [facility] OR;  Service: General;;  . LAPAROTOMY N/A 02/10/2016   Procedure: EXPLORATORY LAPAROTOMY, removal of packs,  cauterization of liver, repacking of liver, and open abdomen vac application;  Surgeon: [name], MD;  Location: [facility] OR;  Service: General;  Laterality: N/A;  . LAPAROTOMY N/A 02/11/2016   Procedure: EXPLORATORY LAPAROTOMY VAC CHANGE ;  Surgeon: Jimmye Norman, MD;  Location: [facility] OR;  Service: General;  Laterality: N/A;  . LAPAROTOMY N/A 02/13/2016   Procedure: EXPLORATORY LAPAROTOMY, REMOVAL OF PACKS, ABDOMINAL VAC DRESSING CHANGE;  Surgeon: [name], MD;  Location: [facility] OR;  Service: General;  Laterality: N/A;  . LAPAROTOMY N/A 02/18/2016   Procedure: EXPLORATORY LAPAROTOMY, PLACEMENT OF ABRA ABDOMINAL WALL CLOSURE SET;  Surgeon: Jimmye Norman, MD;  Location: [facility] OR;  Service: General;  Laterality: N/A;  . LAPAROTOMY N/A 02/09/2016  Procedure: EXPLORATORY LAPAROTOMY;  Surgeon: Jimmye Norman, MD;  Location: [facility] OR;  Service: General;  Laterality: N/A;  . LAPAROTOMY N/A 02/16/2016   Procedure: EXPLORATORY LAPAROTOMY, ABDOMINAL WASH OUT;  Surgeon: [name], MD;  Location: [facility] OR;  Service: General;  Laterality: N/A;  . PERCUTANEOUS TRACHEOSTOMY N/A 03/01/2016   Procedure: PERCUTANEOUS TRACHEOSTOMY;  Surgeon: Jimmye Norman, MD;  Location: [facility] OR;  Service: General;  Laterality: N/A;  . SACRO-ILIAC PINNING Right  02/16/2016   Procedure: Loyal Gambler;  Surgeon: Myrene Galas, MD;  Location: [facility] OR;  Service: Orthopedics;  Laterality: Right;  . TEE WITHOUT CARDIOVERSION N/A 05/14/2016   Procedure: TRANSESOPHAGEAL ECHOCARDIOGRAM (TEE);  Surgeon: Alleen Borne, MD;  Location: [facility] OR;  Service: Thoracic;  Laterality: N/A;  . THORACOTOMY/LOBECTOMY Right 04/26/2016   Procedure: Right THORACOTOMY AND DRAINAGE OF EMPYEMA;  Surgeon: Alleen Borne, MD;  Location: [facility] OR;  Service: Thoracic;  Laterality: Right;  Marland Kitchen VIDEO ASSISTED THORACOSCOPY (VATS)/THOROCOTOMY Right 05/14/2016   Procedure: RIGHT VIDEO ASSISTED THORACOSCOPY (VATS),DRAINAGE OF EMPYEMA;  Surgeon: Alleen Borne, MD;  Location: [facility] OR;  Service: Thoracic;  Laterality: Right;  . WOUND DEBRIDEMENT N/A 03/01/2016   Procedure: ABDOMINAL WOUND CLOSURE;  Surgeon: Jimmye Norman, MD;  Location: [facility] OR;  Service: General;  Laterality: N/A;    There were no vitals filed for this visit.      Subjective Assessment - 07/23/16 0905    Subjective Pt reports she has been doing exercises at home; inquires about "knots/bumps" in R lateral thigh - says her doctor said it was fatty tumors   Pertinent History Suicide attempt on 02-09-16 (fall from 5th floor hotel room) with multiple injuries sustained including TBI with LOC, multiple pelvic fractures, L wrist fracture, multiple fractures of ribs R and L sides; liver laceration:  bil. pulmonary contusion:  acute respiratory failure:  cerebral embolism with cerebral infarction and seizure sustained on 05-10-16                                                                   Patient Stated Goals "to get my toes to wiggle and to be able to walk without cane and get up out of chairs easier"   Currently in Pain? No/denies                         [facility] Adult PT Treatment/Exercise - 07/23/16 0001      Exercises   Exercises Lumbar;Knee/Hip;Ankle     Lumbar Exercises: Stretches   Active Hamstring Stretch 1 rep;30  seconds  LLE     Lumbar Exercises: Standing   Heel Raises 10 reps     Lumbar Exercises: Supine   Bridge 10 reps   Straight Leg Raise 10 reps  LLE   Other Supine Lumbar Exercises Bridging with hip abduction/adduction , bridging with RLE extension 5 reps each     Lumbar Exercises: Sidelying   Clam 10 reps  LLE with 2# weight   Hip Abduction 10 reps  LLE - no weight     Knee/Hip Exercises: Aerobic   Recumbent Bike Scifit level 1.7 x 5"     Knee/Hip Exercises: Standing   Heel Raises Both;1 set;10 reps   Forward Step Up Left;1 set;10 reps;Step Height:  6"     Knee/Hip Exercises: Supine   Other Supine Knee/Hip Exercises L hip extension control off side of mat with no weight 10 reps     Knee/Hip Exercises: Prone   Hamstring Curl 1 set;10 reps   Hip Extension AROM;Left;1 set;10 reps  with L knee flexed to 90 degrees     Ankle Exercises: Seated   Heel Raises 10 reps  LLE   Other Seated Ankle Exercises active dorsiflexion LLE x 10 reps with assist. for full ROM     L closed chain plantarflexion strengthening - off side of hi/lo mat table - 2 sets of 10 reps; (R foot on floor)           PT Education - 07/23/16 0914    Education provided Yes   Education Details reviewed HEP - esp. heel cord stretch for LLE   Person(s) Educated Patient   Methods Explanation;Demonstration   Comprehension Verbalized understanding          PT Short Term Goals - 06/28/16 1627      PT SHORT TERM GOAL #1   Title Improve TUG score to </= 18 secs with LBQC to demonstrate improved functional mobility.  (07-29-16)   Baseline 23.81 secs with LBQC   Time 4   Period Weeks   Status New     PT SHORT TERM GOAL #2   Title Improve gait velocity to >/= 2.0 ft/sec with [facility] for incr. gait efficiency.  (07-29-16)   Baseline 1.59 ft/sec = 20.69 secs with quad cane   Time 4   Period Weeks   Status New     PT SHORT TERM GOAL #3   Title Amb. 100' with min assist with single point cane on flat, even  surface.  (07-29-16)   Baseline Pt able to amb. with [facility] with S approx. 200' on flat, even surface.   Time 4   Period Weeks   Status New     PT SHORT TERM GOAL #4   Title Increase Berg balance test score by at least 5 points to reduce fall risk.  (07-29-16)   Baseline Berg test to be completed next session - Not tested due to time constraint at evaluation   Time 4   Period Weeks   Status New     PT SHORT TERM GOAL #5   Title Independent in HEP for LLE strengthening and stretching.  (07-29-16)   Baseline Dependent    Time 4   Period Weeks   Status New           PT Long Term Goals - 06/28/16 1629      PT LONG TERM GOAL #1   Title Improve TUG score to </= 14.5 secs with LBQC to reduce fall risk.  (09-27-16)   Baseline 23.81 secs with LBQC   Time 12   Period Weeks   Status New     PT LONG TERM GOAL #2   Title Improve gait velocity to >/= 2.5 ft/sec with single point cane for incr. gait efficiency.  (09-27-16)   Baseline 1.59 ft/sec with [facility]   Time 12   Period Weeks   Status New     PT LONG TERM GOAL #3   Title Modified independent with household ambulation without device with AFO.  (09-27-16)   Baseline Pt requires use of LBQC for assist. with ambulation - with supervision for safety   Time 12   Period Weeks   Status New     PT  LONG TERM GOAL #4   Title Amb. 1000' with single point cane with S on flat even and uneven surfaces for incr. community accessibility.  (09-27-16)   Baseline 200' with SBA on uneven surface with LBQC   Time 12   Period Weeks   Status New     PT LONG TERM GOAL #5   Title Verbalize understanding of options for community exercise programs upon D/C from PT.  (09-27-16)   Baseline Dependent   Time 12   Period Weeks   Status New               Plan - 07/23/16 0915    Clinical Impression Statement Pt demonstrating improvement in LLE strength/control with pt able to actively abduct LLE and hold in more midline position on SciFit than  she was able to do in earlier session this week. Pt not wearing AFO today - states she has not worn it since last treatment session but has not had any problems with walking without it   Rehab Potential Good   PT Frequency 2x / week   PT Duration 12 weeks   PT Treatment/Interventions ADLs/Self Care Home Management;DME Instruction;Gait training;Stair training;Functional mobility training;Orthotic Fit/Training;Patient/family education;Neuromuscular re-education;Balance training;Therapeutic exercise;Therapeutic activities   PT Next Visit Plan complete Berg;  begin HEP for LLE strengthening   PT Home Exercise Plan LLE strengthening   Consulted and Agree with Plan of Care Patient      Patient will benefit from skilled therapeutic intervention in order to improve the following deficits and impairments:  Abnormal gait, Decreased balance, Decreased coordination, Decreased endurance, Pain, Decreased mobility, Decreased strength, Decreased skin integrity, Impaired sensation, Impaired tone  Visit Diagnosis: Other abnormalities of gait and mobility  Muscle weakness (generalized)     Problem List Patient Active Problem List   Diagnosis Date Noted  . Seizures (HCC) 06/18/2016  . Hx of suicide attempt   . 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 Left hemiparesis (HCC)   . S/P thoracotomy 05/14/2016  . Cerebral embolism with cerebral infarction 05/11/2016  . 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 Multiple pelvic fractures (HCC) 03/03/2016  . Left wrist fracture 03/03/2016  . Multiple fractures of ribs of right side 03/03/2016  . Fracture of multiple ribs of left side 03/03/2016  . Liver laceration 03/03/2016  . Small intestine injury 03/03/2016  . Acute blood loss anemia 03/03/2016  . Suicide attempt (HCC) 03/03/2016  . Bilateral pulmonary contusion 03/03/2016  . Acute respiratory  failure (HCC) 03/03/2016  . Hypokalemia 03/03/2016  . Hypernatremia 03/03/2016  . Acute kidney injury (HCC) 03/03/2016  . Ingestion of caustic substance 03/03/2016  . Tylenol overdose 03/03/2016  . Hyperglycemia 03/03/2016  . Hepatic failure (HCC) 03/03/2016  . Pressure ulcer 02/26/2016  . Fall from, out of or through building, not otherwise specified, initial encounter 02/09/2016  . Hypovolemic shock (HCC) 02/09/2016    Geneal Huebert, Donavan Burnet, PT 07/23/2016, 9:18 AM  [facility] [address] [place], [place], [address] Phone: [phone]   Fax:  [phone]  Name: ALYAH BOEHNING MRN: [id number] Date of Birth: [birth year]

## 2016-07-27 ENCOUNTER — Ambulatory Visit (INDEPENDENT_AMBULATORY_CARE_PROVIDER_SITE_OTHER): Payer: Medicaid Other | Admitting: Neurology

## 2016-07-27 ENCOUNTER — Ambulatory Visit: Payer: Medicaid Other | Admitting: Physical Therapy

## 2016-07-27 ENCOUNTER — Encounter: Payer: Self-pay | Admitting: Neurology

## 2016-07-27 VITALS — BP 108/56 | HR 74 | Ht 72.0 in | Wt 197.4 lb

## 2016-07-27 DIAGNOSIS — R2689 Other abnormalities of gait and mobility: Secondary | ICD-10-CM

## 2016-07-27 DIAGNOSIS — T1491XA Suicide attempt, initial encounter: Secondary | ICD-10-CM

## 2016-07-27 DIAGNOSIS — I63421 Cerebral infarction due to embolism of right anterior cerebral artery: Secondary | ICD-10-CM

## 2016-07-27 DIAGNOSIS — G8194 Hemiplegia, unspecified affecting left nondominant side: Secondary | ICD-10-CM

## 2016-07-27 DIAGNOSIS — R569 Unspecified convulsions: Secondary | ICD-10-CM | POA: Diagnosis not present

## 2016-07-27 DIAGNOSIS — T1491 Suicide attempt: Secondary | ICD-10-CM | POA: Diagnosis not present

## 2016-07-27 DIAGNOSIS — M6281 Muscle weakness (generalized): Secondary | ICD-10-CM

## 2016-07-27 NOTE — Patient Instructions (Signed)
-   continue ASA and lipitor for stroke prevention - continue keppra for seizure control - repeat EEG  - repeat blood tests  - aggressive PT/OT for LLE weakness - follow up with psychiatry and continue zoloft - Follow up with your primary care physician for stroke risk factor modification. Recommend maintain blood pressure goal <130/80, diabetes with hemoglobin A1c goal below 7.0% and lipids with LDL cholesterol goal below 70 mg/dL.  - avoid over exertion, relaxation, cope with stress - follow up in 3 months.

## 2016-07-27 NOTE — Progress Notes (Signed)
STROKE NEUROLOGY FOLLOW UP NOTE  NAME: [name] Bell DOB: 04/05/1981  REASON FOR VISIT: stroke follow up HISTORY FROM: pt and chart  Today we had the pleasure of seeing [name] Bell in follow-up at our [facility]rology [facility]. Pt was accompanied by no one.   History Summary [name] is a 35 y.o. female admitted on 02/09/2016 after drinking drano and jumping from a 5th floor window for suicidal attempt. She had significant trauma with multiple fractures and underwent many surgeries. She since then had significant recovery. However on 05/10/2016 she developed aphasia in the hospital with decreased movements on the L side. 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 CTA head and neck showed irregular right A3/A4 segments, but no carotid dissection. Repeat CT showed right frontal petechial hemorrhagic transformation, stable. DVT negative. TCD bubble study negative for PFO. Initial TTE showed right atrium bright target, highly mobile, concerning for possible thrombus/vegetation, but TEE showed all valve look fine with no vegetation, no LV or LA or LAA thrombus, normal LV function and ascending aorta. Hypercoagulable labs showed positive anticoagulant and mildly positive HPP and cardiolipin IgM, will need to repeat in 3 months. LDL 112 and A1C 5.4. She was put on ASA and lipitor on discharge to [facility] on 06/03/16.  Developed new onset seizure on 05/12/16, likely due to hemorrhagic infarct. Was treated with ativan and vimpat load. Continued on vimpat 100mg  bid. EEG showed right hemisphere slowing, no seizure. She was also on zoloft for depression. She was discharged on 8/10 from [facility].   Interval History During the interval time, the patient has been doing well. She had dramatic recovery. Her left UE back to normal and LLE still has mild paresis, but able to walk without cane. She is following with PT/OT.   On 07/06/16 pt was on the  phone and had two episodes of word finding difficulty, lasting 30s each and 1-2 min apart. She called 911 and at the time of EMS arrival, her symptoms all resolved. She was sent to ED and had CT head negative. She was told to follow up as outpt.  She also stated that she has daily episodes of sudden onset blurry vision in both eyes, like looking out from water drops, also felt lightheadedness. No HA, LOC, shaking or jerking. Episodes lasts 5 sec and gone. It happens several times a day and every day. She sometimes saw twinkling lights but no scotoma, flashing or wavy lines.  REVIEW OF SYSTEMS: Full 14 system review of systems performed and notable only for those listed below and in HPI above, all others are negative:  Constitutional:   Cardiovascular:  Chest pain Ear/Nose/Throat:   Skin:  Eyes:  Light sensitivity, blurred vision Respiratory:   Gastroitestinal:   Genitourinary:  Hematology/Lymphatic:   Endocrine:  Musculoskeletal:   Allergy/Immunology:   Neurological:   Psychiatric:  Sleep:   The following represents the patient's updated allergies and side effects list: No Known Allergies  The neurologically relevant items on the patient's problem list were reviewed on today's visit.  Neurologic Examination  A problem focused neurological exam (12 or more points of the single system neurologic examination, vital signs counts as 1 point, cranial nerves count for 8 points) was performed.  Blood pressure (!) 108/56, pulse 74, height 6' (1.829 m), weight 197 lb 6.4 oz (89.5 kg), last menstrual period 06/29/2016.  General - Well nourished, well developed, in no apparent  distress.  Ophthalmologic - Sharp disc margins OU.  Cardiovascular - Regular rate and rhythm with no murmur.  Mental Status -  Level of arousal and orientation to time, place, and person were intact. Language including expression, naming, repetition, comprehension was assessed and found intact. Fund of Knowledge  was assessed and was intact.  Cranial Nerves II - XII - II - Visual field intact OU. III, IV, VI - Extraocular movements intact. V - Facial sensation intact bilaterally. VII - Facial movement intact bilaterally. VIII - Hearing & vestibular intact bilaterally. X - Palate elevates symmetrically. XI - Chin turning & shoulder shrug intact bilaterally. XII - Tongue protrusion intact.  Motor Strength - The patient's strength was normal in all extremities except LLE proximal 5-/5 and knee extension 4/5, DF and PF 3/5 and pronator drift was absent.  Bulk was normal and fasciculations were absent   Motor Tone - Muscle tone was assessed at the neck and appendages and was normal.  Reflexes - The patient's reflexes were 1+ in all extremities and she had no pathological reflexes.  Sensory - Light touch, temperature/pinprick were assessed and were normal.    Coordination - The patient had normal movements in the hands with no ataxia or dysmetria.  Tremor was absent.  Gait and Station - pt has left spastic hemiparetic gait.   Functional score  mRS = 3   0 - No symptoms.   1 - No significant disability. Able to carry out all usual activities, despite some symptoms.   2 - Slight disability. Able to look after own affairs without assistance, but unable to carry out all previous activities.   3 - Moderate disability. Requires some help, but able to walk unassisted.   4 - Moderately severe disability. Unable to attend to own bodily needs without assistance, and unable to walk unassisted.   5 - Severe disability. Requires constant nursing care and attention, bedridden, incontinent.   6 - Dead.   NIH Stroke Scale   Level Of Consciousness 0=Alert; keenly responsive 1=Not alert, but arousable by minor stimulation 2=Not alert, requires repeated stimulation 3=Responds only with reflex movements 0  LOC Questions to Month and Age 29=Answers both questions correctly 1=Answers one question  correctly 2=Answers neither question correctly 0  LOC Commands      -Open/Close eyes     -Open/close grip 0=Performs both tasks correctly 1=Performs one task correctly 2=Performs neighter task correctly 0  Best Gaze 0=Normal 1=Partial gaze palsy 2=Forced deviation, or total gaze paresis 0  Visual 0=No visual loss 1=Partial hemianopia 2=Complete hemianopia 3=Bilateral hemianopia (blind including cortical blindness) 0  Facial Palsy 0=Normal symmetrical movement 1=Minor paralysis (asymmetry) 2=Partial paralysis (lower face) 3=Complete paralysis (upper and lower face) 0  Motor  0=No drift, limb holds posture for full 10 seconds 1=Drift, limb holds posture, no drift to bed 2=Some antigravity effort, cannot maintain posture, drifts to bed 3=No effort against gravity, limb falls 4=No movement Right Arm 0     Leg 0    Left Arm 0     Leg 1  Limb Ataxia 0=Absent 1=Present in one limb 2=Present in two limbs 0  Sensory 0=Normal 1=Mild to moderate sensory loss 2=Severe to total sensory loss 0  Best Language 0=No aphasia, normal 1=Mild to moderate aphasia 2=Mute, global aphasia 3=Mute, global aphasia 0  Dysarthria 0=Normal 1=Mild to moderate 2=Severe, unintelligible or mute/anarthric 0  Extinction/Neglect 0=No abnormality 1=Extinction to bilateral simultaneous stimulation 2=Profound neglect 0  Total   1  Data reviewed: I personally reviewed the images and agree with the radiology interpretations.  Dg Chest 2 View 05/10/2016  Enlarging loculated right pleural fluid collection, possibly reaccumulation of an empyema. Probable airspace opacification in the right lung. Electronically Signed   By: Leanna Battles M.D.   On: 05/10/2016 16:00   Ct Head Wo Contrast 05/14/2016 1. Continued interval evolution of acute right MCA territory infarct with associated small volume petechial hemorrhage. Similar localized edema without significant mass effect or midline shift. 2. No new  intracranial process.  05/13/2016 Evolution of the right frontal lobe infarct with a new area of hemorrhage within this infarct. No hemorrhage seen elsewhere. No new infarct seen elsewhere. No midline shift. No subdural or epidural fluid collections.  05/10/2016  SUBTLE AREA OF LOW DENSITY AND LOSS OF GRAY-WHITE DIFFERENTIATION IN THE ANTERIOR RIGHT FRONTAL LOBE COMPATIBLE WITH ACUTE INFARCTION.  02/10/2016 No acute intracranial abnormality or acute abnormality of the cervical spine. Sinus disease is likely related to intubation and OG tube placement. The patient's OG tube is looped in the pharynx and mouth.  Mr Brain Wo Contrast 05/10/2016  Subacute to chronic appearing RIGHT frontoparietal lobe infarct in RIGHT MCA distribution with petechial hemorrhage. Findings less likely represent venous infarct. 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   Ct Angio head & Neck W Or Wo Contrast 05/11/2016   Progression of cytotoxic edema compared with the initial CT from 05/10/2016 in the RIGHT frontal lobe. Moderate irregularity of the A3 and A4 segments, anterior cerebral artery on the RIGHT, correlates with the observed pattern of restricted diffusion on MR; considerations would include vasculitis, intracranial atherosclerosis, or vasospasm. No intracranial large vessel occlusion or extracranial stenosis of significance. No findings to support cortical venous or dural venous thrombosis. Suspected recurrent empyema in the RIGHT chest, 9 x 10 cm cross-section with air-fluid level. Recommend CT chest with contrast for further evaluation.   2D Echocardiogram  02/25/2016 - Left ventricle: The cavity size was normal. Systolic function was normal. The estimated ejection fraction was in the range of 60% to 65%. Wall motion was normal; there were no regional wall motion abnormalities. Left ventricular diastolic function parameters were normal. -  Aortic valve: Trileaflet; mildly thickened leaflets. Valve area (VTI): 1.91 cm^2. Valve area (Vmax): 2.11 cm^2. Valve area (Vmean): 2.14 cm^2. - Right atrium: Mobile linear density seen in the right atrium.  This most likely represents a catheter. There is a bright target off of this that is highly mobile and concerning for possible thrombus or vegetation. This is not seen in all views. Recommend TEE for further evaluation is clinically indicated. - Pulmonary arteries: PA peak pressure: 39 mm Hg (S). Impressions:   The right ventricular systolic pressure was increased consistent with mild pulmonary hypertension.  TCD bubble study - negative for PFO  LE venous doppler - negative for DVT   Ct Head Wo Contrast 05/13/2016  IMPRESSION: Evolution of the right frontal lobe infarct with a new area of hemorrhage within this infarct. No hemorrhage seen elsewhere. No new infarct seen elsewhere. No midline shift. No subdural or epidural fluid collections.   EEG This is an abnormal EEG due to focal right hemispheric slowing. This is consistent with known stroke in that region. No seizures or epileptiform discharges are seen on this study.   TEE 05/14/16 all valve look fine with no vegetation, no LV or LA or LAA thrombus, normal LV function. Ascending aorta looks normal  Component     Latest Ref Rng & Units 05/11/2016  Cholesterol     0 - 200 mg/dL 161  Triglycerides     <150 mg/dL 096  HDL Cholesterol     >40 mg/dL 46  Total CHOL/HDL Ratio     RATIO 3.9  VLDL     0 - 40 mg/dL 22  LDL (calc)     0 - 99 mg/dL 045 (H)  Alpha galactosidase, serum     28.0 - 80.0 nmol/hr/mg prt 67.9  Interpretation      Comment  Director Review      Comment  Methodology      Comment  PTT Lupus Anticoagulant     0.0 - 51.9 sec 67.3 (H)  DRVVT     0.0 - 47.0 sec 68.7 (H)  Lupus Anticoag Interp      Comment:  Beta-2 Glycoprotein I Ab, IgG     0 - 20 GPI IgG units <9  Beta-2-Glycoprotein I IgM     0 - 32 GPI  IgM units <9  Beta-2-Glycoprotein I IgA     0 - 25 GPI IgA units 11  Anticardiolipin Ab,IgG,Qn     0 - 14 GPL U/mL <9  Anticardiolipin Ab,IgM,Qn     0 - 12 MPL U/mL 17 (H)  Anticardiolipin Ab,IgA,Qn     0 - 11 APL U/mL <9  Hemoglobin A1C     4.8 - 5.6 % 5.4  Mean Plasma Glucose     mg/dL 409  Homocysteine     0.0 - 15.0 umol/L 15.0  Sickle Cell Screen     Negative Negative  PTT-LA Mix     0.0 - 48.9 sec 59.5 (H)  Hexagonal Phase Phospholipid     0 - 11 sec 12 (H)  dRVVT Mix     0.0 - 47.0 sec 49.7 (H)  dRVVT Confirm     0.8 - 1.2 ratio 1.2    Assessment: As you may recall, she is a 35 y.o. African American female with PMH of admitted on 02/09/2016 after suicidal attempt. She had significant trauma with multiple fractures and underwent many surgeries with significant recovery. However on 05/10/2016 she had right ACA and MCA/ACA infarcts with petechial hemorrhage as well as evidence of severe diffuse axonal injury. CTA head and neck showed irregular right A3/A4 segments, but no carotid dissection. Repeat CT showed right frontal petechial hemorrhagic transformation, stable. DVT negative. TCD bubble study negative for PFO. Initial TTE showed right atrium bright target, highly mobile, concerning for possible thrombus/vegetation, but TEE showed all valve look fine with no vegetation, no LV or LA or LAA thrombus, normal LV function and ascending aorta. Hypercoagulable labs only showed positive anticoagulant and mildly positive HPP and cardiolipin IgM, will need to repeat in 3 months. LDL 112 and A1C 5.4. Developed new onset seizure on 05/12/16, likely due to hemorrhagic infarct. Put on vimpat 100mg  bid. EEG showed right hemisphere slowing, no seizure. She was put on ASA and lipitor on discharge to [facility] on 06/03/16. During the interval time, the patient has dramatic recovery. Her left UE back to normal and LLE still has mild paresis, but able to walk without cane. She is following with PT/OT.   On  07/06/16 pt had two episodes of word finding difficulty, lasting 30s each and 1-2 min apart. Sent to ED and had CT head negative. She also stated daily episodes of sudden onset blurry vision in both eyes,with lightheadedness. No HA, LOC, shaking or  jerking. Episodes lasts 5 sec. Need EEG to rule out seizure.   Plan:  - continue ASA and lipitor for stroke prevention - continue keppra for seizure control - repeat EEG  - repeat some prior positive hypercoagulable tests  - aggressive PT/OT for LLE weakness - follow up with psychiatry and continue zoloft - Follow up with your primary care physician for stroke risk factor modification. Recommend maintain blood pressure goal <130/80, diabetes with hemoglobin A1c goal below 7.0% and lipids with LDL cholesterol goal below 70 mg/dL.  - avoid over exertion, relaxation, cope with stress - follow up in 3 months.   I spent more than 25 minutes of face to face time with the patient. Greater than 50% of time was spent in counseling and coordination of care. We discussed EEG to rule out seizure, continue current medications, aggressive PT/OT and avoid overexertion.   Orders Placed This Encounter  Procedures  . Lupus anticoagulant  . Cardiolipin antibodies, IgM+IgG  . Hexagonal Phospholipid Neutralization  . Hexagonal Phase Phospholipid  . EEG adult    Standing Status:   Future    Standing Expiration Date:   07/27/2017    No orders of the defined types were placed in this encounter.   Patient Instructions  - continue ASA and lipitor for stroke prevention - continue keppra for seizure control - repeat EEG  - repeat blood tests  - aggressive PT/OT for LLE weakness - follow up with psychiatry and continue zoloft - Follow up with your primary care physician for stroke risk factor modification. Recommend maintain blood pressure goal <130/80, diabetes with hemoglobin A1c goal below 7.0% and lipids with LDL cholesterol goal below 70 mg/dL.  - avoid over  exertion, relaxation, cope with stress - follow up in 3 months.    [name] PlanJindong Kalya Troeger, MD PhD [facility] [address], [address] [place], [place] [address] [phone]

## 2016-07-28 NOTE — Therapy (Signed)
[facility] [address] [place], [place], [address] Phone: [phone]   Fax:  [phone]  Physical Therapy Treatment  Patient Details  Name: MAIYAH GOYNE MRN: [id number] Date of Birth: [birth year] Referring Provider: Dr. Claudette Laws  Encounter Date: 07/27/2016      PT End of Session - 07/28/16 1912    Visit Number 4   Number of Visits 24   Date for PT Re-Evaluation 08/27/16   Authorization Type Medicaid   Authorization Time Period 9-8 - 10-07-16   Authorization - Visit Number 3   Authorization - Number of Visits 24   PT Start Time 1146   PT Stop Time 1232   PT Time Calculation (min) 46 min      Past Medical History:  Diagnosis Date  . ADD (attention deficit disorder)    has been on medication  . Anemia   . Anxiety   . Depression   . H/O suicide attempt    at age 47.   Marland Kitchen PTSD (post-traumatic stress disorder)   . Seizures (HCC)   . Stroke [facility])     Past Surgical History:  Procedure Laterality Date  . APPLICATION OF WOUND VAC  02/09/2016   Procedure: APPLICATION OF WOUND VAC;  Surgeon: Jimmye Norman, MD;  Location: [facility] OR;  Service: General;;  . APPLICATION OF WOUND VAC N/A 02/16/2016   Procedure: RE-APPLICATION OF WOUND VAC;  Surgeon: [name], MD;  Location: [facility] OR;  Service: General;  Laterality: N/A;  . BOWEL RESECTION  02/09/2016   Procedure: SMALL BOWEL RESECTION, MESENTERIC REPAIR;  Surgeon: Jimmye Norman, MD;  Location: [facility] OR;  Service: General;;  . CHEST TUBE INSERTION Right 02/11/2016   Procedure: CHEST TUBE INSERTION;  Surgeon: Jimmye Norman, MD;  Location: [facility] OR;  Service: General;  Laterality: Right;  . CHEST TUBE INSERTION Right 02/26/2016   Procedure: CHEST TUBE INSERTION;  Surgeon: Kerin Perna, MD;  Location: [facility] OR;  Service: Thoracic;  Laterality: Right;  . CHOLECYSTECTOMY  02/09/2016   Procedure: CHOLECYSTECTOMY;  Surgeon: Jimmye Norman, MD;  Location: [facility] OR;  Service: General;;  .  ESOPHAGOGASTRODUODENOSCOPY N/A 02/10/2016   Procedure: ESOPHAGOGASTRODUODENOSCOPY (EGD);  Surgeon: Sherrilyn Rist, MD;  Location: [facility] ENDOSCOPY;  Service: Endoscopy;  Laterality: N/A;  . EXTERNAL FIXATION PELVIS  02/09/2016   Procedure: EXTERNAL FIXATION PELVIS;  Surgeon: Myrene Galas, MD;  Location: [facility] OR;  Service: Orthopedics;;  . HEMATOMA EVACUATION Right 05/14/2016   Procedure: EVACUATION HEMATOMA;  Surgeon: Alleen Borne, MD;  Location: [facility] OR;  Service: Thoracic;  Laterality: Right;  Evacuation of Hematoma Right Chest  . LACERATION REPAIR  02/09/2016   Procedure: REPAIR LIVER LACERATION;  Surgeon: Jimmye Norman, MD;  Location: [facility] OR;  Service: General;;  . LAPAROTOMY N/A 02/10/2016   Procedure: EXPLORATORY LAPAROTOMY, removal of packs,  cauterization of liver, repacking of liver, and open abdomen vac application;  Surgeon: [name], MD;  Location: [facility] OR;  Service: General;  Laterality: N/A;  . LAPAROTOMY N/A 02/11/2016   Procedure: EXPLORATORY LAPAROTOMY VAC CHANGE ;  Surgeon: Jimmye Norman, MD;  Location: [facility] OR;  Service: General;  Laterality: N/A;  . LAPAROTOMY N/A 02/13/2016   Procedure: EXPLORATORY LAPAROTOMY, REMOVAL OF PACKS, ABDOMINAL VAC DRESSING CHANGE;  Surgeon: [name], MD;  Location: [facility] OR;  Service: General;  Laterality: N/A;  . LAPAROTOMY N/A 02/18/2016   Procedure: EXPLORATORY LAPAROTOMY, PLACEMENT OF ABRA ABDOMINAL WALL CLOSURE SET;  Surgeon: Jimmye Norman, MD;  Location: [facility] OR;  Service: General;  Laterality: N/A;  . LAPAROTOMY N/A 02/09/2016   Procedure: EXPLORATORY LAPAROTOMY;  Surgeon: Jimmye NormanJames [name], MD;  [facility];  Service: General;  Laterality: N/A;  . LAPAROTOMY N/A 02/16/2016   Procedure: EXPLORATORY LAPAROTOMY, ABDOMINAL WASH OUT;  Surgeon: [name] Thompson, MD;  [facility];  Service: General;  Laterality: N/A;  . PERCUTANEOUS TRACHEOSTOMY N/A 03/01/2016   Procedure: PERCUTANEOUS TRACHEOSTOMY;  Surgeon: Jimmye NormanJames [name], MD;  [facility];  Service: General;   Laterality: N/A;  . SACRO-ILIAC PINNING Right 02/16/2016   Procedure: Loyal GamblerSACRO-ILIAC PINNING;  Surgeon: Myrene GalasMichael [name], MD;  [facility];  Service: Orthopedics;  Laterality: Right;  . TEE WITHOUT CARDIOVERSION N/A 05/14/2016   Procedure: TRANSESOPHAGEAL ECHOCARDIOGRAM (TEE);  Surgeon: Alleen BorneBryan K Bartle, MD;  [facility];  Service: Thoracic;  Laterality: N/A;  . THORACOTOMY/LOBECTOMY Right 04/26/2016   Procedure: Right THORACOTOMY AND DRAINAGE OF EMPYEMA;  Surgeon: Alleen BorneBryan K Bartle, MD;  Location: [facility] OR;  Service: Thoracic;  Laterality: Right;  Marland Kitchen. VIDEO ASSISTED THORACOSCOPY (VATS)/THOROCOTOMY Right 05/14/2016   Procedure: RIGHT VIDEO ASSISTED THORACOSCOPY (VATS),DRAINAGE OF EMPYEMA;  Surgeon: Alleen BorneBryan K Bartle, MD;  Location: [facility] OR;  Service: Thoracic;  Laterality: Right;  . WOUND DEBRIDEMENT N/A 03/01/2016   Procedure: ABDOMINAL WOUND CLOSURE;  Surgeon: Jimmye NormanJames [name], MD;  [facility];  Service: General;  Laterality: N/A;    There were no vitals filed for this visit.      Subjective Assessment - 07/28/16 1903    Subjective Pt reports she has had tingling under toenails and on balls of feet - started last Friday   Pertinent History Suicide attempt on 02-09-16 (fall from 5th floor hotel room) with multiple injuries sustained including TBI with LOC, multiple pelvic fractures, L wrist fracture, multiple fractures of ribs R and L sides; liver laceration:  bil. pulmonary contusion:  acute respiratory failure:  cerebral embolism with cerebral infarction and seizure sustained on 05-10-16                                                                   Patient Stated Goals "to get my toes to wiggle and to be able to walk without cane and get up out of chairs easier"   Currently in Pain? No/denies                         [facility] Treatment/Exercise - 07/28/16 0001      Ambulation/Gait   Ambulation/Gait Yes   Ambulation/Gait Assistance 5: Supervision   Ambulation/Gait Assistance Details  cues for L initial heel contact as able   Ambulation Distance (Feet) 100 Feet   Assistive device None;Other (Comment)  in // bars   Gait Pattern Decreased dorsiflexion - left;Decreased weight shift to left;Decreased step length - left;Decreased hip/knee flexion - left   Ambulation Surface Level;Indoor   Stairs Yes   Stairs Assistance 5: Supervision   Stairs Assistance Details (indicate cue type and reason) step by step   Stair Management Technique Two rails   Number of Stairs 4   Height of Stairs 6     Lumbar Exercises: Stretches   Active Hamstring Stretch 1 rep;30 seconds  LLE     Lumbar Exercises: Standing   Heel Raises 10  reps     Lumbar Exercises: Supine   Bridge 10 reps   Straight Leg Raise 10 reps   Other Supine Lumbar Exercises Bridging with hip abduction/adduction , bridging with RLE extension 5 reps each     Lumbar Exercises: Sidelying   Clam 10 reps  2#   Hip Abduction 10 reps   Other Sidelying Lumbar Exercises hip abduction with clockwise and counterclockwise circles     Knee/Hip Exercises: Aerobic   Recumbent Bike Scifit level 1.7 x 5"                  PT Short Term Goals - 06/28/16 1627      PT SHORT TERM GOAL #1   Title Improve TUG score to </= 18 secs with [facility] to demonstrate improved functional mobility.  (07-29-16)   Baseline 23.81 secs with [facility]   Time 4   Period Weeks   Status New     PT SHORT TERM GOAL #2   Title Improve gait velocity to >/= 2.0 ft/sec with [facility] for incr. gait efficiency.  (07-29-16)   Baseline 1.59 ft/sec = 20.69 secs with quad cane   Time 4   Period Weeks   Status New     PT SHORT TERM GOAL #3   Title Amb. 100' with min assist with single point cane on flat, even surface.  (07-29-16)   Baseline Pt able to amb. with [facility] with S approx. 200' on flat, even surface.   Time 4   Period Weeks   Status New     PT SHORT TERM GOAL #4   Title Increase Berg balance test score by at least 5 points to reduce fall risk.   (07-29-16)   Baseline Berg test to be completed next session - Not tested due to time constraint at evaluation   Time 4   Period Weeks   Status New     PT SHORT TERM GOAL #5   Title Independent in HEP for LLE strengthening and stretching.  (07-29-16)   Baseline Dependent    Time 4   Period Weeks   Status New           PT Long Term Goals - 06/28/16 1629      PT LONG TERM GOAL #1   Title Improve TUG score to </= 14.5 secs with [facility] to reduce fall risk.  (09-27-16)   Baseline 23.81 secs with [facility]   Time 12   Period Weeks   Status New     PT LONG TERM GOAL #2   Title Improve gait velocity to >/= 2.5 ft/sec with single point cane for incr. gait efficiency.  (09-27-16)   Baseline 1.59 ft/sec with [facility]   Time 12   Period Weeks   Status New     PT LONG TERM GOAL #3   Title Modified independent with household ambulation without device with AFO.  (09-27-16)   Baseline Pt requires use of [facility] for assist. with ambulation - with supervision for safety   Time 12   Period Weeks   Status New     PT LONG TERM GOAL #4   Title Amb. 1000' with single point cane with S on flat even and uneven surfaces for incr. community accessibility.  (09-27-16)   Baseline 200' with SBA on uneven surface with [facility]   Time 12   Period Weeks   Status New     PT LONG TERM GOAL #5   Title Verbalize understanding of options for  community exercise programs upon D/C from PT.  (09-27-16)   Baseline Dependent   Time 12   Period Weeks   Status New               Plan - 07/28/16 1913    Clinical Impression Statement Pt's gait pattern improved with visual and verbal cues - mirror used for visual feedback: pt is not wearing AFO on LLE  - able to reduce steppage gait with cues   Rehab Potential Good   PT Frequency 2x / week   PT Duration 12 weeks   PT Treatment/Interventions ADLs/Self Care Home Management;DME Instruction;Gait training;Stair training;Functional mobility training;Orthotic  Fit/Training;Patient/family education;Neuromuscular re-education;Balance training;Therapeutic exercise;Therapeutic activities   PT Next Visit Plan complete Berg;  begin HEP for LLE strengthening   PT Home Exercise Plan LLE strengthening   Consulted and Agree with Plan of Care Patient      Patient will benefit from skilled therapeutic intervention in order to improve the following deficits and impairments:  Abnormal gait, Decreased balance, Decreased coordination, Decreased endurance, Pain, Decreased mobility, Decreased strength, Decreased skin integrity, Impaired sensation, Impaired tone  Visit Diagnosis: Other abnormalities of gait and mobility  Muscle weakness (generalized)     Problem List Patient Active Problem List   Diagnosis Date Noted  . Seizures (HCC) 06/18/2016  . Hx of suicide attempt   . 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 Left hemiparesis (HCC)   . S/P thoracotomy 05/14/2016  . Cerebral embolism with cerebral infarction 05/11/2016  . 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 Multiple pelvic fractures (HCC) 03/03/2016  . Left wrist fracture 03/03/2016  . Multiple fractures of ribs of right side 03/03/2016  . Fracture of multiple ribs of left side 03/03/2016  . Liver laceration 03/03/2016  . Small intestine injury 03/03/2016  . Acute blood loss anemia 03/03/2016  . Suicide attempt (HCC) 03/03/2016  . Bilateral pulmonary contusion 03/03/2016  . Acute respiratory failure (HCC) 03/03/2016  . Hypokalemia 03/03/2016  . Hypernatremia 03/03/2016  . Acute kidney injury (HCC) 03/03/2016  . Ingestion of caustic substance 03/03/2016  . Tylenol overdose 03/03/2016  . Hyperglycemia 03/03/2016  . Hepatic failure (HCC) 03/03/2016  . Pressure ulcer 02/26/2016  . Fall from, out of or through building, not otherwise specified, initial encounter 02/09/2016  .  Hypovolemic shock (HCC) 02/09/2016    Rhodes Calvert, Donavan Burnet, PT 07/28/2016, 7:17 PM  [facility] [address] [place], [place], [address] Phone: [phone]   Fax:  [phone]  Name: KALYSTA KNEISLEY MRN: [id number] Date of Birth: Jun 11, 1981

## 2016-07-29 ENCOUNTER — Ambulatory Visit: Payer: Medicaid Other | Admitting: Physical Therapy

## 2016-07-30 ENCOUNTER — Ambulatory Visit: Payer: Medicaid Other | Admitting: Physical Therapy

## 2016-08-02 ENCOUNTER — Ambulatory Visit: Payer: Medicaid Other | Admitting: Physical Therapy

## 2016-08-02 DIAGNOSIS — M6281 Muscle weakness (generalized): Secondary | ICD-10-CM

## 2016-08-02 DIAGNOSIS — R2689 Other abnormalities of gait and mobility: Secondary | ICD-10-CM

## 2016-08-04 NOTE — Therapy (Signed)
[facility] [address] [place], [place], [address] Phone: [phone]   Fax:  [phone]  Physical Therapy Treatment  Patient Details  Name: [name] Bell MRN: [id number] Date of Birth: 12-02-1980 Referring Provider: Dr. Claudette Laws  Encounter Date: 08/02/2016      PT End of Session - 08/04/16 1341    Visit Number 5   Number of Visits 24   Date for PT Re-Evaluation 08/27/16   Authorization Type Medicaid   Authorization Time Period 9-8 - 10-07-16   Authorization - Visit Number 4   Authorization - Number of Visits 24   PT Start Time 1101   PT Stop Time 1145   PT Time Calculation (min) 44 min      Past Medical History:  Diagnosis Date  . ADD (attention deficit disorder)    has been on medication  . Anemia   . Anxiety   . Depression   . H/O suicide attempt    at age 40.   Marland Kitchen PTSD (post-traumatic stress disorder)   . Seizures (HCC)   . Stroke [facility])     Past Surgical History:  Procedure Laterality Date  . APPLICATION OF WOUND VAC  02/09/2016   Procedure: APPLICATION OF WOUND VAC;  Surgeon: Jimmye Norman, MD;  Location: [facility] OR;  Service: General;;  . APPLICATION OF WOUND VAC N/A 02/16/2016   Procedure: RE-APPLICATION OF WOUND VAC;  Surgeon: [name], MD;  Location: [facility] OR;  Service: General;  Laterality: N/A;  . BOWEL RESECTION  02/09/2016   Procedure: SMALL BOWEL RESECTION, MESENTERIC REPAIR;  Surgeon: Jimmye Norman, MD;  Location: [facility] OR;  Service: General;;  . CHEST TUBE INSERTION Right 02/11/2016   Procedure: CHEST TUBE INSERTION;  Surgeon: Jimmye Norman, MD;  Location: [facility] OR;  Service: General;  Laterality: Right;  . CHEST TUBE INSERTION Right 02/26/2016   Procedure: CHEST TUBE INSERTION;  Surgeon: Kerin Perna, MD;  Location: [facility] OR;  Service: Thoracic;  Laterality: Right;  . CHOLECYSTECTOMY  02/09/2016   Procedure: CHOLECYSTECTOMY;  Surgeon: Jimmye Norman, MD;  Location: [facility] OR;  Service: General;;  .  ESOPHAGOGASTRODUODENOSCOPY N/A 02/10/2016   Procedure: ESOPHAGOGASTRODUODENOSCOPY (EGD);  Surgeon: Sherrilyn Rist, MD;  Location: [facility] ENDOSCOPY;  Service: Endoscopy;  Laterality: N/A;  . EXTERNAL FIXATION PELVIS  02/09/2016   Procedure: EXTERNAL FIXATION PELVIS;  Surgeon: Myrene Galas, MD;  Location: [facility] OR;  Service: Orthopedics;;  . HEMATOMA EVACUATION Right 05/14/2016   Procedure: EVACUATION HEMATOMA;  Surgeon: Alleen Borne, MD;  Location: [facility] OR;  Service: Thoracic;  Laterality: Right;  Evacuation of Hematoma Right Chest  . LACERATION REPAIR  02/09/2016   Procedure: REPAIR LIVER LACERATION;  Surgeon: Jimmye Norman, MD;  Location: [facility] OR;  Service: General;;  . LAPAROTOMY N/A 02/10/2016   Procedure: EXPLORATORY LAPAROTOMY, removal of packs,  cauterization of liver, repacking of liver, and open abdomen vac application;  Surgeon: [name], MD;  Location: [facility] OR;  Service: General;  Laterality: N/A;  . LAPAROTOMY N/A 02/11/2016   Procedure: EXPLORATORY LAPAROTOMY VAC CHANGE ;  Surgeon: Jimmye Norman, MD;  Location: [facility] OR;  Service: General;  Laterality: N/A;  . LAPAROTOMY N/A 02/13/2016   Procedure: EXPLORATORY LAPAROTOMY, REMOVAL OF PACKS, ABDOMINAL VAC DRESSING CHANGE;  Surgeon: [name], MD;  Location: [facility] OR;  Service: General;  Laterality: N/A;  . LAPAROTOMY N/A 02/18/2016   Procedure: EXPLORATORY LAPAROTOMY, PLACEMENT OF ABRA ABDOMINAL WALL CLOSURE SET;  Surgeon: Jimmye Norman, MD;  Location: [facility] OR;  Service: General;  Laterality: N/A;  . LAPAROTOMY N/A 02/09/2016   Procedure: EXPLORATORY LAPAROTOMY;  Surgeon: Jimmye Norman, MD;  Location: [facility] OR;  Service: General;  Laterality: N/A;  . LAPAROTOMY N/A 02/16/2016   Procedure: EXPLORATORY LAPAROTOMY, ABDOMINAL WASH OUT;  Surgeon: [name], MD;  Location: [facility] OR;  Service: General;  Laterality: N/A;  . PERCUTANEOUS TRACHEOSTOMY N/A 03/01/2016   Procedure: PERCUTANEOUS TRACHEOSTOMY;  Surgeon: Jimmye Norman, MD;  Location: [facility] OR;  Service: General;   Laterality: N/A;  . SACRO-ILIAC PINNING Right 02/16/2016   Procedure: Loyal Gambler;  Surgeon: Myrene Galas, MD;  Location: [facility] OR;  Service: Orthopedics;  Laterality: Right;  . TEE WITHOUT CARDIOVERSION N/A 05/14/2016   Procedure: TRANSESOPHAGEAL ECHOCARDIOGRAM (TEE);  Surgeon: Alleen Borne, MD;  Location: [facility] OR;  Service: Thoracic;  Laterality: N/A;  . THORACOTOMY/LOBECTOMY Right 04/26/2016   Procedure: Right THORACOTOMY AND DRAINAGE OF EMPYEMA;  Surgeon: Alleen Borne, MD;  Location: [facility] OR;  Service: Thoracic;  Laterality: Right;  Marland Kitchen VIDEO ASSISTED THORACOSCOPY (VATS)/THOROCOTOMY Right 05/14/2016   Procedure: RIGHT VIDEO ASSISTED THORACOSCOPY (VATS),DRAINAGE OF EMPYEMA;  Surgeon: Alleen Borne, MD;  Location: [facility] OR;  Service: Thoracic;  Laterality: Right;  . WOUND DEBRIDEMENT N/A 03/01/2016   Procedure: ABDOMINAL WOUND CLOSURE;  Surgeon: Jimmye Norman, MD;  Location: [facility] OR;  Service: General;  Laterality: N/A;    There were no vitals filed for this visit.      Subjective Assessment - 08/04/16 1337    Subjective Pt reports tingling is occurring less often - pt reports feeling better today than last week   Pertinent History Suicide attempt on 02-09-16 (fall from 5th floor hotel room) with multiple injuries sustained including TBI with LOC, multiple pelvic fractures, L wrist fracture, multiple fractures of ribs R and L sides; liver laceration:  bil. pulmonary contusion:  acute respiratory failure:  cerebral embolism with cerebral infarction and seizure sustained on 05-10-16                                                                   Patient Stated Goals "to get my toes to wiggle and to be able to walk without cane and get up out of chairs easier"   Currently in Pain? No/denies                         [facility] Adult PT Treatment/Exercise - 08/04/16 0001      Lumbar Exercises: Stretches   Active Hamstring Stretch 1 rep;30 seconds  LLE     Lumbar Exercises: Machines for  Strengthening   Leg Press 50# bil. LE's 2 sets 10 reps     Lumbar Exercises: Standing   Heel Raises 10 reps     Lumbar Exercises: Sidelying   Clam 10 reps  3# weight LLE   Hip Abduction 10 reps  added clockwise and counterclockwise circles 10 reps each     Knee/Hip Exercises: Aerobic   Recumbent Bike Scifit level 1.7 x 5"     Knee/Hip Exercises: Standing   Forward Step Up Left;1 set;Step Height: 6";10 reps   Step Down Left;1 set;5 reps;Step Height: 6"   Functional Squat 1 set;10 reps  on BOSU inside // bars      Neuro  Re-ed:  LLE single limb stance activities  - standing on BOSU - moving RLE up/back and laterally to improve LLE SLS Sit to stand with R foot on balance bubble - with UE support prn - 10 reps  Stepping over and back of foam balance beam with RLE with minimal UE support Marching on blue AirEx - with UE support prn with head turns for balance/vestibular input            PT Short Term Goals - 06/28/16 1627      PT SHORT TERM GOAL #1   Title Improve TUG score to </= 18 secs with [facility] to demonstrate improved functional mobility.  (07-29-16)   Baseline 23.81 secs with [facility]   Time 4   Period Weeks   Status New     PT SHORT TERM GOAL #2   Title Improve gait velocity to >/= 2.0 ft/sec with [facility] for incr. gait efficiency.  (07-29-16)   Baseline 1.59 ft/sec = 20.69 secs with quad cane   Time 4   Period Weeks   Status New     PT SHORT TERM GOAL #3   Title Amb. 100' with min assist with single point cane on flat, even surface.  (07-29-16)   Baseline Pt able to amb. with [facility] with S approx. 200' on flat, even surface.   Time 4   Period Weeks   Status New     PT SHORT TERM GOAL #4   Title Increase Berg balance test score by at least 5 points to reduce fall risk.  (07-29-16)   Baseline Berg test to be completed next session - Not tested due to time constraint at evaluation   Time 4   Period Weeks   Status New     PT SHORT TERM GOAL #5   Title Independent in  HEP for LLE strengthening and stretching.  (07-29-16)   Baseline Dependent    Time 4   Period Weeks   Status New           PT Long Term Goals - 06/28/16 1629      PT LONG TERM GOAL #1   Title Improve TUG score to </= 14.5 secs with [facility] to reduce fall risk.  (09-27-16)   Baseline 23.81 secs with [facility]   Time 12   Period Weeks   Status New     PT LONG TERM GOAL #2   Title Improve gait velocity to >/= 2.5 ft/sec with single point cane for incr. gait efficiency.  (09-27-16)   Baseline 1.59 ft/sec with [facility]   Time 12   Period Weeks   Status New     PT LONG TERM GOAL #3   Title Modified independent with household ambulation without device with AFO.  (09-27-16)   Baseline Pt requires use of [facility] for assist. with ambulation - with supervision for safety   Time 12   Period Weeks   Status New     PT LONG TERM GOAL #4   Title Amb. 1000' with single point cane with S on flat even and uneven surfaces for incr. community accessibility.  (09-27-16)   Baseline 200' with SBA on uneven surface with [facility]   Time 12   Period Weeks   Status New     PT LONG TERM GOAL #5   Title Verbalize understanding of options for community exercise programs upon D/C from PT.  (09-27-16)   Baseline Dependent   Time 12   Period Weeks   Status  New               Plan - 08/04/16 1342    Clinical Impression Statement Pt is improving with gait and balance; pt continues to amb. without use of AFO on LLE and minimal use of quad cane noted; pt cont to have steppage gait on LLE due to weak dorsiflexors   Rehab Potential Good   PT Frequency 2x / week   PT Duration 12 weeks   PT Treatment/Interventions ADLs/Self Care Home Management;DME Instruction;Gait training;Stair training;Functional mobility training;Orthotic Fit/Training;Patient/family education;Neuromuscular re-education;Balance training;Therapeutic exercise;Therapeutic activities   PT Next Visit Plan cont LLE strengthening   PT Home Exercise  Plan LLE strengthening   Consulted and Agree with Plan of Care Patient      Patient will benefit from skilled therapeutic intervention in order to improve the following deficits and impairments:  Abnormal gait, Decreased balance, Decreased coordination, Decreased endurance, Pain, Decreased mobility, Decreased strength, Decreased skin integrity, Impaired sensation, Impaired tone  Visit Diagnosis: Muscle weakness (generalized)  Other abnormalities of gait and mobility     Problem List Patient Active Problem List   Diagnosis Date Noted  . Seizures (HCC) 06/18/2016  . Hx of suicide attempt   . 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 Left hemiparesis (HCC)   . S/P thoracotomy 05/14/2016  . Cerebral embolism with cerebral infarction 05/11/2016  . 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 Multiple pelvic fractures (HCC) 03/03/2016  . Left wrist fracture 03/03/2016  . Multiple fractures of ribs of right side 03/03/2016  . Fracture of multiple ribs of left side 03/03/2016  . Liver laceration 03/03/2016  . Small intestine injury 03/03/2016  . Acute blood loss anemia 03/03/2016  . Suicide attempt (HCC) 03/03/2016  . Bilateral pulmonary contusion 03/03/2016  . Acute respiratory failure (HCC) 03/03/2016  . Hypokalemia 03/03/2016  . Hypernatremia 03/03/2016  . Acute kidney injury (HCC) 03/03/2016  . Ingestion of caustic substance 03/03/2016  . Tylenol overdose 03/03/2016  . Hyperglycemia 03/03/2016  . Hepatic failure (HCC) 03/03/2016  . Pressure ulcer 02/26/2016  . Fall from, out of or through building, not otherwise specified, initial encounter 02/09/2016  . Hypovolemic shock (HCC) 02/09/2016    Narayan Scull, Donavan BurnetLinda Suzanne, PT 08/04/2016, 1:48 PM  [facility] [address] [place], [place], [address] Phone:  [phone]   Fax:  [phone]  Name: [name] BobKatia D Bell MRN: [id number] Date of Birth: June 05, 1981

## 2016-08-05 ENCOUNTER — Ambulatory Visit: Payer: Medicaid Other | Admitting: Physical Therapy

## 2016-08-09 ENCOUNTER — Other Ambulatory Visit: Payer: Self-pay | Admitting: Neurology

## 2016-08-09 ENCOUNTER — Ambulatory Visit: Payer: Medicaid Other | Attending: Physical Medicine & Rehabilitation | Admitting: Physical Therapy

## 2016-08-09 DIAGNOSIS — R2681 Unsteadiness on feet: Secondary | ICD-10-CM | POA: Insufficient documentation

## 2016-08-09 DIAGNOSIS — R278 Other lack of coordination: Secondary | ICD-10-CM | POA: Diagnosis present

## 2016-08-09 DIAGNOSIS — R569 Unspecified convulsions: Secondary | ICD-10-CM

## 2016-08-09 DIAGNOSIS — R2689 Other abnormalities of gait and mobility: Secondary | ICD-10-CM | POA: Diagnosis present

## 2016-08-09 DIAGNOSIS — M6281 Muscle weakness (generalized): Secondary | ICD-10-CM

## 2016-08-10 NOTE — Therapy (Signed)
[facility] [address] [place], [place], [address] Phone: [phone]   Fax:  [phone]  Physical Therapy Treatment  Patient Details  Name: [name] Bell MRN: [id number] Date of Birth: 03/16/1981 Referring Provider: Dr. Claudette Laws  Encounter Date: 08/09/2016      PT End of Session - 08/10/16 1424    Visit Number 6   Number of Visits 24   Date for PT Re-Evaluation 08/27/16   Authorization Type Medicaid   Authorization Time Period 9-8 - 10-07-16   Authorization - Visit Number 5   Authorization - Number of Visits 24   PT Start Time 1102   PT Stop Time 1147   PT Time Calculation (min) 45 min      Past Medical History:  Diagnosis Date  . ADD (attention deficit disorder)    has been on medication  . Anemia   . Anxiety   . Depression   . H/O suicide attempt    at age 37.   Marland Kitchen PTSD (post-traumatic stress disorder)   . Seizures (HCC)   . Stroke [facility])     Past Surgical History:  Procedure Laterality Date  . APPLICATION OF WOUND VAC  02/09/2016   Procedure: APPLICATION OF WOUND VAC;  Surgeon: Jimmye Norman, MD;  Location: [facility] OR;  Service: General;;  . APPLICATION OF WOUND VAC N/A 02/16/2016   Procedure: RE-APPLICATION OF WOUND VAC;  Surgeon: [name], MD;  Location: [facility] OR;  Service: General;  Laterality: N/A;  . BOWEL RESECTION  02/09/2016   Procedure: SMALL BOWEL RESECTION, MESENTERIC REPAIR;  Surgeon: Jimmye Norman, MD;  Location: [facility] OR;  Service: General;;  . CHEST TUBE INSERTION Right 02/11/2016   Procedure: CHEST TUBE INSERTION;  Surgeon: Jimmye Norman, MD;  Location: [facility] OR;  Service: General;  Laterality: Right;  . CHEST TUBE INSERTION Right 02/26/2016   Procedure: CHEST TUBE INSERTION;  Surgeon: Kerin Perna, MD;  Location: [facility] OR;  Service: Thoracic;  Laterality: Right;  . CHOLECYSTECTOMY  02/09/2016   Procedure: CHOLECYSTECTOMY;  Surgeon: Jimmye Norman, MD;  Location: [facility] OR;  Service: General;;  .  ESOPHAGOGASTRODUODENOSCOPY N/A 02/10/2016   Procedure: ESOPHAGOGASTRODUODENOSCOPY (EGD);  Surgeon: Sherrilyn Rist, MD;  Location: [facility] ENDOSCOPY;  Service: Endoscopy;  Laterality: N/A;  . EXTERNAL FIXATION PELVIS  02/09/2016   Procedure: EXTERNAL FIXATION PELVIS;  Surgeon: Myrene Galas, MD;  Location: [facility] OR;  Service: Orthopedics;;  . HEMATOMA EVACUATION Right 05/14/2016   Procedure: EVACUATION HEMATOMA;  Surgeon: Alleen Borne, MD;  Location: [facility] OR;  Service: Thoracic;  Laterality: Right;  Evacuation of Hematoma Right Chest  . LACERATION REPAIR  02/09/2016   Procedure: REPAIR LIVER LACERATION;  Surgeon: Jimmye Norman, MD;  Location: [facility] OR;  Service: General;;  . LAPAROTOMY N/A 02/10/2016   Procedure: EXPLORATORY LAPAROTOMY, removal of packs,  cauterization of liver, repacking of liver, and open abdomen vac application;  Surgeon: [name], MD;  Location: [facility] OR;  Service: General;  Laterality: N/A;  . LAPAROTOMY N/A 02/11/2016   Procedure: EXPLORATORY LAPAROTOMY VAC CHANGE ;  Surgeon: Jimmye Norman, MD;  Location: [facility] OR;  Service: General;  Laterality: N/A;  . LAPAROTOMY N/A 02/13/2016   Procedure: EXPLORATORY LAPAROTOMY, REMOVAL OF PACKS, ABDOMINAL VAC DRESSING CHANGE;  Surgeon: [name], MD;  Location: [facility] OR;  Service: General;  Laterality: N/A;  . LAPAROTOMY N/A 02/18/2016   Procedure: EXPLORATORY LAPAROTOMY, PLACEMENT OF ABRA ABDOMINAL WALL CLOSURE SET;  Surgeon: Jimmye Norman, MD;  Location: [facility] OR;  Service: General;  Laterality: N/A;  . LAPAROTOMY N/A 02/09/2016   Procedure: EXPLORATORY LAPAROTOMY;  Surgeon: Jimmye Norman, MD;  Location: [facility] OR;  Service: General;  Laterality: N/A;  . LAPAROTOMY N/A 02/16/2016   Procedure: EXPLORATORY LAPAROTOMY, ABDOMINAL WASH OUT;  Surgeon: [name], MD;  Location: [facility] OR;  Service: General;  Laterality: N/A;  . PERCUTANEOUS TRACHEOSTOMY N/A 03/01/2016   Procedure: PERCUTANEOUS TRACHEOSTOMY;  Surgeon: Jimmye Norman, MD;  Location: [facility] OR;  Service: General;   Laterality: N/A;  . SACRO-ILIAC PINNING Right 02/16/2016   Procedure: Loyal Gambler;  Surgeon: Myrene Galas, MD;  Location: [facility] OR;  Service: Orthopedics;  Laterality: Right;  . TEE WITHOUT CARDIOVERSION N/A 05/14/2016   Procedure: TRANSESOPHAGEAL ECHOCARDIOGRAM (TEE);  Surgeon: Alleen Borne, MD;  Location: [facility] OR;  Service: Thoracic;  Laterality: N/A;  . THORACOTOMY/LOBECTOMY Right 04/26/2016   Procedure: Right THORACOTOMY AND DRAINAGE OF EMPYEMA;  Surgeon: Alleen Borne, MD;  Location: [facility] OR;  Service: Thoracic;  Laterality: Right;  Marland Kitchen VIDEO ASSISTED THORACOSCOPY (VATS)/THOROCOTOMY Right 05/14/2016   Procedure: RIGHT VIDEO ASSISTED THORACOSCOPY (VATS),DRAINAGE OF EMPYEMA;  Surgeon: Alleen Borne, MD;  Location: [facility] OR;  Service: Thoracic;  Laterality: Right;  . WOUND DEBRIDEMENT N/A 03/01/2016   Procedure: ABDOMINAL WOUND CLOSURE;  Surgeon: Jimmye Norman, MD;  Location: [facility] OR;  Service: General;  Laterality: N/A;    There were no vitals filed for this visit.      Subjective Assessment - 08/10/16 1418    Subjective Pt reports she is feeling better - caught a cold from her niece last week and wasn't feeling well on Thursday so she cancelled PT appt; states she has been doing exercises at home   Pertinent History Suicide attempt on 02-09-16 (fall from 5th floor hotel room) with multiple injuries sustained including TBI with LOC, multiple pelvic fractures, L wrist fracture, multiple fractures of ribs R and L sides; liver laceration:  bil. pulmonary contusion:  acute respiratory failure:  cerebral embolism with cerebral infarction and seizure sustained on 05-10-16                                                                   How long can you sit comfortably? 30" - then start moving to try to get more comfortable   How long can you stand comfortably? 15-20"   How long can you walk comfortably? 200'   Patient Stated Goals "to get my toes to wiggle and to be able to walk without cane and get up out of  chairs easier"                         [facility] Adult PT Treatment/Exercise - 08/10/16 0001      Ambulation/Gait   Ambulation/Gait Yes   Ambulation/Gait Assistance 5: Supervision   Ambulation/Gait Assistance Details cues for L initial heel contact   Ambulation Distance (Feet) 60 Feet  inside // bars - mirror used for visual feedback   Assistive device None;Other (Comment)  in // bars   Gait Pattern Decreased dorsiflexion - left;Decreased weight shift to left;Decreased step length - left;Decreased hip/knee flexion - left   Ambulation Surface Level;Indoor     Lumbar Exercises: Machines for Strengthening   Leg Press 50# bil.  LE's 2 sets 10 reps     Lumbar Exercises: Standing   Heel Raises 10 reps  LLE only     Lumbar Exercises: Sidelying   Clam 10 reps  5# weight used on LLE 5 reps:  3# weight used for 5 reps   Hip Abduction 10 reps  added clockwise and counterclockwise circles 10 reps each     Lumbar Exercises: Quadruped   Opposite Arm/Leg Raise Right arm/Left leg;Left arm/Right leg;Other (comment)  3 reps each side     Knee/Hip Exercises: Stretches   Active Hamstring Stretch Left;1 rep;30 seconds   Gastroc Stretch Left;1 rep;30 seconds     NeuroRe-ed:  L SLS activities - stepping over and back of balance beam inside // bars - 10 reps each leg with UE support prn  Standing on Bosu - LLE in middle for improved L SLS - moving RLE up/back and laterally with UE support Standing on blue Airex - marching 10 reps each leg with UE support prn  Tall kneeling - pt performed 10 small squats in this position - without UE support            PT Short Term Goals - 06/28/16 1627      PT SHORT TERM GOAL #1   Title Improve TUG score to </= 18 secs with LBQC to demonstrate improved functional mobility.  (07-29-16)   Baseline 23.81 secs with LBQC   Time 4   Period Weeks   Status New     PT SHORT TERM GOAL #2   Title Improve gait velocity to >/= 2.0 ft/sec with  [facility] for incr. gait efficiency.  (07-29-16)   Baseline 1.59 ft/sec = 20.69 secs with quad cane   Time 4   Period Weeks   Status New     PT SHORT TERM GOAL #3   Title Amb. 100' with min assist with single point cane on flat, even surface.  (07-29-16)   Baseline Pt able to amb. with [facility] with S approx. 200' on flat, even surface.   Time 4   Period Weeks   Status New     PT SHORT TERM GOAL #4   Title Increase Berg balance test score by at least 5 points to reduce fall risk.  (07-29-16)   Baseline Berg test to be completed next session - Not tested due to time constraint at evaluation   Time 4   Period Weeks   Status New     PT SHORT TERM GOAL #5   Title Independent in HEP for LLE strengthening and stretching.  (07-29-16)   Baseline Dependent    Time 4   Period Weeks   Status New           PT Long Term Goals - 06/28/16 1629      PT LONG TERM GOAL #1   Title Improve TUG score to </= 14.5 secs with LBQC to reduce fall risk.  (09-27-16)   Baseline 23.81 secs with LBQC   Time 12   Period Weeks   Status New     PT LONG TERM GOAL #2   Title Improve gait velocity to >/= 2.5 ft/sec with single point cane for incr. gait efficiency.  (09-27-16)   Baseline 1.59 ft/sec with [facility]   Time 12   Period Weeks   Status New     PT LONG TERM GOAL #3   Title Modified independent with household ambulation without device with AFO.  (09-27-16)   Baseline Pt requires use  of [facility] for assist. with ambulation - with supervision for safety   Time 12   Period Weeks   Status New     PT LONG TERM GOAL #4   Title Amb. 1000' with single point cane with S on flat even and uneven surfaces for incr. community accessibility.  (09-27-16)   Baseline 200' with SBA on uneven surface with LBQC   Time 12   Period Weeks   Status New     PT LONG TERM GOAL #5   Title Verbalize understanding of options for community exercise programs upon D/C from PT.  (09-27-16)   Baseline Dependent   Time 12   Period  Weeks   Status New               Plan - 08/10/16 1425    Clinical Impression Statement Pt increasing strength in trunk musc. and also in LLE:  pt able to transfer from prone position into quadriped position for first time today - had some discomfort in L wrist in this position due to s/p L wrist fracture in accident; pt continues to have steppage gait pattern but deviations are decreased with visual and verbal cues   Rehab Potential Good   PT Frequency 2x / week   PT Duration 12 weeks   PT Treatment/Interventions ADLs/Self Care Home Management;DME Instruction;Gait training;Stair training;Functional mobility training;Orthotic Fit/Training;Patient/family education;Neuromuscular re-education;Balance training;Therapeutic exercise;Therapeutic activities   PT Next Visit Plan cont LLE strengthening; check STG's   PT Home Exercise Plan LLE strengthening   Consulted and Agree with Plan of Care Patient      Patient will benefit from skilled therapeutic intervention in order to improve the following deficits and impairments:  Abnormal gait, Decreased balance, Decreased coordination, Decreased endurance, Pain, Decreased mobility, Decreased strength, Decreased skin integrity, Impaired sensation, Impaired tone  Visit Diagnosis: Other abnormalities of gait and mobility  Muscle weakness (generalized)     Problem List Patient Active Problem List   Diagnosis Date Noted  . Seizures (HCC) 06/18/2016  . Hx of suicide attempt   . 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 Left hemiparesis (HCC)   . S/P thoracotomy 05/14/2016  . Cerebral embolism with cerebral infarction 05/11/2016  . 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 Multiple pelvic fractures (HCC) 03/03/2016  . Left wrist fracture 03/03/2016  . Multiple fractures of ribs of right side 03/03/2016  . Fracture of multiple ribs of  left side 03/03/2016  . Liver laceration 03/03/2016  . Small intestine injury 03/03/2016  . Acute blood loss anemia 03/03/2016  . Suicide attempt 03/03/2016  . Bilateral pulmonary contusion 03/03/2016  . Acute respiratory failure (HCC) 03/03/2016  . Hypokalemia 03/03/2016  . Hypernatremia 03/03/2016  . Acute kidney injury (HCC) 03/03/2016  . Ingestion of caustic substance 03/03/2016  . Tylenol overdose 03/03/2016  . Hyperglycemia 03/03/2016  . Hepatic failure (HCC) 03/03/2016  . Pressure ulcer 02/26/2016  . Fall from, out of or through building, not otherwise specified, initial encounter 02/09/2016  . Hypovolemic shock (HCC) 02/09/2016    Yobany Vroom, Donavan Burnet, PT 08/10/2016, 2:30 PM  [place] [facility] [address] [place], [place], [address] Phone: [phone]   Fax:  [phone]  Name: [name] Bell MRN: [id number] Date of Birth: Nov 29, 1980

## 2016-08-12 ENCOUNTER — Ambulatory Visit: Payer: Medicaid Other | Admitting: Physical Therapy
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
## 2016-08-16 ENCOUNTER — Encounter (HOSPITAL_COMMUNITY): Payer: Self-pay | Admitting: Psychiatry

## 2016-08-16 ENCOUNTER — Ambulatory Visit (INDEPENDENT_AMBULATORY_CARE_PROVIDER_SITE_OTHER): Payer: Medicaid Other | Admitting: Psychiatry

## 2016-08-16 VITALS — BP 122/70 | HR 68 | Ht 72.0 in | Wt 207.6 lb

## 2016-08-16 DIAGNOSIS — Z7982 Long term (current) use of aspirin: Secondary | ICD-10-CM

## 2016-08-16 DIAGNOSIS — F332 Major depressive disorder, recurrent severe without psychotic features: Secondary | ICD-10-CM | POA: Diagnosis not present

## 2016-08-16 DIAGNOSIS — G8194 Hemiplegia, unspecified affecting left nondominant side: Secondary | ICD-10-CM

## 2016-08-16 DIAGNOSIS — Z823 Family history of stroke: Secondary | ICD-10-CM | POA: Diagnosis not present

## 2016-08-16 DIAGNOSIS — Z8249 Family history of ischemic heart disease and other diseases of the circulatory system: Secondary | ICD-10-CM | POA: Diagnosis not present

## 2016-08-16 DIAGNOSIS — S069X3S Unspecified intracranial injury with loss of consciousness of 1 hour to 5 hours 59 minutes, sequela: Secondary | ICD-10-CM

## 2016-08-16 DIAGNOSIS — Z79899 Other long term (current) drug therapy: Secondary | ICD-10-CM

## 2016-08-16 MED ORDER — SERTRALINE HCL 100 MG PO TABS: 100.0000 mg | ORAL_TABLET | Freq: Every day | ORAL | 1 refills | Status: DC

## 2016-08-16 MED ORDER — SERTRALINE HCL 100 MG PO TABS: 100.0000 mg | ORAL_TABLET | Freq: Every day | ORAL | 3 refills | Status: DC

## 2016-08-16 NOTE — Patient Instructions (Addendum)
1. Continue sertraline 100 mg daily 2. Return to clinic in one month 3. Will make a referral for a therapist

## 2016-08-17 ENCOUNTER — Ambulatory Visit: Payer: Medicaid Other | Admitting: Physical Therapy

## 2016-08-17 DIAGNOSIS — M6281 Muscle weakness (generalized): Secondary | ICD-10-CM

## 2016-08-17 DIAGNOSIS — R2689 Other abnormalities of gait and mobility: Secondary | ICD-10-CM

## 2016-08-18 ENCOUNTER — Ambulatory Visit (INDEPENDENT_AMBULATORY_CARE_PROVIDER_SITE_OTHER): Payer: Medicaid Other | Admitting: Neurology

## 2016-08-18 DIAGNOSIS — I63421 Cerebral infarction due to embolism of right anterior cerebral artery: Secondary | ICD-10-CM

## 2016-08-18 DIAGNOSIS — R569 Unspecified convulsions: Secondary | ICD-10-CM | POA: Diagnosis not present

## 2016-08-18 NOTE — Therapy (Signed)
[facility] [address] [place], [place], [address] Phone: [phone]   Fax:  [phone]  Physical Therapy Treatment  Patient Details  Name: [name] Bell MRN: [id number] Date of Birth: October 12, 1981 Referring Provider: Dr. Claudette Laws  Encounter Date: 08/17/2016      PT End of Session - 08/18/16 1127    Visit Number 7   Number of Visits 24   Date for PT Re-Evaluation 08/27/16   Authorization Type Medicaid   Authorization Time Period 9-8 - 10-07-16   Authorization - Visit Number 6   Authorization - Number of Visits 24   PT Start Time 1316   PT Stop Time 1402   PT Time Calculation (min) 46 min      Past Medical History:  Diagnosis Date  . ADD (attention deficit disorder)    has been on medication  . ADHD (attention deficit hyperactivity disorder)   . Anemia   . Anxiety   . Depression   . H/O suicide attempt    at age 89.   Marland Kitchen PTSD (post-traumatic stress disorder)   . Seizures (HCC)   . Stroke [facility])     Past Surgical History:  Procedure Laterality Date  . APPLICATION OF WOUND VAC  02/09/2016   Procedure: APPLICATION OF WOUND VAC;  Surgeon: Jimmye Norman, MD;  Location: [facility] OR;  Service: General;;  . APPLICATION OF WOUND VAC N/A 02/16/2016   Procedure: RE-APPLICATION OF WOUND VAC;  Surgeon: [name], MD;  Location: [facility] OR;  Service: General;  Laterality: N/A;  . BOWEL RESECTION  02/09/2016   Procedure: SMALL BOWEL RESECTION, MESENTERIC REPAIR;  Surgeon: Jimmye Norman, MD;  Location: [facility] OR;  Service: General;;  . CHEST TUBE INSERTION Right 02/11/2016   Procedure: CHEST TUBE INSERTION;  Surgeon: Jimmye Norman, MD;  Location: [facility] OR;  Service: General;  Laterality: Right;  . CHEST TUBE INSERTION Right 02/26/2016   Procedure: CHEST TUBE INSERTION;  Surgeon: Kerin Perna, MD;  Location: [facility] OR;  Service: Thoracic;  Laterality: Right;  . CHOLECYSTECTOMY  02/09/2016   Procedure: CHOLECYSTECTOMY;  Surgeon: Jimmye Norman, MD;  Location: [facility] OR;  Service: General;;  . ESOPHAGOGASTRODUODENOSCOPY N/A 02/10/2016   Procedure: ESOPHAGOGASTRODUODENOSCOPY (EGD);  Surgeon: Sherrilyn Rist, MD;  Location: [facility] ENDOSCOPY;  Service: Endoscopy;  Laterality: N/A;  . EXTERNAL FIXATION PELVIS  02/09/2016   Procedure: EXTERNAL FIXATION PELVIS;  Surgeon: Myrene Galas, MD;  Location: [facility] OR;  Service: Orthopedics;;  . HEMATOMA EVACUATION Right 05/14/2016   Procedure: EVACUATION HEMATOMA;  Surgeon: Alleen Borne, MD;  Location: [facility] OR;  Service: Thoracic;  Laterality: Right;  Evacuation of Hematoma Right Chest  . LACERATION REPAIR  02/09/2016   Procedure: REPAIR LIVER LACERATION;  Surgeon: Jimmye Norman, MD;  Location: [facility] OR;  Service: General;;  . LAPAROTOMY N/A 02/10/2016   Procedure: EXPLORATORY LAPAROTOMY, removal of packs,  cauterization of liver, repacking of liver, and open abdomen vac application;  Surgeon: [name], MD;  Location: [facility] OR;  Service: General;  Laterality: N/A;  . LAPAROTOMY N/A 02/11/2016   Procedure: EXPLORATORY LAPAROTOMY VAC CHANGE ;  Surgeon: Jimmye Norman, MD;  Location: [facility] OR;  Service: General;  Laterality: N/A;  . LAPAROTOMY N/A 02/13/2016   Procedure: EXPLORATORY LAPAROTOMY, REMOVAL OF PACKS, ABDOMINAL VAC DRESSING CHANGE;  Surgeon: [name], MD;  Location: [facility] OR;  Service: General;  Laterality: N/A;  . LAPAROTOMY N/A 02/18/2016   Procedure: EXPLORATORY LAPAROTOMY, PLACEMENT OF ABRA ABDOMINAL WALL CLOSURE SET;  Surgeon:  Jimmye Norman, MD;  Location: [facility] OR;  Service: General;  Laterality: N/A;  . LAPAROTOMY N/A 02/09/2016   Procedure: EXPLORATORY LAPAROTOMY;  Surgeon: Jimmye Norman, MD;  Location: [facility] OR;  Service: General;  Laterality: N/A;  . LAPAROTOMY N/A 02/16/2016   Procedure: EXPLORATORY LAPAROTOMY, ABDOMINAL WASH OUT;  Surgeon: [name], MD;  Location: [facility] OR;  Service: General;  Laterality: N/A;  . PERCUTANEOUS TRACHEOSTOMY N/A 03/01/2016   Procedure: PERCUTANEOUS TRACHEOSTOMY;  Surgeon: Jimmye Norman, MD;  Location: [facility] OR;  Service: General;  Laterality: N/A;  . SACRO-ILIAC PINNING Right 02/16/2016   Procedure: Loyal Gambler;  Surgeon: Myrene Galas, MD;  Location: [facility] OR;  Service: Orthopedics;  Laterality: Right;  . TEE WITHOUT CARDIOVERSION N/A 05/14/2016   Procedure: TRANSESOPHAGEAL ECHOCARDIOGRAM (TEE);  Surgeon: Alleen Borne, MD;  Location: [facility] OR;  Service: Thoracic;  Laterality: N/A;  . THORACOTOMY/LOBECTOMY Right 04/26/2016   Procedure: Right THORACOTOMY AND DRAINAGE OF EMPYEMA;  Surgeon: Alleen Borne, MD;  Location: [facility] OR;  Service: Thoracic;  Laterality: Right;  Marland Kitchen VIDEO ASSISTED THORACOSCOPY (VATS)/THOROCOTOMY Right 05/14/2016   Procedure: RIGHT VIDEO ASSISTED THORACOSCOPY (VATS),DRAINAGE OF EMPYEMA;  Surgeon: Alleen Borne, MD;  Location: [facility] OR;  Service: Thoracic;  Laterality: Right;  . WOUND DEBRIDEMENT N/A 03/01/2016   Procedure: ABDOMINAL WOUND CLOSURE;  Surgeon: Jimmye Norman, MD;  Location: [facility] OR;  Service: General;  Laterality: N/A;    There were no vitals filed for this visit.      Subjective Assessment - 08/18/16 1113    Subjective Pt reports she is not feeling very well today - is under stress due to family issues - has been feeling down since last Thursday and has not been doing exercises since then   Pertinent History Suicide attempt on 02-09-16 (fall from 5th floor hotel room) with multiple injuries sustained including TBI with LOC, multiple pelvic fractures, L wrist fracture, multiple fractures of ribs R and L sides; liver laceration:  bil. pulmonary contusion:  acute respiratory failure:  cerebral embolism with cerebral infarction and seizure sustained on 05-10-16                                                                   Patient Stated Goals "to get my toes to wiggle and to be able to walk without cane and get up out of chairs easier"   Currently in Pain? No/denies                         [facility] Adult PT Treatment/Exercise - 08/18/16  0001      Ambulation/Gait   Ambulation/Gait Yes   Ambulation/Gait Assistance 5: Supervision   Ambulation/Gait Assistance Details steppage gait LLE due to weak dorsiflexors and plantarflexors   Ambulation Distance (Feet) 120 Feet   Assistive device Straight cane  tripod base   Gait Pattern Decreased dorsiflexion - left;Decreased weight shift to left;Decreased step length - left;Decreased hip/knee flexion - left   Ambulation Surface Level;Indoor   Gait velocity 2.15 (15.10 secs)     Lumbar Exercises: Aerobic   Elliptical 2" forward level 1.5     Lumbar Exercises: Standing   Heel Raises 10 reps  LLE only     Lumbar Exercises: Sidelying   Clam  20 reps  with 5# weight   Hip Abduction 20 reps  added clockwise and counterclockwise circles 10 reps each     Knee/Hip Exercises: Standing   Forward Step Up Left;1 set;Step Height: 6";10 reps   Step Down Left;1 set;5 reps;Step Height: 6"   Functional Squat 1 set;10 reps  on Bosu   SLS on Bosu - moving each leg up/back and laterally     NeuroRe-ed; standing on Bosu for L single limb stance - moving opposite leg up/back and side to side with UE support prn  TUG score 13.88 secs with cane              PT Short Term Goals - 08/18/16 1115      PT SHORT TERM GOAL #1   Title Improve TUG score to </= 18 secs with LBQC to demonstrate improved functional mobility.  (07-29-16)   Baseline 13.88 with no device - on 08-17-16   Time 4   Period Weeks   Status Achieved     PT SHORT TERM GOAL #2   Title Improve gait velocity to >/= 2.0 ft/sec with [facility] for incr. gait efficiency.  (07-29-16)   Baseline 15.10 secs with tripod based  = 2.15 ft/sec with cane   Time 4   Period Weeks   Status Achieved     PT SHORT TERM GOAL #3   Title Amb. 100' with min assist with single point cane on flat, even surface.  (07-29-16)   Baseline Pt able to amb. with cane 120' with SBA  (08-17-16)   Time 4   Period Weeks   Status Achieved     PT SHORT  TERM GOAL #4   Title Increase Berg balance test score by at least 5 points to reduce fall risk.  (07-29-16)   Time 4   Period Weeks   Status On-going     PT SHORT TERM GOAL #5   Title Independent in HEP for LLE strengthening and stretching.  (07-29-16)   Baseline Independent (met 08-17-16)   Time 4   Period Weeks   Status Achieved           PT Long Term Goals - 06/28/16 1629      PT LONG TERM GOAL #1   Title Improve TUG score to </= 14.5 secs with LBQC to reduce fall risk.  (09-27-16)   Baseline 23.81 secs with LBQC   Time 12   Period Weeks   Status New     PT LONG TERM GOAL #2   Title Improve gait velocity to >/= 2.5 ft/sec with single point cane for incr. gait efficiency.  (09-27-16)   Baseline 1.59 ft/sec with [facility]   Time 12   Period Weeks   Status New     PT LONG TERM GOAL #3   Title Modified independent with household ambulation without device with AFO.  (09-27-16)   Baseline Pt requires use of LBQC for assist. with ambulation - with supervision for safety   Time 12   Period Weeks   Status New     PT LONG TERM GOAL #4   Title Amb. 1000' with single point cane with S on flat even and uneven surfaces for incr. community accessibility.  (09-27-16)   Baseline 200' with SBA on uneven surface with LBQC   Time 12   Period Weeks   Status New     PT LONG TERM GOAL #5   Title Verbalize understanding of options for community exercise programs upon  D/C from PT.  (09-27-16)   Baseline Dependent   Time 12   Period Weeks   Status New               Plan - 08/18/16 1128    Clinical Impression Statement Pt has met STG's #1-3 and 5:  #4 - Berg test to be assessed next session; progressing well; pt does continue to exhibit steppage gait due to weak L dorsiflexors and plantarflexors; pt performed elliptical exercise for 1st time today   Rehab Potential Good   PT Frequency 2x / week   PT Duration 12 weeks   PT Treatment/Interventions ADLs/Self Care Home  Management;DME Instruction;Gait training;Stair training;Functional mobility training;Orthotic Fit/Training;Patient/family education;Neuromuscular re-education;Balance training;Therapeutic exercise;Therapeutic activities   PT Next Visit Plan cont LLE strengthening   PT Home Exercise Plan LLE strengthening   Consulted and Agree with Plan of Care Patient      Patient will benefit from skilled therapeutic intervention in order to improve the following deficits and impairments:  Abnormal gait, Decreased balance, Decreased coordination, Decreased endurance, Pain, Decreased mobility, Decreased strength, Decreased skin integrity, Impaired sensation, Impaired tone  Visit Diagnosis: Other abnormalities of gait and mobility  Muscle weakness (generalized)     Problem List Patient Active Problem List   Diagnosis Date Noted  . Seizures ([place]) 06/18/2016  . Hx of suicide attempt   . Traumatic brain injury with loss of consciousness of 1 hour to 5 hours 59 minutes ([place]) 06/03/2016  . Left hemiparesis ([place])   . S/P thoracotomy 05/14/2016  . Cerebral embolism with cerebral infarction 05/11/2016  . Major depressive disorder, recurrent severe without psychotic features ([place]) 04/12/2016  . Multiple pelvic fractures ([place]) 03/03/2016  . Left wrist fracture 03/03/2016  . Multiple fractures of ribs of right side 03/03/2016  . Fracture of multiple ribs of left side 03/03/2016  . Liver laceration 03/03/2016  . Small intestine injury 03/03/2016  . Acute blood loss anemia 03/03/2016  . Suicide attempt 03/03/2016  . Bilateral pulmonary contusion 03/03/2016  . Acute respiratory failure ([place]) 03/03/2016  . Hypokalemia 03/03/2016  . Hypernatremia 03/03/2016  . Acute kidney injury ([place]) 03/03/2016  . Ingestion of caustic substance 03/03/2016  . Tylenol overdose 03/03/2016  . Hyperglycemia 03/03/2016  . Hepatic failure ([place]) 03/03/2016  . Pressure ulcer 02/26/2016  . Fall from, out of or through building,  not otherwise specified, initial encounter 02/09/2016  . Hypovolemic shock ([place]) 02/09/2016    Joydan Gretzinger, [place], PT 08/18/2016, 11:32 AM  [facility] [address] [place], [place], [address] Phone: [phone]   Fax:  [phone]  Name: ADNA NOFZIGER MRN: [id number] Date of Birth: 04-08-1981

## 2016-08-18 NOTE — Procedures (Signed)
    History:  [name] PenmanKatia Bell is a 35 year old patient with a history of a prior suicide attempt on 02/09/2016. On 05/10/2016 she developed aphasia with decreased movements on the left side. The patient had a right anterior cerebral artery and middle cerebral artery stroke event. The patient had a seizure around this time, was treated with Ativan and Vimpat. The patient is being evaluated for this. She has had 2 episodes of word finding problems lasting about 30 seconds that occurred on 07/06/2016.  This is a routine EEG. No skull defects are noted. Medications include aspirin, Lipitor, iron supplementation, Keppra, Zoloft, Ultram, and multivitamins.   EEG classification: Normal awake  Description of the recording: The background rhythms of this recording consists of a fairly well modulated medium amplitude alpha rhythm of 9 Hz that is reactive to eye opening and closure. As the record progresses, the patient appears to remain in the waking state throughout the recording. Photic stimulation was performed, resulting in a bilateral and symmetric photic driving response. Hyperventilation was not performed. At no time during the recording does there appear to be evidence of spike or spike wave discharges or evidence of focal slowing. EKG monitor shows no evidence of cardiac rhythm abnormalities with a heart rate of 72.  Impression: This is a normal EEG recording in the waking state. No evidence of ictal or interictal discharges are seen.

## 2016-08-23 ENCOUNTER — Telehealth: Payer: Self-pay

## 2016-08-23 ENCOUNTER — Ambulatory Visit: Payer: Medicaid Other | Admitting: Physical Therapy

## 2016-08-23 NOTE — Telephone Encounter (Signed)
RN call patient about her EEG results. Rn stated the EEG was normal. Pt verbalized understanding.

## 2016-08-23 NOTE — Telephone Encounter (Signed)
Patient returned Katrina's call °

## 2016-08-23 NOTE — Telephone Encounter (Signed)
LFt vm for patient to call back about EEG results. 

## 2016-08-23 NOTE — Telephone Encounter (Signed)
-----   Message from Marvel PlanJindong Xu, MD sent at 08/18/2016  5:53 PM EDT ----- Could you please let the patient know that the EEG test done recently in our office was negative for seizure. Please continue current treatment. Thanks.  Marvel PlanJindong Xu, MD PhD Stroke Neurology 08/18/2016 5:53 PM

## 2016-08-25 ENCOUNTER — Ambulatory Visit: Payer: Medicaid Other | Admitting: Physical Therapy

## 2016-08-26 ENCOUNTER — Ambulatory Visit: Payer: Medicaid Other | Admitting: Physical Therapy

## 2016-08-26 DIAGNOSIS — R278 Other lack of coordination: Secondary | ICD-10-CM

## 2016-08-26 DIAGNOSIS — R2681 Unsteadiness on feet: Secondary | ICD-10-CM

## 2016-08-26 DIAGNOSIS — M6281 Muscle weakness (generalized): Secondary | ICD-10-CM

## 2016-08-26 DIAGNOSIS — R2689 Other abnormalities of gait and mobility: Secondary | ICD-10-CM | POA: Diagnosis not present

## 2016-08-26 NOTE — Therapy (Signed)
[facility] [address] [place], [place], [address] Phone: [phone]   Fax:  [phone]  Physical Therapy Treatment  Patient Details  Name: [name] Bell MRN: [id number] Date of Birth: 1980/11/20 Referring Provider: Dr. Claudette Laws  Encounter Date: 08/26/2016      PT End of Session - 08/26/16 1320    Visit Number 8   Number of Visits 24   Date for PT Re-Evaluation 08/27/16   Authorization Type Medicaid   Authorization Time Period 9-8 - 10-07-16   Authorization - Visit Number 7   Authorization - Number of Visits 24   PT Start Time 0806   PT Stop Time 0847   PT Time Calculation (min) 41 min   Activity Tolerance Patient tolerated treatment well   Behavior During Therapy [facility] for tasks assessed/performed      Past Medical History:  Diagnosis Date  . ADD (attention deficit disorder)    has been on medication  . ADHD (attention deficit hyperactivity disorder)   . Anemia   . Anxiety   . Depression   . H/O suicide attempt    at age 85.   Marland Kitchen PTSD (post-traumatic stress disorder)   . Seizures (HCC)   . Stroke [facility])     Past Surgical History:  Procedure Laterality Date  . APPLICATION OF WOUND VAC  02/09/2016   Procedure: APPLICATION OF WOUND VAC;  Surgeon: Jimmye Norman, MD;  Location: [facility] OR;  Service: General;;  . APPLICATION OF WOUND VAC N/A 02/16/2016   Procedure: RE-APPLICATION OF WOUND VAC;  Surgeon: [name], MD;  Location: [facility] OR;  Service: General;  Laterality: N/A;  . BOWEL RESECTION  02/09/2016   Procedure: SMALL BOWEL RESECTION, MESENTERIC REPAIR;  Surgeon: Jimmye Norman, MD;  Location: [facility] OR;  Service: General;;  . CHEST TUBE INSERTION Right 02/11/2016   Procedure: CHEST TUBE INSERTION;  Surgeon: Jimmye Norman, MD;  Location: [facility] OR;  Service: General;  Laterality: Right;  . CHEST TUBE INSERTION Right 02/26/2016   Procedure: CHEST TUBE INSERTION;  Surgeon: Kerin Perna, MD;  Location: [facility] OR;   Service: Thoracic;  Laterality: Right;  . CHOLECYSTECTOMY  02/09/2016   Procedure: CHOLECYSTECTOMY;  Surgeon: Jimmye Norman, MD;  Location: [facility] OR;  Service: General;;  . ESOPHAGOGASTRODUODENOSCOPY N/A 02/10/2016   Procedure: ESOPHAGOGASTRODUODENOSCOPY (EGD);  Surgeon: Sherrilyn Rist, MD;  Location: [facility] ENDOSCOPY;  Service: Endoscopy;  Laterality: N/A;  . EXTERNAL FIXATION PELVIS  02/09/2016   Procedure: EXTERNAL FIXATION PELVIS;  Surgeon: Myrene Galas, MD;  Location: [facility] OR;  Service: Orthopedics;;  . HEMATOMA EVACUATION Right 05/14/2016   Procedure: EVACUATION HEMATOMA;  Surgeon: Alleen Borne, MD;  Location: [facility] OR;  Service: Thoracic;  Laterality: Right;  Evacuation of Hematoma Right Chest  . LACERATION REPAIR  02/09/2016   Procedure: REPAIR LIVER LACERATION;  Surgeon: Jimmye Norman, MD;  Location: [facility] OR;  Service: General;;  . LAPAROTOMY N/A 02/10/2016   Procedure: EXPLORATORY LAPAROTOMY, removal of packs,  cauterization of liver, repacking of liver, and open abdomen vac application;  Surgeon: [name], MD;  Location: [facility] OR;  Service: General;  Laterality: N/A;  . LAPAROTOMY N/A 02/11/2016   Procedure: EXPLORATORY LAPAROTOMY VAC CHANGE ;  Surgeon: Jimmye Norman, MD;  [facility] OR;  Service: General;  Laterality: N/A;  . LAPAROTOMY N/A 02/13/2016   Procedure: EXPLORATORY LAPAROTOMY, REMOVAL OF PACKS, ABDOMINAL VAC DRESSING CHANGE;  Surgeon: [name], MD;  Location: [facility] OR;  Service: General;  Laterality: N/A;  .  LAPAROTOMY N/A 02/18/2016   Procedure: EXPLORATORY LAPAROTOMY, PLACEMENT OF ABRA ABDOMINAL WALL CLOSURE SET;  Surgeon: Judeth Horn, MD;  Location: [place];  Service: General;  Laterality: N/A;  . LAPAROTOMY N/A 02/09/2016   Procedure: EXPLORATORY LAPAROTOMY;  Surgeon: Judeth Horn, MD;  Location: [place];  Service: General;  Laterality: N/A;  . LAPAROTOMY N/A 02/16/2016   Procedure: EXPLORATORY LAPAROTOMY, ABDOMINAL [place];  Surgeon: Georganna Skeans, MD;  Location: [place];  Service: General;   Laterality: N/A;  . PERCUTANEOUS TRACHEOSTOMY N/A 03/01/2016   Procedure: PERCUTANEOUS TRACHEOSTOMY;  Surgeon: Judeth Horn, MD;  Location: [place];  Service: General;  Laterality: N/A;  Chauncey Mann PINNING Right 02/16/2016   Procedure: Dub Mikes;  Surgeon: [place]e [place], MD;  Location: [place];  Service: Orthopedics;  Laterality: Right;  . TEE WITHOUT CARDIOVERSION N/A 05/14/2016   Procedure: TRANSESOPHAGEAL ECHOCARDIOGRAM (TEE);  Surgeon: Gaye Pollack, MD;  Location: [facility] OR;  Service: Thoracic;  Laterality: N/A;  . THORACOTOMY/LOBECTOMY Right 04/26/2016   Procedure: Right THORACOTOMY AND DRAINAGE OF EMPYEMA;  Surgeon: Gaye Pollack, MD;  Location: [place];  Service: Thoracic;  Laterality: Right;  Marland Kitchen VIDEO ASSISTED THORACOSCOPY (VATS)/THOROCOTOMY Right 05/14/2016   Procedure: RIGHT VIDEO ASSISTED THORACOSCOPY (VATS),DRAINAGE OF EMPYEMA;  Surgeon: Gaye Pollack, MD;  Location: [place];  Service: Thoracic;  Laterality: Right;  . WOUND DEBRIDEMENT N/A 03/01/2016   Procedure: ABDOMINAL WOUND CLOSURE;  Surgeon: Judeth Horn, MD;  Location: [place];  Service: General;  Laterality: N/A;    There were no vitals filed for this visit.      Subjective Assessment - 08/26/16 0809    Subjective Pt reports being able to do a flight of steps without issues.  Not using cane any more.   Pertinent History Suicide attempt on 02-09-16 (fall from 5th floor hotel room) with multiple injuries sustained including TBI with LOC, multiple pelvic fractures, L wrist fracture, multiple fractures of ribs R and L sides; liver laceration:  bil. pulmonary contusion:  acute respiratory failure:  cerebral embolism with cerebral infarction and seizure sustained on 05-10-16                                                                   How long can you sit comfortably? 30" - then start moving to try to get more comfortable   How long can you stand comfortably? 15-20"   How long can you walk comfortably? 200'   Patient Stated Goals "to  get my toes to wiggle and to be able to walk without cane and get up out of chairs easier"   Currently in Pain? No/denies                         [facility] Adult PT Treatment/Exercise - 08/26/16 0001      Ambulation/Gait   Ambulation/Gait Yes   Ambulation/Gait Assistance 5: Supervision   Ambulation Distance (Feet) 110 Feet  x 5   Assistive device None   Gait Pattern Decreased dorsiflexion - left;Decreased weight shift to left;Decreased step length - left;Decreased hip/knee flexion - left   Ambulation Surface Level;Indoor   Gait Comments Pt continues with steppage gait due to decreased dorsiflexion and poor plantarflexion     Lumbar Exercises: Aerobic  Elliptical 2"forward and 1"backward level 1.5     Lumbar Exercises: Machines for Strengthening   Leg Press 50# bil LE x 2 sets of 10 reps;LLE only 30# x 10 reps x 2;needs cues for full extension and min assist to contol weights returning to start     Knee/Hip Exercises: Standing   Step Down Left;1 set;5 reps;Step Height: 6"     Taps to 6", 12", 6", floor with intermittent UE support x 10 reps bil sides        Balance Exercises - 08/26/16 1314      Balance Exercises: Standing   Rockerboard Anterior/posterior;Lateral;Head turns;EO;Other reps (comment);Intermittent UE support  >30 reps   Step Ups Forward;Lateral;6 inch;UE support 1   Heel Raises Limitations unable to perform complete range on L with heel raise when isolating only using LLE.  15 reps on L only then 15 reps bilaterally   Other Standing Exercises BOSU on black platform surface with intermittent UE support while rocking side to side;squats x 10 x 2 with intermittent UE support             PT Short Term Goals - 08/18/16 1115      PT SHORT TERM GOAL #1   Title Improve TUG score to </= 18 secs with LBQC to demonstrate improved functional mobility.  (07-29-16)   Baseline 13.88 with no device - on 08-17-16   Time 4   Period Weeks   Status Achieved      PT SHORT TERM GOAL #2   Title Improve gait velocity to >/= 2.0 ft/sec with [facility] for incr. gait efficiency.  (07-29-16)   Baseline 15.10 secs with tripod based  = 2.15 ft/sec with cane   Time 4   Period Weeks   Status Achieved     PT SHORT TERM GOAL #3   Title Amb. 100' with min assist with single point cane on flat, even surface.  (07-29-16)   Baseline Pt able to amb. with cane 120' with SBA  (08-17-16)   Time 4   Period Weeks   Status Achieved     PT SHORT TERM GOAL #4   Title Increase Berg balance test score by at least 5 points to reduce fall risk.  (07-29-16)   Time 4   Period Weeks   Status On-going     PT SHORT TERM GOAL #5   Title Independent in HEP for LLE strengthening and stretching.  (07-29-16)   Baseline Independent (met 08-17-16)   Time 4   Period Weeks   Status Achieved           PT Long Term Goals - 06/28/16 1629      PT LONG TERM GOAL #1   Title Improve TUG score to </= 14.5 secs with LBQC to reduce fall risk.  (09-27-16)   Baseline 23.81 secs with LBQC   Time 12   Period Weeks   Status New     PT LONG TERM GOAL #2   Title Improve gait velocity to >/= 2.5 ft/sec with single point cane for incr. gait efficiency.  (09-27-16)   Baseline 1.59 ft/sec with [facility]   Time 12   Period Weeks   Status New     PT LONG TERM GOAL #3   Title Modified independent with household ambulation without device with AFO.  (09-27-16)   Baseline Pt requires use of LBQC for assist. with ambulation - with supervision for safety   Time 12   Period Weeks   Status  New     PT LONG TERM GOAL #4   Title Amb. 1000' with single point cane with S on flat even and uneven surfaces for incr. community accessibility.  (09-27-16)   Baseline 200' with SBA on uneven surface with LBQC   Time 12   Period Weeks   Status New     PT LONG TERM GOAL #5   Title Verbalize understanding of options for community exercise programs upon D/C from PT.  (09-27-16)   Baseline Dependent   Time 12    Period Weeks   Status New               Plan - 08/26/16 1321    Clinical Impression Statement Pt continues to improve in balance and strength.  Needed 2 seated rest breaks during session today.  Continue PT per POC.   Rehab Potential Good   PT Frequency 2x / week   PT Duration 12 weeks   PT Treatment/Interventions ADLs/Self Care Home Management;DME Instruction;Gait training;Stair training;Functional mobility training;Orthotic Fit/Training;Patient/family education;Neuromuscular re-education;Balance training;Therapeutic exercise;Therapeutic activities   PT Next Visit Plan cont LLE strengthening   PT Home Exercise Plan LLE strengthening   Consulted and Agree with Plan of Care Patient      Patient will benefit from skilled therapeutic intervention in order to improve the following deficits and impairments:  Abnormal gait, Decreased balance, Decreased coordination, Decreased endurance, Pain, Decreased mobility, Decreased strength, Decreased skin integrity, Impaired sensation, Impaired tone  Visit Diagnosis: Other abnormalities of gait and mobility  Muscle weakness (generalized)  Unsteadiness on feet  Other lack of coordination     Problem List Patient Active Problem List   Diagnosis Date Noted  . Seizures ([place]) 06/18/2016  . Hx of suicide attempt   . Traumatic brain injury with loss of consciousness of 1 hour to 5 hours 59 minutes ([place]) 06/03/2016  . Left hemiparesis ([place])   . S/P thoracotomy 05/14/2016  . Cerebral embolism with cerebral infarction 05/11/2016  . Major depressive disorder, recurrent severe without psychotic features ([place]) 04/12/2016  . Multiple pelvic fractures ([place]) 03/03/2016  . Left wrist fracture 03/03/2016  . Multiple fractures of ribs of right side 03/03/2016  . Fracture of multiple ribs of left side 03/03/2016  . Liver laceration 03/03/2016  . Small intestine injury 03/03/2016  . Acute blood loss anemia 03/03/2016  . Suicide attempt  03/03/2016  . Bilateral pulmonary contusion 03/03/2016  . Acute respiratory failure ([place]) 03/03/2016  . Hypokalemia 03/03/2016  . Hypernatremia 03/03/2016  . Acute kidney injury ([place]) 03/03/2016  . Ingestion of caustic substance 03/03/2016  . Tylenol overdose 03/03/2016  . Hyperglycemia 03/03/2016  . Hepatic failure ([place]) 03/03/2016  . Pressure ulcer 02/26/2016  . Fall from, out of or through building, not otherwise specified, initial encounter 02/09/2016  . Hypovolemic shock ([place]) 02/09/2016    Narda Bonds 08/26/2016, 1:24 PM  [place] [address] [place], [place], [address] Phone: [phone]   Fax:  [phone]  Name: [name] Bell MRN: [id number] Date of Birth: 02-22-1981  Narda Bonds, PTA [place] 08/26/16 1:25 PM Phone: [phone] Fax: [phone]

## 2016-08-30 ENCOUNTER — Ambulatory Visit: Payer: Medicaid Other | Admitting: Physical Therapy

## 2016-08-30 DIAGNOSIS — R2689 Other abnormalities of gait and mobility: Secondary | ICD-10-CM | POA: Diagnosis not present

## 2016-08-30 DIAGNOSIS — M6281 Muscle weakness (generalized): Secondary | ICD-10-CM

## 2016-08-30 NOTE — Therapy (Signed)
[facility] [address] [place], [place], [address] Phone: [phone]   Fax:  [phone]  Physical Therapy Treatment  Patient Details  Name: [name] Bell MRN: [id number] Date of Birth: 02-16-1981 Referring Provider: Dr. Claudette Laws  Encounter Date: 08/30/2016      PT End of Session - 08/30/16 2021    Visit Number 9   Number of Visits 24   Date for PT Re-Evaluation 08/27/16   Authorization Type Medicaid   Authorization Time Period 9-8 - 10-07-16   Authorization - Visit Number 8   Authorization - Number of Visits 24   PT Start Time 1313   PT Stop Time 1400   PT Time Calculation (min) 47 min      Past Medical History:  Diagnosis Date  . ADD (attention deficit disorder)    has been on medication  . ADHD (attention deficit hyperactivity disorder)   . Anemia   . Anxiety   . Depression   . H/O suicide attempt    at age 28.   Marland Kitchen PTSD (post-traumatic stress disorder)   . Seizures (HCC)   . Stroke [facility])     Past Surgical History:  Procedure Laterality Date  . APPLICATION OF WOUND VAC  02/09/2016   Procedure: APPLICATION OF WOUND VAC;  Surgeon: Jimmye Norman, MD;  Location: [facility] OR;  Service: General;;  . APPLICATION OF WOUND VAC N/A 02/16/2016   Procedure: RE-APPLICATION OF WOUND VAC;  Surgeon: [name], MD;  Location: [facility] OR;  Service: General;  Laterality: N/A;  . BOWEL RESECTION  02/09/2016   Procedure: SMALL BOWEL RESECTION, MESENTERIC REPAIR;  Surgeon: Jimmye Norman, MD;  Location: [facility] OR;  Service: General;;  . CHEST TUBE INSERTION Right 02/11/2016   Procedure: CHEST TUBE INSERTION;  Surgeon: Jimmye Norman, MD;  Location: [facility] OR;  Service: General;  Laterality: Right;  . CHEST TUBE INSERTION Right 02/26/2016   Procedure: CHEST TUBE INSERTION;  Surgeon: Kerin Perna, MD;  Location: [facility] OR;  Service: Thoracic;  Laterality: Right;  . CHOLECYSTECTOMY  02/09/2016   Procedure: CHOLECYSTECTOMY;  Surgeon: Jimmye Norman, MD;  Location: [facility] OR;  Service: General;;  . ESOPHAGOGASTRODUODENOSCOPY N/A 02/10/2016   Procedure: ESOPHAGOGASTRODUODENOSCOPY (EGD);  Surgeon: Sherrilyn Rist, MD;  Location[facility] ENDOSCOPY;  Service: Endoscopy;  Laterality: N/A;  . EXTERNAL FIXATION PELVIS  02/09/2016   Procedure: EXTERNAL FIXATION PELVIS;  Surgeon: Myrene Galas, MD;  Location: [facility] OR;  Service: Orthopedics;;  . HEMATOMA EVACUATION Right 05/14/2016   Procedure: EVACUATION HEMATOMA;  Surgeon: Alleen Borne, MD;  Location: [facility] OR;  Service: Thoracic;  Laterality: Right;  Evacuation of Hematoma Right Chest  . LACERATION REPAIR  02/09/2016   Procedure: REPAIR LIVER LACERATION;  Surgeon: Jimmye Norman, MD;  Location: [facility] OR;  Service: General;;  . LAPAROTOMY N/A 02/10/2016   Procedure: EXPLORATORY LAPAROTOMY, removal of packs,  cauterization of liver, repacking of liver, and open abdomen vac application;  Surgeon: [name], MD;  Location: [facility] OR;  Service: General;  Laterality: N/A;  . LAPAROTOMY N/A 02/11/2016   Procedure: EXPLORATORY LAPAROTOMY VAC CHANGE ;  Surgeon: Jimmye Norman, MD;  Location: [facility] OR;  Service: General;  Laterality: N/A;  . LAPAROTOMY N/A 02/13/2016   Procedure: EXPLORATORY LAPAROTOMY, REMOVAL OF PACKS, ABDOMINAL VAC DRESSING CHANGE;  Surgeon: [name], MD;  Location: [facility] OR;  Service: General;  Laterality: N/A;  . LAPAROTOMY N/A 02/18/2016   Procedure: EXPLORATORY LAPAROTOMY, PLACEMENT OF ABRA ABDOMINAL WALL CLOSURE SET;  Surgeon:  Jimmye NormanJames [name], MD;  [facility];  Service: General;  Laterality: N/A;  . LAPAROTOMY N/A 02/09/2016   Procedure: EXPLORATORY LAPAROTOMY;  Surgeon: Jimmye NormanJames [name], MD;  [facility];  Service: General;  Laterality: N/A;  . LAPAROTOMY N/A 02/16/2016   Procedure: EXPLORATORY LAPAROTOMY, ABDOMINAL WASH OUT;  Surgeon: [name] Thompson, MD;  [facility];  Service: General;  Laterality: N/A;  . PERCUTANEOUS TRACHEOSTOMY N/A 03/01/2016   Procedure: PERCUTANEOUS TRACHEOSTOMY;  Surgeon: Jimmye NormanJames  [name], MD;  [facility];  Service: General;  Laterality: N/A;  . SACRO-ILIAC PINNING Right 02/16/2016   Procedure: Loyal GamblerSACRO-ILIAC PINNING;  Surgeon: Myrene GalasMichael [name], MD;  [facility];  Service: Orthopedics;  Laterality: Right;  . TEE WITHOUT CARDIOVERSION N/A 05/14/2016   Procedure: TRANSESOPHAGEAL ECHOCARDIOGRAM (TEE);  Surgeon: Alleen BorneBryan K Bartle, MD;  [facility];  Service: Thoracic;  Laterality: N/A;  . THORACOTOMY/LOBECTOMY Right 04/26/2016   Procedure: Right THORACOTOMY AND DRAINAGE OF EMPYEMA;  Surgeon: Alleen BorneBryan K Bartle, MD;  Location: [facility] OR;  Service: Thoracic;  Laterality: Right;  Marland Kitchen. VIDEO ASSISTED THORACOSCOPY (VATS)/THOROCOTOMY Right 05/14/2016   Procedure: RIGHT VIDEO ASSISTED THORACOSCOPY (VATS),DRAINAGE OF EMPYEMA;  Surgeon: Alleen BorneBryan K Bartle, MD;  [facility]: [facility] OR;  Service: Thoracic;  Laterality: Right;  . WOUND DEBRIDEMENT N/A 03/01/2016   Procedure: ABDOMINAL WOUND CLOSURE;  Surgeon: Jimmye NormanJames [name], MD;  [facility];  Service: General;  Laterality: N/A;    There were no vitals filed for this visit.      Subjective Assessment - 08/30/16 1310    Subjective Pt reports going up steps 2 times at home without getting winded - getting stronger; going to join [employer]   Pertinent History Suicide attempt on 02-09-16 (fall from 5th floor hotel room) with multiple injuries sustained including TBI with LOC, multiple pelvic fractures, L wrist fracture, multiple fractures of ribs R and L sides; liver laceration:  bil. pulmonary contusion:  acute respiratory failure:  cerebral embolism with cerebral infarction and seizure sustained on 05-10-16                                                                   How long can you sit comfortably? 30" - then start moving to try to get more comfortable   How long can you stand comfortably? 15-20"   How long can you walk comfortably? 200'   Patient Stated Goals "to get my toes to wiggle and to be able to walk without cane and get up out of chairs easier"    Currently in Pain? No/denies                         [facility] - 08/30/16 1320      Lumbar Exercises: Aerobic   Elliptical 2" forward   stopped after 2" due to fatigue     Lumbar Exercises: Machines for Strengthening   Leg Press 50# bil. LE 10 reps;  60# 10 reps:  LLE only 35# 20 reps     Lumbar Exercises: Standing   Heel Raises 10 reps   Functional Squats 10 reps  on BOSU inside // bars     Lumbar Exercises: Sidelying   Clam 20 reps   Hip Abduction 10 reps  no  weight: 10 reps 2#   Other Sidelying Lumbar Exercises Pilates     Knee/Hip Exercises: Machines for Strengthening   Cybex Leg Press 50# bil. LE's:  60# bil. LE's  LLE 35# 10 reps     Knee/Hip Exercises: Prone   Hamstring Curl 20 reps  5#     Clam shell LLE above performed with 5# weight - 20 reps  L hip extension with knee flexed - no weight used - 10 reps   NeuroRe-ed;  Quadriped - lifting opposite UE and LE - 5 sec hold attempted - 3 reps each side  LLE SLS on BOSU - moving RLE up /back and laterally 10 reps each with UE support prn  Stepping over and back of balance beam with RLE for LLE SLS - 10 reps with min. UE support  L heel raise in closed chain position - off hi/lo table 10 reps; R foot on [employer] step for incr. LLE weight-bearing            PT Short Term Goals - 08/30/16 2025      PT SHORT TERM GOAL #1   Title Improve TUG score to </= 18 secs with LBQC to demonstrate improved functional mobility.  (07-29-16)   Baseline 13.88 with no device - on 08-17-16   Time 4   Period Weeks   Status Achieved     PT SHORT TERM GOAL #2   Title Improve gait velocity to >/= 2.0 ft/sec with [facility] for incr. gait efficiency.  (07-29-16)   Baseline 15.10 secs with tripod based  = 2.15 ft/sec with cane on 08-18-16   Time 4   Period Weeks   Status Achieved     PT SHORT TERM GOAL #3   Title Amb. 100' with min assist with single point cane on flat, even surface.   (07-29-16)   Baseline Pt able to amb. with cane 120' with SBA  (08-17-16)   Time 4   Period Weeks   Status Achieved     PT SHORT TERM GOAL #4   Title Increase Berg balance test score by at least 5 points to reduce fall risk.  (07-29-16)   Baseline Berg test to be completed next session - Not tested due to time constraint at evaluation   Status On-going     PT SHORT TERM GOAL #5   Title Independent in HEP for LLE strengthening and stretching.  (07-29-16)   Status Achieved           PT Long Term Goals - 06/28/16 1629      PT LONG TERM GOAL #1   Title Improve TUG score to </= 14.5 secs with LBQC to reduce fall risk.  (09-27-16)   Baseline 23.81 secs with LBQC   Time 12   Period Weeks   Status New     PT LONG TERM GOAL #2   Title Improve gait velocity to >/= 2.5 ft/sec with single point cane for incr. gait efficiency.  (09-27-16)   Baseline 1.59 ft/sec with [facility]   Time 12   Period Weeks   Status New     PT LONG TERM GOAL #3   Title Modified independent with household ambulation without device with AFO.  (09-27-16)   Baseline Pt requires use of LBQC for assist. with ambulation - with supervision for safety   Time 12   Period Weeks   Status New     PT LONG TERM GOAL #4   Title Amb. 1000' with single point  cane with S on flat even and uneven surfaces for incr. community accessibility.  (09-27-16)   Baseline 200' with SBA on uneven surface with LBQC   Time 12   Period Weeks   Status New     PT LONG TERM GOAL #5   Title Verbalize understanding of options for community exercise programs upon D/C from PT.  (09-27-16)   Baseline Dependent   Time 12   Period Weeks   Status New               Plan - 08/30/16 2022    Clinical Impression Statement Pt has weakness in L hip abductors - resulting in LLE adduction in standing on compliant surface and in seated position on Scifit: pt able to correct and hold LLE in midline with cues   Rehab Potential Good   PT Frequency 2x  / week   PT Duration 12 weeks   PT Treatment/Interventions ADLs/Self Care Home Management;DME Instruction;Gait training;Stair training;Functional mobility training;Orthotic Fit/Training;Patient/family education;Neuromuscular re-education;Balance training;Therapeutic exercise;Therapeutic activities   PT Next Visit Plan cont LLE strengthening; complete Berg test   PT Home Exercise Plan LLE strengthening   Consulted and Agree with Plan of Care Patient      Patient will benefit from skilled therapeutic intervention in order to improve the following deficits and impairments:  Abnormal gait, Decreased balance, Decreased coordination, Decreased endurance, Pain, Decreased mobility, Decreased strength, Decreased skin integrity, Impaired sensation, Impaired tone  Visit Diagnosis: Other abnormalities of gait and mobility  Muscle weakness (generalized)     Problem List Patient Active Problem List   Diagnosis Date Noted  . Seizures (HCC) 06/18/2016  . Hx of suicide attempt   . 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 Left hemiparesis (HCC)   . S/P thoracotomy 05/14/2016  . Cerebral embolism with cerebral infarction 05/11/2016  . 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 Multiple pelvic fractures (HCC) 03/03/2016  . Left wrist fracture 03/03/2016  . Multiple fractures of ribs of right side 03/03/2016  . Fracture of multiple ribs of left side 03/03/2016  . Liver laceration 03/03/2016  . Small intestine injury 03/03/2016  . Acute blood loss anemia 03/03/2016  . Suicide attempt 03/03/2016  . Bilateral pulmonary contusion 03/03/2016  . Acute respiratory failure (HCC) 03/03/2016  . Hypokalemia 03/03/2016  . Hypernatremia 03/03/2016  . Acute kidney injury (HCC) 03/03/2016  . Ingestion of caustic substance 03/03/2016  . Tylenol overdose 03/03/2016  . Hyperglycemia 03/03/2016  .  Hepatic failure (HCC) 03/03/2016  . Pressure ulcer 02/26/2016  . Fall from, out of or through building, not otherwise specified, initial encounter 02/09/2016  . Hypovolemic shock (HCC) 02/09/2016    Ascencion Coye, Donavan Burnet, PT 08/30/2016, 8:29 PM  [facility] [address] [place], [place], [address] Phone: [phone]   Fax:  [phone]  Name: [name] Bell MRN: [id number] Date of Birth: 1980-12-22

## 2016-09-01 ENCOUNTER — Ambulatory Visit: Payer: Medicaid Other | Admitting: Physical Therapy

## 2016-09-02 ENCOUNTER — Ambulatory Visit: Payer: Medicaid Other | Admitting: Physical Therapy

## 2016-09-03 ENCOUNTER — Ambulatory Visit: Payer: Medicaid Other | Admitting: Rehabilitation

## 2016-09-07 ENCOUNTER — Ambulatory Visit: Payer: Self-pay | Admitting: Physical Therapy

## 2016-09-08 ENCOUNTER — Ambulatory Visit: Payer: Self-pay | Admitting: Family Medicine

## 2016-09-09 ENCOUNTER — Ambulatory Visit: Payer: Medicaid Other | Attending: Physical Medicine & Rehabilitation | Admitting: Physical Therapy

## 2016-09-09 DIAGNOSIS — R278 Other lack of coordination: Secondary | ICD-10-CM | POA: Diagnosis present

## 2016-09-09 DIAGNOSIS — M6281 Muscle weakness (generalized): Secondary | ICD-10-CM | POA: Insufficient documentation

## 2016-09-09 DIAGNOSIS — R2681 Unsteadiness on feet: Secondary | ICD-10-CM | POA: Diagnosis present

## 2016-09-09 DIAGNOSIS — R2689 Other abnormalities of gait and mobility: Secondary | ICD-10-CM | POA: Insufficient documentation

## 2016-09-09 NOTE — Therapy (Signed)
[facility] [address] [place], [place], [address] Phone: [phone]   Fax:  [phone]  Physical Therapy Treatment  Patient Details  Name: [name] Bell MRN: [id number] Date of Birth: [birth year] Referring Provider: Dr. Claudette Laws  Encounter Date: 09/09/2016      PT End of Session - 09/09/16 1256    Visit Number 10   Number of Visits 24   Date for PT Re-Evaluation 08/27/16   Authorization Type Medicaid   Authorization Time Period 33-8 - 10-07-16   Authorization - Visit Number 9   Authorization - Number of Visits 24   PT Start Time 0805   PT Stop Time 0846   PT Time Calculation (min) 41 min   Activity Tolerance Patient tolerated treatment well   Behavior During Therapy [facility] for tasks assessed/performed      Past Medical History:  Diagnosis Date  . ADD (attention deficit disorder)    has been on medication  . ADHD (attention deficit hyperactivity disorder)   . Anemia   . Anxiety   . Depression   . H/O suicide attempt    at age 35.   Marland Kitchen PTSD (post-traumatic stress disorder)   . Seizures (HCC)   . Stroke [facility])     Past Surgical History:  Procedure Laterality Date  . APPLICATION OF WOUND VAC  02/09/2016   Procedure: APPLICATION OF WOUND VAC;  Surgeon: Jimmye Norman, MD;  Location: [facility] OR;  Service: General;;  . APPLICATION OF WOUND VAC N/A 02/16/2016   Procedure: RE-APPLICATION OF WOUND VAC;  Surgeon: [name], MD;  Location: [facility] OR;  Service: General;  Laterality: N/A;  . BOWEL RESECTION  02/09/2016   Procedure: SMALL BOWEL RESECTION, MESENTERIC REPAIR;  Surgeon: Jimmye Norman, MD;  Location: [facility] OR;  Service: General;;  . CHEST TUBE INSERTION Right 02/11/2016   Procedure: CHEST TUBE INSERTION;  Surgeon: Jimmye Norman, MD;  Location: [facility] OR;  Service: General;  Laterality: Right;  . CHEST TUBE INSERTION Right 02/26/2016   Procedure: CHEST TUBE INSERTION;  Surgeon: Kerin Perna, MD;  Location: [facility] OR;   Service: Thoracic;  Laterality: Right;  . CHOLECYSTECTOMY  02/09/2016   Procedure: CHOLECYSTECTOMY;  Surgeon: Jimmye Norman, MD;  Location: [facility] OR;  Service: General;;  . ESOPHAGOGASTRODUODENOSCOPY N/A 02/10/2016   Procedure: ESOPHAGOGASTRODUODENOSCOPY (EGD);  Surgeon: Sherrilyn Rist, MD;  Location: [facility] ENDOSCOPY;  Service: Endoscopy;  Laterality: N/A;  . EXTERNAL FIXATION PELVIS  02/09/2016   Procedure: EXTERNAL FIXATION PELVIS;  Surgeon: Myrene Galas, MD;  Location: [facility] OR;  Service: Orthopedics;;  . HEMATOMA EVACUATION Right 05/14/2016   Procedure: EVACUATION HEMATOMA;  Surgeon: Alleen Borne, MD;  Location: [facility] OR;  Service: Thoracic;  Laterality: Right;  Evacuation of Hematoma Right Chest  . LACERATION REPAIR  02/09/2016   Procedure: REPAIR LIVER LACERATION;  Surgeon: Jimmye Norman, MD;  Location: [facility] OR;  Service: General;;  . LAPAROTOMY N/A 02/10/2016   Procedure: EXPLORATORY LAPAROTOMY, removal of packs,  cauterization of liver, repacking of liver, and open abdomen vac application;  Surgeon: [name], MD;  Location: [facility] OR;  Service: General;  Laterality: N/A;  . LAPAROTOMY N/A 02/11/2016   Procedure: EXPLORATORY LAPAROTOMY VAC CHANGE ;  Surgeon: Jimmye Norman, MD;  Location: [facility] OR;  Service: General;  Laterality: N/A;  . LAPAROTOMY N/A 02/13/2016   Procedure: EXPLORATORY LAPAROTOMY, REMOVAL OF PACKS, ABDOMINAL VAC DRESSING CHANGE;  Surgeon: [name], MD;  Location: [facility] OR;  Service: General;  Laterality: N/A;  .  LAPAROTOMY N/A 02/18/2016   Procedure: EXPLORATORY LAPAROTOMY, PLACEMENT OF ABRA ABDOMINAL WALL CLOSURE SET;  Surgeon: Jimmye Norman, MD;  Location: [facility] OR;  Service: General;  Laterality: N/A;  . LAPAROTOMY N/A 02/09/2016   Procedure: EXPLORATORY LAPAROTOMY;  Surgeon: Jimmye Norman, MD;  Location: [facility] OR;  Service: General;  Laterality: N/A;  . LAPAROTOMY N/A 02/16/2016   Procedure: EXPLORATORY LAPAROTOMY, ABDOMINAL WASH OUT;  Surgeon: [name], MD;  Location: [facility] OR;  Service: General;   Laterality: N/A;  . PERCUTANEOUS TRACHEOSTOMY N/A 03/01/2016   Procedure: PERCUTANEOUS TRACHEOSTOMY;  Surgeon: Jimmye Norman, MD;  Location: [facility] OR;  Service: General;  Laterality: N/A;  Theodoro Parma PINNING Right 02/16/2016   Procedure: Loyal Gambler;  Surgeon: Myrene Galas, MD;  Location: [facility] OR;  Service: Orthopedics;  Laterality: Right;  . TEE WITHOUT CARDIOVERSION N/A 05/14/2016   Procedure: TRANSESOPHAGEAL ECHOCARDIOGRAM (TEE);  Surgeon: Alleen Borne, MD;  Location: [facility] OR;  Service: Thoracic;  Laterality: N/A;  . THORACOTOMY/LOBECTOMY Right 04/26/2016   Procedure: Right THORACOTOMY AND DRAINAGE OF EMPYEMA;  Surgeon: Alleen Borne, MD;  Location: [facility] OR;  Service: Thoracic;  Laterality: Right;  Marland Kitchen VIDEO ASSISTED THORACOSCOPY (VATS)/THOROCOTOMY Right 05/14/2016   Procedure: RIGHT VIDEO ASSISTED THORACOSCOPY (VATS),DRAINAGE OF EMPYEMA;  Surgeon: Alleen Borne, MD;  Location: [facility] OR;  Service: Thoracic;  Laterality: Right;  . WOUND DEBRIDEMENT N/A 03/01/2016   Procedure: ABDOMINAL WOUND CLOSURE;  Surgeon: Jimmye Norman, MD;  Location: [facility] OR;  Service: General;  Laterality: N/A;    There were no vitals filed for this visit.      Subjective Assessment - 09/09/16 0814    Subjective Pt states all her family has had a cold and going around the house.  Is feeling better today.  Wants to learn how to get up off the floor in case she falls.   Pertinent History Suicide attempt on 02-09-16 (fall from 5th floor hotel room) with multiple injuries sustained including TBI with LOC, multiple pelvic fractures, L wrist fracture, multiple fractures of ribs R and L sides; liver laceration:  bil. pulmonary contusion:  acute respiratory failure:  cerebral embolism with cerebral infarction and seizure sustained on 05-10-16                                                                   How long can you sit comfortably? 30" - then start moving to try to get more comfortable   How long can you stand comfortably? 15-20"    How long can you walk comfortably? 200'   Patient Stated Goals "to get my toes to wiggle and to be able to walk without cane and get up out of chairs easier"   Currently in Pain? No/denies            [facility] PT Assessment - 09/09/16 0001      Standardized Balance Assessment   Standardized Balance Assessment Berg Balance Test     Berg Balance Test   Sit to Stand Able to stand without using hands and stabilize independently   Standing Unsupported Able to stand safely 2 minutes   Sitting with Back Unsupported but Feet Supported on Floor or Stool Able to sit safely and securely 2 minutes   Stand to Sit Sits safely with minimal  use of hands   Transfers Able to transfer safely, minor use of hands   Standing Unsupported with Eyes Closed Able to stand 10 seconds safely   Standing Ubsupported with Feet Together Able to place feet together independently and stand 1 minute safely   From Standing, Reach Forward with Outstretched Arm Can reach confidently >25 cm (10")   From Standing Position, Pick up Object from Floor Able to pick up shoe safely and easily   From Standing Position, Turn to Look Behind Over each Shoulder Looks behind from both sides and weight shifts well   Turn 360 Degrees Able to turn 360 degrees safely in 4 seconds or less   Standing Unsupported, Alternately Place Feet on Step/Stool Able to stand independently and safely and complete 8 steps in 20 seconds   Standing Unsupported, One Foot in Front Able to plae foot ahead of the other independently and hold 30 seconds   Standing on One Leg Able to lift leg independently and hold equal to or more than 3 seconds   Total Score 53             [facility] Adult PT Treatment/Exercise - 09/09/16 0834      Bed Mobility   Bed Mobility Rolling Right;Rolling Left   Rolling Right 5: Supervision   Rolling Left 5: Supervision     Transfers   Transfers Sit to Stand;Stand to Sit;Floor to Transfer   Sit to Stand 6: Modified independent  (Device/Increase time)   Stand to Sit 6: Modified independent (Device/Increase time)   Floor to Transfer 4: Min assist   Comments Pt needed UE assist of mat and min assist to come from tall kneeling to half kneeling to stand.  Encouraged pt to get forward weight shift over her leg to come up to standing.  Pt also had difficulty coming from prone to quadruped and reports due to UE weakness along with difficulty pulling LE's in                                                     --     Knee/Hip Exercises: Aerobic   Recumbent Bike Scifit level 5.0 all 4 extremities x 10 minutes for strengthening and flexibility     Knee/Hip Exercises: Standing   Other Standing Knee Exercises tall kneeling to half kneeling x 5-provided as HEP                PT Education - 09/09/16 0848    Education provided Yes   Education Details BERG score, tall kneeling exercise   Person(s) Educated Patient   Methods Explanation;Demonstration;Handout   Comprehension Verbalized understanding;Returned demonstration          PT Short Term Goals - 08/30/16 2025      PT SHORT TERM GOAL #1   Title Improve TUG score to </= 18 secs with LBQC to demonstrate improved functional mobility.  (07-29-16)   Baseline 13.88 with no device - on 08-17-16   Time 4   Period Weeks   Status Achieved     PT SHORT TERM GOAL #2   Title Improve gait velocity to >/= 2.0 ft/sec with [facility] for incr. gait efficiency.  (07-29-16)   Baseline 15.10 secs with tripod based  = 2.15 ft/sec with cane on 08-18-16   Time 4   Period Weeks  Status Achieved     PT SHORT TERM GOAL #3   Title Amb. 100' with min assist with single point cane on flat, even surface.  (07-29-16)   Baseline Pt able to amb. with cane 120' with SBA  (08-17-16)   Time 4   Period Weeks   Status Achieved     PT SHORT TERM GOAL #4   Title Increase Berg balance test score by at least 5 points to reduce fall risk.  (07-29-16)   Baseline Berg test to be  completed next session - Not tested due to time constraint at evaluation   Status On-going     PT SHORT TERM GOAL #5   Title Independent in HEP for LLE strengthening and stretching.  (07-29-16)   Status Achieved           PT Long Term Goals - 06/28/16 1629      PT LONG TERM GOAL #1   Title Improve TUG score to </= 14.5 secs with LBQC to reduce fall risk.  (09-27-16)   Baseline 23.81 secs with LBQC   Time 12   Period Weeks   Status New     PT LONG TERM GOAL #2   Title Improve gait velocity to >/= 2.5 ft/sec with single point cane for incr. gait efficiency.  (09-27-16)   Baseline 1.59 ft/sec with [facility]   Time 12   Period Weeks   Status New     PT LONG TERM GOAL #3   Title Modified independent with household ambulation without device with AFO.  (09-27-16)   Baseline Pt requires use of LBQC for assist. with ambulation - with supervision for safety   Time 12   Period Weeks   Status New     PT LONG TERM GOAL #4   Title Amb. 1000' with single point cane with S on flat even and uneven surfaces for incr. community accessibility.  (09-27-16)   Baseline 200' with SBA on uneven surface with LBQC   Time 12   Period Weeks   Status New     PT LONG TERM GOAL #5   Title Verbalize understanding of options for community exercise programs upon D/C from PT.  (09-27-16)   Baseline Dependent   Time 12   Period Weeks   Status New           Plan - 09/09/16 1256    Clinical Impression Statement Pt with signiificant difficulty getting up from floor from prone.  Reports she struggles with this at home as well when doing exercises.  Continue with decreased strength and balance.  Continue PT per POC.   Rehab Potential Good   PT Frequency 2x / week   PT Duration 12 weeks   PT Treatment/Interventions ADLs/Self Care Home Management;DME Instruction;Gait training;Stair training;Functional mobility training;Orthotic Fit/Training;Patient/family education;Neuromuscular re-education;Balance  training;Therapeutic exercise;Therapeutic activities   PT Next Visit Plan Work on tall kneeling<>half kneeling;try quadruped;try modified plank.  Needs work on core and UE as well.   PT Home Exercise Plan LLE strengthening   Consulted and Agree with Plan of Care Patient      Patient will benefit from skilled therapeutic intervention in order to improve the following deficits and impairments:  Abnormal gait, Decreased balance, Decreased coordination, Decreased endurance, Pain, Decreased mobility, Decreased strength, Decreased skin integrity, Impaired sensation, Impaired tone  Visit Diagnosis: Other abnormalities of gait and mobility  Muscle weakness (generalized)  Unsteadiness on feet  Other lack of coordination     Problem List Patient Active  Problem List   Diagnosis Date Noted  . Seizures (HCC) 06/18/2016  . Hx of suicide attempt   . 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 Left hemiparesis (HCC)   . S/P thoracotomy 05/14/2016  . Cerebral embolism with cerebral infarction 05/11/2016  . 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 Multiple pelvic fractures (HCC) 03/03/2016  . Left wrist fracture 03/03/2016  . Multiple fractures of ribs of right side 03/03/2016  . Fracture of multiple ribs of left side 03/03/2016  . Liver laceration 03/03/2016  . Small intestine injury 03/03/2016  . Acute blood loss anemia 03/03/2016  . Suicide attempt 03/03/2016  . Bilateral pulmonary contusion 03/03/2016  . Acute respiratory failure (HCC) 03/03/2016  . Hypokalemia 03/03/2016  . Hypernatremia 03/03/2016  . Acute kidney injury (HCC) 03/03/2016  . Ingestion of caustic substance 03/03/2016  . Tylenol overdose 03/03/2016  . Hyperglycemia 03/03/2016  . Hepatic failure (HCC) 03/03/2016  . Pressure ulcer 02/26/2016  . Fall from, out of or through building, not otherwise specified,  initial encounter 02/09/2016  . Hypovolemic shock (HCC) 02/09/2016    Newell Coralobertson, Denise Terry 09/09/2016, 1:00 PM  [facility] [address] [place], [place], [address] Phone: [phone]   Fax:  [phone]  Name: Phylliss BobKatia D Menon MRN: [id number] Date of Birth: 1981/06/08  Newell Coralenise Terry Robertson, [place]A [facility] 09/09/16 1:02 PM Phone: [phone] Fax: [phone]

## 2016-09-09 NOTE — Patient Instructions (Signed)
DEVELOPMENTAL POSITION: Tall Kneeling to Half Kneeling    From tall kneeling position, shift weight to one side. Bring opposite leg forward, place foot flat on surface. 10 reps per set, 2 sets per day.   Repeat with other leg.  Copyright  VHI. All rights reserved.

## 2016-09-13 ENCOUNTER — Ambulatory Visit: Payer: Medicaid Other | Admitting: Physical Therapy

## 2016-09-14 ENCOUNTER — Encounter: Payer: Medicaid Other | Attending: Physical Medicine & Rehabilitation | Admitting: Physical Medicine & Rehabilitation

## 2016-09-14 ENCOUNTER — Encounter: Payer: Self-pay | Admitting: Physical Medicine & Rehabilitation

## 2016-09-14 VITALS — BP 102/69 | HR 79 | Resp 14

## 2016-09-14 DIAGNOSIS — R569 Unspecified convulsions: Secondary | ICD-10-CM | POA: Diagnosis not present

## 2016-09-14 DIAGNOSIS — Z87820 Personal history of traumatic brain injury: Secondary | ICD-10-CM | POA: Diagnosis present

## 2016-09-14 DIAGNOSIS — Z79899 Other long term (current) drug therapy: Secondary | ICD-10-CM | POA: Diagnosis not present

## 2016-09-14 DIAGNOSIS — I63411 Cerebral infarction due to embolism of right middle cerebral artery: Secondary | ICD-10-CM

## 2016-09-14 DIAGNOSIS — S069X3S Unspecified intracranial injury with loss of consciousness of 1 hour to 5 hours 59 minutes, sequela: Secondary | ICD-10-CM

## 2016-09-14 DIAGNOSIS — G8194 Hemiplegia, unspecified affecting left nondominant side: Secondary | ICD-10-CM

## 2016-09-14 DIAGNOSIS — I69354 Hemiplegia and hemiparesis following cerebral infarction affecting left non-dominant side: Secondary | ICD-10-CM | POA: Insufficient documentation

## 2016-09-14 NOTE — Progress Notes (Signed)
Subjective:    Patient ID: [name] Bell, female    DOB: Nov 18, 1980, 35 y.o.   MRN: [id number]  HPI   [name] Bell is here in follow up of her right MCA infarct/traumatic brain injury. She is progressing with balance and mobility. She still has some tingling in her left foot and weakness in her big toe. Tingling worst at night.   She is walking without a brace now. She finds that her gait improved off the cane.   Neurology stopped her vimpat after a negative EEG. She's had no seizures.   Pain Inventory Average Pain 5 Pain Right Now 0 My pain is tingling  In the last 24 hours, has pain interfered with the following? General activity 1 Relation with others 0 Enjoyment of life 0 What TIME of day is your pain at its worst? night Sleep (in general) Fair  Pain is worse with: walking and standing Pain improves with: rest Relief from Meds: n/a  Mobility walk without assistance how many minutes can you walk? 60 ability to climb steps?  yes do you drive?  no  Function disabled: date disabled .  Neuro/Psych No problems in this area  Prior Studies Any changes since last visit?  no  Physicians involved in your care Any changes since last visit?  no   Family History  Problem Relation Age of Onset  . Hypertension Father   . Mental illness Mother   . Stroke Maternal Aunt   . Hypertension Sister   . Fibroids Sister    Social History   Social History  . Marital status: Single    Spouse name: N/A  . Number of children: N/A  . Years of education: N/A   Social History Main Topics  . Smoking status: Never Smoker  . Smokeless tobacco: Never Used  . Alcohol use No     Comment: unknown  . Drug use: No  . Sexual activity: No   Other Topics Concern  . None   Social History Narrative  . None   Past Surgical History:  Procedure Laterality Date  . APPLICATION OF WOUND VAC  02/09/2016   Procedure: APPLICATION OF WOUND VAC;  Surgeon: Jimmye NormanJames [name], MD;  [facility];   Service: General;;  . APPLICATION OF WOUND VAC N/A 02/16/2016   Procedure: RE-APPLICATION OF WOUND VAC;  Surgeon: [name] Thompson, MD;  Location: [facility] OR;  Service: General;  Laterality: N/A;  . BOWEL RESECTION  02/09/2016   Procedure: SMALL BOWEL RESECTION, MESENTERIC REPAIR;  Surgeon: Jimmye NormanJames [name], MD;  [facility];  Service: General;;  . CHEST TUBE INSERTION Right 02/11/2016   Procedure: CHEST TUBE INSERTION;  Surgeon: Jimmye NormanJames [name], MD;  Location: [facility] OR;  Service: General;  Laterality: Right;  . CHEST TUBE INSERTION Right 02/26/2016   Procedure: CHEST TUBE INSERTION;  Surgeon: Kerin PernaPeter Van Trigt, MD;  [facility];  Service: Thoracic;  Laterality: Right;  . CHOLECYSTECTOMY  02/09/2016   Procedure: CHOLECYSTECTOMY;  Surgeon: Jimmye NormanJames [name], MD;  [facility];  Service: General;;  . ESOPHAGOGASTRODUODENOSCOPY N/A 02/10/2016   Procedure: ESOPHAGOGASTRODUODENOSCOPY (EGD);  Surgeon: Sherrilyn RistHenry L Danis III, MD;  [facility];  Service: Endoscopy;  Laterality: N/A;  . EXTERNAL FIXATION PELVIS  02/09/2016   Procedure: EXTERNAL FIXATION PELVIS;  Surgeon: Myrene GalasMichael [name], MD;  [facility];  Service: Orthopedics;;  . HEMATOMA EVACUATION Right 05/14/2016   Procedure: EVACUATION HEMATOMA;  Surgeon: Alleen BorneBryan K Bartle, MD;  [facility]: [facility] OR;  Service: Thoracic;  Laterality: Right;  Evacuation  of Hematoma Right Chest  . LACERATION REPAIR  02/09/2016   Procedure: REPAIR LIVER LACERATION;  Surgeon: Jimmye Norman, MD;  [facility] OR;  Service: General;;  . LAPAROTOMY N/A 02/10/2016   Procedure: EXPLORATORY LAPAROTOMY, removal of packs,  cauterization of liver, repacking of liver, and open abdomen vac application;  Surgeon: [name], MD;  Location: [facility] OR;  Service: General;  Laterality: N/A;  . LAPAROTOMY N/A 02/11/2016   Procedure: EXPLORATORY LAPAROTOMY VAC CHANGE ;  Surgeon: Jimmye Norman, MD;  [facility] OR;  Service: General;  Laterality: N/A;  . LAPAROTOMY N/A 02/13/2016   Procedure: EXPLORATORY LAPAROTOMY, REMOVAL  OF PACKS, ABDOMINAL VAC DRESSING CHANGE;  Surgeon: [name], MD;  [facility] OR;  Service: General;  Laterality: N/A;  . LAPAROTOMY N/A 02/18/2016   Procedure: EXPLORATORY LAPAROTOMY, PLACEMENT OF ABRA ABDOMINAL WALL CLOSURE SET;  Surgeon: Jimmye Norman, MD;  Location: [facility] OR;  Service: General;  Laterality: N/A;  . LAPAROTOMY N/A 02/09/2016   Procedure: EXPLORATORY LAPAROTOMY;  Surgeon: Jimmye Norman, MD;  Location[facility] OR;  Service: General;  Laterality: N/A;  . LAPAROTOMY N/A 02/16/2016   Procedure: EXPLORATORY LAPAROTOMY, ABDOMINAL WASH OUT;  Surgeon: [name], MD;  Location: [facility] OR;  Service: General;  Laterality: N/A;  . PERCUTANEOUS TRACHEOSTOMY N/A 03/01/2016   Procedure: PERCUTANEOUS TRACHEOSTOMY;  Surgeon: Jimmye Norman, MD;  Location: [facility] OR;  Service: General;  Laterality: N/A;  . SACRO-ILIAC PINNING Right 02/16/2016   Procedure: Loyal Gambler;  Surgeon: Myrene Galas, MD;  Location: [facility] OR;  Service: Orthopedics;  Laterality: Right;  . TEE WITHOUT CARDIOVERSION N/A 05/14/2016   Procedure: TRANSESOPHAGEAL ECHOCARDIOGRAM (TEE);  Surgeon: Alleen Borne, MD;  Location: [facility] OR;  Service: Thoracic;  Laterality: N/A;  . THORACOTOMY/LOBECTOMY Right 04/26/2016   Procedure: Right THORACOTOMY AND DRAINAGE OF EMPYEMA;  Surgeon: Alleen Borne, MD;  [facility] OR;  Service: Thoracic;  Laterality: Right;  Marland Kitchen VIDEO ASSISTED THORACOSCOPY (VATS)/THOROCOTOMY Right 05/14/2016   Procedure: RIGHT VIDEO ASSISTED THORACOSCOPY (VATS),DRAINAGE OF EMPYEMA;  Surgeon: Alleen Borne, MD;  [facility]: [facility] OR;  Service: Thoracic;  Laterality: Right;  . WOUND DEBRIDEMENT N/A 03/01/2016   Procedure: ABDOMINAL WOUND CLOSURE;  Surgeon: Jimmye Norman, MD;  Location: [facility] OR;  Service: General;  Laterality: N/A;   Past Medical History:  Diagnosis Date  . ADD (attention deficit disorder)    has been on medication  . ADHD (attention deficit hyperactivity disorder)   . Anemia   . Anxiety   . Depression   . H/O suicide  attempt    at age 67.   Marland Kitchen PTSD (post-traumatic stress disorder)   . Seizures (HCC)   . Stroke (HCC)    BP 102/69   Pulse 79   Resp 14   SpO2 98%   Opioid Risk Score:   Fall Risk Score:  `1  Depression screen PHQ 2/9  Depression screen [facility] 2/9 09/14/2016 07/14/2016 07/09/2016  Decreased Interest 0 0 0  Down, Depressed, Hopeless 0 0 0  PHQ - 2 Score 0 0 0  Altered sleeping - 0 -  Tired, decreased energy - 1 -  Change in appetite - 0 -  Feeling bad or failure about yourself  - 0 -  Trouble concentrating - 2 -  Moving slowly or fidgety/restless - 0 -  Suicidal thoughts - 0 -  PHQ-9 Score - 3 -  Difficult doing work/chores - Not difficult at all -    Review of Systems  All other systems reviewed and are negative.  Objective:   Physical Exam  Constitutional: She appears well-developedand well-nourished.  HENT: Normocephalicand atraumatic.  Eyes: Conjunctivaeand EOM are normal.  Cardiovascular: Regular rate and rhythm. Pulses intact.  Respiratory: Effort normaland breath sounds normal. No stridor. No respiratory distress. She has no wheezes.     GI: Soft. Bowel sounds are normal. She exhibits no distension. There is no tenderness.    Musculoskeletal: No edema, no tenderness. Neurological: She is alertand oriented.  Speech clear. Able to follow commands without difficulty.  Sensation intact in the left lower extremity Deep tendon reflexes 3+ left patellar, 2+ right patella Motor strength: 4+/5 in the right deltoid, biceps, triceps, grip, RLE: 5/5 hip flexor, knee extensor, ankle dorsiflexor. LUE: 4/5 left deltoid, biceps, triceps, grip LLE; 4/5, hip flexion, knee extension, 3+/5 at the ankle dorsiflexor and 3-4/5 plantar flexor. No facial droop. Shuffles the left foot forward through gait.  Skin: Skin is warmand dry. No rashnoted. No erythema.  Psychiatric: Her behavior is normal. Thought contentnormal and at baseline.  Pleasant, cooperative         Assessment & Plan:  1. Left hemiparesis and cognitive deficits secondary to right MCA infarct andtraumatic brain injury, also left sacroiliac fracture status post fusion as well as bilateral superior and inferior pubic ramus fracture with Left HF weakness, weakness may be related to MCA infarct, given she has hyperreflexia in the left lower extremity   -has made nice progress! --continue outpt PT for gait/strength/balance   -discussed bracing options between ASO and foot up brace.   -reviewed the importance of proper mechanics.  2.. Mood: Patient has denied any anxiety, depressive symptoms, insomnia, nightmares or intent for self harm.  -outpt psychology/psychiatric follow up at [facility].  3. TBI/R-MCA infarct with DAI with hemorrhage: ASA added           -cognition nearly at baseline             4.  New onset seizures: now just on keppra.    Follow up in 4 months. Thirty minutes of face to face patient care time were spent during this visit. All questions were encouraged and answered.

## 2016-09-14 NOTE — Patient Instructions (Signed)
PLEASE CALL ME WITH ANY PROBLEMS OR QUESTIONS [phone])   DISCUSS WITH PHYSICAL ABOUT ? FOOT UP AFO  WORK ON SLOWING IT DOWN AND USING THE CORRECT MECHANICS.

## 2016-09-15 ENCOUNTER — Ambulatory Visit (INDEPENDENT_AMBULATORY_CARE_PROVIDER_SITE_OTHER): Payer: Medicaid Other | Admitting: Clinical

## 2016-09-15 ENCOUNTER — Encounter (HOSPITAL_COMMUNITY): Payer: Self-pay | Admitting: Clinical

## 2016-09-15 DIAGNOSIS — F332 Major depressive disorder, recurrent severe without psychotic features: Secondary | ICD-10-CM | POA: Diagnosis not present
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
## 2016-09-16 ENCOUNTER — Ambulatory Visit: Payer: Medicaid Other | Admitting: Physical Therapy

## 2016-09-16 ENCOUNTER — Other Ambulatory Visit: Payer: Self-pay | Admitting: Neurology

## 2016-09-16 DIAGNOSIS — M6281 Muscle weakness (generalized): Secondary | ICD-10-CM

## 2016-09-16 DIAGNOSIS — R2689 Other abnormalities of gait and mobility: Secondary | ICD-10-CM

## 2016-09-16 DIAGNOSIS — R569 Unspecified convulsions: Secondary | ICD-10-CM

## 2016-09-16 NOTE — Therapy (Signed)
[facility] [address] [place], [place], [address] Phone: [phone]   Fax:  [phone]  Physical Therapy Treatment  Patient Details  Name: [name] Bell MRN: [id number] Date of Birth: 1981-10-25 Referring Provider: Dr. Claudette LawsAndrew Kirsteins  Encounter Date: 09/16/2016      PT End of Session - 09/16/16 1731    Visit Number 11   Number of Visits 24   Date for PT Re-Evaluation 10/07/16   Authorization Type Medicaid   Authorization Time Period 9-8 - 10-07-16   Authorization - Visit Number 10   Authorization - Number of Visits 24   PT Start Time 0804   PT Stop Time 0850   PT Time Calculation (min) 46 min      Past Medical History:  Diagnosis Date  . ADD (attention deficit disorder)    has been on medication  . ADHD (attention deficit hyperactivity disorder)   . Anemia   . Anxiety   . Depression   . H/O suicide attempt    at age 35.   Marland Kitchen. PTSD (post-traumatic stress disorder)   . Seizures (HCC)   . Stroke [facility](HCC)     Past Surgical History:  Procedure Laterality Date  . APPLICATION OF WOUND VAC  02/09/2016   Procedure: APPLICATION OF WOUND VAC;  Surgeon: Jimmye NormanJames [name], MD;  [facility];  Service: General;;  . APPLICATION OF WOUND VAC N/A 02/16/2016   Procedure: RE-APPLICATION OF WOUND VAC;  Surgeon: [name] Thompson, MD;  Location: [facility] OR;  Service: General;  Laterality: N/A;  . BOWEL RESECTION  02/09/2016   Procedure: SMALL BOWEL RESECTION, MESENTERIC REPAIR;  Surgeon: Jimmye NormanJames [name], MD;  [facility];  Service: General;;  . CHEST TUBE INSERTION Right 02/11/2016   Procedure: CHEST TUBE INSERTION;  Surgeon: Jimmye NormanJames [name], MD;  Location: [facility] OR;  Service: General;  Laterality: Right;  . CHEST TUBE INSERTION Right 02/26/2016   Procedure: CHEST TUBE INSERTION;  Surgeon: Kerin PernaPeter Van Trigt, MD;  [facility];  Service: Thoracic;  Laterality: Right;  . CHOLECYSTECTOMY  02/09/2016   Procedure: CHOLECYSTECTOMY;  Surgeon: Jimmye NormanJames  [name], MD;  [facility];  Service: General;;  . ESOPHAGOGASTRODUODENOSCOPY N/A 02/10/2016   Procedure: ESOPHAGOGASTRODUODENOSCOPY (EGD);  Surgeon: Sherrilyn RistHenry L Danis III, MD;  [facility];  Service: Endoscopy;  Laterality: N/A;  . EXTERNAL FIXATION PELVIS  02/09/2016   Procedure: EXTERNAL FIXATION PELVIS;  Surgeon: Myrene GalasMichael [name], MD;  [facility];  Service: Orthopedics;;  . HEMATOMA EVACUATION Right 05/14/2016   Procedure: EVACUATION HEMATOMA;  Surgeon: Alleen BorneBryan K Bartle, MD;  Location: [facility] OR;  Service: Thoracic;  Laterality: Right;  Evacuation of Hematoma Right Chest  . LACERATION REPAIR  02/09/2016   Procedure: REPAIR LIVER LACERATION;  Surgeon: Jimmye NormanJames [name], MD;  [facility];  Service: General;;  . LAPAROTOMY N/A 02/10/2016   Procedure: EXPLORATORY LAPAROTOMY, removal of packs,  cauterization of liver, repacking of liver, and open abdomen vac application;  Surgeon: [name] Thompson, MD;  [facility];  Service: General;  Laterality: N/A;  . LAPAROTOMY N/A 02/11/2016   Procedure: EXPLORATORY LAPAROTOMY VAC CHANGE ;  Surgeon: Jimmye NormanJames [name], MD;  [facility]: [facility] OR;  Service: General;  Laterality: N/A;  . LAPAROTOMY N/A 02/13/2016   Procedure: EXPLORATORY LAPAROTOMY, REMOVAL OF PACKS, ABDOMINAL VAC DRESSING CHANGE;  Surgeon: [name] Thompson, MD;  [facility]: [facility] OR;  Service: General;  Laterality: N/A;  . LAPAROTOMY N/A 02/18/2016   Procedure: EXPLORATORY LAPAROTOMY, PLACEMENT OF ABRA ABDOMINAL WALL CLOSURE SET;  Surgeon:  Jimmye Norman, MD;  [facility];  Service: General;  Laterality: N/A;  . LAPAROTOMY N/A 02/09/2016   Procedure: EXPLORATORY LAPAROTOMY;  Surgeon: Jimmye Norman, MD;  Location: [facility] OR;  Service: General;  Laterality: N/A;  . LAPAROTOMY N/A 02/16/2016   Procedure: EXPLORATORY LAPAROTOMY, ABDOMINAL WASH OUT;  Surgeon: [name], MD;  Location: [facility] OR;  Service: General;  Laterality: N/A;  . PERCUTANEOUS TRACHEOSTOMY N/A 03/01/2016   Procedure: PERCUTANEOUS TRACHEOSTOMY;  Surgeon: Jimmye Norman, MD;  Location: [facility] OR;  Service: General;  Laterality: N/A;  . SACRO-ILIAC PINNING Right 02/16/2016   Procedure: Loyal Gambler;  Surgeon: Myrene Galas, MD;  Location: [facility] OR;  Service: Orthopedics;  Laterality: Right;  . TEE WITHOUT CARDIOVERSION N/A 05/14/2016   Procedure: TRANSESOPHAGEAL ECHOCARDIOGRAM (TEE);  Surgeon: Alleen Borne, MD;  Location[facility] OR;  Service: Thoracic;  Laterality: N/A;  . THORACOTOMY/LOBECTOMY Right 04/26/2016   Procedure: Right THORACOTOMY AND DRAINAGE OF EMPYEMA;  Surgeon: Alleen Borne, MD;  [facility] OR;  Service: Thoracic;  Laterality: Right;  Marland Kitchen VIDEO ASSISTED THORACOSCOPY (VATS)/THOROCOTOMY Right 05/14/2016   Procedure: RIGHT VIDEO ASSISTED THORACOSCOPY (VATS),DRAINAGE OF EMPYEMA;  Surgeon: Alleen Borne, MD;  [facility]: [facility] OR;  Service: Thoracic;  Laterality: Right;  . WOUND DEBRIDEMENT N/A 03/01/2016   Procedure: ABDOMINAL WOUND CLOSURE;  Surgeon: Jimmye Norman, MD;  Location: [facility] OR;  Service: General;  Laterality: N/A;    There were no vitals filed for this visit.      Subjective Assessment - 09/16/16 1718    Subjective Pt states she saw Dr. Riley Kill yesterday - he recommended that she go back to wearing brace on LLE (AFO) - states that this may help to reduce the tingling that she is having in her legs and feet at night   Pertinent History Suicide attempt on 02-09-16 (fall from 5th floor hotel room) with multiple injuries sustained including TBI with LOC, multiple pelvic fractures, L wrist fracture, multiple fractures of ribs R and L sides; liver laceration:  bil. pulmonary contusion:  acute respiratory failure:  cerebral embolism with cerebral infarction and seizure sustained on 05-10-16                                                                   Patient Stated Goals "to get my toes to wiggle and to be able to walk without cane and get up out of chairs easier"   Currently in Pain? No/denies                         [facility] Adult PT  Treatment/Exercise - 09/16/16 0824      Ambulation/Gait   Ambulation/Gait Yes   Ambulation/Gait Assistance 6: Modified independent (Device/Increase time)   Ambulation/Gait Assistance Details steppage gait on LLE persists due to weak dorsiflexors and plantarflexors   Foot up brace used on LLE   Ambulation Distance (Feet) 240 Feet   Assistive device None   Gait Pattern Decreased dorsiflexion - left;Decreased weight shift to left;Decreased step length - left;Decreased hip/knee flexion - left   Ambulation Surface Level;Indoor     Lumbar Exercises: Machines for Strengthening   Leg Press 60# bil. LE's 15 reps:  LLE only 35# 2 sets 10 reps  Lumbar Exercises: Standing   Heel Raises 10 reps  bil. LE's with > weight on LLE than on RLE     Lumbar Exercises: Sidelying   Clam 20 reps  6# used on LLE   Hip Abduction 10 reps     Knee/Hip Exercises: Aerobic   Recumbent Bike Nustep level 6 x 5" with UE's and LE's     Knee/Hip Exercises: Standing   Forward Step Up Left;1 set;Step Height: 6";10 reps   Step Down Left;1 set;5 reps;Step Height: 6"     Knee/Hip Exercises: Prone   Straight Leg Raises AROM;Left;1 set;10 reps  with knee flexed at 90 degrees      Pt performed sidestepping inside // bars with red theraband 10' x 4 reps for L hip abductor strengthening  L heel cord stretch - using a 2" block - 30 sec hold x 1 rep due to time constraint:  Emphasized importance of doing This exercise at home on regular basis        PT Education - 09/16/16 1730    Education provided Yes   Education Details Pt given grey band for hip abduction supine (hooklying); red band given for sidestepping for hip abductor strengthening   Person(s) Educated Patient   Methods Explanation   Comprehension Verbalized understanding;Returned demonstration          PT Short Term Goals - 08/30/16 2025      PT SHORT TERM GOAL #1   Title Improve TUG score to </= 18 secs with LBQC to demonstrate improved  functional mobility.  (07-29-16)   Baseline 13.88 with no device - on 08-17-16   Time 4   Period Weeks   Status Achieved     PT SHORT TERM GOAL #2   Title Improve gait velocity to >/= 2.0 ft/sec with [facility] for incr. gait efficiency.  (07-29-16)   Baseline 15.10 secs with tripod based  = 2.15 ft/sec with cane on 08-18-16   Time 4   Period Weeks   Status Achieved     PT SHORT TERM GOAL #3   Title Amb. 100' with min assist with single point cane on flat, even surface.  (07-29-16)   Baseline Pt able to amb. with cane 120' with SBA  (08-17-16)   Time 4   Period Weeks   Status Achieved     PT SHORT TERM GOAL #4   Title Increase Berg balance test score by at least 5 points to reduce fall risk.  (07-29-16)   Baseline Berg test to be completed next session - Not tested due to time constraint at evaluation   Status On-going     PT SHORT TERM GOAL #5   Title Independent in HEP for LLE strengthening and stretching.  (07-29-16)   Status Achieved           PT Long Term Goals - 06/28/16 1629      PT LONG TERM GOAL #1   Title Improve TUG score to </= 14.5 secs with LBQC to reduce fall risk.  (09-27-16)   Baseline 23.81 secs with LB[facility]   Time 12   Period Weeks   Status New     PT LONG TERM GOAL #2   Title Improve gait velocity to >/= 2.5 ft/sec with single point cane for incr. gait efficiency.  (09-27-16)   Baseline 1.59 ft/sec with [facility]   Time 12   Period Weeks   Status New     PT LONG TERM GOAL #3   Title Modified  independent with household ambulation without device with AFO.  (09-27-16)   Baseline Pt requires use of LBQC for assist. with ambulation - with supervision for safety   Time 12   Period Weeks   Status New     PT LONG TERM GOAL #4   Title Amb. 1000' with single point cane with S on flat even and uneven surfaces for incr. community accessibility.  (09-27-16)   Baseline 200' with SBA on uneven surface with [facility]QC   Time 12   Period Weeks   Status New     PT LONG TERM  GOAL #5   Title Verbalize understanding of options for community exercise programs upon D/C from PT.  (09-27-16)   Baseline Dependent   Time 12   Period Weeks   Status New               Plan - 09/16/16 1732    Clinical Impression Statement Pt has improved gait pattern with use of Foot Up brace on LLE due to increased dorsiflexion provided with this orthotic;  pt continues to have L hip abductor weakness resulting in difficulty holding L thigh in midline during step descension and with seated recumbent bike exercise, resulting in LLE adduction due to weakness                                                                   pt much improved with rolling supine to prone and then from prone to partial quadriped for placing pillow under hips for hip ext. exercise   Rehab Potential Good   PT Frequency 2x / week   PT Duration 12 weeks   PT Treatment/Interventions ADLs/Self Care Home Management;DME Instruction;Gait training;Stair training;Functional mobility training;Orthotic Fit/Training;Patient/family education;Neuromuscular re-education;Balance training;Therapeutic exercise;Therapeutic activities   PT Next Visit Plan Core strengthening; L hip abductor strengthening   PT Home Exercise Plan LLE strengthening   Consulted and Agree with Plan of Care Patient      Patient will benefit from skilled therapeutic intervention in order to improve the following deficits and impairments:  Abnormal gait, Decreased balance, Decreased coordination, Decreased endurance, Pain, Decreased mobility, Decreased strength, Decreased skin integrity, Impaired sensation, Impaired tone  Visit Diagnosis: Muscle weakness (generalized)  Other abnormalities of gait and mobility     Problem List Patient Active Problem List   Diagnosis Date Noted  . Seizures (HCC) 06/18/2016  . Hx of suicide attempt   . 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 Left  hemiparesis (HCC)   . S/P thoracotomy 05/14/2016  . Cerebral embolism with cerebral infarction 05/11/2016  . 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 Multiple pelvic fractures (HCC) 03/03/2016  . Left wrist fracture 03/03/2016  . Multiple fractures of ribs of right side 03/03/2016  . Fracture of multiple ribs of left side 03/03/2016  . Liver laceration 03/03/2016  . Small intestine injury 03/03/2016  . Acute blood loss anemia 03/03/2016  . Suicide attempt 03/03/2016  . Bilateral pulmonary contusion 03/03/2016  . Acute respiratory failure (HCC) 03/03/2016  . Hypokalemia 03/03/2016  . Hypernatremia 03/03/2016  . Acute kidney injury (HCC) 03/03/2016  . Ingestion of caustic substance 03/03/2016  . Tylenol overdose  03/03/2016  . Hyperglycemia 03/03/2016  . Hepatic failure (HCC) 03/03/2016  . Pressure ulcer 02/26/2016  . Fall from, out of or through building, not otherwise specified, initial encounter 02/09/2016  . Hypovolemic shock (HCC) 02/09/2016    Benedicto Capozzi, Donavan BurnetLinda Suzanne, PT 09/16/2016, 5:42 PM  [facility] [address] [place], [place], [address] Phone: [phone]   Fax:  [phone]  Name: [name] Bell MRN: [id number] Date of Birth: 1980/11/15

## 2016-09-17 ENCOUNTER — Ambulatory Visit: Payer: Self-pay | Admitting: Family Medicine

## 2016-09-20 ENCOUNTER — Ambulatory Visit: Payer: Medicaid Other | Admitting: Physical Therapy

## 2016-09-20 DIAGNOSIS — M6281 Muscle weakness (generalized): Secondary | ICD-10-CM

## 2016-09-20 DIAGNOSIS — R2689 Other abnormalities of gait and mobility: Secondary | ICD-10-CM | POA: Diagnosis not present

## 2016-09-20 NOTE — Therapy (Signed)
[facility] [address] [place], [place], [address] Phone: [phone]   Fax:  [phone]  Physical Therapy Treatment  Patient Details  Name: [name] BobKatia D [place] MRN: [id number] Date of Birth: 1981-10-16 Referring Provider: Dr. Claudette LawsAndrew Kirsteins  Encounter Date: 09/20/2016      PT End of Session - 09/20/16 1929    Visit Number 12   Number of Visits 24   Date for PT Re-Evaluation 10/07/16   Authorization Type Medicaid   Authorization Time Period 9-8 - 10-07-16   Authorization - Visit Number 11   Authorization - Number of Visits 24   PT Start Time 0801   PT Stop Time 0846   PT Time Calculation (min) 45 min      Past Medical History:  Diagnosis Date  . ADD (attention deficit disorder)    has been on medication  . ADHD (attention deficit hyperactivity disorder)   . Anemia   . Anxiety   . Depression   . H/O suicide attempt    at age 35.   Marland Kitchen. PTSD (post-traumatic stress disorder)   . Seizures (HCC)   . Stroke [facility](HCC)     Past Surgical History:  Procedure Laterality Date  . APPLICATION OF WOUND VAC  02/09/2016   Procedure: APPLICATION OF WOUND VAC;  Surgeon: Jimmye NormanJames [name], MD;  [facility];  Service: General;;  . APPLICATION OF WOUND VAC N/A 02/16/2016   Procedure: RE-APPLICATION OF WOUND VAC;  Surgeon: [name] Thompson, MD;  Location: [facility] OR;  Service: General;  Laterality: N/A;  . BOWEL RESECTION  02/09/2016   Procedure: SMALL BOWEL RESECTION, MESENTERIC REPAIR;  Surgeon: Jimmye NormanJames [name], MD;  [facility];  Service: General;;  . CHEST TUBE INSERTION Right 02/11/2016   Procedure: CHEST TUBE INSERTION;  Surgeon: Jimmye NormanJames [name], MD;  Location: [facility] OR;  Service: General;  Laterality: Right;  . CHEST TUBE INSERTION Right 02/26/2016   Procedure: CHEST TUBE INSERTION;  Surgeon: Kerin PernaPeter Van Trigt, MD;  [facility];  Service: Thoracic;  Laterality: Right;  . CHOLECYSTECTOMY  02/09/2016   Procedure: CHOLECYSTECTOMY;  Surgeon: Jimmye NormanJames  [name], MD;  [facility];  Service: General;;  . ESOPHAGOGASTRODUODENOSCOPY N/A 02/10/2016   Procedure: ESOPHAGOGASTRODUODENOSCOPY (EGD);  Surgeon: Sherrilyn RistHenry L Danis III, MD;  [facility];  Service: Endoscopy;  Laterality: N/A;  . EXTERNAL FIXATION PELVIS  02/09/2016   Procedure: EXTERNAL FIXATION PELVIS;  Surgeon: Myrene GalasMichael [name], MD;  [facility];  Service: Orthopedics;;  . HEMATOMA EVACUATION Right 05/14/2016   Procedure: EVACUATION HEMATOMA;  Surgeon: Alleen BorneBryan K Bartle, MD;  Location: [facility] OR;  Service: Thoracic;  Laterality: Right;  Evacuation of Hematoma Right Chest  . LACERATION REPAIR  02/09/2016   Procedure: REPAIR LIVER LACERATION;  Surgeon: Jimmye NormanJames [name], MD;  [facility];  Service: General;;  . LAPAROTOMY N/A 02/10/2016   Procedure: EXPLORATORY LAPAROTOMY, removal of packs,  cauterization of liver, repacking of liver, and open abdomen vac application;  Surgeon: [name] Thompson, MD;  [facility];  Service: General;  Laterality: N/A;  . LAPAROTOMY N/A 02/11/2016   Procedure: EXPLORATORY LAPAROTOMY VAC CHANGE ;  Surgeon: Jimmye NormanJames [name], MD;  Location: [facility] OR;  Service: General;  Laterality: N/A;  . LAPAROTOMY N/A 02/13/2016   Procedure: EXPLORATORY LAPAROTOMY, REMOVAL OF PACKS, ABDOMINAL VAC DRESSING CHANGE;  Surgeon: [name] Thompson, MD;  Location: [facility] OR;  Service: General;  Laterality: N/A;  . LAPAROTOMY N/A 02/18/2016   Procedure: EXPLORATORY LAPAROTOMY, PLACEMENT OF ABRA ABDOMINAL WALL CLOSURE SET;  Surgeon:  Jimmye Norman, MD;  Location: [facility] OR;  Service: General;  Laterality: N/A;  . LAPAROTOMY N/A 02/09/2016   Procedure: EXPLORATORY LAPAROTOMY;  Surgeon: Jimmye Norman, MD;  Location: [facility] OR;  Service: General;  Laterality: N/A;  . LAPAROTOMY N/A 02/16/2016   Procedure: EXPLORATORY LAPAROTOMY, ABDOMINAL WASH OUT;  Surgeon: [name], MD;  Location: [facility] OR;  Service: General;  Laterality: N/A;  . PERCUTANEOUS TRACHEOSTOMY N/A 03/01/2016   Procedure: PERCUTANEOUS TRACHEOSTOMY;  Surgeon: Jimmye Norman, MD;  Location: [facility];  Service: General;  Laterality: N/A;  . SACRO-ILIAC PINNING Right 02/16/2016   Procedure: Loyal Gambler;  Surgeon: Myrene Galas, MD;  Location: [facility] OR;  Service: Orthopedics;  Laterality: Right;  . TEE WITHOUT CARDIOVERSION N/A 05/14/2016   Procedure: TRANSESOPHAGEAL ECHOCARDIOGRAM (TEE);  Surgeon: Alleen Borne, MD;  Location: [facility] OR;  Service: Thoracic;  Laterality: N/A;  . THORACOTOMY/LOBECTOMY Right 04/26/2016   Procedure: Right THORACOTOMY AND DRAINAGE OF EMPYEMA;  Surgeon: Alleen Borne, MD;  [facility] OR;  Service: Thoracic;  Laterality: Right;  Marland Kitchen VIDEO ASSISTED THORACOSCOPY (VATS)/THOROCOTOMY Right 05/14/2016   Procedure: RIGHT VIDEO ASSISTED THORACOSCOPY (VATS),DRAINAGE OF EMPYEMA;  Surgeon: Alleen Borne, MD;  Location: [facility] OR;  Service: Thoracic;  Laterality: Right;  . WOUND DEBRIDEMENT N/A 03/01/2016   Procedure: ABDOMINAL WOUND CLOSURE;  Surgeon: Jimmye Norman, MD;  Location: [facility] OR;  Service: General;  Laterality: N/A;    There were no vitals filed for this visit.      Subjective Assessment - 09/20/16 1920    Subjective Pt reports she is feeling good today   Pertinent History Suicide attempt on 02-09-16 (fall from 5th floor hotel room) with multiple injuries sustained including TBI with LOC, multiple pelvic fractures, L wrist fracture, multiple fractures of ribs R and L sides; liver laceration:  bil. pulmonary contusion:  acute respiratory failure:  cerebral embolism with cerebral infarction and seizure sustained on 05-10-16                                                                   How long can you sit comfortably? 30" - then start moving to try to get more comfortable   Patient Stated Goals "to get my toes to wiggle and to be able to walk without cane and get up out of chairs easier"   Currently in Pain? No/denies                         [facility] Adult PT Treatment/Exercise - 09/20/16 0815      Ambulation/Gait   Ambulation/Gait  Yes   Ambulation/Gait Assistance 5: Supervision   Ambulation/Gait Assistance Details foot up brace used on LLE   Ambulation Distance (Feet) 120 Feet   Assistive device None   Gait Pattern Decreased dorsiflexion - left;Decreased weight shift to left;Decreased step length - left;Decreased hip/knee flexion - left   Ambulation Surface Level;Indoor     Lumbar Exercises: Standing   Heel Raises 10 reps  bil. LE's with > weight on LLE than on RLE     Lumbar Exercises: Sidelying   Clam 20 reps  6# used on LLE   Hip Abduction 20 reps  no weight     Lumbar Exercises: Quadruped   Plank 5  reps modified plank on knees - 3 sec hold     Knee/Hip Exercises: Aerobic   Recumbent Bike Nustep level 7 x 5" with UE's and LE's     Knee/Hip Exercises: Plyometrics   Bilateral Jumping 1 set;10 reps     TherAct; 1/2 jumping jacks x 10 reps; lunges x 5 reps alternating lead leg inside // bars with UE support prn Amb. 10' x 2 inside bars on tip toes with knees flexed; amb. Sideways inside bars with knees flexed on tip toes 10' x 2 reps  TherEx:  Pt performed "fire hydrant" exercise in quadriped position for L hip abdct. Strengthening x 10 reps          PT Short Term Goals - 08/30/16 2025      PT SHORT TERM GOAL #1   Title Improve TUG score to </= 18 secs with LBQC to demonstrate improved functional mobility.  (07-29-16)   Baseline 13.88 with no device - on 08-17-16   Time 4   Period Weeks   Status Achieved     PT SHORT TERM GOAL #2   Title Improve gait velocity to >/= 2.0 ft/sec with [facility] for incr. gait efficiency.  (07-29-16)   Baseline 15.10 secs with tripod based  = 2.15 ft/sec with cane on 08-18-16   Time 4   Period Weeks   Status Achieved     PT SHORT TERM GOAL #3   Title Amb. 100' with min assist with single point cane on flat, even surface.  (07-29-16)   Baseline Pt able to amb. with cane 120' with SBA  (08-17-16)   Time 4   Period Weeks   Status Achieved     PT SHORT TERM GOAL #4    Title Increase Berg balance test score by at least 5 points to reduce fall risk.  (07-29-16)   Baseline Berg test to be completed next session - Not tested due to time constraint at evaluation   Status On-going     PT SHORT TERM GOAL #5   Title Independent in HEP for LLE strengthening and stretching.  (07-29-16)   Status Achieved           PT Long Term Goals - 06/28/16 1629      PT LONG TERM GOAL #1   Title Improve TUG score to </= 14.5 secs with LBQC to reduce fall risk.  (09-27-16)   Baseline 23.81 secs with LBQC   Time 12   Period Weeks   Status New     PT LONG TERM GOAL #2   Title Improve gait velocity to >/= 2.5 ft/sec with single point cane for incr. gait efficiency.  (09-27-16)   Baseline 1.59 ft/sec with [facility]   Time 12   Period Weeks   Status New     PT LONG TERM GOAL #3   Title Modified independent with household ambulation without device with AFO.  (09-27-16)   Baseline Pt requires use of LBQC for assist. with ambulation - with supervision for safety   Time 12   Period Weeks   Status New     PT LONG TERM GOAL #4   Title Amb. 1000' with single point cane with S on flat even and uneven surfaces for incr. community accessibility.  (09-27-16)   Baseline 200' with SBA on uneven surface with [facility]   Time 12   Period Weeks   Status New     PT LONG TERM GOAL #5   Title Verbalize understanding of  options for community exercise programs upon D/C from PT.  (09-27-16)   Baseline Dependent   Time 12   Period Weeks   Status New               Plan - 09/20/16 1929    Clinical Impression Statement Pt continues to progress well towards goals; pt demonstrating improved L hip abduction strength as evidenced by increased L hip abdct. AROM   Rehab Potential Good   PT Frequency 2x / week   PT Duration 12 weeks   PT Treatment/Interventions ADLs/Self Care Home Management;DME Instruction;Gait training;Stair training;Functional mobility training;Orthotic  Fit/Training;Patient/family education;Neuromuscular re-education;Balance training;Therapeutic exercise;Therapeutic activities   PT Next Visit Plan Core strengthening; L hip abductor strengthening   PT Home Exercise Plan LLE strengthening   Consulted and Agree with Plan of Care Patient      Patient will benefit from skilled therapeutic intervention in order to improve the following deficits and impairments:  Abnormal gait, Decreased balance, Decreased coordination, Decreased endurance, Pain, Decreased mobility, Decreased strength, Decreased skin integrity, Impaired sensation, Impaired tone  Visit Diagnosis: Other abnormalities of gait and mobility  Muscle weakness (generalized)     Problem List Patient Active Problem List   Diagnosis Date Noted  . Seizures (HCC) 06/18/2016  . Hx of suicide attempt   . 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 Left hemiparesis (HCC)   . S/P thoracotomy 05/14/2016  . Cerebral embolism with cerebral infarction 05/11/2016  . 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 Multiple pelvic fractures (HCC) 03/03/2016  . Left wrist fracture 03/03/2016  . Multiple fractures of ribs of right side 03/03/2016  . Fracture of multiple ribs of left side 03/03/2016  . Liver laceration 03/03/2016  . Small intestine injury 03/03/2016  . Acute blood loss anemia 03/03/2016  . Suicide attempt 03/03/2016  . Bilateral pulmonary contusion 03/03/2016  . Acute respiratory failure (HCC) 03/03/2016  . Hypokalemia 03/03/2016  . Hypernatremia 03/03/2016  . Acute kidney injury (HCC) 03/03/2016  . Ingestion of caustic substance 03/03/2016  . Tylenol overdose 03/03/2016  . Hyperglycemia 03/03/2016  . Hepatic failure (HCC) 03/03/2016  . Pressure ulcer 02/26/2016  . Fall from, out of or through building, not otherwise specified, initial encounter 02/09/2016  . Hypovolemic  shock (HCC) 02/09/2016    Keonda Dow, Donavan Burnet, PT 09/20/2016, 7:39 PM  [facility] [address] [place], [place], [address] Phone: [phone]   Fax:  [phone]  Name: KYRSTEN DELEEUW MRN: [id number] Date of Birth: 1980-11-11

## 2016-09-23 ENCOUNTER — Ambulatory Visit: Payer: Medicaid Other | Admitting: Physical Therapy

## 2016-09-23 DIAGNOSIS — M6281 Muscle weakness (generalized): Secondary | ICD-10-CM

## 2016-09-23 DIAGNOSIS — R2689 Other abnormalities of gait and mobility: Secondary | ICD-10-CM | POA: Diagnosis not present

## 2016-09-23 NOTE — Therapy (Signed)
[facility] [address] [place], [place], [address] Phone: [phone]   Fax:  [phone]  Physical Therapy Treatment  Patient Details  Name: [name] Bell MRN: [id number] Date of Birth: 1981/06/25 Referring Provider: Dr. Claudette Laws  Encounter Date: 09/23/2016      PT End of Session - 09/23/16 0846    Visit Number 13   Number of Visits 24   Date for PT Re-Evaluation 10/07/16   Authorization Type Medicaid   Authorization Time Period 9-8 - 10-07-16   Authorization - Visit Number 12   Authorization - Number of Visits 24   PT Start Time 0802   PT Stop Time 0846   PT Time Calculation (min) 44 min      Past Medical History:  Diagnosis Date  . ADD (attention deficit disorder)    has been on medication  . ADHD (attention deficit hyperactivity disorder)   . Anemia   . Anxiety   . Depression   . H/O suicide attempt    at age 15.   Marland Kitchen PTSD (post-traumatic stress disorder)   . Seizures (HCC)   . Stroke [facility])     Past Surgical History:  Procedure Laterality Date  . APPLICATION OF WOUND VAC  02/09/2016   Procedure: APPLICATION OF WOUND VAC;  Surgeon: Jimmye Norman, MD;  Location: [facility] OR;  Service: General;;  . APPLICATION OF WOUND VAC N/A 02/16/2016   Procedure: RE-APPLICATION OF WOUND VAC;  Surgeon: [name], MD;  [facility]: [facility] OR;  Service: General;  Laterality: N/A;  . BOWEL RESECTION  02/09/2016   Procedure: SMALL BOWEL RESECTION, MESENTERIC REPAIR;  Surgeon: Jimmye Norman, MD;  Location: [facility] OR;  Service: General;;  . CHEST TUBE INSERTION Right 02/11/2016   Procedure: CHEST TUBE INSERTION;  Surgeon: Jimmye Norman, MD;  Location: [facility] OR;  Service: General;  Laterality: Right;  . CHEST TUBE INSERTION Right 02/26/2016   Procedure: CHEST TUBE INSERTION;  Surgeon: Kerin Perna, MD;  Location: [facility] OR;  Service: Thoracic;  Laterality: Right;  . CHOLECYSTECTOMY  02/09/2016   Procedure: CHOLECYSTECTOMY;  Surgeon: Jimmye Norman, MD;  Location: [facility] OR;  Service: General;;  . ESOPHAGOGASTRODUODENOSCOPY N/A 02/10/2016   Procedure: ESOPHAGOGASTRODUODENOSCOPY (EGD);  Surgeon: Sherrilyn Rist, MD;  Location: [facility] ENDOSCOPY;  Service: Endoscopy;  Laterality: N/A;  . EXTERNAL FIXATION PELVIS  02/09/2016   Procedure: EXTERNAL FIXATION PELVIS;  Surgeon: Myrene Galas, MD;  Location: [facility] OR;  Service: Orthopedics;;  . HEMATOMA EVACUATION Right 05/14/2016   Procedure: EVACUATION HEMATOMA;  Surgeon: Alleen Borne, MD;  [facility]: [facility] OR;  Service: Thoracic;  Laterality: Right;  Evacuation of Hematoma Right Chest  . LACERATION REPAIR  02/09/2016   Procedure: REPAIR LIVER LACERATION;  Surgeon: Jimmye Norman, MD;  Location: [facility] OR;  Service: General;;  . LAPAROTOMY N/A 02/10/2016   Procedure: EXPLORATORY LAPAROTOMY, removal of packs,  cauterization of liver, repacking of liver, and open abdomen vac application;  Surgeon: [name], MD;  Location: [facility] OR;  Service: General;  Laterality: N/A;  . LAPAROTOMY N/A 02/11/2016   Procedure: EXPLORATORY LAPAROTOMY VAC CHANGE ;  Surgeon: Jimmye Norman, MD;  [facility] OR;  Service: General;  Laterality: N/A;  . LAPAROTOMY N/A 02/13/2016   Procedure: EXPLORATORY LAPAROTOMY, REMOVAL OF PACKS, ABDOMINAL VAC DRESSING CHANGE;  Surgeon: [name], MD;  [facility]: [facility] OR;  Service: General;  Laterality: N/A;  . LAPAROTOMY N/A 02/18/2016   Procedure: EXPLORATORY LAPAROTOMY, PLACEMENT OF ABRA ABDOMINAL WALL CLOSURE SET;  Surgeon:  Jimmye NormanJames [name], MD;  [facility];  Service: General;  Laterality: N/A;  . LAPAROTOMY N/A 02/09/2016   Procedure: EXPLORATORY LAPAROTOMY;  Surgeon: Jimmye NormanJames [name], MD;  [facility];  Service: General;  Laterality: N/A;  . LAPAROTOMY N/A 02/16/2016   Procedure: EXPLORATORY LAPAROTOMY, ABDOMINAL WASH OUT;  Surgeon: [name] Thompson, MD;  [facility];  Service: General;  Laterality: N/A;  . PERCUTANEOUS TRACHEOSTOMY N/A 03/01/2016   Procedure: PERCUTANEOUS TRACHEOSTOMY;  Surgeon: Jimmye NormanJames  [name], MD;  [facility];  Service: General;  Laterality: N/A;  . SACRO-ILIAC PINNING Right 02/16/2016   Procedure: Loyal GamblerSACRO-ILIAC PINNING;  Surgeon: Myrene GalasMichael [name], MD;  [facility];  Service: Orthopedics;  Laterality: Right;  . TEE WITHOUT CARDIOVERSION N/A 05/14/2016   Procedure: TRANSESOPHAGEAL ECHOCARDIOGRAM (TEE);  Surgeon: Alleen BorneBryan K Bartle, MD;  [facility];  Service: Thoracic;  Laterality: N/A;  . THORACOTOMY/LOBECTOMY Right 04/26/2016   Procedure: Right THORACOTOMY AND DRAINAGE OF EMPYEMA;  Surgeon: Alleen BorneBryan K Bartle, MD;  [facility]: [facility] OR;  Service: Thoracic;  Laterality: Right;  Marland Kitchen. VIDEO ASSISTED THORACOSCOPY (VATS)/THOROCOTOMY Right 05/14/2016   Procedure: RIGHT VIDEO ASSISTED THORACOSCOPY (VATS),DRAINAGE OF EMPYEMA;  Surgeon: Alleen BorneBryan K Bartle, MD;  [facility]: [facility] OR;  Service: Thoracic;  Laterality: Right;  . WOUND DEBRIDEMENT N/A 03/01/2016   Procedure: ABDOMINAL WOUND CLOSURE;  Surgeon: Jimmye NormanJames [name], MD;  [facility];  Service: General;  Laterality: N/A;    There were no vitals filed for this visit.      Subjective Assessment - 09/23/16 1032    Subjective Pt reports she overdid it a little at last treatment session - felt it in her L hip when she got home   Pertinent History Suicide attempt on 02-09-16 (fall from 5th floor hotel room) with multiple injuries sustained including TBI with LOC, multiple pelvic fractures, L wrist fracture, multiple fractures of ribs R and L sides; liver laceration:  bil. pulmonary contusion:  acute respiratory failure:  cerebral embolism with cerebral infarction and seizure sustained on 05-10-16                                                                   How long can you sit comfortably? 30" - then start moving to try to get more comfortable   How long can you stand comfortably? 15-20"   How long can you walk comfortably? 200'   Patient Stated Goals "to get my toes to wiggle and to be able to walk without cane and get up out of chairs easier"   Currently  in Pain? Other (Comment)  L hip soreness with certain movements     TherEx:  L dorsiflexion with LLE off side of mat - L foot on 4" block for flexor syngery pattern - 10 reps 3 sec hold L hip extension control 10 reps off side of mat L 1/2 bridge x 5 reps; full bridge x 10 reps Standing on L tip toes with UE support on treadmill bar - slowly moving RLE up/back x 10 reps               [facility] Adult PT Treatment/Exercise - 09/23/16 0808      Lumbar Exercises: Stretches   Active Hamstring Stretch 1 rep;30 seconds     Lumbar Exercises: Standing  Heel Raises 10 reps  bil. LE's with > weight on LLE than on RLE     Lumbar Exercises: Sidelying   Clam 10 reps  5#   Hip Abduction 10 reps     Knee/Hip Exercises: Stretches   Gastroc Stretch Left;1 rep;30 seconds     Knee/Hip Exercises: Aerobic   Recumbent Bike Nustep level 5 x 6" with UE's and LE's     Knee/Hip Exercises: Standing   Rocker Board Other (comment)  10 reps with UE support ; 10 reps without support                PT Education - 09/23/16 0841    Education provided Yes   Education Details instructed pt to try picking up marbles with L toes to fascilitate toe flexion   Person(s) Educated Patient   Methods Explanation;Demonstration   Comprehension Verbalized understanding          PT Short Term Goals - 08/30/16 2025      PT SHORT TERM GOAL #1   Title Improve TUG score to </= 18 secs with LBQC to demonstrate improved functional mobility.  (07-29-16)   Baseline 13.88 with no device - on 08-17-16   Time 4   Period Weeks   Status Achieved     PT SHORT TERM GOAL #2   Title Improve gait velocity to >/= 2.0 ft/sec with [facility] for incr. gait efficiency.  (07-29-16)   Baseline 15.10 secs with tripod based  = 2.15 ft/sec with cane on 08-18-16   Time 4   Period Weeks   Status Achieved     PT SHORT TERM GOAL #3   Title Amb. 100' with min assist with single point cane on flat, even surface.  (07-29-16)    Baseline Pt able to amb. with cane 120' with SBA  (08-17-16)   Time 4   Period Weeks   Status Achieved     PT SHORT TERM GOAL #4   Title Increase Berg balance test score by at least 5 points to reduce fall risk.  (07-29-16)   Baseline Berg test to be completed next session - Not tested due to time constraint at evaluation   Status On-going     PT SHORT TERM GOAL #5   Title Independent in HEP for LLE strengthening and stretching.  (07-29-16)   Status Achieved           PT Long Term Goals - 06/28/16 1629      PT LONG TERM GOAL #1   Title Improve TUG score to </= 14.5 secs with [facility] to reduce fall risk.  (09-27-16)   Baseline 23.81 secs with [facility]   Time 12   Period Weeks   Status New     PT LONG TERM GOAL #2   Title Improve gait velocity to >/= 2.5 ft/sec with single point cane for incr. gait efficiency.  (09-27-16)   Baseline 1.59 ft/sec with [facility]   Time 12   Period Weeks   Status New     PT LONG TERM GOAL #3   Title Modified independent with household ambulation without device with AFO.  (09-27-16)   Baseline Pt requires use of LBQC for assist. with ambulation - with supervision for safety   Time 12   Period Weeks   Status New     PT LONG TERM GOAL #4   Title Amb. 1000' with single point cane with S on flat even and uneven surfaces for incr. community accessibility.  (09-27-16)  Baseline 200' with SBA on uneven surface with [facility]   Time 12   Period Weeks   Status New     PT LONG TERM GOAL #5   Title Verbalize understanding of options for community exercise programs upon D/C from PT.  (09-27-16)   Baseline Dependent   Time 12   Period Weeks   Status New               Plan - 09/23/16 1012    Clinical Impression Statement Pt reporting more L hip discomfort today - limiting exercises requiring active flexion and active abduction; pt requested to "not overdo it":  amb. on tip toes appeared to significantly increase L hip pain so this activity was stopped  early; pt increasing strength in L plantarflexors demonstrating increased active plantarflexion ROM                                                                                                                   Rehab Potential Good   PT Frequency 2x / week   PT Duration 12 weeks   PT Treatment/Interventions ADLs/Self Care Home Management;DME Instruction;Gait training;Stair training;Functional mobility training;Orthotic Fit/Training;Patient/family education;Neuromuscular re-education;Balance training;Therapeutic exercise;Therapeutic activities   PT Next Visit Plan Core strengthening; L hip abductor strengthening   PT Home Exercise Plan LLE strengthening   Consulted and Agree with Plan of Care Patient      Patient will benefit from skilled therapeutic intervention in order to improve the following deficits and impairments:  Abnormal gait, Decreased balance, Decreased coordination, Decreased endurance, Pain, Decreased mobility, Decreased strength, Decreased skin integrity, Impaired sensation, Impaired tone  Visit Diagnosis: Muscle weakness (generalized)     Problem List Patient Active Problem List   Diagnosis Date Noted  . Seizures (HCC) 06/18/2016  . Hx of suicide attempt   . 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 Left hemiparesis (HCC)   . S/P thoracotomy 05/14/2016  . Cerebral embolism with cerebral infarction 05/11/2016  . 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 Multiple pelvic fractures (HCC) 03/03/2016  . Left wrist fracture 03/03/2016  . Multiple fractures of ribs of right side 03/03/2016  . Fracture of multiple ribs of left side 03/03/2016  . Liver laceration 03/03/2016  . Small intestine injury 03/03/2016  . Acute blood loss anemia 03/03/2016  . Suicide attempt 03/03/2016  . Bilateral pulmonary contusion 03/03/2016  . Acute respiratory failure (HCC)  03/03/2016  . Hypokalemia 03/03/2016  . Hypernatremia 03/03/2016  . Acute kidney injury (HCC) 03/03/2016  . Ingestion of caustic substance 03/03/2016  . Tylenol overdose 03/03/2016  . Hyperglycemia 03/03/2016  . Hepatic failure (HCC) 03/03/2016  . Pressure ulcer 02/26/2016  . Fall from, out of or through building, not otherwise specified, initial encounter 02/09/2016  . Hypovolemic shock (HCC) 02/09/2016    Morgaine Kimball, Donavan BurnetLinda Suzanne, PT 09/23/2016, 10:33 AM  [facility] [address] [place], [place],  [address] Phone: [phone]   Fax:  [phone]  Name: SURY WENTWORTH MRN: [id number] Date of Birth: 07/13/1981

## 2016-09-24 ENCOUNTER — Ambulatory Visit: Payer: Medicaid Other | Attending: Family Medicine | Admitting: Family Medicine

## 2016-09-24 ENCOUNTER — Encounter: Payer: Self-pay | Admitting: Family Medicine

## 2016-09-24 VITALS — BP 107/71 | HR 67 | Temp 97.9°F | Ht 72.0 in | Wt 223.6 lb

## 2016-09-24 DIAGNOSIS — Z8673 Personal history of transient ischemic attack (TIA), and cerebral infarction without residual deficits: Secondary | ICD-10-CM | POA: Diagnosis not present

## 2016-09-24 DIAGNOSIS — Z7982 Long term (current) use of aspirin: Secondary | ICD-10-CM | POA: Diagnosis not present

## 2016-09-24 DIAGNOSIS — N6311 Unspecified lump in the right breast, upper outer quadrant: Secondary | ICD-10-CM | POA: Insufficient documentation

## 2016-09-24 DIAGNOSIS — N6310 Unspecified lump in the right breast, unspecified quadrant: Secondary | ICD-10-CM

## 2016-09-24 DIAGNOSIS — Z79899 Other long term (current) drug therapy: Secondary | ICD-10-CM | POA: Insufficient documentation

## 2016-09-24 DIAGNOSIS — G629 Polyneuropathy, unspecified: Secondary | ICD-10-CM | POA: Insufficient documentation

## 2016-09-24 DIAGNOSIS — F332 Major depressive disorder, recurrent severe without psychotic features: Secondary | ICD-10-CM | POA: Insufficient documentation

## 2016-09-24 DIAGNOSIS — L292 Pruritus vulvae: Secondary | ICD-10-CM | POA: Diagnosis not present

## 2016-09-24 DIAGNOSIS — E7800 Pure hypercholesterolemia, unspecified: Secondary | ICD-10-CM | POA: Insufficient documentation

## 2016-09-24 DIAGNOSIS — Z9889 Other specified postprocedural states: Secondary | ICD-10-CM | POA: Diagnosis not present

## 2016-09-24 DIAGNOSIS — E785 Hyperlipidemia, unspecified: Secondary | ICD-10-CM | POA: Insufficient documentation

## 2016-09-24 DIAGNOSIS — Z9049 Acquired absence of other specified parts of digestive tract: Secondary | ICD-10-CM | POA: Diagnosis not present

## 2016-09-24 DIAGNOSIS — F339 Major depressive disorder, recurrent, unspecified: Secondary | ICD-10-CM | POA: Diagnosis not present

## 2016-09-24 DIAGNOSIS — R569 Unspecified convulsions: Secondary | ICD-10-CM | POA: Diagnosis not present

## 2016-09-24 MED ORDER — ATORVASTATIN CALCIUM 20 MG PO TABS: 20.0000 mg | ORAL_TABLET | Freq: Every day | ORAL | 3 refills | Status: DC

## 2016-09-24 MED ORDER — GABAPENTIN 300 MG PO CAPS: 300.0000 mg | ORAL_CAPSULE | Freq: Two times a day (BID) | ORAL | 3 refills | Status: DC

## 2016-09-24 NOTE — Progress Notes (Signed)
Subjective:  Patient ID: [name] BobKatia D Dubiel, female    DOB: 1981-08-17  Age: 35 y.o. MRN: [id number]  CC: Follow-up (uti); Vaginal Itching; Breast Mass (right outer quadrant); and Foot Burn (bilateral)   HPI [name] Bell is a 35 year old female with a medical history of  traumatic brain injury with loss of consciousness (secondary to suicide attempt), seizures, multiple rib and pelvic surgeries (status post pinning), abdominal laceration with subsequent bowel resection and wound healing by secondary intention, depression who comes in for a follow-up visit.  She is currently undergoing rehabilitation and reports improvement in her motor functions and she has also noticed improvement in her cognition. She ambulates without any assistive device at this time. Denies any recent seizures, EEG was normal and she remains on Keppra and is followed by neurology.  Her depression is still uncontrolled despite being on Zoloft and attending therapy sessions where she informs me and not helpful but she continues to go. This stems from the fact that she has family stressors that worsen her condition. Denies suicidal ideation or intents.  Complains of numbness and tingling in her left foot and occasional shooting pain. Has also noticed a right breast lump which changes in size in the last 1 month and is not painful and not related to periods.  At her last visit she had hematuria and was treated for UTI but informs me that symptoms have resolved.    Past Medical History:  Diagnosis Date  . ADD (attention deficit disorder)    has been on medication  . ADHD (attention deficit hyperactivity disorder)   . Anemia   . Anxiety   . Depression   . H/O suicide attempt    at age 35.   Marland Kitchen. PTSD (post-traumatic stress disorder)   . Seizures (HCC)   . Stroke [facility](HCC)     Past Surgical History:  Procedure Laterality Date  . APPLICATION OF WOUND VAC  02/09/2016   Procedure: APPLICATION OF WOUND VAC;  Surgeon: Jimmye NormanJames  [name], MD;  [facility];  Service: General;;  . APPLICATION OF WOUND VAC N/A 02/16/2016   Procedure: RE-APPLICATION OF WOUND VAC;  Surgeon: [name] Thompson, MD;  [facility] OR;  Service: General;  Laterality: N/A;  . BOWEL RESECTION  02/09/2016   Procedure: SMALL BOWEL RESECTION, MESENTERIC REPAIR;  Surgeon: Jimmye NormanJames [name], MD;  [facility];  Service: General;;  . CHEST TUBE INSERTION Right 02/11/2016   Procedure: CHEST TUBE INSERTION;  Surgeon: Jimmye NormanJames [name], MD;  [facility];  Service: General;  Laterality: Right;  . CHEST TUBE INSERTION Right 02/26/2016   Procedure: CHEST TUBE INSERTION;  Surgeon: Kerin PernaPeter Van Trigt, MD;  [facility];  Service: Thoracic;  Laterality: Right;  . CHOLECYSTECTOMY  02/09/2016   Procedure: CHOLECYSTECTOMY;  Surgeon: Jimmye NormanJames [name], MD;  [facility];  Service: General;;  . ESOPHAGOGASTRODUODENOSCOPY N/A 02/10/2016   Procedure: ESOPHAGOGASTRODUODENOSCOPY (EGD);  Surgeon: Sherrilyn RistHenry L Danis III, MD;  [facility];  Service: Endoscopy;  Laterality: N/A;  . EXTERNAL FIXATION PELVIS  02/09/2016   Procedure: EXTERNAL FIXATION PELVIS;  Surgeon: Myrene GalasMichael [name], MD;  [facility];  Service: Orthopedics;;  . HEMATOMA EVACUATION Right 05/14/2016   Procedure: EVACUATION HEMATOMA;  Surgeon: Alleen BorneBryan K Bartle, MD;  [facility] OR;  Service: Thoracic;  Laterality: Right;  Evacuation of Hematoma Right Chest  . LACERATION REPAIR  02/09/2016   Procedure: REPAIR LIVER LACERATION;  Surgeon: Jimmye NormanJames [name], MD;  [facility];  Service: General;;  . LAPAROTOMY N/A 02/10/2016  Procedure: EXPLORATORY LAPAROTOMY, removal of packs,  cauterization of liver, repacking of liver, and open abdomen vac application;  Surgeon: [name], MD;  Location: [facility] OR;  Service: General;  Laterality: N/A;  . LAPAROTOMY N/A 02/11/2016   Procedure: EXPLORATORY LAPAROTOMY VAC CHANGE ;  Surgeon: Jimmye Norman, MD;  [facility];  Service: General;  Laterality: N/A;  . LAPAROTOMY N/A 02/13/2016   Procedure:  EXPLORATORY LAPAROTOMY, REMOVAL OF PACKS, ABDOMINAL VAC DRESSING CHANGE;  Surgeon: [name], MD;  [facility];  Service: General;  Laterality: N/A;  . LAPAROTOMY N/A 02/18/2016   Procedure: EXPLORATORY LAPAROTOMY, PLACEMENT OF ABRA ABDOMINAL WALL CLOSURE SET;  Surgeon: Jimmye Norman, MD;  [facility] OR;  Service: General;  Laterality: N/A;  . LAPAROTOMY N/A 02/09/2016   Procedure: EXPLORATORY LAPAROTOMY;  Surgeon: Jimmye Norman, MD;  Location: [facility] OR;  Service: General;  Laterality: N/A;  . LAPAROTOMY N/A 02/16/2016   Procedure: EXPLORATORY LAPAROTOMY, ABDOMINAL WASH OUT;  Surgeon: [name], MD;  Location: [facility] OR;  Service: General;  Laterality: N/A;  . PERCUTANEOUS TRACHEOSTOMY N/A 03/01/2016   Procedure: PERCUTANEOUS TRACHEOSTOMY;  Surgeon: Jimmye Norman, MD;  Location[facility] OR;  Service: General;  Laterality: N/A;  . SACRO-ILIAC PINNING Right 02/16/2016   Procedure: Loyal Gambler;  Surgeon: Myrene Galas, MD;  Location: [facility] OR;  Service: Orthopedics;  Laterality: Right;  . TEE WITHOUT CARDIOVERSION N/A 05/14/2016   Procedure: TRANSESOPHAGEAL ECHOCARDIOGRAM (TEE);  Surgeon: Alleen Borne, MD;  Location: [facility] OR;  Service: Thoracic;  Laterality: N/A;  . THORACOTOMY/LOBECTOMY Right 04/26/2016   Procedure: Right THORACOTOMY AND DRAINAGE OF EMPYEMA;  Surgeon: Alleen Borne, MD;  [facility] OR;  Service: Thoracic;  Laterality: Right;  Marland Kitchen VIDEO ASSISTED THORACOSCOPY (VATS)/THOROCOTOMY Right 05/14/2016   Procedure: RIGHT VIDEO ASSISTED THORACOSCOPY (VATS),DRAINAGE OF EMPYEMA;  Surgeon: Alleen Borne, MD;  [facility] OR;  Service: Thoracic;  Laterality: Right;  . WOUND DEBRIDEMENT N/A 03/01/2016   Procedure: ABDOMINAL WOUND CLOSURE;  Surgeon: Jimmye Norman, MD;  Location: [facility] OR;  Service: General;  Laterality: N/A;    No Known Allergies   Outpatient Medications Prior to Visit  Medication Sig Dispense Refill  . aspirin 81 MG EC tablet Take 1 tablet (81 mg total) by mouth daily. 100 tablet 0  .  ferrous gluconate (FERGON) 324 MG tablet Take 1 tablet (324 mg total) by mouth 2 (two) times daily with a meal. 60 tablet 0  . FIBER PO Take 1 tablet by mouth daily as needed (fiber supplement).    Marland Kitchen levETIRAcetam (KEPPRA) 500 MG tablet Take 1 tablet (500 mg total) by mouth 2 (two) times daily. 180 tablet 0  . Multiple Vitamin (MULTIVITAMIN WITH MINERALS) TABS tablet Take 1 tablet by mouth daily. 30 tablet 0  . sertraline (ZOLOFT) 100 MG tablet Take 1 tablet (100 mg total) by mouth daily. 30 tablet 3  . atorvastatin (LIPITOR) 20 MG tablet Take 1 tablet (20 mg total) by mouth daily at 6 PM. 30 tablet 2   No facility-administered medications prior to visit.     ROS Review of Systems  Constitutional: Negative for activity change, appetite change and fatigue.  HENT: Negative for congestion, sinus pressure and sore throat.   Eyes: Negative for visual disturbance.  Respiratory: Negative for cough, chest tightness, shortness of breath and wheezing.   Cardiovascular: Negative for chest pain and palpitations.  Gastrointestinal: Negative for abdominal distention, abdominal pain and constipation.  Endocrine: Negative for polydipsia.  Genitourinary: Negative for dysuria and frequency.  Musculoskeletal:  Negative for arthralgias and back pain.  Skin: Negative for rash.  Neurological: Positive for numbness. Negative for tremors and light-headedness.  Hematological: Does not bruise/bleed easily.  Psychiatric/Behavioral: Positive for dysphoric mood. Negative for agitation and behavioral problems.    Objective:  BP 107/71 (BP Location: Right Arm, Patient Position: Sitting, Cuff Size: Large)   Pulse 67   Temp 97.9 F (36.6 C) (Oral)   Ht 6' (1.829 m)   Wt 223 lb 9.6 oz (101.4 kg)   SpO2 98%   BMI 30.33 kg/m   BP/Weight 09/24/2016 09/14/2016 07/27/2016  Systolic BP 107 102 108  Diastolic BP 71 69 56  Wt. (Lbs) 223.6 - 197.4  BMI 30.33 - 26.77  Some encounter information is confidential and  restricted. Go to Review Flowsheets activity to see all data.      Physical Exam  Constitutional: She is oriented to person, place, and time. She appears well-developed and well-nourished.  Cardiovascular: Normal rate, normal heart sounds and intact distal pulses.   No murmur heard. Pulmonary/Chest: Effort normal and breath sounds normal. She has no wheezes. She has no rales. She exhibits no tenderness. Right breast exhibits mass (nontender mobile breast lump at 11 o'clock). Right breast exhibits no tenderness. Left breast exhibits no mass and no tenderness.  Abdominal: Soft. Bowel sounds are normal. She exhibits no distension and no mass. There is no tenderness.  Vertical abdominal surgical scar  Musculoskeletal: Normal range of motion.  Neurological: She is alert and oriented to person, place, and time.  Psychiatric: She exhibits a depressed mood.     CMP Latest Ref Rng & Units 07/06/2016 06/14/2016 06/10/2016  Glucose 65 - 99 mg/dL 79 161(W119(H) -  BUN 6 - 20 mg/dL 12 14 -  Creatinine 9.600.44 - 1.00 mg/dL 4.540.69 0.980.72 1.190.73  Sodium 135 - 145 mmol/L 137 140 -  Potassium 3.5 - 5.1 mmol/L 4.5 4.1 -  Chloride 101 - 111 mmol/L 103 110 -  CO2 22 - 32 mmol/L 26 21(L) -  Calcium 8.9 - 10.3 mg/dL 14.710.1 9.7 -  Total Protein 6.5 - 8.1 g/dL 7.3 - -  Total Bilirubin 0.3 - 1.2 mg/dL 0.6 - -  Alkaline Phos 38 - 126 U/L 71 - -  AST 15 - 41 U/L 31 - -  ALT 14 - 54 U/L 19 - -    Lipid Panel     Component Value Date/Time   CHOL 180 05/11/2016 0518   TRIG 108 05/11/2016 0518   HDL 46 05/11/2016 0518   CHOLHDL 3.9 05/11/2016 0518   VLDL 22 05/11/2016 0518   LDLCALC 112 (H) 05/11/2016 0518    Assessment & Plan:   1. Pure hypercholesterolemia Stable - atorvastatin (LIPITOR) 20 MG tablet; Take 1 tablet (20 mg total) by mouth daily at 6 PM.  Dispense: 30 tablet; Refill: 3  2. Lump of right breast Suspicious for fibroadenoma - US BREAST LTD UNI RIGHT INC AXILLA; Future - MM Digital Diagnostic Bilat;  Future  3. Seizures (HCC) No recent seizures Continue Keppra Keep upcoming appointment with neurology in 11/2016  4. Major depressive disorder, recurrent severe without psychotic features (HCC) Exacerbated by ongoing family stressors Continue therapy and keep appointment with psychiatrist Continue Zoloft  5. 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 atorvastatin (LIPITOR) 20 MG tablet    Sig: Take 1 tablet (20 mg total) by mouth daily at 6 PM.  Dispense:  30 tablet    Refill:  3  . gabapentin (NEURONTIN) 300 MG capsule    Sig: Take 1 capsule (300 mg total) by mouth 2 (two) times daily.    Dispense:  60 capsule    Refill:  3    Follow-up: Return in about 3 months (around 12/25/2016) for follow up on chronic medical conditions.   Jaclyn Shaggy MD

## 2016-09-27 ENCOUNTER — Ambulatory Visit: Payer: Medicaid Other | Admitting: Physical Therapy

## 2016-09-28 ENCOUNTER — Ambulatory Visit: Payer: Medicaid Other | Admitting: Physical Therapy

## 2016-09-28 DIAGNOSIS — M6281 Muscle weakness (generalized): Secondary | ICD-10-CM

## 2016-09-28 DIAGNOSIS — R2689 Other abnormalities of gait and mobility: Secondary | ICD-10-CM | POA: Diagnosis not present

## 2016-09-29 NOTE — Therapy (Signed)
[facility] [address] [place], [place], [address] Phone: [phone]   Fax:  [phone]  Physical Therapy Treatment  Patient Details  Name: [name] Bell MRN: [id number] Date of Birth: 05/06/1981 Referring Provider: Dr. Claudette Laws  Encounter Date: 09/28/2016      PT End of Session - 09/29/16 0947    Visit Number 14   Number of Visits 24   Date for PT Re-Evaluation 10/07/16   Authorization Type Medicaid   Authorization Time Period 9-8 - 10-07-16   Authorization - Visit Number 13   Authorization - Number of Visits 24   PT Start Time 0801   PT Stop Time 0846   PT Time Calculation (min) 45 min      Past Medical History:  Diagnosis Date  . ADD (attention deficit disorder)    has been on medication  . ADHD (attention deficit hyperactivity disorder)   . Anemia   . Anxiety   . Depression   . H/O suicide attempt    at age 13.   Marland Kitchen PTSD (post-traumatic stress disorder)   . Seizures (HCC)   . Stroke [facility])     Past Surgical History:  Procedure Laterality Date  . APPLICATION OF WOUND VAC  02/09/2016   Procedure: APPLICATION OF WOUND VAC;  Surgeon: Jimmye Norman, MD;  Location: [facility] OR;  Service: General;;  . APPLICATION OF WOUND VAC N/A 02/16/2016   Procedure: RE-APPLICATION OF WOUND VAC;  Surgeon: [name], MD;  Location: [facility] OR;  Service: General;  Laterality: N/A;  . BOWEL RESECTION  02/09/2016   Procedure: SMALL BOWEL RESECTION, MESENTERIC REPAIR;  Surgeon: Jimmye Norman, MD;  Location: [facility] OR;  Service: General;;  . CHEST TUBE INSERTION Right 02/11/2016   Procedure: CHEST TUBE INSERTION;  Surgeon: Jimmye Norman, MD;  Location: [facility] OR;  Service: General;  Laterality: Right;  . CHEST TUBE INSERTION Right 02/26/2016   Procedure: CHEST TUBE INSERTION;  Surgeon: Kerin Perna, MD;  Location: [facility] OR;  Service: Thoracic;  Laterality: Right;  . CHOLECYSTECTOMY  02/09/2016   Procedure: CHOLECYSTECTOMY;  Surgeon: Jimmye Norman, MD;  Location: [facility] OR;  Service: General;;  . ESOPHAGOGASTRODUODENOSCOPY N/A 02/10/2016   Procedure: ESOPHAGOGASTRODUODENOSCOPY (EGD);  Surgeon: Sherrilyn Rist, MD;  Location: [facility] ENDOSCOPY;  Service: Endoscopy;  Laterality: N/A;  . EXTERNAL FIXATION PELVIS  02/09/2016   Procedure: EXTERNAL FIXATION PELVIS;  Surgeon: Myrene Galas, MD;  Location: [facility] OR;  Service: Orthopedics;;  . HEMATOMA EVACUATION Right 05/14/2016   Procedure: EVACUATION HEMATOMA;  Surgeon: Alleen Borne, MD;  Location: [facility] OR;  Service: Thoracic;  Laterality: Right;  Evacuation of Hematoma Right Chest  . LACERATION REPAIR  02/09/2016   Procedure: REPAIR LIVER LACERATION;  Surgeon: Jimmye Norman, MD;  Location: [facility] OR;  Service: General;;  . LAPAROTOMY N/A 02/10/2016   Procedure: EXPLORATORY LAPAROTOMY, removal of packs,  cauterization of liver, repacking of liver, and open abdomen vac application;  Surgeon: [name], MD;  Location: [facility] OR;  Service: General;  Laterality: N/A;  . LAPAROTOMY N/A 02/11/2016   Procedure: EXPLORATORY LAPAROTOMY VAC CHANGE ;  Surgeon: Jimmye Norman, MD;  Location: [facility] OR;  Service: General;  Laterality: N/A;  . LAPAROTOMY N/A 02/13/2016   Procedure: EXPLORATORY LAPAROTOMY, REMOVAL OF PACKS, ABDOMINAL VAC DRESSING CHANGE;  Surgeon: [name], MD;  Location: [facility] OR;  Service: General;  Laterality: N/A;  . LAPAROTOMY N/A 02/18/2016   Procedure: EXPLORATORY LAPAROTOMY, PLACEMENT OF ABRA ABDOMINAL WALL CLOSURE SET;  Surgeon:  Jimmye NormanJames [name], MD;  [facility];  Service: General;  Laterality: N/A;  . LAPAROTOMY N/A 02/09/2016   Procedure: EXPLORATORY LAPAROTOMY;  Surgeon: Jimmye NormanJames [name], MD;  [facility];  Service: General;  Laterality: N/A;  . LAPAROTOMY N/A 02/16/2016   Procedure: EXPLORATORY LAPAROTOMY, ABDOMINAL WASH OUT;  Surgeon: [name] Thompson, MD;  [facility];  Service: General;  Laterality: N/A;  . PERCUTANEOUS TRACHEOSTOMY N/A 03/01/2016   Procedure: PERCUTANEOUS TRACHEOSTOMY;  Surgeon: Jimmye NormanJames  [name], MD;  [facility];  Service: General;  Laterality: N/A;  . SACRO-ILIAC PINNING Right 02/16/2016   Procedure: Loyal GamblerSACRO-ILIAC PINNING;  Surgeon: Myrene GalasMichael [name], MD;  [facility];  Service: Orthopedics;  Laterality: Right;  . TEE WITHOUT CARDIOVERSION N/A 05/14/2016   Procedure: TRANSESOPHAGEAL ECHOCARDIOGRAM (TEE);  Surgeon: Alleen BorneBryan K Bartle, MD;  [facility];  Service: Thoracic;  Laterality: N/A;  . THORACOTOMY/LOBECTOMY Right 04/26/2016   Procedure: Right THORACOTOMY AND DRAINAGE OF EMPYEMA;  Surgeon: Alleen BorneBryan K Bartle, MD;  Location: [facility] OR;  Service: Thoracic;  Laterality: Right;  Marland Kitchen. VIDEO ASSISTED THORACOSCOPY (VATS)/THOROCOTOMY Right 05/14/2016   Procedure: RIGHT VIDEO ASSISTED THORACOSCOPY (VATS),DRAINAGE OF EMPYEMA;  Surgeon: Alleen BorneBryan K Bartle, MD;  Location: [facility] OR;  Service: Thoracic;  Laterality: Right;  . WOUND DEBRIDEMENT N/A 03/01/2016   Procedure: ABDOMINAL WOUND CLOSURE;  Surgeon: Jimmye NormanJames [name], MD;  [facility];  Service: General;  Laterality: N/A;    There were no vitals filed for this visit.      Subjective Assessment - 09/29/16 0945    Subjective Pt reports she started having increased L hip pain after last Thursday's PT session - went to [facility] on Friday and was prescribed Gabapentin   Pertinent History Suicide attempt on 02-09-16 (fall from 5th floor hotel room) with multiple injuries sustained including TBI with LOC, multiple pelvic fractures, L wrist fracture, multiple fractures of ribs R and L sides; liver laceration:  bil. pulmonary contusion:  acute respiratory failure:  cerebral embolism with cerebral infarction and seizure sustained on 05-10-16                                                                   Patient Stated Goals "to get my toes to wiggle and to be able to walk without cane and get up out of chairs easier"                         [facility]/Exercise - 09/29/16 0001      Lumbar Exercises: Machines for Strengthening   Leg Press  60# bil. LE's 15 reps:  LLE only 30# 1set 10 reps     Lumbar Exercises: Standing   Heel Raises 10 reps  bil. LE's with > weight on LLE than on RLE   Functional Squats 10 reps     Knee/Hip Exercises: Aerobic   Recumbent Bike Nustep level 5 x 5" with UE's and LE's     Knee/Hip Exercises: Standing   Forward Step Up Left;1 set;Step Height: 6";10 reps   Functional Squat 1 set;10 reps  on Bosu     Neuro Re-ed; standing on Bosu - moving RLE up/back and laterally for improved L SLS - 10 reps each direction  Stepping over and back of  balance beam with RLE for improved L SLS - 5 reps without UE support             PT Short Term Goals - 08/30/16 2025      PT SHORT TERM GOAL #1   Title Improve TUG score to </= 18 secs with LBQC to demonstrate improved functional mobility.  (07-29-16)   Baseline 13.88 with no device - on 08-17-16   Time 4   Period Weeks   Status Achieved     PT SHORT TERM GOAL #2   Title Improve gait velocity to >/= 2.0 ft/sec with [facility] for incr. gait efficiency.  (07-29-16)   Baseline 15.10 secs with tripod based  = 2.15 ft/sec with cane on 08-18-16   Time 4   Period Weeks   Status Achieved     PT SHORT TERM GOAL #3   Title Amb. 100' with min assist with single point cane on flat, even surface.  (07-29-16)   Baseline Pt able to amb. with cane 120' with SBA  (08-17-16)   Time 4   Period Weeks   Status Achieved     PT SHORT TERM GOAL #4   Title Increase Berg balance test score by at least 5 points to reduce fall risk.  (07-29-16)   Baseline Berg test to be completed next session - Not tested due to time constraint at evaluation   Status On-going     PT SHORT TERM GOAL #5   Title Independent in HEP for LLE strengthening and stretching.  (07-29-16)   Status Achieved           PT Long Term Goals - 06/28/16 1629      PT LONG TERM GOAL #1   Title Improve TUG score to </= 14.5 secs with LBQC to reduce fall risk.  (09-27-16)   Baseline 23.81 secs with LBQC    Time 12   Period Weeks   Status New     PT LONG TERM GOAL #2   Title Improve gait velocity to >/= 2.5 ft/sec with single point cane for incr. gait efficiency.  (09-27-16)   Baseline 1.59 ft/sec with [facility]   Time 12   Period Weeks   Status New     PT LONG TERM GOAL #3   Title Modified independent with household ambulation without device with AFO.  (09-27-16)   Baseline Pt requires use of LBQC for assist. with ambulation - with supervision for safety   Time 12   Period Weeks   Status New     PT LONG TERM GOAL #4   Title Amb. 1000' with single point cane with S on flat even and uneven surfaces for incr. community accessibility.  (09-27-16)   Baseline 200' with SBA on uneven surface with LBQC   Time 12   Period Weeks   Status New     PT LONG TERM GOAL #5   Title Verbalize understanding of options for community exercise programs upon D/C from PT.  (09-27-16)   Baseline Dependent   Time 12   Period Weeks   Status New               Plan - 09/29/16 0947    Clinical Impression Statement Pt's c/o increased L hip soreness and discomfort limiting strengthening exercises - as pt requesting to hold elliptical and hip abduction in sidelying position as pt reports it is uncomfortable to lie on L side due to hip pain   Rehab Potential Good  PT Frequency 2x / week   PT Duration 12 weeks   PT Treatment/Interventions ADLs/Self Care Home Management;DME Instruction;Gait training;Stair training;Functional mobility training;Orthotic Fit/Training;Patient/family education;Neuromuscular re-education;Balance training;Therapeutic exercise;Therapeutic activities   PT Next Visit Plan Core strengthening; L hip abductor strengthening   PT Home Exercise Plan LLE strengthening   Consulted and Agree with Plan of Care Patient      Patient will benefit from skilled therapeutic intervention in order to improve the following deficits and impairments:  Abnormal gait, Decreased balance, Decreased  coordination, Decreased endurance, Pain, Decreased mobility, Decreased strength, Decreased skin integrity, Impaired sensation, Impaired tone  Visit Diagnosis: Muscle weakness (generalized)  Other abnormalities of gait and mobility     Problem List Patient Active Problem List   Diagnosis Date Noted  . Hyperlipidemia 09/24/2016  . Seizures (HCC) 06/18/2016  . Hx of suicide attempt   . 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 Left hemiparesis (HCC)   . S/P thoracotomy 05/14/2016  . Cerebral embolism with cerebral infarction 05/11/2016  . 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 Multiple pelvic fractures (HCC) 03/03/2016  . Left wrist fracture 03/03/2016  . Multiple fractures of ribs of right side 03/03/2016  . Fracture of multiple ribs of left side 03/03/2016  . Liver laceration 03/03/2016  . Small intestine injury 03/03/2016  . Acute blood loss anemia 03/03/2016  . Suicide attempt 03/03/2016  . Bilateral pulmonary contusion 03/03/2016  . Acute respiratory failure (HCC) 03/03/2016  . Hypokalemia 03/03/2016  . Hypernatremia 03/03/2016  . Acute kidney injury (HCC) 03/03/2016  . Ingestion of caustic substance 03/03/2016  . Tylenol overdose 03/03/2016  . Hyperglycemia 03/03/2016  . Hepatic failure (HCC) 03/03/2016  . Pressure ulcer 02/26/2016  . Fall from, out of or through building, not otherwise specified, initial encounter 02/09/2016  . Hypovolemic shock (HCC) 02/09/2016    Uriah Philipson, Donavan Burnet, PT 09/29/2016, 9:50 AM  [facility] [address] [place], [place], [address] Phone: [phone]   Fax:  [phone]  Name: POLLY BARNER MRN: [id number] Date of Birth: 08/10/1981

## 2016-10-04 ENCOUNTER — Telehealth: Payer: Self-pay | Admitting: Family Medicine

## 2016-10-04 ENCOUNTER — Ambulatory Visit: Payer: Medicaid Other | Admitting: Physical Therapy

## 2016-10-04 ENCOUNTER — Other Ambulatory Visit: Payer: Self-pay | Admitting: Family Medicine

## 2016-10-04 NOTE — Telephone Encounter (Signed)
[name] calling in regards to the orders placed for pt  Orders were placed for only an ultrasound, but they are requesting an order for a digital diagnostic bilateral mammogram, code [id number] be placed as well  Requesting a call back when order has been updated  Pt has an appointment scheduled this Wednesday 11/29

## 2016-10-05 ENCOUNTER — Other Ambulatory Visit: Payer: Self-pay | Admitting: Family Medicine

## 2016-10-05 ENCOUNTER — Telehealth: Payer: Self-pay

## 2016-10-05 ENCOUNTER — Ambulatory Visit: Payer: Medicaid Other | Admitting: Physical Therapy

## 2016-10-05 DIAGNOSIS — R2689 Other abnormalities of gait and mobility: Secondary | ICD-10-CM

## 2016-10-05 DIAGNOSIS — N6310 Unspecified lump in the right breast, unspecified quadrant: Secondary | ICD-10-CM

## 2016-10-05 DIAGNOSIS — M6281 Muscle weakness (generalized): Secondary | ICD-10-CM

## 2016-10-05 NOTE — Telephone Encounter (Signed)
Spoke with [name] Bell this morning.  Dr. Venetia NightAmao to correct order for mammogram.

## 2016-10-05 NOTE — Telephone Encounter (Signed)
Done

## 2016-10-06 ENCOUNTER — Ambulatory Visit
Admission: RE | Admit: 2016-10-06 | Discharge: 2016-10-06 | Disposition: A | Discharge status: Home / Self Care | Payer: Medicaid Other | Source: Ambulatory Visit | Attending: Family Medicine | Admitting: Family Medicine

## 2016-10-06 ENCOUNTER — Other Ambulatory Visit: Payer: Self-pay

## 2016-10-06 DIAGNOSIS — N6310 Unspecified lump in the right breast, unspecified quadrant: Secondary | ICD-10-CM

## 2016-10-06 NOTE — Therapy (Signed)
[facility] [address] [place], [place], [address] Phone: [phone]   Fax:  [phone]  Physical Therapy Treatment  Patient Details  Name: KAITY PITSTICK MRN: [id number] Date of Birth: [birth year] Referring Provider: Dr. Alysia Penna  Encounter Date: 10/05/2016      PT End of Session - 10/06/16 2033    Visit Number 15   Number of Visits 24   Date for PT Re-Evaluation 10/07/16   Authorization Type Medicaid   Authorization Time Period 9-8 - 10-07-16   Authorization - Visit Number 14   Authorization - Number of Visits 24   PT Start Time 0802   PT Stop Time 0848   PT Time Calculation (min) 46 min      Past Medical History:  Diagnosis Date  . ADD (attention deficit disorder)    has been on medication  . ADHD (attention deficit hyperactivity disorder)   . Anemia   . Anxiety   . Depression   . H/O suicide attempt    at age 66.   Marland Kitchen PTSD (post-traumatic stress disorder)   . Seizures ([place])   . Stroke [facility])     Past Surgical History:  Procedure Laterality Date  . APPLICATION OF WOUND VAC  02/09/2016   Procedure: APPLICATION OF WOUND VAC;  Surgeon: Judeth Horn, MD;  Location: [employer];  Service: General;;  . APPLICATION OF WOUND VAC N/A 02/16/2016   Procedure: RE-APPLICATION OF WOUND VAC;  Surgeon: Georganna Skeans, MD;  Location: [place];  Service: General;  Laterality: N/A;  . BOWEL RESECTION  02/09/2016   Procedure: SMALL BOWEL RESECTION, MESENTERIC REPAIR;  Surgeon: Judeth Horn, MD;  Location: [place];  Service: General;;  . CHEST TUBE INSERTION Right 02/11/2016   Procedure: CHEST TUBE INSERTION;  Surgeon: Judeth Horn, MD;  Location: [place];  Service: General;  Laterality: Right;  . CHEST TUBE INSERTION Right 02/26/2016   Procedure: CHEST TUBE INSERTION;  Surgeon: Ivin Poot, MD;  Location: [place];  Service: Thoracic;  Laterality: Right;  . CHOLECYSTECTOMY  02/09/2016   Procedure: CHOLECYSTECTOMY;  Surgeon: Judeth Horn, MD;  Location: [place];  Service: General;;  . ESOPHAGOGASTRODUODENOSCOPY N/A 02/10/2016   Procedure: ESOPHAGOGASTRODUODENOSCOPY (EGD);  Surgeon: Doran Stabler, MD;  [facility] END[facility]COPY;  Service: Endoscopy;  Laterality: N/A;  . EXTERNAL FIXATION PELVIS  02/09/2016   Procedure: EXTERNAL FIXATION PELVIS;  Surgeon: Altamese [place], MD;  Location: [place];  Service: Orthopedics;;  . HEMATOMA EVACUATION Right 05/14/2016   Procedure: EVACUATION HEMATOMA;  Surgeon: Gaye Pollack, MD;  Location: [place];  Service: Thoracic;  Laterality: Right;  Evacuation of Hematoma Right Chest  . LACERATION REPAIR  02/09/2016   Procedure: REPAIR LIVER LACERATION;  Surgeon: Judeth Horn, MD;  Location: [place];  Service: General;;  . LAPAROTOMY N/A 02/10/2016   Procedure: EXPLORATORY LAPAROTOMY, removal of packs,  cauterization of liver, repacking of liver, and open abdomen vac application;  Surgeon: Georganna Skeans, MD;  Location: [place];  Service: General;  Laterality: N/A;  . LAPAROTOMY N/A 02/11/2016   Procedure: EXPLORATORY LAPAROTOMY VAC CHANGE ;  Surgeon: Judeth Horn, MD;  Location: [place];  Service: General;  Laterality: N/A;  . LAPAROTOMY N/A 02/13/2016   Procedure: EXPLORATORY LAPAROTOMY, REMOVAL OF PACKS, ABDOMINAL VAC DRESSING CHANGE;  Surgeon: Georganna Skeans, MD;  Location: [place];  Service: General;  Laterality: N/A;  . LAPAROTOMY N/A 02/18/2016   Procedure: EXPLORATORY LAPAROTOMY, PLACEMENT OF ABRA ABDOMINAL WALL CLOSURE SET;  Surgeon:  Judeth Horn, MD;  Location: [place];  Service: General;  Laterality: N/A;  . LAPAROTOMY N/A 02/09/2016   Procedure: EXPLORATORY LAPAROTOMY;  Surgeon: Judeth Horn, MD;  Location: [place];  Service: General;  Laterality: N/A;  . LAPAROTOMY N/A 02/16/2016   Procedure: EXPLORATORY LAPAROTOMY, ABDOMINAL [place];  Surgeon: Georganna Skeans, MD;  Location: [place];  Service: General;  Laterality: N/A;  . PERCUTANEOUS TRACHEOSTOMY N/A 03/01/2016   Procedure: PERCUTANEOUS TRACHEOSTOMY;  Surgeon: Judeth Horn, MD;  Location: [place];  Service: General;  Laterality: N/A;  . SACRO-ILIAC PINNING Right 02/16/2016   Procedure: [place];  Surgeon: Altamese [place], MD;  Location: [place];  Service: Orthopedics;  Laterality: Right;  . TEE WITHOUT CARDIOVERSION N/A 05/14/2016   Procedure: TRANSESOPHAGEAL ECHOCARDIOGRAM (TEE);  Surgeon: Gaye Pollack, MD;  Location: [facility] OR;  Service: Thoracic;  Laterality: N/A;  . THORACOTOMY/LOBECTOMY Right 04/26/2016   Procedure: Right THORACOTOMY AND DRAINAGE OF EMPYEMA;  Surgeon: Gaye Pollack, MD;  Location: [place];  Service: Thoracic;  Laterality: Right;  Marland Kitchen VIDEO ASSISTED THORACOSCOPY (VATS)/THOROCOTOMY Right 05/14/2016   Procedure: RIGHT VIDEO ASSISTED THORACOSCOPY (VATS),DRAINAGE OF EMPYEMA;  Surgeon: Gaye Pollack, MD;  Location: [place];  Service: Thoracic;  Laterality: Right;  . WOUND DEBRIDEMENT N/A 03/01/2016   Procedure: ABDOMINAL WOUND CLOSURE;  Surgeon: Judeth Horn, MD;  Location: [place];  Service: General;  Laterality: N/A;    There were no vitals filed for this visit.      Subjective Assessment - 10/06/16 2027    Subjective Pt reports she had a severe migraine (with nausea/vomiting) that started Sunday - still felt bad on Monday which is why she had to cancel PT appt   Pertinent History Suicide attempt on 02-09-16 (fall from 5th floor hotel room) with multiple injuries sustained including TBI with LOC, multiple pelvic fractures, L wrist fracture, multiple fractures of ribs R and L sides; liver laceration:  bil. pulmonary contusion:  acute respiratory failure:  cerebral embolism with cerebral infarction and seizure sustained on 05-10-16                                                                   How long can you sit comfortably? 30" - then start moving to try to get more comfortable   Patient Stated Goals "to get my toes to wiggle and to be able to walk without cane and get up out of chairs easier"   Currently in Pain? No/denies                          [facility] Adult PT Treatment/Exercise - 10/06/16 0001      Ambulation/Gait   Ambulation/Gait Yes   Ambulation/Gait Assistance 6: Modified independent (Device/Increase time)   Ambulation Distance (Feet) 200 Feet   Assistive device None   Gait Pattern Decreased dorsiflexion - left   Ambulation Surface Level;Indoor   Gait velocity 2.76   11.9 secs without device     Standardized Balance Assessment   Standardized Balance Assessment Timed Up and Go Test     Timed Up and Go Test   Normal TUG (seconds) 11.63     Lumbar Exercises: Stretches   Active Hamstring Stretch 1 rep;30 seconds  LLE  Lumbar Exercises: Aerobic   Elliptical 2" forward   stopped after 2" due to fatigue   Tread Mill 5" forward at 1.5 mph     Lumbar Exercises: Machines for Strengthening   Leg Press 60# bil. LE's 15 reps:  LLE only 30# 1set 10 reps     Lumbar Exercises: Standing   Heel Raises 10 reps  bil. LE's with > weight on LLE than on RLE   Functional Squats 10 reps     Knee/Hip Exercises: Stretches   Gastroc Stretch Left;1 rep;30 seconds                PT Education - 10/06/16 2040    Education provided Yes   Education Details gave pt info on [employer]) Educated Patient   Methods Explanation;Handout   Comprehension Verbalized understanding          PT Short Term Goals - 08/30/16 2025      PT SHORT TERM GOAL #1   Title Improve TUG score to </= 18 secs with [facility] to demonstrate improved functional mobility.  (07-29-16)   Baseline 13.88 with no device - on 08-17-16   Time 4   Period Weeks   Status Achieved     PT SHORT TERM GOAL #2   Title Improve gait velocity to >/= 2.0 ft/sec with [facility] for incr. gait efficiency.  (07-29-16)   Baseline 15.10 secs with tripod based  = 2.15 ft/sec with cane on 08-18-16   Time 4   Period Weeks   Status Achieved     PT SHORT TERM GOAL #3   Title Amb. 100' with min assist with single point cane on  flat, even surface.  (07-29-16)   Baseline Pt able to amb. with cane 120' with SBA  (08-17-16)   Time 4   Period Weeks   Status Achieved     PT SHORT TERM GOAL #4   Title Increase Berg balance test score by at least 5 points to reduce fall risk.  (07-29-16)   Baseline Berg test to be completed next session - Not tested due to time constraint at evaluation   Status On-going     PT SHORT TERM GOAL #5   Title Independent in HEP for LLE strengthening and stretching.  (07-29-16)   Status Achieved           PT Long Term Goals - 10/06/16 2034      PT LONG TERM GOAL #1   Title Improve TUG score to </= 14.5 secs with [facility] to reduce fall risk.  (09-27-16)   Baseline 11.63 with no device on 10-05-16   Status Achieved     PT LONG TERM GOAL #2   Title Improve gait velocity to >/= 2.5 ft/sec with single point cane for incr. gait efficiency.  (09-27-16)   Baseline 11.90; 12.03 = 2.76 ft/sec on 10-05-16   Period Weeks   Status Achieved     PT LONG TERM GOAL #3   Title Modified independent with household ambulation without device with AFO.  (09-27-16)   Baseline met on 10-05-16   Status Achieved     PT LONG TERM GOAL #4   Title Amb. 1000' with single point cane with S on flat even and uneven surfaces for incr. community accessibility.  (09-27-16)   Status Achieved     PT LONG TERM GOAL #5   Title Verbalize understanding of options for community exercise programs upon D/C from PT.  (09-27-16)  Baseline gave information on [place] - 10/06/16 2041    Clinical Impression Statement Pt has met all LTG's; continues to have L foot drop - pt states she plans on ordering Foot Up brace as her AFO has been misplaced.  Pt is discharged due to end date of Medicaid authorized visits - pt states she plans on joining gym, i.e. Neurosurgeon or [place].   Rehab Potential Good   PT Frequency 2x / week   PT Duration 12 weeks   PT  Treatment/Interventions ADLs/Self Care Home Management;DME Instruction;Gait training;Stair training;Functional mobility training;Orthotic Fit/Training;Patient/family education;Neuromuscular re-education;Balance training;Therapeutic exercise;Therapeutic activities   PT Next Visit Plan N/A - D/C on 10-05-16   PT Home Exercise Plan LLE strengthening   Consulted and Agree with Plan of Care Patient      Patient will benefit from skilled therapeutic intervention in order to improve the following deficits and impairments:  Abnormal gait, Decreased balance, Decreased coordination, Decreased endurance, Pain, Decreased mobility, Decreased strength, Decreased skin integrity, Impaired sensation, Impaired tone  Visit Diagnosis: Muscle weakness (generalized)  Other abnormalities of gait and mobility     Problem List Patient Active Problem List   Diagnosis Date Noted  . Hyperlipidemia 09/24/2016  . Seizures ([place]) 06/18/2016  . Hx of suicide attempt   . Traumatic brain injury with loss of consciousness of 1 hour to 5 hours 59 minutes ([place]) 06/03/2016  . Left hemiparesis ([place])   . S/P thoracotomy 05/14/2016  . Cerebral embolism with cerebral infarction 05/11/2016  . Major depressive disorder, recurrent severe without psychotic features ([place]) 04/12/2016  . Multiple pelvic fractures ([place]) 03/03/2016  . Left wrist fracture 03/03/2016  . Multiple fractures of ribs of right side 03/03/2016  . Fracture of multiple ribs of left side 03/03/2016  . Liver laceration 03/03/2016  . Small intestine injury 03/03/2016  . Acute blood loss anemia 03/03/2016  . Suicide attempt 03/03/2016  . Bilateral pulmonary contusion 03/03/2016  . Acute respiratory failure ([place]) 03/03/2016  . Hypokalemia 03/03/2016  . Hypernatremia 03/03/2016  . Acute kidney injury ([place]) 03/03/2016  . Ingestion of caustic substance 03/03/2016  . Tylenol overdose 03/03/2016  . Hyperglycemia 03/03/2016  . Hepatic failure ([place]) 03/03/2016   . Pressure ulcer 02/26/2016  . Fall from, out of or through building, not otherwise specified, initial encounter 02/09/2016  . Hypovolemic shock ([place]) 02/09/2016    PHYSICAL THERAPY DISCHARGE SUMMARY  Visits from Start of Care: 15  Current functional level related to goals / functional outcomes: See above for progress towards goals -- all LTG's met   Remaining deficits: Cont. LLE weakness with dorsiflexor/plantarflexor weakness resulting in gait deviations   Education / Equipment: Pt has been instructed in strengthening exercises for LLE: pt states she plans to join [employer] for cont. Exercise program; pt was given info on [facility] as well for another possible option for community exercise program Plan: Patient agrees to discharge.  Patient goals were met. Patient is being discharged due to meeting the stated rehab goals.  ?????       Alda Lea, PT 10/06/2016, 8:47 PM  [facility] [address] [place], [place], [address] Phone: [phone]   Fax:  [phone]  Name: JERICHO CIESLIK MRN: [id number] Date of Birth: [birth year]

## 2016-10-07 ENCOUNTER — Ambulatory Visit (HOSPITAL_COMMUNITY): Payer: Self-pay | Admitting: Clinical

## 2016-10-07 ENCOUNTER — Ambulatory Visit: Payer: Self-pay | Admitting: Physical Therapy

## 2016-10-18 ENCOUNTER — Ambulatory Visit (HOSPITAL_COMMUNITY): Payer: Self-pay | Admitting: Clinical

## 2016-10-21 ENCOUNTER — Ambulatory Visit (HOSPITAL_COMMUNITY): Payer: Self-pay | Admitting: Clinical

## 2016-11-05 ENCOUNTER — Other Ambulatory Visit: Payer: Self-pay | Admitting: Neurology

## 2016-11-05 DIAGNOSIS — R569 Unspecified convulsions: Secondary | ICD-10-CM

## 2016-11-15 ENCOUNTER — Ambulatory Visit: Payer: Medicaid Other | Admitting: Neurology

## 2016-11-25 ENCOUNTER — Ambulatory Visit (HOSPITAL_COMMUNITY): Payer: Self-pay | Admitting: Psychiatry

## 2017-01-03 ENCOUNTER — Other Ambulatory Visit: Payer: Self-pay | Admitting: Family Medicine

## 2017-01-10 ENCOUNTER — Ambulatory Visit (HOSPITAL_COMMUNITY): Payer: Self-pay | Admitting: Clinical

## 2017-01-12 ENCOUNTER — Encounter: Payer: Self-pay | Admitting: Physical Medicine & Rehabilitation

## 2017-01-12 ENCOUNTER — Encounter: Payer: Medicaid Other | Attending: Physical Medicine & Rehabilitation | Admitting: Physical Medicine & Rehabilitation

## 2017-01-12 VITALS — BP 109/76 | HR 73 | Resp 14

## 2017-01-12 DIAGNOSIS — S3282XS Multiple fractures of pelvis without disruption of pelvic ring, sequela: Secondary | ICD-10-CM | POA: Diagnosis not present

## 2017-01-12 DIAGNOSIS — Z823 Family history of stroke: Secondary | ICD-10-CM | POA: Diagnosis not present

## 2017-01-12 DIAGNOSIS — Z9049 Acquired absence of other specified parts of digestive tract: Secondary | ICD-10-CM | POA: Diagnosis not present

## 2017-01-12 DIAGNOSIS — I69854 Hemiplegia and hemiparesis following other cerebrovascular disease affecting left non-dominant side: Secondary | ICD-10-CM | POA: Diagnosis not present

## 2017-01-12 DIAGNOSIS — F329 Major depressive disorder, single episode, unspecified: Secondary | ICD-10-CM | POA: Insufficient documentation

## 2017-01-12 DIAGNOSIS — Z981 Arthrodesis status: Secondary | ICD-10-CM | POA: Diagnosis present

## 2017-01-12 DIAGNOSIS — F4310 Post-traumatic stress disorder, unspecified: Secondary | ICD-10-CM | POA: Diagnosis not present

## 2017-01-12 DIAGNOSIS — S069X3S Unspecified intracranial injury with loss of consciousness of 1 hour to 5 hours 59 minutes, sequela: Secondary | ICD-10-CM | POA: Diagnosis not present

## 2017-01-12 DIAGNOSIS — R42 Dizziness and giddiness: Secondary | ICD-10-CM | POA: Insufficient documentation

## 2017-01-12 DIAGNOSIS — R569 Unspecified convulsions: Secondary | ICD-10-CM | POA: Diagnosis not present

## 2017-01-12 DIAGNOSIS — F909 Attention-deficit hyperactivity disorder, unspecified type: Secondary | ICD-10-CM | POA: Insufficient documentation

## 2017-01-12 DIAGNOSIS — R200 Anesthesia of skin: Secondary | ICD-10-CM | POA: Diagnosis not present

## 2017-01-12 DIAGNOSIS — Z9889 Other specified postprocedural states: Secondary | ICD-10-CM | POA: Diagnosis not present

## 2017-01-12 DIAGNOSIS — M25551 Pain in right hip: Secondary | ICD-10-CM | POA: Diagnosis not present

## 2017-01-12 DIAGNOSIS — H538 Other visual disturbances: Secondary | ICD-10-CM | POA: Diagnosis not present

## 2017-01-12 DIAGNOSIS — Z8249 Family history of ischemic heart disease and other diseases of the circulatory system: Secondary | ICD-10-CM | POA: Insufficient documentation

## 2017-01-12 DIAGNOSIS — Z818 Family history of other mental and behavioral disorders: Secondary | ICD-10-CM | POA: Diagnosis not present

## 2017-01-12 DIAGNOSIS — R531 Weakness: Secondary | ICD-10-CM | POA: Insufficient documentation

## 2017-01-12 MED ORDER — DICLOFENAC SODIUM 75 MG PO TBEC: 75.0000 mg | DELAYED_RELEASE_TABLET | Freq: Two times a day (BID) | ORAL | 3 refills | Status: AC

## 2017-01-12 NOTE — Patient Instructions (Signed)
PLEASE FEEL FREE TO CALL OUR OFFICE WITH ANY PROBLEMS OR QUESTIONS ([phone])      

## 2017-01-12 NOTE — Progress Notes (Signed)
Subjective:    Patient ID: [name] Bell, female    DOB: May 25, 1981, 36 y.o.   MRN: [id number]  HPI   [name] Bell is here in follow up of her TBI/CVA. She has had increased pain in her hips and pelvis which has affected her walking and sleep. She is unable to find a comfortable position at night. Her primary put her on gabapentin which hasn't made  a difference.   She has had increasing problems with depression and is following up with her psychologist and counselor. She has had some problems keeping up with her appointments due to her memory deficits. She thinks her zoloft needs to be increased.  [name] Bell also reports problems with dizziness and blurred vision. Symptoms are usually worse when she first stands up.   Pain Inventory Average Pain 6 Pain Right Now 8 My pain is constant and aching  In the last 24 hours, has pain interfered with the following? General activity not answered Relation with others not answered Enjoyment of life not answered What TIME of day is your pain at its worst? evening and night Sleep (in general) Poor  Pain is worse with: bending, sitting and standing Pain improves with: rest and medication Relief from Meds: 5  Mobility use a cane how many minutes can you walk? unsure ability to climb steps?  yes do you drive?  no  Function not employed: date last employed . I need assistance with the following:  dressing, bathing, toileting, meal prep, household duties and shopping  Neuro/Psych weakness numbness tingling trouble walking spasms dizziness confusion depression anxiety suicidal thoughts does not have active plan and is under care of psychiatrist  Prior Studies Any changes since last visit?  no  Physicians involved in your care Any changes since last visit?  no   Family History  Problem Relation Age of Onset  . Hypertension Father   . Mental illness Mother   . Stroke Maternal Aunt   . Hypertension Sister   . Fibroids Sister     Social History   Social History  . Marital status: Single    Spouse name: N/A  . Number of children: N/A  . Years of education: N/A   Social History Main Topics  . Smoking status: Never Smoker  . Smokeless tobacco: Never Used  . Alcohol use No     Comment: unknown  . Drug use: No  . Sexual activity: No   Other Topics Concern  . None   Social History Narrative  . None   Past Surgical History:  Procedure Laterality Date  . APPLICATION OF WOUND VAC  02/09/2016   Procedure: APPLICATION OF WOUND VAC;  Surgeon: Jimmye Norman, MD;  Location: [facility] OR;  Service: General;;  . APPLICATION OF WOUND VAC N/A 02/16/2016   Procedure: RE-APPLICATION OF WOUND VAC;  Surgeon: [name], MD;  [facility] OR;  Service: General;  Laterality: N/A;  . BOWEL RESECTION  02/09/2016   Procedure: SMALL BOWEL RESECTION, MESENTERIC REPAIR;  Surgeon: Jimmye Norman, MD;  Location: [facility] OR;  Service: General;;  . CHEST TUBE INSERTION Right 02/11/2016   Procedure: CHEST TUBE INSERTION;  Surgeon: Jimmye Norman, MD;  [facility] OR;  Service: General;  Laterality: Right;  . CHEST TUBE INSERTION Right 02/26/2016   Procedure: CHEST TUBE INSERTION;  Surgeon: Kerin Perna, MD;  Location: [facility] OR;  Service: Thoracic;  Laterality: Right;  . CHOLECYSTECTOMY  02/09/2016   Procedure: CHOLECYSTECTOMY;  Surgeon: Jimmye Norman, MD;  [facility] OR;  Service: General;;  . ESOPHAGOGASTRODUODENOSCOPY N/A 02/10/2016   Procedure: ESOPHAGOGASTRODUODENOSCOPY (EGD);  Surgeon: Sherrilyn Rist, MD;  Location: [facility] ENDOSCOPY;  Service: Endoscopy;  Laterality: N/A;  . EXTERNAL FIXATION PELVIS  02/09/2016   Procedure: EXTERNAL FIXATION PELVIS;  Surgeon: Myrene Galas, MD;  Location: [facility] OR;  Service: Orthopedics;;  . HEMATOMA EVACUATION Right 05/14/2016   Procedure: EVACUATION HEMATOMA;  Surgeon: Alleen Borne, MD;  [facility];  Service: Thoracic;  Laterality: Right;  Evacuation of Hematoma Right Chest  . LACERATION REPAIR  02/09/2016   Procedure:  REPAIR LIVER LACERATION;  Surgeon: Jimmye Norman, MD;  Location: [facility] OR;  Service: General;;  . LAPAROTOMY N/A 02/10/2016   Procedure: EXPLORATORY LAPAROTOMY, removal of packs,  cauterization of liver, repacking of liver, and open abdomen vac application;  Surgeon: [name], MD;  Location: [facility] OR;  Service: General;  Laterality: N/A;  . LAPAROTOMY N/A 02/11/2016   Procedure: EXPLORATORY LAPAROTOMY VAC CHANGE ;  Surgeon: Jimmye Norman, MD;  [facility];  Service: General;  Laterality: N/A;  . LAPAROTOMY N/A 02/13/2016   Procedure: EXPLORATORY LAPAROTOMY, REMOVAL OF PACKS, ABDOMINAL VAC DRESSING CHANGE;  Surgeon: [name], MD;  [facility];  Service: General;  Laterality: N/A;  . LAPAROTOMY N/A 02/18/2016   Procedure: EXPLORATORY LAPAROTOMY, PLACEMENT OF ABRA ABDOMINAL WALL CLOSURE SET;  Surgeon: Jimmye Norman, MD;  [facility];  Service: General;  Laterality: N/A;  . LAPAROTOMY N/A 02/09/2016   Procedure: EXPLORATORY LAPAROTOMY;  Surgeon: Jimmye Norman, MD;  Location: [facility] OR;  Service: General;  Laterality: N/A;  . LAPAROTOMY N/A 02/16/2016   Procedure: EXPLORATORY LAPAROTOMY, ABDOMINAL [place];  Surgeon: [name], MD;  Location: [facility] OR;  Service: General;  Laterality: N/A;  . PERCUTANEOUS TRACHEOSTOMY N/A 03/01/2016   Procedure: PERCUTANEOUS TRACHEOSTOMY;  Surgeon: Jimmye Norman, MD;  Location: [facility] OR;  Service: General;  Laterality: N/A;  . SACRO-ILIAC PINNING Right 02/16/2016   Procedure: [place] Gamb[place];  Surgeon: Myrene Galas, MD;  Location: [facility] OR;  Service: Orthopedics;  Laterality: Right;  . TEE WITHOUT CARDIOVERSION N/A 05/14/2016   Procedure: TRANSESOPHAGEAL ECHOCARDIOGRAM (TEE);  Surgeon: Alleen Borne, MD;  Location: [facility] OR;  Service: Thoracic;  Laterality: N/A;  . THORACOTOMY/LOBECTOMY Right 04/26/2016   Procedure: Right THORACOTOMY AND DRAINAGE OF EMPYEMA;  Surgeon: Alleen Borne, MD;  [facility];  Service: Thoracic;  Laterality: Right;  Marland Kitchen VIDEO ASSISTED THORACOSCOPY  (VATS)/THOROCOTOMY Right 05/14/2016   Procedure: RIGHT VIDEO ASSISTED THORACOSCOPY (VATS),DRAINAGE OF EMPYEMA;  Surgeon: Alleen Borne, MD;  [facility];  Service: Thoracic;  Laterality: Right;  . WOUND DEBRIDEMENT N/A 03/01/2016   Procedure: ABDOMINAL WOUND CLOSURE;  Surgeon: Jimmye Norman, MD;  Location: [facility] OR;  Service: General;  Laterality: N/A;   Past Medical History:  Diagnosis Date  . ADD (attention deficit disorder)    has been on medication  . ADHD (attention deficit hyperactivity disorder)   . Anemia   . Anxiety   . Depression   . H/O suicide attempt    at age 32.   Marland Kitchen PTSD (post-traumatic stress disorder)   . Seizures ([facility])   . Stroke ([facility])    BP 109/76   Pulse 73   Resp 14   SpO2 98%   Opioid Risk Score:   Fall Risk Score:  `1  Depression screen PHQ 2/9  Depression screen [facility] 2/9 01/12/2017 09/24/2016 09/14/2016 07/14/2016 07/09/2016  Decreased Interest 3 0 0 0 0  Down, Depressed, Hopeless 3 2 0 0 0  PHQ - 2 Score 6 2 0 0 0  Altered sleeping - 3 - 0 -  Tired, decreased energy - 0 - 1 -  Change in appetite - 0 - 0 -  Feeling bad or failure about yourself  - 0 - 0 -  Trouble concentrating - 0 - 2 -  Moving slowly or fidgety/restless - 0 - 0 -  Suicidal thoughts - 0 - 0 -  PHQ-9 Score - 5 - 3 -  Difficult doing work/chores - - - Not difficult at all -    Review of Systems  HENT: Negative.   Eyes: Negative.   Respiratory: Positive for cough.   Cardiovascular: Negative.   Gastrointestinal: Positive for abdominal pain.  Endocrine: Negative.   Genitourinary: Negative.   Musculoskeletal: Positive for gait problem.       Spasms  Skin: Negative.   Allergic/Immunologic: Negative.   Neurological: Positive for dizziness, weakness and numbness.       Tingling  Hematological: Negative.   Psychiatric/Behavioral: Positive for confusion, dysphoric mood and suicidal ideas. The patient is nervous/anxious.        No plan and under care of psychiatrist  All other systems  reviewed and are negative.      Objective:   Physical Exam  Constitutional: She appears well-developedand well-nourished.  HENT: Normocephalicand atraumatic.  Eyes: Conjunctivaeand EOM are normal.  Cardiovascular: RRR.  Respiratory: CTA B   GI: Soft. Bowel sounds are normal. She exhibits no distension. There is no tenderness.    Musculoskeletal: tenderness in bilateral hips, FABER equivcoal to postiive. SIJ areas tender bilaterally PSIS tenderness too Neurological: She is alertand oriented.  Speech clear. Able to follow commands without difficulty.  Sensation intact in the left lower extremity Deep tendon reflexes 3+ left patellar, 2+ right patella Motor strength: 4+/5 in the right deltoid, biceps, triceps, grip, RLE: 5/5 hip flexor, knee extensor, ankle dorsiflexor. LUE: 4/5 left deltoid, biceps, triceps, grip LLE; 4/5, hip flexion, knee extension, 3+/5 at the ankle dorsiflexor and 3-4/5plantar flexor. Has trouble advancing the left leg. Antalgic bilaterally  Skin: Skin is warmand dry. No rashnoted. No erythema.  Psychiatric:   Thought contentnormal and at baseline.  Pleasant, more flat today    Assessment & Plan:  1. Left hemiparesis and cognitive deficits secondary to right MCA infarct andtraumatic brain injury, also left sacroiliac fracture status post fusion as well as bilateral superior and inferior pubic ramus fracture with Left HF weakness, weakness may be related to MCA infarct, given she has hyperreflexia in the left lower extremity                       -having some continued pain in her pelvis and hips, worse with weight-bearing and walking   -affecting sleep at night   -trial of diclofenac 75mg  bid   -xrays of pelvis to assess hips 2.. Mood: Patient has denied any anxiety, depressive symptoms, insomnia, nightmares or intent for self harm.  -outpt psychology/psychiatric follow up at [facility]   -I encouraged her to  contact me if needed as well for support   -she understands the importance of treating her depression.    -discussed using memory strategies and keeping a calendar/schedule 3. TBI/R-MCA infarct with DAI with hemorrhage: ASA added -cognition nearly at baseline  -see above 4.  New onset seizures: now just on keppra.   Follow up in 2 months. Thirty minutes of face to face patient care time were spent during this  visit. All questions were encouraged and answered.

## 2017-01-14 ENCOUNTER — Ambulatory Visit
Admission: RE | Admit: 2017-01-14 | Discharge: 2017-01-14 | Disposition: A | Discharge status: Home / Self Care | Payer: Medicaid Other | Source: Ambulatory Visit | Attending: Physical Medicine & Rehabilitation | Admitting: Physical Medicine & Rehabilitation

## 2017-01-14 ENCOUNTER — Telehealth: Payer: Self-pay | Admitting: Physical Medicine & Rehabilitation

## 2017-01-14 DIAGNOSIS — S069X3S Unspecified intracranial injury with loss of consciousness of 1 hour to 5 hours 59 minutes, sequela: Secondary | ICD-10-CM

## 2017-01-14 DIAGNOSIS — S3282XS Multiple fractures of pelvis without disruption of pelvic ring, sequela: Secondary | ICD-10-CM

## 2017-01-14 NOTE — Telephone Encounter (Signed)
Hip/pelvic xrays show screws which are in place. Hip joints look ok. No change in plan at present. Please inform Meade MawKatia, thanks

## 2017-01-17 NOTE — Telephone Encounter (Signed)
[place] notified.  She says her case worker needs the note form last visit.  Forwarded to [employer].

## 2017-01-25 ENCOUNTER — Encounter (HOSPITAL_COMMUNITY): Payer: Self-pay | Admitting: Clinical

## 2017-01-25 ENCOUNTER — Telehealth (HOSPITAL_COMMUNITY): Payer: Self-pay

## 2017-01-25 ENCOUNTER — Ambulatory Visit (INDEPENDENT_AMBULATORY_CARE_PROVIDER_SITE_OTHER): Payer: Medicaid Other | Admitting: Clinical

## 2017-01-25 DIAGNOSIS — F4310 Post-traumatic stress disorder, unspecified: Secondary | ICD-10-CM | POA: Diagnosis not present

## 2017-01-25 DIAGNOSIS — F203 Undifferentiated schizophrenia: Secondary | ICD-10-CM

## 2017-01-25 NOTE — Telephone Encounter (Signed)
This patient is coming to see you on 4/12 - she is a former patient of Dr. Vanetta ShawlHisada (see note from 08/16/2016. She is currently taking Zoloft. Patient continues to have depression, crying spells and feelings of isolation. [facility] that she hears voices, they are mean and she hears them more in the morning than at night, she also has been seeing "shadow people". Patient said she never admitted to the voices before because she was scared she would be locked up. I assured patient that we would not lock her up, but that I would let you know, because there are medications that can help. I attempted to give her a sooner appt. But your schedule is packed.
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
## 2017-01-26 NOTE — Telephone Encounter (Signed)
error 

## 2017-01-27 NOTE — Telephone Encounter (Signed)
Spoke with pt who states she would like to come in earlier if possible. Please put her on the cancellation list if an earlier appointment opens up.

## 2017-02-01 DIAGNOSIS — Z0271 Encounter for disability determination: Secondary | ICD-10-CM

## 2017-02-17 ENCOUNTER — Ambulatory Visit (INDEPENDENT_AMBULATORY_CARE_PROVIDER_SITE_OTHER): Payer: Medicaid Other | Admitting: Psychiatry

## 2017-02-17 ENCOUNTER — Encounter (HOSPITAL_COMMUNITY): Payer: Self-pay | Admitting: Psychiatry

## 2017-02-17 VITALS — BP 108/76 | HR 68 | Ht 72.0 in | Wt 250.0 lb

## 2017-02-17 DIAGNOSIS — Z79899 Other long term (current) drug therapy: Secondary | ICD-10-CM

## 2017-02-17 DIAGNOSIS — Z818 Family history of other mental and behavioral disorders: Secondary | ICD-10-CM

## 2017-02-17 DIAGNOSIS — F411 Generalized anxiety disorder: Secondary | ICD-10-CM | POA: Diagnosis not present

## 2017-02-17 DIAGNOSIS — Z810 Family history of intellectual disabilities: Secondary | ICD-10-CM | POA: Diagnosis not present

## 2017-02-17 DIAGNOSIS — F4310 Post-traumatic stress disorder, unspecified: Secondary | ICD-10-CM

## 2017-02-17 DIAGNOSIS — F5105 Insomnia due to other mental disorder: Secondary | ICD-10-CM

## 2017-02-17 DIAGNOSIS — Z811 Family history of alcohol abuse and dependence: Secondary | ICD-10-CM

## 2017-02-17 DIAGNOSIS — F332 Major depressive disorder, recurrent severe without psychotic features: Secondary | ICD-10-CM | POA: Diagnosis not present

## 2017-02-17 DIAGNOSIS — F99 Mental disorder, not otherwise specified: Secondary | ICD-10-CM

## 2017-02-17 DIAGNOSIS — S069X3S Unspecified intracranial injury with loss of consciousness of 1 hour to 5 hours 59 minutes, sequela: Secondary | ICD-10-CM | POA: Diagnosis not present

## 2017-02-17 DIAGNOSIS — R45851 Suicidal ideations: Secondary | ICD-10-CM

## 2017-02-17 MED ORDER — ARIPIPRAZOLE 5 MG PO TABS: 5.0000 mg | ORAL_TABLET | Freq: Every day | ORAL | 1 refills | Status: DC

## 2017-02-17 MED ORDER — SERTRALINE HCL 100 MG PO TABS: 200.0000 mg | ORAL_TABLET | Freq: Every day | ORAL | 1 refills | Status: DC
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
## 2017-02-21 ENCOUNTER — Ambulatory Visit (HOSPITAL_COMMUNITY): Payer: Self-pay | Admitting: Clinical

## 2017-02-23 ENCOUNTER — Ambulatory Visit (INDEPENDENT_AMBULATORY_CARE_PROVIDER_SITE_OTHER): Payer: Medicaid Other | Admitting: Neurology

## 2017-02-23 ENCOUNTER — Encounter: Payer: Self-pay | Admitting: Neurology

## 2017-02-23 VITALS — BP 104/70 | HR 72 | Wt 253.0 lb

## 2017-02-23 DIAGNOSIS — I63411 Cerebral infarction due to embolism of right middle cerebral artery: Secondary | ICD-10-CM

## 2017-02-23 DIAGNOSIS — T1491XA Suicide attempt, initial encounter: Secondary | ICD-10-CM

## 2017-02-23 DIAGNOSIS — R569 Unspecified convulsions: Secondary | ICD-10-CM

## 2017-02-23 DIAGNOSIS — F209 Schizophrenia, unspecified: Secondary | ICD-10-CM | POA: Diagnosis not present

## 2017-02-23 NOTE — Progress Notes (Signed)
STROKE NEUROLOGY FOLLOW UP NOTE  NAME: [name] Bell DOB: [birth year]  REASON FOR VISIT: stroke follow up HISTORY FROM: pt and chart  Today we had the pleasure of seeing [name] Bell in follow-up at our [facility]rology [facility]. Pt was accompanied by no one.   History Summary [name] Bell is a 36 y.o. female admitted on 02/09/2016 after drinking drano and jumping from a 5th floor window for suicidal attempt. She had significant trauma with multiple fractures and underwent many surgeries. She since then had significant recovery. However on 05/10/2016 she developed aphasia in the hospital with decreased movements on the L side. 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 CTA head and neck showed irregular right A3/A4 segments, but no carotid dissection. Repeat CT showed right frontal petechial hemorrhagic transformation, stable. DVT negative. TCD bubble study negative for PFO. Initial TTE showed right atrium bright target, highly mobile, concerning for possible thrombus/vegetation, but TEE showed all valve look fine with no vegetation, no LV or LA or LAA thrombus, normal LV function and ascending aorta. Hypercoagulable labs showed positive anticoagulant and mildly positive HPP and cardiolipin IgM, will need to repeat in 3 months. LDL 112 and A1C 5.4. She was put on ASA and lipitor on discharge to [facility] on 06/03/16.  Developed new onset seizure on 05/12/16, likely due to hemorrhagic infarct. Was treated with ativan and vimpat load. Continued on vimpat  bid. EEG showed right hemisphere slowing, no seizure. She was also on zoloft for depression. She was discharged on 8/10 from [facility].   07/27/16 follow up - the patient has been doing well. She had dramatic recovery. Her left UE back to normal and LLE still has mild paresis, but able to walk without cane. She is following with PT/OT.   On 07/06/16 pt was on the phone and had two  episodes of word finding difficulty, lasting 30s each and 1-2 min apart. She called 911 and at the time of EMS arrival, her symptoms all resolved. She was sent to ED and had CT head negative. She was told to follow up as outpt.  She also stated that she has daily episodes of sudden onset blurry vision in both eyes, like looking out from water drops, also felt lightheadedness. No HA, LOC, shaking or jerking. Episodes lasts 5 sec and gone. It happens several times a day and every day. She sometimes saw twinkling lights but no scotoma, flashing or wavy lines.  Interval History During the interval time, she has been doing well from stroke standpoint. LLE further improved and now walking without any cane. Had EEG repeat in 08/2016 and was normal. No seizure since discharge from hospital. Pt now complains of audio and visual hallucinations, more depression but no SI or HI. Followed with psychiatrist and concerning for schizophrenia and was put on abilify in addition to zoloft. She had recent psychiatry follow up 2 weeks ago. Bp stable 104/70.   REVIEW OF SYSTEMS: Full 14 system review of systems performed and notable only for those listed below and in HPI above, all others are negative:  Constitutional:  Activity change Cardiovascular:  palpitation Ear/Nose/Throat:   Skin: itching Eyes:  Light sensitivity, blurred vision, double vision, lost of vision Respiratory:   Gastroitestinal:  Nausea  Genitourinary:  Hematology/Lymphatic:   Endocrine: excessive thirst, excessive eating Musculoskeletal:  Joint pain Allergy/Immunology:   Neurological:  Memory loss, dizziness, HA, facial drooping Psychiatric: agitation, confusion, decreased  concentration, depression, nervous/anxious, hallucination, suicidal thoughts, hyperactive.  Sleep: restless leg, insomnia, frequent waking, daytime sleepiness, sleep talking  The following represents the patient's updated allergies and side effects list: No Known  Allergies  The neurologically relevant items on the patient's problem list were reviewed on today's visit.  Neurologic Examination  A problem focused neurological exam (12 or more points of the single system neurologic examination, vital signs counts as 1 point, cranial nerves count for 8 points) was performed.  Blood pressure 104/70, pulse 72, weight 253 lb (114.8 kg).  General - Well nourished, well developed, in no apparent distress.  Ophthalmologic - Sharp disc margins OU.  Cardiovascular - Regular rate and rhythm with no murmur.  Mental Status -  Level of arousal and orientation to time, place, and person were intact. Language including expression, naming, repetition, comprehension was assessed and found intact. Fund of Knowledge was assessed and was intact.  Cranial Nerves II - XII - II - Visual field intact OU. III, IV, VI - Extraocular movements intact. V - Facial sensation intact bilaterally. VII - Facial movement intact bilaterally. VIII - Hearing & vestibular intact bilaterally. X - Palate elevates symmetrically. XI - Chin turning & shoulder shrug intact bilaterally. XII - Tongue protrusion intact.  Motor Strength - The patient's strength was normal in all extremities except LLE proximal 5-/5 and knee extension 4/5, DF and PF 3+/5 and pronator drift was absent.  Bulk was normal and fasciculations were absent   Motor Tone - Muscle tone was assessed at the neck and appendages and was normal.  Reflexes - The patient's reflexes were 1+ in all extremities and she had no pathological reflexes.  Sensory - Light touch, temperature/pinprick were assessed and were normal.    Coordination - The patient had normal movements in the hands with no ataxia or dysmetria.  Tremor was absent.  Gait and Station - pt has mild left spastic hemiparetic gait.   Data reviewed: I personally reviewed the images and agree with the radiology interpretations.  Dg Chest 2 View 05/10/2016   Enlarging loculated right pleural fluid collection, possibly reaccumulation of an empyema. Probable airspace opacification in the right lung. Electronically Signed   By: Leanna Battles M.D.   On: 05/10/2016 16:00   Ct Head Wo Contrast 05/14/2016 1. Continued interval evolution of acute right MCA territory infarct with associated small volume petechial hemorrhage. Similar localized edema without significant mass effect or midline shift. 2. No new intracranial process.  05/13/2016 Evolution of the right frontal lobe infarct with a new area of hemorrhage within this infarct. No hemorrhage seen elsewhere. No new infarct seen elsewhere. No midline shift. No subdural or epidural fluid collections.  05/10/2016  SUBTLE AREA OF LOW DENSITY AND LOSS OF GRAY-WHITE DIFFERENTIATION IN THE ANTERIOR RIGHT FRONTAL LOBE COMPATIBLE WITH ACUTE INFARCTION.  02/10/2016 No acute intracranial abnormality or acute abnormality of the cervical spine. Sinus disease is likely related to intubation and OG tube placement. The patient's OG tube is looped in the pharynx and mouth.  Mr Brain Wo Contrast 05/10/2016  Subacute to chronic appearing RIGHT frontoparietal lobe infarct in RIGHT MCA distribution with petechial hemorrhage. Findings less likely represent venous infarct. 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   Ct Angio head & Neck W Or Wo Contrast 05/11/2016   Progression of cytotoxic edema compared with the initial CT from 05/10/2016 in the RIGHT frontal lobe. Moderate irregularity of the A3 and A4 segments,  anterior cerebral artery on the RIGHT, correlates with the observed pattern of restricted diffusion on MR; considerations would include vasculitis, intracranial atherosclerosis, or vasospasm. No intracranial large vessel occlusion or extracranial stenosis of significance. No findings to support cortical venous or dural venous thrombosis. Suspected  recurrent empyema in the RIGHT chest, 9 x 10 cm cross-section with air-fluid level. Recommend CT chest with contrast for further evaluation.   2D Echocardiogram  02/25/2016 - Left ventricle: The cavity size was normal. Systolic function was normal. The estimated ejection fraction was in the range of 60% to 65%. Wall motion was normal; there were no regional wall motion abnormalities. Left ventricular diastolic function parameters were normal. - Aortic valve: Trileaflet; mildly thickened leaflets. Valve area (VTI): 1.91 cm^2. Valve area (Vmax): 2.11 cm^2. Valve area (Vmean): 2.14 cm^2. - Right atrium: Mobile linear density seen in the right atrium.  This most likely represents a catheter. There is a bright target off of this that is highly mobile and concerning for possible thrombus or vegetation. This is not seen in all views. Recommend TEE for further evaluation is clinically indicated. - Pulmonary arteries: PA peak pressure: 39 mm Hg (S). Impressions:   The right ventricular systolic pressure was increased consistent with mild pulmonary hypertension.  TCD bubble study - negative for PFO  LE venous doppler - negative for DVT   Ct Head Wo Contrast 05/13/2016  IMPRESSION: Evolution of the right frontal lobe infarct with a new area of hemorrhage within this infarct. No hemorrhage seen elsewhere. No new infarct seen elsewhere. No midline shift. No subdural or epidural fluid collections.   EEG 05/13/16 This is an abnormal EEG due to focal right hemispheric slowing. This is consistent with known stroke in that region. No seizures or epileptiform discharges are seen on this study.   TEE 05/14/16 all valve look fine with no vegetation, no LV or LA or LAA thrombus, normal LV function. Ascending aorta looks normal  EEG 08/18/16 - normal EEG recording in the waking state. No evidence of ictal or interictal discharges are seen.   Component     Latest Ref Rng & Units 05/11/2016  Cholesterol     0 - 200  mg/dL 161  Triglycerides     <150 mg/dL 096  HDL Cholesterol     >40 mg/dL 46  Total CHOL/HDL Ratio     RATIO 3.9  VLDL     0 - 40 mg/dL 22  LDL (calc)     0 - 99 mg/dL 045 (H)  Alpha galactosidase, serum     28.0 - 80.0 nmol/hr/mg prt 67.9  Interpretation      Comment  Director Review      Comment  Methodology      Comment  PTT Lupus Anticoagulant     0.0 - 51.9 sec 67.3 (H)  DRVVT     0.0 - 47.0 sec 68.7 (H)  Lupus Anticoag Interp      Comment:  Beta-2 Glycoprotein I Ab, IgG     0 - 20 GPI IgG units <9  Beta-2-Glycoprotein I IgM     0 - 32 GPI IgM units <9  Beta-2-Glycoprotein I IgA     0 - 25 GPI IgA units 11  Anticardiolipin Ab,IgG,Qn     0 - 14 GPL U/mL <9  Anticardiolipin Ab,IgM,Qn     0 - 12 MPL U/mL 17 (H)  Anticardiolipin Ab,IgA,Qn     0 - 11 APL U/mL <9  Hemoglobin A1C  4.8 - 5.6 % 5.4  Mean Plasma Glucose     mg/dL 045  Homocysteine     0.0 - 15.0 umol/L 15.0  Sickle Cell Screen     Negative Negative  PTT-LA Mix     0.0 - 48.9 sec 59.5 (H)  Hexagonal Phase Phospholipid     0 - 11 sec 12 (H)  dRVVT Mix     0.0 - 47.0 sec 49.7 (H)  dRVVT Confirm     0.8 - 1.2 ratio 1.2    Assessment: As you may recall, she is a 36 y.o. African American female with PMH of admitted on 02/09/2016 after suicidal attempt. She had significant trauma with multiple fractures and underwent many surgeries with significant recovery. However on 05/10/2016 she had right ACA and MCA/ACA infarcts with petechial hemorrhage as well as evidence of severe diffuse axonal injury. CTA head and neck showed irregular right A3/A4 segments, but no carotid dissection. Repeat CT showed right frontal petechial hemorrhagic transformation, stable. DVT negative. TCD bubble study negative for PFO. Initial TTE showed right atrium bright target, highly mobile, concerning for possible thrombus/vegetation, but TEE showed all valve look fine with no vegetation, no LV or LA or LAA thrombus, normal LV function  and ascending aorta. Hypercoagulable labs only showed positive anticoagulant and mildly positive HPP and cardiolipin IgM, will need to repeat in 3 months. LDL 112 and A1C 5.4. Developed new onset seizure on 05/12/16, likely due to hemorrhagic infarct. Put on vimpat  bid. EEG showed right hemisphere slowing, no seizure. She was put on ASA and lipitor on discharge to [facility] on 06/03/16. During the interval time, the patient has dramatic recovery. Her left UE back to normal and LLE still has mild paresis, but able to walk without cane. She finished PT/OT. Repeat EEG normal. Will consider to taper off keppra in one year (total 2 years). She had recent hallucination, following with psychiatry and treating for schizophrenia and depression. Her repeat hypercoagulable tests were not done last time, will do it this time.   Plan:  - continue ASA and lipitor for stroke prevention - continue keppra for seizure control - repeat prior positive hypercoagulable tests  - continue home exercise for LLE weakness - follow up with psychiatry and continue zoloft and abilify - Follow up with your primary care physician for stroke risk factor modification. Recommend maintain blood pressure goal <130/80, diabetes with hemoglobin A1c goal below 7.0% and lipids with LDL cholesterol goal below 70 mg/dL.  - follow up in one year with me and consider taper off keppra.   I spent more than 25 minutes of face to face time with the patient. Greater than 50% of time was spent in counseling and coordination of care. We discussed duration of seizure medication, follow up with psychiatry and home exercise.   Orders Placed This Encounter  Procedures  . Lupus anticoagulant  . Cardiolipin antibodies, IgM+IgG  . Hexagonal Phospholipid Neutralization    No orders of the defined types were placed in this encounter.   Patient Instructions  - continue ASA and lipitor for stroke prevention - continue keppra for seizure control - repeat  prior positive hypercoagulable tests  - continue home exercise for LLE weakness - follow up with psychiatry and continue zoloft and abilify - Follow up with your primary care physician for stroke risk factor modification. Recommend maintain blood pressure goal <130/80, diabetes with hemoglobin A1c goal below 7.0% and lipids with LDL cholesterol goal below 70 mg/dL.  - follow  up in one year with me and consider taper off keppra.    Marvel Plan, MD PhD [facility] [address], [address] [place], [place] [address] [phone]

## 2017-02-23 NOTE — Patient Instructions (Addendum)
-   continue ASA and lipitor for stroke prevention - continue keppra for seizure control - repeat prior positive hypercoagulable tests  - continue home exercise for LLE weakness - follow up with psychiatry and continue zoloft and abilify - Follow up with your primary care physician for stroke risk factor modification. Recommend maintain blood pressure goal <130/80, diabetes with hemoglobin A1c goal below 7.0% and lipids with LDL cholesterol goal below 70 mg/dL.  - follow up in one year with me and consider taper off keppra.

## 2017-03-01 LAB — LUPUS ANTICOAGULANT
DRVVT: 40.4 s (ref 0.0–47.0)
PTT LA: 37 s (ref 0.0–51.9)
Thrombin Time: 20.1 s (ref 0.0–23.0)
dPT Confirm Ratio: 1.05 Ratio (ref 0.00–1.40)
dPT: 43.6 s (ref 0.0–55.0)

## 2017-03-01 LAB — CARDIOLIPIN ANTIBODIES, IGM+IGG
ANTICARDIOLIPIN IGM: 13 [MPL'U]/mL — AB (ref 0–12)
Anticardiolipin IgG: <9 GPL U/mL (ref 0–14)

## 2017-03-01 LAB — HEXAGONAL PHOSPHOLIPID NEUTRALIZATION: HEXAGONAL PHOSPHOLIPID NEUTRAL: 8 s

## 2017-03-02 ENCOUNTER — Telehealth: Payer: Self-pay

## 2017-03-02 NOTE — Telephone Encounter (Signed)
-----   Message from Marvel Plan, MD sent at 03/01/2017 12:31 PM EDT ----- Could you please let the patient know that the repeat blood tests done recently in our office showed all numbers previously abnormal are now pretty much normalized. No need change of treatment plan. Please continue current treatment. Thanks.  Marvel Plan, MD PhD Stroke Neurology 03/01/2017 12:31 PM

## 2017-03-02 NOTE — Telephone Encounter (Signed)
RN call patient that her blood test were normal, the previous ones were abnormal. ITs pretty much normal. Rn stated no change in treatment plan. Continue treatment plan. Pt verbalized understanding.

## 2017-03-05 ENCOUNTER — Other Ambulatory Visit: Payer: Self-pay | Admitting: Neurology

## 2017-03-05 DIAGNOSIS — R569 Unspecified convulsions: Secondary | ICD-10-CM

## 2017-03-10 ENCOUNTER — Encounter (HOSPITAL_COMMUNITY): Payer: Self-pay | Admitting: Clinical

## 2017-03-10 ENCOUNTER — Telehealth (HOSPITAL_COMMUNITY): Payer: Self-pay | Admitting: Psychiatry

## 2017-03-10 ENCOUNTER — Ambulatory Visit (INDEPENDENT_AMBULATORY_CARE_PROVIDER_SITE_OTHER): Payer: Medicaid Other | Admitting: Clinical

## 2017-03-10 DIAGNOSIS — F203 Undifferentiated schizophrenia: Secondary | ICD-10-CM | POA: Diagnosis not present

## 2017-03-10 MED ORDER — QUETIAPINE FUMARATE 50 MG PO TABS: 50.0000 mg | ORAL_TABLET | Freq: Every day | ORAL | 0 refills | Status: DC

## 2017-03-10 NOTE — Telephone Encounter (Signed)
Spoke with pt who states she is not sleeping and is still experiencing AVH. States Abilify is not effective. P: Will d/c Abilify and Start Seroquel 50mg  qHS. F/up appt scheduled for next week    By Oletta DarterSalina Lanier Millon, MD

## 2017-03-11 ENCOUNTER — Ambulatory Visit: Payer: Self-pay | Admitting: Family Medicine

## 2017-03-14 ENCOUNTER — Encounter: Payer: Self-pay | Admitting: Physical Medicine & Rehabilitation

## 2017-03-14 ENCOUNTER — Encounter: Payer: Medicaid Other | Attending: Physical Medicine & Rehabilitation | Admitting: Physical Medicine & Rehabilitation

## 2017-03-14 VITALS — BP 116/79 | HR 93 | Resp 14

## 2017-03-14 DIAGNOSIS — Z9151 Personal history of suicidal behavior: Secondary | ICD-10-CM

## 2017-03-14 DIAGNOSIS — F329 Major depressive disorder, single episode, unspecified: Secondary | ICD-10-CM | POA: Diagnosis not present

## 2017-03-14 DIAGNOSIS — G8194 Hemiplegia, unspecified affecting left nondominant side: Secondary | ICD-10-CM

## 2017-03-14 DIAGNOSIS — R531 Weakness: Secondary | ICD-10-CM | POA: Insufficient documentation

## 2017-03-14 DIAGNOSIS — F4310 Post-traumatic stress disorder, unspecified: Secondary | ICD-10-CM | POA: Insufficient documentation

## 2017-03-14 DIAGNOSIS — I69854 Hemiplegia and hemiparesis following other cerebrovascular disease affecting left non-dominant side: Secondary | ICD-10-CM | POA: Insufficient documentation

## 2017-03-14 DIAGNOSIS — Z915 Personal history of self-harm: Secondary | ICD-10-CM | POA: Diagnosis not present

## 2017-03-14 DIAGNOSIS — M25551 Pain in right hip: Secondary | ICD-10-CM | POA: Diagnosis not present

## 2017-03-14 DIAGNOSIS — Z981 Arthrodesis status: Secondary | ICD-10-CM | POA: Diagnosis present

## 2017-03-14 DIAGNOSIS — R42 Dizziness and giddiness: Secondary | ICD-10-CM | POA: Insufficient documentation

## 2017-03-14 DIAGNOSIS — H538 Other visual disturbances: Secondary | ICD-10-CM | POA: Insufficient documentation

## 2017-03-14 DIAGNOSIS — S3282XS Multiple fractures of pelvis without disruption of pelvic ring, sequela: Secondary | ICD-10-CM | POA: Diagnosis not present

## 2017-03-14 DIAGNOSIS — Z9049 Acquired absence of other specified parts of digestive tract: Secondary | ICD-10-CM | POA: Diagnosis not present

## 2017-03-14 DIAGNOSIS — R569 Unspecified convulsions: Secondary | ICD-10-CM | POA: Insufficient documentation

## 2017-03-14 DIAGNOSIS — Z818 Family history of other mental and behavioral disorders: Secondary | ICD-10-CM | POA: Diagnosis not present

## 2017-03-14 DIAGNOSIS — Z823 Family history of stroke: Secondary | ICD-10-CM | POA: Diagnosis not present

## 2017-03-14 DIAGNOSIS — Z8249 Family history of ischemic heart disease and other diseases of the circulatory system: Secondary | ICD-10-CM | POA: Diagnosis not present

## 2017-03-14 DIAGNOSIS — S069X3S Unspecified intracranial injury with loss of consciousness of 1 hour to 5 hours 59 minutes, sequela: Secondary | ICD-10-CM | POA: Diagnosis not present

## 2017-03-14 DIAGNOSIS — F909 Attention-deficit hyperactivity disorder, unspecified type: Secondary | ICD-10-CM | POA: Diagnosis not present

## 2017-03-14 DIAGNOSIS — Z9889 Other specified postprocedural states: Secondary | ICD-10-CM | POA: Diagnosis not present

## 2017-03-14 DIAGNOSIS — R200 Anesthesia of skin: Secondary | ICD-10-CM | POA: Diagnosis not present

## 2017-03-14 NOTE — Progress Notes (Signed)
Subjective:    Patient ID: [name] Bell, female    DOB: [birth year], 36 y.o.   MRN: [id number]  HPI   [name] Bell is here in follow up of her TBI/CVA and associated pain  Deficits. She is being followed closely by psychiatry for her mood related issues and the feeling now is that there is a larger schizophrenic component. Overall she has felt better with the medication changes and psychological support.   She felt that the diclofenac has made a big difference for her pelvic pain. She is taking it twice daily without any problems. She has been able to pursue more exercise and activities as a result. She still struggles somewhat when she has to sit for along period of time which causes buttock pain.   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 She is interested in getting further education down the road.   Pain Inventory Average Pain 3 Pain Right Now 0 My pain is no selection  In the last 24 hours, has pain interfered with the following? General activity 5 Relation with others 5 Enjoyment of life 5 What TIME of day is your pain at its worst? night Sleep (in general) Fair  Pain is worse with: bending, sitting and inactivity Pain improves with: therapy/exercise, pacing activities and medication Relief from Meds: 8  Mobility walk without assistance ability to climb steps?  yes do you drive?  no Do you have any goals in this area?  yes  Function not employed: date last employed . disabled: date disabled . I need assistance with the following:  bathing, meal prep, household duties and shopping Do you have any goals in this area?  yes  Neuro/Psych tremor dizziness confusion depression anxiety  Prior Studies Any changes since last visit?  no  Physicians involved in your care Any changes since last visit?  no   Family History  Problem Relation Age of Onset  . Hypertension Father   . Mental illness Mother   . Dementia Mother   . Depression Mother   . OCD Mother   .  Paranoid behavior Mother   . Physical abuse Mother   . Sexual abuse Mother   . Stroke Maternal Aunt   . Alcohol abuse Maternal Aunt   . Hypertension Sister   . Anxiety disorder Sister   . Depression Sister   . Physical abuse Sister   . Sexual abuse Sister   . Fibroids Sister   . Alcohol abuse Paternal Aunt   . Alcohol abuse Maternal Uncle   . Alcohol abuse Paternal Uncle   . Depression Cousin    Social History   Social History  . Marital status: Single    Spouse name: N/A  . Number of children: N/A  . Years of education: N/A   Social History Main Topics  . Smoking status: Never Smoker  . Smokeless tobacco: Never Used  . Alcohol use No     Comment: unknown  . Drug use: No  . Sexual activity: No   Other Topics Concern  . 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 APPLICATION OF WOUND VAC  02/09/2016   Procedure: APPLICATION OF WOUND VAC;  Surgeon: Jimmye Norman, MD;  Location: [facility] OR;  Service: General;;  . APPLICATION OF WOUND VAC N/A 02/16/2016   Procedure: RE-APPLICATION OF WOUND VAC;  Surgeon: [name], MD;  Location: [facility] OR;  Service: General;  Laterality: N/A;  . BOWEL RESECTION  02/09/2016   Procedure: SMALL BOWEL RESECTION, MESENTERIC REPAIR;  Surgeon: Jimmye Norman, MD;  Location: [facility] OR;  Service: General;;  . CHEST TUBE INSERTION Right 02/11/2016   Procedure: CHEST TUBE INSERTION;  Surgeon: Jimmye Norman, MD;  Location: [facility] OR;  Service: General;  Laterality: Right;  . CHEST TUBE INSERTION Right 02/26/2016   Procedure: CHEST TUBE INSERTION;   Surgeon: Kerin Perna, MD;  Location: [facility] OR;  Service: Thoracic;  Laterality: Right;  . CHOLECYSTECTOMY  02/09/2016   Procedure: CHOLECYSTECTOMY;  Surgeon: Jimmye Norman, MD;  Location: [facility] OR;  Service: General;;  . ESOPHAGOGASTRODUODENOSCOPY N/A 02/10/2016   Procedure: ESOPHAGOGASTRODUODENOSCOPY (EGD);  Surgeon: Sherrilyn Rist, MD;  Location: [facility] ENDOSCOPY;  Service: Endoscopy;  Laterality: N/A;  . EXTERNAL FIXATION PELVIS  02/09/2016   Procedure: EXTERNAL FIXATION PELVIS;  Surgeon: Myrene Galas, MD;  Location: [facility] OR;  Service: Orthopedics;;  . HEMATOMA EVACUATION Right 05/14/2016   Procedure: EVACUATION HEMATOMA;  Surgeon: Alleen Borne, MD;  Location: [facility] OR;  Service: Thoracic;  Laterality: Right;  Evacuation of Hematoma Right Chest  . LACERATION REPAIR  02/09/2016   Procedure: REPAIR LIVER LACERATION;  Surgeon: Jimmye Norman, MD;  Location: [facility] OR;  Service: General;;  . LAPAROTOMY N/A 02/10/2016   Procedure: EXPLORATORY LAPAROTOMY, removal of packs,  cauterization of liver, repacking of liver, and open abdomen vac application;  Surgeon: [name], MD;  Location: [facility] OR;  Service: General;  Laterality: N/A;  . LAPAROTOMY N/A 02/11/2016   Procedure: EXPLORATORY LAPAROTOMY VAC CHANGE ;  Surgeon: Jimmye Norman, MD;  Location: [facility] OR;  Service: General;  Laterality: N/A;  . LAPAROTOMY N/A 02/13/2016   Procedure: EXPLORATORY LAPAROTOMY, REMOVAL OF PACKS, ABDOMINAL VAC DRESSING CHANGE;  Surgeon: [name], MD;  Location: [facility] OR;  Service: General;  Laterality: N/A;  . LAPAROTOMY N/A 02/18/2016   Procedure: EXPLORATORY LAPAROTOMY, PLACEMENT OF ABRA ABDOMINAL WALL CLOSURE SET;  Surgeon: Jimmye Norman, MD;  Location: [facility] OR;  Service: General;  Laterality: N/A;  . LAPAROTOMY N/A 02/09/2016   Procedure: EXPLORATORY LAPAROTOMY;  Surgeon: Jimmye Norman, MD;  Location: [facility] OR;  Service: General;  Laterality: N/A;  . LAPAROTOMY N/A 02/16/2016   Procedure: EXPLORATORY LAPAROTOMY, ABDOMINAL WASH OUT;  Surgeon: [name], MD;  Location: [facility] OR;  Service: General;  Laterality: N/A;  . PERCUTANEOUS TRACHEOSTOMY N/A 03/01/2016   Procedure: PERCUTANEOUS TRACHEOSTOMY;  Surgeon: Jimmye Norman, MD;  Location: [facility] OR;  Service: General;  Laterality: N/A;  . SACRO-ILIAC PINNING Right 02/16/2016   Procedure: Loyal Gambler;  Surgeon: Myrene Galas, MD;  Location: [facility] OR;  Service: Orthopedics;  Laterality: Right;  . TEE WITHOUT CARDIOVERSION N/A 05/14/2016   Procedure: TRANSESOPHAGEAL ECHOCARDIOGRAM (TEE);  Surgeon: Alleen Borne, MD;  Location: [facility] OR;  Service: Thoracic;  Laterality: N/A;  . THORACOTOMY/LOBECTOMY Right 04/26/2016   Procedure: Right THORACOTOMY AND DRAINAGE OF EMPYEMA;  Surgeon: Alleen Borne, MD;  Location: [facility] OR;  Service: Thoracic;  Laterality: Right;  Marland Kitchen VIDEO ASSISTED THORACOSCOPY (VATS)/THOROCOTOMY Right 05/14/2016  Procedure: RIGHT VIDEO ASSISTED THORACOSCOPY (VATS),DRAINAGE OF EMPYEMA;  Surgeon: Alleen Borne, MD;  Location: [facility] OR;  Service: Thoracic;  Laterality: Right;  . WOUND DEBRIDEMENT N/A 03/01/2016   Procedure: ABDOMINAL WOUND CLOSURE;  Surgeon: Jimmye Norman, MD;  Location: [facility] OR;  Service: General;  Laterality: N/A;   Past Medical History:  Diagnosis Date  . ADD (attention deficit disorder)    has been on medication  . ADHD (attention deficit hyperactivity disorder)   . Anemia   . Anxiety   . Depression   . H/O suicide attempt    at age 36.   Marland Kitchen PTSD (post-traumatic stress disorder)   . Seizures (HCC)   . Stroke (HCC)    BP 116/79 (BP Location: Left Arm, Patient Position: Sitting, Cuff Size: Large)   Pulse 93   Resp 14   SpO2 99%   Opioid Risk Score:   Fall Risk Score:  `1  Depression screen PHQ 2/9  Depression screen [facility] 2/9 01/12/2017 09/24/2016 09/14/2016 07/14/2016 07/09/2016  Decreased Interest 3 0 0 0 0  Down, Depressed, Hopeless 3 2 0 0 0  PHQ - 2 Score 6 2 0 0 0  Altered sleeping - 3 - 0 -  Tired, decreased energy - 0 - 1 -  Change in appetite - 0 - 0 -  Feeling  bad or failure about yourself  - 0 - 0 -  Trouble concentrating - 0 - 2 -  Moving slowly or fidgety/restless - 0 - 0 -  Suicidal thoughts - 0 - 0 -  PHQ-9 Score - 5 - 3 -  Difficult doing work/chores - - - Not difficult at all -    Review of Systems  Constitutional: Positive for unexpected weight change.  HENT: Negative.   Eyes: Negative.   Respiratory: Negative.   Cardiovascular: Negative.   Gastrointestinal: Negative.   Endocrine: Negative.   Genitourinary: Negative.   Musculoskeletal: Negative.   Skin: Negative.   Allergic/Immunologic: Negative.   Neurological: Positive for dizziness and tremors.  Hematological: Negative.   Psychiatric/Behavioral: Positive for confusion and dysphoric mood. The patient is nervous/anxious.   All other systems reviewed and are negative.      Objective:   Physical Exam  Constitutional: She appears well-developedand well-nourished.  HENT: Normocephalicand atraumatic.  Eyes: Conjunctivaeand EOM are normal.  Cardiovascular: RRR.  Respiratory: CTA B   GI: Soft. Bowel sounds are normal. She exhibits no distension. There is no tenderness.   Musculoskeletal: Mild SI area tenderness to palpation Neurological: She is alertand oriented.  Speech clear. Able to follow commands without difficulty.  Sensation intact in the left lower extremity Deep tendon reflexes 3+ left patellar, 2+ right patella Motor strength: 4+/5 in the right deltoid, biceps, triceps, grip, RLE: 5/5 hip flexor, knee extensor, ankle dorsiflexor. LUE: 4+/5 left deltoid, biceps, triceps, grip LLE; 4/5, hip flexion, knee extension, 4-/5 at the ankle dorsiflexor and 3-4/5plantar flexor. Moving much better. Tends to step down onto the left leg with pelvic dip. Better the longer she walked and with verbal cueing. Skin: Skin is warmand dry. No rashnoted. No erythema.  Psychiatric:   Thought contentnormal and at baseline.  Pleasant, much more up beat  today    Assessment & Plan:  1. 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    -We discussed apprropriate mechanics and posture 2.. Mood:  .   -continue outpt psychology/psychiatric follow up at [facility]                       -continue using memory strategies and keeping a calendar/schedule   -I totally support her vocational efforts 3. TBI/R-MCA infarct with DAI with hemorrhage:  -discussed vocational re-entry. She will be taking a class to see how she handles the work.  -see above 4. New onset seizures: continue keppra.   Follow up in 4months. 15 minutes of face to face patient care time were spent during this visit. All questions were encouraged and answered.

## 2017-03-14 NOTE — Patient Instructions (Addendum)
PLEASE FEEL FREE TO CALL OUR OFFICE WITH ANY PROBLEMS OR QUESTIONS [phone])    REMEMBER POSTURE. USE YOUR "BUTT MUSCLES" AND BACK TO HELP KEEP YOUR PELVIS BALANCED WHEN YOU WALK.

## 2017-03-14 NOTE — Progress Notes (Signed)
   THERAPIST PROGRESS NOTE  Session Time: 8:03 - 8:57  Participation Level: Active  Behavioral Response: CasualAlertDepressed  Type of Therapy: Individual Therapy  Treatment Goals addressed: improve psychiatric symptoms, decrease delusions and hallucinations,  Improve unhelpful thoughts, ,  Learn about diagnosis, healthy coping skills  Interventions: motivational interviewing, psychoeducation  Summary: [name] Bell is a 36 y.o. female who presents with  PTSD.   Suicidal/Homicidal: Nowithout intent/plan  Therapist Response: [name] Bell met with clinician for an individual sessions. She discussed her phychiatric symptoms and her current life events. [name] Bell shared that she has continued to experience a lot of depression. Clinician asked open ended questions. She shared that she has decreased sleep (3-5 hours per night) she shared that she wakes up multiple times. She shared that she has ups and downs both phycially and emotionally. She stated she has experienced passive suicidal thoughts. She shared that her hallucinations have continued. Client and clinician discussed her diagnosis and her symptoms.Nurse joined session briefly and made note of her symptoms. [name] Bell was asked to stop by the nurses station after the session. [name] Bell denied any current suicidal or homicidal ideation. Client and clinician discussed what she could do  If her symptoms increased. [name] Bell shared that she is feeling overwhelmed by her "trauma, and badluck" Clinician asked open ended questions and [name] Bell identified some of her negative automatic thoughts. Clincian introduced a 7 panel thought record sheet. Clinician asked open ended question and [name] Bell identified the evidence for and against the thoughts, with some help she was able to identify healthier alternative thoughts. Client and clincian discussed the process.  Plan: Return again in 1-2 weeks.  Diagnosis: Axis I: PTSD      Wolf Boulay A, LCSW 03/14/2017

## 2017-03-17 ENCOUNTER — Encounter (HOSPITAL_COMMUNITY): Payer: Self-pay | Admitting: Psychiatry

## 2017-03-17 ENCOUNTER — Other Ambulatory Visit (HOSPITAL_COMMUNITY): Payer: Self-pay | Admitting: Psychiatry

## 2017-03-17 ENCOUNTER — Ambulatory Visit (INDEPENDENT_AMBULATORY_CARE_PROVIDER_SITE_OTHER): Payer: Medicaid Other | Admitting: Psychiatry

## 2017-03-17 DIAGNOSIS — F411 Generalized anxiety disorder: Secondary | ICD-10-CM

## 2017-03-17 DIAGNOSIS — Z79899 Other long term (current) drug therapy: Secondary | ICD-10-CM | POA: Diagnosis not present

## 2017-03-17 DIAGNOSIS — F332 Major depressive disorder, recurrent severe without psychotic features: Secondary | ICD-10-CM | POA: Diagnosis not present

## 2017-03-17 DIAGNOSIS — Z7982 Long term (current) use of aspirin: Secondary | ICD-10-CM | POA: Diagnosis not present

## 2017-03-17 DIAGNOSIS — Z811 Family history of alcohol abuse and dependence: Secondary | ICD-10-CM

## 2017-03-17 DIAGNOSIS — F4310 Post-traumatic stress disorder, unspecified: Secondary | ICD-10-CM | POA: Diagnosis not present

## 2017-03-17 DIAGNOSIS — Z810 Family history of intellectual disabilities: Secondary | ICD-10-CM | POA: Diagnosis not present

## 2017-03-17 DIAGNOSIS — G4700 Insomnia, unspecified: Secondary | ICD-10-CM | POA: Diagnosis not present

## 2017-03-17 DIAGNOSIS — Z818 Family history of other mental and behavioral disorders: Secondary | ICD-10-CM

## 2017-03-17 MED ORDER — BENZTROPINE MESYLATE 0.5 MG PO TABS: 1.0000 mg | ORAL_TABLET | Freq: Two times a day (BID) | ORAL | 1 refills | Status: DC

## 2017-03-17 MED ORDER — QUETIAPINE FUMARATE 100 MG PO TABS: 100.0000 mg | ORAL_TABLET | Freq: Every day | ORAL | 1 refills | Status: DC

## 2017-03-17 MED ORDER — SERTRALINE HCL 100 MG PO TABS: 200.0000 mg | ORAL_TABLET | Freq: Every day | ORAL | 1 refills | Status: DC
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
## 2017-04-01 ENCOUNTER — Other Ambulatory Visit (HOSPITAL_COMMUNITY): Payer: Self-pay | Admitting: Psychiatry

## 2017-04-01 DIAGNOSIS — F332 Major depressive disorder, recurrent severe without psychotic features: Secondary | ICD-10-CM

## 2017-04-06 ENCOUNTER — Encounter (HOSPITAL_COMMUNITY): Payer: Self-pay | Admitting: Clinical

## 2017-04-06 ENCOUNTER — Ambulatory Visit (INDEPENDENT_AMBULATORY_CARE_PROVIDER_SITE_OTHER): Payer: Medicaid Other | Admitting: Clinical

## 2017-04-06 DIAGNOSIS — F203 Undifferentiated schizophrenia: Secondary | ICD-10-CM | POA: Diagnosis not present

## 2017-04-06 DIAGNOSIS — F4310 Post-traumatic stress disorder, unspecified: Secondary | ICD-10-CM | POA: Diagnosis not present

## 2017-04-06 NOTE — Progress Notes (Signed)
   THERAPIST PROGRESS NOTE  Session Time: 8:02 -9:00   Participation Level: Active  Behavioral Response: CasualAlertDepressed  Type of Therapy: Individual Therapy  Treatment Goals addressed: improve psychiatric symptoms, decrease delusions and hallucinations,  Improve unhelpful thoughts,  elevate mood (re-entry into school or work force, Learn about diagnosis, healthy coping skills  Interventions:  Motivational interviewing and psychoeduation  Summary: [name] Bell is a 36 y.o. female who presents with  Undifferentiated schizophrenia and PTSD   Suicidal/Homicidal: Nowithout intent/plan  Therapist Response: Zakhia met with clinician for an individual sessions. She discussed her phychiatric symptoms and her current life events. [name] shared that she has continued to experience a lot of depression. Clinician asked open ended questions. She shared that she has decreased sleep (3-5 hours per night) she shared that she wakes up multiple times. She shared that she has ups and downs both phycially and emotionally. She stated she has experienced passive suicidal thoughts. She shared that her hallucinations have continued. Nurse joined session briefly and made note of her symptoms. [name] was asked to stop by the nurses station after the session. [name] denied any current suicidal or homicidal ideation. Client and clinician discussed what she could do  If her symptoms increased. [name] shared that she is feeling overwhelmed by her "trauma, and badluck" Clinician asked open ended questions and [name] identified some of her negative automatic thoughts. Clincian introduced a 7 panel thought record sheet. Clinician asked open ended question and [name] identified the evidence for and against the thoughts, with some help she was able to identify healthier alternative thoughts. Client and clincian discussed the process.Client and clinician reviewed and discussed and reviewed her homework on depression ( cbt based).  Client and clinician discussed her diagnosis and depression as one aspect of it. She shared that she thought the idea of having a schedule would be helpful. Client and clinician discussed what her schedule might look like. Client and clinician discussed the benefits of keeping a schedule and how it might help improve her symptoms.    Plan: Return in 1-3 weeks.  Diagnosis: Axis I: Undifferentiated schizophrenia and PTSD    Garion Wempe A, LCSW 04/06/2017

## 2017-04-11 ENCOUNTER — Encounter (HOSPITAL_COMMUNITY): Payer: Self-pay | Admitting: Clinical

## 2017-04-11 ENCOUNTER — Ambulatory Visit (HOSPITAL_COMMUNITY): Payer: Medicaid Other | Admitting: Clinical

## 2017-04-11 DIAGNOSIS — F203 Undifferentiated schizophrenia: Secondary | ICD-10-CM

## 2017-04-11 DIAGNOSIS — F4310 Post-traumatic stress disorder, unspecified: Secondary | ICD-10-CM

## 2017-04-11 NOTE — Progress Notes (Signed)
   THERAPIST PROGRESS NOTE  Session Time: 8:20 - 9:05   Participation Level: Active  Behavioral Response: CasualAlertAnxious   Type of Therapy: Individual Therapy  Treatment Goals addressed: improve psychiatric symptoms, decrease delusions and hallucinations,  Improve unhelpful thoughts,  elevate mood ,   healthy coping skills  Interventions: motivational interviewing, CBT,   Summary: [name] Bell is Bell 36 y.o. female who presents Undifferentiated schizophrenia and PTSD  Suicidal/Homicidal: Nowithout intent/plan  Therapist Response: Sherry met with clinician for an individual session. She discussed her psychiatric symptoms and her current life events. [name] Bell shared that her mood has been improving for the most part. She shared she is taking her medications as prescribed. She stated that she did have some difficult days. She shared about an event that caused her to be distressed. Client and clinician discussed the event and her negative automatic thoughts, and the coping skills she used. Clinician asked open ended questions and [name] Bell identified addition coping skills. [name] Bell shared that she has begun doing art again. She shared that when she is doing art if pushes the voices further back. She then shared that she is less likely to get sucked in to conversations with them. Client and clinician discussed how choice of focus can help decrease hallucinations and delusions. Client and clinician also discussed other areas of focus.  [name] Bell shared about events that made her irritated. Clinician asked open ended questions and [name] Bell shared her negative automatic thoughts. Clinician introduced some basic cbt. Client and clinician discussed how to challenge and change unhelpful thoughts.     Plan: Return in 1-2 weeks.  Diagnosis: Axis I: Undifferentiated schizophrenia and PTSD   [name] Leiphart A, LCSW 04/11/2017

## 2017-04-12 ENCOUNTER — Other Ambulatory Visit: Payer: Self-pay | Admitting: Family Medicine

## 2017-04-12 ENCOUNTER — Other Ambulatory Visit (HOSPITAL_COMMUNITY): Payer: Self-pay | Admitting: Psychiatry

## 2017-04-12 DIAGNOSIS — F332 Major depressive disorder, recurrent severe without psychotic features: Secondary | ICD-10-CM

## 2017-04-12 DIAGNOSIS — F411 Generalized anxiety disorder: Secondary | ICD-10-CM

## 2017-04-12 DIAGNOSIS — F4310 Post-traumatic stress disorder, unspecified: Secondary | ICD-10-CM

## 2017-04-18 ENCOUNTER — Ambulatory Visit (HOSPITAL_COMMUNITY): Payer: Self-pay | Admitting: Clinical

## 2017-04-25 ENCOUNTER — Ambulatory Visit (HOSPITAL_COMMUNITY): Payer: Self-pay | Admitting: Clinical

## 2017-05-02 ENCOUNTER — Ambulatory Visit (HOSPITAL_COMMUNITY): Payer: Self-pay | Admitting: Clinical

## 2017-05-10 ENCOUNTER — Encounter (HOSPITAL_COMMUNITY): Payer: Self-pay | Admitting: Clinical

## 2017-05-10 ENCOUNTER — Ambulatory Visit (INDEPENDENT_AMBULATORY_CARE_PROVIDER_SITE_OTHER): Payer: Medicaid Other | Admitting: Clinical

## 2017-05-10 DIAGNOSIS — F203 Undifferentiated schizophrenia: Secondary | ICD-10-CM

## 2017-05-10 DIAGNOSIS — F4310 Post-traumatic stress disorder, unspecified: Secondary | ICD-10-CM
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
## 2017-05-16 ENCOUNTER — Ambulatory Visit (HOSPITAL_COMMUNITY): Payer: Self-pay | Admitting: Clinical

## 2017-05-26 ENCOUNTER — Ambulatory Visit (INDEPENDENT_AMBULATORY_CARE_PROVIDER_SITE_OTHER): Payer: Medicaid Other | Admitting: Psychiatry

## 2017-05-26 ENCOUNTER — Encounter (HOSPITAL_COMMUNITY): Payer: Self-pay | Admitting: Psychiatry

## 2017-05-26 DIAGNOSIS — M549 Dorsalgia, unspecified: Secondary | ICD-10-CM

## 2017-05-26 DIAGNOSIS — G4700 Insomnia, unspecified: Secondary | ICD-10-CM

## 2017-05-26 DIAGNOSIS — F332 Major depressive disorder, recurrent severe without psychotic features: Secondary | ICD-10-CM

## 2017-05-26 DIAGNOSIS — Z811 Family history of alcohol abuse and dependence: Secondary | ICD-10-CM | POA: Diagnosis not present

## 2017-05-26 DIAGNOSIS — F411 Generalized anxiety disorder: Secondary | ICD-10-CM | POA: Diagnosis not present

## 2017-05-26 DIAGNOSIS — R51 Headache: Secondary | ICD-10-CM | POA: Diagnosis not present

## 2017-05-26 DIAGNOSIS — Z810 Family history of intellectual disabilities: Secondary | ICD-10-CM | POA: Diagnosis not present

## 2017-05-26 DIAGNOSIS — F4310 Post-traumatic stress disorder, unspecified: Secondary | ICD-10-CM | POA: Diagnosis not present

## 2017-05-26 DIAGNOSIS — M2550 Pain in unspecified joint: Secondary | ICD-10-CM | POA: Diagnosis not present

## 2017-05-26 DIAGNOSIS — Z818 Family history of other mental and behavioral disorders: Secondary | ICD-10-CM

## 2017-05-26 DIAGNOSIS — M791 Myalgia: Secondary | ICD-10-CM

## 2017-05-26 MED ORDER — BENZTROPINE MESYLATE 0.5 MG PO TABS: 1.0000 mg | ORAL_TABLET | Freq: Two times a day (BID) | ORAL | 1 refills | Status: DC

## 2017-05-26 MED ORDER — QUETIAPINE FUMARATE 100 MG PO TABS: 100.0000 mg | ORAL_TABLET | Freq: Every day | ORAL | 2 refills | Status: DC

## 2017-05-26 MED ORDER — SERTRALINE HCL 100 MG PO TABS: 200.0000 mg | ORAL_TABLET | Freq: Every day | ORAL | 2 refills | Status: DC
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
## 2017-06-06 IMAGING — CR DG WRIST COMPLETE 3+V*L*
4 series · 4 of 4 positions shown · non-contrast
Comparison: None.

CLINICAL DATA: Fall.

EXAM:
LEFT WRIST - COMPLETE 3+ VIEW

[PA (1 of 3)]
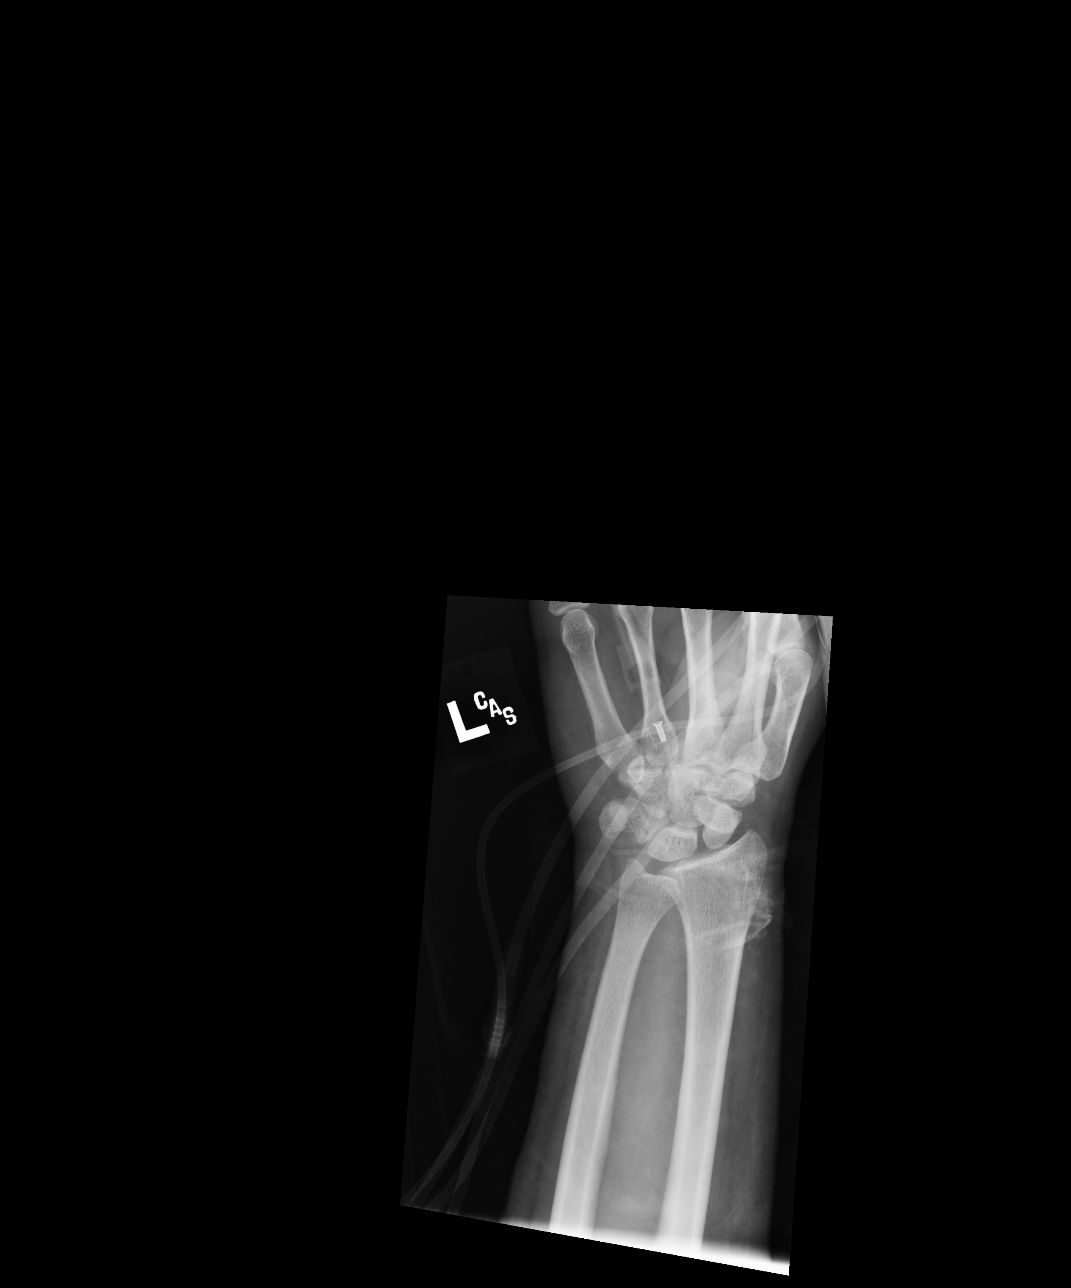

[PA (2 of 3)]
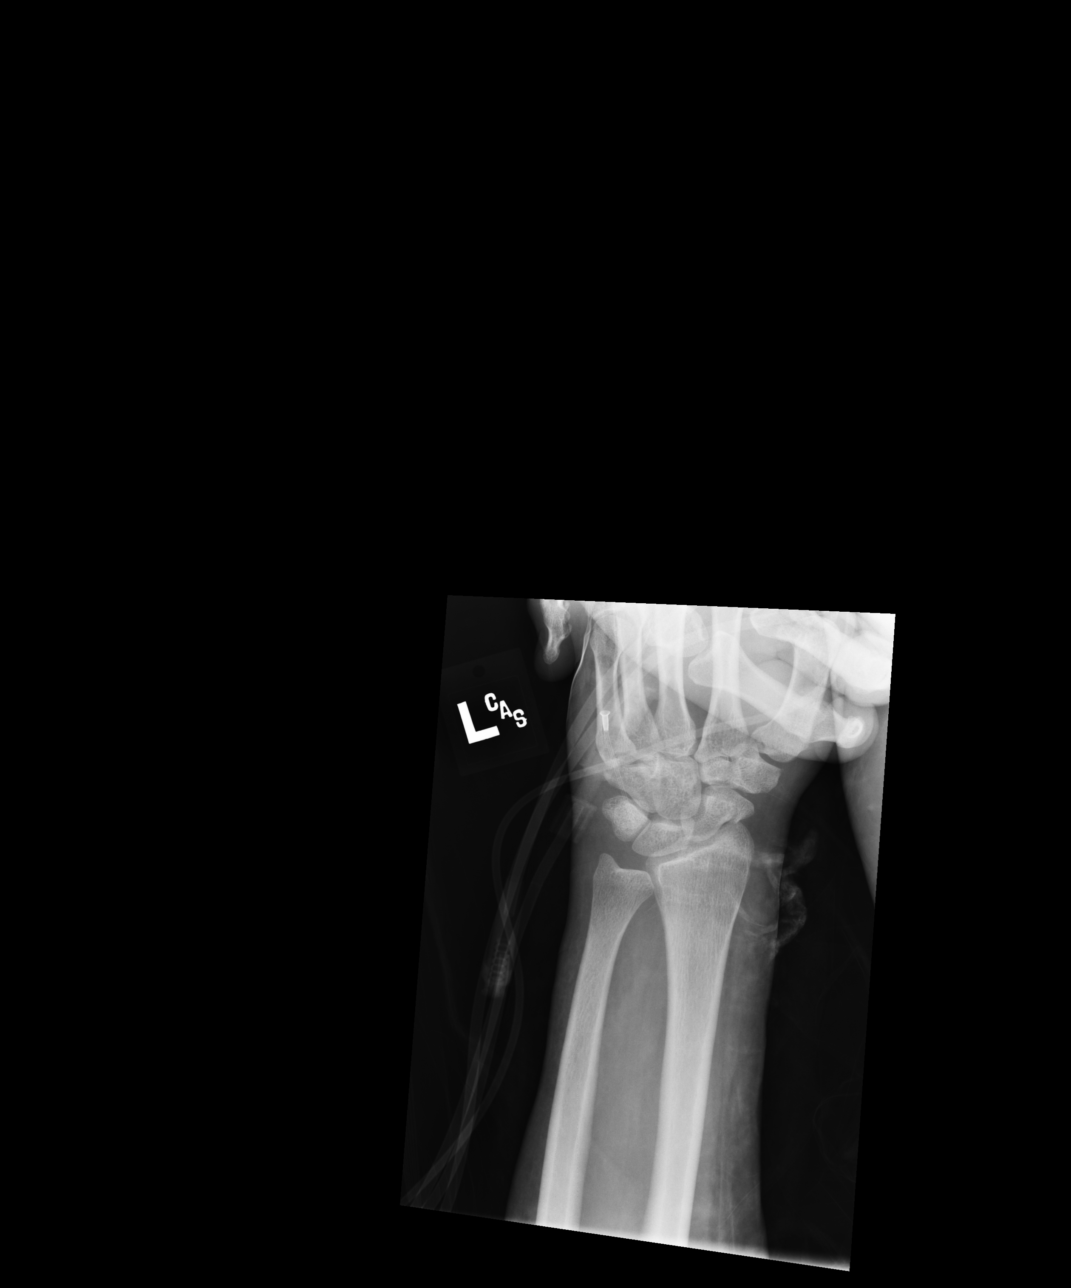

[PA (3 of 3)]
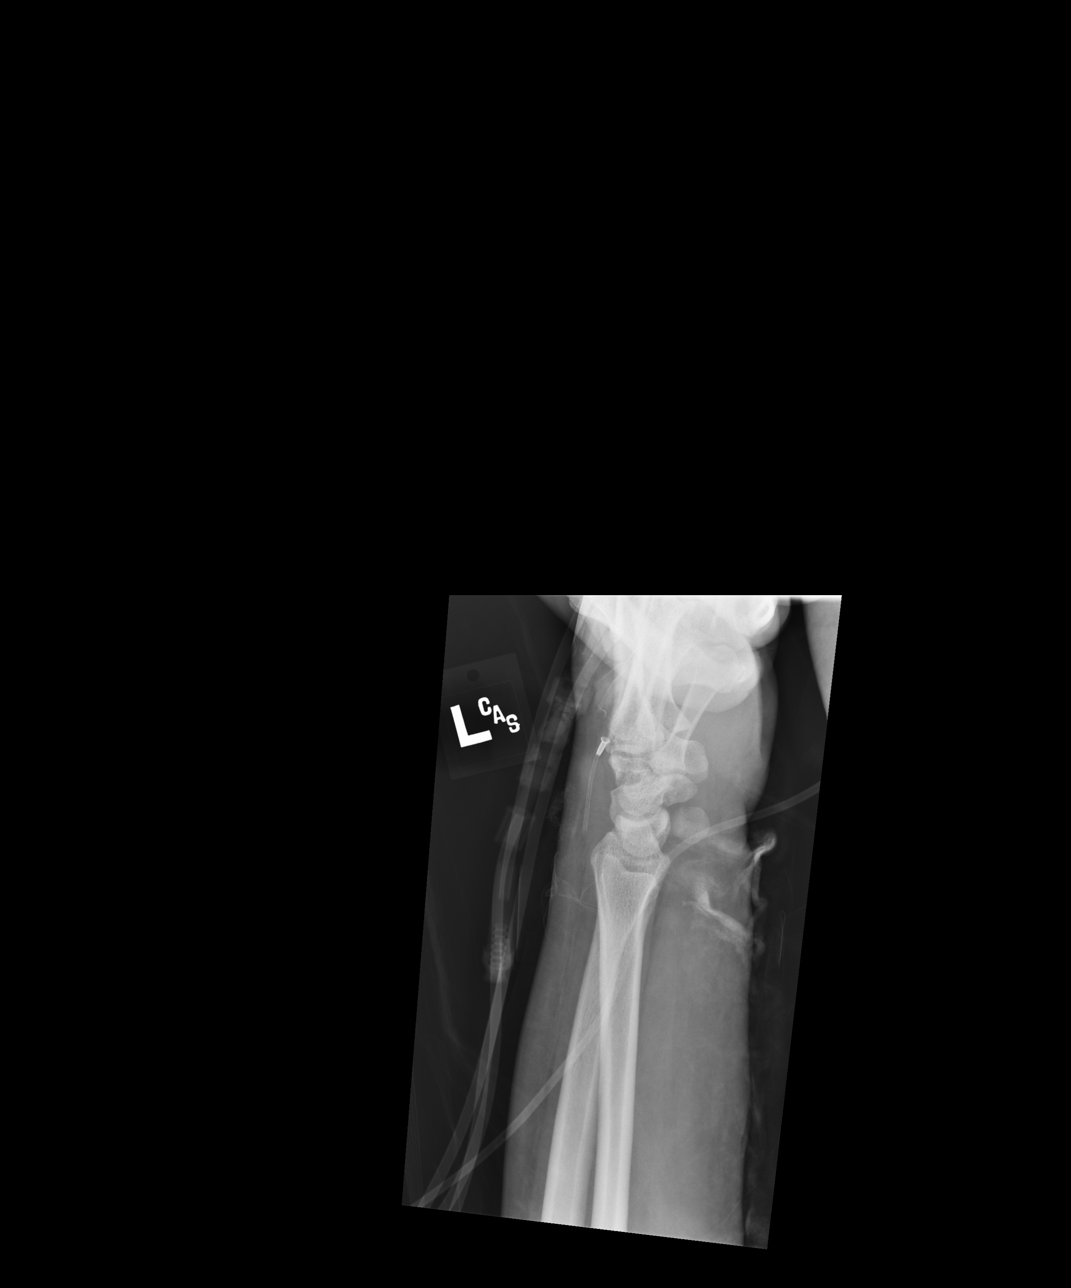

[lateral]
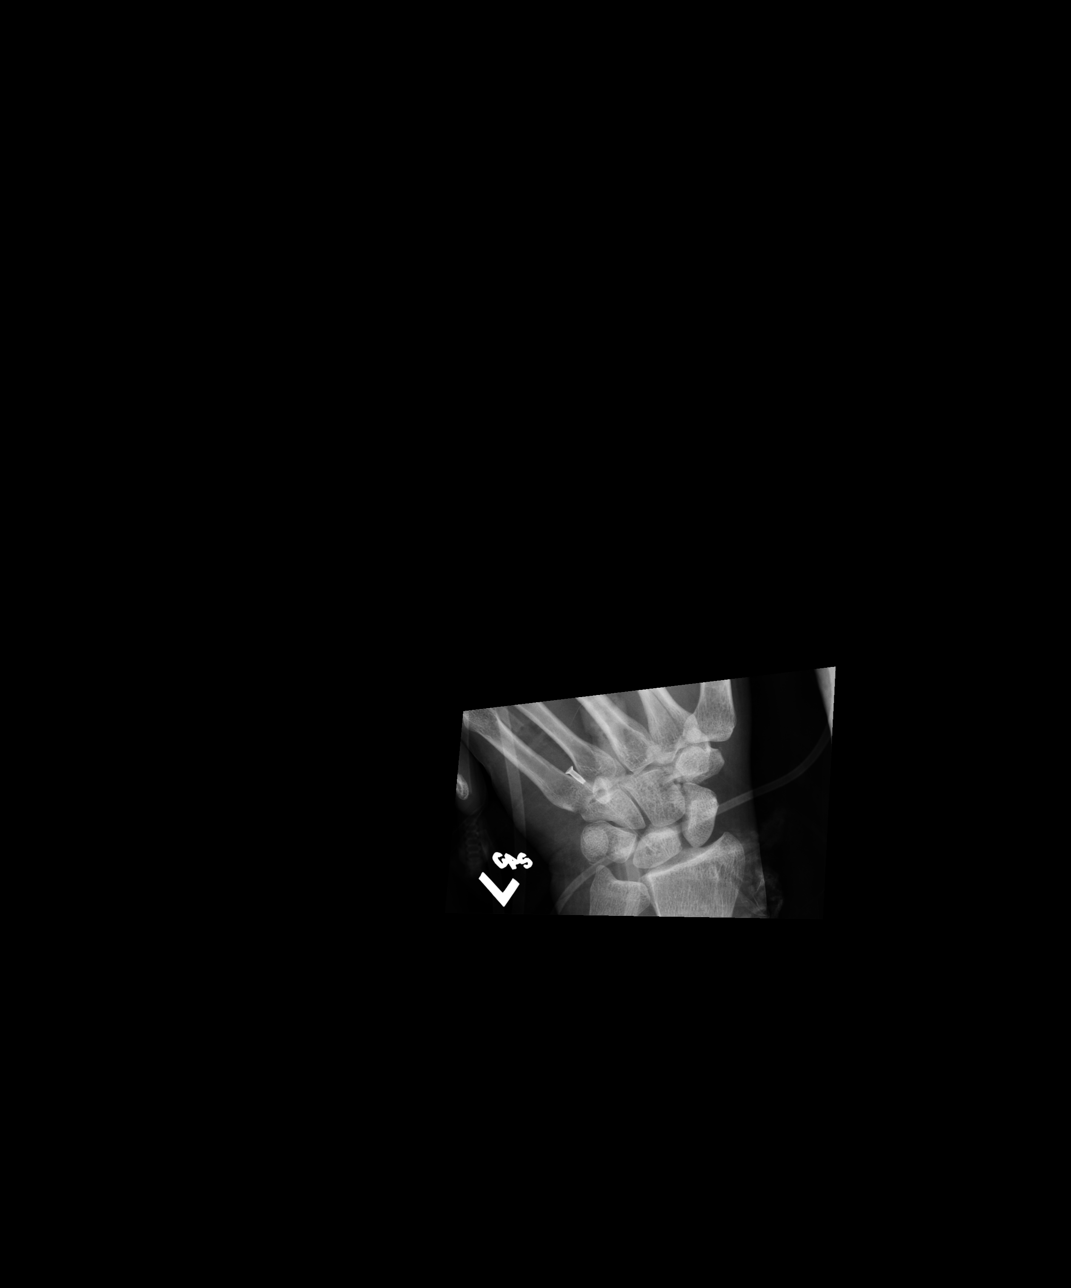

[4 of 4 positions shown; findings below may reference images not displayed]

FINDINGS: Exam detail is diminished due to overlying support material. There
is a fracture deformity which extends through the base second
metacarpal bone.
IMPRESSION: 1. There is a fracture involving the base of the second metacarpal
bone. Recommend repeat imaging following removal overlying tubes and
lines.
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
## 2017-07-14 DIAGNOSIS — Z0271 Encounter for disability determination: Secondary | ICD-10-CM

## 2017-07-18 ENCOUNTER — Encounter: Payer: Medicaid Other | Admitting: Physical Medicine & Rehabilitation

## 2017-07-28 ENCOUNTER — Encounter (HOSPITAL_COMMUNITY): Payer: Self-pay | Admitting: Psychiatry

## 2017-07-28 ENCOUNTER — Ambulatory Visit (INDEPENDENT_AMBULATORY_CARE_PROVIDER_SITE_OTHER): Payer: Medicaid Other | Admitting: Psychiatry

## 2017-07-28 ENCOUNTER — Ambulatory Visit (HOSPITAL_COMMUNITY)
Admission: RE | Admit: 2017-07-28 | Discharge: 2017-07-28 | Disposition: A | Discharge status: Home / Self Care | Payer: Medicaid Other | Source: Ambulatory Visit | Attending: Psychiatry | Admitting: Psychiatry

## 2017-07-28 VITALS — BP 122/82 | HR 71 | Ht 72.0 in | Wt 278.2 lb

## 2017-07-28 DIAGNOSIS — Z79899 Other long term (current) drug therapy: Secondary | ICD-10-CM | POA: Diagnosis not present

## 2017-07-28 DIAGNOSIS — G4700 Insomnia, unspecified: Secondary | ICD-10-CM

## 2017-07-28 DIAGNOSIS — Z811 Family history of alcohol abuse and dependence: Secondary | ICD-10-CM

## 2017-07-28 DIAGNOSIS — M2550 Pain in unspecified joint: Secondary | ICD-10-CM

## 2017-07-28 DIAGNOSIS — Z818 Family history of other mental and behavioral disorders: Secondary | ICD-10-CM | POA: Diagnosis not present

## 2017-07-28 DIAGNOSIS — F411 Generalized anxiety disorder: Secondary | ICD-10-CM

## 2017-07-28 DIAGNOSIS — Z736 Limitation of activities due to disability: Secondary | ICD-10-CM | POA: Diagnosis not present

## 2017-07-28 DIAGNOSIS — M791 Myalgia: Secondary | ICD-10-CM

## 2017-07-28 DIAGNOSIS — Z810 Family history of intellectual disabilities: Secondary | ICD-10-CM

## 2017-07-28 DIAGNOSIS — R443 Hallucinations, unspecified: Secondary | ICD-10-CM

## 2017-07-28 DIAGNOSIS — F332 Major depressive disorder, recurrent severe without psychotic features: Secondary | ICD-10-CM

## 2017-07-28 DIAGNOSIS — M549 Dorsalgia, unspecified: Secondary | ICD-10-CM | POA: Diagnosis not present

## 2017-07-28 DIAGNOSIS — Z5181 Encounter for therapeutic drug level monitoring: Secondary | ICD-10-CM | POA: Diagnosis not present

## 2017-07-28 DIAGNOSIS — F4310 Post-traumatic stress disorder, unspecified: Secondary | ICD-10-CM

## 2017-07-28 DIAGNOSIS — R450 Nervousness: Secondary | ICD-10-CM

## 2017-07-28 MED ORDER — SERTRALINE HCL 100 MG PO TABS: 200.0000 mg | ORAL_TABLET | Freq: Every day | ORAL | 2 refills | Status: AC

## 2017-07-28 MED ORDER — BENZTROPINE MESYLATE 0.5 MG PO TABS: 1.0000 mg | ORAL_TABLET | Freq: Two times a day (BID) | ORAL | 2 refills | Status: DC

## 2017-07-28 MED ORDER — QUETIAPINE FUMARATE 100 MG PO TABS: 100.0000 mg | ORAL_TABLET | Freq: Every day | ORAL | 2 refills | Status: AC
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
## 2017-07-29 ENCOUNTER — Ambulatory Visit (HOSPITAL_COMMUNITY): Payer: Medicaid Other | Admitting: *Deleted

## 2017-07-29 VITALS — BP 136/70 | HR 80 | Ht 72.0 in | Wt 278.0 lb

## 2017-07-29 DIAGNOSIS — F323 Major depressive disorder, single episode, severe with psychotic features: Secondary | ICD-10-CM

## 2017-07-29 NOTE — Progress Notes (Signed)
Pt presents for f/u labs per Dr. Michae Kava. She appears blunted but does brighten at intervals. Calm and cooperatuive with assessment. Denies psychotic symptoms. offers no questions or concerns. Tolerated well. Will follow up as indicated.

## 2017-08-02 ENCOUNTER — Encounter: Payer: Medicaid Other | Attending: Physical Medicine & Rehabilitation | Admitting: Physical Medicine & Rehabilitation

## 2017-08-02 ENCOUNTER — Encounter: Payer: Self-pay | Admitting: Physical Medicine & Rehabilitation

## 2017-08-02 VITALS — BP 113/80 | HR 77

## 2017-08-02 DIAGNOSIS — I69319 Unspecified symptoms and signs involving cognitive functions following cerebral infarction: Secondary | ICD-10-CM | POA: Insufficient documentation

## 2017-08-02 DIAGNOSIS — Z79899 Other long term (current) drug therapy: Secondary | ICD-10-CM | POA: Insufficient documentation

## 2017-08-02 DIAGNOSIS — R292 Abnormal reflex: Secondary | ICD-10-CM | POA: Diagnosis not present

## 2017-08-02 DIAGNOSIS — S069X3S Unspecified intracranial injury with loss of consciousness of 1 hour to 5 hours 59 minutes, sequela: Secondary | ICD-10-CM

## 2017-08-02 DIAGNOSIS — Z8781 Personal history of (healed) traumatic fracture: Secondary | ICD-10-CM

## 2017-08-02 DIAGNOSIS — G8194 Hemiplegia, unspecified affecting left nondominant side: Secondary | ICD-10-CM

## 2017-08-02 DIAGNOSIS — R569 Unspecified convulsions: Secondary | ICD-10-CM | POA: Insufficient documentation

## 2017-08-02 DIAGNOSIS — Z981 Arthrodesis status: Secondary | ICD-10-CM | POA: Insufficient documentation

## 2017-08-02 DIAGNOSIS — R531 Weakness: Secondary | ICD-10-CM | POA: Insufficient documentation

## 2017-08-02 DIAGNOSIS — I69354 Hemiplegia and hemiparesis following cerebral infarction affecting left non-dominant side: Secondary | ICD-10-CM | POA: Insufficient documentation

## 2017-08-02 DIAGNOSIS — M545 Low back pain, unspecified: Secondary | ICD-10-CM

## 2017-08-02 DIAGNOSIS — Z915 Personal history of self-harm: Secondary | ICD-10-CM | POA: Insufficient documentation

## 2017-08-02 DIAGNOSIS — R200 Anesthesia of skin: Secondary | ICD-10-CM | POA: Insufficient documentation

## 2017-08-02 DIAGNOSIS — F419 Anxiety disorder, unspecified: Secondary | ICD-10-CM | POA: Diagnosis not present

## 2017-08-02 DIAGNOSIS — Z818 Family history of other mental and behavioral disorders: Secondary | ICD-10-CM | POA: Insufficient documentation

## 2017-08-02 DIAGNOSIS — R252 Cramp and spasm: Secondary | ICD-10-CM | POA: Insufficient documentation

## 2017-08-02 DIAGNOSIS — Z8249 Family history of ischemic heart disease and other diseases of the circulatory system: Secondary | ICD-10-CM | POA: Insufficient documentation

## 2017-08-02 DIAGNOSIS — F329 Major depressive disorder, single episode, unspecified: Secondary | ICD-10-CM | POA: Diagnosis not present

## 2017-08-02 DIAGNOSIS — F909 Attention-deficit hyperactivity disorder, unspecified type: Secondary | ICD-10-CM | POA: Insufficient documentation

## 2017-08-02 LAB — CBC WITH DIFFERENTIAL/PLATELET
BASOS ABS: 0 10*3/uL (ref 0.0–0.2)
BASOS: 0 %
EOS (ABSOLUTE): 0.3 10*3/uL (ref 0.0–0.4)
Eos: 3 %
HEMOGLOBIN: 13.6 g/dL (ref 11.1–15.9)
Hematocrit: 43.5 % (ref 34.0–46.6)
Immature Grans (Abs): 0.1 10*3/uL (ref 0.0–0.1)
Immature Granulocytes: 1 %
LYMPHS ABS: 2.1 10*3/uL (ref 0.7–3.1)
Lymphs: 22 %
MCH: 26.6 pg (ref 26.6–33.0)
MCHC: 31.3 g/dL — ABNORMAL LOW (ref 31.5–35.7)
MCV: 85 fL (ref 79–97)
MONOCYTES: 5 %
Monocytes Absolute: 0.4 10*3/uL (ref 0.1–0.9)
NEUTROS ABS: 6.5 10*3/uL (ref 1.4–7.0)
Neutrophils: 69 %
PLATELETS: 311 10*3/uL (ref 150–379)
RBC: 5.11 x10E6/uL (ref 3.77–5.28)
RDW: 14.7 % (ref 12.3–15.4)
WBC: 9.3 10*3/uL (ref 3.4–10.8)

## 2017-08-02 LAB — PROLACTIN: PROLACTIN: 13 ng/mL (ref 4.8–23.3)

## 2017-08-02 LAB — COMPREHENSIVE METABOLIC PANEL
ALBUMIN: 4.5 g/dL (ref 3.5–5.5)
ALK PHOS: 62 IU/L (ref 39–117)
ALT: 9 IU/L (ref 0–32)
AST: 16 IU/L (ref 0–40)
Albumin/Globulin Ratio: 1.7 (ref 1.2–2.2)
BILIRUBIN TOTAL: 0.4 mg/dL (ref 0.0–1.2)
BUN / CREAT RATIO: 14 (ref 9–23)
BUN: 11 mg/dL (ref 6–20)
CO2: 17 mmol/L — ABNORMAL LOW (ref 20–29)
Calcium: 9.1 mg/dL (ref 8.7–10.2)
Chloride: 105 mmol/L (ref 96–106)
Creatinine, Ser: 0.76 mg/dL (ref 0.57–1.00)
GFR calc Af Amer: 119 mL/min/{1.73_m2} (ref >59)
GFR calc non Af Amer: 103 mL/min/{1.73_m2} (ref >59)
GLOBULIN, TOTAL: 2.6 g/dL (ref 1.5–4.5)
GLUCOSE: 110 mg/dL — AB (ref 65–99)
Potassium: 4.3 mmol/L (ref 3.5–5.2)
SODIUM: 140 mmol/L (ref 134–144)
Total Protein: 7.1 g/dL (ref 6.0–8.5)

## 2017-08-02 LAB — TSH: TSH: 1.07 u[IU]/mL (ref 0.450–4.500)

## 2017-08-02 NOTE — Patient Instructions (Signed)
USING VOLTAREN OR TYLENOL BEFORE AND OR AFTER YOUR WORK OUT   FOCUS ON WALKING MECHANICS   LOOKING AT OTHER LOWER IMPACT OPTIONS FOR EXERCISE----BIKE, WATER, ELIPTICAL    PLEASE FEEL FREE TO CALL OUR OFFICE WITH ANY PROBLEMS OR QUESTIONS [phone])

## 2017-08-02 NOTE — Progress Notes (Signed)
Subjective:    Patient ID: [name] Bell, female    DOB: 02/28/1981, 36 y.o.   MRN: [id number]  HPI   Retia is here in follow up of her TBI/SCI and associated deficits. She has been trying to exercise more but is noticing cramping in her right foot/toes as well as pain in her back. She also notices that the left foot goes numb with ambulation more than 10-15 minutes. She is wearing a foot up brace which helps with her foot clearance, but she forgot to put on today.   She also reports pain in her low back and pelvis when she is washing dishes or her hair in the shower when she is bending over slightly. Pain improves with lying down or taking rest breaks. She tries to sleep on her side at night which helps the discomfort. She is not using voltaren regularly.   She states that her mood has been stable. She follows up with [facility] routinely.    Pain Inventory Average Pain 3 Pain Right Now 3 My pain is intermittent, dull and aching  In the last 24 hours, has pain interfered with the following? General activity 2 Relation with others 2 Enjoyment of life 2 What TIME of day is your pain at its worst? night Sleep (in general) Good  Pain is worse with: bending, sitting, standing, unsure and some activites Pain improves with: rest, therapy/exercise and medication Relief from Meds: 3  Mobility walk without assistance walk with assistance ability to climb steps?  yes do you drive?  no  Function disabled: date disabled 2017 I need assistance with the following:  meal prep, household duties and shopping  Neuro/Psych confusion depression anxiety  Prior Studies Any changes since last visit?  no  Physicians involved in your care Any changes since last visit?  no   Family History  Problem Relation Age of Onset  . Hypertension Father   . Mental illness Mother   . Dementia Mother   . Depression Mother   . OCD Mother   . Paranoid behavior Mother   . Physical abuse Mother   .  Sexual abuse Mother   . Stroke Maternal Aunt   . Alcohol abuse Maternal Aunt   . Hypertension Sister   . Anxiety disorder Sister   . Depression Sister   . Physical abuse Sister   . Sexual abuse Sister   . Fibroids Sister   . Alcohol abuse Paternal Aunt   . Alcohol abuse Maternal Uncle   . Alcohol abuse Paternal Uncle   . Depression Cousin    Social History   Social History  . Marital status: Single    Spouse name: N/A  . Number of children: N/A  . Years of education: N/A   Social History Main Topics  . Smoking status: Never Smoker  . Smokeless tobacco: Never Used  . Alcohol use No     Comment: unknown  . Drug use: No  . Sexual activity: No   Other Topics Concern  . 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 APPLICATION OF WOUND VAC  02/09/2016   Procedure: APPLICATION OF WOUND VAC;  Surgeon: Jimmye Norman, MD;  Location: [facility] OR;  Service: General;;  . APPLICATION OF WOUND VAC N/A 02/16/2016   Procedure: RE-APPLICATION OF WOUND VAC;  Surgeon: [name], MD;  Location: [facility] OR;  Service: General;  Laterality: N/A;  . BOWEL RESECTION  02/09/2016   Procedure: SMALL BOWEL RESECTION, MESENTERIC REPAIR;  Surgeon: Jimmye Norman, MD;  Location: [facility] OR;  Service: General;;  . CHEST TUBE INSERTION Right 02/11/2016   Procedure: CHEST TUBE INSERTION;  Surgeon: Jimmye Norman, MD;  Location: [facility] OR;  Service: General;  Laterality: Right;  . CHEST TUBE INSERTION Right 02/26/2016   Procedure: CHEST TUBE INSERTION;  Surgeon: Kerin Perna, MD;  Location: [facility] OR;   Service: Thoracic;  Laterality: Right;  . CHOLECYSTECTOMY  02/09/2016   Procedure: CHOLECYSTECTOMY;  Surgeon: Jimmye Norman, MD;  Location: [facility] OR;  Service: General;;  . ESOPHAGOGASTRODUODENOSCOPY N/A 02/10/2016   Procedure: ESOPHAGOGASTRODUODENOSCOPY (EGD);  Surgeon: Sherrilyn Rist, MD;  Location: [facility] ENDOSCOPY;  Service: Endoscopy;  Laterality: N/A;  . EXTERNAL FIXATION PELVIS  02/09/2016   Procedure: EXTERNAL FIXATION PELVIS;  Surgeon: Myrene Galas, MD;  Location: [facility] OR;  Service: Orthopedics;;  . HEMATOMA EVACUATION Right 05/14/2016   Procedure: EVACUATION HEMATOMA;  Surgeon: Alleen Borne, MD;  Location: [facility] OR;  Service: Thoracic;  Laterality: Right;  Evacuation of Hematoma Right Chest  . LACERATION REPAIR  02/09/2016   Procedure: REPAIR LIVER LACERATION;  Surgeon: Jimmye Norman, MD;  Location[facility] OR;  Service: General;;  . LAPAROTOMY N/A 02/10/2016   Procedure: EXPLORATORY LAPAROTOMY, removal of packs,  cauterization of liver, repacking of liver, and open abdomen vac application;  Surgeon: [name], MD;  Location: [facility] OR;  Service: General;  Laterality: N/A;  . LAPAROTOMY N/A 02/11/2016   Procedure: EXPLORATORY LAPAROTOMY VAC CHANGE ;  Surgeon: Jimmye Norman, MD;  Location: [facility] OR;  Service: General;  Laterality: N/A;  . LAPAROTOMY N/A 02/13/2016   Procedure: EXPLORATORY LAPAROTOMY, REMOVAL OF PACKS, ABDOMINAL VAC DRESSING CHANGE;  Surgeon: [name], MD;  Location: [facility] OR;  Service: General;  Laterality: N/A;  . LAPAROTOMY N/A 02/18/2016   Procedure: EXPLORATORY LAPAROTOMY, PLACEMENT OF ABRA ABDOMINAL WALL CLOSURE SET;  Surgeon: Jimmye Norman, MD;  Location: [facility] OR;  Service: General;  Laterality: N/A;  . LAPAROTOMY N/A 02/09/2016   Procedure: EXPLORATORY LAPAROTOMY;  Surgeon: Jimmye Norman, MD;  Location: [facility] OR;  Service: General;  Laterality: N/A;  . LAPAROTOMY N/A 02/16/2016   Procedure: EXPLORATORY LAPAROTOMY, ABDOMINAL WASH OUT;  Surgeon: [name], MD;  Location: [facility] OR;  Service: General;   Laterality: N/A;  . PERCUTANEOUS TRACHEOSTOMY N/A 03/01/2016   Procedure: PERCUTANEOUS TRACHEOSTOMY;  Surgeon: Jimmye Norman, MD;  Location: [facility] OR;  Service: General;  Laterality: N/A;  . SACRO-ILIAC PINNING Right 02/16/2016   Procedure: Loyal Gambler;  Surgeon: Myrene Galas, MD;  Location: [facility] OR;  Service: Orthopedics;  Laterality: Right;  . TEE WITHOUT CARDIOVERSION N/A 05/14/2016   Procedure: TRANSESOPHAGEAL ECHOCARDIOGRAM (TEE);  Surgeon: Alleen Borne, MD;  Location: [facility] OR;  Service: Thoracic;  Laterality: N/A;  . THORACOTOMY/LOBECTOMY Right 04/26/2016   Procedure: Right THORACOTOMY AND DRAINAGE OF EMPYEMA;  Surgeon: Alleen Borne, MD;  Location: [facility] OR;  Service: Thoracic;  Laterality: Right;  Marland Kitchen VIDEO ASSISTED THORACOSCOPY (VATS)/THOROCOTOMY Right 05/14/2016   Procedure: RIGHT VIDEO  ASSISTED THORACOSCOPY (VATS),DRAINAGE OF EMPYEMA;  Surgeon: Alleen Borne, MD;  Location: [facility] OR;  Service: Thoracic;  Laterality: Right;  . WOUND DEBRIDEMENT N/A 03/01/2016   Procedure: ABDOMINAL WOUND CLOSURE;  Surgeon: Jimmye Norman, MD;  Location: [facility] OR;  Service: General;  Laterality: N/A;   Past Medical History:  Diagnosis Date  . ADD (attention deficit disorder)    has been on medication  . ADHD (attention deficit hyperactivity disorder)   . Anemia   . Anxiety   . Depression   . H/O suicide attempt    at age 19.   Marland Kitchen PTSD (post-traumatic stress disorder)   . Seizures (HCC)   . Stroke [facility])    There were no vitals taken for this visit.  Opioid Risk Score:   Fall Risk Score:  `1  Depression screen PHQ 2/9  Depression screen [facility] 2/9 01/12/2017 09/24/2016 09/14/2016 07/14/2016 07/09/2016  Decreased Interest 3 0 0 0 0  Down, Depressed, Hopeless 3 2 0 0 0  PHQ - 2 Score 6 2 0 0 0  Altered sleeping - 3 - 0 -  Tired, decreased energy - 0 - 1 -  Change in appetite - 0 - 0 -  Feeling bad or failure about yourself  - 0 - 0 -  Trouble concentrating - 0 - 2 -  Moving slowly or fidgety/restless - 0 - 0 -    Suicidal thoughts - 0 - 0 -  PHQ-9 Score - 5 - 3 -  Difficult doing work/chores - - - Not difficult at all -     Review of Systems  Constitutional: Negative.   HENT: Negative.   Eyes: Negative.   Respiratory: Negative.   Cardiovascular: Negative.   Gastrointestinal: Negative.   Endocrine: Negative.   Genitourinary: Negative.   Musculoskeletal: Negative.   Skin: Negative.   Allergic/Immunologic: Negative.   Neurological: Negative.   Hematological: Negative.   Psychiatric/Behavioral: Negative.   All other systems reviewed and are negative.      Objective:   Physical Exam  Constitutional: She appears well-developedand well-nourished.  HENT: Normocephalicand atraumatic.  Eyes: Conjunctivaeand EOM are normal.  Cardiovascular: RRR.  Respiratory: CTA B  GI: Soft. Bowel sounds are normal. She exhibits no distension. There is no tenderness.   Musculoskeletal: Mild SI area tenderness to palpation Neurological: She is alertand oriented.  Speech clear. Able to follow commands without difficulty.  Sensation intact in the left lower extremity   Motor strength: 4+/5 in the right deltoid, biceps, triceps, grip, RLE: 5/5 hip flexor, knee extensor, ankle dorsiflexor. LUE: 4+/5 left deltoid, biceps, triceps, grip LLE; 4/5, hip flexion, knee extension, 4/5 at the ankle dorsiflexor and  4-/5plantar flexor.had foot up brace on shoe Moving  better. Continues to step down onto the left leg with pelvic dip. Improves with cueing. TTP along bilateral PSIS and lower lumbar paraspinals. When she flexes she twists to the left slightly. Mild pain with flexion and extension. Greater trochs tender to palp Skin: Skin is warmand dry. No rashnoted. No erythema.  Psychiatric: pleasant, up beat.     Assessment & Plan:  1. 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                        -she will work herself on gait mechanics at home.  2.. Mood:  .   -continue outpt psychology/psychiatric follow up at [facility]  3. TBI/R-MCA infarct with DAI with hemorrhage:  4. New onset seizures: continue keppra.   Follow up in 4months. 15 minutes of face to face patient care time were spent during this visit. All questions were encouraged and answered.

## 2017-08-15 ENCOUNTER — Encounter: Payer: Medicaid Other | Admitting: Physical Medicine & Rehabilitation

## 2017-08-22 ENCOUNTER — Encounter: Payer: Self-pay | Admitting: Physical Therapy

## 2017-08-22 ENCOUNTER — Ambulatory Visit: Payer: Medicaid Other | Attending: Physical Medicine & Rehabilitation | Admitting: Physical Therapy

## 2017-08-22 DIAGNOSIS — M79605 Pain in left leg: Secondary | ICD-10-CM | POA: Insufficient documentation

## 2017-08-22 DIAGNOSIS — M6281 Muscle weakness (generalized): Secondary | ICD-10-CM | POA: Diagnosis present

## 2017-08-22 DIAGNOSIS — M21372 Foot drop, left foot: Secondary | ICD-10-CM | POA: Diagnosis present

## 2017-08-22 DIAGNOSIS — R2689 Other abnormalities of gait and mobility: Secondary | ICD-10-CM

## 2017-08-22 NOTE — Therapy (Signed)
[facility] [address] [place], [place], [address] Phone: [phone]   Fax:  [phone]  Physical Therapy Evaluation  Patient Details  Name: [name] Bell MRN: [id number] Date of Birth: 08-13-1981 Referring Provider: Faith Rogue MD  Encounter Date: 08/22/2017      PT End of Session - 08/22/17 1303    Visit Number 1   Number of Visits 4  eval plus 3 visits (initial certification; plan to request 3 additional visits for total of 6 visits)   Date for PT Re-Evaluation --  TBA based on MCD authorization   Authorization Type MCD   Authorization Time Period TBD by MCD   PT Start Time 0930   PT Stop Time 1017   PT Time Calculation (min) 47 min   Activity Tolerance Patient tolerated treatment well   Behavior During Therapy [facility] for tasks assessed/performed      Past Medical History:  Diagnosis Date  . ADD (attention deficit disorder)    has been on medication  . ADHD (attention deficit hyperactivity disorder)   . Anemia   . Anxiety   . Depression   . H/O suicide attempt    at age 5.   Marland Kitchen PTSD (post-traumatic stress disorder)   . Seizures (HCC)   . Stroke [facility])     Past Surgical History:  Procedure Laterality Date  . APPLICATION OF WOUND VAC  02/09/2016   Procedure: APPLICATION OF WOUND VAC;  Surgeon: Jimmye Norman, MD;  Location: [facility] OR;  Service: General;;  . APPLICATION OF WOUND VAC N/A 02/16/2016   Procedure: RE-APPLICATION OF WOUND VAC;  Surgeon: [name], MD;  Location: [facility] OR;  Service: General;  Laterality: N/A;  . BOWEL RESECTION  02/09/2016   Procedure: SMALL BOWEL RESECTION, MESENTERIC REPAIR;  Surgeon: Jimmye Norman, MD;  Location: [facility] OR;  Service: General;;  . CHEST TUBE INSERTION Right 02/11/2016   Procedure: CHEST TUBE INSERTION;  Surgeon: Jimmye Norman, MD;  Location: [facility] OR;  Service: General;  Laterality: Right;  . CHEST TUBE INSERTION Right 02/26/2016   Procedure: CHEST TUBE INSERTION;   Surgeon: Kerin Perna, MD;  Location: [facility] OR;  Service: Thoracic;  Laterality: Right;  . CHOLECYSTECTOMY  02/09/2016   Procedure: CHOLECYSTECTOMY;  Surgeon: Jimmye Norman, MD;  Location: [facility] OR;  Service: General;;  . ESOPHAGOGASTRODUODENOSCOPY N/A 02/10/2016   Procedure: ESOPHAGOGASTRODUODENOSCOPY (EGD);  Surgeon: Sherrilyn Rist, MD;  Location: [facility] ENDOSCOPY;  Service: Endoscopy;  Laterality: N/A;  . EXTERNAL FIXATION PELVIS  02/09/2016   Procedure: EXTERNAL FIXATION PELVIS;  Surgeon: Myrene Galas, MD;  Location: [facility] OR;  Service: Orthopedics;;  . HEMATOMA EVACUATION Right 05/14/2016   Procedure: EVACUATION HEMATOMA;  Surgeon: Alleen Borne, MD;  Location: [facility] OR;  Service: Thoracic;  Laterality: Right;  Evacuation of Hematoma Right Chest  . LACERATION REPAIR  02/09/2016   Procedure: REPAIR LIVER LACERATION;  Surgeon: Jimmye Norman, MD;  Location: [facility] OR;  Service: General;;  . LAPAROTOMY N/A 02/10/2016   Procedure: EXPLORATORY LAPAROTOMY, removal of packs,  cauterization of liver, repacking of liver, and open abdomen vac application;  Surgeon: [name], MD;  Location: [facility] OR;  Service: General;  Laterality: N/A;  . LAPAROTOMY N/A 02/11/2016   Procedure: EXPLORATORY LAPAROTOMY VAC CHANGE ;  Surgeon: Jimmye Norman, MD;  Location: [facility] OR;  Service: General;  Laterality: N/A;  . LAPAROTOMY N/A 02/13/2016   Procedure: EXPLORATORY LAPAROTOMY, REMOVAL OF PACKS, ABDOMINAL VAC DRESSING CHANGE;  Surgeon: [name], MD;  Location: [facility]  OR;  Service: General;  Laterality: N/A;  . LAPAROTOMY N/A 02/18/2016   Procedure: EXPLORATORY LAPAROTOMY, PLACEMENT OF ABRA ABDOMINAL WALL CLOSURE SET;  Surgeon: Jimmye Norman, MD;  Location: [facility] OR;  Service: General;  Laterality: N/A;  . LAPAROTOMY N/A 02/09/2016   Procedure: EXPLORATORY LAPAROTOMY;  Surgeon: Jimmye Norman, MD;  Location: [facility] OR;  Service: General;  Laterality: N/A;  . LAPAROTOMY N/A 02/16/2016   Procedure: EXPLORATORY LAPAROTOMY, ABDOMINAL WASH OUT;  Surgeon: [name], MD;  Location: [facility] OR;  Service: General;  Laterality: N/A;  . PERCUTANEOUS TRACHEOSTOMY N/A 03/01/2016   Procedure: PERCUTANEOUS TRACHEOSTOMY;  Surgeon: Jimmye Norman, MD;  Location: [facility] OR;  Service: General;  Laterality: N/A;  Loyal Gambler Right 02/16/2016   Procedure: Loyal Gambler;  Surgeon: Myrene Galas, MD;  Location: [facility] OR;  Service: Orthopedics;  Laterality: Right;  . TEE WITHOUT CARDIOVERSION N/A 05/14/2016   Procedure: TRANSESOPHAGEAL ECHOCARDIOGRAM (TEE);  Surgeon: Alleen Borne, MD;  Location: [facility] OR;  Service: Thoracic;  Laterality: N/A;  . THORACOTOMY/LOBECTOMY Right 04/26/2016   Procedure: Right THORACOTOMY AND DRAINAGE OF EMPYEMA;  Surgeon: Alleen Borne, MD;  Location: [facility] OR;  Service: Thoracic;  Laterality: Right;  Marland Kitchen VIDEO ASSISTED THORACOSCOPY (VATS)/THOROCOTOMY Right 05/14/2016   Procedure: RIGHT VIDEO ASSISTED THORACOSCOPY (VATS),DRAINAGE OF EMPYEMA;  Surgeon: Alleen Borne, MD;  Location: [facility] OR;  Service: Thoracic;  Laterality: Right;  . WOUND DEBRIDEMENT N/A 03/01/2016   Procedure: ABDOMINAL WOUND CLOSURE;  Surgeon: Jimmye Norman, MD;  Location: [facility] OR;  Service: General;  Laterality: N/A;    There were no vitals filed for this visit.       Subjective Assessment - 08/22/17 0933    Subjective Just began working out to lose weight. I notice that my left knee is leaning towards the right leg and I can't go above 2.2 mph because I feel pain (on treadmill). Can't walk through the [employer] without feeling that my leg will crumble.    Limitations Sitting;Walking;Standing   How long can you sit comfortably? 5 min   How long can you stand comfortably? 15 min    How long can you walk comfortably? 1/2 way through [employer]; treadmill 15 minutes   Patient Stated Goals Would like to get my leg straight ("my knee turns in and my foot turns out")   Currently in Pain? Yes   Pain Score 2    Pain Location Sacrum   Pain Descriptors / Indicators Aching   Pain Type  Chronic pain   Pain Onset More than a month ago   Pain Frequency Intermittent   Aggravating Factors  sitting, supine   Pain Relieving Factors stand up; reposition onto her side (not her back)   Effect of Pain on Daily Activities limits her walking   Multiple Pain Sites No            [facility] PT Assessment - 08/22/17 0936      Assessment   Medical Diagnosis gait disorder   Referring Provider Faith Rogue MD   Onset Date/Surgical Date --  02/19/16   Hand Dominance Right   Prior Therapy Finished up here November 2017     Precautions   Precautions None   Required Braces or Orthoses Other Brace/Splint   Other Brace/Splint foot up brace on LLE     Restrictions   Weight Bearing Restrictions No     Balance Screen   Has the patient fallen in the past 6 months No   Has the patient had a  decrease in activity level because of a fear of falling?  No   Is the patient reluctant to leave their home because of a fear of falling?  No     Home Environment   Living Environment Private residence   Living Arrangements Other relatives  Sister and 3 adult kids   Available Help at Discharge Family   Type of Home House   Home Access Level entry   Home Layout Two level;Able to live on main level with bedroom/bathroom;1/2 bath on main level  does bathe at sink   Alternate Level Stairs-Number of Steps flight   Home Equipment [place] - single point;Shower seat  AFO; foot-up brace; theraband   Additional Comments reports her home environment is too chaotic for her to do her exercises there; she goes to the gym each day to exercise     Prior Function   Level of Independence Independent with basic ADLs  does not use shower upstairs due to fatigue/pain w/ stairs   Vocation On disability     Cognition   Overall Cognitive Status History of cognitive impairments - at baseline     Observation/Other Assessments   Focus on Therapeutic Outcomes (FOTO)  FS 56 (risk adjusted 58)   Activities of Balance  Confidence Scale (ABC Scale)  77.5%   Lower Extremity Functional Scale  44.7     Sensation   Light Touch Appears Intact     Coordination   Gross Motor Movements are Fluid and Coordinated Yes   Fine Motor Movements are Fluid and Coordinated Not tested     Posture/Postural Control   Posture/Postural Control No significant limitations     Tone   Assessment Location Left Lower Extremity     ROM / Strength   AROM / PROM / Strength AROM;Strength     AROM   Overall AROM  Deficits   Overall AROM Comments WFL except Lt DF ~15 degrees     Strength   Overall Strength Deficits   Overall Strength Comments LLE hip flexion 4, knee extension 4, knee flexion 3+, DF 4     Transfers   Transfers Sit to Stand;Stand to Sit   Sit to Stand 7: Independent   Stand to Sit 7: Independent     Ambulation/Gait   Ambulation/Gait Yes   Ambulation/Gait Assistance 6: Modified independent (Device/Increase time)   Ambulation Distance (Feet) 60 Feet  70, 40, 60   Assistive device None   Gait Pattern Step-through pattern;Decreased step length - right;Decreased stance time - left;Decreased dorsiflexion - left;Decreased weight shift to left;Lateral trunk lean to right;Decreased trunk rotation;Wide base of support   Ambulation Surface Level;Indoor   Gait velocity 32.8/11.03=2.97 ft/sec   Stairs Yes   Stairs Assistance 6: Modified independent (Device/Increase time)   Stair Management Technique One rail Right;One rail Left;Two rails;Alternating pattern;Step to pattern;Forwards   Number of Stairs 16  4x4 reps   Height of Stairs 6   Gait Comments pt descends stairs leading with stronger leg; educated in leading with weaker leg and pt reports she feels less steady with this technique; pt varied step to vs through on multiple attempts     Standardized Balance Assessment   Standardized Balance Assessment Timed Up and Go Test     Timed Up and Go Test   Normal TUG (seconds) 11.47   Cognitive TUG (seconds) 15   >10% difference than normal TUG indicative of lower function     LLE Tone   LLE Tone Mild  pt reports  toe flexion inside shoes uncontrollably            Objective measurements completed on examination: See above findings.                  PT Education - 08/22/17 1258    Education provided Yes   Education Details results of PT eval compared to on discharge from OPPT 10/05/16; rationale for a more supportive ankle support due to excessive pronation of left ankle/foot in weightbearing with knee then dropping medially, causing stress up through hip/pelvis/spine; educated re: options for ankle support 1. use her AFO, 2. use of air cast with foot-up brace, 3. use of ASO with foot-up brace; pro's and con's of each. Pt provided written information re: names of braces and she plans to order on [employer]. Instructed to bring all braces to next visit for assessment of most effective system.    Person(s) Educated Patient   Methods Explanation;Demonstration;Verbal cues;Handout   Comprehension Verbalized understanding;Need further instruction          PT Short Term Goals - 08/22/17 1904      PT SHORT TERM GOAL #1   Title Patient will demonstrate independence with basic HEP to address ankle weakness, knee pain/alignment, and gait deviations (Target for all STGs: by 3rd visit--timeframe TBD by MCD)   Baseline Patient has been doing previous HEP, however now is a member of a gym and needs instruction in updated HEP that she can complete with gym equipment.   Time 3   Period Weeks   Status New     PT SHORT TERM GOAL #2   Title Patient will ambulate modified indpendent with appropriate brace to control overpronation and weak dorsiflexors >1000 ft on level indoor surface    Baseline Patient currently can only walk ~660 ft on level indoor surface (1/2 of outer aisles at [facility]) before knee pain/weakness become unsafe for her to proceed (fears knee buckle and fall).    Time 3   Period  Weeks   Status New     PT SHORT TERM GOAL #3   Title Patient will ambulate >20 minutes on treadmill at >2.2 mph with LLE pain <4/10.   Baseline can walk 15 minutes at max of 2.2 mph and stops due to LLE pain and fatigue   Time 3   Period Weeks   Status New     PT SHORT TERM GOAL #4   Title Patient will improve difference in TUG cognitive to TUG normal to <=20%.   Baseline TUG normal 11.47 sec; TUG cognitive 15.0 sec (TUG cognitive 31% higher than TUG normal)   Time 3   Period Weeks           PT Long Term Goals - 08/22/17 1928      PT LONG TERM GOAL #1   Title Patient will demonstrate independence with updated HEP to address ankle weakness, knee pain/alignment, and gait deviations (Target for all LTGs: by 6th visit--timeframe TBD by MCD)   Baseline Patient has been doing previous HEP, however now is a member of a gym and needs instruction in updated HEP that she can complete with gym equipment.   Time 6   Period Weeks   Status New     PT LONG TERM GOAL #2   Title Patient will ambulate modified independent with appropriate brace to control overpronation and weak dorsiflexors >1320 ft on level indoor surface (equivalent to full lap at [employer])   Baseline Patient currently can only walk ~  660 ft on level indoor surface (1/2 of outer aisles at [facility]) before knee pain/weakness become unsafe for her to proceed (fears knee buckle and fall).    Time 6   Period Weeks   Status New     PT LONG TERM GOAL #3   Title Patient will ambulate >20 minutes on treadmill at >=2.8 mph with LLE pain <2/10.   Baseline can walk 15 minutes at max of 2.2 mph and stops due to LLE pain and fatigue   Time 6   Period Weeks   Status New                Plan - 08/22/17 1513    Clinical Impression Statement Patient presents to physical therapy with decline in her tolerance for walking due to right knee/thigh/hip pain due to LLE deficits from TBI and CVA. She has been going to the gym in an  attempt to lose weight and she has been unable to advance her walking beyond 15 minutes on the treadmill due to LLE pain "from knee falling in." Patient has multiple gait deviations due to TBI and CVA which resulted in LLE weakness, especially in her ankle. The malalignment at her ankle translates to poor alignment in her knee, hip, pelvis, and spine. Cumulatively she is at risk for early onset arthritis from weightbearing with joints in poor alignment. She can benefit from short course of PT to address appropriate bracing of left foot/ankle (which she is now open to exploring different bracing options to address both weak dorsiflexors and supinators). She will also benefit from the PT interventions listed below to address the deficits listed below.    History and Personal Factors relevant to plan of care: PMH-02/09/16 TBI (suicide attempt-jump from 5th floor), pelvic fx's with SI screw, 05/10/16 R MCA infarct, lt hemi, lbp, gait disorder; Personal factors-home situation (not supportive of her therapies), behavioral responses   Clinical Presentation Evolving   Clinical Presentation due to: Patient's pain in ankle, knee, hip and spine are progressively worsening as she attempts to exercise to lose weight   Clinical Decision Making Moderate   Rehab Potential Good   Clinical Impairments Affecting Rehab Potential Pain, cognitive deficits (although has successfully participated in and benefitted from OPPT previously)   PT Frequency 1x / week   PT Duration 6 weeks   PT Treatment/Interventions ADLs/Self Care Home Management;Functional mobility training;Stair training;Gait training;DME Instruction;Therapeutic activities;Therapeutic exercise;Balance training;Neuromuscular re-education;Patient/family education;Orthotic Fit/Training;Passive range of motion;Energy conservation   PT Next Visit Plan Patient is to bring her prior AFO and any other ankle braces she has for PT to assess if any will control her pronation.  Initiate HEP (including things she can do at the gym). Gait training   Consulted and Agree with Plan of Care Patient      Patient will benefit from skilled therapeutic intervention in order to improve the following deficits and impairments:  Abnormal gait, Decreased activity tolerance, Decreased knowledge of use of DME, Decreased mobility, Decreased range of motion, Decreased strength, Difficulty walking, Impaired flexibility, Impaired tone, Obesity, Pain  Visit Diagnosis: Muscle weakness (generalized) - Plan: PT plan of care cert/re-cert  Other abnormalities of gait and mobility - Plan: PT plan of care cert/re-cert  Foot drop, left - Plan: PT plan of care cert/re-cert  Pain in left leg - Plan: PT plan of care cert/re-cert     Problem List Patient Active Problem List   Diagnosis Date Noted  . Schizophrenia (HCC) 02/23/2017  . PTSD (post-traumatic stress  disorder) 02/17/2017  . GAD (generalized anxiety disorder) 02/17/2017  . Insomnia due to other mental disorder 02/17/2017  . Hyperlipidemia 09/24/2016  . Seizures (HCC) 06/18/2016  . Hx of suicide attempt   . 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 Left hemiparesis (HCC)   . S/P thoracotomy 05/14/2016  . Cerebral embolism with cerebral infarction 05/11/2016  . 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 Multiple fractures of pelvis without disruption of pelvic ring, sequela 03/03/2016  . Left wrist fracture 03/03/2016  . Multiple fractures of ribs of right side 03/03/2016  . Fracture of multiple ribs of left side 03/03/2016  . Liver laceration 03/03/2016  . Small intestine injury 03/03/2016  . Acute blood loss anemia 03/03/2016  . Suicide attempt (HCC) 03/03/2016  . Bilateral pulmonary contusion 03/03/2016  . Acute respiratory failure (HCC) 03/03/2016  . Hypokalemia 03/03/2016  . Hypernatremia 03/03/2016  . Acute  kidney injury (HCC) 03/03/2016  . Ingestion of caustic substance 03/03/2016  . Tylenol overdose 03/03/2016  . Hyperglycemia 03/03/2016  . Hepatic failure (HCC) 03/03/2016  . Pressure ulcer 02/26/2016  . Fall from, out of or through building, not otherwise specified, initial encounter 02/09/2016  . Hypovolemic shock (HCC) 02/09/2016    Zena Amos , PT 08/22/2017, 7:46 PM  [facility] [address] [place], [place], [address] Phone: [phone]   Fax:  [phone]  Name: [name] Bell MRN: [id number] Date of Birth: [birth year]

## 2017-09-07 DIAGNOSIS — Z0271 Encounter for disability determination: Secondary | ICD-10-CM

## 2017-09-08 ENCOUNTER — Ambulatory Visit: Payer: Medicaid Other | Attending: Physical Medicine & Rehabilitation | Admitting: Physical Therapy

## 2017-09-08 DIAGNOSIS — M21372 Foot drop, left foot: Secondary | ICD-10-CM | POA: Insufficient documentation

## 2017-09-08 DIAGNOSIS — R2689 Other abnormalities of gait and mobility: Secondary | ICD-10-CM

## 2017-09-08 DIAGNOSIS — M6281 Muscle weakness (generalized): Secondary | ICD-10-CM | POA: Insufficient documentation

## 2017-09-08 NOTE — Patient Instructions (Signed)
ANKLE: Dorsiflexion (Band)    Sit at edge of surface. Place band around top of foot. Keeping heel on floor, raise toes of banded foot. Hold _2__ seconds. Use ____red____ band. 2 minutes at a time; numerous times per day;  _7__ days per week  Copyright  VHI. All rights reserved.    ABDUCTION: Standing (Active)    Stand, feet flat. Lift right leg out to side. Hold 2 seconds. Then lift left leg out to side and hold 2 seconds. Complete __1_ sets of __20_ repetitions. Perform _3__ days per week.  http://gtsc.exer.us/111   Copyright  VHI. All rights reserved.   Abduction: Clam (Eccentric) - Side-Lying    Lie on side with knees bent. Put blue band above your knees (around your thighs) Lift top knee, keeping feet together. Keep trunk steady.Hold 2 seconds. Slowly lower  _10__ reps per set, _1_ sets per day, _3__ days per week.   http://ecce.exer.us/65   Copyright  VHI. All rights reserved.   (Clinic) Hip: External Rotation - Sitting    Left side toward band (tied around bed post); put foot through green band around ankle, leg out, knees bent. Pull foot toward body. May hold table to keep body still. Repeat __10__ times per set. Do __2__ sets per session. Do __3__ sessions per week.   Copyright  VHI. All rights reserved.

## 2017-09-08 NOTE — Therapy (Signed)
[facility] [address] [place], [place], [address] Phone: [phone]   Fax:  [phone]  Physical Therapy Treatment  Patient Details  Name: [name] BobKatia D Dengel MRN: [id number] Date of Birth: 12-08-1980 Referring Provider: Faith RogueSwartz, Zachary MD  Encounter Date: 09/08/2017      PT End of Session - 09/08/17 1722    Visit Number 2   Number of Visits 4  eval plus 3 visits (initial certification)   Date for PT Re-Evaluation 09/26/17  TBA based on MCD authorization   Authorization Type MCD   Authorization Time Period 09/07/17  to  09/26/17   Authorization - Visit Number 1   Authorization - Number of Visits 3   PT Start Time 0940   PT Stop Time 1029   PT Time Calculation (min) 49 min   Activity Tolerance Patient tolerated treatment well   Behavior During Therapy [facility] for tasks assessed/performed      Past Medical History:  Diagnosis Date  . ADD (attention deficit disorder)    has been on medication  . ADHD (attention deficit hyperactivity disorder)   . Anemia   . Anxiety   . Depression   . H/O suicide attempt    at age 36.   Marland Kitchen. PTSD (post-traumatic stress disorder)   . Seizures (HCC)   . Stroke [facility](HCC)     Past Surgical History:  Procedure Laterality Date  . APPLICATION OF WOUND VAC  02/09/2016   Procedure: APPLICATION OF WOUND VAC;  Surgeon: Jimmye NormanJames [name], MD;  [facility];  Service: General;;  . APPLICATION OF WOUND VAC N/A 02/16/2016   Procedure: RE-APPLICATION OF WOUND VAC;  Surgeon: [name] Thompson, MD;  Location: [facility] OR;  Service: General;  Laterality: N/A;  . BOWEL RESECTION  02/09/2016   Procedure: SMALL BOWEL RESECTION, MESENTERIC REPAIR;  Surgeon: Jimmye NormanJames [name], MD;  [facility];  Service: General;;  . CHEST TUBE INSERTION Right 02/11/2016   Procedure: CHEST TUBE INSERTION;  Surgeon: Jimmye NormanJames [name], MD;  Location: [facility] OR;  Service: General;  Laterality: Right;  . CHEST TUBE INSERTION Right 02/26/2016   Procedure:  CHEST TUBE INSERTION;  Surgeon: Kerin PernaPeter Van Trigt, MD;  [facility];  Service: Thoracic;  Laterality: Right;  . CHOLECYSTECTOMY  02/09/2016   Procedure: CHOLECYSTECTOMY;  Surgeon: Jimmye NormanJames [name], MD;  [facility];  Service: General;;  . ESOPHAGOGASTRODUODENOSCOPY N/A 02/10/2016   Procedure: ESOPHAGOGASTRODUODENOSCOPY (EGD);  Surgeon: Sherrilyn RistHenry L Danis III, MD;  [facility];  Service: Endoscopy;  Laterality: N/A;  . EXTERNAL FIXATION PELVIS  02/09/2016   Procedure: EXTERNAL FIXATION PELVIS;  Surgeon: Myrene GalasMichael [name], MD;  [facility];  Service: Orthopedics;;  . HEMATOMA EVACUATION Right 05/14/2016   Procedure: EVACUATION HEMATOMA;  Surgeon: Alleen BorneBryan K Bartle, MD;  Location: [facility] OR;  Service: Thoracic;  Laterality: Right;  Evacuation of Hematoma Right Chest  . LACERATION REPAIR  02/09/2016   Procedure: REPAIR LIVER LACERATION;  Surgeon: Jimmye NormanJames [name], MD;  [facility];  Service: General;;  . LAPAROTOMY N/A 02/10/2016   Procedure: EXPLORATORY LAPAROTOMY, removal of packs,  cauterization of liver, repacking of liver, and open abdomen vac application;  Surgeon: [name] Thompson, MD;  [facility];  Service: General;  Laterality: N/A;  . LAPAROTOMY N/A 02/11/2016   Procedure: EXPLORATORY LAPAROTOMY VAC CHANGE ;  Surgeon: Jimmye NormanJames [name], MD;  [facility]: [facility] OR;  Service: General;  Laterality: N/A;  . LAPAROTOMY N/A 02/13/2016   Procedure: EXPLORATORY LAPAROTOMY, REMOVAL OF PACKS, ABDOMINAL VAC DRESSING CHANGE;  Surgeon:  [name], MD;  [facility];  Service: General;  Laterality: N/A;  . LAPAROTOMY N/A 02/18/2016   Procedure: EXPLORATORY LAPAROTOMY, PLACEMENT OF ABRA ABDOMINAL WALL CLOSURE SET;  Surgeon: Jimmye Norman, MD;  [facility] OR;  Service: General;  Laterality: N/A;  . LAPAROTOMY N/A 02/09/2016   Procedure: EXPLORATORY LAPAROTOMY;  Surgeon: Jimmye Norman, MD;  Location: [facility] OR;  Service: General;  Laterality: N/A;  . LAPAROTOMY N/A 02/16/2016   Procedure: EXPLORATORY LAPAROTOMY, ABDOMINAL WASH OUT;   Surgeon: [name], MD;  Location: [facility] OR;  Service: General;  Laterality: N/A;  . PERCUTANEOUS TRACHEOSTOMY N/A 03/01/2016   Procedure: PERCUTANEOUS TRACHEOSTOMY;  Surgeon: Jimmye Norman, MD;  Location: [facility] OR;  Service: General;  Laterality: N/A;  Theodoro Parma PINNING Right 02/16/2016   Procedure: Loyal Gambler;  Surgeon: Myrene Galas, MD;  Location: [facility] OR;  Service: Orthopedics;  Laterality: Right;  . TEE WITHOUT CARDIOVERSION N/A 05/14/2016   Procedure: TRANSESOPHAGEAL ECHOCARDIOGRAM (TEE);  Surgeon: Alleen Borne, MD;  [facility];  Service: Thoracic;  Laterality: N/A;  . THORACOTOMY/LOBECTOMY Right 04/26/2016   Procedure: Right THORACOTOMY AND DRAINAGE OF EMPYEMA;  Surgeon: Alleen Borne, MD;  [facility] OR;  Service: Thoracic;  Laterality: Right;  Marland Kitchen VIDEO ASSISTED THORACOSCOPY (VATS)/THOROCOTOMY Right 05/14/2016   Procedure: RIGHT VIDEO ASSISTED THORACOSCOPY (VATS),DRAINAGE OF EMPYEMA;  Surgeon: Alleen Borne, MD;  Location: [facility] OR;  Service: Thoracic;  Laterality: Right;  . WOUND DEBRIDEMENT N/A 03/01/2016   Procedure: ABDOMINAL WOUND CLOSURE;  Surgeon: Jimmye Norman, MD;  Location: [facility] OR;  Service: General;  Laterality: N/A;    There were no vitals filed for this visit.      Subjective Assessment - 09/08/17 0944    Subjective Went to chiropractor yesterday and had cold laser to tail bone and really decreased her pain. Can sit without pain even today. Working out more and has been looking up exercises to do online   Limitations Sitting;Walking;Standing   How long can you sit comfortably? 5 min   How long can you stand comfortably? 15 min    How long can you walk comfortably? 1/2 way through [employer]; treadmill 15 minutes   Patient Stated Goals Would like to get my leg straight ("my knee turns in and my foot turns out")   Pain Onset More than a month ago                         [facility] Adult PT Treatment/Exercise - 09/08/17 1704      Ambulation/Gait    Ambulation/Gait Assistance 6: Modified independent (Device/Increase time)   Ambulation Distance (Feet) 40 Feet  30,    Assistive device Other (Comment)  Lt ASO, foot-up brace   Gait Pattern Step-through pattern;Decreased step length - right;Decreased stance time - left;Decreased dorsiflexion - left;Decreased weight shift to left;Lateral trunk lean to right;Decreased trunk rotation;Wide base of support   Ambulation Surface Indoor     Exercises   Exercises Knee/Hip;Ankle     Knee/Hip Exercises: Standing   Heel Raises Both;1 set;10 reps;2 seconds  educated to move slower and hold 2 sec   Hip Abduction Stengthening;Both;1 set;20 reps;Knee straight  educated to alternate legs, 2 sec hold, not lean trunk   Lateral Step Up Left;Hand Hold: 2;Step Height: 6"  did not feel safe/secure, prefers forward   Forward Step Up Left;1 set;10 reps;Hand Hold: 1;Step Height: 6"  pushing up with RLE too much; focus on using LLE     Knee/Hip  Exercises: Seated   Long Arc Quad Limitations discussed use of knee extension machine at the gym and to do with one leg at a time as left leg weaker than rt    Other Seated Knee/Hip Exercises hip external rotation with green band at ankle     Ankle Exercises: Seated   Toe Raise 10 reps   Toe Raise Limitations with red band, no foot up brace                PT Education - 09/08/17 1721    Education provided Yes   Education Details see HEP; focus on strengthening left hip abdct to support LE in stance and support knee   Person(s) Educated Patient   Methods Explanation;Demonstration;Verbal cues;Handout   Comprehension Verbalized understanding;Returned demonstration;Verbal cues required;Need further instruction          PT Short Term Goals - 08/22/17 1904      PT SHORT TERM GOAL #1   Title Patient will demonstrate independence with basic HEP to address ankle weakness, knee pain/alignment, and gait deviations (Target for all STGs: by 3rd visit--timeframe  TBD by MCD)   Baseline Patient has been doing previous HEP, however now is a member of a gym and needs instruction in updated HEP that she can complete with gym equipment.   Time 3   Period Weeks   Status New     PT SHORT TERM GOAL #2   Title Patient will ambulate modified indpendent with appropriate brace to control overpronation and weak dorsiflexors >1000 ft on level indoor surface    Baseline Patient currently can only walk ~660 ft on level indoor surface (1/2 of outer aisles at [facility]) before knee pain/weakness become unsafe for her to proceed (fears knee buckle and fall).    Time 3   Period Weeks   Status New     PT SHORT TERM GOAL #3   Title Patient will ambulate >20 minutes on treadmill at >2.2 mph with LLE pain <4/10.   Baseline can walk 15 minutes at max of 2.2 mph and stops due to LLE pain and fatigue   Time 3   Period Weeks   Status New     PT SHORT TERM GOAL #4   Title Patient will improve difference in TUG cognitive to TUG normal to <=20%.   Baseline TUG normal 11.47 sec; TUG cognitive 15.0 sec (TUG cognitive 31% higher than TUG normal)   Time 3   Period Weeks           PT Long Term Goals - 08/22/17 1928      PT LONG TERM GOAL #1   Title Patient will demonstrate independence with updated HEP to address ankle weakness, knee pain/alignment, and gait deviations (Target for all LTGs: by 6th visit--timeframe TBD by MCD)   Baseline Patient has been doing previous HEP, however now is a member of a gym and needs instruction in updated HEP that she can complete with gym equipment.   Time 6   Period Weeks   Status New     PT LONG TERM GOAL #2   Title Patient will ambulate modified independent with appropriate brace to control overpronation and weak dorsiflexors >1320 ft on level indoor surface (equivalent to full lap at [facility])   Baseline Patient currently can only walk ~660 ft on level indoor surface (1/2 of outer aisles at [facility]) before knee pain/weakness become  unsafe for her to proceed (fears knee buckle and fall).    Time 6  Period Weeks   Status New     PT LONG TERM GOAL #3   Title Patient will ambulate >20 minutes on treadmill at >=2.8 mph with LLE pain <2/10.   Baseline can walk 15 minutes at max of 2.2 mph and stops due to LLE pain and fatigue   Time 6   Period Weeks   Status New               Plan - 09/08/17 1726    Clinical Impression Statement Session focused on evaluating the options she brought in for her Lt ankle brace (ASO, foot-up brace, air cast, AFO-carbon fiber posterior support with medial strut--which pt reports her foot rolls into and rubs her foot). Determined currently her best option is the ASO and foot-up brace together and plan to strengthen her left hip to assist in preventing force on medial displacement of left knee in stance. HEP issued and given red,, green and blue theraband. Patient very motivated and anticipate she can benefit from continued PT.    Rehab Potential Good   Clinical Impairments Affecting Rehab Potential Pain, cognitive deficits (although has successfully participated in and benefitted from OPPT previously)   PT Frequency 1x / week   PT Duration 6 weeks   PT Treatment/Interventions ADLs/Self Care Home Management;Functional mobility training;Stair training;Gait training;DME Instruction;Therapeutic activities;Therapeutic exercise;Balance training;Neuromuscular re-education;Patient/family education;Orthotic Fit/Training;Passive range of motion;Energy conservation   PT Next Visit Plan ?if MCD would cover a new AFO (current issued in 2017) would try carbon fiber with t-strap; check ex's issued 11/1--esp clams as not demonstrated, just described; further assess gait for other causes of knee dropping medially; add to HEP, especially things she can do at the gym).    Consulted and Agree with Plan of Care Patient      Patient will benefit from skilled therapeutic intervention in order to improve the  following deficits and impairments:  Abnormal gait, Decreased activity tolerance, Decreased knowledge of use of DME, Decreased mobility, Decreased range of motion, Decreased strength, Difficulty walking, Impaired flexibility, Impaired tone, Obesity, Pain  Visit Diagnosis: Muscle weakness (generalized)  Other abnormalities of gait and mobility  Foot drop, left     Problem List Patient Active Problem List   Diagnosis Date Noted  . Schizophrenia (HCC) 02/23/2017  . PTSD (post-traumatic stress disorder) 02/17/2017  . GAD (generalized anxiety disorder) 02/17/2017  . Insomnia due to other mental disorder 02/17/2017  . Hyperlipidemia 09/24/2016  . Seizures (HCC) 06/18/2016  . Hx of suicide attempt   . 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 Left hemiparesis (HCC)   . S/P thoracotomy 05/14/2016  . Cerebral embolism with cerebral infarction 05/11/2016  . 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 Multiple fractures of pelvis without disruption of pelvic ring, sequela 03/03/2016  . Left wrist fracture 03/03/2016  . Multiple fractures of ribs of right side 03/03/2016  . Fracture of multiple ribs of left side 03/03/2016  . Liver laceration 03/03/2016  . Small intestine injury 03/03/2016  . Acute blood loss anemia 03/03/2016  . Suicide attempt (HCC) 03/03/2016  . Bilateral pulmonary contusion 03/03/2016  . Acute respiratory failure (HCC) 03/03/2016  . Hypokalemia 03/03/2016  . Hypernatremia 03/03/2016  . Acute kidney injury (HCC) 03/03/2016  . Ingestion of caustic substance 03/03/2016  . Tylenol overdose 03/03/2016  . Hyperglycemia 03/03/2016  . Hepatic failure (HCC) 03/03/2016  . Pressure ulcer 02/26/2016  . Fall  from, out of or through building, not otherwise specified, initial encounter 02/09/2016  . Hypovolemic shock (HCC) 02/09/2016    Zena Amos, PT 09/08/2017,  5:38 PM  [facility] [address] [place], [place], [address] Phone: [phone]   Fax:  [phone]  Name: FRANCIES INCH MRN: [id number] Date of Birth: 1981/10/02

## 2017-09-15 ENCOUNTER — Ambulatory Visit: Payer: Self-pay | Admitting: Physical Therapy

## 2017-09-16 ENCOUNTER — Encounter: Payer: Self-pay | Admitting: Physical Therapy

## 2017-09-16 ENCOUNTER — Ambulatory Visit: Payer: Medicaid Other | Admitting: Physical Therapy

## 2017-09-16 DIAGNOSIS — M6281 Muscle weakness (generalized): Secondary | ICD-10-CM

## 2017-09-16 DIAGNOSIS — R2689 Other abnormalities of gait and mobility: Secondary | ICD-10-CM

## 2017-09-16 NOTE — Patient Instructions (Signed)
Abduction: Clam (Eccentric) - Side-Lying    Lie on side with knees bent.  Lift top knee, keeping heels together. Keep trunk steady.Hold 2 seconds. Slowly lower  _10__ reps per set, _1_ sets per day, _3__ days per week.   Then lie on your back and put blue band above your knees (around your thighs). Keep Right leg still and open your left leg out to the side. Hold for 2 seconds and slowly return.  _10__ reps per set, _1_ sets per day, _3__ days per week.

## 2017-09-16 NOTE — Therapy (Signed)
[facility] [address] [place], [place], [address] Phone: [phone]   Fax:  [phone]  Physical Therapy Treatment  Patient Details  Name: [name] Bell MRN: [id number] Date of Birth: 03/30/1981 Referring Provider: Faith Rogue MD   Encounter Date: 09/16/2017  PT End of Session - 09/16/17 1520    Visit Number  3    Number of Visits  4 eval plus 3 visits (initial certification)    Date for PT Re-Evaluation  09/26/17 TBA based on MCD authorization    Authorization Type  MCD    Authorization Time Period  09/07/17  to  09/26/17    Authorization - Visit Number  2    Authorization - Number of Visits  3    PT Start Time  1020    PT Stop Time  1101    PT Time Calculation (min)  41 min    Activity Tolerance  Patient tolerated treatment well    Behavior During Therapy  [facility] for tasks assessed/performed       Past Medical History:  Diagnosis Date  . ADD (attention deficit disorder)    has been on medication  . ADHD (attention deficit hyperactivity disorder)   . Anemia   . Anxiety   . Depression   . H/O suicide attempt    at age 33.   Marland Kitchen PTSD (post-traumatic stress disorder)   . Seizures (HCC)   . Stroke [facility])     History reviewed. No pertinent surgical history.  There were no vitals filed for this visit.  Subjective Assessment - 09/16/17 1022    Subjective  Went to a friend's home and as leaving she lost her balance, grabbed rail which was loose, stepped down awkward and Rt knee has been hurting since. Currently only hurts with step ups and heavier weight on leg press.    Limitations  Sitting;Walking;Standing    How long can you sit comfortably?  5 min    How long can you stand comfortably?  15 min     How long can you walk comfortably?  1/2 way through [employer]; treadmill 15 minutes    Patient Stated Goals  Would like to get my leg straight ("my knee turns in and my foot turns out")    Currently in  Pain?  No/denies    Pain Onset  More than a month ago                      [facility] Adult PT Treatment/Exercise - 09/16/17 0001      Knee/Hip Exercises: Aerobic   Tread Mill  6 minutes up to 2.7 mph without RLE "dropping in"; reports she does 20 minutes at the gym      Knee/Hip Exercises: Seated   Other Seated Knee/Hip Exercises  hip external rotation with green band at ankle; reviewed as pt could not remember how to configure band at home       Knee/Hip Exercises: Sidelying   Clams  sidelying x 10 no band x 2 sets; supine with green band LLE only RLE static x 10 reps     Step ups x 5 to elicit Rt knee pain (which it did and pt able to show PT the area)  Stair training x 5 steps for better sequencing to protect Rt knee         PT Education - 09/16/17 1518    Education provided  Yes    Education Details  answered  pt's questions re: recently added exercises to HEP; instructed to avoid painful exercises for rt knee and reviewed safe method of doing steps while knee is hurting    Person(s) Educated  Patient    Methods  Explanation;Demonstration    Comprehension  Verbalized understanding;Returned demonstration       PT Short Term Goals - 08/22/17 1904      PT SHORT TERM GOAL #1   Title  Patient will demonstrate independence with basic HEP to address ankle weakness, knee pain/alignment, and gait deviations (Target for all STGs: by 3rd visit--timeframe TBD by MCD)    Baseline  Patient has been doing previous HEP, however now is a member of a gym and needs instruction in updated HEP that she can complete with gym equipment.    Time  3    Period  Weeks    Status  New      PT SHORT TERM GOAL #2   Title  Patient will ambulate modified indpendent with appropriate brace to control overpronation and weak dorsiflexors >1000 ft on level indoor surface     Baseline  Patient currently can only walk ~660 ft on level indoor surface (1/2 of outer [facility]les [facility]) before knee  pain/weakness become unsafe for her to proceed (fears knee buckle and fall).     Time  3    Period  Weeks    Status  New      PT SHORT TERM GOAL #3   Title  Patient will ambulate >20 minutes on treadmill at >2.2 mph with LLE pain <4/10.    Baseline  can walk 15 minutes at max of 2.2 mph and stops due to LLE pain and fatigue    Time  3    Period  Weeks    Status  New      PT SHORT TERM GOAL #4   Title  Patient will improve difference in TUG cognitive to TUG normal to <=20%.    Baseline  TUG normal 11.47 sec; TUG cognitive 15.0 sec (TUG cognitive 31% higher than TUG normal)    Time  3    Period  Weeks        PT Long Term Goals - 08/22/17 1928      PT LONG TERM GOAL #1   Title  Patient will demonstrate independence with updated HEP to address ankle weakness, knee pain/alignment, and gait deviations (Target for all LTGs: by 6th visit--timeframe TBD by MCD)    Baseline  Patient has been doing previous HEP, however now is a member of a gym and needs instruction in updated HEP that she can complete with gym equipment.    Time  6    Period  Weeks    Status  New      PT LONG TERM GOAL #2   Title  Patient will ambulate modified independent with appropriate brace to control overpronation and weak dorsiflexors >1320 ft on level indoor surface ([facility])    Baseline  Patient currently can only walk ~660 ft on level indoor surface (1/2 of outer aisles at [facility]) before knee pain/weakness become unsafe for her to proceed (fears knee buckle and fall).     Time  6    Period  Weeks    Status  New      PT LONG TERM GOAL #3   Title  Patient will ambulate >20 minutes on treadmill at >=2.8 mph with LLE pain <2/10.    Baseline  can walk 15 minutes at max of 2.2 mph and stops due to LLE pain and fatigue    Time  6    Period  Weeks    Status  New            Plan - 09/16/17 1523    Clinical Impression Statement  Initial portion of session assessed rt knee pain to  assure proceeding with PT was appropriate. No pain when testing lateral ligament; +pain with heavily resisted knee extension and appears muscular in nature. Proceeded to working on her HEP and discussing which machines at the gym she is safe to use and which to avoid. Began looking at her LTGs and discussed whether she will need her last visit. Agreed to not cancel her last appointment in case knee pain worsens or she thinks of other questions to address. Pt aware to call and cancel next appt if she does not feel she needs to come.     Rehab Potential  Good    Clinical Impairments Affecting Rehab Potential  Pain, cognitive deficits (although has successfully participated in and benefitted from OPPT previously)    PT Frequency  1x / week    PT Duration  6 weeks    PT Treatment/Interventions  ADLs/Self Care Home Management;Functional mobility training;Stair training;Gait training;DME Instruction;Therapeutic activities;Therapeutic exercise;Balance training;Neuromuscular re-education;Patient/family education;Orthotic Fit/Training;Passive range of motion;Energy conservation    PT Next Visit Plan  check LTGs and d/c    Consulted and Agree with Plan of Care  Patient       Patient will benefit from skilled therapeutic intervention in order to improve the following deficits and impairments:  Abnormal gait, Decreased activity tolerance, Decreased knowledge of use of DME, Decreased mobility, Decreased range of motion, Decreased strength, Difficulty walking, Impaired flexibility, Impaired tone, Obesity, Pain  Visit Diagnosis: Muscle weakness (generalized)  Other abnormalities of gait and mobility     Problem List Patient Active Problem List   Diagnosis Date Noted  . Schizophrenia (HCC) 02/23/2017  . PTSD (post-traumatic stress disorder) 02/17/2017  . GAD (generalized anxiety disorder) 02/17/2017  . Insomnia due to other mental disorder 02/17/2017  . Hyperlipidemia 09/24/2016  . Seizures (HCC)  06/18/2016  . Hx of suicide attempt   . 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 Left hemiparesis (HCC)   . S/P thoracotomy 05/14/2016  . Cerebral embolism with cerebral infarction 05/11/2016  . 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 Multiple fractures of pelvis without disruption of pelvic ring, sequela 03/03/2016  . Left wrist fracture 03/03/2016  . Multiple fractures of ribs of right side 03/03/2016  . Fracture of multiple ribs of left side 03/03/2016  . Liver laceration 03/03/2016  . Small intestine injury 03/03/2016  . Acute blood loss anemia 03/03/2016  . Suicide attempt (HCC) 03/03/2016  . Bilateral pulmonary contusion 03/03/2016  . Acute respiratory failure (HCC) 03/03/2016  . Hypokalemia 03/03/2016  . Hypernatremia 03/03/2016  . Acute kidney injury (HCC) 03/03/2016  . Ingestion of caustic substance 03/03/2016  . Tylenol overdose 03/03/2016  . Hyperglycemia 03/03/2016  . Hepatic failure (HCC) 03/03/2016  . Pressure ulcer 02/26/2016  . Fall from, out of or through building, not otherwise specified, initial encounter 02/09/2016  . Hypovolemic shock (HCC) 02/09/2016    Zena Amos, PT 09/16/2017, 3:29 PM  [facility] [address] [place], [place], [address] Phone: [phone]  Fax:  [phone]  Name: [name] Bell MRN: [id number] Date of Birth: 02/09/1981

## 2017-09-22 ENCOUNTER — Ambulatory Visit: Payer: Self-pay | Admitting: Physical Therapy

## 2017-09-23 ENCOUNTER — Ambulatory Visit: Payer: Self-pay | Admitting: Physical Therapy

## 2017-10-05 ENCOUNTER — Ambulatory Visit: Payer: Medicaid Other | Attending: Family Medicine | Admitting: Family Medicine

## 2017-10-05 ENCOUNTER — Other Ambulatory Visit (HOSPITAL_COMMUNITY)
Admission: RE | Admit: 2017-10-05 | Discharge: 2017-10-05 | Disposition: A | Discharge status: Home / Self Care | Payer: Medicaid Other | Source: Ambulatory Visit | Attending: Family Medicine | Admitting: Family Medicine

## 2017-10-05 ENCOUNTER — Encounter: Payer: Self-pay | Admitting: Family Medicine

## 2017-10-05 VITALS — BP 116/78 | HR 67 | Temp 97.9°F | Ht 72.0 in | Wt 271.2 lb

## 2017-10-05 DIAGNOSIS — Z915 Personal history of self-harm: Secondary | ICD-10-CM | POA: Insufficient documentation

## 2017-10-05 DIAGNOSIS — Z79899 Other long term (current) drug therapy: Secondary | ICD-10-CM | POA: Diagnosis not present

## 2017-10-05 DIAGNOSIS — R569 Unspecified convulsions: Secondary | ICD-10-CM | POA: Diagnosis not present

## 2017-10-05 DIAGNOSIS — Z87820 Personal history of traumatic brain injury: Secondary | ICD-10-CM | POA: Diagnosis not present

## 2017-10-05 DIAGNOSIS — Z01419 Encounter for gynecological examination (general) (routine) without abnormal findings: Secondary | ICD-10-CM | POA: Diagnosis not present

## 2017-10-05 DIAGNOSIS — Z7982 Long term (current) use of aspirin: Secondary | ICD-10-CM | POA: Insufficient documentation

## 2017-10-05 DIAGNOSIS — Z124 Encounter for screening for malignant neoplasm of cervix: Secondary | ICD-10-CM | POA: Diagnosis not present

## 2017-10-05 DIAGNOSIS — F4310 Post-traumatic stress disorder, unspecified: Secondary | ICD-10-CM | POA: Diagnosis not present

## 2017-10-05 DIAGNOSIS — Z0000 Encounter for general adult medical examination without abnormal findings: Secondary | ICD-10-CM

## 2017-10-05 DIAGNOSIS — F329 Major depressive disorder, single episode, unspecified: Secondary | ICD-10-CM | POA: Diagnosis not present

## 2017-10-05 DIAGNOSIS — Z9049 Acquired absence of other specified parts of digestive tract: Secondary | ICD-10-CM | POA: Insufficient documentation

## 2017-10-05 DIAGNOSIS — F909 Attention-deficit hyperactivity disorder, unspecified type: Secondary | ICD-10-CM | POA: Insufficient documentation

## 2017-10-05 NOTE — Patient Instructions (Signed)

## 2017-10-05 NOTE — Progress Notes (Signed)
Subjective:  Patient ID: [name] Bell, female    DOB: 01/21/1981  Age: 36 y.o. MRN: [id number]  CC: Gynecologic Exam   HPI [name] Bell is a 36 year old female with a medical history of  traumatic brain injury with loss of consciousness (secondary to suicide attempt in 05/2016), seizures, multiple rib and pelvic surgeries (status post pinning), abdominal laceration with subsequent bowel resection and wound healing by secondary intention, depression who comes in for a gynecological exam. States she is up-to-date on Tdap.  She denies dysuria, vaginal discharge, abdominal pain, nausea or vomiting. She is closely followed by her therapist and also continues to see neurology with an upcoming appointment next year. She has no acute concerns today.  Past Medical History:  Diagnosis Date  . ADD (attention deficit disorder)    has been on medication  . ADHD (attention deficit hyperactivity disorder)   . Anemia   . Anxiety   . Depression   . H/O suicide attempt    at age 45.   Marland Kitchen PTSD (post-traumatic stress disorder)   . Seizures (HCC)   . Stroke [facility])     Past Surgical History:  Procedure Laterality Date  . APPLICATION OF WOUND VAC  02/09/2016   Procedure: APPLICATION OF WOUND VAC;  Surgeon: Jimmye Norman, MD;  Location: [facility] OR;  Service: General;;  . APPLICATION OF WOUND VAC N/A 02/16/2016   Procedure: RE-APPLICATION OF WOUND VAC;  Surgeon: [name], MD;  Location: [facility] OR;  Service: General;  Laterality: N/A;  . BOWEL RESECTION  02/09/2016   Procedure: SMALL BOWEL RESECTION, MESENTERIC REPAIR;  Surgeon: Jimmye Norman, MD;  Location: [facility] OR;  Service: General;;  . CHEST TUBE INSERTION Right 02/11/2016   Procedure: CHEST TUBE INSERTION;  Surgeon: Jimmye Norman, MD;  Location: [facility] OR;  Service: General;  Laterality: Right;  . CHEST TUBE INSERTION Right 02/26/2016   Procedure: CHEST TUBE INSERTION;  Surgeon: Kerin Perna, MD;  Location: [facility] OR;  Service: Thoracic;  Laterality: Right;  .  CHOLECYSTECTOMY  02/09/2016   Procedure: CHOLECYSTECTOMY;  Surgeon: Jimmye Norman, MD;  Location[facility] OR;  Service: General;;  . ESOPHAGOGASTRODUODENOSCOPY N/A 02/10/2016   Procedure: ESOPHAGOGASTRODUODENOSCOPY (EGD);  Surgeon: Sherrilyn Rist, MD;  Location: [facility] ENDOSCOPY;  Service: Endoscopy;  Laterality: N/A;  . EXTERNAL FIXATION PELVIS  02/09/2016   Procedure: EXTERNAL FIXATION PELVIS;  Surgeon: Myrene Galas, MD;  Location: [facility] OR;  Service: Orthopedics;;  . HEMATOMA EVACUATION Right 05/14/2016   Procedure: EVACUATION HEMATOMA;  Surgeon: Alleen Borne, MD;  Location: [facility] OR;  Service: Thoracic;  Laterality: Right;  Evacuation of Hematoma Right Chest  . LACERATION REPAIR  02/09/2016   Procedure: REPAIR LIVER LACERATION;  Surgeon: Jimmye Norman, MD;  Location: [facility] OR;  Service: General;;  . LAPAROTOMY N/A 02/10/2016   Procedure: EXPLORATORY LAPAROTOMY, removal of packs,  cauterization of liver, repacking of liver, and open abdomen vac application;  Surgeon: [name], MD;  Location: [facility] OR;  Service: General;  Laterality: N/A;  . LAPAROTOMY N/A 02/11/2016   Procedure: EXPLORATORY LAPAROTOMY VAC CHANGE ;  Surgeon: Jimmye Norman, MD;  Location: [facility] OR;  Service: General;  Laterality: N/A;  . LAPAROTOMY N/A 02/13/2016   Procedure: EXPLORATORY LAPAROTOMY, REMOVAL OF PACKS, ABDOMINAL VAC DRESSING CHANGE;  Surgeon: [name], MD;  Location: [facility] OR;  Service: General;  Laterality: N/A;  . LAPAROTOMY N/A 02/18/2016   Procedure: EXPLORATORY LAPAROTOMY, PLACEMENT OF ABRA ABDOMINAL WALL CLOSURE SET;  Surgeon: Jimmye Norman, MD;  Location: [facility] OR;  Service: General;  Laterality: N/A;  . LAPAROTOMY N/A 02/09/2016   Procedure: EXPLORATORY LAPAROTOMY;  Surgeon: Jimmye NormanJames [name], MD;  [facility];  Service: General;  Laterality: N/A;  . LAPAROTOMY N/A 02/16/2016   Procedure: EXPLORATORY LAPAROTOMY, ABDOMINAL WASH OUT;  Surgeon: [name] Thompson, MD;  [facility];  Service: General;  Laterality: N/A;  . PERCUTANEOUS TRACHEOSTOMY  N/A 03/01/2016   Procedure: PERCUTANEOUS TRACHEOSTOMY;  Surgeon: Jimmye NormanJames [name], MD;  [facility];  Service: General;  Laterality: N/A;  . SACRO-ILIAC PINNING Right 02/16/2016   Procedure: Loyal GamblerSACRO-ILIAC PINNING;  Surgeon: Myrene GalasMichael [name], MD;  [facility];  Service: Orthopedics;  Laterality: Right;  . TEE WITHOUT CARDIOVERSION N/A 05/14/2016   Procedure: TRANSESOPHAGEAL ECHOCARDIOGRAM (TEE);  Surgeon: Alleen BorneBryan K Bartle, MD;  [facility];  Service: Thoracic;  Laterality: N/A;  . THORACOTOMY/LOBECTOMY Right 04/26/2016   Procedure: Right THORACOTOMY AND DRAINAGE OF EMPYEMA;  Surgeon: Alleen BorneBryan K Bartle, MD;  Location: [facility] OR;  Service: Thoracic;  Laterality: Right;  Marland Kitchen. VIDEO ASSISTED THORACOSCOPY (VATS)/THOROCOTOMY Right 05/14/2016   Procedure: RIGHT VIDEO ASSISTED THORACOSCOPY (VATS),DRAINAGE OF EMPYEMA;  Surgeon: Alleen BorneBryan K Bartle, MD;  Location: [facility] OR;  Service: Thoracic;  Laterality: Right;  . WOUND DEBRIDEMENT N/A 03/01/2016   Procedure: ABDOMINAL WOUND CLOSURE;  Surgeon: Jimmye NormanJames [name], MD;  [facility];  Service: General;  Laterality: N/A;    No Known Allergies     Outpatient Medications Prior to Visit  Medication Sig Dispense Refill  . aspirin 81 MG EC tablet Take 1 tablet (81 mg total) by mouth daily. 100 tablet 0  . atorvastatin (LIPITOR) 20 MG tablet Take 1 tablet (20 mg total) by mouth daily at 6 PM. 30 tablet 3  . diclofenac (VOLTAREN) 75 MG EC tablet Take 1 tablet (75 mg total) by mouth 2 (two) times daily with a meal. 60 tablet 3  . ferrous gluconate (FERGON) 324 MG tablet Take 1 tablet (324 mg total) by mouth 2 (two) times daily with a meal. 60 tablet 0  . FIBER PO Take 1 tablet by mouth daily as needed (fiber supplement).    Marland Kitchen. levETIRAcetam (KEPPRA) 500 MG tablet Take 1 tablet (500 mg total) by mouth 2 (two) times daily. 180 tablet 3  . QUEtiapine (SEROQUEL) 100 MG tablet Take 1 tablet (100 mg total) by mouth at bedtime. 30 tablet 2  . sertraline (ZOLOFT) 100 MG tablet Take 2 tablets (200  mg total) by mouth daily. 60 tablet 2   No facility-administered medications prior to visit.     ROS Review of Systems  Constitutional: Negative for activity change, appetite change and fatigue.  HENT: Negative for congestion, sinus pressure and sore throat.   Eyes: Negative for visual disturbance.  Respiratory: Negative for cough, chest tightness, shortness of breath and wheezing.   Cardiovascular: Negative for chest pain and palpitations.  Gastrointestinal: Negative for abdominal distention, abdominal pain and constipation.  Endocrine: Negative for polydipsia.  Genitourinary: Negative for dysuria and frequency.  Musculoskeletal: Negative for arthralgias and back pain.  Skin: Negative for rash.  Neurological: Negative for tremors, light-headedness and numbness.  Hematological: Does not bruise/bleed easily.  Psychiatric/Behavioral: Negative for agitation and behavioral problems.    Objective:  BP 116/78   Pulse 67   Temp 97.9 F (36.6 C) (Oral)   Ht 6' (1.829 m)   Wt 271 lb 3.2 oz (123 kg)   SpO2 100%   BMI 36.78 kg/m   BP/Weight 10/05/2017 08/02/2017 03/14/2017  Systolic BP 116 113 116  Diastolic  BP 78 80 79  Wt. (Lbs) 271.2 - -  BMI 36.78 - -  Some encounter information is confidential and restricted. Go to Review Flowsheets activity to see all data.      Physical Exam  Constitutional: She is oriented to person, place, and time. She appears well-developed and well-nourished.  Cardiovascular: Normal rate, normal heart sounds and intact distal pulses.  No murmur heard. Pulmonary/Chest: Effort normal and breath sounds normal. She has no wheezes. She has no rales. She exhibits no tenderness.  Abdominal: Soft. Bowel sounds are normal. She exhibits no distension and no mass. There is no tenderness.  Genitourinary:  Genitourinary Comments: External genitalia, vagina, cervix, adnexa - normal  Musculoskeletal: Normal range of motion.  Neurological: She is alert and oriented  to person, place, and time.     Assessment & Plan:   1. Screening for cervical cancer - Cytology - PAP [place]  2. Health care maintenance Counseled on 150 minutes of exercise every week, healthy eating, routine healthcare maintenance. Declines Tdap-states she received this in the last 10 years   No orders of the defined types were placed in this encounter.   Follow-up: Return in about 3 months (around 01/05/2018) for Follow-up of chronic medical conditions.   Jaclyn ShaggyEnobong Amao MD

## 2017-10-10 LAB — CYTOLOGY - PAP
Bacterial vaginitis: NEGATIVE
Candida vaginitis: NEGATIVE
Chlamydia: NEGATIVE
Diagnosis: NEGATIVE
HPV: NOT DETECTED
Neisseria Gonorrhea: NEGATIVE
TRICH (WINDOWPATH): NEGATIVE

## 2017-10-18 ENCOUNTER — Encounter: Payer: Self-pay | Admitting: Physical Therapy

## 2017-10-18 NOTE — Therapy (Signed)
[place] [address] [place], [place], [address] Phone: [phone]   Fax:  [phone]  Patient Details  Name: [name] Bell MRN: [id number] Date of Birth: 25-Jan-1981 Referring Provider:  Dr. Alger Simons  Encounter Date: 10/18/2017   PHYSICAL THERAPY DISCHARGE SUMMARY  Visits from Start of Care: 3  Current functional level related to goals / functional outcomes:  PT Long Term Goals - 08/22/17 1928      PT LONG TERM GOAL #1   Title  Patient will demonstrate independence with updated HEP to address ankle weakness, knee pain/alignment, and gait deviations (Target for all LTGs: by 6th visit--timeframe TBD by MCD)    Baseline  Patient has been doing previous HEP, however now is a member of a gym and needs instruction in updated HEP that she can complete with gym equipment.    Time  6    Period  Weeks    Status  New      PT LONG TERM GOAL #2   Title  Patient will ambulate modified independent with appropriate brace to control overpronation and weak dorsiflexors >1320 ft on level indoor surface (equivalent to full lap at [facility])    Baseline  Patient currently can only walk ~660 ft on level indoor surface (1/2 of outer aisles at [facility]) before knee pain/weakness become unsafe for her to proceed (fears knee buckle and fall).     Time  6    Period  Weeks    Status  New      PT LONG TERM GOAL #3   Title  Patient will ambulate >20 minutes on treadmill at >=2.8 mph with LLE pain <2/10.    Baseline  can walk 15 minutes at max of 2.2 mph and stops due to LLE pain and fatigue    Time  6    Period  Weeks    Status  New         Remaining deficits: Unable to assess due to did not return for last session   Education / Equipment: HEP  Plan: Patient agrees to discharge.  Patient goals were not met. Patient is being discharged due to not returning since the last visit.  ?????        Rexanne Mano,  PT 10/18/2017, 11:48 AM  [place] [address] [place], [place], [address] Phone: [phone]   Fax:  [phone]

## 2017-10-20 ENCOUNTER — Ambulatory Visit (HOSPITAL_COMMUNITY): Payer: Medicaid Other | Admitting: Psychiatry

## 2017-10-24 ENCOUNTER — Telehealth (HOSPITAL_COMMUNITY): Payer: Self-pay | Admitting: Psychiatry

## 2017-10-24 NOTE — Telephone Encounter (Signed)
10/24/17 11:40am Gave patient some information for [facility]sh

## 2017-10-26 ENCOUNTER — Encounter: Payer: Self-pay | Admitting: Physical Medicine & Rehabilitation

## 2017-11-28 ENCOUNTER — Encounter: Payer: Self-pay | Admitting: Physical Medicine & Rehabilitation

## 2017-12-01 ENCOUNTER — Ambulatory Visit (HOSPITAL_COMMUNITY): Payer: Self-pay | Admitting: Psychiatry

## 2018-01-05 ENCOUNTER — Ambulatory Visit: Payer: Medicaid Other | Attending: Family Medicine | Admitting: Family Medicine

## 2018-01-05 ENCOUNTER — Encounter: Payer: Self-pay | Admitting: Family Medicine

## 2018-01-05 ENCOUNTER — Ambulatory Visit: Payer: Medicaid Other | Admitting: Licensed Clinical Social Worker

## 2018-01-05 VITALS — BP 109/75 | HR 79 | Temp 98.1°F | Ht 72.0 in | Wt 279.6 lb

## 2018-01-05 DIAGNOSIS — F339 Major depressive disorder, recurrent, unspecified: Secondary | ICD-10-CM | POA: Insufficient documentation

## 2018-01-05 DIAGNOSIS — M21372 Foot drop, left foot: Secondary | ICD-10-CM | POA: Diagnosis not present

## 2018-01-05 DIAGNOSIS — F909 Attention-deficit hyperactivity disorder, unspecified type: Secondary | ICD-10-CM | POA: Diagnosis not present

## 2018-01-05 DIAGNOSIS — E7800 Pure hypercholesterolemia, unspecified: Secondary | ICD-10-CM | POA: Diagnosis not present

## 2018-01-05 DIAGNOSIS — F4310 Post-traumatic stress disorder, unspecified: Secondary | ICD-10-CM | POA: Insufficient documentation

## 2018-01-05 DIAGNOSIS — Z8673 Personal history of transient ischemic attack (TIA), and cerebral infarction without residual deficits: Secondary | ICD-10-CM | POA: Insufficient documentation

## 2018-01-05 DIAGNOSIS — F332 Major depressive disorder, recurrent severe without psychotic features: Secondary | ICD-10-CM

## 2018-01-05 DIAGNOSIS — Z79899 Other long term (current) drug therapy: Secondary | ICD-10-CM | POA: Insufficient documentation

## 2018-01-05 MED ORDER — ATORVASTATIN CALCIUM 20 MG PO TABS: 20.0000 mg | ORAL_TABLET | Freq: Every day | ORAL | 6 refills | Status: AC

## 2018-01-05 NOTE — BH Specialist Note (Signed)
Integrated Behavioral Health Initial Visit  MRN: [id number] Name: [name] Bell  Number of Integrated Behavioral Health Clinician visits:: 1/6 Session Start time: 10:35 AM  Session End time: 11:15 AM Total time: 40 minutes  Type of Service: Integrated Behavioral Health- Individual/Family Interpretor:No. Interpretor Name and Language: N/A   Warm Hand Off Completed.       SUBJECTIVE: [name] Bell is a 37 y.o. female accompanied by self Patient was referred by Dr. Alvis LemmingsNewlin for depression and anxiety. Patient reports the following symptoms/concerns: ongoing panic attacks, irritability, and overwhelming feelings of worry Duration of problem: Ongoing, Pt reports diagnosis of depression, anxiety, and ptsd since 2002; Severity of problem: moderate  OBJECTIVE: Mood: Anxious and Affect: Appropriate Risk of harm to self or others: No plan to harm self or others  LIFE CONTEXT: Family and Social: Pt resides with sister, who provides strong support School/Work: Pt receives SSI Self-Care: Pt denies substance use Life Changes: [place] She has reports hx of receiving inpatient and outpatient therapy and is interested in obtaining independence. Pt is trying to gain independent housing  GOALS ADDRESSED: Patient will: 1. Reduce symptoms of: anxiety and depression 2. Increase knowledge and/or ability of: coping skills  3. Demonstrate ability to: Increase healthy adjustment to current life circumstances and Increase adequate support systems for patient/family  INTERVENTIONS: Interventions utilized: Solution-Focused Strategies, Supportive Counseling, Psychoeducation and/or Health Education and Link to [employer]  Standardized Assessments completed: GAD-7 and PHQ 2&9  ASSESSMENT: Patient currently experiencing depression and anxiety triggered by psychosocial stressors. She reports ongoing panic attacks, irritability, and overwhelming feelings of worry. Pt  denies SI/HI/AVH. Pt receives strong support from sister; however, is interested in obtaining independent housing and employment.    Patient may benefit from psychotherapy. [facility]. LCSWA discussed therapeutic interventions to assist patient in decreasing symptoms. Pt was provided a listing of counselors that accept Medicaid, housing, and employment resources. Pt completed SCAT application for transportation assistance.   PLAN: 1. Follow up with behavioral health clinician on : Pt was encouraged to contact LCSWA if symptoms worsen or fail to improve to [facility]. 2. Behavioral recommendations: LCSWA recommends that pt apply healthy coping skills discussed, initiate psychotherapy, and utilize provided resources. Pt is encouraged to schedule follow up appointment with LCSWA 3. Referral(s): [employer]:  [employer]s [employer]ousing and Transportation and Counselor 4. "From scale of 1-10, how likely are you to follow plan?": 10/10  Bridgett LarssonJasmine D Rosaly Labarbera, LCSW 01/09/18 4:44 PM

## 2018-01-05 NOTE — Progress Notes (Signed)
Subjective:  Patient ID: [name] Bell, female    DOB: 20-Nov-1980  Age: 37 y.o. MRN: [id number]  CC: No chief complaint on file.   HPI [name] Bell is a 37 year old female with a medical history of  traumatic brain injury with loss of consciousness (secondary to suicide attempt in 05/2016), seizures, multiple rib and pelvic surgeries (status post pinning), abdominal laceration with subsequent bowel resection and wound healing by secondary intention, depression, hyperlipidemia who presents today for a follow-up visit. She is currently under the care of psychiatry for management of her depression but states her therapist left and she is needing a new one but has been compliant with her psychotropic medications and denies suicidal ideations or intents.  She has been compliant with her statin and denies myalgias or other adverse effects. She has noticed intermittent red spots which are generalized but denies itching and spots absent at this time; she is unsure if this is related to changing different beauty products which she has been using.  Of note symptoms improved after she discontinued using multiple products.  She is requesting completion of a SCAT application as her left foot drop and slight left-sided weakness precludes her from walking up hills. She completed physical therapy in 09/2017.  Past Medical History:  Diagnosis Date  . ADD (attention deficit disorder)    has been on medication  . ADHD (attention deficit hyperactivity disorder)   . Anemia   . Anxiety   . Depression   . H/O suicide attempt    at age 64.   Marland Kitchen PTSD (post-traumatic stress disorder)   . Seizures ([place])   . Stroke [facility])     Past Surgical History:  Procedure Laterality Date  . APPLICATION OF WOUND VAC  02/09/2016   Procedure: APPLICATION OF WOUND VAC;  Surgeon: Judeth Horn, MD;  Location: [place];  Service: General;;  . APPLICATION OF WOUND VAC N/A 02/16/2016   Procedure: RE-APPLICATION OF WOUND VAC;   Surgeon: Georganna Skeans, MD;  Location: [place];  Service: General;  Laterality: N/A;  . BOWEL RESECTION  02/09/2016   Procedure: SMALL BOWEL RESECTION, MESENTERIC REPAIR;  Surgeon: Judeth Horn, MD;  Location: [place];  Service: General;;  . CHEST TUBE INSERTION Right 02/11/2016   Procedure: CHEST TUBE INSERTION;  Surgeon: Judeth Horn, MD;  Location: [place];  Service: General;  Laterality: Right;  . CHEST TUBE INSERTION Right 02/26/2016   Procedure: CHEST TUBE INSERTION;  Surgeon: Ivin Poot, MD;  Location: [place];  Service: Thoracic;  Laterality: Right;  . CHOLECYSTECTOMY  02/09/2016   Procedure: CHOLECYSTECTOMY;  Surgeon: Judeth Horn, MD;  Location: [place];  Service: General;;  . ESOPHAGOGASTRODUODENOSCOPY N/A 02/10/2016   Procedure: ESOPHAGOGASTRODUODENOSCOPY (EGD);  Surgeon: Doran Stabler, MD;  Location: [facility] ENDOSCOPY;  Service: Endoscopy;  Laterality: N/A;  . EXTERNAL FIXATION PELVIS  02/09/2016   Procedure: EXTERNAL FIXATION PELVIS;  Surgeon: Altamese [place], MD;  Location: [place];  Service: Orthopedics;;  . HEMATOMA EVACUATION Right 05/14/2016   Procedure: EVACUATION HEMATOMA;  Surgeon: Gaye Pollack, MD;  Location: [place];  Service: Thoracic;  Laterality: Right;  Evacuation of Hematoma Right Chest  . LACERATION REPAIR  02/09/2016   Procedure: REPAIR LIVER LACERATION;  Surgeon: Judeth Horn, MD;  Location: [place];  Service: General;;  . LAPAROTOMY N/A 02/10/2016   Procedure: EXPLORATORY LAPAROTOMY, removal of packs,  cauterization of liver, repacking of liver, and open abdomen vac application;  Surgeon: Georganna Skeans, MD;  Location: [place];  Service: General;  Laterality: N/A;  . LAPAROTOMY N/A 02/11/2016   Procedure: EXPLORATORY LAPAROTOMY VAC CHANGE ;  Surgeon: Judeth Horn, MD;  Location: [place];  Service: General;  Laterality: N/A;  . LAPAROTOMY N/A 02/13/2016   Procedure: EXPLORATORY LAPAROTOMY, REMOVAL OF PACKS, ABDOMINAL VAC DRESSING CHANGE;  Surgeon: Georganna Skeans, MD;  Location: [place];  Service: General;   Laterality: N/A;  . LAPAROTOMY N/A 02/18/2016   Procedure: EXPLORATORY LAPAROTOMY, PLACEMENT OF ABRA ABDOMINAL WALL CLOSURE SET;  Surgeon: Judeth Horn, MD;  Location: [place];  Service: General;  Laterality: N/A;  . LAPAROTOMY N/A 02/09/2016   Procedure: EXPLORATORY LAPAROTOMY;  Surgeon: Judeth Horn, MD;  Location: [place];  Service: General;  Laterality: N/A;  . LAPAROTOMY N/A 02/16/2016   Procedure: EXPLORATORY LAPAROTOMY, ABDOMINAL [place];  Surgeon: Georganna Skeans, MD;  Location: [place];  Service: General;  Laterality: N/A;  . PERCUTANEOUS TRACHEOSTOMY N/A 03/01/2016   Procedure: PERCUTANEOUS TRACHEOSTOMY;  Surgeon: Judeth Horn, MD;  Location: [place];  Service: General;  Laterality: N/A;  Dub Mikes Right 02/16/2016   Procedure: [place];  Surgeon: Altamese [place], MD;  Location: [place];  Service: Orthopedics;  Laterality: Right;  . TEE WITHOUT CARDIOVERSION N/A 05/14/2016   Procedure: TRANSESOPHAGEAL ECHOCARDIOGRAM (TEE);  Surgeon: Gaye Pollack, MD;  Location[facility] OR;  Service: Thoracic;  Laterality: N/A;  . THORACOTOMY/LOBECTOMY Right 04/26/2016   Procedure: Right THORACOTOMY AND DRAINAGE OF EMPYEMA;  Surgeon: Gaye Pollack, MD;  Location: [place];  Service: Thoracic;  Laterality: Right;  Marland Kitchen VIDEO ASSISTED THORACOSCOPY (VATS)/THOROCOTOMY Right 05/14/2016   Procedure: RIGHT VIDEO ASSISTED THORACOSCOPY (VATS),DRAINAGE OF EMPYEMA;  Surgeon: Gaye Pollack, MD;  Location: [place];  Service: Thoracic;  Laterality: Right;  . WOUND DEBRIDEMENT N/A 03/01/2016   Procedure: ABDOMINAL WOUND CLOSURE;  Surgeon: Judeth Horn, MD;  Location: [place];  Service: General;  Laterality: N/A;    No Known Allergies  Outpatient Medications Prior to Visit  Medication Sig Dispense Refill  . aspirin 81 MG EC tablet Take 1 tablet (81 mg total) by mouth daily. 100 tablet 0  . diclofenac (VOLTAREN) 75 MG EC tablet Take 1 tablet (75 mg total) by mouth 2 (two) times daily with a meal. 60 tablet 3  . ferrous gluconate  (FERGON) 324 MG tablet Take 1 tablet (324 mg total) by mouth 2 (two) times daily with a meal. 60 tablet 0  . FIBER PO Take 1 tablet by mouth daily as needed (fiber supplement).    Marland Kitchen levETIRAcetam (KEPPRA) 500 MG tablet Take 1 tablet (500 mg total) by mouth 2 (two) times daily. 180 tablet 3  . QUEtiapine (SEROQUEL) 100 MG tablet Take 1 tablet (100 mg total) by mouth at bedtime. 30 tablet 2  . sertraline (ZOLOFT) 100 MG tablet Take 2 tablets (200 mg total) by mouth daily. 60 tablet 2  . atorvastatin (LIPITOR) 20 MG tablet Take 1 tablet (20 mg total) by mouth daily at 6 PM. 30 tablet 3   No facility-administered medications prior to visit.     ROS Review of Systems  Constitutional: Negative for activity change, appetite change and fatigue.  HENT: Negative for congestion, sinus pressure and sore throat.   Eyes: Negative for visual disturbance.  Respiratory: Negative for cough, chest tightness, shortness of breath and wheezing.   Cardiovascular: Negative for chest pain and palpitations.  Gastrointestinal: Negative for abdominal distention, abdominal pain and constipation.  Endocrine: Negative for polydipsia.  Genitourinary: Negative for dysuria and frequency.  Musculoskeletal: Positive  for gait problem. Negative for arthralgias and back pain.  Skin: Negative for rash.  Neurological: Positive for weakness. Negative for tremors, light-headedness and numbness.  Hematological: Does not bruise/bleed easily.  Psychiatric/Behavioral: Negative for agitation and behavioral problems.    Objective:  BP 109/75   Pulse 79   Temp 98.1 F (36.7 C) (Oral)   Ht 6' (1.829 m)   Wt 279 lb 9.6 oz (126.8 kg)   SpO2 98%   BMI 37.92 kg/m   BP/Weight 01/05/2018 10/05/2017 0/03/1101  Systolic BP 111 735 670  Diastolic BP 75 78 80  Wt. (Lbs) 279.6 271.2 -  BMI 37.92 36.78 -  Some encounter information is confidential and restricted. Go to Review Flowsheets activity to see all data.      Physical Exam    Constitutional: She is oriented to person, place, and time. She appears well-developed and well-nourished.  Cardiovascular: Normal rate, normal heart sounds and intact distal pulses.  No murmur heard. Pulmonary/Chest: Effort normal and breath sounds normal. She has no wheezes. She has no rales. She exhibits no tenderness.  Abdominal: Soft. Bowel sounds are normal. She exhibits no distension and no mass. There is no tenderness.  Musculoskeletal:  L AFO brace in place  Neurological: She is alert and oriented to person, place, and time.  Skin: Skin is warm and dry.  Psychiatric: She has a normal mood and affect.    CMP Latest Ref Rng & Units 07/29/2017 07/06/2016 06/14/2016  Glucose 65 - 99 mg/dL 110(H) 79 119(H)  BUN 6 - 20 mg/dL _0 Creatinine 0.57 - 1.00 mg/dL 0.76 0.69 0.72  Sodium 134 - 144 mmol/L 140 137 140  Potassium 3.5 - 5.2 mmol/L 4.3 4.5 4.1  Chloride 96 - 106 mmol/L 105 103 110  CO2 20 - 29 mmol/L 17(L) 26 21(L)  Calcium 8.7 - 10.2 mg/dL 9.1 10.1 9.7  Total Protein 6.0 - 8.5 g/dL 7.1 7.3 -  Total Bilirubin 0.0 - 1.2 mg/dL 0.4 0.6 -  Alkaline Phos 39 - 117 IU/L 62 71 -  AST 0 - 40 IU/L 16 31 -  ALT 0 - 32 IU/L 9 19 -    Lipid Panel     Component Value Date/Time   CHOL 180 05/11/2016 0518   TRIG 108 05/11/2016 0518   HDL 46 05/11/2016 0518   CHOLHDL 3.9 05/11/2016 0518   VLDL 22 05/11/2016 0518   LDLCALC 112 (H) 05/11/2016 0518    Assessment & Plan:   1. Pure hypercholesterolemia Controlled Low-cholesterol diet - CMP14+EGFR - Lipid panel - atorvastatin (LIPITOR) 20 MG tablet; Take 1 tablet (20 mg total) by mouth daily at 6 PM.  Dispense: 30 tablet; Refill: 6  2. Left foot drop Completed PT Use AFO brace  3. Major depressive disorder, recurrent severe without psychotic features ([place]) Stable Continue Seroquel, Zoloft Follow-up with mental health LCSW called in to see the patient to assist with referral for counseling   Meds ordered this encounter   Medications  . atorvastatin (LIPITOR) 20 MG tablet    Sig: Take 1 tablet (20 mg total) by mouth daily at 6 PM.    Dispense:  30 tablet    Refill:  6    Follow-up: Return in about 6 months (around 07/05/2018) for Follow-up of chronic medical conditions.   Charlott Rakes MD

## 2018-01-05 NOTE — Patient Instructions (Signed)

## 2018-01-06 LAB — LIPID PANEL
CHOLESTEROL TOTAL: 217 mg/dL — AB (ref 100–199)
Chol/HDL Ratio: 4.2 ratio (ref 0.0–4.4)
HDL: 52 mg/dL (ref >39)
LDL Calculated: 138 mg/dL — ABNORMAL HIGH (ref 0–99)
TRIGLYCERIDES: 134 mg/dL (ref 0–149)
VLDL Cholesterol Cal: 27 mg/dL (ref 5–40)

## 2018-01-06 LAB — CMP14+EGFR
A/G RATIO: 2.1 (ref 1.2–2.2)
ALK PHOS: 51 IU/L (ref 39–117)
ALT: 11 IU/L (ref 0–32)
AST: 11 IU/L (ref 0–40)
Albumin: 4.5 g/dL (ref 3.5–5.5)
BUN/Creatinine Ratio: 13 (ref 9–23)
BUN: 11 mg/dL (ref 6–20)
Bilirubin Total: 0.5 mg/dL (ref 0.0–1.2)
CALCIUM: 9.8 mg/dL (ref 8.7–10.2)
CO2: 22 mmol/L (ref 20–29)
CREATININE: 0.83 mg/dL (ref 0.57–1.00)
Chloride: 102 mmol/L (ref 96–106)
GFR calc Af Amer: 106 mL/min/{1.73_m2} (ref >59)
GFR calc non Af Amer: 92 mL/min/{1.73_m2} (ref >59)
GLOBULIN, TOTAL: 2.1 g/dL (ref 1.5–4.5)
Glucose: 93 mg/dL (ref 65–99)
POTASSIUM: 4.4 mmol/L (ref 3.5–5.2)
SODIUM: 140 mmol/L (ref 134–144)
Total Protein: 6.6 g/dL (ref 6.0–8.5)

## 2018-01-12 ENCOUNTER — Encounter: Payer: Self-pay | Admitting: Licensed Clinical Social Worker

## 2018-01-12 NOTE — Progress Notes (Signed)
Referral to SCAT completed and faxed to SCAT Eligibility Department.

## 2018-01-26 ENCOUNTER — Telehealth: Payer: Self-pay | Admitting: Family Medicine

## 2018-01-26 NOTE — Telephone Encounter (Signed)
Call placed to patient regarding her SCAT application. Spoke with patient and she stated that she received her card and she was approved for the services until April of 2020.

## 2018-03-06 ENCOUNTER — Ambulatory Visit: Payer: Medicaid Other | Admitting: Neurology

## 2018-07-31 ENCOUNTER — Encounter: Payer: Self-pay | Admitting: Family Medicine

## 2025-01-24 ENCOUNTER — Ambulatory Visit: Payer: Self-pay | Admitting: Family Medicine
